# Patient Record
Sex: Female | Born: 1945
Health system: Southern US, Community
[De-identification: ages and names within clinical notes are randomized; demographics above are authoritative.]

## PROBLEM LIST (undated history)

## (undated) DIAGNOSIS — Z8042 Family history of malignant neoplasm of prostate: Secondary | ICD-10-CM

## (undated) DIAGNOSIS — T7840XA Allergy, unspecified, initial encounter: Secondary | ICD-10-CM

## (undated) DIAGNOSIS — M1990 Unspecified osteoarthritis, unspecified site: Secondary | ICD-10-CM

## (undated) DIAGNOSIS — F32A Depression, unspecified: Secondary | ICD-10-CM

## (undated) DIAGNOSIS — I509 Heart failure, unspecified: Secondary | ICD-10-CM

## (undated) DIAGNOSIS — G4730 Sleep apnea, unspecified: Secondary | ICD-10-CM

## (undated) DIAGNOSIS — N739 Female pelvic inflammatory disease, unspecified: Secondary | ICD-10-CM

## (undated) DIAGNOSIS — F419 Anxiety disorder, unspecified: Secondary | ICD-10-CM

## (undated) DIAGNOSIS — C55 Malignant neoplasm of uterus, part unspecified: Secondary | ICD-10-CM

## (undated) DIAGNOSIS — J690 Pneumonitis due to inhalation of food and vomit: Secondary | ICD-10-CM

## (undated) DIAGNOSIS — C800 Disseminated malignant neoplasm, unspecified: Secondary | ICD-10-CM

## (undated) DIAGNOSIS — I719 Aortic aneurysm of unspecified site, without rupture: Secondary | ICD-10-CM

## (undated) DIAGNOSIS — Z8049 Family history of malignant neoplasm of other genital organs: Secondary | ICD-10-CM

## (undated) DIAGNOSIS — E785 Hyperlipidemia, unspecified: Secondary | ICD-10-CM

## (undated) DIAGNOSIS — Z8719 Personal history of other diseases of the digestive system: Secondary | ICD-10-CM

## (undated) DIAGNOSIS — J449 Chronic obstructive pulmonary disease, unspecified: Secondary | ICD-10-CM

## (undated) DIAGNOSIS — F329 Major depressive disorder, single episode, unspecified: Secondary | ICD-10-CM

## (undated) DIAGNOSIS — Z803 Family history of malignant neoplasm of breast: Secondary | ICD-10-CM

## (undated) DIAGNOSIS — I341 Nonrheumatic mitral (valve) prolapse: Secondary | ICD-10-CM

## (undated) DIAGNOSIS — I10 Essential (primary) hypertension: Secondary | ICD-10-CM

## (undated) DIAGNOSIS — J9621 Acute and chronic respiratory failure with hypoxia: Secondary | ICD-10-CM

## (undated) HISTORY — DX: Aortic aneurysm of unspecified site, without rupture: I71.9

## (undated) HISTORY — DX: Major depressive disorder, single episode, unspecified: F32.9

## (undated) HISTORY — DX: Family history of malignant neoplasm of breast: Z80.3

## (undated) HISTORY — DX: Personal history of other diseases of the digestive system: Z87.19

## (undated) HISTORY — PX: CERVICAL SPINE SURGERY: SHX589

## (undated) HISTORY — DX: Malignant neoplasm of uterus, part unspecified: C55

## (undated) HISTORY — DX: Allergy, unspecified, initial encounter: T78.40XA

## (undated) HISTORY — DX: Nonrheumatic mitral (valve) prolapse: I34.1

## (undated) HISTORY — DX: Unspecified osteoarthritis, unspecified site: M19.90

## (undated) HISTORY — DX: Family history of malignant neoplasm of other genital organs: Z80.49

## (undated) HISTORY — PX: OTHER SURGICAL HISTORY: SHX169

## (undated) HISTORY — DX: Essential (primary) hypertension: I10

## (undated) HISTORY — DX: Depression, unspecified: F32.A

## (undated) HISTORY — DX: Anxiety disorder, unspecified: F41.9

## (undated) HISTORY — PX: ABDOMINAL HYSTERECTOMY: SHX81

## (undated) HISTORY — DX: Family history of malignant neoplasm of prostate: Z80.42

## (undated) HISTORY — PX: CEREBRAL ANEURYSM REPAIR: SHX164

---

## 1969-06-08 HISTORY — PX: PARTIAL HYSTERECTOMY: SHX80

## 2017-09-28 ENCOUNTER — Telehealth: Payer: Self-pay | Admitting: *Deleted

## 2017-09-28 NOTE — Telephone Encounter (Signed)
Called and spoke with Gerri at [facility], gave the appt for tomorrow at 1:15pm. She will call the patient

## 2017-09-29 ENCOUNTER — Inpatient Hospital Stay: Payer: Medicare PPO

## 2017-09-29 ENCOUNTER — Telehealth: Payer: Self-pay | Admitting: *Deleted

## 2017-09-29 ENCOUNTER — Encounter: Payer: Self-pay | Admitting: Oncology

## 2017-09-29 ENCOUNTER — Inpatient Hospital Stay: Payer: Medicare PPO | Attending: Obstetrics | Admitting: Obstetrics

## 2017-09-29 ENCOUNTER — Encounter: Payer: Self-pay | Admitting: *Deleted

## 2017-09-29 VITALS — BP 114/69 | HR 85 | Temp 98.6°F | Resp 20 | Ht 63.0 in | Wt 118.6 lb

## 2017-09-29 DIAGNOSIS — C801 Malignant (primary) neoplasm, unspecified: Secondary | ICD-10-CM | POA: Insufficient documentation

## 2017-09-29 DIAGNOSIS — C800 Disseminated malignant neoplasm, unspecified: Secondary | ICD-10-CM | POA: Diagnosis not present

## 2017-09-29 DIAGNOSIS — R1030 Lower abdominal pain, unspecified: Secondary | ICD-10-CM

## 2017-09-29 DIAGNOSIS — F17210 Nicotine dependence, cigarettes, uncomplicated: Secondary | ICD-10-CM | POA: Diagnosis not present

## 2017-09-29 DIAGNOSIS — C786 Secondary malignant neoplasm of retroperitoneum and peritoneum: Secondary | ICD-10-CM | POA: Insufficient documentation

## 2017-09-29 DIAGNOSIS — C569 Malignant neoplasm of unspecified ovary: Secondary | ICD-10-CM

## 2017-09-29 LAB — BASIC METABOLIC PANEL
Anion gap: 12 — ABNORMAL HIGH (ref 3–11)
BUN: 10 mg/dL (ref 7–26)
CHLORIDE: 99 mmol/L (ref 98–109)
CO2: 26 mmol/L (ref 22–29)
CREATININE: 0.74 mg/dL (ref 0.60–1.10)
Calcium: 9.2 mg/dL (ref 8.4–10.4)
GFR calc Af Amer: >60 mL/min (ref >60)
GFR calc non Af Amer: >60 mL/min (ref >60)
Glucose, Bld: 103 mg/dL (ref 70–140)
Potassium: 4 mmol/L (ref 3.5–5.1)
Sodium: 137 mmol/L (ref 136–145)

## 2017-09-29 LAB — PREALBUMIN: Prealbumin: 5.1 mg/dL — ABNORMAL LOW (ref 18–38)

## 2017-09-29 MED ORDER — HYDROCODONE-ACETAMINOPHEN 5-325 MG PO TABS: 1.0000 | ORAL_TABLET | Freq: Four times a day (QID) | ORAL | 0 refills | Status: DC | PRN

## 2017-09-29 NOTE — Patient Instructions (Addendum)
1. We will set up a biopsy of your omentum 2. You may keep your paracentesis appointment in [place]. 3. You will be set up to see Dr. Alvy Bimler for chemo discussion. 4. CT Scan is en-route for review. 5. RTC in next 2-3 weeks to followup on planning. We will try to get this in with Dr. Skeet Latch if possible.

## 2017-09-29 NOTE — Telephone Encounter (Signed)
Fax request for CD of scan to [facility] imaging

## 2017-09-30 ENCOUNTER — Ambulatory Visit
Admission: RE | Admit: 2017-09-30 | Discharge: 2017-09-30 | Disposition: A | Discharge status: Home / Self Care | Payer: Self-pay | Source: Ambulatory Visit | Attending: Gynecologic Oncology | Admitting: Gynecologic Oncology

## 2017-09-30 ENCOUNTER — Other Ambulatory Visit: Payer: Self-pay | Admitting: Gynecologic Oncology

## 2017-09-30 ENCOUNTER — Ambulatory Visit: Payer: PRIVATE HEALTH INSURANCE

## 2017-09-30 DIAGNOSIS — C569 Malignant neoplasm of unspecified ovary: Secondary | ICD-10-CM

## 2017-10-01 ENCOUNTER — Other Ambulatory Visit: Payer: Self-pay | Admitting: Gynecologic Oncology

## 2017-10-01 ENCOUNTER — Inpatient Hospital Stay
Admission: RE | Admit: 2017-10-01 | Discharge: 2017-10-01 | Disposition: A | Discharge status: Home / Self Care | Payer: Self-pay | Source: Ambulatory Visit | Attending: Gynecologic Oncology | Admitting: Gynecologic Oncology

## 2017-10-01 DIAGNOSIS — C801 Malignant (primary) neoplasm, unspecified: Secondary | ICD-10-CM

## 2017-10-04 ENCOUNTER — Telehealth: Payer: Self-pay | Admitting: *Deleted

## 2017-10-04 ENCOUNTER — Telehealth: Payer: Self-pay | Admitting: Obstetrics

## 2017-10-04 NOTE — Telephone Encounter (Signed)
Called and spoke with the patient, gave the appt with Dr. Alvy Bimler on May 3rd at 2pm.

## 2017-10-05 ENCOUNTER — Encounter: Payer: Self-pay | Admitting: Obstetrics

## 2017-10-05 DIAGNOSIS — C482 Malignant neoplasm of peritoneum, unspecified: Secondary | ICD-10-CM | POA: Insufficient documentation

## 2017-10-05 NOTE — Progress Notes (Signed)
Consult Note: Gyn-Onc  Consult was requested by Dr. Kayleen Memos in [place] for the evaluation of Jo Parker 72 y.o. female  CC:  Chief Complaint  Patient presents with  . malignant neoplasm of peritoneum    HPI: Jo Parker  is a very nice 72 y.o.  P3  March 2019 she had a abdominal ultrasound for her history of aortic aneurysm there was an incidental finding of a cirrhotic appearing liver and ascites.  Initially treated with diuretics but after lack of improvement further work-up was performed including a CT scan of the abdomen and pelvis.  As noted below etiology was significant carcinomatosis with peritoneal thickening sigmoid colon with significant thickening new pancreatic cyst compared to a 2018 scan and omental caking.  Seen by medical oncology [place] which is where she lives.  Paracentesis was ordered along with tumor markers including CA 125 which was noted to be elevated CA-19-9 which was mildly elevated normal being 35 the cytology on the paracentesis was positive for adenocarcinoma IHC staining suggested a GYN primary CK 7 and Ca1 25 being positive then CEA CK 20 and CA-19-9 being negative.  In retrospect the patient admits to approximately 5 months of symptoms.  Initially this presented as a pulled muscle when she has been noticing decreased appetite weight loss, noticed a significant abdominal distention.  About 1 month ago did have an episode of nausea and vomiting.  She does notice nausea about 1-2 times per week.   She is referred to [name] here in she has previously had medical procedures and stayed with these children. She presents with these 2 sons and their wives, her husband, and a nephew.  Measurement of disease:  No results for input(s): CA125, CAN125, CEA, CA199, ESTRADIOL, INHBB in the last 8760 hours.  Invalid input(s): INHIBINA  CA125 . 09/13/17 = 597.9    Radiology: . 08/18/2017-abdominal ultrasound-stable aneurysm 3.8 cm heterogeneous  liver likely cirrhosis and associated moderate ascites.  Bilateral . 09/03/2017--CT chest-[place]-small bilateral pleural effusions.  Upper abdomen shows large amount of ascites . 09/16/17 - CT AP - [place] -most of the pancreas looks normal.  Along the pancreatic tail there is a new 2.6 cm ovoid cystic mass.  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  Marked sigmoid wall thickening.  Ascites throughout the abdomen; perihepatic, perisplenic, paracolic, pelvic peripheral peritoneal thickening in the pelvis with marked omental caking.  Specifically no pelvic mass is noted.  The images were not available for review on the patient's initial visit    Oncologic History: as in HPI    No history exists.    Current Meds:  Outpatient Encounter Medications as of 09/29/2017  Medication Sig  . aspirin EC 81 MG tablet Take 81 mg by mouth daily.  Marland Kitchen atorvastatin (LIPITOR) 80 MG tablet Take 80 mg by mouth daily.  . budesonide-formoterol (SYMBICORT) 160-4.5 MCG/ACT inhaler Inhale 2 puffs into the lungs 2 (two) times daily. Use two puffs by mouth twice daily(rinse mouth after use)  . citalopram (CELEXA) 40 MG tablet Take 40 mg by mouth daily.  Marland Kitchen HYDROcodone-acetaminophen (NORCO/VICODIN) 5-325 MG tablet Take 1-2 tablets by mouth every 6 (six) hours as needed for moderate pain.  . meclizine (ANTIVERT) 25 MG tablet Take 25 mg by mouth 3 (three) times daily as needed for dizziness.  . Melatonin 5 MG CAPS Take 2 capsules by mouth at bedtime as needed (sleep). Take 2 capsules at bedtime as needed  . [DISCONTINUED] carvedilol (COREG)  12.5 MG tablet TK 1 T PO BID  . [DISCONTINUED] furosemide (LASIX) 20 MG tablet TK 1 T PO D UTD  . [DISCONTINUED] HYDROcodone-acetaminophen (NORCO/VICODIN) 5-325 MG tablet TK 1 TO 2 TS PO Q 6 H PRN P  . [DISCONTINUED] ondansetron (ZOFRAN) 4 MG tablet TK 1 T PO Q 6 H PRN N   No facility-administered encounter medications on file as of 09/29/2017.     Allergy: No Known  Allergies  Social Hx:   Social History   Socioeconomic History  . Marital status: Married    Spouse name: Not on file  . Number of children: Not on file  . Years of education: Not on file  . Highest education level: Not on file  Occupational History  . Not on file  Social Needs  . Financial resource strain: Not on file  . Food insecurity:    Worry: Not on file    Inability: Not on file  . Transportation needs:    Medical: Not on file    Non-medical: Not on file  Tobacco Use  . Smoking status: Current Some Day Smoker    Packs/day: 1.00    Years: 30.00    Pack years: 30.00  . Smokeless tobacco: Never Used  Substance and Sexual Activity  . Alcohol use: Yes    Comment: 1 / week  . Drug use: Never  . Sexual activity: Not on file  Lifestyle  . Physical activity:    Days per week: Not on file    Minutes per session: Not on file  . Stress: Not on file  Relationships  . Social connections:    Talks on phone: Not on file    Gets together: Not on file    Attends religious service: Not on file    Active member of club or organization: Not on file    Attends meetings of clubs or organizations: Not on file    Relationship status: Not on file  . Intimate partner violence:    Fear of current or ex partner: Not on file    Emotionally abused: Not on file    Physically abused: Not on file    Forced sexual activity: Not on file  Other Topics Concern  . Not on file  Social History Narrative  . Not on file    Past Surgical Hx:  Past Surgical History:  Procedure Laterality Date  . CEREBRAL ANEURYSM REPAIR    . CERVICAL SPINE SURGERY     bone spurs  . PARTIAL HYSTERECTOMY  1971   "precancer"    Past Medical Hx:  Past Medical History:  Diagnosis Date  . Anxiety and depression   . Aortic aneurysm ([place])   . Arthritis   . History of diverticulitis   . Hypercholesterolemia   . Hypertension   . Mitral valve prolapse     Past Gynecological History:   GYNECOLOGIC  HISTORY:  No LMP recorded. Menarche: 73 years old P 3 LMP 1977 Contraceptive OCP x8 years HRT none  Last Pap N/A  Family Hx:  Family History  Problem Relation Age of Onset  . Endometrial cancer Sister   . Lung cancer Paternal Aunt   . Breast cancer Paternal Aunt   . Breast cancer Paternal Aunt   . Stomach cancer Maternal Grandfather   . Prostate cancer Paternal Uncle   . Breast cancer Cousin   . Breast cancer Cousin     Review of Systems:  Review of Systems  Constitutional: Positive for appetite  change, chills, fatigue and unexpected weight change.  HENT:   Positive for tinnitus.   Eyes: Negative.   Respiratory: Positive for cough and shortness of breath.   Cardiovascular: Positive for palpitations.  Gastrointestinal: Positive for abdominal distention, abdominal pain, constipation, nausea and vomiting.  Endocrine: Negative.   Genitourinary: Positive for pelvic pain.   Musculoskeletal: Positive for back pain.  Skin: Negative.   Neurological: Negative.   Hematological: Negative.   Psychiatric/Behavioral: Positive for depression. The patient is nervous/anxious.     Vitals:  Blood pressure 114/69, pulse 85, temperature 98.6 F (37 C), temperature source Oral, resp. rate 20, height 5\' 3"  (1.6 m), weight 118 lb 9.6 oz (53.8 kg), SpO2 98 %. Body mass index is 21.01 kg/m.   Physical Exam:   General :  [place]  72 y.o., female in no apparent distress HEENT:  Normocephalic/atraumatic, symmetric, EOMI, eyelids normal Neck:   Supple, no masses.  Lymphatics:  No cervical/ submandibular/ supraclavicular/ infraclavicular/ inguinal adenopathy Respiratory:  Respirations unlabored, no use of accessory muscles CV:   Deferred Breast:  Deferred Musculoskeletal: No CVA tenderness, normal muscle strength. Abdomen:  Soft, non-tender, distended. No evidence of hernia. No masses. Extremities:  No lymphedema, no erythema, non-tender. Skin:   Normal inspection Neuro/Psych:  No focal  motor deficit, no abnormal mental status. Normal gait. Normal affect. Alert and oriented to person, place, and time  Genito Urinary: Vulva: Normal external female genitalia.  Bladder/urethra: Urethral meatus normal in size and location. No lesions or   masses, well supported bladder Bimanual exam:  Cervix/Uterus: surgically absent  Adnexa: No masses. Rectovaginal:  Good tone, mild narrowing of the rectum, + cul de sac nodularity starting at about 7cm from the anal verge consistent with disease along anterior recto-sigmoid. No mucosal breakthrough. No parametrial involvement or nodularity.  Oncologic Summary: 1. 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  Her genetic testing from [place] is pending. 2. Management discussion o We reviewed neoadjuvant versus primary debulking o With the images unavailable for review it is difficult to say with certainty whether primary debulking would be recommended however based on the patient's current disease description on the imaging report and her apparent malnourished state I would favor neoadjuvant approach. o We talked about the social constraints around this approach and the importance of coordinating timing of chemotherapy and surgery.  Her family would like her to stay in [place] to facilitate treatment. i. She may be able to complete the adjuvant portion of the chemo in [place]. I defer that decision/discussion to Dr. Alvy Bimler. o Although the cytology IHC staining favors a GYN primary I recommended a core biopsy of the omental cake to determine histology o She will be referred to Dr. Alvy Bimler for chemotherapy discussion o She was initially hoping to reconsult with Dr. Skeet Latch who treated a family  friend/relative 3. Pending items o CT images will be obtained from the outside facility and should the disease appear to be resectable with reasonable morbidity we may change the recommendation to be primary debulking o Prealbumin will be ordered.  I am concerned she is quite malnourished o Genetics from the outlying facility will need to be followed up. o Importantly a core biopsy will be ordered through interventional radiology 4. Symptom management  Her nausea is intermittent and occurs 1-2 times per  week.  At this time expectant management is reasonable however I do worry about her bowels in general I would like to expedite the visit to medical oncology.  Abdominal distention and ascites; a paracentesis has been ordered in [place] at this coming Friday and I think it would be reasonable to keep that appointment.  She may need another paracentesis here in [place] prior to her first chemotherapy treatment and I did ask the family to let us know if they would want [name] to order that for her 5. RTC 2-3 weeks with Dr. Skeet Latch or myself for continued counseling/coordination of care.  Isabel Caprice, MD  10/05/2017, 4:36 PM  Cc: Doran Clay, MD, [place] [facility]cologist Toney Reil, MD, [place]

## 2017-10-06 DIAGNOSIS — C800 Disseminated malignant neoplasm, unspecified: Secondary | ICD-10-CM

## 2017-10-06 HISTORY — DX: Disseminated malignant neoplasm, unspecified: C80.0

## 2017-10-07 ENCOUNTER — Telehealth: Payer: Self-pay

## 2017-10-07 ENCOUNTER — Other Ambulatory Visit: Payer: Self-pay | Admitting: Student

## 2017-10-07 NOTE — Telephone Encounter (Signed)
Told patient and son that the CT guided biopsy was moved to tomorrow 10-08-17 at [facility] radiology.  She needs to register in radiology at 0700. Dr Calton Dach appointment is at 2 pm tomorrow as previously scheduled. Pt and son verbalized understanding.

## 2017-10-08 ENCOUNTER — Encounter: Payer: Self-pay | Admitting: Hematology and Oncology

## 2017-10-08 ENCOUNTER — Encounter: Payer: Self-pay | Admitting: Oncology

## 2017-10-08 ENCOUNTER — Inpatient Hospital Stay: Payer: Medicare PPO | Attending: Hematology and Oncology | Admitting: Hematology and Oncology

## 2017-10-08 ENCOUNTER — Other Ambulatory Visit: Payer: Self-pay | Admitting: Hematology and Oncology

## 2017-10-08 ENCOUNTER — Encounter (HOSPITAL_COMMUNITY): Payer: Self-pay

## 2017-10-08 ENCOUNTER — Telehealth: Payer: Self-pay | Admitting: Hematology and Oncology

## 2017-10-08 ENCOUNTER — Telehealth: Payer: Self-pay | Admitting: Oncology

## 2017-10-08 ENCOUNTER — Ambulatory Visit (HOSPITAL_COMMUNITY)
Admission: RE | Admit: 2017-10-08 | Discharge: 2017-10-08 | Disposition: A | Discharge status: Home / Self Care | Payer: Medicare PPO | Source: Ambulatory Visit | Attending: Gynecologic Oncology | Admitting: Gynecologic Oncology

## 2017-10-08 DIAGNOSIS — R188 Other ascites: Secondary | ICD-10-CM | POA: Insufficient documentation

## 2017-10-08 DIAGNOSIS — Z7982 Long term (current) use of aspirin: Secondary | ICD-10-CM | POA: Insufficient documentation

## 2017-10-08 DIAGNOSIS — F17210 Nicotine dependence, cigarettes, uncomplicated: Secondary | ICD-10-CM | POA: Insufficient documentation

## 2017-10-08 DIAGNOSIS — D509 Iron deficiency anemia, unspecified: Secondary | ICD-10-CM | POA: Diagnosis not present

## 2017-10-08 DIAGNOSIS — K5909 Other constipation: Secondary | ICD-10-CM | POA: Insufficient documentation

## 2017-10-08 DIAGNOSIS — E7800 Pure hypercholesterolemia, unspecified: Secondary | ICD-10-CM | POA: Insufficient documentation

## 2017-10-08 DIAGNOSIS — I10 Essential (primary) hypertension: Secondary | ICD-10-CM | POA: Insufficient documentation

## 2017-10-08 DIAGNOSIS — C786 Secondary malignant neoplasm of retroperitoneum and peritoneum: Secondary | ICD-10-CM | POA: Diagnosis not present

## 2017-10-08 DIAGNOSIS — Z5111 Encounter for antineoplastic chemotherapy: Secondary | ICD-10-CM | POA: Insufficient documentation

## 2017-10-08 DIAGNOSIS — I341 Nonrheumatic mitral (valve) prolapse: Secondary | ICD-10-CM | POA: Insufficient documentation

## 2017-10-08 DIAGNOSIS — Z90710 Acquired absence of both cervix and uterus: Secondary | ICD-10-CM | POA: Diagnosis not present

## 2017-10-08 DIAGNOSIS — Z8042 Family history of malignant neoplasm of prostate: Secondary | ICD-10-CM | POA: Insufficient documentation

## 2017-10-08 DIAGNOSIS — F419 Anxiety disorder, unspecified: Secondary | ICD-10-CM | POA: Diagnosis not present

## 2017-10-08 DIAGNOSIS — F329 Major depressive disorder, single episode, unspecified: Secondary | ICD-10-CM | POA: Diagnosis not present

## 2017-10-08 DIAGNOSIS — C482 Malignant neoplasm of peritoneum, unspecified: Secondary | ICD-10-CM | POA: Diagnosis present

## 2017-10-08 DIAGNOSIS — Z720 Tobacco use: Secondary | ICD-10-CM | POA: Insufficient documentation

## 2017-10-08 DIAGNOSIS — E43 Unspecified severe protein-calorie malnutrition: Secondary | ICD-10-CM

## 2017-10-08 DIAGNOSIS — Z800 Family history of malignant neoplasm of digestive organs: Secondary | ICD-10-CM | POA: Diagnosis not present

## 2017-10-08 DIAGNOSIS — Z9889 Other specified postprocedural states: Secondary | ICD-10-CM | POA: Diagnosis not present

## 2017-10-08 DIAGNOSIS — R180 Malignant ascites: Secondary | ICD-10-CM

## 2017-10-08 DIAGNOSIS — Z803 Family history of malignant neoplasm of breast: Secondary | ICD-10-CM | POA: Insufficient documentation

## 2017-10-08 DIAGNOSIS — Z79899 Other long term (current) drug therapy: Secondary | ICD-10-CM | POA: Diagnosis not present

## 2017-10-08 DIAGNOSIS — C569 Malignant neoplasm of unspecified ovary: Secondary | ICD-10-CM | POA: Diagnosis not present

## 2017-10-08 DIAGNOSIS — G893 Neoplasm related pain (acute) (chronic): Secondary | ICD-10-CM | POA: Diagnosis not present

## 2017-10-08 DIAGNOSIS — Z801 Family history of malignant neoplasm of trachea, bronchus and lung: Secondary | ICD-10-CM | POA: Diagnosis not present

## 2017-10-08 DIAGNOSIS — R1030 Lower abdominal pain, unspecified: Secondary | ICD-10-CM

## 2017-10-08 LAB — CBC
HCT: 30.2 % — ABNORMAL LOW (ref 36.0–46.0)
Hemoglobin: 9.5 g/dL — ABNORMAL LOW (ref 12.0–15.0)
MCH: 19.1 pg — ABNORMAL LOW (ref 26.0–34.0)
MCHC: 31.5 g/dL (ref 30.0–36.0)
MCV: 60.8 fL — AB (ref 78.0–100.0)
PLATELETS: 503 10*3/uL — AB (ref 150–400)
RBC: 4.97 MIL/uL (ref 3.87–5.11)
RDW: 16 % — AB (ref 11.5–15.5)
WBC: 5.6 10*3/uL (ref 4.0–10.5)

## 2017-10-08 LAB — PROTIME-INR
INR: 1.22
Prothrombin Time: 15.3 seconds — ABNORMAL HIGH (ref 11.4–15.2)

## 2017-10-08 LAB — APTT: aPTT: 32 seconds (ref 24–36)

## 2017-10-08 MED ORDER — FENTANYL CITRATE (PF) 100 MCG/2ML IJ SOLN
INTRAMUSCULAR | Status: AC
  Filled 2017-10-08: qty 2

## 2017-10-08 MED ORDER — GELATIN ABSORBABLE 12-7 MM EX MISC
CUTANEOUS | Status: AC
  Filled 2017-10-08: qty 1

## 2017-10-08 MED ORDER — SODIUM CHLORIDE 0.9 % IV SOLN: INTRAVENOUS | Status: DC

## 2017-10-08 MED ORDER — NALOXONE HCL 0.4 MG/ML IJ SOLN
INTRAMUSCULAR | Status: AC
  Filled 2017-10-08: qty 1

## 2017-10-08 MED ORDER — FENTANYL CITRATE (PF) 100 MCG/2ML IJ SOLN
INTRAMUSCULAR | Status: AC | PRN
  Administered 2017-10-08 (×2): 25 ug via INTRAVENOUS

## 2017-10-08 MED ORDER — MIDAZOLAM HCL 2 MG/2ML IJ SOLN
INTRAMUSCULAR | Status: AC | PRN
  Administered 2017-10-08: 1 mg via INTRAVENOUS

## 2017-10-08 MED ORDER — HYDROCODONE-ACETAMINOPHEN 10-325 MG PO TABS: 1.0000 | ORAL_TABLET | ORAL | 0 refills | Status: DC | PRN

## 2017-10-08 MED ORDER — MIDAZOLAM HCL 2 MG/2ML IJ SOLN
INTRAMUSCULAR | Status: AC
  Filled 2017-10-08: qty 2

## 2017-10-08 MED ORDER — LIDOCAINE HCL 1 % IJ SOLN
INTRAMUSCULAR | Status: AC
  Filled 2017-10-08: qty 20

## 2017-10-08 MED ORDER — FLUMAZENIL 0.5 MG/5ML IV SOLN
INTRAVENOUS | Status: AC
  Filled 2017-10-08: qty 5

## 2017-10-08 MED FILL — HYDROCODON-APAP 10-325: 10-325 | 7 days supply | Qty: 60 | Fill #0

## 2017-10-08 NOTE — Sedation Documentation (Signed)
Patient is resting comfortably. 

## 2017-10-08 NOTE — Progress Notes (Signed)
START ON PATHWAY REGIMEN - Ovarian     A cycle is every 21 days:     Paclitaxel      Carboplatin   **Always confirm dose/schedule in your pharmacy ordering system**    Patient Characteristics: Newly Diagnosed, Neoadjuvant Therapy Therapeutic Status: Newly Diagnosed AJCC T Category: TX AJCC N Category: N0 AJCC M Category: M1 AJCC 8 Stage Grouping: IV BRCA Mutation Status: Did Not Order Test Intent of Therapy: Curative Intent, Discussed with Patient

## 2017-10-08 NOTE — Assessment & Plan Note (Signed)
She has poorly controlled cancer pain I recommend increasing the dose of her pain medicine and the frequency I warned her about risk of sedation, nausea and constipation I will reassess pain control next week

## 2017-10-08 NOTE — Assessment & Plan Note (Signed)
She have severe iron deficiency anemia She will benefit from iron replacement therapy next week

## 2017-10-08 NOTE — Assessment & Plan Note (Signed)
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

## 2017-10-08 NOTE — Assessment & Plan Note (Signed)
I spent some time counseling the patient the importance of tobacco cessation.  she appears motivated to quit.  

## 2017-10-08 NOTE — Assessment & Plan Note (Addendum)
She have severe protein calorie malnutrition due to untreated malignancy I will consult dietitian for visit We discussed frequent small meals

## 2017-10-08 NOTE — Discharge Instructions (Addendum)
Needle Biopsy, Care After °Refer to this sheet in the next few weeks. These instructions provide you with information about caring for yourself after your procedure. Your health care provider may also give you more specific instructions. Your treatment has been planned according to current medical practices, but problems sometimes occur. Call your health care provider if you have any problems or questions after your procedure. °What can I expect after the procedure? °After your procedure, it is common to have soreness, bruising, or mild pain at the biopsy site. This should go away in a few days. °Follow these instructions at home: °· Rest as directed by your health care provider. °· Take medicines only as directed by your health care provider. °· There are many different ways to close and cover the biopsy site, including stitches (sutures), skin glue, and adhesive strips. Follow your health care provider's instructions about: °? Biopsy site care. °? Bandage (dressing) changes and removal. °? Biopsy site closure removal. °· Check your biopsy site every day for signs of infection. Watch for: °? Redness, swelling, or pain. °? Fluid, blood, or pus. °Contact a health care provider if: °· You have a fever. °· You have redness, swelling, or pain at the biopsy site that lasts longer than a few days. °· You have fluid, blood, or pus coming from the biopsy site. °· You feel nauseous. °· You vomit. °Get help right away if: °· You have shortness of breath. °· You have trouble breathing. °· You have chest pain. °· You feel dizzy or you faint. °· You have bleeding that does not stop with pressure or a bandage. °· You cough up blood. °· You have pain in your abdomen. °This information is not intended to replace advice given to you by your health care provider. Make sure you discuss any questions you have with your health care provider. °Document Released: 10/09/2014 Document Revised: 10/31/2015 Document Reviewed:  05/21/2014 °Elsevier Interactive Patient Education © 2018 [employer] °Moderate Conscious Sedation, Adult, Care After °These instructions provide you with information about caring for yourself after your procedure. Your health care provider may also give you more specific instructions. Your treatment has been planned according to current medical practices, but problems sometimes occur. Call your health care provider if you have any problems or questions after your procedure. °What can I expect after the procedure? °After your procedure, it is common: °· To feel sleepy for several hours. °· To feel clumsy and have poor balance for several hours. °· To have poor judgment for several hours. °· To vomit if you eat too soon. ° °Follow these instructions at home: °For at least 24 hours after the procedure: ° °· Do not: °? Participate in activities where you could fall or become injured. °? Drive. °? Use heavy machinery. °? Drink alcohol. °? Take sleeping pills or medicines that cause drowsiness. °? Make important decisions or sign legal documents. °? Take care of children on your own. °· Rest. °Eating and drinking °· Follow the diet recommended by your health care provider. °· If you vomit: °? Drink water, juice, or soup when you can drink without vomiting. °? Make sure you have little or no nausea before eating solid foods. °General instructions °· Have a responsible adult stay with you until you are awake and alert. °· Take over-the-counter and prescription medicines only as told by your health care provider. °· If you smoke, do not smoke without supervision. °· Keep all follow-up visits as told by your health care   provider. This is important. °Contact a health care provider if: °· You keep feeling nauseous or you keep vomiting. °· You feel light-headed. °· You develop a rash. °· You have a fever. °Get help right away if: °· You have trouble breathing. °This information is not intended to replace advice given to you  by your health care provider. Make sure you discuss any questions you have with your health care provider. °Document Released: 03/15/2013 Document Revised: 10/28/2015 Document Reviewed: 09/14/2015 °Elsevier Interactive Patient Education © 2018 [employer] Inc[employer] ° °

## 2017-10-08 NOTE — Telephone Encounter (Signed)
Called [facility] radiology and spoke to [place].  Porta cath insertion with paracentesis was scheduled on 10/14/17 at 8 am.  Patient to be at [place] stay at 6:30 am and NPO after midnight.   Called patient's son, [name] and advised him of appointment time for 10/14/17.  He verbalized understanding and agreement.

## 2017-10-08 NOTE — Procedures (Signed)
Interventional Radiology Procedure Note  Procedure: CT guided omental biopsy  Complications: None  Estimated Blood Loss: None  Findings: Omental/peritoneal mass sampled via 17 G needle with 18 G core biopsy x 3.  No complications.  Venetia Night. Kathlene Cote, M.D Pager:  [phone]

## 2017-10-08 NOTE — Assessment & Plan Note (Addendum)
She has malignant ascites Currently, she is symptomatic I will arrange for therapeutic paracentesis

## 2017-10-08 NOTE — Progress Notes (Signed)
[facility] CONSULT NOTE  Patient Care Team: Patient, [name] Per as PCP - General (General Practice)  ASSESSMENT & PLAN:  Primary peritoneal carcinomatosis ([place]) I shared my concern with the patient and family in regards to recent pathology report Biopsy from omentum is still pending I plan to see her back next week to review test results and final plan of care Regardless of final pathology, she will need systemic chemotherapy The cystic mass seen on CT could represent benign lesion versus pancreatic cancer If biopsy report confirmed diagnosis of GYN malignancy or primary peritoneal carcinoma, I recommend chemotherapy with carboplatin and Taxol However, if biopsy report confirmed GI source of malignancy, I will treat her with 5-FU or gemcitabine based chemotherapy I will arrange for port placement in the meantime along with chemo education class next week  Iron deficiency anemia She have severe iron deficiency anemia She will benefit from iron replacement therapy next week  Malignant ascites She has malignant ascites Currently, she is symptomatic I will arrange for therapeutic paracentesis   Severe protein-calorie malnutrition ([place]) She have severe protein calorie malnutrition due to untreated malignancy I will consult dietitian for visit We discussed frequent small meals  Cancer associated pain She has poorly controlled cancer pain I recommend increasing the dose of her pain medicine and the frequency I warned her about risk of sedation, nausea and constipation I will reassess pain control next week  Tobacco abuse I spent some time counseling the patient the importance of tobacco cessation. she appears motivated to quit    Other constipation She has constipation secondary to carcinomatosis I recommend regular laxatives   Orders Placed This Encounter  Procedures  . IR FLUORO GUIDE PORT INSERTION RIGHT    Standing Status:   Future    Standing Expiration  Date:   12/09/2018    Order Specific Question:   Reason for Exam (SYMPTOM  OR DIAGNOSIS REQUIRED)    Answer:   need port for chemo to start 5/13    Order Specific Question:   Preferred Imaging Location?    Answer:   [facility]  . IR Paracentesis    Standing Status:   Future    Standing Expiration Date:   12/09/2018    Order Specific Question:   If therapeutic, is there a maximum amount of fluid to be removed?    Answer:   Yes    Comments:   5 liters    Order Specific Question:   What is the maximum amount of fluide to be removed?    Answer:   5 liters    Order Specific Question:   Are labs required for specimen collection?    Answer:   No    Order Specific Question:   Is Albumin medication needed?    Answer:   No    Order Specific Question:   Reason for Exam (SYMPTOM  OR DIAGNOSIS REQUIRED)    Answer:   therapeutic paracentesis    Order Specific Question:   Preferred Imaging Location?    Answer:   [facility]     CHIEF COMPLAINTS/PURPOSE OF CONSULTATION:  Carcinomatosis, worrisome for primary GYN malignancy  HISTORY OF PRESENTING ILLNESS:  Jo Parker 72 y.o. female is here because of abdominal carcinomatosis I have reviewed outside records extensively The patient have interesting history of uterine cancer at the age of 48 status post hysterectomy She has 3 sons prior to hysterectomy She denies exposure to birth control pill or hormone replacement  therapy She has 11 siblings of which her own sister, 2 paternal aunts and several cousins have either uterine cancer or ovarian cancer.  Her father died from lung cancer The patient retired in [place] but has relocated to [place] recently to be with her sons to get chemotherapy for new cancer [name] and [name] were present.  Jo Parker, her daughter-in-law who is married to [name] is also present Her older son, Dominica Severin is not present and he is currently in [place]. The patient has been smoking since the age of 20,  currently 1 to 2 cigarettes/day She started to have progressive abdominal distention since February along with changes in bowel habits with reduced caliber of stool and new onset of constipation She started to have a palpable mass in the left lower quadrant causing pain. She denies vaginal bleeding. The patient had extensive evaluation elsewhere which I have reviewed leading to therapeutic paracentesis and positive fluid cytology worrisome for primary GYN malignancy I have reviewed her chart and materials related to her cancer extensively and collaborated history with the patient. Summary of oncologic history is as follows:   Primary peritoneal carcinomatosis ([place])   08/18/2017 Imaging    US imaging showed scular AAA measured 3.8 cm, liver nodularity with ascites and pleural effusions      09/03/2017 Imaging    CT chest elsewhere showed small bilateral pleural effusion      09/13/2017 Pathology Results    Fluid cytology positive for adenocarcinoma, strongly positive for CK7, CA-125, moderate to strong MOC-31 and B72.3. 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      10/08/2017 Procedure    Extensive omental and peritoneal tumor is present anteriorly deep to the abdominal wall. From a midline approach, tumor was sampled just to the left of midline in the upper pelvis, below the level of the umbilicus. Solid tissue was obtained.  IMPRESSION: CT-guided core biopsy performed of omental/peritoneal tumor in the anterior peritoneal cavity.      Biopsy from this morning is pending. She has chronic shortness of breath, exacerbated by presence of ascites.  She have 2-3  pillow orthopnea over the past few months She continues to smoke She denies chest pain She denies lower extremity edema The patient denies any recent signs or symptoms of bleeding such as spontaneous epistaxis, hematuria or hematochezia. She has lost a lot of weight.  Her baseline weight is around 130 pounds  MEDICAL HISTORY:  Past Medical History:  Diagnosis Date  . Anxiety and depression   . Aortic aneurysm ([place])   . Arthritis   . History of diverticulitis   . Hypercholesterolemia   . Hypertension   . Mitral valve prolapse   . Uterine cancer ([place])    had partial hysterectomy at 27    SURGICAL HISTORY: Past Surgical History:  Procedure Laterality Date  . ABDOMINAL HYSTERECTOMY    . cardiac monitor implant    . CEREBRAL ANEURYSM REPAIR    . CERVICAL SPINE SURGERY     bone spurs  . PARTIAL HYSTERECTOMY  1971   "precancer"    SOCIAL HISTORY: Social History   Socioeconomic History  . Marital status: Married    Spouse name: Not on file  .  Number of children: 3  . Years of education: Not on file  . Highest education level: Not on file  Occupational History  . Occupation: retired  Scientific laboratory technician  . Financial resource strain: Not on file  . Food insecurity:    Worry: Not on file    Inability: Not on file  . Transportation needs:    Medical: Not on file    Non-medical: Not on file  Tobacco Use  . Smoking status: Current Some Day Smoker    Packs/day: 1.00    Years: 55.00    Pack years: 55.00  . Smokeless tobacco: Never Used  Substance and Sexual Activity  . Alcohol use: Yes    Comment: 1 / week  . Drug use: Never  . Sexual activity: Not on file  Lifestyle  . Physical activity:    Days per week: Not on file    Minutes per session: Not on file  . Stress: Not on file  Relationships  . Social connections:    Talks on phone: Not on file    Gets together: Not on file    Attends religious service: Not on file    Active member of club or organization: Not on file     Attends meetings of clubs or organizations: Not on file    Relationship status: Not on file  . Intimate partner violence:    Fear of current or ex partner: Not on file    Emotionally abused: Not on file    Physically abused: Not on file    Forced sexual activity: Not on file  Other Topics Concern  . Not on file  Social History Narrative  . Not on file    FAMILY HISTORY: Family History  Problem Relation Age of Onset  . Endometrial cancer Sister 57       endometrial ca  . Lung cancer Paternal Aunt   . Breast cancer Paternal Aunt   . Breast cancer Paternal Aunt   . Stomach cancer Maternal Grandfather   . Prostate cancer Paternal Uncle   . Breast cancer Cousin   . Breast cancer Cousin   . Lung cancer Father        lung ca    ALLERGIES:  has No Known Allergies.  MEDICATIONS:  Current Outpatient Medications  Medication Sig Dispense Refill  . aspirin EC 81 MG tablet Take 81 mg by mouth daily.    Marland Kitchen atorvastatin (LIPITOR) 80 MG tablet Take 80 mg by mouth daily.    . budesonide-formoterol (SYMBICORT) 160-4.5 MCG/ACT inhaler Inhale 2 puffs into the lungs 2 (two) times daily. Use two puffs by mouth twice daily(rinse mouth after use)    . carvedilol (COREG) 12.5 MG tablet Take 12.5 mg by mouth 2 (two) times daily with a meal.    . citalopram (CELEXA) 40 MG tablet Take 40 mg by mouth daily.    . furosemide (LASIX) 20 MG tablet Take 20 mg by mouth daily.    Marland Kitchen HYDROcodone-acetaminophen (NORCO) 10-325 MG tablet Take 1 tablet by mouth every 3 (three) hours as needed. 60 tablet 0  . lubiprostone (AMITIZA) 8 MCG capsule Take 8 mcg by mouth 2 (two) times daily as needed for constipation.    . meclizine (ANTIVERT) 25 MG tablet Take 25 mg by mouth 3 (three) times daily as needed for dizziness.    . Melatonin 5 MG CAPS Take 2 capsules by mouth at bedtime as needed (sleep). Take 2 capsules at bedtime as needed    .  ondansetron (ZOFRAN) 4 MG tablet Take 4 mg by mouth every 6 (six) hours as  needed for nausea or vomiting.    . polyethylene glycol (MIRALAX / GLYCOLAX) packet Take 17 g by mouth daily as needed.     No current facility-administered medications for this visit.    Facility-Administered Medications Ordered in Other Visits  Medication Dose Route Frequency Provider Last Rate Last Dose  . 0.9 %  sodium chloride infusion   Intravenous Continuous Brynda Greathouse Sue-Ellen, PA      . fentaNYL (SUBLIMAZE) 100 MCG/2ML injection           . lidocaine (XYLOCAINE) 1 % (with pres) injection           . midazolam (VERSED) 2 MG/2ML injection             REVIEW OF SYSTEMS:   Constitutional: Denies fevers, chills  Eyes: Denies blurriness of vision, double vision or watery eyes Ears, nose, mouth, throat, and face: Denies mucositis or sore throat Cardiovascular: Denies palpitation, chest discomfort or lower extremity swelling Skin: Denies abnormal skin rashes Lymphatics: Denies new lymphadenopathy or easy bruising Neurological:Denies numbness, tingling or new weaknesses Behavioral/Psych: Mood is stable, no new changes  All other systems were reviewed with the patient and are negative.  PHYSICAL EXAMINATION: ECOG PERFORMANCE STATUS: 2 - Symptomatic, <50% confined to bed  Vitals:   10/08/17 1430  BP: 135/76  Pulse: 94  Resp: 18  Temp: 98.4 F (36.9 C)  SpO2: 97%   Filed Weights   10/08/17 1430  Weight: 115 lb 4.8 oz (52.3 kg)    GENERAL:alert, no distress and comfortable.  She looks cachectic with muscle wasting SKIN: skin color, texture, turgor are normal, no rashes or significant lesions EYES: normal, conjunctiva are pink and non-injected, sclera clear OROPHARYNX:no exudate, no erythema and lips, buccal mucosa, and tongue normal  NECK: supple, thyroid normal size, non-tender, without nodularity LYMPH:  no palpable lymphadenopathy in the cervical, axillary or inguinal LUNGS: clear to auscultation and percussion with normal breathing effort HEART: regular rate &  rhythm and no murmurs and no lower extremity edema ABDOMEN:abdomen soft, distended with ascites with palpable omentum caking with pain on the left lower quadrant without rebound or guarding Musculoskeletal:no cyanosis of digits and no clubbing  PSYCH: alert & oriented x 3 with fluent speech NEURO: no focal motor/sensory deficits  LABORATORY DATA:  I have reviewed the data as listed Lab Results  Component Value Date   WBC 5.6 10/08/2017   HGB 9.5 (L) 10/08/2017   HCT 30.2 (L) 10/08/2017   MCV 60.8 (L) 10/08/2017   PLT 503 (H) 10/08/2017   Recent Labs    09/29/17 1604  NA 137  K 4.0  CL 99  CO2 26  GLUCOSE 103  BUN 10  CREATININE 0.74  CALCIUM 9.2  GFRNONAA >60  GFRAA >60    RADIOGRAPHIC STUDIES: I have also review outside CT imaging I have personally reviewed the radiological images as listed and agreed with the findings in the report. Ct Biopsy  Result Date: 10/08/2017 CLINICAL DATA:  Ascites, omental and peritoneal carcinomatosis and suspicion metastatic ovarian carcinoma. Core biopsy of omental tumor has been requested for tissue diagnosis. EXAM: CT GUIDED CORE BIOPSY OF OMENTAL/PERITONEAL TUMOR ANESTHESIA/SEDATION: 1.0 mg IV Versed; 50 mcg IV Fentanyl Total Moderate Sedation Time:  18 minutes. The patient's level of consciousness and physiologic status were continuously monitored during the procedure by Radiology nursing. PROCEDURE: The procedure risks, benefits, and alternatives were explained to  the patient. Questions regarding the procedure were encouraged and answered. The patient understands and consents to the procedure. A time-out was performed prior to initiating the procedure. CT was performed in a supine position through the abdomen and pelvis. The lower abdominal wall was prepped with chlorhexidine in a sterile fashion, and a sterile drape was applied covering the operative field. A sterile gown and sterile gloves were used for the procedure. Local anesthesia was  provided with 1% Lidocaine. A 17 gauge needle was advanced in the midline lower abdominal wall at the level of the pelvis to the level omental tumor. Coaxial 18 gauge core biopsy samples were obtained. Three separate core biopsy samples were obtained and submitted in formalin. Additional CT images were performed after outer needle removal. COMPLICATIONS: None FINDINGS: Extensive omental and peritoneal tumor is present anteriorly deep to the abdominal wall. From a midline approach, tumor was sampled just to the left of midline in the upper pelvis, below the level of the umbilicus. Solid tissue was obtained. IMPRESSION: CT-guided core biopsy performed of omental/peritoneal tumor in the anterior peritoneal cavity. Electronically Signed   By: Aletta Edouard M.D.   On: 10/08/2017 11:25   Ct Outside Films Body  Result Date: 10/01/2017 This examination belongs to an outside facility and is stored here for comparison purposes only.  Contact the originating outside institution for any associated report or interpretation.   I spent 60 minutes counseling the patient face to face. The total time spent in the appointment was 80 minutes and more than 50% was on counseling.  All questions were answered. The patient knows to call the clinic with any problems, questions or concerns.  [name], MD 10/08/2017 3:19 PM

## 2017-10-08 NOTE — Progress Notes (Signed)
  Oncology Nurse Navigator Documentation  Navigator Location: [facility] (10/08/17 1556) Referral date to RadOnc/MedOnc: 09/29/17 (10/08/17 1556) )Navigator Encounter Type: Initial MedOnc (10/08/17 1556)                     Patient Visit Type: MedOnc (10/08/17 1556) Treatment Phase: Pre-Tx/Tx Discussion (10/08/17 1556) Barriers/Navigation Needs: Education;Coordination of Care (10/08/17 1556) Education: Preparing for Upcoming Surgery/ Treatment (10/08/17 1556) Interventions: Coordination of Care (10/08/17 1556)   Coordination of Care: Appts;Radiology (10/08/17 1556)        Acuity: Level 2 (10/08/17 1556)   Acuity Level 2: Ongoing guidance and education throughout treatment as needed (10/08/17 1556)     Time Spent with Patient: 15 (10/08/17 1556)   Met with patient during Dr. Calton Dach appointment.  Discussed arranging porta cath and paracentesis on the same day next week if possible.

## 2017-10-08 NOTE — H&P (Signed)
Chief Complaint: Patient was seen in consultation today for omental mass biopsy at the request of Cross,Melissa D  Referring Physician(s): Caribou D  Supervising Physician: Aletta Edouard  Patient Status: [facility] - Out-pt  History of Present Illness: Jo Parker is a 72 y.o. female   4/24 note: March 2019 she had a abdominal ultrasound for her history of aortic aneurysm there was an incidental finding of a cirrhotic appearing liver and ascites.  Initially treated with diuretics but after lack of improvement further work-up was performed including a CT scan of the abdomen and pelvis.  As noted below etiology was significant carcinomatosis with peritoneal thickening sigmoid colon with significant thickening new pancreatic cyst compared to a 2018 scan and omental caking.  Seen by medical oncology [place] which is where she lives.  Paracentesis was ordered along with tumor markers including CA 125 which was noted to be elevated CA-19-9 which was mildly elevated normal being 35 the cytology on the paracentesis was positive for adenocarcinoma IHC staining suggested a GYN primary CK 7 and Ca1 25 being positive then CEA CK 20 and CA-19-9 being negative    08/18/2017-abdominal ultrasound-stable aneurysm 3.8 cm heterogeneous liver likely cirrhosis and associated moderate ascites.  Bilateral  09/03/2017--CT chest-[place]-small bilateral pleural effusions.  Upper abdomen shows large amount of ascites  09/16/17 - CT AP - [place] -most of the pancreas looks normal.  Along the pancreatic tail there is a new 2.6 cm ovoid cystic mass.  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  Marked sigmoid wall thickening.  Ascites throughout the abdomen; perihepatic, perisplenic, paracolic, pelvic peripheral peritoneal thickening in the pelvis with marked omental caking.  Specifically no pelvic mass is noted.  The images were not available for review on the patient's initial  visit  Scheduled now for omental mass biopsy and possible paracentesis     Past Medical History:  Diagnosis Date  . Anxiety and depression   . Aortic aneurysm ([place])   . Arthritis   . History of diverticulitis   . Hypercholesterolemia   . Hypertension   . Mitral valve prolapse     Past Surgical History:  Procedure Laterality Date  . CEREBRAL ANEURYSM REPAIR    . CERVICAL SPINE SURGERY     bone spurs  . PARTIAL HYSTERECTOMY  1971   "precancer"    Allergies: Patient has no known allergies.  Medications: Prior to Admission medications   Medication Sig Start Date End Date Taking? Authorizing Provider  aspirin EC 81 MG tablet Take 81 mg by mouth daily.   Yes [provider]  atorvastatin (LIPITOR) 80 MG tablet Take 80 mg by mouth daily.   Yes [provider]  budesonide-formoterol (SYMBICORT) 160-4.5 MCG/ACT inhaler Inhale 2 puffs into the lungs 2 (two) times daily. Use two puffs by mouth twice daily(rinse mouth after use)   Yes [provider]  carvedilol (COREG) 12.5 MG tablet Take 12.5 mg by mouth 2 (two) times daily with a meal.   Yes [provider]  citalopram (CELEXA) 40 MG tablet Take 40 mg by mouth daily.   Yes [provider]  furosemide (LASIX) 20 MG tablet Take 20 mg by mouth daily.   Yes [provider]  HYDROcodone-acetaminophen (NORCO/VICODIN) 5-325 MG tablet Take 1-2 tablets by mouth every 6 (six) hours as needed for moderate pain. 09/29/17  Yes Cross, Melissa D, NP  lubiprostone (AMITIZA) 8 MCG capsule Take 8 mcg by mouth 2 (two) times daily as needed  for constipation.   Yes [provider]  Melatonin 5 MG CAPS Take 2 capsules by mouth at bedtime as needed (sleep). Take 2 capsules at bedtime as needed   Yes [provider]  ondansetron (ZOFRAN) 4 MG tablet Take 4 mg by mouth every 6 (six) hours as needed for nausea or vomiting.   Yes [provider]  polyethylene glycol (MIRALAX /  GLYCOLAX) packet Take 17 g by mouth daily as needed.   Yes [provider]  meclizine (ANTIVERT) 25 MG tablet Take 25 mg by mouth 3 (three) times daily as needed for dizziness.    [provider]     Family History  Problem Relation Age of Onset  . Endometrial cancer Sister   . Lung cancer Paternal Aunt   . Breast cancer Paternal Aunt   . Breast cancer Paternal Aunt   . Stomach cancer Maternal Grandfather   . Prostate cancer Paternal Uncle   . Breast cancer Cousin   . Breast cancer Cousin     Social History   Socioeconomic History  . Marital status: Married    Spouse name: Not on file  . Number of children: Not on file  . Years of education: Not on file  . Highest education level: Not on file  Occupational History  . Not on file  Social Needs  . Financial resource strain: Not on file  . Food insecurity:    Worry: Not on file    Inability: Not on file  . Transportation needs:    Medical: Not on file    Non-medical: Not on file  Tobacco Use  . Smoking status: Current Some Day Smoker    Packs/day: 1.00    Years: 30.00    Pack years: 30.00  . Smokeless tobacco: Never Used  Substance and Sexual Activity  . Alcohol use: Yes    Comment: 1 / week  . Drug use: Never  . Sexual activity: Not on file  Lifestyle  . Physical activity:    Days per week: Not on file    Minutes per session: Not on file  . Stress: Not on file  Relationships  . Social connections:    Talks on phone: Not on file    Gets together: Not on file    Attends religious service: Not on file    Active member of club or organization: Not on file    Attends meetings of clubs or organizations: Not on file    Relationship status: Not on file  Other Topics Concern  . Not on file  Social History Narrative  . Not on file    Review of Systems: A 12 point ROS discussed and pertinent positives are indicated in the HPI above.  All other systems are negative.  Review of Systems   Constitutional: Positive for activity change, appetite change, fatigue and unexpected weight change.  Respiratory: Negative for cough and shortness of breath.   Cardiovascular: Negative for chest pain.  Gastrointestinal: Positive for abdominal distention and abdominal pain.  Neurological: Positive for weakness.  Psychiatric/Behavioral: Negative for behavioral problems and confusion.    Vital Signs: BP 129/80   Pulse 85   Temp 98 F (36.7 C) (Oral)   Resp 16   Ht 5\' 3"  (1.6 m)   Wt 118 lb (53.5 kg)   SpO2 95%   BMI 20.90 kg/m   Physical Exam  Constitutional: She is oriented to person, place, and time.  Cardiovascular: Regular rhythm.  Pulmonary/Chest: Effort normal  and breath sounds normal.  Abdominal: Soft. Bowel sounds are normal. She exhibits distension. There is no tenderness.  Musculoskeletal: Normal range of motion.  Neurological: She is alert and oriented to person, place, and time.  Skin: Skin is warm and dry.  Psychiatric: She has a normal mood and affect. Her behavior is normal. Judgment and thought content normal.  Nursing note and vitals reviewed.   Imaging: Ct Outside Films Body  Result Date: 10/01/2017 This examination belongs to an outside facility and is stored here for comparison purposes only.  Contact the originating outside institution for any associated report or interpretation.   Labs:  CBC: Recent Labs    10/08/17 0710  WBC 5.6  HGB 9.5*  HCT 30.2*  PLT 503*    COAGS: No results for input(s): INR, APTT in the last 8760 hours.  BMP: Recent Labs    09/29/17 1604  NA 137  K 4.0  CL 99  CO2 26  GLUCOSE 103  BUN 10  CALCIUM 9.2  CREATININE 0.74  GFRNONAA >60  GFRAA >60    LIVER FUNCTION TESTS: No results for input(s): BILITOT, AST, ALT, ALKPHOS, PROT, ALBUMIN in the last 8760 hours.  TUMOR MARKERS: No results for input(s): AFPTM, CEA, CA199, CHROMGRNA in the last 8760 hours.  Assessment and Plan:  Omental mass Ascites: +  adenocarcinoma IHC staining suggested a GYN primary For omental mass biopsy and possible paracentesis Risks and benefits discussed with the patient including, but not limited to bleeding, infection, damage to adjacent structures or low yield requiring additional tests.  All of the patient's questions were answered, patient is agreeable to proceed. Consent signed and in chart.   Thank you for this interesting consult.  I greatly enjoyed meeting Jahna Liebert and look forward to participating in their care.  A copy of this report was sent to the requesting provider on this date.  Electronically Signed: Lavonia Drafts, PA-C 10/08/2017, 7:42 AM   I spent a total of  30 Minutes   in face to face in clinical consultation, greater than 50% of which was counseling/coordinating care for omental mass bx/paracentesis

## 2017-10-08 NOTE — Telephone Encounter (Signed)
Gave patient AVs and calendar of upcoming may appointments.  °

## 2017-10-08 NOTE — Assessment & Plan Note (Signed)
I shared my concern with the patient and family in regards to recent pathology report Biopsy from omentum is still pending I plan to see her back next week to review test results and final plan of care Regardless of final pathology, she will need systemic chemotherapy The cystic mass seen on CT could represent benign lesion versus pancreatic cancer If biopsy report confirmed diagnosis of GYN malignancy or primary peritoneal carcinoma, I recommend chemotherapy with carboplatin and Taxol However, if biopsy report confirmed GI source of malignancy, I will treat her with 5-FU or gemcitabine based chemotherapy I will arrange for port placement in the meantime along with chemo education class next week

## 2017-10-08 NOTE — Telephone Encounter (Signed)
Left a message with Tiffany at [facility] [phone]) to schedule porta cath and paracentesis on the same day next week.  Requested a return call.

## 2017-10-11 ENCOUNTER — Other Ambulatory Visit: Payer: Self-pay | Admitting: Hematology and Oncology

## 2017-10-11 DIAGNOSIS — C482 Malignant neoplasm of peritoneum, unspecified: Secondary | ICD-10-CM

## 2017-10-11 DIAGNOSIS — D509 Iron deficiency anemia, unspecified: Secondary | ICD-10-CM

## 2017-10-11 DIAGNOSIS — D539 Nutritional anemia, unspecified: Secondary | ICD-10-CM

## 2017-10-12 ENCOUNTER — Other Ambulatory Visit: Payer: Self-pay | Admitting: Radiology

## 2017-10-12 ENCOUNTER — Ambulatory Visit (HOSPITAL_COMMUNITY)
Admission: RE | Admit: 2017-10-12 | Discharge: 2017-10-12 | Disposition: A | Discharge status: Home / Self Care | Payer: PRIVATE HEALTH INSURANCE | Source: Ambulatory Visit | Attending: Gynecologic Oncology | Admitting: Gynecologic Oncology

## 2017-10-13 ENCOUNTER — Encounter: Payer: Self-pay | Admitting: Hematology and Oncology

## 2017-10-13 ENCOUNTER — Other Ambulatory Visit: Payer: Self-pay | Admitting: Student

## 2017-10-13 ENCOUNTER — Inpatient Hospital Stay: Payer: Medicare PPO

## 2017-10-13 ENCOUNTER — Other Ambulatory Visit: Payer: Self-pay | Admitting: Hematology and Oncology

## 2017-10-13 ENCOUNTER — Inpatient Hospital Stay (HOSPITAL_BASED_OUTPATIENT_CLINIC_OR_DEPARTMENT_OTHER): Payer: Medicare PPO | Admitting: Hematology and Oncology

## 2017-10-13 ENCOUNTER — Telehealth: Payer: Self-pay | Admitting: Hematology and Oncology

## 2017-10-13 ENCOUNTER — Encounter: Payer: Self-pay | Admitting: *Deleted

## 2017-10-13 VITALS — BP 133/73 | HR 90 | Temp 97.6°F | Resp 18 | Ht 63.0 in | Wt 115.8 lb

## 2017-10-13 DIAGNOSIS — G893 Neoplasm related pain (acute) (chronic): Secondary | ICD-10-CM

## 2017-10-13 DIAGNOSIS — E43 Unspecified severe protein-calorie malnutrition: Secondary | ICD-10-CM

## 2017-10-13 DIAGNOSIS — C482 Malignant neoplasm of peritoneum, unspecified: Secondary | ICD-10-CM

## 2017-10-13 DIAGNOSIS — Z720 Tobacco use: Secondary | ICD-10-CM

## 2017-10-13 DIAGNOSIS — Z006 Encounter for examination for normal comparison and control in clinical research program: Secondary | ICD-10-CM

## 2017-10-13 DIAGNOSIS — D509 Iron deficiency anemia, unspecified: Secondary | ICD-10-CM | POA: Diagnosis not present

## 2017-10-13 DIAGNOSIS — R180 Malignant ascites: Secondary | ICD-10-CM

## 2017-10-13 DIAGNOSIS — F17210 Nicotine dependence, cigarettes, uncomplicated: Secondary | ICD-10-CM

## 2017-10-13 DIAGNOSIS — K5909 Other constipation: Secondary | ICD-10-CM

## 2017-10-13 DIAGNOSIS — Z7189 Other specified counseling: Secondary | ICD-10-CM

## 2017-10-13 DIAGNOSIS — Z5111 Encounter for antineoplastic chemotherapy: Secondary | ICD-10-CM | POA: Diagnosis not present

## 2017-10-13 MED ORDER — PROCHLORPERAZINE MALEATE 10 MG PO TABS: 10.0000 mg | ORAL_TABLET | Freq: Four times a day (QID) | ORAL | 1 refills | Status: DC | PRN

## 2017-10-13 MED ORDER — LIDOCAINE-PRILOCAINE 2.5-2.5 % EX CREA
TOPICAL_CREAM | CUTANEOUS | 3 refills | Status: DC
Start: 2017-10-13 — End: 2017-12-20

## 2017-10-13 MED ORDER — DEXAMETHASONE 4 MG PO TABS: ORAL_TABLET | ORAL | 0 refills | Status: DC

## 2017-10-13 MED FILL — LIDOCAINE-PRILOCAINE CREAM: 2.5-2.5 | 30 days supply | Qty: 30 | Fill #0

## 2017-10-13 MED FILL — PROCHLORPERAZINE 10 MG TAB: 10 | 7 days supply | Qty: 30 | Fill #0

## 2017-10-13 MED FILL — DEXAMETHASONE 4 MG TABLET: 4 | 84 days supply | Qty: 40 | Fill #0

## 2017-10-13 NOTE — Assessment & Plan Note (Signed)
She will undergo paracentesis tomorrow

## 2017-10-13 NOTE — Assessment & Plan Note (Signed)
She have severe protein calorie malnutrition due to untreated malignancy I will consult dietitian for visit We discussed frequent small meals

## 2017-10-13 NOTE — Telephone Encounter (Signed)
Gave avs and calendar ° °

## 2017-10-13 NOTE — Assessment & Plan Note (Signed)
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

## 2017-10-13 NOTE — Assessment & Plan Note (Signed)
The most likely cause of her anemia is due to chronic blood loss/malabsorption syndrome. We discussed some of the risks, benefits, and alternatives of intravenous iron infusions. The patient is symptomatic from anemia and the iron level is critically low. She tolerated oral iron supplement poorly and desires to achieved higher levels of iron faster for adequate hematopoesis. Some of the side-effects to be expected including risks of infusion reactions, phlebitis, headaches, nausea and fatigue.  The patient is willing to proceed. Patient education material was dispensed.  Goal is to keep ferritin level greater than 50  

## 2017-10-13 NOTE — Assessment & Plan Note (Signed)
We discussed goals of care Intent of treatment is curative

## 2017-10-13 NOTE — Progress Notes (Signed)
[place] OFFICE PROGRESS NOTE  Patient Care Team: Patient, [name] Per as PCP - General (General Practice)  ASSESSMENT & PLAN:  Primary peritoneal carcinomatosis ([place]) We reviewed the NCCN guidelines We discussed the role of chemotherapy. The intent is of curative intent.  We discussed some of the risks, benefits, side-effects of carboplatin & Taxol. Treatment is intravenous, every 3 weeks x 6 cycles  Some of the short term side-effects included, though not limited to, including weight loss, life threatening infections, risk of allergic reactions, need for transfusions of blood products, nausea, vomiting, change in bowel habits, loss of hair, admission to hospital for various reasons, and risks of death.   Long term side-effects are also discussed including risks of infertility, permanent damage to nerve function, hearing loss, chronic fatigue, kidney damage with possibility needing hemodialysis, and rare secondary malignancy including bone marrow disorders.  The patient is aware that the response rates discussed earlier is not guaranteed.  After a long discussion, patient made an informed decision to proceed with the prescribed plan of care.   Patient education material was dispensed. We discussed premedication with dexamethasone before chemotherapy. 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 We  discussed some of the risks, benefits, and alternatives of intravenous iron infusions. The patient is symptomatic from anemia and the iron level is critically low. She tolerated oral iron supplement poorly and desires to achieved higher levels of iron faster for adequate hematopoesis. Some of the side-effects to be expected including risks of infusion reactions, phlebitis, headaches, nausea and fatigue.  The patient is willing to proceed. Patient education material was dispensed.  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 CBC with Differential (Cancer Center Only)    Standing Status:   Standing    Number of Occurrences:   20    Standing Expiration Date:   10/14/2018  . CMP ([place] only)    Standing Status:   Standing    Number of Occurrences:   20    Standing Expiration Date:   10/14/2018    INTERVAL HISTORY: Please see below for problem oriented charting. She returns with her sons for further follow-up Her pain is stable with increased pain medicine She is taking laxatives regularly Nausea is stable She has quit smoking She continues to complain of abdominal discomfort from ascites The patient denies any recent signs or symptoms of bleeding such as spontaneous epistaxis, hematuria or hematochezia.   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      10/08/2017 Procedure    Extensive omental and peritoneal tumor is present anteriorly deep to the abdominal wall. From a midline approach, tumor was sampled just to the left of midline in the upper pelvis, below the level of the umbilicus. Solid tissue was obtained.  IMPRESSION: CT-guided core biopsy performed of omental/peritoneal tumor in the anterior peritoneal cavity.      10/08/2017 Pathology Results    Soft Tissue Needle Core Biopsy, Omentum/Peritoneal Cavity - POORLY DIFFERENTIATED CARCINOMA. Microscopic Comment Immunohistochemistry will be performed and reported as an addendum. Dr. [name] agrees. Called to [facility] on 10/11/17. (JDP:kh 10/11/17) ADDENDUM: Immunohistochemistry shows the tumor is positive with cytokeratin AE1/AE3, cytokeratin 7, estrogen receptor, WT-1, and p16. The tumor is negative with CD56, chromogranin, synaptophysin, CDX-2, cytokeratin 20, progesterone receptor, GATA-3, GCDFP, Napsin-A and thyroid transcription factor-1. 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      10/12/2017 Cancer Staging    Staging form: Stomach - Neuroendocrine Tumors, AJCC 8th Edition - Clinical: Stage IV (cTX, cN0, cM1b) - Signed by [name], MD on 10/12/2017       REVIEW OF SYSTEMS:   Constitutional: Denies fevers, chills or abnormal weight loss Eyes: Denies blurriness of vision Ears, nose,  mouth, throat, and face: Denies mucositis or sore throat Respiratory: Denies cough, dyspnea or wheezes Cardiovascular: Denies palpitation, chest discomfort or lower extremity swelling Skin: Denies abnormal skin rashes Lymphatics: Denies new lymphadenopathy or easy bruising Neurological:Denies numbness, tingling or new weaknesses Behavioral/Psych: Mood is stable, no new changes  All other systems were reviewed with the patient and are negative.  I have reviewed the past medical history, past surgical history, social history and family history with the patient and they are unchanged from previous note.  ALLERGIES:  has No Known Allergies.  MEDICATIONS:  Current Outpatient Medications  Medication Sig Dispense Refill  . aspirin EC 81 MG tablet Take 81 mg by mouth daily.    Marland Kitchen atorvastatin (LIPITOR) 80 MG tablet Take 80 mg by mouth daily.    . budesonide-formoterol (SYMBICORT) 160-4.5 MCG/ACT inhaler Inhale 2 puffs into the lungs 2 (two) times daily. Use two puffs by mouth twice daily(rinse mouth after use)    . carvedilol (COREG) 12.5 MG tablet Take 12.5 mg by mouth 2 (two) times daily with a meal.    . citalopram (CELEXA) 40 MG tablet Take 40 mg by mouth daily.    Marland Kitchen dexamethasone (DECADRON) 4 MG tablet Take 5 tabs the night before and 5 tabs the morning of chemotherapy, every 3 weeks with food 60 tablet 0  . furosemide (LASIX) 20 MG tablet Take 20 mg by mouth daily.    Marland Kitchen HYDROcodone-acetaminophen (NORCO) 10-325 MG tablet Take 1 tablet by mouth every 3 (three) hours as needed. 60 tablet 0  . lidocaine-prilocaine (EMLA) cream Apply to affected area once 30 g 3  .  lubiprostone (AMITIZA) 8 MCG capsule Take 8 mcg by mouth 2 (two) times daily as needed for constipation.    . meclizine (ANTIVERT) 25 MG tablet Take 25 mg by mouth 3 (three) times daily as needed for dizziness.    . Melatonin 5 MG CAPS Take 2 capsules by mouth at bedtime as needed (sleep). Take 2 capsules at bedtime as needed    .  ondansetron (ZOFRAN) 4 MG tablet Take 4 mg by mouth every 6 (six) hours as needed for nausea or vomiting.    . polyethylene glycol (MIRALAX / GLYCOLAX) packet Take 17 g by mouth daily as needed.    . prochlorperazine (COMPAZINE) 10 MG tablet Take 1 tablet (10 mg total) by mouth every 6 (six) hours as needed (Nausea or vomiting). 30 tablet 1   No current facility-administered medications for this visit.     PHYSICAL EXAMINATION: ECOG PERFORMANCE STATUS: 2 - Symptomatic, <50% confined to bed  Vitals:   10/13/17 1003  BP: 133/73  Pulse: 90  Resp: 18  Temp: 97.6 F (36.4 C)  SpO2: 97%   Filed Weights   10/13/17 1003  Weight: 115 lb 12.8 oz (52.5 kg)    GENERAL:alert, no distress and comfortable ABDOMEN:is distended with ascites Musculoskeletal:no cyanosis of digits and no clubbing  NEURO: alert & oriented x 3 with fluent speech, no focal motor/sensory deficits  LABORATORY DATA:  I have reviewed the data as listed    Component Value Date/Time   NA 137 09/29/2017 1604   K 4.0 09/29/2017 1604   CL 99 09/29/2017 1604   CO2 26 09/29/2017 1604   GLUCOSE 103 09/29/2017 1604   BUN 10 09/29/2017 1604   CREATININE 0.74 09/29/2017 1604   CALCIUM 9.2 09/29/2017 1604   GFRNONAA >60 09/29/2017 1604   GFRAA >60 09/29/2017 1604    No results found for: SPEP, UPEP  Lab Results  Component Value Date   WBC 5.6 10/08/2017   HGB 9.5 (L) 10/08/2017   HCT 30.2 (L) 10/08/2017   MCV 60.8 (L) 10/08/2017   PLT 503 (H) 10/08/2017      Chemistry      Component Value Date/Time   NA 137 09/29/2017 1604   K 4.0 09/29/2017 1604   CL 99 09/29/2017 1604   CO2 26 09/29/2017 1604   BUN 10 09/29/2017 1604   CREATININE 0.74 09/29/2017 1604      Component Value Date/Time   CALCIUM 9.2 09/29/2017 1604       RADIOGRAPHIC STUDIES: I have personally reviewed the radiological images as listed and agreed with the findings in the report. Ct Biopsy  Result Date: 10/08/2017 CLINICAL DATA:   Ascites, omental and peritoneal carcinomatosis and suspicion metastatic ovarian carcinoma. Core biopsy of omental tumor has been requested for tissue diagnosis. EXAM: CT GUIDED CORE BIOPSY OF OMENTAL/PERITONEAL TUMOR ANESTHESIA/SEDATION: 1.0 mg IV Versed; 50 mcg IV Fentanyl Total Moderate Sedation Time:  18 minutes. The patient's level of consciousness and physiologic status were continuously monitored during the procedure by Radiology nursing. PROCEDURE: The procedure risks, benefits, and alternatives were explained to the patient. Questions regarding the procedure were encouraged and answered. The patient understands and consents to the procedure. A time-out was performed prior to initiating the procedure. CT was performed in a supine position through the abdomen and pelvis. The lower abdominal wall was prepped with chlorhexidine in a sterile fashion, and a sterile drape was applied covering the operative field. A sterile gown and sterile gloves were used for the procedure.  Local anesthesia was provided with 1% Lidocaine. A 17 gauge needle was advanced in the midline lower abdominal wall at the level of the pelvis to the level omental tumor. Coaxial 18 gauge core biopsy samples were obtained. Three separate core biopsy samples were obtained and submitted in formalin. Additional CT images were performed after outer needle removal. COMPLICATIONS: None FINDINGS: Extensive omental and peritoneal tumor is present anteriorly deep to the abdominal wall. From a midline approach, tumor was sampled just to the left of midline in the upper pelvis, below the level of the umbilicus. Solid tissue was obtained. IMPRESSION: CT-guided core biopsy performed of omental/peritoneal tumor in the anterior peritoneal cavity. Electronically Signed   By: Aletta Edouard M.D.   On: 10/08/2017 11:25   Ct Outside Films Body  Result Date: 10/01/2017 This examination belongs to an outside facility and is stored here for comparison purposes  only.  Contact the originating outside institution for any associated report or interpretation.   All questions were answered. The patient knows to call the clinic with any problems, questions or concerns. No barriers to learning was detected.  I spent 40 minutes counseling the patient face to face. The total time spent in the appointment was 55 minutes and more than 50% was on counseling and review of test results  [name], MD 10/13/2017 10:30 AM

## 2017-10-13 NOTE — Assessment & Plan Note (Signed)
She has poorly controlled cancer pain Pain control is better with increased pain medicine We will continue to same

## 2017-10-13 NOTE — Assessment & Plan Note (Signed)
We reviewed the NCCN guidelines We discussed the role of chemotherapy. The intent is of curative intent.  We discussed some of the risks, benefits, side-effects of carboplatin & Taxol. Treatment is intravenous, every 3 weeks x 6 cycles  Some of the short term side-effects included, though not limited to, including weight loss, life threatening infections, risk of allergic reactions, need for transfusions of blood products, nausea, vomiting, change in bowel habits, loss of hair, admission to hospital for various reasons, and risks of death.   Long term side-effects are also discussed including risks of infertility, permanent damage to nerve function, hearing loss, chronic fatigue, kidney damage with possibility needing hemodialysis, and rare secondary malignancy including bone marrow disorders.  The patient is aware that the response rates discussed earlier is not guaranteed.  After a long discussion, patient made an informed decision to proceed with the prescribed plan of care.   Patient education material was dispensed. We discussed premedication with dexamethasone before chemotherapy. With her age, I will cover her with G-CSF support She will have port placement tomorrow We discussed clinical trial enrollment for specimen only study and she agreed to proceed

## 2017-10-13 NOTE — Assessment & Plan Note (Signed)
I congratulated her efforts with nicotine cessation

## 2017-10-14 ENCOUNTER — Other Ambulatory Visit: Payer: Self-pay | Admitting: Hematology and Oncology

## 2017-10-14 ENCOUNTER — Encounter (HOSPITAL_COMMUNITY): Payer: Self-pay

## 2017-10-14 ENCOUNTER — Ambulatory Visit (HOSPITAL_COMMUNITY)
Admission: RE | Admit: 2017-10-14 | Discharge: 2017-10-14 | Disposition: A | Discharge status: Home / Self Care | Payer: Medicare PPO | Source: Ambulatory Visit | Attending: Hematology and Oncology | Admitting: Hematology and Oncology

## 2017-10-14 DIAGNOSIS — R188 Other ascites: Secondary | ICD-10-CM | POA: Insufficient documentation

## 2017-10-14 DIAGNOSIS — F17210 Nicotine dependence, cigarettes, uncomplicated: Secondary | ICD-10-CM | POA: Diagnosis not present

## 2017-10-14 DIAGNOSIS — C482 Malignant neoplasm of peritoneum, unspecified: Secondary | ICD-10-CM | POA: Insufficient documentation

## 2017-10-14 DIAGNOSIS — F419 Anxiety disorder, unspecified: Secondary | ICD-10-CM | POA: Diagnosis not present

## 2017-10-14 DIAGNOSIS — I10 Essential (primary) hypertension: Secondary | ICD-10-CM | POA: Insufficient documentation

## 2017-10-14 DIAGNOSIS — E7800 Pure hypercholesterolemia, unspecified: Secondary | ICD-10-CM | POA: Insufficient documentation

## 2017-10-14 DIAGNOSIS — R180 Malignant ascites: Secondary | ICD-10-CM

## 2017-10-14 DIAGNOSIS — Z7982 Long term (current) use of aspirin: Secondary | ICD-10-CM | POA: Insufficient documentation

## 2017-10-14 DIAGNOSIS — F329 Major depressive disorder, single episode, unspecified: Secondary | ICD-10-CM | POA: Insufficient documentation

## 2017-10-14 DIAGNOSIS — I341 Nonrheumatic mitral (valve) prolapse: Secondary | ICD-10-CM | POA: Insufficient documentation

## 2017-10-14 DIAGNOSIS — Z7951 Long term (current) use of inhaled steroids: Secondary | ICD-10-CM | POA: Insufficient documentation

## 2017-10-14 DIAGNOSIS — C800 Disseminated malignant neoplasm, unspecified: Secondary | ICD-10-CM

## 2017-10-14 HISTORY — PX: IR US GUIDE VASC ACCESS RIGHT: IMG2390

## 2017-10-14 HISTORY — PX: IR FLUORO GUIDE PORT INSERTION RIGHT: IMG5741

## 2017-10-14 HISTORY — DX: Disseminated malignant neoplasm, unspecified: C80.0

## 2017-10-14 HISTORY — PX: IR PARACENTESIS: IMG2679

## 2017-10-14 LAB — BASIC METABOLIC PANEL
ANION GAP: 10 (ref 5–15)
BUN: 10 mg/dL (ref 6–20)
CALCIUM: 8.9 mg/dL (ref 8.9–10.3)
CHLORIDE: 100 mmol/L — AB (ref 101–111)
CO2: 25 mmol/L (ref 22–32)
Creatinine, Ser: 0.7 mg/dL (ref 0.44–1.00)
GFR calc non Af Amer: >60 mL/min (ref >60)
GLUCOSE: 100 mg/dL — AB (ref 65–99)
Potassium: 4.2 mmol/L (ref 3.5–5.1)
Sodium: 135 mmol/L (ref 135–145)

## 2017-10-14 LAB — CBC
HCT: 32.4 % — ABNORMAL LOW (ref 36.0–46.0)
HEMOGLOBIN: 10.3 g/dL — AB (ref 12.0–15.0)
MCH: 19 pg — AB (ref 26.0–34.0)
MCHC: 31.8 g/dL (ref 30.0–36.0)
MCV: 59.7 fL — AB (ref 78.0–100.0)
Platelets: 648 10*3/uL — ABNORMAL HIGH (ref 150–400)
RBC: 5.43 MIL/uL — AB (ref 3.87–5.11)
RDW: 16.1 % — ABNORMAL HIGH (ref 11.5–15.5)
WBC: 6.7 10*3/uL (ref 4.0–10.5)

## 2017-10-14 LAB — PROTIME-INR
INR: 1.22
PROTHROMBIN TIME: 15.3 s — AB (ref 11.4–15.2)

## 2017-10-14 MED ORDER — MIDAZOLAM HCL 2 MG/2ML IJ SOLN
INTRAMUSCULAR | Status: AC | PRN
  Administered 2017-10-14 (×2): 1 mg via INTRAVENOUS

## 2017-10-14 MED ORDER — MIDAZOLAM HCL 2 MG/2ML IJ SOLN
INTRAMUSCULAR | Status: AC
  Filled 2017-10-14: qty 2

## 2017-10-14 MED ORDER — CEFAZOLIN SODIUM-DEXTROSE 2-4 GM/100ML-% IV SOLN
2.0000 g | INTRAVENOUS | Status: AC
  Administered 2017-10-14: 2 g via INTRAVENOUS

## 2017-10-14 MED ORDER — LIDOCAINE HCL 1 % IJ SOLN
INTRAMUSCULAR | Status: AC | PRN
  Administered 2017-10-14: 10 mL

## 2017-10-14 MED ORDER — LIDOCAINE-EPINEPHRINE (PF) 1 %-1:200000 IJ SOLN
INTRAMUSCULAR | Status: AC
  Filled 2017-10-14: qty 30

## 2017-10-14 MED ORDER — HEPARIN SOD (PORK) LOCK FLUSH 100 UNIT/ML IV SOLN
INTRAVENOUS | Status: AC
  Administered 2017-10-14: 500 [IU]
  Filled 2017-10-14: qty 5

## 2017-10-14 MED ORDER — FENTANYL CITRATE (PF) 100 MCG/2ML IJ SOLN
INTRAMUSCULAR | Status: AC
  Filled 2017-10-14: qty 2

## 2017-10-14 MED ORDER — SODIUM CHLORIDE 0.9 % IV SOLN: INTRAVENOUS | Status: DC

## 2017-10-14 MED ORDER — LIDOCAINE-EPINEPHRINE (PF) 2 %-1:200000 IJ SOLN
INTRAMUSCULAR | Status: DC | PRN
  Administered 2017-10-14: 20 mL

## 2017-10-14 MED ORDER — LIDOCAINE HCL 1 % IJ SOLN
INTRAMUSCULAR | Status: AC
  Filled 2017-10-14: qty 20

## 2017-10-14 MED ORDER — CEFAZOLIN SODIUM-DEXTROSE 2-4 GM/100ML-% IV SOLN
INTRAVENOUS | Status: AC
  Administered 2017-10-14: 2 g via INTRAVENOUS
  Filled 2017-10-14: qty 100

## 2017-10-14 MED ORDER — FENTANYL CITRATE (PF) 100 MCG/2ML IJ SOLN
INTRAMUSCULAR | Status: AC | PRN
  Administered 2017-10-14 (×2): 50 ug via INTRAVENOUS

## 2017-10-14 MED ORDER — LIDOCAINE HCL (PF) 2 % IJ SOLN
INTRAMUSCULAR | Status: AC
  Filled 2017-10-14: qty 10

## 2017-10-14 MED ORDER — HEPARIN SODIUM (PORCINE) 1000 UNIT/ML IJ SOLN
INTRAMUSCULAR | Status: AC
  Filled 2017-10-14: qty 1

## 2017-10-14 MED ORDER — LIDOCAINE HCL (PF) 1 % IJ SOLN
INTRAMUSCULAR | Status: AC
  Filled 2017-10-14: qty 30

## 2017-10-14 NOTE — Discharge Instructions (Addendum)
Implanted Port Insertion, Care After °This sheet gives you information about how to care for yourself after your procedure. Your health care provider may also give you more specific instructions. If you have problems or questions, contact your health care provider. °What can I expect after the procedure? °After your procedure, it is common to have: °· Discomfort at the port insertion site. °· Bruising on the skin over the port. This should improve over 3-4 days. ° °Follow these instructions at home: °Port care °· After your port is placed, you will get a manufacturer's information card. The card has information about your port. Keep this card with you at all times. °· Take care of the port as told by your health care provider. Ask your health care provider if you or a family member can get training for taking care of the port at home. A home health care nurse may also take care of the port. °· Make sure to remember what type of port you have. °Incision care °· Follow instructions from your health care provider about how to take care of your port insertion site. Make sure you: °? Wash your hands with soap and water before you change your bandage (dressing). If soap and water are not available, use hand sanitizer. °? Change your dressing as told by your health care provider. °? Leave stitches (sutures), skin glue, or adhesive strips in place. These skin closures may need to stay in place for 2 weeks or longer. If adhesive strip edges start to loosen and curl up, you may trim the loose edges. Do not remove adhesive strips completely unless your health care provider tells you to do that. °· Check your port insertion site every day for signs of infection. Check for: °? More redness, swelling, or pain. °? More fluid or blood. °? Warmth. °? Pus or a bad smell. °General instructions °· Do not take baths, swim, or use a hot tub until your health care provider approves. °· Do not lift anything that is heavier than 10 lb (4.5  kg) for a week, or as told by your health care provider. °· Ask your health care provider when it is okay to: °? Return to work or school. °? Resume usual physical activities or sports. °· Do not drive for 24 hours if you were given a medicine to help you relax (sedative). °· Take over-the-counter and prescription medicines only as told by your health care provider. °· Wear a medical alert bracelet in case of an emergency. This will tell any health care providers that you have a port. °· Keep all follow-up visits as told by your health care provider. This is important. °Contact a health care provider if: °· You cannot flush your port with saline as directed, or you cannot draw blood from the port. °· You have a fever or chills. °· You have more redness, swelling, or pain around your port insertion site. °· You have more fluid or blood coming from your port insertion site. °· Your port insertion site feels warm to the touch. °· You have pus or a bad smell coming from the port insertion site. °Get help right away if: °· You have chest pain or shortness of breath. °· You have bleeding from your port that you cannot control. °Summary °· Take care of the port as told by your health care provider. °· Change your dressing as told by your health care provider. °· Keep all follow-up visits as told by your health care provider. °  This information is not intended to replace advice given to you by your health care provider. Make sure you discuss any questions you have with your health care provider. °Document Released: 03/15/2013 Document Revised: 04/15/2016 Document Reviewed: 04/15/2016 °Elsevier Interactive Patient Education © 2017 [employer] °Moderate Conscious Sedation, Adult, Care After °These instructions provide you with information about caring for yourself after your procedure. Your health care provider may also give you more specific instructions. Your treatment has been planned according to current medical  practices, but problems sometimes occur. Call your health care provider if you have any problems or questions after your procedure. °What can I expect after the procedure? °After your procedure, it is common: °· To feel sleepy for several hours. °· To feel clumsy and have poor balance for several hours. °· To have poor judgment for several hours. °· To vomit if you eat too soon. ° °Follow these instructions at home: °For at least 24 hours after the procedure: ° °· Do not: °? Participate in activities where you could fall or become injured. °? Drive. °? Use heavy machinery. °? Drink alcohol. °? Take sleeping pills or medicines that cause drowsiness. °? Make important decisions or sign legal documents. °? Take care of children on your own. °· Rest. °Eating and drinking °· Follow the diet recommended by your health care provider. °· If you vomit: °? Drink water, juice, or soup when you can drink without vomiting. °? Make sure you have little or no nausea before eating solid foods. °General instructions °· Have a responsible adult stay with you until you are awake and alert. °· Take over-the-counter and prescription medicines only as told by your health care provider. °· If you smoke, do not smoke without supervision. °· Keep all follow-up visits as told by your health care provider. This is important. °Contact a health care provider if: °· You keep feeling nauseous or you keep vomiting. °· You feel light-headed. °· You develop a rash. °· You have a fever. °Get help right away if: °· You have trouble breathing. °This information is not intended to replace advice given to you by your health care provider. Make sure you discuss any questions you have with your health care provider. °Document Released: 03/15/2013 Document Revised: 10/28/2015 Document Reviewed: 09/14/2015 °Elsevier Interactive Patient Education © 2018 [employer]vier Inc. ° °

## 2017-10-14 NOTE — Progress Notes (Signed)
Patient discharge instructions and teaching completed. Patient and son [name]) state understanding of discharge AVS without any questions and concerns. Copy of AVS given to patient. Patient discharge via wheelchair and accompanied by son [name]. Port-a-Cath dressing clean, dry, intact.

## 2017-10-14 NOTE — H&P (Addendum)
Chief Complaint: Patient was seen in consultation today for [facility] a Cath placement and paracentesis at the request of [place]  Referring Physician(s): [name]  Supervising Physician: Jacqulynn Cadet  Patient Status: [facility] - Out-pt  History of Present Illness: Jo Parker is a 72 y.o. female   Peritoneal Carcinomatosis Ascites Scheduled to begin chemotherapy Monday  Scheduled for [facility] a cath placement and paracentesis today  Past Medical History:  Diagnosis Date  . Anxiety and depression   . Aortic aneurysm ([place])   . Arthritis   . Carcinomatosis ([place]) 10/2017  . History of diverticulitis   . Hypercholesterolemia   . Hypertension   . Mitral valve prolapse   . Uterine cancer ([place])    had partial hysterectomy at 27    Past Surgical History:  Procedure Laterality Date  . ABDOMINAL HYSTERECTOMY    . cardiac monitor implant    . CEREBRAL ANEURYSM REPAIR    . CERVICAL SPINE SURGERY     bone spurs  . PARTIAL HYSTERECTOMY  1971   "precancer"    Allergies: Patient has no known allergies.  Medications: Prior to Admission medications   Medication Sig Start Date End Date Taking? Authorizing Provider  aspirin EC 81 MG tablet Take 81 mg by mouth daily.    [provider]  atorvastatin (LIPITOR) 80 MG tablet Take 80 mg by mouth daily.    [provider]  budesonide-formoterol (SYMBICORT) 160-4.5 MCG/ACT inhaler Inhale 2 puffs into the lungs 2 (two) times daily. Use two puffs by mouth twice daily(rinse mouth after use)    [provider]  carvedilol (COREG) 12.5 MG tablet Take 12.5 mg by mouth 2 (two) times daily with a meal.    [provider]  citalopram (CELEXA) 40 MG tablet Take 40 mg by mouth daily.    [provider]  dexamethasone (DECADRON) 4 MG tablet Take 5 tabs the night before and 5 tabs the morning of chemotherapy, every 3 weeks with food 10/13/17   [name], MD  furosemide (LASIX) 20 MG tablet Take 20 mg  by mouth daily.    [provider]  HYDROcodone-acetaminophen (NORCO) 10-325 MG tablet Take 1 tablet by mouth every 3 (three) hours as needed. 10/08/17   [name], MD  lidocaine-prilocaine (EMLA) cream Apply to affected area once 10/13/17   [name], MD  lubiprostone (AMITIZA) 8 MCG capsule Take 8 mcg by mouth 2 (two) times daily as needed for constipation.    [provider]  meclizine (ANTIVERT) 25 MG tablet Take 25 mg by mouth 3 (three) times daily as needed for dizziness.    [provider]  Melatonin 5 MG CAPS Take 2 capsules by mouth at bedtime as needed (sleep). Take 2 capsules at bedtime as needed    [provider]  ondansetron (ZOFRAN) 4 MG tablet Take 4 mg by mouth every 6 (six) hours as needed for nausea or vomiting.    [provider]  polyethylene glycol (MIRALAX / GLYCOLAX) packet Take 17 g by mouth daily as needed.    [provider]  prochlorperazine (COMPAZINE) 10 MG tablet Take 1 tablet (10 mg total) by mouth every 6 (six) hours as needed (Nausea or vomiting). 10/13/17   [name], MD     Family History  Problem Relation Age of Onset  . Endometrial cancer Sister 32       endometrial ca  . Lung cancer Paternal Aunt   . Breast cancer Paternal Aunt   . Breast  cancer Paternal Aunt   . Stomach cancer Maternal Grandfather   . Prostate cancer Paternal Uncle   . Breast cancer Cousin   . Breast cancer Cousin   . Lung cancer Father        lung ca    Social History   Socioeconomic History  . Marital status: Married    Spouse name: Not on file  . Number of children: 3  . Years of education: Not on file  . Highest education level: Not on file  Occupational History  . Occupation: retired  Scientific laboratory technician  . Financial resource strain: Not on file  . Food insecurity:    Worry: Not on file    Inability: Not on file  . Transportation needs:    Medical: Not on file    Non-medical: Not on file  Tobacco Use  . Smoking  status: Current Some Day Smoker    Packs/day: 1.00    Years: 55.00    Pack years: 55.00  . Smokeless tobacco: Never Used  Substance and Sexual Activity  . Alcohol use: Yes    Comment: 1 / week  . Drug use: Never  . Sexual activity: Not Currently  Lifestyle  . Physical activity:    Days per week: Not on file    Minutes per session: Not on file  . Stress: Not on file  Relationships  . Social connections:    Talks on phone: Not on file    Gets together: Not on file    Attends religious service: Not on file    Active member of club or organization: Not on file    Attends meetings of clubs or organizations: Not on file    Relationship status: Not on file  Other Topics Concern  . Not on file  Social History Narrative  . Not on file    Review of Systems: A 12 point ROS discussed and pertinent positives are indicated in the HPI above.  All other systems are negative.  Review of Systems  Constitutional: Positive for activity change and unexpected weight change.  Respiratory: Negative for cough and shortness of breath.   Cardiovascular: Negative for chest pain.  Gastrointestinal: Positive for abdominal distention and abdominal pain.  Neurological: Positive for weakness.  Psychiatric/Behavioral: Negative for behavioral problems and confusion.    Vital Signs: Temp: 97.7; HR 83; R: 23; BP 125/75; 95% RA  Physical Exam  Constitutional: She is oriented to person, place, and time.  Cardiovascular: Normal rate and regular rhythm.  Pulmonary/Chest: Effort normal and breath sounds normal.  Abdominal: Soft. She exhibits distension. There is tenderness.  Musculoskeletal: Normal range of motion.  Neurological: She is alert and oriented to person, place, and time.  Skin: Skin is warm and dry.  Psychiatric: She has a normal mood and affect. Her behavior is normal. Judgment and thought content normal.  Nursing note and vitals reviewed.   Imaging: Ct Biopsy  Result Date:  10/08/2017 CLINICAL DATA:  Ascites, omental and peritoneal carcinomatosis and suspicion metastatic ovarian carcinoma. Core biopsy of omental tumor has been requested for tissue diagnosis. EXAM: CT GUIDED CORE BIOPSY OF OMENTAL/PERITONEAL TUMOR ANESTHESIA/SEDATION: 1.0 mg IV Versed; 50 mcg IV Fentanyl Total Moderate Sedation Time:  18 minutes. The patient's level of consciousness and physiologic status were continuously monitored during the procedure by Radiology nursing. PROCEDURE: The procedure risks, benefits, and alternatives were explained to the patient. Questions regarding the procedure were encouraged and answered. The patient understands and consents to the procedure. A time-out  was performed prior to initiating the procedure. CT was performed in a supine position through the abdomen and pelvis. The lower abdominal wall was prepped with chlorhexidine in a sterile fashion, and a sterile drape was applied covering the operative field. A sterile gown and sterile gloves were used for the procedure. Local anesthesia was provided with 1% Lidocaine. A 17 gauge needle was advanced in the midline lower abdominal wall at the level of the pelvis to the level omental tumor. Coaxial 18 gauge core biopsy samples were obtained. Three separate core biopsy samples were obtained and submitted in formalin. Additional CT images were performed after outer needle removal. COMPLICATIONS: None FINDINGS: Extensive omental and peritoneal tumor is present anteriorly deep to the abdominal wall. From a midline approach, tumor was sampled just to the left of midline in the upper pelvis, below the level of the umbilicus. Solid tissue was obtained. IMPRESSION: CT-guided core biopsy performed of omental/peritoneal tumor in the anterior peritoneal cavity. Electronically Signed   By: Aletta Edouard M.D.   On: 10/08/2017 11:25   Ct Outside Films Body  Result Date: 10/01/2017 This examination belongs to an outside facility and is stored  here for comparison purposes only.  Contact the originating outside institution for any associated report or interpretation.   Labs:  CBC: Recent Labs    10/08/17 0710 10/14/17 0706  WBC 5.6 6.7  HGB 9.5* 10.3*  HCT 30.2* 32.4*  PLT 503* 648*    COAGS: Recent Labs    10/08/17 0710 10/14/17 0706  INR 1.22 1.22  APTT 32  --     BMP: Recent Labs    09/29/17 1604  NA 137  K 4.0  CL 99  CO2 26  GLUCOSE 103  BUN 10  CALCIUM 9.2  CREATININE 0.74  GFRNONAA >60  GFRAA >60    LIVER FUNCTION TESTS: No results for input(s): BILITOT, AST, ALT, ALKPHOS, PROT, ALBUMIN in the last 8760 hours.  TUMOR MARKERS: No results for input(s): AFPTM, CEA, CA199, CHROMGRNA in the last 8760 hours.  Assessment and Plan:  Recent dx peritoneal carcinomatosis Scheduled for Port a cath placement +ascites-- scheduled also for paracentesis Risks and benefits of image guided port-a-catheter placement was discussed with the patient including, but not limited to bleeding, infection, pneumothorax, or fibrin sheath development and need for additional procedures.  All of the patient's questions were answered, patient is agreeable to proceed. Consent signed and in chart.   Thank you for this interesting consult.  I greatly enjoyed meeting Saralyn Willison and look forward to participating in their care.  A copy of this report was sent to the requesting provider on this date.  Electronically Signed: Lavonia Drafts, PA-C 10/14/2017, 7:41 AM   I spent a total of    25 Minutes in face to face in clinical consultation, greater than 50% of which was counseling/coordinating care for [facility] and paracentesis

## 2017-10-14 NOTE — Procedures (Signed)
Interventional Radiology Procedure Note  Procedure: Placement of a right IJ approach single lumen PowerPort.  Tip is positioned at the superior cavoatrial junction and catheter is ready for immediate use.  Complications: No immediate Recommendations:  - Ok to shower tomorrow - Do not submerge for 7 days - Routine line care   Signed,  Heath K. McCullough, MD   

## 2017-10-15 ENCOUNTER — Telehealth: Payer: Self-pay | Admitting: *Deleted

## 2017-10-15 ENCOUNTER — Other Ambulatory Visit: Payer: Self-pay | Admitting: Hematology and Oncology

## 2017-10-15 ENCOUNTER — Inpatient Hospital Stay: Payer: Medicare PPO

## 2017-10-15 ENCOUNTER — Other Ambulatory Visit: Payer: PRIVATE HEALTH INSURANCE

## 2017-10-15 ENCOUNTER — Inpatient Hospital Stay: Payer: Medicare PPO | Admitting: Nutrition

## 2017-10-15 VITALS — BP 109/47 | HR 79 | Temp 97.9°F | Resp 18

## 2017-10-15 DIAGNOSIS — Z006 Encounter for examination for normal comparison and control in clinical research program: Secondary | ICD-10-CM

## 2017-10-15 DIAGNOSIS — Z5111 Encounter for antineoplastic chemotherapy: Secondary | ICD-10-CM | POA: Diagnosis not present

## 2017-10-15 DIAGNOSIS — D509 Iron deficiency anemia, unspecified: Secondary | ICD-10-CM

## 2017-10-15 DIAGNOSIS — C482 Malignant neoplasm of peritoneum, unspecified: Secondary | ICD-10-CM

## 2017-10-15 DIAGNOSIS — D539 Nutritional anemia, unspecified: Secondary | ICD-10-CM

## 2017-10-15 LAB — CBC WITH DIFFERENTIAL/PLATELET
BASOS PCT: 1 %
Basophils Absolute: 0 10*3/uL (ref 0.0–0.1)
EOS ABS: 0.2 10*3/uL (ref 0.0–0.5)
Eosinophils Relative: 3 %
HCT: 29.6 % — ABNORMAL LOW (ref 34.8–46.6)
HEMOGLOBIN: 9.4 g/dL — AB (ref 11.6–15.9)
Lymphocytes Relative: 21 %
Lymphs Abs: 1.1 10*3/uL (ref 0.9–3.3)
MCH: 19 pg — ABNORMAL LOW (ref 25.1–34.0)
MCHC: 31.8 g/dL (ref 31.5–36.0)
MCV: 59.8 fL — ABNORMAL LOW (ref 79.5–101.0)
MONOS PCT: 10 %
Monocytes Absolute: 0.5 10*3/uL (ref 0.1–0.9)
Neutro Abs: 3.5 10*3/uL (ref 1.5–6.5)
Neutrophils Relative %: 65 %
Platelets: 487 10*3/uL — ABNORMAL HIGH (ref 145–400)
RBC: 4.94 MIL/uL (ref 3.70–5.45)
RDW: 16.2 % — AB (ref 11.2–14.5)
WBC: 5.3 10*3/uL (ref 3.9–10.3)

## 2017-10-15 LAB — COMPREHENSIVE METABOLIC PANEL
ALBUMIN: 2.3 g/dL — AB (ref 3.5–5.0)
ALK PHOS: 64 U/L (ref 40–150)
ALT: 14 U/L (ref 0–55)
AST: 25 U/L (ref 5–34)
Anion gap: 8 (ref 3–11)
BUN: 12 mg/dL (ref 7–26)
CO2: 24 mmol/L (ref 22–29)
Calcium: 8.9 mg/dL (ref 8.4–10.4)
Chloride: 102 mmol/L (ref 98–109)
Creatinine, Ser: 0.71 mg/dL (ref 0.60–1.10)
GFR calc Af Amer: >60 mL/min (ref >60)
GFR calc non Af Amer: >60 mL/min (ref >60)
GLUCOSE: 107 mg/dL (ref 70–140)
POTASSIUM: 4.2 mmol/L (ref 3.5–5.1)
SODIUM: 134 mmol/L — AB (ref 136–145)
Total Bilirubin: 0.3 mg/dL (ref 0.2–1.2)
Total Protein: 6.9 g/dL (ref 6.4–8.3)

## 2017-10-15 LAB — IRON AND TIBC
Iron: 25 ug/dL — ABNORMAL LOW (ref 41–142)
SATURATION RATIOS: 14 % — AB (ref 21–57)
TIBC: 178 ug/dL — ABNORMAL LOW (ref 236–444)
UIBC: 152 ug/dL

## 2017-10-15 LAB — FERRITIN: FERRITIN: 400 ng/mL — AB (ref 9–269)

## 2017-10-15 LAB — RESEARCH LABS

## 2017-10-15 LAB — VITAMIN B12: Vitamin B-12: 129 pg/mL — ABNORMAL LOW (ref 180–914)

## 2017-10-15 MED ORDER — SODIUM CHLORIDE 0.9 % IV SOLN
510.0000 mg | Freq: Once | INTRAVENOUS | Status: AC
  Administered 2017-10-15: 510 mg via INTRAVENOUS
  Filled 2017-10-15: qty 17

## 2017-10-15 NOTE — Patient Instructions (Signed)

## 2017-10-15 NOTE — Telephone Encounter (Signed)
Left message with note below. To call if has any questions 

## 2017-10-15 NOTE — Progress Notes (Signed)
72 year old female diagnosed with peritoneal cancer.  She is a patient of Dr. Alvy Bimler.  Past medical history includes uterine cancer, hypertension, increased cholesterol, arthritis, anxiety, depression.  Medications include Lipitor, Celexa, Decadron, Lasix, Zofran, MiraLAX, and Compazine.  Labs include glucose 100.  Height: 63 inches. Weight: 118 pounds. Usual body weight: 135 pounds. BMI: 20.9.  Patient reports she has a poor appetite but forces herself to eat a little. She reports occasional nausea and vomiting. She leans towards constipation but is on MiraLAX and stool softeners.  Reports last bowel movement was yesterday. She enjoys boost plus. She has just started drinking it. She has good family support. Dietary recall reveals patient consumes less than 1000 cal daily.  Estimated nutrition needs: 1500-1800 cal, 68 to 80 g protein 1.8 L fluid.  Nutrition diagnosis:  Unintended weight loss related to peritoneal cancer as evidenced by 13% weight loss from usual body weight.  Intervention: I educated patient to increase oral nutrition supplements 3 times daily between meals. I provided samples and coupons. Reviewed strategies for increasing overall calories and protein at mealtimes.   Reviewed strategies for improving nausea and vomiting as well as constipation. Fact sheets were given.  Monitoring, evaluation, goals: Patient will tolerate increased calories and protein to maintain weight.  Next visit: Monday, June 3 during infusion.  **Disclaimer: This note was dictated with voice recognition software. Similar sounding words can inadvertently be transcribed and this note may contain transcription errors which may not have been corrected upon publication of note.**

## 2017-10-15 NOTE — Telephone Encounter (Signed)
-----   Message from [name], MD sent at 10/15/2017 12:02 PM EDT ----- Regarding: RE: [name] not approved Insurance denied it Sabrena Gavitt, please call the patient and let her know and remove the injection appt. I will cancel the order from the treatment plan ----- Message ----- From: Altamese Dilling D Sent: 10/14/2017   5:24 PM To: Tora Kindred, RPH, [name], MD, # Subject: [name] not approved                           Good evening doctor Alvy Bimler,  For this patient her chemo has been authorized, however [name] was not approved by her insurance company. They stated due to low risk febrile neutropenia.  If you like to call to do a peer to peer the phone number is listed below. Bailey DOB 1946/05/13 Member ID #   [id number]  If you choose to do the peer to peer, could you please let me know the outcome, that way I can update the patients chart.  Thank you [facility]

## 2017-10-16 LAB — CA 125: Cancer Antigen (CA) 125: 775.9 U/mL — ABNORMAL HIGH (ref 0.0–38.1)

## 2017-10-18 ENCOUNTER — Inpatient Hospital Stay: Payer: Medicare PPO

## 2017-10-18 ENCOUNTER — Ambulatory Visit: Payer: PRIVATE HEALTH INSURANCE

## 2017-10-18 ENCOUNTER — Other Ambulatory Visit: Payer: Self-pay | Admitting: Hematology and Oncology

## 2017-10-18 VITALS — BP 146/81 | HR 95 | Temp 97.7°F | Resp 18 | Wt 116.0 lb

## 2017-10-18 DIAGNOSIS — Z5111 Encounter for antineoplastic chemotherapy: Secondary | ICD-10-CM | POA: Diagnosis not present

## 2017-10-18 DIAGNOSIS — C482 Malignant neoplasm of peritoneum, unspecified: Secondary | ICD-10-CM

## 2017-10-18 DIAGNOSIS — D509 Iron deficiency anemia, unspecified: Secondary | ICD-10-CM

## 2017-10-18 LAB — FOLATE RBC
FOLATE, RBC: 784 ng/mL (ref >498)
Folate, Hemolysate: 232.1 ng/mL
Hematocrit: 29.6 % — ABNORMAL LOW (ref 34.0–46.6)

## 2017-10-18 MED ORDER — FAMOTIDINE IN NACL 20-0.9 MG/50ML-% IV SOLN
20.0000 mg | Freq: Once | INTRAVENOUS | Status: AC
  Administered 2017-10-18: 20 mg via INTRAVENOUS

## 2017-10-18 MED ORDER — CYANOCOBALAMIN 1000 MCG/ML IJ SOLN
INTRAMUSCULAR | Status: AC
  Filled 2017-10-18: qty 1

## 2017-10-18 MED ORDER — CYANOCOBALAMIN 1000 MCG/ML IJ SOLN
1000.0000 ug | Freq: Once | INTRAMUSCULAR | Status: AC
  Administered 2017-10-18: 1000 ug via INTRAMUSCULAR

## 2017-10-18 MED ORDER — SODIUM CHLORIDE 0.9 % IV SOLN
Freq: Once | INTRAVENOUS | Status: AC
  Administered 2017-10-18: 10:00:00 via INTRAVENOUS
  Filled 2017-10-18: qty 5

## 2017-10-18 MED ORDER — PALONOSETRON HCL INJECTION 0.25 MG/5ML
0.2500 mg | Freq: Once | INTRAVENOUS | Status: AC
  Administered 2017-10-18: 0.25 mg via INTRAVENOUS

## 2017-10-18 MED ORDER — CARBOPLATIN CHEMO INJECTION 600 MG/60ML
407.4000 mg | Freq: Once | INTRAVENOUS | Status: AC
  Administered 2017-10-18: 410 mg via INTRAVENOUS
  Filled 2017-10-18: qty 41

## 2017-10-18 MED ORDER — SODIUM CHLORIDE 0.9% FLUSH
10.0000 mL | INTRAVENOUS | Status: DC | PRN
  Administered 2017-10-18: 10 mL
  Filled 2017-10-18: qty 10

## 2017-10-18 MED ORDER — PALONOSETRON HCL INJECTION 0.25 MG/5ML
INTRAVENOUS | Status: AC
  Filled 2017-10-18: qty 5

## 2017-10-18 MED ORDER — HEPARIN SOD (PORK) LOCK FLUSH 100 UNIT/ML IV SOLN
500.0000 [IU] | Freq: Once | INTRAVENOUS | Status: AC | PRN
  Administered 2017-10-18: 500 [IU]
  Filled 2017-10-18: qty 5

## 2017-10-18 MED ORDER — SODIUM CHLORIDE 0.9 % IV SOLN
Freq: Once | INTRAVENOUS | Status: AC
  Administered 2017-10-18: 09:00:00 via INTRAVENOUS

## 2017-10-18 MED ORDER — DIPHENHYDRAMINE HCL 50 MG/ML IJ SOLN
INTRAMUSCULAR | Status: AC
  Filled 2017-10-18: qty 1

## 2017-10-18 MED ORDER — FAMOTIDINE IN NACL 20-0.9 MG/50ML-% IV SOLN
INTRAVENOUS | Status: AC
  Filled 2017-10-18: qty 50

## 2017-10-18 MED ORDER — DIPHENHYDRAMINE HCL 50 MG/ML IJ SOLN
50.0000 mg | Freq: Once | INTRAMUSCULAR | Status: AC
  Administered 2017-10-18: 50 mg via INTRAVENOUS

## 2017-10-18 MED ORDER — SODIUM CHLORIDE 0.9 % IV SOLN
175.0000 mg/m2 | Freq: Once | INTRAVENOUS | Status: AC
  Administered 2017-10-18: 270 mg via INTRAVENOUS
  Filled 2017-10-18: qty 45

## 2017-10-18 NOTE — Patient Instructions (Signed)
[facility] Discharge Instructions for Patients Receiving Chemotherapy  Today you received the following chemotherapy agents Taxol, Carbo  To help prevent nausea and vomiting after your treatment, we encourage you to take your nausea medication as prescribed by MD   If you develop nausea and vomiting that is not controlled by your nausea medication, call the clinic.   BELOW ARE SYMPTOMS THAT SHOULD BE REPORTED IMMEDIATELY:  *FEVER GREATER THAN 100.5 F  *CHILLS WITH OR WITHOUT FEVER  NAUSEA AND VOMITING THAT IS NOT CONTROLLED WITH YOUR NAUSEA MEDICATION  *UNUSUAL SHORTNESS OF BREATH  *UNUSUAL BRUISING OR BLEEDING  TENDERNESS IN MOUTH AND THROAT WITH OR WITHOUT PRESENCE OF ULCERS  *URINARY PROBLEMS  *BOWEL PROBLEMS  UNUSUAL RASH Items with * indicate a potential emergency and should be followed up as soon as possible.  Feel free to call the clinic should you have any questions or concerns. The clinic phone number is [phone].  Please show the CHEMO ALERT CARD at check-in to the Emergency Department and triage nurse.  (Taxol) Paclitaxel injection What is this medicine? PACLITAXEL (PAK li TAX el) is a chemotherapy drug. It targets fast dividing cells, like cancer cells, and causes these cells to die. This medicine is used to treat ovarian cancer, breast cancer, and other cancers. This medicine may be used for other purposes; ask your health care provider or pharmacist if you have questions. COMMON BRAND NAME(S): Onxol, Taxol What should I tell my health care provider before I take this medicine? They need to know if you have any of these conditions: -blood disorders -irregular heartbeat -infection (especially a virus infection such as chickenpox, cold sores, or herpes) -liver disease -previous or ongoing radiation therapy -an unusual or allergic reaction to paclitaxel, alcohol, polyoxyethylated castor oil, other chemotherapy agents, other medicines,  foods, dyes, or preservatives -pregnant or trying to get pregnant -breast-feeding How should I use this medicine? This drug is given as an infusion into a vein. It is administered in a hospital or clinic by a specially trained health care professional. Talk to your pediatrician regarding the use of this medicine in children. Special care may be needed. Overdosage: If you think you have taken too much of this medicine contact a poison control center or emergency room at once. NOTE: This medicine is only for you. Do not share this medicine with others. What if I miss a dose? It is important not to miss your dose. Call your doctor or health care professional if you are unable to keep an appointment. What may interact with this medicine? Do not take this medicine with any of the following medications: -disulfiram -metronidazole This medicine may also interact with the following medications: -cyclosporine -diazepam -ketoconazole -medicines to increase blood counts like filgrastim, pegfilgrastim, sargramostim -other chemotherapy drugs like cisplatin, doxorubicin, epirubicin, etoposide, teniposide, vincristine -quinidine -testosterone -vaccines -verapamil Talk to your doctor or health care professional before taking any of these medicines: -acetaminophen -aspirin -ibuprofen -ketoprofen -naproxen This list may not describe all possible interactions. Give your health care provider a list of all the medicines, herbs, non-prescription drugs, or dietary supplements you use. Also tell them if you smoke, drink alcohol, or use illegal drugs. Some items may interact with your medicine. What should I watch for while using this medicine? Your condition will be monitored carefully while you are receiving this medicine. You will need important blood work done while you are taking this medicine. This medicine can cause serious allergic reactions. To reduce your risk you   will need to take other  medicine(s) before treatment with this medicine. If you experience allergic reactions like skin rash, itching or hives, swelling of the face, lips, or tongue, tell your doctor or health care professional right away. In some cases, you may be given additional medicines to help with side effects. Follow all directions for their use. This drug may make you feel generally unwell. This is not uncommon, as chemotherapy can affect healthy cells as well as cancer cells. Report any side effects. Continue your course of treatment even though you feel ill unless your doctor tells you to stop. Call your doctor or health care professional for advice if you get a fever, chills or sore throat, or other symptoms of a cold or flu. Do not treat yourself. This drug decreases your body's ability to fight infections. Try to avoid being around people who are sick. This medicine may increase your risk to bruise or bleed. Call your doctor or health care professional if you notice any unusual bleeding. Be careful brushing and flossing your teeth or using a toothpick because you may get an infection or bleed more easily. If you have any dental work done, tell your dentist you are receiving this medicine. Avoid taking products that contain aspirin, acetaminophen, ibuprofen, naproxen, or ketoprofen unless instructed by your doctor. These medicines may hide a fever. Do not become pregnant while taking this medicine. Women should inform their doctor if they wish to become pregnant or think they might be pregnant. There is a potential for serious side effects to an unborn child. Talk to your health care professional or pharmacist for more information. Do not breast-feed an infant while taking this medicine. Men are advised not to father a child while receiving this medicine. This product may contain alcohol. Ask your pharmacist or healthcare provider if this medicine contains alcohol. Be sure to tell all healthcare providers you are  taking this medicine. Certain medicines, like metronidazole and disulfiram, can cause an unpleasant reaction when taken with alcohol. The reaction includes flushing, headache, nausea, vomiting, sweating, and increased thirst. The reaction can last from 30 minutes to several hours. What side effects may I notice from receiving this medicine? Side effects that you should report to your doctor or health care professional as soon as possible: -allergic reactions like skin rash, itching or hives, swelling of the face, lips, or tongue -low blood counts - This drug may decrease the number of white blood cells, red blood cells and platelets. You may be at increased risk for infections and bleeding. -signs of infection - fever or chills, cough, sore throat, pain or difficulty passing urine -signs of decreased platelets or bleeding - bruising, pinpoint red spots on the skin, black, tarry stools, nosebleeds -signs of decreased red blood cells - unusually weak or tired, fainting spells, lightheadedness -breathing problems -chest pain -high or low blood pressure -mouth sores -nausea and vomiting -pain, swelling, redness or irritation at the injection site -pain, tingling, numbness in the hands or feet -slow or irregular heartbeat -swelling of the ankle, feet, hands Side effects that usually do not require medical attention (report to your doctor or health care professional if they continue or are bothersome): -bone pain -complete hair loss including hair on your head, underarms, pubic hair, eyebrows, and eyelashes -changes in the color of fingernails -diarrhea -loosening of the fingernails -loss of appetite -muscle or joint pain -red flush to skin -sweating This list may not describe all possible side effects. Call your doctor for   medical advice about side effects. You may report side effects to FDA at 1-800-FDA-1088. Where should I keep my medicine? This drug is given in a hospital or clinic and  will not be stored at home. NOTE: This sheet is a summary. It may not cover all possible information. If you have questions about this medicine, talk to your doctor, pharmacist, or health care provider.  2018 Elsevier/Gold Standard (2015-03-26 19:58:00)  (Carbo) Carboplatin injection What is this medicine? CARBOPLATIN (KAR boe pla tin) is a chemotherapy drug. It targets fast dividing cells, like cancer cells, and causes these cells to die. This medicine is used to treat ovarian cancer and many other cancers. This medicine may be used for other purposes; ask your health care provider or pharmacist if you have questions. COMMON BRAND NAME(S): Paraplatin What should I tell my health care provider before I take this medicine? They need to know if you have any of these conditions: -blood disorders -hearing problems -kidney disease -recent or ongoing radiation therapy -an unusual or allergic reaction to carboplatin, cisplatin, other chemotherapy, other medicines, foods, dyes, or preservatives -pregnant or trying to get pregnant -breast-feeding How should I use this medicine? This drug is usually given as an infusion into a vein. It is administered in a hospital or clinic by a specially trained health care professional. Talk to your pediatrician regarding the use of this medicine in children. Special care may be needed. Overdosage: If you think you have taken too much of this medicine contact a poison control center or emergency room at once. NOTE: This medicine is only for you. Do not share this medicine with others. What if I miss a dose? It is important not to miss a dose. Call your doctor or health care professional if you are unable to keep an appointment. What may interact with this medicine? -medicines for seizures -medicines to increase blood counts like filgrastim, pegfilgrastim, sargramostim -some antibiotics like amikacin, gentamicin, neomycin, streptomycin,  tobramycin -vaccines Talk to your doctor or health care professional before taking any of these medicines: -acetaminophen -aspirin -ibuprofen -ketoprofen -naproxen This list may not describe all possible interactions. Give your health care provider a list of all the medicines, herbs, non-prescription drugs, or dietary supplements you use. Also tell them if you smoke, drink alcohol, or use illegal drugs. Some items may interact with your medicine. What should I watch for while using this medicine? Your condition will be monitored carefully while you are receiving this medicine. You will need important blood work done while you are taking this medicine. This drug may make you feel generally unwell. This is not uncommon, as chemotherapy can affect healthy cells as well as cancer cells. Report any side effects. Continue your course of treatment even though you feel ill unless your doctor tells you to stop. In some cases, you may be given additional medicines to help with side effects. Follow all directions for their use. Call your doctor or health care professional for advice if you get a fever, chills or sore throat, or other symptoms of a cold or flu. Do not treat yourself. This drug decreases your body's ability to fight infections. Try to avoid being around people who are sick. This medicine may increase your risk to bruise or bleed. Call your doctor or health care professional if you notice any unusual bleeding. Be careful brushing and flossing your teeth or using a toothpick because you may get an infection or bleed more easily. If you have any dental work done,   tell your dentist you are receiving this medicine. Avoid taking products that contain aspirin, acetaminophen, ibuprofen, naproxen, or ketoprofen unless instructed by your doctor. These medicines may hide a fever. Do not become pregnant while taking this medicine. Women should inform their doctor if they wish to become pregnant or think  they might be pregnant. There is a potential for serious side effects to an unborn child. Talk to your health care professional or pharmacist for more information. Do not breast-feed an infant while taking this medicine. What side effects may I notice from receiving this medicine? Side effects that you should report to your doctor or health care professional as soon as possible: -allergic reactions like skin rash, itching or hives, swelling of the face, lips, or tongue -signs of infection - fever or chills, cough, sore throat, pain or difficulty passing urine -signs of decreased platelets or bleeding - bruising, pinpoint red spots on the skin, black, tarry stools, nosebleeds -signs of decreased red blood cells - unusually weak or tired, fainting spells, lightheadedness -breathing problems -changes in hearing -changes in vision -chest pain -high blood pressure -low blood counts - This drug may decrease the number of white blood cells, red blood cells and platelets. You may be at increased risk for infections and bleeding. -nausea and vomiting -pain, swelling, redness or irritation at the injection site -pain, tingling, numbness in the hands or feet -problems with balance, talking, walking -trouble passing urine or change in the amount of urine Side effects that usually do not require medical attention (report to your doctor or health care professional if they continue or are bothersome): -hair loss -loss of appetite -metallic taste in the mouth or changes in taste This list may not describe all possible side effects. Call your doctor for medical advice about side effects. You may report side effects to FDA at 1-800-FDA-1088. Where should I keep my medicine? This drug is given in a hospital or clinic and will not be stored at home. NOTE: This sheet is a summary. It may not cover all possible information. If you have questions about this medicine, talk to your doctor, pharmacist, or health care  provider.  2018 Elsevier/Gold Standard (2007-08-30 14:38:05)     

## 2017-10-19 ENCOUNTER — Telehealth: Payer: Self-pay | Admitting: *Deleted

## 2017-10-19 ENCOUNTER — Telehealth: Payer: Self-pay | Admitting: Oncology

## 2017-10-19 NOTE — Telephone Encounter (Signed)
-----   Message from Belva Chimes, RN sent at 10/18/2017  4:03 PM EDT ----- Regarding: Jo Parker 1st time Taxol/Carbo First time Taxol/Carbo Patient tolerated well.

## 2017-10-19 NOTE — Telephone Encounter (Signed)
Patient's son, [name], left a message asking for Dr. Alvy Bimler to call the insurance company at [phone] to try to have Udenyca approved.  Called him back and advised him that Dr. Alvy Bimler had already done a peer to peer call with the insurance company and the medication was denied.  [name] asked what the plan is now.  Will she receive a different medication?  Advised him that Dr. Alvy Bimler will be contacted and that I will call him back.

## 2017-10-19 NOTE — Telephone Encounter (Signed)
Called patient for chemo follow up. Left message to call Dr Calton Dach nurse if she has any questions or concerns

## 2017-10-19 NOTE — Telephone Encounter (Signed)
No plan except to boost her blood count with vitamin B12 as prescribed

## 2017-10-19 NOTE — Telephone Encounter (Signed)
Called Jo Parker and advised him of note below.  He verbalized understanding and agreement.

## 2017-10-20 ENCOUNTER — Ambulatory Visit: Payer: PRIVATE HEALTH INSURANCE

## 2017-10-20 ENCOUNTER — Telehealth: Payer: Self-pay | Admitting: Oncology

## 2017-10-20 NOTE — Telephone Encounter (Signed)
Jo Parker and her son came to the clinic today because they were called by [name] at [facility] telling them that the [place] injection had been approved.  They were not sure if her injection appointment had been canceled.  Apologized to them having to make a trip to the cancer center and advised them that the appointment was canceled and that we had not received the approval from [facility].  Also let them know that I would call [employer] and check on the status.  They verbalized understanding and agreement.  [facility] and was told that [name] is still denied.  They do not have any record of it being approved. Left a message for patient's son, Jo Parker.

## 2017-10-21 ENCOUNTER — Ambulatory Visit: Payer: PRIVATE HEALTH INSURANCE | Admitting: Gynecologic Oncology

## 2017-10-21 ENCOUNTER — Telehealth: Payer: Self-pay | Admitting: Oncology

## 2017-10-21 NOTE — Telephone Encounter (Signed)
Patient's son, [name], called and said he has talked to [facility] again about the Udenycainjection.  They said that we can fax supporting documentation as to why she needs the injections to [phone], reference number [id number].  Advised him that Dr. Alvy Bimler has already done a peer to peer with the medical director at [facility] and it was still denied and that there really is nothing we can do.  He said he will check his flex spending account to see if they can pay out of pocket.  He also said his mother is in more pain.  She is taking hydrocodone-acetaminophen 10-325 mg  - 1 tablet q 3 hours.  He said she is not able to rest and she will fall asleep mid sentence than wake up suddenly from a sharp pain in her abdomen.  He said she was having these pains before having chemotherapy but they are more frequent now.  Asked if she is having regular bowel movements and he was not sure.  She is taking the stool softener and Miralax.  Also asked about fever and he said she has not had any.  Advised him that Dr. Alvy Bimler will be notified and that we will call him back.

## 2017-10-22 ENCOUNTER — Inpatient Hospital Stay (HOSPITAL_COMMUNITY)
Admission: EM | Admit: 2017-10-22 | Discharge: 2017-10-27 | DRG: 375 | Disposition: A | Discharge status: Home Health | Payer: Medicare PPO | Attending: Internal Medicine | Admitting: Internal Medicine

## 2017-10-22 ENCOUNTER — Encounter (HOSPITAL_COMMUNITY): Payer: Self-pay | Admitting: Nurse Practitioner

## 2017-10-22 DIAGNOSIS — Z7982 Long term (current) use of aspirin: Secondary | ICD-10-CM

## 2017-10-22 DIAGNOSIS — F419 Anxiety disorder, unspecified: Secondary | ICD-10-CM | POA: Diagnosis present

## 2017-10-22 DIAGNOSIS — F17210 Nicotine dependence, cigarettes, uncomplicated: Secondary | ICD-10-CM | POA: Diagnosis present

## 2017-10-22 DIAGNOSIS — Z8049 Family history of malignant neoplasm of other genital organs: Secondary | ICD-10-CM

## 2017-10-22 DIAGNOSIS — E538 Deficiency of other specified B group vitamins: Secondary | ICD-10-CM | POA: Diagnosis present

## 2017-10-22 DIAGNOSIS — I719 Aortic aneurysm of unspecified site, without rupture: Secondary | ICD-10-CM | POA: Diagnosis present

## 2017-10-22 DIAGNOSIS — J449 Chronic obstructive pulmonary disease, unspecified: Secondary | ICD-10-CM

## 2017-10-22 DIAGNOSIS — D61818 Other pancytopenia: Secondary | ICD-10-CM | POA: Diagnosis present

## 2017-10-22 DIAGNOSIS — E876 Hypokalemia: Secondary | ICD-10-CM | POA: Diagnosis not present

## 2017-10-22 DIAGNOSIS — R180 Malignant ascites: Secondary | ICD-10-CM | POA: Diagnosis present

## 2017-10-22 DIAGNOSIS — I341 Nonrheumatic mitral (valve) prolapse: Secondary | ICD-10-CM | POA: Diagnosis present

## 2017-10-22 DIAGNOSIS — E440 Moderate protein-calorie malnutrition: Secondary | ICD-10-CM

## 2017-10-22 DIAGNOSIS — D539 Nutritional anemia, unspecified: Secondary | ICD-10-CM | POA: Diagnosis present

## 2017-10-22 DIAGNOSIS — D649 Anemia, unspecified: Secondary | ICD-10-CM

## 2017-10-22 DIAGNOSIS — E43 Unspecified severe protein-calorie malnutrition: Secondary | ICD-10-CM

## 2017-10-22 DIAGNOSIS — K5909 Other constipation: Secondary | ICD-10-CM | POA: Diagnosis present

## 2017-10-22 DIAGNOSIS — I10 Essential (primary) hypertension: Secondary | ICD-10-CM | POA: Diagnosis present

## 2017-10-22 DIAGNOSIS — R64 Cachexia: Secondary | ICD-10-CM | POA: Diagnosis present

## 2017-10-22 DIAGNOSIS — M1990 Unspecified osteoarthritis, unspecified site: Secondary | ICD-10-CM | POA: Diagnosis present

## 2017-10-22 DIAGNOSIS — F329 Major depressive disorder, single episode, unspecified: Secondary | ICD-10-CM | POA: Diagnosis present

## 2017-10-22 DIAGNOSIS — C786 Secondary malignant neoplasm of retroperitoneum and peritoneum: Secondary | ICD-10-CM | POA: Diagnosis not present

## 2017-10-22 DIAGNOSIS — C800 Disseminated malignant neoplasm, unspecified: Secondary | ICD-10-CM | POA: Diagnosis present

## 2017-10-22 DIAGNOSIS — Z9889 Other specified postprocedural states: Secondary | ICD-10-CM

## 2017-10-22 DIAGNOSIS — A419 Sepsis, unspecified organism: Secondary | ICD-10-CM | POA: Diagnosis present

## 2017-10-22 DIAGNOSIS — Z7951 Long term (current) use of inhaled steroids: Secondary | ICD-10-CM

## 2017-10-22 DIAGNOSIS — C482 Malignant neoplasm of peritoneum, unspecified: Secondary | ICD-10-CM | POA: Diagnosis present

## 2017-10-22 DIAGNOSIS — J90 Pleural effusion, not elsewhere classified: Secondary | ICD-10-CM

## 2017-10-22 DIAGNOSIS — E7800 Pure hypercholesterolemia, unspecified: Secondary | ICD-10-CM | POA: Diagnosis present

## 2017-10-22 DIAGNOSIS — R188 Other ascites: Secondary | ICD-10-CM

## 2017-10-22 DIAGNOSIS — D509 Iron deficiency anemia, unspecified: Secondary | ICD-10-CM | POA: Diagnosis present

## 2017-10-22 DIAGNOSIS — E785 Hyperlipidemia, unspecified: Secondary | ICD-10-CM

## 2017-10-22 DIAGNOSIS — Z8542 Personal history of malignant neoplasm of other parts of uterus: Secondary | ICD-10-CM

## 2017-10-22 DIAGNOSIS — D518 Other vitamin B12 deficiency anemias: Secondary | ICD-10-CM

## 2017-10-22 DIAGNOSIS — R6520 Severe sepsis without septic shock: Secondary | ICD-10-CM

## 2017-10-22 DIAGNOSIS — Z801 Family history of malignant neoplasm of trachea, bronchus and lung: Secondary | ICD-10-CM

## 2017-10-22 DIAGNOSIS — C569 Malignant neoplasm of unspecified ovary: Secondary | ICD-10-CM | POA: Diagnosis present

## 2017-10-22 DIAGNOSIS — I959 Hypotension, unspecified: Secondary | ICD-10-CM | POA: Diagnosis not present

## 2017-10-22 DIAGNOSIS — Z6820 Body mass index (BMI) 20.0-20.9, adult: Secondary | ICD-10-CM

## 2017-10-22 DIAGNOSIS — Z803 Family history of malignant neoplasm of breast: Secondary | ICD-10-CM

## 2017-10-22 DIAGNOSIS — Z90710 Acquired absence of both cervix and uterus: Secondary | ICD-10-CM

## 2017-10-22 DIAGNOSIS — Z800 Family history of malignant neoplasm of digestive organs: Secondary | ICD-10-CM

## 2017-10-22 HISTORY — DX: Hyperlipidemia, unspecified: E78.5

## 2017-10-22 HISTORY — DX: Chronic obstructive pulmonary disease, unspecified: J44.9

## 2017-10-22 NOTE — ED Triage Notes (Signed)
Pt and family state that they had a temp of 100.54F at home with chills. Added that they were advised at the [facility] to come in to the ED should she experience such symptoms, she had her first round of chemo therapy 5 days ago.

## 2017-10-23 ENCOUNTER — Other Ambulatory Visit: Payer: Self-pay

## 2017-10-23 ENCOUNTER — Emergency Department (HOSPITAL_COMMUNITY): Payer: Medicare PPO

## 2017-10-23 ENCOUNTER — Encounter (HOSPITAL_COMMUNITY): Payer: Self-pay | Admitting: Internal Medicine

## 2017-10-23 DIAGNOSIS — R531 Weakness: Secondary | ICD-10-CM | POA: Diagnosis not present

## 2017-10-23 DIAGNOSIS — D539 Nutritional anemia, unspecified: Secondary | ICD-10-CM | POA: Diagnosis not present

## 2017-10-23 DIAGNOSIS — Z7982 Long term (current) use of aspirin: Secondary | ICD-10-CM | POA: Diagnosis not present

## 2017-10-23 DIAGNOSIS — J449 Chronic obstructive pulmonary disease, unspecified: Secondary | ICD-10-CM | POA: Diagnosis present

## 2017-10-23 DIAGNOSIS — Z6820 Body mass index (BMI) 20.0-20.9, adult: Secondary | ICD-10-CM | POA: Diagnosis not present

## 2017-10-23 DIAGNOSIS — D61818 Other pancytopenia: Secondary | ICD-10-CM | POA: Diagnosis present

## 2017-10-23 DIAGNOSIS — C786 Secondary malignant neoplasm of retroperitoneum and peritoneum: Secondary | ICD-10-CM | POA: Diagnosis present

## 2017-10-23 DIAGNOSIS — Z8049 Family history of malignant neoplasm of other genital organs: Secondary | ICD-10-CM | POA: Diagnosis not present

## 2017-10-23 DIAGNOSIS — I719 Aortic aneurysm of unspecified site, without rupture: Secondary | ICD-10-CM | POA: Diagnosis present

## 2017-10-23 DIAGNOSIS — E785 Hyperlipidemia, unspecified: Secondary | ICD-10-CM | POA: Diagnosis present

## 2017-10-23 DIAGNOSIS — R64 Cachexia: Secondary | ICD-10-CM | POA: Diagnosis present

## 2017-10-23 DIAGNOSIS — A419 Sepsis, unspecified organism: Secondary | ICD-10-CM | POA: Diagnosis present

## 2017-10-23 DIAGNOSIS — E440 Moderate protein-calorie malnutrition: Secondary | ICD-10-CM | POA: Diagnosis present

## 2017-10-23 DIAGNOSIS — R6520 Severe sepsis without septic shock: Secondary | ICD-10-CM

## 2017-10-23 DIAGNOSIS — R0600 Dyspnea, unspecified: Secondary | ICD-10-CM | POA: Diagnosis not present

## 2017-10-23 DIAGNOSIS — R180 Malignant ascites: Secondary | ICD-10-CM | POA: Diagnosis present

## 2017-10-23 DIAGNOSIS — I341 Nonrheumatic mitral (valve) prolapse: Secondary | ICD-10-CM | POA: Diagnosis present

## 2017-10-23 DIAGNOSIS — I10 Essential (primary) hypertension: Secondary | ICD-10-CM | POA: Diagnosis present

## 2017-10-23 DIAGNOSIS — E7800 Pure hypercholesterolemia, unspecified: Secondary | ICD-10-CM | POA: Diagnosis present

## 2017-10-23 DIAGNOSIS — D518 Other vitamin B12 deficiency anemias: Secondary | ICD-10-CM | POA: Diagnosis not present

## 2017-10-23 DIAGNOSIS — F419 Anxiety disorder, unspecified: Secondary | ICD-10-CM | POA: Diagnosis present

## 2017-10-23 DIAGNOSIS — Z803 Family history of malignant neoplasm of breast: Secondary | ICD-10-CM | POA: Diagnosis not present

## 2017-10-23 DIAGNOSIS — E538 Deficiency of other specified B group vitamins: Secondary | ICD-10-CM | POA: Diagnosis not present

## 2017-10-23 DIAGNOSIS — C482 Malignant neoplasm of peritoneum, unspecified: Secondary | ICD-10-CM | POA: Diagnosis not present

## 2017-10-23 DIAGNOSIS — K5909 Other constipation: Secondary | ICD-10-CM | POA: Diagnosis not present

## 2017-10-23 DIAGNOSIS — Z7951 Long term (current) use of inhaled steroids: Secondary | ICD-10-CM | POA: Diagnosis not present

## 2017-10-23 DIAGNOSIS — F329 Major depressive disorder, single episode, unspecified: Secondary | ICD-10-CM | POA: Diagnosis present

## 2017-10-23 DIAGNOSIS — C800 Disseminated malignant neoplasm, unspecified: Secondary | ICD-10-CM | POA: Diagnosis not present

## 2017-10-23 DIAGNOSIS — Z8542 Personal history of malignant neoplasm of other parts of uterus: Secondary | ICD-10-CM | POA: Diagnosis not present

## 2017-10-23 DIAGNOSIS — Z801 Family history of malignant neoplasm of trachea, bronchus and lung: Secondary | ICD-10-CM | POA: Diagnosis not present

## 2017-10-23 DIAGNOSIS — Z800 Family history of malignant neoplasm of digestive organs: Secondary | ICD-10-CM | POA: Diagnosis not present

## 2017-10-23 DIAGNOSIS — J90 Pleural effusion, not elsewhere classified: Secondary | ICD-10-CM | POA: Diagnosis present

## 2017-10-23 DIAGNOSIS — F17210 Nicotine dependence, cigarettes, uncomplicated: Secondary | ICD-10-CM | POA: Diagnosis present

## 2017-10-23 DIAGNOSIS — C569 Malignant neoplasm of unspecified ovary: Secondary | ICD-10-CM | POA: Diagnosis present

## 2017-10-23 DIAGNOSIS — E878 Other disorders of electrolyte and fluid balance, not elsewhere classified: Secondary | ICD-10-CM | POA: Diagnosis not present

## 2017-10-23 DIAGNOSIS — Z90710 Acquired absence of both cervix and uterus: Secondary | ICD-10-CM | POA: Diagnosis not present

## 2017-10-23 DIAGNOSIS — M1990 Unspecified osteoarthritis, unspecified site: Secondary | ICD-10-CM | POA: Diagnosis present

## 2017-10-23 DIAGNOSIS — R188 Other ascites: Secondary | ICD-10-CM | POA: Diagnosis not present

## 2017-10-23 DIAGNOSIS — Z9889 Other specified postprocedural states: Secondary | ICD-10-CM | POA: Diagnosis not present

## 2017-10-23 HISTORY — DX: Chronic obstructive pulmonary disease, unspecified: J44.9

## 2017-10-23 HISTORY — DX: Hyperlipidemia, unspecified: E78.5

## 2017-10-23 LAB — URINALYSIS, ROUTINE W REFLEX MICROSCOPIC
Bilirubin Urine: NEGATIVE
GLUCOSE, UA: NEGATIVE mg/dL
Hgb urine dipstick: NEGATIVE
KETONES UR: 5 mg/dL — AB
Nitrite: NEGATIVE
PROTEIN: NEGATIVE mg/dL
Specific Gravity, Urine: 1.02 (ref 1.005–1.030)
pH: 6 (ref 5.0–8.0)

## 2017-10-23 LAB — COMPREHENSIVE METABOLIC PANEL
ALT: 21 U/L (ref 14–54)
AST: 30 U/L (ref 15–41)
Albumin: 2.5 g/dL — ABNORMAL LOW (ref 3.5–5.0)
Alkaline Phosphatase: 59 U/L (ref 38–126)
Anion gap: 11 (ref 5–15)
BUN: 17 mg/dL (ref 6–20)
CALCIUM: 8.4 mg/dL — AB (ref 8.9–10.3)
CO2: 23 mmol/L (ref 22–32)
CREATININE: 0.58 mg/dL (ref 0.44–1.00)
Chloride: 98 mmol/L — ABNORMAL LOW (ref 101–111)
Glucose, Bld: 140 mg/dL — ABNORMAL HIGH (ref 65–99)
Potassium: 3.5 mmol/L (ref 3.5–5.1)
Sodium: 132 mmol/L — ABNORMAL LOW (ref 135–145)
Total Bilirubin: 0.6 mg/dL (ref 0.3–1.2)
Total Protein: 6.4 g/dL — ABNORMAL LOW (ref 6.5–8.1)

## 2017-10-23 LAB — CBC WITH DIFFERENTIAL/PLATELET
Basophils Absolute: 0 10*3/uL (ref 0.0–0.1)
Basophils Relative: 0 %
EOS PCT: 4 %
Eosinophils Absolute: 0.3 10*3/uL (ref 0.0–0.7)
HEMATOCRIT: 27.8 % — AB (ref 36.0–46.0)
Hemoglobin: 8.9 g/dL — ABNORMAL LOW (ref 12.0–15.0)
Lymphocytes Relative: 16 %
Lymphs Abs: 1 10*3/uL (ref 0.7–4.0)
MCH: 19 pg — ABNORMAL LOW (ref 26.0–34.0)
MCHC: 32 g/dL (ref 30.0–36.0)
MCV: 59.4 fL — AB (ref 78.0–100.0)
MONO ABS: 0.1 10*3/uL (ref 0.1–1.0)
Monocytes Relative: 1 %
NEUTROS ABS: 5 10*3/uL (ref 1.7–7.7)
Neutrophils Relative %: 79 %
PLATELETS: 357 10*3/uL (ref 150–400)
RBC: 4.68 MIL/uL (ref 3.87–5.11)
RDW: 17.2 % — AB (ref 11.5–15.5)
WBC: 6.4 10*3/uL (ref 4.0–10.5)

## 2017-10-23 LAB — PROTIME-INR
INR: 1.23
Prothrombin Time: 15.4 seconds — ABNORMAL HIGH (ref 11.4–15.2)

## 2017-10-23 LAB — I-STAT CG4 LACTIC ACID, ED: LACTIC ACID, VENOUS: 0.89 mmol/L (ref 0.5–1.9)

## 2017-10-23 MED ORDER — IOPAMIDOL (ISOVUE-370) INJECTION 76%
100.0000 mL | Freq: Once | INTRAVENOUS | Status: AC | PRN
  Administered 2017-10-23: 100 mL via INTRAVENOUS

## 2017-10-23 MED ORDER — MOMETASONE FURO-FORMOTEROL FUM 200-5 MCG/ACT IN AERO
2.0000 | INHALATION_SPRAY | Freq: Two times a day (BID) | RESPIRATORY_TRACT | Status: DC
  Administered 2017-10-23 – 2017-10-27 (×8): 2 via RESPIRATORY_TRACT
  Filled 2017-10-23: qty 8.8

## 2017-10-23 MED ORDER — LUBIPROSTONE 8 MCG PO CAPS
8.0000 ug | ORAL_CAPSULE | Freq: Two times a day (BID) | ORAL | Status: DC
Start: 2017-10-23 — End: 2017-10-27
  Administered 2017-10-23 – 2017-10-27 (×8): 8 ug via ORAL
  Filled 2017-10-23 (×11): qty 1

## 2017-10-23 MED ORDER — SODIUM CHLORIDE 0.9 % IV SOLN
1250.0000 mg | Freq: Once | INTRAVENOUS | Status: AC
  Administered 2017-10-23: 1250 mg via INTRAVENOUS
  Filled 2017-10-23: qty 1250

## 2017-10-23 MED ORDER — ENOXAPARIN SODIUM 40 MG/0.4ML ~~LOC~~ SOLN
40.0000 mg | SUBCUTANEOUS | Status: DC
  Administered 2017-10-23 – 2017-10-26 (×4): 40 mg via SUBCUTANEOUS
  Filled 2017-10-23 (×4): qty 0.4

## 2017-10-23 MED ORDER — ASPIRIN EC 81 MG PO TBEC
81.0000 mg | DELAYED_RELEASE_TABLET | Freq: Every day | ORAL | Status: DC
  Administered 2017-10-23 – 2017-10-27 (×5): 81 mg via ORAL
  Filled 2017-10-23 (×5): qty 1

## 2017-10-23 MED ORDER — ENSURE ENLIVE PO LIQD
237.0000 mL | Freq: Two times a day (BID) | ORAL | Status: DC
  Administered 2017-10-23 – 2017-10-25 (×3): 237 mL via ORAL

## 2017-10-23 MED ORDER — IOPAMIDOL (ISOVUE-370) INJECTION 76%
INTRAVENOUS | Status: AC
  Filled 2017-10-23: qty 100

## 2017-10-23 MED ORDER — SODIUM CHLORIDE 0.9 % IV BOLUS
1000.0000 mL | Freq: Once | INTRAVENOUS | Status: AC
  Administered 2017-10-23: 1000 mL via INTRAVENOUS

## 2017-10-23 MED ORDER — PIPERACILLIN-TAZOBACTAM 3.375 G IVPB 30 MIN
3.3750 g | Freq: Once | INTRAVENOUS | Status: AC
  Administered 2017-10-23: 3.375 g via INTRAVENOUS
  Filled 2017-10-23: qty 50

## 2017-10-23 MED ORDER — CARVEDILOL 12.5 MG PO TABS
12.5000 mg | ORAL_TABLET | Freq: Two times a day (BID) | ORAL | Status: DC
  Administered 2017-10-23 – 2017-10-24 (×2): 12.5 mg via ORAL
  Filled 2017-10-23 (×2): qty 1

## 2017-10-23 MED ORDER — MECLIZINE HCL 25 MG PO TABS: 25.0000 mg | ORAL_TABLET | Freq: Three times a day (TID) | ORAL | Status: DC | PRN

## 2017-10-23 MED ORDER — VANCOMYCIN HCL IN DEXTROSE 1-5 GM/200ML-% IV SOLN
1000.0000 mg | INTRAVENOUS | Status: DC
  Administered 2017-10-24: 1000 mg via INTRAVENOUS
  Filled 2017-10-23: qty 200

## 2017-10-23 MED ORDER — PROCHLORPERAZINE MALEATE 10 MG PO TABS
10.0000 mg | ORAL_TABLET | Freq: Four times a day (QID) | ORAL | Status: DC | PRN
  Administered 2017-10-23: 10 mg via ORAL
  Filled 2017-10-23: qty 1

## 2017-10-23 MED ORDER — MELATONIN 5 MG PO TABS
2.0000 | ORAL_TABLET | Freq: Every evening | ORAL | Status: DC | PRN
  Filled 2017-10-23: qty 2

## 2017-10-23 MED ORDER — POLYETHYLENE GLYCOL 3350 17 G PO PACK: 17.0000 g | PACK | Freq: Every day | ORAL | Status: DC | PRN

## 2017-10-23 MED ORDER — SODIUM CHLORIDE 0.9 % IV BOLUS
1000.0000 mL | Freq: Once | INTRAVENOUS | Status: AC
Start: 2017-10-23 — End: 2017-10-23
  Administered 2017-10-23: 1000 mL via INTRAVENOUS

## 2017-10-23 MED ORDER — SODIUM CHLORIDE 0.9 % IV SOLN
1.0000 g | Freq: Three times a day (TID) | INTRAVENOUS | Status: DC
  Administered 2017-10-23 – 2017-10-24 (×4): 1 g via INTRAVENOUS
  Filled 2017-10-23 (×6): qty 1

## 2017-10-23 MED ORDER — HYDROCODONE-ACETAMINOPHEN 10-325 MG PO TABS
1.0000 | ORAL_TABLET | ORAL | Status: DC | PRN
  Administered 2017-10-23 – 2017-10-25 (×7): 1 via ORAL
  Filled 2017-10-23 (×8): qty 1

## 2017-10-23 MED ORDER — IOHEXOL 300 MG/ML  SOLN
30.0000 mL | Freq: Once | INTRAMUSCULAR | Status: AC | PRN
  Administered 2017-10-23: 30 mL via ORAL

## 2017-10-23 MED ORDER — VANCOMYCIN HCL IN DEXTROSE 750-5 MG/150ML-% IV SOLN: 750.0000 mg | INTRAVENOUS | Status: DC

## 2017-10-23 MED ORDER — VANCOMYCIN HCL IN DEXTROSE 1-5 GM/200ML-% IV SOLN: 1000.0000 mg | Freq: Once | INTRAVENOUS | Status: DC

## 2017-10-23 MED ORDER — SODIUM CHLORIDE 0.9 % IV SOLN
1.0000 g | Freq: Three times a day (TID) | INTRAVENOUS | Status: DC
  Filled 2017-10-23 (×2): qty 1

## 2017-10-23 MED ORDER — ATORVASTATIN CALCIUM 80 MG PO TABS
80.0000 mg | ORAL_TABLET | Freq: Every day | ORAL | Status: DC
  Administered 2017-10-23 – 2017-10-27 (×5): 80 mg via ORAL
  Filled 2017-10-23: qty 1
  Filled 2017-10-23: qty 2
  Filled 2017-10-23 (×2): qty 1
  Filled 2017-10-23 (×2): qty 2
  Filled 2017-10-23 (×2): qty 1
  Filled 2017-10-23 (×2): qty 2

## 2017-10-23 MED ORDER — CITALOPRAM HYDROBROMIDE 20 MG PO TABS
40.0000 mg | ORAL_TABLET | Freq: Every day | ORAL | Status: DC
  Administered 2017-10-23 – 2017-10-26 (×4): 40 mg via ORAL
  Filled 2017-10-23 (×4): qty 2

## 2017-10-23 NOTE — ED Notes (Signed)
Attempted to give report to [phone]. Nurse states she will call me back.

## 2017-10-23 NOTE — ED Notes (Signed)
Attempted to give report twice. No answer. [phone].

## 2017-10-23 NOTE — ED Notes (Addendum)
Report given to Charge nurse 5-E.

## 2017-10-23 NOTE — ED Provider Notes (Addendum)
[facility] DEPT Provider Note   CSN: [id number] Arrival date & time: 10/22/17  2243     History   Chief Complaint Chief Complaint  Patient presents with  . Ca Pt  . Fever    HPI Jo Parker is a 72 y.o. female.  Patient was recently diagnosed peritoneal carcinomatosis presenting from home with fever.  Family states she developed chills and fever to 100.9 last night.  She received her first chemotherapy 5 days ago.  She said generalized weakness and chills.  Her abdominal pain is unchanged.  She denies any pain with urination or blood in the urine.  She denies any cough, runny nose or sore throat.  No chest pain or shortness of breath.  She states her abdominal pain is the same as always.  She denies any sick contacts.  Denies any throat pain.  No headache.  She did not receive any antipyretics at home.  The history is provided by the patient and a relative.  Fever   Associated symptoms include congestion. Pertinent negatives include no vomiting, no headaches and no cough.    Past Medical History:  Diagnosis Date  . Anxiety and depression   . Aortic aneurysm ([place])   . Arthritis   . Carcinomatosis ([place]) 10/2017  . History of diverticulitis   . Hypercholesterolemia   . Hypertension   . Mitral valve prolapse   . Uterine cancer [facility])    had partial hysterectomy at 27    Patient Active Problem List   Diagnosis Date Noted  . Goals of care, counseling/discussion 10/13/2017  . Deficiency anemia 10/11/2017  . Iron deficiency anemia 10/08/2017  . Severe protein-calorie malnutrition ([place]) 10/08/2017  . Malignant ascites 10/08/2017  . Cancer associated pain 10/08/2017  . Tobacco abuse 10/08/2017  . Other constipation 10/08/2017  . Carcinomatosis ([place]) 10/06/2017  . Primary peritoneal carcinomatosis ([place]) 10/05/2017    Past Surgical History:  Procedure Laterality Date  . ABDOMINAL HYSTERECTOMY    . cardiac monitor implant    . CEREBRAL  ANEURYSM REPAIR    . CERVICAL SPINE SURGERY     bone spurs  . IR FLUORO GUIDE PORT INSERTION RIGHT  10/14/2017  . IR PARACENTESIS  10/14/2017  . IR US GUIDE VASC ACCESS RIGHT  10/14/2017  . PARTIAL HYSTERECTOMY  1971   "precancer"     OB History   None      Home Medications    Prior to Admission medications   Medication Sig Start Date End Date Taking? Authorizing Provider  aspirin EC 81 MG tablet Take 81 mg by mouth daily.   Yes [provider]  atorvastatin (LIPITOR) 80 MG tablet Take 80 mg by mouth daily.   Yes [provider]  budesonide-formoterol (SYMBICORT) 160-4.5 MCG/ACT inhaler Inhale 2 puffs into the lungs 2 (two) times daily. Use two puffs by mouth twice daily(rinse mouth after use)   Yes [provider]  carvedilol (COREG) 12.5 MG tablet Take 12.5 mg by mouth 2 (two) times daily with a meal.   Yes [provider]  citalopram (CELEXA) 40 MG tablet Take 40 mg by mouth daily.   Yes [provider]  dexamethasone (DECADRON) 4 MG tablet Take 5 tabs the night before and 5 tabs the morning of chemotherapy, every 3 weeks with food 10/13/17  Yes Gorsuch, Ni, MD  HYDROcodone-acetaminophen (NORCO) 10-325 MG tablet Take 1 tablet by mouth every 3 (three) hours as needed. Patient taking differently: Take 1 tablet by mouth every  3 (three) hours as needed for moderate pain or severe pain.  10/08/17  Yes Gorsuch, Ni, MD  lidocaine-prilocaine (EMLA) cream Apply to affected area once 10/13/17  Yes Gorsuch, Ni, MD  lubiprostone (AMITIZA) 8 MCG capsule Take 8 mcg by mouth 2 (two) times daily as needed for constipation.   Yes [provider]  meclizine (ANTIVERT) 25 MG tablet Take 25 mg by mouth 3 (three) times daily as needed for dizziness.   Yes [provider]  Melatonin 5 MG CAPS Take 2 capsules by mouth at bedtime as needed (sleep). Take 2 capsules at bedtime as needed   Yes [provider]  ondansetron (ZOFRAN) 4 MG tablet Take 4  mg by mouth every 6 (six) hours as needed for nausea or vomiting.   Yes [provider]  polyethylene glycol (MIRALAX / GLYCOLAX) packet Take 17 g by mouth daily as needed for mild constipation or moderate constipation.    Yes [provider]  prochlorperazine (COMPAZINE) 10 MG tablet Take 1 tablet (10 mg total) by mouth every 6 (six) hours as needed (Nausea or vomiting). 10/13/17  Yes [name], MD    Family History Family History  Problem Relation Age of Onset  . Endometrial cancer Sister 88       endometrial ca  . Lung cancer Paternal Aunt   . Breast cancer Paternal Aunt   . Breast cancer Paternal Aunt   . Stomach cancer Maternal Grandfather   . Prostate cancer Paternal Uncle   . Breast cancer Cousin   . Breast cancer Cousin   . Lung cancer Father        lung ca    Social History Social History   Tobacco Use  . Smoking status: Current Some Day Smoker    Packs/day: 1.00    Years: 55.00    Pack years: 55.00  . Smokeless tobacco: Never Used  Substance Use Topics  . Alcohol use: Yes    Comment: 1 / week  . Drug use: Never     Allergies   Patient has no known allergies.   Review of Systems Review of Systems  Constitutional: Positive for activity change, appetite change and fever.  HENT: Positive for congestion and rhinorrhea.   Eyes: Negative for visual disturbance.  Respiratory: Negative for cough, chest tightness and shortness of breath.   Gastrointestinal: Positive for abdominal pain. Negative for nausea and vomiting.  Genitourinary: Negative for decreased urine volume, dysuria, hematuria, vaginal bleeding and vaginal discharge.  Musculoskeletal: Positive for myalgias. Negative for arthralgias.  Skin: Negative for rash.  Neurological: Negative for dizziness, weakness and headaches.    all other systems are negative except as noted in the HPI and PMH.    Physical Exam Updated Vital Signs BP (!) 166/106   Pulse (!) 110   Temp 99.8 F (37.7  C) (Oral)   Resp 20   Ht 5\' 3"  (1.6 m)   Wt 52.6 kg (116 lb)   SpO2 95%   BMI 20.55 kg/m   Physical Exam  Constitutional: She is oriented to person, place, and time. She appears well-developed and well-nourished. No distress.  HENT:  Head: Normocephalic and atraumatic.  Mouth/Throat: Oropharynx is clear and moist. No oropharyngeal exudate.  Dry mucus membranes  Eyes: Pupils are equal, round, and reactive to light. Conjunctivae and EOM are normal.  Neck: Normal range of motion. Neck supple.  No meningismus.  Cardiovascular: Normal rate, normal heart sounds and intact distal pulses.  No murmur heard. Tachycardic  100s  Pulmonary/Chest: Effort normal. No respiratory distress.  Decreased at bases  Abdominal: Soft. There is tenderness. There is no rebound and no guarding.  Mild diffuse tenderness  Musculoskeletal: Normal range of motion. She exhibits no edema or tenderness.  Neurological: She is alert and oriented to person, place, and time. No cranial nerve deficit. She exhibits normal muscle tone. Coordination normal.  No ataxia on finger to nose bilaterally. No pronator drift. 5/5 strength throughout. CN 2-12 intact.Equal grip strength. Sensation intact.   Skin: Skin is warm. Capillary refill takes less than 2 seconds. No rash noted.  Psychiatric: She has a normal mood and affect. Her behavior is normal.  Nursing note and vitals reviewed.    ED Treatments / Results  Labs (all labs ordered are listed, but only abnormal results are displayed) Labs Reviewed  COMPREHENSIVE METABOLIC PANEL - Abnormal; Notable for the following components:      Result Value   Sodium 132 (*)    Chloride 98 (*)    Glucose, Bld 140 (*)    Calcium 8.4 (*)    Total Protein 6.4 (*)    Albumin 2.5 (*)    All other components within normal limits  CBC WITH DIFFERENTIAL/PLATELET - Abnormal; Notable for the following components:   Hemoglobin 8.9 (*)    HCT 27.8 (*)    MCV 59.4 (*)    MCH 19.0 (*)     RDW 17.2 (*)    All other components within normal limits  PROTIME-INR - Abnormal; Notable for the following components:   Prothrombin Time 15.4 (*)    All other components within normal limits  URINALYSIS, ROUTINE W REFLEX MICROSCOPIC - Abnormal; Notable for the following components:   APPearance HAZY (*)    Ketones, ur 5 (*)    Leukocytes, UA TRACE (*)    Bacteria, UA FEW (*)    All other components within normal limits  CULTURE, BLOOD (ROUTINE X 2)  CULTURE, BLOOD (ROUTINE X 2)  URINE CULTURE  I-STAT CG4 LACTIC ACID, ED    EKG EKG Interpretation  Date/Time:  Saturday Oct 23 2017 07:45:14 EDT Ventricular Rate:  105 PR Interval:    QRS Duration: 92 QT Interval:  345 QTC Calculation: 456 R Axis:   -51 Text Interpretation:  Sinus tachycardia Left anterior fascicular block Anterior infarct, old No previous ECGs available Confirmed by Ezequiel Essex [phone]) on 10/23/2017 8:20:06 AM   Radiology Dg Chest 2 View  Result Date: 10/23/2017 CLINICAL DATA:  Fever to 100.9 with chills. EXAM: CHEST - 2 VIEW COMPARISON:  09/03/2017 chest CT FINDINGS: The left hemidiaphragm and left heart border are obscured by and adjacent small to moderate left pleural effusion. There is probable adjacent left basilar atelectasis secondary to the effusion. Port catheter tip terminates in the distal SVC. Loop monitoring device projects over the cardiac silhouette. There is mild aortic atherosclerosis without aneurysm. Degenerative changes are seen about both shoulders. No aggressive osseous lesions. IMPRESSION: Small to moderate left pleural effusion with atelectasis. Mild aortic atherosclerosis. No acute pneumonic consolidation. Electronically Signed   By: Ashley Royalty M.D.   On: 10/23/2017 01:01   Ct Angio Chest Pe W And/or Wo Contrast  Result Date: 10/23/2017 CLINICAL DATA:  Fever and chills. Undergoing chemotherapy for primary peritoneal carcinomatosis. EXAM: CT ANGIOGRAPHY CHEST CT ABDOMEN AND PELVIS WITH  CONTRAST TECHNIQUE: Multidetector CT imaging of the chest was performed using the standard protocol during bolus administration of intravenous contrast. Multiplanar CT image reconstructions and MIPs were obtained  to evaluate the vascular anatomy. Multidetector CT imaging of the abdomen and pelvis was performed using the standard protocol during bolus administration of intravenous contrast. CONTRAST:  178mL ISOVUE-370 IOPAMIDOL (ISOVUE-370) INJECTION 76% COMPARISON:  Chest CT 09/03/2017 FINDINGS: CTA CHEST FINDINGS Cardiovascular: Right IJ central venous catheter with tip over the cavoatrial junction. Metallic device over the left anterior chest wall. Mild cardiomegaly. Mild calcified plaque over the left main coronary artery and right coronary arteries. Mild calcified plaque over the thoracic aorta. Thoracic aorta is normal in caliber without aneurysm or dissection. Pulmonary arterial system is well opacified without evidence of emboli. Mediastinum/Nodes: No significant hilar or mediastinal adenopathy. Remaining mediastinal structures are unremarkable. Subcentimeter right pericardiophrenic lymph node. Lungs/Pleura: There is minimal paraseptal emphysema over the upper lungs. There is a new moderate size left pleural effusion and new small to moderate right pleural effusion with associated compressive atelectasis in the lung bases left greater than right. Airways are within normal. Musculoskeletal: Minimal degenerate change of the spine. Review of the MIP images confirms the above findings. CT ABDOMEN and PELVIS FINDINGS Hepatobiliary: Liver and biliary tree are within normal. Gallbladder unremarkable. Pancreas: Normal. Spleen: Normal. Adrenals/Urinary Tract: Adrenal glands are normal. Kidneys are normal in size without hydronephrosis or nephrolithiasis. There is a 2.1 cm cyst over the left lower pole. Bladder is unremarkable. Stomach/Bowel: Stomach and small bowel are unremarkable. Appendix is not well visualized.  Colon is normal. Vascular/Lymphatic: Aneurysmal dilatation of the suprarenal abdominal aorta measuring 3.9 cm in AP diameter with appearance suggesting this is a pseudoaneurysm. Mild to moderate calcified plaque throughout the abdominal aorta and iliac arteries. Few small gastrohepatic and periaortic lymph nodes are present. Reproductive: Previous hysterectomy. Adnexal regions are unremarkable. Other: Moderate stable ascites. Extensive known anterior peritoneal carcinomatosis over the anterior mid to lower peritoneal cavity and pelvis. Musculoskeletal: Degenerate change of the spine and hips. Review of the MIP images confirms the above findings. IMPRESSION: No evidence of pulmonary embolism. New moderate size left effusion and new small to moderate right effusion with associated bibasilar atelectasis. Known extensive peritoneal carcinomatosis and moderate ascites. 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 Recommend correlation with prior exams and vascular surgery consultation. 2.1 cm left renal cyst. Mild cardiomegaly and minimal atherosclerotic coronary artery disease. Aortic Atherosclerosis (ICD10-I70.0) and Emphysema (ICD10-J43.9). Electronically Signed   By: Marin Olp M.D.   On: 10/23/2017 07:46   Ct Abdomen Pelvis W Contrast  Result Date: 10/23/2017 CLINICAL DATA:  Fever and chills. Undergoing chemotherapy for primary peritoneal carcinomatosis. EXAM: CT ANGIOGRAPHY CHEST CT ABDOMEN AND PELVIS WITH CONTRAST TECHNIQUE: Multidetector CT imaging of the chest was performed using the standard protocol during bolus administration of intravenous contrast. Multiplanar CT image reconstructions and MIPs were obtained to evaluate the vascular anatomy. Multidetector CT imaging of the abdomen and pelvis was performed using the standard protocol during bolus administration of intravenous contrast. CONTRAST:  128mL ISOVUE-370 IOPAMIDOL (ISOVUE-370) INJECTION 76% COMPARISON:  Chest CT 09/03/2017  FINDINGS: CTA CHEST FINDINGS Cardiovascular: Right IJ central venous catheter with tip over the cavoatrial junction. Metallic device over the left anterior chest wall. Mild cardiomegaly. Mild calcified plaque over the left main coronary artery and right coronary arteries. Mild calcified plaque over the thoracic aorta. Thoracic aorta is normal in caliber without aneurysm or dissection. Pulmonary arterial system is well opacified without evidence of emboli. Mediastinum/Nodes: No significant hilar or mediastinal adenopathy. Remaining mediastinal structures are unremarkable. Subcentimeter right pericardiophrenic lymph node. Lungs/Pleura: There is minimal paraseptal emphysema over the upper lungs.  There is a new moderate size left pleural effusion and new small to moderate right pleural effusion with associated compressive atelectasis in the lung bases left greater than right. Airways are within normal. Musculoskeletal: Minimal degenerate change of the spine. Review of the MIP images confirms the above findings. CT ABDOMEN and PELVIS FINDINGS Hepatobiliary: Liver and biliary tree are within normal. Gallbladder unremarkable. Pancreas: Normal. Spleen: Normal. Adrenals/Urinary Tract: Adrenal glands are normal. Kidneys are normal in size without hydronephrosis or nephrolithiasis. There is a 2.1 cm cyst over the left lower pole. Bladder is unremarkable. Stomach/Bowel: Stomach and small bowel are unremarkable. Appendix is not well visualized. Colon is normal. Vascular/Lymphatic: Aneurysmal dilatation of the suprarenal abdominal aorta measuring 3.9 cm in AP diameter with appearance suggesting this is a pseudoaneurysm. Mild to moderate calcified plaque throughout the abdominal aorta and iliac arteries. Few small gastrohepatic and periaortic lymph nodes are present. Reproductive: Previous hysterectomy. Adnexal regions are unremarkable. Other: Moderate stable ascites. Extensive known anterior peritoneal carcinomatosis over the  anterior mid to lower peritoneal cavity and pelvis. Musculoskeletal: Degenerate change of the spine and hips. Review of the MIP images confirms the above findings. IMPRESSION: No evidence of pulmonary embolism. New moderate size left effusion and new small to moderate right effusion with associated bibasilar atelectasis. Known extensive peritoneal carcinomatosis and moderate ascites. 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 Recommend correlation with prior exams and vascular surgery consultation. 2.1 cm left renal cyst. Mild cardiomegaly and minimal atherosclerotic coronary artery disease. Aortic Atherosclerosis (ICD10-I70.0) and Emphysema (ICD10-J43.9). Electronically Signed   By: Marin Olp M.D.   On: 10/23/2017 07:46    Procedures Procedures (including critical care time)  Medications Ordered in ED Medications  sodium chloride 0.9 % bolus 1,000 mL (has no administration in time range)  piperacillin-tazobactam (ZOSYN) IVPB 3.375 g (0 g Intravenous Stopped 10/23/17 0503)  sodium chloride 0.9 % bolus 1,000 mL (1,000 mLs Intravenous New Bag/Given 10/23/17 0425)  vancomycin (VANCOCIN) 1,250 mg in sodium chloride 0.9 % 250 mL IVPB (1,250 mg Intravenous New Bag/Given 10/23/17 0431)  iohexol (OMNIPAQUE) 300 MG/ML solution 30 mL (30 mLs Oral Contrast Given 10/23/17 0517)     Initial Impression / Assessment and Plan / ED Course  I have reviewed the triage vital signs and the nursing notes.  Pertinent labs & imaging results that were available during my care of the patient were reviewed by me and considered in my medical decision making (see chart for details).    Patient with recent chemotherapy presenting with chills and fever.  She is mildly tachycardic with dry mucous membranes.  She is given IV fluids, broad-spectrum antibiotics after cultures were obtained.  Chest x-ray shows new effusion on the left side.  She denies any cough.  Patient given IV fluids and IV antibiotics after  cultures were obtained.  Suspect pneumonia.  Will obtain CT scan to rule out pulmonary embolism given her persistent tachycardia and tachypnea.  No PE.  Imaging does show new bilateral pleural effusions left greater than right.   Patient remains in no respiratory distress no hypoxia.  She is persistently tachycardic.  Admission discussed with Dr. Herbert Moors.  CRITICAL CARE Performed by: Ezequiel Essex Total critical care time: 31* minutes Critical care time was exclusive of separately billable procedures and treating other patients. Critical care was necessary to treat or prevent imminent or life-threatening deterioration. Critical care was time spent personally by me on the following activities: development of treatment plan with patient and/or surrogate as well as  nursing, discussions with consultants, evaluation of patient's response to treatment, examination of patient, obtaining history from patient or surrogate, ordering and performing treatments and interventions, ordering and review of laboratory studies, ordering and review of radiographic studies, pulse oximetry and re-evaluation of patient's condition.  Final Clinical Impressions(s) / ED Diagnoses   Final diagnoses:  Sepsis, due to unspecified organism [facility])  Pleural effusion on left    ED Discharge Orders    None       Waylyn Tenbrink, Annie Main, MD 10/23/17 7034    Ezequiel Essex, MD 11/01/17 2118

## 2017-10-23 NOTE — Progress Notes (Signed)
Pharmacy Antibiotic Note  Jo Parker is a 72 y.o. female with peritoneal cancer on chemotherapy presented the ED on 10/22/2017 with c/o fever and chills. To start abx for suspected sepsis.  - 5/18 chest CTA: neg for PE; New moderate size left effusion and new small to moderate right effusion with associated bibasilar atelectasis. Known extensive peritoneal carcinomatosis and moderate ascites. pseudoaneurysm of the suprarenal abdominal aorta  - scr 0.58 (crcl~52), ANC 5   Plan: -  cefepime 1gm IV q8h per MD - Vancomycin 1250 mg x1 in ED, then 1000 mg IV q24h for est AUC 433, scr rounded to 1 for age _______________________________________  Height: 5\' 3"  (160 cm) Weight: 116 lb (52.6 kg) IBW/kg (Calculated) : 52.4  Temp (24hrs), Avg:99.8 F (37.7 C), Min:99.8 F (37.7 C), Max:99.8 F (37.7 C)  Recent Labs  Lab 10/23/17 0020 10/23/17 0027  WBC 6.4  --   CREATININE 0.58  --   LATICACIDVEN  --  0.89    Estimated Creatinine Clearance: 52.6 mL/min (by C-G formula based on SCr of 0.58 mg/dL).    No Known Allergies   Thank you for allowing pharmacy to be a part of this patient's care.  Lynelle Doctor 10/23/2017 9:19 AM

## 2017-10-23 NOTE — Progress Notes (Signed)
A consult was received from an ED physician for zosyn and vancomycin per pharmacy dosing.  The patient's profile has been reviewed for ht/wt/allergies/indication/available labs.   A one time order has been placed for zosyn 3.375 Gm and vancomycin 1250 mg.  Further antibiotics/pharmacy consults should be ordered by admitting physician if indicated.                       Thank you, Dorrene German 10/23/2017  4:13 AM

## 2017-10-23 NOTE — ED Notes (Addendum)
Patients son would like to be updated if patient gets moved to a room upstairs. [name] [phone]

## 2017-10-23 NOTE — H&P (Signed)
History and Physical    Jo Parker FYB:[id number] DOB: 1946-03-20 DOA: 10/22/2017  PCP: Patient, [name] Per   Patient coming from: home    Chief Complaint: fevers, chills  HPI: Jo Parker is a 72 y.o. 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  Patient reports that she was doing fairly well until last night when she began to have chills.  Her temperature orally was 100.9.  She did not have any nausea, vomiting, abdominal pain, chest pain, diarrhea.  She did have some shortness of breath associated with this chills and fever.  She has not had any rash.  ED Course: In the ED patient's vitals were notable for mild tachycardia and tachypnea with heart rate in 109.  Blood pressure was mildly elevated in the 160s.  Labs were notable for normal white blood cell count.  Hemoglobin was 8.9 hematocrit was 27.8.  Sodium was 132.  Chest x-ray showed mild to moderate left-sided pleural effusion.  CTA and CT abdomen showed only moderate ascites and moderate-sized left pleural effusion and small right pleural effusion.  Patient was given IV vancomycin and Zosyn after blood cultures were ordered.  She was given a sepsis bolus.  Review of Systems: As per HPI otherwise 10 point review of systems negative.   Past Medical History:  Diagnosis Date  . Anxiety and depression   . Aortic aneurysm ([place])   . Arthritis   . Carcinomatosis ([place]) 10/2017  . COPD (chronic obstructive pulmonary disease) ([place]) 10/23/2017  . History of diverticulitis   . HLD (hyperlipidemia) 10/23/2017  . Hypercholesterolemia   . Hypertension   . Mitral valve prolapse   . Uterine cancer ([place])    had partial hysterectomy at 27     Past Surgical History:  Procedure Laterality Date  . ABDOMINAL HYSTERECTOMY    . cardiac monitor implant    . CEREBRAL ANEURYSM REPAIR    . CERVICAL SPINE SURGERY     bone spurs  . IR FLUORO GUIDE PORT INSERTION RIGHT  10/14/2017  . IR PARACENTESIS  10/14/2017  . IR US GUIDE VASC ACCESS RIGHT  10/14/2017  . PARTIAL HYSTERECTOMY  1971   "precancer"     reports that she has been smoking.  She has a 55.00 pack-year smoking history. She has never used smokeless tobacco. She reports that she drinks alcohol. She reports that she does not use drugs.  No Known Allergies  Family History  Problem Relation Age of Onset  . Endometrial cancer Sister 61       endometrial ca  . Lung cancer Paternal Aunt   . Breast cancer Paternal Aunt   . Breast cancer Paternal Aunt   . Stomach cancer Maternal Grandfather   . Prostate cancer Paternal Uncle   . Breast cancer Cousin   . Breast cancer Cousin   . Lung cancer Father        lung ca    Prior to Admission medications   Medication Sig Start Date End Date Taking? Authorizing Provider  aspirin EC 81 MG tablet Take 81 mg by mouth daily.   Yes [provider]  atorvastatin (LIPITOR) 80 MG tablet Take 80 mg by mouth daily.   Yes [provider]  budesonide-formoterol (SYMBICORT) 160-4.5 MCG/ACT inhaler Inhale 2  puffs into the lungs 2 (two) times daily. Use two puffs by mouth twice daily(rinse mouth after use)   Yes [provider]  carvedilol (COREG) 12.5 MG tablet Take 12.5 mg by mouth 2 (two) times daily with a meal.   Yes [provider]  citalopram (CELEXA) 40 MG tablet Take 40 mg by mouth daily.   Yes [provider]  dexamethasone (DECADRON) 4 MG tablet Take 5 tabs the night before and 5 tabs the morning of chemotherapy, every 3 weeks with food 10/13/17  Yes Gorsuch, Ni, MD  HYDROcodone-acetaminophen (NORCO) 10-325 MG tablet Take 1 tablet by mouth every 3 (three) hours as needed. Patient taking  differently: Take 1 tablet by mouth every 3 (three) hours as needed for moderate pain or severe pain.  10/08/17  Yes Gorsuch, Ni, MD  lidocaine-prilocaine (EMLA) cream Apply to affected area once 10/13/17  Yes Gorsuch, Ni, MD  lubiprostone (AMITIZA) 8 MCG capsule Take 8 mcg by mouth 2 (two) times daily as needed for constipation.   Yes [provider]  meclizine (ANTIVERT) 25 MG tablet Take 25 mg by mouth 3 (three) times daily as needed for dizziness.   Yes [provider]  Melatonin 5 MG CAPS Take 2 capsules by mouth at bedtime as needed (sleep). Take 2 capsules at bedtime as needed   Yes [provider]  ondansetron (ZOFRAN) 4 MG tablet Take 4 mg by mouth every 6 (six) hours as needed for nausea or vomiting.   Yes [provider]  polyethylene glycol (MIRALAX / GLYCOLAX) packet Take 17 g by mouth daily as needed for mild constipation or moderate constipation.    Yes [provider]  prochlorperazine (COMPAZINE) 10 MG tablet Take 1 tablet (10 mg total) by mouth every 6 (six) hours as needed (Nausea or vomiting). 10/13/17  Yes [name], MD    Physical Exam: Vitals:   10/23/17 0717 10/23/17 0730 10/23/17 0745 10/23/17 0800  BP: (!) 163/93 (!) 159/91 (!) 174/92 (!) 167/93  Pulse: (!) 109 (!) 109 (!) 104 81  Resp: (!) 26 (!) 25 (!) 27 (!) 23  Temp:      TempSrc:      SpO2: 96% 97% 95% 96%  Weight:      Height:        Constitutional: NAD, calm, comfortable Vitals:   10/23/17 0717 10/23/17 0730 10/23/17 0745 10/23/17 0800  BP: (!) 163/93 (!) 159/91 (!) 174/92 (!) 167/93  Pulse: (!) 109 (!) 109 (!) 104 81  Resp: (!) 26 (!) 25 (!) 27 (!) 23  Temp:      TempSrc:      SpO2: 96% 97% 95% 96%  Weight:      Height:       Eyes: Anicteric sclera ENMT: Dry mucous membranes, good dentition.  Neck: normal, supple Respiratory: Diminished lung sounds at left base, no crackles, wheezes, rhonchi, no increased work of breathing Cardiovascular: Tachycardia, no  murmurs Abdomen: Soft, mildly distended, mildly tender to palpation, diminished bowel sounds, shifting dullness, no rebound or guarding Musculoskeletal: No lower extremity edema Skin: Port site in right upper extremity is clean dry intact Neurologic: Grossly intact, moving all extremities psychiatric: Normal judgment and insight. Alert and oriented x 3. Normal mood.   Labs on Admission: I have personally reviewed following labs and imaging studies  CBC: Recent Labs  Lab 10/23/17 0020  WBC 6.4  NEUTROABS 5.0  HGB 8.9*  HCT 27.8*  MCV 59.4*  PLT 357   Basic  Metabolic Panel: Recent Labs  Lab 10/23/17 0020  NA 132*  K 3.5  CL 98*  CO2 23  GLUCOSE 140*  BUN 17  CREATININE 0.58  CALCIUM 8.4*   GFR: Estimated Creatinine Clearance: 52.6 mL/min (by C-G formula based on SCr of 0.58 mg/dL). Liver Function Tests: Recent Labs  Lab 10/23/17 0020  AST 30  ALT 21  ALKPHOS 59  BILITOT 0.6  PROT 6.4*  ALBUMIN 2.5*   No results for input(s): LIPASE, AMYLASE in the last 168 hours. No results for input(s): AMMONIA in the last 168 hours. Coagulation Profile: Recent Labs  Lab 10/23/17 0020  INR 1.23   Cardiac Enzymes: No results for input(s): CKTOTAL, CKMB, CKMBINDEX, TROPONINI in the last 168 hours. BNP (last 3 results) No results for input(s): PROBNP in the last 8760 hours. HbA1C: No results for input(s): HGBA1C in the last 72 hours. CBG: No results for input(s): GLUCAP in the last 168 hours. Lipid Profile: No results for input(s): CHOL, HDL, LDLCALC, TRIG, CHOLHDL, LDLDIRECT in the last 72 hours. Thyroid Function Tests: No results for input(s): TSH, T4TOTAL, FREET4, T3FREE, THYROIDAB in the last 72 hours. Anemia Panel: No results for input(s): VITAMINB12, FOLATE, FERRITIN, TIBC, IRON, RETICCTPCT in the last 72 hours. Urine analysis:    Component Value Date/Time   COLORURINE YELLOW 10/23/2017 0004   APPEARANCEUR HAZY (A) 10/23/2017 0004   LABSPEC 1.020 10/23/2017  0004   PHURINE 6.0 10/23/2017 0004   GLUCOSEU NEGATIVE 10/23/2017 0004   HGBUR NEGATIVE 10/23/2017 0004   BILIRUBINUR NEGATIVE 10/23/2017 0004   KETONESUR 5 (A) 10/23/2017 0004   PROTEINUR NEGATIVE 10/23/2017 0004   NITRITE NEGATIVE 10/23/2017 0004   LEUKOCYTESUR TRACE (A) 10/23/2017 0004    Radiological Exams on Admission: Dg Chest 2 View  Result Date: 10/23/2017 CLINICAL DATA:  Fever to 100.9 with chills. EXAM: CHEST - 2 VIEW COMPARISON:  09/03/2017 chest CT FINDINGS: The left hemidiaphragm and left heart border are obscured by and adjacent small to moderate left pleural effusion. There is probable adjacent left basilar atelectasis secondary to the effusion. Port catheter tip terminates in the distal SVC. Loop monitoring device projects over the cardiac silhouette. There is mild aortic atherosclerosis without aneurysm. Degenerative changes are seen about both shoulders. No aggressive osseous lesions. IMPRESSION: Small to moderate left pleural effusion with atelectasis. Mild aortic atherosclerosis. No acute pneumonic consolidation. Electronically Signed   By: Ashley Royalty M.D.   On: 10/23/2017 01:01   Ct Angio Chest Pe W And/or Wo Contrast  Result Date: 10/23/2017 CLINICAL DATA:  Fever and chills. Undergoing chemotherapy for primary peritoneal carcinomatosis. EXAM: CT ANGIOGRAPHY CHEST CT ABDOMEN AND PELVIS WITH CONTRAST TECHNIQUE: Multidetector CT imaging of the chest was performed using the standard protocol during bolus administration of intravenous contrast. Multiplanar CT image reconstructions and MIPs were obtained to evaluate the vascular anatomy. Multidetector CT imaging of the abdomen and pelvis was performed using the standard protocol during bolus administration of intravenous contrast. CONTRAST:  165mL ISOVUE-370 IOPAMIDOL (ISOVUE-370) INJECTION 76% COMPARISON:  Chest CT 09/03/2017 FINDINGS: CTA CHEST FINDINGS Cardiovascular: Right IJ central venous catheter with tip over the cavoatrial  junction. Metallic device over the left anterior chest wall. Mild cardiomegaly. Mild calcified plaque over the left main coronary artery and right coronary arteries. Mild calcified plaque over the thoracic aorta. Thoracic aorta is normal in caliber without aneurysm or dissection. Pulmonary arterial system is well opacified without evidence of emboli. Mediastinum/Nodes: No significant hilar or mediastinal adenopathy. Remaining mediastinal structures are unremarkable. Subcentimeter  right pericardiophrenic lymph node. Lungs/Pleura: There is minimal paraseptal emphysema over the upper lungs. There is a new moderate size left pleural effusion and new small to moderate right pleural effusion with associated compressive atelectasis in the lung bases left greater than right. Airways are within normal. Musculoskeletal: Minimal degenerate change of the spine. Review of the MIP images confirms the above findings. CT ABDOMEN and PELVIS FINDINGS Hepatobiliary: Liver and biliary tree are within normal. Gallbladder unremarkable. Pancreas: Normal. Spleen: Normal. Adrenals/Urinary Tract: Adrenal glands are normal. Kidneys are normal in size without hydronephrosis or nephrolithiasis. There is a 2.1 cm cyst over the left lower pole. Bladder is unremarkable. Stomach/Bowel: Stomach and small bowel are unremarkable. Appendix is not well visualized. Colon is normal. Vascular/Lymphatic: Aneurysmal dilatation of the suprarenal abdominal aorta measuring 3.9 cm in AP diameter with appearance suggesting this is a pseudoaneurysm. Mild to moderate calcified plaque throughout the abdominal aorta and iliac arteries. Few small gastrohepatic and periaortic lymph nodes are present. Reproductive: Previous hysterectomy. Adnexal regions are unremarkable. Other: Moderate stable ascites. Extensive known anterior peritoneal carcinomatosis over the anterior mid to lower peritoneal cavity and pelvis. Musculoskeletal: Degenerate change of the spine and hips.  Review of the MIP images confirms the above findings. IMPRESSION: No evidence of pulmonary embolism. New moderate size left effusion and new small to moderate right effusion with associated bibasilar atelectasis. Known extensive peritoneal carcinomatosis and moderate ascites. 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 Recommend correlation with prior exams and vascular surgery consultation. 2.1 cm left renal cyst. Mild cardiomegaly and minimal atherosclerotic coronary artery disease. Aortic Atherosclerosis (ICD10-I70.0) and Emphysema (ICD10-J43.9). Electronically Signed   By: Marin Olp M.D.   On: 10/23/2017 07:46   Ct Abdomen Pelvis W Contrast  Result Date: 10/23/2017 CLINICAL DATA:  Fever and chills. Undergoing chemotherapy for primary peritoneal carcinomatosis. EXAM: CT ANGIOGRAPHY CHEST CT ABDOMEN AND PELVIS WITH CONTRAST TECHNIQUE: Multidetector CT imaging of the chest was performed using the standard protocol during bolus administration of intravenous contrast. Multiplanar CT image reconstructions and MIPs were obtained to evaluate the vascular anatomy. Multidetector CT imaging of the abdomen and pelvis was performed using the standard protocol during bolus administration of intravenous contrast. CONTRAST:  161mL ISOVUE-370 IOPAMIDOL (ISOVUE-370) INJECTION 76% COMPARISON:  Chest CT 09/03/2017 FINDINGS: CTA CHEST FINDINGS Cardiovascular: Right IJ central venous catheter with tip over the cavoatrial junction. Metallic device over the left anterior chest wall. Mild cardiomegaly. Mild calcified plaque over the left main coronary artery and right coronary arteries. Mild calcified plaque over the thoracic aorta. Thoracic aorta is normal in caliber without aneurysm or dissection. Pulmonary arterial system is well opacified without evidence of emboli. Mediastinum/Nodes: No significant hilar or mediastinal adenopathy. Remaining mediastinal structures are unremarkable. Subcentimeter right  pericardiophrenic lymph node. Lungs/Pleura: There is minimal paraseptal emphysema over the upper lungs. There is a new moderate size left pleural effusion and new small to moderate right pleural effusion with associated compressive atelectasis in the lung bases left greater than right. Airways are within normal. Musculoskeletal: Minimal degenerate change of the spine. Review of the MIP images confirms the above findings. CT ABDOMEN and PELVIS FINDINGS Hepatobiliary: Liver and biliary tree are within normal. Gallbladder unremarkable. Pancreas: Normal. Spleen: Normal. Adrenals/Urinary Tract: Adrenal glands are normal. Kidneys are normal in size without hydronephrosis or nephrolithiasis. There is a 2.1 cm cyst over the left lower pole. Bladder is unremarkable. Stomach/Bowel: Stomach and small bowel are unremarkable. Appendix is not well visualized. Colon is normal. Vascular/Lymphatic: Aneurysmal dilatation of the  suprarenal abdominal aorta measuring 3.9 cm in AP diameter with appearance suggesting this is a pseudoaneurysm. Mild to moderate calcified plaque throughout the abdominal aorta and iliac arteries. Few small gastrohepatic and periaortic lymph nodes are present. Reproductive: Previous hysterectomy. Adnexal regions are unremarkable. Other: Moderate stable ascites. Extensive known anterior peritoneal carcinomatosis over the anterior mid to lower peritoneal cavity and pelvis. Musculoskeletal: Degenerate change of the spine and hips. Review of the MIP images confirms the above findings. IMPRESSION: No evidence of pulmonary embolism. New moderate size left effusion and new small to moderate right effusion with associated bibasilar atelectasis. Known extensive peritoneal carcinomatosis and moderate ascites. 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 Recommend correlation with prior exams and vascular surgery consultation. 2.1 cm left renal cyst. Mild cardiomegaly and minimal atherosclerotic coronary  artery disease. Aortic Atherosclerosis (ICD10-I70.0) and Emphysema (ICD10-J43.9). Electronically Signed   By: Marin Olp M.D.   On: 10/23/2017 07:46    EKG: Independently reviewed.  Sinus tachycardia, no acute ST segment changes, no prior to compare  Assessment/Plan Principal Problem:   Sepsis ([place]) Active Problems:   Primary peritoneal carcinomatosis ([name])   Malignant ascites   Deficiency anemia   Carcinomatosis ([name])   COPD (chronic obstructive pulmonary disease) ([name])   HLD (hyperlipidemia)    #) Sepsis: Patient presents with sepsis in the setting of tachycardia and tachypnea.  She is at high risk as a recent chemotherapy just completed.  Unfortunately the source is not exactly clear.  She does have some shortness of breath and this left-sided pleural effusion could be associated with a pneumonia or infection.  Tentatively could be associated simply with increased vascular permeability or possibly hepatic hydrothorax from her malignant ascites.  Additionally her malignant ascites could be infected however it is fairly unlikely considering how exudative it is likely to be. -Continue IV vancomycin and cefepime -Follow-up blood cultures ordered 10/23/2017 - We will order thoracentesis and paracentesis  #) Hypertension/hyperlipidemia: -Continue carvedilol 12.5 mg twice daily -Continue atorvastatin 80 mg daily -Continue aspirin 81 mg daily  #) COPD: -Continue LABA/ICS  #) Peritoneal carcinomatosis: -Notified oncologist via epic  #) Pain/psych: -Continue citalopram 40 mg daily -Continue PRN Norco -Continue lubiprostone 8 mcg twice daily  Fluids: Tolerating p.o. Electrolytes: Monitor and supplement Nutrition: Regular diet  Prophylaxis: Enoxaparin  Disposition: Pending resolution of sepsis  Full code   Cristy Folks MD [facility]   If 7PM-7AM, please contact night-coverage [email or web address] Password [facility]  10/23/2017, 8:33 AM

## 2017-10-24 ENCOUNTER — Inpatient Hospital Stay (HOSPITAL_COMMUNITY): Payer: Medicare PPO

## 2017-10-24 DIAGNOSIS — R188 Other ascites: Secondary | ICD-10-CM

## 2017-10-24 DIAGNOSIS — J449 Chronic obstructive pulmonary disease, unspecified: Secondary | ICD-10-CM

## 2017-10-24 DIAGNOSIS — D539 Nutritional anemia, unspecified: Secondary | ICD-10-CM

## 2017-10-24 DIAGNOSIS — R0600 Dyspnea, unspecified: Secondary | ICD-10-CM

## 2017-10-24 DIAGNOSIS — C482 Malignant neoplasm of peritoneum, unspecified: Secondary | ICD-10-CM

## 2017-10-24 DIAGNOSIS — E785 Hyperlipidemia, unspecified: Secondary | ICD-10-CM

## 2017-10-24 DIAGNOSIS — C800 Disseminated malignant neoplasm, unspecified: Secondary | ICD-10-CM

## 2017-10-24 LAB — GRAM STAIN

## 2017-10-24 LAB — PROTEIN, PLEURAL OR PERITONEAL FLUID: Total protein, fluid: 3.3 g/dL

## 2017-10-24 LAB — CBC
HCT: 23 % — ABNORMAL LOW (ref 36.0–46.0)
Hemoglobin: 7.3 g/dL — ABNORMAL LOW (ref 12.0–15.0)
MCH: 18.9 pg — ABNORMAL LOW (ref 26.0–34.0)
MCHC: 31.7 g/dL (ref 30.0–36.0)
MCV: 59.4 fL — ABNORMAL LOW (ref 78.0–100.0)
Platelets: 244 K/uL (ref 150–400)
RBC: 3.87 MIL/uL (ref 3.87–5.11)
RDW: 17.2 % — ABNORMAL HIGH (ref 11.5–15.5)
WBC: 3.3 10*3/uL — ABNORMAL LOW (ref 4.0–10.5)

## 2017-10-24 LAB — BASIC METABOLIC PANEL WITH GFR
Anion gap: 11 (ref 5–15)
BUN: 8 mg/dL (ref 6–20)
Calcium: 8 mg/dL — ABNORMAL LOW (ref 8.9–10.3)
Creatinine, Ser: 0.41 mg/dL — ABNORMAL LOW (ref 0.44–1.00)
GFR calc non Af Amer: >60 mL/min (ref >60)
Glucose, Bld: 122 mg/dL — ABNORMAL HIGH (ref 65–99)

## 2017-10-24 LAB — BASIC METABOLIC PANEL
CO2: 21 mmol/L — ABNORMAL LOW (ref 22–32)
Chloride: 100 mmol/L — ABNORMAL LOW (ref 101–111)
GFR calc Af Amer: >60 mL/min (ref >60)
Potassium: 3.2 mmol/L — ABNORMAL LOW (ref 3.5–5.1)
Sodium: 132 mmol/L — ABNORMAL LOW (ref 135–145)

## 2017-10-24 LAB — URINE CULTURE

## 2017-10-24 LAB — LACTATE DEHYDROGENASE, PLEURAL OR PERITONEAL FLUID: LD FL: 249 U/L — AB (ref 3–23)

## 2017-10-24 LAB — LACTATE DEHYDROGENASE: LDH: 240 U/L — AB (ref 98–192)

## 2017-10-24 MED ORDER — SODIUM CHLORIDE 0.9% FLUSH
10.0000 mL | INTRAVENOUS | Status: DC | PRN
  Administered 2017-10-26: 20 mL
  Administered 2017-10-27: 10 mL
  Filled 2017-10-24 (×2): qty 40

## 2017-10-24 MED ORDER — LIDOCAINE HCL 1 % IJ SOLN
INTRAMUSCULAR | Status: AC
  Filled 2017-10-24: qty 10

## 2017-10-24 NOTE — Progress Notes (Signed)
PT Cancellation Note  Patient Details Name: Jo Parker MRN: [id number] DOB: December 30, 1945   Cancelled Treatment:    Reason Eval/Treat Not Completed: Patient at procedure or test/unavailable(pt out of room. Will follow. )   Philomena Doheny 10/24/2017, 9:07 AM [phone]

## 2017-10-24 NOTE — Evaluation (Addendum)
Physical Therapy Evaluation Patient Details Name: Jo Parker MRN: [id number] DOB: 1946/03/21 Today's Date: 10/24/2017   History of Present Illness  72 y.o. 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 Dx of sepsis.  Clinical Impression  Pt admitted with above diagnosis. Pt currently with functional limitations due to the deficits listed below (see PT Problem List). Mod assist supine to sit, min assist to pivot to recliner. Pt unable to ambulate 2* abdominal/back pain and fatigue.  Pt will benefit from skilled PT to increase their independence and safety with mobility to allow discharge to the venue listed below.       Follow Up Recommendations Home health PT; assist for mobility    Equipment Recommendations  Rollator (4 wheeled RW  With seat)    Recommendations for Other Services       Precautions / Restrictions Precautions Precautions: Fall Precaution Comments: denies falls in past 1 year Restrictions Weight Bearing Restrictions: No      Mobility  Bed Mobility Overal bed mobility: Needs Assistance Bed Mobility: Supine to Sit     Supine to sit: Mod assist     General bed mobility comments: mod A to raise trunk, increased time 2* pain  Transfers Overall transfer level: Needs assistance Equipment used: 1 person hand held assist Transfers: Sit to/from Stand;Stand Pivot Transfers Sit to Stand: Min assist Stand pivot transfers: Min assist       General transfer comment: min A to rise and to pivot  Ambulation/Gait             General Gait Details: deferred 2* pain/fatigue with transfer  Stairs            Wheelchair Mobility    Modified Rankin (Stroke  Patients Only)       Balance Overall balance assessment: Needs assistance   Sitting balance-Leahy Scale: Good     Standing balance support: Single extremity supported Standing balance-Leahy Scale: Poor Standing balance comment: requires single UE support                             Pertinent Vitals/Pain Pain Assessment: 0-10 Pain Score: 6  Pain Location: abdomen and back at thoracentesis site Pain Descriptors / Indicators: Sore Pain Intervention(s): Limited activity within patient's tolerance;Monitored during session;Patient requesting pain meds-RN notified    Home Living Family/patient expects to be discharged to:: Private residence Living Arrangements: Spouse/significant other Available Help at Discharge: Family   Home Access: Level entry     Home Layout: Two level;Bed/bath upstairs Home Equipment: Kasandra Knudsen - single point Additional Comments: pt/spouse live in [place], pt staying with son while doing chemo here in [place]    Prior Function Level of Independence: Independent         Comments: independent walking short distances, ADLs independent but slow and labored     Hand Dominance        Extremity/Trunk Assessment   Upper Extremity Assessment Upper Extremity Assessment: Generalized weakness    Lower Extremity Assessment Lower Extremity Assessment: Generalized weakness(knee ext +4/5)    Cervical / Trunk Assessment Cervical / Trunk Assessment: Normal  Communication   Communication: No difficulties  Cognition Arousal/Alertness: Awake/alert Behavior During Therapy: WFL for tasks assessed/performed Overall Cognitive Status:  Within Functional Limits for tasks assessed                                        General Comments      Exercises     Assessment/Plan    PT Assessment Patient needs continued PT services  PT Problem List Decreased balance;Decreased activity tolerance;Pain       PT Treatment Interventions  DME instruction;Gait training;Functional mobility training;Stair training;Therapeutic activities;Therapeutic exercise;Patient/family education    PT Goals (Current goals can be found in the Care Plan section)  Acute Rehab PT Goals Patient Stated Goal: "I've been pretty limited for awhile" -when asked what activities she enjoys.  PT Goal Formulation: With patient/family Time For Goal Achievement: 11/07/17 Potential to Achieve Goals: Fair    Frequency Min 3X/week   Barriers to discharge        Co-evaluation               AM-PAC PT "6 Clicks" Daily Activity  Outcome Measure Difficulty turning over in bed (including adjusting bedclothes, sheets and blankets)?: Unable Difficulty moving from lying on back to sitting on the side of the bed? : Unable Difficulty sitting down on and standing up from a chair with arms (e.g., wheelchair, bedside commode, etc,.)?: Unable Help needed moving to and from a bed to chair (including a wheelchair)?: A Lot Help needed walking in hospital room?: A Lot Help needed climbing 3-5 steps with a railing? : Total 6 Click Score: 8    End of Session   Activity Tolerance: Patient limited by fatigue;Patient limited by pain Patient left: in chair;with call bell/phone within reach;with nursing/sitter in room Nurse Communication: Mobility status;Patient requests pain meds PT Visit Diagnosis: Difficulty in walking, not elsewhere classified (R26.2);Muscle weakness (generalized) (M62.81)    Time: 7062-3762 PT Time Calculation (min) (ACUTE ONLY): 21 min   Charges:   PT Evaluation $PT Eval Low Complexity: 1 Low     PT G Codes:         Philomena Doheny 10/24/2017, 1:04 PM [phone]

## 2017-10-24 NOTE — Procedures (Signed)
Ultrasound-guided diagnostic and therapeutic left thoracentesis performed yielding 580 cc of hazy, blood-tinged colored fluid. No immediate complications. Follow-up chest x-ray pending. The fluid was sent to the lab for preordered studies.

## 2017-10-24 NOTE — Progress Notes (Addendum)
PROGRESS NOTE    Jo Parker  IOX:[id number] DOB: 29-Jul-1945 DOA: 10/22/2017 PCP: Patient, [name] Per   Brief Narrative:  HPI On 10/23/2017 by Dr. Brigid Re Purohit  Jo Parker is a 72 y.o. 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  Patient reports that she was doing fairly well until last night when she began to have chills.  Her temperature orally was 100.9.  She did not have any nausea, vomiting, abdominal pain, chest pain, diarrhea.  She did have some shortness of breath associated with this chills and fever.  She has not had any rash.  Assessment & Plan   Dyspnea secondary to pleural effusions with tachypnea and tachycardia -Sepsis ruled out as of now.  Patient did present with tachycardia and tachypnea however feel that this is likely due to her pleural effusions -Chest x-ray as well as chest CT chest unremarkable for infection -UA unremarkable for infection, urine culture showed multiple species -Blood Cultures pending -CTA chest was negative for PE.  Did show moderate sized left pleural effusion, small to moderate right effusion. -Interventional radiology consulted, status post ultrasound-guided left thoracentesis yielding 580 cc of hazy blood-tinged colored fluid. Labs pending -Patient feels her breathing has improved. -Vital signs improving -She was empirically started on vancomycin and cefepime, will discontinue this and monitor patient.   Peritoneal carcinomatosis with ascites -CTA abdomen pelvis showed known extensive peritoneal carcinomatosis and moderate ascites. -Paracentesis pending -Patient follows with Dr. Alvy Bimler, oncology. 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 -She may need to follow-up with vascular surgery as an outpatient.  Depression -Continue Celexa  DVT Prophylaxis Lovenox  Code Status: Full  Family Communication: Family at bedside  Disposition Plan: Admitted.  Pending paracentesis and improvement in patient's respiratory status as well as pain.  Consultants Interventional radiology Oncology  Procedures  Ultrasound-guided left thoracentesis  Antibiotics   Anti-infectives (From admission, onward)   Start     Dose/Rate Route Frequency Ordered Stop   10/24/17 0400  vancomycin (VANCOCIN) IVPB 750 mg/150 ml premix  Status:  Discontinued     750 mg 150 mL/hr over 60 Minutes Intravenous Every 24 hours 10/23/17 0926 10/23/17 0927   10/24/17 0400  vancomycin (VANCOCIN) IVPB 1000 mg/200 mL premix     1,000 mg 200 mL/hr over 60 Minutes Intravenous Every 24 hours 10/23/17 0927     10/23/17 1000  ceFEPIme (MAXIPIME) 1 g in sodium chloride 0.9 % 100 mL IVPB     1 g 200 mL/hr over 30 Minutes Intravenous Every 8 hours 10/23/17 0931     10/23/17 0945  ceFEPIme (MAXIPIME) 1 g in sodium chloride 0.9 % 100 mL IVPB  Status:  Discontinued     1 g 200 mL/hr over 30 Minutes Intravenous Every 8 hours 10/23/17 0931 10/23/17 0931   10/23/17 0415  piperacillin-tazobactam (ZOSYN) IVPB 3.375 g     3.375 g 100 mL/hr over 30 Minutes Intravenous  Once 10/23/17 0408 10/23/17 0503   10/23/17 0415  vancomycin (VANCOCIN) IVPB 1000 mg/200 mL premix  Status:  Discontinued  1,000 mg 200 mL/hr over 60 Minutes Intravenous  Once 10/23/17 0408 10/23/17 0412   10/23/17 0415  vancomycin (VANCOCIN) 1,250 mg in sodium chloride 0.9 % 250 mL IVPB     1,250 mg 166.7 mL/hr over 90 Minutes  Intravenous  Once 10/23/17 0412 10/23/17 0627      Subjective:   Lyn Joens seen and examined today.  Patient states she had her thoracentesis this morning.  Feels her breathing is improved.  Currently denies chest pain, nausea, vomiting, diarrhea or constipation, dizziness or headache.  Does feel that her abdomen feels full.  Objective:   Vitals:   10/24/17 0936 10/24/17 0938 10/24/17 0941 10/24/17 1014  BP: (!) 89/51 (!) 95/56 (!) 92/55   Pulse:      Resp:      Temp:      TempSrc:      SpO2: 92% 93% 94% 94%  Weight:      Height:        Intake/Output Summary (Last 24 hours) at 10/24/2017 1132 Last data filed at 10/23/2017 2300 Gross per 24 hour  Intake 240 ml  Output -  Net 240 ml   Filed Weights   10/23/17 0006  Weight: 52.6 kg (116 lb)    Exam  General: Well developed, thin, elderly, no apparent distress  HEENT: NCAT, mucous membranes moist.   Neck: Supple  Cardiovascular: S1 S2 auscultated, no rubs, murmurs or gallops. Regular rate and rhythm.  Respiratory: Clear to auscultation bilaterally with equal chest rise  Abdomen: Soft, RUQ TTP, mildly distended, + bowel sounds  Extremities: warm dry without cyanosis clubbing or edema  Neuro: AAOx3, nonfocal   Skin: Without rashes exudates or nodules  Psych: Appropriate mood and affect, pleasant   Data Reviewed: I have personally reviewed following labs and imaging studies  CBC: Recent Labs  Lab 10/23/17 0020 10/24/17 0543  WBC 6.4 3.3*  NEUTROABS 5.0  --   HGB 8.9* 7.3*  HCT 27.8* 23.0*  MCV 59.4* 59.4*  PLT 357 387   Basic Metabolic Panel: Recent Labs  Lab 10/23/17 0020 10/24/17 0543  NA 132* 132*  K 3.5 3.2*  CL 98* 100*  CO2 23 21*  GLUCOSE 140* 122*  BUN 17 8  CREATININE 0.58 0.41*  CALCIUM 8.4* 8.0*   GFR: Estimated Creatinine Clearance: 52.6 mL/min (A) (by C-G formula based on SCr of 0.41 mg/dL (L)). Liver Function Tests: Recent Labs  Lab 10/23/17 0020  AST 30  ALT 21   ALKPHOS 59  BILITOT 0.6  PROT 6.4*  ALBUMIN 2.5*   No results for input(s): LIPASE, AMYLASE in the last 168 hours. No results for input(s): AMMONIA in the last 168 hours. Coagulation Profile: Recent Labs  Lab 10/23/17 0020  INR 1.23   Cardiac Enzymes: No results for input(s): CKTOTAL, CKMB, CKMBINDEX, TROPONINI in the last 168 hours. BNP (last 3 results) No results for input(s): PROBNP in the last 8760 hours. HbA1C: No results for input(s): HGBA1C in the last 72 hours. CBG: No results for input(s): GLUCAP in the last 168 hours. Lipid Profile: No results for input(s): CHOL, HDL, LDLCALC, TRIG, CHOLHDL, LDLDIRECT in the last 72 hours. Thyroid Function Tests: No results for input(s): TSH, T4TOTAL, FREET4, T3FREE, THYROIDAB in the last 72 hours. Anemia Panel: No results for input(s): VITAMINB12, FOLATE, FERRITIN, TIBC, IRON, RETICCTPCT in the last 72 hours. Urine analysis:    Component Value Date/Time   COLORURINE YELLOW 10/23/2017 0004   APPEARANCEUR HAZY (A) 10/23/2017 0004   LABSPEC  1.020 10/23/2017 0004   PHURINE 6.0 10/23/2017 0004   GLUCOSEU NEGATIVE 10/23/2017 0004   HGBUR NEGATIVE 10/23/2017 0004   BILIRUBINUR NEGATIVE 10/23/2017 0004   KETONESUR 5 (A) 10/23/2017 0004   PROTEINUR NEGATIVE 10/23/2017 0004   NITRITE NEGATIVE 10/23/2017 0004   LEUKOCYTESUR TRACE (A) 10/23/2017 0004   Sepsis Labs: @LABRCNTIP (procalcitonin:4,lacticidven:4)  ) Recent Results (from the past 240 hour(s))  Urine culture     Status: Abnormal   Collection Time: 10/23/17  4:18 AM  Result Value Ref Range Status   Specimen Description   Final    URINE, RANDOM Performed at [facility], [place] [address] [place], [place] [address]    Special Requests   Final    NONE Performed at [facility], [place] [address], [place], [place] [address]    Culture MULTIPLE SPECIES PRESENT, SUGGEST RECOLLECTION (A)  Final   Report Status 10/24/2017 FINAL  Final       Radiology Studies: Dg Chest 1 View  Result Date: 10/24/2017 CLINICAL DATA:  Post left thoracentesis.  Peritoneal carcinomatosis. EXAM: CHEST  1 VIEW COMPARISON:  10/23/2017 FINDINGS: Right IJ Port-A-Cath unchanged with tip over the SVC. Metallic device over the left chest unchanged. Lungs are adequately inflated with significant improvement in the left-sided pleural effusion post thoracentesis. No evidence pneumothorax. There is a small residual amount of left pleural fluid likely with associated basilar atelectasis. Remainder of the exam is unchanged. IMPRESSION: Improved left pleural effusion post thoracentesis. Small amount of residual left pleural fluid/basilar atelectasis. No pneumothorax. Electronically Signed   By: Marin Olp M.D.   On: 10/24/2017 10:16   Dg Chest 2 View  Result Date: 10/23/2017 CLINICAL DATA:  Fever to 100.9 with chills. EXAM: CHEST - 2 VIEW COMPARISON:  09/03/2017 chest CT FINDINGS: The left hemidiaphragm and left heart border are obscured by and adjacent small to moderate left pleural effusion. There is probable adjacent left basilar atelectasis secondary to the effusion. Port catheter tip terminates in the distal SVC. Loop monitoring device projects over the cardiac silhouette. There is mild aortic atherosclerosis without aneurysm. Degenerative changes are seen about both shoulders. No aggressive osseous lesions. IMPRESSION: Small to moderate left pleural effusion with atelectasis. Mild aortic atherosclerosis. No acute pneumonic consolidation. Electronically Signed   By: Ashley Royalty M.D.   On: 10/23/2017 01:01   Ct Angio Chest Pe W And/or Wo Contrast  Result Date: 10/23/2017 CLINICAL DATA:  Fever and chills. Undergoing chemotherapy for primary peritoneal carcinomatosis. EXAM: CT ANGIOGRAPHY CHEST CT ABDOMEN AND PELVIS WITH CONTRAST TECHNIQUE: Multidetector CT imaging of the chest was performed using the standard protocol during bolus administration of intravenous  contrast. Multiplanar CT image reconstructions and MIPs were obtained to evaluate the vascular anatomy. Multidetector CT imaging of the abdomen and pelvis was performed using the standard protocol during bolus administration of intravenous contrast. CONTRAST:  155mL ISOVUE-370 IOPAMIDOL (ISOVUE-370) INJECTION 76% COMPARISON:  Chest CT 09/03/2017 FINDINGS: CTA CHEST FINDINGS Cardiovascular: Right IJ central venous catheter with tip over the cavoatrial junction. Metallic device over the left anterior chest wall. Mild cardiomegaly. Mild calcified plaque over the left main coronary artery and right coronary arteries. Mild calcified plaque over the thoracic aorta. Thoracic aorta is normal in caliber without aneurysm or dissection. Pulmonary arterial system is well opacified without evidence of emboli. Mediastinum/Nodes: No significant hilar or mediastinal adenopathy. Remaining mediastinal structures are unremarkable. Subcentimeter right pericardiophrenic lymph node. Lungs/Pleura: There is minimal paraseptal emphysema over the upper lungs. There is a new  moderate size left pleural effusion and new small to moderate right pleural effusion with associated compressive atelectasis in the lung bases left greater than right. Airways are within normal. Musculoskeletal: Minimal degenerate change of the spine. Review of the MIP images confirms the above findings. CT ABDOMEN and PELVIS FINDINGS Hepatobiliary: Liver and biliary tree are within normal. Gallbladder unremarkable. Pancreas: Normal. Spleen: Normal. Adrenals/Urinary Tract: Adrenal glands are normal. Kidneys are normal in size without hydronephrosis or nephrolithiasis. There is a 2.1 cm cyst over the left lower pole. Bladder is unremarkable. Stomach/Bowel: Stomach and small bowel are unremarkable. Appendix is not well visualized. Colon is normal. Vascular/Lymphatic: Aneurysmal dilatation of the suprarenal abdominal aorta measuring 3.9 cm in AP diameter with appearance  suggesting this is a pseudoaneurysm. Mild to moderate calcified plaque throughout the abdominal aorta and iliac arteries. Few small gastrohepatic and periaortic lymph nodes are present. Reproductive: Previous hysterectomy. Adnexal regions are unremarkable. Other: Moderate stable ascites. Extensive known anterior peritoneal carcinomatosis over the anterior mid to lower peritoneal cavity and pelvis. Musculoskeletal: Degenerate change of the spine and hips. Review of the MIP images confirms the above findings. IMPRESSION: No evidence of pulmonary embolism. New moderate size left effusion and new small to moderate right effusion with associated bibasilar atelectasis. Known extensive peritoneal carcinomatosis and moderate ascites. 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 Recommend correlation with prior exams and vascular surgery consultation. 2.1 cm left renal cyst. Mild cardiomegaly and minimal atherosclerotic coronary artery disease. Aortic Atherosclerosis (ICD10-I70.0) and Emphysema (ICD10-J43.9). Electronically Signed   By: Marin Olp M.D.   On: 10/23/2017 07:46   Ct Abdomen Pelvis W Contrast  Result Date: 10/23/2017 CLINICAL DATA:  Fever and chills. Undergoing chemotherapy for primary peritoneal carcinomatosis. EXAM: CT ANGIOGRAPHY CHEST CT ABDOMEN AND PELVIS WITH CONTRAST TECHNIQUE: Multidetector CT imaging of the chest was performed using the standard protocol during bolus administration of intravenous contrast. Multiplanar CT image reconstructions and MIPs were obtained to evaluate the vascular anatomy. Multidetector CT imaging of the abdomen and pelvis was performed using the standard protocol during bolus administration of intravenous contrast. CONTRAST:  18mL ISOVUE-370 IOPAMIDOL (ISOVUE-370) INJECTION 76% COMPARISON:  Chest CT 09/03/2017 FINDINGS: CTA CHEST FINDINGS Cardiovascular: Right IJ central venous catheter with tip over the cavoatrial junction. Metallic device over the left  anterior chest wall. Mild cardiomegaly. Mild calcified plaque over the left main coronary artery and right coronary arteries. Mild calcified plaque over the thoracic aorta. Thoracic aorta is normal in caliber without aneurysm or dissection. Pulmonary arterial system is well opacified without evidence of emboli. Mediastinum/Nodes: No significant hilar or mediastinal adenopathy. Remaining mediastinal structures are unremarkable. Subcentimeter right pericardiophrenic lymph node. Lungs/Pleura: There is minimal paraseptal emphysema over the upper lungs. There is a new moderate size left pleural effusion and new small to moderate right pleural effusion with associated compressive atelectasis in the lung bases left greater than right. Airways are within normal. Musculoskeletal: Minimal degenerate change of the spine. Review of the MIP images confirms the above findings. CT ABDOMEN and PELVIS FINDINGS Hepatobiliary: Liver and biliary tree are within normal. Gallbladder unremarkable. Pancreas: Normal. Spleen: Normal. Adrenals/Urinary Tract: Adrenal glands are normal. Kidneys are normal in size without hydronephrosis or nephrolithiasis. There is a 2.1 cm cyst over the left lower pole. Bladder is unremarkable. Stomach/Bowel: Stomach and small bowel are unremarkable. Appendix is not well visualized. Colon is normal. Vascular/Lymphatic: Aneurysmal dilatation of the suprarenal abdominal aorta measuring 3.9 cm in AP diameter with appearance suggesting this is a pseudoaneurysm. Mild to  moderate calcified plaque throughout the abdominal aorta and iliac arteries. Few small gastrohepatic and periaortic lymph nodes are present. Reproductive: Previous hysterectomy. Adnexal regions are unremarkable. Other: Moderate stable ascites. Extensive known anterior peritoneal carcinomatosis over the anterior mid to lower peritoneal cavity and pelvis. Musculoskeletal: Degenerate change of the spine and hips. Review of the MIP images confirms the  above findings. IMPRESSION: No evidence of pulmonary embolism. New moderate size left effusion and new small to moderate right effusion with associated bibasilar atelectasis. Known extensive peritoneal carcinomatosis and moderate ascites. 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 Recommend correlation with prior exams and vascular surgery consultation. 2.1 cm left renal cyst. Mild cardiomegaly and minimal atherosclerotic coronary artery disease. Aortic Atherosclerosis (ICD10-I70.0) and Emphysema (ICD10-J43.9). Electronically Signed   By: Marin Olp M.D.   On: 10/23/2017 07:46     Scheduled Meds: . aspirin EC  81 mg Oral Daily  . atorvastatin  80 mg Oral Daily  . carvedilol  12.5 mg Oral BID WC  . citalopram  40 mg Oral Daily  . enoxaparin (LOVENOX) injection  40 mg Subcutaneous Q24H  . feeding supplement (ENSURE ENLIVE)  237 mL Oral BID BM  . lidocaine      . lubiprostone  8 mcg Oral BID WC  . mometasone-formoterol  2 puff Inhalation BID   Continuous Infusions: . ceFEPime (MAXIPIME) IV 1 g (10/24/17 1033)  . vancomycin 1,000 mg (10/24/17 0511)     LOS: 1 day   Time Spent in minutes   45 minutes (greater than 50% of time spent with patient and family face to face, answering all questions, as well as reviewing chart and images, and formulating a plan)  Cristal Ford D.O. on 10/24/2017 at 11:32 AM  Between 7am to 7pm - Pager - [phone]  After 7pm go to [email or web address] - password [email or web address]  And look for the night coverage person covering for me after hours  [facility] Office  [phone]

## 2017-10-25 ENCOUNTER — Encounter: Payer: Self-pay | Admitting: Oncology

## 2017-10-25 ENCOUNTER — Inpatient Hospital Stay (HOSPITAL_COMMUNITY): Payer: Medicare PPO

## 2017-10-25 ENCOUNTER — Inpatient Hospital Stay: Payer: Medicare PPO

## 2017-10-25 ENCOUNTER — Telehealth: Payer: Self-pay | Admitting: Oncology

## 2017-10-25 ENCOUNTER — Inpatient Hospital Stay: Payer: Medicare PPO | Admitting: Hematology and Oncology

## 2017-10-25 DIAGNOSIS — R180 Malignant ascites: Secondary | ICD-10-CM

## 2017-10-25 DIAGNOSIS — Z6820 Body mass index (BMI) 20.0-20.9, adult: Secondary | ICD-10-CM

## 2017-10-25 DIAGNOSIS — E538 Deficiency of other specified B group vitamins: Secondary | ICD-10-CM

## 2017-10-25 DIAGNOSIS — E440 Moderate protein-calorie malnutrition: Secondary | ICD-10-CM

## 2017-10-25 DIAGNOSIS — C786 Secondary malignant neoplasm of retroperitoneum and peritoneum: Principal | ICD-10-CM

## 2017-10-25 DIAGNOSIS — R531 Weakness: Secondary | ICD-10-CM

## 2017-10-25 DIAGNOSIS — D61818 Other pancytopenia: Secondary | ICD-10-CM

## 2017-10-25 DIAGNOSIS — A419 Sepsis, unspecified organism: Secondary | ICD-10-CM

## 2017-10-25 DIAGNOSIS — J90 Pleural effusion, not elsewhere classified: Secondary | ICD-10-CM

## 2017-10-25 DIAGNOSIS — R64 Cachexia: Secondary | ICD-10-CM

## 2017-10-25 DIAGNOSIS — D518 Other vitamin B12 deficiency anemias: Secondary | ICD-10-CM

## 2017-10-25 DIAGNOSIS — E878 Other disorders of electrolyte and fluid balance, not elsewhere classified: Secondary | ICD-10-CM

## 2017-10-25 LAB — DIFFERENTIAL
BASOS ABS: 0 10*3/uL (ref 0.0–0.1)
Basophils Relative: 0 %
Eosinophils Absolute: 0.2 10*3/uL (ref 0.0–0.7)
Eosinophils Relative: 10 %
LYMPHS ABS: 1 10*3/uL (ref 0.7–4.0)
LYMPHS PCT: 43 %
Monocytes Absolute: 0.1 10*3/uL (ref 0.1–1.0)
Monocytes Relative: 3 %
NEUTROS PCT: 44 %
Neutro Abs: 1 10*3/uL — ABNORMAL LOW (ref 1.7–7.7)

## 2017-10-25 LAB — CBC
HEMATOCRIT: 23.1 % — AB (ref 36.0–46.0)
HEMOGLOBIN: 7.7 g/dL — AB (ref 12.0–15.0)
MCH: 19.5 pg — ABNORMAL LOW (ref 26.0–34.0)
MCHC: 33.3 g/dL (ref 30.0–36.0)
MCV: 58.6 fL — AB (ref 78.0–100.0)
Platelets: 271 10*3/uL (ref 150–400)
RBC: 3.94 MIL/uL (ref 3.87–5.11)
RDW: 17.5 % — ABNORMAL HIGH (ref 11.5–15.5)
WBC: 2.4 10*3/uL — ABNORMAL LOW (ref 4.0–10.5)

## 2017-10-25 LAB — BASIC METABOLIC PANEL
ANION GAP: 9 (ref 5–15)
BUN: 7 mg/dL (ref 6–20)
CHLORIDE: 99 mmol/L — AB (ref 101–111)
CO2: 25 mmol/L (ref 22–32)
CREATININE: 0.47 mg/dL (ref 0.44–1.00)
Calcium: 8.3 mg/dL — ABNORMAL LOW (ref 8.9–10.3)
GFR calc non Af Amer: >60 mL/min (ref >60)
GLUCOSE: 92 mg/dL (ref 65–99)
Potassium: 3.1 mmol/L — ABNORMAL LOW (ref 3.5–5.1)
Sodium: 133 mmol/L — ABNORMAL LOW (ref 135–145)

## 2017-10-25 LAB — ABO/RH: ABO/RH(D): B POS

## 2017-10-25 LAB — MAGNESIUM: Magnesium: 1.3 mg/dL — ABNORMAL LOW (ref 1.7–2.4)

## 2017-10-25 LAB — PREPARE RBC (CROSSMATCH)

## 2017-10-25 MED ORDER — MIRTAZAPINE 15 MG PO TABS
15.0000 mg | ORAL_TABLET | Freq: Every day | ORAL | Status: DC
  Administered 2017-10-25 – 2017-10-26 (×2): 15 mg via ORAL
  Filled 2017-10-25: qty 1

## 2017-10-25 MED ORDER — TBO-FILGRASTIM 300 MCG/0.5ML ~~LOC~~ SOSY
300.0000 ug | PREFILLED_SYRINGE | Freq: Every day | SUBCUTANEOUS | Status: DC
  Administered 2017-10-25: 300 ug via SUBCUTANEOUS
  Filled 2017-10-25: qty 0.5

## 2017-10-25 MED ORDER — MAGNESIUM SULFATE 2 GM/50ML IV SOLN
2.0000 g | Freq: Once | INTRAVENOUS | Status: AC
  Administered 2017-10-25: 2 g via INTRAVENOUS
  Filled 2017-10-25: qty 50

## 2017-10-25 MED ORDER — LIDOCAINE HCL 1 % IJ SOLN
INTRAMUSCULAR | Status: AC
  Filled 2017-10-25: qty 20

## 2017-10-25 MED ORDER — ACETAMINOPHEN 325 MG PO TABS
650.0000 mg | ORAL_TABLET | Freq: Once | ORAL | Status: AC
  Administered 2017-10-25: 650 mg via ORAL
  Filled 2017-10-25: qty 2

## 2017-10-25 MED ORDER — CYANOCOBALAMIN 1000 MCG/ML IJ SOLN
1000.0000 ug | Freq: Once | INTRAMUSCULAR | Status: AC
  Administered 2017-10-25: 1000 ug via INTRAMUSCULAR
  Filled 2017-10-25: qty 1

## 2017-10-25 MED ORDER — DIPHENHYDRAMINE HCL 25 MG PO CAPS
25.0000 mg | ORAL_CAPSULE | Freq: Once | ORAL | Status: AC
  Administered 2017-10-25: 25 mg via ORAL
  Filled 2017-10-25: qty 1

## 2017-10-25 MED ORDER — SODIUM CHLORIDE 0.9 % IV SOLN
Freq: Once | INTRAVENOUS | Status: AC
  Administered 2017-10-25: 11:00:00 via INTRAVENOUS

## 2017-10-25 MED ORDER — POLYETHYLENE GLYCOL 3350 17 G PO PACK
17.0000 g | PACK | Freq: Every day | ORAL | Status: DC
  Administered 2017-10-25 – 2017-10-27 (×3): 17 g via ORAL
  Filled 2017-10-25 (×3): qty 1

## 2017-10-25 MED ORDER — BOOST PLUS PO LIQD
237.0000 mL | Freq: Three times a day (TID) | ORAL | Status: DC
  Administered 2017-10-25 – 2017-10-27 (×5): 237 mL via ORAL
  Filled 2017-10-25 (×8): qty 237

## 2017-10-25 MED ORDER — CYANOCOBALAMIN 1000 MCG/ML IJ SOLN
INTRAMUSCULAR | Status: AC
  Filled 2017-10-25: qty 1

## 2017-10-25 MED ORDER — POTASSIUM CHLORIDE CRYS ER 10 MEQ PO TBCR
40.0000 meq | EXTENDED_RELEASE_TABLET | Freq: Two times a day (BID) | ORAL | Status: AC
  Administered 2017-10-25 (×2): 40 meq via ORAL
  Filled 2017-10-25 (×2): qty 4

## 2017-10-25 NOTE — Progress Notes (Signed)
Jo Parker   DOB:1946-02-23   BJ#:[id number]    Assessment & Plan:  Peritoneal carcinomatosis She had received 1 dose of chemotherapy. Continue supportive care  Acquired pancytopenia Due to chemotherapy We discussed some of the risks, benefits, and alternatives of blood transfusions. The patient is symptomatic from anemia and the hemoglobin level is critically low.  Some of the side-effects to be expected including risks of transfusion reactions, chills, infection, syndrome of volume overload and risk of hospitalization from various reasons and the patient is willing to proceed and went ahead to sign consent today. She agreed for 1 unit of irradiated blood I will also order G-CSF support daily until [facility] is greater than 1.5  Vitamin B12 deficiency I will give her 1 dose of vitamin B12 injection today  Malignant cachexia I will start her on Remeron  Recent sepsis/fever She has been afebrile since broad-spectrum IV antibiotics I recommend holding off further antibiotics unless she had recurrent fever  Recurrent pleural effusion and ascites Secondary to malignancy She has therapeutic relief after her thoracentesis and anticipate paracentesis  Profound weakness Secondary to malignancy, severe anemia and electrolyte imbalance Recommend PT evaluation for safety  Protein calorie malnutrition and electrolyte imbalance I started her on Remeron Replacing electrolytes as needed  Discharge planning In my opinion, she is too weak to be discharged today I recommend close blood count monitoring over the next 2 days with possibility of being discharged within the next 48 to 72 hours  [name], MD 10/25/2017  11:30 AM   Subjective:  Patient is well-known to me.  I was notified that she is being admitted for possible sepsis.  She underwent multiple imaging studies which showed recurrent pleural effusion and ascites. She was started on broad-spectrum IV antibiotics.  Cultures were  negative so far.  She is becoming more pancytopenic with significant electrolyte imbalance Today, she appears very weak.  She has poor appetite. She had mild constipation but much improved since I saw her She denies nausea The patient denies any recent signs or symptoms of bleeding such as spontaneous epistaxis, hematuria or hematochezia.    Primary peritoneal carcinomatosis ([place])   08/18/2017 Imaging    US imaging showed scular AAA measured 3.8 cm, liver nodularity with ascites and pleural effusions      09/03/2017 Imaging    CT chest elsewhere showed small bilateral pleural effusion      09/13/2017 Pathology Results    Fluid cytology positive for adenocarcinoma, strongly positive for CK7, CA-125, moderate to strong MOC-31 and B72.3. 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      10/08/2017 Procedure    Extensive omental and peritoneal tumor is present anteriorly deep to the abdominal wall. From a midline approach, tumor was sampled just to the left of midline in the upper pelvis, below the level of the umbilicus. Solid tissue was obtained.  IMPRESSION: CT-guided core biopsy performed of omental/peritoneal tumor in the anterior peritoneal cavity.      10/08/2017 Pathology Results    Soft Tissue Needle Core Biopsy, Omentum/Peritoneal Cavity - POORLY DIFFERENTIATED CARCINOMA. Microscopic Comment Immunohistochemistry will be performed and reported as an addendum. Dr. [name] agrees. Called to [employer] on 10/11/17. (JDP:kh 10/11/17) ADDENDUM:  Immunohistochemistry shows the tumor is positive with cytokeratin AE1/AE3, cytokeratin 7, estrogen receptor, WT-1, and p16.  The tumor is negative with CD56, chromogranin, synaptophysin, CDX-2, cytokeratin 20, progesterone receptor, GATA-3, GCDFP, Napsin-A and thyroid transcription factor-1. 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 The catheter is ready for use.      10/14/2017 Procedure    A total of approximately 1300 mL of ascitic fluid was removed.      10/15/2017 Tumor Marker    Patient's tumor was tested for the following markers: CA-125 Results of the tumor marker test revealed 775.9      Objective:  Vitals:   10/25/17 0501 10/25/17 0854  BP: (!) 150/87   Pulse: 89   Resp: 20   Temp: 98.8 F (37.1 C)   SpO2: 97% 97%     Intake/Output Summary (Last 24 hours) at 10/25/2017 1130 Last data filed at 10/25/2017 0509 Gross per 24 hour  Intake 240 ml  Output 400 ml  Net -160 ml    GENERAL:alert, no distress and comfortable.  She looks thin, frail and cachectic.  She have oxygen delivering via nasal cannula SKIN: skin color, texture, turgor are normal, no rashes or significant lesions EYES: normal, Conjunctiva are pale and non-injected, sclera clear OROPHARYNX:no exudate, no erythema and lips, buccal mucosa, and tongue normal  NECK: supple, thyroid normal size, non-tender, without nodularity LYMPH:  no palpable lymphadenopathy in the cervical, axillary or inguinal LUNGS: clear to auscultation and percussion with normal breathing effort HEART: regular rate & rhythm and no murmurs and no lower extremity edema ABDOMEN:abdomen soft, palpable abdominal mass, stable.  Nontender. Musculoskeletal:no cyanosis of digits and no  clubbing  NEURO: alert & oriented x 3 with fluent speech, no focal motor/sensory deficits   Labs:  Lab Results  Component Value Date   WBC 2.4 (L) 10/25/2017   HGB 7.7 (L) 10/25/2017   HCT 23.1 (L) 10/25/2017   MCV 58.6 (L) 10/25/2017   PLT 271 10/25/2017   NEUTROABS 1.0 (L) 10/25/2017    Lab Results  Component Value Date   NA 133 (L) 10/25/2017   K 3.1 (L) 10/25/2017   CL 99 (L) 10/25/2017   CO2 25 10/25/2017    Studies:  Dg Chest 1 View  Result Date: 10/24/2017 CLINICAL DATA:  Post left thoracentesis.  Peritoneal carcinomatosis. EXAM: CHEST  1 VIEW COMPARISON:  10/23/2017 FINDINGS: Right IJ Port-A-Cath unchanged with tip over the SVC. Metallic device over the left chest unchanged. Lungs are adequately inflated with significant improvement in the left-sided pleural effusion post thoracentesis. No evidence pneumothorax. There is a small residual amount of left pleural fluid likely with associated basilar atelectasis. Remainder of the exam is unchanged. IMPRESSION: Improved left pleural effusion post thoracentesis. Small amount of residual left pleural fluid/basilar atelectasis. No pneumothorax. Electronically Signed   By: Marin Olp M.D.   On: 10/24/2017 10:16

## 2017-10-25 NOTE — Progress Notes (Signed)
  Oncology Nurse Navigator Documentation  Navigator Location: [facility] (10/25/17 1400)   )Navigator Encounter Type: Other (10/25/17 1400)                       Treatment Phase: Active Tx (10/25/17 1400) Barriers/Navigation Needs: Family concerns;Education(met with son to discuss recent hospitalization) (10/25/17 1400)                Acuity: Level 2 (10/25/17 1400)   Acuity Level 2: Ongoing guidance and education throughout treatment as needed (10/25/17 1400)     Time Spent with Patient: 15 (10/25/17 1400)

## 2017-10-25 NOTE — Telephone Encounter (Signed)
Patient's son, Jo Parker called and said Jo Parker had been admitted to [facility] Friday night.  He said she was running a fever of 102-103 degrees.  She had a thoracentesis yesterday and will have a paracentesis today.  He said she may be discharged today and wanted to make Dr. Alvy Bimler aware.

## 2017-10-25 NOTE — Telephone Encounter (Signed)
I have cancelled her appt in the clinic and plan to go up and check on her this morning

## 2017-10-25 NOTE — Progress Notes (Signed)
Initial Nutrition Assessment  DOCUMENTATION CODES:   Non-severe (moderate) malnutrition in context of chronic illness  INTERVENTION:    Boost Plus chocolate TID- Each supplement provides 360kcal and 14g protein.    Obtain actual weight reading  NUTRITION DIAGNOSIS:   Moderate Malnutrition related to chronic illness, cancer and cancer related treatments as evidenced by energy intake < or equal to 75% for > or equal to 1 month, moderate fat depletion, severe muscle depletion, moderate muscle depletion.  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 Admitted with sepsis in the setting of tachycardia and tachypnea.    Spoke with pt at bedside. Reports appetite has been poor over the last 2-3 months since before being diagnosed. States she doesn't eat meals throughout the day but picks at "different kinds of foods." Pt has not used supplementation at home but is open to it this admission. Pt complains of taste changes and denies difficulty swallowing. She has not attempted to eat today. RD to order Boost vs Ensure as pt likes the taste better.   Pt endorses a UBW of 120 lb. Records indicate pt has maintained her weight of 115-118 lb since 09/29/17. Will attempt to get actual wt reading this admission as most weights are stated. Nutrition-Focused physical exam completed.   Medications reviewed and include: Vit B12, Mg sulfate 2g Labs reviewed: Na 133 (L) K 3.1 (L) Mg 1.3 (L)  NUTRITION - FOCUSED PHYSICAL EXAM:    Most Recent Value  Orbital Region  Mild depletion  Upper Arm Region  Moderate depletion  Thoracic and Lumbar Region  Moderate depletion  Buccal  Region  Mild depletion  Temple Region  Moderate depletion  Clavicle Bone Region  Severe depletion  Clavicle and Acromion Bone Region  Severe depletion  Scapular Bone Region  Moderate depletion  Dorsal Hand  Moderate depletion  Patellar Region  Severe depletion  Anterior Thigh Region  Severe depletion  Posterior Calf Region  Severe depletion  Edema (RD Assessment)  None  Hair  Reviewed  Eyes  Reviewed  Mouth  Reviewed  Skin  Reviewed  Nails  Reviewed     Diet Order:   Diet Order           Diet regular Room service appropriate? Yes; Fluid consistency: Thin  Diet effective now          EDUCATION NEEDS:   Not appropriate for education at this time  Skin:  Skin Assessment: Skin Integrity Issues: Skin Integrity Issues:: Incisions Incisions: abdomen  Last BM:  10/23/17  Height:   Ht Readings from Last 1 Encounters:  10/23/17 5\' 3"  (1.6 m)    Weight:   Wt Readings from Last 1 Encounters:  10/23/17 116 lb (52.6 kg)    Ideal Body Weight:  52.2 kg  BMI:  Body mass index is 20.55 kg/m.  Estimated Nutritional Needs:   Kcal:  1600-1800 kcal  Protein:  80-90 g  Fluid:  >1.6 L/day    Mariana Single RD, LDN Clinical Nutrition Pager # [phone]

## 2017-10-25 NOTE — Progress Notes (Signed)
PROGRESS NOTE    Jo Parker  WPY:[id number] DOB: May 07, 1946 DOA: 10/22/2017 PCP: Patient, [name] Per   Brief Narrative:  HPI On 10/23/2017 by Dr. Brigid Re Purohit  Jo Parker is a 72 y.o. 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  Patient reports that she was doing fairly well until last night when she began to have chills.  Her temperature orally was 100.9.  She did not have any nausea, vomiting, abdominal pain, chest pain, diarrhea.  She did have some shortness of breath associated with this chills and fever.  She has not had any rash.  Interim history Admitted with possible sepsis, however no infection found.  Status post left thoracentesis.  Pending paracentesis.  Oncology also consulted.  Assessment & Plan   Dyspnea secondary to pleural effusions with tachypnea and tachycardia -Sepsis ruled out as of now.  Patient did present with tachycardia and tachypnea however feel that this is likely due to her pleural effusions -Chest x-ray as well as chest CT chest unremarkable for infection -UA unremarkable for infection, urine culture showed multiple species -Blood Cultures pending -CTA chest was negative for PE.  Did show moderate sized left pleural effusion, small to moderate right effusion. -Interventional radiology consulted, status post ultrasound-guided left thoracentesis yielding 580 cc of hazy blood-tinged colored fluid. Labs pendin -Vital signs improving -She was empirically started on vancomycin and cefepime, but discontinued   Peritoneal carcinomatosis with ascites -CTA abdomen pelvis showed known extensive peritoneal carcinomatosis and moderate ascites. -Paracentesis  pending -Oncology, Dr. Alvy Bimler consulted and appreciated -Continue pain control  Chronic microcytic Anemia -hemoglobin earlier this month between 9-10 -Currently hemoglobin 7.7 -discussed with oncology, will transfuse 1 unit PRBC -Recent anemia panel 10/15/2017 showed a iron of 25, ferritin 400, saturation ratio 14 -Continue to monitor CBC  Leukopenia with neutropenia -currently afebrile -antibiotics were discontinued -drop in WBC to 2.4/ neutrophils 1 -discussed with oncology, plan for granix -continue to monitor CBC  COPD -Continue Dulera  Essential hypertension with current hypotension -BP currently stable, 150/87- will restart coreg  Hyperlipidemia -Continue statin  Pseudoaneurysm -Add on CT a abdomen pelvis, 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 -She may need to follow-up with vascular surgery as an outpatient.  Hypokalemia -potassium 3.1-despite replacement, will replace and continue to monitor BMP -will also check magnesium level  Depression -Continue Celexa  DVT Prophylaxis Lovenox  Code Status: Full  Family Communication:  Husband at bedside  Disposition Plan: Admitted.  Pending paracentesis and improvement in patient's respiratory status as well as pain.  Consultants Interventional radiology Oncology  Procedures  Ultrasound-guided left thoracentesis  Antibiotics   Anti-infectives (From admission, onward)   Start     Dose/Rate Route Frequency Ordered Stop   10/24/17 0400  vancomycin (VANCOCIN) IVPB 750 mg/150 ml premix  Status:  Discontinued     750 mg 150 mL/hr over 60 Minutes Intravenous Every 24 hours 10/23/17 0926 10/23/17 0927   10/24/17 0400  vancomycin (VANCOCIN) IVPB 1000 mg/200 mL premix  Status:  Discontinued     1,000 mg 200 mL/hr over 60 Minutes Intravenous Every 24 hours 10/23/17 0927 10/24/17 1202   10/23/17 1000  ceFEPIme (MAXIPIME) 1 g in sodium chloride 0.9 % 100 mL IVPB  Status:  Discontinued  1 g 200  mL/hr over 30 Minutes Intravenous Every 8 hours 10/23/17 0931 10/24/17 1202   10/23/17 0945  ceFEPIme (MAXIPIME) 1 g in sodium chloride 0.9 % 100 mL IVPB  Status:  Discontinued     1 g 200 mL/hr over 30 Minutes Intravenous Every 8 hours 10/23/17 0931 10/23/17 0931   10/23/17 0415  piperacillin-tazobactam (ZOSYN) IVPB 3.375 g     3.375 g 100 mL/hr over 30 Minutes Intravenous  Once 10/23/17 0408 10/23/17 0503   10/23/17 0415  vancomycin (VANCOCIN) IVPB 1000 mg/200 mL premix  Status:  Discontinued     1,000 mg 200 mL/hr over 60 Minutes Intravenous  Once 10/23/17 0408 10/23/17 0412   10/23/17 0415  vancomycin (VANCOCIN) 1,250 mg in sodium chloride 0.9 % 250 mL IVPB     1,250 mg 166.7 mL/hr over 90 Minutes Intravenous  Once 10/23/17 0412 10/23/17 0627      Subjective:   Jo Parker seen and examined today.  He is to feel somewhat short of breath today but improved since coming into the hospital.  Denies current chest pain, abdominal pain, nausea or vomiting, diarrhea or.  Does state that she does not have bowel movements every day as she once did.  Objective:   Vitals:   10/24/17 2030 10/24/17 2114 10/25/17 0501 10/25/17 0854  BP: (!) 150/87  (!) 150/87   Pulse: 85  89   Resp: 20  20   Temp: 98.2 F (36.8 C)  98.8 F (37.1 C)   TempSrc: Oral  Oral   SpO2: 98% 97% 97% 97%  Weight:      Height:        Intake/Output Summary (Last 24 hours) at 10/25/2017 1029 Last data filed at 10/25/2017 0509 Gross per 24 hour  Intake 240 ml  Output 400 ml  Net -160 ml   Filed Weights   10/23/17 0006  Weight: 52.6 kg (116 lb)   Exam  General: Well developed, thin, elderly, no apparent distress  HEENT: NCAT, mucous membranes moist.   Neck: Supple  Cardiovascular: S1 S2 auscultated, 1/6 SEM, RRR  Respiratory: Diminished breath sounds however clear.  No rhonchi or wheezing.  Abdomen: Nontender, +distended, + bowel sounds  Extremities: warm dry without cyanosis clubbing or  edema  Neuro: AAOx3, nonfocal  Psych: appropriate mood and affect  Data Reviewed: I have personally reviewed following labs and imaging studies  CBC: Recent Labs  Lab 10/23/17 0020 10/24/17 0543 10/25/17 0430  WBC 6.4 3.3* 2.4*  NEUTROABS 5.0  --  1.0*  HGB 8.9* 7.3* 7.7*  HCT 27.8* 23.0* 23.1*  MCV 59.4* 59.4* 58.6*  PLT 357 244 673   Basic Metabolic Panel: Recent Labs  Lab 10/23/17 0020 10/24/17 0543 10/25/17 0430  NA 132* 132* 133*  K 3.5 3.2* 3.1*  CL 98* 100* 99*  CO2 23 21* 25  GLUCOSE 140* 122* 92  BUN 17 8 7   CREATININE 0.58 0.41* 0.47  CALCIUM 8.4* 8.0* 8.3*  MG  --   --  1.3*   GFR: Estimated Creatinine Clearance: 52.6 mL/min (by C-G formula based on SCr of 0.47 mg/dL). Liver Function Tests: Recent Labs  Lab 10/23/17 0020  AST 30  ALT 21  ALKPHOS 59  BILITOT 0.6  PROT 6.4*  ALBUMIN 2.5*   No results for input(s): LIPASE, AMYLASE in the last 168 hours. No results for input(s): AMMONIA in the last 168 hours. Coagulation Profile: Recent Labs  Lab 10/23/17 0020  INR 1.23   Cardiac  Enzymes: No results for input(s): CKTOTAL, CKMB, CKMBINDEX, TROPONINI in the last 168 hours. BNP (last 3 results) No results for input(s): PROBNP in the last 8760 hours. HbA1C: No results for input(s): HGBA1C in the last 72 hours. CBG: No results for input(s): GLUCAP in the last 168 hours. Lipid Profile: No results for input(s): CHOL, HDL, LDLCALC, TRIG, CHOLHDL, LDLDIRECT in the last 72 hours. Thyroid Function Tests: No results for input(s): TSH, T4TOTAL, FREET4, T3FREE, THYROIDAB in the last 72 hours. Anemia Panel: No results for input(s): VITAMINB12, FOLATE, FERRITIN, TIBC, IRON, RETICCTPCT in the last 72 hours. Urine analysis:    Component Value Date/Time   COLORURINE YELLOW 10/23/2017 0004   APPEARANCEUR HAZY (A) 10/23/2017 0004   LABSPEC 1.020 10/23/2017 0004   PHURINE 6.0 10/23/2017 0004   GLUCOSEU NEGATIVE 10/23/2017 0004   HGBUR NEGATIVE  10/23/2017 0004   BILIRUBINUR NEGATIVE 10/23/2017 0004   KETONESUR 5 (A) 10/23/2017 0004   PROTEINUR NEGATIVE 10/23/2017 0004   NITRITE NEGATIVE 10/23/2017 0004   LEUKOCYTESUR TRACE (A) 10/23/2017 0004   Sepsis Labs: @LABRCNTIP (procalcitonin:4,lacticidven:4)  ) Recent Results (from the past 240 hour(s))  Culture, blood (Routine x 2)     Status: None (Preliminary result)   Collection Time: 10/23/17 12:20 AM  Result Value Ref Range Status   Specimen Description   Final    BLOOD PORTA CATH Performed at [facility], [place] [address], [place], [place] [address]    Special Requests   Final    BOTTLES DRAWN AEROBIC ONLY Blood Culture adequate volume Performed at [facility] [address] [place], [place] [address]    Culture   Final    NO GROWTH 1 DAY Performed at [facility], [place] [address], [place], [place] [address]    Report Status PENDING  Incomplete  Culture, blood (Routine x 2)     Status: None (Preliminary result)   Collection Time: 10/23/17  4:18 AM  Result Value Ref Range Status   Specimen Description   Final    BLOOD BLOOD RIGHT HAND Performed at [facility] [address], [place], [place] [address]    Special Requests   Final    BOTTLES DRAWN AEROBIC AND ANAEROBIC Blood Culture adequate volume Performed at [facility] [address], [place], [place] [address]    Culture   Final    NO GROWTH 1 DAY Performed at [facility], [place] [address] [place], [place] [address]    Report Status PENDING  Incomplete  Urine culture     Status: Abnormal   Collection Time: 10/23/17  4:18 AM  Result Value Ref Range Status   Specimen Description   Final    URINE, RANDOM Performed at [facility] [address] [place], [place] [address]    Special Requests   Final    NONE Performed at [facility], [place] [address], [place], [name] [address]     Culture MULTIPLE SPECIES PRESENT, SUGGEST RECOLLECTION (A)  Final   Report Status 10/24/2017 FINAL  Final  Gram stain     Status: None   Collection Time: 10/24/17  9:35 AM  Result Value Ref Range Status   Specimen Description PLEURAL LEFT  Final   Special Requests NONE  Final   Gram Stain   Final    FEW WBC PRESENT, PREDOMINANTLY MONONUCLEAR NO ORGANISMS SEEN Performed at [facility], [place] [address] [place],  [address]  Report Status 10/24/2017 FINAL  Final      Radiology Studies: Dg Chest 1 View  Result Date: 10/24/2017 CLINICAL DATA:  Post left thoracentesis.  Peritoneal carcinomatosis. EXAM: CHEST  1 VIEW COMPARISON:  10/23/2017 FINDINGS: Right IJ Port-A-Cath unchanged with tip over the SVC. Metallic device over the left chest unchanged. Lungs are adequately inflated with significant improvement in the left-sided pleural effusion post thoracentesis. No evidence pneumothorax. There is a small residual amount of left pleural fluid likely with associated basilar atelectasis. Remainder of the exam is unchanged. IMPRESSION: Improved left pleural effusion post thoracentesis. Small amount of residual left pleural fluid/basilar atelectasis. No pneumothorax. Electronically Signed   By: Marin Olp M.D.   On: 10/24/2017 10:16     Scheduled Meds: . aspirin EC  81 mg Oral Daily  . atorvastatin  80 mg Oral Daily  . citalopram  40 mg Oral Daily  . enoxaparin (LOVENOX) injection  40 mg Subcutaneous Q24H  . feeding supplement (ENSURE ENLIVE)  237 mL Oral BID BM  . lubiprostone  8 mcg Oral BID WC  . mometasone-formoterol  2 puff Inhalation BID  . potassium chloride  40 mEq Oral BID  . Tbo-filgastrim (GRANIX) SQ  300 mcg Subcutaneous Daily   Continuous Infusions: . magnesium sulfate 1 - 4 g bolus IVPB       LOS: 2 days   Time Spent in minutes   45 minutes (greater than 50% of time spent with patient and family face to face, answering all questions, as well as reviewing  chart and images, and formulating a plan)  Kaitlin Ardito D.O. on 10/25/2017 at 10:29 AM  Between 7am to 7pm - Pager - [phone]  After 7pm go to [email or web address] - password [email or web address]  And look for the night coverage person covering for me after hours  [facility] Office  [phone]

## 2017-10-26 DIAGNOSIS — E440 Moderate protein-calorie malnutrition: Secondary | ICD-10-CM

## 2017-10-26 DIAGNOSIS — R64 Cachexia: Secondary | ICD-10-CM

## 2017-10-26 LAB — COMPREHENSIVE METABOLIC PANEL
ALBUMIN: 2.3 g/dL — AB (ref 3.5–5.0)
ALK PHOS: 69 U/L (ref 38–126)
ALT: 19 U/L (ref 14–54)
AST: 29 U/L (ref 15–41)
Anion gap: 10 (ref 5–15)
BUN: 7 mg/dL (ref 6–20)
CALCIUM: 8.6 mg/dL — AB (ref 8.9–10.3)
CHLORIDE: 100 mmol/L — AB (ref 101–111)
CO2: 25 mmol/L (ref 22–32)
CREATININE: 0.51 mg/dL (ref 0.44–1.00)
GFR calc Af Amer: >60 mL/min (ref >60)
GFR calc non Af Amer: >60 mL/min (ref >60)
GLUCOSE: 88 mg/dL (ref 65–99)
Potassium: 4.4 mmol/L (ref 3.5–5.1)
SODIUM: 135 mmol/L (ref 135–145)
Total Bilirubin: 0.7 mg/dL (ref 0.3–1.2)
Total Protein: 6.3 g/dL — ABNORMAL LOW (ref 6.5–8.1)

## 2017-10-26 LAB — BPAM RBC
BLOOD PRODUCT EXPIRATION DATE: 201906162359
ISSUE DATE / TIME: 201905201214
UNIT TYPE AND RH: 7300

## 2017-10-26 LAB — CBC WITH DIFFERENTIAL/PLATELET
BASOS PCT: 1 %
Basophils Absolute: 0 10*3/uL (ref 0.0–0.1)
Eosinophils Absolute: 0.3 10*3/uL (ref 0.0–0.7)
Eosinophils Relative: 7 %
HEMATOCRIT: 30.6 % — AB (ref 36.0–46.0)
HEMOGLOBIN: 10.2 g/dL — AB (ref 12.0–15.0)
LYMPHS ABS: 1.1 10*3/uL (ref 0.7–4.0)
LYMPHS PCT: 28 %
MCH: 20.6 pg — AB (ref 26.0–34.0)
MCHC: 33.3 g/dL (ref 30.0–36.0)
MCV: 61.7 fL — AB (ref 78.0–100.0)
MONO ABS: 0.4 10*3/uL (ref 0.1–1.0)
MONOS PCT: 12 %
NEUTROS ABS: 2 10*3/uL (ref 1.7–7.7)
NEUTROS PCT: 52 %
Platelets: 383 10*3/uL (ref 150–400)
RBC: 4.96 MIL/uL (ref 3.87–5.11)
RDW: 21.4 % — ABNORMAL HIGH (ref 11.5–15.5)
WBC: 3.8 10*3/uL — ABNORMAL LOW (ref 4.0–10.5)

## 2017-10-26 LAB — TYPE AND SCREEN
ABO/RH(D): B POS
Antibody Screen: NEGATIVE
Unit division: 0

## 2017-10-26 LAB — MAGNESIUM: Magnesium: 1.8 mg/dL (ref 1.7–2.4)

## 2017-10-26 MED ORDER — CITALOPRAM HYDROBROMIDE 20 MG PO TABS
20.0000 mg | ORAL_TABLET | Freq: Every day | ORAL | Status: DC
  Administered 2017-10-27: 20 mg via ORAL
  Filled 2017-10-26: qty 1

## 2017-10-26 MED ORDER — CARVEDILOL 12.5 MG PO TABS
12.5000 mg | ORAL_TABLET | Freq: Two times a day (BID) | ORAL | Status: DC
  Administered 2017-10-26 – 2017-10-27 (×3): 12.5 mg via ORAL
  Filled 2017-10-26 (×3): qty 1

## 2017-10-26 NOTE — Care Management Important Message (Signed)
Important Message  Patient Details  Name: Jo Parker MRN: [id number] Date of Birth: 1945-12-11   Medicare Important Message Given:  Yes    Kerin Salen 10/26/2017, 11:20 AMImportant Message  Patient Details  Name: Jo Parker MRN: [id number] Date of Birth: 30-Sep-1945   Medicare Important Message Given:  Yes    Kerin Salen 10/26/2017, 11:20 AM

## 2017-10-26 NOTE — Progress Notes (Addendum)
Physical Therapy Treatment Patient Details Name: Jo Parker MRN: [id number] DOB: 09-19-1945 Today's Date: 10/26/2017    History of Present Illness 72 y.o. 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 Dx of sepsis.    PT Comments    Pt progressing well with mobility, she ambulated 160' with RW, no loss of balance, distance limited by fatigue. Pt reports decreased abdominal pressure and discomfort. She plans to DC to her son's home where she has 6 steps to enter.   Follow Up Recommendations  Home health PT;Supervision for mobility/OOB     Equipment Recommendations  Rolling walker with 5" wheels;3in1 (PT)(rollator (4 wheeled RW with seat))    Recommendations for Other Services       Precautions / Restrictions Precautions Precautions: Fall Precaution Comments: denies falls in past 1 year Restrictions Weight Bearing Restrictions: No    Mobility  Bed Mobility Overal bed mobility: Needs Assistance Bed Mobility: Supine to Sit     Supine to sit: Min assist;HOB elevated     General bed mobility comments: min A to raise trunk, increased time 2* pain  Transfers Overall transfer level: Needs assistance Equipment used: Rolling walker (2 wheeled) Transfers: Sit to/from Stand Sit to Stand: Min guard         General transfer comment: VCs hand placements  Ambulation/Gait Ambulation/Gait assistance: Min guard Ambulation Distance (Feet): 160 Feet Assistive device: Rolling walker (2 wheeled) Gait Pattern/deviations: Step-through pattern;Decreased stride length     General Gait Details: steady with RW, no loss of balance, distance limited by fatigue   Stairs              Wheelchair Mobility    Modified Rankin (Stroke Patients Only)       Balance Overall balance assessment: Needs assistance   Sitting balance-Leahy Scale: Good     Standing balance support: Single extremity supported Standing balance-Leahy Scale: Poor Standing balance comment: requires single UE support                            Cognition Arousal/Alertness: Awake/alert Behavior During Therapy: WFL for tasks assessed/performed Overall Cognitive Status: Within Functional Limits for tasks assessed                                        Exercises      General Comments        Pertinent Vitals/Pain Pain Assessment: No/denies pain    Home Living                      Prior Function            PT Goals (current goals can now be found in the care plan section) Acute Rehab PT Goals Patient Stated Goal: go to grandson's high school graduation early June PT Goal Formulation: With patient/family Time For Goal Achievement: 11/07/17 Potential to Achieve Goals: Fair Progress towards PT goals: Progressing toward goals    Frequency    Min 3X/week      PT Plan Current plan remains appropriate    Co-evaluation  AM-PAC PT "6 Clicks" Daily Activity  Outcome Measure  Difficulty turning over in bed (including adjusting bedclothes, sheets and blankets)?: A Little Difficulty moving from lying on back to sitting on the side of the bed? : Unable Difficulty sitting down on and standing up from a chair with arms (e.g., wheelchair, bedside commode, etc,.)?: A Little Help needed moving to and from a bed to chair (including a wheelchair)?: A Little Help needed walking in hospital room?: A Little Help needed climbing 3-5 steps with a railing? : A Lot 6 Click Score: 15    End of Session Equipment Utilized During Treatment: Gait belt Activity Tolerance: Patient tolerated treatment well Patient left: with call bell/phone  within reach;with family/visitor present(on toilet) Nurse Communication: Mobility status(pt in bathroom) PT Visit Diagnosis: Difficulty in walking, not elsewhere classified (R26.2);Muscle weakness (generalized) (M62.81)     Time: 2336-1224 PT Time Calculation (min) (ACUTE ONLY): 20 min  Charges:  $Gait Training: 8-22 mins                    G Codes:          Blondell Reveal Kistler 10/26/2017, 2:12 PM [phone]

## 2017-10-26 NOTE — Progress Notes (Signed)
Chaplain following due to spiritual care consult.    Spoke with pt about her expressing desire for advance directives.  She wishes to speak with her son about the material and will have nursing page chaplain when she is ready to complete.     [facility] Chaplain Jerene Pitch MDiv, [facility]

## 2017-10-26 NOTE — Progress Notes (Signed)
PROGRESS NOTE    Jo Parker  QZR:[id number] DOB: [birth year] DOA: 10/22/2017 PCP: Patient, [name] Per   Brief Narrative:  HPI On 10/23/2017 by Dr. Brigid Jo Parker  Jo Parker is a 72 y.o. 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  Patient reports that she was doing fairly well until last night when she began to have chills.  Her temperature orally was 100.9.  She did not have any nausea, vomiting, abdominal pain, chest pain, diarrhea.  She did have some shortness of breath associated with this chills and fever.  She has not had any rash.  Interim history Admitted with possible sepsis, however no infection found.  Status post left thoracentesis.  Pending paracentesis.  Oncology also consulted. Labs improving. Pending PT eval.   Assessment & Plan   Dyspnea secondary to pleural effusions with tachypnea and tachycardia -Sepsis ruled out as of now.  Patient did present with tachycardia and tachypnea however feel that this is likely due to her pleural effusions -Chest x-ray as well as chest CT chest unremarkable for infection -UA unremarkable for infection, urine culture showed multiple species -Blood Cultures no growth to date -CTA chest was negative for PE.  Did show moderate sized left pleural effusion, small to moderate right effusion. -Interventional radiology consulted, status post ultrasound-guided left thoracentesis yielding 580 cc of hazy blood-tinged colored fluid. Culture shows no growth to date -Vital signs improving -She was empirically started on vancomycin and cefepime, but discontinued   Peritoneal carcinomatosis with ascites -CTA abdomen pelvis showed known extensive  peritoneal carcinomatosis and moderate ascites. -Paracentesis performed as patient did not have enough fluid on ultrasound -Oncology, Dr. Alvy Bimler consulted and appreciated -Continue pain control  Chronic microcytic Anemia -hemoglobin earlier this month between 9-10 -Hemoglobin is down to 7.3, patient given 1 unit PRBC -Current hemoglobin 10.2 -Recent anemia panel 10/15/2017 showed a iron of 25, ferritin 400, saturation ratio 14 -Continue to monitor CBC  Leukopenia with neutropenia -Improving -currently afebrile -antibiotics were discontinued -discussed with oncology, patient given Granix -WBC currently up to 3.8, neutrophil count up to 2 -continue to monitor CBC  COPD -Continue Dulera  Essential hypertension with current hypotension -BP currently stable, 150/87 -Coreg restarted  Hyperlipidemia -Continue statin  Pseudoaneurysm -Add on CT a abdomen pelvis, 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 -She may need to follow-up with vascular surgery as an outpatient.  Hypokalemia/hypomagnesemia -Resolved, potassium now 4.4 -Magnesium replaced, currently 1.8 -Continue to monitor  Depression -Continue Celexa  Chronic constipation -Continue Amitiza  Deconditioning and weakness -PT consulted, pending evaluation  Moderate malnutrition -Context of chronic illness -Attrition consulted recommending boost plus 3 times daily  DVT Prophylaxis Lovenox  Code Status: Full  Family Communication:  Husband at bedside  Disposition Plan: Admitted.  Given current weakness, pending PT evaluation.  Discussed with Dr. Alvy Bimler, likely discharge to home on 10/27/2017  Consultants Interventional radiology Oncology  Procedures  Ultrasound-guided left thoracentesis  Antibiotics   Anti-infectives (From admission, onward)   Start     Dose/Rate Route Frequency Ordered Stop   10/24/17 0400  vancomycin (VANCOCIN) IVPB 750 mg/150 ml premix  Status:  Discontinued     750  mg 150 mL/hr over 60 Minutes Intravenous Every 24 hours 10/23/17 0926 10/23/17 0927   10/24/17  0400  vancomycin (VANCOCIN) IVPB 1000 mg/200 mL premix  Status:  Discontinued     1,000 mg 200 mL/hr over 60 Minutes Intravenous Every 24 hours 10/23/17 0927 10/24/17 1202   10/23/17 1000  ceFEPIme (MAXIPIME) 1 g in sodium chloride 0.9 % 100 mL IVPB  Status:  Discontinued     1 g 200 mL/hr over 30 Minutes Intravenous Every 8 hours 10/23/17 0931 10/24/17 1202   10/23/17 0945  ceFEPIme (MAXIPIME) 1 g in sodium chloride 0.9 % 100 mL IVPB  Status:  Discontinued     1 g 200 mL/hr over 30 Minutes Intravenous Every 8 hours 10/23/17 0931 10/23/17 0931   10/23/17 0415  piperacillin-tazobactam (ZOSYN) IVPB 3.375 g     3.375 g 100 mL/hr over 30 Minutes Intravenous  Once 10/23/17 0408 10/23/17 0503   10/23/17 0415  vancomycin (VANCOCIN) IVPB 1000 mg/200 mL premix  Status:  Discontinued     1,000 mg 200 mL/hr over 60 Minutes Intravenous  Once 10/23/17 0408 10/23/17 0412   10/23/17 0415  vancomycin (VANCOCIN) 1,250 mg in sodium chloride 0.9 % 250 mL IVPB     1,250 mg 166.7 mL/hr over 90 Minutes Intravenous  Once 10/23/17 0412 10/23/17 0627      Subjective:   Jo Parker seen and examined today.  Patient feels breathing is improving.  Denies current chest pain, abdominal pain, nausea or vomiting, dizziness or headache.  Does feel that she needs to have a bowel movement today.  Objective:   Vitals:   10/25/17 2035 10/25/17 2107 10/26/17 0540 10/26/17 0800  BP:  (!) 169/89 (!) 181/92   Pulse:  89 100   Resp:  17 18   Temp:  99.2 F (37.3 C) 98.5 F (36.9 C)   TempSrc:  Oral Oral   SpO2: 97% 92% 96% 95%  Weight:      Height:        Intake/Output Summary (Last 24 hours) at 10/26/2017 1116 Last data filed at 10/25/2017 1450 Gross per 24 hour  Intake 360 ml  Output 900 ml  Net -540 ml   Filed Weights   10/23/17 0006  Weight: 52.6 kg (116 lb)   Exam  General: Well developed, thin,  elderly, no apparent distress  HEENT: NCAT,  mucous membranes moist.   Neck: Supple  Cardiovascular: S1 S2 auscultated, RRR, soft SEM  Respiratory: Clear to auscultation bilaterally with equal chest rise  Abdomen: Soft, nontender, mildly distended, + bowel sounds  Extremities: warm dry without cyanosis clubbing or edema  Neuro: AAOx3, nonfocal  Psych: appropriate mood and affect  Data Reviewed: I have personally reviewed following labs and imaging studies  CBC: Recent Labs  Lab 10/23/17 0020 10/24/17 0543 10/25/17 0430 10/26/17 0558  WBC 6.4 3.3* 2.4* 3.8*  NEUTROABS 5.0  --  1.0* 2.0  HGB 8.9* 7.3* 7.7* 10.2*  HCT 27.8* 23.0* 23.1* 30.6*  MCV 59.4* 59.4* 58.6* 61.7*  PLT 357 244 271 176   Basic Metabolic Panel: Recent Labs  Lab 10/23/17 0020 10/24/17 0543 10/25/17 0430 10/26/17 0558  NA 132* 132* 133* 135  K 3.5 3.2* 3.1* 4.4  CL 98* 100* 99* 100*  CO2 23 21* 25 25  GLUCOSE 140* 122* 92 88  BUN 17 8 7 7   CREATININE 0.58 0.41* 0.47 0.51  CALCIUM 8.4* 8.0* 8.3* 8.6*  MG  --   --  1.3* 1.8   GFR: Estimated Creatinine Clearance: 52.6 mL/min (by C-G formula based on SCr of 0.51 mg/dL). Liver Function  Tests: Recent Labs  Lab 10/23/17 0020 10/26/17 0558  AST 30 29  ALT 21 19  ALKPHOS 59 69  BILITOT 0.6 0.7  PROT 6.4* 6.3*  ALBUMIN 2.5* 2.3*   No results for input(s): LIPASE, AMYLASE in the last 168 hours. No results for input(s): AMMONIA in the last 168 hours. Coagulation Profile: Recent Labs  Lab 10/23/17 0020  INR 1.23   Cardiac Enzymes: No results for input(s): CKTOTAL, CKMB, CKMBINDEX, TROPONINI in the last 168 hours. BNP (last 3 results) No results for input(s): PROBNP in the last 8760 hours. HbA1C: No results for input(s): HGBA1C in the last 72 hours. CBG: No results for input(s): GLUCAP in the last 168 hours. Lipid Profile: No results for input(s): CHOL, HDL, LDLCALC, TRIG, CHOLHDL, LDLDIRECT in the last 72 hours. Thyroid Function  Tests: No results for input(s): TSH, T4TOTAL, FREET4, T3FREE, THYROIDAB in the last 72 hours. Anemia Panel: No results for input(s): VITAMINB12, FOLATE, FERRITIN, TIBC, IRON, RETICCTPCT in the last 72 hours. Urine analysis:    Component Value Date/Time   COLORURINE YELLOW 10/23/2017 0004   APPEARANCEUR HAZY (A) 10/23/2017 0004   LABSPEC 1.020 10/23/2017 0004   PHURINE 6.0 10/23/2017 0004   GLUCOSEU NEGATIVE 10/23/2017 0004   HGBUR NEGATIVE 10/23/2017 0004   BILIRUBINUR NEGATIVE 10/23/2017 0004   KETONESUR 5 (A) 10/23/2017 0004   PROTEINUR NEGATIVE 10/23/2017 0004   NITRITE NEGATIVE 10/23/2017 0004   LEUKOCYTESUR TRACE (A) 10/23/2017 0004   Sepsis Labs: @LABRCNTIP (procalcitonin:4,lacticidven:4)  ) Recent Results (from the past 240 hour(s))  Culture, blood (Routine x 2)     Status: None (Preliminary result)   Collection Time: 10/23/17 12:20 AM  Result Value Ref Range Status   Specimen Description   Final    BLOOD PORTA CATH Performed at [facility], [place] [address], [place], [place] [address]    Special Requests   Final    BOTTLES DRAWN AEROBIC ONLY Blood Culture adequate volume Performed at [facility] [address], [place], [place] [address]    Culture   Final    NO GROWTH 2 DAYS Performed at [facility] [address], [place], [place] [address]    Report Status PENDING  Incomplete  Culture, blood (Routine x 2)     Status: None (Preliminary result)   Collection Time: 10/23/17  4:18 AM  Result Value Ref Range Status   Specimen Description   Final    BLOOD BLOOD RIGHT HAND Performed at [facility] [address], [place], [place] [address]    Special Requests   Final    BOTTLES DRAWN AEROBIC AND ANAEROBIC Blood Culture adequate volume Performed at [facility] [address], [place], [place] [address]    Culture   Final    NO GROWTH 2 DAYS Performed at [facility] [address], [place], [place] [address]    Report Status PENDING  Incomplete  Urine culture     Status: Abnormal   Collection Time: 10/23/17  4:18 AM  Result Value Ref Range Status   Specimen Description   Final    URINE, RANDOM Performed at [facility] [address] [place], [place] [address]    Special Requests   Final    NONE Performed at [facility], [place] [address] [place], [place] [address]    Culture MULTIPLE SPECIES PRESENT, SUGGEST RECOLLECTION (A)  Final   Report Status 10/24/2017 FINAL  Final  Culture, body fluid-bottle     Status: None (Preliminary result)   Collection Time: 10/24/17  9:35 AM  Result Value Ref Range Status   Specimen Description PLEURAL LEFT  Final   Special Requests NONE  Final   Culture   Final    NO GROWTH < 24 HOURS Performed at [facility], [place] [address], [place], [place] [address]    Report Status PENDING  Incomplete  Gram stain     Status: None   Collection Time: 10/24/17  9:35 AM  Result Value Ref Range Status   Specimen Description PLEURAL LEFT  Final   Special Requests NONE  Final   Gram Stain   Final    FEW WBC PRESENT, PREDOMINANTLY MONONUCLEAR NO ORGANISMS SEEN Performed at [facility], [address], [place], [place] [address]    Report Status 10/24/2017 FINAL  Final      Radiology Studies: US Abdomen Limited  Result Date: 10/25/2017 CLINICAL DATA:  Evaluate for intra-abdominal ascites and perform ultrasound-guided paracentesis as indicated. EXAM: LIMITED ABDOMEN ULTRASOUND FOR ASCITES TECHNIQUE: Limited ultrasound survey for ascites was performed in all four abdominal quadrants. COMPARISON:  Abdominal CT - 10/23/2017 FINDINGS: Sonographic evaluation of the abdomen demonstrates a trace amount of intra-abdominal ascites, too small to allow for safe ultrasound-guided paracentesis. IMPRESSION: Trace amount of intra-abdominal ascites, too small to allow for safe ultrasound  guided paracentesis. No paracentesis attempted. Electronically Signed   By: Sandi Mariscal M.D.   On: 10/25/2017 16:51     Scheduled Meds: . aspirin EC  81 mg Oral Daily  . atorvastatin  80 mg Oral Daily  . carvedilol  12.5 mg Oral BID WC  . [START ON 10/27/2017] citalopram  20 mg Oral Daily  . enoxaparin (LOVENOX) injection  40 mg Subcutaneous Q24H  . lactose free nutrition  237 mL Oral TID WC  . lubiprostone  8 mcg Oral BID WC  . mirtazapine  15 mg Oral QHS  . mometasone-formoterol  2 puff Inhalation BID  . polyethylene glycol  17 g Oral Daily   Continuous Infusions:    LOS: 3 days   Time Spent in minutes   30 minutes   Shloimy Michalski D.O. on 10/26/2017 at 11:16 AM  Between 7am to 7pm - Pager - [phone]  After 7pm go to [email or web address] - password [email or web address]  And look for the night coverage person covering for me after hours  [facility] Office  [phone]

## 2017-10-26 NOTE — Progress Notes (Signed)
Jo Parker   DOB:Jan 20, 1946   BW#:[id number]    Assessment & Plan:  Peritoneal carcinomatosis She had received 1 dose of chemotherapy. Continue supportive care  Acquired pancytopenia Due to chemotherapy She has received 1 unit of blood transfusion on 11/02/2017 and 1 dose of G-CSF support Her white count has responded very well She does not need further blood transfusion  Vitamin B12 deficiency She has received vitamin B12 injection yesterday  Malignant cachexia I will start her on Remeron She is motivated to increase oral intake as tolerated  Recent sepsis/fever She has been afebrile since broad-spectrum IV antibiotics I recommend holding off further antibiotics unless she had recurrent fever  Recurrent pleural effusion and ascites Secondary to malignancy She has therapeutic relief after her thoracentesis  Cytology is malignant but it does not surprise me.  It does not change management  Profound weakness Secondary to malignancy, severe anemia and electrolyte imbalance Recommend PT evaluation for safety to determine whether she can go home with home PT versus skilled nursing facility placement I encouraged her to walk today if possible  Protein calorie malnutrition and electrolyte imbalance I started her on Remeron Replacing electrolytes as needed  Discharge planning She is gradually improving.  I am hopeful she can be discharged within the next 24 to 48 hours I will continue to follow  [name], MD 10/26/2017  7:59 AM   Subjective:  She felt a bit stronger since yesterday.  She tolerated blood transfusion well.  She has been afebrile.  She was not able to get up out of bed yet.  Oxygen saturation is excellent.  She denies cough.  She is constipated for 3 days.  She denies abdominal pain or nausea  Objective:  Vitals:   10/25/17 2107 10/26/17 0540  BP: (!) 169/89 (!) 181/92  Pulse: 89 100  Resp: 17 18  Temp: 99.2 F (37.3 C) 98.5 F (36.9 C)   SpO2: 92% 96%     Intake/Output Summary (Last 24 hours) at 10/26/2017 0759 Last data filed at 10/25/2017 1450 Gross per 24 hour  Intake 360 ml  Output 900 ml  Net -540 ml    GENERAL:alert, no distress and comfortable SKIN: skin color, texture, turgor are normal, no rashes or significant lesions EYES: normal, Conjunctiva are pink and non-injected, sclera clear OROPHARYNX:no exudate, no erythema and lips, buccal mucosa, and tongue normal  NECK: supple, thyroid normal size, non-tender, without nodularity LYMPH:  no palpable lymphadenopathy in the cervical, axillary or inguinal LUNGS: clear to auscultation and percussion with normal breathing effort HEART: regular rate & rhythm and no murmurs and no lower extremity edema ABDOMEN:abdomen soft, non-tender and normal bowel sounds Musculoskeletal:no cyanosis of digits and no clubbing  NEURO: alert & oriented x 3 with fluent speech, no focal motor/sensory deficits   Labs:  Lab Results  Component Value Date   WBC 3.8 (L) 10/26/2017   HGB 10.2 (L) 10/26/2017   HCT 30.6 (L) 10/26/2017   MCV 61.7 (L) 10/26/2017   PLT 383 10/26/2017   NEUTROABS 2.0 10/26/2017    Lab Results  Component Value Date   NA 135 10/26/2017   K 4.4 10/26/2017   CL 100 (L) 10/26/2017   CO2 25 10/26/2017    Studies:  Dg Chest 1 View  Result Date: 10/24/2017 CLINICAL DATA:  Post left thoracentesis.  Peritoneal carcinomatosis. EXAM: CHEST  1 VIEW COMPARISON:  10/23/2017 FINDINGS: Right IJ Port-A-Cath unchanged with tip over the SVC. Metallic device over the left chest unchanged. Lungs are  adequately inflated with significant improvement in the left-sided pleural effusion post thoracentesis. No evidence pneumothorax. There is a small residual amount of left pleural fluid likely with associated basilar atelectasis. Remainder of the exam is unchanged. IMPRESSION: Improved left pleural effusion post thoracentesis. Small amount of residual left pleural fluid/basilar  atelectasis. No pneumothorax. Electronically Signed   By: Marin Olp M.D.   On: 10/24/2017 10:16   US Abdomen Limited  Result Date: 10/25/2017 CLINICAL DATA:  Evaluate for intra-abdominal ascites and perform ultrasound-guided paracentesis as indicated. EXAM: LIMITED ABDOMEN ULTRASOUND FOR ASCITES TECHNIQUE: Limited ultrasound survey for ascites was performed in all four abdominal quadrants. COMPARISON:  Abdominal CT - 10/23/2017 FINDINGS: Sonographic evaluation of the abdomen demonstrates a trace amount of intra-abdominal ascites, too small to allow for safe ultrasound-guided paracentesis. IMPRESSION: Trace amount of intra-abdominal ascites, too small to allow for safe ultrasound guided paracentesis. No paracentesis attempted. Electronically Signed   By: Sandi Mariscal M.D.   On: 10/25/2017 16:51

## 2017-10-27 ENCOUNTER — Telehealth: Payer: Self-pay | Admitting: Hematology and Oncology

## 2017-10-27 ENCOUNTER — Other Ambulatory Visit: Payer: Self-pay | Admitting: Hematology and Oncology

## 2017-10-27 DIAGNOSIS — K5909 Other constipation: Secondary | ICD-10-CM

## 2017-10-27 DIAGNOSIS — Z9889 Other specified postprocedural states: Secondary | ICD-10-CM

## 2017-10-27 DIAGNOSIS — D649 Anemia, unspecified: Secondary | ICD-10-CM

## 2017-10-27 DIAGNOSIS — D539 Nutritional anemia, unspecified: Secondary | ICD-10-CM

## 2017-10-27 LAB — CBC WITH DIFFERENTIAL/PLATELET
Basophils Absolute: 0 10*3/uL (ref 0.0–0.1)
Basophils Relative: 1 %
EOS ABS: 0.3 10*3/uL (ref 0.0–0.7)
Eosinophils Relative: 7 %
HCT: 29.4 % — ABNORMAL LOW (ref 36.0–46.0)
Hemoglobin: 9.4 g/dL — ABNORMAL LOW (ref 12.0–15.0)
LYMPHS ABS: 2 10*3/uL (ref 0.7–4.0)
Lymphocytes Relative: 50 %
MCH: 20.3 pg — ABNORMAL LOW (ref 26.0–34.0)
MCHC: 32 g/dL (ref 30.0–36.0)
MCV: 63.5 fL — ABNORMAL LOW (ref 78.0–100.0)
MONO ABS: 0.8 10*3/uL (ref 0.1–1.0)
Monocytes Relative: 19 %
Neutro Abs: 0.9 10*3/uL — ABNORMAL LOW (ref 1.7–7.7)
Neutrophils Relative %: 23 %
PLATELETS: 337 10*3/uL (ref 150–400)
RBC: 4.63 MIL/uL (ref 3.87–5.11)
RDW: 21.8 % — AB (ref 11.5–15.5)
WBC: 4 10*3/uL (ref 4.0–10.5)

## 2017-10-27 LAB — BASIC METABOLIC PANEL
ANION GAP: 9 (ref 5–15)
BUN: 9 mg/dL (ref 6–20)
CALCIUM: 8.5 mg/dL — AB (ref 8.9–10.3)
CO2: 24 mmol/L (ref 22–32)
Chloride: 103 mmol/L (ref 101–111)
Creatinine, Ser: 0.51 mg/dL (ref 0.44–1.00)
GFR calc Af Amer: >60 mL/min (ref >60)
Glucose, Bld: 90 mg/dL (ref 65–99)
Potassium: 3.9 mmol/L (ref 3.5–5.1)
SODIUM: 136 mmol/L (ref 135–145)

## 2017-10-27 LAB — MAGNESIUM: MAGNESIUM: 1.3 mg/dL — AB (ref 1.7–2.4)

## 2017-10-27 MED ORDER — POLYETHYLENE GLYCOL 3350 17 G PO PACK: 17.0000 g | PACK | Freq: Every day | ORAL | 0 refills | Status: DC

## 2017-10-27 MED ORDER — HYDROCODONE-ACETAMINOPHEN 10-325 MG PO TABS: 1.0000 | ORAL_TABLET | ORAL | 0 refills | Status: DC | PRN

## 2017-10-27 MED ORDER — MAGNESIUM SULFATE 4 GM/100ML IV SOLN
4.0000 g | Freq: Once | INTRAVENOUS | Status: AC
  Administered 2017-10-27: 4 g via INTRAVENOUS
  Filled 2017-10-27: qty 100

## 2017-10-27 MED ORDER — HEPARIN SOD (PORK) LOCK FLUSH 100 UNIT/ML IV SOLN
500.0000 [IU] | INTRAVENOUS | Status: AC | PRN
  Administered 2017-10-27: 500 [IU]

## 2017-10-27 MED ORDER — MAGNESIUM SULFATE 4 GM/100ML IV SOLN
4.0000 g | Freq: Once | INTRAVENOUS | Status: DC
  Filled 2017-10-27: qty 100

## 2017-10-27 MED ORDER — TBO-FILGRASTIM 300 MCG/0.5ML ~~LOC~~ SOSY
300.0000 ug | PREFILLED_SYRINGE | Freq: Once | SUBCUTANEOUS | Status: AC
  Administered 2017-10-27: 300 ug via SUBCUTANEOUS
  Filled 2017-10-27: qty 0.5

## 2017-10-27 MED ORDER — MIRTAZAPINE 15 MG PO TABS: 15.0000 mg | ORAL_TABLET | Freq: Every day | ORAL | 0 refills | Status: DC

## 2017-10-27 MED ORDER — CITALOPRAM HYDROBROMIDE 20 MG PO TABS: 20.0000 mg | ORAL_TABLET | Freq: Every day | ORAL | 0 refills | Status: DC

## 2017-10-27 MED ORDER — LUBIPROSTONE 8 MCG PO CAPS: 8.0000 ug | ORAL_CAPSULE | Freq: Two times a day (BID) | ORAL | 0 refills | Status: DC

## 2017-10-27 MED FILL — MIRTAZAPINE 15 MG TABLET: 15 | 30 days supply | Qty: 30 | Fill #0

## 2017-10-27 MED FILL — HYDROCODON-APAP 10-325: 10-325 | 2 days supply | Qty: 14 | Fill #0

## 2017-10-27 MED FILL — CITALOPRAM HBR 20 MG TABLET: 20 | 30 days supply | Qty: 30 | Fill #0

## 2017-10-27 NOTE — Telephone Encounter (Signed)
Left message for patient regarding upcoming may appointments per 5/22 sch message

## 2017-10-27 NOTE — Care Management Note (Signed)
Case Management Note  Patient Details  Name: Chenika Nevils MRN: [id number] Date of Birth: 01-Nov-1945  Subjective/Objective:                  hhc rn  Action/Plan: [facility] will do services  Expected Discharge Date:  10/27/17               Expected Discharge Plan:  [place]  In-House Referral:     Discharge planning Services  CM Consult  Post Acute Care Choice:  Home Health Choice offered to:  Patient  DME Arranged:    DME Agency:     HH Arranged:  RN [place] Agency:  [facility]  Status of Service:  Completed, signed off  If discussed at [place] of Stay Meetings, dates discussed:    Additional Comments:  Leeroy Cha, RN 10/27/2017, 10:06 AM

## 2017-10-27 NOTE — Discharge Summary (Signed)
Physician Discharge Summary  Caylan Chenard ZJQ:[id number] DOB: 05/02/1946 DOA: 10/22/2017  PCP: Patient, [name] Per  Admit date: 10/22/2017 Discharge date: 10/27/2017  Admitted From: Home Disposition:  Home  Recommendations for Outpatient Follow-up:  1. Follow up with PCP in 1 week with repeat CBC/BMP/Magnesium 2. Follow-up with Dr. 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  She was admitted with possible sepsis and started on broad-spectrum antibiotics.  There was no evidence of any infection and eventually sepsis was ruled out and antibiotics were discontinued.  She underwent thoracentesis for pleural effusion.  There was no significant ascites so paracentesis could not be performed.  Oncology evaluated the patient.  Patient is currently stable and oncology has cleared the patient for discharge.  Outpatient follow-up with primary care provider and oncology.  Discharge Diagnoses:  Principal Problem:   Sepsis ([place]) Active Problems:   Primary peritoneal carcinomatosis ([name])   Ascites   Deficiency anemia   Carcinomatosis ([name])   COPD (chronic obstructive pulmonary disease) ([name])   HLD (hyperlipidemia)   Malnutrition of moderate degree   Malignant cachexia ([name])   Hypomagnesemia   Absolute anemia  Bilateral pleural effusions status post left-sided thoracentesis -Patient had left-sided thoracentesis and removal of 580 cc on 10/24/2017.  Pleural fluid cultures negative so far -CTA chest was negative for PE. -Respiratory status is stable  currently  Sepsis has been ruled out -Patient was empirically started on broad-spectrum antibiotics on admission.  Blood cultures have been negative so far.  CT chest ruled out any pulmonary infection.  Broad-spectrum antibiotics have already been discontinued.  Monitor off antibiotics  Peritoneal carcinomatosis with ascites -Status post recent chemotherapy  -paracentesis could not be performed as patient did not have enough fluid on ultrasound -Outpatient follow-up with Dr. Gorsuch/oncology, oncology has cleared the patient for discharge.  Chronic microcytic anemia -Hemoglobin stable.  Outpatient follow-up  Leukopenia with neutropenia -Improved.  Outpatient follow-up  COPD -Stable.  Continue Symbicort  Essential hypertension -Blood pressure stable.  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  Outpatient follow-up and might need vascular surgery as an outpatient  Hypomagnesemia -Replaced.  Outpatient follow-up  Hypokalemia -Resolved  Depression -Continue Celexa at the lower dose  Chronic constipation -Continue lubiprostone and MiraLAX  Generalized deconditioning -Patient will need home health on discharge  Moderate malnutrition -Continue Remeron to improve her appetite       Discharge Instructions  Discharge Instructions    Call MD for:  difficulty breathing, headache or visual disturbances   Complete by:  As directed    Call MD for:  extreme fatigue   Complete by:  As directed    Call MD for:  hives   Complete by:  As directed    Call MD for:  persistant dizziness or light-headedness   Complete by:  As directed    Call MD for:  persistant nausea and vomiting   Complete by:  As directed    Call MD for:  severe uncontrolled pain   Complete by:  As directed    Call MD for:  temperature >100.4  Complete by:  As directed    Diet general   Complete  by:  As directed    Increase activity slowly   Complete by:  As directed      Allergies as of 10/27/2017   No Known Allergies     Medication List    TAKE these medications   aspirin EC 81 MG tablet Take 81 mg by mouth daily.   atorvastatin 80 MG tablet Commonly known as:  LIPITOR Take 80 mg by mouth daily.   budesonide-formoterol 160-4.5 MCG/ACT inhaler Commonly known as:  SYMBICORT Inhale 2 puffs into the lungs 2 (two) times daily. Use two puffs by mouth twice daily(rinse mouth after use)   carvedilol 12.5 MG tablet Commonly known as:  COREG Take 12.5 mg by mouth 2 (two) times daily with a meal.   citalopram 20 MG tablet Commonly known as:  CELEXA Take 1 tablet (20 mg total) by mouth daily. Start taking on:  10/28/2017 What changed:    medication strength  how much to take   dexamethasone 4 MG tablet Commonly known as:  DECADRON Take 5 tabs the night before and 5 tabs the morning of chemotherapy, every 3 weeks with food   HYDROcodone-acetaminophen 10-325 MG tablet Commonly known as:  NORCO Take 1 tablet by mouth every 3 (three) hours as needed for moderate pain or severe pain.   lidocaine-prilocaine cream Commonly known as:  EMLA Apply to affected area once   lubiprostone 8 MCG capsule Commonly known as:  AMITIZA Take 1 capsule (8 mcg total) by mouth 2 (two) times daily with a meal. What changed:    when to take this  reasons to take this   meclizine 25 MG tablet Commonly known as:  ANTIVERT Take 25 mg by mouth 3 (three) times daily as needed for dizziness.   Melatonin 5 MG Caps Take 2 capsules by mouth at bedtime as needed (sleep). Take 2 capsules at bedtime as needed   mirtazapine 15 MG tablet Commonly known as:  REMERON Take 1 tablet (15 mg total) by mouth at bedtime.   ondansetron 4 MG tablet Commonly known as:  ZOFRAN Take 4 mg by mouth every 6 (six) hours as needed for nausea or vomiting.   polyethylene glycol packet Commonly known as:   MIRALAX / GLYCOLAX Take 17 g by mouth daily. What changed:    when to take this  reasons to take this   prochlorperazine 10 MG tablet Commonly known as:  COMPAZINE Take 1 tablet (10 mg total) by mouth every 6 (six) hours as needed (Nausea or vomiting).       No Known Allergies  Consultations:  Oncology/IR   Procedures/Studies: Dg Chest 1 View  Result Date: 10/24/2017 CLINICAL DATA:  Post left thoracentesis.  Peritoneal carcinomatosis. EXAM: CHEST  1 VIEW COMPARISON:  10/23/2017 FINDINGS: Right IJ Port-A-Cath unchanged with tip over the SVC. Metallic device over the left chest unchanged. Lungs are adequately inflated with significant improvement in the left-sided pleural effusion post thoracentesis. No evidence pneumothorax. There is a small residual amount of left pleural fluid likely with associated basilar atelectasis. Remainder of the exam is unchanged. IMPRESSION: Improved left pleural effusion post thoracentesis. Small amount of residual left pleural fluid/basilar atelectasis. No pneumothorax. Electronically Signed   By: Marin Olp M.D.   On: 10/24/2017 10:16   Dg Chest 2 View  Result Date: 10/23/2017 CLINICAL DATA:  Fever to 100.9 with chills. EXAM: CHEST - 2 VIEW COMPARISON:  09/03/2017 chest CT FINDINGS:  The left hemidiaphragm and left heart border are obscured by and adjacent small to moderate left pleural effusion. There is probable adjacent left basilar atelectasis secondary to the effusion. Port catheter tip terminates in the distal SVC. Loop monitoring device projects over the cardiac silhouette. There is mild aortic atherosclerosis without aneurysm. Degenerative changes are seen about both shoulders. No aggressive osseous lesions. IMPRESSION: Small to moderate left pleural effusion with atelectasis. Mild aortic atherosclerosis. No acute pneumonic consolidation. Electronically Signed   By: Ashley Royalty M.D.   On: 10/23/2017 01:01   Ct Angio Chest Pe W And/or Wo  Contrast  Result Date: 10/23/2017 CLINICAL DATA:  Fever and chills. Undergoing chemotherapy for primary peritoneal carcinomatosis. EXAM: CT ANGIOGRAPHY CHEST CT ABDOMEN AND PELVIS WITH CONTRAST TECHNIQUE: Multidetector CT imaging of the chest was performed using the standard protocol during bolus administration of intravenous contrast. Multiplanar CT image reconstructions and MIPs were obtained to evaluate the vascular anatomy. Multidetector CT imaging of the abdomen and pelvis was performed using the standard protocol during bolus administration of intravenous contrast. CONTRAST:  1101mL ISOVUE-370 IOPAMIDOL (ISOVUE-370) INJECTION 76% COMPARISON:  Chest CT 09/03/2017 FINDINGS: CTA CHEST FINDINGS Cardiovascular: Right IJ central venous catheter with tip over the cavoatrial junction. Metallic device over the left anterior chest wall. Mild cardiomegaly. Mild calcified plaque over the left main coronary artery and right coronary arteries. Mild calcified plaque over the thoracic aorta. Thoracic aorta is normal in caliber without aneurysm or dissection. Pulmonary arterial system is well opacified without evidence of emboli. Mediastinum/Nodes: No significant hilar or mediastinal adenopathy. Remaining mediastinal structures are unremarkable. Subcentimeter right pericardiophrenic lymph node. Lungs/Pleura: There is minimal paraseptal emphysema over the upper lungs. There is a new moderate size left pleural effusion and new small to moderate right pleural effusion with associated compressive atelectasis in the lung bases left greater than right. Airways are within normal. Musculoskeletal: Minimal degenerate change of the spine. Review of the MIP images confirms the above findings. CT ABDOMEN and PELVIS FINDINGS Hepatobiliary: Liver and biliary tree are within normal. Gallbladder unremarkable. Pancreas: Normal. Spleen: Normal. Adrenals/Urinary Tract: Adrenal glands are normal. Kidneys are normal in size without hydronephrosis  or nephrolithiasis. There is a 2.1 cm cyst over the left lower pole. Bladder is unremarkable. Stomach/Bowel: Stomach and small bowel are unremarkable. Appendix is not well visualized. Colon is normal. Vascular/Lymphatic: Aneurysmal dilatation of the suprarenal abdominal aorta measuring 3.9 cm in AP diameter with appearance suggesting this is a pseudoaneurysm. Mild to moderate calcified plaque throughout the abdominal aorta and iliac arteries. Few small gastrohepatic and periaortic lymph nodes are present. Reproductive: Previous hysterectomy. Adnexal regions are unremarkable. Other: Moderate stable ascites. Extensive known anterior peritoneal carcinomatosis over the anterior mid to lower peritoneal cavity and pelvis. Musculoskeletal: Degenerate change of the spine and hips. Review of the MIP images confirms the above findings. IMPRESSION: No evidence of pulmonary embolism. New moderate size left effusion and new small to moderate right effusion with associated bibasilar atelectasis. Known extensive peritoneal carcinomatosis and moderate ascites. 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 Recommend correlation with prior exams and vascular surgery consultation. 2.1 cm left renal cyst. Mild cardiomegaly and minimal atherosclerotic coronary artery disease. Aortic Atherosclerosis (ICD10-I70.0) and Emphysema (ICD10-J43.9). Electronically Signed   By: Marin Olp M.D.   On: 10/23/2017 07:46   Ct Abdomen Pelvis W Contrast  Result Date: 10/23/2017 CLINICAL DATA:  Fever and chills. Undergoing chemotherapy for primary peritoneal carcinomatosis. EXAM: CT ANGIOGRAPHY CHEST CT ABDOMEN AND PELVIS WITH CONTRAST TECHNIQUE: Multidetector  CT imaging of the chest was performed using the standard protocol during bolus administration of intravenous contrast. Multiplanar CT image reconstructions and MIPs were obtained to evaluate the vascular anatomy. Multidetector CT imaging of the abdomen and pelvis was performed  using the standard protocol during bolus administration of intravenous contrast. CONTRAST:  170mL ISOVUE-370 IOPAMIDOL (ISOVUE-370) INJECTION 76% COMPARISON:  Chest CT 09/03/2017 FINDINGS: CTA CHEST FINDINGS Cardiovascular: Right IJ central venous catheter with tip over the cavoatrial junction. Metallic device over the left anterior chest wall. Mild cardiomegaly. Mild calcified plaque over the left main coronary artery and right coronary arteries. Mild calcified plaque over the thoracic aorta. Thoracic aorta is normal in caliber without aneurysm or dissection. Pulmonary arterial system is well opacified without evidence of emboli. Mediastinum/Nodes: No significant hilar or mediastinal adenopathy. Remaining mediastinal structures are unremarkable. Subcentimeter right pericardiophrenic lymph node. Lungs/Pleura: There is minimal paraseptal emphysema over the upper lungs. There is a new moderate size left pleural effusion and new small to moderate right pleural effusion with associated compressive atelectasis in the lung bases left greater than right. Airways are within normal. Musculoskeletal: Minimal degenerate change of the spine. Review of the MIP images confirms the above findings. CT ABDOMEN and PELVIS FINDINGS Hepatobiliary: Liver and biliary tree are within normal. Gallbladder unremarkable. Pancreas: Normal. Spleen: Normal. Adrenals/Urinary Tract: Adrenal glands are normal. Kidneys are normal in size without hydronephrosis or nephrolithiasis. There is a 2.1 cm cyst over the left lower pole. Bladder is unremarkable. Stomach/Bowel: Stomach and small bowel are unremarkable. Appendix is not well visualized. Colon is normal. Vascular/Lymphatic: Aneurysmal dilatation of the suprarenal abdominal aorta measuring 3.9 cm in AP diameter with appearance suggesting this is a pseudoaneurysm. Mild to moderate calcified plaque throughout the abdominal aorta and iliac arteries. Few small gastrohepatic and periaortic lymph nodes  are present. Reproductive: Previous hysterectomy. Adnexal regions are unremarkable. Other: Moderate stable ascites. Extensive known anterior peritoneal carcinomatosis over the anterior mid to lower peritoneal cavity and pelvis. Musculoskeletal: Degenerate change of the spine and hips. Review of the MIP images confirms the above findings. IMPRESSION: No evidence of pulmonary embolism. New moderate size left effusion and new small to moderate right effusion with associated bibasilar atelectasis. Known extensive peritoneal carcinomatosis and moderate ascites. 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 Recommend correlation with prior exams and vascular surgery consultation. 2.1 cm left renal cyst. Mild cardiomegaly and minimal atherosclerotic coronary artery disease. Aortic Atherosclerosis (ICD10-I70.0) and Emphysema (ICD10-J43.9). Electronically Signed   By: Marin Olp M.D.   On: 10/23/2017 07:46   US Abdomen Limited  Result Date: 10/25/2017 CLINICAL DATA:  Evaluate for intra-abdominal ascites and perform ultrasound-guided paracentesis as indicated. EXAM: LIMITED ABDOMEN ULTRASOUND FOR ASCITES TECHNIQUE: Limited ultrasound survey for ascites was performed in all four abdominal quadrants. COMPARISON:  Abdominal CT - 10/23/2017 FINDINGS: Sonographic evaluation of the abdomen demonstrates a trace amount of intra-abdominal ascites, too small to allow for safe ultrasound-guided paracentesis. IMPRESSION: Trace amount of intra-abdominal ascites, too small to allow for safe ultrasound guided paracentesis. No paracentesis attempted. Electronically Signed   By: Sandi Mariscal M.D.   On: 10/25/2017 16:51   Ir US Guide Vasc Access Right  Result Date: 10/14/2017 INDICATION: 72 year old female with peritoneal carcinomatosis. She presents for durable port catheter access for chemotherapy. EXAM: IMPLANTED PORT A CATH PLACEMENT WITH ULTRASOUND AND FLUOROSCOPIC GUIDANCE MEDICATIONS: 2 g Ancef; The antibiotic was  administered within an appropriate time interval prior to skin puncture. ANESTHESIA/SEDATION: Versed 2 mg IV; Fentanyl 100 mcg IV; Moderate  Sedation Time:  20 minutes The patient was continuously monitored during the procedure by the interventional radiology nurse under my direct supervision. FLUOROSCOPY TIME:  0 minutes, 12 seconds (1 mGy) COMPLICATIONS: None immediate. PROCEDURE: The right neck and chest was prepped with chlorhexidine, and draped in the usual sterile fashion using maximum barrier technique (cap and mask, sterile gown, sterile gloves, large sterile sheet, hand hygiene and cutaneous antiseptic). Antibiotic prophylaxis was provided with 2g Ancef administered IV one hour prior to skin incision. Local anesthesia was attained by infiltration with 1% lidocaine with epinephrine. Ultrasound demonstrated patency of the right internal jugular vein, and this was documented with an image. Under real-time ultrasound guidance, this vein was accessed with a 21 gauge micropuncture needle and image documentation was performed. A small dermatotomy was made at the access site with an 11 scalpel. A 0.018" wire was advanced into the SVC and the access needle exchanged for a 66F micropuncture vascular sheath. The 0.018" wire was then removed and a 0.035" wire advanced into the IVC. An appropriate location for the subcutaneous reservoir was selected below the clavicle and an incision was made through the skin and underlying soft tissues. The subcutaneous tissues were then dissected using a combination of blunt and sharp surgical technique and a pocket was formed. A single lumen power injectable portacatheter was then tunneled through the subcutaneous tissues from the pocket to the dermatotomy and the port reservoir placed within the subcutaneous pocket. The venous access site was then serially dilated and a peel away vascular sheath placed over the wire. The wire was removed and the port catheter advanced into position  under fluoroscopic guidance. The catheter tip is positioned in the upper right atrium. This was documented with a spot image. The portacatheter was then tested and found to flush and aspirate well. The port was flushed with saline followed by 100 units/mL heparinized saline. The pocket was then closed in two layers using first subdermal inverted interrupted absorbable sutures followed by a running subcuticular suture. The epidermis was then sealed with Dermabond. The dermatotomy at the venous access site was also closed with a single inverted subdermal suture and the epidermis sealed with Dermabond. IMPRESSION: Successful placement of a right IJ approach Power Port with ultrasound and fluoroscopic guidance. The catheter is ready for use. Signed, Criselda Peaches, MD [facility] Radiology Electronically Signed   By: Jacqulynn Cadet M.D.   On: 10/14/2017 17:15   Ct Biopsy  Result Date: 10/08/2017 CLINICAL DATA:  Ascites, omental and peritoneal carcinomatosis and suspicion metastatic ovarian carcinoma. Core biopsy of omental tumor has been requested for tissue diagnosis. EXAM: CT GUIDED CORE BIOPSY OF OMENTAL/PERITONEAL TUMOR ANESTHESIA/SEDATION: 1.0 mg IV Versed; 50 mcg IV Fentanyl Total Moderate Sedation Time:  18 minutes. The patient's level of consciousness and physiologic status were continuously monitored during the procedure by Radiology nursing. PROCEDURE: The procedure risks, benefits, and alternatives were explained to the patient. Questions regarding the procedure were encouraged and answered. The patient understands and consents to the procedure. A time-out was performed prior to initiating the procedure. CT was performed in a supine position through the abdomen and pelvis. The lower abdominal wall was prepped with chlorhexidine in a sterile fashion, and a sterile drape was applied covering the operative field. A sterile gown and sterile gloves were  used for the procedure. Local anesthesia was provided with 1% Lidocaine. A 17 gauge needle was advanced in the midline lower abdominal wall at the level of the  pelvis to the level omental tumor. Coaxial 18 gauge core biopsy samples were obtained. Three separate core biopsy samples were obtained and submitted in formalin. Additional CT images were performed after outer needle removal. COMPLICATIONS: None FINDINGS: Extensive omental and peritoneal tumor is present anteriorly deep to the abdominal wall. From a midline approach, tumor was sampled just to the left of midline in the upper pelvis, below the level of the umbilicus. Solid tissue was obtained. IMPRESSION: CT-guided core biopsy performed of omental/peritoneal tumor in the anterior peritoneal cavity. Electronically Signed   By: Aletta Edouard M.D.   On: 10/08/2017 11:25   Ir Fluoro Guide Port Insertion Right  Result Date: 10/14/2017 INDICATION: 72 year old female with peritoneal carcinomatosis. She presents for durable port catheter access for chemotherapy. EXAM: IMPLANTED PORT A CATH PLACEMENT WITH ULTRASOUND AND FLUOROSCOPIC GUIDANCE MEDICATIONS: 2 g Ancef; The antibiotic was administered within an appropriate time interval prior to skin puncture. ANESTHESIA/SEDATION: Versed 2 mg IV; Fentanyl 100 mcg IV; Moderate Sedation Time:  20 minutes The patient was continuously monitored during the procedure by the interventional radiology nurse under my direct supervision. FLUOROSCOPY TIME:  0 minutes, 12 seconds (1 mGy) COMPLICATIONS: None immediate. PROCEDURE: The right neck and chest was prepped with chlorhexidine, and draped in the usual sterile fashion using maximum barrier technique (cap and mask, sterile gown, sterile gloves, large sterile sheet, hand hygiene and cutaneous antiseptic). Antibiotic prophylaxis was provided with 2g Ancef administered IV one hour prior to skin incision. Local anesthesia was attained by infiltration with 1% lidocaine with  epinephrine. Ultrasound demonstrated patency of the right internal jugular vein, and this was documented with an image. Under real-time ultrasound guidance, this vein was accessed with a 21 gauge micropuncture needle and image documentation was performed. A small dermatotomy was made at the access site with an 11 scalpel. A 0.018" wire was advanced into the SVC and the access needle exchanged for a 15F micropuncture vascular sheath. The 0.018" wire was then removed and a 0.035" wire advanced into the IVC. An appropriate location for the subcutaneous reservoir was selected below the clavicle and an incision was made through the skin and underlying soft tissues. The subcutaneous tissues were then dissected using a combination of blunt and sharp surgical technique and a pocket was formed. A single lumen power injectable portacatheter was then tunneled through the subcutaneous tissues from the pocket to the dermatotomy and the port reservoir placed within the subcutaneous pocket. The venous access site was then serially dilated and a peel away vascular sheath placed over the wire. The wire was removed and the port catheter advanced into position under fluoroscopic guidance. The catheter tip is positioned in the upper right atrium. This was documented with a spot image. The portacatheter was then tested and found to flush and aspirate well. The port was flushed with saline followed by 100 units/mL heparinized saline. The pocket was then closed in two layers using first subdermal inverted interrupted absorbable sutures followed by a running subcuticular suture. The epidermis was then sealed with Dermabond. The dermatotomy at the venous access site was also closed with a single inverted subdermal suture and the epidermis sealed with Dermabond. IMPRESSION: Successful placement of a right IJ approach Power Port with ultrasound and fluoroscopic guidance. The catheter is ready for use. Signed, Criselda Peaches, MD [facility] Radiology Electronically Signed   By: Jacqulynn Cadet M.D.   On: 10/14/2017 17:15   Ir Paracentesis  Result Date: 10/14/2017 INDICATION: 72 year old  female with peritoneal carcinomatosis and symptomatic ascites EXAM: ULTRASOUND GUIDED  PARACENTESIS MEDICATIONS: None. COMPLICATIONS: None immediate. PROCEDURE: Informed written consent was obtained from the patient after a discussion of the risks, benefits and alternatives to treatment. A timeout was performed prior to the initiation of the procedure. Initial ultrasound scanning demonstrates a moderate amount of ascites within the right upper abdominal quadrant. The right upper abdomen was prepped and draped in the usual sterile fashion. 1% lidocaine with epinephrine was used for local anesthesia. Following this, a 19 gauge, 7-cm, Yueh catheter was introduced. An ultrasound image was saved for documentation purposes. The paracentesis was performed. The catheter was removed and a dressing was applied. The patient tolerated the procedure well without immediate post procedural complication. FINDINGS: A total of approximately 1300 mL of ascitic fluid was removed. IMPRESSION: Successful ultrasound-guided paracentesis yielding 1.3 liters of peritoneal fluid. Electronically Signed   By: Jacqulynn Cadet M.D.   On: 10/14/2017 17:16   US Thoracentesis Asp Pleural Space W/img Guide  Result Date: 10/26/2017 INDICATION: Sepsis, left pleural effusion EXAM: ULTRASOUND GUIDED LEFT THORACENTESIS MEDICATIONS: None. COMPLICATIONS: None immediate. PROCEDURE: An ultrasound guided thoracentesis was thoroughly discussed with the patient and questions answered. The benefits, risks, alternatives and complications were also discussed. The patient understands and wishes to proceed with the procedure. Written consent was obtained. Ultrasound was performed to localize and mark an adequate pocket of fluid in the left chest. The area was  then prepped and draped in the normal sterile fashion. 1% Lidocaine was used for local anesthesia. Under ultrasound guidance a multi sidehole catheter was introduced. Thoracentesis was performed. The catheter was removed and a dressing applied. Procedure performed by Rowe Robert PA. FINDINGS: A total of approximately 580 mL of blood tinged fluid was removed. Samples were sent to the laboratory as requested by the clinical team. IMPRESSION: Successful ultrasound guided left thoracentesis yielding 580 mL of pleural fluid. Follow-up chest radiograph shows no pneumothorax Electronically Signed   By: Lucrezia Europe M.D.   On: 10/26/2017 08:26     Subjective: Patient seen and examined at bedside.  She denies any overnight fever, nausea, vomiting.  She feels better enough to go home today.  Discharge Exam: Vitals:   10/27/17 0540 10/27/17 0953  BP: (!) 154/89   Pulse: 96   Resp: (!) 22   Temp: 98.4 F (36.9 C)   SpO2: 95% 93%   Vitals:   10/26/17 1938 10/26/17 2039 10/27/17 0540 10/27/17 0953  BP: 137/77  (!) 154/89   Pulse: 89  96   Resp: 20  (!) 22   Temp: 98.2 F (36.8 C)  98.4 F (36.9 C)   TempSrc: Oral  Oral   SpO2: 98% 97% 95% 93%  Weight:      Height:        General: Pt is alert, awake, not in acute distress Cardiovascular: Rate controlled, S1/S2 + Respiratory: Bilateral decreased breath sounds at bases Abdominal: Soft, NT, ND, bowel sounds + Extremities: no edema, no cyanosis    The results of significant diagnostics from this hospitalization (including imaging, microbiology, ancillary and laboratory) are listed below for reference.     Microbiology: Recent Results (from the past 240 hour(s))  Culture, blood (Routine x 2)     Status: None (Preliminary result)   Collection Time: 10/23/17 12:20 AM  Result Value Ref Range Status   Specimen Description   Final    BLOOD PORTA CATH Performed at [facility] [address] [place],  [place] [address]     Special Requests   Final    BOTTLES DRAWN AEROBIC ONLY Blood Culture adequate volume Performed at [facility] [address] [place], [place] [address]    Culture   Final    NO GROWTH 3 DAYS Performed at [facility], [place] [address], [place], [place] [address]    Report Status PENDING  Incomplete  Culture, blood (Routine x 2)     Status: None (Preliminary result)   Collection Time: 10/23/17  4:18 AM  Result Value Ref Range Status   Specimen Description   Final    BLOOD BLOOD RIGHT HAND Performed at [facility] [address], [place], [place] [address]    Special Requests   Final    BOTTLES DRAWN AEROBIC AND ANAEROBIC Blood Culture adequate volume Performed at [facility] [address] [place], [place] [address]    Culture   Final    NO GROWTH 3 DAYS Performed at [facility], [place] [address], [place], [place] [address]    Report Status PENDING  Incomplete  Urine culture     Status: Abnormal   Collection Time: 10/23/17  4:18 AM  Result Value Ref Range Status   Specimen Description   Final    URINE, RANDOM Performed at [facility] [address], [place], [place] [address]    Special Requests   Final    NONE Performed at [facility], [place] [address] [place], [place] [address]    Culture MULTIPLE SPECIES PRESENT, SUGGEST RECOLLECTION (A)  Final   Report Status 10/24/2017 FINAL  Final  Culture, body fluid-bottle     Status: None (Preliminary result)   Collection Time: 10/24/17  9:35 AM  Result Value Ref Range Status   Specimen Description PLEURAL LEFT  Final   Special Requests NONE  Final   Culture   Final    NO GROWTH 2 DAYS Performed at [facility] [address] [place], [place] [address]    Report Status PENDING  Incomplete  Gram stain     Status: None   Collection Time: 10/24/17  9:35 AM  Result Value Ref Range Status   Specimen  Description PLEURAL LEFT  Final   Special Requests NONE  Final   Gram Stain   Final    FEW WBC PRESENT, PREDOMINANTLY MONONUCLEAR NO ORGANISMS SEEN Performed at [facility], [address], [place],  [address]    Report Status 10/24/2017 FINAL  Final     Labs: BNP (last 3 results) No results for input(s): BNP in the last 8760 hours. Basic Metabolic Panel: Recent Labs  Lab 10/23/17 0020 10/24/17 0543 10/25/17 0430 10/26/17 0558 10/27/17 0515  NA 132* 132* 133* 135 136  K 3.5 3.2* 3.1* 4.4 3.9  CL 98* 100* 99* 100* 103  CO2 23 21* 25 25 24   GLUCOSE 140* 122* 92 88 90  BUN 17 8 7 7 9   CREATININE 0.58 0.41* 0.47 0.51 0.51  CALCIUM 8.4* 8.0* 8.3* 8.6* 8.5*  MG  --   --  1.3* 1.8 1.3*   Liver Function Tests: Recent Labs  Lab 10/23/17 0020 10/26/17 0558  AST 30 29  ALT 21 19  ALKPHOS 59 69  BILITOT 0.6 0.7  PROT 6.4* 6.3*  ALBUMIN 2.5* 2.3*   No results for input(s): LIPASE, AMYLASE in the last 168 hours. No results for input(s): AMMONIA in the  last 168 hours. CBC: Recent Labs  Lab 10/23/17 0020 10/24/17 0543 10/25/17 0430 10/26/17 0558 10/27/17 0515  WBC 6.4 3.3* 2.4* 3.8* 4.0  NEUTROABS 5.0  --  1.0* 2.0 0.9*  HGB 8.9* 7.3* 7.7* 10.2* 9.4*  HCT 27.8* 23.0* 23.1* 30.6* 29.4*  MCV 59.4* 59.4* 58.6* 61.7* 63.5*  PLT 357 244 271 383 337   Cardiac Enzymes: No results for input(s): CKTOTAL, CKMB, CKMBINDEX, TROPONINI in the last 168 hours. BNP: Invalid input(s): POCBNP CBG: No results for input(s): GLUCAP in the last 168 hours. D-Dimer No results for input(s): DDIMER in the last 72 hours. Hgb A1c No results for input(s): HGBA1C in the last 72 hours. Lipid Profile No results for input(s): CHOL, HDL, LDLCALC, TRIG, CHOLHDL, LDLDIRECT in the last 72 hours. Thyroid function studies No results for input(s): TSH, T4TOTAL, T3FREE, THYROIDAB in the last 72 hours.  Invalid input(s): FREET3 Anemia work up No results for input(s): VITAMINB12,  FOLATE, FERRITIN, TIBC, IRON, RETICCTPCT in the last 72 hours. Urinalysis    Component Value Date/Time   COLORURINE YELLOW 10/23/2017 0004   APPEARANCEUR HAZY (A) 10/23/2017 0004   LABSPEC 1.020 10/23/2017 0004   PHURINE 6.0 10/23/2017 0004   GLUCOSEU NEGATIVE 10/23/2017 0004   HGBUR NEGATIVE 10/23/2017 0004   BILIRUBINUR NEGATIVE 10/23/2017 0004   KETONESUR 5 (A) 10/23/2017 0004   PROTEINUR NEGATIVE 10/23/2017 0004   NITRITE NEGATIVE 10/23/2017 0004   LEUKOCYTESUR TRACE (A) 10/23/2017 0004   Sepsis Labs Invalid input(s): PROCALCITONIN,  WBC,  LACTICIDVEN Microbiology Recent Results (from the past 240 hour(s))  Culture, blood (Routine x 2)     Status: None (Preliminary result)   Collection Time: 10/23/17 12:20 AM  Result Value Ref Range Status   Specimen Description   Final    BLOOD PORTA CATH Performed at [facility], [place] [address], [place], [place] [address]    Special Requests   Final    BOTTLES DRAWN AEROBIC ONLY Blood Culture adequate volume Performed at [facility] [address] [place], [place] [address]    Culture   Final    NO GROWTH 3 DAYS Performed at [facility], [place] [address], [place], [place] [address]    Report Status PENDING  Incomplete  Culture, blood (Routine x 2)     Status: None (Preliminary result)   Collection Time: 10/23/17  4:18 AM  Result Value Ref Range Status   Specimen Description   Final    BLOOD BLOOD RIGHT HAND Performed at [facility] [address], [place], [place] [address]    Special Requests   Final    BOTTLES DRAWN AEROBIC AND ANAEROBIC Blood Culture adequate volume Performed at [facility] [address] [place], [place] [address]    Culture   Final    NO GROWTH 3 DAYS Performed at [facility], [place] [address], [place], [place] [address]    Report Status PENDING  Incomplete  Urine culture     Status: Abnormal   Collection  Time: 10/23/17  4:18 AM  Result Value Ref Range Status   Specimen Description   Final    URINE, RANDOM Performed at [facility] [address] [place],  [address]    Special Requests   Final    NONE Performed at [facility], [place] [address] [place],  [address]    Culture MULTIPLE SPECIES PRESENT, SUGGEST RECOLLECTION (A)  Final   Report  Status 10/24/2017 FINAL  Final  Culture, body fluid-bottle     Status: None (Preliminary result)   Collection Time: 10/24/17  9:35 AM  Result Value Ref Range Status   Specimen Description PLEURAL LEFT  Final   Special Requests NONE  Final   Culture   Final    NO GROWTH 2 DAYS Performed at [facility], [address], [place], [place] [address]    Report Status PENDING  Incomplete  Gram stain     Status: None   Collection Time: 10/24/17  9:35 AM  Result Value Ref Range Status   Specimen Description PLEURAL LEFT  Final   Special Requests NONE  Final   Gram Stain   Final    FEW WBC PRESENT, PREDOMINANTLY MONONUCLEAR NO ORGANISMS SEEN Performed at [facility], [address] [place], [place] [address]    Report Status 10/24/2017 FINAL  Final     Time coordinating discharge: 35 minutes  SIGNED:   Aline August, MD  [facility] 10/27/2017, 9:55 AM Pager: [phone]  If 7PM-7AM, please contact night-coverage [email or web address] Password [email or web address]

## 2017-10-27 NOTE — Progress Notes (Signed)
Jo Parker   DOB:Dec 23, 1945   PO#:[id number]    Assessment & Plan:   Peritoneal carcinomatosis She had received 1 dose of chemotherapy. Continue supportive care  Acquired pancytopenia Due to chemotherapy She has received 1 unit of blood transfusion on 10/25/2017 and 1 dose of G-CSF support Her white count has responded very well but will write for one more dose today She does not need further blood transfusion  Vitamin B12 deficiency She has received vitamin B12 injection on 10/25/17  Malignant cachexia I have started her on Remeron She is motivated to increase oral intake as tolerated  Recent sepsis/fever She has been afebrile since broad-spectrum IV antibiotics I recommend holding off further antibiotics unless she had recurrent fever All cultures are negative so far  Recurrent pleural effusion and ascites Secondary to malignancy She has therapeutic relief after her thoracentesis  Cytology is malignant but it does not surprise me.  It does not change management  Profound weakness Secondary to malignancy, severe anemia and electrolyte  Appreciate physical therapy assessment I will consult advanced home care for home PT  Protein calorie malnutrition and electrolyte imbalance I started her on Remeron Replacing electrolytes as needed 1 more dose of IV magnesium  Discharge planning She is gradually improving.   She can be discharged today I will set up follow-up appointment next week I have spoken with the patient's son who is her primary caregiver over the phone and he agreed with the plan of care  [name], MD 10/27/2017  7:57 AM   Subjective:  She felt better today.  She had bowel movement yesterday.  She ate better since.  Shortness of breath is stable.  She was able to ambulate with physical therapy albeit with some weakness.  There were no reported fever or chills.  Objective:  Vitals:   10/26/17 2039 10/27/17 0540  BP:  (!) 154/89  Pulse:  96   Resp:  (!) 22  Temp:  98.4 F (36.9 C)  SpO2: 97% 95%     Intake/Output Summary (Last 24 hours) at 10/27/2017 0757 Last data filed at 10/27/2017 4315 Gross per 24 hour  Intake 250 ml  Output -  Net 250 ml    GENERAL:alert, no distress and comfortable SKIN: skin color, texture, turgor are normal, no rashes or significant lesions EYES: normal, Conjunctiva are pink and non-injected, sclera clear OROPHARYNX:no exudate, no erythema and lips, buccal mucosa, and tongue normal  NECK: supple, thyroid normal size, non-tender, without nodularity LYMPH:  no palpable lymphadenopathy in the cervical, axillary or inguinal LUNGS: clear to auscultation and percussion with normal breathing effort HEART: regular rate & rhythm and no murmurs and no lower extremity edema ABDOMEN:abdomen soft, non-tender and normal bowel sounds Musculoskeletal:no cyanosis of digits and no clubbing  NEURO: alert & oriented x 3 with fluent speech, no focal motor/sensory deficits   Labs:  Lab Results  Component Value Date   WBC 4.0 10/27/2017   HGB 9.4 (L) 10/27/2017   HCT 29.4 (L) 10/27/2017   MCV 63.5 (L) 10/27/2017   PLT 337 10/27/2017   NEUTROABS 0.9 (L) 10/27/2017    Lab Results  Component Value Date   NA 136 10/27/2017   K 3.9 10/27/2017   CL 103 10/27/2017   CO2 24 10/27/2017    Studies:  US Abdomen Limited  Result Date: 10/25/2017 CLINICAL DATA:  Evaluate for intra-abdominal ascites and perform ultrasound-guided paracentesis as indicated. EXAM: LIMITED ABDOMEN ULTRASOUND FOR ASCITES TECHNIQUE: Limited ultrasound survey for ascites was performed in  all four abdominal quadrants. COMPARISON:  Abdominal CT - 10/23/2017 FINDINGS: Sonographic evaluation of the abdomen demonstrates a trace amount of intra-abdominal ascites, too small to allow for safe ultrasound-guided paracentesis. IMPRESSION: Trace amount of intra-abdominal ascites, too small to allow for safe ultrasound guided paracentesis. No paracentesis  attempted. Electronically Signed   By: Sandi Mariscal M.D.   On: 10/25/2017 16:51

## 2017-10-28 LAB — CULTURE, BLOOD (ROUTINE X 2)
Culture: NO GROWTH
Culture: NO GROWTH
SPECIAL REQUESTS: ADEQUATE
Special Requests: ADEQUATE

## 2017-10-29 LAB — CULTURE, BODY FLUID W GRAM STAIN -BOTTLE: Culture: NO GROWTH

## 2017-11-03 ENCOUNTER — Telehealth: Payer: Self-pay | Admitting: Hematology and Oncology

## 2017-11-03 ENCOUNTER — Inpatient Hospital Stay: Payer: Medicare PPO

## 2017-11-03 ENCOUNTER — Encounter: Payer: Self-pay | Admitting: Oncology

## 2017-11-03 ENCOUNTER — Inpatient Hospital Stay (HOSPITAL_BASED_OUTPATIENT_CLINIC_OR_DEPARTMENT_OTHER): Payer: Medicare PPO | Admitting: Hematology and Oncology

## 2017-11-03 ENCOUNTER — Encounter: Payer: Self-pay | Admitting: Hematology and Oncology

## 2017-11-03 DIAGNOSIS — G893 Neoplasm related pain (acute) (chronic): Secondary | ICD-10-CM

## 2017-11-03 DIAGNOSIS — R188 Other ascites: Secondary | ICD-10-CM | POA: Diagnosis not present

## 2017-11-03 DIAGNOSIS — K5909 Other constipation: Secondary | ICD-10-CM

## 2017-11-03 DIAGNOSIS — E43 Unspecified severe protein-calorie malnutrition: Secondary | ICD-10-CM

## 2017-11-03 DIAGNOSIS — D509 Iron deficiency anemia, unspecified: Secondary | ICD-10-CM

## 2017-11-03 DIAGNOSIS — Z5111 Encounter for antineoplastic chemotherapy: Secondary | ICD-10-CM | POA: Diagnosis present

## 2017-11-03 DIAGNOSIS — D539 Nutritional anemia, unspecified: Secondary | ICD-10-CM

## 2017-11-03 DIAGNOSIS — C482 Malignant neoplasm of peritoneum, unspecified: Secondary | ICD-10-CM

## 2017-11-03 DIAGNOSIS — F17210 Nicotine dependence, cigarettes, uncomplicated: Secondary | ICD-10-CM | POA: Diagnosis not present

## 2017-11-03 LAB — CBC WITH DIFFERENTIAL (CANCER CENTER ONLY)
BASOS ABS: 0.1 10*3/uL (ref 0.0–0.1)
BASOS PCT: 1 %
Eosinophils Absolute: 0.3 10*3/uL (ref 0.0–0.5)
Eosinophils Relative: 3 %
HEMATOCRIT: 35.1 % (ref 34.8–46.6)
Hemoglobin: 11.1 g/dL — ABNORMAL LOW (ref 11.6–15.9)
Lymphocytes Relative: 26 %
Lymphs Abs: 2.3 10*3/uL (ref 0.9–3.3)
MCH: 20.6 pg — ABNORMAL LOW (ref 25.1–34.0)
MCHC: 31.6 g/dL (ref 31.5–36.0)
MCV: 65.1 fL — ABNORMAL LOW (ref 79.5–101.0)
MONO ABS: 0.9 10*3/uL (ref 0.1–0.9)
Monocytes Relative: 11 %
NEUTROS ABS: 5.2 10*3/uL (ref 1.5–6.5)
NEUTROS PCT: 59 %
Platelet Count: 294 10*3/uL (ref 145–400)
RBC: 5.39 MIL/uL (ref 3.70–5.45)
RDW: 24.9 % — AB (ref 11.2–14.5)
WBC Count: 8.7 10*3/uL (ref 3.9–10.3)

## 2017-11-03 LAB — CMP (CANCER CENTER ONLY)
ALBUMIN: 3.3 g/dL — AB (ref 3.5–5.0)
ALT: 35 U/L (ref 0–55)
AST: 35 U/L — AB (ref 5–34)
Alkaline Phosphatase: 95 U/L (ref 40–150)
Anion gap: 14 — ABNORMAL HIGH (ref 3–11)
BILIRUBIN TOTAL: 0.3 mg/dL (ref 0.2–1.2)
BUN: 16 mg/dL (ref 7–26)
CO2: 25 mmol/L (ref 22–29)
Calcium: 9.8 mg/dL (ref 8.4–10.4)
Chloride: 104 mmol/L (ref 98–109)
Creatinine: 0.69 mg/dL (ref 0.60–1.10)
GFR, Est AFR Am: >60 mL/min (ref >60)
GFR, Estimated: >60 mL/min (ref >60)
GLUCOSE: 92 mg/dL (ref 70–140)
POTASSIUM: 3.8 mmol/L (ref 3.5–5.1)
SODIUM: 143 mmol/L (ref 136–145)
Total Protein: 8.2 g/dL (ref 6.4–8.3)

## 2017-11-03 LAB — IRON AND TIBC
Iron: 162 ug/dL — ABNORMAL HIGH (ref 41–142)
Saturation Ratios: 66 % — ABNORMAL HIGH (ref 21–57)
TIBC: 245 ug/dL (ref 236–444)
UIBC: 83 ug/dL

## 2017-11-03 LAB — SAMPLE TO BLOOD BANK

## 2017-11-03 LAB — FERRITIN: Ferritin: 2214 ng/mL — ABNORMAL HIGH (ref 9–269)

## 2017-11-03 MED ORDER — TBO-FILGRASTIM 300 MCG/0.5ML ~~LOC~~ SOSY
PREFILLED_SYRINGE | SUBCUTANEOUS | Status: AC
  Filled 2017-11-03: qty 0.5

## 2017-11-03 NOTE — Assessment & Plan Note (Signed)
Anemia is resolving I will hold off further IV iron or B12 injection

## 2017-11-03 NOTE — Telephone Encounter (Signed)
Gave patient AVs and calendar of upcoming June and July appointments.  °

## 2017-11-03 NOTE — Progress Notes (Signed)
[facility] OFFICE PROGRESS NOTE  Patient Care Team: Patient, [name] Per as PCP - General (General Practice)  ASSESSMENT & PLAN:  Primary peritoneal carcinomatosis ([place]) The patient is frail and had recent hospitalization Overall, she is slowly improving I recommend minimum 3 cycles of chemotherapy before repeat imaging study Clinically, I think she is responding to treatment Recent ultrasound paracentesis was canceled due to absence of ascitic fluid.  Cancer associated pain She has no further pain. She has excellent response to treatment clinically  Iron deficiency anemia Anemia is resolving I will hold off further IV iron or B12 injection  Other constipation This has resolved She will continue laxatives as needed  Severe protein-calorie malnutrition ([place]) Her nutrition is improving Despite weight loss, serum albumin is better I recommend frequent small meals   No orders of the defined types were placed in this encounter.   INTERVAL HISTORY: Please see below for problem oriented charting. 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      10/08/2017 Procedure    Extensive omental and peritoneal tumor is present anteriorly deep to the abdominal wall. From a midline approach, tumor was sampled just to the left of midline in the upper pelvis, below the level of the umbilicus. Solid tissue was obtained.  IMPRESSION: CT-guided core biopsy performed of omental/peritoneal tumor in the anterior peritoneal cavity.      10/08/2017 Pathology Results    Soft Tissue Needle Core Biopsy, Omentum/Peritoneal Cavity - POORLY DIFFERENTIATED CARCINOMA. Microscopic Comment Immunohistochemistry will be performed and reported as an addendum. Dr. [name] agrees. Called to [facility] on 10/11/17. (JDP:kh 10/11/17) ADDENDUM: Immunohistochemistry shows the tumor is positive with cytokeratin AE1/AE3, cytokeratin 7, estrogen receptor, WT-1, and p16. The tumor is negative with CD56, chromogranin, synaptophysin, CDX-2, cytokeratin 20, progesterone receptor, GATA-3, GCDFP, Napsin-A and thyroid transcription factor-1. 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 The catheter is ready for use.      10/14/2017 Procedure    A total of approximately 1300 mL of ascitic fluid was removed.      10/15/2017 Tumor Marker  Patient's tumor was tested for the following markers: CA-125 Results of the tumor marker test revealed 775.9      10/18/2017 -  Chemotherapy    The patient had  carboplatin and Taxol      10/22/2017 - 10/27/2017 Hospital Admission    She was admitted to the hospital for evaluation of shortness of breath and was found to have large pleural effusion, successfully removed and cytology was malignant.  Sepsis was ruled out.      10/23/2017 Imaging    No evidence of pulmonary embolism.  New moderate size left effusion and new small to moderate right effusion with associated bibasilar atelectasis.  Known extensive peritoneal carcinomatosis and moderate ascites.  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 Recommend correlation with prior exams and vascular surgery consultation.  2.1 cm left renal cyst.  Mild cardiomegaly and minimal atherosclerotic coronary artery disease.  Aortic Atherosclerosis (ICD10-I70.0) and Emphysema (ICD10-J43.9).      10/24/2017 Pathology Results    PLEURAL FLUID, LEFT (SPECIMEN 1 OF 1 COLLECTED 10/24/17): MALIGNANT CELLS CONSISTENT WITH METASTATIC ADENOCARCINOMA. SEE COMMENT.      10/24/2017 Procedure    Successful ultrasound guided left thoracentesis yielding 580 mL of pleural fluid.  Follow-up chest radiograph shows no pneumothorax       REVIEW OF SYSTEMS:   Constitutional: Denies fevers, chills or abnormal weight loss Eyes: Denies blurriness of vision Ears, nose, mouth, throat, and face: Denies mucositis or sore throat Respiratory: Denies cough, dyspnea or wheezes Cardiovascular: Denies palpitation, chest discomfort or lower extremity swelling Gastrointestinal:  Denies nausea, heartburn or change in bowel habits Skin: Denies abnormal skin rashes Lymphatics: Denies new lymphadenopathy or easy bruising Neurological:Denies numbness, tingling or new weaknesses Behavioral/Psych: Mood is stable, no new changes  All other systems were reviewed with the patient and are negative.  I have reviewed the past medical history, past surgical history, social history and family history with the patient and  they are unchanged from previous note.  ALLERGIES:  has No Known Allergies.  MEDICATIONS:  Current Outpatient Medications  Medication Sig Dispense Refill  . aspirin EC 81 MG tablet Take 81 mg by mouth daily.    [place] Kitchen atorvastatin (LIPITOR) 80 MG tablet Take 80 mg by mouth daily.    . budesonide-formoterol (SYMBICORT) 160-4.5 MCG/ACT inhaler Inhale 2 puffs into the lungs 2 (two) times daily. Use two puffs by mouth twice daily(rinse mouth after use)    . carvedilol (COREG) 12.5 MG tablet Take 12.5 mg by mouth 2 (two) times daily with a meal.    . citalopram (CELEXA) 20 MG tablet Take 1 tablet (20 mg total) by mouth daily. 30 tablet 0  . dexamethasone (DECADRON) 4 MG tablet Take 5 tabs the night before and 5 tabs the morning of chemotherapy, every 3 weeks with food 60 tablet 0  . HYDROcodone-acetaminophen (NORCO) 10-325 MG tablet Take 1 tablet by mouth every 3 (three) hours as needed for moderate pain or severe pain. 14 tablet 0  . lidocaine-prilocaine (EMLA) cream Apply to affected area once 30 g 3  . meclizine (ANTIVERT) 25 MG tablet Take 25 mg by mouth 3 (three) times daily as needed for dizziness.    . Melatonin 5 MG CAPS Take 2 capsules by mouth at bedtime as needed (sleep). Take 2 capsules at bedtime as needed    . mirtazapine (REMERON) 15 MG tablet Take 1 tablet (15 mg total) by mouth at bedtime. 30 tablet 0  . ondansetron (ZOFRAN) 4 MG tablet Take  4 mg by mouth every 6 (six) hours as needed for nausea or vomiting.    . polyethylene glycol (MIRALAX / GLYCOLAX) packet Take 17 g by mouth daily. 14 each 0  . prochlorperazine (COMPAZINE) 10 MG tablet Take 1 tablet (10 mg total) by mouth every 6 (six) hours as needed (Nausea or vomiting). 30 tablet 1   No current facility-administered medications for this visit.     PHYSICAL EXAMINATION: ECOG PERFORMANCE STATUS: 1 - Symptomatic but completely ambulatory  Vitals:   11/03/17 1143  BP: (!) 170/91  Pulse: 80  Resp: 18  Temp: 98.3 F (36.8 C)   SpO2: 100%   Filed Weights   11/03/17 1143  Weight: 105 lb 14.4 oz (48 kg)    GENERAL:alert, no distress and comfortable SKIN: skin color, texture, turgor are normal, no rashes or significant lesions EYES: normal, Conjunctiva are pink and non-injected, sclera clear OROPHARYNX:no exudate, no erythema and lips, buccal mucosa, and tongue normal  NECK: supple, thyroid normal size, non-tender, without nodularity LYMPH:  no palpable lymphadenopathy in the cervical, axillary or inguinal LUNGS: clear to auscultation and percussion with normal breathing effort HEART: regular rate & rhythm and no murmurs and no lower extremity edema ABDOMEN:abdomen soft, non-tender and normal bowel sounds Musculoskeletal:no cyanosis of digits and no clubbing  NEURO: alert & oriented x 3 with fluent speech, no focal motor/sensory deficits  LABORATORY DATA:  I have reviewed the data as listed    Component Value Date/Time   NA 143 11/03/2017 1056   K 3.8 11/03/2017 1056   CL 104 11/03/2017 1056   CO2 25 11/03/2017 1056   GLUCOSE 92 11/03/2017 1056   BUN 16 11/03/2017 1056   CREATININE 0.69 11/03/2017 1056   CALCIUM 9.8 11/03/2017 1056   PROT 8.2 11/03/2017 1056   ALBUMIN 3.3 (L) 11/03/2017 1056   AST 35 (H) 11/03/2017 1056   ALT 35 11/03/2017 1056   ALKPHOS 95 11/03/2017 1056   BILITOT 0.3 11/03/2017 1056   GFRNONAA >60 11/03/2017 1056   GFRAA >60 11/03/2017 1056    No results found for: SPEP, UPEP  Lab Results  Component Value Date   WBC 8.7 11/03/2017   NEUTROABS 5.2 11/03/2017   HGB 11.1 (L) 11/03/2017   HCT 35.1 11/03/2017   MCV 65.1 (L) 11/03/2017   PLT 294 11/03/2017      Chemistry      Component Value Date/Time   NA 143 11/03/2017 1056   K 3.8 11/03/2017 1056   CL 104 11/03/2017 1056   CO2 25 11/03/2017 1056   BUN 16 11/03/2017 1056   CREATININE 0.69 11/03/2017 1056      Component Value Date/Time   CALCIUM 9.8 11/03/2017 1056   ALKPHOS 95 11/03/2017 1056   AST 35 (H)  11/03/2017 1056   ALT 35 11/03/2017 1056   BILITOT 0.3 11/03/2017 1056       RADIOGRAPHIC STUDIES: I have personally reviewed the radiological images as listed and agreed with the findings in the report. Dg Chest 1 View  Result Date: 10/24/2017 CLINICAL DATA:  Post left thoracentesis.  Peritoneal carcinomatosis. EXAM: CHEST  1 VIEW COMPARISON:  10/23/2017 FINDINGS: Right IJ Port-A-Cath unchanged with tip over the SVC. Metallic device over the left chest unchanged. Lungs are adequately inflated with significant improvement in the left-sided pleural effusion post thoracentesis. No evidence pneumothorax. There is a small residual amount of left pleural fluid likely with associated basilar atelectasis. Remainder of the exam is unchanged. IMPRESSION: Improved left pleural effusion  post thoracentesis. Small amount of residual left pleural fluid/basilar atelectasis. No pneumothorax. Electronically Signed   By: Marin Olp M.D.   On: 10/24/2017 10:16   Dg Chest 2 View  Result Date: 10/23/2017 CLINICAL DATA:  Fever to 100.9 with chills. EXAM: CHEST - 2 VIEW COMPARISON:  09/03/2017 chest CT FINDINGS: The left hemidiaphragm and left heart border are obscured by and adjacent small to moderate left pleural effusion. There is probable adjacent left basilar atelectasis secondary to the effusion. Port catheter tip terminates in the distal SVC. Loop monitoring device projects over the cardiac silhouette. There is mild aortic atherosclerosis without aneurysm. Degenerative changes are seen about both shoulders. No aggressive osseous lesions. IMPRESSION: Small to moderate left pleural effusion with atelectasis. Mild aortic atherosclerosis. No acute pneumonic consolidation. Electronically Signed   By: Ashley Royalty M.D.   On: 10/23/2017 01:01   Ct Angio Chest Pe W And/or Wo Contrast  Result Date: 10/23/2017 CLINICAL DATA:  Fever and chills. Undergoing chemotherapy for primary peritoneal carcinomatosis. EXAM: CT  ANGIOGRAPHY CHEST CT ABDOMEN AND PELVIS WITH CONTRAST TECHNIQUE: Multidetector CT imaging of the chest was performed using the standard protocol during bolus administration of intravenous contrast. Multiplanar CT image reconstructions and MIPs were obtained to evaluate the vascular anatomy. Multidetector CT imaging of the abdomen and pelvis was performed using the standard protocol during bolus administration of intravenous contrast. CONTRAST:  165mL ISOVUE-370 IOPAMIDOL (ISOVUE-370) INJECTION 76% COMPARISON:  Chest CT 09/03/2017 FINDINGS: CTA CHEST FINDINGS Cardiovascular: Right IJ central venous catheter with tip over the cavoatrial junction. Metallic device over the left anterior chest wall. Mild cardiomegaly. Mild calcified plaque over the left main coronary artery and right coronary arteries. Mild calcified plaque over the thoracic aorta. Thoracic aorta is normal in caliber without aneurysm or dissection. Pulmonary arterial system is well opacified without evidence of emboli. Mediastinum/Nodes: No significant hilar or mediastinal adenopathy. Remaining mediastinal structures are unremarkable. Subcentimeter right pericardiophrenic lymph node. Lungs/Pleura: There is minimal paraseptal emphysema over the upper lungs. There is a new moderate size left pleural effusion and new small to moderate right pleural effusion with associated compressive atelectasis in the lung bases left greater than right. Airways are within normal. Musculoskeletal: Minimal degenerate change of the spine. Review of the MIP images confirms the above findings. CT ABDOMEN and PELVIS FINDINGS Hepatobiliary: Liver and biliary tree are within normal. Gallbladder unremarkable. Pancreas: Normal. Spleen: Normal. Adrenals/Urinary Tract: Adrenal glands are normal. Kidneys are normal in size without hydronephrosis or nephrolithiasis. There is a 2.1 cm cyst over the left lower pole. Bladder is unremarkable. Stomach/Bowel: Stomach and small bowel are  unremarkable. Appendix is not well visualized. Colon is normal. Vascular/Lymphatic: Aneurysmal dilatation of the suprarenal abdominal aorta measuring 3.9 cm in AP diameter with appearance suggesting this is a pseudoaneurysm. Mild to moderate calcified plaque throughout the abdominal aorta and iliac arteries. Few small gastrohepatic and periaortic lymph nodes are present. Reproductive: Previous hysterectomy. Adnexal regions are unremarkable. Other: Moderate stable ascites. Extensive known anterior peritoneal carcinomatosis over the anterior mid to lower peritoneal cavity and pelvis. Musculoskeletal: Degenerate change of the spine and hips. Review of the MIP images confirms the above findings. IMPRESSION: No evidence of pulmonary embolism. New moderate size left effusion and new small to moderate right effusion with associated bibasilar atelectasis. Known extensive peritoneal carcinomatosis and moderate ascites. 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 Recommend correlation with prior exams and vascular surgery consultation. 2.1 cm left renal cyst. Mild cardiomegaly and minimal atherosclerotic coronary artery disease.  Aortic Atherosclerosis (ICD10-I70.0) and Emphysema (ICD10-J43.9). Electronically Signed   By: Marin Olp M.D.   On: 10/23/2017 07:46   Ct Abdomen Pelvis W Contrast  Result Date: 10/23/2017 CLINICAL DATA:  Fever and chills. Undergoing chemotherapy for primary peritoneal carcinomatosis. EXAM: CT ANGIOGRAPHY CHEST CT ABDOMEN AND PELVIS WITH CONTRAST TECHNIQUE: Multidetector CT imaging of the chest was performed using the standard protocol during bolus administration of intravenous contrast. Multiplanar CT image reconstructions and MIPs were obtained to evaluate the vascular anatomy. Multidetector CT imaging of the abdomen and pelvis was performed using the standard protocol during bolus administration of intravenous contrast. CONTRAST:  186mL ISOVUE-370 IOPAMIDOL (ISOVUE-370)  INJECTION 76% COMPARISON:  Chest CT 09/03/2017 FINDINGS: CTA CHEST FINDINGS Cardiovascular: Right IJ central venous catheter with tip over the cavoatrial junction. Metallic device over the left anterior chest wall. Mild cardiomegaly. Mild calcified plaque over the left main coronary artery and right coronary arteries. Mild calcified plaque over the thoracic aorta. Thoracic aorta is normal in caliber without aneurysm or dissection. Pulmonary arterial system is well opacified without evidence of emboli. Mediastinum/Nodes: No significant hilar or mediastinal adenopathy. Remaining mediastinal structures are unremarkable. Subcentimeter right pericardiophrenic lymph node. Lungs/Pleura: There is minimal paraseptal emphysema over the upper lungs. There is a new moderate size left pleural effusion and new small to moderate right pleural effusion with associated compressive atelectasis in the lung bases left greater than right. Airways are within normal. Musculoskeletal: Minimal degenerate change of the spine. Review of the MIP images confirms the above findings. CT ABDOMEN and PELVIS FINDINGS Hepatobiliary: Liver and biliary tree are within normal. Gallbladder unremarkable. Pancreas: Normal. Spleen: Normal. Adrenals/Urinary Tract: Adrenal glands are normal. Kidneys are normal in size without hydronephrosis or nephrolithiasis. There is a 2.1 cm cyst over the left lower pole. Bladder is unremarkable. Stomach/Bowel: Stomach and small bowel are unremarkable. Appendix is not well visualized. Colon is normal. Vascular/Lymphatic: Aneurysmal dilatation of the suprarenal abdominal aorta measuring 3.9 cm in AP diameter with appearance suggesting this is a pseudoaneurysm. Mild to moderate calcified plaque throughout the abdominal aorta and iliac arteries. Few small gastrohepatic and periaortic lymph nodes are present. Reproductive: Previous hysterectomy. Adnexal regions are unremarkable. Other: Moderate stable ascites. Extensive known  anterior peritoneal carcinomatosis over the anterior mid to lower peritoneal cavity and pelvis. Musculoskeletal: Degenerate change of the spine and hips. Review of the MIP images confirms the above findings. IMPRESSION: No evidence of pulmonary embolism. New moderate size left effusion and new small to moderate right effusion with associated bibasilar atelectasis. Known extensive peritoneal carcinomatosis and moderate ascites. 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 Recommend correlation with prior exams and vascular surgery consultation. 2.1 cm left renal cyst. Mild cardiomegaly and minimal atherosclerotic coronary artery disease. Aortic Atherosclerosis (ICD10-I70.0) and Emphysema (ICD10-J43.9). Electronically Signed   By: Marin Olp M.D.   On: 10/23/2017 07:46   US Abdomen Limited  Result Date: 10/25/2017 CLINICAL DATA:  Evaluate for intra-abdominal ascites and perform ultrasound-guided paracentesis as indicated. EXAM: LIMITED ABDOMEN ULTRASOUND FOR ASCITES TECHNIQUE: Limited ultrasound survey for ascites was performed in all four abdominal quadrants. COMPARISON:  Abdominal CT - 10/23/2017 FINDINGS: Sonographic evaluation of the abdomen demonstrates a trace amount of intra-abdominal ascites, too small to allow for safe ultrasound-guided paracentesis. IMPRESSION: Trace amount of intra-abdominal ascites, too small to allow for safe ultrasound guided paracentesis. No paracentesis attempted. Electronically Signed   By: Sandi Mariscal M.D.   On: 10/25/2017 16:51   Ir US Guide Vasc Access Right  Result Date:  10/14/2017 INDICATION: 72 year old female with peritoneal carcinomatosis. She presents for durable port catheter access for chemotherapy. EXAM: IMPLANTED PORT A CATH PLACEMENT WITH ULTRASOUND AND FLUOROSCOPIC GUIDANCE MEDICATIONS: 2 g Ancef; The antibiotic was administered within an appropriate time interval prior to skin puncture. ANESTHESIA/SEDATION: Versed 2 mg IV; Fentanyl 100 mcg IV;  Moderate Sedation Time:  20 minutes The patient was continuously monitored during the procedure by the interventional radiology nurse under my direct supervision. FLUOROSCOPY TIME:  0 minutes, 12 seconds (1 mGy) COMPLICATIONS: None immediate. PROCEDURE: The right neck and chest was prepped with chlorhexidine, and draped in the usual sterile fashion using maximum barrier technique (cap and mask, sterile gown, sterile gloves, large sterile sheet, hand hygiene and cutaneous antiseptic). Antibiotic prophylaxis was provided with 2g Ancef administered IV one hour prior to skin incision. Local anesthesia was attained by infiltration with 1% lidocaine with epinephrine. Ultrasound demonstrated patency of the right internal jugular vein, and this was documented with an image. Under real-time ultrasound guidance, this vein was accessed with a 21 gauge micropuncture needle and image documentation was performed. A small dermatotomy was made at the access site with an 11 scalpel. A 0.018" wire was advanced into the SVC and the access needle exchanged for a 30F micropuncture vascular sheath. The 0.018" wire was then removed and a 0.035" wire advanced into the IVC. An appropriate location for the subcutaneous reservoir was selected below the clavicle and an incision was made through the skin and underlying soft tissues. The subcutaneous tissues were then dissected using a combination of blunt and sharp surgical technique and a pocket was formed. A single lumen power injectable portacatheter was then tunneled through the subcutaneous tissues from the pocket to the dermatotomy and the port reservoir placed within the subcutaneous pocket. The venous access site was then serially dilated and a peel away vascular sheath placed over the wire. The wire was removed and the port catheter advanced into position under fluoroscopic guidance. The catheter tip is positioned in the upper right atrium. This was documented with a spot image. The  portacatheter was then tested and found to flush and aspirate well. The port was flushed with saline followed by 100 units/mL heparinized saline. The pocket was then closed in two layers using first subdermal inverted interrupted absorbable sutures followed by a running subcuticular suture. The epidermis was then sealed with Dermabond. The dermatotomy at the venous access site was also closed with a single inverted subdermal suture and the epidermis sealed with Dermabond. IMPRESSION: Successful placement of a right IJ approach Power Port with ultrasound and fluoroscopic guidance. The catheter is ready for use. Signed, Criselda Peaches, MD [facility] Radiology Electronically Signed   By: Jacqulynn Cadet M.D.   On: 10/14/2017 17:15   Ct Biopsy  Result Date: 10/08/2017 CLINICAL DATA:  Ascites, omental and peritoneal carcinomatosis and suspicion metastatic ovarian carcinoma. Core biopsy of omental tumor has been requested for tissue diagnosis. EXAM: CT GUIDED CORE BIOPSY OF OMENTAL/PERITONEAL TUMOR ANESTHESIA/SEDATION: 1.0 mg IV Versed; 50 mcg IV Fentanyl Total Moderate Sedation Time:  18 minutes. The patient's level of consciousness and physiologic status were continuously monitored during the procedure by Radiology nursing. PROCEDURE: The procedure risks, benefits, and alternatives were explained to the patient. Questions regarding the procedure were encouraged and answered. The patient understands and consents to the procedure. A time-out was performed prior to initiating the procedure. CT was performed in a supine position through the abdomen and pelvis. The lower abdominal  wall was prepped with chlorhexidine in a sterile fashion, and a sterile drape was applied covering the operative field. A sterile gown and sterile gloves were used for the procedure. Local anesthesia was provided with 1% Lidocaine. A 17 gauge needle was advanced in the midline lower  abdominal wall at the level of the pelvis to the level omental tumor. Coaxial 18 gauge core biopsy samples were obtained. Three separate core biopsy samples were obtained and submitted in formalin. Additional CT images were performed after outer needle removal. COMPLICATIONS: None FINDINGS: Extensive omental and peritoneal tumor is present anteriorly deep to the abdominal wall. From a midline approach, tumor was sampled just to the left of midline in the upper pelvis, below the level of the umbilicus. Solid tissue was obtained. IMPRESSION: CT-guided core biopsy performed of omental/peritoneal tumor in the anterior peritoneal cavity. Electronically Signed   By: Aletta Edouard M.D.   On: 10/08/2017 11:25   Ir Fluoro Guide Port Insertion Right  Result Date: 10/14/2017 INDICATION: 72 year old female with peritoneal carcinomatosis. She presents for durable port catheter access for chemotherapy. EXAM: IMPLANTED PORT A CATH PLACEMENT WITH ULTRASOUND AND FLUOROSCOPIC GUIDANCE MEDICATIONS: 2 g Ancef; The antibiotic was administered within an appropriate time interval prior to skin puncture. ANESTHESIA/SEDATION: Versed 2 mg IV; Fentanyl 100 mcg IV; Moderate Sedation Time:  20 minutes The patient was continuously monitored during the procedure by the interventional radiology nurse under my direct supervision. FLUOROSCOPY TIME:  0 minutes, 12 seconds (1 mGy) COMPLICATIONS: None immediate. PROCEDURE: The right neck and chest was prepped with chlorhexidine, and draped in the usual sterile fashion using maximum barrier technique (cap and mask, sterile gown, sterile gloves, large sterile sheet, hand hygiene and cutaneous antiseptic). Antibiotic prophylaxis was provided with 2g Ancef administered IV one hour prior to skin incision. Local anesthesia was attained by infiltration with 1% lidocaine with epinephrine. Ultrasound demonstrated patency of the right internal jugular vein, and this was documented with an image. Under  real-time ultrasound guidance, this vein was accessed with a 21 gauge micropuncture needle and image documentation was performed. A small dermatotomy was made at the access site with an 11 scalpel. A 0.018" wire was advanced into the SVC and the access needle exchanged for a 81F micropuncture vascular sheath. The 0.018" wire was then removed and a 0.035" wire advanced into the IVC. An appropriate location for the subcutaneous reservoir was selected below the clavicle and an incision was made through the skin and underlying soft tissues. The subcutaneous tissues were then dissected using a combination of blunt and sharp surgical technique and a pocket was formed. A single lumen power injectable portacatheter was then tunneled through the subcutaneous tissues from the pocket to the dermatotomy and the port reservoir placed within the subcutaneous pocket. The venous access site was then serially dilated and a peel away vascular sheath placed over the wire. The wire was removed and the port catheter advanced into position under fluoroscopic guidance. The catheter tip is positioned in the upper right atrium. This was documented with a spot image. The portacatheter was then tested and found to flush and aspirate well. The port was flushed with saline followed by 100 units/mL heparinized saline. The pocket was then closed in two layers using first subdermal inverted interrupted absorbable sutures followed by a running subcuticular suture. The epidermis was then sealed with Dermabond. The dermatotomy at the venous access site was also closed with a single inverted subdermal suture and the epidermis sealed with Dermabond. IMPRESSION: Successful placement  of a right IJ approach Power Port with ultrasound and fluoroscopic guidance. The catheter is ready for use. Signed, Criselda Peaches, MD [facility] Radiology Electronically Signed   By: Jacqulynn Cadet M.D.   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 COMPLICATIONS: None immediate. PROCEDURE: Informed written consent was obtained from the patient after a discussion of the risks, benefits and alternatives to treatment. A timeout was performed prior to the initiation of the procedure. Initial ultrasound scanning demonstrates a moderate amount of ascites within the right upper abdominal quadrant. The right upper abdomen was prepped and draped in the usual sterile fashion. 1% lidocaine with epinephrine was used for local anesthesia. Following this, a 19 gauge, 7-cm, Yueh catheter was introduced. An ultrasound image was saved for documentation purposes. The paracentesis was performed. The catheter was removed and a dressing was applied. The patient tolerated the procedure well without immediate post procedural complication. FINDINGS: A total of approximately 1300 mL of ascitic fluid was removed. IMPRESSION: Successful ultrasound-guided paracentesis yielding 1.3 liters of peritoneal fluid. Electronically Signed   By: Jacqulynn Cadet M.D.   On: 10/14/2017 17:16   US Thoracentesis Asp Pleural Space W/img Guide  Result Date: 10/26/2017 INDICATION: Sepsis, left pleural effusion EXAM: ULTRASOUND GUIDED LEFT THORACENTESIS MEDICATIONS: None. COMPLICATIONS: None immediate. PROCEDURE: An ultrasound guided thoracentesis was thoroughly discussed with the patient and questions answered. The benefits, risks, alternatives and complications were also discussed. The patient understands and wishes to proceed with the procedure. Written consent was obtained. Ultrasound was performed to localize and mark an adequate pocket of fluid in the left chest. The area was then prepped and draped in the normal sterile fashion. 1% Lidocaine was used for local anesthesia. Under ultrasound  guidance a multi sidehole catheter was introduced. Thoracentesis was performed. The catheter was removed and a dressing applied. Procedure performed by Rowe Robert PA. FINDINGS: A total of approximately 580 mL of blood tinged fluid was removed. Samples were sent to the laboratory as requested by the clinical team. IMPRESSION: Successful ultrasound guided left thoracentesis yielding 580 mL of pleural fluid. Follow-up chest radiograph shows no pneumothorax Electronically Signed   By: Lucrezia Europe M.D.   On: 10/26/2017 08:26    All questions were answered. The patient knows to call the clinic with any problems, questions or concerns. No barriers to learning was detected.  I spent 15 minutes counseling the patient face to face. The total time spent in the appointment was 20 minutes and more than 50% was on counseling and review of test results  [name], MD 11/03/2017 12:14 PM

## 2017-11-03 NOTE — Assessment & Plan Note (Addendum)
The patient is frail and had recent hospitalization Overall, she is slowly improving I recommend minimum 3 cycles of chemotherapy before repeat imaging study Clinically, I think she is responding to treatment Recent ultrasound paracentesis was canceled due to absence of ascitic fluid.

## 2017-11-03 NOTE — Assessment & Plan Note (Signed)
This has resolved She will continue laxatives as needed 

## 2017-11-03 NOTE — Assessment & Plan Note (Signed)
She has no further pain. She has excellent response to treatment clinically

## 2017-11-03 NOTE — Assessment & Plan Note (Signed)
Her nutrition is improving Despite weight loss, serum albumin is better I recommend frequent small meals

## 2017-11-04 LAB — CA 125: Cancer Antigen (CA) 125: 991.9 U/mL — ABNORMAL HIGH (ref 0.0–38.1)

## 2017-11-08 ENCOUNTER — Ambulatory Visit: Payer: PRIVATE HEALTH INSURANCE | Admitting: Hematology and Oncology

## 2017-11-08 ENCOUNTER — Other Ambulatory Visit: Payer: PRIVATE HEALTH INSURANCE

## 2017-11-08 ENCOUNTER — Inpatient Hospital Stay: Payer: Medicare PPO | Admitting: Nutrition

## 2017-11-08 ENCOUNTER — Inpatient Hospital Stay: Payer: Medicare PPO | Attending: Hematology and Oncology

## 2017-11-08 VITALS — BP 172/97 | HR 95 | Temp 98.1°F | Resp 18 | Wt 107.0 lb

## 2017-11-08 DIAGNOSIS — Z5189 Encounter for other specified aftercare: Secondary | ICD-10-CM | POA: Insufficient documentation

## 2017-11-08 DIAGNOSIS — R64 Cachexia: Secondary | ICD-10-CM | POA: Insufficient documentation

## 2017-11-08 DIAGNOSIS — Z5111 Encounter for antineoplastic chemotherapy: Secondary | ICD-10-CM | POA: Diagnosis present

## 2017-11-08 DIAGNOSIS — C482 Malignant neoplasm of peritoneum, unspecified: Secondary | ICD-10-CM | POA: Insufficient documentation

## 2017-11-08 DIAGNOSIS — G893 Neoplasm related pain (acute) (chronic): Secondary | ICD-10-CM | POA: Diagnosis not present

## 2017-11-08 MED ORDER — HEPARIN SOD (PORK) LOCK FLUSH 100 UNIT/ML IV SOLN
500.0000 [IU] | Freq: Once | INTRAVENOUS | Status: AC | PRN
  Administered 2017-11-08: 500 [IU]
  Filled 2017-11-08: qty 5

## 2017-11-08 MED ORDER — SODIUM CHLORIDE 0.9 % IV SOLN
381.0000 mg | Freq: Once | INTRAVENOUS | Status: AC
  Administered 2017-11-08: 380 mg via INTRAVENOUS
  Filled 2017-11-08: qty 38

## 2017-11-08 MED ORDER — FAMOTIDINE IN NACL 20-0.9 MG/50ML-% IV SOLN
20.0000 mg | Freq: Once | INTRAVENOUS | Status: AC
  Administered 2017-11-08: 20 mg via INTRAVENOUS

## 2017-11-08 MED ORDER — SODIUM CHLORIDE 0.9 % IV SOLN
Freq: Once | INTRAVENOUS | Status: AC
  Administered 2017-11-08: 12:00:00 via INTRAVENOUS

## 2017-11-08 MED ORDER — PALONOSETRON HCL INJECTION 0.25 MG/5ML
0.2500 mg | Freq: Once | INTRAVENOUS | Status: AC
  Administered 2017-11-08: 0.25 mg via INTRAVENOUS

## 2017-11-08 MED ORDER — SODIUM CHLORIDE 0.9 % IV SOLN
175.0000 mg/m2 | Freq: Once | INTRAVENOUS | Status: AC
  Administered 2017-11-08: 270 mg via INTRAVENOUS
  Filled 2017-11-08: qty 45

## 2017-11-08 MED ORDER — DIPHENHYDRAMINE HCL 50 MG/ML IJ SOLN
INTRAMUSCULAR | Status: AC
  Filled 2017-11-08: qty 1

## 2017-11-08 MED ORDER — DIPHENHYDRAMINE HCL 50 MG/ML IJ SOLN
50.0000 mg | Freq: Once | INTRAMUSCULAR | Status: AC
  Administered 2017-11-08: 50 mg via INTRAVENOUS

## 2017-11-08 MED ORDER — SODIUM CHLORIDE 0.9% FLUSH
10.0000 mL | INTRAVENOUS | Status: DC | PRN
  Administered 2017-11-08: 10 mL
  Filled 2017-11-08: qty 10

## 2017-11-08 MED ORDER — PALONOSETRON HCL INJECTION 0.25 MG/5ML
INTRAVENOUS | Status: AC
  Filled 2017-11-08: qty 5

## 2017-11-08 MED ORDER — SODIUM CHLORIDE 0.9 % IV SOLN
Freq: Once | INTRAVENOUS | Status: AC
  Administered 2017-11-08: 12:00:00 via INTRAVENOUS
  Filled 2017-11-08: qty 5

## 2017-11-08 MED ORDER — FAMOTIDINE IN NACL 20-0.9 MG/50ML-% IV SOLN
INTRAVENOUS | Status: AC
  Filled 2017-11-08: qty 50

## 2017-11-08 NOTE — Patient Instructions (Signed)
   [facility] Discharge Instructions for Patients Receiving Chemotherapy  Today you received the following chemotherapy agents Taxol and Carboplatin   To help prevent nausea and vomiting after your treatment, we encourage you to take your nausea medication as directed.    If you develop nausea and vomiting that is not controlled by your nausea medication, call the clinic.   BELOW ARE SYMPTOMS THAT SHOULD BE REPORTED IMMEDIATELY:  *FEVER GREATER THAN 100.5 F  *CHILLS WITH OR WITHOUT FEVER  NAUSEA AND VOMITING THAT IS NOT CONTROLLED WITH YOUR NAUSEA MEDICATION  *UNUSUAL SHORTNESS OF BREATH  *UNUSUAL BRUISING OR BLEEDING  TENDERNESS IN MOUTH AND THROAT WITH OR WITHOUT PRESENCE OF ULCERS  *URINARY PROBLEMS  *BOWEL PROBLEMS  UNUSUAL RASH Items with * indicate a potential emergency and should be followed up as soon as possible.  Feel free to call the clinic should you have any questions or concerns. The clinic phone number is [phone].  Please show the CHEMO ALERT CARD at check-in to the Emergency Department and triage nurse.   

## 2017-11-08 NOTE — Progress Notes (Signed)
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 Weight increased and documented as 107 pounds June 3, increased from 105.9 pounds May 29. Weight documented on May 9 at 118 pounds. Albumin noted to be 3.3. Patient reports her appetite has increased. She is drinking approximately 2 boost plus daily. Patient has questions regarding the supplement boost breeze. Patient denies other nutrition impact symptoms.  Nutrition diagnosis: Unintended weight loss improved.  Intervention: Encourage patient to increase boost plus 3 times daily between meals. Educated patient on boost resource breeze.  Provided information on purchasing product. Provided coupons. Again reviewed strategies on increasing calories and protein. Questions were answered.  Teach back method used.  Monitoring, evaluation, goals: Patient will tolerate increased calories and protein to minimize weight loss.  Next visit: Monday, July 15 during infusion.  **Disclaimer: This note was dictated with voice recognition software. Similar sounding words can inadvertently be transcribed and this note may contain transcription errors which may not have been corrected upon publication of note.**

## 2017-11-10 ENCOUNTER — Inpatient Hospital Stay: Payer: Medicare PPO

## 2017-11-10 VITALS — BP 130/78 | HR 86 | Temp 98.2°F | Resp 18

## 2017-11-10 DIAGNOSIS — C482 Malignant neoplasm of peritoneum, unspecified: Secondary | ICD-10-CM

## 2017-11-10 DIAGNOSIS — Z5111 Encounter for antineoplastic chemotherapy: Secondary | ICD-10-CM | POA: Diagnosis not present

## 2017-11-10 MED ORDER — PEGFILGRASTIM-CBQV 6 MG/0.6ML ~~LOC~~ SOSY
6.0000 mg | PREFILLED_SYRINGE | Freq: Once | SUBCUTANEOUS | Status: AC
  Administered 2017-11-10: 6 mg via SUBCUTANEOUS

## 2017-11-10 NOTE — Patient Instructions (Signed)
Pegfilgrastim injection What is this medicine? PEGFILGRASTIM (PEG fil gra stim) is a long-acting granulocyte colony-stimulating factor that stimulates the growth of neutrophils, a type of white blood cell important in the body's fight against infection. It is used to reduce the incidence of fever and infection in patients with certain types of cancer who are receiving chemotherapy that affects the bone marrow, and to increase survival after being exposed to high doses of radiation. This medicine may be used for other purposes; ask your health care provider or pharmacist if you have questions. COMMON BRAND NAME(S): Neulasta What should I tell my health care provider before I take this medicine? They need to know if you have any of these conditions: -kidney disease -latex allergy -ongoing radiation therapy -sickle cell disease -skin reactions to acrylic adhesives (On-Body Injector only) -an unusual or allergic reaction to pegfilgrastim, filgrastim, other medicines, foods, dyes, or preservatives -pregnant or trying to get pregnant -breast-feeding How should I use this medicine? This medicine is for injection under the skin. If you get this medicine at home, you will be taught how to prepare and give the pre-filled syringe or how to use the On-body Injector. Refer to the patient Instructions for Use for detailed instructions. Use exactly as directed. Tell your healthcare provider immediately if you suspect that the On-body Injector may not have performed as intended or if you suspect the use of the On-body Injector resulted in a missed or partial dose. It is important that you put your used needles and syringes in a special sharps container. Do not put them in a trash can. If you do not have a sharps container, call your pharmacist or healthcare provider to get one. Talk to your pediatrician regarding the use of this medicine in children. While this drug may be prescribed for selected conditions,  precautions do apply. Overdosage: If you think you have taken too much of this medicine contact a poison control center or emergency room at once. NOTE: This medicine is only for you. Do not share this medicine with others. What if I miss a dose? It is important not to miss your dose. Call your doctor or health care professional if you miss your dose. If you miss a dose due to an On-body Injector failure or leakage, a new dose should be administered as soon as possible using a single prefilled syringe for manual use. What may interact with this medicine? Interactions have not been studied. Give your health care provider a list of all the medicines, herbs, non-prescription drugs, or dietary supplements you use. Also tell them if you smoke, drink alcohol, or use illegal drugs. Some items may interact with your medicine. This list may not describe all possible interactions. Give your health care provider a list of all the medicines, herbs, non-prescription drugs, or dietary supplements you use. Also tell them if you smoke, drink alcohol, or use illegal drugs. Some items may interact with your medicine. What should I watch for while using this medicine? You may need blood work done while you are taking this medicine. If you are going to need a MRI, CT scan, or other procedure, tell your doctor that you are using this medicine (On-Body Injector only). What side effects may I notice from receiving this medicine? Side effects that you should report to your doctor or health care professional as soon as possible: -allergic reactions like skin rash, itching or hives, swelling of the face, lips, or tongue -dizziness -fever -pain, redness, or irritation at site   where injected -pinpoint red spots on the skin -red or dark-brown urine -shortness of breath or breathing problems -stomach or side pain, or pain at the shoulder -swelling -tiredness -trouble passing urine or change in the amount of urine Side  effects that usually do not require medical attention (report to your doctor or health care professional if they continue or are bothersome): -bone pain -muscle pain This list may not describe all possible side effects. Call your doctor for medical advice about side effects. You may report side effects to FDA at 1-800-FDA-1088. Where should I keep my medicine? Keep out of the reach of children. Store pre-filled syringes in a refrigerator between 2 and 8 degrees C (36 and 46 degrees F). Do not freeze. Keep in carton to protect from light. Throw away this medicine if it is left out of the refrigerator for more than 48 hours. Throw away any unused medicine after the expiration date. NOTE: This sheet is a summary. It may not cover all possible information. If you have questions about this medicine, talk to your doctor, pharmacist, or health care provider.  2018 Elsevier/Gold Standard (2016-05-21 12:58:03)  

## 2017-11-12 ENCOUNTER — Other Ambulatory Visit: Payer: Self-pay

## 2017-11-12 ENCOUNTER — Telehealth: Payer: Self-pay | Admitting: Oncology

## 2017-11-12 ENCOUNTER — Telehealth: Payer: Self-pay

## 2017-11-12 MED ORDER — FLUCONAZOLE 100 MG PO TABS: 100.0000 mg | ORAL_TABLET | Freq: Every day | ORAL | 0 refills | Status: DC

## 2017-11-12 NOTE — Telephone Encounter (Signed)
[name] (patient's son) called and said [facility] has contacted [name] about white clusters in Tylasia's mouth with a sore throat yesterday afternoon.  He also wanted to let us know that she also has a headache and diarrhea (4 times already) this morning. Her temperature this morning is 99.3.  Please advise.

## 2017-11-12 NOTE — Telephone Encounter (Signed)
Called [facility] and left a message for Pam regarding IV fluids over the weekend.

## 2017-11-12 NOTE — Telephone Encounter (Signed)
Received transfer message from yesterday from triage from nurse at [facility].  Nurse saw patient yesterday. She is complaining of a sore throat, with white patches on her tongue and throat. It looks like thrush. Can the office call Rx in or does she need appt?

## 2017-11-12 NOTE — Telephone Encounter (Signed)
She just finished chemo a few days ago, unlikely neutropenic I recommend fluconazole 100 mg daily PO x 7 days Please get [facility] to give IVF daily 1 liter normal saline over the weekend and take imodium as needed for diarrhea. Stop laxatives, of course PLease instruct family to call for update on Monday next week

## 2017-11-12 NOTE — Telephone Encounter (Signed)
Called son and given below message. He verbalized understanding and will call Monday with a update. Rx sent to pharmacy.

## 2017-11-15 ENCOUNTER — Telehealth: Payer: Self-pay | Admitting: Oncology

## 2017-11-15 NOTE — Telephone Encounter (Signed)
Thanks for the update

## 2017-11-15 NOTE — Telephone Encounter (Signed)
Called patient's son, Jo Parker, to see if Jo Parker is feeling better.  He said she is feeling much better and stronger today.  He said the white patches in her mouth have gone away and that the diarrhea has stopped.

## 2017-11-18 ENCOUNTER — Telehealth: Payer: Self-pay | Admitting: Oncology

## 2017-11-18 NOTE — Telephone Encounter (Signed)
Yes, OK to go away for the weekend

## 2017-11-18 NOTE — Telephone Encounter (Signed)
Jo Parker called and said his mom is doing great.  He said her appetite has greatly improved and she is eating a lot.  She recently had a friend pass away and [place]  He said they would be back for her appointments on Monday.

## 2017-11-26 ENCOUNTER — Inpatient Hospital Stay
Admission: RE | Admit: 2017-11-26 | Discharge: 2017-11-26 | Disposition: A | Discharge status: Home / Self Care | Payer: Self-pay | Source: Ambulatory Visit | Attending: Gynecologic Oncology | Admitting: Gynecologic Oncology

## 2017-11-26 ENCOUNTER — Other Ambulatory Visit: Payer: Self-pay | Admitting: Gynecologic Oncology

## 2017-11-26 DIAGNOSIS — C801 Malignant (primary) neoplasm, unspecified: Secondary | ICD-10-CM

## 2017-11-29 ENCOUNTER — Inpatient Hospital Stay (HOSPITAL_BASED_OUTPATIENT_CLINIC_OR_DEPARTMENT_OTHER): Payer: Medicare PPO | Admitting: Hematology and Oncology

## 2017-11-29 ENCOUNTER — Encounter: Payer: Self-pay | Admitting: Hematology and Oncology

## 2017-11-29 ENCOUNTER — Telehealth: Payer: Self-pay | Admitting: Hematology and Oncology

## 2017-11-29 ENCOUNTER — Inpatient Hospital Stay: Payer: Medicare PPO

## 2017-11-29 ENCOUNTER — Other Ambulatory Visit: Payer: Self-pay | Admitting: Hematology and Oncology

## 2017-11-29 ENCOUNTER — Telehealth: Payer: Self-pay | Admitting: Oncology

## 2017-11-29 VITALS — BP 157/93 | HR 97 | Temp 97.9°F | Resp 18 | Ht 63.0 in | Wt 108.5 lb

## 2017-11-29 DIAGNOSIS — C800 Disseminated malignant neoplasm, unspecified: Secondary | ICD-10-CM

## 2017-11-29 DIAGNOSIS — G893 Neoplasm related pain (acute) (chronic): Secondary | ICD-10-CM

## 2017-11-29 DIAGNOSIS — C482 Malignant neoplasm of peritoneum, unspecified: Secondary | ICD-10-CM

## 2017-11-29 DIAGNOSIS — E43 Unspecified severe protein-calorie malnutrition: Secondary | ICD-10-CM

## 2017-11-29 DIAGNOSIS — R64 Cachexia: Secondary | ICD-10-CM

## 2017-11-29 DIAGNOSIS — Z5111 Encounter for antineoplastic chemotherapy: Secondary | ICD-10-CM | POA: Diagnosis not present

## 2017-11-29 LAB — CMP (CANCER CENTER ONLY)
ALBUMIN: 3.6 g/dL (ref 3.5–5.0)
ALT: 26 U/L (ref 0–55)
AST: 23 U/L (ref 5–34)
Alkaline Phosphatase: 131 U/L (ref 40–150)
Anion gap: 11 (ref 3–11)
BILIRUBIN TOTAL: 0.4 mg/dL (ref 0.2–1.2)
BUN: 21 mg/dL (ref 7–26)
CO2: 24 mmol/L (ref 22–29)
CREATININE: 0.87 mg/dL (ref 0.60–1.10)
Calcium: 9.4 mg/dL (ref 8.4–10.4)
Chloride: 104 mmol/L (ref 98–109)
GFR, Est AFR Am: >60 mL/min (ref >60)
GFR, Estimated: >60 mL/min (ref >60)
GLUCOSE: 276 mg/dL — AB (ref 70–140)
Potassium: 3.8 mmol/L (ref 3.5–5.1)
Sodium: 139 mmol/L (ref 136–145)
TOTAL PROTEIN: 7.6 g/dL (ref 6.4–8.3)

## 2017-11-29 LAB — CBC WITH DIFFERENTIAL (CANCER CENTER ONLY)
BASOS PCT: 0 %
Basophils Absolute: 0 10*3/uL (ref 0.0–0.1)
Eosinophils Absolute: 0 10*3/uL (ref 0.0–0.5)
Eosinophils Relative: 0 %
HEMATOCRIT: 32.8 % — AB (ref 34.8–46.6)
HEMOGLOBIN: 10.4 g/dL — AB (ref 11.6–15.9)
Lymphocytes Relative: 12 %
Lymphs Abs: 0.9 10*3/uL (ref 0.9–3.3)
MCH: 21.1 pg — ABNORMAL LOW (ref 25.1–34.0)
MCHC: 31.7 g/dL (ref 31.5–36.0)
MCV: 66.5 fL — ABNORMAL LOW (ref 79.5–101.0)
MONOS PCT: 0 %
Monocytes Absolute: 0 10*3/uL — ABNORMAL LOW (ref 0.1–0.9)
NEUTROS ABS: 6.7 10*3/uL — AB (ref 1.5–6.5)
NEUTROS PCT: 88 %
Platelet Count: 306 10*3/uL (ref 145–400)
RBC: 4.93 MIL/uL (ref 3.70–5.45)
RDW: 26.9 % — ABNORMAL HIGH (ref 11.2–14.5)
WBC Count: 7.6 10*3/uL (ref 3.9–10.3)

## 2017-11-29 MED ORDER — SODIUM CHLORIDE 0.9 % IV SOLN
175.0000 mg/m2 | Freq: Once | INTRAVENOUS | Status: AC
  Administered 2017-11-29: 270 mg via INTRAVENOUS
  Filled 2017-11-29: qty 45

## 2017-11-29 MED ORDER — SODIUM CHLORIDE 0.9 % IV SOLN
Freq: Once | INTRAVENOUS | Status: AC
  Administered 2017-11-29: 12:00:00 via INTRAVENOUS

## 2017-11-29 MED ORDER — MIRTAZAPINE 30 MG PO TABS: 30.0000 mg | ORAL_TABLET | Freq: Every day | ORAL | 1 refills | Status: DC

## 2017-11-29 MED ORDER — SODIUM CHLORIDE 0.9 % IV SOLN
381.0000 mg | Freq: Once | INTRAVENOUS | Status: AC
  Administered 2017-11-29: 380 mg via INTRAVENOUS
  Filled 2017-11-29: qty 38

## 2017-11-29 MED ORDER — PALONOSETRON HCL INJECTION 0.25 MG/5ML
0.2500 mg | Freq: Once | INTRAVENOUS | Status: AC
  Administered 2017-11-29: 0.25 mg via INTRAVENOUS

## 2017-11-29 MED ORDER — SODIUM CHLORIDE 0.9% FLUSH
10.0000 mL | INTRAVENOUS | Status: DC | PRN
  Filled 2017-11-29: qty 10

## 2017-11-29 MED ORDER — FAMOTIDINE IN NACL 20-0.9 MG/50ML-% IV SOLN
20.0000 mg | Freq: Once | INTRAVENOUS | Status: AC
  Administered 2017-11-29: 20 mg via INTRAVENOUS

## 2017-11-29 MED ORDER — SODIUM CHLORIDE 0.9 % IV SOLN
Freq: Once | INTRAVENOUS | Status: AC
  Administered 2017-11-29: 13:00:00 via INTRAVENOUS
  Filled 2017-11-29: qty 5

## 2017-11-29 MED ORDER — HYDROCODONE-ACETAMINOPHEN 10-325 MG PO TABS: 1.0000 | ORAL_TABLET | ORAL | 0 refills | Status: DC | PRN

## 2017-11-29 MED ORDER — FAMOTIDINE IN NACL 20-0.9 MG/50ML-% IV SOLN
INTRAVENOUS | Status: AC
  Filled 2017-11-29: qty 50

## 2017-11-29 MED ORDER — PALONOSETRON HCL INJECTION 0.25 MG/5ML
INTRAVENOUS | Status: AC
  Filled 2017-11-29: qty 5

## 2017-11-29 MED ORDER — DIPHENHYDRAMINE HCL 50 MG/ML IJ SOLN
INTRAMUSCULAR | Status: AC
  Filled 2017-11-29: qty 1

## 2017-11-29 MED ORDER — HEPARIN SOD (PORK) LOCK FLUSH 100 UNIT/ML IV SOLN
500.0000 [IU] | Freq: Once | INTRAVENOUS | Status: DC | PRN
  Filled 2017-11-29: qty 5

## 2017-11-29 MED ORDER — DIPHENHYDRAMINE HCL 50 MG/ML IJ SOLN
50.0000 mg | Freq: Once | INTRAMUSCULAR | Status: AC
  Administered 2017-11-29: 50 mg via INTRAVENOUS

## 2017-11-29 NOTE — Patient Instructions (Signed)
   [facility] Discharge Instructions for Patients Receiving Chemotherapy  Today you received the following chemotherapy agents Taxol and Carboplatin   To help prevent nausea and vomiting after your treatment, we encourage you to take your nausea medication as directed.    If you develop nausea and vomiting that is not controlled by your nausea medication, call the clinic.   BELOW ARE SYMPTOMS THAT SHOULD BE REPORTED IMMEDIATELY:  *FEVER GREATER THAN 100.5 F  *CHILLS WITH OR WITHOUT FEVER  NAUSEA AND VOMITING THAT IS NOT CONTROLLED WITH YOUR NAUSEA MEDICATION  *UNUSUAL SHORTNESS OF BREATH  *UNUSUAL BRUISING OR BLEEDING  TENDERNESS IN MOUTH AND THROAT WITH OR WITHOUT PRESENCE OF ULCERS  *URINARY PROBLEMS  *BOWEL PROBLEMS  UNUSUAL RASH Items with * indicate a potential emergency and should be followed up as soon as possible.  Feel free to call the clinic should you have any questions or concerns. The clinic phone number is [phone].  Please show the CHEMO ALERT CARD at check-in to the Emergency Department and triage nurse.   

## 2017-11-29 NOTE — Assessment & Plan Note (Signed)
Her appetite is improving and she has started to gain weight I refilled her prescription Remeron at higher dose and recommend she continues to take it

## 2017-11-29 NOTE — Assessment & Plan Note (Signed)
She is doing very well clinically I felt that she has positive response to treatment We will proceed with cycle 3 of chemotherapy without dose adjustment I will plan to order CT imaging before her next dose of treatment She has appointment to see GYN oncologist to determine if she would be ready for interval debulking surgery

## 2017-11-29 NOTE — Assessment & Plan Note (Addendum)
She has minimum abdominal pain recently I prescribed narcotic prescription to be used as needed for G-CSF related bone pain or worsening cancer pain

## 2017-11-29 NOTE — Telephone Encounter (Signed)
Jo Parker called to confirm the appointment time with Dr. Alvy Bimler today.  Advised him that it is at 10:30.  He verbalized agreement.

## 2017-11-29 NOTE — Progress Notes (Signed)
[facility] OFFICE PROGRESS NOTE  Patient Care Team: Patient, [name] Per as PCP - General (General Practice)  ASSESSMENT & PLAN:  Primary peritoneal carcinomatosis (Jo Parker) She is doing very well clinically I felt that she has positive response to treatment We will proceed with cycle 3 of chemotherapy without dose adjustment I will plan to order CT imaging before her next dose of treatment She has appointment to see GYN oncologist to determine if she would be ready for interval debulking surgery  Cancer associated pain She has minimum abdominal pain recently I prescribed narcotic prescription to be used as needed for G-CSF related bone pain or worsening cancer pain  Malignant cachexia ([name]) Her appetite is improving and she has started to gain weight I refilled her prescription Remeron at higher dose and recommend she continues to take it   Orders Placed This Encounter  Procedures  . CT ABDOMEN PELVIS W CONTRAST    Standing Status:   Future    Standing Expiration Date:   11/30/2018    Order Specific Question:   If indicated for the ordered procedure, I authorize the administration of contrast media per Radiology protocol    Answer:   Yes    Order Specific Question:   Preferred imaging location?    Answer:   [facility]    Order Specific Question:   Radiology Contrast Protocol - do NOT remove file path    Answer:   [email or web address]    INTERVAL HISTORY: Please see below for problem oriented charting. 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      10/08/2017 Procedure    Extensive omental and peritoneal tumor is present anteriorly deep to the abdominal wall. From a midline approach, tumor was sampled just to the left of midline in the upper pelvis, below the level of the umbilicus. Solid tissue was obtained.  IMPRESSION: CT-guided core biopsy performed of omental/peritoneal tumor in the anterior peritoneal cavity.      10/08/2017 Pathology Results    Soft Tissue Needle Core Biopsy, Omentum/Peritoneal Cavity - POORLY DIFFERENTIATED CARCINOMA. Microscopic Comment Immunohistochemistry will be performed and reported as an addendum. Dr. [name] agrees. Called to [facility] on 10/11/17. (JDP:kh 10/11/17) ADDENDUM: Immunohistochemistry shows the tumor is positive with cytokeratin AE1/AE3, cytokeratin 7, estrogen receptor, WT-1, and p16. The tumor is negative with CD56, chromogranin, synaptophysin, CDX-2, cytokeratin 20, progesterone receptor, GATA-3, GCDFP, Napsin-A and thyroid transcription factor-1. 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 The catheter is ready for use.      10/14/2017 Procedure    A total of approximately 1300 mL of ascitic fluid was removed.      10/15/2017 Tumor Marker    Patient's tumor was tested for the following markers: CA-125 Results of the tumor marker test revealed 775.9      10/18/2017 -  Chemotherapy    The patient had carboplatin and Taxol      10/22/2017 - 10/27/2017 Hospital Admission    She was admitted to the hospital for evaluation of shortness of breath and was found to have large pleural effusion, successfully removed and cytology was malignant.  Sepsis was ruled out.      10/23/2017 Imaging    No evidence of pulmonary embolism.  New moderate size left effusion and new small to moderate right effusion with associated bibasilar atelectasis.  Known extensive peritoneal carcinomatosis and moderate ascites.  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 Recommend correlation with prior exams and vascular surgery consultation.  2.1 cm left renal cyst.  Mild cardiomegaly and minimal atherosclerotic coronary artery disease.  Aortic Atherosclerosis (ICD10-I70.0) and Emphysema (ICD10-J43.9).      10/24/2017 Pathology Results    PLEURAL FLUID, LEFT (SPECIMEN 1 OF 1 COLLECTED 10/24/17): MALIGNANT CELLS CONSISTENT WITH METASTATIC ADENOCARCINOMA. SEE COMMENT.      10/24/2017 Procedure    Successful ultrasound guided left thoracentesis yielding 580 mL of pleural fluid.  Follow-up chest radiograph shows no pneumothorax      11/03/2017 Tumor Marker    Patient's tumor was tested for the following markers: CA-125 Results of the tumor marker test revealed 991.9       REVIEW OF SYSTEMS:   Constitutional: Denies fevers, chills or abnormal weight loss Eyes: Denies blurriness of vision Ears, nose, mouth, throat, and face: Denies  mucositis or sore throat Respiratory: Denies cough, dyspnea or wheezes Cardiovascular: Denies palpitation, chest discomfort or lower extremity swelling Gastrointestinal:  Denies nausea, heartburn or change in bowel habits Skin: Denies abnormal skin rashes Lymphatics: Denies new lymphadenopathy or easy bruising Neurological:Denies numbness, tingling or new weaknesses Behavioral/Psych: Mood is stable, no new changes  All other systems were reviewed with the patient and are negative.  I have reviewed the past medical history, past surgical history, social history and family history with the patient and they are unchanged from previous note.  ALLERGIES:  has No Known Allergies.  MEDICATIONS:  Current Outpatient Medications  Medication Sig Dispense Refill  . aspirin EC 81 MG tablet Take 81 mg by mouth daily.    Marland Kitchen atorvastatin (LIPITOR) 80 MG tablet Take 80 mg by mouth daily.    . budesonide-formoterol (SYMBICORT) 160-4.5 MCG/ACT inhaler Inhale 2 puffs into the lungs 2 (two) times daily. Use two puffs by mouth twice daily(rinse mouth after use)    . carvedilol (COREG) 12.5 MG tablet Take 12.5 mg by mouth 2 (two) times daily with a meal.    . citalopram (CELEXA) 20 MG tablet Take 1 tablet (20 mg total) by mouth daily. 30 tablet 0  . dexamethasone (DECADRON) 4 MG tablet Take 5 tabs the  night before and 5 tabs the morning of chemotherapy, every 3 weeks with food 60 tablet 0  . HYDROcodone-acetaminophen (NORCO) 10-325 MG tablet Take 1 tablet by mouth every 4 (four) hours as needed for moderate pain or severe pain. 60 tablet 0  . lidocaine-prilocaine (EMLA) cream Apply to affected area once 30 g 3  . meclizine (ANTIVERT) 25 MG tablet Take 25 mg by mouth 3 (three) times daily as needed for dizziness.    . Melatonin 5 MG CAPS Take 2 capsules by mouth at bedtime as needed (sleep). Take 2 capsules at bedtime as needed    . mirtazapine (REMERON) 30 MG tablet Take 1 tablet (30 mg total) by mouth at  bedtime. 90 tablet 1  . ondansetron (ZOFRAN) 4 MG tablet Take 4 mg by mouth every 6 (six) hours as needed for nausea or vomiting.    . polyethylene glycol (MIRALAX / GLYCOLAX) packet Take 17 g by mouth daily. 14 each 0  . prochlorperazine (COMPAZINE) 10 MG tablet Take 1 tablet (10 mg total) by mouth every 6 (six) hours as needed (Nausea or vomiting). 30 tablet 1   No current facility-administered medications for this visit.     PHYSICAL EXAMINATION: ECOG PERFORMANCE STATUS: 1 - Symptomatic but completely ambulatory  Vitals:   11/29/17 1114  BP: (!) 157/93  Pulse: 97  Resp: 18  Temp: 97.9 F (36.6 C)  SpO2: 99%   Filed Weights   11/29/17 1114  Weight: 108 lb 8 oz (49.2 kg)    GENERAL:alert, no distress and comfortable SKIN: skin color, texture, turgor are normal, no rashes or significant lesions EYES: normal, Conjunctiva are pink and non-injected, sclera clear OROPHARYNX:no exudate, no erythema and lips, buccal mucosa, and tongue normal  NECK: supple, thyroid normal size, non-tender, without nodularity LYMPH:  no palpable lymphadenopathy in the cervical, axillary or inguinal LUNGS: clear to auscultation and percussion with normal breathing effort HEART: regular rate & rhythm and no murmurs and no lower extremity edema ABDOMEN:abdomen soft, non-tender and normal bowel sounds Musculoskeletal:no cyanosis of digits and no clubbing  NEURO: alert & oriented x 3 with fluent speech, no focal motor/sensory deficits  LABORATORY DATA:  I have reviewed the data as listed    Component Value Date/Time   NA 143 11/03/2017 1056   K 3.8 11/03/2017 1056   CL 104 11/03/2017 1056   CO2 25 11/03/2017 1056   GLUCOSE 92 11/03/2017 1056   BUN 16 11/03/2017 1056   CREATININE 0.69 11/03/2017 1056   CALCIUM 9.8 11/03/2017 1056   PROT 8.2 11/03/2017 1056   ALBUMIN 3.3 (L) 11/03/2017 1056   AST 35 (H) 11/03/2017 1056   ALT 35 11/03/2017 1056   ALKPHOS 95 11/03/2017 1056   BILITOT 0.3  11/03/2017 1056   GFRNONAA >60 11/03/2017 1056   GFRAA >60 11/03/2017 1056    No results found for: SPEP, UPEP  Lab Results  Component Value Date   WBC 7.6 11/29/2017   NEUTROABS 6.7 (H) 11/29/2017   HGB 10.4 (L) 11/29/2017   HCT 32.8 (L) 11/29/2017   MCV 66.5 (L) 11/29/2017   PLT 306 11/29/2017      Chemistry      Component Value Date/Time   NA 143 11/03/2017 1056   K 3.8 11/03/2017 1056   CL 104 11/03/2017 1056   CO2 25 11/03/2017 1056   BUN 16 11/03/2017 1056   CREATININE 0.69 11/03/2017 1056      Component Value Date/Time   CALCIUM 9.8 11/03/2017 1056  ALKPHOS 95 11/03/2017 1056   AST 35 (H) 11/03/2017 1056   ALT 35 11/03/2017 1056   BILITOT 0.3 11/03/2017 1056       All questions were answered. The patient knows to call the clinic with any problems, questions or concerns. No barriers to learning was detected.  I spent 15 minutes counseling the patient face to face. The total time spent in the appointment was 20 minutes and more than 50% was on counseling and review of test results  [name], MD 11/29/2017 11:42 AM

## 2017-11-29 NOTE — Telephone Encounter (Signed)
Gave contrast and calendar

## 2017-11-30 ENCOUNTER — Telehealth: Payer: Self-pay

## 2017-11-30 LAB — CA 125: Cancer Antigen (CA) 125: 133 U/mL — ABNORMAL HIGH (ref 0.0–38.1)

## 2017-11-30 NOTE — Telephone Encounter (Signed)
Called and given below message. Verbalized understanding. 

## 2017-11-30 NOTE — Telephone Encounter (Signed)
-----   Message from [name], MD sent at 11/30/2017  8:31 AM EDT ----- Regarding: CA-125 Pls let her know CA-125 result is very good ----- Message ----- From: Interface, Lab In [facility] Sent: 11/29/2017  11:17 AM To: [name], MD

## 2017-12-01 ENCOUNTER — Other Ambulatory Visit: Payer: Self-pay | Admitting: Hematology and Oncology

## 2017-12-01 ENCOUNTER — Inpatient Hospital Stay: Payer: Medicare PPO

## 2017-12-01 DIAGNOSIS — Z5111 Encounter for antineoplastic chemotherapy: Secondary | ICD-10-CM | POA: Diagnosis not present

## 2017-12-01 DIAGNOSIS — C482 Malignant neoplasm of peritoneum, unspecified: Secondary | ICD-10-CM

## 2017-12-01 MED ORDER — PEGFILGRASTIM-CBQV 6 MG/0.6ML ~~LOC~~ SOSY
6.0000 mg | PREFILLED_SYRINGE | Freq: Once | SUBCUTANEOUS | Status: AC
  Administered 2017-12-01: 6 mg via SUBCUTANEOUS

## 2017-12-01 MED FILL — HYDROCODON-APAP 10-325: 10-325 | 10 days supply | Qty: 60 | Fill #0

## 2017-12-01 MED FILL — MIRTAZAPINE 30 MG TABLET: 30 | 90 days supply | Qty: 90 | Fill #0

## 2017-12-01 NOTE — Patient Instructions (Signed)
Pegfilgrastim injection What is this medicine? PEGFILGRASTIM (PEG fil gra stim) is a long-acting granulocyte colony-stimulating factor that stimulates the growth of neutrophils, a type of white blood cell important in the body's fight against infection. It is used to reduce the incidence of fever and infection in patients with certain types of cancer who are receiving chemotherapy that affects the bone marrow, and to increase survival after being exposed to high doses of radiation. This medicine may be used for other purposes; ask your health care provider or pharmacist if you have questions. COMMON BRAND NAME(S): Neulasta What should I tell my health care provider before I take this medicine? They need to know if you have any of these conditions: -kidney disease -latex allergy -ongoing radiation therapy -sickle cell disease -skin reactions to acrylic adhesives (On-Body Injector only) -an unusual or allergic reaction to pegfilgrastim, filgrastim, other medicines, foods, dyes, or preservatives -pregnant or trying to get pregnant -breast-feeding How should I use this medicine? This medicine is for injection under the skin. If you get this medicine at home, you will be taught how to prepare and give the pre-filled syringe or how to use the On-body Injector. Refer to the patient Instructions for Use for detailed instructions. Use exactly as directed. Tell your healthcare provider immediately if you suspect that the On-body Injector may not have performed as intended or if you suspect the use of the On-body Injector resulted in a missed or partial dose. It is important that you put your used needles and syringes in a special sharps container. Do not put them in a trash can. If you do not have a sharps container, call your pharmacist or healthcare provider to get one. Talk to your pediatrician regarding the use of this medicine in children. While this drug may be prescribed for selected conditions,  precautions do apply. Overdosage: If you think you have taken too much of this medicine contact a poison control center or emergency room at once. NOTE: This medicine is only for you. Do not share this medicine with others. What if I miss a dose? It is important not to miss your dose. Call your doctor or health care professional if you miss your dose. If you miss a dose due to an On-body Injector failure or leakage, a new dose should be administered as soon as possible using a single prefilled syringe for manual use. What may interact with this medicine? Interactions have not been studied. Give your health care provider a list of all the medicines, herbs, non-prescription drugs, or dietary supplements you use. Also tell them if you smoke, drink alcohol, or use illegal drugs. Some items may interact with your medicine. This list may not describe all possible interactions. Give your health care provider a list of all the medicines, herbs, non-prescription drugs, or dietary supplements you use. Also tell them if you smoke, drink alcohol, or use illegal drugs. Some items may interact with your medicine. What should I watch for while using this medicine? You may need blood work done while you are taking this medicine. If you are going to need a MRI, CT scan, or other procedure, tell your doctor that you are using this medicine (On-Body Injector only). What side effects may I notice from receiving this medicine? Side effects that you should report to your doctor or health care professional as soon as possible: -allergic reactions like skin rash, itching or hives, swelling of the face, lips, or tongue -dizziness -fever -pain, redness, or irritation at site   where injected -pinpoint red spots on the skin -red or dark-brown urine -shortness of breath or breathing problems -stomach or side pain, or pain at the shoulder -swelling -tiredness -trouble passing urine or change in the amount of urine Side  effects that usually do not require medical attention (report to your doctor or health care professional if they continue or are bothersome): -bone pain -muscle pain This list may not describe all possible side effects. Call your doctor for medical advice about side effects. You may report side effects to FDA at 1-800-FDA-1088. Where should I keep my medicine? Keep out of the reach of children. Store pre-filled syringes in a refrigerator between 2 and 8 degrees C (36 and 46 degrees F). Do not freeze. Keep in carton to protect from light. Throw away this medicine if it is left out of the refrigerator for more than 48 hours. Throw away any unused medicine after the expiration date. NOTE: This sheet is a summary. It may not cover all possible information. If you have questions about this medicine, talk to your doctor, pharmacist, or health care provider.  2018 Elsevier/Gold Standard (2016-05-21 12:58:03)  

## 2017-12-03 ENCOUNTER — Telehealth: Payer: Self-pay | Admitting: Oncology

## 2017-12-03 NOTE — Telephone Encounter (Signed)
Patient's son called and said that Jo Parker wants to go to [place] this weekend.  She is feeling good after her last chemotherapy.  Discussed that she should avoid crowds and to get plenty of rest.  He verbalized agreement.

## 2017-12-14 ENCOUNTER — Encounter (HOSPITAL_COMMUNITY): Payer: Self-pay

## 2017-12-14 ENCOUNTER — Ambulatory Visit (HOSPITAL_COMMUNITY)
Admission: RE | Admit: 2017-12-14 | Discharge: 2017-12-14 | Disposition: A | Discharge status: Home / Self Care | Payer: Medicare PPO | Source: Ambulatory Visit | Attending: Hematology and Oncology | Admitting: Hematology and Oncology

## 2017-12-14 DIAGNOSIS — E43 Unspecified severe protein-calorie malnutrition: Secondary | ICD-10-CM

## 2017-12-14 DIAGNOSIS — R64 Cachexia: Secondary | ICD-10-CM | POA: Diagnosis present

## 2017-12-14 DIAGNOSIS — I714 Abdominal aortic aneurysm, without rupture: Secondary | ICD-10-CM | POA: Diagnosis not present

## 2017-12-14 DIAGNOSIS — C482 Malignant neoplasm of peritoneum, unspecified: Secondary | ICD-10-CM

## 2017-12-14 DIAGNOSIS — C800 Disseminated malignant neoplasm, unspecified: Secondary | ICD-10-CM | POA: Insufficient documentation

## 2017-12-14 MED ORDER — IOPAMIDOL (ISOVUE-300) INJECTION 61%
INTRAVENOUS | Status: AC
  Filled 2017-12-14: qty 100

## 2017-12-14 MED ORDER — IOPAMIDOL (ISOVUE-300) INJECTION 61%
100.0000 mL | Freq: Once | INTRAVENOUS | Status: AC | PRN
  Administered 2017-12-14: 80 mL via INTRAVENOUS

## 2017-12-16 ENCOUNTER — Encounter: Payer: Self-pay | Admitting: Gynecologic Oncology

## 2017-12-16 ENCOUNTER — Inpatient Hospital Stay: Payer: Medicare PPO | Attending: Gynecologic Oncology | Admitting: Gynecologic Oncology

## 2017-12-16 VITALS — BP 132/76 | HR 73 | Temp 98.0°F | Resp 20 | Ht 63.0 in | Wt 111.4 lb

## 2017-12-16 DIAGNOSIS — Z90710 Acquired absence of both cervix and uterus: Secondary | ICD-10-CM | POA: Diagnosis not present

## 2017-12-16 DIAGNOSIS — C569 Malignant neoplasm of unspecified ovary: Secondary | ICD-10-CM | POA: Diagnosis present

## 2017-12-16 DIAGNOSIS — Z5111 Encounter for antineoplastic chemotherapy: Secondary | ICD-10-CM | POA: Diagnosis not present

## 2017-12-16 DIAGNOSIS — C786 Secondary malignant neoplasm of retroperitoneum and peritoneum: Secondary | ICD-10-CM | POA: Diagnosis not present

## 2017-12-16 DIAGNOSIS — Z9221 Personal history of antineoplastic chemotherapy: Secondary | ICD-10-CM | POA: Diagnosis not present

## 2017-12-16 DIAGNOSIS — J910 Malignant pleural effusion: Secondary | ICD-10-CM | POA: Diagnosis not present

## 2017-12-16 DIAGNOSIS — F17210 Nicotine dependence, cigarettes, uncomplicated: Secondary | ICD-10-CM

## 2017-12-16 DIAGNOSIS — D6481 Anemia due to antineoplastic chemotherapy: Secondary | ICD-10-CM | POA: Diagnosis not present

## 2017-12-16 DIAGNOSIS — G620 Drug-induced polyneuropathy: Secondary | ICD-10-CM | POA: Diagnosis not present

## 2017-12-16 DIAGNOSIS — I10 Essential (primary) hypertension: Secondary | ICD-10-CM | POA: Diagnosis not present

## 2017-12-16 DIAGNOSIS — Z7689 Persons encountering health services in other specified circumstances: Secondary | ICD-10-CM | POA: Insufficient documentation

## 2017-12-16 DIAGNOSIS — T451X5A Adverse effect of antineoplastic and immunosuppressive drugs, initial encounter: Secondary | ICD-10-CM | POA: Insufficient documentation

## 2017-12-16 DIAGNOSIS — Z90722 Acquired absence of ovaries, bilateral: Secondary | ICD-10-CM | POA: Diagnosis not present

## 2017-12-16 DIAGNOSIS — C482 Malignant neoplasm of peritoneum, unspecified: Secondary | ICD-10-CM

## 2017-12-16 DIAGNOSIS — J018 Other acute sinusitis: Secondary | ICD-10-CM | POA: Insufficient documentation

## 2017-12-16 NOTE — Patient Instructions (Addendum)
Preparing for your Surgery  Plan for surgery on January 27, 2018 with Dr. [name] at [facility] will be scheduled for a robotic assisted bilateral salpingo-oophorectomy, omentectomy, debulking, possible laparotomy, and other indicated procedures.  Pre-operative Testing -You will receive a phone call from presurgical testing at [facility] to arrange for a pre-operative testing appointment before your surgery.  This appointment normally occurs one to two weeks before your scheduled surgery.   -Bring your insurance card, copy of an advanced directive if applicable, medication list  -At that visit, you will be asked to sign a consent for a possible blood transfusion in case a transfusion becomes necessary during surgery.  The need for a blood transfusion is rare but having consent is a necessary part of your care.     -You should not be taking blood thinners or ASPIRIN at least ten days prior to surgery unless instructed by your surgeon.  Day Before Surgery at [place] will be asked to take in a light diet the day before surgery.  Avoid carbonated beverages.  You will be advised to have nothing to eat or drink after midnight the evening before.    Eat a light diet the day before surgery.  Examples including soups, broths, toast, yogurt, mashed potatoes.  Things to avoid include carbonated beverages (fizzy beverages), raw fruits and raw vegetables, or beans.   If your bowels are filled with gas, your surgeon will have difficulty visualizing your pelvic organs which increases your surgical risks.  **Starting at 4pm the day before surgery, begin drinking one bottle of magnesium citrate and only take in clear liquids after that time.**  Your role in recovery Your role is to become active as soon as directed by your doctor, while still giving yourself time to heal.  Rest when you feel tired. You will be asked to do the following in order to speed your  recovery:  - Cough and breathe deeply. This helps toclear and expand your lungs and can prevent pneumonia. You may be given a spirometer to practice deep breathing. A staff member will show you how to use the spirometer. - Do mild physical activity. Walking or moving your legs help your circulation and body functions return to normal. A staff member will help you when you try to walk and will provide you with simple exercises. Do not try to get up or walk alone the first time. - Actively manage your pain. Managing your pain lets you move in comfort. We will ask you to rate your pain on a scale of zero to 10. It is your responsibility to tell your doctor or nurse where and how much you hurt so your pain can be treated.  Special Considerations -If you are diabetic, you may be placed on insulin after surgery to have closer control over your blood sugars to promote healing and recovery.  This does not mean that you will be discharged on insulin.  If applicable, your oral antidiabetics will be resumed when you are tolerating a solid diet.  -Your final pathology results from surgery should be available by the Friday after surgery and the results will be relayed to you when available.  -Dr. Lahoma Crocker is the Surgeon that assists your GYN Oncologist with surgery.  The next day after your surgery you will either see your GYN Oncologist or Dr. Lahoma Crocker.   Blood Transfusion Information WHAT IS A BLOOD TRANSFUSION? A transfusion is the replacement of blood or  some of its parts. Blood is made up of multiple cells which provide different functions.  Red blood cells carry oxygen and are used for blood loss replacement.  White blood cells fight against infection.  Platelets control bleeding.  Plasma helps clot blood.  Other blood products are available for specialized needs, such as hemophilia or other clotting disorders. BEFORE THE TRANSFUSION  Who gives blood for transfusions?   You  may be able to donate blood to be used at a later date on yourself (autologous donation).  Relatives can be asked to donate blood. This is generally not any safer than if you have received blood from a stranger. The same precautions are taken to ensure safety when a relative's blood is donated.  Healthy volunteers who are fully evaluated to make sure their blood is safe. This is blood bank blood. Transfusion therapy is the safest it has ever been in the practice of medicine. Before blood is taken from a donor, a complete history is taken to make sure that person has no history of diseases nor engages in risky social behavior (examples are intravenous drug use or sexual activity with multiple partners). The donor's travel history is screened to minimize risk of transmitting infections, such as malaria. The donated blood is tested for signs of infectious diseases, such as HIV and hepatitis. The blood is then tested to be sure it is compatible with you in order to minimize the chance of a transfusion reaction. If you or a relative donates blood, this is often done in anticipation of surgery and is not appropriate for emergency situations. It takes many days to process the donated blood. RISKS AND COMPLICATIONS Although transfusion therapy is very safe and saves many lives, the main dangers of transfusion include:   Getting an infectious disease.  Developing a transfusion reaction. This is an allergic reaction to something in the blood you were given. Every precaution is taken to prevent this. The decision to have a blood transfusion has been considered carefully by your caregiver before blood is given. Blood is not given unless the benefits outweigh the risks.

## 2017-12-16 NOTE — Progress Notes (Signed)
GYN ONCOLOGY OFFICE VISIT  Referring Clinician:  Dr. Kayleen Memos  CC:  Chief Complaint  Patient presents with  . Malignant neoplasm of ovary, unspecified laterality (Jo Parker)    HPI: [name]  is a very nice 72 y.o.  P3  March 2019 she had a abdominal ultrasound for her history of aortic aneurysm there was an incidental finding of a cirrhotic appearing liver and ascites.  Initially treated with diuretics but after lack of improvement further work-up was performed including a CT scan of the abdomen and pelvis.  As noted below etiology was significant carcinomatosis with peritoneal thickening sigmoid colon with significant thickening new pancreatic cyst compared to a 2018 scan and omental caking.  Seen by medical oncology [place] which is where she lives.  Paracentesis was ordered along with tumor markers including CA 125 which was noted to be elevated CA-19-9 which was mildly elevated normal being 35 the cytology on the paracentesis was positive for adenocarcinoma IHC staining suggested a GYN primary CK 7 and Ca1 25 being positive then CEA CK 20 and CA-19-9 being negative.  Pleural effusion was positive for adenocarcinoma   Recent Labs    10/15/17 1009 11/03/17 1056 11/29/17 1101  CAN125 775.9* 991.9* 133.0*    CA125 09/13/17 = 597.9   Mammography December 2018 within normal limits Colonoscopy 2 years ago within normal limits   Oncologic History: as in HPI     Primary peritoneal carcinomatosis ([place])   08/18/2017 Imaging    US imaging showed scular AAA measured 3.8 cm, liver nodularity with ascites and pleural effusions      09/03/2017 Imaging    CT chest elsewhere showed small bilateral pleural effusion      09/13/2017 Pathology Results    Fluid cytology positive for adenocarcinoma, strongly positive for CK7, CA-125, moderate to strong MOC-31 and B72.3. CK20, CEA and Ca19-9 were negative      09/16/2017 Imaging    CT abdomen and pelvis elsewhere showed  small bilateral pleural effusion, cardiomegaly, 2.6 cm ovoid cystic mass at the pancreatic tail, 3.5 cm saccular aneurysm, no retroperitoneal lymphadenopathy, marked sigmoid wall thickening without overt mechanical bowel obstruction, marked omental thickening and ascites.      10/08/2017 Procedure    Extensive omental and peritoneal tumor is present anteriorly deep to the abdominal wall. From a midline approach, tumor was sampled just to the left of midline in the upper pelvis, below the level of the umbilicus. Solid tissue was obtained.  IMPRESSION: CT-guided core biopsy performed of omental/peritoneal tumor in the anterior peritoneal cavity.      10/08/2017 Pathology Results    Soft Tissue Needle Core Biopsy, Omentum/Peritoneal Cavity - POORLY DIFFERENTIATED CARCINOMA. Microscopic Comment Immunohistochemistry will be performed and reported as an addendum. Dr. [name] agrees. Called to [facility] on 10/11/17. (JDP:kh 10/11/17) ADDENDUM: Immunohistochemistry shows the tumor is positive with cytokeratin AE1/AE3, cytokeratin 7, estrogen receptor, WT-1, and p16. The tumor is negative with CD56, chromogranin, synaptophysin, CDX-2, cytokeratin 20, progesterone receptor, GATA-3, GCDFP, Napsin-A and thyroid transcription factor-1. 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 The catheter is  ready for use.      10/14/2017 Procedure    A total of approximately 1300 mL of ascitic fluid was removed.      10/15/2017 Tumor Marker    Patient's tumor was tested for the following markers: CA-125 Results of the tumor marker test  revealed 775.9      10/18/2017 -  Chemotherapy    The patient had carboplatin and Taxol      10/22/2017 - 10/27/2017 Hospital Admission    She was admitted to the hospital for evaluation of shortness of breath and was found to have large pleural effusion, successfully removed and cytology was malignant.  Sepsis was ruled out.      10/23/2017 Imaging    No evidence of pulmonary embolism.  New moderate size left effusion and new small to moderate right effusion with associated bibasilar atelectasis.  Known extensive peritoneal carcinomatosis and moderate ascites.  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 Recommend correlation with prior exams and vascular surgery consultation.  2.1 cm left renal cyst.  Mild cardiomegaly and minimal atherosclerotic coronary artery disease.  Aortic Atherosclerosis (ICD10-I70.0) and Emphysema (ICD10-J43.9).      10/24/2017 Pathology Results    PLEURAL FLUID, LEFT (SPECIMEN 1 OF 1 COLLECTED 10/24/17): MALIGNANT CELLS CONSISTENT WITH METASTATIC ADENOCARCINOMA. SEE COMMENT.      10/24/2017 Procedure    Successful ultrasound guided left thoracentesis yielding 580 mL of pleural fluid.  Follow-up chest radiograph shows no pneumothorax      11/03/2017 Tumor Marker    Patient's tumor was tested for the following markers: CA-125 Results of the tumor marker test revealed 991.9      11/29/2017 Tumor Marker    Patient's tumor was tested for the following markers: CA-125 Results of the tumor marker test revealed 133      12/14/2017 Imaging    Decreased peritoneal carcinomatosis, and near complete resolution of ascites since prior exam.  No new or progressive disease within the abdomen or pelvis.  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       Current Meds:  Outpatient Encounter Medications as of 12/16/2017  Medication Sig  . aspirin EC 81 MG tablet Take 81 mg by mouth daily.  Marland Kitchen atorvastatin  (LIPITOR) 80 MG tablet Take 80 mg by mouth daily.  . budesonide-formoterol (SYMBICORT) 160-4.5 MCG/ACT inhaler Inhale 2 puffs into the lungs 2 (two) times daily. Use two puffs by mouth twice daily(rinse mouth after use)  . carvedilol (COREG) 12.5 MG tablet Take 12.5 mg by mouth 2 (two) times daily with a meal.  . citalopram (CELEXA) 20 MG tablet Take 1 tablet (20 mg total) by mouth daily.  Marland Kitchen dexamethasone (DECADRON) 4 MG tablet Take 5 tabs the night before and 5 tabs the Parker of chemotherapy, every 3 weeks with food  . HYDROcodone-acetaminophen (NORCO) 10-325 MG tablet Take 1 tablet by mouth every 4 (four) hours as needed for moderate pain or severe pain.  Marland Kitchen lidocaine-prilocaine (EMLA) cream Apply to affected area once  . meclizine (ANTIVERT) 25 MG tablet Take 25 mg by mouth 3 (three) times daily as needed for dizziness.  . Melatonin 5 MG CAPS Take 2 capsules by mouth at bedtime as needed (sleep). Take 2 capsules at bedtime as needed  . mirtazapine (REMERON) 30 MG tablet Take 1 tablet (30 mg total) by mouth at bedtime.  . ondansetron (ZOFRAN) 4 MG tablet Take 4 mg by mouth every 6 (six) hours as needed for nausea or vomiting.  Marland Kitchen  polyethylene glycol (MIRALAX / GLYCOLAX) packet Take 17 g by mouth daily.  . prochlorperazine (COMPAZINE) 10 MG tablet Take 1 tablet (10 mg total) by mouth every 6 (six) hours as needed (Nausea or vomiting).   No facility-administered encounter medications on file as of 12/16/2017.     Allergy: No Known Allergies  Social Hx:   Social History   Socioeconomic History  . Marital status: Married    Spouse name: Not on file  . Number of children: 3  . Years of education: Not on file  . Highest education level: Not on file  Occupational History  . Occupation: retired  Scientific laboratory technician  . Financial resource strain: Not on file  . Food insecurity:    Worry: Not on file    Inability: Not on file  . Transportation needs:    Medical: Not on file    Non-medical: Not on  file  Tobacco Use  . Smoking status: Current Some Day Smoker    Packs/day: 1.00    Years: 55.00    Pack years: 55.00  . Smokeless tobacco: Never Used  Substance and Sexual Activity  . Alcohol use: Yes    Comment: 1 / week  . Drug use: Never  . Sexual activity: Not Currently  Lifestyle  . Physical activity:    Days per week: Not on file    Minutes per session: Not on file  . Stress: Not on file  Relationships  . Social connections:    Talks on phone: Not on file    Gets together: Not on file    Attends religious service: Not on file    Active member of club or organization: Not on file    Attends meetings of clubs or organizations: Not on file    Relationship status: Not on file  . Intimate partner violence:    Fear of current or ex partner: Not on file    Emotionally abused: Not on file    Physically abused: Not on file    Forced sexual activity: Not on file  Other Topics Concern  . Not on file  Social History Narrative  . Not on file    Past Surgical Hx:  Past Surgical History:  Procedure Laterality Date  . ABDOMINAL HYSTERECTOMY    . cardiac monitor implant    . CEREBRAL ANEURYSM REPAIR    . CERVICAL SPINE SURGERY     bone spurs  . IR FLUORO GUIDE PORT INSERTION RIGHT  10/14/2017  . IR PARACENTESIS  10/14/2017  . IR US GUIDE VASC ACCESS RIGHT  10/14/2017  . PARTIAL HYSTERECTOMY  1971   "precancer"    Past Medical Hx:  Past Medical History:  Diagnosis Date  . Anxiety and depression   . Aortic aneurysm ([place])   . Arthritis   . Carcinomatosis ([place]) 10/2017  . COPD (chronic obstructive pulmonary disease) ([place]) 10/23/2017  . History of diverticulitis   . HLD (hyperlipidemia) 10/23/2017  . Hypercholesterolemia   . Hypertension   . Mitral valve prolapse   . Uterine cancer ([place])    had partial hysterectomy at 27    Past Gynecological History:   GYNECOLOGIC HISTORY:  No LMP recorded. Patient has had a hysterectomy.  For precancer Menarche: 72 years old P 3 LMP  1977 Contraceptive OCP x8 years HRT none  Last Pap N/A  Family Hx:  Family History  Problem Relation Age of Onset  . Endometrial cancer Sister 33       endometrial ca  .  Lung cancer Paternal Aunt   . Breast cancer Paternal Aunt   . Breast cancer Paternal Aunt   . Stomach cancer Maternal Grandfather   . Prostate cancer Paternal Uncle   . Breast cancer Cousin   . Breast cancer Cousin   . Lung cancer Father        lung ca    Review of Systems:  Review of Systems  Constitutional: Positive for appetite change and fatigue.  HENT:   Negative for tinnitus and trouble swallowing.   Eyes: Negative.   Respiratory: Negative for chest tightness, cough, shortness of breath and wheezing.   Gastrointestinal: Negative for abdominal distention, abdominal pain, constipation, nausea and vomiting.  Endocrine: Negative.   Genitourinary: Negative for pelvic pain, vaginal bleeding and vaginal discharge.   Musculoskeletal: Negative for back pain.  Skin: Negative.   Neurological: Negative.   Hematological: Negative.   Psychiatric/Behavioral: Positive for depression. Negative for confusion. The patient is not nervous/anxious.    Physical Exam: BP 132/76 (BP Location: Left Arm, Patient Position: Sitting)   Pulse 73   Temp 98 F (36.7 C) (Oral)   Resp 20   Ht 5\' 3"  (1.6 m)   Wt 111 lb 6.4 oz (50.5 kg)   SpO2 100%   BMI 19.73 kg/m   General :   72 y.o., female in no apparent distress HEENT:  Normocephalic/atraumatic, symmetric, EOMI, eyelids normal Neck:   Supple, no masses.  Lymphatics:  No cervical/ submandibular/ supraclavicular/ infraclavicular/ inguinal adenopathy Respiratory:  Respirations unlabored, no use of accessory muscles, chest clear to auscultation CV:   Regular rate and rhythm musculoskeletal: No CVA tenderness, normal muscle strength. Abdomen:  Soft, non-tender,  No evidence of hernia. No masses.  No ascites Extremities:  No lymphedema, no erythema, non-tender. Skin:    Normal inspection Neuro/Psych:  No focal motor deficit, no abnormal mental status. Normal gait. Normal affect. Alert and oriented to person, place, and time  Genito Urinary: Vulva: Normal external female genitalia.  Bladder/urethra: Urethral meatus normal in size and location. No lesions or   masses, well supported bladder Bimanual exam:  Cervix/Uterus: surgically absent  Adnexa: No masses. Rectovaginal:  Good tone, no palpable rectal nodularity, no cul-de-sac nodularity   Oncologic Summary: 1. 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  At initial presentation large volume ascites and positive pleural cytology was appreciated in addition to rectal wall nodularity.  Jo Parker has completed 3 cycles of paclitaxel carboplatin with reduction in Ca1 25-133.  Cycle #4 scheduled for December 20, 2017 with plan for robotic assisted bilateral salpingo-oophorectomy omentectomy interval debulking on January 27, 2018.  The risks of the procedure were discussed with Jo Parker and her family were that of infection bleeding damage to surrounding structures prolonged hospitalization and reoperation.  They understand that the procedure may be converted to exploratory laparotomy, and that a mini laparotomy will be required for specimen removal and perhaps completion of the omentectomy.   2. Discontinue aspirin use 10 days prior to surgery 3.   Will refer for genetic testing so that we can determine whether or not Ms. Parker is a candidate for PARPi versus bevacizumab maintenance given the high likelihood of recurrence from the stage IV disease.  Maintenance therapy and high risk for recurrence was discussed with the patient and her family  Assessment/Plan: Jo Morning, MD  12/16/2017, 10:12 AM  Cc: Toney Reil, MD, [place]

## 2017-12-17 ENCOUNTER — Encounter: Payer: Self-pay | Admitting: Oncology

## 2017-12-17 DIAGNOSIS — C482 Malignant neoplasm of peritoneum, unspecified: Secondary | ICD-10-CM

## 2017-12-20 ENCOUNTER — Inpatient Hospital Stay: Payer: Medicare PPO

## 2017-12-20 ENCOUNTER — Encounter: Payer: Self-pay | Admitting: Oncology

## 2017-12-20 ENCOUNTER — Other Ambulatory Visit: Payer: Self-pay | Admitting: Hematology and Oncology

## 2017-12-20 ENCOUNTER — Encounter: Payer: Self-pay | Admitting: Hematology and Oncology

## 2017-12-20 ENCOUNTER — Inpatient Hospital Stay: Payer: Medicare PPO | Admitting: Nutrition

## 2017-12-20 ENCOUNTER — Inpatient Hospital Stay (HOSPITAL_BASED_OUTPATIENT_CLINIC_OR_DEPARTMENT_OTHER): Payer: Medicare PPO | Admitting: Hematology and Oncology

## 2017-12-20 DIAGNOSIS — G620 Drug-induced polyneuropathy: Secondary | ICD-10-CM | POA: Diagnosis not present

## 2017-12-20 DIAGNOSIS — J018 Other acute sinusitis: Secondary | ICD-10-CM | POA: Diagnosis not present

## 2017-12-20 DIAGNOSIS — J019 Acute sinusitis, unspecified: Secondary | ICD-10-CM | POA: Insufficient documentation

## 2017-12-20 DIAGNOSIS — C482 Malignant neoplasm of peritoneum, unspecified: Secondary | ICD-10-CM

## 2017-12-20 DIAGNOSIS — D6481 Anemia due to antineoplastic chemotherapy: Secondary | ICD-10-CM

## 2017-12-20 DIAGNOSIS — Z5111 Encounter for antineoplastic chemotherapy: Secondary | ICD-10-CM | POA: Diagnosis not present

## 2017-12-20 DIAGNOSIS — C569 Malignant neoplasm of unspecified ovary: Secondary | ICD-10-CM | POA: Diagnosis not present

## 2017-12-20 DIAGNOSIS — I10 Essential (primary) hypertension: Secondary | ICD-10-CM | POA: Insufficient documentation

## 2017-12-20 DIAGNOSIS — T451X5A Adverse effect of antineoplastic and immunosuppressive drugs, initial encounter: Secondary | ICD-10-CM

## 2017-12-20 LAB — CBC WITH DIFFERENTIAL (CANCER CENTER ONLY)
Basophils Absolute: 0 10*3/uL (ref 0.0–0.1)
Basophils Relative: 0 %
EOS ABS: 0 10*3/uL (ref 0.0–0.5)
Eosinophils Relative: 0 %
HCT: 30.4 % — ABNORMAL LOW (ref 34.8–46.6)
HEMOGLOBIN: 9.7 g/dL — AB (ref 11.6–15.9)
LYMPHS ABS: 0.7 10*3/uL — AB (ref 0.9–3.3)
LYMPHS PCT: 13 %
MCH: 21.5 pg — AB (ref 25.1–34.0)
MCHC: 31.9 g/dL (ref 31.5–36.0)
MCV: 67.4 fL — ABNORMAL LOW (ref 79.5–101.0)
MONOS PCT: 0 %
Monocytes Absolute: 0 10*3/uL — ABNORMAL LOW (ref 0.1–0.9)
NEUTROS PCT: 87 %
Neutro Abs: 4.6 10*3/uL (ref 1.5–6.5)
Platelet Count: 191 10*3/uL (ref 145–400)
RBC: 4.51 MIL/uL (ref 3.70–5.45)
RDW: 25.8 % — ABNORMAL HIGH (ref 11.2–14.5)
WBC: 5.3 10*3/uL (ref 3.9–10.3)

## 2017-12-20 LAB — CMP (CANCER CENTER ONLY)
ALK PHOS: 138 U/L — AB (ref 38–126)
ALT: 30 U/L (ref 0–44)
ANION GAP: 9 (ref 5–15)
AST: 25 U/L (ref 15–41)
Albumin: 3.8 g/dL (ref 3.5–5.0)
BILIRUBIN TOTAL: 0.4 mg/dL (ref 0.3–1.2)
BUN: 17 mg/dL (ref 8–23)
CALCIUM: 9.1 mg/dL (ref 8.9–10.3)
CO2: 25 mmol/L (ref 22–32)
CREATININE: 0.76 mg/dL (ref 0.44–1.00)
Chloride: 106 mmol/L (ref 98–111)
GFR, Estimated: >60 mL/min (ref >60)
Glucose, Bld: 196 mg/dL — ABNORMAL HIGH (ref 70–99)
Potassium: 3.9 mmol/L (ref 3.5–5.1)
SODIUM: 140 mmol/L (ref 135–145)
Total Protein: 7.2 g/dL (ref 6.5–8.1)

## 2017-12-20 MED ORDER — SODIUM CHLORIDE 0.9 % IV SOLN
140.0000 mg/m2 | Freq: Once | INTRAVENOUS | Status: AC
  Administered 2017-12-20: 216 mg via INTRAVENOUS
  Filled 2017-12-20: qty 36

## 2017-12-20 MED ORDER — SODIUM CHLORIDE 0.9 % IV SOLN
381.0000 mg | Freq: Once | INTRAVENOUS | Status: AC
  Administered 2017-12-20: 380 mg via INTRAVENOUS
  Filled 2017-12-20: qty 38

## 2017-12-20 MED ORDER — SODIUM CHLORIDE 0.9% FLUSH
10.0000 mL | INTRAVENOUS | Status: DC | PRN
  Administered 2017-12-20: 10 mL
  Filled 2017-12-20: qty 10

## 2017-12-20 MED ORDER — DIPHENHYDRAMINE HCL 50 MG/ML IJ SOLN
INTRAMUSCULAR | Status: AC
  Filled 2017-12-20: qty 1

## 2017-12-20 MED ORDER — PALONOSETRON HCL INJECTION 0.25 MG/5ML
0.2500 mg | Freq: Once | INTRAVENOUS | Status: AC
  Administered 2017-12-20: 0.25 mg via INTRAVENOUS

## 2017-12-20 MED ORDER — FAMOTIDINE IN NACL 20-0.9 MG/50ML-% IV SOLN
INTRAVENOUS | Status: AC
  Filled 2017-12-20: qty 50

## 2017-12-20 MED ORDER — SODIUM CHLORIDE 0.9% FLUSH
10.0000 mL | Freq: Once | INTRAVENOUS | Status: AC
  Administered 2017-12-20: 10 mL
  Filled 2017-12-20: qty 10

## 2017-12-20 MED ORDER — FAMOTIDINE IN NACL 20-0.9 MG/50ML-% IV SOLN
20.0000 mg | Freq: Once | INTRAVENOUS | Status: AC
  Administered 2017-12-20: 20 mg via INTRAVENOUS

## 2017-12-20 MED ORDER — FOSAPREPITANT DIMEGLUMINE INJECTION 150 MG
Freq: Once | INTRAVENOUS | Status: AC
  Administered 2017-12-20: 12:00:00 via INTRAVENOUS
  Filled 2017-12-20: qty 5

## 2017-12-20 MED ORDER — TRIAMCINOLONE ACETONIDE 55 MCG/ACT NA AERO: 1.0000 | INHALATION_SPRAY | Freq: Two times a day (BID) | NASAL | 12 refills | Status: DC

## 2017-12-20 MED ORDER — DIPHENHYDRAMINE HCL 50 MG/ML IJ SOLN
50.0000 mg | Freq: Once | INTRAMUSCULAR | Status: AC
  Administered 2017-12-20: 50 mg via INTRAVENOUS

## 2017-12-20 MED ORDER — HEPARIN SOD (PORK) LOCK FLUSH 100 UNIT/ML IV SOLN
500.0000 [IU] | Freq: Once | INTRAVENOUS | Status: AC | PRN
  Administered 2017-12-20: 500 [IU]
  Filled 2017-12-20: qty 5

## 2017-12-20 MED ORDER — SODIUM CHLORIDE 0.9 % IV SOLN
Freq: Once | INTRAVENOUS | Status: AC
  Administered 2017-12-20: 12:00:00 via INTRAVENOUS

## 2017-12-20 MED ORDER — PALONOSETRON HCL INJECTION 0.25 MG/5ML
INTRAVENOUS | Status: AC
Start: 2017-12-20 — End: ?
  Filled 2017-12-20: qty 5

## 2017-12-20 NOTE — Assessment & Plan Note (Signed)
Her cough is likely due to sinus allergy I recommend Nasacort

## 2017-12-20 NOTE — Progress Notes (Signed)
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 Weight increased and was documented as 112 pounds July 15 increased from 105.9 pounds May 29. Noted glucose 196. Patient reports she has a good appetite and she denies other nutrition impact symptoms. Patient has questions about food safety. She is drinking boost plus twice a day.  Nutrition diagnosis: Unintended weight loss improved.  Intervention: Patient educated to continue boost plus twice daily as tolerated. Offered coupons however patient declined. Educated patient on food safety guidelines during treatment. Questions were answered.  Teach back method used.  Contact information was given.  Monitoring, evaluation, goals: Patient will tolerate increased calories and protein to minimize weight loss.  Next visit: To be scheduled if needed.  **Disclaimer: This note was dictated with voice recognition software. Similar sounding words can inadvertently be transcribed and this note may contain transcription errors which may not have been corrected upon publication of note.**

## 2017-12-20 NOTE — Assessment & Plan Note (Signed)
She is instructed to watch her blood pressure carefully at home She stated that her blood pressure at home is well controlled Her blood pressure is elevated here likely due to whitecoat hypertension I recommend close observation only

## 2017-12-20 NOTE — Progress Notes (Signed)
[place] OFFICE PROGRESS NOTE  Patient Care Team: [name], MD as PCP - General (Hematology and Oncology)  ASSESSMENT & PLAN:  Primary peritoneal carcinomatosis ([place]) Clinically, she has responded beautifully to treatment CT scan showed disease control and tumor markers are improving She has visited with GYN oncologist with plan for surgery next month We will proceed with treatment today and I plan to see her back within the month after surgery for further follow-up She understood she will likely need more chemotherapy in the future  Peripheral neuropathy due to chemotherapy [facility]) she has mild peripheral neuropathy, likely related to side effects of treatment. 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 The patient denies recent history of bleeding such as epistaxis, hematuria or hematochezia. She is asymptomatic from the anemia. We will observe for now.  She does not require transfusion now.    White coat syndrome with hypertension She is instructed to watch her blood pressure carefully at home She stated that her blood pressure at home is well controlled Her blood pressure is elevated here likely due to whitecoat hypertension I recommend close observation only  Sinusitis, acute Her cough is likely due to sinus allergy I recommend Nasacort   No orders of the defined types were placed in this encounter.   INTERVAL HISTORY: Please see below for problem oriented charting. She returns for further follow-up with family She returns here for cycle 4 of chemotherapy With last cycle of treatment, she has developed some mild peripheral neuropathy It does not cause significant pain She denies recent infection, fever or chills She has no further recurrence  of ascites Or pleural effusion She has intermittent cough but is generally nonproductive.  She did complain of nasal drainage She has not smoked recently She is gaining healthy weight She denies taking pain medicine as her cancer pain has resolved.  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      10/08/2017 Procedure    Extensive omental and peritoneal tumor is present anteriorly deep to the abdominal wall. From a midline approach, tumor was sampled just to the left of midline in the upper pelvis, below the level of the umbilicus. Solid tissue was obtained.  IMPRESSION: CT-guided core biopsy performed of omental/peritoneal tumor in the anterior peritoneal cavity.      10/08/2017 Pathology Results    Soft Tissue Needle Core Biopsy, Omentum/Peritoneal Cavity - POORLY DIFFERENTIATED CARCINOMA. Microscopic Comment Immunohistochemistry will be performed and reported as an addendum. Dr. [name] agrees. Called to [facility] on 10/11/17. (  JDP:kh 10/11/17) ADDENDUM: Immunohistochemistry shows the tumor is positive with cytokeratin AE1/AE3, cytokeratin 7, estrogen receptor, WT-1, and p16. The tumor is negative with CD56,  chromogranin, synaptophysin, CDX-2, cytokeratin 20, progesterone receptor, GATA-3, GCDFP, Napsin-A and thyroid transcription factor-1. 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 The catheter is ready for use.      10/14/2017 Procedure    A total of approximately 1300 mL of ascitic fluid was removed.      10/15/2017 Tumor Marker    Patient's tumor was tested for the following markers: CA-125 Results of the tumor marker test revealed 775.9      10/18/2017 -  Chemotherapy    The patient had carboplatin and Taxol      10/22/2017 - 10/27/2017 Hospital Admission    She was admitted to the hospital for evaluation of shortness of breath and was found to have large pleural effusion, successfully removed and cytology was malignant.  Sepsis was ruled out.      10/23/2017 Imaging    No evidence of pulmonary embolism.  New moderate size left effusion and new small to moderate right effusion with associated bibasilar atelectasis.  Known extensive peritoneal carcinomatosis and moderate ascites.  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 Recommend correlation with prior exams and vascular surgery consultation.  2.1 cm left renal cyst.  Mild cardiomegaly and minimal atherosclerotic coronary artery disease.  Aortic Atherosclerosis (ICD10-I70.0) and Emphysema (ICD10-J43.9).      10/24/2017 Pathology Results    PLEURAL FLUID, LEFT (SPECIMEN 1 OF 1 COLLECTED 10/24/17): MALIGNANT CELLS CONSISTENT WITH METASTATIC ADENOCARCINOMA. SEE COMMENT.      10/24/2017 Procedure    Successful ultrasound guided  left thoracentesis yielding 580 mL of pleural fluid.  Follow-up chest radiograph shows no pneumothorax      11/03/2017 Tumor Marker    Patient's tumor was tested for the following markers: CA-125 Results of the tumor marker test revealed 991.9      11/29/2017 Tumor Marker    Patient's tumor was tested for the following markers: CA-125 Results of the tumor marker test revealed 133      12/14/2017 Imaging    Decreased peritoneal carcinomatosis, and near complete resolution of ascites since prior exam.  No new or progressive disease within the abdomen or pelvis.  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       REVIEW OF SYSTEMS:   Constitutional: Denies fevers, chills or abnormal weight loss Eyes: Denies blurriness of vision Ears, nose, mouth, throat, and face: Denies mucositis or sore throat Cardiovascular: Denies palpitation, chest discomfort or lower extremity swelling Gastrointestinal:  Denies nausea, heartburn or change in bowel habits Skin: Denies abnormal skin rashes Lymphatics: Denies new lymphadenopathy or easy bruising Neurological:Denies numbness, tingling or new weaknesses Behavioral/Psych: Mood is stable, no new changes  All other systems were reviewed with the patient and are negative.  I have reviewed the past medical history, past surgical history, social history and family history with the patient and they are unchanged from previous note.  ALLERGIES:  has No Known Allergies.  MEDICATIONS:  Current Outpatient  Medications  Medication Sig Dispense Refill  . aspirin EC 81 MG tablet Take 81 mg by mouth daily.    Marland Kitchen atorvastatin (LIPITOR) 80 MG tablet Take 80 mg by mouth daily.    . budesonide-formoterol (SYMBICORT) 160-4.5 MCG/ACT inhaler Inhale 2 puffs into the lungs 2 (two) times daily. Use two puffs by mouth twice daily(rinse mouth after use)    . carvedilol (COREG) 12.5 MG tablet Take 12.5 mg by mouth 2 (two) times daily with  a meal.    . citalopram (CELEXA) 20 MG tablet Take 1 tablet (20 mg total) by mouth daily. 30 tablet 0  . HYDROcodone-acetaminophen (NORCO) 10-325 MG tablet Take 1 tablet by mouth every 4 (four) hours as needed for moderate pain or severe pain. 60 tablet 0  . meclizine (ANTIVERT) 25 MG tablet Take 25 mg by mouth 3 (three) times daily as needed for dizziness.    . Melatonin 5 MG CAPS Take 2 capsules by mouth at bedtime as needed (sleep). Take 2 capsules at bedtime as needed    . mirtazapine (REMERON) 30 MG tablet Take 1 tablet (30 mg total) by mouth at bedtime. 90 tablet 1  . ondansetron (ZOFRAN) 4 MG tablet Take 4 mg by mouth every 6 (six) hours as needed for nausea or vomiting.    . polyethylene glycol (MIRALAX / GLYCOLAX) packet Take 17 g by mouth daily. 14 each 0  . prochlorperazine (COMPAZINE) 10 MG tablet Take 1 tablet (10 mg total) by mouth every 6 (six) hours as needed (Nausea or vomiting). 30 tablet 1  . triamcinolone (NASACORT) 55 MCG/ACT AERO nasal inhaler Place 1 spray into the nose 2 (two) times daily. 1 Inhaler 12   No current facility-administered medications for this visit.     PHYSICAL EXAMINATION: ECOG PERFORMANCE STATUS: 1 - Symptomatic but completely ambulatory  Vitals:   12/20/17 1029  BP: (!) 161/86  Pulse: 91  Resp: 18  Temp: 97.8 F (36.6 C)  SpO2: 100%   Filed Weights   12/20/17 1029  Weight: 112 lb (50.8 kg)    GENERAL:alert, no distress and comfortable SKIN: skin color, texture, turgor are normal, no rashes or significant lesions EYES: normal, Conjunctiva are pink and non-injected, sclera clear OROPHARYNX:no exudate, no erythema and lips, buccal mucosa, and tongue normal  NECK: supple, thyroid normal size, non-tender, without nodularity LYMPH:  no palpable lymphadenopathy in the cervical, axillary or inguinal LUNGS: clear to auscultation and percussion with normal breathing effort HEART: regular rate & rhythm and no murmurs and no lower extremity  edema ABDOMEN:abdomen soft, non-tender and normal bowel sounds Musculoskeletal:no cyanosis of digits and no clubbing  NEURO: alert & oriented x 3 with fluent speech, no focal motor/sensory deficits  LABORATORY DATA:  I have reviewed the data as listed    Component Value Date/Time   NA 140 12/20/2017 0947   K 3.9 12/20/2017 0947   CL 106 12/20/2017 0947   CO2 25 12/20/2017 0947   GLUCOSE 196 (H) 12/20/2017 0947   BUN 17 12/20/2017 0947   CREATININE 0.76 12/20/2017 0947   CALCIUM 9.1 12/20/2017 0947   PROT 7.2 12/20/2017 0947   ALBUMIN 3.8 12/20/2017 0947   AST 25 12/20/2017 0947   ALT 30 12/20/2017 0947   ALKPHOS 138 (H) 12/20/2017 0947   BILITOT 0.4 12/20/2017 0947   GFRNONAA >60 12/20/2017 0947   GFRAA >60 12/20/2017 0947    No results found for: SPEP, UPEP  Lab Results  Component Value Date   WBC 5.3  12/20/2017   NEUTROABS 4.6 12/20/2017   HGB 9.7 (L) 12/20/2017   HCT 30.4 (L) 12/20/2017   MCV 67.4 (L) 12/20/2017   PLT 191 12/20/2017      Chemistry      Component Value Date/Time   NA 140 12/20/2017 0947   K 3.9 12/20/2017 0947   CL 106 12/20/2017 0947   CO2 25 12/20/2017 0947   BUN 17 12/20/2017 0947   CREATININE 0.76 12/20/2017 0947      Component Value Date/Time   CALCIUM 9.1 12/20/2017 0947   ALKPHOS 138 (H) 12/20/2017 0947   AST 25 12/20/2017 0947   ALT 30 12/20/2017 0947   BILITOT 0.4 12/20/2017 0947       RADIOGRAPHIC STUDIES: I have personally reviewed the radiological images as listed and agreed with the findings in the report. Ct Abdomen Pelvis W Contrast  Result Date: 12/14/2017 CLINICAL DATA:  Follow-up metastatic ovarian carcinoma. Currently undergoing chemotherapy. Intermittent abdominal pain. EXAM: CT ABDOMEN AND PELVIS WITH CONTRAST TECHNIQUE: Multidetector CT imaging of the abdomen and pelvis was performed using the standard protocol following bolus administration of intravenous contrast. CONTRAST:  32mL ISOVUE-300 IOPAMIDOL (ISOVUE-300)  INJECTION 61% COMPARISON:  10/23/2017 FINDINGS: Lower Chest: No acute findings. Resolution of bilateral pleural effusions since prior study. Hepatobiliary: No hepatic masses identified. Gallbladder is unremarkable. Pancreas:  No mass or inflammatory changes. Spleen: Within normal limits in size and appearance. Adrenals/Urinary Tract: No masses identified. Stable small cyst in lower pole of left kidney. No evidence of hydronephrosis. Stomach/Bowel: No evidence of obstruction, inflammatory process or abnormal fluid collections. Diverticulosis is seen involving the sigmoid colon, however there is no evidence of diverticulitis. Vascular/Lymphatic: No pathologically enlarged lymph nodes. Stable saccular aneurysm in the proximal suprarenal abdominal aorta measuring 3.9 cm. Aortic atherosclerosis. Reproductive: Prior hysterectomy. Minimal pelvic ascites which is nearly completely resolved since prior study. Significant decrease in soft tissue caking throughout the omentum and peritoneum since prior study. Other:  None. Musculoskeletal:  No suspicious bone lesions identified. IMPRESSION: Decreased peritoneal carcinomatosis, and near complete resolution of ascites since prior exam. No new or progressive disease within the abdomen or pelvis. 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 Electronically Signed   By: Earle Gell M.D.   On: 12/14/2017 09:34    All questions were answered. The patient knows to call the clinic with any problems, questions or concerns. No barriers to learning was detected.  I spent 25 minutes counseling the patient face to face. The total time spent in the appointment was 30 minutes and more than 50% was on counseling and review of test results  [name], MD 12/20/2017 11:42 AM

## 2017-12-20 NOTE — Patient Instructions (Signed)
   [facility] Discharge Instructions for Patients Receiving Chemotherapy  Today you received the following chemotherapy agents Taxol and Carboplatin   To help prevent nausea and vomiting after your treatment, we encourage you to take your nausea medication as directed.    If you develop nausea and vomiting that is not controlled by your nausea medication, call the clinic.   BELOW ARE SYMPTOMS THAT SHOULD BE REPORTED IMMEDIATELY:  *FEVER GREATER THAN 100.5 F  *CHILLS WITH OR WITHOUT FEVER  NAUSEA AND VOMITING THAT IS NOT CONTROLLED WITH YOUR NAUSEA MEDICATION  *UNUSUAL SHORTNESS OF BREATH  *UNUSUAL BRUISING OR BLEEDING  TENDERNESS IN MOUTH AND THROAT WITH OR WITHOUT PRESENCE OF ULCERS  *URINARY PROBLEMS  *BOWEL PROBLEMS  UNUSUAL RASH Items with * indicate a potential emergency and should be followed up as soon as possible.  Feel free to call the clinic should you have any questions or concerns. The clinic phone number is [phone].  Please show the CHEMO ALERT CARD at check-in to the Emergency Department and triage nurse.   

## 2017-12-20 NOTE — Assessment & Plan Note (Signed)
Clinically, she has responded beautifully to treatment CT scan showed disease control and tumor markers are improving She has visited with GYN oncologist with plan for surgery next month We will proceed with treatment today and I plan to see her back within the month after surgery for further follow-up She understood she will likely need more chemotherapy in the future

## 2017-12-20 NOTE — Assessment & Plan Note (Signed)
she has mild peripheral neuropathy, likely related to side effects of treatment. I plan to reduce the dose of Taxol I explained to the patient the rationale of this strategy and reassured the patient it would not compromise the efficacy of treatment

## 2017-12-20 NOTE — Assessment & Plan Note (Signed)
This is likely anemia of chronic disease. The patient denies recent history of bleeding such as epistaxis, hematuria or hematochezia. She is asymptomatic from the anemia. We will observe for now.  She does not require transfusion now.   

## 2017-12-21 LAB — CA 125: Cancer Antigen (CA) 125: 51.5 U/mL — ABNORMAL HIGH (ref 0.0–38.1)

## 2017-12-22 ENCOUNTER — Inpatient Hospital Stay: Payer: Medicare PPO

## 2017-12-22 DIAGNOSIS — C482 Malignant neoplasm of peritoneum, unspecified: Secondary | ICD-10-CM

## 2017-12-22 DIAGNOSIS — C569 Malignant neoplasm of unspecified ovary: Secondary | ICD-10-CM | POA: Diagnosis not present

## 2017-12-22 DIAGNOSIS — Z5111 Encounter for antineoplastic chemotherapy: Secondary | ICD-10-CM | POA: Diagnosis not present

## 2017-12-22 MED ORDER — PEGFILGRASTIM-CBQV 6 MG/0.6ML ~~LOC~~ SOSY
6.0000 mg | PREFILLED_SYRINGE | Freq: Once | SUBCUTANEOUS | Status: AC
  Administered 2017-12-22: 6 mg via SUBCUTANEOUS

## 2017-12-22 MED ORDER — PEGFILGRASTIM-CBQV 6 MG/0.6ML ~~LOC~~ SOSY
PREFILLED_SYRINGE | SUBCUTANEOUS | Status: AC
Start: 2017-12-22 — End: ?
  Filled 2017-12-22: qty 0.6

## 2017-12-22 NOTE — Patient Instructions (Signed)
Pegfilgrastim injection What is this medicine? PEGFILGRASTIM (PEG fil gra stim) is a long-acting granulocyte colony-stimulating factor that stimulates the growth of neutrophils, a type of white blood cell important in the body's fight against infection. It is used to reduce the incidence of fever and infection in patients with certain types of cancer who are receiving chemotherapy that affects the bone marrow, and to increase survival after being exposed to high doses of radiation. This medicine may be used for other purposes; ask your health care provider or pharmacist if you have questions. COMMON BRAND NAME(S): Neulasta What should I tell my health care provider before I take this medicine? They need to know if you have any of these conditions: -kidney disease -latex allergy -ongoing radiation therapy -sickle cell disease -skin reactions to acrylic adhesives (On-Body Injector only) -an unusual or allergic reaction to pegfilgrastim, filgrastim, other medicines, foods, dyes, or preservatives -pregnant or trying to get pregnant -breast-feeding How should I use this medicine? This medicine is for injection under the skin. If you get this medicine at home, you will be taught how to prepare and give the pre-filled syringe or how to use the On-body Injector. Refer to the patient Instructions for Use for detailed instructions. Use exactly as directed. Tell your healthcare provider immediately if you suspect that the On-body Injector may not have performed as intended or if you suspect the use of the On-body Injector resulted in a missed or partial dose. It is important that you put your used needles and syringes in a special sharps container. Do not put them in a trash can. If you do not have a sharps container, call your pharmacist or healthcare provider to get one. Talk to your pediatrician regarding the use of this medicine in children. While this drug may be prescribed for selected conditions,  precautions do apply. Overdosage: If you think you have taken too much of this medicine contact a poison control center or emergency room at once. NOTE: This medicine is only for you. Do not share this medicine with others. What if I miss a dose? It is important not to miss your dose. Call your doctor or health care professional if you miss your dose. If you miss a dose due to an On-body Injector failure or leakage, a new dose should be administered as soon as possible using a single prefilled syringe for manual use. What may interact with this medicine? Interactions have not been studied. Give your health care provider a list of all the medicines, herbs, non-prescription drugs, or dietary supplements you use. Also tell them if you smoke, drink alcohol, or use illegal drugs. Some items may interact with your medicine. This list may not describe all possible interactions. Give your health care provider a list of all the medicines, herbs, non-prescription drugs, or dietary supplements you use. Also tell them if you smoke, drink alcohol, or use illegal drugs. Some items may interact with your medicine. What should I watch for while using this medicine? You may need blood work done while you are taking this medicine. If you are going to need a MRI, CT scan, or other procedure, tell your doctor that you are using this medicine (On-Body Injector only). What side effects may I notice from receiving this medicine? Side effects that you should report to your doctor or health care professional as soon as possible: -allergic reactions like skin rash, itching or hives, swelling of the face, lips, or tongue -dizziness -fever -pain, redness, or irritation at site   where injected -pinpoint red spots on the skin -red or dark-brown urine -shortness of breath or breathing problems -stomach or side pain, or pain at the shoulder -swelling -tiredness -trouble passing urine or change in the amount of urine Side  effects that usually do not require medical attention (report to your doctor or health care professional if they continue or are bothersome): -bone pain -muscle pain This list may not describe all possible side effects. Call your doctor for medical advice about side effects. You may report side effects to FDA at 1-800-FDA-1088. Where should I keep my medicine? Keep out of the reach of children. Store pre-filled syringes in a refrigerator between 2 and 8 degrees C (36 and 46 degrees F). Do not freeze. Keep in carton to protect from light. Throw away this medicine if it is left out of the refrigerator for more than 48 hours. Throw away any unused medicine after the expiration date. NOTE: This sheet is a summary. It may not cover all possible information. If you have questions about this medicine, talk to your doctor, pharmacist, or health care provider.  2018 Elsevier/Gold Standard (2016-05-21 12:58:03)  

## 2018-01-19 NOTE — Patient Instructions (Signed)
Jo Parker  01/19/2018   Your procedure is scheduled on: 01/27/2018   Report to [facility] Main  Entrance  Report to admitting at   [place] AM    Call this number if you have problems the morning of surgery [phone]   Remember: Do not eat food or drink liquids :After Midnight. The day before surgery eat a light diet.  Examples include: soups, broths, toast, yogurt and mashed potatoes.  Things to avoid include carbonated beverages, raw fruits and vegetables and beans.  At 4oopm the day before surgery start clear liquid diet.  At 400pm begink drinking 2 bottles of magnesium citrate.       Take these medicines the morning of surgery with A SIP OF WATER: use Inhaler as usual and bring, Carvedilol, celexa                                You may not have any metal on your body including hair pins and              piercings  Do not wear jewelry, make-up, lotions, powders or perfumes, deodorant             Do not wear nail polish.  Do not shave  48 hours prior to surgery.                 Do not bring valuables to the hospital. [place].  Contacts, dentures or bridgework may not be worn into surgery.  Leave suitcase in the car. After surgery it may be brought to your room.       Special Instructions: coughing and deep breathing exercises, leg exercises               Please read over the following fact sheets you were given: _____________________________________________________________________             NO SOLID FOOD AFTER MIDNIGHT THE NIGHT PRIOR TO SURGERY. NOTHING BY MOUTH EXCEPT CLEAR LIQUIDS UNTIL 3 HOURS PRIOR TO [place] SURGERY. PLEASE FINISH ENSURE DRINK PER SURGEON ORDER 3 HOURS PRIOR TO SCHEDULED SURGERY TIME WHICH NEEDS TO BE COMPLETED AT _____0430am_______.   CLEAR LIQUID DIET   Foods Allowed                                                                     Foods Excluded  Coffee and tea,  regular and decaf                             liquids that you cannot  Plain Jell-O in any flavor                                             see through such as: Fruit ices (not with fruit pulp)  milk, soups, orange juice  Iced Popsicles                                    All solid food Carbonated beverages, regular and diet                                    Cranberry, grape and apple juices Sports drinks like Gatorade Lightly seasoned clear broth or consume(fat free) Sugar, honey syrup  Sample Menu Breakfast                                Lunch                                     Supper Cranberry juice                    Beef broth                            Chicken broth Jell-O                                     Grape juice                           Apple juice Coffee or tea                        Jell-O                                      Popsicle                                                Coffee or tea                        Coffee or tea  _____________________________________________________________________  [facility] - Preparing for Surgery Before surgery, you can play an important role.  Because skin is not sterile, your skin needs to be as free of germs as possible.  You can reduce the number of germs on your skin by washing with CHG (chlorahexidine gluconate) soap before surgery.  CHG is an antiseptic cleaner which kills germs and bonds with the skin to continue killing germs even after washing. Please DO NOT use if you have an allergy to CHG or antibacterial soaps.  If your skin becomes reddened/irritated stop using the CHG and inform your nurse when you arrive at Short Stay. Do not shave (including legs and underarms) for at least 48 hours prior to the first CHG shower.  You may shave your face/neck. Please follow these instructions carefully:  1.  Shower with CHG Soap the night before surgery and the  morning of Surgery.  2.  If you  choose to  wash your hair, wash your hair first as usual with your  normal  shampoo.  3.  After you shampoo, rinse your hair and body thoroughly to remove the  shampoo.                           4.  Use CHG as you would any other liquid soap.  You can apply chg directly  to the skin and wash                       Gently with a scrungie or clean washcloth.  5.  Apply the CHG Soap to your body ONLY FROM THE NECK DOWN.   Do not use on face/ open                           Wound or open sores. Avoid contact with eyes, ears mouth and genitals (private parts).                       Wash face,  Genitals (private parts) with your normal soap.             6.  Wash thoroughly, paying special attention to the area where your surgery  will be performed.  7.  Thoroughly rinse your body with warm water from the neck down.  8.  DO NOT shower/wash with your normal soap after using and rinsing off  the CHG Soap.                9.  Pat yourself dry with a clean towel.            10.  Wear clean pajamas.            11.  Place clean sheets on your bed the night of your first shower and do not  sleep with pets. Day of Surgery : Do not apply any lotions/deodorants the morning of surgery.  Please wear clean clothes to the hospital/surgery center.  FAILURE TO FOLLOW THESE INSTRUCTIONS MAY RESULT IN THE CANCELLATION OF YOUR SURGERY PATIENT SIGNATURE_________________________________  NURSE SIGNATURE__________________________________  ________________________________________________________________________  WHAT IS A BLOOD TRANSFUSION? Blood Transfusion Information  A transfusion is the replacement of blood or some of its parts. Blood is made up of multiple cells which provide different functions.  Red blood cells carry oxygen and are used for blood loss replacement.  White blood cells fight against infection.  Platelets control bleeding.  Plasma helps clot blood.  Other blood products are available for  specialized needs, such as hemophilia or other clotting disorders. BEFORE THE TRANSFUSION  Who gives blood for transfusions?   Healthy volunteers who are fully evaluated to make sure their blood is safe. This is blood bank blood. Transfusion therapy is the safest it has ever been in the practice of medicine. Before blood is taken from a donor, a complete history is taken to make sure that person has no history of diseases nor engages in risky social behavior (examples are intravenous drug use or sexual activity with multiple partners). The donor's travel history is screened to minimize risk of transmitting infections, such as malaria. The donated blood is tested for signs of infectious diseases, such as HIV and hepatitis. The blood is then tested to be sure it is compatible with you in order to minimize the chance of a transfusion reaction. If you or a  relative donates blood, this is often done in anticipation of surgery and is not appropriate for emergency situations. It takes many days to process the donated blood. RISKS AND COMPLICATIONS Although transfusion therapy is very safe and saves many lives, the main dangers of transfusion include:   Getting an infectious disease.  Developing a transfusion reaction. This is an allergic reaction to something in the blood you were given. Every precaution is taken to prevent this. The decision to have a blood transfusion has been considered carefully by your caregiver before blood is given. Blood is not given unless the benefits outweigh the risks. AFTER THE TRANSFUSION  Right after receiving a blood transfusion, you will usually feel much better and more energetic. This is especially true if your red blood cells have gotten low (anemic). The transfusion raises the level of the red blood cells which carry oxygen, and this usually causes an energy increase.  The nurse administering the transfusion will monitor you carefully for complications. HOME CARE  INSTRUCTIONS  No special instructions are needed after a transfusion. You may find your energy is better. Speak with your caregiver about any limitations on activity for underlying diseases you may have. SEEK MEDICAL CARE IF:   Your condition is not improving after your transfusion.  You develop redness or irritation at the intravenous (IV) site. SEEK IMMEDIATE MEDICAL CARE IF:  Any of the following symptoms occur over the next 12 hours:  Shaking chills.  You have a temperature by mouth above 102 F (38.9 C), not controlled by medicine.  Chest, back, or muscle pain.  People around you feel you are not acting correctly or are confused.  Shortness of breath or difficulty breathing.  Dizziness and fainting.  You get a rash or develop hives.  You have a decrease in urine output.  Your urine turns a dark color or changes to pink, red, or brown. Any of the following symptoms occur over the next 10 days:  You have a temperature by mouth above 102 F (38.9 C), not controlled by medicine.  Shortness of breath.  Weakness after normal activity.  The white part of the eye turns yellow (jaundice).  You have a decrease in the amount of urine or are urinating less often.  Your urine turns a dark color or changes to pink, red, or brown. Document Released: 05/22/2000 Document Revised: 08/17/2011 Document Reviewed: 01/09/2008 ExitCare Patient Information 2014 [place].  _______________________________________________________________________  Incentive Spirometer  An incentive spirometer is a tool that can help keep your lungs clear and active. This tool measures how well you are filling your lungs with each breath. Taking long deep breaths may help reverse or decrease the chance of developing breathing (pulmonary) problems (especially infection) following:  A long period of time when you are unable to move or be active. BEFORE THE PROCEDURE   If the spirometer includes an  indicator to show your best effort, your nurse or respiratory therapist will set it to a desired goal.  If possible, sit up straight or lean slightly forward. Try not to slouch.  Hold the incentive spirometer in an upright position. INSTRUCTIONS FOR USE  1. Sit on the edge of your bed if possible, or sit up as far as you can in bed or on a chair. 2. Hold the incentive spirometer in an upright position. 3. Breathe out normally. 4. Place the mouthpiece in your mouth and seal your lips tightly around it. 5. Breathe in slowly and as deeply as possible, raising  the piston or the ball toward the top of the column. 6. Hold your breath for 3-5 seconds or for as long as possible. Allow the piston or ball to fall to the bottom of the column. 7. Remove the mouthpiece from your mouth and breathe out normally. 8. Rest for a few seconds and repeat Steps 1 through 7 at least 10 times every 1-2 hours when you are awake. Take your time and take a few normal breaths between deep breaths. 9. The spirometer may include an indicator to show your best effort. Use the indicator as a goal to work toward during each repetition. 10. After each set of 10 deep breaths, practice coughing to be sure your lungs are clear. If you have an incision (the cut made at the time of surgery), support your incision when coughing by placing a pillow or rolled up towels firmly against it. Once you are able to get out of bed, walk around indoors and cough well. You may stop using the incentive spirometer when instructed by your caregiver.  RISKS AND COMPLICATIONS  Take your time so you do not get dizzy or light-headed.  If you are in pain, you may need to take or ask for pain medication before doing incentive spirometry. It is harder to take a deep breath if you are having pain. AFTER USE  Rest and breathe slowly and easily.  It can be helpful to keep track of a log of your progress. Your caregiver can provide you with a simple table  to help with this. If you are using the spirometer at home, follow these instructions: [place] IF:   You are having difficultly using the spirometer.  You have trouble using the spirometer as often as instructed.  Your pain medication is not giving enough relief while using the spirometer.  You develop fever of 100.5 F (38.1 C) or higher. SEEK IMMEDIATE MEDICAL CARE IF:   You cough up bloody sputum that had not been present before.  You develop fever of 102 F (38.9 C) or greater.  You develop worsening pain at or near the incision site. MAKE SURE YOU:   Understand these instructions.  Will watch your condition.  Will get help right away if you are not doing well or get worse. Document Released: 10/05/2006 Document Revised: 08/17/2011 Document Reviewed: 12/06/2006 [facility] Patient Information 2014 [place], [place].   ________________________________________________________________________

## 2018-01-21 ENCOUNTER — Ambulatory Visit (HOSPITAL_COMMUNITY)
Admission: RE | Admit: 2018-01-21 | Discharge: 2018-01-21 | Disposition: A | Discharge status: Home / Self Care | Payer: Medicare PPO | Source: Ambulatory Visit | Attending: Anesthesiology | Admitting: Anesthesiology

## 2018-01-21 ENCOUNTER — Encounter (HOSPITAL_COMMUNITY)
Admission: RE | Admit: 2018-01-21 | Discharge: 2018-01-21 | Disposition: A | Discharge status: Home / Self Care | Payer: Medicare PPO | Source: Ambulatory Visit | Attending: Gynecologic Oncology | Admitting: Gynecologic Oncology

## 2018-01-21 ENCOUNTER — Other Ambulatory Visit: Payer: Self-pay

## 2018-01-21 ENCOUNTER — Encounter (HOSPITAL_COMMUNITY): Payer: Self-pay

## 2018-01-21 DIAGNOSIS — Z01818 Encounter for other preprocedural examination: Secondary | ICD-10-CM | POA: Diagnosis present

## 2018-01-21 DIAGNOSIS — Z01812 Encounter for preprocedural laboratory examination: Secondary | ICD-10-CM | POA: Insufficient documentation

## 2018-01-21 DIAGNOSIS — C482 Malignant neoplasm of peritoneum, unspecified: Secondary | ICD-10-CM | POA: Insufficient documentation

## 2018-01-21 HISTORY — DX: Depression, unspecified: F32.A

## 2018-01-21 HISTORY — DX: Major depressive disorder, single episode, unspecified: F32.9

## 2018-01-21 HISTORY — DX: Sleep apnea, unspecified: G47.30

## 2018-01-21 LAB — URINALYSIS, ROUTINE W REFLEX MICROSCOPIC
Bilirubin Urine: NEGATIVE
GLUCOSE, UA: NEGATIVE mg/dL
Hgb urine dipstick: NEGATIVE
Ketones, ur: NEGATIVE mg/dL
NITRITE: NEGATIVE
PROTEIN: 30 mg/dL — AB
SPECIFIC GRAVITY, URINE: 1.025 (ref 1.005–1.030)
pH: 5 (ref 5.0–8.0)

## 2018-01-21 LAB — COMPREHENSIVE METABOLIC PANEL
ALK PHOS: 101 U/L (ref 38–126)
ALT: 21 U/L (ref 0–44)
ANION GAP: 9 (ref 5–15)
AST: 26 U/L (ref 15–41)
Albumin: 3.7 g/dL (ref 3.5–5.0)
BILIRUBIN TOTAL: 0.5 mg/dL (ref 0.3–1.2)
BUN: 16 mg/dL (ref 8–23)
CALCIUM: 8.8 mg/dL — AB (ref 8.9–10.3)
CO2: 27 mmol/L (ref 22–32)
CREATININE: 0.73 mg/dL (ref 0.44–1.00)
Chloride: 109 mmol/L (ref 98–111)
Glucose, Bld: 112 mg/dL — ABNORMAL HIGH (ref 70–99)
Potassium: 3.7 mmol/L (ref 3.5–5.1)
SODIUM: 145 mmol/L (ref 135–145)
TOTAL PROTEIN: 6.8 g/dL (ref 6.5–8.1)

## 2018-01-21 LAB — CBC
HEMATOCRIT: 32.6 % — AB (ref 36.0–46.0)
HEMOGLOBIN: 10.2 g/dL — AB (ref 12.0–15.0)
MCH: 21.9 pg — AB (ref 26.0–34.0)
MCHC: 31.3 g/dL (ref 30.0–36.0)
MCV: 70.1 fL — AB (ref 78.0–100.0)
Platelets: 155 10*3/uL (ref 150–400)
RBC: 4.65 MIL/uL (ref 3.87–5.11)
RDW: 19.4 % — ABNORMAL HIGH (ref 11.5–15.5)
WBC: 4 10*3/uL (ref 4.0–10.5)

## 2018-01-21 NOTE — Progress Notes (Signed)
EKG-10/23/2017-epic

## 2018-01-21 NOTE — Progress Notes (Signed)
Requested by fax from office of DR Alinda Money the following:  Most recent echo, office visit note, ekg and [employer] heart monitor report.

## 2018-01-21 NOTE — Progress Notes (Signed)
U/A and CBC done 01/21/2018 faxed via epic to DR [facility] and to [facility].

## 2018-01-24 NOTE — Progress Notes (Signed)
Last Loop recorder check done 01/13/2018 on chart   Echo done 01/10/2018 on chart,.

## 2018-01-26 ENCOUNTER — Encounter (HOSPITAL_COMMUNITY): Payer: Self-pay | Admitting: *Deleted

## 2018-01-26 ENCOUNTER — Inpatient Hospital Stay: Payer: Medicare PPO

## 2018-01-26 ENCOUNTER — Inpatient Hospital Stay: Payer: Medicare PPO | Attending: Hematology and Oncology | Admitting: Genetic Counselor

## 2018-01-26 ENCOUNTER — Encounter: Payer: Self-pay | Admitting: Genetic Counselor

## 2018-01-26 DIAGNOSIS — C482 Malignant neoplasm of peritoneum, unspecified: Secondary | ICD-10-CM | POA: Insufficient documentation

## 2018-01-26 DIAGNOSIS — Z803 Family history of malignant neoplasm of breast: Secondary | ICD-10-CM | POA: Insufficient documentation

## 2018-01-26 DIAGNOSIS — Z8042 Family history of malignant neoplasm of prostate: Secondary | ICD-10-CM

## 2018-01-26 DIAGNOSIS — Z8542 Personal history of malignant neoplasm of other parts of uterus: Secondary | ICD-10-CM | POA: Diagnosis not present

## 2018-01-26 DIAGNOSIS — Z8049 Family history of malignant neoplasm of other genital organs: Secondary | ICD-10-CM | POA: Diagnosis not present

## 2018-01-26 DIAGNOSIS — Z7183 Encounter for nonprocreative genetic counseling: Secondary | ICD-10-CM

## 2018-01-26 LAB — CMP (CANCER CENTER ONLY)
ALBUMIN: 4 g/dL (ref 3.5–5.0)
ALK PHOS: 115 U/L (ref 38–126)
ALT: 20 U/L (ref 0–44)
AST: 23 U/L (ref 15–41)
Anion gap: 8 (ref 5–15)
BUN: 18 mg/dL (ref 8–23)
CALCIUM: 9.1 mg/dL (ref 8.9–10.3)
CO2: 30 mmol/L (ref 22–32)
CREATININE: 0.86 mg/dL (ref 0.44–1.00)
Chloride: 103 mmol/L (ref 98–111)
GFR, Est AFR Am: >60 mL/min (ref >60)
GFR, Estimated: >60 mL/min (ref >60)
GLUCOSE: 97 mg/dL (ref 70–99)
Potassium: 3.9 mmol/L (ref 3.5–5.1)
SODIUM: 141 mmol/L (ref 135–145)
Total Bilirubin: 0.4 mg/dL (ref 0.3–1.2)
Total Protein: 7.4 g/dL (ref 6.5–8.1)

## 2018-01-26 LAB — CBC WITH DIFFERENTIAL (CANCER CENTER ONLY)
BASOS PCT: 1 %
Basophils Absolute: 0 10*3/uL (ref 0.0–0.1)
EOS ABS: 0.3 10*3/uL (ref 0.0–0.5)
EOS PCT: 7 %
HCT: 33.6 % — ABNORMAL LOW (ref 34.8–46.6)
Hemoglobin: 10.5 g/dL — ABNORMAL LOW (ref 11.6–15.9)
Lymphocytes Relative: 46 %
Lymphs Abs: 1.9 10*3/uL (ref 0.9–3.3)
MCH: 21.8 pg — AB (ref 25.1–34.0)
MCHC: 31.3 g/dL — AB (ref 31.5–36.0)
MCV: 69.6 fL — ABNORMAL LOW (ref 79.5–101.0)
MONO ABS: 0.4 10*3/uL (ref 0.1–0.9)
MONOS PCT: 10 %
Neutro Abs: 1.5 10*3/uL (ref 1.5–6.5)
Neutrophils Relative %: 36 %
Platelet Count: 151 10*3/uL (ref 145–400)
RBC: 4.83 MIL/uL (ref 3.70–5.45)
RDW: 17.3 % — AB (ref 11.2–14.5)
WBC Count: 4.1 10*3/uL (ref 3.9–10.3)

## 2018-01-26 NOTE — Progress Notes (Signed)
REFERRING PROVIDER: [name], MD [place], [place] [address]  PRIMARY PROVIDER:  [name], MD  PRIMARY REASON FOR VISIT:  1. Primary peritoneal carcinomatosis ([place])   2. Family history of breast cancer   3. Family history of uterine cancer   4. Family history of prostate cancer   5. History of uterine cancer      HISTORY OF PRESENT ILLNESS:   Jo Parker, a 72 y.o. female, was seen for a [facility] cancer genetics consultation at the request of Dr. Alvy Bimler due to a personal and family history of cancer.  Jo Parker presents to clinic today to discuss the possibility of a hereditary predisposition to cancer, genetic testing, and to further clarify her future cancer risks, as well as potential cancer risks for family members.   In 2019, at the age of 68, Jo Parker was diagnosed with ovarian cancer. This was treated with chemotherapy and she will have surgery tomorrow.  Jo Parker reports that when she was 59 she was diagnosed with uterine cancer.  This was treated with a partial hysterectomy.     CANCER HISTORY:    Primary peritoneal carcinomatosis ([place])   08/18/2017 Imaging    US imaging showed scular AAA measured 3.8 cm, liver nodularity with ascites and pleural effusions    09/03/2017 Imaging    CT chest elsewhere showed small bilateral pleural effusion    09/13/2017 Pathology Results    Fluid cytology positive for adenocarcinoma, strongly positive for CK7, CA-125, moderate to strong MOC-31 and B72.3. CK20, CEA and Ca19-9 were negative    09/16/2017 Imaging    CT abdomen and pelvis elsewhere showed small bilateral pleural effusion, cardiomegaly, 2.6 cm ovoid cystic mass at the pancreatic tail, 3.5 cm saccular aneurysm, no retroperitoneal lymphadenopathy, marked sigmoid wall thickening without overt mechanical bowel obstruction, marked omental thickening and ascites.    10/08/2017 Procedure    Extensive omental and peritoneal tumor is present  anteriorly deep to the abdominal wall. From a midline approach, tumor was sampled just to the left of midline in the upper pelvis, below the level of the umbilicus. Solid tissue was obtained.  IMPRESSION: CT-guided core biopsy performed of omental/peritoneal tumor in the anterior peritoneal cavity.    10/08/2017 Pathology Results    Soft Tissue Needle Core Biopsy, Omentum/Peritoneal Cavity - POORLY DIFFERENTIATED CARCINOMA. Microscopic Comment Immunohistochemistry will be performed and reported as an addendum. Dr. [name] agrees. Called to [facility] on 10/11/17. (JDP:kh 10/11/17) ADDENDUM: Immunohistochemistry shows the tumor is positive with cytokeratin AE1/AE3, cytokeratin 7, estrogen receptor, WT-1, and p16. The tumor is negative with CD56, chromogranin, synaptophysin, CDX-2, cytokeratin 20, progesterone receptor, GATA-3, GCDFP, Napsin-A and thyroid transcription factor-1. 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 The catheter is ready for use.    10/14/2017 Procedure    A total of approximately 1300 mL of ascitic fluid was removed.    10/15/2017 Tumor Marker    Patient's tumor was tested for the following markers: CA-125 Results of the tumor marker test revealed 775.9    10/18/2017 -  Chemotherapy    The patient had carboplatin and Taxol  10/22/2017 - 10/27/2017 Hospital Admission    She was admitted to the hospital for evaluation of shortness of breath and was found to have large pleural effusion, successfully removed and cytology was malignant.  Sepsis was ruled out.    10/23/2017 Imaging    No evidence of  pulmonary embolism.  New moderate size left effusion and new small to moderate right effusion with associated bibasilar atelectasis.  Known extensive peritoneal carcinomatosis and moderate ascites.  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 Recommend correlation with prior exams and vascular surgery consultation.  2.1 cm left renal cyst.  Mild cardiomegaly and minimal atherosclerotic coronary artery disease.  Aortic Atherosclerosis (ICD10-I70.0) and Emphysema (ICD10-J43.9).    10/24/2017 Pathology Results    PLEURAL FLUID, LEFT (SPECIMEN 1 OF 1 COLLECTED 10/24/17): MALIGNANT CELLS CONSISTENT WITH METASTATIC ADENOCARCINOMA. SEE COMMENT.    10/24/2017 Procedure    Successful ultrasound guided left thoracentesis yielding 580 mL of pleural fluid.  Follow-up chest radiograph shows no pneumothorax    11/03/2017 Tumor Marker    Patient's tumor was tested for the following markers: CA-125 Results of the tumor marker test revealed 991.9    11/29/2017 Tumor Marker    Patient's tumor was tested for the following markers: CA-125 Results of the tumor marker test revealed 133    12/14/2017 Imaging    Decreased peritoneal carcinomatosis, and near complete resolution of ascites since prior exam.  No new or progressive disease within the abdomen or pelvis.  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    12/20/2017 Tumor Marker    Patient's tumor was tested for the following markers: CA-125 Results of the tumor marker test revealed 51.5      HORMONAL RISK FACTORS:  Menarche was at age 80.  First live birth at age 72.  OCP use for approximately 5+ years.  Ovaries intact: yes.  Hysterectomy: yes.  Menopausal status: postmenopausal.  HRT use: 0 years. Colonoscopy: yes; 1-2. Mammogram within the last year: yes. Number of breast biopsies: 0. Up to date with pelvic exams:  yes. Any excessive radiation exposure in the past:  no  Past  Medical History:  Diagnosis Date  . Anxiety and depression   . Aortic aneurysm ([place])    see ov note 03/2017   . Arthritis   . Carcinomatosis ([place]) 10/2017  . COPD (chronic obstructive pulmonary disease) ([place]) 10/23/2017  . Depression   . Family history of breast cancer   . Family history of prostate cancer   . Family history of uterine cancer   . History of diverticulitis   . HLD (hyperlipidemia) 10/23/2017  . Hypercholesterolemia   . Hypertension   . Mitral valve prolapse   . Sleep apnea   . Uterine cancer ([place])    had partial hysterectomy at 27    Past Surgical History:  Procedure Laterality Date  . ABDOMINAL HYSTERECTOMY    . cardiac monitor implant    . CEREBRAL ANEURYSM REPAIR    . CERVICAL SPINE SURGERY     bone spurs  . IR FLUORO GUIDE PORT INSERTION RIGHT  10/14/2017  . IR PARACENTESIS  10/14/2017  . IR US GUIDE VASC ACCESS RIGHT  10/14/2017  . PARTIAL HYSTERECTOMY  1971   "precancer"    Social History   Socioeconomic History  . Marital status: Married    Spouse name: Not on file  . Number of children: 3  . Years of education: Not on file  . Highest education level: Not  on file  Occupational History  . Occupation: retired  Scientific laboratory technician  . Financial resource strain: Not on file  . Food insecurity:    Worry: Not on file    Inability: Not on file  . Transportation needs:    Medical: Not on file    Non-medical: Not on file  Tobacco Use  . Smoking status: Former Smoker    Packs/day: 1.00    Years: 55.00    Pack years: 55.00    Last attempt to quit: 10/06/2017    Years since quitting: 0.3  . Smokeless tobacco: Never Used  Substance and Sexual Activity  . Alcohol use: Never    Frequency: Never  . Drug use: Never  . Sexual activity: Not Currently  Lifestyle  . Physical activity:    Days per week: Not on file    Minutes per session: Not on file  . Stress: Not on file  Relationships  . Social connections:    Talks on phone: Not on file    Gets together:  Not on file    Attends religious service: Not on file    Active member of club or organization: Not on file    Attends meetings of clubs or organizations: Not on file    Relationship status: Not on file  Other Topics Concern  . Not on file  Social History Narrative  . Not on file     FAMILY HISTORY:  We obtained a detailed, 4-generation family history.  Significant diagnoses are listed below: Family History  Problem Relation Age of Onset  . Endometrial cancer Sister 3       endometrial ca  . Endometrial cancer Paternal Aunt 80  . Breast cancer Paternal Aunt   . Stomach cancer Maternal Grandfather        d. 70s  . Prostate cancer Paternal Uncle   . Breast cancer Cousin        pat first cousin  . Breast cancer Cousin        pat first cousin's daughter  . Lung cancer Father        lung ca  . Heart disease Mother   . Stroke Paternal Grandmother   . Endometrial cancer Cousin        paternal first cousin's daughter  . Breast cancer Other        niece dx less than 22s  . Breast cancer Other        niece, dx under 80    The patient has three sons who are cancer free.  She had five sisters and three brothers.  Two brothers died in car accidents, one who had two daughters with breast cancer.  One sister was diagnosed with uterine cancer and died at 87.  Both parents are deceased.  The patient's mother died of heart disease.  She had five paternal half siblings - three brothers and two sisters.  Two brothers had prostate cancer, a brother had sister had lung cancer, and a sister had an unknown cancer.  The maternal grandfather had stomach cancer and the grandmother died very young of an illness.  The patient's father died of lung cancer.  He had two sisters and a brother.  The brother had prostate cancer, one sister had breast cancer.  This sister had a daughter with breast cancer, and also had a son who had two daughters, one with breast cancer and the other with uterine cancer.  One  additional sister of the patient's father had uterine  cancer.  Jo Parker is unaware of previous family history of genetic testing for hereditary cancer risks. Patient's maternal ancestors are of [place] American descent, and paternal ancestors are of [place] and [place] descent. There is no reported Ashkenazi Jewish ancestry. There is no known consanguinity.  GENETIC COUNSELING ASSESSMENT: Jo Parker is a 72 y.o. female with a personal and family history of cancer which is somewhat suggestive of a Lynch syndrome and predisposition to cancer. We, therefore, discussed and recommended the following at today's visit.   DISCUSSION: We discussed that about 3-5% of uterine cancer and 15-20% of ovarian cancer is due to hereditary causes.  Most commonly, these two cancers are seen together in Lynch syndrome.  There are several family members on the paternal side of the family have uterine cancer, but no one with colon cancer.  We can also see ovarian cancer and breast cancer in hereditary breast cancer syndromes such as BRCA mutations.  There are other genes that can increase the risk for breast, uterine and/or ovarian cancer.    We reviewed the characteristics, features and inheritance patterns of hereditary cancer syndromes. We also discussed genetic testing, including the appropriate family members to test, the process of testing, insurance coverage and turn-around-time for results. We discussed the implications of a negative, positive and/or variant of uncertain significant result. We recommended Jo Parker pursue genetic testing for the [facility] gene panel. The [facility] gene panel offered by [employer] includes sequencing and deletion/duplication testing of the following 35 genes: APC, ATM, AXIN2, BARD1, BMPR1A, BRCA1, BRCA2, BRIP1, CHD1, CDK4, CDKN2A, CHEK2, EPCAM (large rearrangement only), HOXB13, (sequencing only), GALNT12, MLH1, MSH2, MSH3 (excluding repetitive  portions of exon 1), MSH6, MUTYH, NBN, NTHL1, PALB2, PMS2, PTEN, RAD51C, RAD51D, RNF43, RPS20, SMAD4, STK11, and TP53. Sequencing was performed for select regions of POLE and POLD1, and large rearrangement analysis was performed for select regions of GREM1.   Based on Ms. Simi's personal and family history of cancer, she meets medical criteria for genetic testing. Despite that she meets criteria, she may still have an out of pocket cost. We discussed that if her out of pocket cost for testing is over $100, the laboratory will call and confirm whether she wants to proceed with testing.  If the out of pocket cost of testing is less than $100 she will be billed by the genetic testing laboratory.   We discussed that genetic testing through [facility] will test for hereditary mutations that could explain her diagnosis of cancer.  However, homologous recombination testing (HRD) is genetic testing performed on her tumor that can determine genetic changes that could influence her management.  HRD testing is performed in tandem with genetic testing, and typically at no additional cost.    PLAN: After considering the risks, benefits, and limitations, Jo Parker  provided informed consent to pursue genetic testing and the blood sample was sent to [facility] for analysis of the [facility] panel. Results should be available within approximately 2-3 weeks' time, at which point they will be disclosed by telephone to Jo Parker, as will any additional recommendations warranted by these results. Jo Parker will receive a summary of her genetic counseling visit and a copy of her results once available. This information will also be available in Epic. We encouraged Jo Parker to remain in contact with cancer genetics annually so that we can continuously update the family history and inform her of any changes in cancer genetics and testing that may be of benefit for  her family. Jo Parker questions were answered to  her satisfaction today. Our contact information was provided should additional questions or concerns arise.  Based on Jo Parker's family history, we recommended her nieces, who were diagnosed with breast cancer at under the age of 45, have genetic counseling and testing. Jo Parker will let us know if we can be of any assistance in coordinating genetic counseling and/or testing for this family member.   Lastly, we encouraged Jo Parker to remain in contact with cancer genetics annually so that we can continuously update the family history and inform her of any changes in cancer genetics and testing that may be of benefit for this family.   Ms.  Parker questions were answered to her satisfaction today. Our contact information was provided should additional questions or concerns arise. Thank you for the referral and allowing [name] to share in the care of your patient.   Karen P. Florene Glen, [place], [facility] Certified Genetic Counselor [email or web address] phone: [phone]  The patient was seen for a total of 55 minutes in face-to-face genetic counseling.  This patient was discussed with Drs. Magrinat, Lindi Adie and/or Burr Medico who agrees with the above.    _______________________________________________________________________ For Office Staff:  Number of people involved in session: 3 Was an Intern/ student involved with case: no

## 2018-01-26 NOTE — Progress Notes (Signed)
Spoke with Zoila Shutter at OB/GYN Oncology earlier today and she stated Dr Skeet Latch requested no clearances from cardiology.

## 2018-01-26 NOTE — Progress Notes (Signed)
Loop recorder is biotronik monitor device- last check on 01/13/2018 on chart.  [facility] 03/30/2017- on chart  03/09/2017- Office visit note with Dr Sheral Apley- on chart

## 2018-01-27 ENCOUNTER — Other Ambulatory Visit: Payer: Self-pay

## 2018-01-27 ENCOUNTER — Inpatient Hospital Stay (HOSPITAL_COMMUNITY): Payer: Medicare PPO | Admitting: Anesthesiology

## 2018-01-27 ENCOUNTER — Encounter (HOSPITAL_COMMUNITY): Payer: Self-pay | Admitting: Anesthesiology

## 2018-01-27 ENCOUNTER — Inpatient Hospital Stay (HOSPITAL_COMMUNITY)
Admission: RE | Admit: 2018-01-27 | Discharge: 2018-03-04 | DRG: 003 | Disposition: A | Discharge status: Other Acute Inpatient Hospital | Payer: Medicare PPO | Attending: Gynecologic Oncology | Admitting: Gynecologic Oncology

## 2018-01-27 ENCOUNTER — Encounter (HOSPITAL_COMMUNITY)
Admission: RE | Disposition: A | Discharge status: Nursing Home (Includes ICF) | Payer: Self-pay | Source: Home / Self Care | Attending: Gynecologic Oncology

## 2018-01-27 DIAGNOSIS — D638 Anemia in other chronic diseases classified elsewhere: Secondary | ICD-10-CM | POA: Diagnosis present

## 2018-01-27 DIAGNOSIS — E861 Hypovolemia: Secondary | ICD-10-CM | POA: Diagnosis not present

## 2018-01-27 DIAGNOSIS — T8149XA Infection following a procedure, other surgical site, initial encounter: Secondary | ICD-10-CM | POA: Diagnosis not present

## 2018-01-27 DIAGNOSIS — E874 Mixed disorder of acid-base balance: Secondary | ICD-10-CM | POA: Diagnosis not present

## 2018-01-27 DIAGNOSIS — J9620 Acute and chronic respiratory failure, unspecified whether with hypoxia or hypercapnia: Secondary | ICD-10-CM | POA: Diagnosis not present

## 2018-01-27 DIAGNOSIS — G92 Toxic encephalopathy: Secondary | ICD-10-CM | POA: Diagnosis not present

## 2018-01-27 DIAGNOSIS — I110 Hypertensive heart disease with heart failure: Secondary | ICD-10-CM | POA: Diagnosis present

## 2018-01-27 DIAGNOSIS — Z4659 Encounter for fitting and adjustment of other gastrointestinal appliance and device: Secondary | ICD-10-CM

## 2018-01-27 DIAGNOSIS — Z800 Family history of malignant neoplasm of digestive organs: Secondary | ICD-10-CM

## 2018-01-27 DIAGNOSIS — J9600 Acute respiratory failure, unspecified whether with hypoxia or hypercapnia: Secondary | ICD-10-CM | POA: Diagnosis not present

## 2018-01-27 DIAGNOSIS — J80 Acute respiratory distress syndrome: Secondary | ICD-10-CM | POA: Diagnosis not present

## 2018-01-27 DIAGNOSIS — Z978 Presence of other specified devices: Secondary | ICD-10-CM | POA: Diagnosis not present

## 2018-01-27 DIAGNOSIS — C569 Malignant neoplasm of unspecified ovary: Secondary | ICD-10-CM | POA: Diagnosis not present

## 2018-01-27 DIAGNOSIS — R0902 Hypoxemia: Secondary | ICD-10-CM

## 2018-01-27 DIAGNOSIS — D61818 Other pancytopenia: Secondary | ICD-10-CM | POA: Diagnosis present

## 2018-01-27 DIAGNOSIS — E43 Unspecified severe protein-calorie malnutrition: Secondary | ICD-10-CM | POA: Diagnosis present

## 2018-01-27 DIAGNOSIS — T8141XA Infection following a procedure, superficial incisional surgical site, initial encounter: Secondary | ICD-10-CM | POA: Diagnosis not present

## 2018-01-27 DIAGNOSIS — Z8049 Family history of malignant neoplasm of other genital organs: Secondary | ICD-10-CM

## 2018-01-27 DIAGNOSIS — R0602 Shortness of breath: Secondary | ICD-10-CM

## 2018-01-27 DIAGNOSIS — I472 Ventricular tachycardia: Secondary | ICD-10-CM | POA: Diagnosis not present

## 2018-01-27 DIAGNOSIS — K567 Ileus, unspecified: Secondary | ICD-10-CM | POA: Diagnosis not present

## 2018-01-27 DIAGNOSIS — R011 Cardiac murmur, unspecified: Secondary | ICD-10-CM | POA: Diagnosis not present

## 2018-01-27 DIAGNOSIS — K668 Other specified disorders of peritoneum: Secondary | ICD-10-CM | POA: Diagnosis not present

## 2018-01-27 DIAGNOSIS — R400 Somnolence: Secondary | ICD-10-CM

## 2018-01-27 DIAGNOSIS — R6520 Severe sepsis without septic shock: Secondary | ICD-10-CM | POA: Diagnosis not present

## 2018-01-27 DIAGNOSIS — Z9189 Other specified personal risk factors, not elsewhere classified: Secondary | ICD-10-CM | POA: Diagnosis not present

## 2018-01-27 DIAGNOSIS — Z430 Encounter for attention to tracheostomy: Secondary | ICD-10-CM

## 2018-01-27 DIAGNOSIS — Z8542 Personal history of malignant neoplasm of other parts of uterus: Secondary | ICD-10-CM

## 2018-01-27 DIAGNOSIS — C786 Secondary malignant neoplasm of retroperitoneum and peritoneum: Secondary | ICD-10-CM | POA: Diagnosis present

## 2018-01-27 DIAGNOSIS — T8131XA Disruption of external operation (surgical) wound, not elsewhere classified, initial encounter: Secondary | ICD-10-CM | POA: Diagnosis not present

## 2018-01-27 DIAGNOSIS — K659 Peritonitis, unspecified: Secondary | ICD-10-CM | POA: Diagnosis not present

## 2018-01-27 DIAGNOSIS — R0603 Acute respiratory distress: Secondary | ICD-10-CM | POA: Diagnosis present

## 2018-01-27 DIAGNOSIS — C482 Malignant neoplasm of peritoneum, unspecified: Secondary | ICD-10-CM | POA: Diagnosis present

## 2018-01-27 DIAGNOSIS — R410 Disorientation, unspecified: Secondary | ICD-10-CM | POA: Diagnosis not present

## 2018-01-27 DIAGNOSIS — N739 Female pelvic inflammatory disease, unspecified: Secondary | ICD-10-CM | POA: Diagnosis not present

## 2018-01-27 DIAGNOSIS — T8149XD Infection following a procedure, other surgical site, subsequent encounter: Secondary | ICD-10-CM | POA: Diagnosis not present

## 2018-01-27 DIAGNOSIS — D649 Anemia, unspecified: Secondary | ICD-10-CM | POA: Diagnosis present

## 2018-01-27 DIAGNOSIS — R6521 Severe sepsis with septic shock: Secondary | ICD-10-CM | POA: Diagnosis not present

## 2018-01-27 DIAGNOSIS — E870 Hyperosmolality and hypernatremia: Secondary | ICD-10-CM | POA: Diagnosis not present

## 2018-01-27 DIAGNOSIS — G9341 Metabolic encephalopathy: Secondary | ICD-10-CM | POA: Diagnosis not present

## 2018-01-27 DIAGNOSIS — Z9221 Personal history of antineoplastic chemotherapy: Secondary | ICD-10-CM

## 2018-01-27 DIAGNOSIS — J449 Chronic obstructive pulmonary disease, unspecified: Secondary | ICD-10-CM | POA: Diagnosis present

## 2018-01-27 DIAGNOSIS — R140 Abdominal distension (gaseous): Secondary | ICD-10-CM

## 2018-01-27 DIAGNOSIS — Z7951 Long term (current) use of inhaled steroids: Secondary | ICD-10-CM

## 2018-01-27 DIAGNOSIS — Z7982 Long term (current) use of aspirin: Secondary | ICD-10-CM | POA: Diagnosis not present

## 2018-01-27 DIAGNOSIS — R109 Unspecified abdominal pain: Secondary | ICD-10-CM

## 2018-01-27 DIAGNOSIS — J9601 Acute respiratory failure with hypoxia: Secondary | ICD-10-CM | POA: Diagnosis not present

## 2018-01-27 DIAGNOSIS — Z90722 Acquired absence of ovaries, bilateral: Secondary | ICD-10-CM

## 2018-01-27 DIAGNOSIS — K5903 Drug induced constipation: Secondary | ICD-10-CM | POA: Diagnosis not present

## 2018-01-27 DIAGNOSIS — Y836 Removal of other organ (partial) (total) as the cause of abnormal reaction of the patient, or of later complication, without mention of misadventure at the time of the procedure: Secondary | ICD-10-CM | POA: Diagnosis not present

## 2018-01-27 DIAGNOSIS — N179 Acute kidney failure, unspecified: Secondary | ICD-10-CM | POA: Diagnosis present

## 2018-01-27 DIAGNOSIS — F419 Anxiety disorder, unspecified: Secondary | ICD-10-CM | POA: Diagnosis present

## 2018-01-27 DIAGNOSIS — R0600 Dyspnea, unspecified: Secondary | ICD-10-CM | POA: Diagnosis not present

## 2018-01-27 DIAGNOSIS — J9621 Acute and chronic respiratory failure with hypoxia: Secondary | ICD-10-CM | POA: Diagnosis not present

## 2018-01-27 DIAGNOSIS — R739 Hyperglycemia, unspecified: Secondary | ICD-10-CM | POA: Diagnosis not present

## 2018-01-27 DIAGNOSIS — E46 Unspecified protein-calorie malnutrition: Secondary | ICD-10-CM | POA: Diagnosis not present

## 2018-01-27 DIAGNOSIS — K559 Vascular disorder of intestine, unspecified: Secondary | ICD-10-CM | POA: Diagnosis not present

## 2018-01-27 DIAGNOSIS — Z0189 Encounter for other specified special examinations: Secondary | ICD-10-CM

## 2018-01-27 DIAGNOSIS — Z87891 Personal history of nicotine dependence: Secondary | ICD-10-CM

## 2018-01-27 DIAGNOSIS — Z9889 Other specified postprocedural states: Secondary | ICD-10-CM | POA: Diagnosis not present

## 2018-01-27 DIAGNOSIS — C800 Disseminated malignant neoplasm, unspecified: Secondary | ICD-10-CM | POA: Diagnosis not present

## 2018-01-27 DIAGNOSIS — Z9911 Dependence on respirator [ventilator] status: Secondary | ICD-10-CM | POA: Diagnosis not present

## 2018-01-27 DIAGNOSIS — I10 Essential (primary) hypertension: Secondary | ICD-10-CM | POA: Diagnosis not present

## 2018-01-27 DIAGNOSIS — R579 Shock, unspecified: Secondary | ICD-10-CM | POA: Diagnosis not present

## 2018-01-27 DIAGNOSIS — B9620 Unspecified Escherichia coli [E. coli] as the cause of diseases classified elsewhere: Secondary | ICD-10-CM | POA: Diagnosis not present

## 2018-01-27 DIAGNOSIS — J9622 Acute and chronic respiratory failure with hypercapnia: Secondary | ICD-10-CM | POA: Diagnosis not present

## 2018-01-27 DIAGNOSIS — J690 Pneumonitis due to inhalation of food and vomit: Secondary | ICD-10-CM | POA: Diagnosis not present

## 2018-01-27 DIAGNOSIS — T402X5A Adverse effect of other opioids, initial encounter: Secondary | ICD-10-CM | POA: Diagnosis not present

## 2018-01-27 DIAGNOSIS — A419 Sepsis, unspecified organism: Secondary | ICD-10-CM | POA: Diagnosis not present

## 2018-01-27 DIAGNOSIS — G4733 Obstructive sleep apnea (adult) (pediatric): Secondary | ICD-10-CM | POA: Diagnosis present

## 2018-01-27 DIAGNOSIS — Z801 Family history of malignant neoplasm of trachea, bronchus and lung: Secondary | ICD-10-CM

## 2018-01-27 DIAGNOSIS — E785 Hyperlipidemia, unspecified: Secondary | ICD-10-CM | POA: Diagnosis not present

## 2018-01-27 DIAGNOSIS — R197 Diarrhea, unspecified: Secondary | ICD-10-CM | POA: Diagnosis not present

## 2018-01-27 DIAGNOSIS — R509 Fever, unspecified: Secondary | ICD-10-CM | POA: Diagnosis not present

## 2018-01-27 DIAGNOSIS — C561 Malignant neoplasm of right ovary: Secondary | ICD-10-CM | POA: Diagnosis present

## 2018-01-27 DIAGNOSIS — K651 Peritoneal abscess: Secondary | ICD-10-CM | POA: Diagnosis not present

## 2018-01-27 DIAGNOSIS — J189 Pneumonia, unspecified organism: Secondary | ICD-10-CM | POA: Diagnosis not present

## 2018-01-27 DIAGNOSIS — Z79899 Other long term (current) drug therapy: Secondary | ICD-10-CM

## 2018-01-27 DIAGNOSIS — Z95828 Presence of other vascular implants and grafts: Secondary | ICD-10-CM | POA: Diagnosis not present

## 2018-01-27 DIAGNOSIS — Z803 Family history of malignant neoplasm of breast: Secondary | ICD-10-CM

## 2018-01-27 DIAGNOSIS — R34 Anuria and oliguria: Secondary | ICD-10-CM | POA: Diagnosis not present

## 2018-01-27 DIAGNOSIS — I5030 Unspecified diastolic (congestive) heart failure: Secondary | ICD-10-CM | POA: Diagnosis not present

## 2018-01-27 DIAGNOSIS — R1110 Vomiting, unspecified: Secondary | ICD-10-CM

## 2018-01-27 DIAGNOSIS — C562 Malignant neoplasm of left ovary: Principal | ICD-10-CM | POA: Diagnosis present

## 2018-01-27 DIAGNOSIS — L8990 Pressure ulcer of unspecified site, unspecified stage: Secondary | ICD-10-CM

## 2018-01-27 DIAGNOSIS — K7460 Unspecified cirrhosis of liver: Secondary | ICD-10-CM | POA: Diagnosis present

## 2018-01-27 DIAGNOSIS — Z66 Do not resuscitate: Secondary | ICD-10-CM | POA: Diagnosis not present

## 2018-01-27 DIAGNOSIS — I959 Hypotension, unspecified: Secondary | ICD-10-CM | POA: Diagnosis not present

## 2018-01-27 DIAGNOSIS — Z885 Allergy status to narcotic agent status: Secondary | ICD-10-CM | POA: Diagnosis not present

## 2018-01-27 DIAGNOSIS — I5033 Acute on chronic diastolic (congestive) heart failure: Secondary | ICD-10-CM | POA: Diagnosis present

## 2018-01-27 DIAGNOSIS — E876 Hypokalemia: Secondary | ICD-10-CM | POA: Diagnosis not present

## 2018-01-27 DIAGNOSIS — E7800 Pure hypercholesterolemia, unspecified: Secondary | ICD-10-CM | POA: Diagnosis present

## 2018-01-27 DIAGNOSIS — Z6824 Body mass index (BMI) 24.0-24.9, adult: Secondary | ICD-10-CM

## 2018-01-27 DIAGNOSIS — E872 Acidosis: Secondary | ICD-10-CM | POA: Diagnosis not present

## 2018-01-27 DIAGNOSIS — B999 Unspecified infectious disease: Secondary | ICD-10-CM | POA: Diagnosis not present

## 2018-01-27 HISTORY — PX: DEBULKING: SHX6277

## 2018-01-27 HISTORY — PX: LAPAROTOMY: SHX154

## 2018-01-27 HISTORY — PX: OMENTECTOMY: SHX5985

## 2018-01-27 LAB — POCT I-STAT 4, (NA,K, GLUC, HGB,HCT)
Glucose, Bld: 133 mg/dL — ABNORMAL HIGH (ref 70–99)
HEMATOCRIT: 30 % — AB (ref 36.0–46.0)
HEMOGLOBIN: 10.2 g/dL — AB (ref 12.0–15.0)
Potassium: 3.7 mmol/L (ref 3.5–5.1)
SODIUM: 141 mmol/L (ref 135–145)

## 2018-01-27 LAB — CA 125: CANCER ANTIGEN (CA) 125: 28 U/mL (ref 0.0–38.1)

## 2018-01-27 SURGERY — OMENTECTOMY
Anesthesia: General

## 2018-01-27 MED ORDER — ONDANSETRON HCL 4 MG PO TABS
4.0000 mg | ORAL_TABLET | Freq: Four times a day (QID) | ORAL | Status: DC | PRN
  Administered 2018-01-30: 4 mg via ORAL
  Filled 2018-01-27: qty 1

## 2018-01-27 MED ORDER — ENOXAPARIN SODIUM 40 MG/0.4ML ~~LOC~~ SOLN
40.0000 mg | SUBCUTANEOUS | Status: DC
  Administered 2018-01-28 – 2018-01-30 (×2): 40 mg via SUBCUTANEOUS
  Filled 2018-01-27 (×3): qty 0.4

## 2018-01-27 MED ORDER — TRAMADOL HCL 50 MG PO TABS
100.0000 mg | ORAL_TABLET | Freq: Two times a day (BID) | ORAL | Status: DC | PRN
  Administered 2018-01-28 – 2018-01-29 (×2): 100 mg via ORAL
  Filled 2018-01-27 (×2): qty 2

## 2018-01-27 MED ORDER — ONDANSETRON HCL 4 MG/2ML IJ SOLN
INTRAMUSCULAR | Status: DC | PRN
  Administered 2018-01-27: 4 mg via INTRAVENOUS

## 2018-01-27 MED ORDER — PREGABALIN 25 MG PO CAPS
25.0000 mg | ORAL_CAPSULE | Freq: Two times a day (BID) | ORAL | Status: DC
  Administered 2018-01-28: 25 mg via ORAL
  Filled 2018-01-27: qty 1

## 2018-01-27 MED ORDER — ENOXAPARIN SODIUM 40 MG/0.4ML ~~LOC~~ SOLN
40.0000 mg | SUBCUTANEOUS | Status: AC
  Administered 2018-01-27: 40 mg via SUBCUTANEOUS
  Filled 2018-01-27: qty 0.4

## 2018-01-27 MED ORDER — ONDANSETRON HCL 4 MG/2ML IJ SOLN
INTRAMUSCULAR | Status: AC
  Filled 2018-01-27: qty 2

## 2018-01-27 MED ORDER — IBUPROFEN 200 MG PO TABS
600.0000 mg | ORAL_TABLET | Freq: Four times a day (QID) | ORAL | Status: DC
  Administered 2018-01-28 (×3): 600 mg via ORAL
  Filled 2018-01-27 (×4): qty 3

## 2018-01-27 MED ORDER — HYDROMORPHONE HCL 1 MG/ML IJ SOLN: 0.2500 mg | INTRAMUSCULAR | Status: DC | PRN

## 2018-01-27 MED ORDER — CARVEDILOL 12.5 MG PO TABS
12.5000 mg | ORAL_TABLET | Freq: Two times a day (BID) | ORAL | Status: DC
  Administered 2018-01-27 – 2018-01-28 (×2): 12.5 mg via ORAL
  Filled 2018-01-27 (×2): qty 1

## 2018-01-27 MED ORDER — NON FORMULARY: 1.0000 [IU] | Freq: Three times a day (TID) | Status: DC

## 2018-01-27 MED ORDER — BUPIVACAINE-EPINEPHRINE (PF) 0.25% -1:200000 IJ SOLN
INTRAMUSCULAR | Status: AC
  Filled 2018-01-27: qty 30

## 2018-01-27 MED ORDER — DEXAMETHASONE SODIUM PHOSPHATE 10 MG/ML IJ SOLN
INTRAMUSCULAR | Status: DC | PRN
  Administered 2018-01-27: 10 mg via INTRAVENOUS

## 2018-01-27 MED ORDER — ONDANSETRON HCL 4 MG/2ML IJ SOLN: 4.0000 mg | Freq: Once | INTRAMUSCULAR | Status: DC | PRN

## 2018-01-27 MED ORDER — ONDANSETRON HCL 4 MG/2ML IJ SOLN
4.0000 mg | Freq: Four times a day (QID) | INTRAMUSCULAR | Status: DC | PRN
  Administered 2018-01-31: 4 mg via INTRAVENOUS
  Filled 2018-01-27: qty 2

## 2018-01-27 MED ORDER — RINGERS IRRIGATION IR SOLN
Status: DC | PRN
  Administered 2018-01-27: 1

## 2018-01-27 MED ORDER — PHENYLEPHRINE HCL 10 MG/ML IJ SOLN
INTRAMUSCULAR | Status: AC
  Filled 2018-01-27: qty 1

## 2018-01-27 MED ORDER — SODIUM CHLORIDE 0.9 % IJ SOLN
INTRAMUSCULAR | Status: AC
  Filled 2018-01-27: qty 100

## 2018-01-27 MED ORDER — EPHEDRINE SULFATE 50 MG/ML IJ SOLN
INTRAMUSCULAR | Status: DC | PRN
  Administered 2018-01-27 (×2): 10 mg via INTRAVENOUS

## 2018-01-27 MED ORDER — BUPIVACAINE LIPOSOME 1.3 % IJ SUSP
20.0000 mL | Freq: Once | INTRAMUSCULAR | Status: AC
  Administered 2018-01-27: 20 mL
  Filled 2018-01-27: qty 20

## 2018-01-27 MED ORDER — SENNOSIDES-DOCUSATE SODIUM 8.6-50 MG PO TABS
2.0000 | ORAL_TABLET | Freq: Every day | ORAL | Status: DC
  Administered 2018-01-27 – 2018-01-30 (×4): 2 via ORAL
  Filled 2018-01-27 (×4): qty 2

## 2018-01-27 MED ORDER — FENTANYL CITRATE (PF) 250 MCG/5ML IJ SOLN
INTRAMUSCULAR | Status: AC
  Filled 2018-01-27: qty 5

## 2018-01-27 MED ORDER — ACETAMINOPHEN 500 MG PO TABS
1000.0000 mg | ORAL_TABLET | ORAL | Status: AC
  Administered 2018-01-27: 1000 mg via ORAL
  Filled 2018-01-27: qty 2

## 2018-01-27 MED ORDER — CHEWING GUM (ORBIT) SUGAR FREE
1.0000 | CHEWING_GUM | Freq: Three times a day (TID) | ORAL | Status: DC
  Administered 2018-01-27 – 2018-01-30 (×10): 1 via ORAL
  Filled 2018-01-27 (×2): qty 1

## 2018-01-27 MED ORDER — PROPOFOL 10 MG/ML IV BOLUS
INTRAVENOUS | Status: DC | PRN
  Administered 2018-01-27: 40 mg via INTRAVENOUS
  Administered 2018-01-27: 100 mg via INTRAVENOUS

## 2018-01-27 MED ORDER — HYDROMORPHONE HCL 1 MG/ML IJ SOLN
0.5000 mg | INTRAMUSCULAR | Status: DC | PRN
  Administered 2018-01-27 – 2018-01-28 (×2): 0.5 mg via INTRAVENOUS
  Filled 2018-01-27 (×2): qty 0.5

## 2018-01-27 MED ORDER — MIDAZOLAM HCL 2 MG/2ML IJ SOLN
INTRAMUSCULAR | Status: AC
  Filled 2018-01-27: qty 2

## 2018-01-27 MED ORDER — LIDOCAINE 2% (20 MG/ML) 5 ML SYRINGE
INTRAMUSCULAR | Status: AC
  Filled 2018-01-27: qty 5

## 2018-01-27 MED ORDER — LIDOCAINE 2% (20 MG/ML) 5 ML SYRINGE
INTRAMUSCULAR | Status: DC | PRN
  Administered 2018-01-27: 1.5 mg/kg/h via INTRAVENOUS

## 2018-01-27 MED ORDER — SODIUM CHLORIDE 0.9 % IJ SOLN
INTRAMUSCULAR | Status: DC | PRN
  Administered 2018-01-27: 100 mL

## 2018-01-27 MED ORDER — FENTANYL CITRATE (PF) 100 MCG/2ML IJ SOLN
INTRAMUSCULAR | Status: DC | PRN
  Administered 2018-01-27: 100 ug via INTRAVENOUS
  Administered 2018-01-27: 25 ug via INTRAVENOUS
  Administered 2018-01-27: 50 ug via INTRAVENOUS
  Administered 2018-01-27: 25 ug via INTRAVENOUS

## 2018-01-27 MED ORDER — ASPIRIN EC 81 MG PO TBEC: 81.0000 mg | DELAYED_RELEASE_TABLET | Freq: Every day | ORAL | Status: DC

## 2018-01-27 MED ORDER — SUGAMMADEX SODIUM 500 MG/5ML IV SOLN
INTRAVENOUS | Status: AC
  Filled 2018-01-27: qty 5

## 2018-01-27 MED ORDER — LABETALOL HCL 5 MG/ML IV SOLN
INTRAVENOUS | Status: AC
  Filled 2018-01-27: qty 4

## 2018-01-27 MED ORDER — ROCURONIUM BROMIDE 50 MG/5ML IV SOSY
PREFILLED_SYRINGE | INTRAVENOUS | Status: DC | PRN
  Administered 2018-01-27: 15 mg via INTRAVENOUS
  Administered 2018-01-27: 50 mg via INTRAVENOUS

## 2018-01-27 MED ORDER — LACTATED RINGERS IV SOLN
INTRAVENOUS | Status: DC
  Administered 2018-01-27: 1000 mL via INTRAVENOUS
  Administered 2018-01-27 (×2): via INTRAVENOUS

## 2018-01-27 MED ORDER — OXYCODONE HCL 5 MG PO TABS
5.0000 mg | ORAL_TABLET | ORAL | Status: DC | PRN
  Administered 2018-01-27 – 2018-01-29 (×3): 5 mg via ORAL
  Filled 2018-01-27 (×3): qty 1

## 2018-01-27 MED ORDER — LIDOCAINE 2% (20 MG/ML) 5 ML SYRINGE
INTRAMUSCULAR | Status: DC | PRN
  Administered 2018-01-27: 100 mg via INTRAVENOUS

## 2018-01-27 MED ORDER — ENSURE ENLIVE PO LIQD
237.0000 mL | Freq: Two times a day (BID) | ORAL | Status: DC
  Administered 2018-01-28: 237 mL via ORAL

## 2018-01-27 MED ORDER — ORAL CARE MOUTH RINSE
15.0000 mL | Freq: Two times a day (BID) | OROMUCOSAL | Status: DC
  Administered 2018-01-28 – 2018-01-31 (×6): 15 mL via OROMUCOSAL

## 2018-01-27 MED ORDER — CITALOPRAM HYDROBROMIDE 20 MG PO TABS
20.0000 mg | ORAL_TABLET | Freq: Every day | ORAL | Status: DC
  Administered 2018-01-28 – 2018-01-30 (×3): 20 mg via ORAL
  Filled 2018-01-27 (×3): qty 1

## 2018-01-27 MED ORDER — ACETAMINOPHEN 500 MG PO TABS: 1000.0000 mg | ORAL_TABLET | Freq: Two times a day (BID) | ORAL | Status: DC

## 2018-01-27 MED ORDER — PHENYLEPHRINE 40 MCG/ML (10ML) SYRINGE FOR IV PUSH (FOR BLOOD PRESSURE SUPPORT)
PREFILLED_SYRINGE | INTRAVENOUS | Status: AC
  Filled 2018-01-27: qty 10

## 2018-01-27 MED ORDER — PHENYLEPHRINE 40 MCG/ML (10ML) SYRINGE FOR IV PUSH (FOR BLOOD PRESSURE SUPPORT)
PREFILLED_SYRINGE | INTRAVENOUS | Status: DC | PRN
  Administered 2018-01-27 (×4): 80 ug via INTRAVENOUS
  Administered 2018-01-27: 40 ug via INTRAVENOUS

## 2018-01-27 MED ORDER — ROCURONIUM BROMIDE 10 MG/ML (PF) SYRINGE
PREFILLED_SYRINGE | INTRAVENOUS | Status: AC
  Filled 2018-01-27: qty 10

## 2018-01-27 MED ORDER — LABETALOL HCL 5 MG/ML IV SOLN
INTRAVENOUS | Status: DC | PRN
  Administered 2018-01-27: 5 mg via INTRAVENOUS

## 2018-01-27 MED ORDER — SUGAMMADEX SODIUM 200 MG/2ML IV SOLN
INTRAVENOUS | Status: DC | PRN
  Administered 2018-01-27: 150 mg via INTRAVENOUS

## 2018-01-27 MED ORDER — BUPIVACAINE-EPINEPHRINE (PF) 0.25% -1:200000 IJ SOLN
INTRAMUSCULAR | Status: DC | PRN
  Administered 2018-01-27: 30 mL via PERINEURAL

## 2018-01-27 MED ORDER — SODIUM CHLORIDE 0.9 % IV SOLN
INTRAVENOUS | Status: DC | PRN
  Administered 2018-01-27: 50 ug/min via INTRAVENOUS

## 2018-01-27 MED ORDER — CELECOXIB 200 MG PO CAPS
400.0000 mg | ORAL_CAPSULE | ORAL | Status: AC
  Administered 2018-01-27: 400 mg via ORAL
  Filled 2018-01-27: qty 2

## 2018-01-27 MED ORDER — GABAPENTIN 300 MG PO CAPS
300.0000 mg | ORAL_CAPSULE | ORAL | Status: AC
  Administered 2018-01-27: 300 mg via ORAL
  Filled 2018-01-27: qty 1

## 2018-01-27 MED ORDER — MEPERIDINE HCL 50 MG/ML IJ SOLN: 6.2500 mg | INTRAMUSCULAR | Status: DC | PRN

## 2018-01-27 MED ORDER — EPHEDRINE 5 MG/ML INJ
INTRAVENOUS | Status: AC
  Filled 2018-01-27: qty 10

## 2018-01-27 MED ORDER — ALBUMIN HUMAN 5 % IV SOLN
INTRAVENOUS | Status: DC | PRN
  Administered 2018-01-27: 10:00:00 via INTRAVENOUS

## 2018-01-27 MED ORDER — KCL IN DEXTROSE-NACL 20-5-0.45 MEQ/L-%-% IV SOLN
INTRAVENOUS | Status: DC
  Administered 2018-01-27 – 2018-01-28 (×2): via INTRAVENOUS
  Filled 2018-01-27 (×2): qty 1000

## 2018-01-27 MED ORDER — SCOPOLAMINE 1 MG/3DAYS TD PT72
1.0000 | MEDICATED_PATCH | TRANSDERMAL | Status: DC
  Administered 2018-01-27: 1.5 mg via TRANSDERMAL
  Filled 2018-01-27: qty 1

## 2018-01-27 MED ORDER — STERILE WATER FOR IRRIGATION IR SOLN
Status: DC | PRN
  Administered 2018-01-27: 1000 mL

## 2018-01-27 MED ORDER — MOMETASONE FURO-FORMOTEROL FUM 200-5 MCG/ACT IN AERO
2.0000 | INHALATION_SPRAY | Freq: Two times a day (BID) | RESPIRATORY_TRACT | Status: DC
  Administered 2018-01-27 – 2018-01-31 (×8): 2 via RESPIRATORY_TRACT
  Filled 2018-01-27: qty 8.8

## 2018-01-27 MED ORDER — PROPOFOL 10 MG/ML IV BOLUS
INTRAVENOUS | Status: AC
  Filled 2018-01-27: qty 20

## 2018-01-27 MED ORDER — SODIUM CHLORIDE 0.9 % IV SOLN
2.0000 g | INTRAVENOUS | Status: AC
  Administered 2018-01-27: 2 g via INTRAVENOUS
  Filled 2018-01-27: qty 2

## 2018-01-27 MED ORDER — MIDAZOLAM HCL 5 MG/5ML IJ SOLN
INTRAMUSCULAR | Status: DC | PRN
  Administered 2018-01-27 (×3): 1 mg via INTRAVENOUS

## 2018-01-27 MED ORDER — DEXAMETHASONE SODIUM PHOSPHATE 4 MG/ML IJ SOLN: 4.0000 mg | INTRAMUSCULAR | Status: DC

## 2018-01-27 MED ORDER — DEXAMETHASONE SODIUM PHOSPHATE 10 MG/ML IJ SOLN
INTRAMUSCULAR | Status: AC
  Filled 2018-01-27: qty 1

## 2018-01-27 SURGICAL SUPPLY — 78 items
ATTRACTOMAT 16X20 MAGNETIC DRP (DRAPES) ×4 IMPLANT
BAG URINE DRAINAGE (UROLOGICAL SUPPLIES) ×4 IMPLANT
BLADE EXTENDED COATED 6.5IN (ELECTRODE) ×8 IMPLANT
CATH FOLEY 2WAY SLVR  5CC 16FR (CATHETERS) ×2
CATH FOLEY 2WAY SLVR 5CC 16FR (CATHETERS) ×2 IMPLANT
CLIP VESOCCLUDE LG 6/CT (CLIP) ×4 IMPLANT
CLIP VESOCCLUDE MED 6/CT (CLIP) ×4 IMPLANT
CLIP VESOCCLUDE MED LG 6/CT (CLIP) ×12 IMPLANT
CONT SPEC 4OZ CLIKSEAL STRL BL (MISCELLANEOUS) ×4 IMPLANT
COVER BACK TABLE 60X90IN (DRAPES) ×4 IMPLANT
COVER SURGICAL LIGHT HANDLE (MISCELLANEOUS) ×4 IMPLANT
COVER TIP SHEARS 8 DVNC (MISCELLANEOUS) ×2 IMPLANT
COVER TIP SHEARS 8MM DA VINCI (MISCELLANEOUS) ×2
DERMABOND ADVANCED (GAUZE/BANDAGES/DRESSINGS) ×2
DERMABOND ADVANCED .7 DNX12 (GAUZE/BANDAGES/DRESSINGS) ×2 IMPLANT
DRAPE ARM DVNC X/XI (DISPOSABLE) ×8 IMPLANT
DRAPE COLUMN DVNC XI (DISPOSABLE) ×2 IMPLANT
DRAPE DA VINCI XI ARM (DISPOSABLE) ×8
DRAPE DA VINCI XI COLUMN (DISPOSABLE) ×2
DRAPE SHEET LG 3/4 BI-LAMINATE (DRAPES) ×4 IMPLANT
DRAPE SURG IRRIG POUCH 19X23 (DRAPES) ×4 IMPLANT
DRAPE WARM FLUID 44X44 (DRAPE) ×4 IMPLANT
DRSG OPSITE POSTOP 4X10 (GAUZE/BANDAGES/DRESSINGS) ×4 IMPLANT
ELECT PENCIL ROCKER SW 15FT (MISCELLANEOUS) ×4 IMPLANT
ELECT REM PT RETURN 15FT ADLT (MISCELLANEOUS) ×4 IMPLANT
GAUZE 4X4 16PLY RFD (DISPOSABLE) IMPLANT
GAUZE SPONGE 4X4 12PLY STRL (GAUZE/BANDAGES/DRESSINGS) ×4 IMPLANT
GLOVE BIO SURGEON STRL SZ 6.5 (GLOVE) ×6 IMPLANT
GLOVE BIO SURGEON STRL SZ7.5 (GLOVE) ×12 IMPLANT
GLOVE BIO SURGEONS STRL SZ 6.5 (GLOVE) ×2
GLOVE BIOGEL PI IND STRL 7.0 (GLOVE) ×2 IMPLANT
GLOVE BIOGEL PI INDICATOR 7.0 (GLOVE) ×2
GLOVE INDICATOR 8.0 STRL GRN (GLOVE) ×8 IMPLANT
GOWN STRL NON-REIN LRG LVL3 (GOWN DISPOSABLE) ×4 IMPLANT
GOWN STRL REUS W/ TWL LRG LVL3 (GOWN DISPOSABLE) ×2 IMPLANT
GOWN STRL REUS W/TWL LRG LVL3 (GOWN DISPOSABLE) ×2
GOWN STRL REUS W/TWL XL LVL3 (GOWN DISPOSABLE) ×8 IMPLANT
HOLDER FOLEY CATH W/STRAP (MISCELLANEOUS) ×4 IMPLANT
IRRIG SUCT STRYKERFLOW 2 WTIP (MISCELLANEOUS) ×4
IRRIGATION SUCT STRKRFLW 2 WTP (MISCELLANEOUS) ×2 IMPLANT
KIT BASIN OR (CUSTOM PROCEDURE TRAY) ×4 IMPLANT
LIGASURE IMPACT 36 18CM CVD LR (INSTRUMENTS) ×4 IMPLANT
MANIPULATOR UTERINE 4.5 ZUMI (MISCELLANEOUS) IMPLANT
NEEDLE HYPO 25X1 1.5 SAFETY (NEEDLE) ×4 IMPLANT
NS IRRIG 1000ML POUR BTL (IV SOLUTION) ×16 IMPLANT
PACK GENERAL/GYN (CUSTOM PROCEDURE TRAY) ×4 IMPLANT
PACK ROBOT GYN CUSTOM WL (TRAY / TRAY PROCEDURE) ×4 IMPLANT
PAD POSITIONING PINK XL (MISCELLANEOUS) ×4 IMPLANT
POUCH SPECIMEN RETRIEVAL 10MM (ENDOMECHANICALS) IMPLANT
RETRACTOR WND ALEXIS 25 LRG (MISCELLANEOUS) ×2 IMPLANT
RTRCTR WOUND ALEXIS 25CM LRG (MISCELLANEOUS) ×4
SEAL CANN UNIV 5-8 DVNC XI (MISCELLANEOUS) ×8 IMPLANT
SEAL XI 5MM-8MM UNIVERSAL (MISCELLANEOUS) ×8
SET TRI-LUMEN FLTR TB AIRSEAL (TUBING) ×4 IMPLANT
SHEET LAVH (DRAPES) ×4 IMPLANT
SOLUTION ELECTROLUBE (MISCELLANEOUS) ×4 IMPLANT
SPONGE LAP 18X18 RF (DISPOSABLE) ×8 IMPLANT
SPONGE LAP 18X18 X RAY DECT (DISPOSABLE) ×16 IMPLANT
SPONGE LAP 4X18 RFD (DISPOSABLE) ×16 IMPLANT
SUT PDS AB 0 CTX 60 (SUTURE) ×8 IMPLANT
SUT PDS AB 1 CTXB1 36 (SUTURE) ×8 IMPLANT
SUT PDS AB 1 TP1 96 (SUTURE) ×8 IMPLANT
SUT VIC AB 0 CT1 27 (SUTURE) ×2
SUT VIC AB 0 CT1 27XBRD ANTBC (SUTURE) ×2 IMPLANT
SUT VIC AB 0 CT1 36 (SUTURE) ×32 IMPLANT
SUT VIC AB 2-0 CT2 27 (SUTURE) ×8 IMPLANT
SUT VIC AB 2-0 SH 27 (SUTURE) ×4
SUT VIC AB 2-0 SH 27X BRD (SUTURE) ×4 IMPLANT
SUT VIC AB 3-0 SH 8-18 (SUTURE) ×4 IMPLANT
SUT VICRYL 2 0 18  UND BR (SUTURE) ×2
SUT VICRYL 2 0 18 UND BR (SUTURE) ×2 IMPLANT
SYR CONTROL 10ML LL (SYRINGE) ×4 IMPLANT
TOWEL OR 17X26 10 PK STRL BLUE (TOWEL DISPOSABLE) ×4 IMPLANT
TOWEL OR NON WOVEN STRL DISP B (DISPOSABLE) ×4 IMPLANT
TRAP SPECIMEN MUCOUS 40CC (MISCELLANEOUS) IMPLANT
UNDERPAD 30X30 (UNDERPADS AND DIAPERS) ×4 IMPLANT
WATER STERILE IRR 1000ML POUR (IV SOLUTION) ×4 IMPLANT
YANKAUER SUCT BULB TIP 10FT TU (MISCELLANEOUS) ×4 IMPLANT

## 2018-01-27 NOTE — Anesthesia Preprocedure Evaluation (Addendum)
Anesthesia Evaluation  Patient identified by MRN, date of birth, ID band Patient awake    Reviewed: Allergy & Precautions, NPO status , Patient's Chart, lab work & pertinent test results  Airway Mallampati: I  TM Distance: >3 FB Neck ROM: Full    Dental   Pulmonary sleep apnea , COPD, former smoker,    Pulmonary exam normal        Cardiovascular hypertension, Pt. on medications Normal cardiovascular exam     Neuro/Psych Anxiety Depression    GI/Hepatic   Endo/Other    Renal/GU      Musculoskeletal   Abdominal   Peds  Hematology   Anesthesia Other Findings   Reproductive/Obstetrics                            Anesthesia Physical Anesthesia Plan  ASA: III  Anesthesia Plan: General   Post-op Pain Management:    Induction: Intravenous  PONV Risk Score and Plan: 3 and Ondansetron, Dexamethasone and Midazolam  Airway Management Planned: Oral ETT  Additional Equipment:   Intra-op Plan:   Post-operative Plan: Extubation in OR  Informed Consent: I have reviewed the patients History and Physical, chart, labs and discussed the procedure including the risks, benefits and alternatives for the proposed anesthesia with the patient or authorized representative who has indicated his/her understanding and acceptance.     Plan Discussed with: CRNA and Surgeon  Anesthesia Plan Comments:         Anesthesia Quick Evaluation

## 2018-01-27 NOTE — Op Note (Signed)
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  Skeet Latch, M.D. PhD  Assistant Surgeon: Lahoma Crocker M.D.   Anesthesia: General endotracheal  Indication for Procedure: Stage IV ovarian epithelial ovarian cancer status post 4 cycles neoadjuvant chemotherapy  Operative Findings: No frequent disease within the infra gastric omentum in close approximation to the stomach invading the anterior abdominal wall with attachment to the left adnexa.  Miliary disease of the bilateral diaphragmatic surfaces and also the mesentery of the large and small bowel.  Left adnexa densely adherent to the distal rectosigmoid colon.  Right adnexa adherent to the pelvic sidewall.  Multiple loops of bowel adherent to miliary disease in the vaginal cuff.  At the completion of procedure the patient was R1 disease status  Procedure:Jo Parker was taken to the OR and timeout performed.  She was then placed under general anesthesia and her feet placed in the dorsal lithotomy position.   [name] was prepped and draped in the usual sterile fashion.   A second time out was performed.  A sponge stick was placed within the vagina.  An incision was made in the left upper quadrant at Palmer's point and the laparoscope introduced using an Optiview.  The abdomen and pelvis were evaluated, the above findings noted and the decision was made to proceed with a laparotomy .  The existing vertical midline incision was excised and the abdomen entered.  The disease invading anterior abdominal wall was resected for to facilitate placement of an Chiropodist.  All the omental disease replacing the intragastric  omentum was resected using sharp dissection and hemostasis.  The defect was noted in the mesentery of the transverse colon and this was closed with suture.  The left retroperitoneal space was entered with transection of the left round ligament.  Ureter and ovarian vessels were identified.  The ovarian vessels were sealed and transected with use of the LigaSure.  The left adnexal mass was dissected free from the rectosigmoid colon.  Sharp dissection was used to facilitate this.  The disruption in the large bowel serosa was repaired with running suture.  The right retroperitoneal space was entered and the right adnexa transected after identification of the ureter.  The abdomen and pelvis were copiously irrigated and hemostasis was assured.  The anterior abdominal wall was infiltrated with Exparel.  The anterior abdominal wall was closed with 0 looped Vicryl suture using a mass closure technique.  The subcutaneous tissue was gated and then reapproximated with Vicryl suture.  The skin at the site of the incision and also the port site was closed with Monocryl.  Specimens: Infragastric omentum, anterior abdominal wall, bilateral adnexa  Estimated Blood Loss: 200 mL. Blood Replacement: None   Sponge, lap and needle counts were correct x 3.    Complications: None  The patient had sequential compression devices for VTE prophylaxis and will receive Lovenox postoperatively.           Disposition: PACU - hemodynamically stable.         Condition: Stable

## 2018-01-27 NOTE — Anesthesia Postprocedure Evaluation (Signed)
Anesthesia Post Note  Patient: Jo Parker  Procedure(s) Performed: OMENTECTOMY (N/A ) RADICAL DEBULKING (N/A ) EXPLORATORY LAPAROTOMY WITH REMOVAL OF BILATERAL TUBES AND OVARIES (N/A )     Patient location during evaluation: PACU Anesthesia Type: General Level of consciousness: awake and alert Pain management: pain level controlled Vital Signs Assessment: post-procedure vital signs reviewed and stable Respiratory status: spontaneous breathing, nonlabored ventilation, respiratory function stable and patient connected to nasal cannula oxygen Cardiovascular status: blood pressure returned to baseline and stable Postop Assessment: no apparent nausea or vomiting Anesthetic complications: no    Last Vitals:  Vitals:   01/27/18 1200 01/27/18 1220  BP: 112/65 112/62  Pulse: 79 79  Resp: 17 16  Temp: (!) 36.3 C 36.5 C  SpO2: 99% 100%    Last Pain:  Vitals:   01/27/18 1244  TempSrc:   PainSc: 5                  Ernest Orr DAVID

## 2018-01-27 NOTE — Anesthesia Procedure Notes (Signed)
Procedure Name: Intubation Date/Time: 01/27/2018 7:40 AM Performed by: Bonney Aid, CRNA Pre-anesthesia Checklist: Patient identified, Emergency Drugs available, Suction available and Patient being monitored Patient Re-evaluated:Patient Re-evaluated prior to induction Oxygen Delivery Method: Circle system utilized Preoxygenation: Pre-oxygenation with 100% oxygen Induction Type: IV induction Ventilation: Mask ventilation without difficulty Laryngoscope Size: Mac and 3 Grade View: Grade III Tube type: Oral Tube size: 7.0 mm Number of attempts: 3 Airway Equipment and Method: Stylet and Oral airway Placement Confirmation: ETT inserted through vocal cords under direct vision,  positive ETCO2 and breath sounds checked- equal and bilateral Secured at: 21 cm Tube secured with: Tape Dental Injury: Teeth and Oropharynx as per pre-operative assessment  Comments: Grade 3 view, looked twice by Elmore Guise CRNA,  3rd attempt by Dr Conrad Dendron, Intubated.  Tolerated all well, no trauma

## 2018-01-27 NOTE — Transfer of Care (Signed)
Immediate Anesthesia Transfer of Care Note  Patient: Jo Parker  Procedure(s) Performed: OMENTECTOMY (N/A ) RADICAL DEBULKING (N/A ) EXPLORATORY LAPAROTOMY WITH REMOVAL OF BILATERAL TUBES AND OVARIES (N/A )  Patient Location: PACU  Anesthesia Type:General  Level of Consciousness: sedated  Airway & Oxygen Therapy: Patient Spontanous Breathing and Patient connected to face mask oxygen, OPA removed  Post-op Assessment: Report given to RN  Post vital signs: Reviewed and stable  Last Vitals:  Vitals Value Taken Time  BP 111/53 01/27/2018 11:01 AM  Temp    Pulse 75 01/27/2018 11:06 AM  Resp 12 01/27/2018 11:06 AM  SpO2 99 % 01/27/2018 11:06 AM  Vitals shown include unvalidated device data.  Last Pain:  Vitals:   01/27/18 0652  TempSrc:   PainSc: 0-No pain      Patients Stated Pain Goal: 3 (48/34/75 8307)  Complications: No apparent anesthesia complications

## 2018-01-27 NOTE — H&P (Signed)
CHIEF COMPLAINT:  Ovarian cancer, pre op Interval debulking  HPI: [name] is a very nice 72 y.o. P3  March 2019 she had a abdominal ultrasound for her history of aortic aneurysm there was an incidental finding of a cirrhotic appearing liver and ascites. Initially treated with diuretics but after lack of improvement further work-up was performed including a CT scan of the abdomen and pelvis. As noted below etiology was significant carcinomatosis with peritoneal thickening sigmoid colon with significant thickening new pancreatic cyst compared to a 2018 scan and omental caking. Seen by medical oncology [facility] which is where she lives. Paracentesis was ordered along with tumor markers including CA 125 which was noted to be elevated CA-19-9 which was mildly elevated normal being 35 the cytology on the paracentesis was positive for adenocarcinoma IHC staining suggested a GYN primary CK 7 and Ca1 25 being positive then CEA CK 20 and CA-19-9 being negative. Pleural effusion was positive for adenocarcinoma   CA125  09/13/17 = 597.9  CA 125 12/20/2017 51.5  Mammography December 2018 within normal limits  Colonoscopy 2 years ago within normal limits  Oncologic History: as in HPI    Primary peritoneal carcinomatosis ([place])   08/18/2017 Imaging    US imaging showed scular AAA measured 3.8 cm, liver nodularity with ascites and pleural effusions    09/03/2017 Imaging    CT chest elsewhere showed small bilateral pleural effusion    09/13/2017 Pathology Results    Fluid cytology positive for adenocarcinoma, strongly positive for CK7, CA-125, moderate to strong MOC-31 and B72.3. CK20, CEA and Ca19-9 were negative    09/16/2017 Imaging    CT abdomen and pelvis elsewhere showed small bilateral pleural effusion, cardiomegaly, 2.6 cm ovoid cystic mass at the pancreatic tail, 3.5 cm saccular aneurysm, no retroperitoneal lymphadenopathy, marked sigmoid wall thickening without overt mechanical  bowel obstruction, marked omental thickening and ascites.    10/08/2017 Procedure    Extensive omental and peritoneal tumor is present anteriorly deep to the abdominal wall. From a midline approach, tumor was sampled just to the left of midline in the upper pelvis, below the level of the umbilicus. Solid tissue was obtained.  IMPRESSION: CT-guided core biopsy performed of omental/peritoneal tumor in the anterior peritoneal cavity.    10/08/2017 Pathology Results    Soft Tissue Needle Core Biopsy, Omentum/Peritoneal Cavity - POORLY DIFFERENTIATED CARCINOMA. Microscopic Comment Immunohistochemistry will be performed and reported as an addendum. Dr. [name] agrees. Called to [facility] on 10/11/17. (JDP:kh 10/11/17) ADDENDUM: Immunohistochemistry shows the tumor is positive with cytokeratin AE1/AE3, cytokeratin 7, estrogen receptor, WT-1, and p16. The tumor is negative with CD56, chromogranin, synaptophysin, CDX-2, cytokeratin 20, progesterone receptor, GATA-3, GCDFP, Napsin-A and thyroid transcription factor-1. 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 The catheter is ready for use.    10/14/2017 Procedure    A total of approximately 1300 mL of ascitic fluid was removed.    10/15/2017 Tumor Marker    Patient's tumor was tested for the following markers: CA-125 Results of the tumor marker test revealed 775.9    10/18/2017 -  Chemotherapy  The patient had carboplatin and Taxol    10/22/2017 - 10/27/2017 Hospital Admission    She was admitted to the hospital for evaluation of shortness of breath and was found to have large  pleural effusion, successfully removed and cytology was malignant.  Sepsis was ruled out.    10/23/2017 Imaging    No evidence of pulmonary embolism.  New moderate size left effusion and new small to moderate right effusion with associated bibasilar atelectasis.  Known extensive peritoneal carcinomatosis and moderate ascites.  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 Recommend correlation with prior exams and vascular surgery consultation.  2.1 cm left renal cyst.  Mild cardiomegaly and minimal atherosclerotic coronary artery disease.  Aortic Atherosclerosis (ICD10-I70.0) and Emphysema (ICD10-J43.9).    10/24/2017 Pathology Results    PLEURAL FLUID, LEFT (SPECIMEN 1 OF 1 COLLECTED 10/24/17): MALIGNANT CELLS CONSISTENT WITH METASTATIC ADENOCARCINOMA. SEE COMMENT.    10/24/2017 Procedure    Successful ultrasound guided left thoracentesis yielding 580 mL of pleural fluid.  Follow-up chest radiograph shows no pneumothorax    11/03/2017 Tumor Marker    Patient's tumor was tested for the following markers: CA-125 Results of the tumor marker test revealed 991.9    11/29/2017 Tumor Marker    Patient's tumor was tested for the following markers: CA-125 Results of the tumor marker test revealed 133    12/14/2017 Imaging    Decreased peritoneal carcinomatosis, and near complete resolution of ascites since prior exam.  No new or progressive disease within the abdomen or pelvis.  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    12/20/2017 Tumor Marker    Patient's tumor was tested for the following markers: CA-125 Results of the tumor marker test revealed 51.5     No current facility-administered medications on file prior to encounter.    Current Outpatient Medications on File Prior to Encounter  Medication Sig Dispense Refill  . aspirin EC 81 MG tablet Take 81 mg by mouth daily.    Marland Kitchen atorvastatin (LIPITOR) 80 MG tablet Take 80 mg by  mouth daily.    . budesonide-formoterol (SYMBICORT) 160-4.5 MCG/ACT inhaler Inhale 2 puffs into the lungs 2 (two) times daily. Use two puffs by mouth twice daily(rinse mouth after use)    . carvedilol (COREG) 12.5 MG tablet Take 12.5 mg by mouth 2 (two) times daily with a meal.    . citalopram (CELEXA) 20 MG tablet Take 1 tablet (20 mg total) by mouth daily. 30 tablet 0  . flintstones complete (FLINTSTONES) 60 MG chewable tablet Chew 2 tablets by mouth daily.    . Melatonin 5 MG CAPS Take 10 mg by mouth at bedtime as needed (for sleep).     Marland Kitchen HYDROcodone-acetaminophen (NORCO) 10-325 MG tablet Take 1 tablet by mouth every 4 (four) hours as needed for moderate pain or severe pain. (Patient not taking: Reported on 01/14/2018) 60 tablet 0  . meclizine (ANTIVERT) 25 MG tablet Take 25 mg by mouth 3 (three) times daily as needed for dizziness.    . mirtazapine (REMERON) 30 MG tablet Take 1 tablet (30 mg total) by mouth at bedtime. (Patient not taking: Reported on 01/14/2018) 90 tablet 1  . ondansetron (ZOFRAN) 4 MG tablet Take 4 mg by mouth every 6 (six) hours as needed for nausea or vomiting.    . polyethylene glycol (MIRALAX / GLYCOLAX) packet Take 17 g by mouth daily. (Patient not taking: Reported on 01/14/2018) 14 each 0  . prochlorperazine (COMPAZINE) 10 MG  tablet Take 1 tablet (10 mg total) by mouth every 6 (six) hours as needed (Nausea or vomiting). (Patient not taking: Reported on 01/14/2018) 30 tablet 1    Allergy: No Known Allergies  Social Hx:  Social History        Socioeconomic History  . Marital status: Married    Spouse name: Not on file  . Number of children: 3  . Years of education: Not on file  . Highest education level: Not on file  Occupational History  . Occupation: retired  Scientific laboratory technician  . Financial resource strain: Not on file  . Food insecurity:    Worry: Not on file    Inability: Not on file  . Transportation needs:    Medical: Not on file    Non-medical: Not on file   Tobacco Use  . Smoking status: Current Some Day Smoker    Packs/day: 1.00    Years: 55.00    Pack years: 55.00  . Smokeless tobacco: Never Used  Substance and Sexual Activity  . Alcohol use: Yes    Comment: 1 / week  . Drug use: Never  . Sexual activity: Not Currently  Lifestyle  . Physical activity:    Days per week: Not on file    Minutes per session: Not on file  . Stress: Not on file  Relationships  . Social connections:    Talks on phone: Not on file    Gets together: Not on file    Attends religious service: Not on file    Active member of club or organization: Not on file    Attends meetings of clubs or organizations: Not on file    Relationship status: Not on file  . Intimate partner violence:    Fear of current or ex partner: Not on file    Emotionally abused: Not on file    Physically abused: Not on file    Forced sexual activity: Not on file  Other Topics Concern  . Not on file  Social History Narrative  . Not on file   Past Surgical Hx:  Past Surgical History:  Procedure Laterality Date  . ABDOMINAL HYSTERECTOMY    . cardiac monitor implant    . CEREBRAL ANEURYSM REPAIR    . CERVICAL SPINE SURGERY     bone spurs  . IR FLUORO GUIDE PORT INSERTION RIGHT  10/14/2017  . IR PARACENTESIS  10/14/2017  . IR US GUIDE VASC ACCESS RIGHT  10/14/2017  . PARTIAL HYSTERECTOMY  1971   "precancer"    Past Medical Hx:  Past Medical History:  Diagnosis Date  . Anxiety and depression   . Aortic aneurysm ([place])    see ov note 03/2017   . Arthritis   . Carcinomatosis ([place]) 10/2017  . COPD (chronic obstructive pulmonary disease) ([place]) 10/23/2017  . Depression   . Family history of breast cancer   . Family history of prostate cancer   . Family history of uterine cancer   . History of diverticulitis   . HLD (hyperlipidemia) 10/23/2017  . Hypercholesterolemia   . Hypertension   . Mitral valve prolapse   . Sleep apnea    cpap machine mask and tubing in [place] per  patient.  Patient does not have mask or tubing to bring on DOS of 01/27/2018.   Marland Kitchen Uterine cancer ([place])    had partial hysterectomy at 27    Past Gynecological History:  GYNECOLOGIC HISTORY:  No LMP recorded. Patient has had a hysterectomy.  For precancer  Menarche: 72 years old  P 3  LMP 1977  Contraceptive OCP x8 years  HRT none  Last Pap N/A  Family Hx:       Family History  Problem Relation Age of Onset  . Endometrial cancer Sister 70   endometrial ca  . Lung cancer Paternal Aunt   . Breast cancer Paternal Aunt   . Breast cancer Paternal Aunt   . Stomach cancer Maternal Grandfather   . Prostate cancer Paternal Uncle   . Breast cancer Cousin   . Breast cancer Cousin   . Lung cancer Father    lung ca   Review of Systems:  Review of Systems  Constitutional: Positive for appetite change and fatigue.  HENT: Negative for tinnitus and trouble swallowing.  Eyes: Negative.  Respiratory: Negative for chest tightness, cough, shortness of breath and wheezing.  Gastrointestinal: Negative for abdominal distention, abdominal pain, constipation, nausea and vomiting.  Endocrine: Negative.  Genitourinary: Negative for pelvic pain, vaginal bleeding and vaginal discharge.  Musculoskeletal: Negative for back pain.  Skin: Negative.  Neurological: Negative.  Hematological: Negative.  Psychiatric/Behavioral: Positive for depression. Negative for confusion. The patient is not nervous/anxious.   BP (!) 142/76   Pulse 84   Temp 98.3 F (36.8 C) (Oral)   Resp 16   Ht 5\' 3"  (1.6 m)   Wt 116 lb (52.6 kg)   SpO2 98%   BMI 20.55 kg/m    General : 72 y.o., female in no apparent distress  HEENT: Normocephalic/atraumatic, symmetric, EOMI, eyelids normal  Neck: Supple, no masses.  Lymphatics: No cervical/ submandibular/ supraclavicular/ infraclavicular/ inguinal adenopathy  Respiratory: Respirations unlabored, no use of accessory muscles, chest clear to auscultation  CV: Regular rate and  rhythm musculoskeletal: No CVA tenderness, normal muscle strength.  Abdomen: Soft, non-tender, No evidence of hernia. No masses. No ascites  Extremities: No lymphedema, no erythema, non-tender.  Skin: Normal inspection  Neuro/Psych: No focal motor deficit, no abnormal mental status. Normal gait. Normal affect. Alert and oriented to person, place, and time  Genito Urinary:  Vulva: Normal external female genitalia.  Bladder/urethra: Urethral meatus normal in size and location. No lesions or masses, well supported bladder  Bimanual exam:  Cervix/Uterus: surgically absent  Adnexa: No masses.  Rectovaginal: Good tone, no palpable rectal nodularity, no cul-de-sac nodularity  Oncologic Summary:  1. 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 At initial presentation large volume ascites and positive pleural cytology was appreciated in addition to rectal wall nodularity. Jo Parker has completed 3 cycles of paclitaxel carboplatin with reduction in Ca1 25-133. Cycle #4 scheduled for December 20, 2017 with plan for robotic assisted bilateral salpingo-oophorectomy omentectomy interval debulking on January 27, 2018. The risks of the procedure were discussed with Jo Parker and her family were that of infection bleeding damage to surrounding structures prolonged hospitalization and reoperation. They understand that the procedure may be converted to exploratory laparotomy, and that a mini laparotomy will be required for specimen removal and perhaps completion of the omentectomy.  2. Discontinue aspirin use 10 days prior to surgery

## 2018-01-27 NOTE — Interval H&P Note (Signed)
History and Physical Interval Note:  01/27/2018 7:18 AM  Jo Parker  has presented today for surgery, with the diagnosis of PERITONEAL CANCER  The various methods of treatment have been discussed with the patient and family. After consideration of risks, benefits and other options for treatment, the patient has consented to  Procedure(s): XI ROBOTIC ASSISTED BILATERAL SALPINGO OOPHORECTOMY (Bilateral) LAPAROSCOPIC OMENTECTOMY (N/A) DEBULKING (N/A) POSSIBLE LAPAROTOMY (N/A) as a surgical intervention .  The patient's history has been reviewed, patient examined, no change in status, stable for surgery.  I have reviewed the patient's chart and labs.  Questions were answered to the patient's satisfaction.     [name]

## 2018-01-28 ENCOUNTER — Encounter: Payer: Self-pay | Admitting: Oncology

## 2018-01-28 ENCOUNTER — Encounter (HOSPITAL_COMMUNITY): Payer: Self-pay | Admitting: Gynecologic Oncology

## 2018-01-28 LAB — BASIC METABOLIC PANEL
ANION GAP: 7 (ref 5–15)
ANION GAP: 4 — AB (ref 5–15)
BUN: 19 mg/dL (ref 8–23)
BUN: 28 mg/dL — AB (ref 8–23)
CALCIUM: 8.4 mg/dL — AB (ref 8.9–10.3)
CALCIUM: 8.1 mg/dL — AB (ref 8.9–10.3)
CHLORIDE: 108 mmol/L (ref 98–111)
CO2: 23 mmol/L (ref 22–32)
CO2: 26 mmol/L (ref 22–32)
CREATININE: 0.63 mg/dL (ref 0.44–1.00)
Chloride: 107 mmol/L (ref 98–111)
Creatinine, Ser: 1.25 mg/dL — ABNORMAL HIGH (ref 0.44–1.00)
GFR calc Af Amer: 49 mL/min — ABNORMAL LOW (ref >60)
GFR calc non Af Amer: >60 mL/min (ref >60)
GFR, EST NON AFRICAN AMERICAN: 42 mL/min — AB (ref >60)
Glucose, Bld: 176 mg/dL — ABNORMAL HIGH (ref 70–99)
Glucose, Bld: 134 mg/dL — ABNORMAL HIGH (ref 70–99)
POTASSIUM: 4.7 mmol/L (ref 3.5–5.1)
Potassium: 4.2 mmol/L (ref 3.5–5.1)
SODIUM: 138 mmol/L (ref 135–145)
SODIUM: 137 mmol/L (ref 135–145)

## 2018-01-28 LAB — CBC
HCT: 26.3 % — ABNORMAL LOW (ref 36.0–46.0)
HCT: 25 % — ABNORMAL LOW (ref 36.0–46.0)
Hemoglobin: 8.4 g/dL — ABNORMAL LOW (ref 12.0–15.0)
Hemoglobin: 7.9 g/dL — ABNORMAL LOW (ref 12.0–15.0)
MCH: 22.5 pg — ABNORMAL LOW (ref 26.0–34.0)
MCH: 22.4 pg — ABNORMAL LOW (ref 26.0–34.0)
MCHC: 31.9 g/dL (ref 30.0–36.0)
MCHC: 31.6 g/dL (ref 30.0–36.0)
MCV: 70.5 fL — ABNORMAL LOW (ref 78.0–100.0)
MCV: 71 fL — ABNORMAL LOW (ref 78.0–100.0)
PLATELETS: 108 10*3/uL — AB (ref 150–400)
Platelets: 114 10*3/uL — ABNORMAL LOW (ref 150–400)
RBC: 3.73 MIL/uL — ABNORMAL LOW (ref 3.87–5.11)
RBC: 3.52 MIL/uL — ABNORMAL LOW (ref 3.87–5.11)
RDW: 17.6 % — ABNORMAL HIGH (ref 11.5–15.5)
RDW: 17.1 % — ABNORMAL HIGH (ref 11.5–15.5)
WBC: 4.5 10*3/uL (ref 4.0–10.5)
WBC: 6.9 10*3/uL (ref 4.0–10.5)

## 2018-01-28 LAB — URINALYSIS, ROUTINE W REFLEX MICROSCOPIC
Bilirubin Urine: NEGATIVE
GLUCOSE, UA: NEGATIVE mg/dL
Ketones, ur: NEGATIVE mg/dL
LEUKOCYTES UA: NEGATIVE
NITRITE: NEGATIVE
PH: 5 (ref 5.0–8.0)
PROTEIN: 100 mg/dL — AB
SPECIFIC GRAVITY, URINE: 1.011 (ref 1.005–1.030)

## 2018-01-28 LAB — HEMOGLOBIN AND HEMATOCRIT, BLOOD
HEMATOCRIT: 25.6 % — AB (ref 36.0–46.0)
HEMOGLOBIN: 8 g/dL — AB (ref 12.0–15.0)

## 2018-01-28 MED ORDER — ACETAMINOPHEN 500 MG PO TABS
1000.0000 mg | ORAL_TABLET | Freq: Four times a day (QID) | ORAL | Status: DC
  Administered 2018-01-28 – 2018-01-30 (×7): 1000 mg via ORAL
  Filled 2018-01-28 (×8): qty 2

## 2018-01-28 MED ORDER — SODIUM CHLORIDE 0.9 % IV BOLUS
500.0000 mL | Freq: Once | INTRAVENOUS | Status: AC
  Administered 2018-01-28: 500 mL via INTRAVENOUS

## 2018-01-28 MED ORDER — NALOXONE HCL 0.4 MG/ML IJ SOLN: 0.4000 mg | INTRAMUSCULAR | Status: DC | PRN

## 2018-01-28 MED ORDER — ACETAMINOPHEN 500 MG PO TABS
1000.0000 mg | ORAL_TABLET | Freq: Three times a day (TID) | ORAL | Status: DC
  Administered 2018-01-28: 1000 mg via ORAL
  Filled 2018-01-28: qty 2

## 2018-01-28 MED ORDER — SODIUM CHLORIDE 0.9 % IV SOLN
INTRAVENOUS | Status: DC
  Administered 2018-01-29: 150 mL/h via INTRAVENOUS

## 2018-01-28 MED ORDER — NALOXONE HCL 0.4 MG/ML IJ SOLN
0.4000 mg | INTRAMUSCULAR | Status: AC
  Administered 2018-01-28: 0.4 mg via INTRAVENOUS
  Filled 2018-01-28: qty 1

## 2018-01-28 MED ORDER — SODIUM CHLORIDE 0.9 % IV BOLUS: 250.0000 mL | Freq: Once | INTRAVENOUS | Status: DC

## 2018-01-28 MED ORDER — ASPIRIN EC 81 MG PO TBEC
81.0000 mg | DELAYED_RELEASE_TABLET | Freq: Every day | ORAL | Status: DC
  Administered 2018-01-29 – 2018-01-30 (×2): 81 mg via ORAL
  Filled 2018-01-28 (×2): qty 1

## 2018-01-28 MED ORDER — BOOST PLUS PO LIQD
237.0000 mL | Freq: Three times a day (TID) | ORAL | Status: DC
  Administered 2018-01-29 – 2018-01-30 (×6): 237 mL via ORAL
  Filled 2018-01-28 (×8): qty 237

## 2018-01-28 NOTE — Progress Notes (Signed)
1 Day Post-Op Procedure(s) (LRB): OMENTECTOMY (N/A) RADICAL DEBULKING (N/A) EXPLORATORY LAPAROTOMY WITH REMOVAL OF BILATERAL TUBES AND OVARIES (N/A)  1534/1534-01  Past Medical History:  Diagnosis Date  . Anxiety and depression   . Aortic aneurysm ([place])    see ov note 03/2017   . Arthritis   . Carcinomatosis ([place]) 10/2017  . COPD (chronic obstructive pulmonary disease) ([place]) 10/23/2017  . Depression   . Family history of breast cancer   . Family history of prostate cancer   . Family history of uterine cancer   . History of diverticulitis   . HLD (hyperlipidemia) 10/23/2017  . Hypercholesterolemia   . Hypertension   . Mitral valve prolapse   . Sleep apnea    cpap machine mask and tubing in [place] per patient.  Patient does not have mask or tubing to bring on DOS of 01/27/2018.   Marland Kitchen Uterine cancer ([place])    had partial hysterectomy at 27     Subjective: Family reports somnolence. Wakes to voice command. Not oriented. No distress. Occasional apnea per family  Objective: Vital signs in last 24 hours: Temp:  [98.3 F (36.8 C)-99.6 F (37.6 C)] 98.5 F (36.9 C) (08/23 1451) Pulse Rate:  [82-100] 82 (08/23 1451) Resp:  [11-21] 11 (08/23 1451) BP: (91-163)/(53-87) 91/54 (08/23 1451) SpO2:  [98 %-100 %] 100 % (08/23 1451) Last BM Date: 01/27/18  Intake/Output from previous day: 08/22 0701 - 08/23 0700 In: 3070.1 [P.O.:300; I.V.:2670.1; IV Piggyback:100] Out: 1325 [Urine:875; Blood:450] No data found.   Physical Examination: General: somnolent Resp: clear to auscultation bilaterally Cardio: regular rate and rhythm GI: hypoactive BS. Soft, NTND on exam Extremities: extremities normal, atraumatic, no cyanosis or edema  Labs: WBC/Hgb/Hct/Plts:  --/8.0/25.6/-- (08/23 1152) BUN/Cr/glu/ALT/AST/amyl/lip:  28/1.25/--/--/--/--/-- (08/23 1523)  CBC Latest Ref Rng & Units 01/28/2018 01/28/2018 01/27/2018  WBC 4.0 - 10.5 K/uL - 4.5 -  Hemoglobin 12.0 - 15.0 g/dL 8.0(L) 8.4(L)  10.2(L)  Hematocrit 36.0 - 46.0 % 25.6(L) 26.3(L) 30.0(L)  Platelets 150 - 400 K/uL - 114(L) -   BMP Latest Ref Rng & Units 01/28/2018 01/28/2018 01/27/2018  Glucose 70 - 99 mg/dL 134(H) 176(H) 133(H)  BUN 8 - 23 mg/dL 28(H) 19 -  Creatinine 0.44 - 1.00 mg/dL 1.25(H) 0.63 -  Sodium 135 - 145 mmol/L 137 138 141  Potassium 3.5 - 5.1 mmol/L 4.7 4.2 3.7  Chloride 98 - 111 mmol/L 107 108 -  CO2 22 - 32 mmol/L 26 23 -  Calcium 8.9 - 10.3 mg/dL 8.1(L) 8.4(L) -      Assessment/Plan:  72 y.o. s/p Procedure(s): OMENTECTOMY RADICAL DEBULKING EXPLORATORY LAPAROTOMY WITH REMOVAL OF BILATERAL TUBES AND OVARIES:   Pain:  Pain is controlled on IV and/or oral medications.  Neuro: now with somnolence.Will admin narcan x 1. If no resolution will scan brain.  Pulm: h/o sleep apnea, no CPAP overnight or current. Will get Respiratory therapy to bring. On continuous pulse ox for now.   Heme: Anemia: recheck at noon showed slight drift, but no signficant change from this am. Recheck this afternoon.  CV:  No issues  Prophylaxis: pharmacologic prophylaxis (with any of the following: Lovenox and SCD).  GI:    . Tolerating po: Yes but minimal intake due to somnolence . GI stress ulcer prophylaxis includes: none currently. . Treatment for N/V: zofran PRN.  GU: . Suspect hypovolemic - decreased urine output and increased creatinine  o Will give IVF bolus 500cc and increase maintenance IVF  FEN:  . IVF  change to NS at 100cc/hr . Elec Repeat in am, watch for ATN . Diet minimal intake. Written for soft now  Endo: no issues  ID: no issues   Will followup on response to Narcan  Consults: none   LOS: 1 day    Isabel Caprice 01/28/2018, 4:05 PM

## 2018-01-28 NOTE — Progress Notes (Signed)
Initial Nutrition Assessment  DOCUMENTATION CODES:   Not applicable  INTERVENTION:   Boost Plus chocolate TID- Each supplement provides 360kcal and 14g protein.    NUTRITION DIAGNOSIS:   Increased nutrient needs related to post-op healing as evidenced by estimated needs.  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 Presents this admission for robotic assisted bilateral salpingo-oophorectomy omentectomy interval debulking.    8/22- Exploratory laparotomy infra-gastric omentectomy bilateral salpingo-oophorectomy radical optimal tumor debulking, resection of anterior abdominal wall   Pt denies having a loss in appetite PTA. States she typically eats two large meals each day that consist of good protein sources like meats, eggs, and dairy. She drinks 2-3 Boost Plus/day. She denies any taste changes or difficulty swallowing. She is currently on a soft diet. RD observed breakfast tray with a couple bites of sausage and eggs consumed. Pt previously followed by [place] RD. Will provide pt with Boost Plus as she wishes to continue these.   Pt endorses a UBW of 114 lb and denies any recent wt loss. Records indicate pt's weight has remained stable since March 2019. Nutrition-Focused physical exam completed.   Medications reviewed and include: D5 with 20 mEq KCl @ 50 ml/hr Labs reviewed.   NUTRITION - FOCUSED PHYSICAL EXAM:    Most Recent Value  Orbital Region  No depletion  Upper Arm Region  Mild depletion  Thoracic and Lumbar Region  Unable to assess  Buccal Region  No depletion  Temple Region  No depletion  Clavicle Bone Region  Mild depletion  Clavicle and  Acromion Bone Region  Mild depletion  Scapular Bone Region  Unable to assess  Dorsal Hand  No depletion  Patellar Region  No depletion  Anterior Thigh Region  No depletion  Posterior Calf Region  No depletion  Edema (RD Assessment)  None  Hair  Reviewed  Eyes  Reviewed  Mouth  Reviewed  Skin  Reviewed  Nails  Reviewed     Diet Order:   Diet Order            DIET SOFT Room service appropriate? Yes; Fluid consistency: Thin  Diet effective now              EDUCATION NEEDS:   Education needs have been addressed  Skin:  Skin Assessment: Skin Integrity Issues: Skin Integrity Issues:: Incisions Incisions: closed abdomen   Last BM:  01/27/18  Height:   Ht Readings from Last 1 Encounters:  01/27/18 5\' 3"  (1.6 m)    Weight:   Wt Readings from Last 1 Encounters:  01/27/18 52.6 kg    Ideal Body Weight:  52.3 kg  BMI:  Body mass index is 20.55 kg/m.  Estimated Nutritional Needs:   Kcal:  1500-1700 kcal  Protein:  75-90 grams  Fluid:  >/= 1.5 L/day  Mariana Single RD, LDN Clinical Nutrition Pager # - [phone]

## 2018-01-28 NOTE — Progress Notes (Signed)
1 Day Post-Op Procedure(s) (LRB): OMENTECTOMY (N/A) RADICAL DEBULKING (N/A) EXPLORATORY LAPAROTOMY WITH REMOVAL OF BILATERAL TUBES AND OVARIES (N/A)  1534/1534-01  Past Medical History:  Diagnosis Date  . Anxiety and depression   . Aortic aneurysm ([place])    see ov note 03/2017   . Arthritis   . Carcinomatosis ([place]) 10/2017  . COPD (chronic obstructive pulmonary disease) ([place]) 10/23/2017  . Depression   . Family history of breast cancer   . Family history of prostate cancer   . Family history of uterine cancer   . History of diverticulitis   . HLD (hyperlipidemia) 10/23/2017  . Hypercholesterolemia   . Hypertension   . Mitral valve prolapse   . Sleep apnea    cpap machine mask and tubing in [place] per patient.  Patient does not have mask or tubing to bring on DOS of 01/27/2018.   Marland Kitchen Uterine cancer ([place])    had partial hysterectomy at 27     Subjective: Patient reports Pain this morning. Diffuse abdomen. Last oxy was 2am nurse has been called as I walked in. No N/V. No chest pain, no SOB. No other complaints.     Objective: Vital signs in last 24 hours: Temp:  [97 F (36.1 C)-99.6 F (37.6 C)] 99.6 F (37.6 C) (08/23 0548) Pulse Rate:  [74-100] 98 (08/23 0548) Resp:  [12-18] 18 (08/23 0548) BP: (101-163)/(53-87) 110/64 (08/23 0548) SpO2:  [99 %-100 %] 100 % (08/23 0548) Last BM Date: 01/27/18  Intake/Output from previous day: 08/22 0701 - 08/23 0700 In: 3070.1 [P.O.:300; I.V.:2670.1; IV Piggyback:100] Out: 1325 [Urine:875; Blood:450] No data found.   Physical Examination: General: mild distress Resp: clear to auscultation bilaterally Cardio: regular rate and rhythm GI: incision: clean, dry and intact and sfot, NTND. Diminished bowel sounds Extremities: extremities normal, atraumatic, no cyanosis or edema  Labs: WBC/Hgb/Hct/Plts:  4.5/8.4/26.3/114 (08/23 0405) BUN/Cr/glu/ALT/AST/amyl/lip:  19/0.63/--/--/--/--/-- (08/23 0405)  CBC Latest Ref Rng & Units  01/28/2018 01/27/2018 01/26/2018  WBC 4.0 - 10.5 K/uL 4.5 - 4.1  Hemoglobin 12.0 - 15.0 g/dL 8.4(L) 10.2(L) 10.5(L)  Hematocrit 36.0 - 46.0 % 26.3(L) 30.0(L) 33.6(L)  Platelets 150 - 400 K/uL 114(L) - 151   BMP Latest Ref Rng & Units 01/28/2018 01/27/2018 01/26/2018  Glucose 70 - 99 mg/dL 176(H) 133(H) 97  BUN 8 - 23 mg/dL 19 - 18  Creatinine 0.44 - 1.00 mg/dL 0.63 - 0.86  Sodium 135 - 145 mmol/L 138 141 141  Potassium 3.5 - 5.1 mmol/L 4.2 3.7 3.9  Chloride 98 - 111 mmol/L 108 - 103  CO2 22 - 32 mmol/L 23 - 30  Calcium 8.9 - 10.3 mg/dL 8.4(L) - 9.1      Assessment/Plan:  72 y.o. s/p Procedure(s): OMENTECTOMY RADICAL DEBULKING EXPLORATORY LAPAROTOMY WITH REMOVAL OF BILATERAL TUBES AND OVARIES: stable  Pain:  Pain is not currently controlled on IV and/or oral medications. However medication is being given right now. I adjusted the tylenol to be given more frequently  Neuro: no issues  Pulm: no issues  Heme: Anemia: Will recheck H/H later today. UO is borderline.  CV:  Some episodes of HTN. Monitor   Prophylaxis: Lovenox + SCD  GI:    . Tolerating po: Yes    . Awaiting bowel function . GI stress ulcer prophylaxis includes: currently none. . Treatment for N/V: zofran PRN.  GU: . Borderline but adequate urine output  FEN:  . IVF maintain at 50cc given borderline UO . Elec normal . Diet as tolerated for  now  Endo: N/A.  Marland Kitchen  ID: no issues   Adjust pain meds. Ambulate today. Repeat H/H at noon. Continue IVF for now.  Consults:   The patient is to be discharged to home when stable and bowel function returns.   LOS: 1 day    Isabel Caprice 01/28/2018, 8:39 AM

## 2018-01-28 NOTE — Progress Notes (Signed)
Patient assessed.  RN at the bedside placing foley.  Patient sleeping, responding to verbal stimuli but falling back to sleep a few seconds later.  100% on 2 L with pulse 86.  When awake, she states her pain is better.  Stat labs ordered.  Dr. Gerarda Fraction made aware.  500 cc NS bolus infusing at this time.

## 2018-01-28 NOTE — Progress Notes (Signed)
Patient alert, oriented resting in bed after narcan administration.  Family at bedside.  Patient reporting some abdominal discomfort but joking around with family and conversing.  IVF changed to NS at 100.  Am labs ordered.

## 2018-01-28 NOTE — Plan of Care (Signed)
Plan of care discussed with patient and family 

## 2018-01-28 NOTE — Progress Notes (Signed)
Pt refused CPAP qhs.  Pt encouraged to contact RT should she change her mind.  

## 2018-01-28 NOTE — Progress Notes (Signed)
Patient in bed sleeping.  Will respond to verbal stimuli for a few seconds then falling back to sleep. Speech slurred when awake.  Equal facial symmetry and equal grip strength bilaterally.  Able to lift arms to equal height along with both legs.  Husband and son at the bedside stated she has been this sleeping since surgery.  Stating she is not taking in much due to drowsiness.  Slightly hypotensive, resp 16.  Due to void since foley removal. Will discuss exam findings with Dr. Gerarda Fraction. No new orders at this time.

## 2018-01-29 ENCOUNTER — Inpatient Hospital Stay (HOSPITAL_COMMUNITY): Payer: Medicare PPO

## 2018-01-29 DIAGNOSIS — D649 Anemia, unspecified: Secondary | ICD-10-CM

## 2018-01-29 DIAGNOSIS — N179 Acute kidney failure, unspecified: Secondary | ICD-10-CM | POA: Diagnosis present

## 2018-01-29 DIAGNOSIS — I10 Essential (primary) hypertension: Secondary | ICD-10-CM

## 2018-01-29 DIAGNOSIS — E785 Hyperlipidemia, unspecified: Secondary | ICD-10-CM

## 2018-01-29 DIAGNOSIS — J449 Chronic obstructive pulmonary disease, unspecified: Secondary | ICD-10-CM

## 2018-01-29 DIAGNOSIS — R0600 Dyspnea, unspecified: Secondary | ICD-10-CM

## 2018-01-29 DIAGNOSIS — G9341 Metabolic encephalopathy: Secondary | ICD-10-CM | POA: Diagnosis present

## 2018-01-29 LAB — CK: Total CK: 197 U/L (ref 38–234)

## 2018-01-29 LAB — COMPREHENSIVE METABOLIC PANEL
ALT: 20 U/L (ref 0–44)
ANION GAP: 7 (ref 5–15)
AST: 32 U/L (ref 15–41)
Albumin: 3 g/dL — ABNORMAL LOW (ref 3.5–5.0)
Alkaline Phosphatase: 64 U/L (ref 38–126)
BUN: 28 mg/dL — ABNORMAL HIGH (ref 8–23)
CALCIUM: 8.3 mg/dL — AB (ref 8.9–10.3)
CHLORIDE: 112 mmol/L — AB (ref 98–111)
CO2: 21 mmol/L — AB (ref 22–32)
CREATININE: 1.14 mg/dL — AB (ref 0.44–1.00)
GFR calc non Af Amer: 47 mL/min — ABNORMAL LOW (ref >60)
GFR, EST AFRICAN AMERICAN: 54 mL/min — AB (ref >60)
Glucose, Bld: 117 mg/dL — ABNORMAL HIGH (ref 70–99)
Potassium: 4.1 mmol/L (ref 3.5–5.1)
SODIUM: 140 mmol/L (ref 135–145)
Total Bilirubin: 1.2 mg/dL (ref 0.3–1.2)
Total Protein: 5.7 g/dL — ABNORMAL LOW (ref 6.5–8.1)

## 2018-01-29 LAB — LACTATE DEHYDROGENASE: LDH: 240 U/L — AB (ref 98–192)

## 2018-01-29 LAB — BLOOD GAS, VENOUS
Acid-base deficit: 4.7 mmol/L — ABNORMAL HIGH (ref 0.0–2.0)
BICARBONATE: 20.5 mmol/L (ref 20.0–28.0)
FIO2: 21
O2 SAT: 90.7 %
PATIENT TEMPERATURE: 98.6
PO2 VEN: 62.3 mmHg — AB (ref 32.0–45.0)
pCO2, Ven: 41.3 mmHg — ABNORMAL LOW (ref 44.0–60.0)
pH, Ven: 7.317 (ref 7.250–7.430)

## 2018-01-29 LAB — CBC
HCT: 22 % — ABNORMAL LOW (ref 36.0–46.0)
Hemoglobin: 6.7 g/dL — CL (ref 12.0–15.0)
MCH: 22 pg — ABNORMAL LOW (ref 26.0–34.0)
MCHC: 30.5 g/dL (ref 30.0–36.0)
MCV: 72.1 fL — ABNORMAL LOW (ref 78.0–100.0)
Platelets: 94 10*3/uL — ABNORMAL LOW (ref 150–400)
RBC: 3.05 MIL/uL — ABNORMAL LOW (ref 3.87–5.11)
RDW: 17 % — ABNORMAL HIGH (ref 11.5–15.5)
WBC: 4.4 10*3/uL (ref 4.0–10.5)

## 2018-01-29 LAB — CBC WITH DIFFERENTIAL/PLATELET
Basophils Absolute: 0 10*3/uL (ref 0.0–0.1)
Basophils Relative: 0 %
EOS PCT: 2 %
Eosinophils Absolute: 0.1 10*3/uL (ref 0.0–0.7)
HEMATOCRIT: 27.1 % — AB (ref 36.0–46.0)
Hemoglobin: 8.6 g/dL — ABNORMAL LOW (ref 12.0–15.0)
LYMPHS ABS: 0.6 10*3/uL — AB (ref 0.7–4.0)
LYMPHS PCT: 18 %
MCH: 23.3 pg — ABNORMAL LOW (ref 26.0–34.0)
MCHC: 31.7 g/dL (ref 30.0–36.0)
MCV: 73.4 fL — AB (ref 78.0–100.0)
MONO ABS: 0.2 10*3/uL (ref 0.1–1.0)
MONOS PCT: 6 %
NEUTROS ABS: 2.6 10*3/uL (ref 1.7–7.7)
Neutrophils Relative %: 74 %
PLATELETS: 88 10*3/uL — AB (ref 150–400)
RBC: 3.69 MIL/uL — ABNORMAL LOW (ref 3.87–5.11)
RDW: 18.2 % — AB (ref 11.5–15.5)
WBC: 3.5 10*3/uL — ABNORMAL LOW (ref 4.0–10.5)

## 2018-01-29 LAB — AMMONIA: AMMONIA: 18 umol/L (ref 9–35)

## 2018-01-29 LAB — BASIC METABOLIC PANEL
ANION GAP: 4 — AB (ref 5–15)
BUN: 28 mg/dL — ABNORMAL HIGH (ref 8–23)
CALCIUM: 7.9 mg/dL — AB (ref 8.9–10.3)
CO2: 22 mmol/L (ref 22–32)
CREATININE: 0.98 mg/dL (ref 0.44–1.00)
Chloride: 113 mmol/L — ABNORMAL HIGH (ref 98–111)
GFR calc Af Amer: >60 mL/min (ref >60)
GFR, EST NON AFRICAN AMERICAN: 56 mL/min — AB (ref >60)
Glucose, Bld: 106 mg/dL — ABNORMAL HIGH (ref 70–99)
Potassium: 4.3 mmol/L (ref 3.5–5.1)
Sodium: 139 mmol/L (ref 135–145)

## 2018-01-29 LAB — PHOSPHORUS: Phosphorus: 2.9 mg/dL (ref 2.5–4.6)

## 2018-01-29 LAB — PREPARE RBC (CROSSMATCH)

## 2018-01-29 LAB — CREATININE, URINE, RANDOM: CREATININE, URINE: 85.99 mg/dL

## 2018-01-29 LAB — NA AND K (SODIUM & POTASSIUM), RAND UR: POTASSIUM UR: 25 mmol/L

## 2018-01-29 LAB — TROPONIN I: Troponin I: <0.03 ng/mL (ref <0.03)

## 2018-01-29 LAB — LACTIC ACID, PLASMA: Lactic Acid, Venous: 1.4 mmol/L (ref 0.5–1.9)

## 2018-01-29 LAB — MAGNESIUM: Magnesium: 2 mg/dL (ref 1.7–2.4)

## 2018-01-29 MED ORDER — SODIUM CHLORIDE 0.9% IV SOLUTION
Freq: Once | INTRAVENOUS | Status: AC
  Administered 2018-01-29: 11:00:00 via INTRAVENOUS

## 2018-01-29 MED ORDER — SODIUM CHLORIDE 0.9 % IV SOLN
INTRAVENOUS | Status: DC
  Administered 2018-01-29 – 2018-01-30 (×2): via INTRAVENOUS

## 2018-01-29 MED ORDER — NALOXONE HCL 0.4 MG/ML IJ SOLN
0.4000 mg | INTRAMUSCULAR | Status: DC | PRN
  Administered 2018-01-29: 0.4 mg via INTRAVENOUS
  Filled 2018-01-29: qty 1

## 2018-01-29 MED ORDER — SODIUM CHLORIDE 0.9 % IV BOLUS
500.0000 mL | Freq: Once | INTRAVENOUS | Status: AC
  Administered 2018-01-29: 500 mL via INTRAVENOUS

## 2018-01-29 MED ORDER — SODIUM CHLORIDE 0.9 % IV BOLUS: 250.0000 mL | Freq: Once | INTRAVENOUS | Status: DC

## 2018-01-29 MED ORDER — DEXTROSE-NACL 5-0.45 % IV SOLN
INTRAVENOUS | Status: DC
  Administered 2018-01-29: 09:00:00 via INTRAVENOUS

## 2018-01-29 MED ORDER — BISACODYL 10 MG RE SUPP: 10.0000 mg | Freq: Every day | RECTAL | Status: DC | PRN

## 2018-01-29 NOTE — Progress Notes (Signed)
Patient's urine output is still low,  Receiving IVF at 150cc/o.  Notified MD of this last night.  Nurse also notified MD of hgb this am of 6.7.  MD aware.  Reported same to on coming nurse.  Patient was encouraged to sit recliner today as she feels too weak to walk around.

## 2018-01-29 NOTE — Consult Note (Signed)
[facility] Medical Consultation  Jo Parker ZCH:[id number] DOB: [birth year] DOA: 01/27/2018 PCP: [name], MD   Requesting physician: Dr.Jackson-Moore Date of consultation: 01/29/18  Reason for consultation: AKI, acute encephalopathy  Impression/Recommendations 72 year old female with history of ovarian cancer with primary peritoneal carcinomatosis, COPD, OSA on CPAP, white coat hypertension syndrome, AAA. HLD, history of uterine cancer status post partial hysterectomy in 27  Active Problems:   Severe protein-calorie malnutrition ([name])   COPD (chronic obstructive pulmonary disease) ([name])   HLD (hyperlipidemia)   Absolute anemia   White coat syndrome with hypertension   Ovarian cancer ([place])   AKI (acute kidney injury) ([place])     1. Increase in respiratory work of breathing -patient was noted to have increased work of breathing physical exam showed some excessive muscle use, per family patient has been having intermittent episodes of dyspnea with minimal activity.  Chest x-ray was repeated given recent fluid bolus and showed no evidence of worsening fluid overload.  Patient apparently did not receive her Lovenox today will Obtain CTA of the chest given recent intra-abdominal surgery and malignancy with tachycardia hypotension and unclear etiology of shortness of breath. Given vital sign instability significant dyspnea and increased work of breathing will move to stepdown to observe overnight. Troponin within normal limits EKG showing no acute ischemia    2.   Acute metabolic encephalopathy -appears to be in delirium.  Suspect due to opioids, Yesterday improved with Narcan today ha Oxycodone at 7:53 AM and progressively gotten more confused.  On initial exam not alert or oriented. VBG with no significant CO2 retention. Gave narcan with good response again now alert oriented to self situation and place. Would avoid narcotics.  Held off on Ultram for today patient appears to be  more comfortable and reassured  3.  Transient postoperative AKI currently creatinine has been coming down with IV fluids.  Patient seen and evaluated administer another bolus 500 and ordered normal saline at 100 being mindful to avoid fluid overload orthostatics checked and patient was noted to be significantly orthostatic with blood pressure going from 140s laying down to 118 standing up.  After IV fluid  administration urine output has improved For now continue IV fluids until able to tolerate good p.o.'s would would need to carefully monitor fluid status given history of diastolic CHF  4. History of diastolic CHF currently stable somewhat on the dry side  5.  Ovarian cancer as per primary team  6 .History of COPD-currently appears to be stable although could be contributing to dyspnea currently no wheezing noted make sure we will have Xopenex available as needed and scheduled DuoNeb. Incentive spirometry already ordered  7.  Postoperative anemia repeat CBC showing appropriate response to recent blood transfusion continue to monitor  Constipation in the setting of recent intra-abdominal surgery and narcotics.  After Narcan patient had a bowel movement.  Would attempt to avoid narcotics if possible,  bowel regimen ordered  I will followup again tomorrow. Please contact me if I can be of assistance in the meanwhile. Thank you for this consultation.  Chief Complaint: Admitted for surgical debulking of carcinomatosis likely secondary to ovarian malignancy  HPI:  She is a 72 year old female with history of ovarian cancer with primary peritoneal carcinomatosis, COPD, OSA on CPAP, white coat hypertension syndrome, AAA. HLD, history of uterine cancer status post partial hysterectomy in 27 In March 2019 patient was found to have incidental finding of cirrhosis liver and ascites initially treated with diuretics but then did not improve  CT of the scan abdomen and pelvis showed carcinomatosis with  peritoneal thickening CA 125 was elevated and thoracentesis was positive for adenocarcinoma.  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  August 22 patient undergone Exploratory laparotomy infra-gastric omentectomy bilateral salpingo-oophorectomy radical optimal tumor debulking, resection of anterior abdominal wall by Dr. Skeet Latch. 500 ml blood loss.  Yesterday (POD2) she was noted to be more lethargic. After receiving Oxy IR she received Narcan and seemed to be more alert since then.  Noted to have decreased urinary out put. Episode of hypotension with systolic's down to 85'O.  She was attempted to be rehydrated with IV fluids running 115-hour In 1800 out 540 ml Today a bolus of 500 normal saline followed by 250 was ordered but the latter have not been given at this time She was written for D5 1/2NS @125  Has been trending down initially on admission hg 10.2 postoperatively down to 8.0 and this morning down to 6.7 after receiving IV fluids today she received  1 unit of PRBC  Platelets has also trended down from 114 on 22nd down to 94 today Last BM was prior to admission.  At baseline patient creatinine around 0.73 on Thursday postop went up to 1.25 after IV fluids this morning was down to 0.98 But this afternoon her creatinine was back up again to 1.14 anion gap 7 Bicarb 21  Family noted the patient has been in and out of confusion at times  she is more alert and oriented but then becomes more confused and afternoon family noted that she had oxycodone this morning and then sometime thereafter became more progressively confused similar to yesterday.  At her baseline patient able to drive pay bills very functional.  Family states that currently she is not aware where she is that she feels she is at home with her family as opposed to in the hospital.  Patient is talking about shopping at [employer]. Patient patient reported significant shortness of breath worsening with any activity.  No chest pain.  But have been noted to be tachycardic and transiently hypotensive. Orthostatics were attempted blood pressure dropped from 140s laying down to 118 sitting attempted to stand up but patient appeared to be visibly unwell and was placed back in bed.  After IV fluid bolus urine output has improved Review of Systems:   Review of Systems:,   Pertinent positives include: fatigue shortness of breath at rest.  dyspnea on exertion, constipation, confusion  Constitutional:  No weight loss, night sweats, Fevers, chills, weight loss  HEENT:  No headaches, Difficulty swallowing,Tooth/dental problems,Sore throat,  No sneezing, itching, ear ache, nasal congestion, post nasal drip,  Cardio-vascular:  No chest pain, Orthopnea, PND, anasarca, dizziness, palpitations.no Bilateral lower extremity swelling  GI:  No heartburn, indigestion, abdominal pain, nausea, vomiting, diarrhea, change in bowel habits, loss of appetite, melena, blood in stool, hematemesis Resp:    No excess mucus, no productive cough, No non-productive cough, No coughing up of blood.No change in color of mucus.No wheezing. Skin:  no rash or lesions.  No jaundice GU:  no dysuria, change in color of urine, no urgency or frequency. No straining to urinate.  No flank pain.  Musculoskeletal:  No joint pain or no joint swelling. No decreased range of motion. No back pain.  Psych:  No change in  mood or affect. No depression or anxiety. No memory loss.  Neuro: no localizing neurological complaints, no tingling, no weakness, no double vision, no gait abnormality, no slurred speech,    All systems reviewed and apart from [place] all are negative  Past Medical History:  Diagnosis Date  . Anxiety and depression   . Aortic aneurysm ([place])    see ov note 03/2017   . Arthritis   . Carcinomatosis ([place]) 10/2017  . COPD (chronic obstructive pulmonary disease) ([place]) 10/23/2017  . Depression   . Family history of breast cancer   . Family history of prostate cancer   . Family history of uterine cancer   . History of diverticulitis   . HLD (hyperlipidemia) 10/23/2017  . Hypercholesterolemia   . Hypertension   . Mitral valve prolapse   . Sleep apnea    cpap machine mask and tubing in [place] per patient.  Patient does not have mask or tubing to bring on DOS of 01/27/2018.   Marland Kitchen Uterine cancer ([place])    had partial hysterectomy at 27   Past Surgical History:  Procedure Laterality Date  . ABDOMINAL HYSTERECTOMY    . cardiac monitor implant    . CEREBRAL ANEURYSM REPAIR    . CERVICAL SPINE SURGERY     bone spurs  . DEBULKING N/A 01/27/2018   Procedure: RADICAL DEBULKING;  Surgeon: [name], MD;  Location: [facility];  Service: Gynecology;  Laterality: N/A;  . IR FLUORO GUIDE PORT INSERTION RIGHT  10/14/2017  . IR PARACENTESIS  10/14/2017  . IR US GUIDE VASC ACCESS RIGHT  10/14/2017  . LAPAROTOMY N/A 01/27/2018   Procedure: EXPLORATORY LAPAROTOMY WITH REMOVAL OF BILATERAL TUBES AND OVARIES;  Surgeon: [name], MD;  Location: [facility];  Service: Gynecology;  Laterality: N/A;  . OMENTECTOMY N/A 01/27/2018   Procedure: OMENTECTOMY;  Surgeon: [name], MD;  Location: [facility];  Service: Gynecology;  Laterality: N/A;  . PARTIAL HYSTERECTOMY  1971   "precancer"   Social History:  reports that she quit smoking about 3 months ago. She has a 55.00 pack-year smoking history. She has never used  smokeless tobacco. She reports that she does not drink alcohol or use drugs.  No Known Allergies Family History  Problem Relation Age of Onset  . Endometrial cancer Sister 50       endometrial ca  . Endometrial cancer Paternal Aunt 80  . Breast cancer Paternal Aunt   . Stomach cancer Maternal Grandfather        d. 47s  . Prostate cancer Paternal Uncle   . Breast cancer Cousin        pat first cousin  . Breast cancer Cousin        pat first cousin's daughter  . Lung cancer Father        lung ca  . Heart disease Mother   . Stroke Paternal Grandmother   . Endometrial cancer Cousin        paternal first cousin's daughter  . Breast cancer Other        niece dx less than 6s  . Breast cancer Other        niece, dx under 43    Prior to  Admission medications   Medication Sig Start Date End Date Taking? Authorizing Provider  aspirin EC 81 MG tablet Take 81 mg by mouth daily.   Yes [provider]  atorvastatin (LIPITOR) 80 MG tablet Take 80 mg by mouth daily.   Yes [provider]  budesonide-formoterol (SYMBICORT) 160-4.5 MCG/ACT inhaler Inhale 2 puffs into the lungs 2 (two) times daily. Use two puffs by mouth twice daily(rinse mouth after use)   Yes [provider]  carvedilol (COREG) 12.5 MG tablet Take 12.5 mg by mouth 2 (two) times daily with a meal.   Yes [provider]  citalopram (CELEXA) 20 MG tablet Take 1 tablet (20 mg total) by mouth daily. 10/28/17  Yes Aline August, MD  flintstones complete (FLINTSTONES) 60 MG chewable tablet Chew 2 tablets by mouth daily.   Yes [provider]  Melatonin 5 MG CAPS Take 10 mg by mouth at bedtime as needed (for sleep).    Yes [provider]  HYDROcodone-acetaminophen (NORCO) 10-325 MG tablet Take 1 tablet by mouth every 4 (four) hours as needed for moderate pain or severe pain. Patient not taking: Reported on 01/14/2018 11/29/17   [name], MD  meclizine (ANTIVERT) 25 MG tablet Take  25 mg by mouth 3 (three) times daily as needed for dizziness.    [provider]  mirtazapine (REMERON) 30 MG tablet Take 1 tablet (30 mg total) by mouth at bedtime. Patient not taking: Reported on 01/14/2018 11/29/17   [name], MD  ondansetron (ZOFRAN) 4 MG tablet Take 4 mg by mouth every 6 (six) hours as needed for nausea or vomiting.    [provider]  polyethylene glycol (MIRALAX / GLYCOLAX) packet Take 17 g by mouth daily. Patient not taking: Reported on 01/14/2018 10/27/17   Aline August, MD  prochlorperazine (COMPAZINE) 10 MG tablet Take 1 tablet (10 mg total) by mouth every 6 (six) hours as needed (Nausea or vomiting). Patient not taking: Reported on 01/14/2018 10/13/17   [name], MD  triamcinolone (NASACORT) 55 MCG/ACT AERO nasal inhaler Place 1 spray into the nose 2 (two) times daily. Patient not taking: Reported on 01/14/2018 12/20/17   [name], MD   Physical Exam: Blood pressure (!) 102/50, pulse 86, temperature 97.8 F (36.6 C), temperature source Oral, resp. rate 11, height 5\' 3"  (1.6 m), weight 52.6 kg, SpO2 96 %. Vitals:   01/29/18 1612 01/29/18 1935  BP: (!) 102/50   Pulse: 86   Resp: 11   Temp: 97.8 F (36.6 C)   SpO2: 96% 96%     Blood pressure (!) 151/82, pulse 76, temperature 97.9 F (36.6 C), temperature source Oral, resp. rate 16, SpO2 100 %. 1. General:  in No Acute distress   Chronically ill  cachectic  -appearing 2. Psychological: Alert but not Oriented to situation or place of time 3. Head/ENT:    Dry Mucous Membranes                          Head Non traumatic, neck supple                          Poor Dentition 4. SKIN:  decreased Skin turgor,  Skin clean Dry and intact no rash 5. Heart: Rapid regular rate and rhythm no  Murmur, no Rub or gallop 6. Lungs:  Clear to auscultation bilaterally, no wheezes or crackles   7. Abdomen: Soft, appropriately tender,  distended  bowel sounds present 8. Lower extremities: no clubbing, cyanosis,  or edema 9. Neurologically  strength 5 out of 5 in all 4 extremities cranial nerves II through XII intact 10. MSK: Normal range of motion    Labs on Admission:  Basic Metabolic Panel: Recent Labs  Lab 01/26/18 1457 01/27/18 1114 01/28/18 0405 01/28/18 1523 01/29/18 0520 01/29/18 1701  NA 141 141 138 137 139 140  K 3.9 3.7 4.2 4.7 4.3 4.1  CL 103  --  108 107 113* 112*  CO2 30  --  23 26 22  21*  GLUCOSE 97 133* 176* 134* 106* 117*  BUN 18  --  19 28* 28* 28*  CREATININE 0.86  --  0.63 1.25* 0.98 1.14*  CALCIUM 9.1  --  8.4* 8.1* 7.9* 8.3*   Liver Function Tests: Recent Labs  Lab 01/26/18 1457 01/29/18 1701  AST 23 32  ALT 20 20  ALKPHOS 115 64  BILITOT 0.4 1.2  PROT 7.4 5.7*  ALBUMIN 4.0 3.0*   No results for input(s): LIPASE, AMYLASE in the last 168 hours. No results for input(s): AMMONIA in the last 168 hours. CBC: Recent Labs  Lab 01/26/18 1457 01/27/18 1114 01/28/18 0405 01/28/18 1152 01/28/18 1523 01/29/18 0520  WBC 4.1  --  4.5  --  6.9 4.4  NEUTROABS 1.5  --   --   --   --   --   HGB 10.5* 10.2* 8.4* 8.0* 7.9* 6.7*  HCT 33.6* 30.0* 26.3* 25.6* 25.0* 22.0*  MCV 69.6*  --  70.5*  --  71.0* 72.1*  PLT 151  --  114*  --  108* 94*   Cardiac Enzymes: No results for input(s): CKTOTAL, CKMB, CKMBINDEX, TROPONINI in the last 168 hours. BNP: Invalid input(s): POCBNP CBG: No results for input(s): GLUCAP in the last 168 hours.  Radiological Exams on Admission: Dg Chest 2 View  Result Date: 01/29/2018 CLINICAL DATA:  Hypertension, COPD. At risk for fluid overload. Recent laparotomy. EXAM: CHEST - 2 VIEW COMPARISON:  01/21/2018 FINDINGS: There is free air under the hemidiaphragms compatible with history of recent laparotomy. Right Port-A-Cath remains in place, unchanged as does the loop recorder device. Cardiomegaly with vascular congestion. No overt edema. Bibasilar atelectasis. IMPRESSION: Cardiomegaly, vascular congestion.  Bibasilar atelectasis.  Pneumoperitoneum, likely related to recent laparotomy. Electronically Signed   By: Rolm Baptise M.D.   On: 01/29/2018 17:30    EKG: Independently reviewed.  HR 94 NSR Left anterior Fascicular block QTC 425  Time spent: 85, of which at least 55 minutes was spent in room and face-to-face with patient and family  [facility] Pager [phone]  If 7PM-7AM, please contact night-coverage [email or web address] Password [facility] 01/29/2018, 7:52 PM

## 2018-01-29 NOTE — Progress Notes (Addendum)
2 Days Post-Op Procedure(s) (LRB): OMENTECTOMY (N/A) RADICAL DEBULKING (N/A) EXPLORATORY LAPAROTOMY WITH REMOVAL OF BILATERAL TUBES AND OVARIES (N/A)  Subjective: Patient reports flatus, mild incisional pain  Objective: Vital signs in last 24 hours: Temp:  [98 F (36.7 C)-99 F (37.2 C)] 98 F (36.7 C) (08/24 0559) Pulse Rate:  [81-89] 81 (08/24 0559) Resp:  [11-21] 18 (08/24 0559) BP: (91-99)/(50-58) 95/58 (08/24 0559) SpO2:  [97 %-100 %] 97 % (08/24 0559) Last BM Date: 01/27/18  Intake/Output from previous day: 08/23 0701 - 08/24 0700 In: 3323 [P.O.:430; I.V.:1827.4; IV Piggyback:1065.6] Out: 540 [Urine:540]  Physical Examination: General: Somnolent, conversant, disoriented to the day Resp: rales bibasilar and Good air movement Cardio: regular rate and rhythm GI: normoactive BS, softly distended Extremities: extremities normal, atraumatic, no cyanosis or edema and Homans sign is negative, no sign of DVT  Labs: WBC/Hgb/Hct/Plts:  4.4/6.7/22.0/94 (08/24 5277) BUN/Cr/glu/ALT/AST/amyl/lip:  28/0.98/--/--/--/--/-- (08/24 8242)   Assessment:  72 y.o. s/p Procedure(s): OMENTECTOMY RADICAL DEBULKING EXPLORATORY LAPAROTOMY WITH REMOVAL OF BILATERAL TUBES AND OVARIES: stable  Pain:  Pain is well-controlled on oral medications.  Heme: Anemia secondary to blood loss.  Possible component of dilution  Pulm:  O2 sats in range; h/o malignant pleural effusions  CV: Hypotension.  H/O chronic HTN on a beta blocker  GI:  Tolerating po: Yes    FEN: Hypovolemia--elevated BUN, chloride.  Attempts currently to volume resuscitate with IV boluses, increased maintenance fluid  Renal: Borderline oliguria.  Creatinine downward trending  Prophylaxis: pharmacologic prophylaxis (with any of the following: enoxaparin (Lovenox) 40mg  SQ 2 hours prior to surgery then every day) and intermittent pneumatic compression boots.  Plan: >limit narcotics >discontinue beta blocker for  now >CXR >transfuse 1 unit PRBC >change IV-->D5 1/2 NS >OOB/ambulation w/assistance >possible [facility] consult   LOS: 2 days    Lahoma Crocker 01/29/2018, 9:08 AM

## 2018-01-29 NOTE — Progress Notes (Signed)
Pt confused presently; states she is in [place] and going to [employer], seems restless - fidgeting with IV site and foley catheter; trying to get oob. Dr. Delsa Sale notified. Discussed delirium protocol; limiting narcotics as much as possible to help decrease confusion from medication. Will re-evaluate after CXR and labs following blood transfusion completion.

## 2018-01-29 NOTE — Progress Notes (Signed)
Pt. States that she does not want to wear CPAP, only wears sometimes at home.  Family present in room.  Requested patient or family call if decides to wear CPAP device.

## 2018-01-30 ENCOUNTER — Inpatient Hospital Stay (HOSPITAL_COMMUNITY): Payer: Medicare PPO

## 2018-01-30 DIAGNOSIS — Z9189 Other specified personal risk factors, not elsewhere classified: Secondary | ICD-10-CM

## 2018-01-30 DIAGNOSIS — R0603 Acute respiratory distress: Secondary | ICD-10-CM | POA: Diagnosis present

## 2018-01-30 DIAGNOSIS — R34 Anuria and oliguria: Secondary | ICD-10-CM

## 2018-01-30 DIAGNOSIS — R400 Somnolence: Secondary | ICD-10-CM

## 2018-01-30 LAB — CBC
HEMATOCRIT: 29.5 % — AB (ref 36.0–46.0)
HEMOGLOBIN: 9.5 g/dL — AB (ref 12.0–15.0)
MCH: 23.3 pg — AB (ref 26.0–34.0)
MCHC: 32.2 g/dL (ref 30.0–36.0)
MCV: 72.5 fL — AB (ref 78.0–100.0)
Platelets: 100 10*3/uL — ABNORMAL LOW (ref 150–400)
RBC: 4.07 MIL/uL (ref 3.87–5.11)
RDW: 18.2 % — ABNORMAL HIGH (ref 11.5–15.5)
WBC: 2.6 10*3/uL — ABNORMAL LOW (ref 4.0–10.5)

## 2018-01-30 LAB — BPAM RBC
BLOOD PRODUCT EXPIRATION DATE: 201909212359
ISSUE DATE / TIME: 201908241136
Unit Type and Rh: 7300

## 2018-01-30 LAB — BASIC METABOLIC PANEL
Anion gap: 6 (ref 5–15)
BUN: 21 mg/dL (ref 8–23)
CHLORIDE: 111 mmol/L (ref 98–111)
CO2: 22 mmol/L (ref 22–32)
CREATININE: 0.79 mg/dL (ref 0.44–1.00)
Calcium: 8.3 mg/dL — ABNORMAL LOW (ref 8.9–10.3)
GFR calc non Af Amer: >60 mL/min (ref >60)
Glucose, Bld: 82 mg/dL (ref 70–99)
Potassium: 3.8 mmol/L (ref 3.5–5.1)
Sodium: 139 mmol/L (ref 135–145)

## 2018-01-30 LAB — TYPE AND SCREEN
ABO/RH(D): B POS
Antibody Screen: NEGATIVE
UNIT DIVISION: 0

## 2018-01-30 LAB — MRSA PCR SCREENING: MRSA by PCR: NEGATIVE

## 2018-01-30 LAB — TROPONIN I: Troponin I: <0.03 ng/mL (ref <0.03)

## 2018-01-30 LAB — OSMOLALITY, URINE: Osmolality, Ur: 320 mOsm/kg (ref 300–900)

## 2018-01-30 LAB — PREALBUMIN: PREALBUMIN: 6.2 mg/dL — AB (ref 18–38)

## 2018-01-30 MED ORDER — FAMOTIDINE IN NACL 20-0.9 MG/50ML-% IV SOLN
20.0000 mg | Freq: Two times a day (BID) | INTRAVENOUS | Status: DC
  Administered 2018-01-30 – 2018-01-31 (×3): 20 mg via INTRAVENOUS
  Filled 2018-01-30 (×3): qty 50

## 2018-01-30 MED ORDER — CARVEDILOL 12.5 MG PO TABS
12.5000 mg | ORAL_TABLET | Freq: Two times a day (BID) | ORAL | Status: DC
  Administered 2018-01-30: 12.5 mg via ORAL
  Filled 2018-01-30: qty 1

## 2018-01-30 MED ORDER — IPRATROPIUM-ALBUTEROL 0.5-2.5 (3) MG/3ML IN SOLN
3.0000 mL | Freq: Four times a day (QID) | RESPIRATORY_TRACT | Status: DC
  Administered 2018-01-30 (×4): 3 mL via RESPIRATORY_TRACT
  Filled 2018-01-30 (×4): qty 3

## 2018-01-30 MED ORDER — IOPAMIDOL (ISOVUE-370) INJECTION 76%
100.0000 mL | Freq: Once | INTRAVENOUS | Status: AC | PRN
  Administered 2018-01-30: 100 mL via INTRAVENOUS

## 2018-01-30 MED ORDER — POLYETHYLENE GLYCOL 3350 17 G PO PACK: 17.0000 g | PACK | Freq: Every day | ORAL | Status: DC | PRN

## 2018-01-30 MED ORDER — LABETALOL HCL 5 MG/ML IV SOLN
5.0000 mg | Freq: Once | INTRAVENOUS | Status: AC
  Administered 2018-01-30: 5 mg via INTRAVENOUS
  Filled 2018-01-30: qty 4

## 2018-01-30 MED ORDER — LABETALOL HCL 5 MG/ML IV SOLN
10.0000 mg | Freq: Once | INTRAVENOUS | Status: AC
  Administered 2018-01-30: 10 mg via INTRAVENOUS
  Filled 2018-01-30: qty 4

## 2018-01-30 MED ORDER — IOPAMIDOL (ISOVUE-370) INJECTION 76%
INTRAVENOUS | Status: AC
  Administered 2018-01-30: 04:00:00
  Filled 2018-01-30: qty 100

## 2018-01-30 MED ORDER — KCL IN DEXTROSE-NACL 20-5-0.45 MEQ/L-%-% IV SOLN
INTRAVENOUS | Status: DC
  Administered 2018-01-30: 21:00:00 via INTRAVENOUS
  Filled 2018-01-30: qty 1000

## 2018-01-30 MED ORDER — LEVALBUTEROL HCL 1.25 MG/0.5ML IN NEBU: 1.2500 mg | INHALATION_SOLUTION | Freq: Four times a day (QID) | RESPIRATORY_TRACT | Status: DC | PRN

## 2018-01-30 MED ORDER — BISACODYL 10 MG RE SUPP
10.0000 mg | Freq: Once | RECTAL | Status: AC
  Administered 2018-01-30: 10 mg via RECTAL
  Filled 2018-01-30: qty 1

## 2018-01-30 NOTE — Progress Notes (Addendum)
3 Days Post-Op Procedure(s) (LRB): OMENTECTOMY (N/A) RADICAL DEBULKING (N/A) EXPLORATORY LAPAROTOMY WITH REMOVAL OF BILATERAL TUBES AND OVARIES (N/A)  Subjective: The patient's family reports a BM yesterday and difficulty swallowing. The patient reports incisional pain.  Objective: Vital signs in last 24 hours: Temp:  [97.4 F (36.3 C)-98.5 F (36.9 C)] 98.1 F (36.7 C) (08/25 0800) Pulse Rate:  [86-106] 101 (08/25 0608) Resp:  [11-28] 28 (08/25 0608) BP: (92-156)/(50-107) 156/75 (08/25 0608) SpO2:  [95 %-99 %] 98 % (08/25 0919) Last BM Date: 01/29/18  Intake/Output from previous day: 08/24 0701 - 08/25 0700 In: 3753.1 [P.O.:760; I.V.:1986.1; Blood:405; IV [id number]:923] Out: 3007 [Urine:1125]  Physical Examination: General: Somnolent, conversant, disoriented to the day Resp: Good air movement Cardio: regular rate and rhythm GI: normoactive BS, softly distended; I: dressing with dried heme Extremities: extremities normal, atraumatic, no cyanosis or edema and Homans sign is negative, no sign of DVT Neuro: nonfocal  Labs: WBC/Hgb/Hct/Plts:  2.6/9.5/29.5/100 (08/25 6226) BUN/Cr/glu/ALT/AST/amyl/lip:  21/0.79/--/--/--/--/-- (08/25 3335)   Assessment:  72 y.o. s/p Procedure(s): OMENTECTOMY RADICAL DEBULKING EXPLORATORY LAPAROTOMY WITH REMOVAL OF BILATERAL TUBES AND OVARIES: stable  Pain:  Pain is controlled on oral medications.  Neuro: Delirium--likely 2/2 hypovolemia.  Exam stable.  Heme: Anemia secondary to blood loss; hemoglobin trending up s/p transfusion.  Mild thrombocytopenia-HIT less likely.  Platelet count stable    Pulm:  Dyspnea. O2 sats in range on RA.  CTA negative for PE; mild overload changes.  H/O malignant pleural effusions/COPD.  CV: Hypotension resolved/ B/Ps elevated.  H/O chronic HTN on a beta blocker  GI:  Tolerating po: Yes    FEN: Intravascular volume improved after volume resuscitation and mobilization of third space fluid--BUN, chloride in  range.  Lactic acid normal.  Renal: AKI/ prerenal.  aFENA 0.10%.  Urine output improving.  Creatinine downward trending  Prophylaxis: pharmacologic prophylaxis (with any of the following: enoxaparin (Lovenox) 40mg  SQ 2 hours prior to surgery then every day) and intermittent pneumatic compression boots.  Plan:  >Hospitalist notes appreciated >supportive measures for delirium; limit narcotics; OK to add NSAIDs to optimize pain control >may need to resume beta blocker >OOB/ambulation w/assistance >swallow study >montior MS changes >continue respiratory therapy/inhaler/IS >monitor CBC >consider possible PT referral >OK for transfer to floor   LOS: 3 days    Jo Parker 01/30/2018, 11:46 AM

## 2018-01-30 NOTE — Progress Notes (Signed)
Pt to room 1229 in stable condition with RN and CNA at pt bedside. No needs or s/s of distress at time of transfer though pt still remains anxious and short of breath at times. Report given to [place] prior to transfer to [facility].

## 2018-01-30 NOTE — Progress Notes (Signed)
Report given to Terri, rn on [facility] and pt transferred to unit. Arrived in good condition; but had an episode of vomiting upon moving pt from ICU to floor bed. Pt cleaned and made comfortable.  Hale Bogus.

## 2018-01-30 NOTE — Progress Notes (Signed)
MD called regarding pt vomiting, having confusion, high blood pressure (160/94), and high pulse rate (114). Orders given. Will continue to monitor.

## 2018-01-30 NOTE — Progress Notes (Addendum)
Dr. Roel Cluck to room to see pt. Pt found to be anxious and short of breath (this was new from pt baseline assessment by night RN). Pt appeared to also be more confused (stating she was worried about sx, she has already had surgery). Pt was not oriented to place or day. Orthostatic Vs were taken, ekg and lab work performed. Pt given Narcan per md order and pt did more alert and oriented for a short period of time. Pt 02 sat and VS were wnl throughout the entire event. Pt Breathing overall after Narcan was given, though pt did appear anxious again in a short period of time. Pt to be moved to stepdown when bed is available. Family aware of plan of care and transfer.

## 2018-01-30 NOTE — Progress Notes (Signed)
PROGRESS NOTE    Jo Parker  PPJ:[id number] DOB: February 05, 1946 DOA: 01/27/2018 PCP: [name], MD  Brief Narrative48 year old female with history of ovarian cancer with primary peritoneal carcinomatosis, COPD, OSA on CPAP, white coat hypertension syndrome, AAA. HLD, history of uterine cancer status post partial hysterectomy in 27 In March 2019 patient was found to have incidental finding of cirrhosis liver and ascites initially treated with diuretics but then did not improve CT of the scan abdomen and pelvis showed carcinomatosis with peritoneal thickening CA 125 was elevated and thoracentesis was positive for adenocarcinoma.  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  August 22 patient undergone Exploratory laparotomy infra-gastric omentectomy bilateral salpingo-oophorectomy radical optimal tumor debulking, resection of anterior abdominal wallby Dr. Skeet Latch. 500 ml blood loss.  Yesterday (POD2) she was noted to be more lethargic. After receiving Oxy IR she received Narcan and seemed to be more alert since then. Assessment & Plan:   Active Problems:   Severe protein-calorie malnutrition ([name])   COPD (chronic obstructive pulmonary disease) ([name])   HLD (hyperlipidemia)   Absolute anemia   White coat syndrome with hypertension   Ovarian cancer ([place])   AKI (acute kidney injury) ([place])   Acute metabolic encephalopathy   Acute  respiratory distress  1] acute metabolic encephalopathy seems to be improving.  Minimize narcotics.  She was awake talking and answered all my questions appropriately today.  No evidence of source of infection identified.  I would watch her closely avoid anything that worsens her mental status.  Family by the bedside.  Consider CT of head tomorrow if symptoms worsen.  2] COPD patient has 50-year history of smoking.  She reports the nebulizer treatments have helped her breathing.  CT angiogram of the chest showed no evidence of PE.  I did not see a recent echo in the computer.  I will order an echocardiogram to see if there is any evidence of pericardial effusion with a history of malignant ovarian CA.  3] transient postop AKI resolved with IV fluids.  I will stop the IV fluids today.  As I feel she already has some fluid retention in the pelvis which could be exacerbated with IV hydration.  Nursing reports her p.o. intake is not very adequate.  Will monitor closely.  4] history of diastolic CHF check echocardiogram tomorrow.  5] ovarian cancer status post debulking of the tumor per primary team.  6] postop anemia status post blood transfusion monitor.  7] constipation family reports she had a bowel movement yesterday.  8] thrombocytopenia improved.  Monitor on Lovenox.    DVT prophylaxis: Lovenox Code Status: Full code Family Communication: Discussed with family who was in the room 1 with her son and son's wife. Disposition Plan: TBD Subjective: Patient is awake answer questions appropriately she complains of shortness of breath with any exertion.   Objective: Vitals:   01/30/18 0605 01/30/18 0608 01/30/18 0800 01/30/18 0919  BP: (!) 149/62 (!) 156/75    Pulse: 99 (!) 101    Resp: (!) 22 (!) 28  Temp:   98.1 F (36.7 C)   TempSrc:   Oral   SpO2: 99% 95% 97% 98%  Weight:      Height:        Intake/Output Summary (Last 24 hours) at 01/30/2018 1124 Last data filed at 01/30/2018  0341 Gross per 24 hour  Intake 3259.54 ml  Output 975 ml  Net 2284.54 ml   Filed Weights   01/27/18 0652  Weight: 52.6 kg    Examination:  General exam: Appears calm and comfortable  Respiratory system: Decreased breath sounds at the bases auscultation. Respiratory effort normal. Cardiovascular system: S1 & S2 heard, RRR. No JVD, murmurs, rubs, gallops or clicks. No pedal edema. Gastrointestinal system: Abdomen is distended, soft and tender. No organomegaly or masses felt. Normal bowel sounds heard. Central nervous system: Alert and awake answers all my questions appropriately. Extremities: Symmetric 5 x 5 power. Skin: No rashes, lesions or ulcers     Data Reviewed: I have personally reviewed following labs and imaging studies  CBC: Recent Labs  Lab 01/26/18 1457  01/28/18 0405 01/28/18 1152 01/28/18 1523 01/29/18 0520 01/29/18 2113 01/30/18 0721  WBC 4.1  --  4.5  --  6.9 4.4 3.5* 2.6*  NEUTROABS 1.5  --   --   --   --   --  2.6  --   HGB 10.5*   < > 8.4* 8.0* 7.9* 6.7* 8.6* 9.5*  HCT 33.6*   < > 26.3* 25.6* 25.0* 22.0* 27.1* 29.5*  MCV 69.6*  --  70.5*  --  71.0* 72.1* 73.4* 72.5*  PLT 151  --  114*  --  108* 94* 88* 100*   < > = values in this interval not displayed.   Basic Metabolic Panel: Recent Labs  Lab 01/28/18 0405 01/28/18 1523 01/29/18 0520 01/29/18 1701 01/29/18 2113 01/30/18 0721  NA 138 137 139 140  --  139  K 4.2 4.7 4.3 4.1  --  3.8  CL 108 107 113* 112*  --  111  CO2 23 26 22  21*  --  22  GLUCOSE 176* 134* 106* 117*  --  82  BUN 19 28* 28* 28*  --  21  CREATININE 0.63 1.25* 0.98 1.14*  --  0.79  CALCIUM 8.4* 8.1* 7.9* 8.3*  --  8.3*  MG  --   --   --   --  2.0  --   PHOS  --   --   --   --  2.9  --    GFR: Estimated Creatinine Clearance: 52.6 mL/min (by C-G formula based on SCr of 0.79 mg/dL). Liver Function Tests: Recent Labs  Lab 01/26/18 1457 01/29/18 1701  AST 23 32  ALT 20 20  ALKPHOS 115 64  BILITOT 0.4 1.2  PROT 7.4  5.7*  ALBUMIN 4.0 3.0*   No results for input(s): LIPASE, AMYLASE in the last 168 hours. Recent Labs  Lab 01/29/18 2130  AMMONIA 18   Coagulation Profile: No results for input(s): INR, PROTIME in the last 168 hours. Cardiac Enzymes: Recent Labs  Lab 01/29/18 2113 01/30/18 0347  CKTOTAL 197  --   TROPONINI <0.03 <0.03   BNP (last 3 results) No results for input(s): PROBNP in the last 8760 hours. HbA1C: No results for input(s): HGBA1C in the last 72 hours. CBG: No results for input(s): GLUCAP in the last 168 hours. Lipid Profile: No results for input(s): CHOL, HDL, LDLCALC, TRIG, CHOLHDL, LDLDIRECT in the last 72 hours. Thyroid Function  Tests: No results for input(s): TSH, T4TOTAL, FREET4, T3FREE, THYROIDAB in the last 72 hours. Anemia Panel: No results for input(s): VITAMINB12, FOLATE, FERRITIN, TIBC, IRON, RETICCTPCT in the last 72 hours. Sepsis Labs: Recent Labs  Lab 01/29/18 2130  LATICACIDVEN 1.4    Recent Results (from the past 240 hour(s))  MRSA PCR Screening     Status: None   Collection Time: 01/30/18 12:17 AM  Result Value Ref Range Status   MRSA by PCR NEGATIVE NEGATIVE Final    Comment:        The GeneXpert MRSA Assay (FDA approved for NASAL specimens only), is one component of a comprehensive MRSA colonization surveillance program. It is not intended to diagnose MRSA infection nor to guide or monitor treatment for MRSA infections. Performed at [facility], [place] [address], [place], [place] [address]          Radiology Studies: Dg Chest 2 View  Result Date: 01/29/2018 CLINICAL DATA:  Hypertension, COPD. At risk for fluid overload. Recent laparotomy. EXAM: CHEST - 2 VIEW COMPARISON:  01/21/2018 FINDINGS: There is free air under the hemidiaphragms compatible with history of recent laparotomy. Right Port-A-Cath remains in place, unchanged as does the loop recorder device. Cardiomegaly with vascular congestion. No overt edema.  Bibasilar atelectasis. IMPRESSION: Cardiomegaly, vascular congestion.  Bibasilar atelectasis. Pneumoperitoneum, likely related to recent laparotomy. Electronically Signed   By: Rolm Baptise M.D.   On: 01/29/2018 17:30   Ct Angio Chest Pe W Or Wo Contrast  Result Date: 01/30/2018 CLINICAL DATA:  PE suspected, high pretest prob. Tachycardia, hypotension, shortness of breath. Recent abdominal surgery. EXAM: CT ANGIOGRAPHY CHEST WITH CONTRAST TECHNIQUE: Multidetector CT imaging of the chest was performed using the standard protocol during bolus administration of intravenous contrast. Multiplanar CT image reconstructions and MIPs were obtained to evaluate the vascular anatomy. CONTRAST:  119mL ISOVUE-370 IOPAMIDOL (ISOVUE-370) INJECTION 76% COMPARISON:  Radiographs yesterday.  Chest CT 10/23/2017 FINDINGS: Cardiovascular: There are no filling defects within the pulmonary arteries to the segmental level to suggest pulmonary embolus. Subsegmental branches cannot be assessed. Thoracic aorta is tortuous with atherosclerosis. 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 No periaortic stranding. No progression or change from prior exam. Mild multi chamber cardiomegaly. There are coronary artery calcifications. Right chest port with tip in the atrial caval junction. Mediastinum/Nodes: No enlarged mediastinal or hilar lymph nodes. No axillary adenopathy. Normal thyroid gland. Distal esophagus is mildly patulous. Lungs/Pleura: Small bilateral pleural effusions with adjacent compressive atelectasis in the lower lobes, pleural effusions have diminished from prior CT. Paramediastinal ground-glass opacity in the left upper lobe adjacent to the hilum as well as peripheral ground-glass opacity in the left upper lobe image 42 series 6, are new from prior CT. Mild atelectasis in the right middle lobe. Mild emphysema. Trachea and mainstem bronchi are patent. No pulmonary mass or evident nodule. Upper Abdomen:  Free air likely sequela of recent laparotomy. There is upper abdominal free fluid. Hyperdensity in the stomach likely ingested material. Musculoskeletal: There are no acute or suspicious osseous abnormalities. Implanted loop recorder in the left chest wall. Review of the MIP images confirms the above findings. IMPRESSION: 1. No pulmonary embolus. 2. Small bilateral pleural effusions and adjacent compressive atelectasis. Degree of pleural fluid has decreased from prior CT. 3. If you left upper lobe ground-glass opacities are nonspecific, may be infectious or inflammatory. This does not have the appearance of pulmonary edema. 4. Aortic Atherosclerosis (ICD10-I70.0) and Emphysema (ICD10-J43.9). 5. 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 No adjacent stranding to suggest inflammation. 6. Free air in the upper abdomen related to recent laparotomy. There is free fluid as well. Electronically Signed   By: Jeb Levering M.D.   On: 01/30/2018 03:51   Dg Chest Port 1 View  Result Date: 01/29/2018 CLINICAL DATA:  Dyspnea.  Laparotomy 2 days ago. EXAM: PORTABLE CHEST 1 VIEW COMPARISON:  Chest radiographs earlier this day. FINDINGS: Tip of the right chest port in the mid SVC. Loop recorder projects over the left chest wall. Low lung volumes with bibasilar opacities, left greater than right. Possible left pleural effusion. Slight improvement in cardiomegaly. Persistent but improving vascular congestion. No pneumothorax. Again seen pneumoperitoneum, likely related to recent laparotomy. IMPRESSION: 1. Improving vascular congestion. Slight improvement in cardiomegaly may be related to improved aeration. 2. Bibasilar opacities, left greater than right, likely atelectasis. Possible small left pleural effusion. 3. Pneumoperitoneum again seen, likely related to recent laparotomy. Electronically Signed   By: Jeb Levering M.D.   On: 01/29/2018 22:41         Scheduled Meds: . acetaminophen  1,000 mg Oral Q6H  . aspirin EC  81 mg Oral Daily  . chewing gum (ORBIT) sugar free  1 Stick Oral TID PC  . citalopram  20 mg Oral Daily  . enoxaparin (LOVENOX) injection  40 mg Subcutaneous Q24H  . ipratropium-albuterol  3 mL Nebulization Q6H  . lactose free nutrition  237 mL Oral TID WC  . mouth rinse  15 mL Mouth Rinse BID  . mometasone-formoterol  2 puff Inhalation BID  . senna-docusate  2 tablet Oral QHS   Continuous Infusions:   LOS: 3 days      Georgette Shell, MD [facility]  If 7PM-7AM, please contact night-coverage [email or web address] Password [facility] 01/30/2018, 11:24 AM

## 2018-01-31 ENCOUNTER — Inpatient Hospital Stay (HOSPITAL_COMMUNITY): Payer: Medicare PPO

## 2018-01-31 ENCOUNTER — Inpatient Hospital Stay (HOSPITAL_COMMUNITY): Payer: Medicare PPO | Admitting: Certified Registered Nurse Anesthetist

## 2018-01-31 ENCOUNTER — Encounter (HOSPITAL_COMMUNITY)
Admission: RE | Disposition: A | Discharge status: Nursing Home (Includes ICF) | Payer: Self-pay | Source: Home / Self Care | Attending: Gynecologic Oncology

## 2018-01-31 ENCOUNTER — Other Ambulatory Visit: Payer: Self-pay

## 2018-01-31 ENCOUNTER — Encounter (HOSPITAL_COMMUNITY): Payer: Self-pay

## 2018-01-31 ENCOUNTER — Telehealth: Payer: Self-pay | Admitting: Hematology and Oncology

## 2018-01-31 ENCOUNTER — Inpatient Hospital Stay: Payer: Self-pay

## 2018-01-31 DIAGNOSIS — R0603 Acute respiratory distress: Secondary | ICD-10-CM

## 2018-01-31 DIAGNOSIS — K668 Other specified disorders of peritoneum: Secondary | ICD-10-CM

## 2018-01-31 DIAGNOSIS — I5030 Unspecified diastolic (congestive) heart failure: Secondary | ICD-10-CM

## 2018-01-31 HISTORY — PX: LAPAROTOMY: SHX154

## 2018-01-31 LAB — CBC
HCT: 32.2 % — ABNORMAL LOW (ref 36.0–46.0)
HCT: 34.5 % — ABNORMAL LOW (ref 36.0–46.0)
HEMATOCRIT: 30.3 % — AB (ref 36.0–46.0)
Hemoglobin: 10.8 g/dL — ABNORMAL LOW (ref 12.0–15.0)
Hemoglobin: 10.1 g/dL — ABNORMAL LOW (ref 12.0–15.0)
Hemoglobin: 11.4 g/dL — ABNORMAL LOW (ref 12.0–15.0)
MCH: 23.7 pg — AB (ref 26.0–34.0)
MCH: 23.7 pg — ABNORMAL LOW (ref 26.0–34.0)
MCH: 24.1 pg — AB (ref 26.0–34.0)
MCHC: 33.5 g/dL (ref 30.0–36.0)
MCHC: 33.3 g/dL (ref 30.0–36.0)
MCHC: 33 g/dL (ref 30.0–36.0)
MCV: 70.8 fL — ABNORMAL LOW (ref 78.0–100.0)
MCV: 71.1 fL — AB (ref 78.0–100.0)
MCV: 72.9 fL — ABNORMAL LOW (ref 78.0–100.0)
PLATELETS: 120 10*3/uL — AB (ref 150–400)
PLATELETS: 85 10*3/uL — AB (ref 150–400)
Platelets: 142 10*3/uL — ABNORMAL LOW (ref 150–400)
RBC: 4.55 MIL/uL (ref 3.87–5.11)
RBC: 4.26 MIL/uL (ref 3.87–5.11)
RBC: 4.73 MIL/uL (ref 3.87–5.11)
RDW: 18.2 % — AB (ref 11.5–15.5)
RDW: 18.2 % — ABNORMAL HIGH (ref 11.5–15.5)
RDW: 18.9 % — ABNORMAL HIGH (ref 11.5–15.5)
WBC: 3.1 10*3/uL — ABNORMAL LOW (ref 4.0–10.5)
WBC: 3.7 10*3/uL — ABNORMAL LOW (ref 4.0–10.5)
WBC: 1.4 10*3/uL — CL (ref 4.0–10.5)

## 2018-01-31 LAB — BLOOD GAS, ARTERIAL
Acid-base deficit: 4.3 mmol/L — ABNORMAL HIGH (ref 0.0–2.0)
Acid-base deficit: 11.1 mmol/L — ABNORMAL HIGH (ref 0.0–2.0)
Acid-base deficit: 4.9 mmol/L — ABNORMAL HIGH (ref 0.0–2.0)
Bicarbonate: 17.6 mmol/L — ABNORMAL LOW (ref 20.0–28.0)
Bicarbonate: 17.1 mmol/L — ABNORMAL LOW (ref 20.0–28.0)
Bicarbonate: 22.2 mmol/L (ref 20.0–28.0)
DRAWN BY: 295031
DRAWN BY: 422461
Drawn by: 225631
FIO2: 21
FIO2: 100
FIO2: 100
LHR: 24 {breaths}/min
MECHVT: 330 mL
O2 SAT: 91.4 %
O2 SAT: 87.1 %
O2 Saturation: 83.8 %
PATIENT TEMPERATURE: 98.5
PCO2 ART: 23.6 mmHg — AB (ref 32.0–48.0)
PCO2 ART: 53.4 mmHg — AB (ref 32.0–48.0)
PEEP: 12 cmH2O
PEEP: 12 cmH2O
PH ART: 7.242 — AB (ref 7.350–7.450)
Patient temperature: 37
Patient temperature: 98.5
RATE: 33 resp/min
VT: 330 mL
pCO2 arterial: 50 mmHg — ABNORMAL HIGH (ref 32.0–48.0)
pH, Arterial: 7.485 — ABNORMAL HIGH (ref 7.350–7.450)
pH, Arterial: 7.159 — CL (ref 7.350–7.450)
pO2, Arterial: 57.8 mmHg — ABNORMAL LOW (ref 83.0–108.0)
pO2, Arterial: 63 mmHg — ABNORMAL LOW (ref 83.0–108.0)
pO2, Arterial: 62 mmHg — ABNORMAL LOW (ref 83.0–108.0)

## 2018-01-31 LAB — BASIC METABOLIC PANEL
Anion gap: 10 (ref 5–15)
Anion gap: 8 (ref 5–15)
BUN: 17 mg/dL (ref 8–23)
BUN: 18 mg/dL (ref 8–23)
CALCIUM: 9 mg/dL (ref 8.9–10.3)
CO2: 21 mmol/L — ABNORMAL LOW (ref 22–32)
CO2: 20 mmol/L — ABNORMAL LOW (ref 22–32)
CREATININE: 0.6 mg/dL (ref 0.44–1.00)
CREATININE: 0.63 mg/dL (ref 0.44–1.00)
Calcium: 7.8 mg/dL — ABNORMAL LOW (ref 8.9–10.3)
Chloride: 108 mmol/L (ref 98–111)
Chloride: 115 mmol/L — ABNORMAL HIGH (ref 98–111)
GFR calc Af Amer: >60 mL/min (ref >60)
GLUCOSE: 196 mg/dL — AB (ref 70–99)
Glucose, Bld: 112 mg/dL — ABNORMAL HIGH (ref 70–99)
POTASSIUM: 3.8 mmol/L (ref 3.5–5.1)
Potassium: 3.9 mmol/L (ref 3.5–5.1)
SODIUM: 143 mmol/L (ref 135–145)
Sodium: 139 mmol/L (ref 135–145)

## 2018-01-31 LAB — POCT I-STAT 7, (LYTES, BLD GAS, ICA,H+H)
ACID-BASE DEFICIT: 9 mmol/L — AB (ref 0.0–2.0)
Acid-base deficit: 11 mmol/L — ABNORMAL HIGH (ref 0.0–2.0)
Bicarbonate: 15.9 mmol/L — ABNORMAL LOW (ref 20.0–28.0)
Bicarbonate: 18.7 mmol/L — ABNORMAL LOW (ref 20.0–28.0)
CALCIUM ION: 1.05 mmol/L — AB (ref 1.15–1.40)
CALCIUM ION: 1.16 mmol/L (ref 1.15–1.40)
HCT: 24 % — ABNORMAL LOW (ref 36.0–46.0)
HEMATOCRIT: 26 % — AB (ref 36.0–46.0)
Hemoglobin: 8.2 g/dL — ABNORMAL LOW (ref 12.0–15.0)
Hemoglobin: 8.8 g/dL — ABNORMAL LOW (ref 12.0–15.0)
O2 Saturation: 100 %
O2 Saturation: 99 %
PCO2 ART: 38.8 mmHg (ref 32.0–48.0)
PH ART: 7.211 — AB (ref 7.350–7.450)
PO2 ART: 200 mmHg — AB (ref 83.0–108.0)
POTASSIUM: 3.6 mmol/L (ref 3.5–5.1)
Potassium: 3.7 mmol/L (ref 3.5–5.1)
Sodium: 143 mmol/L (ref 135–145)
Sodium: 145 mmol/L (ref 135–145)
TCO2: 20 mmol/L — AB (ref 22–32)
TCO2: 17 mmol/L — ABNORMAL LOW (ref 22–32)
pCO2 arterial: 46.5 mmHg (ref 32.0–48.0)
pH, Arterial: 7.219 — ABNORMAL LOW (ref 7.350–7.450)
pO2, Arterial: 228 mmHg — ABNORMAL HIGH (ref 83.0–108.0)

## 2018-01-31 LAB — TROPONIN I
Troponin I: <0.03 ng/mL (ref <0.03)
Troponin I: <0.03 ng/mL (ref <0.03)
Troponin I: 0.03 ng/mL (ref <0.03)

## 2018-01-31 LAB — PROTIME-INR
INR: 1.36
Prothrombin Time: 16.6 seconds — ABNORMAL HIGH (ref 11.4–15.2)

## 2018-01-31 LAB — GLUCOSE, CAPILLARY: GLUCOSE-CAPILLARY: 78 mg/dL (ref 70–99)

## 2018-01-31 LAB — ECHOCARDIOGRAM COMPLETE
HEIGHTINCHES: 63 in
WEIGHTICAEL: 2056.45 [oz_av]

## 2018-01-31 LAB — URINE CULTURE
Culture: NO GROWTH
SPECIAL REQUESTS: NORMAL

## 2018-01-31 LAB — APTT: aPTT: 36 seconds (ref 24–36)

## 2018-01-31 LAB — LACTIC ACID, PLASMA: Lactic Acid, Venous: 2.7 mmol/L (ref 0.5–1.9)

## 2018-01-31 LAB — TRIGLYCERIDES: Triglycerides: 117 mg/dL (ref <150)

## 2018-01-31 LAB — PREPARE RBC (CROSSMATCH)

## 2018-01-31 SURGERY — LAPAROTOMY, EXPLORATORY
Anesthesia: General

## 2018-01-31 MED ORDER — HEPARIN SODIUM (PORCINE) 5000 UNIT/ML IJ SOLN
5000.0000 [IU] | Freq: Three times a day (TID) | INTRAMUSCULAR | Status: DC
  Administered 2018-02-01: 5000 [IU] via SUBCUTANEOUS
  Filled 2018-01-31: qty 1

## 2018-01-31 MED ORDER — PROPOFOL 500 MG/50ML IV EMUL
INTRAVENOUS | Status: DC | PRN
  Administered 2018-01-31: 25 ug/kg/min via INTRAVENOUS

## 2018-01-31 MED ORDER — MIDAZOLAM HCL 2 MG/2ML IJ SOLN
INTRAMUSCULAR | Status: AC
  Filled 2018-01-31: qty 2

## 2018-01-31 MED ORDER — IOHEXOL 300 MG/ML  SOLN
100.0000 mL | Freq: Once | INTRAMUSCULAR | Status: AC | PRN
  Administered 2018-01-31: 100 mL via INTRAVENOUS

## 2018-01-31 MED ORDER — ORAL CARE MOUTH RINSE
15.0000 mL | Freq: Two times a day (BID) | OROMUCOSAL | Status: DC
  Administered 2018-02-01: 15 mL via OROMUCOSAL

## 2018-01-31 MED ORDER — LIDOCAINE 2% (20 MG/ML) 5 ML SYRINGE
INTRAMUSCULAR | Status: DC | PRN
  Administered 2018-01-31: 100 mg via INTRAVENOUS

## 2018-01-31 MED ORDER — LACTATED RINGERS IV SOLN
INTRAVENOUS | Status: DC
  Administered 2018-01-31: 16:00:00 via INTRAVENOUS

## 2018-01-31 MED ORDER — CHLORHEXIDINE GLUCONATE 0.12% ORAL RINSE (MEDLINE KIT)
15.0000 mL | Freq: Two times a day (BID) | OROMUCOSAL | Status: DC
  Administered 2018-01-31 – 2018-02-01 (×3): 15 mL via OROMUCOSAL

## 2018-01-31 MED ORDER — MIDAZOLAM HCL 2 MG/2ML IJ SOLN
INTRAMUSCULAR | Status: DC | PRN
  Administered 2018-01-31: 1 mg via INTRAVENOUS
  Administered 2018-01-31 (×2): 0.5 mg via INTRAVENOUS

## 2018-01-31 MED ORDER — SODIUM CHLORIDE 0.9 % IV SOLN
1.0000 g | INTRAVENOUS | Status: AC
  Administered 2018-01-31: 1 g via INTRAVENOUS
  Filled 2018-01-31: qty 1

## 2018-01-31 MED ORDER — PROPOFOL 10 MG/ML IV BOLUS
INTRAVENOUS | Status: AC
  Filled 2018-01-31: qty 20

## 2018-01-31 MED ORDER — SODIUM CHLORIDE 0.9 % IV SOLN
INTRAVENOUS | Status: DC
  Administered 2018-01-31 (×2): via INTRAVENOUS

## 2018-01-31 MED ORDER — 0.9 % SODIUM CHLORIDE (POUR BTL) OPTIME
TOPICAL | Status: DC | PRN
  Administered 2018-01-31: 27000 mL

## 2018-01-31 MED ORDER — FENTANYL CITRATE (PF) 250 MCG/5ML IJ SOLN
INTRAMUSCULAR | Status: DC | PRN
  Administered 2018-01-31 (×2): 25 ug via INTRAVENOUS

## 2018-01-31 MED ORDER — LABETALOL HCL 5 MG/ML IV SOLN
10.0000 mg | Freq: Once | INTRAVENOUS | Status: AC
  Administered 2018-01-31: 10 mg via INTRAVENOUS
  Filled 2018-01-31: qty 4

## 2018-01-31 MED ORDER — LIDOCAINE 2% (20 MG/ML) 5 ML SYRINGE
INTRAMUSCULAR | Status: AC
  Filled 2018-01-31: qty 10

## 2018-01-31 MED ORDER — PROPOFOL 1000 MG/100ML IV EMUL
5.0000 ug/kg/min | INTRAVENOUS | Status: DC
  Administered 2018-01-31 – 2018-02-01 (×2): 25 ug/kg/min via INTRAVENOUS
  Filled 2018-01-31 (×2): qty 100

## 2018-01-31 MED ORDER — CLONIDINE HCL 0.2 MG PO TABS
0.2000 mg | ORAL_TABLET | Freq: Once | ORAL | Status: AC | PRN
  Administered 2018-01-31: 0.2 mg via ORAL
  Filled 2018-01-31: qty 1

## 2018-01-31 MED ORDER — IPRATROPIUM-ALBUTEROL 0.5-2.5 (3) MG/3ML IN SOLN
3.0000 mL | Freq: Three times a day (TID) | RESPIRATORY_TRACT | Status: DC
  Administered 2018-01-31 – 2018-03-04 (×96): 3 mL via RESPIRATORY_TRACT
  Filled 2018-01-31 (×97): qty 3

## 2018-01-31 MED ORDER — ENALAPRILAT 1.25 MG/ML IV SOLN
1.2500 mg | Freq: Once | INTRAVENOUS | Status: AC
  Administered 2018-01-31: 1.25 mg via INTRAVENOUS
  Filled 2018-01-31: qty 1

## 2018-01-31 MED ORDER — SODIUM CHLORIDE 0.9 % IV SOLN
INTRAVENOUS | Status: DC | PRN
  Administered 2018-01-31: 16:00:00 via INTRAVENOUS

## 2018-01-31 MED ORDER — SODIUM BICARBONATE 8.4 % IV SOLN
100.0000 meq | Freq: Once | INTRAVENOUS | Status: AC
  Administered 2018-01-31: 100 meq via INTRAVENOUS
  Filled 2018-01-31: qty 50

## 2018-01-31 MED ORDER — FUROSEMIDE 10 MG/ML IJ SOLN: 40.0000 mg | Freq: Once | INTRAMUSCULAR | Status: DC

## 2018-01-31 MED ORDER — SUCCINYLCHOLINE CHLORIDE 200 MG/10ML IV SOSY
PREFILLED_SYRINGE | INTRAVENOUS | Status: DC | PRN
  Administered 2018-01-31: 100 mg via INTRAVENOUS

## 2018-01-31 MED ORDER — ALBUMIN HUMAN 5 % IV SOLN
INTRAVENOUS | Status: DC | PRN
  Administered 2018-01-31: 16:00:00 via INTRAVENOUS

## 2018-01-31 MED ORDER — IOPAMIDOL (ISOVUE-300) INJECTION 61%: 100.0000 mL | Freq: Once | INTRAVENOUS | Status: DC | PRN

## 2018-01-31 MED ORDER — LACTATED RINGERS IV SOLN
INTRAVENOUS | Status: DC | PRN
  Administered 2018-01-31 (×2): via INTRAVENOUS

## 2018-01-31 MED ORDER — FENTANYL CITRATE (PF) 250 MCG/5ML IJ SOLN
INTRAMUSCULAR | Status: AC
  Filled 2018-01-31: qty 5

## 2018-01-31 MED ORDER — PHENYLEPHRINE HCL-NACL 10-0.9 MG/250ML-% IV SOLN
INTRAVENOUS | Status: AC
  Filled 2018-01-31: qty 250

## 2018-01-31 MED ORDER — FENTANYL CITRATE (PF) 100 MCG/2ML IJ SOLN: 25.0000 ug | INTRAMUSCULAR | Status: DC | PRN

## 2018-01-31 MED ORDER — ALBUMIN HUMAN 5 % IV SOLN
INTRAVENOUS | Status: AC
  Filled 2018-01-31: qty 250

## 2018-01-31 MED ORDER — IBUPROFEN 200 MG PO TABS: 600.0000 mg | ORAL_TABLET | Freq: Four times a day (QID) | ORAL | Status: DC | PRN

## 2018-01-31 MED ORDER — PHENYLEPHRINE HCL-NACL 10-0.9 MG/250ML-% IV SOLN
0.0000 ug/min | INTRAVENOUS | Status: DC
  Administered 2018-01-31: 20 ug/min via INTRAVENOUS
  Administered 2018-02-01: 100 ug/min via INTRAVENOUS
  Administered 2018-02-01: 70 ug/min via INTRAVENOUS
  Administered 2018-02-01: 60 ug/min via INTRAVENOUS
  Filled 2018-01-31 (×5): qty 250

## 2018-01-31 MED ORDER — ASPIRIN 81 MG PO CHEW
81.0000 mg | CHEWABLE_TABLET | Freq: Every day | ORAL | Status: DC
  Administered 2018-01-31: 81 mg via ORAL
  Filled 2018-01-31: qty 1

## 2018-01-31 MED ORDER — DEXAMETHASONE SODIUM PHOSPHATE 10 MG/ML IJ SOLN
INTRAMUSCULAR | Status: AC
  Filled 2018-01-31: qty 1

## 2018-01-31 MED ORDER — CHLORHEXIDINE GLUCONATE 0.12 % MT SOLN
15.0000 mL | Freq: Two times a day (BID) | OROMUCOSAL | Status: DC
  Administered 2018-01-31 – 2018-02-01 (×3): 15 mL via OROMUCOSAL
  Filled 2018-01-31 (×3): qty 15

## 2018-01-31 MED ORDER — NICARDIPINE HCL IN NACL 20-0.86 MG/200ML-% IV SOLN
3.0000 mg/h | INTRAVENOUS | Status: DC
  Administered 2018-01-31 (×3): 5 mg/h via INTRAVENOUS
  Filled 2018-01-31 (×4): qty 200

## 2018-01-31 MED ORDER — STERILE WATER FOR INJECTION IV SOLN
INTRAVENOUS | Status: DC
  Administered 2018-01-31 – 2018-02-01 (×2): via INTRAVENOUS
  Filled 2018-01-31 (×2): qty 850

## 2018-01-31 MED ORDER — SODIUM CHLORIDE 0.9 % IV SOLN
INTRAVENOUS | Status: DC | PRN
  Administered 2018-01-31: 25 ug/min via INTRAVENOUS

## 2018-01-31 MED ORDER — ALBUMIN HUMAN 5 % IV SOLN
INTRAVENOUS | Status: AC
  Filled 2018-01-31: qty 500

## 2018-01-31 MED ORDER — FUROSEMIDE 10 MG/ML IJ SOLN
40.0000 mg | Freq: Once | INTRAMUSCULAR | Status: AC
  Administered 2018-01-31: 40 mg via INTRAVENOUS

## 2018-01-31 MED ORDER — SODIUM CHLORIDE 0.9% IV SOLUTION: Freq: Once | INTRAVENOUS | Status: DC

## 2018-01-31 MED ORDER — FUROSEMIDE 10 MG/ML IJ SOLN
INTRAMUSCULAR | Status: AC
  Filled 2018-01-31: qty 4

## 2018-01-31 MED ORDER — LIDOCAINE HCL URETHRAL/MUCOSAL 2 % EX GEL
1.0000 "application " | Freq: Once | CUTANEOUS | Status: AC
  Administered 2018-01-31: 1 via TOPICAL
  Filled 2018-01-31: qty 5

## 2018-01-31 MED ORDER — ONDANSETRON HCL 4 MG/2ML IJ SOLN
INTRAMUSCULAR | Status: AC
  Filled 2018-01-31: qty 2

## 2018-01-31 MED ORDER — PROPOFOL 10 MG/ML IV BOLUS
INTRAVENOUS | Status: DC | PRN
  Administered 2018-01-31: 70 mg via INTRAVENOUS

## 2018-01-31 MED ORDER — ONDANSETRON HCL 4 MG/2ML IJ SOLN: 4.0000 mg | Freq: Once | INTRAMUSCULAR | Status: DC | PRN

## 2018-01-31 MED ORDER — FENTANYL CITRATE (PF) 100 MCG/2ML IJ SOLN
12.5000 ug | INTRAMUSCULAR | Status: DC | PRN
  Administered 2018-01-31: 12.5 ug via INTRAVENOUS
  Filled 2018-01-31: qty 2

## 2018-01-31 MED ORDER — SODIUM CHLORIDE 0.9 % IV SOLN
1.0000 g | Freq: Three times a day (TID) | INTRAVENOUS | Status: DC
  Administered 2018-01-31 – 2018-02-16 (×48): 1 g via INTRAVENOUS
  Filled 2018-01-31 (×50): qty 1

## 2018-01-31 MED ORDER — ROCURONIUM BROMIDE 10 MG/ML (PF) SYRINGE
PREFILLED_SYRINGE | INTRAVENOUS | Status: DC | PRN
  Administered 2018-01-31: 60 mg via INTRAVENOUS

## 2018-01-31 MED ORDER — CALCIUM CHLORIDE 10 % IV SOLN
INTRAVENOUS | Status: DC | PRN
  Administered 2018-01-31: 100 mg via INTRAVENOUS
  Administered 2018-01-31: 200 mg via INTRAVENOUS

## 2018-01-31 MED ORDER — SODIUM CHLORIDE 0.9 % IV BOLUS
500.0000 mL | Freq: Once | INTRAVENOUS | Status: AC
  Administered 2018-01-31: 500 mL via INTRAVENOUS

## 2018-01-31 MED ORDER — SODIUM CHLORIDE 0.9 % IV BOLUS
750.0000 mL | Freq: Once | INTRAVENOUS | Status: AC
  Administered 2018-01-31: 750 mL via INTRAVENOUS

## 2018-01-31 SURGICAL SUPPLY — 56 items
ATTRACTOMAT 16X20 MAGNETIC DRP (DRAPES) ×3 IMPLANT
BLADE EXTENDED COATED 6.5IN (ELECTRODE) IMPLANT
CHLORAPREP W/TINT 26ML (MISCELLANEOUS) ×3 IMPLANT
CLIP VESOCCLUDE LG 6/CT (CLIP) IMPLANT
CLIP VESOCCLUDE MED 6/CT (CLIP) IMPLANT
CONT SPEC 4OZ CLIKSEAL STRL BL (MISCELLANEOUS) IMPLANT
DRAIN CHANNEL RND F F (WOUND CARE) ×9 IMPLANT
DRAPE INCISE IOBAN 66X45 STRL (DRAPES) IMPLANT
DRAPE UNDERBUTTOCKS STRL (DRAPE) IMPLANT
DRAPE WARM FLUID 44X44 (DRAPE) ×3 IMPLANT
DRSG OPSITE POSTOP 4X12 (GAUZE/BANDAGES/DRESSINGS) ×3 IMPLANT
DRSG TEGADERM 4X4.75 (GAUZE/BANDAGES/DRESSINGS) ×3 IMPLANT
ELECT REM PT RETURN 15FT ADLT (MISCELLANEOUS) ×3 IMPLANT
EVACUATOR SILICONE 100CC (DRAIN) ×9 IMPLANT
GAUZE 4X4 16PLY RFD (DISPOSABLE) IMPLANT
GAUZE SPONGE 4X4 12PLY STRL (GAUZE/BANDAGES/DRESSINGS) ×3 IMPLANT
GLOVE BIOGEL PI IND STRL 7.0 (GLOVE) ×2 IMPLANT
GLOVE BIOGEL PI INDICATOR 7.0 (GLOVE) ×4
GLOVE SURG SS PI 6.5 STRL IVOR (GLOVE) ×6 IMPLANT
GOWN STRL REUS W/ TWL LRG LVL3 (GOWN DISPOSABLE) ×3 IMPLANT
GOWN STRL REUS W/TWL LRG LVL3 (GOWN DISPOSABLE) ×15 IMPLANT
HANDLE SUCTION POOLE (INSTRUMENTS) ×1 IMPLANT
KIT BASIN OR (CUSTOM PROCEDURE TRAY) ×3 IMPLANT
LIGASURE IMPACT 36 18CM CVD LR (INSTRUMENTS) ×3 IMPLANT
NEEDLE HYPO 22GX1.5 SAFETY (NEEDLE) IMPLANT
NS IRRIG 1000ML POUR BTL (IV SOLUTION) ×6 IMPLANT
PACK GENERAL/GYN (CUSTOM PROCEDURE TRAY) ×3 IMPLANT
RETAINER VISCERA MED (MISCELLANEOUS) IMPLANT
SEPRAFILM MEMBRANE 5X6 (MISCELLANEOUS) IMPLANT
SHEET LAVH (DRAPES) IMPLANT
SPONGE LAP 18X18 RF (DISPOSABLE) IMPLANT
SPONGE LAP 18X18 X RAY DECT (DISPOSABLE) ×3 IMPLANT
STAPLER VISISTAT 35W (STAPLE) ×3 IMPLANT
SUCTION POOLE HANDLE (INSTRUMENTS) ×3
SUT ETHILON 3 0 PS 1 (SUTURE) ×9 IMPLANT
SUT MNCRL AB 4-0 PS2 18 (SUTURE) IMPLANT
SUT PDS AB 1 TP1 96 (SUTURE) ×6 IMPLANT
SUT PLAIN 2 0 XLH (SUTURE) IMPLANT
SUT SILK 2 0 (SUTURE)
SUT SILK 2-0 18XBRD TIE 12 (SUTURE) IMPLANT
SUT VIC AB 0 CT1 18XCR BRD 8 (SUTURE) IMPLANT
SUT VIC AB 0 CT1 36 (SUTURE) IMPLANT
SUT VIC AB 0 CT1 8-18 (SUTURE)
SUT VIC AB 2-0 SH 27 (SUTURE) ×2
SUT VIC AB 2-0 SH 27X BRD (SUTURE) ×1 IMPLANT
SUT VIC AB 3-0 SH 27 (SUTURE) ×2
SUT VIC AB 3-0 SH 27X BRD (SUTURE) ×1 IMPLANT
SUT VIC AB 4-0 PS2 18 (SUTURE) IMPLANT
SUT VICRYL 0 TIES 12 18 (SUTURE) IMPLANT
SYR 30ML LL (SYRINGE) IMPLANT
SYRINGE IRR TOOMEY STRL 70CC (SYRINGE) ×3 IMPLANT
TAPE PAPER 3X10 WHT MICROPORE (GAUZE/BANDAGES/DRESSINGS) ×3 IMPLANT
TOWEL OR 17X26 10 PK STRL BLUE (TOWEL DISPOSABLE) ×3 IMPLANT
TOWEL OR NON WOVEN STRL DISP B (DISPOSABLE) ×3 IMPLANT
TRAY FOLEY MTR SLVR 14FR STAT (SET/KITS/TRAYS/PACK) IMPLANT
YANKAUER SUCT BULB TIP 10FT TU (MISCELLANEOUS) ×3 IMPLANT

## 2018-01-31 NOTE — Progress Notes (Signed)
Transfer back to ICU intubated and ventilated from OR (ex lap, abdominal washout).

## 2018-01-31 NOTE — Progress Notes (Signed)
CRITICAL VALUE ALERT  Critical Value:  Lactic Acid 2.7   Date & Time Notied: 01/30/18 09:00  Provider Notified: NP Marni Griffon verbally told   Orders Received/Actions taken: New orders give

## 2018-01-31 NOTE — Progress Notes (Signed)
Radiology called this RN regarding free air in pt's abdomen, suspect for bowel obstruction. NP notified, CT of ABD/Pelvis w/ contrast ordered. Will continue to monitor.

## 2018-01-31 NOTE — Progress Notes (Signed)
Called by bedside RN at approximately 0120 with concerns about severe hypertension (SBP 220's) and pt c/o chest pain. ECG similar to previous ones. Troponin negative. Pt previously given 3 doses of labetolol by oncall OBGYN. I ordered Vasotec IV and clonidine PO. Slight improvement noted in BP over an hour but BP continue to remain severely elevated despite intervention. Received an additional phone call around 0330. Pt BP going up and pt vomited a large amount of brown vomitus. Pt noted to be diaphoretic and increasingly agitated. Pt to be moved to SDU for cardene gtt and further monitoring.   Hypertensive Crisis -Labetolol previously given - Vasotec IV and Clonidine po given -Transfer to SDU for Cardene gtt  Chest pain/pressure - Troponin - ECG - Chest xray - Resolved after administration of Vasotec  Emesis/abdominal pain - KUB -Zofran PRN  Lovey Newcomer, NP [facility] 7p-7a [phone]

## 2018-01-31 NOTE — Progress Notes (Signed)
PROGRESS NOTE    Jo Parker  YKD:[id number] DOB: 11/22/1945 DOA: 01/27/2018 PCP: [name], MD  Brief Narrative:72 year old female with history of ovarian cancer with primary peritoneal carcinomatosis, COPD, OSA on CPAP, white coat hypertension syndrome, AAA. 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  August 22 patient undergoneExploratory laparotomy infra-gastric omentectomy bilateral salpingo-oophorectomy radical optimal tumor debulking, resection of anterior abdominal wallby Dr. Skeet Latch. 500 ml blood loss.  Yesterday (POD2) she was noted to be more lethargic. After receiving Oxy IR she received Narcan and seemed to be more alert since then.  Assessment & Plan:   Active Problems:   Primary peritoneal carcinomatosis ([name])   Severe protein-calorie malnutrition ([name])   Carcinomatosis ([name])   COPD (chronic obstructive pulmonary disease) ([name])   HLD (hyperlipidemia)   Absolute anemia   White coat syndrome with hypertension   Ovarian cancer ([place])   AKI (acute  kidney injury) ([place])   Acute metabolic encephalopathy   Acute respiratory distress   Low urine output   Somnolence   At risk for fluid volume overload  1] ischemic bowel with bowel perforation by CT scan that was done this morning 01/31/2018.  Being managed by GYN surgery.  Patient taken for further surgery today.  Patient is status post debulking of ovarian cancer.  Discussed with Dr. Delsa Sale.  Critical care consult has been placed by GYN.  2] uncontrolled hypertension on Cardene drip.  3] acute metabolic encephalopathy seems to be worsening due to #1, narcotics.  4] history of COPD stable at this time continue PRN nebulizer treatments.  5] AKI improved with IV fluids back to baseline stable.  6] history of diastolic CHF recent echo done shows good ejection fraction.  7] thrombocytopenia stable   DVT prophylaxis: Lovenox  Code Status: Full code Family Communication: Discussed with husband in the room  Subjective: Patient much more confused today.  She is not alert or oriented today overnight she has additional issues with elevated blood pressure ongoing nausea and vomiting and abdominal distention.  Patient was tachypneic tachycardic and hypertensive when I saw her.  She appeared confused and does not did not answer my questions appropriately like she did yesterday.  Her husband was by the bedside  Objective: Vitals:   01/31/18 1524 01/31/18 1525 01/31/18 1526 01/31/18 1527  BP:   138/72   Pulse:      Resp: (!) 35 (!) 32 (!) 30 (!) 29  Temp:      TempSrc:      SpO2:      Weight:      Height:        Intake/Output Summary (Last 24 hours) at 01/31/2018 1637 Last data filed at 01/31/2018 1427 Gross per 24 hour  Intake 1322.76 ml  Output 1525 ml  Net -202.24 ml   Filed Weights   01/27/18 0652 01/31/18 0412  Weight: 52.6 kg 58.3 kg    Examination:  General exam: Ill-appearing female in distress Respiratory system: Clear to auscultation.   Tachypneic Cardiovascular system: S1 & S2 heard, RRR. No JVD, murmurs, rubs, gallops or clicks. No pedal edema. Gastrointestinal system: Abdomen is distended soft and tender. No organomegaly or masses felt. No bowel sounds heard. Central nervous system: Alert and oriented. No focal neurological deficits. Extremitiesno edema Skin: No rashes, lesions or ulcers    Data Reviewed: I have personally reviewed following labs and imaging studies  CBC: Recent Labs  Lab 01/26/18 1457  01/29/18 0520 01/29/18 2113 01/30/18 0721 01/31/18 0309 01/31/18 0850  WBC 4.1   < > 4.4 3.5* 2.6* 3.1* 3.7*  NEUTROABS 1.5  --   --  2.6  --   --   --   HGB 10.5*   < > 6.7* 8.6* 9.5* 10.8* 10.1*  HCT 33.6*   < > 22.0* 27.1* 29.5* 32.2* 30.3*  MCV 69.6*   < > 72.1* 73.4* 72.5* 70.8* 71.1*  PLT 151   < > 94* 88* 100* 120* 142*   < > = values in this interval not displayed.   Basic Metabolic Panel: Recent Labs  Lab 01/28/18 1523 01/29/18 0520 01/29/18 1701 01/29/18 2113 01/30/18 0721 01/31/18 0309  NA 137 139 140  --  139 139  K 4.7 4.3 4.1  --  3.8 3.9  CL 107 113* 112*  --  111 108  CO2 26 22 21*  --  22 21*  GLUCOSE 134* 106* 117*  --  82 196*  BUN 28* 28* 28*  --  21 17  CREATININE 1.25* 0.98 1.14*  --  0.79 0.60  CALCIUM 8.1* 7.9* 8.3*  --  8.3* 9.0  MG  --   --   --  2.0  --   --   PHOS  --   --   --  2.9  --   --    GFR: Estimated Creatinine Clearance: 52.6 mL/min (by C-G formula based on SCr of 0.6 mg/dL). Liver Function Tests: Recent Labs  Lab 01/26/18 1457 01/29/18 1701  AST 23 32  ALT 20 20  ALKPHOS 115 64  BILITOT 0.4 1.2  PROT 7.4 5.7*  ALBUMIN 4.0 3.0*   No results for input(s): LIPASE, AMYLASE in the last 168 hours. Recent Labs  Lab 01/29/18 2130  AMMONIA 18   Coagulation Profile: Recent Labs  Lab 01/31/18 0850  INR 1.36   Cardiac Enzymes: Recent Labs  Lab 01/29/18 2113 01/30/18 0347 01/30/18 1310 01/31/18 0309 01/31/18 0850  CKTOTAL 197  --   --   --    --   TROPONINI <0.03 <0.03 <0.03 <0.03 <0.03   BNP (last 3 results) No results for input(s): PROBNP in the last 8760 hours. HbA1C: No results for input(s): HGBA1C in the last 72 hours. CBG: No results for input(s): GLUCAP in the last 168 hours. Lipid Profile: No results for input(s): CHOL, HDL, LDLCALC, TRIG, CHOLHDL, LDLDIRECT in the last 72 hours. Thyroid Function Tests: No results for input(s): TSH, T4TOTAL, FREET4, T3FREE, THYROIDAB in the last 72 hours. Anemia Panel: No results for input(s): VITAMINB12, FOLATE, FERRITIN, TIBC, IRON, RETICCTPCT in the last 72 hours. Sepsis Labs: Recent Labs  Lab 01/29/18 2130 01/31/18 0800  LATICACIDVEN 1.4 2.7*    Recent Results (from the past 240 hour(s))  Culture, Urine  Status: None   Collection Time: 01/29/18  5:00 PM  Result Value Ref Range Status   Specimen Description URINE, CATHETERIZED  Final   Special Requests   Final    Normal Performed at [facility], [place] [address], [place], [place] [address]    Culture NO GROWTH  Final   Report Status 01/31/2018 FINAL  Final  MRSA PCR Screening     Status: None   Collection Time: 01/30/18 12:17 AM  Result Value Ref Range Status   MRSA by PCR NEGATIVE NEGATIVE Final    Comment:        The GeneXpert MRSA Assay (FDA approved for NASAL specimens only), is one component of a comprehensive MRSA colonization surveillance program. It is not intended to diagnose MRSA infection nor to guide or monitor treatment for MRSA infections. Performed at [facility], [place] [address] [place], [place] [address]          Radiology Studies: Dg Abd 1 View  Result Date: 01/31/2018 CLINICAL DATA:  Confirm nasogastric tube placement EXAM: ABDOMEN - 1 VIEW COMPARISON:  Abdomen and pelvis CT from earlier today FINDINGS: Known large volume pneumoperitoneum. Dilated small bowel from ileus or ischemia. Nasogastric tube tip is at midline epigastrium. The side  port is over the lower esophagus. These results were called by telephone at the time of interpretation on 01/31/2018 at 2:34 pm to Dr. Salvadore Dom , who verbally acknowledged these results. Patient has been posted for the OR. Also in progress of calling findings surgeon. IMPRESSION: 1. Orogastric tube tip overlaps the epigastrium and may be at the proximal stomach. Advise against reposition given there is likely pneumatosis of the lower esophagus and proximal stomach. 2. Known large volume pneumoperitoneum and dilated small bowel. Electronically Signed   By: Monte Fantasia M.D.   On: 01/31/2018 14:36   Dg Abd 1 View  Result Date: 01/31/2018 CLINICAL DATA:  72 year old female with abdominal pain. EXAM: ABDOMEN - 1 VIEW COMPARISON:  CT of the abdomen pelvis dated 12/14/2017 FINDINGS: There is apparent air on both sides of the bowel loops (Rigler's sign) concerning for pneumoperitoneum. Clinical correlation and further evaluation with CT is recommended. There is dilatation of the small bowel measure up to 5 cm. The osseous structures are grossly unremarkable. There is atherosclerotic calcification of the aorta. Degenerative changes of the spine. IMPRESSION: Findings concerning for pneumoperitoneum, possibly secondary to small-bowel obstruction. Clinical correlation and further evaluation with CT of the abdomen and pelvis recommended. These results were called by telephone at the time of interpretation on 01/31/2018 at 4:46 am to nurse [place], who verbally acknowledged these results. Electronically Signed   By: Anner Crete M.D.   On: 01/31/2018 04:49   Ct Angio Chest Pe W Or Wo Contrast  Result Date: 01/30/2018 CLINICAL DATA:  PE suspected, high pretest prob. Tachycardia, hypotension, shortness of breath. Recent abdominal surgery. EXAM: CT ANGIOGRAPHY CHEST WITH CONTRAST TECHNIQUE: Multidetector CT imaging of the chest was performed using the standard protocol during bolus administration of intravenous  contrast. Multiplanar CT image reconstructions and MIPs were obtained to evaluate the vascular anatomy. CONTRAST:  183mL ISOVUE-370 IOPAMIDOL (ISOVUE-370) INJECTION 76% COMPARISON:  Radiographs yesterday.  Chest CT 10/23/2017 FINDINGS: Cardiovascular: There are no filling defects within the pulmonary arteries to the segmental level to suggest pulmonary embolus. Subsegmental branches cannot be assessed. Thoracic aorta is tortuous with atherosclerosis. 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 No periaortic stranding. No progression or change from prior exam.  Mild multi chamber cardiomegaly. There are coronary artery calcifications. Right chest port with tip in the atrial caval junction. Mediastinum/Nodes: No enlarged mediastinal or hilar lymph nodes. No axillary adenopathy. Normal thyroid gland. Distal esophagus is mildly patulous. Lungs/Pleura: Small bilateral pleural effusions with adjacent compressive atelectasis in the lower lobes, pleural effusions have diminished from prior CT. Paramediastinal ground-glass opacity in the left upper lobe adjacent to the hilum as well as peripheral ground-glass opacity in the left upper lobe image 42 series 6, are new from prior CT. Mild atelectasis in the right middle lobe. Mild emphysema. Trachea and mainstem bronchi are patent. No pulmonary mass or evident nodule. Upper Abdomen: Free air likely sequela of recent laparotomy. There is upper abdominal free fluid. Hyperdensity in the stomach likely ingested material. Musculoskeletal: There are no acute or suspicious osseous abnormalities. Implanted loop recorder in the left chest wall. Review of the MIP images confirms the above findings. IMPRESSION: 1. No pulmonary embolus. 2. Small bilateral pleural effusions and adjacent compressive atelectasis. Degree of pleural fluid has decreased from prior CT. 3. If you left upper lobe ground-glass opacities are nonspecific, may be infectious or inflammatory. This  does not have the appearance of pulmonary edema. 4. Aortic Atherosclerosis (ICD10-I70.0) and Emphysema (ICD10-J43.9). 5. 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 No adjacent stranding to suggest inflammation. 6. Free air in the upper abdomen related to recent laparotomy. There is free fluid as well. Electronically Signed   By: Jeb Levering M.D.   On: 01/30/2018 03:51   Ct Abdomen Pelvis W Contrast  Result Date: 01/31/2018 CLINICAL DATA:  72 year old female with abdominal pain. Pneumoperitoneum suspected on earlier radiograph. History of ovarian carcinoma. EXAM: CT ABDOMEN AND PELVIS WITH CONTRAST TECHNIQUE: Multidetector CT imaging of the abdomen and pelvis was performed using the standard protocol following bolus administration of intravenous contrast. CONTRAST:  163mL OMNIPAQUE IOHEXOL 300 MG/ML  SOLN COMPARISON:  Abdominal radiograph dated 01/31/2018 and CT of the abdomen pelvis dated 12/14/2017 FINDINGS: Lower chest: Partially visualized small bilateral pleural effusions with associated compressive atelectasis of the adjacent lower lobes. There is mild cardiomegaly with coronary vascular calcification. There is a large pneumoperitoneum. There is moderate amount of ascites. There is enhancement of the peritoneal reflection most prominent in the pelvis suspicious for peritonitis. Hepatobiliary: The liver is unremarkable. No intrahepatic biliary ductal dilatation. The gallbladder is unremarkable. Pancreas: Unremarkable. No pancreatic ductal dilatation or surrounding inflammatory changes. Spleen: Normal in size without focal abnormality. Adrenals/Urinary Tract: The adrenal glands are unremarkable. A 1.5 cm left renal parapelvic cyst. The kidneys are otherwise unremarkable. The urinary bladder is decompressed around a Foley catheter. Stomach/Bowel: There is sigmoid diverticulosis with muscular hypertrophy. There is intramural air in the  visualized distal esophagus and gastroesophageal junction consistent with pneumatosis. In addition there is a mildly dilated loop of small bowel in the mid to lower abdomen which measures up to 3.6 cm in diameter. There are innumerable small pockets of air along the wall of this bowel suspicious for pneumatosis. The distal small bowel are collapsed. No discrete transition identified. There is a pocket of extraluminal air adjacent to the sigmoid colon. Vascular/Lymphatic: There is advanced aortoiliac atherosclerotic disease. 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 There is air in the porta splenic confluence as well as portal venous gas within the liver. Reproductive: Hysterectomy. Other: Diffuse subcutaneous edema. Midline vertical anterior abdominal wall incisional scar. There is extension  of pelvic air into the left inguinal region. Musculoskeletal: No acute or significant osseous findings. IMPRESSION: 1. Findings most consistent with ischemic bowel with portal venous gas and bowel pneumatosis and large amount of pneumoperitoneum. The site of perforation is not determined on this CT. However, there are multiple pockets of air adjacent to the sigmoid colon. 2. Enhancement of the peritoneum concerning for peritonitis. Clinical correlation and surgical consult is advised. 3.  Aortic Atherosclerosis (ICD10-I70.0). These results were called by telephone at the time of interpretation on 01/31/2018 at 6:05 am to nurse [place], who verbally acknowledged these results. Electronically Signed   By: Anner Crete M.D.   On: 01/31/2018 06:07   Dg Chest Port 1 View  Result Date: 01/31/2018 CLINICAL DATA:  Status post a line placement. EXAM: PORTABLE CHEST 1 VIEW COMPARISON:  Portable chest x-ray of January 31, 2018 at 3:18 a.m. FINDINGS: The lungs are slightly less well inflated today. There is persistent increased density at the left lung base. There is no pneumothorax nor large  pleural effusion. The heart is mildly enlarged. The pulmonary vascularity is not clearly engorged. There is calcification in the wall of the aortic arch. The power port catheter tip projects over the distal third of the SVC. There is lucency under the hemidiaphragms slightly more conspicuous than on the earlier study. IMPRESSION: Decreased inflation of both lungs. Bibasilar atelectasis or developing pneumonia greatest on the left. Mild enlargement of the cardiac silhouette without definite pulmonary edema. The reservoir catheter tip projects over the distal third of the SVC. Increased conspicuity of lucency under the hemidiaphragms. This may reflect free extraluminal gas either postsurgical in nature or due to a perforated viscus. Correlation with the patient's postoperative course is needed. Abdominal and pelvic noncontrast CT scanning may be useful. Electronically Signed   By: David  [place]ique M.D.   On: 01/31/2018 11:12   Dg Chest Port 1 View  Result Date: 01/31/2018 CLINICAL DATA:  72 y/o  F; shortness of breath. EXAM: PORTABLE CHEST 1 VIEW COMPARISON:  01/30/2018 CT chest pain 01/29/2018 chest radiograph. FINDINGS: Stable cardiomegaly given projection and technique. Aortic atherosclerosis with calcification. Stable small bilateral effusions and bibasilar opacities compatible with atelectasis. No pleural effusion or pneumothorax. Diminished pneumoperitoneum. Right port catheter tip projects over the mid SVC. No acute osseous abnormality is evident. Loop recorder device noted. IMPRESSION: Stable small bilateral effusions and associated bibasilar atelectasis. Diminished pneumoperitoneum. Electronically Signed   By: Kristine Garbe M.D.   On: 01/31/2018 04:40   Dg Chest Port 1 View  Result Date: 01/29/2018 CLINICAL DATA:  Dyspnea.  Laparotomy 2 days ago. EXAM: PORTABLE CHEST 1 VIEW COMPARISON:  Chest radiographs earlier this day. FINDINGS: Tip of the right chest port in the mid SVC. Loop recorder  projects over the left chest wall. Low lung volumes with bibasilar opacities, left greater than right. Possible left pleural effusion. Slight improvement in cardiomegaly. Persistent but improving vascular congestion. No pneumothorax. Again seen pneumoperitoneum, likely related to recent laparotomy. IMPRESSION: 1. Improving vascular congestion. Slight improvement in cardiomegaly may be related to improved aeration. 2. Bibasilar opacities, left greater than right, likely atelectasis. Possible small left pleural effusion. 3. Pneumoperitoneum again seen, likely related to recent laparotomy. Electronically Signed   By: Jeb Levering M.D.   On: 01/29/2018 22:41        Scheduled Meds: . sodium chloride   Intravenous Once  . [MAR Hold] acetaminophen  1,000 mg Oral Q6H  . [MAR Hold] chlorhexidine  15 mL Mouth Rinse BID  . [  MAR Hold] ipratropium-albuterol  3 mL Nebulization TID  . [MAR Hold] mouth rinse  15 mL Mouth Rinse q12n4p   Continuous Infusions: . sodium chloride 150 mL/hr at 01/31/18 1427  . [MAR Hold] famotidine (PEPCID) IV Stopped (01/31/18 1141)  . lactated ringers 75 mL/hr at 01/31/18 1546  . [MAR Hold] meropenem (MERREM) IV    . niCARDipine 5 mg/hr (01/31/18 1517)  . phenylephrine       LOS: 4 days     Georgette Shell, MD [facility]  If 7PM-7AM, please contact night-coverage [email or web address] Password [facility] 01/31/2018, 4:37 PM

## 2018-01-31 NOTE — Evaluation (Addendum)
SLP Cancellation Note  Patient Details Name: Jo Parker MRN: [id number] DOB: April 10, 1946   Cancelled treatment:       Reason Eval/Treat Not Completed: Medical issues which prohibited therapy(pt with ischemic bowel and surgery consulted, npo, will continue efforts)   Macario Golds 01/31/2018, 8:02 AM

## 2018-01-31 NOTE — Progress Notes (Signed)
Per CCM MD Pt tidal volume not less than 325 mL/kg.

## 2018-01-31 NOTE — Progress Notes (Signed)
Patient's BP was rechecked after 5mg  labetalol IV and her BP was 196/113. MD Delsa Sale was notified and gave a verbal order of 10mg  labetalol IV once. Patient was also complaining of shortness of breath and discomfort in her chest. MD was notified. Verbal order of 2L of oxygen and EKG if chest discomfort continues.

## 2018-01-31 NOTE — Progress Notes (Addendum)
Jeannette Corpus, NP paged about recent CT ABD/Pelvis w/ Contrast results consistent with ischemic bowel. NPO diet and surgery consult ordered. Will continue to monitor.

## 2018-01-31 NOTE — Progress Notes (Signed)
[facility] Progress Note Patient Name: Idania Desouza DOB: [birth year] MRN: [id number]   Date of Service  01/31/2018  HPI/Events of Note  Hypotension - BP = 82/49 with MAP = 60.   eICU Interventions  Will order: 1. Phenylephrine IV infusion. Titrate to MAP > 65.     Intervention Category Major Interventions: Hypotension - evaluation and management  Sommer,Steven Cornelia Copa 01/31/2018, 9:59 PM

## 2018-01-31 NOTE — Progress Notes (Signed)
4 Days Post-Op Procedure(s) (LRB): OMENTECTOMY (N/A) RADICAL DEBULKING (N/A) EXPLORATORY LAPAROTOMY WITH REMOVAL OF BILATERAL TUBES AND OVARIES (N/A)  Subjective: Had BM yesterday. However early am had episode of vomiting and imaging ordered revealing probable perforation, question source, question ischemic bowel. Currently patient appears comfortable, however working to breathe and disoriented (which has been baseline for her over the weekend by the notes). Husband at bedside updated.  Objective: Vital signs in last 24 hours: Temp:  [97.5 F (36.4 C)-98.4 F (36.9 C)] 97.6 F (36.4 C) (08/26 0800) Pulse Rate:  [98-124] 120 (08/26 0746) Resp:  [22-33] 30 (08/26 0746) BP: (110-229)/(71-152) 150/78 (08/26 0700) SpO2:  [94 %-100 %] 95 % (08/26 0847) Weight:  [128 lb 8.5 oz (58.3 kg)] 128 lb 8.5 oz (58.3 kg) (08/26 0412) Last BM Date: 01/29/18  Intake/Output from previous day: 08/25 0701 - 08/26 0700 In: 872.4 [P.O.:60; I.V.:762.4; IV Piggyback:50] Out: 950 [Urine:950]  Physical Examination: General: disoriented to place Resp: Good air movement Cardio: Tachy GI: absent BS, distended; Wound CDI. No rebound or guarding. Mildly tender to palpation Extremities: No edema, NT  Labs: WBC/Hgb/Hct/Plts:  3.1/10.8/32.2/120 (08/26 0309) BUN/Cr/glu/ALT/AST/amyl/lip:  17/0.60/--/--/--/--/-- (08/26 0309)   Assessment:  71 y.o. s/p Procedure(s): OMENTECTOMY RADICAL DEBULKING EXPLORATORY LAPAROTOMY WITH REMOVAL OF BILATERAL TUBES AND OVARIES: stable  Pain:  Pain is controlled   Neuro: Delirium--likely 2/2 hypovolemia.  Exam stable.  Heme: Anemia secondary to blood loss; hemoglobin stable up s/p transfusion.  Mild thrombocytopenia-HIT less likely.  Platelet count stable    Pulm:  Dyspnea. O2 sats in range on RA.  CTA negative for PE; mild overload changes.  H/O malignant pleural effusions/COPD.  CV: Hypotension resolved/ B/Ps elevated.  H/O chronic HTN on a beta blocker; now tachy  likely 2/2 GI process  GI:  Possible bowel perforation   FEN: Intravascular volume improved after volume resuscitation and mobilization of third space fluid--BUN, chloride in range.  Lactic acid this morning elevated.   Renal: AKI/ prerenal.  aFENA 0.10%.  Urine output was improving.  Creatinine was downward trending over the weekend  Prophylaxis: pharmacologic prophylaxis (with any of the following: enoxaparin (Lovenox) 40mg  SQ 2 hours prior to surgery then every day) and intermittent pneumatic compression boots. Last Lovenox yesterday 10am,   Plan:  Consulted critical care Discussed with husband (and patient) surgery may be needed. Antibiotics to be started.    LOS: 4 days    Jo Parker 01/31/2018, 9:03 AM

## 2018-01-31 NOTE — Progress Notes (Signed)
[facility] Progress Note Patient Name: Jo Parker DOB: Sep 12, 1945 MRN: [id number]   Date of Service  01/31/2018  HPI/Events of Note  ABG on 100%/PRVC 24/TV 330/P12 = 7.15/50/63/17.  eICU Interventions  Will order: 1. Increase PRVC rate to 33. 2. NaHCO3 100 meq IV now.  3. NaHCO3 IV infusion to run IV at 125 mL/hour.  3. Repeat ABG at 11 PM.      Intervention Category Major Interventions: Acid-Base disturbance - evaluation and management;Respiratory failure - evaluation and management  Sommer,Steven Cornelia Copa 01/31/2018, 9:06 PM

## 2018-01-31 NOTE — Anesthesia Procedure Notes (Addendum)
Procedure Name: Intubation Date/Time: 01/31/2018 3:59 PM Performed by: Cynda Familia, CRNA Pre-anesthesia Checklist: Patient identified, Emergency Drugs available, Suction available and Patient being monitored Patient Re-evaluated:Patient Re-evaluated prior to induction Oxygen Delivery Method: Circle System Utilized Preoxygenation: Pre-oxygenation with 100% oxygen Induction Type: IV induction, Rapid sequence and Cricoid Pressure applied Laryngoscope Size: Glidescope Grade View: Grade I Tube type: Oral Number of attempts: 1 Airway Equipment and Method: Stylet and Video-laryngoscopy Placement Confirmation: ETT inserted through vocal cords under direct vision,  positive ETCO2 and breath sounds checked- equal and bilateral Secured at: 20 cm Tube secured with: Tape Dental Injury: Teeth and Oropharynx as per pre-operative assessment  Comments: IV induction Brock-- see quick note regarding prob aspiration -- Glidescope intubation-- bilat BS-- vent settings  by [employer]

## 2018-01-31 NOTE — Anesthesia Preprocedure Evaluation (Addendum)
Anesthesia Evaluation  Patient identified by MRN, date of birth, ID band Patient confused    Reviewed: Allergy & Precautions, NPO status , Patient's Chart, lab work & pertinent test results  History of Anesthesia Complications Negative for: history of anesthetic complications  Airway Mallampati: III  TM Distance: >3 FB   Mouth opening: Limited Mouth Opening  Dental  (+) Dental Advisory Given   Pulmonary sleep apnea , COPD, former smoker,    breath sounds clear to auscultation       Cardiovascular hypertension, Pt. on medications and Pt. on home beta blockers + Peripheral Vascular Disease  + Valvular Problems/Murmurs MVP  Rhythm:Regular Rate:Tachycardia   '19 TTE - Moderate concentric LVH. EF 65% to 70%. Grade 1 diastolic dysfunction. There was trivial MR.    Neuro/Psych PSYCHIATRIC DISORDERS Anxiety Depression  Postoperative confusion since previous operation negative neurological ROS     GI/Hepatic negative GI ROS, Neg liver ROS,   Endo/Other  negative endocrine ROS  Renal/GU negative Renal ROS  negative genitourinary   Musculoskeletal  (+) Arthritis ,   Abdominal   Peds  Hematology  (+) anemia ,  Thrombocytopenia Leukopenia    Anesthesia Other Findings   Reproductive/Obstetrics  Uterine cancer s/p partial hysterectomy                             Anesthesia Physical Anesthesia Plan  ASA: III and emergent  Anesthesia Plan: General   Post-op Pain Management:    Induction: Intravenous, Rapid sequence and Cricoid pressure planned  PONV Risk Score and Plan: 4 or greater and Treatment may vary due to age or medical condition, Ondansetron and Dexamethasone  Airway Management Planned: Oral ETT and Video Laryngoscope Planned  Additional Equipment: None  Intra-op Plan:   Post-operative Plan: Possible Post-op intubation/ventilation  Informed Consent: I have reviewed the  patients History and Physical, chart, labs and discussed the procedure including the risks, benefits and alternatives for the proposed anesthesia with the patient or authorized representative who has indicated his/her understanding and acceptance.   Dental advisory given  Plan Discussed with: CRNA and Anesthesiologist  Anesthesia Plan Comments:      Anesthesia Quick Evaluation

## 2018-01-31 NOTE — Progress Notes (Signed)
[facility] Progress Note Patient Name: Jo Parker DOB: March 02, 1946 MRN: [id number]   Date of Service  01/31/2018  HPI/Events of Note  Multiple issues: 1. Neutropenia - WBC = 1.4 and 1. Troponin = 0.03. No history of recent chemotherapy. Related to sepsis?  eICU Interventions  Will order: 1. Continue to trend WBC. 2. Continue to trend Troponin.  3. ASA 81 mg per tube now and Q day.     Intervention Category Major Interventions: Other:  Lysle Dingwall 01/31/2018, 9:50 PM

## 2018-01-31 NOTE — Progress Notes (Signed)
MD Delsa Sale was notified of patient's BP 180/98, low urine output, patient being diaphoretic, high HR 110, and patient having hallucinations during day shift. MD gave RN orders to give patient 5mg  of labetalol IV once, increase IV fluids to 160ml/hr, and if she had hallucinations to give her 0.5mg  of Haldol PO once.

## 2018-01-31 NOTE — Progress Notes (Signed)
  Echocardiogram 2D Echocardiogram has been performed.  Jo Parker 01/31/2018, 9:35 AM

## 2018-01-31 NOTE — Progress Notes (Signed)
PT Cancellation Note  Patient Details Name: Jo Parker MRN: [id number] DOB: July 29, 1945   Cancelled Treatment:    Reason Eval/Treat Not Completed: Medical issues which prohibited therapy, surgery consulted. Check back when medically ready.   Marcelino Freestone PT [phone]  01/31/2018, 8:55 AM

## 2018-01-31 NOTE — Care Management Note (Signed)
Case Management Note  Patient Details  Name: Lysa Livengood MRN: [id number] Date of Birth: 08/12/1945  Subjective/Objective:                  4 Days Post-Op Procedure(s) (LRB): OMENTECTOMY (N/A) RADICAL DEBULKING (N/A) EXPLORATORY LAPAROTOMY WITH REMOVAL OF BILATERAL TUBES AND OVARIES (N/A)  Subjective: Had BM yesterday. However early am had episode of vomiting and imaging ordered revealing probable perforation, question source, question ischemic bowel. Currently patient appears comfortable, however working to breathe and disoriented (which has been baseline for her over the weekend by the notes). Husband at bedside updated.  Objective: Vital signs in last 24 hours: Temp:  [97.5 F (36.4 C)-98.4 F (36.9 C)] 97.6 F (36.4 C) (08/26 0800) Pulse Rate:  [98-124] 120 (08/26 0746) Resp:  [22-33] 30 (08/26 0746) BP: (110-229)/(71-152) 150/78 (08/26 0700) SpO2:  [94 %-100 %] 95 % (08/26 0847) Weight:  [128 lb 8.5 oz (58.3 kg)] 128 lb 8.5 oz (58.3 kg) (08/26 0412) Last BM Date: 01/29/18  Action/Plan: 4 Days Post-Op Procedure(s) (LRB): OMENTECTOMY (N/A) RADICAL DEBULKING (N/A) EXPLORATORY LAPAROTOMY WITH REMOVAL OF BILATERAL TUBES AND OVARIES (N/A)  Subjective: Had BM yesterday. However early am had episode of vomiting and imaging ordered revealing probable perforation, question source, question ischemic bowel. Currently patient appears comfortable, however working to breathe and disoriented (which has been baseline for her over the weekend by the notes). Husband at bedside updated. will follow for progression and cm needs/from home with spouse support group is family. Objective: Vital signs in last 24 hours: Temp:  [97.5 F (36.4 C)-98.4 F (36.9 C)] 97.6 F (36.4 C) (08/26 0800) Pulse Rate:  [98-124] 120 (08/26 0746) Resp:  [22-33] 30 (08/26 0746) BP: (110-229)/(71-152) 150/78 (08/26 0700) SpO2:  [94 %-100 %] 95 % (08/26 0847) Weight:  [128 lb 8.5 oz (58.3 kg)] 128  lb 8.5 oz (58.3 kg) (08/26 0412) Last BM Date: 01/29/18  Expected Discharge Date:  01/29/18               Expected Discharge Plan:     In-House Referral:     Discharge planning Services     Post Acute Care Choice:    Choice offered to:     DME Arranged:    DME Agency:     HH Arranged:    [facility] Agency:     Status of Service:     If discussed at [employer], dates discussed:    Additional Comments:  Leeroy Cha, RN 01/31/2018, 10:25 AM

## 2018-01-31 NOTE — Progress Notes (Signed)
MD Blount was notified of patient's VS and EKG.

## 2018-01-31 NOTE — Transfer of Care (Signed)
Immediate Anesthesia Transfer of Care Note  Patient: Jo Parker  Procedure(s) Performed: EXPLORATORY LAPAROTOMY, ABDOMINAL WASHOUT (N/A )  Patient Location: ICU  Anesthesia Type:General  Level of Consciousness: unresponsive  Airway & Oxygen Therapy: Patient remains intubated per anesthesia plan and Patient placed on Ventilator (see vital sign flow sheet for setting)  Post-op Assessment: Report given to RN and Post -op Vital signs reviewed and stable  Post vital signs: Reviewed and stable  Last Vitals:  Vitals Value Taken Time  BP    Temp    Pulse 108 01/31/2018  6:19 PM  Resp 36 01/31/2018  6:19 PM  SpO2 95 % 01/31/2018  6:19 PM  Vitals shown include unvalidated device data.  Last Pain:  Vitals:   01/31/18 1215  TempSrc:   PainSc: 1       Patients Stated Pain Goal: 0 (93/23/55 7322)  Complications: No apparent anesthesia complications

## 2018-01-31 NOTE — Telephone Encounter (Signed)
Scheduled appt per 8/22 sch message - reminder letter sent in the mail with app date and time. vmail also left for pt.

## 2018-01-31 NOTE — Op Note (Addendum)
Date: 01/31/18  Preoperative Diagnosis:  Tachycardia, pneumoperitoneum, concern for bowel perforation.  Postoperative Diagnosis:  No evidence of obvious bowel perforation. No evidence of bowel ischemia.  Procedure(s) Performed:  Exploratory laparotomy with washout  Surgeon: Mart Piggs, MD  Assistant Surgeon: Everitt Amber, MD  Anesthesia: GETA  Specimens: None  Estimated Blood Loss: 100 mL.    Complications: None.   Operative Findings: No obvious air escaping upon opening of abdomen. Fibrinous exudate at sites of previous surgery. Ascites. JP drains in LUQ (#1) placed up toward the diaphragm. JP drain in RUQ (#2) placed in Morison's pouch and stretching toward the greater curvature of the stomach. JP drain in LLQ (#3) placed in the pelvis near the sigmoid colon.  Dr. Johney Maine with General Surgery was called to the operating room and assisted with insufflation of the rectum. Agreed we could not re-demonstrate air leak.   Procedure in Detail:    The patient was taken the operating room.  General endotracheal anesthesia was performed without complications.  The patient was placed in supine position.  She was prepped and draped in sterile fashion.  A timeout was performed.     A Foley catheter was already in place.   The previous midline incision was opened at the superior margin with the scalpel. This was carried down to the fascia and fascial suture was elevated and cut. The fascia was then opened. No obvious pneumoperitoneum evacuating. The stomach was examined and the greater curvature examined closely for any perforation. No obvious defect seen. The inferior portion of the incision was opened after cutting the fascial suture in this area. The pelvis was explored. Fibrinous exudate in areas of pelvic dissection but no feculent material or obvious injury.   A bookwalter retractor was placed.   Attention was turned to running the entire small bowel and no obvious defect seen. We  then ran the large bowel at all surfaces and again no obvious defect. All the bowel appeared healthy with no evidence of ischemia. We exposed the upper abdomen again and placed large amount of saline here. We then examined for any air leak and noted none. Both the anterior surface of the stomach and the inferior surface were examined and no obvious defect or injury and no air leak. We examined closely the duodenum, Morison's pouch, the hepatic and splenic flexures of the colon and no obvious perforation or injury noted. Additional fluid placed in the RUQ and Morison's pouch and no obvious air leak.   The bowel was packed into the upper abdomen. Attention was returned to the pelvis. We placed a large amount of saline in the pelvis and isolated a segment of the sigmoid colon. Opening of the lateral planes released some air into the fluid pocket, but this could not be re-demonstrated. We suctioned the saline out and placed a fresh liter into the pelvis. We placed a foley into the rectum and insufflated with air. The sigmoid was blocked at the pelvic brim. No air leak could be identified.   Copious irrigation was used.  Hemostasis was noted. JP drains x 3 were placed. Hemostasis noted. See findings.  Initial lap counts were correct.   The fascia was reapproximated with two #1-looped PDS using a total of two sutures, starting at either end, meeting in the midline and tying. The subcutaneous layer was then irrigated copiously. The skin was closed with staples.    Sponge, lap, and needle counts were correct x 2.   She was taken back  to the ICU intubated.

## 2018-01-31 NOTE — Progress Notes (Signed)
Pharmacy Antibiotic Note  Jo Parker is a 72 y.o. female admitted on 01/27/2018 with hx of ovarian cancer for bilateral salpingo-oophorectomy and omentectomy with debulking on 8/22.  She now has developed suspected intra-abdominal infection.  CT abdomen results worrisome for ischemic bowel with portal venous gas, bowel pneumatosis, and large amount of pneumoperitoneum without clear source of perforation.  Pharmacy has been consulted for meropenem dosing.  Lactic acid increased to 2.7 SCr  0.6 WBC 3.7  Tm 98.4, Tmin 97.5  Plan: Meropenem 1g IV q8h Follow up renal fxn, culture results, and clinical course.   Height: 5\' 3"  (160 cm) Weight: 128 lb 8.5 oz (58.3 kg) IBW/kg (Calculated) : 52.4  Temp (24hrs), Avg:97.9 F (36.6 C), Min:97.5 F (36.4 C), Max:98.4 F (36.9 C)  Recent Labs  Lab 01/28/18 1523 01/29/18 0520 01/29/18 1701 01/29/18 2113 01/29/18 2130 01/30/18 0721 01/31/18 0309  WBC 6.9 4.4  --  3.5*  --  2.6* 3.1*  CREATININE 1.25* 0.98 1.14*  --   --  0.79 0.60  LATICACIDVEN  --   --   --   --  1.4  --   --     Estimated Creatinine Clearance: 52.6 mL/min (by C-G formula based on SCr of 0.6 mg/dL).    Allergies  Allergen Reactions  . Oxycodone     confusion    Antimicrobials this admission: 8/26 Meropenem >>   Dose adjustments this admission:   Microbiology results: 8/24 UCx: NGF 8/25 MRSA PCR: negative  Thank you for allowing pharmacy to be a part of this patient's care.  Gretta Arab PharmD, BCPS Pager [phone] 01/31/2018 8:54 AM

## 2018-01-31 NOTE — Progress Notes (Signed)
MD Glennon Mac was notified of patient's BP 216/125, HR 110, and EKG showed sinus tachycardia, left anterior fascicular block, possible lateral infarct, and cannot rule out inferior infarct. Verbal was to give 10mg  labetalol IV once and to call hospitalist.

## 2018-01-31 NOTE — Evaluation (Signed)
SLP Cancellation Note  Patient Details Name: Jo Parker MRN: [id number] DOB: Nov 25, 1945   Cancelled treatment:       Reason Eval/Treat Not Completed: Other (comment)(order for another swallow received, pt continues to be npo for bowel surgery, will follow up after surgery)   Macario Golds 01/31/2018, 1:52 PM  Luanna Salk, [place] [facility] SLP [phone]

## 2018-01-31 NOTE — Progress Notes (Signed)
PCCM progress note  Patient returns after exploratory lap laparotomy Operative findings noted with no obvious air leak or perforation Patient probably aspirated during intubation per anesthesia note  Examined in ICU on the vent, hypoxic with sats in the mid 85  Blood pressure 138/72, pulse (!) 124, temperature 99.2 F (37.3 C), temperature source Axillary, resp. rate (!) 29, height 5\' 3"  (1.6 m), weight 58.3 kg, SpO2 98 %. Gen:      No acute distress, chronically ill-appearing HEENT:  EOMI, sclera anicteric, ET tube Neck:     No masses; no thyromegaly Lungs:    Clear to auscultation bilaterally; normal respiratory effort CV:         Regular rate and rhythm; no murmurs Abd:      Distended, diminished bowel sounds, abdominal drains, dressings in place. Ext:    No edema; adequate peripheral perfusion Skin:      Warm and dry; no rash Neuro: sedated, unresponsive  ABG 7.22/39/228/100% at 530 Chest x-ray 8/26-ET tube in good position, with worsening bilateral airspace disease right greater than left.  Assessment/plan: Ovarian cancer status post bilateral salpingo-oophorectomy and omentectomy with debulking on 8/22 Exploratory laparotomy 8/26 Acute respiratory failure with worsening sepsis Suspect she may be developing ARDS from aspiration during intubation  Increase PEEP to 12 Start low tidal volume ventilation. Target 6cc/kg, plt < 30 Follow ABG Lasix 40 mg x 1  The patient is critically ill with multiple organ system failure and requires high complexity decision making for assessment and support, frequent evaluation and titration of therapies, advanced monitoring, review of radiographic studies and interpretation of complex data.   Additional critical Care Time devoted to patient care services, exclusive of separately billable procedures, described in this note is 35 minutes.   Marshell Garfinkel MD [facility] Pager [phone] If no answer or after 3pm  call: [phone] 01/31/2018, 7:17 PM

## 2018-01-31 NOTE — Progress Notes (Signed)
Discussed with family and patient decision to go to surgery. Concern is bowel perforation. Explained possibility of bowel resection, repair, reaanstomosis, and ostomy if necessary. Explained critical care will be necessary perioperatively and she will be intubated and sedated postop for some time. Asked ICU to place NGT and have posted her on the OR schedule. Plan will be exploration, possible bowel resection/reanastomosis, possible ostomy and any necessary procedure. Patient had last Lovenox 10am yesterday. Antibiotics have been started. IV hydration has been initiated.

## 2018-01-31 NOTE — Anesthesia Postprocedure Evaluation (Signed)
Anesthesia Post Note  Patient: Jo Parker  Procedure(s) Performed: EXPLORATORY LAPAROTOMY, ABDOMINAL WASHOUT (N/A )     Patient location during evaluation: ICU Anesthesia Type: General Level of consciousness: sedated and patient remains intubated per anesthesia plan Pain management: pain level controlled Vital Signs Assessment: post-procedure vital signs reviewed and stable Respiratory status: patient remains intubated per anesthesia plan Cardiovascular status: stable Postop Assessment: no apparent nausea or vomiting Anesthetic complications: yes Anesthetic complication details: anesthesia complicationsComments: Patient aspirated following induction of anesthesia (see quick note from intraop record). Brought to ICU intubated, ventilating well.     Last Vitals:  Vitals:   01/31/18 1527 01/31/18 1811  BP:    Pulse:    Resp: (!) 29   Temp:    SpO2:  98%    Last Pain:  Vitals:   01/31/18 1215  TempSrc:   PainSc: [place]

## 2018-01-31 NOTE — Consult Note (Addendum)
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  She is completed 3 cycles of chemotherapy and is now status post bilateral salpingo-oophorectomy and omentectomy with debulking on 8/22. 8/23: Difficult with analgesic control.  This was complicated by patient becoming somnolent and hypotensive after adjustment of narcotics, treated with Saline and adjustment of medications. 8/24: Still somnolent, noted to be confused.  Is fused 1 unit for hemoglobin of 6.7.  Internal medicine consulted for acute encephalopathy also postoperative acute kidney injury.  8/25: Remains encephalopathic.  Hemoglobin improved posttransfusion.  Progress note commenting on dyspnea.  Had episode of vomiting shortly after arriving in [place] unit.  More hypertensive and tachycardic.   8/26: Remained hypertensive having some chest pain but remained confused troponins were negative.  Again vomiting.  Transferred to stepdown unit for Cardene drip.  X-ray showing evidence of free air so CT abdomen/pelvis ordered.  CT abdomen results worrisome for ischemic bowel with portal venous gas, bowel pneumatosis, and large amount of pneumoperitoneum without clear source of perforation.  Remains hypertensive, tachycardic, tachypneic, confused.  Lactic acid up to 2.7.  Seen by GYN/ONC surgery, general surgery consulted, critical care asked to assist with care. Bolus NS, Imipenem started.   Assessment & Plan:   Severe sepsis in the setting of probable postoperative peritonitis and ischemic bowel.  CT abdomen showing free air as well as findings concerning for bowel ischemia -Currently receiving crystalloid bolus -imipenem administered Plan Complete saline bolus Preoperative  labs Twelve-lead Continue imipenem day 1 Anticipate she will return back from the operating room critically ill  Tachycardia and hypertension; I suspect she will return postoperatively hypotensive -Cardiac enzymes negative Plan Fluid bolus Saline Nicardipine drip for systolic blood pressure greater than 825  Acute metabolic encephalopathy.  Suspect this is multifactorial and secondary to sepsis as well as residual narcotics and metabolic acidosis Plan Supportive care RASS goal -1 if intubated  At risk for recurrent AKI.  Did have hemodynamic mediated acute kidney injury postoperatively this had resolved. Plan Mean arterial pressure goal greater than 65 Renal dose is indicated Strict intake and output  Mild lactic acidosis Plan Fluid bolus Avoid hypotension Supportive care  Tachypnea -Suspect this is primarily metabolically driven. Portable chest x-ray personally reviewed: This demonstrates mild pneumoperitoneum.  Small bilateral effusions and atelectasis. Plan Obtain arterial blood gas Continued pulse oximetry Oxygen as needed  Postoperative anemia. Hemoglobin stable status post transfusion on 8/23 Plan Trend CBC Transfuse for hemoglobin less than 7 or symptomatic anemia with associated hypotension  Mild thrombocytopenia Plan Trend CBC  Protein calorie malnutrition in the setting of cancer Plan N.p.o. for now May need TNA  Stage IV ovarian cancer, now status post bilateral salpingo-oophorectomy omentectomy and debulking surgery on 8/22 Plan Per GYN/ONC Best practice/Goals of care/disposition.   DVT prophylaxis: LMWH-->SCD (8/26) GI prophylaxis: NA-->H2B Diet: NPO Mobility:BR Code Status: Full Code Family Communication: husband and son updated   Disposition / Summary of Today's Plan 01/31/18    72 year old female with stage IV ovarian cancer now status post bilateral helping oophorectomy, omentectomy, and debulking surgery on 8/22.  Diagnostic imaging  concerning for bowel perforation, ischemia, and peritonitis.  She is septic, and critically ill.  She will need exploratory laparotomy.  IV fluids have been administered, Lovenox has  been stopped, imipenem has been empirically initiated.  We will continue to assist with her pre-and postoperative care.  I anticipate a rather difficult postoperative course   Consultants:  Internal medicine 8/25 Critical care 8/26 General surgery 8/26  Procedures: bilateral salpingo-nephrectomy omentectomy and debulking surgery on 8/22   Significant Diagnostic Tests: CT abdomen 8/26: Hemic bowel with portal venous gas and bowel pneumatosis with large amount of pneumoperitoneum.  Unclear as to site of perforation.  Multiple pockets of air adjacent to the sigmoid colon.  Enhancement of the peritoneum concerning for peritonitis.  There was aortic arterial sclerosis.  Micro Data: Culture times two 8/26 Antimicrobials:  Meropenem 8/26  Subjective  Critically ill  Objective    Examination: General: Chronically ill-appearing 72 year old female currently disoriented, confused, and tachypneic.  She does exhibit some mild accessory use HENT: Mucous membranes moist, neck veins flat.  Sclera nonicteric.  She does have temporal wasting Lungs: Diminished, tachypneic, accessory use. Cardiovascular: Cardiac regular rate and rhythm Abdomen: Distended, tympanic percussion, hypoactive.  Midabdominal dressing is intact Extremities: Warm, dry currently, brisk capillary refill Neuro: Awake, confused.  Moves all extremities.  Generalized weakness GU: Clear yellow via Foley catheter  Blood pressure (Abnormal) 150/78, pulse (Abnormal) 120, temperature 97.6 F (36.4 C), temperature source Axillary, resp. rate (Abnormal) 30, height 5\' 3"  (1.6 m), weight 58.3 kg, SpO2 95 %.        Intake/Output Summary (Last 24 hours) at 01/31/2018 0853 Last data filed at 01/31/2018 8832 Gross per 24 hour  Intake 872.43 ml  Output 950 ml   Net -77.57 ml   Filed Weights   01/27/18 0652 01/31/18 0412  Weight: 52.6 kg 58.3 kg     Labs   CBC: Recent Labs  Lab 01/26/18 1457  01/28/18 1523 01/29/18 0520 01/29/18 2113 01/30/18 0721 01/31/18 0309  WBC 4.1   < > 6.9 4.4 3.5* 2.6* 3.1*  NEUTROABS 1.5  --   --   --  2.6  --   --   HGB 10.5*   < > 7.9* 6.7* 8.6* 9.5* 10.8*  HCT 33.6*   < > 25.0* 22.0* 27.1* 29.5* 32.2*  MCV 69.6*   < > 71.0* 72.1* 73.4* 72.5* 70.8*  PLT 151   < > 108* 94* 88* 100* 120*   < > = values in this interval not displayed.   Basic Metabolic Panel: Recent Labs  Lab 01/28/18 1523 01/29/18 0520 01/29/18 1701 01/29/18 2113 01/30/18 0721 01/31/18 0309  NA 137 139 140  --  139 139  K 4.7 4.3 4.1  --  3.8 3.9  CL 107 113* 112*  --  111 108  CO2 26 22 21*  --  22 21*  GLUCOSE 134* 106* 117*  --  82 196*  BUN 28* 28* 28*  --  21 17  CREATININE 1.25* 0.98 1.14*  --  0.79 0.60  CALCIUM 8.1* 7.9* 8.3*  --  8.3* 9.0  MG  --   --   --  2.0  --   --   PHOS  --   --   --  2.9  --   --    GFR: Estimated Creatinine Clearance: 52.6 mL/min (by C-G formula based on SCr of 0.6 mg/dL). Recent Labs  Lab 01/29/18 0520 01/29/18 2113 01/29/18 2130 01/30/18 0721 01/31/18 0309  WBC 4.4 3.5*  --  2.6* 3.1*  LATICACIDVEN  --   --  1.4  --   --    Liver Function Tests: Recent  Labs  Lab 01/26/18 1457 01/29/18 1701  AST 23 32  ALT 20 20  ALKPHOS 115 64  BILITOT 0.4 1.2  PROT 7.4 5.7*  ALBUMIN 4.0 3.0*   No results for input(s): LIPASE, AMYLASE in the last 168 hours. Recent Labs  Lab 01/29/18 2130  AMMONIA 18   ABG    Component Value Date/Time   HCO3 20.5 01/29/2018 2054   ACIDBASEDEF 4.7 (H) 01/29/2018 2054   O2SAT 90.7 01/29/2018 2054    Coagulation Profile: No results for input(s): INR, PROTIME in the last 168 hours. Cardiac Enzymes: Recent Labs  Lab 01/29/18 2113 01/30/18 0347 01/30/18 1310 01/31/18 0309  CKTOTAL 197  --   --   --   TROPONINI <0.03 <0.03 <0.03 <0.03    HbA1C: No results found for: HGBA1C CBG: No results for input(s): GLUCAP in the last 168 hours.   Review of Systems:    Unable d/t delirium   Past medical history   Past Medical History:  Diagnosis Date  . Anxiety and depression   . Aortic aneurysm ([place])    see ov note 03/2017   . Arthritis   . Carcinomatosis ([place]) 10/2017  . COPD (chronic obstructive pulmonary disease) ([place]) 10/23/2017  . Depression   . Family history of breast cancer   . Family history of prostate cancer   . Family history of uterine cancer   . History of diverticulitis   . HLD (hyperlipidemia) 10/23/2017  . Hypercholesterolemia   . Hypertension   . Mitral valve prolapse   . Sleep apnea    cpap machine mask and tubing in [place] per patient.  Patient does not have mask or tubing to bring on DOS of 01/27/2018.   Marland Kitchen Uterine cancer ([place])    had partial hysterectomy at 46   Social History   Social History   Socioeconomic History  . Marital status: Married    Spouse name: Not on file  . Number of children: 3  . Years of education: Not on file  . Highest education level: Not on file  Occupational History  . Occupation: retired  Scientific laboratory technician  . Financial resource strain: Not on file  . Food insecurity:    Worry: Not on file    Inability: Not on file  . Transportation needs:    Medical: Not on file    Non-medical: Not on file  Tobacco Use  . Smoking status: Former Smoker    Packs/day: 1.00    Years: 55.00    Pack years: 55.00    Last attempt to quit: 10/06/2017    Years since quitting: 0.3  . Smokeless tobacco: Never Used  Substance and Sexual Activity  . Alcohol use: Never    Frequency: Never  . Drug use: Never  . Sexual activity: Not Currently  Lifestyle  . Physical activity:    Days per week: Not on file    Minutes per session: Not on file  . Stress: Not on file  Relationships  . Social connections:    Talks on phone: Not on file    Gets together: Not on file    Attends  religious service: Not on file    Active member of club or organization: Not on file    Attends meetings of clubs or organizations: Not on file    Relationship status: Not on file  . Intimate partner violence:    Fear of current or ex partner: Not on file    Emotionally abused: Not on  file    Physically abused: Not on file    Forced sexual activity: Not on file  Other Topics Concern  . Not on file  Social History Narrative  . Not on file    Family history    Family History  Problem Relation Age of Onset  . Endometrial cancer Sister 65       endometrial ca  . Endometrial cancer Paternal Aunt 80  . Breast cancer Paternal Aunt   . Stomach cancer Maternal Grandfather        d. 96s  . Prostate cancer Paternal Uncle   . Breast cancer Cousin        pat first cousin  . Breast cancer Cousin        pat first cousin's daughter  . Lung cancer Father        lung ca  . Heart disease Mother   . Stroke Paternal Grandmother   . Endometrial cancer Cousin        paternal first cousin's daughter  . Breast cancer Other        niece dx less than 74s  . Breast cancer Other        niece, dx under 31    Allergies Allergies  Allergen Reactions  . Oxycodone     confusion    Home meds  Prior to Admission medications   Medication Sig Start Date End Date Taking? Authorizing Provider  aspirin EC 81 MG tablet Take 81 mg by mouth daily.   Yes [provider]  atorvastatin (LIPITOR) 80 MG tablet Take 80 mg by mouth daily.   Yes [provider]  budesonide-formoterol (SYMBICORT) 160-4.5 MCG/ACT inhaler Inhale 2 puffs into the lungs 2 (two) times daily. Use two puffs by mouth twice daily(rinse mouth after use)   Yes [provider]  carvedilol (COREG) 12.5 MG tablet Take 12.5 mg by mouth 2 (two) times daily with a meal.   Yes [provider]  citalopram (CELEXA) 20 MG tablet Take 1 tablet (20 mg total) by mouth daily. 10/28/17  Yes Aline August, MD  flintstones  complete (FLINTSTONES) 60 MG chewable tablet Chew 2 tablets by mouth daily.   Yes [provider]  Melatonin 5 MG CAPS Take 10 mg by mouth at bedtime as needed (for sleep).    Yes [provider]  HYDROcodone-acetaminophen (NORCO) 10-325 MG tablet Take 1 tablet by mouth every 4 (four) hours as needed for moderate pain or severe pain. Patient not taking: Reported on 01/14/2018 11/29/17   [name], MD  meclizine (ANTIVERT) 25 MG tablet Take 25 mg by mouth 3 (three) times daily as needed for dizziness.    [provider]  mirtazapine (REMERON) 30 MG tablet Take 1 tablet (30 mg total) by mouth at bedtime. Patient not taking: Reported on 01/14/2018 11/29/17   [name], MD  ondansetron (ZOFRAN) 4 MG tablet Take 4 mg by mouth every 6 (six) hours as needed for nausea or vomiting.    [provider]  polyethylene glycol (MIRALAX / GLYCOLAX) packet Take 17 g by mouth daily. Patient not taking: Reported on 01/14/2018 10/27/17   Aline August, MD  prochlorperazine (COMPAZINE) 10 MG tablet Take 1 tablet (10 mg total) by mouth every 6 (six) hours as needed (Nausea or vomiting). Patient not taking: Reported on 01/14/2018 10/13/17   [name], MD  triamcinolone (NASACORT) 55 MCG/ACT AERO nasal inhaler Place 1 spray into the nose 2 (two) times daily. Patient not taking: Reported on  01/14/2018 12/20/17   [name], MD     LOS: 4 days    Erick Colace ACNP-BC [place] Pager # [phone] OR # [phone] if no answer

## 2018-01-31 NOTE — Significant Event (Signed)
Rapid Response Event Note  Overview: Time Called: 0220 Arrival Time: 0225 Event Type: Other (Comment)(Hypertension)  Initial Focused Assessment: Patient is awake, alert in bed. Following commands, PERRLA. BP 198/144. HR 105. RR 21. 98% on 2LNC. Patient has no complaints of pain. No abdominal tenderness. Lung sounds are clear, diminished in the right lower lobe. No JVD. No edema.   Interventions: -Catapres PO given per NP, recheck BP within 1 hour. If no improvement will need IV gtt for blood pressure control. -CXR ordered  -Fluids stopped -Troponin collected -EKG completed  Patient vomited large amounts of brown liquid. NP notified and at bedside. Transfer orders written. KUB ordered. Cardene gtt ordered.  Plan of Care (if not transferred): Transferred to SD unit.  Event Summary:  Jo Parker

## 2018-02-01 ENCOUNTER — Encounter: Payer: Self-pay | Admitting: Genetic Counselor

## 2018-02-01 ENCOUNTER — Encounter (HOSPITAL_COMMUNITY): Payer: Self-pay | Admitting: Obstetrics

## 2018-02-01 DIAGNOSIS — D61818 Other pancytopenia: Secondary | ICD-10-CM

## 2018-02-01 DIAGNOSIS — E872 Acidosis: Secondary | ICD-10-CM

## 2018-02-01 DIAGNOSIS — R579 Shock, unspecified: Secondary | ICD-10-CM

## 2018-02-01 DIAGNOSIS — Z1379 Encounter for other screening for genetic and chromosomal anomalies: Secondary | ICD-10-CM | POA: Insufficient documentation

## 2018-02-01 DIAGNOSIS — Z1501 Genetic susceptibility to malignant neoplasm of breast: Secondary | ICD-10-CM | POA: Insufficient documentation

## 2018-02-01 DIAGNOSIS — Z1509 Genetic susceptibility to other malignant neoplasm: Secondary | ICD-10-CM

## 2018-02-01 LAB — BASIC METABOLIC PANEL
Anion gap: 14 (ref 5–15)
BUN: 24 mg/dL — AB (ref 8–23)
CHLORIDE: 110 mmol/L (ref 98–111)
CO2: 23 mmol/L (ref 22–32)
CREATININE: 1.14 mg/dL — AB (ref 0.44–1.00)
Calcium: 6.7 mg/dL — ABNORMAL LOW (ref 8.9–10.3)
GFR calc Af Amer: 54 mL/min — ABNORMAL LOW (ref >60)
GFR calc non Af Amer: 47 mL/min — ABNORMAL LOW (ref >60)
Glucose, Bld: 110 mg/dL — ABNORMAL HIGH (ref 70–99)
Potassium: 3.8 mmol/L (ref 3.5–5.1)
Sodium: 147 mmol/L — ABNORMAL HIGH (ref 135–145)

## 2018-02-01 LAB — GLUCOSE, CAPILLARY
Glucose-Capillary: 54 mg/dL — ABNORMAL LOW (ref 70–99)
Glucose-Capillary: 101 mg/dL — ABNORMAL HIGH (ref 70–99)
Glucose-Capillary: 108 mg/dL — ABNORMAL HIGH (ref 70–99)
Glucose-Capillary: 95 mg/dL (ref 70–99)

## 2018-02-01 LAB — BLOOD GAS, ARTERIAL
ACID-BASE EXCESS: 0.1 mmol/L (ref 0.0–2.0)
Acid-base deficit: 0.4 mmol/L (ref 0.0–2.0)
BICARBONATE: 26.4 mmol/L (ref 20.0–28.0)
BICARBONATE: 26.8 mmol/L (ref 20.0–28.0)
DRAWN BY: 422461
Drawn by: 257701
FIO2: 100
FIO2: 60
LHR: 36 {breaths}/min
O2 SAT: 94.8 %
O2 Saturation: 93.5 %
PCO2 ART: 57.6 mmHg — AB (ref 32.0–48.0)
PEEP/CPAP: 12 cmH2O
PEEP/CPAP: 12 cmH2O
PH ART: 7.284 — AB (ref 7.350–7.450)
PH ART: 7.293 — AB (ref 7.350–7.450)
Patient temperature: 98.9
Patient temperature: 37.6
RATE: 33 resp/min
VT: 330 mL
VT: 330 mL
pCO2 arterial: 57.5 mmHg — ABNORMAL HIGH (ref 32.0–48.0)
pO2, Arterial: 81 mmHg — ABNORMAL LOW (ref 83.0–108.0)
pO2, Arterial: 78.1 mmHg — ABNORMAL LOW (ref 83.0–108.0)

## 2018-02-01 LAB — CBC
HCT: 22.4 % — ABNORMAL LOW (ref 36.0–46.0)
Hemoglobin: 7.4 g/dL — ABNORMAL LOW (ref 12.0–15.0)
MCH: 24.3 pg — AB (ref 26.0–34.0)
MCHC: 33 g/dL (ref 30.0–36.0)
MCV: 73.4 fL — AB (ref 78.0–100.0)
PLATELETS: 55 10*3/uL — AB (ref 150–400)
RBC: 3.05 MIL/uL — ABNORMAL LOW (ref 3.87–5.11)
RDW: 18.6 % — ABNORMAL HIGH (ref 11.5–15.5)
WBC: 1.6 10*3/uL — ABNORMAL LOW (ref 4.0–10.5)

## 2018-02-01 LAB — TROPONIN I
TROPONIN I: 0.04 ng/mL — AB (ref <0.03)
Troponin I: 0.04 ng/mL (ref <0.03)

## 2018-02-01 LAB — LACTIC ACID, PLASMA
LACTIC ACID, VENOUS: 3.3 mmol/L — AB (ref 0.5–1.9)
Lactic Acid, Venous: 2.4 mmol/L (ref 0.5–1.9)

## 2018-02-01 LAB — PREPARE RBC (CROSSMATCH)

## 2018-02-01 MED ORDER — TBO-FILGRASTIM 300 MCG/0.5ML ~~LOC~~ SOSY
300.0000 ug | PREFILLED_SYRINGE | Freq: Every day | SUBCUTANEOUS | Status: DC
  Administered 2018-02-01: 300 ug via SUBCUTANEOUS
  Filled 2018-02-01: qty 0.5

## 2018-02-01 MED ORDER — BISACODYL 10 MG RE SUPP
10.0000 mg | Freq: Every day | RECTAL | Status: DC
  Administered 2018-02-01 – 2018-02-03 (×3): 10 mg via RECTAL
  Filled 2018-02-01 (×3): qty 1

## 2018-02-01 MED ORDER — FENTANYL CITRATE (PF) 100 MCG/2ML IJ SOLN: 12.5000 ug | INTRAMUSCULAR | Status: DC | PRN

## 2018-02-01 MED ORDER — SODIUM BICARBONATE 8.4 % IV SOLN
100.0000 meq | Freq: Once | INTRAVENOUS | Status: AC
  Administered 2018-02-01: 100 meq via INTRAVENOUS
  Filled 2018-02-01: qty 100

## 2018-02-01 MED ORDER — VANCOMYCIN HCL 10 G IV SOLR
1250.0000 mg | INTRAVENOUS | Status: AC
  Administered 2018-02-01: 1250 mg via INTRAVENOUS
  Filled 2018-02-01: qty 1250

## 2018-02-01 MED ORDER — PROPOFOL 1000 MG/100ML IV EMUL
0.0000 ug/kg/min | INTRAVENOUS | Status: DC
  Administered 2018-02-01 – 2018-02-02 (×3): 25 ug/kg/min via INTRAVENOUS
  Filled 2018-02-01 (×3): qty 100

## 2018-02-01 MED ORDER — ASPIRIN 300 MG RE SUPP: 300.0000 mg | Freq: Every day | RECTAL | Status: DC

## 2018-02-01 MED ORDER — NOREPINEPHRINE 4 MG/250ML-% IV SOLN
0.0000 ug/min | INTRAVENOUS | Status: DC
  Administered 2018-02-01: 2 ug/min via INTRAVENOUS
  Administered 2018-02-01: 7 ug/min via INTRAVENOUS
  Administered 2018-02-01: 6 ug/min via INTRAVENOUS
  Administered 2018-02-02: 4 ug/min via INTRAVENOUS
  Filled 2018-02-01 (×3): qty 250

## 2018-02-01 MED ORDER — SODIUM CHLORIDE 0.9 % IV SOLN
0.0000 ug/min | INTRAVENOUS | Status: DC
  Filled 2018-02-01: qty 4

## 2018-02-01 MED ORDER — SODIUM CHLORIDE 0.9 % IV SOLN
200.0000 mg | Freq: Once | INTRAVENOUS | Status: AC
  Administered 2018-02-01: 200 mg via INTRAVENOUS
  Filled 2018-02-01: qty 200

## 2018-02-01 MED ORDER — LACTATED RINGERS IV SOLN
INTRAVENOUS | Status: DC
  Administered 2018-02-01 – 2018-02-02 (×3): via INTRAVENOUS

## 2018-02-01 MED ORDER — CHLORHEXIDINE GLUCONATE CLOTH 2 % EX PADS: 6.0000 | MEDICATED_PAD | Freq: Every day | CUTANEOUS | Status: DC

## 2018-02-01 MED ORDER — SODIUM CHLORIDE 0.9 % IV BOLUS
1000.0000 mL | Freq: Once | INTRAVENOUS | Status: AC
  Administered 2018-02-01: 1000 mL via INTRAVENOUS

## 2018-02-01 MED ORDER — SODIUM CHLORIDE 0.9 % IV SOLN
100.0000 mg | INTRAVENOUS | Status: DC
  Administered 2018-02-02 – 2018-02-28 (×27): 100 mg via INTRAVENOUS
  Filled 2018-02-01 (×28): qty 100

## 2018-02-01 MED ORDER — ACETAMINOPHEN 10 MG/ML IV SOLN
1000.0000 mg | INTRAVENOUS | Status: AC
  Administered 2018-02-01: 1000 mg via INTRAVENOUS
  Filled 2018-02-01: qty 100

## 2018-02-01 MED ORDER — SODIUM CHLORIDE 0.9% IV SOLUTION
Freq: Once | INTRAVENOUS | Status: AC
  Administered 2018-02-01: 09:00:00 via INTRAVENOUS

## 2018-02-01 MED ORDER — VANCOMYCIN HCL IN DEXTROSE 1-5 GM/200ML-% IV SOLN
1000.0000 mg | INTRAVENOUS | Status: DC
  Administered 2018-02-02 – 2018-02-03 (×2): 1000 mg via INTRAVENOUS
  Filled 2018-02-01 (×2): qty 200

## 2018-02-01 MED ORDER — FENTANYL 2500MCG IN NS 250ML (10MCG/ML) PREMIX INFUSION
0.0000 ug/h | INTRAVENOUS | Status: DC
  Administered 2018-02-01: 25 ug/h via INTRAVENOUS
  Filled 2018-02-01: qty 250

## 2018-02-01 MED ORDER — PANTOPRAZOLE SODIUM 40 MG IV SOLR
40.0000 mg | Freq: Two times a day (BID) | INTRAVENOUS | Status: DC
  Administered 2018-02-01 – 2018-02-15 (×30): 40 mg via INTRAVENOUS
  Filled 2018-02-01 (×31): qty 40

## 2018-02-01 MED ORDER — SODIUM CHLORIDE 0.9% IV SOLUTION
Freq: Once | INTRAVENOUS | Status: AC
  Administered 2018-02-01: 13:00:00 via INTRAVENOUS

## 2018-02-01 MED ORDER — SODIUM CHLORIDE 0.9% FLUSH
10.0000 mL | INTRAVENOUS | Status: DC | PRN
  Administered 2018-02-15 – 2018-02-28 (×2): 10 mL
  Administered 2018-03-04: 40 mL
  Filled 2018-02-01 (×3): qty 40

## 2018-02-01 MED ORDER — FENTANYL CITRATE (PF) 100 MCG/2ML IJ SOLN: 100.0000 ug | INTRAMUSCULAR | Status: DC | PRN

## 2018-02-01 MED ORDER — SODIUM CHLORIDE 0.9% FLUSH
10.0000 mL | Freq: Two times a day (BID) | INTRAVENOUS | Status: DC
  Administered 2018-02-01 – 2018-02-05 (×9): 10 mL
  Administered 2018-02-06: 30 mL
  Administered 2018-02-06 – 2018-02-14 (×15): 10 mL
  Administered 2018-02-14: 40 mL
  Administered 2018-02-15 – 2018-02-26 (×21): 10 mL
  Administered 2018-02-26 – 2018-02-27 (×2): 30 mL
  Administered 2018-02-27 – 2018-03-04 (×10): 10 mL

## 2018-02-01 MED ORDER — DEXTROSE 50 % IV SOLN
INTRAVENOUS | Status: AC
  Administered 2018-02-01: 50 mL
  Filled 2018-02-01: qty 50

## 2018-02-01 NOTE — Progress Notes (Addendum)
[place] Progress Note Patient Name: Jo Parker DOB: 07/14/1945 MRN: [id number]   Date of Service  02/01/2018  HPI/Events of Note  ABG on 100%/PRVC 33/TV 330/P 12 = 7.24/53.4/62.0/  eICU Interventions  Will order: 1. NaHCO3 100 meq IV now.  2. Continue NaHCO3 IV infusion.  3. Repeat ABG at 5 AM.     Intervention Category Major Interventions: Acid-Base disturbance - evaluation and management;Respiratory failure - evaluation and management  Cloteal Isaacson Cornelia Copa 02/01/2018, 12:17 AM

## 2018-02-01 NOTE — Progress Notes (Signed)
Physical Therapy Discharge Patient Details Name: Jo Parker MRN: [id number] DOB: June 18, 1945 Today's Date: 02/01/2018 Time:  -     Patient discharged from PT services secondary to medical decline - will need to re-order PT to resume therapy services.  P    Claretha Cooper 02/01/2018, 9:03 AM Tresa Endo PT [phone]

## 2018-02-01 NOTE — Care Management Note (Signed)
Case Management Note  Patient Details  Name: Jo Parker MRN: [id number] Date of Birth: July 14, 1945  Subjective/Objective:                  Patient returns after exploratory lap laparotomy Operative findings noted with no obvious air leak or perforation Patient probably aspirated during intubation per anesthesia note  Examined in ICU on the vent, hypoxic with sats in the mid 85 Assessment/plan: Ovarian cancer status post bilateral salpingo-oophorectomy and omentectomy with debulking on 8/22 Exploratory laparotomy 8/26 Acute respiratory failure with worsening sepsis Suspect she may be developing ARDS from aspiration during intubation  Increase PEEP to 12 Start low tidal volume ventilation. Target 6cc/kg, plt < 30 Follow ABG Lasix 40 mg x 1  The patient is critically ill with multiple organ system failure and requires high complexity decision making for assessment and support, frequent evaluation and titration of therapies, advanced monitoring, review of radiographic studies and interpretation of complex data.  Action/Plan: Following for progression and cm needs-remaions on vent.  Expected Discharge Date:  01/29/18               Expected Discharge Plan:  Home/Self Care  In-House Referral:     Discharge planning Services  CM Consult  Post Acute Care Choice:    Choice offered to:     DME Arranged:    DME Agency:     HH Arranged:    HH Agency:     Status of Service:  In process, will continue to follow  If discussed at Long Length of Stay Meetings, dates discussed:    Additional Comments:  Leeroy Cha, RN 02/01/2018, 9:17 AM

## 2018-02-01 NOTE — Progress Notes (Signed)
Peripherally Inserted Central Catheter/Midline Placement  The IV Nurse has discussed with the patient and/or persons authorized to consent for the patient, the purpose of this procedure and the potential benefits and risks involved with this procedure.  The benefits include less needle sticks, lab draws from the catheter, and the patient may be discharged home with the catheter. Risks include, but not limited to, infection, bleeding, blood clot (thrombus formation), and puncture of an artery; nerve damage and irregular heartbeat and possibility to perform a PICC exchange if needed/ordered by physician.  Alternatives to this procedure were also discussed.  Bard Power PICC patient education guide, fact sheet on infection prevention and patient information card has been provided to patient /or left at bedside.    PICC/Midline Placement Documentation  PICC Triple Lumen 02/01/18 PICC Left Brachial 43 cm (Active)  Indication for Insertion or Continuance of Line Prolonged intravenous therapies 02/01/2018  9:32 AM  Exposed Catheter (cm) 0 cm 02/01/2018  9:32 AM  Site Assessment Clean;Dry;Intact 02/01/2018  9:32 AM  Lumen #1 Status Flushed;Saline locked;Blood return noted 02/01/2018  9:32 AM  Lumen #2 Status Flushed;Saline locked;Blood return noted 02/01/2018  9:32 AM  Lumen #3 Status Flushed;Saline locked;Blood return noted 02/01/2018  9:32 AM  Dressing Type Transparent;Securing device 02/01/2018  9:32 AM  Dressing Status Clean;Dry;Intact 02/01/2018  9:32 AM  Dressing Change Due 02/08/18 02/01/2018  9:32 AM   PICC placed by Valentina Shaggy RN    [name] 02/01/2018, 9:36 AM

## 2018-02-01 NOTE — Progress Notes (Signed)
CRITICAL VALUE ALERT  Critical Value: Lactic 2.4  Date & Time Notied:  02/01/2018 2103  Provider Notified: E-Link MD notified  Orders Received/Actions taken: Awaiting orders

## 2018-02-01 NOTE — Progress Notes (Signed)
GYN ONC NOTE  Jo Parker  Is POD 5 status post exploratory laparotomy bilateral salpingo-oophorectomy omentectomy optimal debulking.  Postop course complicated by acute kidney the injury with altered mental status.  Imaging consisting of a CT of abdomen and pelvis was worrisome for ischemic bowel with portal venous gas bowel pneumatosis and pneumoperitoneum.  She underwent an exploratory laparotomy 24 hours.  Intraoperative exploration did not identify ischemic bowel or a source of perforation.  The abdomen was irrigated, drains placed.  Postoperatively she remains intubated and continues to require pharmacologic pressure support.  Discussion was held with the patient's husband and son sister and daughter-in-law regarding intraoperative findings at the time of the initial surgical intervention and the course so far.  The current multiorgan system failure was discussed and also the fact that no clear progress has been identified since surgery.  Discussed that if her condition deteriorates there may not be room for more meaningful intervention.    I shared my personal disappointment that postoperative course has been so disheartening for the patient and their family.  I expressed my regret that they and Jo Parker are not having the outcome of the expected.  All of their questions were answered to their satisfaction.  Francetta Found. Skeet Latch MD., PhD

## 2018-02-01 NOTE — Addendum Note (Signed)
Addendum  created 02/01/18 0641 by Lollie Sails, CRNA   Charge Capture section accepted

## 2018-02-01 NOTE — Progress Notes (Signed)
Pharmacy Antibiotic Note  Candiss Galeana is a 72 y.o. female admitted on 01/27/2018 with hx of ovarian cancer for bilateral salpingo-oophorectomy and omentectomy with debulking on 8/22.  She now has developed suspected intra-abdominal infection.  CT abdomen results worrisome for ischemic bowel with portal venous gas, bowel pneumatosis, and large amount of pneumoperitoneum without clear source of perforation.  Pharmacy has been consulted for meropenem dosing on 8/26 and added vancomycin on 8/27 for pneumonia.  Anidulafungin added for empiric coverage given concern for intra-abdominal infection, ongoing fever.  Lactic acid further increased to 3.3 SCr  0.63 WBC decreased to 1.6 Tm 102.3  Plan:  Meropenem 1g IV q8h  Vancomycin 1250 mg IV x1 followed by 1g IV q24h.  Anidulafungin 200mg  IV x1 followed by 100mg  IV q24h  Check vancomycin levels if remains on vancomycin > 3-4 days.  Goal AUC 400-500.  Follow up renal fxn, culture results, and clinical course.   Height: 5\' 3"  (160 cm) Weight: 148 lb 9.4 oz (67.4 kg) IBW/kg (Calculated) : 52.4  Temp (24hrs), Avg:99.7 F (37.6 C), Min:98.5 F (36.9 C), Max:102.3 F (39.1 C)  Recent Labs  Lab 01/29/18 0520 01/29/18 1701  01/29/18 2130 01/30/18 0721 01/31/18 0309 01/31/18 0800 01/31/18 0850 01/31/18 2021 02/01/18 0130  WBC 4.4  --    < >  --  2.6* 3.1*  --  3.7* 1.4* 1.6*  CREATININE 0.98 1.14*  --   --  0.79 0.60  --   --  0.63  --   LATICACIDVEN  --   --   --  1.4  --   --  2.7*  --   --  3.3*   < > = values in this interval not displayed.    Estimated Creatinine Clearance: 58.6 mL/min (by C-G formula based on SCr of 0.63 mg/dL).    Allergies  Allergen Reactions  . Oxycodone     confusion    Antimicrobials this admission: 8/26 Meropenem>> 8/27 Vancomycin >> 8/27 Anidulafungin >>   Dose adjustments this admission:   Microbiology results: 8/24 UCx: NGF 8/25 MRSA PCR: negative 8/26 BCx: 8/27 Trach asp:    Thank you for allowing pharmacy to be a part of this patient's care.  Gretta Arab PharmD, BCPS Pager [phone] 02/01/2018 9:00 AM

## 2018-02-01 NOTE — Progress Notes (Addendum)
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  She is completed 3 cycles of chemotherapy and is now status post bilateral salpingo-oophorectomy and omentectomy with debulking on 8/22. 8/23: Difficult with analgesic control.  This was complicated by patient becoming somnolent and hypotensive after adjustment of narcotics, treated with Saline and adjustment of medications. 8/24: Still somnolent, noted to be confused.  Is fused 1 unit for hemoglobin of 6.7.  Internal medicine consulted for acute encephalopathy also postoperative acute kidney injury.  8/25: Remains encephalopathic.  Hemoglobin improved posttransfusion.  Progress note commenting on dyspnea.  Had episode of vomiting shortly after arriving in [facility] unit.  More hypertensive and tachycardic.   8/26: Remained hypertensive having some chest pain but remained confused troponins were negative.  Again vomiting.  Transferred to stepdown unit for Cardene drip.  X-ray showing evidence of free air so CT abdomen/pelvis ordered.  CT abdomen results worrisome for ischemic bowel with portal venous gas, bowel pneumatosis, and large amount of pneumoperitoneum without clear source of perforation.  Remains hypertensive, tachycardic, tachypneic, confused.  Lactic acid up to 2.7.  Imipenem started.  Went for exploratory laparotomy with washout.  There is no evidence of obvious bowel perforation or ischemia, abdomen was closed, return to the intensive care with 2 JP drains in the right upper quadrant and one in the left lower quadrant.  Postoperative chest x-ray showing evidence of aspiration pneumonia; mixed respiratory and metabolic acidosis.  Bicarbonate infusion started 8/27: Hypotensive, chest x-ray with  significant right greater than left airspace disease.  Remains on low-dose pressors, still sedated, hemoglobin dropped from 8.8 to 7.4; febrile, adding vancomycin.   Assessment & Plan:   Severe sepsis, fever  with neutropenia: Initially thought secondary to peritonitis, but went for exploratory laparotomy on 8/26 and could not find obvious source of perforation or evidence of contamination.  Does have aspiration pneumonia currently as of 8/26.  -I still do not have a clear explanation for the free air and radiographic findings preoperatively Plan Adding vancomycin, and antifungal Day #2 meropenem Transduce CVP goal 8-12 Mean arterial pressure goal greater than 65 Getting blood for symptomatic anemia PRN tylenol  Status post exploratory laparotomy for free air on 8/26 ; following bilateral salpingo-oophorectomy, omentectomy, and debulking surgery on 8/22 Plan Continue n.p.o. Status Routine dressing care Will discuss with surgical services timing of using GI route for nutrition  Acute hypoxic respiratory failure in the setting of aspiration pneumonia Portable chest x-ray from 8/20 6 PM personally reviewed today: Showing endotracheal tube in satisfactory position.  Port unremarkable.  Right greater than left airspace disease looks worse compared to preop Plan Continuing full ventilator support VAP interventions Checking sputum culture Weaning PEEP and FiO2 PAD protocol RASS goal -2 to  -3  Acute metabolic encephalopathy.  Suspect this is multifactorial and secondary to sepsis as well as residual narcotics and metabolic acidosis Plan Supportive care PAD protocol  RASS goal -2 to -3  At risk for recurrent AKI.  Did have hemodynamic mediated acute kidney injury postoperatively this had resolved. Plan Ensure adequate mean arterial pressure Renal dose medications Strict intake output  Non-anion gap metabolic acidosis, complicated by lactic acidosis, and respiratory acidosis as  well -Bicarbonate infusion started 8/26; minute ventilation optimized Plan Repeat arterial  blood gas at 12 noon today Consider changing bicarbonate infusion to lactated Ringer's  Postoperative anemia. Hemoglobin stable status post transfusion on 8/23; now hypotensive with hemoglobin drip greater than 1 g postop on 8/27 Plan Transfusing 2 units per symptomatic anemia Holding anticoagulation Trend CBC  thrombocytopenia Plan CBC Holding anticoagulation  Protein calorie malnutrition in the setting of cancer Plan N.p.o., may need TNA  Stage IV ovarian cancer, now status post bilateral salpingo-oophorectomy omentectomy and debulking surgery on 8/22 Plan Per gyn/onc  Best practice/Goals of care/disposition.   DVT prophylaxis: LMWH-->SCD (8/26) GI prophylaxis: NA-->H2B Diet: NPO Mobility:BR Code Status: Full Code Family Communication: husband and son updated   Disposition / Summary of Today's Plan 02/01/18   Critically ill.  Currently in septic shock and acute hypoxic respiratory failure in the setting of aspiration pneumonia.  Review of OR notes suggest no clear source of free air in abdomen or evidence of contamination.  Still difficult to explain the radiographic findings preoperatively.  Today we will continue supportive care, we have added vancomycin as well as antifungal support given ongoing fever.  We will work on weaning FiO2 and PEEP.  We will transfuse today for symptomatic anemia.  She remains critically ill her prognosis is grim   Consultants:  Internal medicine 8/25 Critical care 8/26 General surgery 8/26  Procedures: bilateral salpingo-nephrectomy omentectomy and debulking surgery on 8/22 8/26: Intubation 8/26: Exploratory laparotomy, washout, placement of right upper quadrant x2 JP drain and left lower quadrant JP drain  Significant Diagnostic Tests: CT abdomen 8/26: Hemic bowel with portal venous gas and bowel pneumatosis with large amount of  pneumoperitoneum.  Unclear as to site of perforation.  Multiple pockets of air adjacent to the sigmoid colon.  Enhancement of the peritoneum concerning for peritonitis.  There was aortic arterial sclerosis.  Micro Data: Blood Culture times two 8/26>>> Sputum 8/27>>>  Antimicrobials:  Meropenem 8/26>>> Vancomycin 8/27>>> Eraxis 8/27>>> Subjective  Critically ill on vent   Objective    Examination: General: 71 year old female, malnourished, sedated on full ventilator support HEENT normocephalic atraumatic orally intubated, she does have temporal wasting Pulmonary: Right greater than left rhonchi, no accessory use.  Equal chest rise on ventilator Cardiac: Tachycardic regular rate and rhythm Abdomen: Soft, less tympanic to percussion.  Dressing intact, hypoactive bowel sounds, right upper quadrant JP x2, left lower quadrant trach JP x1 GU: Yellow Neuro: Sedated on ventilator Extremities: Diffuse anasarca warm to touch, brisk cap refill on pulses  Blood pressure 112/60, pulse (Abnormal) 126, temperature (Abnormal) 102.3 F (39.1 C), temperature source Oral, resp. rate (Abnormal) 33, height 5\' 3"  (1.6 m), weight 67.4 kg, SpO2 98 %. CVP:  [0 mmHg-23 mmHg] 18 mmHg  Vent Mode: PRVC FiO2 (%):  [70 %-100 %] 70 % Set Rate:  [18 bmp-33 bmp] 33 bmp Vt Set:  [330 mL-420 mL] 330 mL PEEP:  [5 CWC37-62 cmH20] 12 cmH20 Plateau Pressure:  [18 cmH20-25 cmH20] 25 cmH20   Intake/Output Summary (Last 24 hours) at 02/01/2018 0858 Last data filed at 02/01/2018 0500 Gross per 24 hour  Intake 6290.31 ml  Output 2330 ml  Net 3960.31 ml   Filed Weights   01/27/18 0652 01/31/18 0412 02/01/18 0500  Weight: 52.6 kg 58.3 kg 67.4 kg     Labs   CBC: Recent Labs  Lab 01/26/18 1457  01/29/18 2113 01/30/18 0721 01/31/18 0309 01/31/18 0850 01/31/18 1632 01/31/18 1728 01/31/18 2021 02/01/18 0130  WBC 4.1   < > 3.5* 2.6* 3.1* 3.7*  --   --  1.4* 1.6*  NEUTROABS 1.5  --  2.6  --   --   --   --   --    --   --   HGB 10.5*   < > 8.6* 9.5* 10.8* 10.1* 8.2* 8.8* 11.4* 7.4*  HCT 33.6*   < > 27.1* 29.5* 32.2* 30.3* 24.0* 26.0* 34.5* 22.4*  MCV 69.6*   < > 73.4* 72.5* 70.8* 71.1*  --   --  72.9* 73.4*  PLT 151   < > 88* 100* 120* 142*  --   --  85* 55*   < > = values in this interval not displayed.   Basic Metabolic Panel: Recent Labs  Lab 01/29/18 0520 01/29/18 1701 01/29/18 2113 01/30/18 0721 01/31/18 0309 01/31/18 1632 01/31/18 1728 01/31/18 2021  NA 139 140  --  139 139 143 145 143  K 4.3 4.1  --  3.8 3.9 3.7 3.6 3.8  CL 113* 112*  --  111 108  --   --  115*  CO2 22 21*  --  22 21*  --   --  20*  GLUCOSE 106* 117*  --  82 196*  --   --  112*  BUN 28* 28*  --  21 17  --   --  18  CREATININE 0.98 1.14*  --  0.79 0.60  --   --  0.63  CALCIUM 7.9* 8.3*  --  8.3* 9.0  --   --  7.8*  MG  --   --  2.0  --   --   --   --   --   PHOS  --   --  2.9  --   --   --   --   --    GFR: Estimated Creatinine Clearance: 58.6 mL/min (by C-G formula based on SCr of 0.63 mg/dL). Recent Labs  Lab 01/29/18 2130  01/31/18 0309 01/31/18 0800 01/31/18 0850 01/31/18 2021 02/01/18 0130  WBC  --    < > 3.1*  --  3.7* 1.4* 1.6*  LATICACIDVEN 1.4  --   --  2.7*  --   --  3.3*   < > = values in this interval not displayed.   Liver Function Tests: Recent Labs  Lab 01/26/18 1457 01/29/18 1701  AST 23 32  ALT 20 20  ALKPHOS 115 64  BILITOT 0.4 1.2  PROT 7.4 5.7*  ALBUMIN 4.0 3.0*   No results for input(s): LIPASE, AMYLASE in the last 168 hours. Recent Labs  Lab 01/29/18 2130  AMMONIA 18   ABG    Component Value Date/Time   PHART 7.284 (L) 02/01/2018 0432   PCO2ART 57.6 (H) 02/01/2018 0432   PO2ART 81.0 (L) 02/01/2018 0432   HCO3 26.4 02/01/2018 0432   TCO2 17 (L) 01/31/2018 1728   ACIDBASEDEF 0.4 02/01/2018 0432   O2SAT 94.8 02/01/2018 0432    Coagulation Profile: Recent Labs  Lab 01/31/18 0850  INR 1.36   Cardiac Enzymes: Recent Labs  Lab 01/29/18 2113  01/30/18 1310  01/31/18 0309 01/31/18 0850 01/31/18 2021 02/01/18 0130  CKTOTAL 197  --   --   --   --   --   --   TROPONINI <0.03   < > <0.03 <0.03 <0.03 0.03* <0.03   < > = values in this interval not displayed.   HbA1C: No results found for: HGBA1C CBG: Recent Labs  Lab 01/31/18 2341  GLUCAP 78      Kahley Leib E  [facility] ACNP-BC [place] Pager # [phone] OR # [phone] if no answer

## 2018-02-01 NOTE — Consult Note (Signed)
Reason for Consult: ? GI bleed Referring Physician: Gyn Onc  Jo Parker HPI: This is a 72 year old female with a PMH of Stage IV ovarian cancer with carcinomatosis.  She underwent debulking of the peritoneal carcinomatosis, resection of the anterior abdominal wall, bilateral salpingo-oophorectomy, and infra-gastric omentectomy on 01/27/2018 with Dr. Skeet Latch.  The surgery was reported to be performed without any immediate complications, but she has had a very complicated post-operative course.  She developed AMS and it was felt to be secondary to her narcotic medications.  Her mental status did improve with minimizing her narcotics as well as being dosed with Narcan.  The Narcan also helped with  Her anxiety and SOB complaints.  She was transferred to the floor, but upon transfer she vomited.  The vomiting continued and she was noted to be hypertensive, tachycardic and confused.  There was the report that her vomit was significant for brown material.  An NG tube was placed and a KUB was performed to confirm placement.  Pneumoperitoneum was found and a CT scan was obtained, which was highly suggestive of bowel ischemia.  Pneumatosis and portal venous gas were found.  She was emergently taken to the OR, but there was no gross evidence of ischemia or any overt site of perforation.  The patient remains critically ill.  The NG tube has approximately drained 700 ml of dark greenish liquid.  Past Medical History:  Diagnosis Date  . Anxiety and depression   . Aortic aneurysm ([place])    see ov note 03/2017   . Arthritis   . Carcinomatosis ([place]) 10/2017  . COPD (chronic obstructive pulmonary disease) ([place]) 10/23/2017  . Depression   . Family history of breast cancer   . Family history of prostate cancer   . Family history of uterine cancer   . History of diverticulitis   . HLD (hyperlipidemia) 10/23/2017  . Hypercholesterolemia   . Hypertension   . Mitral valve prolapse   . Sleep apnea    cpap machine  mask and tubing in [place] per patient.  Patient does not have mask or tubing to bring on DOS of 01/27/2018.   Marland Kitchen Uterine cancer ([place])    had partial hysterectomy at 27    Past Surgical History:  Procedure Laterality Date  . ABDOMINAL HYSTERECTOMY    . cardiac monitor implant    . CEREBRAL ANEURYSM REPAIR    . CERVICAL SPINE SURGERY     bone spurs  . DEBULKING N/A 01/27/2018   Procedure: RADICAL DEBULKING;  Surgeon: [name], MD;  Location: [facility];  Service: Gynecology;  Laterality: N/A;  . IR FLUORO GUIDE PORT INSERTION RIGHT  10/14/2017  . IR PARACENTESIS  10/14/2017  . IR US GUIDE VASC ACCESS RIGHT  10/14/2017  . LAPAROTOMY N/A 01/27/2018   Procedure: EXPLORATORY LAPAROTOMY WITH REMOVAL OF BILATERAL TUBES AND OVARIES;  Surgeon: [name], MD;  Location: [facility];  Service: Gynecology;  Laterality: N/A;  . LAPAROTOMY N/A 01/31/2018   Procedure: EXPLORATORY LAPAROTOMY, ABDOMINAL WASHOUT;  Surgeon: Isabel Caprice, MD;  Location: [facility];  Service: Gynecology;  Laterality: N/A;  . OMENTECTOMY N/A 01/27/2018   Procedure: OMENTECTOMY;  Surgeon: [name], MD;  Location: [facility];  Service: Gynecology;  Laterality: N/A;  . PARTIAL HYSTERECTOMY  1971   "precancer"    Family History  Problem Relation Age of Onset  . Endometrial cancer Sister 76       endometrial ca  . Endometrial cancer Paternal Aunt 80  .  Breast cancer Paternal Aunt   . Stomach cancer Maternal Grandfather        d. 78s  . Prostate cancer Paternal Uncle   . Breast cancer Cousin        pat first cousin  . Breast cancer Cousin        pat first cousin's daughter  . Lung cancer Father        lung ca  . Heart disease Mother   . Stroke Paternal Grandmother   . Endometrial cancer Cousin        paternal first cousin's daughter  . Breast cancer Other        niece dx less than 60s  . Breast cancer Other        niece, dx under 40    Social History:  reports that she quit smoking about 3 months ago. She has a  55.00 pack-year smoking history. She has never used smokeless tobacco. She reports that she does not drink alcohol or use drugs.  Allergies:  Allergies  Allergen Reactions  . Oxycodone     confusion    Medications:  Scheduled: . bisacodyl  10 mg Rectal Daily  . chlorhexidine  15 mL Mouth Rinse BID  . chlorhexidine gluconate (MEDLINE KIT)  15 mL Mouth Rinse BID  . Chlorhexidine Gluconate Cloth  6 each Topical Daily  . ipratropium-albuterol  3 mL Nebulization TID  . mouth rinse  15 mL Mouth Rinse q12n4p  . pantoprazole (PROTONIX) IV  40 mg Intravenous Q12H  . sodium chloride flush  10-40 mL Intracatheter Q12H  . Tbo-filgastrim (GRANIX) SQ  300 mcg Subcutaneous q1800   Continuous: . anidulafungin 200 mg (02/01/18 1239)   Followed by  . [START ON 02/02/2018] anidulafungin    . fentaNYL infusion INTRAVENOUS 25 mcg/hr (02/01/18 1223)  . lactated ringers 125 mL/hr at 02/01/18 1143  . meropenem (MERREM) IV Stopped (02/01/18 1233)  . norepinephrine (LEVOPHED) Adult infusion 8 mcg/min (02/01/18 1253)  . phenylephrine (NEO-SYNEPHRINE) Adult infusion 100 mcg/min (02/01/18 1005)  . propofol (DIPRIVAN) infusion 25 mcg/kg/min (02/01/18 1319)  . [START ON 02/02/2018] vancomycin      Results for orders placed or performed during the hospital encounter of 01/27/18 (from the past 24 hour(s))  Prepare RBC     Status: None   Collection Time: 01/31/18  3:38 PM  Result Value Ref Range   Order Confirmation      ORDER PROCESSED BY BLOOD BANK Performed at [facility] [address], [place], [place] [address]   Type and screen     Status: None (Preliminary result)   Collection Time: 01/31/18  3:38 PM  Result Value Ref Range   ABO/RH(D) B POS    Antibody Screen NEG    Sample Expiration 02/03/2018    Unit Number [id number]    Blood Component Type RBC, LR IRR    Unit division 00    Status of Unit ISSUED    Transfusion Status OK TO TRANSFUSE    Crossmatch Result  Compatible    Unit Number [id number]    Blood Component Type RCLI PHER 1    Unit division 00    Status of Unit ISSUED,FINAL    Transfusion Status OK TO TRANSFUSE    Crossmatch Result Compatible    Unit Number [id number]    Blood Component Type RBC, LR IRR    Unit division 00    Status of Unit ISSUED    Transfusion Status OK TO TRANSFUSE  Crossmatch Result      Compatible Performed at [facility], [place] [address], [place], [place] [address]    Unit Number [id number]    Blood Component Type RCLI PHER 2    Unit division 00    Status of Unit ALLOCATED    Transfusion Status OK TO TRANSFUSE    Crossmatch Result Compatible    Unit Number [id number]    Blood Component Type RBC, LR IRR    Unit division 00    Status of Unit ALLOCATED    Transfusion Status OK TO TRANSFUSE    Crossmatch Result Compatible   I-STAT 7, (LYTES, BLD GAS, ICA, H+H)     Status: Abnormal   Collection Time: 01/31/18  4:32 PM  Result Value Ref Range   pH, Arterial 7.211 (L) 7.350 - 7.450   pCO2 arterial 46.5 32.0 - 48.0 mmHg   pO2, Arterial 200.0 (H) 83.0 - 108.0 mmHg   Bicarbonate 18.7 (L) 20.0 - 28.0 mmol/L   TCO2 20 (L) 22 - 32 mmol/L   O2 Saturation 99.0 %   Acid-base deficit 9.0 (H) 0.0 - 2.0 mmol/L   Sodium 143 135 - 145 mmol/L   Potassium 3.7 3.5 - 5.1 mmol/L   Calcium, Ion 1.16 1.15 - 1.40 mmol/L   HCT 24.0 (L) 36.0 - 46.0 %   Hemoglobin 8.2 (L) 12.0 - 15.0 g/dL   Patient temperature 36.9 C    Sample type ARTERIAL   I-STAT 7, (LYTES, BLD GAS, ICA, H+H)     Status: Abnormal   Collection Time: 01/31/18  5:28 PM  Result Value Ref Range   pH, Arterial 7.219 (L) 7.350 - 7.450   pCO2 arterial 38.8 32.0 - 48.0 mmHg   pO2, Arterial 228.0 (H) 83.0 - 108.0 mmHg   Bicarbonate 15.9 (L) 20.0 - 28.0 mmol/L   TCO2 17 (L) 22 - 32 mmol/L   O2 Saturation 100.0 %   Acid-base deficit 11.0 (H) 0.0 - 2.0 mmol/L   Sodium 145 135 - 145 mmol/L   Potassium 3.6 3.5 - 5.1 mmol/L    Calcium, Ion 1.05 (L) 1.15 - 1.40 mmol/L   HCT 26.0 (L) 36.0 - 46.0 %   Hemoglobin 8.8 (L) 12.0 - 15.0 g/dL   Patient temperature 36.8 C    Sample type ARTERIAL   Troponin I     Status: Abnormal   Collection Time: 01/31/18  8:21 PM  Result Value Ref Range   Troponin I 0.03 (HH) <0.03 ng/mL  Triglycerides     Status: None   Collection Time: 01/31/18  8:21 PM  Result Value Ref Range   Triglycerides 117 <150 mg/dL  CBC     Status: Abnormal   Collection Time: 01/31/18  8:21 PM  Result Value Ref Range   WBC 1.4 (LL) 4.0 - 10.5 K/uL   RBC 4.73 3.87 - 5.11 MIL/uL   Hemoglobin 11.4 (L) 12.0 - 15.0 g/dL   HCT 34.5 (L) 36.0 - 46.0 %   MCV 72.9 (L) 78.0 - 100.0 fL   MCH 24.1 (L) 26.0 - 34.0 pg   MCHC 33.0 30.0 - 36.0 g/dL   RDW 18.9 (H) 11.5 - 15.5 %   Platelets 85 (L) 150 - 400 K/uL  Basic metabolic panel     Status: Abnormal   Collection Time: 01/31/18  8:21 PM  Result Value Ref Range   Sodium 143 135 - 145 mmol/L   Potassium 3.8 3.5 - 5.1 mmol/L   Chloride 115 (H) 98 -  111 mmol/L   CO2 20 (L) 22 - 32 mmol/L   Glucose, Bld 112 (H) 70 - 99 mg/dL   BUN 18 8 - 23 mg/dL   Creatinine, Ser 0.63 0.44 - 1.00 mg/dL   Calcium 7.8 (L) 8.9 - 10.3 mg/dL   GFR calc non Af Amer >60 >60 mL/min   GFR calc Af Amer >60 >60 mL/min   Anion gap 8 5 - 15  Blood gas, arterial     Status: Abnormal   Collection Time: 01/31/18  8:38 PM  Result Value Ref Range   FIO2 100.00    Delivery systems VENTILATOR    Mode PRESSURE REGULATED VOLUME CONTROL    VT 330 mL   LHR 24 resp/min   Peep/cpap 12.0 cm H20   pH, Arterial 7.159 (LL) 7.350 - 7.450   pCO2 arterial 50.0 (H) 32.0 - 48.0 mmHg   pO2, Arterial 63.0 (L) 83.0 - 108.0 mmHg   Bicarbonate 17.1 (L) 20.0 - 28.0 mmol/L   Acid-base deficit 11.1 (H) 0.0 - 2.0 mmol/L   O2 Saturation 83.8 %   Patient temperature 98.5    Collection site ARTERIAL LINE    Drawn by [id number]    Sample type ARTERIAL DRAW   Blood gas, arterial     Status: Abnormal   Collection  Time: 01/31/18 11:10 PM  Result Value Ref Range   FIO2 100.00    Delivery systems VENTILATOR    Mode PRESSURE REGULATED VOLUME CONTROL    VT 330 mL   LHR 33 resp/min   Peep/cpap 12.0 cm H20   pH, Arterial 7.242 (L) 7.350 - 7.450   pCO2 arterial 53.4 (H) 32.0 - 48.0 mmHg   pO2, Arterial 62.0 (L) 83.0 - 108.0 mmHg   Bicarbonate 22.2 20.0 - 28.0 mmol/L   Acid-base deficit 4.9 (H) 0.0 - 2.0 mmol/L   O2 Saturation 87.1 %   Patient temperature 98.5    Collection site ARTERIAL LINE    Drawn by [id number]    Sample type ARTERIAL DRAW   Glucose, capillary     Status: None   Collection Time: 01/31/18 11:41 PM  Result Value Ref Range   Glucose-Capillary 78 70 - 99 mg/dL   Comment 1 Notify RN   CBC     Status: Abnormal   Collection Time: 02/01/18  1:30 AM  Result Value Ref Range   WBC 1.6 (L) 4.0 - 10.5 K/uL   RBC 3.05 (L) 3.87 - 5.11 MIL/uL   Hemoglobin 7.4 (L) 12.0 - 15.0 g/dL   HCT 22.4 (L) 36.0 - 46.0 %   MCV 73.4 (L) 78.0 - 100.0 fL   MCH 24.3 (L) 26.0 - 34.0 pg   MCHC 33.0 30.0 - 36.0 g/dL   RDW 18.6 (H) 11.5 - 15.5 %   Platelets 55 (L) 150 - 400 K/uL  Lactic acid, plasma     Status: Abnormal   Collection Time: 02/01/18  1:30 AM  Result Value Ref Range   Lactic Acid, Venous 3.3 (HH) 0.5 - 1.9 mmol/L  Troponin I     Status: None   Collection Time: 02/01/18  1:30 AM  Result Value Ref Range   Troponin I <0.03 <0.03 ng/mL  Blood gas, arterial     Status: Abnormal   Collection Time: 02/01/18  4:32 AM  Result Value Ref Range   FIO2 100.00    Delivery systems VENTILATOR    Mode PRESSURE REGULATED VOLUME CONTROL    VT 330 mL   LHR 33  resp/min   Peep/cpap 12.0 cm H20   pH, Arterial 7.284 (L) 7.350 - 7.450   pCO2 arterial 57.6 (H) 32.0 - 48.0 mmHg   pO2, Arterial 81.0 (L) 83.0 - 108.0 mmHg   Bicarbonate 26.4 20.0 - 28.0 mmol/L   Acid-base deficit 0.4 0.0 - 2.0 mmol/L   O2 Saturation 94.8 %   Patient temperature 98.9    Collection site ARTERIAL LINE    Drawn by [id number]     Sample type ARTERIAL DRAW   Prepare RBC     Status: None   Collection Time: 02/01/18  7:43 AM  Result Value Ref Range   Order Confirmation      ORDER PROCESSED BY BLOOD BANK Performed at [facility], [place] [address], [place], [place] [address]   Culture, respiratory (non-expectorated)     Status: None (Preliminary result)   Collection Time: 02/01/18  8:11 AM  Result Value Ref Range   Specimen Description      TRACHEAL ASPIRATE Performed at [facility] [address] [place], [place] [address]    Special Requests      NONE Performed at [facility], [place] [address] [place], [place] [address]    Gram Stain      NO SQUAMOUS EPITHELIAL CELLS PRESENT ABUNDANT WBC PRESENT, PREDOMINANTLY PMN ABUNDANT YEAST FEW GRAM POSITIVE RODS Performed at [facility], [place] [address] [place], [place] [address]    Culture PENDING    Report Status PENDING   Troponin I     Status: Abnormal   Collection Time: 02/01/18  8:50 AM  Result Value Ref Range   Troponin I 0.04 (HH) <0.03 ng/mL  Prepare RBC     Status: None   Collection Time: 02/01/18  9:32 AM  Result Value Ref Range   Order Confirmation      ORDER PROCESSED BY BLOOD BANK Performed at [facility], [place] [address], [place], [place] [address]   Glucose, capillary     Status: Abnormal   Collection Time: 02/01/18 11:16 AM  Result Value Ref Range   Glucose-Capillary 54 (L) 70 - 99 mg/dL   Comment 1 Notify RN    Comment 2 Document in Chart   Blood gas, arterial     Status: Abnormal   Collection Time: 02/01/18 11:49 AM  Result Value Ref Range   FIO2 60.00    Delivery systems VENTILATOR    Mode PRESSURE REGULATED VOLUME CONTROL    VT 330 mL   LHR 36 resp/min   Peep/cpap 12.0 cm H20   pH, Arterial 7.293 (L) 7.350 - 7.450   pCO2 arterial 57.5 (H) 32.0 - 48.0 mmHg   pO2, Arterial 78.1 (L) 83.0 - 108.0 mmHg   Bicarbonate 26.8 20.0 - 28.0 mmol/L    Acid-Base Excess 0.1 0.0 - 2.0 mmol/L   O2 Saturation 93.5 %   Patient temperature 37.6    Collection site A-LINE    Drawn by [id number]    Sample type ARTERIAL DRAW   Glucose, capillary     Status: Abnormal   Collection Time: 02/01/18 12:19 PM  Result Value Ref Range   Glucose-Capillary 101 (H) 70 - 99 mg/dL   Comment 1 Notify RN    Comment 2 Document in Chart      Dg Chest 1 View  Result Date: 01/31/2018 CLINICAL DATA:  Intubated. EXAM: CHEST  1 VIEW COMPARISON:  01/31/2018 FINDINGS: Endotracheal tube is 3.7 cm above the carina. Right Port-A-Cath  is unchanged. Placement of NG tube into the stomach. Patchy bilateral airspace opacities, right greater than left have increased since prior study. Heart is normal size. No visible significant effusions or acute bony abnormality. IMPRESSION: Endotracheal tube 3.7 cm above the carina. Worsening patchy bilateral airspace disease, right greater than left concerning for infection/pneumonia. Electronically Signed   By: Rolm Baptise M.D.   On: 01/31/2018 19:11   Dg Abd 1 View  Result Date: 01/31/2018 CLINICAL DATA:  NG tube placement EXAM: ABDOMEN - 1 VIEW COMPARISON:  01/31/2018 FINDINGS: NG tube tip is in the distal stomach. Surgical drains project over the abdomen and pelvis. Gas within mildly prominent large bowel. No evidence of bowel obstruction. IMPRESSION: NG tube tip in the distal stomach. Electronically Signed   By: Rolm Baptise M.D.   On: 01/31/2018 19:11   Dg Abd 1 View  Result Date: 01/31/2018 CLINICAL DATA:  Confirm nasogastric tube placement EXAM: ABDOMEN - 1 VIEW COMPARISON:  Abdomen and pelvis CT from earlier today FINDINGS: Known large volume pneumoperitoneum. Dilated small bowel from ileus or ischemia. Nasogastric tube tip is at midline epigastrium. The side port is over the lower esophagus. These results were called by telephone at the time of interpretation on 01/31/2018 at 2:34 pm to Dr. Salvadore Dom , who verbally acknowledged these  results. Patient has been posted for the OR. Also in progress of calling findings surgeon. IMPRESSION: 1. Orogastric tube tip overlaps the epigastrium and may be at the proximal stomach. Advise against reposition given there is likely pneumatosis of the lower esophagus and proximal stomach. 2. Known large volume pneumoperitoneum and dilated small bowel. Electronically Signed   By: Monte Fantasia M.D.   On: 01/31/2018 14:36   Dg Abd 1 View  Result Date: 01/31/2018 CLINICAL DATA:  72 year old female with abdominal pain. EXAM: ABDOMEN - 1 VIEW COMPARISON:  CT of the abdomen pelvis dated 12/14/2017 FINDINGS: There is apparent air on both sides of the bowel loops (Rigler's sign) concerning for pneumoperitoneum. Clinical correlation and further evaluation with CT is recommended. There is dilatation of the small bowel measure up to 5 cm. The osseous structures are grossly unremarkable. There is atherosclerotic calcification of the aorta. Degenerative changes of the spine. IMPRESSION: Findings concerning for pneumoperitoneum, possibly secondary to small-bowel obstruction. Clinical correlation and further evaluation with CT of the abdomen and pelvis recommended. These results were called by telephone at the time of interpretation on 01/31/2018 at 4:46 am to nurse [place], who verbally acknowledged these results. Electronically Signed   By: Anner Crete M.D.   On: 01/31/2018 04:49   Ct Abdomen Pelvis W Contrast  Result Date: 01/31/2018 CLINICAL DATA:  72 year old female with abdominal pain. Pneumoperitoneum suspected on earlier radiograph. History of ovarian carcinoma. EXAM: CT ABDOMEN AND PELVIS WITH CONTRAST TECHNIQUE: Multidetector CT imaging of the abdomen and pelvis was performed using the standard protocol following bolus administration of intravenous contrast. CONTRAST:  133m OMNIPAQUE IOHEXOL 300 MG/ML  SOLN COMPARISON:  Abdominal radiograph dated 01/31/2018 and CT of the abdomen pelvis dated 12/14/2017  FINDINGS: Lower chest: Partially visualized small bilateral pleural effusions with associated compressive atelectasis of the adjacent lower lobes. There is mild cardiomegaly with coronary vascular calcification. There is a large pneumoperitoneum. There is moderate amount of ascites. There is enhancement of the peritoneal reflection most prominent in the pelvis suspicious for peritonitis. Hepatobiliary: The liver is unremarkable. No intrahepatic biliary ductal dilatation. The gallbladder is unremarkable. Pancreas: Unremarkable. No pancreatic ductal dilatation or surrounding inflammatory changes. Spleen:  Normal in size without focal abnormality. Adrenals/Urinary Tract: The adrenal glands are unremarkable. A 1.5 cm left renal parapelvic cyst. The kidneys are otherwise unremarkable. The urinary bladder is decompressed around a Foley catheter. Stomach/Bowel: There is sigmoid diverticulosis with muscular hypertrophy. There is intramural air in the visualized distal esophagus and gastroesophageal junction consistent with pneumatosis. In addition there is a mildly dilated loop of small bowel in the mid to lower abdomen which measures up to 3.6 cm in diameter. There are innumerable small pockets of air along the wall of this bowel suspicious for pneumatosis. The distal small bowel are collapsed. No discrete transition identified. There is a pocket of extraluminal air adjacent to the sigmoid colon. Vascular/Lymphatic: There is advanced aortoiliac atherosclerotic disease. 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 There is air in the porta splenic confluence as well as portal venous gas within the liver. Reproductive: Hysterectomy. Other: Diffuse subcutaneous edema. Midline vertical anterior abdominal wall incisional scar. There is extension of pelvic air into the left inguinal region. Musculoskeletal: No acute or significant osseous findings. IMPRESSION: 1. Findings most consistent with  ischemic bowel with portal venous gas and bowel pneumatosis and large amount of pneumoperitoneum. The site of perforation is not determined on this CT. However, there are multiple pockets of air adjacent to the sigmoid colon. 2. Enhancement of the peritoneum concerning for peritonitis. Clinical correlation and surgical consult is advised. 3.  Aortic Atherosclerosis (ICD10-I70.0). These results were called by telephone at the time of interpretation on 01/31/2018 at 6:05 am to nurse [place], who verbally acknowledged these results. Electronically Signed   By: Anner Crete M.D.   On: 01/31/2018 06:07   Dg Chest Port 1 View  Result Date: 01/31/2018 CLINICAL DATA:  Status post a line placement. EXAM: PORTABLE CHEST 1 VIEW COMPARISON:  Portable chest x-ray of January 31, 2018 at 3:18 a.m. FINDINGS: The lungs are slightly less well inflated today. There is persistent increased density at the left lung base. There is no pneumothorax nor large pleural effusion. The heart is mildly enlarged. The pulmonary vascularity is not clearly engorged. There is calcification in the wall of the aortic arch. The power port catheter tip projects over the distal third of the SVC. There is lucency under the hemidiaphragms slightly more conspicuous than on the earlier study. IMPRESSION: Decreased inflation of both lungs. Bibasilar atelectasis or developing pneumonia greatest on the left. Mild enlargement of the cardiac silhouette without definite pulmonary edema. The reservoir catheter tip projects over the distal third of the SVC. Increased conspicuity of lucency under the hemidiaphragms. This may reflect free extraluminal gas either postsurgical in nature or due to a perforated viscus. Correlation with the patient's postoperative course is needed. Abdominal and pelvic noncontrast CT scanning may be useful. Electronically Signed   By: David  Martinique M.D.   On: 01/31/2018 11:12   Dg Chest Port 1 View  Result Date: 01/31/2018 CLINICAL  DATA:  72 y/o  F; shortness of breath. EXAM: PORTABLE CHEST 1 VIEW COMPARISON:  01/30/2018 CT chest pain 01/29/2018 chest radiograph. FINDINGS: Stable cardiomegaly given projection and technique. Aortic atherosclerosis with calcification. Stable small bilateral effusions and bibasilar opacities compatible with atelectasis. No pleural effusion or pneumothorax. Diminished pneumoperitoneum. Right port catheter tip projects over the mid SVC. No acute osseous abnormality is evident. Loop recorder device noted. IMPRESSION: Stable small bilateral effusions and associated bibasilar atelectasis. Diminished pneumoperitoneum. Electronically Signed   By: Kristine Garbe M.D.   On:  01/31/2018 04:40   [name] Ekg Site Rite  Result Date: 01/31/2018 If Site Rite image not attached, placement could not be confirmed due to current cardiac rhythm.   ROS:  As stated above in the HPI otherwise negative.  Blood pressure (!) 98/55, pulse (!) 121, temperature 99 F (37.2 C), temperature source Oral, resp. rate (!) 36, height _0  (1.6 m), weight 67.4 kg, SpO2 94 %.    PE: Gen: Intubated, sedated Lungs: CTA Bilaterally CV: RRR ABM: Soft, NTND Ext: No C/C/E  Assessment/Plan: 1) Anemia. 2) Stage IV ovarian cancer. 3) Respiratory failure. 4) ? Bowel perforation.   The patient is in critical condition.  Her chart was extensively reviewed.  From the GI standpoint there does not appear to be an overt or significant GI bleed.  The NG aspirate is not consistent with a GI bleed, however, it will be prudent to stress prophylax with pantoprazole.  The anemia is most likely multifactorial.  Dr. Alvy Bimler feels that her acquired pancytopenia is from infection/sepsis.  Plan: 1) Continue supportive care. 2) No role for GI intervention at this time.  If there is an overt bleed, it is not clear if she will benefit with any heroic measures.   3) Continue with pantoprazole. 4) GI will follow through Epic and provide any  services if necessary.  Zylen Wenig D 02/01/2018, 3:01 PM

## 2018-02-01 NOTE — Progress Notes (Signed)
[facility] Progress Note Patient Name: Jo Parker DOB: Jan 02, 1946 MRN: [id number]   Date of Service  02/01/2018  HPI/Events of Note  Lactic Acid = 3.3 --> 2.4.   eICU Interventions  Continue present management.      Intervention Category Major Interventions: Acid-Base disturbance - evaluation and management  Jo Parker 02/01/2018, 9:09 PM

## 2018-02-01 NOTE — Progress Notes (Addendum)
Gynecologic Oncology  72 year old female diagnosed with Stage IV ovarian cancer who received four cycles of neoadjuvant chemotherapy with carboplatin and taxol with fourth cycle completed on 12/20/17 .  She underwent interval debulking surgery with Dr. Skeet Latch on 01/27/18 consisting of an exploratory laparotomy infra-gastric omentectomy bilateral salpingo-oophorectomy radical optimal tumor debulking, resection of anterior abdominal wall.  Her post-operative course was complicated by altered mental status noted on POD1 with improvement after Narcan administration but with return of symptoms, acute renal injury, tachycardia, dyspnea (CT chest performed 01/30/18-neg for PE), anemia.  Her AKI and altered mental status appeared to improve on 01/30/18 in the am but symptoms worsened the evening of 01/30/18 with episodes of emesis, tachycardia, hypertension, and confusion. A CT scan on 01/31/18 revealed findings most consistent with ischemic bowel with portal venous gas and bowel pneumatosis and large amount of pneumoperitoneum with concerns for peritonitis.  She was taken back to the OR on 01/31/18 with operative findings of no obvious bowel perforation or evidence of ischemia. It was felt that aspiration most likely occurred during intubation.        Subjective: Patient currently intubated.  Husband and son at the bedside. PICC line placed earlier this am.    Objective: Vital signs in last 24 hours: Temp:  [98.5 F (36.9 C)-102.3 F (39.1 C)] 100.7 F (38.2 C) (08/27 1015) Pulse Rate:  [107-128] 126 (08/27 0400) Resp:  [25-38] 33 (08/27 0400) BP: (86-154)/(50-75) 112/60 (08/27 0356) SpO2:  [88 %-98 %] 98 % (08/27 0803) Arterial Line BP: (94-158)/(52-68) 111/58 (08/27 0400) FiO2 (%):  [60 %-100 %] 60 % (08/27 0900) Weight:  [148 lb 9.4 oz (67.4 kg)] 148 lb 9.4 oz (67.4 kg) (08/27 0500) Last BM Date: 01/29/18  Intake/Output from previous day: 08/26 0701 - 08/27 0700 In: 6290.3 [I.V.:4303.9; Blood:412;  IV Piggyback:1574.4] Out: 2330 [Urine:1550; Emesis/NG output:450; Drains:230; Blood:100]  Physical Examination: General: no distress and intubated Resp: clear to auscultation bilaterally Cardio: tachycardic with HR at 126 bpm GI: incision: midline op site dressing with staples underneath, no active drainage noted and abdomen soft, slightly tympanic, bowel sounds hypoactive Extremities: extremities normal, atraumatic, no cyanosis or edema JP drain x3 in the abdomen charged with minimal serosanguinous drainage Foley with tea colored urine  Labs: WBC/Hgb/Hct/Plts:  1.6/7.4/22.4/55 (08/27 0130) BUN/Cr/glu/ALT/AST/amyl/lip:  18/0.63/--/--/--/--/-- (08/26 2021) Lactic acid 3.3  Assessment: 72 y.o. s/p Procedure(s): Interval debulking surgery with Dr. Skeet Latch on 01/27/18 consisting of an exploratory laparotomy infra-gastric omentectomy bilateral salpingo-oophorectomy radical optimal tumor debulking, resection of anterior abdominal wall.  Reoperation of EXPLORATORY LAPAROTOMY, ABDOMINAL WASHOUT on 01/31/18: critically ill  Heme: Post-operative anemia. Hgb 7.4 and Hct 22.4 this am. Transfusion ordered.  PLT 55.   ID: WBC 1.6 this am.  Granix ordered per Dr. Alvy Bimler.  On Vancomycin IV.  Anidulafungin ordered per Critical Care. Presumed sepsis from abdominal source but no evidence of perforation after exp lap on 01/31/18.   CV: Tachycardic.  Hypotensive.  On pressors per Critical Care. Troponin 0.04 at 08:50am.  Resp: ARDS in setting of aspiration pneumonia. On full vent support managed by Critical Care Team. Chest 1 view 01/31/18: Worsening patchy bilateral airspace disease, right greater than left concerning for infection/pneumonia.   GI:  Tolerating po: No: intubated. Protonix ordered. GI consultation initiated by Dr. Gerarda Fraction. CT AP on 01/31/18 with findings above.  No obvious perforation seen on re-operation on 01/31/18. NG with dark brown output.  GU: AKI post-op that has resolved. Creatinine 0.63  this am. 330 cc output  since 4 am. Foley in place.    FEN: Last Bmet yesterday at 20:21.  Continue to monitor.  Prophylaxis: SCDs in place.  Plan: >continue plan per Critical Care >likely not a candidate for GCSF at this time >recommend TPN; not a a candidate for GI feeds >continue to monitor JP output  I saw the patient.  The patient's plan was discussed with the GYN ONC team  LOS: 5 days    Dorothyann Gibbs 02/01/2018, 11:23 AM

## 2018-02-01 NOTE — Progress Notes (Signed)
[facility] Progress Note Patient Name: Jo Parker DOB: 07-Aug-1945 MRN: [id number]   Date of Service  02/01/2018  HPI/Events of Note  Lactic Acid = 3.3. CVP = 11.   eICU Interventions  Will bolus with 0.9 NaCl 1 liter IV over 1 hour now.      Intervention Category Major Interventions: Acid-Base disturbance - evaluation and management  Sherry Rogus Eugene 02/01/2018, 2:33 AM

## 2018-02-01 NOTE — Progress Notes (Signed)
At time of seeing patient this morning noted anemia and transfusion ordered. Dark NGT output and drop in H/H. Will consult GI for input on possible PPI drip and if any other testing recommended for etiology of free air on CTScan. Husband asked if the patient was "off the critical list". I explained she is still in critical condition and she is quite ill. He asked about extubation. I explained there are no plans for her to get extubated given her critical nature.

## 2018-02-01 NOTE — Progress Notes (Addendum)
Nutrition Follow-up  DOCUMENTATION CODES:   Not applicable  INTERVENTION:  - Will monitor for plan concerning nutrition support.  - If TF is feasible, recommend Vital AF 1.2 @ 20 mL/hr to advance by 10 mL every 8 hours to reach goal rate of 50 mL/hr. At goal rate, this regimen + kcal from current Propofol rate will provide 1672 kcal, 90 grams of protein, and 973 mL free water.  - Will monitor Propofol rate/changes.  Monitor magnesium, potassium, and phosphorus daily for at least 3 days following initiation of nutrition support, MD to replete as needed, as pt is at risk for refeeding syndrome given poor appetite/PO intakes since admission and for unknown amount of time PTA.   NUTRITION DIAGNOSIS:   Increased nutrient needs related to post-op healing as evidenced by estimated needs. -ongoing  GOAL:   Patient will meet greater than or equal to 90% of their needs -unmet  MONITOR:   Vent status, Weight trends, Labs, Skin, I & O's  REASON FOR ASSESSMENT:   Ventilator  ASSESSMENT:   Patient with PMH significant for COPD< HLD, HTN, mitral valve prolapse, and Stage IV ovarian cancer s/p neoadjuvant chemotherapy . Presents this admission for robotic assisted bilateral salpingo-oophorectomy omentectomy interval debulking.   Significant Events: 8/22: ex lap with infra-gastric omentectomy bilateral salpingo-oophorectomy radical optimal tumor debulking, resection of anterior abdominal wall 8/24: remained somnolent and was confused, noted to have post-op UTI 8/26: remained confused and having chest pain; transferred to SDU for Cardene drip; x-ray showed free air and CT abd concerning for ischemic bowel, bowel pneumatosis, and large amount of pneumoperitoneum; repeat ex lap with washout.   Estimated needs updated based on weight from yesterday prior to surgery (58.3 kg). Repeat ex lap done yesterday and notes state that there was no evidence of bowel perforation or ischemia, abd was closed.  Post-op CXR showed evidence of aspiration PNA. PICC placed this AM. OGT in place with 100 mL output overnight.   Per Pete's note this AM: severe sepsis, fever with neutropenia, acute hypoxic respiratory failure 2/2 aspiration PNA, plan for discussion with Surgery concerning ability to use GI tract for nutrition but may require TPN, resolved AKI with risk for recurrence, metabolic acidosis, lactic acidosis, respiratory acidosis.  Per Dr. Matilde Bash note this AM: ARDS in the setting of sepsis, plan for GI consult to evaluate for GIB d/t dark output from OGT.     Patient is currently intubated on ventilator support MV: 11.1 L/min Temp (24hrs), Avg:99.8 F (37.7 C), Min:98.5 F (36.9 C), Max:102.3 F (39.1 C) Propofol: 8.8 ml/hr (232 kcal) BP: 107/56 and MAP: 72  Medications reviewed; 20 mg IV Pepcid BID, 40 mg IV Lasix x1 dose yesterday, 1 amp sodium bicarb x1 yesterday and x1 today.  Labs reviewed; Cl: 115 mmol/L, Ca: 7.8 mg/dL, ionized Ca: 1.05 mmol/L. IVF; 150 mEq sodium bicarb in sterile water @ 125 mL/hr. Drips; Propofol @ 25 mcg/kg/min, Neo @ 100 mcg/min.   Diet Order:   Diet Order            Diet NPO time specified  Diet effective now              EDUCATION NEEDS:   Education needs have been addressed  Skin:  Skin Assessment: Skin Integrity Issues: Skin Integrity Issues:: Incisions Incisions: abdominal (8/26)  Last BM:  8/24  Height:   Ht Readings from Last 1 Encounters:  01/31/18 5\' 3"  (1.6 m)    Weight:   Wt Readings from Last  1 Encounters:  02/01/18 67.4 kg    Ideal Body Weight:  52.3 kg  BMI:  Body mass index is 26.32 kg/m.  Estimated Nutritional Needs:   Kcal:  2671  Protein:  87-99 grams (1.5-1.7 grams/kg)  Fluid:  >/= 1.5 L/day     Jarome Matin, MS, RD, LDN, [facility] Inpatient Clinical Dietitian Pager # [phone] After hours/weekend pager # [phone]

## 2018-02-01 NOTE — Progress Notes (Signed)
Went to see patient but patient having PICC line placed (sterile procedure).  Will round on patient later today.

## 2018-02-01 NOTE — Progress Notes (Signed)
Jo Parker   DOB:29-Aug-1945   SW#:[id number]    Assessment & Plan:  Ovarian cancer status post interval debulking surgery Continue aggressive supportive care.  Acquired pancytopenia Likely due to infection/sepsis I recommend G-CSF support to keep WBC greater than 3 or ANC greater than 1.5 We will add CBC with differential daily I recommend blood transfusion to keep hemoglobin greater than 8 She does not need platelet transfusion unless she have signs of bleeding or platelet count less than 20,000 Monitor carefully I agreed to discontinue aspirin therapy  Pneumonia, possible aspiration Continue broad-spectrum IV antibiotics Broad-spectrum IV antibiotics will likely cause for the pancytopenia.  Monitor carefully  Lactic acidosis/shock Currently on pressor support and aggressive fluid resuscitation  CODE STATUS Full code  Discharge planning She will likely remain in the ICU for several days I will continue to follow Family member agree with the plan of care  [name], MD 02/01/2018  8:01 AM   Subjective:  I was consulted by GYN oncologist service to follow on the patient with now pancytopenia, likely due to sepsis/pneumonia.  The patient is well-known to me.  She has completed neoadjuvant chemotherapy x4 cycles followed by interval debulking surgery.  Over the last few days, the patient has progressive decline.  Initially, CT imaging showed possible ischemic bowel.  Exploratory surgery yesterday did not confirm any signs of bowel perforation.  After surgery, she was noted to have hypoxia and chest x-ray show abnormalities suspicious for pneumonia.  The patient is currently unstable with tachycardia and hypotension with progressive pancytopenia.  She is also on pressor support. History is not possible as the patient is sedated and ventilated.  Her husband is present at the bedside.  Objective:  Vitals:   02/01/18 0356 02/01/18 0400  BP: 112/60   Pulse: (!) 126 (!) 126   Resp: (!) 33 (!) 33  Temp:    SpO2: 98% 98%     Intake/Output Summary (Last 24 hours) at 02/01/2018 0801 Last data filed at 02/01/2018 0500 Gross per 24 hour  Intake 6290.31 ml  Output 2330 ml  Net 3960.31 ml    GENERAL: She is intubated and sedated LUNGS: Reduced breath sound bilaterally HEART: She is tachycardic, no murmurs and no lower extremity edema ABDOMEN: Surgical scars noted NEURO: Unable to assess, sedated   Labs:  Lab Results  Component Value Date   WBC 1.6 (L) 02/01/2018   HGB 7.4 (L) 02/01/2018   HCT 22.4 (L) 02/01/2018   MCV 73.4 (L) 02/01/2018   PLT 55 (L) 02/01/2018   NEUTROABS 2.6 01/29/2018    Lab Results  Component Value Date   NA 143 01/31/2018   K 3.8 01/31/2018   CL 115 (H) 01/31/2018   CO2 20 (L) 01/31/2018    Studies:  Dg Chest 1 View  Result Date: 01/31/2018 CLINICAL DATA:  Intubated. EXAM: CHEST  1 VIEW COMPARISON:  01/31/2018 FINDINGS: Endotracheal tube is 3.7 cm above the carina. Right Port-A-Cath is unchanged. Placement of NG tube into the stomach. Patchy bilateral airspace opacities, right greater than left have increased since prior study. Heart is normal size. No visible significant effusions or acute bony abnormality. IMPRESSION: Endotracheal tube 3.7 cm above the carina. Worsening patchy bilateral airspace disease, right greater than left concerning for infection/pneumonia. Electronically Signed   By: Rolm Baptise M.D.   On: 01/31/2018 19:11   Dg Abd 1 View  Result Date: 01/31/2018 CLINICAL DATA:  NG tube placement EXAM: ABDOMEN - 1 VIEW COMPARISON:  01/31/2018 FINDINGS: NG  tube tip is in the distal stomach. Surgical drains project over the abdomen and pelvis. Gas within mildly prominent large bowel. No evidence of bowel obstruction. IMPRESSION: NG tube tip in the distal stomach. Electronically Signed   By: Rolm Baptise M.D.   On: 01/31/2018 19:11   Dg Abd 1 View  Result Date: 01/31/2018 CLINICAL DATA:  Confirm nasogastric tube  placement EXAM: ABDOMEN - 1 VIEW COMPARISON:  Abdomen and pelvis CT from earlier today FINDINGS: Known large volume pneumoperitoneum. Dilated small bowel from ileus or ischemia. Nasogastric tube tip is at midline epigastrium. The side port is over the lower esophagus. These results were called by telephone at the time of interpretation on 01/31/2018 at 2:34 pm to Dr. Salvadore Dom , who verbally acknowledged these results. Patient has been posted for the OR. Also in progress of calling findings surgeon. IMPRESSION: 1. Orogastric tube tip overlaps the epigastrium and may be at the proximal stomach. Advise against reposition given there is likely pneumatosis of the lower esophagus and proximal stomach. 2. Known large volume pneumoperitoneum and dilated small bowel. Electronically Signed   By: Monte Fantasia M.D.   On: 01/31/2018 14:36   Dg Abd 1 View  Result Date: 01/31/2018 CLINICAL DATA:  72 year old female with abdominal pain. EXAM: ABDOMEN - 1 VIEW COMPARISON:  CT of the abdomen pelvis dated 12/14/2017 FINDINGS: There is apparent air on both sides of the bowel loops (Rigler's sign) concerning for pneumoperitoneum. Clinical correlation and further evaluation with CT is recommended. There is dilatation of the small bowel measure up to 5 cm. The osseous structures are grossly unremarkable. There is atherosclerotic calcification of the aorta. Degenerative changes of the spine. IMPRESSION: Findings concerning for pneumoperitoneum, possibly secondary to small-bowel obstruction. Clinical correlation and further evaluation with CT of the abdomen and pelvis recommended. These results were called by telephone at the time of interpretation on 01/31/2018 at 4:46 am to nurse Powhatan, who verbally acknowledged these results. Electronically Signed   By: Anner Crete M.D.   On: 01/31/2018 04:49   Ct Abdomen Pelvis W Contrast  Result Date: 01/31/2018 CLINICAL DATA:  72 year old female with abdominal pain.  Pneumoperitoneum suspected on earlier radiograph. History of ovarian carcinoma. EXAM: CT ABDOMEN AND PELVIS WITH CONTRAST TECHNIQUE: Multidetector CT imaging of the abdomen and pelvis was performed using the standard protocol following bolus administration of intravenous contrast. CONTRAST:  137mL OMNIPAQUE IOHEXOL 300 MG/ML  SOLN COMPARISON:  Abdominal radiograph dated 01/31/2018 and CT of the abdomen pelvis dated 12/14/2017 FINDINGS: Lower chest: Partially visualized small bilateral pleural effusions with associated compressive atelectasis of the adjacent lower lobes. There is mild cardiomegaly with coronary vascular calcification. There is a large pneumoperitoneum. There is moderate amount of ascites. There is enhancement of the peritoneal reflection most prominent in the pelvis suspicious for peritonitis. Hepatobiliary: The liver is unremarkable. No intrahepatic biliary ductal dilatation. The gallbladder is unremarkable. Pancreas: Unremarkable. No pancreatic ductal dilatation or surrounding inflammatory changes. Spleen: Normal in size without focal abnormality. Adrenals/Urinary Tract: The adrenal glands are unremarkable. A 1.5 cm left renal parapelvic cyst. The kidneys are otherwise unremarkable. The urinary bladder is decompressed around a Foley catheter. Stomach/Bowel: There is sigmoid diverticulosis with muscular hypertrophy. There is intramural air in the visualized distal esophagus and gastroesophageal junction consistent with pneumatosis. In addition there is a mildly dilated loop of small bowel in the mid to lower abdomen which measures up to 3.6 cm in diameter. There are innumerable small pockets of air along the wall of this  bowel suspicious for pneumatosis. The distal small bowel are collapsed. No discrete transition identified. There is a pocket of extraluminal air adjacent to the sigmoid colon. Vascular/Lymphatic: There is advanced aortoiliac atherosclerotic disease. 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 There is air in the porta splenic confluence as well as portal venous gas within the liver. Reproductive: Hysterectomy. Other: Diffuse subcutaneous edema. Midline vertical anterior abdominal wall incisional scar. There is extension of pelvic air into the left inguinal region. Musculoskeletal: No acute or significant osseous findings. IMPRESSION: 1. Findings most consistent with ischemic bowel with portal venous gas and bowel pneumatosis and large amount of pneumoperitoneum. The site of perforation is not determined on this CT. However, there are multiple pockets of air adjacent to the sigmoid colon. 2. Enhancement of the peritoneum concerning for peritonitis. Clinical correlation and surgical consult is advised. 3.  Aortic Atherosclerosis (ICD10-I70.0). These results were called by telephone at the time of interpretation on 01/31/2018 at 6:05 am to nurse Powhatan, who verbally acknowledged these results. Electronically Signed   By: Anner Crete M.D.   On: 01/31/2018 06:07   Dg Chest Port 1 View  Result Date: 01/31/2018 CLINICAL DATA:  Status post a line placement. EXAM: PORTABLE CHEST 1 VIEW COMPARISON:  Portable chest x-ray of January 31, 2018 at 3:18 a.m. FINDINGS: The lungs are slightly less well inflated today. There is persistent increased density at the left lung base. There is no pneumothorax nor large pleural effusion. The heart is mildly enlarged. The pulmonary vascularity is not clearly engorged. There is calcification in the wall of the aortic arch. The power port catheter tip projects over the distal third of the SVC. There is lucency under the hemidiaphragms slightly more conspicuous than on the earlier study. IMPRESSION: Decreased inflation of both lungs. Bibasilar atelectasis or developing pneumonia greatest on the left. Mild enlargement of the cardiac silhouette without definite pulmonary edema. The reservoir catheter tip projects over the distal third  of the SVC. Increased conspicuity of lucency under the hemidiaphragms. This may reflect free extraluminal gas either postsurgical in nature or due to a perforated viscus. Correlation with the patient's postoperative course is needed. Abdominal and pelvic noncontrast CT scanning may be useful. Electronically Signed   By: David  Martinique M.D.   On: 01/31/2018 11:12   Dg Chest Port 1 View  Result Date: 01/31/2018 CLINICAL DATA:  72 y/o  F; shortness of breath. EXAM: PORTABLE CHEST 1 VIEW COMPARISON:  01/30/2018 CT chest pain 01/29/2018 chest radiograph. FINDINGS: Stable cardiomegaly given projection and technique. Aortic atherosclerosis with calcification. Stable small bilateral effusions and bibasilar opacities compatible with atelectasis. No pleural effusion or pneumothorax. Diminished pneumoperitoneum. Right port catheter tip projects over the mid SVC. No acute osseous abnormality is evident. Loop recorder device noted. IMPRESSION: Stable small bilateral effusions and associated bibasilar atelectasis. Diminished pneumoperitoneum. Electronically Signed   By: Kristine Garbe M.D.   On: 01/31/2018 04:40   [name] Ekg Site Rite  Result Date: 01/31/2018 If Site Rite image not attached, placement could not be confirmed due to current cardiac rhythm.

## 2018-02-02 ENCOUNTER — Inpatient Hospital Stay (HOSPITAL_COMMUNITY): Payer: Medicare PPO

## 2018-02-02 ENCOUNTER — Encounter: Payer: Self-pay | Admitting: Oncology

## 2018-02-02 LAB — BASIC METABOLIC PANEL WITH GFR
Anion gap: 8 (ref 5–15)
BUN: 20 mg/dL (ref 8–23)
CO2: 25 mmol/L (ref 22–32)
Calcium: 6.1 mg/dL — CL (ref 8.9–10.3)
Chloride: 115 mmol/L — ABNORMAL HIGH (ref 98–111)
Creatinine, Ser: 0.63 mg/dL (ref 0.44–1.00)
GFR calc Af Amer: >60 mL/min
GFR calc non Af Amer: >60 mL/min
Glucose, Bld: 71 mg/dL (ref 70–99)
Potassium: 2.9 mmol/L — ABNORMAL LOW (ref 3.5–5.1)
Sodium: 148 mmol/L — ABNORMAL HIGH (ref 135–145)

## 2018-02-02 LAB — GLUCOSE, CAPILLARY
Glucose-Capillary: 92 mg/dL (ref 70–99)
Glucose-Capillary: 90 mg/dL (ref 70–99)
Glucose-Capillary: 73 mg/dL (ref 70–99)
Glucose-Capillary: 132 mg/dL — ABNORMAL HIGH (ref 70–99)

## 2018-02-02 LAB — CBC WITH DIFFERENTIAL/PLATELET
Basophils Absolute: 0.1 10*3/uL (ref 0.0–0.1)
Basophils Relative: 0 %
Eosinophils Absolute: 0.2 10*3/uL (ref 0.0–0.7)
Eosinophils Relative: 1 %
HCT: 35 % — ABNORMAL LOW (ref 36.0–46.0)
Hemoglobin: 11.9 g/dL — ABNORMAL LOW (ref 12.0–15.0)
Lymphocytes Relative: 4 %
Lymphs Abs: 1 10*3/uL (ref 0.7–4.0)
MCH: 26.1 pg (ref 26.0–34.0)
MCHC: 34 g/dL (ref 30.0–36.0)
MCV: 76.8 fL — ABNORMAL LOW (ref 78.0–100.0)
Monocytes Absolute: 0.5 10*3/uL (ref 0.1–1.0)
Monocytes Relative: 2 %
Neutro Abs: 26.2 10*3/uL (ref 1.7–7.7)
Neutrophils Relative %: 93 %
Platelets: 62 10*3/uL — ABNORMAL LOW (ref 150–400)
RBC: 4.56 MIL/uL (ref 3.87–5.11)
RDW: 19.4 % — ABNORMAL HIGH (ref 11.5–15.5)
WBC: 27.9 10*3/uL — ABNORMAL HIGH (ref 4.0–10.5)

## 2018-02-02 LAB — COMPREHENSIVE METABOLIC PANEL
ALBUMIN: 1.8 g/dL — AB (ref 3.5–5.0)
ALT: 18 U/L (ref 0–44)
ANION GAP: 10 (ref 5–15)
AST: 32 U/L (ref 15–41)
Alkaline Phosphatase: 102 U/L (ref 38–126)
BILIRUBIN TOTAL: 0.9 mg/dL (ref 0.3–1.2)
BUN: 26 mg/dL — AB (ref 8–23)
CHLORIDE: 106 mmol/L (ref 98–111)
CO2: 26 mmol/L (ref 22–32)
Calcium: 6.8 mg/dL — ABNORMAL LOW (ref 8.9–10.3)
Creatinine, Ser: 0.96 mg/dL (ref 0.44–1.00)
GFR calc Af Amer: >60 mL/min (ref >60)
GFR calc non Af Amer: 58 mL/min — ABNORMAL LOW (ref >60)
GLUCOSE: 268 mg/dL — AB (ref 70–99)
POTASSIUM: 3.4 mmol/L — AB (ref 3.5–5.1)
SODIUM: 142 mmol/L (ref 135–145)
Total Protein: 4.2 g/dL — ABNORMAL LOW (ref 6.5–8.1)

## 2018-02-02 LAB — BLOOD GAS, ARTERIAL
ACID-BASE EXCESS: 1.5 mmol/L (ref 0.0–2.0)
Bicarbonate: 25.7 mmol/L (ref 20.0–28.0)
Drawn by: 225631
FIO2: 50
MECHVT: 0.33 mL
O2 Saturation: 97.1 %
PATIENT TEMPERATURE: 97.9
PEEP: 10 cmH2O
PH ART: 7.417 (ref 7.350–7.450)
PO2 ART: 88.8 mmHg (ref 83.0–108.0)
RATE: 36 resp/min
pCO2 arterial: 40.4 mmHg (ref 32.0–48.0)

## 2018-02-02 MED ORDER — FUROSEMIDE 10 MG/ML IJ SOLN
40.0000 mg | Freq: Once | INTRAMUSCULAR | Status: AC
  Administered 2018-02-02: 40 mg via INTRAVENOUS
  Filled 2018-02-02: qty 4

## 2018-02-02 MED ORDER — DEXMEDETOMIDINE HCL IN NACL 400 MCG/100ML IV SOLN
0.0000 ug/kg/h | INTRAVENOUS | Status: AC
  Administered 2018-02-02 – 2018-02-04 (×8): 1.2 ug/kg/h via INTRAVENOUS
  Administered 2018-02-04: 1.1 ug/kg/h via INTRAVENOUS
  Administered 2018-02-04 – 2018-02-05 (×4): 1.2 ug/kg/h via INTRAVENOUS
  Administered 2018-02-05: 0.8 ug/kg/h via INTRAVENOUS
  Filled 2018-02-02 (×15): qty 100

## 2018-02-02 MED ORDER — POTASSIUM CHLORIDE 10 MEQ/100ML IV SOLN
10.0000 meq | INTRAVENOUS | Status: AC
  Administered 2018-02-02 (×4): 10 meq via INTRAVENOUS
  Filled 2018-02-02 (×4): qty 100

## 2018-02-02 MED ORDER — POTASSIUM CHLORIDE 20 MEQ/15ML (10%) PO SOLN: 20.0000 meq | ORAL | Status: DC

## 2018-02-02 MED ORDER — DEXMEDETOMIDINE HCL IN NACL 200 MCG/50ML IV SOLN
0.0000 ug/kg/h | INTRAVENOUS | Status: DC
  Administered 2018-02-02: 0.5 ug/kg/h via INTRAVENOUS
  Administered 2018-02-02: 0.6 ug/kg/h via INTRAVENOUS
  Administered 2018-02-02: 0.4 ug/kg/h via INTRAVENOUS
  Filled 2018-02-02 (×4): qty 50

## 2018-02-02 MED ORDER — FENTANYL CITRATE (PF) 100 MCG/2ML IJ SOLN
50.0000 ug | INTRAMUSCULAR | Status: DC | PRN
  Administered 2018-02-02 (×2): 50 ug via INTRAVENOUS
  Filled 2018-02-02 (×4): qty 2

## 2018-02-02 MED ORDER — TRACE MINERALS CR-CU-MN-SE-ZN 10-1000-500-60 MCG/ML IV SOLN
INTRAVENOUS | Status: AC
  Administered 2018-02-02: 18:00:00 via INTRAVENOUS
  Filled 2018-02-02 (×2): qty 960

## 2018-02-02 MED ORDER — FENTANYL CITRATE (PF) 100 MCG/2ML IJ SOLN
INTRAMUSCULAR | Status: AC
  Filled 2018-02-02: qty 2

## 2018-02-02 MED ORDER — POTASSIUM CHLORIDE 10 MEQ/100ML IV SOLN: 10.0000 meq | INTRAVENOUS | Status: DC

## 2018-02-02 MED ORDER — SODIUM CHLORIDE 0.9 % IV SOLN
1.0000 g | Freq: Once | INTRAVENOUS | Status: AC
  Administered 2018-02-02: 1 g via INTRAVENOUS
  Filled 2018-02-02: qty 10

## 2018-02-02 MED ORDER — METOPROLOL TARTRATE 5 MG/5ML IV SOLN
2.5000 mg | Freq: Four times a day (QID) | INTRAVENOUS | Status: DC
  Administered 2018-02-02 – 2018-02-03 (×4): 2.5 mg via INTRAVENOUS
  Filled 2018-02-02 (×4): qty 5

## 2018-02-02 MED ORDER — POTASSIUM CHLORIDE 10 MEQ/100ML IV SOLN
10.0000 meq | INTRAVENOUS | Status: AC
  Administered 2018-02-02 – 2018-02-03 (×6): 10 meq via INTRAVENOUS
  Filled 2018-02-02 (×6): qty 100

## 2018-02-02 MED ORDER — INSULIN ASPART 100 UNIT/ML ~~LOC~~ SOLN
0.0000 [IU] | SUBCUTANEOUS | Status: DC
  Administered 2018-02-02: 3 [IU] via SUBCUTANEOUS
  Administered 2018-02-03: 4 [IU] via SUBCUTANEOUS
  Administered 2018-02-03: 3 [IU] via SUBCUTANEOUS
  Administered 2018-02-03: 7 [IU] via SUBCUTANEOUS
  Administered 2018-02-03 (×4): 4 [IU] via SUBCUTANEOUS
  Administered 2018-02-04: 3 [IU] via SUBCUTANEOUS
  Administered 2018-02-04: 7 [IU] via SUBCUTANEOUS
  Administered 2018-02-04: 4 [IU] via SUBCUTANEOUS
  Administered 2018-02-04: 7 [IU] via SUBCUTANEOUS
  Administered 2018-02-05 – 2018-02-06 (×7): 3 [IU] via SUBCUTANEOUS
  Administered 2018-02-06: 4 [IU] via SUBCUTANEOUS
  Administered 2018-02-06: 7 [IU] via SUBCUTANEOUS
  Administered 2018-02-06: 4 [IU] via SUBCUTANEOUS
  Administered 2018-02-06 – 2018-02-07 (×3): 3 [IU] via SUBCUTANEOUS
  Administered 2018-02-07: 4 [IU] via SUBCUTANEOUS
  Administered 2018-02-07 – 2018-02-08 (×7): 3 [IU] via SUBCUTANEOUS
  Administered 2018-02-09: 4 [IU] via SUBCUTANEOUS
  Administered 2018-02-09: 3 [IU] via SUBCUTANEOUS
  Administered 2018-02-09 (×2): 4 [IU] via SUBCUTANEOUS
  Administered 2018-02-09 (×2): 3 [IU] via SUBCUTANEOUS
  Administered 2018-02-09 – 2018-02-10 (×2): 4 [IU] via SUBCUTANEOUS
  Administered 2018-02-10: 3 [IU] via SUBCUTANEOUS
  Administered 2018-02-10: 4 [IU] via SUBCUTANEOUS
  Administered 2018-02-10: 1 [IU] via SUBCUTANEOUS
  Administered 2018-02-11 – 2018-02-12 (×3): 3 [IU] via SUBCUTANEOUS
  Administered 2018-02-12: 4 [IU] via SUBCUTANEOUS
  Administered 2018-02-12 – 2018-02-13 (×3): 3 [IU] via SUBCUTANEOUS
  Administered 2018-02-15: 4 [IU] via SUBCUTANEOUS
  Administered 2018-02-16: 3 [IU] via SUBCUTANEOUS
  Administered 2018-02-16: 4 [IU] via SUBCUTANEOUS
  Administered 2018-02-16 – 2018-02-17 (×2): 3 [IU] via SUBCUTANEOUS
  Administered 2018-02-17 – 2018-02-18 (×2): 4 [IU] via SUBCUTANEOUS
  Administered 2018-02-18 – 2018-02-21 (×10): 3 [IU] via SUBCUTANEOUS
  Administered 2018-02-21: 4 [IU] via SUBCUTANEOUS
  Administered 2018-02-21: 3 [IU] via SUBCUTANEOUS
  Administered 2018-02-21: 4 [IU] via SUBCUTANEOUS
  Administered 2018-02-22 – 2018-02-25 (×4): 3 [IU] via SUBCUTANEOUS
  Administered 2018-02-25: 4 [IU] via SUBCUTANEOUS
  Administered 2018-02-25 (×2): 3 [IU] via SUBCUTANEOUS
  Administered 2018-02-25: 4 [IU] via SUBCUTANEOUS
  Administered 2018-02-26 (×2): 3 [IU] via SUBCUTANEOUS
  Administered 2018-02-26 (×4): 4 [IU] via SUBCUTANEOUS
  Administered 2018-02-27 – 2018-03-01 (×12): 3 [IU] via SUBCUTANEOUS
  Administered 2018-03-01 (×2): 4 [IU] via SUBCUTANEOUS
  Administered 2018-03-02 (×4): 3 [IU] via SUBCUTANEOUS
  Administered 2018-03-02: 4 [IU] via SUBCUTANEOUS
  Administered 2018-03-03 – 2018-03-04 (×5): 3 [IU] via SUBCUTANEOUS

## 2018-02-02 MED ORDER — CHLORHEXIDINE GLUCONATE 0.12 % MT SOLN: 15.0000 mL | OROMUCOSAL | Status: DC

## 2018-02-02 MED ORDER — FENTANYL CITRATE (PF) 100 MCG/2ML IJ SOLN
50.0000 ug | INTRAMUSCULAR | Status: DC | PRN
  Administered 2018-02-02 (×4): 50 ug via INTRAVENOUS
  Filled 2018-02-02 (×3): qty 2

## 2018-02-02 MED ORDER — CHLORHEXIDINE GLUCONATE 0.12% ORAL RINSE (MEDLINE KIT)
15.0000 mL | Freq: Two times a day (BID) | OROMUCOSAL | Status: DC
  Administered 2018-02-02 – 2018-02-28 (×51): 15 mL via OROMUCOSAL

## 2018-02-02 MED ORDER — FENTANYL CITRATE (PF) 100 MCG/2ML IJ SOLN
50.0000 ug | INTRAMUSCULAR | Status: DC | PRN
  Administered 2018-02-02 – 2018-02-04 (×12): 100 ug via INTRAVENOUS
  Administered 2018-02-04 (×2): 50 ug via INTRAVENOUS
  Administered 2018-02-04 (×2): 100 ug via INTRAVENOUS
  Administered 2018-02-04 (×2): 50 ug via INTRAVENOUS
  Administered 2018-02-04: 100 ug via INTRAVENOUS
  Filled 2018-02-02 (×18): qty 2

## 2018-02-02 MED ORDER — ORAL CARE MOUTH RINSE
15.0000 mL | OROMUCOSAL | Status: DC
  Administered 2018-02-02 – 2018-02-28 (×241): 15 mL via OROMUCOSAL

## 2018-02-02 NOTE — Progress Notes (Signed)
Hypoglycemic Event   CBG: 54   Treatment: D50 IV 50 mL  Symptoms: None  Follow-up CBG: Time:1219 CBG Result: 101  Possible Reasons for Event: Unknown  Comments/MD notified:YES    Jo Parker [employer]

## 2018-02-02 NOTE — Progress Notes (Signed)
RN was called at 1615 about critical Ca2+ results of 6.1. Ca Gluconate that was due at 1400, was only available at 1630 from pharmacy. Administered at 1630. Will follow up Ca2+ levels with next BMP. Charge RN updated.

## 2018-02-02 NOTE — Progress Notes (Signed)
IV Fentanyl 160 ml wasted in the sink. Witnessed by [employer]

## 2018-02-02 NOTE — Care Management Note (Signed)
Case Management Note  Patient Details  Name: Jo Parker MRN: [id number] Date of Birth: 10-29-1945  Subjective/Objective:                  POD 5 status post exploratory laparotomy bilateral salpingo-oophorectomy omentectomy optimal debulking.  Postop course complicated by acute kidney the injury with altered mental status.  Imaging consisting of a CT of abdomen and pelvis was worrisome for ischemic bowel with portal venous gas bowel pneumatosis and pneumoperitoneum.  She underwent an exploratory laparotomy 24 hours.  Intraoperative exploration did not identify ischemic bowel or a source of perforation.  The abdomen was irrigated, drains placed.  Postoperatively she remains intubated and continues to require pharmacologic pressure support.  Action/Plan:  Discharge readiness is indicated by patient meeting Recovery Milestones, including ALL of the following: ? Hemodynamic stability/ a.line in place-132/68,temp-100.7 ON full vent support./ iv levophed  ? Stable Hgb/Hct 11.9/35.0 ? No evidence of postoperative or surgical site infection site clean and dry/ wcb 27.9 ? Pain absent or managed ? Ambulatory[Q] bed rest due to vent and sedation ? Oral hydration, medications, and diet-Iv sedation, iv lr at 125cc/hrs,Iv merrem, iv vancomycin ?    Expected Discharge Date:           Expected Discharge Plan:  Home/Self Care  In-House Referral:     Discharge planning Services  CM Consult  Post Acute Care Choice:    Choice offered to:     DME Arranged:    DME Agency:     HH Arranged:    HH Agency:     Status of Service:  In process, will continue to follow  If discussed at Long Length of Stay Meetings, dates discussed:    Additional Comments:  Leeroy Cha, RN 02/02/2018, 8:55 AM

## 2018-02-02 NOTE — Progress Notes (Deleted)
Fentanyl 160 ml wasted in the sink

## 2018-02-02 NOTE — Progress Notes (Signed)
[facility] ADULT ICU REPLACEMENT PROTOCOL FOR AM LAB REPLACEMENT ONLY  The patient does apply for the [facility] Adult ICU Electrolyte Replacment Protocol based on the criteria listed below:   1. Is GFR >/= 40 ml/min? Yes.    Patient's GFR today is  >60 2. Is urine output >/= 0.5 ml/kg/hr for the last 6 hours? Yes.   Patient's UOP is 0.58 ml/kg/hr 3. Is BUN < 60 mg/dL? Yes.    Patient's BUN today is 26 4. Abnormal electrolyte(s): K= 3.4 5. Ordered repletion with: per protocol 6. If a panic level lab has been reported, has the CCM MD in charge been notified? Yes.  .   Physician:  Dr. Fidel Levy, Zahirah Cheslock [place]ines 02/02/2018 6:31 AM

## 2018-02-02 NOTE — Progress Notes (Signed)
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  She is completed 3 cycles of chemotherapy and is now status post bilateral salpingo-oophorectomy and omentectomy with debulking on 8/22. 8/23: Difficult with analgesic control.  This was complicated by patient becoming somnolent and hypotensive after adjustment of narcotics, treated with Saline and adjustment of medications. 8/24: Still somnolent, noted to be confused.  Is fused 1 unit for hemoglobin of 6.7.  Internal medicine consulted for acute encephalopathy also postoperative acute kidney injury.  8/25: Remains encephalopathic.  Hemoglobin improved posttransfusion.  Progress note commenting on dyspnea. Had episode of vomiting shortly after arriving in [place] unit.  More hypertensive and tachycardic. 8/26:hypertensive having some chest pain but remained confused troponins were negative.  Again vomiting.  Transferred to stepdown unit for Cardene drip. Diagnostic imaging raising concern for perforation w/ free air noted.  Imipenem started.  Went for exploratory laparotomy with washout.  No significant contamination identified, no obvious source of perforation.  Returned with abdominal drains postoperative chest x-ray showing evidence of aspiration pneumonia; mixed respiratory and metabolic acidosis.   8/27: Hypotensive, chest x-ray with significant right greater than left airspace disease.  Remains on low-dose pressors, still sedated, hemoglobin dropped from 8.8 to 7.4; got 2 units of blood,  febrile, adding vancomycin.  Neupogen given for neutropenia. 8/28: Pressors off/shock resolved. NAG acidosis and Lactic acidosis resolved.  FiO2/PEEP down, attempting pressure support ventilation.  Blood cell count  up.  Chest x-ray still with significant right greater than left airspace disease.  Discontinuing sedating medications.  Starting TPN  Assessment & Plan:   Severe sepsis, septic shock fever  with neutropenia: peritonitis (microperforation), complicated by aspiration pneumonia.  -off levophed Plan Day #2 vancomycin and Eraxis day #3 meropenem Cont tele  Dc pressors   Status post exploratory laparotomy for free air on 8/26 ; following bilateral salpingo-oophorectomy, omentectomy, and debulking surgery on 8/22 Plan Cont NPO status Routine dressing care and drain management  Start TPN  Acute hypoxic respiratory failure in the setting of aspiration pneumonia Portable chest x-ray personally reviewed on 8/28: This demonstrates somewhat worsening right greater than left airspace disease endotracheal tube and port are in satisfactory position -We did successfully wean this morning until dressing change Plan Changing RASS goal to 0 to -1 Continue to wean PEEP/FiO2 with daily spontaneous breathing trials Lasix x1 VAP interventions Changing resting respiratory rate, would like her to set her own minute ventilation Will follow-up a.m. chest x-ray  Sinus tachycardia, grade 1 diastolic dysfunction with minimal troponin I bump.  Echo showed hyperdynamic ejection fraction with no wall motion abnormality Plan Continue monitoring Adding low-dose beta-blockade  Acute metabolic encephalopathy.  Suspect this is multifactorial and secondary to sepsis as well as residual narcotics and metabolic acidosis Plan PAD protocol, RASS goal 0 to -1  At risk for recurrent AKI.  Creatinine bumped yesterday, has now improved once again Plan To new ensure adequate mean arterial pressure Renal dose medications Strict intake output  At risk for fluid and electrolyte imbalance: Hypokalemia acidosis is now resolved. Plan KVO IV fluids Follow-up a.m. Chemistry Replace potassium  Postoperative anemia.  Does  have coffee ground gastric tube output  Hemoglobin stable status post transfusion Plan Trend CBC Cont PPI Holding anticoagulation We will discuss  with surgical services, likely can start back low molecular weight heparin 8/29 if platelets continue to improve  Leukocytosis.  Initially was neutropenic however did receive Granix on 8/28 per oncology Plan Trend CBC  thrombocytopenia Plan Trend CBC Hold anticoagulation  Protein calorie malnutrition in the setting of cancer Plan Start TPN  Hyperglycemia Plan Initiate sliding scale insulin  Stage IV ovarian cancer, now status post bilateral salpingo-oophorectomy omentectomy and debulking surgery on 8/22 Plan Per GYN/ONC  Best practice/Goals of care/disposition.   DVT prophylaxis: LMWH-->SCD (8/26) GI prophylaxis: NA-->H2B, changed to PPI on 8/27 Diet: NPO, starting TPN on 8/28 Mobility:BR Code Status: Full Code Family Communication: husband and son updated   Disposition / Summary of Today's Plan 02/02/18   Remains quite sick, however clinically she is improved a little compared to yesterday.  The ongoing working diagnosis is severe sepsis secondary to aspiration pneumonia, and microperforation with peritonitis.  She is now off pressors.  Now weaning.  For today the goal will be to lighten sedation regimen shooting for a RASS of 0 to -1, diuresis, continue antimicrobial coverage, and start TPN.  I doubt she will be ready for extubation today, however we are trying to set the runway for this.  She still has a significant road ahead of her   Consultants:  Internal medicine 8/25 Critical care 8/26 General surgery 8/26 GI medicine 8/27: They did not feel she had overt GI bleed.  Recommended prophylaxis with PPI.  Signed off. Procedures: bilateral salpingo-nephrectomy omentectomy and debulking surgery on 8/22 8/26: Intubation 8/26: Exploratory laparotomy, washout, placement of right upper quadrant x2 JP drain and left lower  quadrant JP drain  Significant Diagnostic Tests: CT abdomen 8/26: Hemic bowel with portal venous gas and bowel pneumatosis with large amount of pneumoperitoneum.  Unclear as to site of perforation.  Multiple pockets of air adjacent to the sigmoid colon.  Enhancement of the peritoneum concerning for peritonitis.  There was aortic arterial sclerosis. Echocardiogram 8/27: Particular ejection fraction 65 to 70%.  She has moderate LVH, and hyperdynamic LV function.  There is grade 1 diastolic dysfunction.  Trivial MR.  Normal left atrial size.  The IV C was not visualized  Micro Data: Blood Culture times two 8/26>>> Sputum 8/27: Abundant yeast, few gram-positive rods>>>  Antimicrobials:  Meropenem 8/26>>> Vancomycin 8/27>>> Eraxis 8/27>>> Subjective  Unable to assess pain.  Appears comfortable on pressure support initially  Objective    Examination: General: 72 year old female currently sedated on the ventilator.  She is more awake and interactive today but not purposeful yet. HEENT normocephalic atraumatic does have temporal wasting.  She is orally intubated. Pulmonary: Coarse breath sounds equal chest rise on the ventilator no accessory use tolerating short round of pressure support ventilation Cardiac: Tachycardic regular rate and rhythm Abdomen: JP needs unremarkable.  Abdominal dressing unremarkable.  Hypoactive bowel sounds. GU: Clear yellow Neuro: Eyes are open, nonpurposeful, moves all extremities. Extremities: Diffuse dependent edema   Blood pressure (Abnormal) 145/71, pulse (Abnormal) 121, temperature 99.8 F (37.7 C), temperature source Oral, resp. rate (Abnormal) 36, height 5\' 3"  (1.6 m), weight 71.6 kg, SpO2 95 %. CVP:  [6 mmHg-28 mmHg] 7 mmHg  Vent Mode: PRVC FiO2 (%):  [50 %-60 %] 50 % Set Rate:  [36 bmp] 36 bmp Vt Set:  [36 mL-330 mL] 330 mL PEEP:  [10 cmH20-12 cmH20] 10 cmH20 Plateau Pressure:  [19 cmH20-25 cmH20] 19 cmH20   Intake/Output Summary (Last 24 hours) at  02/02/2018 0843 Last data filed at 02/02/2018 0840  Gross per 24 hour  Intake 4933.74 ml  Output 1590 ml  Net 3343.74 ml   Filed Weights   01/31/18 0412 02/01/18 0500 02/02/18 0500  Weight: 58.3 kg 67.4 kg 71.6 kg     Labs   CBC: Recent Labs  Lab 01/26/18 1457  01/29/18 2113  01/31/18 0309 01/31/18 0850 01/31/18 1632 01/31/18 1728 01/31/18 2021 02/01/18 0130 02/02/18 0455  WBC 4.1   < > 3.5*   < > 3.1* 3.7*  --   --  1.4* 1.6* 27.9*  NEUTROABS 1.5  --  2.6  --   --   --   --   --   --   --  26.2  HGB 10.5*   < > 8.6*   < > 10.8* 10.1* 8.2* 8.8* 11.4* 7.4* 11.9*  HCT 33.6*   < > 27.1*   < > 32.2* 30.3* 24.0* 26.0* 34.5* 22.4* 35.0*  MCV 69.6*   < > 73.4*   < > 70.8* 71.1*  --   --  72.9* 73.4* 76.8*  PLT 151   < > 88*   < > 120* 142*  --   --  85* 55* 62*   < > = values in this interval not displayed.   Basic Metabolic Panel: Recent Labs  Lab 01/29/18 2113 01/30/18 0721 01/31/18 0309 01/31/18 1632 01/31/18 1728 01/31/18 2021 02/01/18 1605 02/02/18 0455  NA  --  139 139 143 145 143 147* 142  K  --  3.8 3.9 3.7 3.6 3.8 3.8 3.4*  CL  --  111 108  --   --  115* 110 106  CO2  --  22 21*  --   --  20* 23 26  GLUCOSE  --  82 196*  --   --  112* 110* 268*  BUN  --  21 17  --   --  18 24* 26*  CREATININE  --  0.79 0.60  --   --  0.63 1.14* 0.96  CALCIUM  --  8.3* 9.0  --   --  7.8* 6.7* 6.8*  MG 2.0  --   --   --   --   --   --   --   PHOS 2.9  --   --   --   --   --   --   --    GFR: Estimated Creatinine Clearance: 50.3 mL/min (by C-G formula based on SCr of 0.96 mg/dL). Recent Labs  Lab 01/29/18 2130  01/31/18 0800 01/31/18 0850 01/31/18 2021 02/01/18 0130 02/01/18 2027 02/02/18 0455  WBC  --    < >  --  3.7* 1.4* 1.6*  --  27.9*  LATICACIDVEN 1.4  --  2.7*  --   --  3.3* 2.4*  --    < > = values in this interval not displayed.   Liver Function Tests: Recent Labs  Lab 01/26/18 1457 01/29/18 1701 02/02/18 0455  AST 23 32 32  ALT 20 20 18   ALKPHOS 115  64 102  BILITOT 0.4 1.2 0.9  PROT 7.4 5.7* 4.2*  ALBUMIN 4.0 3.0* 1.8*   No results for input(s): LIPASE, AMYLASE in the last 168 hours. Recent Labs  Lab 01/29/18 2130  AMMONIA 18   ABG    Component Value Date/Time   PHART 7.417 02/02/2018 0500   PCO2ART 40.4 02/02/2018 0500   PO2ART 88.8 02/02/2018 0500   HCO3 25.7 02/02/2018 0500   TCO2 17 (L) 01/31/2018 1728  ACIDBASEDEF 0.4 02/01/2018 0432   O2SAT 97.1 02/02/2018 0500    Coagulation Profile: Recent Labs  Lab 01/31/18 0850  INR 1.36   Cardiac Enzymes: Recent Labs  Lab 01/29/18 2113  01/31/18 0850 01/31/18 2021 02/01/18 0130 02/01/18 0850 02/01/18 1605  CKTOTAL 197  --   --   --   --   --   --   TROPONINI <0.03   < > <0.03 0.03* <0.03 0.04* 0.04*   < > = values in this interval not displayed.   HbA1C: No results found for: HGBA1C CBG: Recent Labs  Lab 02/01/18 1116 02/01/18 1219 02/01/18 1906 02/01/18 2307 02/02/18 0637  GLUCAP 54* 101* [place] ACNP-BC [place] Pager # [phone] OR # [phone] if no answer

## 2018-02-02 NOTE — Progress Notes (Signed)
Gynecologic Oncology Follow up  72 year old female diagnosed with Stage IV ovarian cancer who received four cycles of neoadjuvant chemotherapy with carboplatin and taxol with fourth cycle completed on 12/20/17 .  She underwent interval debulking surgery with Dr. Skeet Latch on 01/27/18 consisting of an exploratory laparotomy infra-gastric omentectomy bilateral salpingo-oophorectomy radical optimal tumor debulking, resection of anterior abdominal wall.  Her post-operative course was complicated by altered mental status noted on POD1 with improvement after Narcan administration but with return of symptoms, acute renal injury, tachycardia, dyspnea (CT chest performed 01/30/18-neg for PE), anemia.  Her AKI and altered mental status appeared to improve on 01/30/18 in the am but symptoms worsened the evening of 01/30/18 with episodes of emesis, tachycardia, hypertension, and confusion. A CT scan on 01/31/18 revealed findings most consistent with ischemic bowel with portal venous gas and bowel pneumatosis and large amount of pneumoperitoneum with concerns for peritonitis.  She was taken back to the OR on 01/31/18 with operative findings of no obvious bowel perforation or evidence of ischemia. It was felt that aspiration most likely occurred during intubation.        Subjective: Patient currently intubated with attempt to wean under guidance of PB, NP at the bedside. [name].     Objective: Vital signs in last 24 hours: Temp:  [97.7 F (36.5 C)-99.8 F (37.7 C)] 99.8 F (37.7 C) (08/28 0800) Pulse Rate:  [112-129] 125 (08/28 1134) Resp:  [27-36] 27 (08/28 1134) BP: (121-145)/(62-71) 145/71 (08/28 0330) SpO2:  [93 %-99 %] 95 % (08/28 1134) Arterial Line BP: (89-132)/(45-68) 89/45 (08/28 0600) FiO2 (%):  [50 %-60 %] 50 % (08/28 1134) Weight:  [157 lb 13.6 oz (71.6 kg)] 157 lb 13.6 oz (71.6 kg) (08/28 0500) Last BM Date: 01/29/18  Intake/Output from previous day: 08/27 0701 - 08/28 0700 In: 4804.9  [I.V.:3636.6; Blood:611; IV Piggyback:557.3] Out: 1620 [Urine:510; Emesis/NG output:700; Drains:410]  Physical Examination: General: no distress and intubated, becoming more awake during weaning process Resp: Breath sounds coarse bilaterally, intubated Cardio: tachycardic with HR at 133 bpm, reg rhythm GI: incision: midline op site dressing with staples underneath, lower portion of incision stainmarked with light tan drainage and abdomen soft, slightly tympanic, bowel sounds hypoactive Extremities: generalized edema noted in extrem JP drain x3 in the abdomen charged. Minimal serosanguinous drainage noted in JP #3.  Moderate amount of serosanguinous drainage in JP #2. Minimal amount of serous drainage in JP #1 Foley with dark yellow urine but improved since 8/27  Labs: WBC/Hgb/Hct/Plts:  27.9/11.9/35.0/62 (08/28 0455) BUN/Cr/glu/ALT/AST/amyl/lip:  26/0.96/--/18/32/--/-- (08/28 0455) Lactic acid 2.4 last pm from 3.3 in early am on 02/01/18  Assessment: 72 y.o. s/p Procedure(s): Interval debulking surgery with Dr. Skeet Latch on 01/27/18 consisting of an exploratory laparotomy infra-gastric omentectomy bilateral salpingo-oophorectomy radical optimal tumor debulking, resection of anterior abdominal wall.  Reoperation of EXPLORATORY LAPAROTOMY, ABDOMINAL WASHOUT on 01/31/18: critically ill  Heme: Post-operative anemia. Hgb 11.9 and Hct 35.0 this am. S/P transfusion of 2 units PRBCs.  PLT 62 from 55 on 02/01/18.   ID: WBC 27.9 this am.  Granix received 02/01/18 per Dr. Alvy Bimler.  On Vancomycin IV and Anidulafungin initiated 02/01/18. Meropenum IV started 01/31/18. Presumed sepsis from gastrointestinal source but no evidence of perforation after exp lap on 01/31/18.   CV: Tachycardic.  Hypotension improving. Troponin 0.04 at 16:00 on 02/01/18. Addition of beta blocker per CC. Monitoring to continue.  Resp: ARDS in setting of aspiration pneumonia. Pressure support ventilation attempt this am per Critical Care  Team. Chest 1 view this am: Stable support apparatus. Stable bilateral lung opacities as described above. Chest 1 view 01/31/18: Worsening patchy bilateral airspace disease, right greater than left concerning for infection/pneumonia.    GI:  Tolerating po: No: intubated. Protonix ordered. GI consultation appreciated. CT AP on 01/31/18 with findings above.  No obvious perforation seen on re-operation on 01/31/18. NG with dark brown output. Plan to begin TPN.  GU: AKI post-op resolved. Creatinine 0.96 this am from 1.14 in afternoon on 02/01/18. Foley in place.  175 cc output since 3 am. Plan of lasix administration today per CC.  FEN: Cmet this am.  K 3.4, Ca+ 6.8. Continue to monitor. Hypokalemia acidosis resolved per CC note.  Prophylaxis: SCDs in place. Anticoagulation on hold.  Plan: Continue plan per Critical Care and Dr. Denman George. Maintain NPO status. TPN per pharmacy. Continue to monitor JP output.  Donovan Persley D Fiorela Pelzer 02/02/2018, 12:04 PM

## 2018-02-02 NOTE — Progress Notes (Signed)
Jo Parker   DOB:[birth year]   IO#:[id number]    Assessment & Plan:   Ovarian cancer status post interval debulking surgery Continue aggressive supportive care.  Acquired pancytopenia, improved s/p GCSF injection and blood transfusion Likely due to infection/sepsis Blood transfusion was given yesterday and her blood count has improved. She now have leukocytosis I will discontinue G-CSF support  Pneumonia, possible aspiration Continue broad-spectrum IV antibiotics Broad-spectrum IV antibiotics will likely cause for the pancytopenia.  Monitor carefully  Lactic acidosis/shock Currently on pressor support and aggressive fluid resuscitation  CODE STATUS Full code  Discharge planning She will likely remain in the ICU for several days I will continue to follow Family member agree with the plan of care I will cancel her outpatient follow-up next week as she is unlikely to be discharged by then  [name], MD 02/02/2018  8:36 AM   Subjective:  The patient is ventilated and sedated.  She has received multiple units of blood transfusion and G-CSF support.  Her vital signs appears to be slightly improving with less tachycardia and improved blood pressure control.  No bleeding is noted.  She has been afebrile in the last few hours  Objective:  Vitals:   02/02/18 0800 02/02/18 0823  BP:    Pulse:  (!) 121  Resp:  (!) 36  Temp: 99.8 F (37.7 C)   SpO2:  95%     Intake/Output Summary (Last 24 hours) at 02/02/2018 0836 Last data filed at 02/02/2018 4268 Gross per 24 hour  Intake 4861.63 ml  Output 1590 ml  Net 3271.63 ml    GENERAL: She is sedated and ventilated LUNGS: Is intubated with reduced breath sounds throughout  HEART: Tachycardia is noted   Labs:  Lab Results  Component Value Date   WBC 27.9 (H) 02/02/2018   HGB 11.9 (L) 02/02/2018   HCT 35.0 (L) 02/02/2018   MCV 76.8 (L) 02/02/2018   PLT 62 (L) 02/02/2018   NEUTROABS 26.2 02/02/2018    Lab Results   Component Value Date   NA 142 02/02/2018   K 3.4 (L) 02/02/2018   CL 106 02/02/2018   CO2 26 02/02/2018    Studies:  Dg Chest 1 View  Result Date: 01/31/2018 CLINICAL DATA:  Intubated. EXAM: CHEST  1 VIEW COMPARISON:  01/31/2018 FINDINGS: Endotracheal tube is 3.7 cm above the carina. Right Port-A-Cath is unchanged. Placement of NG tube into the stomach. Patchy bilateral airspace opacities, right greater than left have increased since prior study. Heart is normal size. No visible significant effusions or acute bony abnormality. IMPRESSION: Endotracheal tube 3.7 cm above the carina. Worsening patchy bilateral airspace disease, right greater than left concerning for infection/pneumonia. Electronically Signed   By: Rolm Baptise M.D.   On: 01/31/2018 19:11   Dg Abd 1 View  Result Date: 01/31/2018 CLINICAL DATA:  NG tube placement EXAM: ABDOMEN - 1 VIEW COMPARISON:  01/31/2018 FINDINGS: NG tube tip is in the distal stomach. Surgical drains project over the abdomen and pelvis. Gas within mildly prominent large bowel. No evidence of bowel obstruction. IMPRESSION: NG tube tip in the distal stomach. Electronically Signed   By: Rolm Baptise M.D.   On: 01/31/2018 19:11   Dg Abd 1 View  Result Date: 01/31/2018 CLINICAL DATA:  Confirm nasogastric tube placement EXAM: ABDOMEN - 1 VIEW COMPARISON:  Abdomen and pelvis CT from earlier today FINDINGS: Known large volume pneumoperitoneum. Dilated small bowel from ileus or ischemia. Nasogastric tube tip is at midline epigastrium. The side port  is over the lower esophagus. These results were called by telephone at the time of interpretation on 01/31/2018 at 2:34 pm to Dr. Salvadore Dom , who verbally acknowledged these results. Patient has been posted for the OR. Also in progress of calling findings surgeon. IMPRESSION: 1. Orogastric tube tip overlaps the epigastrium and may be at the proximal stomach. Advise against reposition given there is likely pneumatosis of the  lower esophagus and proximal stomach. 2. Known large volume pneumoperitoneum and dilated small bowel. Electronically Signed   By: Monte Fantasia M.D.   On: 01/31/2018 14:36   Dg Chest Port 1 View  Result Date: 02/02/2018 CLINICAL DATA:  Pneumonia. EXAM: PORTABLE CHEST 1 VIEW COMPARISON:  Radiograph of January 31, 2018. FINDINGS: Stable cardiomediastinal silhouette. Endotracheal and nasogastric tubes are unchanged in position. Atherosclerosis of thoracic aorta is noted. Right internal jugular Port-A-Cath is unchanged in position. No pneumothorax is noted. Stable bilateral lung opacities are noted concerning for pneumonia, right greater than left. Probable bilateral pleural effusions are noted. Bony thorax is unremarkable. IMPRESSION: Stable support apparatus. Stable bilateral lung opacities as described above. Aortic Atherosclerosis (ICD10-I70.0). Electronically Signed   By: Marijo Conception, M.D.   On: 02/02/2018 07:30   Dg Chest Port 1 View  Result Date: 01/31/2018 CLINICAL DATA:  Status post a line placement. EXAM: PORTABLE CHEST 1 VIEW COMPARISON:  Portable chest x-ray of January 31, 2018 at 3:18 a.m. FINDINGS: The lungs are slightly less well inflated today. There is persistent increased density at the left lung base. There is no pneumothorax nor large pleural effusion. The heart is mildly enlarged. The pulmonary vascularity is not clearly engorged. There is calcification in the wall of the aortic arch. The power port catheter tip projects over the distal third of the SVC. There is lucency under the hemidiaphragms slightly more conspicuous than on the earlier study. IMPRESSION: Decreased inflation of both lungs. Bibasilar atelectasis or developing pneumonia greatest on the left. Mild enlargement of the cardiac silhouette without definite pulmonary edema. The reservoir catheter tip projects over the distal third of the SVC. Increased conspicuity of lucency under the hemidiaphragms. This may reflect free  extraluminal gas either postsurgical in nature or due to a perforated viscus. Correlation with the patient's postoperative course is needed. Abdominal and pelvic noncontrast CT scanning may be useful. Electronically Signed   By: David  Martinique M.D.   On: 01/31/2018 11:12   [name] Ekg Site Rite  Result Date: 01/31/2018 If Site Rite image not attached, placement could not be confirmed due to current cardiac rhythm.

## 2018-02-02 NOTE — Progress Notes (Signed)
CRITICAL VALUE ALERT  Critical Value:  Troponin 0.04  Date & Time Notied:  02/01/2018 @ 8628  Provider Notified: YES  Orders Received/Actions taken: No orders given, continue to monitor

## 2018-02-02 NOTE — Progress Notes (Signed)
[facility] NOTE   Pharmacy Consult for TPN Indication: bowel perforation  Patient Measurements: Height: 5\' 3"  (160 cm) Weight: 157 lb 13.6 oz (71.6 kg) IBW/kg (Calculated) : 52.4 TPN AdjBW (KG): 58.3 Body mass index is 27.96 kg/m. Usual Weight:   Insulin Requirements: none, ordered insulin SSI   Current Nutrition: NPO   IVF: none  Central access: 02/01/2018 TPN start date: 02/01/2018  ASSESSMENT                                                                                                          HPI: Pt is a 72 year old with ovarian cancer status post bilateral salpingo-oophorectomy and omentectomy with debulking on 8/22. Ex lap on 8/26. Pt remains in ICU critically ill on pressors, vent support. Pt also on antifungal and antibiotic meds.   Significant events:  - 8/27 Start TPN. Hold lipids for first  7 days while pt in ICU   Today:    Glucose - controlled  Electrolytes - K+ 3.4, Corr Ca is borderline low at 8.6, Others WNL  Renal - Scr 0.96  LFTs - WNL   TGs - 117 ( 8/26)   Prealbumin - 6/2 ( 8/25)   NUTRITIONAL GOALS                                                                                             RD recs: pending     Clinimix  E 5/20 at a goal rate of 80 ml/hr + 20% fat emulsion at 20 ml/hr to provide: 96 g/day protein, 2169Kcal/day.  PLAN   KCl 10 meq IV x4 ordered by MD  Calcium gluconate 1 gr IV x 1                                                                                                                          At 1800 today:  Start Clinimix E  5/20 at 40 ml/hr.  Hold 20% fat emulsion while pt is ICU status x 7 days   Plan to advance as tolerated to the goal rate.  TPN to contain standard multivitamins and trace elements.  Continue  resistant  SSI q4h.   TPN lab panels on Mondays & Thursdays.  F/u daily.   Royetta Asal, PharmD, BCPS Pager [phone] 02/02/2018 11:49 AM

## 2018-02-03 ENCOUNTER — Inpatient Hospital Stay (HOSPITAL_COMMUNITY): Payer: Medicare PPO

## 2018-02-03 DIAGNOSIS — G9341 Metabolic encephalopathy: Secondary | ICD-10-CM

## 2018-02-03 DIAGNOSIS — J9620 Acute and chronic respiratory failure, unspecified whether with hypoxia or hypercapnia: Secondary | ICD-10-CM

## 2018-02-03 DIAGNOSIS — J9601 Acute respiratory failure with hypoxia: Secondary | ICD-10-CM

## 2018-02-03 LAB — GLUCOSE, CAPILLARY
GLUCOSE-CAPILLARY: 190 mg/dL — AB (ref 70–99)
GLUCOSE-CAPILLARY: 155 mg/dL — AB (ref 70–99)
GLUCOSE-CAPILLARY: 156 mg/dL — AB (ref 70–99)
GLUCOSE-CAPILLARY: 176 mg/dL — AB (ref 70–99)
Glucose-Capillary: 148 mg/dL — ABNORMAL HIGH (ref 70–99)
Glucose-Capillary: 193 mg/dL — ABNORMAL HIGH (ref 70–99)
Glucose-Capillary: 240 mg/dL — ABNORMAL HIGH (ref 70–99)

## 2018-02-03 LAB — BASIC METABOLIC PANEL
ANION GAP: 9 (ref 5–15)
ANION GAP: 9 (ref 5–15)
Anion gap: 8 (ref 5–15)
BUN: 24 mg/dL — ABNORMAL HIGH (ref 8–23)
BUN: 22 mg/dL (ref 8–23)
BUN: 23 mg/dL (ref 8–23)
CHLORIDE: 110 mmol/L (ref 98–111)
CO2: 30 mmol/L (ref 22–32)
CO2: 30 mmol/L (ref 22–32)
CO2: 34 mmol/L — AB (ref 22–32)
Calcium: 7.9 mg/dL — ABNORMAL LOW (ref 8.9–10.3)
Calcium: 7.2 mg/dL — ABNORMAL LOW (ref 8.9–10.3)
Calcium: 7.6 mg/dL — ABNORMAL LOW (ref 8.9–10.3)
Chloride: 107 mmol/L (ref 98–111)
Chloride: 105 mmol/L (ref 98–111)
Creatinine, Ser: 0.73 mg/dL (ref 0.44–1.00)
Creatinine, Ser: 0.66 mg/dL (ref 0.44–1.00)
Creatinine, Ser: 0.66 mg/dL (ref 0.44–1.00)
GFR calc Af Amer: >60 mL/min (ref >60)
GFR calc Af Amer: >60 mL/min (ref >60)
GFR calc non Af Amer: >60 mL/min (ref >60)
GFR calc non Af Amer: >60 mL/min (ref >60)
GLUCOSE: 194 mg/dL — AB (ref 70–99)
Glucose, Bld: 198 mg/dL — ABNORMAL HIGH (ref 70–99)
Glucose, Bld: 193 mg/dL — ABNORMAL HIGH (ref 70–99)
POTASSIUM: 3.7 mmol/L (ref 3.5–5.1)
POTASSIUM: 3.5 mmol/L (ref 3.5–5.1)
POTASSIUM: 3.4 mmol/L — AB (ref 3.5–5.1)
SODIUM: 148 mmol/L — AB (ref 135–145)
SODIUM: 146 mmol/L — AB (ref 135–145)
Sodium: 148 mmol/L — ABNORMAL HIGH (ref 135–145)

## 2018-02-03 LAB — CBC WITH DIFFERENTIAL/PLATELET
BASOS PCT: 0 %
Basophils Absolute: 0 10*3/uL (ref 0.0–0.1)
EOS ABS: 0.4 10*3/uL (ref 0.0–0.7)
EOS PCT: 1 %
HEMATOCRIT: 35.3 % — AB (ref 36.0–46.0)
Hemoglobin: 12.2 g/dL (ref 12.0–15.0)
Lymphocytes Relative: 3 %
Lymphs Abs: 1.1 10*3/uL (ref 0.7–4.0)
MCH: 26.3 pg (ref 26.0–34.0)
MCHC: 34.6 g/dL (ref 30.0–36.0)
MCV: 76.2 fL — AB (ref 78.0–100.0)
MONO ABS: 0.4 10*3/uL (ref 0.1–1.0)
Monocytes Relative: 1 %
NEUTROS ABS: 34.8 10*3/uL — AB (ref 1.7–7.7)
Neutrophils Relative %: 95 %
Platelets: 57 10*3/uL — ABNORMAL LOW (ref 150–400)
RBC: 4.63 MIL/uL (ref 3.87–5.11)
RDW: 20.9 % — AB (ref 11.5–15.5)
WBC: 36.7 10*3/uL — ABNORMAL HIGH (ref 4.0–10.5)

## 2018-02-03 LAB — BLOOD GAS, ARTERIAL
ACID-BASE EXCESS: 6.6 mmol/L — AB (ref 0.0–2.0)
Bicarbonate: 31 mmol/L — ABNORMAL HIGH (ref 20.0–28.0)
DRAWN BY: 235321
FIO2: 40
MECHVT: 330 mL
O2 SAT: 86.9 %
PCO2 ART: 44.9 mmHg (ref 32.0–48.0)
PEEP: 5 cmH2O
PH ART: 7.454 — AB (ref 7.350–7.450)
Patient temperature: 98.6
RATE: 24 resp/min
pO2, Arterial: 52.6 mmHg — ABNORMAL LOW (ref 83.0–108.0)

## 2018-02-03 LAB — COMPREHENSIVE METABOLIC PANEL
ALBUMIN: 1.7 g/dL — AB (ref 3.5–5.0)
ALT: 19 U/L (ref 0–44)
AST: 31 U/L (ref 15–41)
Alkaline Phosphatase: 143 U/L — ABNORMAL HIGH (ref 38–126)
Anion gap: 8 (ref 5–15)
BUN: 23 mg/dL (ref 8–23)
CO2: 30 mmol/L (ref 22–32)
Calcium: 7.6 mg/dL — ABNORMAL LOW (ref 8.9–10.3)
Chloride: 109 mmol/L (ref 98–111)
Creatinine, Ser: 0.7 mg/dL (ref 0.44–1.00)
GFR calc Af Amer: >60 mL/min (ref >60)
GFR calc non Af Amer: >60 mL/min (ref >60)
GLUCOSE: 208 mg/dL — AB (ref 70–99)
POTASSIUM: 3.6 mmol/L (ref 3.5–5.1)
SODIUM: 147 mmol/L — AB (ref 135–145)
Total Bilirubin: 1.1 mg/dL (ref 0.3–1.2)
Total Protein: 4.6 g/dL — ABNORMAL LOW (ref 6.5–8.1)

## 2018-02-03 LAB — CULTURE, RESPIRATORY W GRAM STAIN

## 2018-02-03 LAB — CULTURE, RESPIRATORY

## 2018-02-03 LAB — MAGNESIUM: MAGNESIUM: 1.3 mg/dL — AB (ref 1.7–2.4)

## 2018-02-03 LAB — PHOSPHORUS: PHOSPHORUS: 2.9 mg/dL (ref 2.5–4.6)

## 2018-02-03 MED ORDER — FUROSEMIDE 10 MG/ML IJ SOLN
60.0000 mg | Freq: Three times a day (TID) | INTRAMUSCULAR | Status: DC
  Administered 2018-02-03 – 2018-02-08 (×12): 60 mg via INTRAVENOUS
  Filled 2018-02-03 (×12): qty 6

## 2018-02-03 MED ORDER — DEXTROSE 5 % IV SOLN
INTRAVENOUS | Status: DC
  Administered 2018-02-03 – 2018-02-17 (×9): via INTRAVENOUS

## 2018-02-03 MED ORDER — METOPROLOL TARTRATE 5 MG/5ML IV SOLN
5.0000 mg | Freq: Four times a day (QID) | INTRAVENOUS | Status: DC
  Administered 2018-02-03 – 2018-02-08 (×15): 5 mg via INTRAVENOUS
  Filled 2018-02-03 (×18): qty 5

## 2018-02-03 MED ORDER — POTASSIUM CHLORIDE 10 MEQ/100ML IV SOLN
10.0000 meq | INTRAVENOUS | Status: DC
  Administered 2018-02-03: 10 meq via INTRAVENOUS
  Filled 2018-02-03: qty 100

## 2018-02-03 MED ORDER — TRACE MINERALS CR-CU-MN-SE-ZN 10-1000-500-60 MCG/ML IV SOLN
INTRAVENOUS | Status: AC
  Administered 2018-02-03: 17:00:00 via INTRAVENOUS
  Filled 2018-02-03 (×2): qty 1440

## 2018-02-03 MED ORDER — CLONIDINE HCL 0.2 MG/24HR TD PTWK
0.2000 mg | MEDICATED_PATCH | TRANSDERMAL | Status: DC
  Administered 2018-02-03: 0.2 mg via TRANSDERMAL
  Filled 2018-02-03: qty 1

## 2018-02-03 MED ORDER — POTASSIUM CHLORIDE 10 MEQ/50ML IV SOLN
10.0000 meq | INTRAVENOUS | Status: AC
  Administered 2018-02-03 (×3): 10 meq via INTRAVENOUS
  Filled 2018-02-03 (×4): qty 50

## 2018-02-03 MED ORDER — SODIUM CHLORIDE 3 % IN NEBU
4.0000 mL | INHALATION_SOLUTION | Freq: Two times a day (BID) | RESPIRATORY_TRACT | Status: AC
  Administered 2018-02-03 – 2018-02-06 (×6): 4 mL via RESPIRATORY_TRACT
  Filled 2018-02-03 (×6): qty 4

## 2018-02-03 MED ORDER — ACETAMINOPHEN 10 MG/ML IV SOLN
1000.0000 mg | Freq: Four times a day (QID) | INTRAVENOUS | Status: AC
  Administered 2018-02-03 – 2018-02-04 (×4): 1000 mg via INTRAVENOUS
  Filled 2018-02-03 (×4): qty 100

## 2018-02-03 MED ORDER — FUROSEMIDE 10 MG/ML IJ SOLN
60.0000 mg | Freq: Once | INTRAMUSCULAR | Status: AC
  Administered 2018-02-03: 60 mg via INTRAVENOUS
  Filled 2018-02-03: qty 6

## 2018-02-03 MED ORDER — MAGNESIUM SULFATE 2 GM/50ML IV SOLN
2.0000 g | Freq: Once | INTRAVENOUS | Status: AC
  Administered 2018-02-03: 2 g via INTRAVENOUS
  Filled 2018-02-03: qty 50

## 2018-02-03 NOTE — Progress Notes (Signed)
Gynecologic Oncology Follow up  72 year old female diagnosed with Stage IV ovarian cancer who received four cycles of neoadjuvant chemotherapy with carboplatin and taxol with fourth cycle completed on 12/20/17 .  She underwent interval debulking surgery with Dr. Skeet Latch on 01/27/18 consisting of an exploratory laparotomy infra-gastric omentectomy bilateral salpingo-oophorectomy radical optimal tumor debulking, resection of anterior abdominal wall.  Her post-operative course was complicated by altered mental status noted on POD1 with improvement after Narcan administration but with return of symptoms, acute renal injury, tachycardia, dyspnea (CT chest performed 01/30/18-neg for PE), anemia.  Her AKI and altered mental status appeared to improve on 01/30/18 in the am but symptoms worsened the evening of 01/30/18 with episodes of emesis, tachycardia, hypertension, and confusion. A CT scan on 01/31/18 revealed findings most consistent with ischemic bowel with portal venous gas and bowel pneumatosis and large amount of pneumoperitoneum with concerns for peritonitis.  She was taken back to the OR on 01/31/18 with operative findings of no obvious bowel perforation or evidence of ischemia. It was felt that aspiration most likely occurred during intubation.        Subjective: Patient intubated. Husband at bedside in the chair asleep.     Objective: Vital signs in last 24 hours: Temp:  [98.4 F (36.9 C)-101.1 F (38.4 C)] 101.1 F (38.4 C) (08/29 0800) Pulse Rate:  [100-125] 113 (08/29 0845) Resp:  [20-41] 27 (08/29 0845) BP: (165)/(92) 165/92 (08/29 0800) SpO2:  [90 %-100 %] 98 % (08/29 0845) Arterial Line BP: (124-271)/(58-100) 177/73 (08/29 0845) FiO2 (%):  [40 %-50 %] 45 % (08/29 0844) Weight:  [154 lb 8.7 oz (70.1 kg)-157 lb 13.6 oz (71.6 kg)] 154 lb 8.7 oz (70.1 kg) (08/29 0500) Last BM Date: 02/02/18  Intake/Output from previous day: 08/28 0701 - 08/29 0700 In: 2684.4 [I.V.:1228.6; IV  Piggyback:1400.8] Out: 5154 [Urine:4700; Emesis/NG output:200; Drains:254]  Physical Examination: General: no distress and intubated Resp: coarse breath sounds bilaterally, rhonchi noted Cardio: heart rate at 113, regular rhythm GI: incision: midline incision with op site dressing in place, stainmarked, no active drainage noted and abdomen slightly distended, hypoactive bowel sounds Extremities: generalized edema in all extrem JP drain x3 in the abdomen charged. Minimal serosanguinous drainage noted. Foley with clear, light yellow urine   Labs: WBC/Hgb/Hct/Plts:  36.7/12.2/35.3/57 (08/29 0410) BUN/Cr/glu/ALT/AST/amyl/lip:  23/0.70/--/19/31/--/-- (08/29 0410)   Assessment: 72 y.o. s/p Procedure(s): Interval debulking surgery with Dr. Skeet Latch on 01/27/18 consisting of an exploratory laparotomy infra-gastric omentectomy bilateral salpingo-oophorectomy radical optimal tumor debulking, resection of anterior abdominal wall.  Reoperation of EXPLORATORY LAPAROTOMY, ABDOMINAL WASHOUT on 01/31/18: critically ill  Heme: Post-operative anemia. Hgb 12.2 and Hct 35.3 this am. S/P transfusion of 2 units PRBCs.  PLT 57 from 62 on 02/02/18.   ID: WBC 36.7 this am.  Granix received 02/01/18 per Dr. Alvy Bimler.  On Vancomycin IV and Anidulafungin initiated 02/01/18. Meropenum IV started 01/31/18. Presumed sepsis from gastrointestinal source but no evidence of perforation after exp lap on 01/31/18. Blood cultures from 01/31/18 with no growth. Tracheal aspirate from 02/01/18 resulting few gram positive rods.   CV: Tachycardia improved from 02/02/18.  Hypertensive. Troponin 0.04 at 16:00 on 02/01/18. Addition of beta blocker per CC. Monitoring to continue.  Resp: ARDS in setting of aspiration pneumonia.  Chest 1 view this am: Stable appearance of the chest when compared with the previous day per Dr. Golden Circle. Reviewed imaging with PB, NP. Plan to attempt to wean FiO2 today per CC.  GI:  Tolerating po: No: intubated. Protonix  ordered. GI consultation appreciated. CT AP on 01/31/18 with findings above.  No obvious perforation seen on re-operation on 01/31/18. NG with brown output. TPN initiated 02/02/18.  GU: AKI post-op resolved. Creatinine 0.70 this am. Foley in place.  Plan of diuresis again today per CC.  FEN: Cmet this am.  K 3.6, Ca+ 7.6 s/p replacement of both. Continue to monitor. Hypokalemia acidosis resolved per CC note.  Prophylaxis: SCDs in place. Anticoagulation on hold due to persistent thrombocytopenia from sepsis. No active bleeding and H&H stable.  Plan: Continue plan per Critical Care and Dr. Denman George. Maintain NPO status. Continue TPN per pharmacy. Strict intake and output   Melissa D Cross 02/03/2018, 9:14 AM

## 2018-02-03 NOTE — Progress Notes (Signed)
[facility] Day 7, POD/7/3  Physical examination WD female, sedated, Movement with stimulation, intubated.  Husband and son are present in the room. O.  BP (!) 165/92   Pulse (!) 113   Temp (!) 101.1 F (38.4 C) (Oral)   Resp (!) 27   Ht '5\' 3"'$  (1.6 m)   Wt 154 lb 8.7 oz (70.1 kg)   SpO2 98%   BMI 27.38 kg/m    Intake/Output Summary (Last 24 hours) at 02/03/2018 1050 Last data filed at 02/03/2018 0800 Gross per 24 hour  Intake 1879 ml  Output 5730 ml  Net -3851 ml    Vent Mode: PRVC FiO2 (%):  [40 %-50 %] 45 % Set Rate:  [20 bmp-24 bmp] 20 bmp Vt Set:  [330 mL] 330 mL PEEP:  [5 cmH20-10 cmH20] 10 cmH20 Plateau Pressure:  [6 cmH20-24 cmH20] 6 cmH20  Scheduled Meds: . chlorhexidine gluconate (MEDLINE KIT)  15 mL Mouth Rinse BID  . cloNIDine  0.2 mg Transdermal Weekly  . furosemide  60 mg Intravenous Q8H  . insulin aspart  0-20 Units Subcutaneous Q4H  . ipratropium-albuterol  3 mL Nebulization TID  . mouth rinse  15 mL Mouth Rinse 10 times per day  . metoprolol tartrate  5 mg Intravenous Q6H  . pantoprazole (PROTONIX) IV  40 mg Intravenous Q12H  . sodium chloride flush  10-40 mL Intracatheter Q12H   Continuous Infusions: . Marland KitchenTPN (CLINIMIX-E) Adult 40 mL/hr at 02/02/18 2200  . acetaminophen 1,000 mg (02/03/18 1047)  . anidulafungin 100 mg (02/03/18 1020)  . dexmedetomidine (PRECEDEX) IV infusion 1.2 mcg/kg/hr (02/03/18 0512)  . dextrose 10 mL/hr at 02/03/18 0959  . magnesium sulfate 1 - 4 g bolus IVPB 2 g (02/03/18 1002)  . meropenem (MERREM) IV Stopped (02/03/18 0908)  . potassium chloride 10 mEq (02/03/18 1044)  . vancomycin Stopped (02/03/18 1049)   PRN Meds:.fentaNYL (SUBLIMAZE) injection, sodium chloride flush  Neuro Sedated, moves with stimulation  Pulmonary Decreased breath sounds at bases.  Concern for aspiration pneumonia.  Failed breathing trial yesterday ICU plan for lasix and to reassess pulmonary status  Cardiac: Off pressure  support Diffuse peripheral edema - Albumin 1.7  FEN Suspect microperforation with spontaneous resolution. NGT with dark green output.  NPO at present.  Albumin 1.7 On TPN.  Will discuss when would be appropriate to transfer to enteral feeds  Metabolic Mg 1.3  ID Resolving sepsis?  Currently on anidulaifungin WBC elevation presumed secondary to granix  Renal Improved urine output.  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  Path notable for only surface involvement of the ovaries and fallopian tubes. Consider removal of drains.

## 2018-02-03 NOTE — Progress Notes (Addendum)
Jo Parker  QZR:[id number] DOB: [birth year] DOA: 01/27/2018 PCP: [name], MD    Reason for Consult / Chief Complaint:  sepsis  Consulting MD:  Gerarda Fraction  HPI/Brief Narrative   This is a 72 year old African-American female with presumed diagnosis of ovarian adenocarcinoma felt stage IVb.  She is completed 3 cycles of chemotherapy and is now status post bilateral salpingo-oophorectomy and omentectomy with debulking on 8/22. 8/23: Difficult with analgesic control.  This was complicated by patient becoming somnolent and hypotensive after adjustment of narcotics, treated with Saline and adjustment of medications. 8/24: Still somnolent, noted to be confused.  Is fused 1 unit for hemoglobin of 6.7.  Internal medicine consulted for acute encephalopathy also postoperative acute kidney injury.  8/25: Remains encephalopathic.  Hemoglobin improved posttransfusion.  Progress note commenting on dyspnea. Had episode of vomiting shortly after arriving in [facility] unit.  More hypertensive and tachycardic. 8/26:hypertensive having some chest pain but remained confused troponins were negative.  Again vomiting.  Transferred to stepdown unit for Cardene drip. Diagnostic imaging raising concern for perforation w/ free air noted.  Imipenem started.  Went for exploratory laparotomy with washout.  No significant contamination identified, no obvious source of perforation.  Returned with abdominal drains postoperative chest x-ray showing evidence of aspiration pneumonia; mixed respiratory and metabolic acidosis.   8/27: Hypotensive, chest x-ray with significant right greater than left airspace disease.  Remains on low-dose pressors, still sedated, hemoglobin dropped from 8.8 to 7.4; got 2 units of blood,  febrile, adding vancomycin.  Neupogen given for neutropenia. 8/28: Pressors off/shock resolved. NAG acidosis and Lactic acidosis resolved.  FiO2/PEEP down, attempting pressure support ventilation.  Blood cell count  up.  Chest x-ray still with significant right greater than left airspace disease.  Discontinuing sedating medications.  Starting TPN 8/29: FiO2 and PEEP requirements increased, had wean these down to 40/5 now 60/10.  Chest x-ray with worsening right-sided airspace disease likely reflecting the recruitment.  Hemodynamically remains stable, actually hypertensive.  Delirium remains significant obstacle  Resolved hospital Problems  Septic shock 8/28 AKI  Assessment & Plan:   Sepsis,  fever  with neutropenia: peritonitis (microperforation), complicated by aspiration pneumonia.  -off levophed -No spiking fevers Plan Day #3 vancomycin and Eraxis Day #4 meropenem KVO IV fluids  Status post exploratory laparotomy for free air on 8/26 ; following bilateral salpingo-oophorectomy, omentectomy, and debulking surgery on 8/22 Plan N.p.o.  Drain in surgical site care per surgical services  Continuing TPN   Acute hypoxic respiratory failure in the setting of aspiration pneumonia -Portable chest x-ray personally reviewed: This demonstrates worsening right-sided airspace disease, with increased consolidation on the right side, also some worsening of the left-sided airspace disease as well.  Suspect some of this reflects the recruitment Plan We will keep PEEP at 10, focus on weaning FiO2 today Adding hypertonic saline for mucus mobilization Continue pulse oximetry PAD protocol RASS goal 0 to -1 Escalate diuresis aiming for significantly negative volume status Follow-up x-ray a.m. Antibiotics per above  Sinus tachycardia, hypertension, and grade 1 diastolic dysfunction with minimal troponin I bump.  Echo showed hyperdynamic ejection fraction with no wall motion abnormality Plan Increasing beta-blockade Diuresis as above Continue telemetry monitoring  Acute metabolic encephalopathy.  Suspect this is multifactorial and secondary to sepsis as well as residual narcotics and metabolic acidosis, this  remains a significant barrier. Plan PAD protocol RASS goal 0 to -1 Continuing Precedex and PRN fentanyl Family educated on delirium interventions  At risk for recurrent AKI.  Creatinine bumped yesterday, has now improved once again Plan Ensure mean arterial pressure greater than 65 but also treating hypertension Renal dosing medications as tolerated Strict intake and output Follow-up chemistry with active diuresis  At risk for fluid and electrolyte imbalance: Hypomagnesemia, hypernatremia Plan Change KVO fluids to D5 water Replace magnesium Continuing diuresis Daily chemistries  Postoperative anemia.  Does have coffee ground gastric tube output, this seems to have improved Hemoglobin stable Plan Trend CBC  Leukocytosis.  Initially was neutropenic however did receive Granix on 8/28 per oncology Plan Trend CBC  thrombocytopenia Plan Continue SCDs Follow-up a.m. chemistry  Protein calorie malnutrition in the setting of cancer Plan Continue TPN  Hyperglycemia Plan Sliding scale insulin  Stage IV ovarian cancer, now status post bilateral salpingo-oophorectomy omentectomy and debulking surgery on 8/22 Plan GYN/ONC  Best practice/Goals of care/disposition.   DVT prophylaxis: LMWH-->SCD (8/26); continuing SCDs as of 8/29 given thrombocytopenia GI prophylaxis: NA-->H2B, changed to PPI on 8/27 Diet: NPO, starting TPN on 8/28 Mobility:BR Code Status: Full Code Family Communication: husband and son updated   Disposition / Summary of Today's Plan 02/03/18   Working diagnosis is severe sepsis secondary to aspiration pneumonia, and microperforation with peritonitis.  Her primary obstacles at this point remain delirium, and it appears as though she has had some pulmonary de-recruitment likely evidence of consequences of aggressive weaning yesterday.  For today the focus will be on diuresis, pulmonary hygiene measures adding hypertonic saline, imaging blood pressure, I think  her hypertension has been managed with sedation instead of antihypertensives overnight.  And continuing supportive care with antibiotics, antifungals, and TPN.  We will hold her PEEP at 10 today.  Hopefully we can re-evaluate weaning once again on 8/30  Consultants:  Internal medicine 8/25 Critical care 8/26 General surgery 8/26 GI medicine 8/27: They did not feel she had overt GI bleed.  Recommended prophylaxis with PPI.  Signed off. Procedures: bilateral salpingo-nephrectomy omentectomy and debulking surgery on 8/22 8/26: Intubation 8/26: Exploratory laparotomy, washout, placement of right upper quadrant x2 JP drain and left lower quadrant JP drain  Significant Diagnostic Tests: CT abdomen 8/26: Hemic bowel with portal venous gas and bowel pneumatosis with large amount of pneumoperitoneum.  Unclear as to site of perforation.  Multiple pockets of air adjacent to the sigmoid colon.  Enhancement of the peritoneum concerning for peritonitis.  There was aortic arterial sclerosis. Echocardiogram 8/27: Particular ejection fraction 65 to 70%.  She has moderate LVH, and hyperdynamic LV function.  There is grade 1 diastolic dysfunction.  Trivial MR.  Normal left atrial size.  The IV C was not visualized  Micro Data: Blood Culture times two 8/26>>> Sputum 8/27: Abundant yeast, few gram-positive rods>>>  Antimicrobials:  Meropenem 8/26>>> Vancomycin 8/27>>> Eraxis 8/27>>> Subjective  Unable to assess pain.  Appears comfortable on pressure support initially  Objective    Examination: General: This is a chronically ill-appearing 72 year old African-American female she is currently sedated on the ventilator HEENT atraumatic.  She does have scleral edema today, mucous membranes are moist.  Orally intubated. Pulmonary: Coarse scattered rhonchi, right worse than left.  No accessory muscle use appreciated. Cardiac: Regular rate and rhythm with appreciated murmur Abdomen: Dressing is intact, bowel  sounds are hypoactive.  Drains with serous drainage.  There is abdominal edema appreciated GU: Clear yellow via Foley catheter Extremities: Warm, dry, brisk cap refill.  She has diffuse anasarca Neuro: Currently heavily sedated.   Blood pressure (Abnormal) 165/92, pulse (Abnormal) 113, temperature (Abnormal) 101.1 F (38.4 C),  temperature source Oral, resp. rate (Abnormal) 27, height 5\' 3"  (1.6 m), weight 70.1 kg, SpO2 98 %. CVP:  [7 mmHg-15 mmHg] 9 mmHg  Vent Mode: PRVC FiO2 (%):  [40 %-50 %] 45 % Set Rate:  [24 bmp] 24 bmp Vt Set:  [330 mL] 330 mL PEEP:  [5 cmH20-10 cmH20] 10 cmH20 Plateau Pressure:  [6 cmH20-24 cmH20] 6 cmH20   Intake/Output Summary (Last 24 hours) at 02/03/2018 0910 Last data filed at 02/03/2018 0800 Gross per 24 hour  Intake 2054.04 ml  Output 5730 ml  Net -3675.96 ml   Filed Weights   02/02/18 0500 02/02/18 1111 02/03/18 0500  Weight: 71.6 kg 71.6 kg 70.1 kg     Labs   CBC: Recent Labs  Lab 01/29/18 2113  01/31/18 0850  01/31/18 1728 01/31/18 2021 02/01/18 0130 02/02/18 0455 02/03/18 0410  WBC 3.5*   < > 3.7*  --   --  1.4* 1.6* 27.9* 36.7*  NEUTROABS 2.6  --   --   --   --   --   --  26.2 34.8*  HGB 8.6*   < > 10.1*   < > 8.8* 11.4* 7.4* 11.9* 12.2  HCT 27.1*   < > 30.3*   < > 26.0* 34.5* 22.4* 35.0* 35.3*  MCV 73.4*   < > 71.1*  --   --  72.9* 73.4* 76.8* 76.2*  PLT 88*   < > 142*  --   --  85* 55* 62* 57*   < > = values in this interval not displayed.   Basic Metabolic Panel: Recent Labs  Lab 01/29/18 2113  02/01/18 1605 02/02/18 0455 02/02/18 1613 02/03/18 0252 02/03/18 0410  NA  --    < > 147* 142 148* 148* 147*  K  --    < > 3.8 3.4* 2.9* 3.7 3.6  CL  --    < > 110 106 115* 110 109  CO2  --    < > 23 26 25 30 30   GLUCOSE  --    < > 110* 268* 71 198* 208*  BUN  --    < > 24* 26* 20 24* 23  CREATININE  --    < > 1.14* 0.96 0.63 0.73 0.70  CALCIUM  --    < > 6.7* 6.8* 6.1* 7.9* 7.6*  MG 2.0  --   --   --   --   --  1.3*  PHOS  2.9  --   --   --   --   --  2.9   < > = values in this interval not displayed.   GFR: Estimated Creatinine Clearance: 59.7 mL/min (by C-G formula based on SCr of 0.7 mg/dL). Recent Labs  Lab 01/29/18 2130  01/31/18 0800  01/31/18 2021 02/01/18 0130 02/01/18 2027 02/02/18 0455 02/03/18 0410  WBC  --    < >  --    < > 1.4* 1.6*  --  27.9* 36.7*  LATICACIDVEN 1.4  --  2.7*  --   --  3.3* 2.4*  --   --    < > = values in this interval not displayed.   Liver Function Tests: Recent Labs  Lab 01/29/18 1701 02/02/18 0455 02/03/18 0410  AST 32 32 31  ALT 20 18 19   ALKPHOS 64 102 143*  BILITOT 1.2 0.9 1.1  PROT 5.7* 4.2* 4.6*  ALBUMIN 3.0* 1.8* 1.7*   No results for input(s): LIPASE, AMYLASE in the last  168 hours. Recent Labs  Lab 01/29/18 2130  AMMONIA 18   ABG    Component Value Date/Time   PHART 7.454 (H) 02/03/2018 0345   PCO2ART 44.9 02/03/2018 0345   PO2ART 52.6 (L) 02/03/2018 0345   HCO3 31.0 (H) 02/03/2018 0345   TCO2 17 (L) 01/31/2018 1728   ACIDBASEDEF 0.4 02/01/2018 0432   O2SAT 86.9 02/03/2018 0345    Coagulation Profile: Recent Labs  Lab 01/31/18 0850  INR 1.36   Cardiac Enzymes: Recent Labs  Lab 01/29/18 2113  01/31/18 0850 01/31/18 2021 02/01/18 0130 02/01/18 0850 02/01/18 1605  CKTOTAL 197  --   --   --   --   --   --   TROPONINI <0.03   < > <0.03 0.03* <0.03 0.04* 0.04*   < > = values in this interval not displayed.   HbA1C: No results found for: HGBA1C CBG: Recent Labs  Lab 02/02/18 2047 02/03/18 0100 02/03/18 0102 02/03/18 0343 02/03/18 0828  GLUCAP 132* >600* 148* 190* 155*      Erick Colace ACNP-BC [facility] Pager # [phone] OR # [phone] if no answer

## 2018-02-03 NOTE — Progress Notes (Signed)
Nutrition Follow-up  DOCUMENTATION CODES:   Not applicable  INTERVENTION:  - Continue TPN per Pharmacy. - Monitor magnesium, potassium, and phosphorus daily for at least 3 days, MD to replete as needed, as pt is at risk for refeeding syndrome given poor appetite/PO intakes since admission and for unknown amount of time PTA, current hypomagnesemia.   NUTRITION DIAGNOSIS:   Increased nutrient needs related to post-op healing as evidenced by estimated needs. -ongoing  GOAL:   Patient will meet greater than or equal to 90% of their needs -unmet with current TPN rate.   MONITOR:   Vent status, Weight trends, Labs, Skin, I & O's, Other (Comment)(TPN regimen)  REASON FOR ASSESSMENT:   Consult New TPN/TNA  ASSESSMENT:   Patient with PMH significant for COPD< HLD, HTN, mitral valve prolapse, and Stage IV ovarian cancer s/p neoadjuvant chemotherapy . Presents this admission for robotic assisted bilateral salpingo-oophorectomy omentectomy interval debulking.   Significant Events: 8/22: ex lap with infra-gastric omentectomy bilateral salpingo-oophorectomy radical optimal tumor debulking, resection of anterior abdominal wall 8/24: remained somnolent and was confused, noted to have post-op UTI 8/26: remained confused and having chest pain; transferred to SDU for Cardene drip; x-ray showed free air and CT abd concerning for ischemic bowel, bowel pneumatosis, and large amount of pneumoperitoneum; repeat ex lap with washout. 8/27: triple lumen PICC placed. 8/28: TPN initiated; ILE hold x7 days; received Granix per Oncology.    Patient remains intubated with NGT in place to LIS; scant amount of dark drainage present in canister and throughout tubing. Son at bedside at this time. Patient with triple lumen PICC and TPN initiated yesterday evening. She is currently receiving Clinimix E 5/20 @ 40 mL/hr which is providing 845 kcal and 48 grams of protein. ILE on hold x7 days per ICU protocol. No  Pharmacy note yet this AM.  Recommended goal for TPN: Clinimix E 5/15 @ 75 mL/hr with 20% ILE @ 20 mL/hr x12 hours/day. This regimen will provide 1758 kcal and 90 grams of protein.  Question if CBG draw at 0100 was taken from PICC as reading >600 mg/dL and CBGs yesterday through the rest of this AM have been </= 190 mg/dL and reading at 0102 was 148 mg/dL.  Per Pete's note this AM: sepsis complicated by aspiration PNA, peritonitis/microperf, CXR indicated worsening bilateral airspace disease, increase diuresis with goal of negative volume status, acute metabolic encephalopathy thought to be multifactoral, at risk for recurrent AKI, leukocytosis.   Patient is currently intubated on ventilator support MV: 11 L/min Temp (24hrs), Avg:99.9 F (37.7 C), Min:98.4 F (36.9 C), Max:101.1 F (38.4 C) Propofol: none BP: 165/92 and MAP: 116   Medications reviewed; 1 g Ca gluconate x1 run yesterday, 60 mg IV Lasix TID, sliding scale Novolog, 2 g IV Mg sulfate x1 run today, 40 mg IV Protonix BID, 10 mEq IV KCl x6 runs yesterday and x4 runs today.  Labs reviewed; CBGs: 148, 190, and 155 mg/dL since 0102, Na: 147 mmol/L, Ca: 7.6 mg/dL, Mg: 1.3 mg/dL, alk Phos elevated.  IVF; D5 @ 10 mL/hr started at 0945 (41 kcal). Drip; Precedex @ 1.2 mcg/kg/hr.     Diet Order:   Diet Order            Diet NPO time specified  Diet effective now              EDUCATION NEEDS:   Education needs have been addressed  Skin:  Skin Assessment: Skin Integrity Issues: Skin Integrity Issues:: Incisions Incisions:  abdominal (8/26)  Last BM:  8/28  Height:   Ht Readings from Last 1 Encounters:  01/31/18 '5\' 3"'$  (1.6 m)    Weight:   Wt Readings from Last 1 Encounters:  02/03/18 70.1 kg    Ideal Body Weight:  52.3 kg  BMI:  Body mass index is 27.38 kg/m.  Estimated Nutritional Needs:   Kcal:  1566  Protein:  87-99 grams (1.5-1.7 grams/kg)  Fluid:  >/= 1.5 L/day     Jarome Matin, MS, RD,  LDN, [facility] Inpatient Clinical Dietitian Pager # [phone] After hours/weekend pager # [phone]

## 2018-02-03 NOTE — Progress Notes (Signed)
[facility] CONSULT NOTE   Pharmacy Consult for TPN Indication: bowel perforation  Patient Measurements: Height: '5\' 3"'$  (160 cm) Weight: 154 lb 8.7 oz (70.1 kg) IBW/kg (Calculated) : 52.4 TPN AdjBW (KG): 58.3 Body mass index is 27.38 kg/m.  Insulin Requirements: 10 units resistant SSI (no meds PTA)  Current Nutrition: NPO   IVF: LR @ 10 - Also on continuous Levophed at 19 ml/hr, and Precedex at 21.5 ml/hr - Furosemide dosing daily (60 mg IV x1 on 6/28)  Central access: 02/01/2018 TPN start date: 02/01/2018  ASSESSMENT                                                                                                          HPI: Pt is a 72 year old with ovarian cancer status post bilateral salpingo-oophorectomy and omentectomy with debulking on 8/22. Ex lap on 8/26. Pt remains in ICU critically ill on pressors, vent support.  She has had poor appetite and poor PO intakes since admission and for unknown amount of time PTA and will be at risk for refeeding syndrome.  Significant events:  - 8/27 Start TPN. Hold lipids for first  7 days while pt in ICU   Today:   Glucose - CBG range 73-190 (suspect that CBG > 600 was erroneous as recheck 2 min later was 148).  Covering with SSI.  No history of DM.  Electrolytes -  Na 147 (hypoNa TPN formula may help with elevated Na, noted plans for lasix diuresis d/t volume overload), K 3.6 (after 10 run IV replacement 8/28), Mag 1.3.  Other WNL including Corr Ca 9.44, Phos and Mag  Renal - Scr 0.7  I/O: current > 11L positive over course of this admission.  Increasing lasix and aiming for negative volume status.    LFTs - Alk phos up to 143.  AST/ALT and Tbili WNL.  TGs - 117 ( 8/26) Propofol d/c on 8/28.  Prealbumin - 6/2 ( 8/25)   NUTRITIONAL GOALS                                                                                             RD recs (8/27): Kcal:  7622, Protein:  87-99 grams (1.5-1.7 grams/kg),  Fluid:  >/= 1.5 L/day  Based on published guidelines, while the patient meets ICU status the initiation of lipids is being delayed for 7 days or until transition out of ICU. Planned start date for lipids is 9/4.  Goal will be to meet 100% of the patient's protein needs and approximately 80% of their caloric needs.  Clinimix 5/20 @ 80 mL/hr to provide 96 grams protein (100% of goal) and 1689 kcal (100% of goal)  Glucose infusion rate will be 3.8 mg/kg/min (Maximum 5 mg/kg/min)   PLAN  Now: KCl 10 meq / 65m IV (central line) x4 runs Mag 2g IV x1 dose                                                                                                                         At 1800 today:  Advance to Clinimix E 5/20 at 60 ml/hr.  Plan to advance as tolerated to the goal rate.  Monitor I/O status, discussed titration of TPN with PLaurey ArrowNP.  OK to titrate, will diurese as needed for goal negative volume.    TPN to contain standard multivitamins and trace elements.  Hold 20% fat emulsion while pt is ICU status x 7 days.  Start on 9/4.  Continue resistant  SSI q4h.   TPN lab panels on Mondays & Thursdays.  Daily BMET, Mag, Phos x3 days    F/u daily.   CGretta ArabPharmD, BCPS Pager [phone]/29/2019 7:32 AM

## 2018-02-03 NOTE — Care Management Note (Signed)
Case Management Note  Patient Details  Name: Jo Parker MRN: [id number] Date of Birth: 30-Jun-1945  Subjective/Objective:      From chart 8/29: FiO2 and PEEP requirements increased, had wean these down to 40/5 now 60/10.  Chest x-ray with worsening right-sided airspace disease likely reflecting the recruitment.  Hemodynamically remains stable, actually hypertensive.  Delirium remains significant obstacle  Resolved hospital Problems  Septic shock 8/28 AKI  Assessment & Plan:   Sepsis,  fever  with neutropenia: peritonitis (microperforation), complicated by aspiration pneumonia.  -off levophed -No spiking fevers Plan Day #3 vancomycin and Eraxis Day #4 meropenem KVO IV fluids  Status post exploratory laparotomy for free air on 8/26 ; following bilateral salpingo-oophorectomy, omentectomy, and debulking surgery on 8/22 Plan N.p.o.  Drain in surgical site care per surgical services  Continuing TPN   Acute hypoxic respiratory failure in the setting of aspiration pneumonia -Portable chest x-ray personally reviewed: This demonstrates worsening right-sided airspace disease, with increased consolidation on the right side, also some worsening of the left-sided airspace disease as well.  Suspect some of this reflects the recruitment Plan We will keep PEEP at 10, focus on weaning FiO2 today Adding hypertonic saline for mucus mobilization Continue pulse oximetry PAD protocol RASS goal 0 to -1 Escalate diuresis aiming for significantly negative volume status Follow-up x-ray a.m. Antibiotics per above  Sinus tachycardia, hypertension, and grade 1 diastolic dysfunction with minimal troponin I bump.  Echo showed hyperdynamic ejection fraction with no wall motion abnormality Plan Increasing beta-blockade Diuresis as above Continue telemetry monitoring  Acute metabolic encephalopathy.  Suspect this is multifactorial and secondary to sepsis as well as residual narcotics and  metabolic acidosis, this remains a significant barrier. Plan PAD protocol RASS goal 0 to -1 Continuing Precedex and PRN fentanyl Family educated on delirium interventions  At risk for recurrent AKI.  Creatinine bumped yesterday, has now improved once again Plan Ensure mean arterial pressure greater than 65 but also treating hypertension Renal dosing medications as tolerated Strict intake and output Follow-up chemistry with active diuresis  At risk for fluid and electrolyte imbalance: Hypomagnesemia, hypernatremia Plan Change KVO fluids to D5 water Replace magnesium Continuing diuresis Daily chemistries  Postoperative anemia.  Does have coffee ground gastric tube output, this seems to have improved Hemoglobin stable Plan Trend CBC  Leukocytosis.  Initially was neutropenic however did receive Granix on 8/28 per oncology Plan Trend CBC  thrombocytopenia Plan Continue SCDs Follow-up a.m. chemistry  Protein calorie malnutrition in the setting of cancer Plan Continue TPN  Hyperglycemia Plan Sliding scale insulin  Stage IV ovarian cancer, now status post bilateral salpingo-oophorectomy omentectomy and debulking surgery on 8/22 Plan GYN/ONC  Best practice/Goals of care/disposition.   DVT prophylaxis: LMWH-->SCD (8/26); continuing SCDs as of 8/29 given thrombocytopenia GI prophylaxis: NA-->H2B, changed to PPI on 8/27 Diet: NPO, starting TPN on 8/28 Mobility:BR Code Status: Full Code Family Communication: husband and son updated   Disposition / Summary of Today's Plan 02/03/18   Working diagnosis is severe sepsis secondary to aspiration pneumonia, and microperforation with peritonitis.  Her primary obstacles at this point remain delirium, and it appears as though she has had some pulmonary de-recruitment likely evidence of consequences of aggressive weaning yesterday.  For today the focus will be on diuresis, pulmonary hygiene measures adding hypertonic  saline, imaging blood pressure, I think her hypertension has been managed with sedation instead of antihypertensives overnight.  And continuing supportive care with antibiotics, antifungals, and TPN.  We will hold her PEEP at  10 today.  Hopefully we can re-evaluate weaning once again on 8/30                Action/Plan: Cm following discharge and cm needs and progression of care.  Expected Discharge Date:  01/29/18               Expected Discharge Plan:  Home/Self Care  In-House Referral:     Discharge planning Services  CM Consult  Post Acute Care Choice:    Choice offered to:     DME Arranged:    DME Agency:     HH Arranged:    HH Agency:     Status of Service:  In process, will continue to follow  If discussed at Long Length of Stay Meetings, dates discussed:    Additional Comments:  Leeroy Cha, RN 02/03/2018, 10:39 AM

## 2018-02-04 ENCOUNTER — Inpatient Hospital Stay (HOSPITAL_COMMUNITY): Payer: Medicare PPO

## 2018-02-04 DIAGNOSIS — C800 Disseminated malignant neoplasm, unspecified: Secondary | ICD-10-CM

## 2018-02-04 DIAGNOSIS — N179 Acute kidney failure, unspecified: Secondary | ICD-10-CM

## 2018-02-04 LAB — GLUCOSE, CAPILLARY
GLUCOSE-CAPILLARY: 215 mg/dL — AB (ref 70–99)
Glucose-Capillary: 212 mg/dL — ABNORMAL HIGH (ref 70–99)
Glucose-Capillary: 112 mg/dL — ABNORMAL HIGH (ref 70–99)
Glucose-Capillary: 154 mg/dL — ABNORMAL HIGH (ref 70–99)
Glucose-Capillary: 137 mg/dL — ABNORMAL HIGH (ref 70–99)

## 2018-02-04 LAB — CBC WITH DIFFERENTIAL/PLATELET
BASOS PCT: UNDETERMINED %
Band Neutrophils: UNDETERMINED %
Basophils Absolute: UNDETERMINED 10*3/uL (ref 0.0–0.1)
Basophils Absolute: 0 10*3/uL (ref 0.0–0.1)
Basophils Relative: 0 %
Blasts: UNDETERMINED %
EOS ABS: UNDETERMINED 10*3/uL (ref 0.0–0.7)
EOS PCT: 3 %
Eosinophils Absolute: 0.7 10*3/uL (ref 0.0–0.7)
Eosinophils Relative: UNDETERMINED %
HCT: UNDETERMINED % (ref 36.0–46.0)
HEMATOCRIT: 33.9 % — AB (ref 36.0–46.0)
HEMOGLOBIN: 11.1 g/dL — AB (ref 12.0–15.0)
Hemoglobin: UNDETERMINED g/dL (ref 12.0–15.0)
Lymphocytes Relative: UNDETERMINED %
Lymphocytes Relative: 6 %
Lymphs Abs: UNDETERMINED 10*3/uL (ref 0.7–4.0)
Lymphs Abs: 1.4 10*3/uL (ref 0.7–4.0)
MCH: UNDETERMINED pg (ref 26.0–34.0)
MCH: 25.9 pg — ABNORMAL LOW (ref 26.0–34.0)
MCHC: UNDETERMINED g/dL (ref 30.0–36.0)
MCHC: 32.7 g/dL (ref 30.0–36.0)
MCV: UNDETERMINED fL (ref 78.0–100.0)
MCV: 79 fL (ref 78.0–100.0)
MONOS PCT: 2 %
MYELOCYTES: UNDETERMINED %
Metamyelocytes Relative: UNDETERMINED %
Monocytes Absolute: UNDETERMINED 10*3/uL (ref 0.1–1.0)
Monocytes Absolute: 0.5 10*3/uL (ref 0.1–1.0)
Monocytes Relative: UNDETERMINED %
NEUTROS PCT: UNDETERMINED %
NEUTROS PCT: 89 %
NRBC: UNDETERMINED /100{WBCs}
Neutro Abs: UNDETERMINED 10*3/uL (ref 1.7–7.7)
Neutro Abs: 20.7 10*3/uL — ABNORMAL HIGH (ref 1.7–7.7)
Other: UNDETERMINED %
PLATELETS: UNDETERMINED 10*3/uL (ref 150–400)
PROMYELOCYTES RELATIVE: UNDETERMINED %
Platelets: 52 10*3/uL — ABNORMAL LOW (ref 150–400)
RBC Morphology: UNDETERMINED
RBC: UNDETERMINED MIL/uL (ref 3.87–5.11)
RBC: 4.29 MIL/uL (ref 3.87–5.11)
RDW: UNDETERMINED % (ref 11.5–15.5)
RDW: 22.1 % — ABNORMAL HIGH (ref 11.5–15.5)
SMEAR REVIEW: UNDETERMINED
WBC MORPHOLOGY: UNDETERMINED
WBC: UNDETERMINED 10*3/uL (ref 4.0–10.5)
WBC: 23.3 10*3/uL — AB (ref 4.0–10.5)

## 2018-02-04 LAB — TYPE AND SCREEN
ABO/RH(D): B POS
Antibody Screen: NEGATIVE
UNIT DIVISION: 0
UNIT DIVISION: 0
UNIT DIVISION: 0
Unit division: 0
Unit division: 0

## 2018-02-04 LAB — BPAM RBC
BLOOD PRODUCT EXPIRATION DATE: 201909242359
Blood Product Expiration Date: 201909232359
Blood Product Expiration Date: 201909232359
Blood Product Expiration Date: 201909232359
Blood Product Expiration Date: 201909242359
ISSUE DATE / TIME: 201908261642
ISSUE DATE / TIME: 201908271106
ISSUE DATE / TIME: 201908271411
UNIT TYPE AND RH: 7300
UNIT TYPE AND RH: 7300
Unit Type and Rh: 7300
Unit Type and Rh: 7300
Unit Type and Rh: 7300

## 2018-02-04 LAB — BASIC METABOLIC PANEL
Anion gap: UNDETERMINED (ref 5–15)
Anion gap: 12 (ref 5–15)
Anion gap: 8 (ref 5–15)
BUN: UNDETERMINED mg/dL (ref 8–23)
BUN: 21 mg/dL (ref 8–23)
BUN: 19 mg/dL (ref 8–23)
CALCIUM: UNDETERMINED mg/dL (ref 8.9–10.3)
CALCIUM: 7.5 mg/dL — AB (ref 8.9–10.3)
CALCIUM: 7.5 mg/dL — AB (ref 8.9–10.3)
CHLORIDE: UNDETERMINED mmol/L (ref 98–111)
CHLORIDE: 101 mmol/L (ref 98–111)
CO2: UNDETERMINED mmol/L (ref 22–32)
CO2: 33 mmol/L — AB (ref 22–32)
CO2: 38 mmol/L — ABNORMAL HIGH (ref 22–32)
CREATININE: UNDETERMINED mg/dL (ref 0.44–1.00)
CREATININE: 0.43 mg/dL — AB (ref 0.44–1.00)
Chloride: 101 mmol/L (ref 98–111)
Creatinine, Ser: 0.61 mg/dL (ref 0.44–1.00)
GFR calc Af Amer: UNDETERMINED mL/min (ref >60)
GFR calc Af Amer: >60 mL/min (ref >60)
GFR calc non Af Amer: UNDETERMINED mL/min (ref >60)
GFR calc non Af Amer: >60 mL/min (ref >60)
GFR calc non Af Amer: >60 mL/min (ref >60)
GLUCOSE: 203 mg/dL — AB (ref 70–99)
Glucose, Bld: UNDETERMINED mg/dL (ref 70–99)
Glucose, Bld: 134 mg/dL — ABNORMAL HIGH (ref 70–99)
Potassium: UNDETERMINED mmol/L (ref 3.5–5.1)
Potassium: 2.9 mmol/L — ABNORMAL LOW (ref 3.5–5.1)
Potassium: 3.4 mmol/L — ABNORMAL LOW (ref 3.5–5.1)
SODIUM: 147 mmol/L — AB (ref 135–145)
Sodium: UNDETERMINED mmol/L (ref 135–145)
Sodium: 146 mmol/L — ABNORMAL HIGH (ref 135–145)

## 2018-02-04 LAB — MAGNESIUM
Magnesium: UNDETERMINED mg/dL (ref 1.7–2.4)
Magnesium: 1.2 mg/dL — ABNORMAL LOW (ref 1.7–2.4)

## 2018-02-04 LAB — PHOSPHORUS
Phosphorus: UNDETERMINED mg/dL (ref 2.5–4.6)
Phosphorus: 4.2 mg/dL (ref 2.5–4.6)

## 2018-02-04 MED ORDER — POTASSIUM CHLORIDE 10 MEQ/50ML IV SOLN
10.0000 meq | INTRAVENOUS | Status: AC
  Administered 2018-02-04 (×6): 10 meq via INTRAVENOUS
  Filled 2018-02-04 (×7): qty 50

## 2018-02-04 MED ORDER — HYDRALAZINE HCL 20 MG/ML IJ SOLN
10.0000 mg | Freq: Four times a day (QID) | INTRAMUSCULAR | Status: DC | PRN
  Administered 2018-02-06 – 2018-02-08 (×3): 20 mg via INTRAVENOUS
  Administered 2018-02-11: 10 mg via INTRAVENOUS
  Administered 2018-02-17 – 2018-02-27 (×11): 20 mg via INTRAVENOUS
  Administered 2018-02-28: 10 mg via INTRAVENOUS
  Administered 2018-02-28: 15 mg via INTRAVENOUS
  Administered 2018-03-01: 20 mg via INTRAVENOUS
  Administered 2018-03-01: 10 mg via INTRAVENOUS
  Administered 2018-03-01 – 2018-03-02 (×2): 20 mg via INTRAVENOUS
  Filled 2018-02-04 (×22): qty 1

## 2018-02-04 MED ORDER — FENTANYL 2500MCG IN NS 250ML (10MCG/ML) PREMIX INFUSION: 25.0000 ug/h | INTRAVENOUS | Status: DC

## 2018-02-04 MED ORDER — MAGNESIUM SULFATE 4 GM/100ML IV SOLN
4.0000 g | Freq: Once | INTRAVENOUS | Status: AC
  Administered 2018-02-04: 4 g via INTRAVENOUS
  Filled 2018-02-04: qty 100

## 2018-02-04 MED ORDER — FENTANYL BOLUS VIA INFUSION
25.0000 ug | INTRAVENOUS | Status: DC | PRN
  Administered 2018-02-05 – 2018-02-08 (×6): 25 ug via INTRAVENOUS
  Filled 2018-02-04: qty 25

## 2018-02-04 MED ORDER — INSULIN ASPART 100 UNIT/ML ~~LOC~~ SOLN
4.0000 [IU] | SUBCUTANEOUS | Status: DC
  Administered 2018-02-04 – 2018-02-21 (×77): 4 [IU] via SUBCUTANEOUS

## 2018-02-04 MED ORDER — POTASSIUM CHLORIDE 10 MEQ/50ML IV SOLN
10.0000 meq | INTRAVENOUS | Status: AC
  Administered 2018-02-04 (×6): 10 meq via INTRAVENOUS
  Filled 2018-02-04 (×5): qty 50

## 2018-02-04 MED ORDER — MAGNESIUM SULFATE 2 GM/50ML IV SOLN
2.0000 g | Freq: Once | INTRAVENOUS | Status: AC
  Administered 2018-02-04: 2 g via INTRAVENOUS
  Filled 2018-02-04: qty 50

## 2018-02-04 MED ORDER — FENTANYL 2500MCG IN NS 250ML (10MCG/ML) PREMIX INFUSION
25.0000 ug/h | INTRAVENOUS | Status: DC
  Administered 2018-02-04: 50 ug/h via INTRAVENOUS
  Administered 2018-02-06: 200 ug/h via INTRAVENOUS
  Administered 2018-02-07 – 2018-02-08 (×2): 75 ug/h via INTRAVENOUS
  Administered 2018-02-08: 100 ug/h via INTRAVENOUS
  Filled 2018-02-04 (×4): qty 250

## 2018-02-04 MED ORDER — FENTANYL CITRATE (PF) 100 MCG/2ML IJ SOLN
50.0000 ug | Freq: Once | INTRAMUSCULAR | Status: AC
  Administered 2018-02-04: 50 ug via INTRAVENOUS
  Filled 2018-02-04: qty 2

## 2018-02-04 MED ORDER — FENTANYL CITRATE (PF) 100 MCG/2ML IJ SOLN: 50.0000 ug | Freq: Once | INTRAMUSCULAR | Status: DC

## 2018-02-04 MED ORDER — FENTANYL BOLUS VIA INFUSION
50.0000 ug | INTRAVENOUS | Status: DC | PRN
  Filled 2018-02-04: qty 50

## 2018-02-04 MED ORDER — TRACE MINERALS CR-CU-MN-SE-ZN 10-1000-500-60 MCG/ML IV SOLN
INTRAVENOUS | Status: AC
  Administered 2018-02-04: 18:00:00 via INTRAVENOUS
  Filled 2018-02-04 (×2): qty 1440

## 2018-02-04 NOTE — Progress Notes (Addendum)
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  She is completed 3 cycles of chemotherapy and is now status post bilateral salpingo-oophorectomy and omentectomy with debulking on 8/22. 8/23: Difficult with analgesic control.  This was complicated by patient becoming somnolent and hypotensive after adjustment of narcotics, treated with Saline and adjustment of medications. 8/24: Still somnolent, noted to be confused.  Is fused 1 unit for hemoglobin of 6.7.  Internal medicine consulted for acute encephalopathy also postoperative acute kidney injury.  8/25: Remains encephalopathic.  Hemoglobin improved posttransfusion.  Progress note commenting on dyspnea. Had episode of vomiting shortly after arriving in [place] unit.  More hypertensive and tachycardic. 8/26-8/30:8/26 transferred to ICU w/ concern for perforation w/ free air noted.  Imipenem started.  Went for exploratory laparotomy with washout.  No significant contamination identified, presumed spontaneously closed microperf. .  Returned with abdominal drains postoperative chest x-ray showing evidence of aspiration pneumonia; mixed respiratory and metabolic acidosis & in septic shock. 8/27: weaning pressors. Hgb dropped from 8.8 to 7.4; got 2 units of blood,  febrile, added. vancomycin.  Neupogen given for neutropenia. 8/28: Pressors off/shock resolved. NAG acidosis and Lactic acidosis resolved.  FiO2/PEEP down, attempted PSV..  WBC count up.  Started TPN. 8/29: FiO2 and PEEP requirements increased, had wean these down to 40/5 now 60/10.  Chest x-ray with worsening right-sided airspace disease likely reflecting de-recruitment. Delirium remains significant obstacle.  Beta-blocker, and clonidine patch added  for hypertension 8/30: Diuresed 4.5 L the day prior with Lasix.  Little more awake.  X-ray improved.  Vancomycin discontinued.  Continuing diuresis, RASS goal changed to -1, dropping PEEP to 8.  Resolved hospital Problems  Septic shock 8/28 AKI  Assessment & Plan:   Sepsis,  fever  with neutropenia: peritonitis (microperforation), complicated by aspiration pneumonia.  No significant fever spikes and white cells have improved Plan Day #4 vancomycin and Eraxis, day #5 meropenem Discontinue vancomycin today   Status post exploratory laparotomy for free air on 8/26 ; following bilateral salpingo-oophorectomy, omentectomy, and debulking surgery on 8/22 Plan Continue nothing by mouth/gastric route Continue routine wound care wound drain care Continue TPN  Acute hypoxic respiratory failure in the setting of aspiration pneumonia/ARDS Portable chest x-ray reviewed for today 8/30: Improved aeration with right greater than left airspace disease.  Comparing to day prior aeration looks better Plan Decrease PEEP to 8 Continue hypertonic saline Continue pulse oximetry RASS goal -1 Continue aggressive diuresis as long as BUN, creatinine, and blood pressure allow. Antibiotics as above Follow-up x-ray a.m.  Sinus tachycardia, hypertension, and grade 1 diastolic dysfunction with minimal troponin I bump.  Echo showed hyperdynamic ejection fraction with no wall motion abnormality -Increased beta-blockade, added clonidine patch both on 8/29 Plan Continue clonidine patch No change in beta-blockade Continue diuresis Continue telemetry monitoring  Acute metabolic encephalopathy.  Suspect this is multifactorial and secondary to sepsis as well as residual narcotics and metabolic acidosis, this remains a significant barrier. -More awake today as of 8/30 Plan PAD protocol RASS goal -1 Continue Precedex and PRN fentanyl Family updated on delirium interventions  At risk for recurrent AKI.   Stable Plan Continue IV diuresis Strict intake output Follow-up chemistry  At risk for fluid and electrolyte imbalance: Anasarca, hypomagnesemia, hypokalemia, mild hypernatremia  she was 9.1 L positive, she is now down to 7.2 following aggressive diuresis. Plan Continuing KVO fluids of D5 water Replace Mg, K and recheck this afternoon as we will cont on-going lasix Cont strict intake and output   Postoperative anemia.  Does have coffee ground gastric tube output, this seems to have improved Hemoglobin stable Plan Trend CBC  Leukocytosis.  Initially was neutropenic however did receive Granix on 8/28 per oncology Plan Trend cbc   Thrombocytopenia-->stable in 50s Plan SCDs F/u am cbc Add back LMWH when PLTs rebound   Protein calorie malnutrition in the setting of cancer Plan TPN  Hyperglycemia Plan ssi   Stage IV ovarian cancer, now status post bilateral salpingo-oophorectomy omentectomy and debulking surgery on 8/22 Plan Per GYN/Onc   Best practice/Goals of care/disposition.   DVT prophylaxis: LMWH-->SCD (8/26); continuing SCDs as of 8/29 given thrombocytopenia GI prophylaxis: NA-->H2B, changed to PPI on 8/27 Diet: NPO, starting TPN on 8/28 Mobility:BR Code Status: Full Code Family Communication: husband and son updated   Disposition / Summary of Today's Plan 02/04/18   Working diagnosis is severe sepsis secondary to aspiration pneumonia, and microperforation with peritonitis.  Her primary obstacles at this point remain delirium, PNA w/ resolving ARDS, and volume overload.  For today the goal will be to continue aggressive diuresis, start peeling back antimicrobials, changing RASS goal to -1.  Will PEEP down to 8, but not sure she is ready to wean just yet.  She looks a little better today than when compared to 8/29  Consultants:  Internal medicine 8/25 Critical care 8/26 General surgery 8/26 GI medicine 8/27: They did not feel she had overt GI bleed.  Recommended  prophylaxis with PPI.  Signed off. Procedures: bilateral salpingo-nephrectomy omentectomy and debulking surgery on 8/22 8/26: Intubation 8/26: Exploratory laparotomy, washout, placement of right upper quadrant x2 JP drain and left lower quadrant JP drain  Significant Diagnostic Tests: CT abdomen 8/26: Hemic bowel with portal venous gas and bowel pneumatosis with large amount of pneumoperitoneum.  Unclear as to site of perforation.  Multiple pockets of air adjacent to the sigmoid colon.  Enhancement of the peritoneum concerning for peritonitis.  There was aortic arterial sclerosis. Echocardiogram 8/27: Particular ejection fraction 65 to 70%.  She has moderate LVH, and hyperdynamic LV function.  There is grade 1 diastolic dysfunction.  Trivial MR.  Normal left atrial size.  The IV C was not visualized  Micro Data: Blood Culture times two 8/26>>> Sputum 8/27: Abundant yeast, few Candida albicans Antimicrobials:  Meropenem 8/26>>> Vancomycin 8/27>>> Eraxis 8/27>>> 8/30 Subjective  Appears comfortable Objective    Examination: General: 72 year old female, she is currently sedated on Precedex infusion, she is more awake today than when compared to prior exams.  Moves upper extremities spontaneously, but not purposeful as of yet. HEENT some scleral edema, sclera remain mildly icteric, no JVD.  Orally intubated. Pulmonary: Coarse breath sounds, equal chest rise, no accessory use currently on mechanical ventilation. Cardiac: Regular rate and rhythm without current murmur rub or gallop Abdomen: Still with abdominal edema, dressing is clean dry and intact, JP drains with clear serous fluid Hypoactive bowel sounds GU: Clear yellow Neuro: More awake, still not purposeful but does spontaneously move extremities. Remedies: Diffuse anasarca, warm, dry, brisk capillary refill, strong pulses  Blood pressure 127/62, pulse 88, temperature 100 F (37.8 C), temperature source Axillary, resp. rate  (Abnormal) 23, height 5\' 3"  (1.6 m), weight 66.2 kg, SpO2 100 %. CVP:  [8 mmHg-77 mmHg] 77 mmHg  Vent Mode: PRVC FiO2 (%):  [40 %] 40 % Set Rate:  [20 bmp] 20 bmp Vt Set:  [330 mL] 330 mL PEEP:  [10 cmH20] 10 cmH20 Plateau Pressure:  [11 cmH20-20 cmH20] 20 cmH20   Intake/Output Summary (Last 24 hours) at 02/04/2018 0846 Last data filed at 02/04/2018 0533 Gross per 24 hour  Intake 1390.87 ml  Output 5235 ml  Net -3844.13 ml   Filed Weights   02/02/18 1111 02/03/18 0500 02/04/18 0500  Weight: 71.6 kg 70.1 kg 66.2 kg     Labs   CBC: Recent Labs  Lab 01/29/18 2113  02/01/18 0130 02/02/18 0455 02/03/18 0410 02/04/18 0407 02/04/18 0623  WBC 3.5*   < > 1.6* 27.9* 36.7* SPECIMEN CONTAMINATED, UNABLE TO PERFORM TEST(S). 23.3*  NEUTROABS 2.6  --   --  26.2 34.8* SPECIMEN CONTAMINATED, UNABLE TO PERFORM TEST(S). 20.7*  HGB 8.6*   < > 7.4* 11.9* 12.2 SPECIMEN CONTAMINATED, UNABLE TO PERFORM TEST(S). 11.1*  HCT 27.1*   < > 22.4* 35.0* 35.3* SPECIMEN CONTAMINATED, UNABLE TO PERFORM TEST(S). 33.9*  MCV 73.4*   < > 73.4* 76.8* 76.2* SPECIMEN CONTAMINATED, UNABLE TO PERFORM TEST(S). 79.0  PLT 88*   < > 55* 62* 57* SPECIMEN CONTAMINATED, UNABLE TO PERFORM TEST(S). 52*   < > = values in this interval not displayed.   Basic Metabolic Panel: Recent Labs  Lab 01/29/18 2113  02/03/18 0410 02/03/18 1130 02/03/18 1733 02/04/18 0407 02/04/18 0612  NA  --    < > 147* 146* 148* SPECIMEN CONTAMINATED, UNABLE TO PERFORM TEST(S). 146*  K  --    < > 3.6 3.5 3.4* SPECIMEN CONTAMINATED, UNABLE TO PERFORM TEST(S). 2.9*  CL  --    < > 109 107 105 SPECIMEN CONTAMINATED, UNABLE TO PERFORM TEST(S). 101  CO2  --    < > 30 30 34* SPECIMEN CONTAMINATED, UNABLE TO PERFORM TEST(S). 33*  GLUCOSE  --    < > 208* 193* 194* SPECIMEN CONTAMINATED, UNABLE TO PERFORM TEST(S). 203*  BUN  --    < > 23 22 23  SPECIMEN CONTAMINATED, UNABLE TO PERFORM TEST(S). 21  CREATININE  --    < > 0.70 0.66 0.66 SPECIMEN  CONTAMINATED, UNABLE TO PERFORM TEST(S). 0.61  CALCIUM  --    < > 7.6* 7.2* 7.6* SPECIMEN CONTAMINATED, UNABLE TO PERFORM TEST(S). 7.5*  MG 2.0  --  1.3*  --   --  SPECIMEN CONTAMINATED, UNABLE TO PERFORM TEST(S). 1.2*  PHOS 2.9  --  2.9  --   --  SPECIMEN CONTAMINATED, UNABLE TO PERFORM TEST(S). 4.2   < > = values in this interval not displayed.   GFR: Estimated Creatinine Clearance: 58.1 mL/min (by C-G formula based on SCr of 0.61 mg/dL). Recent Labs  Lab 01/29/18 2130  01/31/18 0800  02/01/18 0130 02/01/18 2027 02/02/18 0455 02/03/18 0410 02/04/18 0407 02/04/18 0623  WBC  --    < >  --    < > 1.6*  --  27.9* 36.7* SPECIMEN CONTAMINATED, UNABLE TO PERFORM TEST(S). 23.3*  LATICACIDVEN 1.4  --  2.7*  --  3.3* 2.4*  --   --   --   --    < > = values in this interval not displayed.   Liver Function Tests: Recent Labs  Lab 01/29/18 1701 02/02/18 0455 02/03/18 0410  AST 32 32 31  ALT 20 18 19   ALKPHOS 64 102 143*  BILITOT 1.2 0.9 1.1  PROT 5.7* 4.2* 4.6*  ALBUMIN  3.0* 1.8* 1.7*   No results for input(s): LIPASE, AMYLASE in the last 168 hours. Recent Labs  Lab 01/29/18 2130  AMMONIA 18   ABG    Component Value Date/Time   PHART 7.454 (H) 02/03/2018 0345   PCO2ART 44.9 02/03/2018 0345   PO2ART 52.6 (L) 02/03/2018 0345   HCO3 31.0 (H) 02/03/2018 0345   TCO2 17 (L) 01/31/2018 1728   ACIDBASEDEF 0.4 02/01/2018 0432   O2SAT 86.9 02/03/2018 0345    Coagulation Profile: Recent Labs  Lab 01/31/18 0850  INR 1.36   Cardiac Enzymes: Recent Labs  Lab 01/29/18 2113  01/31/18 0850 01/31/18 2021 02/01/18 0130 02/01/18 0850 02/01/18 1605  CKTOTAL 197  --   --   --   --   --   --   TROPONINI <0.03   < > <0.03 0.03* <0.03 0.04* 0.04*   < > = values in this interval not displayed.   HbA1C: No results found for: HGBA1C CBG: Recent Labs  Lab 02/03/18 1657 02/03/18 1957 02/03/18 2341 02/04/18 0348 02/04/18 0736  GLUCAP 156* 176* 240* 215* [place] ACNP-BC [place] Pager # [phone] OR # [phone] if no answer

## 2018-02-04 NOTE — Progress Notes (Signed)
[facility] NOTE   Pharmacy Consult for TPN Indication: bowel perforation  Patient Measurements: Height: '5\' 3"'$  (160 cm) Weight: 145 lb 15.1 oz (66.2 kg) IBW/kg (Calculated) : 52.4 TPN AdjBW (KG): 58.3 Body mass index is 25.85 kg/m.  Insulin Requirements: 30 units resistant SSI (no meds PTA)  Current Nutrition: NPO   IVF: D5W @ 10 ml/hr - Also on continuous Precedex at 21.5 ml/hr - Furosemide 60 mg IV q8h (aggressive diuresis)  Central access: Implanted port 10/14/17, triple lumen PICC 02/01/18 TPN start date: 02/02/2018  ASSESSMENT                                                                                                          HPI: Pt is a 72 year old with ovarian cancer status post bilateral salpingo-oophorectomy and omentectomy with debulking on 8/22. Ex lap on 8/26. Pt remains in ICU critically ill on pressors, vent support.  She has had poor appetite and poor PO intakes since admission and for unknown amount of time PTA and will be at risk for refeeding syndrome.  Significant events:  - 8/27 Start TPN. Hold lipids for first  7 days while pt in ICU   Today:   Glucose - CBG range 155-240; elevated and covering with SSI. No history of DM.  Electrolytes - Na 146 (hypoNa TPN formula may help with elevated Na, noted plans for lasix diuresis d/t volume overload), K 2.9 and Mag 1.2 (low and decreased despite replacement, ongoing Lasix q8h).  Other WNL including Corr Ca 9.34, Phos.  Renal - Scr 0.6  I/O: currently +7.2 L over course of this admission.  Increasing lasix and aiming for negative volume status. I/O - 4.5 L on 8/29.   LFTs - Alk phos up to 143.  AST/ALT and Tbili WNL.  TGs - 117 ( 8/26) Propofol d/c on 8/28.  Prealbumin - 6/2 ( 8/25)   NUTRITIONAL GOALS                                                                                             RD recs (8/27): Kcal:  4128, Protein:  87-99 grams (1.5-1.7 grams/kg), Fluid:   >/= 1.5 L/day  Based on published guidelines, while the patient meets ICU status the initiation of lipids is being delayed for 7 days or until transition out of ICU. Planned start date for lipids is 9/4.  Goal will be to meet 100% of the patient's protein needs and approximately 80% of their caloric needs.  Clinimix 5/20 @ 80 mL/hr to provide 96 grams protein (100% of goal) and 1689 kcal (100% of goal)  Glucose infusion rate will be 3.8 mg/kg/min (Maximum 5  mg/kg/min)   PLAN  Now: KCl 10 meq / 74m IV (central line) x6 runs per MD, additional KCl 10 meq / 590mIV (central line) x6 runs this afternoon. (total 120 mEq) Mag 2g IV x1 dose per MD, additional Mag 4g IV x1 dose this afternoon. (total 6g)                                                                                                                         At 1800 today:  Continue Clinimix E 5/20 at 60 ml/hr.  Do not advance rate today given electrolyte abnormalities  Monitor I/O status, discussed titration of TPN with PeLaurey ArrowP.  OK to titrate, will diurese as needed for goal negative volume.    TPN to contain standard multivitamins and trace elements.  Hold 20% fat emulsion while pt is ICU status x 7 days.  Start on 9/4.  Continue resistant  SSI q4h.   TPN lab panels on Mondays & Thursdays.  Daily BMET, Mag, Phos x3 days    F/u daily.   ChGretta ArabharmD, BCPS Pager [phone]/30/2019 7:29 AM

## 2018-02-04 NOTE — Progress Notes (Signed)
Pt had 17 beat run of vtach. E-link notified. IV potassium (potassium 2.9 with daily labs) and magnesium (magnesium 1.2 with daily labs) ordered. Will continue to monitor patient closely at this time.

## 2018-02-04 NOTE — Progress Notes (Signed)
Pharmacy Antibiotic Note  Jo Parker is a 72 y.o. female admitted on 01/27/2018 with hx of ovarian cancer for bilateral salpingo-oophorectomy and omentectomy with debulking on 8/22.  She now has developed suspected intra-abdominal infection.  CT abdomen results worrisome for ischemic bowel with portal venous gas, bowel pneumatosis, and large amount of pneumoperitoneum without clear source of perforation.  Pharmacy has been consulted for meropenem dosing on 8/26 and added vancomycin on 8/27 for pneumonia.  Anidulafungin added for empiric coverage given concern for intra-abdominal infection, ongoing fever.  SCr bumped to 1.14 on 8/27 PM, but now returned to baseline, SCr 0.61 Granix x1 dose on 8/27.  Quick increase in WBC 1.6 >> 27.9 > 36.7 > 23.3 Tm 101.1  Plan:  D/C vancomycin today.  Continue Meropenem 1g IV q8h  Continue Anidulafungin 200mg  IV x1 followed by 100mg  IV q24h  Follow up renal fxn, culture results, and clinical course.   Height: 5\' 3"  (160 cm) Weight: 145 lb 15.1 oz (66.2 kg) IBW/kg (Calculated) : 52.4  Temp (24hrs), Avg:99.5 F (37.5 C), Min:98.4 F (36.9 C), Max:101.1 F (38.4 C)  Recent Labs  Lab 01/29/18 2130  01/31/18 0800  02/01/18 0130  02/01/18 2027 02/02/18 0455  02/03/18 0410 02/03/18 1130 02/03/18 1733 02/04/18 0407 02/04/18 0612 02/04/18 0623  WBC  --    < >  --    < > 1.6*  --   --  27.9*  --  36.7*  --   --  SPECIMEN CONTAMINATED, UNABLE TO PERFORM TEST(S).  --  23.3*  CREATININE  --    < >  --    < >  --    < >  --  0.96   < > 0.70 0.66 0.66 SPECIMEN CONTAMINATED, UNABLE TO PERFORM TEST(S). 0.61  --   LATICACIDVEN 1.4  --  2.7*  --  3.3*  --  2.4*  --   --   --   --   --   --   --   --    < > = values in this interval not displayed.    Estimated Creatinine Clearance: 58.1 mL/min (by C-G formula based on SCr of 0.61 mg/dL).    Allergies  Allergen Reactions  . Oxycodone     confusion    Antimicrobials this admission: 8/26  Meropenem>> 8/27 Vancomycin >> 8/30 8/27 Anidulafungin >>   Dose adjustments this admission:   Microbiology results: 8/24 UCx: NGF 8/25 MRSA PCR: negative 8/26 BCx: NGTD 8/27 Trach asp: few candida albicans  Thank you for allowing pharmacy to be a part of this patient's care.  Gretta Arab PharmD, BCPS Pager [phone] 02/04/2018 7:30 AM

## 2018-02-04 NOTE — Consult Note (Signed)
[facility] Nurse wound consult note Reason for Consult:Deep tissue injury to right anterior ear lobe.  Blood filled blister present Wound type:DTI Pressure Injury POA: No Measurement: 1 cm x 0.3 cm intact blood filled blister Wound MRA:[id number] Drainage (amount, consistency, odor) none Periwound:intact Dressing procedure/placement/frequency:Continue foam dressing and offloading as ordered. Re-consult if begins to drain. Will not follow at this time.  Please re-consult if needed.  Domenic Moras RN BSN [place] Pager [phone]

## 2018-02-04 NOTE — Progress Notes (Addendum)
Gynecologic Oncology Follow up  72 year old female diagnosed with Stage IV ovarian cancer who received four cycles of neoadjuvant chemotherapy with carboplatin and taxol with fourth cycle completed on 12/20/17 .  She underwent interval debulking surgery with Dr. Skeet Latch on 01/27/18 consisting of an exploratory laparotomy infra-gastric omentectomy bilateral salpingo-oophorectomy radical optimal tumor debulking, resection of anterior abdominal wall.  Her post-operative course was complicated by altered mental status noted on POD1 with improvement after Narcan administration but with return of symptoms, acute renal injury, tachycardia, dyspnea (CT chest performed 01/30/18-neg for PE), anemia.  Her AKI and altered mental status appeared to improve on 01/30/18 in the am but symptoms worsened the evening of 01/30/18 with episodes of emesis, tachycardia, hypertension, and confusion. A CT scan on 01/31/18 revealed findings most consistent with ischemic bowel with portal venous gas and bowel pneumatosis and large amount of pneumoperitoneum with concerns for peritonitis.  She was taken back to the OR on 01/31/18 with operative findings of no obvious bowel perforation or evidence of ischemia. It was felt that aspiration most likely occurred during intubation.        Subjective: Patient intubated. Husband and son at the bedside.     Objective: Vital signs in last 24 hours: Temp:  [97.9 F (36.6 C)-100 F (37.8 C)] 97.9 F (36.6 C) (08/30 1156) Pulse Rate:  [80-99] 85 (08/30 1300) Resp:  [17-32] 20 (08/30 1300) BP: (102-149)/(57-84) 134/69 (08/30 1300) SpO2:  [97 %-100 %] 100 % (08/30 1301) Arterial Line BP: (101-151)/(49-70) 126/59 (08/29 1600) FiO2 (%):  [40 %-50 %] 40 % (08/30 1301) Weight:  [145 lb 15.1 oz (66.2 kg)] 145 lb 15.1 oz (66.2 kg) (08/30 0500) Last BM Date: 02/02/18  Intake/Output from previous day: 08/29 0701 - 08/30 0700 In: 1863.8 [I.V.:1026.4; IV Piggyback:837.4] Out: 6295 [Urine:5550;  Emesis/NG output:170; Drains:215]  Physical Examination: General: no distress and intubated Resp: coarse breath sounds bilaterally, rhonchi noted Cardio: heart rate at 113, regular rhythm GI: incision: midline incision with op site dressing in place, stainmarked, no active drainage noted and abdomen slightly distended, hypoactive bowel sounds Extremities: generalized edema in all extrem JP drain x3 in the abdomen charged. Minimal serous drainage noted in all three. Foley with clear, light yellow urine   Labs: WBC/Hgb/Hct/Plts:  23.3/11.1/33.9/52 (08/30 2841) BUN/Cr/glu/ALT/AST/amyl/lip:  21/0.61/--/--/--/--/-- (08/30 3244)   Assessment: 72 y.o. s/p Procedure(s): Interval debulking surgery with Dr. Skeet Latch on 01/27/18 consisting of an exploratory laparotomy infra-gastric omentectomy bilateral salpingo-oophorectomy radical optimal tumor debulking, resection of anterior abdominal wall.  Reoperation of EXPLORATORY LAPAROTOMY, ABDOMINAL WASHOUT on 01/31/18: critically ill  Heme: Post-operative anemia. Hgb 11.1 and Hct 33.9 this am. S/P transfusion of 2 units PRBCs.  PLT 52 this am.   ID: WBC 23.3 this am.  Granix received 02/01/18 per Dr. Alvy Bimler.  On Vancomycin IV and Anidulafungin initiated 02/01/18. Meropenum IV started 01/31/18. Presumed sepsis from gastrointestinal source but no evidence of perforation after exp lap on 01/31/18. Blood cultures from 01/31/18 with no growth. Tracheal aspirate from 02/01/18 resulting few gram positive rods. Plan for discontinuation of Vancomycin today per CC.   CV: Tachycardia and hypertension improved from 02/03/18. Troponin 0.04 at 16:00 on 02/01/18. Addition of beta blocker per CC. Monitoring to continue.  Resp: ARDS in setting of aspiration pneumonia.  Chest 1 view this am: Stable appearance of the chest.  No new focal abnormality is noted. Reviewed imaging with PB, NP. PEEP to 8 today per CC.  GI:  Tolerating po: No: intubated. Protonix ordered. GI  consultation  appreciated. CT AP on 01/31/18 with findings above.  No obvious perforation seen on re-operation on 01/31/18. NG with brown output (150cc in canister). TPN initiated 02/02/18.  GU: AKI post-op resolved. Creatinine 0.61 this am. Foley in place.  Plan for continued diuresis today per CC.   FEN: Cmet this am.  K 2.9, Ca+ 7.5. Replacement today per CC. Continue to monitor. Hypokalemia acidosis resolved per CC note.  Prophylaxis: SCDs in place. Anticoagulation on hold due to persistent thrombocytopenia from sepsis. No active bleeding and H&H stable.  Plan: Continue plan per Critical Care. Dr. Gerarda Fraction to see patient today Plan to hold on removal of JP drains at this time per Dr. Denman George Maintain NPO status. Continue TPN per pharmacy. Strict intake and output  Melissa D Cross 02/04/2018, 1:38 PM   Patient seen and examined this afternoon. LLQ JP drain leaking around insertion site. I stripped all the drains. The LLQ had a small clot and now has serous fluid draining from it. The other two had minimal serosanguinous with small clots in the tubing. No drains showing any enteric contents or air.  Weaning per ICU. Continue TPN for now.  Right earlobe blister per wound care.

## 2018-02-04 NOTE — Progress Notes (Signed)
[facility] Progress Note Patient Name: Jo Parker DOB: 1946/04/20 MRN: [id number]   Date of Service  02/04/2018  HPI/Events of Note  17 beat run of Vtach. Patient hemodynamically stable  eICU Interventions  Replace Mg and K     Intervention Category Intermediate Interventions: Arrhythmia - evaluation and management  Sharia Reeve 02/04/2018, 6:48 AM

## 2018-02-04 NOTE — Care Management Note (Signed)
Case Management Note  Patient Details  Name: Jo Parker MRN: [id number] Date of Birth: 10-09-1945  Subjective/Objective:                  Date of Service  02/04/2018  HPI/Events of Note  17 beat run of Vtach. Patient hemodynamically stable  eICU Interventions  Replace Mg and K  Physical examination WD female, sedated, Movement with stimulation, intubated.  Husband and son are present in the room. O.  BP (!) 165/92   Pulse (!) 113   Temp (!) 101.1 F (38.4 C) (Oral)   Resp (!) 27   Ht 5\' 3"  (1.6 m)   Wt 154 lb 8.7 oz (70.1 kg)   SpO2 98%   BMI 27.38 kg/m    Intake/Output Summary (Last 24 hours) at 02/03/2018 1050 Last data filed at 02/03/2018 0800    Gross per 24 hour  Intake 1879 ml  Output 5730 ml  Net -3851 ml    Vent Mode: PRVC FiO2 (%):  [40 %-50 %] 45 % Set Rate:  [20 bmp-24 bmp] 20 bmp Vt Set:  [330 mL] 330 mL PEEP:  [5 cmH20-10 cmH20] 10 cmH20 Plateau Pressure:  [6 cmH20-24 cmH20] 6 cmH20    Action/Plan: Return to top of Hospital-Acquired Pneumonia and Atelectasis: Common Complications and Conditions - ISC  Extended stay beyond goal length of stay for primary condition may be needed until ALL of the following are present: ? Hemodynamic stability ? Oxygen requirement absent, at baseline, or supplementation needed can be given at next level of care remains on full vent support ? Mental status at baseline/ sedated ? Uncompensated respiratory acidosis absent ? Tachypnea absent vent controlled ? Antimicrobial treatment at next level of care arranged ? Aspiration addressed (if present)[R] ? Diet or tube feedings tolerated =npo ?  ? Suctioning, pulmonary toilet, or other therapy performable at lower level of care= vent ? Fever absent, improved, or manageable at lower level of care temp-100.6 ? Medical comorbidities manageable at lower level of care   Expected Discharge Date:  01/29/18               Expected Discharge Plan:  Home/Self  Care  In-House Referral:     Discharge planning Services  CM Consult  Post Acute Care Choice:    Choice offered to:     DME Arranged:    DME Agency:     HH Arranged:    HH Agency:     Status of Service:  In process, will continue to follow  If discussed at Long Length of Stay Meetings, dates discussed:    Additional Comments:  Leeroy Cha, RN 02/04/2018, 8:49 AM

## 2018-02-05 ENCOUNTER — Inpatient Hospital Stay (HOSPITAL_COMMUNITY): Payer: Medicare PPO

## 2018-02-05 DIAGNOSIS — L8990 Pressure ulcer of unspecified site, unspecified stage: Secondary | ICD-10-CM

## 2018-02-05 LAB — CBC WITH DIFFERENTIAL/PLATELET
BASOS PCT: 0 %
Basophils Absolute: 0 10*3/uL (ref 0.0–0.1)
EOS ABS: 0.4 10*3/uL (ref 0.0–0.7)
EOS PCT: 3 %
HEMATOCRIT: 34.2 % — AB (ref 36.0–46.0)
Hemoglobin: 10.9 g/dL — ABNORMAL LOW (ref 12.0–15.0)
LYMPHS PCT: 10 %
Lymphs Abs: 1.4 10*3/uL (ref 0.7–4.0)
MCH: 26.1 pg (ref 26.0–34.0)
MCHC: 31.9 g/dL (ref 30.0–36.0)
MCV: 82 fL (ref 78.0–100.0)
MONO ABS: 0.9 10*3/uL (ref 0.1–1.0)
Monocytes Relative: 6 %
NEUTROS ABS: 11.6 10*3/uL — AB (ref 1.7–7.7)
Neutrophils Relative %: 81 %
PLATELETS: 65 10*3/uL — AB (ref 150–400)
RBC: 4.17 MIL/uL (ref 3.87–5.11)
RDW: 22.4 % — ABNORMAL HIGH (ref 11.5–15.5)
WBC: 14.3 10*3/uL — ABNORMAL HIGH (ref 4.0–10.5)

## 2018-02-05 LAB — CULTURE, BLOOD (ROUTINE X 2)
Culture: NO GROWTH
Culture: NO GROWTH
SPECIAL REQUESTS: ADEQUATE
Special Requests: ADEQUATE

## 2018-02-05 LAB — GLUCOSE, CAPILLARY
GLUCOSE-CAPILLARY: 143 mg/dL — AB (ref 70–99)
GLUCOSE-CAPILLARY: 131 mg/dL — AB (ref 70–99)
GLUCOSE-CAPILLARY: 141 mg/dL — AB (ref 70–99)
Glucose-Capillary: 131 mg/dL — ABNORMAL HIGH (ref 70–99)
Glucose-Capillary: 133 mg/dL — ABNORMAL HIGH (ref 70–99)
Glucose-Capillary: 142 mg/dL — ABNORMAL HIGH (ref 70–99)
Glucose-Capillary: 145 mg/dL — ABNORMAL HIGH (ref 70–99)

## 2018-02-05 LAB — BASIC METABOLIC PANEL
Anion gap: 15 (ref 5–15)
BUN: 26 mg/dL — AB (ref 8–23)
CHLORIDE: 89 mmol/L — AB (ref 98–111)
CO2: 39 mmol/L — ABNORMAL HIGH (ref 22–32)
CREATININE: 0.61 mg/dL (ref 0.44–1.00)
Calcium: 7.7 mg/dL — ABNORMAL LOW (ref 8.9–10.3)
GFR calc Af Amer: >60 mL/min (ref >60)
GFR calc non Af Amer: >60 mL/min (ref >60)
Glucose, Bld: 144 mg/dL — ABNORMAL HIGH (ref 70–99)
POTASSIUM: 3.3 mmol/L — AB (ref 3.5–5.1)
SODIUM: 143 mmol/L (ref 135–145)

## 2018-02-05 LAB — BASIC METABOLIC PANEL WITH GFR
Anion gap: 13 (ref 5–15)
BUN: 23 mg/dL (ref 8–23)
CO2: 40 mmol/L — ABNORMAL HIGH (ref 22–32)
Calcium: 7.4 mg/dL — ABNORMAL LOW (ref 8.9–10.3)
Chloride: 93 mmol/L — ABNORMAL LOW (ref 98–111)
Creatinine, Ser: 0.62 mg/dL (ref 0.44–1.00)
GFR calc Af Amer: >60 mL/min
GFR calc non Af Amer: >60 mL/min
Glucose, Bld: 166 mg/dL — ABNORMAL HIGH (ref 70–99)
Potassium: 4.3 mmol/L (ref 3.5–5.1)
Sodium: 146 mmol/L — ABNORMAL HIGH (ref 135–145)

## 2018-02-05 LAB — MAGNESIUM
MAGNESIUM: 2.1 mg/dL (ref 1.7–2.4)
Magnesium: 1.6 mg/dL — ABNORMAL LOW (ref 1.7–2.4)

## 2018-02-05 LAB — PHOSPHORUS: Phosphorus: 3.9 mg/dL (ref 2.5–4.6)

## 2018-02-05 MED ORDER — POTASSIUM CHLORIDE 10 MEQ/50ML IV SOLN
10.0000 meq | INTRAVENOUS | Status: AC
  Administered 2018-02-05 (×4): 10 meq via INTRAVENOUS
  Filled 2018-02-05 (×4): qty 50

## 2018-02-05 MED ORDER — ACETAMINOPHEN 160 MG/5ML PO SOLN: 650.0000 mg | Freq: Once | ORAL | Status: DC

## 2018-02-05 MED ORDER — MAGNESIUM SULFATE 4 GM/100ML IV SOLN
4.0000 g | Freq: Once | INTRAVENOUS | Status: AC
  Administered 2018-02-05: 4 g via INTRAVENOUS
  Filled 2018-02-05: qty 100

## 2018-02-05 MED ORDER — POTASSIUM CHLORIDE 10 MEQ/50ML IV SOLN
10.0000 meq | INTRAVENOUS | Status: AC
  Administered 2018-02-05 – 2018-02-06 (×4): 10 meq via INTRAVENOUS
  Filled 2018-02-05 (×4): qty 50

## 2018-02-05 MED ORDER — TRACE MINERALS CR-CU-MN-SE-ZN 10-1000-500-60 MCG/ML IV SOLN
INTRAVENOUS | Status: AC
  Administered 2018-02-05: 17:00:00 via INTRAVENOUS
  Filled 2018-02-05 (×2): qty 1440

## 2018-02-05 MED ORDER — SODIUM CHLORIDE 0.9 % IV BOLUS
500.0000 mL | Freq: Once | INTRAVENOUS | Status: AC
  Administered 2018-02-05: 500 mL via INTRAVENOUS

## 2018-02-05 MED ORDER — DEXMEDETOMIDINE HCL IN NACL 200 MCG/50ML IV SOLN
0.0000 ug/kg/h | INTRAVENOUS | Status: DC
  Administered 2018-02-06: 0.7 ug/kg/h via INTRAVENOUS
  Administered 2018-02-06: 0.751 ug/kg/h via INTRAVENOUS
  Administered 2018-02-06: 1.2 ug/kg/h via INTRAVENOUS
  Filled 2018-02-05: qty 50
  Filled 2018-02-05: qty 700
  Filled 2018-02-05 (×2): qty 50

## 2018-02-05 MED ORDER — ACETAMINOPHEN 650 MG RE SUPP
650.0000 mg | Freq: Once | RECTAL | Status: AC
  Administered 2018-02-05: 650 mg via RECTAL
  Filled 2018-02-05: qty 1

## 2018-02-05 NOTE — Progress Notes (Addendum)
13 beat run of V.tach, Dr. Lamonte Sakai notified. Instructed to hold future lasix and draw BMET as well as Mg levels

## 2018-02-05 NOTE — Progress Notes (Signed)
Gynecologic Oncology Follow up  72 year old female diagnosed with Stage IV ovarian cancer who received four cycles of neoadjuvant chemotherapy with carboplatin and taxol with fourth cycle completed on 12/20/17 .  She underwent interval debulking surgery with Dr. Skeet Latch on 01/27/18 consisting of an exploratory laparotomy infra-gastric omentectomy bilateral salpingo-oophorectomy radical optimal tumor debulking, resection of anterior abdominal wall.  Her post-operative course was complicated by altered mental status noted on POD1 with improvement after Narcan administration but with return of symptoms, acute renal injury, tachycardia, dyspnea (CT chest performed 01/30/18-neg for PE), anemia.  Her AKI and altered mental status appeared to improve on 01/30/18 in the am but symptoms worsened the evening of 01/30/18 with episodes of emesis, tachycardia, hypertension, and confusion. A CT scan on 01/31/18 revealed findings most consistent with ischemic bowel with portal venous gas and bowel pneumatosis and large amount of pneumoperitoneum with concerns for peritonitis.  She was taken back to the OR on 01/31/18 with operative findings of free air and large volume ascites/fluid, but unable to visualize site of bowel perforation, no evidence of ischemia. It was felt that aspiration most likely occurred during intubation.        Subjective: Patient intubated. Husband and son at the bedside.     Objective: Vital signs in last 24 hours: Temp:  [97.9 F (36.6 C)-103 F (39.4 C)] 103 F (39.4 C) (08/31 0834) Pulse Rate:  [80-114] 98 (08/31 1000) Resp:  [20-27] 26 (08/31 1000) BP: (123-232)/(59-124) 170/77 (08/31 1000) SpO2:  [97 %-100 %] 99 % (08/31 1000) FiO2 (%):  [40 %] 40 % (08/31 0834) Weight:  [146 lb 13.2 oz (66.6 kg)] 146 lb 13.2 oz (66.6 kg) (08/31 0530) Last BM Date: 02/02/18  Intake/Output from previous day: 08/30 0701 - 08/31 0700 In: 2492.2 [I.V.:1494.9; NG/GT:20; IV Piggyback:727.3] Out: 0277  [AJOIN:8676; Emesis/NG output:140; Drains:188]  Physical Examination: General: no distress and intubated Resp: coarse breath sounds bilaterally, rhonchi noted Cardio: heart rate at 105, regular rhythm GI: incision: midline incision with gauze dressings, clean, no active drainage noted and abdomen slightly distended, hypoactive bowel sounds Extremities: generalized edema in all extrem JP drain x3 in the abdomen charged. Minimal serous drainage noted in all three. Foley with clear, light yellow urine   Labs: WBC/Hgb/Hct/Plts:  14.3/10.9/34.2/65 (08/31 0518) BUN/Cr/glu/ALT/AST/amyl/lip:  23/0.62/--/--/--/--/-- (08/31 0533)   Assessment: 72 y.o. s/p Procedure(s): Interval debulking surgery with Dr. Skeet Latch on 01/27/18 consisting of an exploratory laparotomy infra-gastric omentectomy bilateral salpingo-oophorectomy radical optimal tumor debulking, resection of anterior abdominal wall.  Reoperation of EXPLORATORY LAPAROTOMY, ABDOMINAL WASHOUT on 01/31/18: critically ill  Heme: Post-operative anemia. Hgb 10.9 which is stable, has not required transfusion since 02/02/18.  Remains thrombocytopenic but stable.   ID: Fever this morning to 103. WBC 14 this am. Granix received 02/01/18 per Dr. Alvy Bimler.  Vancomycin stopped on 02/04/18. Anidulafungin initiated 02/01/18. Meropenum IV started 01/31/18. Presumed sepsis from gastrointestinal source but unable to ID the perforation after exp lap on 01/31/18. Blood cultures from 01/31/18 with no growth. Tracheal aspirate from 02/01/18 resulting few gram positive rods.  Consider restarting Vanc. Consider CT abd/pelvis if continues to have fevers to look for undrained intraabdominal collections. Will send fluid from JP3 (the pelvic JP) for culture.    CV: Tachycardia and hypertension improved from 02/03/18. Troponin 0.04 at 16:00 on 02/01/18. Addition of beta blocker per CC. Monitoring to continue.  Resp: ARDS in setting of aspiration pneumonia.  Chest 1 view this am:  Stable appearance of the chest with multifocal pneumonia.  No new focal abnormality is noted. Reviewed imaging with PB, NP. PEEP to 8 today per CC. Continue to make ventilator gains though not yet ready for spontaneous breathing.  GI:  Tolerating po: No: intubated. Protonix ordered. GI consultation appreciated. CT AP on 01/31/18 with findings above.  No obvious perforation seen on re-operation on 01/31/18. NG with brown output (150cc in canister). TPN initiated 02/02/18. Will obtain CT abd/pelvis at approx 1 week postop and if no evidence of ongoing perforation, would consider changing to enteric feeds and stopping TPN.   GU: AKI post-op resolved. Creatinine 0.6 this am. Foley in place.  Plan for continued diuresis today per CC.   FEN: Cmet this am. Ca+ 7.4. Replacement today per CC. Continue to monitor. Hypokalemia acidosis resolved per CC note.  Prophylaxis: SCDs in place. Anticoagulation on hold due to persistent thrombocytopenia from sepsis. No active bleeding and H&H stable.  Plan: Continue care per Critical Care. Newly febrile after 24 hour period of no fevers. S/p stopping vanc. Likely sources include pneumonia and intraperitoneal source. Will send JP fluid for culture. Reconsider broadening antibiotic coverage again vs rescan abd/pelvis to look for undrained fluid collections that appear infected.  Plan to hold on removal of JP drains at this time per given new fevers (would consider pulling them after repeat CT abd/pelvis shows no active leak and have started enteric feeds with no leak).  Maintain NPO status. Continue TPN per pharmacy. Strict intake and output  Thereasa Solo 02/05/2018, 10:47 AM

## 2018-02-05 NOTE — Progress Notes (Signed)
[facility] Progress Note Patient Name: Jo Parker DOB: 1946-03-11 MRN: [id number]   Date of Service  02/05/2018  HPI/Events of Note  Hypokalemia and borderline BP  eICU Interventions  Iv KCL. Remove clonidine patch. Also renewed expiring precedex order.        Sharia Reeve 02/05/2018, 8:06 PM

## 2018-02-05 NOTE — Progress Notes (Signed)
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  She is completed 3 cycles of chemotherapy and is now status post bilateral salpingo-oophorectomy and omentectomy with debulking on 8/22. 8/23: Difficult with analgesic control.  This was complicated by patient becoming somnolent and hypotensive after adjustment of narcotics, treated with Saline and adjustment of medications. 8/24: Still somnolent, noted to be confused.  Is fused 1 unit for hemoglobin of 6.7.  Internal medicine consulted for acute encephalopathy also postoperative acute kidney injury.  8/25: Remains encephalopathic.  Hemoglobin improved posttransfusion.  Progress note commenting on dyspnea. Had episode of vomiting shortly after arriving in [facility] unit.  More hypertensive and tachycardic. 8/26-8/30:8/26 transferred to ICU w/ concern for perforation w/ free air noted.  Imipenem started.  Went for exploratory laparotomy with washout.  No significant contamination identified, presumed spontaneously closed microperf. .  Returned with abdominal drains postoperative chest x-ray showing evidence of aspiration pneumonia; mixed respiratory and metabolic acidosis & in septic shock. 8/27: weaning pressors. Hgb dropped from 8.8 to 7.4; got 2 units of blood,  febrile, added. vancomycin.  Neupogen given for neutropenia. 8/28: Pressors off/shock resolved. NAG acidosis and Lactic acidosis resolved.  FiO2/PEEP down, attempted PSV..  WBC count up.  Started TPN. 8/29: FiO2 and PEEP requirements increased, had wean these down to 40/5 now 60/10.  Chest x-ray with worsening right-sided airspace disease likely reflecting de-recruitment. Delirium remains significant obstacle.  Beta-blocker, and clonidine patch added  for hypertension 8/30: Diuresed 4.5 L the day prior with Lasix.  Little more awake.  X-ray improved.  Vancomycin discontinued.  Continuing diuresis, RASS goal changed to -1, dropping PEEP to 8.  Resolved hospital Problems  Septic shock 8/28 AKI  Assessment & Plan:   Sepsis,  fever  with neutropenia: peritonitis (microperforation), complicated by aspiration pneumonia.  Fever this morning 8/31, WBC continues to improve Plan Day #5 Eraxis, day #6 meropenem Vancomycin stopped 8/30 after 4 days.  With fever, consider repeat cultures, low threshold to restart vancomycin if she decompensates in any way  Status post exploratory laparotomy for free air on 8/26 ; following bilateral salpingo-oophorectomy, omentectomy, and debulking surgery on 8/22 Plan N.p.o. TPN for nutrition at this time Routine wound and drain care  Acute hypoxic respiratory failure in the setting of aspiration pneumonia/ARDS Portable chest x-ray reviewed for today 8/30: Improved aeration with right greater than left airspace disease.  Comparing to day prior aeration looks better Plan Tolerated PEEP decreased 8, continue 8 for now given persistent infiltrates Hypertonic saline for secretion clearance Antibiotics as above Continue diuresis as blood pressure and renal function will tolerate Follow chest x-ray for improvement.  She is approaching the point where we may be able to start doing spontaneous breathing trials  Sinus tachycardia, hypertension, and grade 1 diastolic dysfunction with minimal troponin I bump.  Echo showed hyperdynamic ejection fraction with no wall motion abnormality Moderate control Plan Continue current clonidine, beta-blocker Hydralazine added as needed Continue current diuresis and follow blood pressure, renal function  Acute metabolic encephalopathy.  Suspect this is multifactorial and secondary to sepsis as well as residual narcotics and metabolic acidosis, this remains a significant  barrier. Plan PAD protocol RASS goal -1 Currently on Precedex, fentanyl low-dose infusion; wean as able  At risk for  recurrent AKI.  Stable Plan Following BMP closely with IV diuresis Strict I/O  At risk for fluid and electrolyte imbalance: Anasarca, hypomagnesemia, hypokalemia, mild hypernatremia  Plan D5W at [facility] Follow BMP  Postoperative anemia.  Does have coffee ground gastric tube output, this seems to have improved Hemoglobin stable Plan Follow CBC  Leukocytosis.  Initially was neutropenic however did receive Granix on 8/28 per oncology Plan Follow CBC  Thrombocytopenia Plan SCDs Follow CBC Consider add back enoxaparin soon  Protein calorie malnutrition in the setting of cancer Plan TPN  Hyperglycemia Plan SSI per protocol  Stage IV ovarian cancer, now status post bilateral salpingo-oophorectomy omentectomy and debulking surgery on 8/22 Plan Per GYN/Onc   Best practice/Goals of care/disposition.   DVT prophylaxis: LMWH-->SCD (8/26); continuing SCDs as of 8/29 given thrombocytopenia GI prophylaxis: NA-->H2B, changed to PPI on 8/27 Diet: NPO, starting TPN on 8/28 Mobility:BR Code Status: Full Code Family Communication: Husband and son updated today 8/31  Disposition / Summary of Today's Plan 02/05/18   Working diagnosis is severe sepsis secondary to aspiration pneumonia, and microperforation with peritonitis.  Her primary obstacles at this point remain delirium, PNA w/ resolving ARDS, and volume overload.  For today the goal will be to continue aggressive diuresis, start peeling back antimicrobials, changing RASS goal to -1.  Tolerated decrease in PEEP to 8, do not believe we are ready for spontaneous breathing quite yet  Consultants:  Internal medicine 8/25 Critical care 8/26 General surgery 8/26 GI medicine 8/27: They did not feel she had overt GI bleed.  Recommended prophylaxis with PPI.  Signed off. Procedures: bilateral salpingo-nephrectomy  omentectomy and debulking surgery on 8/22 8/26: Intubation 8/26: Exploratory laparotomy, washout, placement of right upper quadrant x2 JP drain and left lower quadrant JP drain  Significant Diagnostic Tests: CT abdomen 8/26: Hemic bowel with portal venous gas and bowel pneumatosis with large amount of pneumoperitoneum.  Unclear as to site of perforation.  Multiple pockets of air adjacent to the sigmoid colon.  Enhancement of the peritoneum concerning for peritonitis.  There was aortic arterial sclerosis. Echocardiogram 8/27: Particular ejection fraction 65 to 70%.  She has moderate LVH, and hyperdynamic LV function.  There is grade 1 diastolic dysfunction.  Trivial MR.  Normal left atrial size.  The IV C was not visualized  Micro Data: Blood Culture times two 8/26>>> Sputum 8/27: Abundant yeast, few Candida albicans Antimicrobials:  Meropenem 8/26>>> Vancomycin 8/27>>> 8/30 Eraxis 8/27>>>   Subjective/interval events  Sedated Husband and son at bedside Febrile this morning, WBC has improved Tolerating diuretics hemodynamically, from renal standpoint  Objective    Examination: General: Elderly ill-appearing woman on mechanical ventilation she will move her head and upper extremities with stimulation, will not follow commands  HEENT mild scleral edema, ET tube in good position Pulmonary: Coarse bilateral breath sounds more so on the right than on the left, no wheezing Cardiac: Regular, borderline tachycardia, no murmur Abdomen: Dressing clean and dry, JP drains in place with serous fluid, hypoactive bowel sounds GU: Foley catheter in place Neuro: Moves her neck, upper extremities with stimulation, did not follow commands Extremities: Anasarca  Blood pressure (!) 171/61, pulse (!) 106, temperature (!) 103 F (39.4 C), temperature source Rectal, resp. rate (!) 25, height 5\' 3"  (1.6 m), weight 66.6 kg, SpO2 99 %.    Vent Mode: PRVC FiO2 (%):  [40 %-50 %] 40 % Set Rate:  [20 bmp] 20  bmp Vt Set:  [330 mL] 330 mL PEEP:  [8 cmH20] 8  cmH20 Plateau Pressure:  [13 cmH20-15 cmH20] 15 cmH20   Intake/Output Summary (Last 24 hours) at 02/05/2018 0932 Last data filed at 02/05/2018 0800 Gross per 24 hour  Intake 2472.22 ml  Output 8478 ml  Net -6005.78 ml   Filed Weights   02/03/18 0500 02/04/18 0500 02/05/18 0530  Weight: 70.1 kg 66.2 kg 66.6 kg     Labs   CBC: Recent Labs  Lab 02/02/18 0455 02/03/18 0410 02/04/18 0407 02/04/18 0623 02/05/18 0518  WBC 27.9* 36.7* SPECIMEN CONTAMINATED, UNABLE TO PERFORM TEST(S). 23.3* 14.3*  NEUTROABS 26.2 34.8* SPECIMEN CONTAMINATED, UNABLE TO PERFORM TEST(S). 20.7* 11.6*  HGB 11.9* 12.2 SPECIMEN CONTAMINATED, UNABLE TO PERFORM TEST(S). 11.1* 10.9*  HCT 35.0* 35.3* SPECIMEN CONTAMINATED, UNABLE TO PERFORM TEST(S). 33.9* 34.2*  MCV 76.8* 76.2* SPECIMEN CONTAMINATED, UNABLE TO PERFORM TEST(S). 79.0 82.0  PLT 62* 57* SPECIMEN CONTAMINATED, UNABLE TO PERFORM TEST(S). 52* 65*   Basic Metabolic Panel: Recent Labs  Lab 01/29/18 2113  02/03/18 0410  02/03/18 1733 02/04/18 0407 02/04/18 0612 02/04/18 1800 02/05/18 0518 02/05/18 0533  NA  --    < > 147*   < > 148* SPECIMEN CONTAMINATED, UNABLE TO PERFORM TEST(S). 146* 147*  --  146*  K  --    < > 3.6   < > 3.4* SPECIMEN CONTAMINATED, UNABLE TO PERFORM TEST(S). 2.9* 3.4*  --  4.3  CL  --    < > 109   < > 105 SPECIMEN CONTAMINATED, UNABLE TO PERFORM TEST(S). 101 101  --  93*  CO2  --    < > 30   < > 34* SPECIMEN CONTAMINATED, UNABLE TO PERFORM TEST(S). 33* 38*  --  40*  GLUCOSE  --    < > 208*   < > 194* SPECIMEN CONTAMINATED, UNABLE TO PERFORM TEST(S). 203* 134*  --  166*  BUN  --    < > 23   < > 23 SPECIMEN CONTAMINATED, UNABLE TO PERFORM TEST(S). 21 19  --  23  CREATININE  --    < > 0.70   < > 0.66 SPECIMEN CONTAMINATED, UNABLE TO PERFORM TEST(S). 0.61 0.43*  --  0.62  CALCIUM  --    < > 7.6*   < > 7.6* SPECIMEN CONTAMINATED, UNABLE TO PERFORM TEST(S). 7.5* 7.5*  --  7.4*  MG 2.0   --  1.3*  --   --  SPECIMEN CONTAMINATED, UNABLE TO PERFORM TEST(S). 1.2*  --  1.6*  --   PHOS 2.9  --  2.9  --   --  SPECIMEN CONTAMINATED, UNABLE TO PERFORM TEST(S). 4.2  --  3.9  --    < > = values in this interval not displayed.   GFR: Estimated Creatinine Clearance: 58.3 mL/min (by C-G formula based on SCr of 0.62 mg/dL). Recent Labs  Lab 01/29/18 2130  01/31/18 0800  02/01/18 0130 02/01/18 2027  02/03/18 0410 02/04/18 0407 02/04/18 0623 02/05/18 0518  WBC  --    < >  --    < > 1.6*  --    < > 36.7* SPECIMEN CONTAMINATED, UNABLE TO PERFORM TEST(S). 23.3* 14.3*  LATICACIDVEN 1.4  --  2.7*  --  3.3* 2.4*  --   --   --   --   --    < > = values in this interval not displayed.   Liver Function Tests: Recent Labs  Lab 01/29/18 1701 02/02/18 0455 02/03/18 0410  AST 32 32 31  ALT 20 18 19  ALKPHOS 64 102 143*  BILITOT 1.2 0.9 1.1  PROT 5.7* 4.2* 4.6*  ALBUMIN 3.0* 1.8* 1.7*   No results for input(s): LIPASE, AMYLASE in the last 168 hours. Recent Labs  Lab 01/29/18 2130  AMMONIA 18   ABG    Component Value Date/Time   PHART 7.454 (H) 02/03/2018 0345   PCO2ART 44.9 02/03/2018 0345   PO2ART 52.6 (L) 02/03/2018 0345   HCO3 31.0 (H) 02/03/2018 0345   TCO2 17 (L) 01/31/2018 1728   ACIDBASEDEF 0.4 02/01/2018 0432   O2SAT 86.9 02/03/2018 0345    Coagulation Profile: Recent Labs  Lab 01/31/18 0850  INR 1.36   Cardiac Enzymes: Recent Labs  Lab 01/29/18 2113  01/31/18 0850 01/31/18 2021 02/01/18 0130 02/01/18 0850 02/01/18 1605  CKTOTAL 197  --   --   --   --   --   --   TROPONINI <0.03   < > <0.03 0.03* <0.03 0.04* 0.04*   < > = values in this interval not displayed.   HbA1C: No results found for: HGBA1C CBG: Recent Labs  Lab 02/04/18 1526 02/04/18 2002 02/05/18 0005 02/05/18 0303 02/05/18 0742  GLUCAP 154* 137* 131* 133* 145*    Independent CC time 35 minutes  Baltazar Apo, MD, PhD 02/05/2018, 10:11 AM [facility] [phone] or if no answer [phone]

## 2018-02-05 NOTE — Progress Notes (Signed)
[facility] NOTE   Pharmacy Consult for TPN Indication: bowel perforation  Patient Measurements: Height: '5\' 3"'$  (160 cm) Weight: 146 lb 13.2 oz (66.6 kg) IBW/kg (Calculated) : 52.4 TPN AdjBW (KG): 58.3 Body mass index is 26.01 kg/m.  Insulin Requirements: 20 units resistant SSI (no meds PTA) - Scheduled Novolog 4 units q4h (started 8/30) = 24 units/ 24 hrs  Current Nutrition: NPO   IVF: D5W @ 10 ml/hr - Also on continuous Precedex at 21.5 ml/hr, Fentanyl at 5 ml/hr - Furosemide 60 mg IV q8h (aggressive diuresis)  Central access: Implanted port 10/14/17, triple lumen PICC 02/01/18 TPN start date: 02/02/2018  ASSESSMENT                                                                                                          HPI: Pt is a 72 year old with ovarian cancer status post bilateral salpingo-oophorectomy and omentectomy with debulking on 8/22. Ex lap on 8/26. Pt remains in ICU critically ill on pressors, vent support.  She has had poor appetite and poor PO intakes since admission and for unknown amount of time PTA and will be at risk for refeeding syndrome.  Significant events:  - 8/27 Start TPN. Hold lipids for first  7 days while pt in ICU  - 8/30 scheduled Novolog insulin added q4h  Today:   Glucose - CBG range 112-145; controlled with scheduled Novolog and SSI. No history of DM.  Electrolytes - Na 146 (hypoNa TPN formula may help with elevated Na, ongoing lasix diuresis d/t volume overload), and Mag 1.2 (low but improved after aggressive replacement, 6g yesterday, ongoing Lasix q8h).    Others WNL including K 4.3 ( improved after aggressive replacement, 120 mEq yesterday, ongoing Lasix q8h) Corr Ca 9.24, Phos.  Renal - Scr 0.62  I/O: Aggressive diuresis with Lasix Q8h and aiming for negative volume status. I/O - 4 L on 8/29, -4.6L on 8/30.  (Admission net I/O +3 L)  LFTs - Alk phos up to 143.  AST/ALT and Tbili WNL.  TGs - 117 (  8/26) Propofol d/c on 8/28.  Prealbumin - 6/2 ( 8/25)   NUTRITIONAL GOALS                                                                                             RD recs (8/27): Kcal:  1478, Protein:  87-99 grams (1.5-1.7 grams/kg), Fluid:  >/= 1.5 L/day  Based on published guidelines, while the patient meets ICU status the initiation of lipids is being delayed for 7 days or until transition out of ICU. Planned start date for lipids is 9/4.  Goal will be to meet 100% of  the patient's protein needs and approximately 80% of their caloric needs.  Clinimix 5/20 @ 80 mL/hr to provide 96 grams protein (100% of goal) and 1689 kcal (100% of goal)  Glucose infusion rate will be 3.8 mg/kg/min (Maximum 5 mg/kg/min)   PLAN  Now: KCl 10 meq / 67m IV (central line) x 4 runs per MD.  Replacing to prevent expected hypokalemia with ongoing diuresis.  Limit to 4 runs to prevent excess volume. Mag 4g IV x1 dose                                                                                                                          At 1800 today:  Continue Clinimix E 5/20 at 60 ml/hr.   Monitor I/O status, discussed titration of TPN with Dr BLamonte Sakai  Do not titrate up to goal rate today since goal is diurese of excess volume.    TPN to contain standard multivitamins and trace elements.  Hold 20% fat emulsion while pt is ICU status x 7 days.  Start on 9/4.  Continue resistant  SSI q4h. Continue scheduled Novolog 4 units q4h per MD.  TPN lab panels on Mondays & Thursdays.  Daily BMET, Mag, Phos x3 days    F/u daily.   CGretta ArabPharmD, BCPS Pager [phone]/31/2019 8:04 AM

## 2018-02-06 ENCOUNTER — Inpatient Hospital Stay (HOSPITAL_COMMUNITY): Payer: Medicare PPO

## 2018-02-06 LAB — GLUCOSE, CAPILLARY
GLUCOSE-CAPILLARY: 175 mg/dL — AB (ref 70–99)
GLUCOSE-CAPILLARY: 113 mg/dL — AB (ref 70–99)
GLUCOSE-CAPILLARY: 160 mg/dL — AB (ref 70–99)
GLUCOSE-CAPILLARY: 221 mg/dL — AB (ref 70–99)
GLUCOSE-CAPILLARY: 139 mg/dL — AB (ref 70–99)

## 2018-02-06 LAB — CBC WITH DIFFERENTIAL/PLATELET
BASOS PCT: 0 %
Basophils Absolute: 0 10*3/uL (ref 0.0–0.1)
Eosinophils Absolute: 0.2 10*3/uL (ref 0.0–0.7)
Eosinophils Relative: 1 %
HEMATOCRIT: 31.4 % — AB (ref 36.0–46.0)
HEMOGLOBIN: 9.8 g/dL — AB (ref 12.0–15.0)
LYMPHS ABS: 0.9 10*3/uL (ref 0.7–4.0)
LYMPHS PCT: 5 %
MCH: 25.8 pg — AB (ref 26.0–34.0)
MCHC: 31.2 g/dL (ref 30.0–36.0)
MCV: 82.6 fL (ref 78.0–100.0)
MONOS PCT: 5 %
Monocytes Absolute: 0.9 10*3/uL (ref 0.1–1.0)
NEUTROS ABS: 15 10*3/uL — AB (ref 1.7–7.7)
Neutrophils Relative %: 89 %
Platelets: 81 10*3/uL — ABNORMAL LOW (ref 150–400)
RBC: 3.8 MIL/uL — ABNORMAL LOW (ref 3.87–5.11)
RDW: 23.8 % — ABNORMAL HIGH (ref 11.5–15.5)
WBC: 17 10*3/uL — ABNORMAL HIGH (ref 4.0–10.5)

## 2018-02-06 LAB — BASIC METABOLIC PANEL
ANION GAP: 9 (ref 5–15)
BUN: 24 mg/dL — AB (ref 8–23)
CO2: 35 mmol/L — AB (ref 22–32)
Calcium: 6.4 mg/dL — CL (ref 8.9–10.3)
Chloride: 102 mmol/L (ref 98–111)
Creatinine, Ser: 0.45 mg/dL (ref 0.44–1.00)
GFR calc Af Amer: >60 mL/min (ref >60)
GFR calc non Af Amer: >60 mL/min (ref >60)
GLUCOSE: 247 mg/dL — AB (ref 70–99)
POTASSIUM: 3.2 mmol/L — AB (ref 3.5–5.1)
Sodium: 146 mmol/L — ABNORMAL HIGH (ref 135–145)

## 2018-02-06 LAB — PHOSPHORUS: Phosphorus: 2.2 mg/dL — ABNORMAL LOW (ref 2.5–4.6)

## 2018-02-06 LAB — MAGNESIUM: Magnesium: 1.5 mg/dL — ABNORMAL LOW (ref 1.7–2.4)

## 2018-02-06 LAB — LACTIC ACID, PLASMA: LACTIC ACID, VENOUS: 1.3 mmol/L (ref 0.5–1.9)

## 2018-02-06 MED ORDER — VANCOMYCIN HCL IN DEXTROSE 1-5 GM/200ML-% IV SOLN
1000.0000 mg | INTRAVENOUS | Status: AC
  Administered 2018-02-06: 1000 mg via INTRAVENOUS
  Filled 2018-02-06: qty 200

## 2018-02-06 MED ORDER — POTASSIUM PHOSPHATES 15 MMOLE/5ML IV SOLN
10.0000 mmol | Freq: Once | INTRAVENOUS | Status: AC
  Administered 2018-02-06: 10 mmol via INTRAVENOUS
  Filled 2018-02-06: qty 3.33

## 2018-02-06 MED ORDER — POTASSIUM CHLORIDE 10 MEQ/50ML IV SOLN
10.0000 meq | INTRAVENOUS | Status: AC
  Administered 2018-02-06 (×6): 10 meq via INTRAVENOUS
  Filled 2018-02-06 (×7): qty 50

## 2018-02-06 MED ORDER — TRACE MINERALS CR-CU-MN-SE-ZN 10-1000-500-60 MCG/ML IV SOLN
INTRAVENOUS | Status: AC
  Administered 2018-02-06: 17:00:00 via INTRAVENOUS
  Filled 2018-02-06 (×2): qty 1440

## 2018-02-06 MED ORDER — MAGNESIUM SULFATE 4 GM/100ML IV SOLN
4.0000 g | INTRAVENOUS | Status: AC
  Administered 2018-02-06: 4 g via INTRAVENOUS
  Filled 2018-02-06: qty 100

## 2018-02-06 MED ORDER — VANCOMYCIN HCL IN DEXTROSE 1-5 GM/200ML-% IV SOLN
1000.0000 mg | INTRAVENOUS | Status: DC
  Administered 2018-02-07 – 2018-02-16 (×10): 1000 mg via INTRAVENOUS
  Filled 2018-02-06 (×10): qty 200

## 2018-02-06 MED ORDER — MIDAZOLAM HCL 2 MG/2ML IJ SOLN
INTRAMUSCULAR | Status: AC
  Filled 2018-02-06: qty 2

## 2018-02-06 MED ORDER — ACETAMINOPHEN 160 MG/5ML PO SOLN
650.0000 mg | ORAL | Status: DC | PRN
  Administered 2018-02-06 – 2018-02-21 (×23): 650 mg
  Filled 2018-02-06 (×24): qty 20.3

## 2018-02-06 MED ORDER — MIDAZOLAM HCL 2 MG/2ML IJ SOLN
2.0000 mg | INTRAMUSCULAR | Status: DC | PRN
  Administered 2018-02-06: 2 mg via INTRAVENOUS

## 2018-02-06 MED ORDER — DEXMEDETOMIDINE HCL IN NACL 400 MCG/100ML IV SOLN
0.0000 ug/kg/h | INTRAVENOUS | Status: AC
  Administered 2018-02-06: 1.6 ug/kg/h via INTRAVENOUS
  Administered 2018-02-06: 1.2 ug/kg/h via INTRAVENOUS
  Administered 2018-02-07 (×4): 1.7 ug/kg/h via INTRAVENOUS
  Administered 2018-02-07: 1.6 ug/kg/h via INTRAVENOUS
  Administered 2018-02-07: 1.7 ug/kg/h via INTRAVENOUS
  Administered 2018-02-08: 1.3 ug/kg/h via INTRAVENOUS
  Administered 2018-02-08 (×2): 1.7 ug/kg/h via INTRAVENOUS
  Administered 2018-02-08: 1.3 ug/kg/h via INTRAVENOUS
  Administered 2018-02-08: 1.7 ug/kg/h via INTRAVENOUS
  Administered 2018-02-08: 1.3 ug/kg/h via INTRAVENOUS
  Filled 2018-02-06 (×15): qty 100

## 2018-02-06 NOTE — Progress Notes (Signed)
Gynecologic Oncology Progress Note  72 year old female diagnosed with Stage IV ovarian cancer who received four cycles of neoadjuvant chemotherapy with carboplatin and taxol with fourth cycle completed on 12/20/17 .  She underwent interval debulking surgery with Dr. Skeet Latch on 01/27/18 consisting of an exploratory laparotomy infra-gastric omentectomy bilateral salpingo-oophorectomy radical optimal tumor debulking, resection of anterior abdominal wall.  Her post-operative course was complicated by altered mental status noted on POD1 with improvement after Narcan administration but with return of symptoms, acute renal injury, tachycardia, dyspnea (CT chest performed 01/30/18-neg for PE), anemia.  Her AKI and altered mental status appeared to improve on 01/30/18 in the am but symptoms worsened the evening of 01/30/18 with episodes of emesis, tachycardia, hypertension, and confusion. A CT scan on 01/31/18 revealed findings most consistent with ischemic bowel with portal venous gas and bowel pneumatosis and large amount of pneumoperitoneum with concerns for peritonitis.  She was taken back to the OR on 01/31/18 with operative findings of free air and large volume ascites/fluid, but unable to visualize site of bowel perforation, no evidence of ischemia. It was felt that aspiration most likely occurred during intubation.        Subjective: Patient intubated. Husband and sons at the bedside.     Objective: Vital signs in last 24 hours: Temp:  [98.2 F (36.8 C)-102.6 F (39.2 C)] 98.8 F (37.1 C) (09/01 0600) Pulse Rate:  [70-119] 88 (09/01 0600) Resp:  [18-32] 25 (09/01 0814) BP: (57-235)/(24-146) 205/146 (09/01 0830) SpO2:  [98 %-100 %] 98 % (09/01 0814) FiO2 (%):  [40 %] 40 % (09/01 0814) Weight:  [143 lb 15.4 oz (65.3 kg)] 143 lb 15.4 oz (65.3 kg) (09/01 0541) Last BM Date: 02/02/18  Intake/Output from previous day: 08/31 0701 - 09/01 0700 In: 5344.5 [I.V.:2355.5; NG/GT:50; IV Piggyback:2939] Out:  1761 [Urine:3455; Drains:99]  Physical Examination: General: apparent increased work of breathing.  Resp: coarse breath sounds bilaterally, rhonchi noted Cardio: heart rate at 130, regular rhythm GI: incision: midline incision with gauze dressings, clean, no active drainage noted and abdomen slightly distended, hypoactive bowel sounds Extremities: generalized edema in all extrem JP drain x3 in the abdomen charged. Minimal serous drainage noted in all three. Foley with clear, blood tinged urine   Labs:   BUN/Cr/glu/ALT/AST/amyl/lip:  26/0.61/--/--/--/--/-- (08/31 1615)   Assessment: 72 y.o. s/p Procedure(s): Interval debulking surgery with Dr. Skeet Latch on 01/27/18 consisting of an exploratory laparotomy infra-gastric omentectomy bilateral salpingo-oophorectomy radical optimal tumor debulking, resection of anterior abdominal wall.  Reoperation of EXPLORATORY LAPAROTOMY, ABDOMINAL WASHOUT on 01/31/18: critically ill  Heme: Post-operative anemia. Hgb 10.9 which is stable, has not required transfusion since 02/02/18.  Remains thrombocytopenic but stable. Labs from today pending. Likely hematuria is from thrombocytopenia. If significant bleeding - would consider platelet transfusion.   ID: Fever this morning to 103. Presumed sepsis from gastrointestinal source but unable to ID the perforation after exp lap on 01/31/18. Contribution now from pneumonia.  WBC pending this am.  Granix received 02/01/18 per Dr. Alvy Bimler.   Vancomycin stopped on 02/04/18 and then fevers began again. Dr Lamonte Sakai planning to restart Vanc today.  Anidulafungin initiated 02/01/18.  Meropenum IV started 01/31/18. Blood cultures from 01/31/18 with no growth. Tracheal aspirate from 02/01/18 resulting few gram positive rods. JP drain culture from 02/05/18 no growth to date. Consider CT abd/pelvis tomorrow if patient more "comfortable" to look for undrained intraabdominal collections.  CV: Tachycardia and hypertension improved persistent  (tachycardia had improved but increased this morning). Brittle with changes  in sedation.  Critical care team are aggressively managing BP through hydralazine and sedation alterations. Likely some of the underlying hypotension on sedation is secondary to aggressive diuresis in setting of ARDS and pulm edema.  Resp: ARDS in setting of aspiration pneumonia and sepsis.   Chest 1 view this am: Worsening multifocal pneumonia.   She had continued to make ventilator gains though not yet ready for spontaneous breathing as she has apparent increased WOB and persistent pneumonia on CXR.  GI:  Tolerating po: No: intubated. Protonix ordered. GI consultation appreciated. CT AP on 01/31/18 with findings above.  No obvious perforation seen on re-operation on 01/31/18. NG with brown output (150cc in canister). TPN initiated 02/02/18. Will obtain CT abd/pelvis at approx 1 week postop and if no evidence of ongoing perforation, would consider changing to enteric feeds and stopping TPN.   GU: AKI post-op resolved. Creatinine 0.6 02/05/18. Foley in place.  Plan for continued diuresis today per CC (as tolerated by BP). Hematuria (trace) - likely from foreign body irritation (foley) in setting of thrombocytopenia.  FEN: Cmet pending this am. Ca+ 7.4. Replacement today per CC.   Prophylaxis: SCDs in place. Anticoagulation on hold due to persistent thrombocytopenia from sepsis. No active bleeding and H&H stable.  Plan: Continue care per Critical Care. Persistently febrile. Agree with plan to restart vanc. Likely sources for fevers include pneumonia and intraperitoneal source. Will rescan abd/pelvis to look for undrained fluid collections that appear infected when patient's BP and resp status improved.  Plan to hold on removal of JP drains at this time per given new fevers (would consider pulling them after repeat CT abd/pelvis shows no active leak and have started enteric feeds with no clinical leak).  Maintain NPO status.  Continue TPN per pharmacy.  I spent 20 minutes speaking with family members including her husband and 3 sons in person, and a family member Tiffany by phone. I spoke with Dr Lamonte Sakai in the room regarding her status and plan.   Thereasa Solo 02/06/2018, 9:12 AM

## 2018-02-06 NOTE — Progress Notes (Signed)
Pharmacy Antibiotic Note  Jo Parker is a 72 y.o. female admitted on 01/27/2018 with hx of ovarian cancer for bilateral salpingo-oophorectomy and omentectomy with debulking on 8/22.  She now has developed suspected intra-abdominal infection.  CT abdomen results worrisome for ischemic bowel with portal venous gas, bowel pneumatosis, and large amount of pneumoperitoneum without clear source of perforation.  Pharmacy has been consulted for meropenem dosing on 8/26 and added vancomycin on 8/27 for pneumonia.  Anidulafungin added for empiric coverage given concern for intra-abdominal infection, ongoing fever.  Today: D6 Meropenem, D5 Anidulafungin Tm 103, Tc 101.5 SCr:  Decreased to 0.45 WBC: 17  (Granix x1 on 8/27.  Quick increase in WBC, further doses d/c.)  Plan:  Continue Meropenem 1g IV q8h  Continue Anidulafungin 200mg  IV x1 followed by 100mg  IV q24h Resume Vancomycin 1g IV q24h Check vancomycin levels if remains on vancomycin > 3-4 days.  Goal AUC 400-500. Follow up renal fxn, culture results, and clinical course.  F/u ability to de-escalate antibiotics.   Height: 5\' 3"  (160 cm) Weight: 143 lb 15.4 oz (65.3 kg) IBW/kg (Calculated) : 52.4  Temp (24hrs), Avg:100.6 F (38.1 C), Min:98.2 F (36.8 C), Max:102.6 F (39.2 C)  Recent Labs  Lab 01/31/18 0800  02/01/18 0130  02/01/18 2027 02/02/18 0455  02/03/18 0410  02/04/18 0407 02/04/18 0612 02/04/18 0623 02/04/18 1800 02/05/18 0518 02/05/18 0533 02/05/18 1615  WBC  --    < > 1.6*  --   --  27.9*  --  36.7*  --  SPECIMEN CONTAMINATED, UNABLE TO PERFORM TEST(S).  --  23.3*  --  14.3*  --   --   CREATININE  --    < >  --    < >  --  0.96   < > 0.70   < > SPECIMEN CONTAMINATED, UNABLE TO PERFORM TEST(S). 0.61  --  0.43*  --  0.62 0.61  LATICACIDVEN 2.7*  --  3.3*  --  2.4*  --   --   --   --   --   --   --   --   --   --   --    < > = values in this interval not displayed.    Estimated Creatinine Clearance: 57.8 mL/min  (by C-G formula based on SCr of 0.61 mg/dL).    Allergies  Allergen Reactions  . Oxycodone     confusion    Antimicrobials this admission: 8/26 Meropenem>> 8/27 Vancomycin >> 8/30, restarted 9/1 >>  8/27 Anidulafungin >>   Dose adjustments this admission:   Microbiology results: 8/24 UCx: NGF 8/25 MRSA PCR: negative 8/26 BCx: NGF 8/27 Trach asp: few candida albicans 9/1 Bcx: 9/1 UCx: 9/1 Tracheal aspirate:    Thank you for allowing pharmacy to be a part of this patient's care.  Gretta Arab PharmD, BCPS Pager [phone] 02/06/2018 10:12 AM

## 2018-02-06 NOTE — Progress Notes (Signed)
[place] NOTE   Pharmacy Consult for TPN Indication: bowel perforation  Patient Measurements: Height: '5\' 3"'$  (160 cm) Weight: 143 lb 15.4 oz (65.3 kg) IBW/kg (Calculated) : 52.4 TPN AdjBW (KG): 58.3 Body mass index is 25.5 kg/m.  Insulin Requirements: 19 units resistant SSI (no meds PTA) - Scheduled Novolog 4 units q4h (started 8/30, per MD) = 24 units/ 24 hrs  Current Nutrition: NPO   IVF: D5W @ 10 ml/hr - Also on continuous Precedex at 21.5 ml/hr, Fentanyl at 5 ml/hr - Furosemide 60 mg IV q8h (aggressive diuresis)  Central access: Implanted port 10/14/17, triple lumen PICC 02/01/18 TPN start date: 02/02/2018  ASSESSMENT                                                                                                          HPI: Pt is a 72 year old with ovarian cancer status post bilateral salpingo-oophorectomy and omentectomy with debulking on 8/22. Ex lap on 8/26. Pt remains in ICU critically ill on pressors, vent support.  She has had poor appetite and poor PO intakes since admission and for unknown amount of time PTA and will be at risk for refeeding syndrome.  Significant events:  - 8/27 Start TPN. Hold lipids for first  7 days while pt in ICU  - 8/30 scheduled Novolog insulin added q4h  Today:   Glucose - CBG range 142-175; controlled with scheduled Novolog and SSI. No history of DM.  Electrolytes - Na 146 (hypoNa TPN formula may help with elevated Na, ongoing lasix diuresis d/t volume overload), K 3.2 (low and decreased despite replacement yesterday with 80 mEq and lasix held x2 doses), Phos 2.2, Mag 1.5 (despite 4g replacement), and CorrCa 8.24     Lasix doses held 8/31 at 220 (BP 90/41) and 9/1 at 0600 (BP 99/40) d/t hypotension.  Renal - Scr 0.45  I/O: Aggressive diuresis with Lasix Q8h and aiming for negative volume status. I/O - 4 L on 8/29, -4.6L on 8/30, +1.8 L on 8/31 (2 doses of Lasix held).  Admission net I/O +4.8  L  LFTs - Alk phos up to 143.  AST/ALT and Tbili WNL.  TGs - 117 ( 8/26) Propofol d/c on 8/28.  Prealbumin - 6/2 ( 8/25)   NUTRITIONAL GOALS                                                                                             RD recs (8/27): Kcal:  8119, Protein:  87-99 grams (1.5-1.7 grams/kg), Fluid:  >/= 1.5 L/day  Based on published guidelines, while the patient meets ICU status the initiation of lipids is being delayed for 7 days or  until transition out of ICU. Planned start date for lipids is 9/4.  Goal will be to meet 100% of the patient's protein needs and approximately 80% of their caloric needs.  Clinimix 5/20 @ 80 mL/hr to provide 96 grams protein (100% of goal) and 1689 kcal (100% of goal)  Glucose infusion rate will be 3.8 mg/kg/min (Maximum 5 mg/kg/min)   PLAN  Now: Mag 4g IV x1 dose  KPhos 10 mmol IV x1 dose (also provides 14.5 mEq K+)  KCl 10 meq / 65m IV (central line) x 6 runs.  (Total 74.5 mEq K today) Mag 4g IV x1 dose                                                                                                                          At 1800 today:  Continue Clinimix E 5/20 at 60 ml/hr.   Monitor I/O status, discussed titration of TPN with Dr BLamonte Sakai  Do not titrate up to goal rate today since goal is diurese of excess volume.    Currently meeting ~ 75% of nutritional goals.    TPN to contain standard multivitamins and trace elements.  Hold 20% fat emulsion while pt is ICU status x 7 days.  Start on 9/4.  Continue resistant  SSI q4h. Continue scheduled Novolog 4 units q4h per MD.  TPN lab panels on Mondays & Thursdays.  Daily BMET, Mag, Phos x3 days    F/u daily.   CGretta ArabPharmD, BCPS Pager [phone] 76349/06/2017 8:07 AM

## 2018-02-06 NOTE — Progress Notes (Signed)
CRITICAL VALUE ALERT  Critical Value:  Calcium 6.4  Date & Time Notied:  02/06/08 1130  Provider Notified: Dr. Lamonte Sakai notified at 1145 02/05/18  Mariann Laster, RN

## 2018-02-06 NOTE — Progress Notes (Signed)
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  She is completed 3 cycles of chemotherapy and is now status post bilateral salpingo-oophorectomy and omentectomy with debulking on 8/22. 8/23: Difficult with analgesic control.  This was complicated by patient becoming somnolent and hypotensive after adjustment of narcotics, treated with Saline and adjustment of medications. 8/24: Still somnolent, noted to be confused.  Is fused 1 unit for hemoglobin of 6.7.  Internal medicine consulted for acute encephalopathy also postoperative acute kidney injury.  8/25: Remains encephalopathic.  Hemoglobin improved posttransfusion.  Progress note commenting on dyspnea. Had episode of vomiting shortly after arriving in [place] unit.  More hypertensive and tachycardic. 8/26-8/30:8/26 transferred to ICU w/ concern for perforation w/ free air noted.  Imipenem started.  Went for exploratory laparotomy with washout.  No significant contamination identified, presumed spontaneously closed microperf. .  Returned with abdominal drains postoperative chest x-ray showing evidence of aspiration pneumonia; mixed respiratory and metabolic acidosis & in septic shock. 8/27: weaning pressors. Hgb dropped from 8.8 to 7.4; got 2 units of blood,  febrile, added. vancomycin.  Neupogen given for neutropenia. 8/28: Pressors off/shock resolved. NAG acidosis and Lactic acidosis resolved.  FiO2/PEEP down, attempted PSV..  WBC count up.  Started TPN. 8/29: FiO2 and PEEP requirements increased, had wean these down to 40/5 now 60/10.  Chest x-ray with worsening right-sided airspace disease likely reflecting de-recruitment. Delirium remains significant obstacle.  Beta-blocker, and clonidine patch added  for hypertension 8/30: Diuresed 4.5 L the day prior with Lasix.  Little more awake.  X-ray improved.  Vancomycin discontinued.  Continuing diuresis, RASS goal changed to -1, dropping PEEP to 8.  Resolved hospital Problems  Septic shock 8/28 AKI  Assessment & Plan:   Sepsis,  fever  with neutropenia: peritonitis (microperforation), complicated by aspiration pneumonia.  Febrile without any evidence new source Plan Day #6 Eraxis, day #7 meropenem Restart vancomycin empirically 9/1. Repeat her blood, urine, respiratory cultures to guide therapy If she remains febrile and I would favor CT scan of her abdomen and possibly also her chest to look for occult sources.   Status post exploratory laparotomy for free air on 8/26 ; following bilateral salpingo-oophorectomy, omentectomy, and debulking surgery on 8/22 Plan Possibly progressed to enteral feeds soon Continue TPN, I do not want to increase the rate is on trying to enact volume removal.   Routine wound and drain care  Acute hypoxic respiratory failure in the setting of aspiration pneumonia/ARDS Portable chest x-ray reviewed, stable bilateral infiltrates, overall improved since presentation Plan Continue PEEP 8 for now, wean FiO2 Given her respiratory pattern I do not think she is ready for spontaneous breathing.  She is been tachypneic and has an uncomfortable respiratory pattern since sedation was lightened Agree with hypertonic saline for secretion clearance Antibiotics adjusted as above We will restart diuresis 9/1 Continue to follow interval chest x-rays  Sinus tachycardia, hypertension, and grade 1 diastolic dysfunction with minimal troponin I bump.  Echo showed hyperdynamic ejection fraction with no wall motion abnormality Moderate control Plan Continue metoprolol Restart clonidine 9/1 Hydralazine as needed Lasix as blood pressure and renal function will tolerate Need to adjust sedation as I believe that agitation is at  least a contributor to her tachycardia  Acute metabolic encephalopathy.  Suspect this is multifactorial and secondary to sepsis as well as residual narcotics and metabolic acidosis, this remains a significant barrier. Plan PAD protocol RASS goal -1 to -2 Based on her appearance, tachycardia, tachypnea, blood pressure I do not think she is ready for lightening sedation.  This will be an important barrier to her overall progress.  Unclear whether were getting adequate control with combination Precedex plus fentanyl infusions.  I will increase the ceiling on the Precedex, continue the fentanyl and look for an improvement in her comfort if ineffective I may need to change to a different agent.   At risk for recurrent AKI.  Stable Plan Follow BMP with IV diuresis Strict I/O  Mild anion gap metabolic acidosis Check lactic acid  At risk for fluid and electrolyte imbalance: Anasarca, hypomagnesemia, hypokalemia, mild hypernatremia  Plan Fluids at [facility] Follow BMP Replace electrolytes as indicated  Postoperative anemia.  Does have coffee ground gastric tube output, this seems to have improved Hemoglobin stable Plan Follow CBC  Leukocytosis.  Initially was neutropenic however did receive Granix on 8/28 per oncology Plan Follow CBC  Thrombocytopenia Plan SCDs Follow CBC Consider add back Lovenox soon  Protein calorie malnutrition in the setting of cancer Plan TPN, hold off on increasing rate at this time  Hyperglycemia Plan Sliding scale insulin per protocol  Stage IV ovarian cancer, now status post bilateral salpingo-oophorectomy omentectomy and debulking surgery on 8/22 Plan Per GYN/Onc   Best practice/Goals of care/disposition.   DVT prophylaxis: LMWH-->SCD (8/26); continuing SCDs as of 8/29 given thrombocytopenia GI prophylaxis: NA-->H2B, changed to PPI on 8/27 Diet: NPO, starting TPN on 8/28 Mobility:BR Code Status: Full Code Family Communication: Thorough discussion  with the patient's family including husband, 3 sons, extended family by phone.  Also discussed the case with Dr. Denman George at bedside.  Disposition / Summary of Today's Plan 02/06/18     Consultants:  Internal medicine 8/25 Critical care 8/26 General surgery 8/26 GI medicine 8/27: They did not feel she had overt GI bleed.  Recommended prophylaxis with PPI.  Signed off. Procedures: bilateral salpingo-nephrectomy omentectomy and debulking surgery on 8/22 8/26: Intubation 8/26: Exploratory laparotomy, washout, placement of right upper quadrant x2 JP drain and left lower quadrant JP drain  Significant Diagnostic Tests: CT abdomen 8/26: Hemic bowel with portal venous gas and bowel pneumatosis with large amount of pneumoperitoneum.  Unclear as to site of perforation.  Multiple pockets of air adjacent to the sigmoid colon.  Enhancement of the peritoneum concerning for peritonitis.  There was aortic arterial sclerosis. Echocardiogram 8/27: Particular ejection fraction 65 to 70%.  She has moderate LVH, and hyperdynamic LV function.  There is grade 1 diastolic dysfunction.  Trivial MR.  Normal left atrial size.  The IV C was not visualized  Micro Data: Blood Culture times two 8/26>>> Sputum 8/27: Abundant yeast, few Candida albicans Blood Cx 9/1 >>   Antimicrobials:  Meropenem 8/26>>> Vancomycin 8/27>>> 8/30; 9/1 >>  Eraxis 8/27>>>   Subjective/interval events  She has had a rocky 48 hours with recurrent fevers, labile sedation and blood pressure with both highs and lows.  As sedation is been titrated up her clonidine, Lasix were held.  She has a mild anion gap which is new. No significant foci of infection identified, no increased sputum production, purulent sputum, no changes in the drainage from her surgical drains.  Objective    Examination: General: Ill-appearing elderly woman on mechanical ventilation.  Appears quite uncomfortable HEENT mild scleral edema, ET tube in  good  position Pulmonary: Uncomfortable respiratory pattern, small agonal breaths, tachypneic, bilateral coarse breath sounds without wheezing Cardiac: Intermittent tachycardia, no murmur Abdomen: Dressings clean, dry, JP drains in place, serous fluid, positive bowel sounds GU: Foley catheter in place Neuro: Unresponsive to voice, threat, pain Extremities: Anasarca  Blood pressure (!) 176/51, pulse (!) 130, temperature (!) 101.5 F (38.6 C), resp. rate (!) 33, height 5\' 3"  (1.6 m), weight 65.3 kg, SpO2 96 %. CVP:  [6 mmHg-11 mmHg] 6 mmHg  Vent Mode: PRVC FiO2 (%):  [40 %] 40 % Set Rate:  [20 bmp] 20 bmp Vt Set:  [330 mL] 330 mL PEEP:  [8 cmH20] 8 cmH20 Plateau Pressure:  [12 cmH20-18 cmH20] 17 cmH20   Intake/Output Summary (Last 24 hours) at 02/06/2018 0957 Last data filed at 02/06/2018 0900 Gross per 24 hour  Intake 5170.08 ml  Output 2429 ml  Net 2741.08 ml   Filed Weights   02/04/18 0500 02/05/18 0530 02/06/18 0541  Weight: 66.2 kg 66.6 kg 65.3 kg     Labs   CBC: Recent Labs  Lab 02/02/18 0455 02/03/18 0410 02/04/18 0407 02/04/18 0623 02/05/18 0518  WBC 27.9* 36.7* SPECIMEN CONTAMINATED, UNABLE TO PERFORM TEST(S). 23.3* 14.3*  NEUTROABS 26.2 34.8* SPECIMEN CONTAMINATED, UNABLE TO PERFORM TEST(S). 20.7* 11.6*  HGB 11.9* 12.2 SPECIMEN CONTAMINATED, UNABLE TO PERFORM TEST(S). 11.1* 10.9*  HCT 35.0* 35.3* SPECIMEN CONTAMINATED, UNABLE TO PERFORM TEST(S). 33.9* 34.2*  MCV 76.8* 76.2* SPECIMEN CONTAMINATED, UNABLE TO PERFORM TEST(S). 79.0 82.0  PLT 62* 57* SPECIMEN CONTAMINATED, UNABLE TO PERFORM TEST(S). 52* 65*   Basic Metabolic Panel: Recent Labs  Lab 02/03/18 0410  02/04/18 0407 02/04/18 0612 02/04/18 1800 02/05/18 0518 02/05/18 0533 02/05/18 1615  NA 147*   < > SPECIMEN CONTAMINATED, UNABLE TO PERFORM TEST(S). 146* 147*  --  146* 143  K 3.6   < > SPECIMEN CONTAMINATED, UNABLE TO PERFORM TEST(S). 2.9* 3.4*  --  4.3 3.3*  CL 109   < > SPECIMEN CONTAMINATED, UNABLE TO  PERFORM TEST(S). 101 101  --  93* 89*  CO2 30   < > SPECIMEN CONTAMINATED, UNABLE TO PERFORM TEST(S). 33* 38*  --  40* 39*  GLUCOSE 208*   < > SPECIMEN CONTAMINATED, UNABLE TO PERFORM TEST(S). 203* 134*  --  166* 144*  BUN 23   < > SPECIMEN CONTAMINATED, UNABLE TO PERFORM TEST(S). 21 19  --  23 26*  CREATININE 0.70   < > SPECIMEN CONTAMINATED, UNABLE TO PERFORM TEST(S). 0.61 0.43*  --  0.62 0.61  CALCIUM 7.6*   < > SPECIMEN CONTAMINATED, UNABLE TO PERFORM TEST(S). 7.5* 7.5*  --  7.4* 7.7*  MG 1.3*  --  SPECIMEN CONTAMINATED, UNABLE TO PERFORM TEST(S). 1.2*  --  1.6*  --  2.1  PHOS 2.9  --  SPECIMEN CONTAMINATED, UNABLE TO PERFORM TEST(S). 4.2  --  3.9  --   --    < > = values in this interval not displayed.   GFR: Estimated Creatinine Clearance: 57.8 mL/min (by C-G formula based on SCr of 0.61 mg/dL). Recent Labs  Lab 01/31/18 0800  02/01/18 0130 02/01/18 2027  02/03/18 0410 02/04/18 0407 02/04/18 0623 02/05/18 0518  WBC  --    < > 1.6*  --    < > 36.7* SPECIMEN CONTAMINATED, UNABLE TO PERFORM TEST(S). 23.3* 14.3*  LATICACIDVEN 2.7*  --  3.3* 2.4*  --   --   --   --   --    < > =  values in this interval not displayed.   Liver Function Tests: Recent Labs  Lab 02/02/18 0455 02/03/18 0410  AST 32 31  ALT 18 19  ALKPHOS 102 143*  BILITOT 0.9 1.1  PROT 4.2* 4.6*  ALBUMIN 1.8* 1.7*   No results for input(s): LIPASE, AMYLASE in the last 168 hours. No results for input(s): AMMONIA in the last 168 hours. ABG    Component Value Date/Time   PHART 7.454 (H) 02/03/2018 0345   PCO2ART 44.9 02/03/2018 0345   PO2ART 52.6 (L) 02/03/2018 0345   HCO3 31.0 (H) 02/03/2018 0345   TCO2 17 (L) 01/31/2018 1728   ACIDBASEDEF 0.4 02/01/2018 0432   O2SAT 86.9 02/03/2018 0345    Coagulation Profile: Recent Labs  Lab 01/31/18 0850  INR 1.36   Cardiac Enzymes: Recent Labs  Lab 01/31/18 0850 01/31/18 2021 02/01/18 0130 02/01/18 0850 02/01/18 1605  TROPONINI <0.03 0.03* <0.03 0.04*  0.04*   HbA1C: No results found for: HGBA1C CBG: Recent Labs  Lab 02/05/18 1540 02/05/18 1954 02/05/18 2326 02/06/18 0333 02/06/18 0822  GLUCAP 142* 131* 141* 175* 113*    Independent CC time 50 minutes  Baltazar Apo, MD, PhD 02/06/2018, 9:57 AM  Pulmonary and Critical Care [phone] or if no answer [phone]

## 2018-02-06 NOTE — Progress Notes (Signed)
   02/06/18 0130  Vitals  Temp (!) 101.5 F (38.6 C)  BP (!) 204/72  MAP (mmHg) 117  Pulse Rate (!) 107  ECG Heart Rate (!) 115  Resp (!) 27  Oxygen Therapy  SpO2 100 %    Notified Dr. Gladys Damme new orders for tylenol, Ice packs applied. Patient also agitated after oral care, new order for versed. Will continue to monitor.

## 2018-02-07 ENCOUNTER — Inpatient Hospital Stay (HOSPITAL_COMMUNITY): Payer: Medicare PPO

## 2018-02-07 ENCOUNTER — Other Ambulatory Visit: Payer: Self-pay

## 2018-02-07 DIAGNOSIS — C482 Malignant neoplasm of peritoneum, unspecified: Secondary | ICD-10-CM

## 2018-02-07 LAB — COMPREHENSIVE METABOLIC PANEL
ALK PHOS: 100 U/L (ref 38–126)
ALT: 30 U/L (ref 0–44)
AST: 39 U/L (ref 15–41)
Albumin: 1.3 g/dL — ABNORMAL LOW (ref 3.5–5.0)
Anion gap: 8 (ref 5–15)
BILIRUBIN TOTAL: 0.6 mg/dL (ref 0.3–1.2)
BUN: 27 mg/dL — ABNORMAL HIGH (ref 8–23)
CALCIUM: 7 mg/dL — AB (ref 8.9–10.3)
CO2: 38 mmol/L — ABNORMAL HIGH (ref 22–32)
Chloride: 99 mmol/L (ref 98–111)
Creatinine, Ser: 0.5 mg/dL (ref 0.44–1.00)
GFR calc Af Amer: >60 mL/min (ref >60)
Glucose, Bld: 90 mg/dL (ref 70–99)
POTASSIUM: 3.9 mmol/L (ref 3.5–5.1)
Sodium: 145 mmol/L (ref 135–145)
TOTAL PROTEIN: 4.9 g/dL — AB (ref 6.5–8.1)

## 2018-02-07 LAB — CBC WITH DIFFERENTIAL/PLATELET
Basophils Absolute: 0 10*3/uL (ref 0.0–0.1)
Basophils Relative: 0 %
EOS PCT: 2 %
Eosinophils Absolute: 0.3 10*3/uL (ref 0.0–0.7)
HCT: 30.8 % — ABNORMAL LOW (ref 36.0–46.0)
HEMOGLOBIN: 9.7 g/dL — AB (ref 12.0–15.0)
LYMPHS PCT: 7 %
Lymphs Abs: 1 10*3/uL (ref 0.7–4.0)
MCH: 26.1 pg (ref 26.0–34.0)
MCHC: 31.5 g/dL (ref 30.0–36.0)
MCV: 83 fL (ref 78.0–100.0)
MONOS PCT: 5 %
Monocytes Absolute: 0.7 10*3/uL (ref 0.1–1.0)
NEUTROS ABS: 12.8 10*3/uL — AB (ref 1.7–7.7)
Neutrophils Relative %: 86 %
Platelets: 92 10*3/uL — ABNORMAL LOW (ref 150–400)
RBC: 3.71 MIL/uL — ABNORMAL LOW (ref 3.87–5.11)
RDW: 24 % — ABNORMAL HIGH (ref 11.5–15.5)
WBC: 14.8 10*3/uL — ABNORMAL HIGH (ref 4.0–10.5)

## 2018-02-07 LAB — BASIC METABOLIC PANEL
Anion gap: 13 (ref 5–15)
BUN: 29 mg/dL — ABNORMAL HIGH (ref 8–23)
CALCIUM: 7.7 mg/dL — AB (ref 8.9–10.3)
CO2: 39 mmol/L — AB (ref 22–32)
CREATININE: 0.61 mg/dL (ref 0.44–1.00)
Chloride: 86 mmol/L — ABNORMAL LOW (ref 98–111)
GFR calc non Af Amer: >60 mL/min (ref >60)
GLUCOSE: 149 mg/dL — AB (ref 70–99)
Potassium: 4 mmol/L (ref 3.5–5.1)
Sodium: 138 mmol/L (ref 135–145)

## 2018-02-07 LAB — URINE CULTURE: Culture: NO GROWTH

## 2018-02-07 LAB — GLUCOSE, CAPILLARY
GLUCOSE-CAPILLARY: 121 mg/dL — AB (ref 70–99)
GLUCOSE-CAPILLARY: 126 mg/dL — AB (ref 70–99)
GLUCOSE-CAPILLARY: 132 mg/dL — AB (ref 70–99)
GLUCOSE-CAPILLARY: 151 mg/dL — AB (ref 70–99)
Glucose-Capillary: 101 mg/dL — ABNORMAL HIGH (ref 70–99)
Glucose-Capillary: 115 mg/dL — ABNORMAL HIGH (ref 70–99)

## 2018-02-07 LAB — TRIGLYCERIDES: Triglycerides: 39 mg/dL (ref <150)

## 2018-02-07 LAB — TROPONIN I: TROPONIN I: 0.05 ng/mL — AB (ref <0.03)

## 2018-02-07 LAB — MAGNESIUM
Magnesium: 1.7 mg/dL (ref 1.7–2.4)
Magnesium: 2.1 mg/dL (ref 1.7–2.4)

## 2018-02-07 LAB — PREALBUMIN: PREALBUMIN: 3.5 mg/dL — AB (ref 18–38)

## 2018-02-07 LAB — LACTIC ACID, PLASMA: LACTIC ACID, VENOUS: 0.8 mmol/L (ref 0.5–1.9)

## 2018-02-07 LAB — PHOSPHORUS: PHOSPHORUS: 4 mg/dL (ref 2.5–4.6)

## 2018-02-07 MED ORDER — LACTATED RINGERS IV BOLUS
500.0000 mL | Freq: Once | INTRAVENOUS | Status: AC
  Administered 2018-02-07: 500 mL via INTRAVENOUS

## 2018-02-07 MED ORDER — ASPIRIN 81 MG PO CHEW
81.0000 mg | CHEWABLE_TABLET | Freq: Every day | ORAL | Status: DC
  Administered 2018-02-07: 81 mg
  Filled 2018-02-07: qty 1

## 2018-02-07 MED ORDER — MAGNESIUM SULFATE 2 GM/50ML IV SOLN
2.0000 g | Freq: Once | INTRAVENOUS | Status: AC
  Administered 2018-02-07: 2 g via INTRAVENOUS
  Filled 2018-02-07: qty 50

## 2018-02-07 MED ORDER — IOHEXOL 300 MG/ML  SOLN
100.0000 mL | Freq: Once | INTRAMUSCULAR | Status: AC | PRN
  Administered 2018-02-07: 100 mL via INTRAVENOUS

## 2018-02-07 MED ORDER — POTASSIUM CHLORIDE 10 MEQ/50ML IV SOLN
10.0000 meq | INTRAVENOUS | Status: AC
  Administered 2018-02-07 (×2): 10 meq via INTRAVENOUS
  Filled 2018-02-07 (×2): qty 50

## 2018-02-07 MED ORDER — IOHEXOL 300 MG/ML  SOLN
15.0000 mL | Freq: Once | INTRAMUSCULAR | Status: DC | PRN
  Administered 2018-02-07: 30 mL via ORAL
  Filled 2018-02-07: qty 20

## 2018-02-07 MED ORDER — TRACE MINERALS CR-CU-MN-SE-ZN 10-1000-500-60 MCG/ML IV SOLN
INTRAVENOUS | Status: AC
  Administered 2018-02-07: 17:00:00 via INTRAVENOUS
  Filled 2018-02-07: qty 1440

## 2018-02-07 NOTE — Progress Notes (Signed)
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  She is completed 3 cycles of chemotherapy and is now status post bilateral salpingo-oophorectomy and omentectomy with debulking on 8/22. 8/23: Difficult with analgesic control.  This was complicated by patient becoming somnolent and hypotensive after adjustment of narcotics, treated with Saline and adjustment of medications. 8/24: Still somnolent, noted to be confused.  Is fused 1 unit for hemoglobin of 6.7.  Internal medicine consulted for acute encephalopathy also postoperative acute kidney injury.  8/25: Remains encephalopathic.  Hemoglobin improved posttransfusion.  Progress note commenting on dyspnea. Had episode of vomiting shortly after arriving in [place] unit.  More hypertensive and tachycardic. 8/26-8/30:8/26 transferred to ICU w/ concern for perforation w/ free air noted.  Imipenem started.  Went for exploratory laparotomy with washout.  No significant contamination identified, presumed spontaneously closed microperf. .  Returned with abdominal drains postoperative chest x-ray showing evidence of aspiration pneumonia; mixed respiratory and metabolic acidosis & in septic shock. 8/27: weaning pressors. Hgb dropped from 8.8 to 7.4; got 2 units of blood,  febrile, added. vancomycin.  Neupogen given for neutropenia. 8/28: Pressors off/shock resolved. NAG acidosis and Lactic acidosis resolved.  FiO2/PEEP down, attempted PSV..  WBC count up.  Started TPN. 8/29: FiO2 and PEEP requirements increased, had wean these down to 40/5 now 60/10.  Chest x-ray with worsening right-sided airspace disease likely reflecting de-recruitment. Delirium remains significant obstacle.  Beta-blocker, and clonidine patch added  for hypertension 8/30: Diuresed 4.5 L the day prior with Lasix.  Little more awake.  X-ray improved.  Vancomycin discontinued.  Continuing diuresis, RASS goal changed to -1, dropping PEEP to 8.  Resolved hospital Problems  Septic shock 8/28 AKI  Assessment & Plan:   Sepsis,  fever  with neutropenia: peritonitis (microperforation), complicated by aspiration pneumonia.  Febrile without any evidence new source 8/31 through 9/2 Plan Day #7 Eraxis, day #8 meropenem Vancomycin added back 9/1, day #2 (day #5 total)  Follow blood cultures, respiratory culture, urine culture I believe she needs a CT scan of her abdomen today to look for possible occult source.  Will review with gynecological surgery once available.  Status post exploratory laparotomy for free air on 8/26 ; following bilateral salpingo-oophorectomy, omentectomy, and debulking surgery on 8/22 Plan May possibly progress to enteral feedings soon Continue TPN, same rate.  I do not want to add additional volume at this time Routine wound and drain care  Acute hypoxic respiratory failure in the setting of aspiration pneumonia/ARDS Portable chest x-ray 9/2 reviewed, stable bilateral infiltrates, overall improved since presentation Plan FiO2 has been weaned to 0.30.  Consider decrease PEEP to 5 on 9/2 after she returns from CT abdomen Do not believe she is ready for spontaneous breathing at this time.  Need to continue to try and stabilize any potential persistent infection, continue with volume removal.  I explained to family in detail that she may never get to a point where she will be an extubation candidate given both her acute and chronic illnesses. Agree with hypertonic saline for secretion clearance Antibiotics as above Continue diuresis as her blood pressure and renal function will tolerate Follow interval chest x-rays and ABG  Sinus tachycardia, hypertension, and grade 1 diastolic dysfunction with minimal troponin I bump.  Echo showed hyperdynamic ejection fraction with no wall motion abnormality Moderate control Plan Continue scheduled metoprolol, clonidine patch.  Hydralazine as needed for any spikes in blood pressure Continue current diuresis as blood pressure will tolerate Blood pressure stable on current level of sedation  Acute metabolic encephalopathy.  Suspect this is multifactorial and secondary to sepsis as well as residual narcotics and metabolic acidosis, this remains a significant barrier. Plan PAD protocol RASS goal -1 to -2 Improved sedation on current Precedex and fentanyl infusions.  Continue same.  At risk for recurrent AKI.  Stable Plan Follow BMP with IV diuresis Strict I/O  Mild anion gap metabolic acidosis Lactic acid reassuring 9/2  At risk for fluid and electrolyte imbalance: Anasarca, hypomagnesemia, hypokalemia, mild hypernatremia  Plan Fluids at [facility] Follow BMP Replace electrolytes as indicated  Malnutrition, prealbumin 3.5 TPN currently meeting approximately 75% of her caloric needs Possible enteral feeds soon if no evidence of intra-abdominal infectious process  Postoperative anemia.  Does have coffee ground gastric tube output, this seems to have improved Hemoglobin stable Plan Follow CBC  Leukocytosis.  Initially was neutropenic however did receive Granix on 8/28 per oncology; stable Plan Follow CBC  Thrombocytopenia; stable Plan SCDs Follow CBC Consider add back Lovenox soon, need to ensure that she will not need procedure, percutaneous drain, etc.  Protein calorie malnutrition in the setting of cancer Plan TPN, hold off on increasing rate at this time  Hyperglycemia Plan Sliding scale insulin per protocol  Stage IV ovarian cancer, now status post bilateral salpingo-oophorectomy omentectomy and debulking surgery on 8/22 Plan Appreciate GYN/ONC management  Best practice/Goals of care/disposition.   DVT prophylaxis: LMWH-->SCD (8/26); continuing  SCDs as of 8/29 given thrombocytopenia GI prophylaxis: NA-->H2B, changed to PPI on 8/27 Diet: NPO, starting TPN on 8/28 Mobility:BR Code Status: Full Code Family Communication: Updated patient's husband and son at bedside on 9/2  Disposition / Summary of Today's Plan 02/07/18   Continue current level of sedation. Continue diuresis as renal function and blood pressure will tolerate Broad-spectrum antibiotics as ordered Obtain CT scan of the abdomen/pelvis to ensure no evidence of occult infection given persistent fevers  Consultants:  Internal medicine 8/25 Critical care 8/26 General surgery 8/26 GI medicine 8/27: They did not feel she had overt GI bleed.  Recommended prophylaxis with PPI.  Signed off. Procedures: bilateral salpingo-nephrectomy omentectomy and debulking surgery on 8/22 8/26: Intubation 8/26: Exploratory laparotomy, washout, placement of right upper quadrant x2 JP drain and left lower quadrant JP drain  Significant Diagnostic Tests: CT abdomen 8/26: Hemic bowel with portal venous gas and bowel pneumatosis with large amount of pneumoperitoneum.  Unclear as to site of perforation.  Multiple pockets of air adjacent to the sigmoid colon.  Enhancement of the peritoneum concerning for peritonitis.  There was aortic arterial sclerosis.  Echocardiogram 8/27: Particular ejection fraction 65 to 70%.  She has moderate LVH, and hyperdynamic LV function.  There is grade 1 diastolic dysfunction.  Trivial MR.  Normal left atrial size.  The IV C was not visualized  CT abdomen 9/2 >>   Micro Data: Blood Culture times two 8/26>>> Sputum 8/27: Abundant yeast, few Candida albicans Blood Cx 9/1 >>  Respiratory 9/1 >> Urine 9/1 >>   Antimicrobials:  Meropenem 8/26>>> Vancomycin 8/27>>> 8/30; 9/1 >>  Eraxis 8/27>>>   Subjective/interval events  Better sedation, fewer periods of hyper or hypotension Both tachypnea and tachycardia are improved Clonidine patch was added back on  9/1 Vancomycin was added back on 9/1 for  fevers  Objective    Examination: General: Ill-appearing elderly woman on mechanical ventilation, less tachypneic and less uncomfortable in appearance on current sedation HEENT scleral edema ET tube in good position, oropharynx without any apparent lesions Pulmonary: More comfortable respiratory pattern, scattered coarse breath sounds, no wheezing Cardiac: no longer tachycardic, regular, no murmur Abdomen: Dressing clean, dry, JP change in place with serous fluid, positive bowel sounds GU: Foley in place Neuro: Unresponsive to voice, pain Extremities: No lower extremity edema  Blood pressure (!) 104/42, pulse (!) 101, temperature (!) 101.8 F (38.8 C), temperature source Core, resp. rate (!) 27, height 5\' 3"  (1.6 m), weight 63.2 kg, SpO2 100 %.    Vent Mode: PRVC FiO2 (%):  [30 %] 30 % Set Rate:  [20 bmp] 20 bmp Vt Set:  [330 mL] 330 mL PEEP:  [8 cmH20] 8 cmH20 Plateau Pressure:  [14 cmH20-18 cmH20] 14 cmH20   Intake/Output Summary (Last 24 hours) at 02/07/2018 0820 Last data filed at 02/07/2018 0800 Gross per 24 hour  Intake 3449.87 ml  Output 6160 ml  Net -2710.13 ml   Filed Weights   02/05/18 0530 02/06/18 0541 02/07/18 0406  Weight: 66.6 kg 65.3 kg 63.2 kg     Labs   CBC: Recent Labs  Lab 02/04/18 0407 02/04/18 0623 02/05/18 0518 02/06/18 1037 02/07/18 0327  WBC SPECIMEN CONTAMINATED, UNABLE TO PERFORM TEST(S). 23.3* 14.3* 17.0* 14.8*  NEUTROABS SPECIMEN CONTAMINATED, UNABLE TO PERFORM TEST(S). 20.7* 11.6* 15.0* 12.8*  HGB SPECIMEN CONTAMINATED, UNABLE TO PERFORM TEST(S). 11.1* 10.9* 9.8* 9.7*  HCT SPECIMEN CONTAMINATED, UNABLE TO PERFORM TEST(S). 33.9* 34.2* 31.4* 30.8*  MCV SPECIMEN CONTAMINATED, UNABLE TO PERFORM TEST(S). 79.0 82.0 82.6 83.0  PLT SPECIMEN CONTAMINATED, UNABLE TO PERFORM TEST(S). 52* 65* 81* 92*   Basic Metabolic Panel: Recent Labs  Lab 02/03/18 0410  02/04/18 0407 02/04/18 0612 02/04/18 1800  02/05/18 0518 02/05/18 0533 02/05/18 1615 02/06/18 1037 02/07/18 0327  NA 147*   < > SPECIMEN CONTAMINATED, UNABLE TO PERFORM TEST(S). 146* 147*  --  146* 143 146* 145  K 3.6   < > SPECIMEN CONTAMINATED, UNABLE TO PERFORM TEST(S). 2.9* 3.4*  --  4.3 3.3* 3.2* 3.9  CL 109   < > SPECIMEN CONTAMINATED, UNABLE TO PERFORM TEST(S). 101 101  --  93* 89* 102 99  CO2 30   < > SPECIMEN CONTAMINATED, UNABLE TO PERFORM TEST(S). 33* 38*  --  40* 39* 35* 38*  GLUCOSE 208*   < > SPECIMEN CONTAMINATED, UNABLE TO PERFORM TEST(S). 203* 134*  --  166* 144* 247* 90  BUN 23   < > SPECIMEN CONTAMINATED, UNABLE TO PERFORM TEST(S). 21 19  --  23 26* 24* 27*  CREATININE 0.70   < > SPECIMEN CONTAMINATED, UNABLE TO PERFORM TEST(S). 0.61 0.43*  --  0.62 0.61 0.45 0.50  CALCIUM 7.6*   < > SPECIMEN CONTAMINATED, UNABLE TO PERFORM TEST(S). 7.5* 7.5*  --  7.4* 7.7* 6.4* 7.0*  MG 1.3*  --  SPECIMEN CONTAMINATED, UNABLE TO PERFORM TEST(S). 1.2*  --  1.6*  --  2.1 1.5*  --   PHOS 2.9  --  SPECIMEN CONTAMINATED, UNABLE TO PERFORM TEST(S). 4.2  --  3.9  --   --  2.2*  --    < > = values in this interval not displayed.   GFR: Estimated Creatinine Clearance: 56.9 mL/min (by C-G formula based on SCr of 0.5 mg/dL). Recent Labs  Lab 02/01/18 0130 02/01/18 2027  02/04/18 2423 02/05/18 0518  02/06/18 1010 02/06/18 1037 02/07/18 0327 02/07/18 0342  WBC 1.6*  --    < > 23.3* 14.3*  --  17.0* 14.8*  --   LATICACIDVEN 3.3* 2.4*  --   --   --  1.3  --   --  0.8   < > = values in this interval not displayed.   Liver Function Tests: Recent Labs  Lab 02/02/18 0455 02/03/18 0410 02/07/18 0327  AST 32 31 39  ALT 18 19 30   ALKPHOS 102 143* 100  BILITOT 0.9 1.1 0.6  PROT 4.2* 4.6* 4.9*  ALBUMIN 1.8* 1.7* 1.3*   No results for input(s): LIPASE, AMYLASE in the last 168 hours. No results for input(s): AMMONIA in the last 168 hours. ABG    Component Value Date/Time   PHART 7.454 (H) 02/03/2018 0345   PCO2ART 44.9 02/03/2018  0345   PO2ART 52.6 (L) 02/03/2018 0345   HCO3 31.0 (H) 02/03/2018 0345   TCO2 17 (L) 01/31/2018 1728   ACIDBASEDEF 0.4 02/01/2018 0432   O2SAT 86.9 02/03/2018 0345    Coagulation Profile: Recent Labs  Lab 01/31/18 0850  INR 1.36   Cardiac Enzymes: Recent Labs  Lab 01/31/18 0850 01/31/18 2021 02/01/18 0130 02/01/18 0850 02/01/18 1605  TROPONINI <0.03 0.03* <0.03 0.04* 0.04*   HbA1C: No results found for: HGBA1C CBG: Recent Labs  Lab 02/06/18 1641 02/06/18 2011 02/06/18 2358 02/07/18 0323 02/07/18 0715  GLUCAP 221* 139* 151* 101* 121*    Independent CC time 32 minutes  Baltazar Apo, MD, PhD 02/07/2018, 8:20 AM [facility] [phone] or if no answer [phone]

## 2018-02-07 NOTE — Progress Notes (Signed)
Dr Milon Dikes to see pt.  Pts BP in mid 80's/50's.  Order to give LR bolus and hold next two doses of lasix.  Will monitor closely.

## 2018-02-07 NOTE — Progress Notes (Signed)
[place] Progress Note Patient Name: Jo Parker DOB: 08/12/1945 MRN: [id number]   Date of Service  02/07/2018  HPI/Events of Note  Troponin = 0.05 (Note that it was 0.04 on 02/01/2018).   eICU Interventions  Will order: 1. ASA 81 mg per tube now and Q day. 2. Continue to trend Troponin.     Intervention Category Intermediate Interventions: Diagnostic test evaluation  Lysle Dingwall 02/07/2018, 7:36 PM

## 2018-02-07 NOTE — Progress Notes (Signed)
Pts bp continues to be labile will monitor closely.

## 2018-02-07 NOTE — Progress Notes (Signed)
Gynecologic Oncology Progress Note  72 year old female diagnosed with Stage IV ovarian cancer who received four cycles of neoadjuvant chemotherapy with carboplatin and taxol with fourth cycle completed on 12/20/17 .  She underwent interval debulking surgery with Dr. Skeet Latch on 01/27/18 consisting of an exploratory laparotomy infra-gastric omentectomy bilateral salpingo-oophorectomy radical optimal tumor debulking, resection of anterior abdominal wall.  Her post-operative course was complicated by altered mental status noted on POD1 with improvement after Narcan administration but with return of symptoms, acute renal injury, tachycardia, dyspnea (CT chest performed 01/30/18-neg for PE), anemia.  Her AKI and altered mental status appeared to improve on 01/30/18 in the am but symptoms worsened the evening of 01/30/18 with episodes of emesis, tachycardia, hypertension, and confusion. A CT scan on 01/31/18 revealed findings most consistent with ischemic bowel with portal venous gas and bowel pneumatosis and large amount of pneumoperitoneum with concerns for peritonitis.  She was taken back to the OR on 01/31/18 with operative findings of free air and large volume ascites/fluid, but unable to visualize site of bowel perforation, no evidence of ischemia. It was felt that aspiration most likely occurred during intubation.        Subjective: Patient intubated. Husband at the bedside.     Objective: Vital signs in last 24 hours: Temp:  [99.3 F (37.4 C)-102.4 F (39.1 C)] 101.5 F (38.6 C) (09/02 0830) Pulse Rate:  [80-136] 88 (09/02 0830) Resp:  [18-35] 24 (09/02 0830) BP: (87-178)/(35-112) 87/37 (09/02 0830) SpO2:  [95 %-100 %] 99 % (09/02 0830) FiO2 (%):  [30 %] 30 % (09/02 0810) Weight:  [139 lb 5.3 oz (63.2 kg)] 139 lb 5.3 oz (63.2 kg) (09/02 0406) Last BM Date: 02/07/18  Intake/Output from previous day: 09/01 0701 - 09/02 0700 In: 3449.9 [I.V.:2213.6; IV Piggyback:1236.3] Out: 4885 [Urine:4310;  Emesis/NG output:450; Drains:125]  Physical Examination: General: appears comfortable, does not respond to voice or vigorous stimulus. Resp: coarse breath sounds bilaterally, rhonchi noted Cardio: heart rate at 87, regular rhythm GI: incision: midline incision with gauze dressings, clean, no active drainage noted and abdomen slightly distended, hypoactive bowel sounds Extremities: generalized edema in all extrem JP drain x3 in the abdomen charged. Minimal serous drainage noted in all three. Foley with clear, no longer blood-tinged urine   Labs: WBC/Hgb/Hct/Plts:  14.8/9.7/30.8/92 (09/02 0327) BUN/Cr/glu/ALT/AST/amyl/lip:  27/0.50/--/30/39/--/-- (09/02 0327)   Assessment: 72 y.o. s/p Procedure(s): Interval debulking surgery with Dr. Skeet Latch on 01/27/18 consisting of an exploratory laparotomy infra-gastric omentectomy bilateral salpingo-oophorectomy radical optimal tumor debulking, resection of anterior abdominal wall.  Reoperation of EXPLORATORY LAPAROTOMY, ABDOMINAL WASHOUT on 01/31/18: critically ill  Heme: Post-operative anemia. Hgb 9.7 which is stable though downward trending, has not required transfusion since 02/02/18.  Anemia related to acute critical illneess, no evidence of active bleeding. Remains thrombocytopenic but stable and slowly improving.   ID: Fever persistent, Tmax to 102 on 02/06/18. Presumed sepsis initially from gastrointestinal source but unable to ID the perforation after exp lap on 01/31/18. Contribution now from pneumonia.  WBC stable/improved from yesterday Granix received 02/01/18 per Dr. Alvy Bimler.   Vancomycin stopped on 02/04/18 and then fevers began again. Dr Lamonte Sakai restarted Vancomycin on 02/06/18. Anidulafungin initiated 02/01/18.  Meropenum IV started 01/31/18.  Blood cultures from 01/31/18 & 02/06/18 with no growth.  Tracheal aspirate from 02/01/18 resulting few gram positive rods. Repeated 02/06/18 showed few yeast. JP drain culture from 02/05/18 no growth to date.  CT  abd/pelvis today to look for undrained intraabdominal collections.  CV: Tachycardia and hypertension  improved. BP sensitive with changes in sedation.  Critical care team are aggressively managing BP through hydralazine and sedation alterations. Likely some of the underlying hypotension on sedation is secondary to aggressive diuresis in setting of ARDS and pulm edema. Though still net positive volume status.  Resp: ARDS in setting of aspiration pneumonia and sepsis.   Chest 1 view this am: Stable multifocal pneumonia.   She has continued to make ventilator gains though not yet ready for spontaneous breathing as she has apparent increased WOB with reductions in sedation.  GI:  Tolerating po: No: intubated.  Bowel movement on 02/06/18, suggestive of return of bowel function which is promising. Protonix ordered. GI consultation appreciated.  CT AP on 01/31/18 with findings consistent with GI perforation.  No obvious perforation identified on re-operation on 01/31/18. NG with brown output (150cc in canister). TPN initiated 02/02/18. Will obtain CT abd/pelvis and if no evidence of ongoing perforation, would consider changing to enteric feeds and stopping TPN.  If tolerates this, will remove her JP drains.   GU: AKI post-op resolved. Creatinine 0.5 02/07/18. Foley in place.  Plan for continued diuresis today per CC (as tolerated by BP).  FEN: No critical values. Replacement today per CC.   Prophylaxis: SCDs in place. Anticoagulation on hold due to persistent thrombocytopenia from sepsis and possible need for intervention pending CT findings. If no drainable collections seen, would consider starting Lovenox prophylaxis. No active bleeding and H&H stable.  Plan: Continue care per Critical Care. Persistently febrile. Agree with plan to continute vanc in addition to merepenim and antifungal. Likely sources for fevers include pneumonia and intraperitoneal source. Will rescan abd/pelvis to look for undrained  fluid collections that appear infected.  Plan to hold on removal of JP drains at this time per given fevers (would consider pulling them after repeat CT abd/pelvis shows no active leak and have started enteric feeds with no clinical leak).  Maintain NPO status for now. Continue TPN per pharmacy.  I spoke with her husband and son today regarding our plan and her progress.  I spoke with Dr Lamonte Sakai and her RN regarding her status and plan.   Thereasa Solo 02/07/2018, 9:51 AM

## 2018-02-07 NOTE — Progress Notes (Signed)
CRITICAL VALUE ALERT  Critical Value:  0.05 troponin   Date & Time Notied:   1900 02/07/2018  Provider Notified: e link called   Orders Received/Actions taken:  nurse stated doctor was not in yet and that she would leave a message for him

## 2018-02-07 NOTE — Progress Notes (Addendum)
[place] NOTE   Pharmacy Consult for TPN Indication: bowel perforation  Patient Measurements: Height: 5\' 3"  (160 cm) Weight: 139 lb 5.3 oz (63.2 kg) IBW/kg (Calculated) : 52.4 TPN AdjBW (KG): 58.3 Body mass index is 24.68 kg/m.  Insulin Requirements: 21 units resistant SSI (no meds PTA) yesterday  - Scheduled Novolog 4 units q4h (started 8/30 per MD) = 24 units/ 24 hrs  Current Nutrition: NPO   IVF: D5W @ 10 ml/hr - Also on continuous Precedex at 28 ml/hr, Fentanyl at 7.5 ml/hr - Furosemide 60 mg IV q8h (aggressive diuresis)  Central access: Implanted port 10/14/17, triple lumen PICC 02/01/18 TPN start date: 02/02/2018  ASSESSMENT                                                                                                          HPI: Pt is a 72 year old with ovarian cancer status post bilateral salpingo-oophorectomy and omentectomy with debulking on 8/22. Ex lap on 8/26. Pt remains in ICU critically ill on pressors, vent support.  She has had poor appetite and poor PO intakes since admission and for unknown amount of time PTA and will be at risk for refeeding syndrome.  Significant events:  - 8/27 Start TPN. Hold lipids for first 7 days while pt in ICU  - 8/30 scheduled Novolog insulin added q4h  Today:   Glucose - CBGs mostly controlled with scheduled Novolog and SSI (630-160). No history of DM  Electrolytes - All WNL after supplementation  Renal - SCr low/stable, BUN/SCr elevated, bicarb remains elevated  I/O: Lasix IV 60 tid (holding some doses d/t low BP); appears to be diuresing well with net I/O 3L, down from >10L earlier this admit  LFTs - transaminitis resolved, albumin low  TGs - WNL  Prealbumin - low and decreasing  NUTRITIONAL GOALS                                                                                             RD recs (8/27): Kcal:  1686, Protein:  87-99 grams (1.5-1.7 grams/kg), Fluid:  >/= 1.5  L/day  Based on published guidelines, while the patient meets ICU status the initiation of lipids is being delayed for 7 days or until transition out of ICU. Planned start date for lipids is 9/4.  Goal will be to meet 100% of the patient's protein needs and approximately 80% of their caloric needs.  Clinimix 5/20 @ 80 mL/hr to provide 96 grams protein (100% of goal) and 1689 kcal (100% of goal)  Glucose infusion rate will be 3.8 mg/kg/min (Maximum 5 mg/kg/min)   PLAN   Mag 2g IV x 1  Replete potassium  with ongoing Lasix diuresis; KCl 10 mEq IV x2 given fluid restrictions (if CT scan OK and able to use NGT, will give 40 mEq)                                                                                      At 1800 today:  Continue Clinimix E 5/20 at 60 ml/hr.   Monitor I/O status, discussed titration of TPN with Dr Lamonte Sakai.  Do not titrate up to goal rate today since goal is diurese of excess volume.    Currently meeting ~ 75% of nutritional goals.    TPN to contain standard multivitamins and trace elements.  Hold 20% fat emulsion while pt is ICU status x 7 days.  Start on 9/4.  Continue resistant SSI q4h. Continue scheduled Novolog 4 units q4h per MD.  TPN lab panels on Mondays & Thursdays.  BMP, Mg, tomorrow  F/u daily.   Reuel Boom, PharmD, BCPS [phone] 02/07/2018, 8:45 AM

## 2018-02-07 NOTE — Care Management Note (Signed)
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  She is completed 3 cycles of chemotherapy and is now status post bilateral salpingo-oophorectomy and omentectomy with debulking on 8/22. 8/23: Difficult with analgesic control.  This was complicated by patient becoming somnolent and hypotensive after adjustment of narcotics, treated with Saline and adjustment of medications. 8/24: Still somnolent, noted to be confused.  Is fused 1 unit for hemoglobin of 6.7.  Internal medicine consulted for acute encephalopathy also postoperative acute kidney injury.  8/25: Remains encephalopathic.  Hemoglobin improved posttransfusion.  Progress note commenting on dyspnea. Had episode of vomiting shortly after arriving in [place] unit.  More hypertensive and tachycardic. 8/26-8/30:8/26 transferred to ICU w/ concern for perforation w/ free air noted.  Imipenem started.  Went for exploratory laparotomy with washout.  No significant contamination identified, presumed spontaneously closed microperf. .  Returned with abdominal drains postoperative chest x-ray showing evidence of aspiration pneumonia; mixed respiratory and metabolic acidosis & in septic shock. 8/27: weaning pressors. Hgb dropped from 8.8 to 7.4; got 2 units of blood,  febrile, added. vancomycin.  Neupogen given for neutropenia. 8/28: Pressors off/shock resolved. NAG acidosis and Lactic acidosis resolved.  FiO2/PEEP down, attempted PSV..  WBC count up.  Started TPN. 8/29: FiO2 and PEEP requirements increased, had wean these down to 40/5 now 60/10.  Chest x-ray with worsening right-sided airspace disease likely reflecting de-recruitment. Delirium remains significant obstacle.  Beta-blocker, and clonidine patch added for hypertension 8/30: Diuresed 4.5 L the day prior  with Lasix.  Little more awake.  X-ray improved.  Vancomycin discontinued.  Continuing diuresis, RASS goal changed to -1, dropping PEEP to 8. Discharge Criteria Return to top of Respiratory Failure GRG - GRG [Expand All / Collapse All]  Continued inpatient stay is needed until 1 or more of the following are present: ? Acceptable patient status for next level of care is achieved. ? ALL of the following are present:  Weaning assessment performed   Weaning trials attempted weaning started    Ventilation status appropriate yes remains on full vent support    Airway status acceptable yes stable    Respiratory status acceptable vent support   Stable chest findings 0There patchy infiltrates throughout the lungs bilaterally, more confluent at the bases. The degree of opacity is  essentially stable compared with recent studies.[id number]    No chest tube, or status acceptable none    Neurologic status acceptable sedated on vent    Temperature status acceptable yes    No infection, or status acceptable  Wbc=14.8    Activity level acceptable bed rest    Intake acceptable n/a    No inpatient interventions needed n/a    General Discharge Criteria met no  Action/Plan: Will follow for progression Will follow for cm needs-snf placement due to prolonged post op complications?  Expected Discharge Date:  01/29/18               Expected Discharge Plan:  Home/Self Care  In-House Referral:     Discharge planning Services  CM Consult  Post Acute Care Choice:    Choice offered to:     DME Arranged:    DME Agency:     HH Arranged:    HH Agency:     Status of Service:  In process, will continue to follow  If discussed at Long Length of Stay Meetings, dates discussed:    Additional Comments:  Leeroy Cha, RN 02/07/2018, 9:43 AM

## 2018-02-08 ENCOUNTER — Inpatient Hospital Stay (HOSPITAL_COMMUNITY): Payer: Medicare PPO

## 2018-02-08 DIAGNOSIS — A419 Sepsis, unspecified organism: Secondary | ICD-10-CM

## 2018-02-08 DIAGNOSIS — R6520 Severe sepsis without septic shock: Secondary | ICD-10-CM

## 2018-02-08 LAB — GLUCOSE, CAPILLARY
GLUCOSE-CAPILLARY: 130 mg/dL — AB (ref 70–99)
GLUCOSE-CAPILLARY: 131 mg/dL — AB (ref 70–99)
GLUCOSE-CAPILLARY: 139 mg/dL — AB (ref 70–99)
Glucose-Capillary: 137 mg/dL — ABNORMAL HIGH (ref 70–99)
Glucose-Capillary: 124 mg/dL — ABNORMAL HIGH (ref 70–99)
Glucose-Capillary: 139 mg/dL — ABNORMAL HIGH (ref 70–99)
Glucose-Capillary: 169 mg/dL — ABNORMAL HIGH (ref 70–99)

## 2018-02-08 LAB — CBC WITH DIFFERENTIAL/PLATELET
Basophils Absolute: 0 10*3/uL (ref 0.0–0.1)
Basophils Relative: 0 %
Eosinophils Absolute: 0.3 10*3/uL (ref 0.0–0.7)
Eosinophils Relative: 2 %
HEMATOCRIT: 33.9 % — AB (ref 36.0–46.0)
Hemoglobin: 10.7 g/dL — ABNORMAL LOW (ref 12.0–15.0)
LYMPHS ABS: 0.9 10*3/uL (ref 0.7–4.0)
Lymphocytes Relative: 6 %
MCH: 25.7 pg — ABNORMAL LOW (ref 26.0–34.0)
MCHC: 31.6 g/dL (ref 30.0–36.0)
MCV: 81.5 fL (ref 78.0–100.0)
MONO ABS: 1.1 10*3/uL — AB (ref 0.1–1.0)
MONOS PCT: 7 %
NEUTROS ABS: 12.8 10*3/uL — AB (ref 1.7–7.7)
Neutrophils Relative %: 85 %
PLATELETS: 160 10*3/uL (ref 150–400)
RBC: 4.16 MIL/uL (ref 3.87–5.11)
RDW: 23.9 % — AB (ref 11.5–15.5)
WBC: 15.1 10*3/uL — ABNORMAL HIGH (ref 4.0–10.5)

## 2018-02-08 LAB — TROPONIN I
Troponin I: 0.04 ng/mL (ref <0.03)
Troponin I: 0.05 ng/mL (ref <0.03)

## 2018-02-08 LAB — BASIC METABOLIC PANEL
Anion gap: 14 (ref 5–15)
BUN: 31 mg/dL — ABNORMAL HIGH (ref 8–23)
CALCIUM: 7.9 mg/dL — AB (ref 8.9–10.3)
CO2: 37 mmol/L — AB (ref 22–32)
CREATININE: 0.64 mg/dL (ref 0.44–1.00)
Chloride: 86 mmol/L — ABNORMAL LOW (ref 98–111)
GFR calc Af Amer: >60 mL/min (ref >60)
GFR calc non Af Amer: >60 mL/min (ref >60)
GLUCOSE: 142 mg/dL — AB (ref 70–99)
Potassium: 4 mmol/L (ref 3.5–5.1)
Sodium: 137 mmol/L (ref 135–145)

## 2018-02-08 LAB — BODY FLUID CULTURE: CULTURE: NO GROWTH

## 2018-02-08 LAB — BLOOD GAS, ARTERIAL
ACID-BASE EXCESS: 12.9 mmol/L — AB (ref 0.0–2.0)
Bicarbonate: 41.9 mmol/L — ABNORMAL HIGH (ref 20.0–28.0)
Drawn by: 270211
FIO2: 0.3
MECHVT: 330 mL
O2 SAT: 94.1 %
PATIENT TEMPERATURE: 98.6
PCO2 ART: 81.6 mmHg — AB (ref 32.0–48.0)
PEEP/CPAP: 8 cmH2O
PH ART: 7.33 — AB (ref 7.350–7.450)
PO2 ART: 79.9 mmHg — AB (ref 83.0–108.0)
RATE: 20 resp/min

## 2018-02-08 LAB — MAGNESIUM: Magnesium: 1.9 mg/dL (ref 1.7–2.4)

## 2018-02-08 MED ORDER — DEXMEDETOMIDINE HCL IN NACL 200 MCG/50ML IV SOLN
0.4000 ug/kg/h | INTRAVENOUS | Status: DC
  Administered 2018-02-09: 1.5 ug/kg/h via INTRAVENOUS
  Filled 2018-02-08: qty 50

## 2018-02-08 MED ORDER — TRACE MINERALS CR-CU-MN-SE-ZN 10-1000-500-60 MCG/ML IV SOLN
INTRAVENOUS | Status: AC
  Administered 2018-02-08: 18:00:00 via INTRAVENOUS
  Filled 2018-02-08 (×2): qty 1440

## 2018-02-08 MED ORDER — METOPROLOL TARTRATE 5 MG/5ML IV SOLN
2.5000 mg | Freq: Four times a day (QID) | INTRAVENOUS | Status: DC
  Administered 2018-02-08 – 2018-02-11 (×10): 2.5 mg via INTRAVENOUS
  Filled 2018-02-08 (×10): qty 5

## 2018-02-08 MED ORDER — FUROSEMIDE 10 MG/ML IJ SOLN
40.0000 mg | Freq: Two times a day (BID) | INTRAMUSCULAR | Status: DC
  Administered 2018-02-09 – 2018-02-21 (×25): 40 mg via INTRAVENOUS
  Filled 2018-02-08 (×25): qty 4

## 2018-02-08 NOTE — Care Management Note (Signed)
Case Management Note  Patient Details  Name: Jo Parker MRN: [id number] Date of Birth: 08/30/1945  Subjective/Objective:                  Hypertension and ventilator dyssynchrony - Lasix dose held earlier d/t hypotension. Now BP = 240/66 and CXR appears wet.    eICU Interventions  Will order: 1. Restart scheduled Lasix. 2. Increase ceiling on Fentanyl IV infusion to 200 mcg/hour.      Action/Plan: Remains on full vent support  Expected Discharge Date:  01/29/18               Expected Discharge Plan:  Home/Self Care  In-House Referral:     Discharge planning Services  CM Consult  Post Acute Care Choice:    Choice offered to:     DME Arranged:    DME Agency:     HH Arranged:    HH Agency:     Status of Service:  In process, will continue to follow  If discussed at Long Length of Stay Meetings, dates discussed:    Additional Comments:  Leeroy Cha, RN 02/08/2018, 9:49 AM

## 2018-02-08 NOTE — Progress Notes (Signed)
Patient has skin tear around anus and bottom of labia and also skin tear on right butt cheek. Sites cleaned and barrier cream applied. Charge RN, Ruby made aware and second verification done.

## 2018-02-08 NOTE — Progress Notes (Signed)
[place] Progress Note Patient Name: Jo Parker DOB: 1946-05-14 MRN: [id number]   Date of Service  02/08/2018  HPI/Events of Note  Hypertension and ventilator dyssynchrony - Lasix dose held earlier d/t hypotension. Now BP = 240/66 and CXR appears wet.   eICU Interventions  Will order: 1. Restart scheduled Lasix. 2. Increase ceiling on Fentanyl IV infusion to 200 mcg/hour.      Intervention Category Major Interventions: Other:  Sommer,Steven Cornelia Copa 02/08/2018, 5:56 AM

## 2018-02-08 NOTE — Progress Notes (Signed)
Interventional Radiology Progress Note  Pt is 72 yo female with [name] SP exlap, omentectomy, bilateral sapingo-oophorectomy, tumor debulking, anterior abd wall resection 01/27/2018, then exlap and washout 01/31/2018 (concern for perforation, with none found).    She has had recurrent fever, and CT performed 02/08/2018 shows surgical drains in place, with left pelvic fluid/gas-fluid collections.   VIR contact regarding possible drainage.    Agree the etiology of the fluid is uncertain.  These could be either typical post-operative reactive fluid/ascites, or potentially infection.    The location is difficult, with no good window for safe approach.  My impression is that there is risk of vascular injury from transgluteal approach, and risk of bowel injury from anterior approach.  Would defer for now.  I have discussed with the family and Dr. Lamonte Sakai of [place].  Dr. Serita Grit team aware.    Call with questions/concerns.   Signed,  Dulcy Fanny. Earleen Newport, DO

## 2018-02-08 NOTE — Progress Notes (Signed)
[facility] CONSULT NOTE   Pharmacy Consult for TPN Indication: bowel perforation  Patient Measurements: Height: 5\' 3"  (160 cm) Weight: 132 lb 15 oz (60.3 kg) IBW/kg (Calculated) : 52.4 TPN AdjBW (KG): 58.3 Body mass index is 23.55 kg/m.  Insulin Requirements: 15 units resistant SSI (no meds PTA) yesterday  - Scheduled Novolog 4 units q4h (started 8/30 per MD) = 24 units/ 24 hrs  Current Nutrition: NPO   IVF: D5W @ 10 ml/hr - Also on continuous Precedex at 28 ml/hr, Fentanyl at 7.5 ml/hr - Furosemide 60 mg IV q8h (aggressive diuresis) - 9/2 PM dose held due to hypotension, given 522ml LR bolus  Central access: Implanted port 10/14/17, triple lumen PICC 02/01/18 TPN start date: 02/02/2018  ASSESSMENT                                                                                                          HPI: Pt is a 72 year old with ovarian cancer status post bilateral salpingo-oophorectomy and omentectomy with debulking on 8/22. Ex lap on 8/26. Pt remains in ICU critically ill on pressors, vent support.  She has had poor appetite and poor PO intakes since admission and for unknown amount of time PTA and will be at risk for refeeding syndrome.  Significant events:  - 8/27 Start TPN. Hold lipids for first 7 days while pt in ICU  - 8/30 scheduled Novolog insulin added q4h - 9/2 CT scan of abdomen shows undrained left pelvic fluid collection   Today:   Glucose - CBGs mostly controlled with scheduled Novolog and SSI (115-132). No history of DM  Electrolytes - All WNL after supplementation  Renal - SCr low/stable, BUN elevated, Cl low, bicarb remains elevated  I/O: Lasix IV 60 tid (holding some doses d/t low BP); appears to be diuresing well with net I/O -1815ml, down from >10L earlier this admit  LFTs - transaminitis resolved, albumin low  TGs - WNL, 39 (9/2)  Prealbumin - low and decreasing, 3.5 (9/2)  NUTRITIONAL GOALS                                                                                              RD recs (8/27): Kcal:  1686, Protein:  87-99 grams (1.5-1.7 grams/kg), Fluid:  >/= 1.5 L/day  Based on published guidelines, while the patient meets ICU status the initiation of lipids is being delayed for 7 days or until transition out of ICU. Planned start date for lipids is 9/4.  Goal will be to meet 100% of the patient's protein needs and approximately 80% of their caloric needs.  Clinimix 5/20 @ 80 mL/hr to provide 96 grams protein (100% of  goal) and 1689 kcal (100% of goal)  Glucose infusion rate will be 3.8 mg/kg/min (Maximum 5 mg/kg/min)   PLAN  At 1800 today:  Continue Clinimix E 5/20 at 60 ml/hr - will not increase rate since holding diuresis today & possibly beginning trickle TF tonight.    TPN to contain standard multivitamins and trace elements.  Hold 20% fat emulsion while pt is ICU status x 7 days.  Start on 9/4.  Continue resistant SSI q4h. Continue scheduled Novolog 4 units q4h per MD.  TPN lab panels on Mondays & Thursdays.  CMET, Mg, Phostomorrow  F/u daily.   Peggyann Juba, PharmD, BCPS Pager: [phone] 02/08/2018, 7:28 AM

## 2018-02-08 NOTE — Progress Notes (Signed)
Gynecologic Oncology Progress Note  72 year old female diagnosed with Stage IV ovarian cancer who received four cycles of neoadjuvant chemotherapy with carboplatin and taxol with fourth cycle completed on 12/20/17 .  She underwent interval debulking surgery with Dr. Skeet Latch on 01/27/18 consisting of an exploratory laparotomy infra-gastric omentectomy bilateral salpingo-oophorectomy radical optimal tumor debulking, resection of anterior abdominal wall.  Her post-operative course was complicated by altered mental status noted on POD1 with improvement after Narcan administration but with return of symptoms, acute renal injury, tachycardia, dyspnea (CT chest performed 01/30/18-neg for PE), anemia.  Her AKI and altered mental status appeared to improve on 01/30/18 in Jo am but symptoms worsened Jo evening of 01/30/18 with episodes of emesis, tachycardia, hypertension, and confusion. A CT scan on 01/31/18 revealed findings most consistent with ischemic bowel with portal venous gas and bowel pneumatosis and large amount of pneumoperitoneum with concerns for peritonitis.  She was taken back to [facility] OR on 01/31/18 with operative findings of free air and large volume ascites/fluid, but unable to visualize site of bowel perforation, no evidence of ischemia. It was felt that aspiration most likely occurred during intubation.        Subjective: Patient intubated. Husband at Jo bedside.     Objective: Vital signs in last 24 hours: Temp:  [99.9 F (37.7 C)-102.7 F (39.3 C)] 102.7 F (39.3 C) (09/03 0700) Pulse Rate:  [55-121] 110 (09/03 0700) Resp:  [21-35] 22 (09/03 0700) BP: (80-228)/(33-100) 132/49 (09/03 0700) SpO2:  [94 %-100 %] 96 % (09/03 0700) FiO2 (%):  [30 %] 30 % (09/03 0347) Weight:  [132 lb 15 oz (60.3 kg)] 132 lb 15 oz (60.3 kg) (09/03 0600) Last BM Date: 02/07/18  Intake/Output from previous day: 09/02 0701 - 09/03 0700 In: 2975.4 [I.V.:2178.1; NG/GT:90; IV Piggyback:707.3] Out: 4610  [Urine:4250; Emesis/NG output:325; Drains:35]  Physical Examination: General: appears comfortable, does not respond to voice or vigorous stimulus. Resp: coarse breath sounds bilaterally, rhonchi noted Cardio: heart rate at 87, regular rhythm GI: incision: midline incision with gauze dressings, clean, no active drainage noted and abdomen slightly distended, hypoactive bowel sounds Extremities: generalized edema in all extrem JP drain x3 in Jo abdomen charged. Minimal serous drainage noted in all three. Foley with clear, no longer blood-tinged urine   Labs: WBC/Hgb/Hct/Plts:  15.1/10.7/33.9/160 (09/03 5631) BUN/Cr/glu/ALT/AST/amyl/lip:  31/0.64/--/--/--/--/-- (09/03 4970)   Assessment: 72 y.o. s/p Procedure(s): Interval debulking surgery with Dr. Skeet Latch on 01/27/18 consisting of an exploratory laparotomy infra-gastric omentectomy bilateral salpingo-oophorectomy radical optimal tumor debulking, resection of anterior abdominal wall.  Reoperation of EXPLORATORY LAPAROTOMY, ABDOMINAL WASHOUT on 01/31/18: critically ill  Heme: Post-operative anemia. Hgb 10.7 which is stable though downward trending, has not required transfusion since 02/02/18.  Anemia related to acute critical illneess, no evidence of active bleeding.   ID: Fever persistent, Tmax to 102 on 02/06/18. Presumed sepsis initially from gastrointestinal source but unable to ID Jo perforation after exp lap on 01/31/18. Contribution now from pneumonia and undrained left pelvic fluid collection seen on CT scan 02/07/18. WBC mildly elevated. Granix received 02/01/18 per Dr. Alvy Bimler.   Vancomycin stopped on 02/04/18 and then fevers began again. Restarted Vancomycin on 02/06/18. Anidulafungin initiated 02/01/18.  Meropenum IV started 01/31/18.  Blood cultures from 01/31/18 & 02/06/18 with no growth.  Tracheal aspirate from 02/01/18 resulting few gram positive rods. Repeated 02/06/18 showed few yeast. JP drain culture from 02/05/18 no growth to date.  Will  consult IR for possible percutaneous drainage of left lower quadrant collection today.  CV: Tachycardia and hypertension improved. BP sensitive with changes in sedation.  Critical care team are aggressively managing BP through hydralazine and sedation alterations. Likely some of Jo underlying hypotension on sedation is secondary to aggressive diuresis in setting of ARDS and pulm edema. Though still net positive volume status.  Resp: ARDS in setting of aspiration pneumonia and sepsis.   Chest 1 view this am: Stable multifocal pneumonia.   She has continued to make ventilator gains though not yet ready for spontaneous breathing as she has apparent increased WOB with reductions in sedation.  GI:  Tolerating po: No: intubated.  Bowel movement on 02/06/18, suggestive of return of bowel function which is promising. Protonix ordered. GI consultation appreciated.  CT AP on 01/31/18 with findings consistent with GI perforation.  No obvious perforation identified on re-operation on 01/31/18. NG with brown output (150cc in canister). TPN initiated 02/02/18. Will start tube feeds after IR drainage procedure. If she tolerates this, will taper off TPN and remove drains if no evidence of enteric leak on tube feeds.   GU: AKI post-op resolved. Creatinine 0.6 02/08/18. Foley in place.  Plan for continued diuresis today per CC (as tolerated by BP).  FEN: No critical values. Replacement today per CC.   Prophylaxis: SCDs in place. Anticoagulation on hold due to persistent thrombocytopenia from sepsis and possible need for intervention pending CT findings. Will start Lovenox prophylaxis after IR procedure. No active bleeding and H&H stable.  Plan: Continue care per Critical Care. Will consult IR for percutaneous drainage of left lower quadrant collection. Agree with plan to continute vanc in addition to merepenim and antifungal.  Plan to hold on removal of JP drains at this time per given fevers (would consider  pulling them after IR drainage procedure and after we have started GI feeds with no signs of enteric leak/fistula).   Start tube feeds after IR procedure, Can then wean off TPN per pharmacy.  I spoke with her husband and son today regarding our plan and her progress.  I spoke with her RN regarding her status and plan.   Thereasa Solo 02/08/2018, 7:33 AM

## 2018-02-08 NOTE — Progress Notes (Addendum)
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  She is completed 3 cycles of chemotherapy and is now status post bilateral salpingo-oophorectomy and omentectomy with debulking on 8/22. 8/23: Difficult with analgesic control.  This was complicated by patient becoming somnolent and hypotensive after adjustment of narcotics, treated with Saline and adjustment of medications. 8/24: Still somnolent, noted to be confused.  Tranfused 1 unit for hemoglobin of 6.7.  Internal medicine consulted for acute encephalopathy also postoperative acute kidney injury.  8/25: Remains encephalopathic.  Hemoglobin improved posttransfusion.  Progress note commenting on dyspnea. Had episode of vomiting shortly after arriving in [place] unit.  More hypertensive and tachycardic. 8/26 transferred to ICU w/ concern for perforation w/ free air noted.  Imipenem started. Went for exploratory laparotomy with washout.  No significant contamination identified, presumed spontaneously closed microperf. Returned with abdominal drains postoperative chest x-ray showing evidence of aspiration pneumonia; mixed respiratory and metabolic acidosis & in septic shock.  8/27: weaning pressors. Hgb dropped from 8.8 to 7.4; got 2 units of blood,  febrile, added. vancomycin.  Neupogen given for neutropenia.  8/28: Pressors off/shock resolved. NAG acidosis and Lactic acidosis resolved.  FiO2/PEEP down, attempted PSV.  WBC count up.  Started TPN.  8/29: FiO2 and PEEP requirements increased, had wean these down to 40/5 now 60/10.  Chest x-ray with worsening right-sided airspace disease likely reflecting de-recruitment. Delirium remains significant obstacle.  Beta-blocker, and clonidine patch added for hypertension    8/30: Diuresed 4.5 L the day prior with Lasix.  Little more awake.  X-ray improved.  Vancomycin discontinued.  Continuing diuresis, RASS goal changed to -1, dropping PEEP to 8. 9/01  Vanco added back with fevers, clonidine patch 9/02 Sedation difficult, diuresing, remains on abx. CT abd pelvis given fevers.    Subjective/interval events  RN reports hypotension, tmax to 102.4.  I/O- net neg 1.8L in last 24 hours, remains 1.9L positive for admit. Concerns for BP fluctuations low/high & bradycardia to 30's.  Pending IR placement of additional abdominal drain.    Resolved hospital Problems  Septic shock 8/28 AKI  Assessment & Plan:   Sepsis, fever with neutropenia: peritonitis (microperforation), complicated by aspiration pneumonia.  -ongoing fevers, 9/2 CT Abd concerning for loculated fluid collection Plan Continue Eraxix, Meropenem, Vancomycin  Pending IR placement of left abdominal drain for loculated fluid  Follow cultures to maturity  Assess fluid collection culture post IR drain placement   Status post exploratory laparotomy for free air on 8/26 ; following bilateral salpingo-oophorectomy, omentectomy, and debulking surgery on 8/22 Plan Per surgery, plan is to begin TF post IR drain placement.  Would consider trickle feeding for first 24 hours  Continue TPN for now  Post operative recommendations per GYN  Acute hypoxic respiratory failure in the setting of aspiration pneumonia/ARDS Plan Assess ABG now  PRVC 8 cc/kg  Wean PEEP / FiO2 for sats >90%, currently PEEP 8, FiO2 30% No SBT at this time  Reschedule diuresis to start in am 9/4 pending BP review  Follow CXR  Sinus tachycardia, hypertension, and grade 1 diastolic dysfunction with minimal troponin I bump.   Bradycardia - periods of brady to the 30's -Echo showed hyperdynamic ejection fraction with no wall motion abnormality Plan Clonidine patch has been off due to hypotension,  discontinue from [facility]  Reduce lopressor  to 2.5mg  with hold parameters  Diuresis as above  Reduce precedex as able  Concern for inaccurate BP readings ?? With isolated significant HTN  Acute metabolic encephalopathy.   -Suspect this is multifactorial and secondary to sepsis as well as residual narcotics and metabolic acidosis, this remains a significant barrier. Plan RASS Goal: 0 to -1 Continue fentanyl gtt  Reduce precedex   At risk for recurrent AKI.  Plan Trend BMP / urinary output Replace electrolytes as indicated Avoid nephrotoxic agents, ensure adequate renal perfusion  Mild anion gap metabolic acidosis Plan Follow labs Assess ABG now   At risk for fluid and electrolyte imbalance: Anasarca, hypomagnesemia, hypokalemia, mild hypernatremia  Plan Minimize fluids as able  Follow electrolytes   Severe Protein Calorie Malnutrition, prealbumin 3.5 Plan Continue TPN per pharmacy  Begin enteral feeding this evening at trickle   Postoperative anemia.  Does have coffee ground gastric tube output, this seems to have improved Hemoglobin stable Plan Monitor for bleeding  Follow Hgb, transfuse per ICU guidelines Discontinue ASA   Leukocytosis.  Initially was neutropenic however did receive Granix on 8/28 per oncology; stable Plan Follow CBC   Thrombocytopenia; stable Plan Continue SCD's  Follow CBC  Consider restarting lovenox post drain placement if ok with GYN Discontinue ASA, flat troponin   Hyperglycemia Plan SSI   Stage IV ovarian cancer, now status post bilateral salpingo-oophorectomy omentectomy and debulking surgery on 8/22 Plan Per GYN/ONC  Best practice/Goals of care/disposition.   DVT prophylaxis: LMWH-->SCD (8/26); continuing SCDs as of 8/29 given thrombocytopenia GI prophylaxis: Changed to PPI on 8/27 Diet: NPO, starting TPN on 8/28 Mobility: BR Code Status: Full Code Family Communication:  Sons x2 updated at bedside am 9/3.    Disposition / Summary of Today's Plan 02/08/18      Consultants:  Internal medicine 8/25 Critical care 8/26 General surgery 8/26 GI medicine 8/27 > did not feel she had overt GIB.  Rec's prophylaxis with PPI.  Signed off.  Procedures: 8/22 bilateral salpingo-nephrectomy omentectomy and debulking surgery  8/26 Intubation 8/26 Exploratory laparotomy, washout, placement of right upper quadrant x2 JP drain and left lower quadrant JP drain  Significant Diagnostic Tests: CT abdomen 8/26: Hemic bowel with portal venous gas and bowel pneumatosis with large amount of pneumoperitoneum.  Unclear as to site of perforation.  Multiple pockets of air adjacent to the sigmoid colon.  Enhancement of the peritoneum concerning for peritonitis.  There was aortic arterial sclerosis.  Echocardiogram 8/27: Particular ejection fraction 65 to 70%.  She has moderate LVH, and hyperdynamic LV function.  There is grade 1 diastolic dysfunction.  Trivial MR.  Normal left atrial size.  The IV C was not visualized  CT abdomen 9/2 >> drainage catheters within the pelvis, RUQ, LUQ.  Significant interval decrease in size of previously described intra-abdominal fluid, no residual free intraperitoneal air. There is a persistent loculated fluid & gas collection along the left pelvic sidewall measuring up to 10 cm.  Moderate bilateral pleural effusions, underlying opacities favored to represent atelectasis.  Marked wall thickening of the sigmoid colon.  Peripheral low attenuation within the spleen concerning for splenic infarcts.  Wall thickening of the urinary bladder.   Micro Data: Blood Culture x2 8/26 >> Sputum 8/27: Abundant yeast, few Candida albicans Blood Cx 9/1 >>  Respiratory 9/1 >> Urine 9/1 >> negative Peritoneal fluid 8/31 >>   Antimicrobials:  Meropenem 8/26 >> Vancomycin 8/27 >> 8/30; 9/1 >>  Eraxis 8/27 >>  Objective    Examination: General: chronically ill appearing female in NAD on vent  HEENT: MM pink/moist, ETT Neuro: sedate on precedex,  fentanyl CV: s1s2 rrr, ?SEM PULM: even/non-labored, clear on left, diminished on right  GI: midline abdominal dressing c/d/i, JP drain x2, decreased bowel sounds  Extremities: warm/dry, generalized 1+ edema  Skin: no rashes or lesions   Blood pressure (!) 84/29, pulse 82, temperature (!) 100.6 F (38.1 C), resp. rate (!) 22, height 5\' 3"  (1.6 m), weight 60.3 kg, SpO2 98 %.    Vent Mode: PRVC FiO2 (%):  [30 %] 30 % Set Rate:  [20 bmp] 20 bmp Vt Set:  [330 mL] 330 mL PEEP:  [5 cmH20-8 cmH20] 8 cmH20 Plateau Pressure:  [13 cmH20-18 cmH20] 17 cmH20   Intake/Output Summary (Last 24 hours) at 02/08/2018 0920 Last data filed at 02/08/2018 0900 Gross per 24 hour  Intake 3279.19 ml  Output 3615 ml  Net -335.81 ml   Filed Weights   02/06/18 0541 02/07/18 0406 02/08/18 0600  Weight: 65.3 kg 63.2 kg 60.3 kg     Labs   CBC: Recent Labs  Lab 02/04/18 0623 02/05/18 0518 02/06/18 1037 02/07/18 0327 02/08/18 0514  WBC 23.3* 14.3* 17.0* 14.8* 15.1*  NEUTROABS 20.7* 11.6* 15.0* 12.8* 12.8*  HGB 11.1* 10.9* 9.8* 9.7* 10.7*  HCT 33.9* 34.2* 31.4* 30.8* 33.9*  MCV 79.0 82.0 82.6 83.0 81.5  PLT 52* 65* 81* 92* 235   Basic Metabolic Panel: Recent Labs  Lab 02/04/18 0407 02/04/18 0612  02/05/18 0518  02/05/18 1615 02/06/18 1037 02/07/18 0327 02/07/18 1740 02/08/18 0514  NA SPECIMEN CONTAMINATED, UNABLE TO PERFORM TEST(S). 146*   < >  --    < > 143 146* 145 138 137  K SPECIMEN CONTAMINATED, UNABLE TO PERFORM TEST(S). 2.9*   < >  --    < > 3.3* 3.2* 3.9 4.0 4.0  CL SPECIMEN CONTAMINATED, UNABLE TO PERFORM TEST(S). 101   < >  --    < > 89* 102 99 86* 86*  CO2 SPECIMEN CONTAMINATED, UNABLE TO PERFORM TEST(S). 33*   < >  --    < > 39* 35* 38* 39* 37*  GLUCOSE SPECIMEN CONTAMINATED, UNABLE TO PERFORM TEST(S). 203*   < >  --    < > 144* 247* 90 149* 142*  BUN SPECIMEN CONTAMINATED, UNABLE TO PERFORM TEST(S). 21   < >  --    < > 26* 24* 27* 29* 31*  CREATININE SPECIMEN CONTAMINATED, UNABLE  TO PERFORM TEST(S). 0.61   < >  --    < > 0.61 0.45 0.50 0.61 0.64  CALCIUM SPECIMEN CONTAMINATED, UNABLE TO PERFORM TEST(S). 7.5*   < >  --    < > 7.7* 6.4* 7.0* 7.7* 7.9*  MG SPECIMEN CONTAMINATED, UNABLE TO PERFORM TEST(S). 1.2*  --  1.6*  --  2.1 1.5* 1.7 2.1 1.9  PHOS SPECIMEN CONTAMINATED, UNABLE TO PERFORM TEST(S). 4.2  --  3.9  --   --  2.2* 4.0  --   --    < > = values in this interval not displayed.   GFR: Estimated Creatinine Clearance: 52.6 mL/min (by C-G formula based on SCr of 0.64 mg/dL). Recent Labs  Lab 02/01/18 2027  02/05/18 0518 02/06/18 1010 02/06/18 1037 02/07/18 0327 02/07/18 0342 02/08/18 0514  WBC  --    < > 14.3*  --  17.0* 14.8*  --  15.1*  LATICACIDVEN 2.4*  --   --  1.3  --   --  0.8  --    < > = values in this interval not displayed.   Liver Function Tests: Recent Labs  Lab 02/02/18 0455 02/03/18 0410 02/07/18 0327  AST 32 31 39  ALT 18 19 30   ALKPHOS 102 143* 100  BILITOT 0.9 1.1 0.6  PROT 4.2* 4.6* 4.9*  ALBUMIN 1.8* 1.7* 1.3*   No results for input(s): LIPASE, AMYLASE in the last 168 hours. No results for input(s): AMMONIA in the last 168 hours. ABG    Component Value Date/Time   PHART 7.454 (H) 02/03/2018 0345   PCO2ART 44.9 02/03/2018 0345   PO2ART 52.6 (L) 02/03/2018 0345   HCO3 31.0 (H) 02/03/2018 0345   TCO2 17 (L) 01/31/2018 1728   ACIDBASEDEF 0.4 02/01/2018 0432   O2SAT 86.9 02/03/2018 0345    Coagulation Profile: No results for input(s): INR, PROTIME in the last 168 hours.   Cardiac Enzymes: Recent Labs  Lab 02/01/18 1605 02/07/18 1740 02/07/18 2314 02/08/18 0514  TROPONINI 0.04* 0.05* 0.04* 0.05*   HbA1C: No results found for: HGBA1C   CBG: Recent Labs  Lab 02/07/18 2005 02/08/18 0010 02/08/18 0014 02/08/18 0424 02/08/18 0810  GLUCAP 132* >600* 131* 130* 74*    Noe Gens, NP-C [facility] Pgr: [phone] or if no answer [phone] 02/08/2018, 9:20 AM

## 2018-02-09 DIAGNOSIS — Z8543 Personal history of malignant neoplasm of ovary: Secondary | ICD-10-CM

## 2018-02-09 DIAGNOSIS — R011 Cardiac murmur, unspecified: Secondary | ICD-10-CM

## 2018-02-09 DIAGNOSIS — Z9911 Dependence on respirator [ventilator] status: Secondary | ICD-10-CM

## 2018-02-09 DIAGNOSIS — Z87891 Personal history of nicotine dependence: Secondary | ICD-10-CM

## 2018-02-09 DIAGNOSIS — Z9221 Personal history of antineoplastic chemotherapy: Secondary | ICD-10-CM

## 2018-02-09 DIAGNOSIS — K659 Peritonitis, unspecified: Secondary | ICD-10-CM

## 2018-02-09 DIAGNOSIS — J189 Pneumonia, unspecified organism: Secondary | ICD-10-CM

## 2018-02-09 DIAGNOSIS — Z885 Allergy status to narcotic agent status: Secondary | ICD-10-CM

## 2018-02-09 DIAGNOSIS — Z90722 Acquired absence of ovaries, bilateral: Secondary | ICD-10-CM

## 2018-02-09 DIAGNOSIS — E46 Unspecified protein-calorie malnutrition: Secondary | ICD-10-CM

## 2018-02-09 DIAGNOSIS — Z8049 Family history of malignant neoplasm of other genital organs: Secondary | ICD-10-CM

## 2018-02-09 DIAGNOSIS — Z95828 Presence of other vascular implants and grafts: Secondary | ICD-10-CM

## 2018-02-09 DIAGNOSIS — Z978 Presence of other specified devices: Secondary | ICD-10-CM

## 2018-02-09 DIAGNOSIS — Z803 Family history of malignant neoplasm of breast: Secondary | ICD-10-CM

## 2018-02-09 LAB — BASIC METABOLIC PANEL
ANION GAP: 13 (ref 5–15)
BUN: 32 mg/dL — ABNORMAL HIGH (ref 8–23)
CHLORIDE: 90 mmol/L — AB (ref 98–111)
CO2: 37 mmol/L — AB (ref 22–32)
Calcium: 7.9 mg/dL — ABNORMAL LOW (ref 8.9–10.3)
Creatinine, Ser: 0.6 mg/dL (ref 0.44–1.00)
GFR calc Af Amer: >60 mL/min (ref >60)
GFR calc non Af Amer: >60 mL/min (ref >60)
GLUCOSE: 182 mg/dL — AB (ref 70–99)
POTASSIUM: 4.4 mmol/L (ref 3.5–5.1)
Sodium: 140 mmol/L (ref 135–145)

## 2018-02-09 LAB — CBC WITH DIFFERENTIAL/PLATELET
Basophils Absolute: 0 10*3/uL (ref 0.0–0.1)
Basophils Relative: 0 %
EOS PCT: 1 %
Eosinophils Absolute: 0.1 10*3/uL (ref 0.0–0.7)
HCT: 34.4 % — ABNORMAL LOW (ref 36.0–46.0)
Hemoglobin: 10.6 g/dL — ABNORMAL LOW (ref 12.0–15.0)
Lymphocytes Relative: 5 %
Lymphs Abs: 0.7 10*3/uL (ref 0.7–4.0)
MCH: 25.5 pg — ABNORMAL LOW (ref 26.0–34.0)
MCHC: 30.8 g/dL (ref 30.0–36.0)
MCV: 82.9 fL (ref 78.0–100.0)
Monocytes Absolute: 1 10*3/uL (ref 0.1–1.0)
Monocytes Relative: 7 %
NEUTROS PCT: 87 %
Neutro Abs: 12.4 10*3/uL — ABNORMAL HIGH (ref 1.7–7.7)
Platelets: 164 10*3/uL (ref 150–400)
RBC: 4.15 MIL/uL (ref 3.87–5.11)
RDW: 22.8 % — ABNORMAL HIGH (ref 11.5–15.5)
WBC: 14.2 10*3/uL — ABNORMAL HIGH (ref 4.0–10.5)

## 2018-02-09 LAB — GLUCOSE, CAPILLARY
GLUCOSE-CAPILLARY: 177 mg/dL — AB (ref 70–99)
GLUCOSE-CAPILLARY: 143 mg/dL — AB (ref 70–99)
Glucose-Capillary: 145 mg/dL — ABNORMAL HIGH (ref 70–99)
Glucose-Capillary: 150 mg/dL — ABNORMAL HIGH (ref 70–99)
Glucose-Capillary: >600 mg/dL (ref 70–99)
Glucose-Capillary: 174 mg/dL — ABNORMAL HIGH (ref 70–99)
Glucose-Capillary: 197 mg/dL — ABNORMAL HIGH (ref 70–99)

## 2018-02-09 LAB — CULTURE, RESPIRATORY

## 2018-02-09 LAB — VANCOMYCIN, PEAK: VANCOMYCIN PK: 36 ug/mL (ref 30–40)

## 2018-02-09 LAB — CULTURE, RESPIRATORY W GRAM STAIN

## 2018-02-09 MED ORDER — FREE WATER
100.0000 mL | Freq: Three times a day (TID) | Status: DC
  Administered 2018-02-09 – 2018-02-22 (×32): 100 mL

## 2018-02-09 MED ORDER — ENOXAPARIN SODIUM 40 MG/0.4ML ~~LOC~~ SOLN
40.0000 mg | SUBCUTANEOUS | Status: DC
  Administered 2018-02-09 – 2018-02-16 (×8): 40 mg via SUBCUTANEOUS
  Filled 2018-02-09 (×8): qty 0.4

## 2018-02-09 MED ORDER — LABETALOL HCL 5 MG/ML IV SOLN: 20.0000 mg | INTRAVENOUS | Status: DC | PRN

## 2018-02-09 MED ORDER — FENTANYL CITRATE (PF) 100 MCG/2ML IJ SOLN
50.0000 ug | INTRAMUSCULAR | Status: DC | PRN
  Administered 2018-02-10 – 2018-02-28 (×67): 50 ug via INTRAVENOUS
  Filled 2018-02-09 (×70): qty 2

## 2018-02-09 MED ORDER — VITAL AF 1.2 CAL PO LIQD
1000.0000 mL | ORAL | Status: DC
  Administered 2018-02-09: 1000 mL

## 2018-02-09 MED ORDER — FAT EMULSION PLANT BASED 20 % IV EMUL
240.0000 mL | INTRAVENOUS | Status: AC
  Administered 2018-02-09: 240 mL via INTRAVENOUS
  Filled 2018-02-09: qty 250

## 2018-02-09 MED ORDER — DEXMEDETOMIDINE HCL IN NACL 400 MCG/100ML IV SOLN
0.4000 ug/kg/h | INTRAVENOUS | Status: DC
  Administered 2018-02-09 – 2018-02-10 (×2): 0.7 ug/kg/h via INTRAVENOUS
  Administered 2018-02-10: 0.4 ug/kg/h via INTRAVENOUS
  Administered 2018-02-10: 0.6 ug/kg/h via INTRAVENOUS
  Filled 2018-02-09 (×4): qty 100

## 2018-02-09 MED ORDER — QUETIAPINE FUMARATE 50 MG PO TABS
25.0000 mg | ORAL_TABLET | Freq: Two times a day (BID) | ORAL | Status: DC
  Administered 2018-02-09 – 2018-02-10 (×4): 25 mg
  Filled 2018-02-09 (×4): qty 1

## 2018-02-09 MED ORDER — TRACE MINERALS CR-CU-MN-SE-ZN 10-1000-500-60 MCG/ML IV SOLN
INTRAVENOUS | Status: AC
  Administered 2018-02-09: 18:00:00 via INTRAVENOUS
  Filled 2018-02-09 (×2): qty 1440

## 2018-02-09 NOTE — Consult Note (Addendum)
[facility] for Infectious Disease    Date of Admission:  01/27/2018           Day 10 meropenem        Day 9 vancomycin        Day 9 anidulafungin       Reason for Consult: Persistent fever    Referring Provider: Juluis Mire, NP (Ms. Mroczka's niece)  Assessment: Jo Parker remains critically ill with sepsis that is probably due to postoperative peritonitis and pneumonia.  She is malnourished and deconditioned and her overall prognosis seems quite poor. She is currently being treated broadly for known and suspected pathogens.  I have no other diagnostic or treatment recommendations to make at this time.  I will follow with you.  I did speak with Jo Parker husband, son (by phone) and sister today.  Plan: 1. Continue current antimicrobial therapy  Active Problems:   Severe sepsis ([name])   Peritonitis ([place])   HCAP (healthcare-associated pneumonia)   Primary peritoneal carcinomatosis ([place])   Ovarian cancer ([employer])   Status post bilateral salpingo-oophorectomy   Severe protein-calorie malnutrition ([name])   COPD (chronic obstructive pulmonary disease) ([name])   HLD (hyperlipidemia)   Absolute anemia   White coat syndrome with hypertension   AKI (acute kidney injury) ([place])   Acute metabolic encephalopathy   Acute respiratory distress   Low urine output   Somnolence   At risk for fluid volume overload   Free intraperitoneal air   Acute respiratory failure ([name])   Pressure injury of skin   Scheduled Meds: . chlorhexidine gluconate (MEDLINE KIT)  15 mL Mouth Rinse BID  . enoxaparin (LOVENOX) injection  40 mg Subcutaneous Q24H  . free water  100 mL Per Tube Q8H  . furosemide  40 mg Intravenous Q12H  . insulin aspart  0-20 Units Subcutaneous Q4H  . insulin aspart  4 Units Subcutaneous Q4H  . ipratropium-albuterol  3 mL Nebulization TID  . mouth rinse  15 mL Mouth Rinse 10 times per day  . metoprolol tartrate  2.5 mg Intravenous Q6H  . pantoprazole (PROTONIX)  IV  40 mg Intravenous Q12H  . QUEtiapine  25 mg Per Tube BID  . sodium chloride flush  10-40 mL Intracatheter Q12H   Continuous Infusions: . Marland KitchenTPN (CLINIMIX-E) Adult 60 mL/hr at 02/08/18 1749  . anidulafungin Stopped (02/09/18 1141)  . dexmedetomidine 0.7 mcg/kg/hr (02/09/18 1322)  . dextrose 10 mL/hr at 02/09/18 0900  . Marland KitchenTPN (CLINIMIX-E) Adult     And  . Fat emulsion    . feeding supplement (VITAL AF 1.2 CAL) 1,000 mL (02/09/18 1231)  . meropenem (MERREM) IV Stopped (02/09/18 1035)  . vancomycin Stopped (02/09/18 1334)   PRN Meds:.acetaminophen (TYLENOL) oral liquid 160 mg/5 mL, fentaNYL (SUBLIMAZE) injection, hydrALAZINE, iohexol, labetalol, midazolam, sodium chloride flush  HPI: Jo Parker is a 72 y.o. female who had stage IV ovarian cancer diagnosed in May.  She underwent adjuvant chemotherapy with carboplatin and Taxol.  She was admitted on 01/26/2018 for bilateral salpingo-oophorectomy and tumor debulking of her extensive peritoneal carcinomatosis.  Her postoperative course was complicated by neutropenia.  She began having fever on 01/31/2018.  She was started on broad empiric antibacterial and antifungal therapy.  CT scan showed free air and bowel pneumatosis.  She underwent repeat exploratory laparotomy.  3 drains were placed but no cultures were obtained.  There was also concern for aspiration pneumonia.  Her neutropenia resolved with G-CSF but she has  had more persistent high fevers since 02/05/2018.  Fluid from the peritoneal drain was cultured on 02/05/2018.  No organisms were seen on Gram stain and cultures were negative.  Blood cultures have been negative.  Sputum cultures have grown only Candida species.  Her urine culture is negative.  Repeat CT scan was done on 02/07/2018 and showed a new 10 cm fluid collection along the left pelvis.  Interventional radiology feels that the fluid collection cannot be safely aspirated because of the surrounding structures.  Her chest x-ray shows  persistent right upper lobe infiltrates.   Review of Systems: Review of Systems  Unable to perform ROS: Intubated    Past Medical History:  Diagnosis Date  . Anxiety and depression   . Aortic aneurysm ([place])    see ov note 03/2017   . Arthritis   . Carcinomatosis ([place]) 10/2017  . COPD (chronic obstructive pulmonary disease) ([place]) 10/23/2017  . Depression   . Family history of breast cancer   . Family history of prostate cancer   . Family history of uterine cancer   . History of diverticulitis   . HLD (hyperlipidemia) 10/23/2017  . Hypercholesterolemia   . Hypertension   . Mitral valve prolapse   . Sleep apnea    cpap machine mask and tubing in [place] per patient.  Patient does not have mask or tubing to bring on DOS of 01/27/2018.   Marland Kitchen Uterine cancer ([place])    had partial hysterectomy at 20    Social History   Tobacco Use  . Smoking status: Former Smoker    Packs/day: 1.00    Years: 55.00    Pack years: 55.00    Last attempt to quit: 10/06/2017    Years since quitting: 0.3  . Smokeless tobacco: Never Used  Substance Use Topics  . Alcohol use: Never    Frequency: Never  . Drug use: Never    Family History  Problem Relation Age of Onset  . Endometrial cancer Sister 42       endometrial ca  . Endometrial cancer Paternal Aunt 80  . Breast cancer Paternal Aunt   . Stomach cancer Maternal Grandfather        d. 90s  . Prostate cancer Paternal Uncle   . Breast cancer Cousin        pat first cousin  . Breast cancer Cousin        pat first cousin's daughter  . Lung cancer Father        lung ca  . Heart disease Mother   . Stroke Paternal Grandmother   . Endometrial cancer Cousin        paternal first cousin's daughter  . Breast cancer Other        niece dx less than 88s  . Breast cancer Other        niece, dx under 7   Allergies  Allergen Reactions  . Oxycodone     confusion    OBJECTIVE: Blood pressure (!) 124/45, pulse 74, temperature 99.9 F (37.7 C),  resp. rate 18, height _0  (1.6 m), weight 60.3 kg, SpO2 100 %.  Physical Exam  Constitutional:  She is cachectic.  She is sedated and intubated.  According to her nurse her blood pressure is quite labile.  HENT:  She has an oropharyngeal tube draining green bilious fluid.  Cardiovascular: Regular rhythm.  Murmur heard. She is tachycardic with a 2/6 systolic murmur.  Pulmonary/Chest:  Diffuse rhonchi  Abdominal: Soft.  Clean  dry dressing on her abdominal incision.  She is not having any diarrhea.  She had 20 cc of drain output yesterday.  Genitourinary:  Genitourinary Comments: She is having some blood clots in her urine.  Her nurse reports that her perineum is somewhat raw and inflamed.  Musculoskeletal: She exhibits no edema.  Skin: No rash noted.  She has a right anterior chest Port-A-Cath in the left arm PICC.    Lab Results Lab Results  Component Value Date   WBC 14.2 (H) 02/09/2018   HGB 10.6 (L) 02/09/2018   HCT 34.4 (L) 02/09/2018   MCV 82.9 02/09/2018   PLT 164 02/09/2018    Lab Results  Component Value Date   CREATININE 0.60 02/09/2018   BUN 32 (H) 02/09/2018   NA 140 02/09/2018   K 4.4 02/09/2018   CL 90 (L) 02/09/2018   CO2 37 (H) 02/09/2018    Lab Results  Component Value Date   ALT 30 02/07/2018   AST 39 02/07/2018   ALKPHOS 100 02/07/2018   BILITOT 0.6 02/07/2018     Microbiology: Recent Results (from the past 240 hour(s))  Culture, blood (Routine X 2) w Reflex to ID Panel     Status: None   Collection Time: 01/31/18 10:40 AM  Result Value Ref Range Status   Specimen Description   Final    BLOOD LEFT ARM Performed at [facility], [place] [address], [place], [place] [address]    Special Requests   Final    BOTTLES DRAWN AEROBIC ONLY Blood Culture adequate volume Performed at [facility] [address] [place], [place] [address]    Culture   Final    NO GROWTH 5 DAYS Performed at [facility], [place] [address] [place], [place] [address]    Report Status 02/05/2018 FINAL  Final  Culture, blood (Routine X 2) w Reflex to ID Panel     Status: None   Collection Time: 01/31/18 10:40 AM  Result Value Ref Range Status   Specimen Description   Final    BLOOD LEFT HAND Performed at [facility] [address], [place], [place] [address]    Special Requests   Final    BOTTLES DRAWN AEROBIC ONLY Blood Culture adequate volume Performed at [facility] [address], [place], [place] [address]    Culture   Final    NO GROWTH 5 DAYS Performed at [facility], [place] [address] [place], [place] [address]    Report Status 02/05/2018 FINAL  Final  Culture, respiratory (non-expectorated)     Status: None   Collection Time: 02/01/18  8:11 AM  Result Value Ref Range Status   Specimen Description   Final    TRACHEAL ASPIRATE Performed at [facility] [address], [place], [place] [address]    Special Requests   Final    NONE Performed at [facility], [place] [address] [place], [place] [address]    Gram Stain   Final    NO SQUAMOUS EPITHELIAL CELLS PRESENT ABUNDANT WBC PRESENT, PREDOMINANTLY PMN ABUNDANT YEAST FEW GRAM POSITIVE RODS Performed at [facility], [place] [address], [place], [place] [address]    Culture FEW CANDIDA ALBICANS  Final   Report Status 02/03/2018 FINAL  Final  Body fluid culture     Status: None   Collection Time: 02/05/18 11:54 AM  Result Value Ref Range Status   Specimen  Description   Final    PERITONEAL INTRAPERITONEAL DRAIN 3 Performed at [facility], [place] [address] [place], [place] [address]    Special Requests   Final    NONE Performed at [facility], [place] [address] [place], [place] [address]    Gram Stain   Final    RARE WBC PRESENT, PREDOMINANTLY PMN NO ORGANISMS SEEN    Culture   Final    NO GROWTH 3  DAYS Performed at [facility] [address] [place], [place] [address]    Report Status 02/08/2018 FINAL  Final  Culture, blood (routine x 2)     Status: None (Preliminary result)   Collection Time: 02/06/18  2:42 AM  Result Value Ref Range Status   Specimen Description   Final    BLOOD BLOOD RIGHT HAND Performed at [facility] [address], [place], [place] [address]    Special Requests   Final    BOTTLES DRAWN AEROBIC ONLY Blood Culture results may not be optimal due to an inadequate volume of blood received in culture bottles Performed at [facility] [address] [place], [place] [address]    Culture   Final    NO GROWTH 3 DAYS Performed at [facility], [place] [address] [place], [place] [address]    Report Status PENDING  Incomplete  Urine Culture     Status: None   Collection Time: 02/06/18 10:40 AM  Result Value Ref Range Status   Specimen Description   Final    urine Performed at [facility], [place] [address] [place], [place] [address]    Special Requests   Final    NONE Performed at [facility], [place] [address] [place], [place] [address]    Culture   Final    NO GROWTH Performed at [facility], [place] [address] [place], [place] [address]    Report Status 02/07/2018 FINAL  Final  Culture, respiratory (non-expectorated)     Status: None   Collection Time: 02/06/18 11:44 AM  Result Value Ref Range Status   Specimen Description   Final    TRACHEAL ASPIRATE TRACHEAL ASPIRATE Performed at [facility] [address], [place], [place] [address]    Special Requests   Final    NONE Performed at [facility], [place] [address], [place], [place] [address]    Gram Stain   Final    FEW WBC PRESENT, PREDOMINANTLY PMN RARE YEAST Performed at [facility], [place] [address] [place], [place] [address]    Culture FEW CANDIDA  ALBICANS FEW CANDIDA GLABRATA   Final   Report Status 02/09/2018 FINAL  Final    Michel Bickers, MD [facility] [phone] pager   [phone] cell 02/09/2018, 4:31 PM

## 2018-02-09 NOTE — Progress Notes (Signed)
[place] NOTE   Pharmacy Consult for TPN Indication: bowel perforation  Patient Measurements: Height: 5\' 3"  (160 cm) Weight: 132 lb 15 oz (60.3 kg) IBW/kg (Calculated) : 52.4 TPN AdjBW (KG): 58.3 Body mass index is 23.55 kg/m.  Insulin Requirements: 22 units resistant SSI (no meds PTA) yesterday  - Scheduled Novolog 4 units q4h (started 8/30 per MD) = 24 units/ 24 hrs  Current Nutrition: NPO   IVF: D5W @ 10 ml/hr - Also on continuous Precedex at 18.7 ml/hr, Fentanyl at 9.2 ml/hr - Furosemide decreased 40 mg IV q12h due to hypotension  Central access: Implanted port 10/14/17, triple lumen PICC 02/01/18 TPN start date: 02/02/2018  ASSESSMENT                                                                                                          HPI: Pt is a 72 year old with ovarian cancer status post bilateral salpingo-oophorectomy and omentectomy with debulking on 8/22. Ex lap on 8/26. Pt remains in ICU critically ill on pressors, vent support.  She has had poor appetite and poor PO intakes since admission and for unknown amount of time PTA and will be at risk for refeeding syndrome.  Significant events:  - 8/27 Start TPN. Hold lipids for first 7 days while pt in ICU  - 8/30 scheduled Novolog insulin added q4h - 9/2 CT scan of abdomen shows undrained left pelvic fluid collection, IR consulted - defer drainage for now   Today:   Glucose - CBGs mostly controlled with scheduled Novolog and SSI (124-169). No history of DM  Electrolytes - All WNL  Renal - SCr low/stable, BUN elevated, Cl low, bicarb remains elevated  I/O: Lasix 40mg  IV q12h; UOP 1.44L/24hr, net I/O +65mlml  LFTs - transaminitis resolved, albumin low  TGs - WNL, 39 (9/2)  Prealbumin - low and decreasing, 3.5 (9/2)  NUTRITIONAL GOALS                                                                                             RD recs (8/27): Kcal:  1686, Protein:  87-99  grams (1.5-1.7 grams/kg), Fluid:  >/= 1.5 L/day  Based on published guidelines, while the patient meets ICU status the initiation of lipids is being delayed for 7 days or until transition out of ICU. Planned start date for lipids is 9/4.  Goal will be to meet 100% of the patient's protein needs and approximately 80% of their caloric needs.  Clinimix 5/20 @ 80 mL/hr to provide 96 grams protein (100% of goal) and 1689 kcal (100% of goal)  Glucose infusion rate will be 3.8 mg/kg/min (Maximum 5 mg/kg/min)   PLAN  At 1800 today:  Continue Clinimix E 5/20 at 60 ml/hr - will not increase rate since possibly starting TF today.    TPN to contain standard multivitamins and trace elements.  Begin 20% fat emulsion 66ml/hr x 12hrs (held while pt ICU status x 7 days)  Continue resistant SSI q4h. Continue scheduled Novolog 4 units q4h per MD.  TPN lab panels on Mondays & Thursdays.  F/u daily.   Peggyann Juba, PharmD, BCPS Pager: [phone] 02/09/2018, 7:10 AM

## 2018-02-09 NOTE — Care Management (Signed)
  Discharge Readiness Milestones   MCG Used: respiratory failure due to vent Optimal GLOS: not know Hospital Day: 34 md note from chart: 72 yo female with [name] SP exlap, omentectomy, bilateral sapingo-oophorectomy, tumor debulking, anterior abd wall resection 01/27/2018, then exlap and washout 01/31/2018 (concern for perforation, with none found).    She has had recurrent fever, and CT performed 02/08/2018 shows surgical drains in place, with left pelvic fluid/gas-fluid collections.   VIR contact regarding possible drainage.    Agree the etiology of the fluid is uncertain.  These could be either typical post-operative reactive fluid/ascites, or potentially infection.    The location is difficult, with no good window for safe approach.  My impression is that there is risk of vascular injury from transgluteal approach, and risk of bowel injury from anterior approach.  Would defer for now.   Discharge Readiness Milestones  Identified Barriers: Date/Name case discussed with attending MD (if applicable):  Date/name case discussed with supervisor (if applicable):  Date/name case referred to physician advisor (if applicable)   Concurrent Review -  Evaluation and Treatment Return to top of Respiratory Failure GRG - GRG  Common treatments and tests include(2)(3)(4)(5)(6)(7)(8)(13)(14): ? Mechanical ventilation(11)(12) ? Bronchodilators, steroids, and chest physiotherapy(25) ? Antibiotics ? Tracheal or bronchial suctioning ? ABG, chest x-ray, oximetry, and chest CT scan ? Laboratory tests, including chemistries, toxicology screen, drug levels, and serologic testing as indicated(26) ? Repeated measures of weaning parameters and respiratory muscle strength(26)(27)(28)(29) ? Weaning trials in ICU or intermediate care unit(27)(28)(29)(30)  Commonly scheduled interventions include(2)(3)(6)(7)(8)(9)(14): ? Pulmonary consultation ? Surgical consultation if thoracoscopic  lung biopsy or other procedure is needed(15) ? Bronchoscopy ? Echocardiogram(33) ? Indirect calorimetry ? Thoracentesis or chest tube ? Tracheostomy placement[F](35) ? Prone positioning(11)(13) ? Nuclear lung studies ? Electromyography and nerve conduction studies(26) ? Extracorporeal membrane oxygenation(36)(37)(38) ? Palliative care consultation(18)(24)  Benchmark Length of Stay (BLOS): Access diagnosis and procedure code-specific BLOS via  Search functions. Discharge Criteria Return to top of Respiratory Failure GRG - GRG [Expand All / Collapse All]  Continued inpatient stay is needed until 1 or more of the following are present: ? Acceptable patient status for next level of care is achieved. ? ALL of the following are present:  Weaning assessment performed   Weaning trials attempted    Ventilation status appropriate    Airway status acceptable    Respiratory status acceptable    Stable chest findings    No chest tube, or status acceptable    Neurologic status acceptable    Temperature status acceptable      Discharge Readiness Milestones, Not Met

## 2018-02-09 NOTE — Progress Notes (Signed)
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  She is completed 3 cycles of chemotherapy and is now status post bilateral salpingo-oophorectomy and omentectomy with debulking on 8/22. 8/23: Difficult with analgesic control.  This was complicated by patient becoming somnolent and hypotensive after adjustment of narcotics, treated with Saline and adjustment of medications. 8/24: Still somnolent, noted to be confused.  Tranfused 1 unit for hemoglobin of 6.7.  Internal medicine consulted for acute encephalopathy also postoperative acute kidney injury.  8/25: Remains encephalopathic.  Hemoglobin improved posttransfusion.  Progress note commenting on dyspnea. Had episode of vomiting shortly after arriving in [facility].  More hypertensive and tachycardic. 8/26 transferred to ICU w/ concern for perforation w/ free air noted.  Imipenem started. Went for exploratory laparotomy with washout.  No significant contamination identified, presumed spontaneously closed microperf. Returned with abdominal drains postoperative chest x-ray showing evidence of aspiration pneumonia; mixed respiratory and metabolic acidosis & in septic shock.  8/27: weaning pressors. Hgb dropped from 8.8 to 7.4; got 2 units of blood,  febrile, added. vancomycin.  Neupogen given for neutropenia.  8/28: Pressors off/shock resolved. NAG acidosis and Lactic acidosis resolved.  FiO2/PEEP down, attempted PSV.  WBC count up.  Started TPN.  8/29: FiO2 and PEEP requirements increased, had wean these down to 40/5 now 60/10.  Chest x-ray with worsening right-sided airspace disease likely reflecting de-recruitment. Delirium remains significant obstacle.  Beta-blocker, and clonidine patch added for hypertension    8/30: Diuresed 4.5 L the day prior with Lasix.  Little more awake.  X-ray improved.  Vancomycin discontinued.  Continuing diuresis, RASS goal changed to -1, dropping PEEP to 8. 9/01:  Vanco added back with fevers, clonidine patch 9/02: Sedation difficult, diuresing, remains on abx. CT abd pelvis given fevers.  9/3: found pelvic loculated fluid collection on CT abd/pelvis. Seen By IR but felt anatomically too risky for drainage attempt. Hypotensive so clonidine patch stopped and lopressor adjusted. Still spiking temps. Diuresis held 9/4: tmax 101.5 (on 9/3). Starting tubefeeds, weaning TNA. Still positive 2.3 liters since admit. Getting lasix. Started seroquel.    Subjective/interval events  Heavily sedated    Resolved hospital Problems  Septic shock 8/28 AKI Mild anion gap metabolic acidosis   Assessment & Plan:   Sepsis, fever with neutropenia: peritonitis (microperforation), complicated by aspiration pneumonia.  -ongoing fevers, 9/2 CT Abd concerning for loculated fluid collection->IR felt too risky for drainage anatomically  Plan Cont meropenem day 10 and vanc with eraxis  Day 9 Await pending cultures.  Will cont to trend fever curves. May need to consider re-imaging late in week.   Status post exploratory laparotomy for free air on 8/26 ; following bilateral salpingo-oophorectomy, omentectomy, and debulking surgery on 8/22 Plan Cont routine wound/drain care Starting tube feeds  Acute hypoxic respiratory failure in the setting of aspiration pneumonia/ARDS Right pleural effusion  Most recent pcxr 9/3: R?L airspace disease. Mix of effusion and PNA. Left almost cleared  Plan Cont full vent support PAD protocol RASS goal 0 to -1 Cont lasix F/u am cxr If effusion still present will need to consider right thoracentesis  Sinus tachycardia, hypertension, and grade 1 diastolic dysfunction with minimal troponin I bump.   Bradycardia - periods of brady to the 30's Labile BP  (w/episodes of  hypotension seemingly exacerbated by sedation) -Echo showed hyperdynamic ejection fraction with no wall motion abnormality Plan Lopressor w/ hold parameters Cont tele Diuresis  Add PRN labetolol   Acute metabolic encephalopathy.   -Suspect this is multifactorial and secondary to sepsis as well as residual narcotics and metabolic acidosis, this remains a significant barrier. Plan RASS goal 0 to -1 precedex and PRN fent  Adding seroquel  At risk for fluid and electrolyte imbalance: Anasarca Plan Minimize fluids  Trend chemistries   Severe Protein Calorie Malnutrition, prealbumin 3.5 Plan Starting tube feed. Vital AF 1.2 at 20 ml/hr. Advance 10 ml/hr every 8 hrs to rate of 50 Wean TPN   Postoperative anemia.  Did have coffee ground gastric tube output, this seems to have improved Hemoglobin stable Plan Trend cbc  Leukocytosis.  Initially was neutropenic however did receive Granix on 8/28 per oncology; stable, but remains elevated. Does have pelvic fluid collection Plan Follow CBC   Thrombocytopenia; stable Plan Ok to start LMWH (per d/w surgical services) Follow cbc  Hyperglycemia Plan ssi  Stage IV ovarian cancer, now status post bilateral salpingo-oophorectomy omentectomy and debulking surgery on 8/22 Plan Per gyn/onc  Best practice/Goals of care/disposition.   DVT prophylaxis: LMWH-->SCD (8/26); continuing SCDs as of 8/29 given thrombocytopenia GI prophylaxis: Changed to PPI on 8/27 Diet: NPO, starting TPN on 8/28 Mobility: BR Code Status: Full Code Family Communication:  Sons x2 updated at bedside am 9/3.    Disposition / Summary of Today's Plan 02/09/18   Cont lasix. Stop fent gtt. Add seroquel. Treat hypertension w/ PRN labetolol. I had hoped we would be weaning by now. Delirium, volume overload, ongoing fevers seem to be major obstacles at this point.   Consultants:  Internal medicine 8/25 Critical care 8/26 General surgery 8/26 GI  medicine 8/27 > did not feel she had overt GIB.  Rec's prophylaxis with PPI.  Signed off. Interventional radiology cons: 9/3 felt left fluid collection was anatomically too risky to drain  Procedures: 8/22 bilateral salpingo-nephrectomy omentectomy and debulking surgery  8/26 Intubation 8/26 Exploratory laparotomy, washout, placement of right upper quadrant x2 JP drain and left lower quadrant JP drain  Significant Diagnostic Tests: CT abdomen 8/26: Hemic bowel with portal venous gas and bowel pneumatosis with large amount of pneumoperitoneum.  Unclear as to site of perforation.  Multiple pockets of air adjacent to the sigmoid colon.  Enhancement of the peritoneum concerning for peritonitis.  There was aortic arterial sclerosis.  Echocardiogram 8/27: Particular ejection fraction 65 to 70%.  She has moderate LVH, and hyperdynamic LV function.  There is grade 1 diastolic dysfunction.  Trivial MR.  Normal left atrial size.  The IV C was not visualized  CT abdomen 9/2 >> drainage catheters within the pelvis, RUQ, LUQ.  Significant interval decrease in size of previously described intra-abdominal fluid, no residual free intraperitoneal air. There is a persistent loculated fluid & gas collection along the left pelvic sidewall measuring up to 10 cm.  Moderate bilateral pleural effusions, underlying opacities favored to represent atelectasis.  Marked wall thickening of the sigmoid colon.  Peripheral low attenuation within the spleen concerning for splenic infarcts.  Wall thickening of the urinary bladder.   Micro Data: Blood Culture x2 8/26 >>neg Sputum 8/27: Abundant yeast, few Candida albicans Blood Cx 9/1 >>  Respiratory 9/1 >> Urine 9/1 >> negative Peritoneal fluid 8/31 >> neg  Antimicrobials:  Meropenem 8/26 >> Vancomycin 8/27 >> 8/30; 9/1 >>  Eraxis 8/27 >>    Objective  Examination: General: heavily sedated.  HENT: still w/ mild scleral edema. Orally intubated.  Pulm: scattered  rhonchi. Decreased right base.  Card RRR w/out MRG Abd: mid abd dressing intact. Drains intact. Edema less. Hypoactive bowel sounds GU: clear yellow  Neuro sedated. No focal def Ext: warm and dry edema decreased. Strong pulses.    Blood pressure (Abnormal) 178/58, pulse 95, temperature 99.9 F (37.7 C), resp. rate (Abnormal) 23, height 5\' 3"  (1.6 m), weight 60.3 kg, SpO2 99 %.    Vent Mode: PRVC FiO2 (%):  [30 %-40 %] 30 % Set Rate:  [20 bmp-30 bmp] 20 bmp Vt Set:  [330 mL] 330 mL PEEP:  [8 cmH20] 8 cmH20 Plateau Pressure:  [17 cmH20-22 cmH20] 19 cmH20   Intake/Output Summary (Last 24 hours) at 02/09/2018 1044 Last data filed at 02/09/2018 0900 Gross per 24 hour  Intake 1561.05 ml  Output 1760 ml  Net -198.95 ml   Filed Weights   02/06/18 0541 02/07/18 0406 02/08/18 0600  Weight: 65.3 kg 63.2 kg 60.3 kg     Labs   CBC: Recent Labs  Lab 02/05/18 0518 02/06/18 1037 02/07/18 0327 02/08/18 0514 02/09/18 0400  WBC 14.3* 17.0* 14.8* 15.1* 14.2*  NEUTROABS 11.6* 15.0* 12.8* 12.8* 12.4*  HGB 10.9* 9.8* 9.7* 10.7* 10.6*  HCT 34.2* 31.4* 30.8* 33.9* 34.4*  MCV 82.0 82.6 83.0 81.5 82.9  PLT 65* 81* 92* 160 277   Basic Metabolic Panel: Recent Labs  Lab 02/04/18 0407 02/04/18 0612  02/05/18 0518  02/05/18 1615 02/06/18 1037 02/07/18 0327 02/07/18 1740 02/08/18 0514 02/09/18 0400  NA SPECIMEN CONTAMINATED, UNABLE TO PERFORM TEST(S). 146*   < >  --    < > 143 146* 145 138 137 140  K SPECIMEN CONTAMINATED, UNABLE TO PERFORM TEST(S). 2.9*   < >  --    < > 3.3* 3.2* 3.9 4.0 4.0 4.4  CL SPECIMEN CONTAMINATED, UNABLE TO PERFORM TEST(S). 101   < >  --    < > 89* 102 99 86* 86* 90*  CO2 SPECIMEN CONTAMINATED, UNABLE TO PERFORM TEST(S). 33*   < >  --    < > 39* 35* 38* 39* 37* 37*  GLUCOSE SPECIMEN CONTAMINATED, UNABLE TO PERFORM TEST(S). 203*   < >  --    < > 144* 247* 90 149* 142* 182*  BUN SPECIMEN CONTAMINATED, UNABLE TO PERFORM TEST(S). 21   < >  --    < > 26* 24* 27* 29*  31* 32*  CREATININE SPECIMEN CONTAMINATED, UNABLE TO PERFORM TEST(S). 0.61   < >  --    < > 0.61 0.45 0.50 0.61 0.64 0.60  CALCIUM SPECIMEN CONTAMINATED, UNABLE TO PERFORM TEST(S). 7.5*   < >  --    < > 7.7* 6.4* 7.0* 7.7* 7.9* 7.9*  MG SPECIMEN CONTAMINATED, UNABLE TO PERFORM TEST(S). 1.2*  --  1.6*  --  2.1 1.5* 1.7 2.1 1.9  --   PHOS SPECIMEN CONTAMINATED, UNABLE TO PERFORM TEST(S). 4.2  --  3.9  --   --  2.2* 4.0  --   --   --    < > = values in this interval not displayed.   GFR: Estimated Creatinine Clearance: 52.6 mL/min (by C-G formula based on SCr of 0.6 mg/dL). Recent Labs  Lab 02/06/18 1010 02/06/18 1037 02/07/18 0327 02/07/18 0342 02/08/18 0514 02/09/18 0400  WBC  --  17.0* 14.8*  --  15.1* 14.2*  LATICACIDVEN 1.3  --   --  0.8  --   --    Liver Function Tests: Recent Labs  Lab 02/03/18 0410 02/07/18 0327  AST 31 39  ALT 19 30  ALKPHOS 143* 100  BILITOT 1.1 0.6  PROT 4.6* 4.9*  ALBUMIN 1.7* 1.3*   No results for input(s): LIPASE, AMYLASE in the last 168 hours. No results for input(s): AMMONIA in the last 168 hours. ABG    Component Value Date/Time   PHART 7.330 (L) 02/08/2018 1018   PCO2ART 81.6 (HH) 02/08/2018 1018   PO2ART 79.9 (L) 02/08/2018 1018   HCO3 41.9 (H) 02/08/2018 1018   TCO2 17 (L) 01/31/2018 1728   ACIDBASEDEF 0.4 02/01/2018 0432   O2SAT 94.1 02/08/2018 1018    Coagulation Profile: No results for input(s): INR, PROTIME in the last 168 hours.   Cardiac Enzymes: Recent Labs  Lab 02/07/18 1740 02/07/18 2314 02/08/18 0514  TROPONINI 0.05* 0.04* 0.05*   HbA1C: No results found for: HGBA1C   CBG: Recent Labs  Lab 02/08/18 1540 02/08/18 1939 02/08/18 2306 02/09/18 0443 02/09/18 0728  GLUCAP 124* 139* 169* 145* 150*  Erick Colace ACNP-BC [place] Pager # [phone] OR # [phone] if no answer

## 2018-02-09 NOTE — Progress Notes (Signed)
Nutrition Follow-up  DOCUMENTATION CODES:   Not applicable  INTERVENTION:  - Continue TPN regimen per Pharmacy. - Will continue to monitor for POC and associated nutrition-related needs.   NUTRITION DIAGNOSIS:   Increased nutrient needs related to post-op healing as evidenced by estimated needs. 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 Presents this admission for robotic assisted bilateral salpingo-oophorectomy omentectomy interval debulking.   Significant Events: 8/22:ex lap with infra-gastric omentectomy bilateral salpingo-oophorectomy radical optimal tumor debulking, resection of anterior abdominal wall 8/24:remained somnolent and was confused, noted to have post-op UTI 8/26:remained confused and having chest pain; transferred to SDU for Cardene drip; x-ray showed free air and CT abd concerning for ischemic bowel, bowel pneumatosis, and large amount of pneumoperitoneum; repeat ex lap with washout. 8/27: triple lumen PICC placed. 8/28: TPN initiated; ILE hold x7 days; received Granix per Oncology.   Weight has been fluctuating throughout hospitalization so weight from 8/26 (58.3 kg) used to re-estimate needs. Patient with NGT to LIS with scant amount of output this AM. Patient has a triple lumen PICC and is receiving Clinimix E 5/20 @ 60 mL/hr. Spoke with Pharmacist who states planned goal for TPN of Clinimix E 5/20 @ 80 mL/hr with 20% ILE @ 20 mL/hr x12 hours/day. Plan for ILE start today (as this is day #7 of ICU status). Goal rate for TPN will provide 2170 kcal and 96 grams of protein.  Monitoring for ability to start TF via NGT. If TF unable to be initiated, recommend TPN goal  to be adjusted to Clinimix E 5/15 @ 75 mL/hr with 20% ILE @ 20 mL/hr x12 hours/day. This regimen will provide 1758 kcal (109% re-estimated kcal need) and 90 grams of protein.    Patient is currently intubated on ventilator support MV: 8.2 L/min Temp (24hrs), Avg:100.6 F (38.1 C), Min:97.3 F (36.3 C), Max:102.6 F (39.2 C) BP: 138/58 and MAP: 83  Medications reviewed; 40 mg IV Lasix BID, sliding scale Novolog, 4 units Novolog every 4 hours, 40 mg IV Protonix BID. Labs reviewed; CBGs: 145 and 150 mg/dL this AM, Cl: 90 mmol/L, BUN: 32 mg/dL, Ca: 7.9 mg/dL. IVF; D5 @ 10 mL/hr (41 kcal). Drips; Precedex @ 0.7 mcg/kg/hr and Fentanyl @ 50 mcg/hr.     Diet Order:   Diet Order            Diet NPO time specified  Diet effective now              EDUCATION NEEDS:   Education needs have been addressed  Skin:  Skin Assessment: Skin Integrity Issues: Skin Integrity Issues:: Incisions Incisions: abdominal (8/26)  Last BM:  9/2  Height:   Ht Readings from Last 1 Encounters:  01/31/18 _0  (1.6 m)    Weight:   Wt Readings from Last 1 Encounters:  02/08/18 60.3 kg    Ideal Body Weight:  52.3 kg  BMI:  Body mass index is 23.55 kg/m.  Estimated Nutritional Needs:   Kcal:  4235  Protein:  87-99 grams (1.5-1.7 grams/kg)  Fluid:  >/= 1.5 L/day     Jarome Matin, MS, RD, LDN, [facility] Inpatient Clinical Dietitian Pager # [phone] After hours/weekend pager # [phone]

## 2018-02-09 NOTE — Progress Notes (Signed)
Pharmacy Antibiotic Note  Marilla Boddy is a 72 y.o. female admitted on 01/27/2018 with hx of ovarian cancer for bilateral salpingo-oophorectomy and omentectomy with debulking on 8/22.  She now has developed suspected intra-abdominal infection.  CT abdomen results worrisome for ischemic bowel with portal venous gas, bowel pneumatosis, and large amount of pneumoperitoneum without clear source of perforation.  Pharmacy has been consulted for meropenem dosing on 8/26 and added vancomycin on 8/27 for pneumonia.  Anidulafungin added for empiric coverage given concern for intra-abdominal infection, ongoing fever.   Plan:  Continue Meropenem 1g IV q8h (day #10)  Continue Anidulafungin 100mg  IV q24h (day #9)  Continue Vancomycin 1g IV q24h (day #4) - check peak today, trough tomorrow, goal AUC 400-500  Follow up renal fxn, culture results, and clinical course.   Height: 5\' 3"  (160 cm) Weight: 132 lb 15 oz (60.3 kg) IBW/kg (Calculated) : 52.4  Temp (24hrs), Avg:100.6 F (38.1 C), Min:97.3 F (36.3 C), Max:102.6 F (39.2 C)  Recent Labs  Lab 02/05/18 0518  02/06/18 1010 02/06/18 1037 02/07/18 0327 02/07/18 0342 02/07/18 1740 02/08/18 0514 02/09/18 0400  WBC 14.3*  --   --  17.0* 14.8*  --   --  15.1* 14.2*  CREATININE  --    < >  --  0.45 0.50  --  0.61 0.64 0.60  LATICACIDVEN  --   --  1.3  --   --  0.8  --   --   --    < > = values in this interval not displayed.    Estimated Creatinine Clearance: 52.6 mL/min (by C-G formula based on SCr of 0.6 mg/dL).    Allergies  Allergen Reactions  . Oxycodone     confusion    Antimicrobials this admission: 8/26 Meropenem>> 8/27 Vancomycin >>8/30, resume 9/1 >>  8/27 Anidulafungin >>   Dose adjustments this admission:  Microbiology results: 8/24 UCx: NGF 8/25 MRSA PCR: negative 8/26 BCx: NGF 8/27 Trach asp: few candida albicans 8/31 Intraperitoneal drain#3: ngtd 9/1 BCx (x1): ngtd 9/1 Trach asp: rare yeast 9/1 UCx:  NGF  Thank you for allowing pharmacy to be a part of this patient's care.  Peggyann Juba, PharmD, BCPS Pager: [phone] 02/09/2018 10:56 AM

## 2018-02-09 NOTE — Progress Notes (Addendum)
Gynecologic Oncology Progress Note  72 year old female diagnosed with Stage IV ovarian cancer who received four cycles of neoadjuvant chemotherapy with carboplatin and taxol with fourth cycle completed on 12/20/17 .  She underwent interval debulking surgery with Dr. Skeet Latch on 01/27/18 consisting of an exploratory laparotomy infra-gastric omentectomy bilateral salpingo-oophorectomy radical optimal tumor debulking, resection of anterior abdominal wall.  Her post-operative course was complicated by altered mental status noted on POD1 with improvement after Narcan administration but with return of symptoms, acute renal injury, tachycardia, dyspnea (CT chest performed 01/30/18-neg for PE), anemia.  Her AKI and altered mental status appeared to improve on 01/30/18 in the am but symptoms worsened the evening of 01/30/18 with episodes of emesis, tachycardia, hypertension, and confusion. A CT scan on 01/31/18 revealed findings most consistent with ischemic bowel with portal venous gas and bowel pneumatosis and large amount of pneumoperitoneum with concerns for peritonitis.  She was taken back to the OR on 01/31/18 with operative findings of free air and large volume ascites/fluid, but unable to visualize site of bowel perforation, no evidence of ischemia. It was felt that aspiration most likely occurred during intubation.        Subjective: Patient intubated. [name]. He states last night he was working with his mother and she was able to follow some commands such as squeezing his hand but he feels her wore her out.     Objective: Vital signs in last 24 hours: Temp:  [97.3 F (36.3 C)-102.6 F (39.2 C)] 99.9 F (37.7 C) (09/04 0900) Pulse Rate:  [66-121] 95 (09/04 0900) Resp:  [17-29] 23 (09/04 0900) BP: (62-249)/(22-94) 178/58 (09/04 0900) SpO2:  [94 %-100 %] 99 % (09/04 0900) FiO2 (%):  [30 %-40 %] 30 % (09/04 0406) Last BM Date: 02/07/18  Intake/Output from previous day: 09/03 0701 - 09/04  0700 In: 2152.8 [I.V.:1438.9; NG/GT:180; IV Piggyback:533.9] Out: 1460 [Urine:1440; Drains:20]  Physical Examination: General: appears comfortable, does not respond to voice or vigorous stimulus. Resp: coarse breath sounds bilaterally, rhonchi noted Cardio: heart rate at 95, regular rhythm GI: incision: midline incision with gauze dressings, clean, no active drainage noted and abdomen slightly distended, hypoactive bowel sounds Extremities: generalized edema in all extrem improved since last assessment JP drain x3 in the abdomen charged. Minimal serous drainage noted in all three. Foley with clear, orange colored urine   Labs: WBC/Hgb/Hct/Plts:  14.2/10.6/34.4/164 (09/04 0400) BUN/Cr/glu/ALT/AST/amyl/lip:  32/0.60/--/--/--/--/-- (09/04 0400)   Assessment: 72 y.o. s/p Procedure(s): Interval debulking surgery with Dr. Skeet Latch on 01/27/18 consisting of an exploratory laparotomy infra-gastric omentectomy bilateral salpingo-oophorectomy radical optimal tumor debulking, resection of anterior abdominal wall.  Reoperation of EXPLORATORY LAPAROTOMY, ABDOMINAL WASHOUT on 01/31/18: critically ill  Heme: Post-operative anemia. Hgb 10.6 this am which is stable. Last transfusion 02/02/18.  Anemia related to acute critical illneess, no evidence of active bleeding.   ID: Persistent fever with max temp of 101.8 on 02/09/18. Presumed sepsis initially from gastrointestinal source but unable to ID the perforation after exp lap on 01/31/18. Contribution now from pneumonia and undrained left pelvic fluid collection seen on CT scan 02/07/18. WBC mildly elevated at 14.2 this am. Granix received 02/01/18 per Dr. Alvy Bimler.   Vancomycin stopped on 02/04/18 and then fevers began again. Restarted Vancomycin on 02/06/18. Anidulafungin initiated 02/01/18.  Meropenum IV started 01/31/18.  Blood cultures from 01/31/18 & 02/06/18 with no growth.  Tracheal aspirate from 02/01/18 resulting few gram positive rods. Repeated 02/06/18 showed few  yeast. JP drain culture from 02/05/18  no growth to date. IR feels no safe window for drainage of pelvic fluid collection.  Recommend reimaging later this week.   CV: Tachycardia and hypertension improved. BP sensitive with changes in sedation.  Critical care team are aggressively managing BP through hydralazine and sedation alterations. Likely some of the underlying hypotension on sedation is secondary to aggressive diuresis in setting of ARDS and pulm edema. Though still net positive volume status.  Resp: ARDS in setting of aspiration pneumonia and sepsis.   Chest 1 view last 9/3 in afternoon: No significant change since the prior exams. Persistent right lung infiltrates and persistent small bilateral pleural effusions. She has continued to make ventilator gains though not yet ready for spontaneous breathing as she has apparent increased WOB with reductions in sedation.  GI:  Tolerating po: No: intubated.  Bowel movement on 02/06/18, suggestive of return of bowel function which is promising. Protonix ordered. GI consultation appreciated.  CT AP on 01/31/18 with findings consistent with GI perforation.  No obvious perforation identified on re-operation on 01/31/18. NG with brown output (150cc in canister). TPN initiated 02/02/18. Start tube feeds per Dr. Denman George. If she tolerates this, will taper off TPN and remove drains if no evidence of enteric leak on tube feeds.   GU: AKI post-op resolved. Creatinine 0.6 02/09/18. Foley in place with orange colored urine.   FEN: No critical values. Replacement per CC.   Prophylaxis: SCDs in place. Plan to start prop lovenox today. No active bleeding and H&H stable.  Plan: Continue care per Critical Care.  Per Dr. Denman George: Plan to hold on removal of JP drains at this time per given fevers (would consider pulling them after we have started GI feeds with no signs of enteric leak/fistula).  Continue IV antibiotic regimen at this time since fluid collection unable to  be drained and remaining febrile.   Begin lovenox 40 mg SQ daily.  Start tube feeds per Dr. Denman George. Can then wean off TPN per pharmacy.  Plan for reimaging later this week to reassess pelvic fluid collection.  Jo Parker 02/09/2018, 10:23 AM

## 2018-02-10 ENCOUNTER — Other Ambulatory Visit: Payer: Medicare PPO

## 2018-02-10 ENCOUNTER — Ambulatory Visit: Payer: Medicare PPO | Admitting: Hematology and Oncology

## 2018-02-10 DIAGNOSIS — K651 Peritoneal abscess: Secondary | ICD-10-CM

## 2018-02-10 DIAGNOSIS — T8149XA Infection following a procedure, other surgical site, initial encounter: Secondary | ICD-10-CM | POA: Diagnosis not present

## 2018-02-10 LAB — CBC WITH DIFFERENTIAL/PLATELET
BASOS ABS: 0 10*3/uL (ref 0.0–0.1)
BASOS PCT: 0 %
Eosinophils Absolute: 0.1 10*3/uL (ref 0.0–0.7)
Eosinophils Relative: 1 %
HCT: 29.7 % — ABNORMAL LOW (ref 36.0–46.0)
Hemoglobin: 9.1 g/dL — ABNORMAL LOW (ref 12.0–15.0)
LYMPHS ABS: 0.8 10*3/uL (ref 0.7–4.0)
Lymphocytes Relative: 8 %
MCH: 25.6 pg — ABNORMAL LOW (ref 26.0–34.0)
MCHC: 30.6 g/dL (ref 30.0–36.0)
MCV: 83.4 fL (ref 78.0–100.0)
Monocytes Absolute: 0.5 10*3/uL (ref 0.1–1.0)
Monocytes Relative: 6 %
NEUTROS PCT: 85 %
Neutro Abs: 8.4 10*3/uL — ABNORMAL HIGH (ref 1.7–7.7)
PLATELETS: 146 10*3/uL — AB (ref 150–400)
RBC: 3.56 MIL/uL — AB (ref 3.87–5.11)
RDW: 24.1 % — ABNORMAL HIGH (ref 11.5–15.5)
WBC: 9.9 10*3/uL (ref 4.0–10.5)

## 2018-02-10 LAB — COMPREHENSIVE METABOLIC PANEL
ALBUMIN: 1.5 g/dL — AB (ref 3.5–5.0)
ALT: 80 U/L — AB (ref 0–44)
AST: 98 U/L — AB (ref 15–41)
Alkaline Phosphatase: 218 U/L — ABNORMAL HIGH (ref 38–126)
Anion gap: 11 (ref 5–15)
BUN: 38 mg/dL — AB (ref 8–23)
CHLORIDE: 91 mmol/L — AB (ref 98–111)
CO2: 39 mmol/L — AB (ref 22–32)
Calcium: 8 mg/dL — ABNORMAL LOW (ref 8.9–10.3)
Creatinine, Ser: 0.68 mg/dL (ref 0.44–1.00)
GFR calc Af Amer: >60 mL/min (ref >60)
GFR calc non Af Amer: >60 mL/min (ref >60)
GLUCOSE: 222 mg/dL — AB (ref 70–99)
Potassium: 4 mmol/L (ref 3.5–5.1)
SODIUM: 141 mmol/L (ref 135–145)
Total Bilirubin: 0.4 mg/dL (ref 0.3–1.2)
Total Protein: 6 g/dL — ABNORMAL LOW (ref 6.5–8.1)

## 2018-02-10 LAB — GLUCOSE, CAPILLARY
GLUCOSE-CAPILLARY: 119 mg/dL — AB (ref 70–99)
Glucose-Capillary: 127 mg/dL — ABNORMAL HIGH (ref 70–99)
Glucose-Capillary: 140 mg/dL — ABNORMAL HIGH (ref 70–99)
Glucose-Capillary: 162 mg/dL — ABNORMAL HIGH (ref 70–99)
Glucose-Capillary: 191 mg/dL — ABNORMAL HIGH (ref 70–99)
Glucose-Capillary: 89 mg/dL (ref 70–99)

## 2018-02-10 LAB — MAGNESIUM: MAGNESIUM: 1.8 mg/dL (ref 1.7–2.4)

## 2018-02-10 LAB — PHOSPHORUS: PHOSPHORUS: 5.2 mg/dL — AB (ref 2.5–4.6)

## 2018-02-10 LAB — VANCOMYCIN, TROUGH: Vancomycin Tr: 9 ug/mL — ABNORMAL LOW (ref 15–20)

## 2018-02-10 NOTE — Progress Notes (Addendum)
Jo Parker  QQV:[id number] DOB: February 22, 1946 DOA: 01/27/2018 PCP: [name], MD    Reason for Consult / Chief Complaint:  Sepsis  Consulting MD:  Gerarda Fraction  HPI/Brief Narrative   72 year old African-American female with presumed diagnosis of ovarian adenocarcinoma felt stage IVb.  She is completed 3 cycles of chemotherapy and is now status post bilateral salpingo-oophorectomy and omentectomy with debulking on 8/22.  8/26 transferred to ICU w/ concern for perforation w/ free air noted.  Imipenem started. Went for exploratory laparotomy with washout.  No significant contamination identified, presumed spontaneously closed microperf. Returned with abdominal drains postoperative chest x-ray showing evidence of aspiration pneumonia; mixed respiratory and metabolic acidosis & in septic shock.  8/27: weaning pressors. Hgb dropped from 8.8 to 7.4; got 2 units of blood,  febrile, added. vancomycin.  Neupogen given for neutropenia.  8/28: Pressors off/shock resolved. NAG acidosis and Lactic acidosis resolved.  FiO2/PEEP down, attempted PSV.  WBC count up.  Started TPN.  8/29: FiO2 and PEEP requirements increased, had wean these down to 40/5 now 60/10.  Chest x-ray with worsening right-sided airspace disease likely reflecting de-recruitment. Delirium remains significant obstacle.  Beta-blocker, and clonidine patch added for hypertension  8/30: Diuresed 4.5 L the day prior with Lasix.  Little more awake.  X-ray improved.  Vancomycin discontinued.  Continuing diuresis, RASS goal changed to -1, dropping PEEP to 8. 9/01:  Vanco added back with fevers, clonidine patch 9/02: Sedation difficult, diuresing, remains on abx. CT abd pelvis given fevers.  9/3: found pelvic loculated fluid collection on CT abd/pelvis. Seen By IR but felt anatomically too risky for drainage attempt. Hypotensive so clonidine patch stopped and lopressor adjusted. Still spiking temps. Diuresis held 9/4: tmax 101.5 (on 9/3). Starting  tubefeeds, weaning TNA. Still positive 2.3 liters since admit. Getting lasix. Started seroquel.  9/4: weaning down sedation. Upper 2 drains removed. Staples removed. Has some prurulent drainage from incision site.    Subjective/interval events  Sedated   Resolved hospital Problems  Septic shock 8/28 AKI Mild anion gap metabolic acidosis   Assessment & Plan:   Sepsis, fever with neutropenia: peritonitis (microperforation), complicated by aspiration pneumonia.  -ongoing fevers, 9/2 CT Abd concerning for loculated fluid collection->IR felt too risky for drainage anatomically  Plan Continue meropenem day 11, Vanco, Eraxis day 9 Cont meropenem day 10 and vanc with eraxis  Day 9 Await pending cultures.  Will cont to trend fever curves. May need to consider re-imaging late in week.   Status post exploratory laparotomy for free air on 8/26 ; following bilateral salpingo-oophorectomy, omentectomy, and debulking surgery on 8/22 Plan Cont routine wound/drain care Starting tube feeds  Acute hypoxic respiratory failure in the setting of aspiration pneumonia/ARDS Right pleural effusion  Most recent pcxr 9/3: R?L airspace disease. Mix of effusion and PNA. Left almost cleared  Plan Wean down PEEP to 5. Start PSV trials.  Diuresis Bedside US today to evaluate for pleural effusion.  Consider thoracentesis  Sinus tachycardia, hypertension, and grade 1 diastolic dysfunction with minimal troponin I bump.   Bradycardia - periods of brady to the 30's Labile BP (w/episodes of hypotension seemingly exacerbated by sedation) -Echo showed hyperdynamic ejection fraction with no wall motion abnormality Plan Lopressor w/ hold parameters Cont tele Diuresis  PRN labetelol  Acute metabolic encephalopathy.   -Suspect this is multifactorial and secondary to sepsis as well as residual narcotics and metabolic acidosis, this remains a significant barrier. Plan RASS goal 0 to -1 Weaning down  precedex. Fentanyl  PRN On seroquel  At risk for fluid and electrolyte imbalance: Anasarca Plan Follow BMP  Severe Protein Calorie Malnutrition, prealbumin 3.5 Plan Advancing tube feeds Weaning TPN  Postoperative anemia.  Did have coffee ground gastric tube output, this seems to have improved Hemoglobin stable Leukocytosis.  Initially was neutropenic however did receive Granix on 8/28 per oncology; stable, but remains elevated. Does have pelvic fluid collection Thrombocytopenia; stable Plan Follow CBC  Hyperglycemia Plan SSI coverage + Novolog  Stage IV ovarian cancer, now status post bilateral salpingo-oophorectomy omentectomy and debulking surgery on 8/22 Plan Per Gyn Onc  Best practice/Goals of care/disposition.   DVT prophylaxis: LMWH GI prophylaxis: Changed to PPI on 8/27 Diet: NPO, starting TPN on 8/28 Mobility: BR Code Status: Full Code Family Communication:  Sons x2 updated at bedside am 9/3.    Disposition / Summary of Today's Plan 02/10/18   Cont lasix. Stop fent gtt. Add seroquel. Treat hypertension w/ PRN labetolol. I had hoped we would be weaning by now. Delirium, volume overload, ongoing fevers seem to be major obstacles at this point.   Consultants:  Internal medicine 8/25 Critical care 8/26 General surgery 8/26 GI medicine 8/27 > did not feel she had overt GIB.  Rec's prophylaxis with PPI.  Signed off. Interventional radiology cons: 9/3 felt left fluid collection was anatomically too risky to drain  Procedures: 8/22 bilateral salpingo-nephrectomy omentectomy and debulking surgery  8/26 Intubation 8/26 Exploratory laparotomy, washout, placement of right upper quadrant x2 JP drain and left lower quadrant JP drain  Significant Diagnostic Tests: CT abdomen 8/26: Hemic bowel with portal venous gas and bowel pneumatosis with large amount of pneumoperitoneum.  Unclear as to site of perforation.  Multiple pockets of air adjacent to the sigmoid colon.   Enhancement of the peritoneum concerning for peritonitis.  There was aortic arterial sclerosis.  Echocardiogram 8/27: Particular ejection fraction 65 to 70%.  She has moderate LVH, and hyperdynamic LV function.  There is grade 1 diastolic dysfunction.  Trivial MR.  Normal left atrial size.  The IV C was not visualized  CT abdomen 9/2 >> drainage catheters within the pelvis, RUQ, LUQ.  Significant interval decrease in size of previously described intra-abdominal fluid, no residual free intraperitoneal air. There is a persistent loculated fluid & gas collection along the left pelvic sidewall measuring up to 10 cm.  Moderate bilateral pleural effusions, underlying opacities favored to represent atelectasis.  Marked wall thickening of the sigmoid colon.  Peripheral low attenuation within the spleen concerning for splenic infarcts.  Wall thickening of the urinary bladder.   Micro Data: Blood Culture x2 8/26 >>neg Sputum 8/27: Abundant yeast, few Candida albicans Blood Cx 9/1 >>  Respiratory 9/1 >> Urine 9/1 >> negative Peritoneal fluid 8/31 >> neg  Antimicrobials:  Meropenem 8/26 >> Vancomycin 8/27 >> 8/30; 9/1 >>  Eraxis 8/27 >>   Objective    Examination: Gen:      No acute distress HEENT:  EOMI, sclera anicteric, ETT Neck:     No masses; no thyromegaly Lungs:    Clear to auscultation bilaterally; normal respiratory effort CV:         Regular rate and rhythm; no murmurs Abd:      Mid abd dressings, pelvic drain in palce Ext:    No edema; adequate peripheral perfusion Skin:      Warm and dry; no rash Neuro: Sedated, no focal deficit.   Blood pressure (!) 157/40, pulse (!) 114, temperature (!) 100.6 F (38.1 C), resp. rate Marland Kitchen)  30, height 5\' 3"  (1.6 m), weight 62.2 kg, SpO2 99 %.    Vent Mode: PRVC FiO2 (%):  [30 %] 30 % Set Rate:  [20 bmp] 20 bmp Vt Set:  [330 mL] 330 mL PEEP:  [8 cmH20] 8 cmH20 Plateau Pressure:  [17 cmH20-24 cmH20] 21 cmH20   Intake/Output Summary (Last 24  hours) at 02/10/2018 1014 Last data filed at 02/10/2018 0500 Gross per 24 hour  Intake 3470.23 ml  Output 750 ml  Net 2720.23 ml   Filed Weights   02/07/18 0406 02/08/18 0600 02/10/18 0400  Weight: 63.2 kg 60.3 kg 62.2 kg     Labs   CBC: Recent Labs  Lab 02/06/18 1037 02/07/18 0327 02/08/18 0514 02/09/18 0400 02/10/18 0302  WBC 17.0* 14.8* 15.1* 14.2* 9.9  NEUTROABS 15.0* 12.8* 12.8* 12.4* 8.4*  HGB 9.8* 9.7* 10.7* 10.6* 9.1*  HCT 31.4* 30.8* 33.9* 34.4* 29.7*  MCV 82.6 83.0 81.5 82.9 83.4  PLT 81* 92* 160 164 161*   Basic Metabolic Panel: Recent Labs  Lab 02/04/18 0612  02/05/18 0518  02/06/18 1037 02/07/18 0327 02/07/18 1740 02/08/18 0514 02/09/18 0400 02/10/18 0302  NA 146*   < >  --    < > 146* 145 138 137 140 141  K 2.9*   < >  --    < > 3.2* 3.9 4.0 4.0 4.4 4.0  CL 101   < >  --    < > 102 99 86* 86* 90* 91*  CO2 33*   < >  --    < > 35* 38* 39* 37* 37* 39*  GLUCOSE 203*   < >  --    < > 247* 90 149* 142* 182* 222*  BUN 21   < >  --    < > 24* 27* 29* 31* 32* 38*  CREATININE 0.61   < >  --    < > 0.45 0.50 0.61 0.64 0.60 0.68  CALCIUM 7.5*   < >  --    < > 6.4* 7.0* 7.7* 7.9* 7.9* 8.0*  MG 1.2*  --  1.6*   < > 1.5* 1.7 2.1 1.9  --  1.8  PHOS 4.2  --  3.9  --  2.2* 4.0  --   --   --  5.2*   < > = values in this interval not displayed.   GFR: Estimated Creatinine Clearance: 52.6 mL/min (by C-G formula based on SCr of 0.68 mg/dL). Recent Labs  Lab 02/06/18 1010  02/07/18 0327 02/07/18 0342 02/08/18 0514 02/09/18 0400 02/10/18 0302  WBC  --    < > 14.8*  --  15.1* 14.2* 9.9  LATICACIDVEN 1.3  --   --  0.8  --   --   --    < > = values in this interval not displayed.   Liver Function Tests: Recent Labs  Lab 02/07/18 0327 02/10/18 0302  AST 39 98*  ALT 30 80*  ALKPHOS 100 218*  BILITOT 0.6 0.4  PROT 4.9* 6.0*  ALBUMIN 1.3* 1.5*   No results for input(s): LIPASE, AMYLASE in the last 168 hours. No results for input(s): AMMONIA in the last 168  hours. ABG    Component Value Date/Time   PHART 7.330 (L) 02/08/2018 1018   PCO2ART 81.6 (HH) 02/08/2018 1018   PO2ART 79.9 (L) 02/08/2018 1018   HCO3 41.9 (H) 02/08/2018 1018   TCO2 17 (L) 01/31/2018 1728   ACIDBASEDEF 0.4 02/01/2018 0432  O2SAT 94.1 02/08/2018 1018    Coagulation Profile: No results for input(s): INR, PROTIME in the last 168 hours.   Cardiac Enzymes: Recent Labs  Lab 02/07/18 1740 02/07/18 2314 02/08/18 0514  TROPONINI 0.05* 0.04* 0.05*   HbA1C: No results found for: HGBA1C   CBG: Recent Labs  Lab 02/09/18 1635 02/09/18 1935 02/09/18 2316 02/10/18 0323 02/10/18 0726  GLUCAP 174* 143* 197* 191* 140*    Rudell Marlowe MD [facility] 02/10/2018, 10:34 AM

## 2018-02-10 NOTE — Progress Notes (Signed)
Gynecologic Oncology Progress Note  72 year old female diagnosed with Stage IV ovarian cancer who received four cycles of neoadjuvant chemotherapy with carboplatin and taxol with fourth cycle completed on 12/20/17 .  She underwent interval debulking surgery with Dr. Skeet Latch on 01/27/18 consisting of an exploratory laparotomy infra-gastric omentectomy bilateral salpingo-oophorectomy radical optimal tumor debulking, resection of anterior abdominal wall.  Her post-operative course was complicated by altered mental status noted on POD1 with improvement after Narcan administration but with return of symptoms, acute renal injury, tachycardia, dyspnea (CT chest performed 01/30/18-neg for PE), anemia.  Her AKI and altered mental status appeared to improve on 01/30/18 in the am but symptoms worsened the evening of 01/30/18 with episodes of emesis, tachycardia, hypertension, and confusion. A CT scan on 01/31/18 revealed findings most consistent with ischemic bowel with portal venous gas and bowel pneumatosis and large amount of pneumoperitoneum with concerns for peritonitis.  She was taken back to the OR on 01/31/18 with operative findings of free air and large volume ascites/fluid, but unable to visualize site of bowel perforation, no evidence of ischemia. It was felt that aspiration most likely occurred during intubation.        Subjective: Patient intubated. [name].  Objective: Vital signs in last 24 hours: Temp:  [98.4 F (36.9 C)-101.1 F (38.4 C)] 99.5 F (37.5 C) (09/05 0600) Pulse Rate:  [43-121] 103 (09/05 0600) Resp:  [14-34] 25 (09/05 0600) BP: (111-178)/(29-58) 145/29 (09/05 0600) SpO2:  [98 %-100 %] 100 % (09/05 0600) FiO2 (%):  [30 %] 30 % (09/05 0400) Weight:  [137 lb 2 oz (62.2 kg)] 137 lb 2 oz (62.2 kg) (09/05 0400) Last BM Date: 02/07/18  Intake/Output from previous day: 09/04 0701 - 09/05 0700 In: 3846.8 [I.V.:937.1; NG/GT:500; IV Piggyback:609.8] Out: 1050 [Urine:1025;  Drains:25]  Physical Examination: General: appears comfortable, does not respond to voice or vigorous stimulus. Resp: coarse breath sounds bilaterally, rhonchi noted Cardio: heart rate at 95, regular rhythm GI: incision: midline incision with gauze dressings, clean, no active drainage noted, mild inferior incisional erythema that is blanching, and abdomen slightly distended, hypoactive bowel sounds Extremities: generalized edema in all extrem improved since last assessment JP drain x3 in the abdomen charged. Minimal serous drainage noted in all three. Foley with clear, orange colored urine   Labs: WBC/Hgb/Hct/Plts:  9.9/9.1/29.7/146 (09/05 0302) BUN/Cr/glu/ALT/AST/amyl/lip:  38/0.68/--/80/98/--/-- (09/05 0302)   Assessment: 72 y.o. s/p Procedure(s): Interval debulking surgery with Dr. Skeet Latch on 01/27/18 consisting of an exploratory laparotomy infra-gastric omentectomy bilateral salpingo-oophorectomy radical optimal tumor debulking, resection of anterior abdominal wall.  Reoperation of EXPLORATORY LAPAROTOMY, ABDOMINAL WASHOUT on 01/31/18: critically ill  Heme: Post-operative anemia. Hgb 9+ this am which is stable. Last transfusion 02/02/18.  Anemia related to acute critical illneess, no evidence of active bleeding.   ID: Persistent fever with max temp of 101.8 on 02/09/18. Presumed sepsis initially from gastrointestinal source but unable to ID the perforation after exp lap on 01/31/18. Contribution now from pneumonia and undrained left pelvic fluid collection seen on CT scan 02/07/18. WBC mildly elevated at 14.2 this am. Granix received 02/01/18 per Dr. Alvy Bimler.   Vancomycin stopped on 02/04/18 and then fevers began again. Restarted Vancomycin on 02/06/18. Anidulafungin initiated 02/01/18.  Meropenum IV started 01/31/18.  Blood cultures from 01/31/18 & 02/06/18 with no growth.  Tracheal aspirate from 02/01/18 resulting few gram positive rods. Repeated 02/06/18 showed few yeast. JP drain culture from  02/05/18 no growth to date. IR feels no safe window for drainage  of pelvic fluid collection.  Recommend reimaging later this week.  Appreciate ID input (no changes to regimen recommended at this time).   CV: Tachycardia and hypertension improved. BP sensitive with changes in sedation.  Critical care team are aggressively managing BP through hydralazine and sedation alterations. Likely some of the underlying hypotension on sedation is secondary to aggressive diuresis in setting of ARDS and pulm edema. Though still net positive volume status.  Resp: ARDS in setting of aspiration pneumonia and sepsis.   Chest 1 view  9/3 in afternoon: No significant change since the prior exams. Persistent right lung infiltrates and persistent small bilateral pleural effusions. She has continued to make ventilator gains though not yet ready for spontaneous breathing as she has apparent increased WOB with reductions in sedation.  GI:  Tolerating po: No: intubated.  Bowel movement on 02/06/18, suggestive of return of bowel function. Protonix ordered.  GI consultation appreciated.  CT AP on 01/31/18 with findings consistent with GI perforation.  No obvious perforation identified on re-operation on 01/31/18. NG with brown output (150cc in canister).  TPN initiated 02/02/18.  Continue to increase tube feeds and taper off TPN and remove drains if no evidence of enteric leak on tube feeds.   GU: AKI post-op resolved. Creatinine 0.6 02/10/18. Foley in place with orange colored urine.   FEN: No critical values. Replacement per CC.   Prophylaxis: SCDs in place. Continue Lovenox prophylaxis. No active bleeding and H&H stable.  Plan: Continue care per Critical Care.  Per Dr. Denman George: Remove upper 2  JP drains (1&2). Keep pelvic drain (#3). Remove staples.  Continue IV antibiotic regimen at this time since fluid collection unable to be drained and remaining febrile.   Begin lovenox 40 mg SQ daily.  Continue tube feeds and  wean off TPN per pharmacy.  Plan for reimaging later this week to reassess pelvic fluid collection.  Thereasa Solo 02/10/2018, 7:42 AM

## 2018-02-10 NOTE — Progress Notes (Addendum)
Patient ID: Jo Parker, female   DOB: 08/13/1945, 72 y.o.   MRN: [id number]         [facility]  Date of Admission:  01/27/2018           Day 11 meropenem        Day 10 vancomycin        Day 10 anidulafungin ASSESSMENT: Her temperature curve is slowly trending down and her leukocytosis has resolved.  However she remains critically ill.  It now appears that she has an abdominal wound infection in addition to intra-abdominal abscesses and pneumonia.  PLAN: 1. Continue current antimicrobial therapy 2. I have reviewed the situation with her family  Active Problems:   Severe sepsis ([place])   Peritonitis ([place])   HCAP (healthcare-associated pneumonia)   Postoperative wound infection   Primary peritoneal carcinomatosis ([name])   Ovarian cancer ([place])   Status post bilateral salpingo-oophorectomy   Severe protein-calorie malnutrition ([name])   COPD (chronic obstructive pulmonary disease) ([name])   HLD (hyperlipidemia)   Absolute anemia   White coat syndrome with hypertension   AKI (acute kidney injury) ([place])   Acute metabolic encephalopathy   Acute respiratory distress   Low urine output   Somnolence   At risk for fluid volume overload   Free intraperitoneal air   Acute respiratory failure ([name])   Pressure injury of skin   Scheduled Meds: . chlorhexidine gluconate (MEDLINE KIT)  15 mL Mouth Rinse BID  . enoxaparin (LOVENOX) injection  40 mg Subcutaneous Q24H  . free water  100 mL Per Tube Q8H  . furosemide  40 mg Intravenous Q12H  . insulin aspart  0-20 Units Subcutaneous Q4H  . insulin aspart  4 Units Subcutaneous Q4H  . ipratropium-albuterol  3 mL Nebulization TID  . mouth rinse  15 mL Mouth Rinse 10 times per day  . metoprolol tartrate  2.5 mg Intravenous Q6H  . pantoprazole (PROTONIX) IV  40 mg Intravenous Q12H  . QUEtiapine  25 mg Per Tube BID  . sodium chloride flush  10-40 mL Intracatheter Q12H   Continuous Infusions: . anidulafungin  Stopped (02/10/18 1234)  . dexmedetomidine 0.6 mcg/kg/hr (02/10/18 1600)  . dextrose 10 mL/hr at 02/10/18 1600  . feeding supplement (VITAL AF 1.2 CAL) 40 mL/hr at 02/10/18 0500  . meropenem (MERREM) IV Stopped (02/10/18 1125)  . Marland KitchenTPN (CLINIMIX-E) Adult 30 mL/hr at 02/10/18 1600  . vancomycin Stopped (02/10/18 1314)   PRN Meds:.acetaminophen (TYLENOL) oral liquid 160 mg/5 mL, fentaNYL (SUBLIMAZE) injection, hydrALAZINE, iohexol, labetalol, midazolam, sodium chloride flush  Review of Systems: Review of Systems  Unable to perform ROS: Intubated    Allergies  Allergen Reactions  . Oxycodone     confusion    OBJECTIVE: Vitals:   02/10/18 1400 02/10/18 1401 02/10/18 1500 02/10/18 1600  BP: (!) 117/42  (!) 137/49 (!) 173/57  Pulse: (!) 108  (!) 119 (!) 113  Resp: (!) 30  (!) 30 (!) 32  Temp: 100 F (37.8 C)  100.2 F (37.9 C) (!) 100.6 F (38.1 C)  TempSrc:      SpO2: 100% 100% 98% 100%  Weight:      Height:       Body mass index is 24.29 kg/m.  Physical Exam  Constitutional:  She remains sedated on the ventilator.  Her nurse reports that she may have had some purposeful responses to commands this morning.  Cardiovascular: Normal rate, regular rhythm and normal heart sounds.  No murmur heard.  Pulmonary/Chest: She has no wheezes. She has no rales.  Abdominal: Soft. She exhibits no distension.  Two upper abdomen drains were removed this morning.  Her pelvic drain remains in.  There is only a small amount of serosanguineous fluid in the drain bulb.  This morning the midportion of her abdominal incision was opened and "cloudy dark red tinged purulent fluid" drained out.  Skin: No rash noted.  IV sites look okay.    Lab Results Lab Results  Component Value Date   WBC 9.9 02/10/2018   HGB 9.1 (L) 02/10/2018   HCT 29.7 (L) 02/10/2018   MCV 83.4 02/10/2018   PLT 146 (L) 02/10/2018    Lab Results  Component Value Date   CREATININE 0.68 02/10/2018   BUN 38 (H)  02/10/2018   NA 141 02/10/2018   K 4.0 02/10/2018   CL 91 (L) 02/10/2018   CO2 39 (H) 02/10/2018    Lab Results  Component Value Date   ALT 80 (H) 02/10/2018   AST 98 (H) 02/10/2018   ALKPHOS 218 (H) 02/10/2018   BILITOT 0.4 02/10/2018     Microbiology: Recent Results (from the past 240 hour(s))  Culture, respiratory (non-expectorated)     Status: None   Collection Time: 02/01/18  8:11 AM  Result Value Ref Range Status   Specimen Description   Final    TRACHEAL ASPIRATE Performed at [facility] [address] [place], [place] [address]    Special Requests   Final    NONE Performed at [facility], [place] [address] [place], [place] [address]    Gram Stain   Final    NO SQUAMOUS EPITHELIAL CELLS PRESENT ABUNDANT WBC PRESENT, PREDOMINANTLY PMN ABUNDANT YEAST FEW GRAM POSITIVE RODS Performed at [facility], [place] [address], [place], [place] [address]    Culture FEW CANDIDA ALBICANS  Final   Report Status 02/03/2018 FINAL  Final  Body fluid culture     Status: None   Collection Time: 02/05/18 11:54 AM  Result Value Ref Range Status   Specimen Description   Final    PERITONEAL INTRAPERITONEAL DRAIN 3 Performed at [facility] [address], [place], [place] [address]    Special Requests   Final    NONE Performed at [facility], [place] [address] [place], [place] [address]    Gram Stain   Final    RARE WBC PRESENT, PREDOMINANTLY PMN NO ORGANISMS SEEN    Culture   Final    NO GROWTH 3 DAYS Performed at [facility] [address] [place], [place] [address]    Report Status 02/08/2018 FINAL  Final  Culture, blood (routine x 2)     Status: None (Preliminary result)   Collection Time: 02/06/18  2:42 AM  Result Value Ref Range Status   Specimen Description   Final    BLOOD BLOOD RIGHT HAND Performed at [facility] [address], [place], [place]  [address]    Special Requests   Final    BOTTLES DRAWN AEROBIC ONLY Blood Culture results may not be optimal due to an inadequate volume of blood received in culture bottles Performed at [facility] [address] [place], [place] [address]    Culture   Final    NO GROWTH 4 DAYS Performed at [facility], [place] [address], [place], [place] [address]    Report Status PENDING  Incomplete  Urine Culture     Status: None   Collection Time: 02/06/18 10:40 AM  Result Value Ref Range Status   Specimen Description   Final    urine Performed at [facility], [place] [address], [place], [place] [address]    Special Requests   Final    NONE Performed at [facility], [place] [address] [place], [place] [address]    Culture   Final    NO GROWTH Performed at [facility], [place] [address], [place], [place] [address]    Report Status 02/07/2018 FINAL  Final  Culture, respiratory (non-expectorated)     Status: None   Collection Time: 02/06/18 11:44 AM  Result Value Ref Range Status   Specimen Description   Final    TRACHEAL ASPIRATE TRACHEAL ASPIRATE Performed at [facility] [address], [place], [place] [address]    Special Requests   Final    NONE Performed at [facility], [place] [address] [place], [place] [address]    Gram Stain   Final    FEW WBC PRESENT, PREDOMINANTLY PMN RARE YEAST Performed at [facility], [place] [address], [place], [place] [address]    Culture FEW CANDIDA ALBICANS FEW CANDIDA GLABRATA   Final   Report Status 02/09/2018 FINAL  Final    Michel Bickers, MD [facility] [phone] pager   [phone] cell 02/10/2018, 5:09 PM

## 2018-02-10 NOTE — Progress Notes (Addendum)
Pharmacy Antibiotic Note  Jo Parker is a 72 y.o. female admitted on 01/27/2018 with hx of ovarian cancer for bilateral salpingo-oophorectomy and omentectomy with debulking on 8/22.  She now has developed suspected intra-abdominal infection.  CT abdomen results worrisome for ischemic bowel with portal venous gas, bowel pneumatosis, and large amount of pneumoperitoneum without clear source of perforation.  Pharmacy has been consulted for meropenem dosing on 8/26 and added vancomycin on 8/27 for pneumonia.  Anidulafungin added for empiric coverage given concern for intra-abdominal infection, ongoing fever.   Plan:  Continue Meropenem 1g IV q8h (day #11)  Continue Anidulafungin 100mg  IV q24h (day #10)  Continue Vancomycin 1g IV q24h (day #5 of resumption), as AUC within goal range (AUC = 485, goal AUC 400-500)  Follow up renal fxn, culture results, clinical course, ID recommendations.   Height: 5\' 3"  (160 cm) Weight: 137 lb 2 oz (62.2 kg) IBW/kg (Calculated) : 52.4  Temp (24hrs), Avg:99.6 F (37.6 C), Min:98.6 F (37 C), Max:101.1 F (38.4 C)  Recent Labs  Lab 02/06/18 1010 02/06/18 1037 02/07/18 0327 02/07/18 0342 02/07/18 1740 02/08/18 0514 02/09/18 0400 02/09/18 1440 02/10/18 0302 02/10/18 1200  WBC  --  17.0* 14.8*  --   --  15.1* 14.2*  --  9.9  --   CREATININE  --  0.45 0.50  --  0.61 0.64 0.60  --  0.68  --   LATICACIDVEN 1.3  --   --  0.8  --   --   --   --   --   --   VANCOTROUGH  --   --   --   --   --   --   --   --   --  9*  VANCOPEAK  --   --   --   --   --   --   --  36  --   --     Estimated Creatinine Clearance: 52.6 mL/min (by C-G formula based on SCr of 0.68 mg/dL).    Allergies  Allergen Reactions  . Oxycodone     confusion    Antimicrobials this admission: 8/26 Meropenem>> 8/27 Vancomycin >>8/30, resume 9/1 >>  8/27 Anidulafungin >>   Dose adjustments this admission: 9/4 1440 Vanc peak = 36 mcg/mL, 9/5 1200 Vanc trough = 9 mcg/mL --> AUC  485, no change to Vanc regimen  Microbiology results: 8/24 UCx: NGF 8/25 MRSA PCR: negative 8/26 BCx: NGF 8/27 Trach asp: few candida albicans 8/31 Intraperitoneal drain#3: NGF 9/1 BCx (x1): ngtd 9/1 Trach asp: few candida albicans, few candida glabrata  9/1 UCx: NGF 9/5 body fluid from wound: sent  Thank you for allowing pharmacy to be a part of this patient's care.   Lindell Spar, PharmD, BCPS Pager: [phone] 02/10/2018 1:20 PM

## 2018-02-10 NOTE — Progress Notes (Addendum)
Gynecologic Oncology Progress Note  72 year old female diagnosed with Stage IV ovarian cancer who received four cycles of neoadjuvant chemotherapy with carboplatin and taxol with fourth cycle completed on 12/20/17 .  She underwent interval debulking surgery with Dr. Skeet Latch on 01/27/18 consisting of an exploratory laparotomy infra-gastric omentectomy bilateral salpingo-oophorectomy radical optimal tumor debulking, resection of anterior abdominal wall.  Her post-operative course was complicated by altered mental status noted on POD1 with improvement after Narcan administration but with return of symptoms, acute renal injury, tachycardia, dyspnea (CT chest performed 01/30/18-neg for PE), anemia.  Her AKI and altered mental status appeared to improve on 01/30/18 in the am but symptoms worsened the evening of 01/30/18 with episodes of emesis, tachycardia, hypertension, and confusion. A CT scan on 01/31/18 revealed findings most consistent with ischemic bowel with portal venous gas and bowel pneumatosis and large amount of pneumoperitoneum with concerns for peritonitis.  She was taken back to the OR on 01/31/18 with operative findings of free air and large volume ascites/fluid, but unable to visualize site of bowel perforation, no evidence of ischemia. It was felt that aspiration most likely occurred during intubation.        Subjective: Patient intubated. [name]. Son states it was a quiet night.  Verbalizing understanding that we would be planning on removing JP#1 and JP#2 along with her abdominal staples during this visit.  No concerns voiced.  Objective: Vital signs in last 24 hours: Temp:  [98.4 F (36.9 C)-101.1 F (38.4 C)] 100.6 F (38.1 C) (09/05 0900) Pulse Rate:  [43-120] 114 (09/05 0900) Resp:  [14-34] 30 (09/05 0900) BP: (111-164)/(29-58) 157/40 (09/05 0900) SpO2:  [98 %-100 %] 99 % (09/05 0900) FiO2 (%):  [30 %] 30 % (09/05 0837) Weight:  [137 lb 2 oz (62.2 kg)] 137 lb 2 oz (62.2  kg) (09/05 0400) Last BM Date: 02/07/18  Intake/Output from previous day: 09/04 0701 - 09/05 0700 In: 3846.8 [I.V.:937.1; NG/GT:500; IV Piggyback:609.8] Out: 1050 [Urine:1025; Drains:25]  Physical Examination: General: appears comfortable, does not respond to voice or vigorous stimulus. Resp: coarse breath sounds bilaterally Cardio: heart rate at 112, regular rhythm GI: abdomen slightly distended, hypoactive bowel sounds but more active in right lower quadrant. Incontinent episode of liquid stool noted. Erythema noted around lower aspect of incision and area blanching. 28 staples removed from the midline incision without difficulty. Upon staple removal, the lower aspect of the midline incision began draining cloudy dark red tinged purulent fluid.  No odor noted.  Culture obtained.  Open wound measuring 4 cm in length, 1 cm in width, 4 cm in depth.  Fascia intact and surrounding tissue well perfused. Opening packed with normal saline dampened kerlix gauze.  Steri strips applied to the upper aspect of the incision.  Under the umbilicus, superficial opening noted at this time.  No evidence of wound separation in this area at this time but RN notified that the incision opening could expand to include the superficial opening under the umbilicus. Advised wet to dry dressing changes would continue to the wound opening if this occurred.  Consideration of a wound vac if the opening becomes more extensive deferred to Dr. Denman George. Extremities: generalized edema in all extrem improved since last assessment JP drain x3 in the abdomen charged. Minimal serous drainage noted in all three. JP#1 and JP#2 removed without difficulty.  Patient had slight elevation in HR to 122 which trended downward back to 109 after one minute after drain removal.   Foley  with clear, orange colored urine   Labs: WBC/Hgb/Hct/Plts:  9.9/9.1/29.7/146 (09/05 0302) BUN/Cr/glu/ALT/AST/amyl/lip:  38/0.68/--/80/98/--/-- (09/05  0302)  Assessment: 72 y.o. s/p Procedure(s): Interval debulking surgery with Dr. Skeet Latch on 01/27/18 consisting of an exploratory laparotomy infra-gastric omentectomy bilateral salpingo-oophorectomy radical optimal tumor debulking, resection of anterior abdominal wall.  Reoperation of EXPLORATORY LAPAROTOMY, ABDOMINAL WASHOUT on 01/31/18: critically ill  Heme: Post-operative anemia. Hgb 9.1 this am which is stable. Last transfusion 02/02/18.  Anemia related to acute critical illneess, no evidence of active bleeding.   ID: Persistent fever with max temp of 101.1 on 02/10/18. Presumed sepsis initially from gastrointestinal source but unable to ID the perforation after exp lap on 01/31/18. Contribution now from pneumonia and undrained left pelvic fluid collection seen on CT scan 02/07/18. WBC mildly elevated at 14.2 this am. Granix received 02/01/18 per Dr. Alvy Bimler.   Vancomycin stopped on 02/04/18 and then fevers began again. Restarted Vancomycin on 02/06/18. Anidulafungin initiated 02/01/18.  Meropenum IV started 01/31/18.  Blood cultures from 01/31/18 & 02/06/18 with no growth.  Tracheal aspirate from 02/01/18 resulting few gram positive rods. Repeated 02/06/18 showed few yeast. JP drain culture from 02/05/18 no growth to date. IR feels no safe window for drainage of pelvic fluid collection.  Recommend reimaging later this week.  Appreciate ID input (no changes to regimen recommended at this time).  Wound culture send from abdominal wound opening. Continue current IV antibiotic regimen.  CV: Mild tachycardia intermittently. BP sensitive with changes in sedation. Telemetry, diuresis today per CC, lopressor, and labetalol ordered PRN.  Resp: ARDS in setting of aspiration pneumonia and sepsis.   Chest 1 view  9/3 in afternoon: No significant change since the prior exams. Persistent right lung infiltrates and persistent small bilateral pleural effusions. Weaning down PEEP today per CC. Possible thoracentesis based  on [name] for eval for pleural effusion.  GI:  Tolerating po: No: intubated.  Loose bowel movement on 02/10/18, suggestive of return of bowel function. Protonix ordered.  GI consultation appreciated.  CT AP on 01/31/18 with findings consistent with GI perforation.  No obvious perforation identified on re-operation on 01/31/18. NG with brown output (150cc in canister).  TPN initiated 02/02/18.  Continue to increase tube feeds and taper off TPN. No evidence of enteric leak in JP drainage on tube feeds.   GU: AKI post-op resolved. Creatinine 0.68 02/10/18. Foley in place with orange colored urine.   FEN: No critical values. Replacement per CC.   Prophylaxis: SCDs in place. Continue Lovenox prophylaxis. No active bleeding and H&H stable.  Plan: Continue care per Critical Care.  Per Dr. Denman George: Removed upper 2  JP drains (1&2). Keep pelvic drain (#3). Removed staples.  Wet to dry dressing changes to the opening in the lower aspect of the incision twice daily.  Continue IV antibiotic regimen at this time since fluid collection unable to be drained and remaining febrile.   Lovenox 40 mg SQ daily.  Continue tube feeds and wean off TPN per pharmacy.  Plan for reimaging tomorrow to reassess pelvic fluid collection.  Lolamae Voisin D Tandy Grawe 02/10/2018, 10:48 AM

## 2018-02-10 NOTE — Progress Notes (Signed)
[facility] CONSULT NOTE   Pharmacy Consult for TPN Indication: bowel perforation  Patient Measurements: Height: 5' 3" (160 cm) Weight: 137 lb 2 oz (62.2 kg) IBW/kg (Calculated) : 52.4 TPN AdjBW (KG): 58.3 Body mass index is 24.29 kg/m.  Insulin Requirements: 22 units resistant SSI (no meds PTA) yesterday  - Scheduled Novolog 4 units q4h (started 8/30 per MD) = 24 units/ 24 hrs  Current Nutrition: NPO; TPN, TF   IVF: D5W @ 10 ml/hr  Central access: Implanted port 10/14/17, triple lumen PICC 02/01/18 TPN start date: 02/02/2018  ASSESSMENT                                                                                                          HPI: Pt is a 72 year old with ovarian cancer status post bilateral salpingo-oophorectomy and omentectomy with debulking on 8/22. Ex lap on 8/26. Pt remains in ICU critically ill on pressors, vent support.  She has had poor appetite and poor PO intake since admission and for unknown amount of time PTA and will be at risk for refeeding syndrome.  Significant events:  - 8/27 Start TPN. Hold lipids for first 7 days while pt in ICU  - 8/30 scheduled Novolog insulin added q4h - 9/2 CT scan of abdomen shows undrained left pelvic fluid collection, IR consulted - defer drainage for now  - 9/4 Lipids started with TPN. Tube feeds (Vital AF 1.2 Cal) initiated at 63m/hr, order to increase by 166mhr every 8 hours to goal rate of 5010mr   Today:   Glucose - CBGs 140-197 (goal < 150). No history of DM  Electrolytes - Phos elevated at 5.2, Cl- low, CO2 elevated, others WNL  Renal - SCr stable, BUN elevated  I/O: Lasix 86m39m q12h; UOP 1.025L/24hr  LFTs - AST/ALT, Alk Phos increased/elevated, Tbili WNL, albumin low  TGs - WNL, 39 (9/2)  Prealbumin - low and decreasing, 3.5 (9/2)  Tube feeds currently at 86mL29m(goal rate 50mL/39m NUTRITIONAL GOALS                                                                                              RD recs (9/4): Kcal:  1613, Protein:  87-99 grams (1.5-1.7 grams/kg), Fluid:  >/= 1.5 L/day  Based on published guidelines, while the patient meets ICU status the initiation of lipids delayed for 7 days or until transition out of ICU (start date 9/4). Goal will be to meet 100% of the patient's protein needs and approximately 80% of caloric needs.  Clinimix 5/20 @ 80 mL/hr to provide 96 grams protein (100% of goal) and 1689 kcal (100% of goal)   PLAN  Per discussion with CCM, okay to wean off TPN today.  Will decrease TPN rate by 1/2 to 14m/hr now, stop at 1800.   Defer further insulin adjustments to CCM.   D/C TPN lab panels.    JLindell Spar PharmD, BCPS Pager: [phone]/10/2017 9:42 AM

## 2018-02-11 ENCOUNTER — Encounter (HOSPITAL_COMMUNITY): Payer: Self-pay | Admitting: Radiology

## 2018-02-11 ENCOUNTER — Inpatient Hospital Stay (HOSPITAL_COMMUNITY): Payer: Medicare PPO

## 2018-02-11 DIAGNOSIS — T8149XD Infection following a procedure, other surgical site, subsequent encounter: Secondary | ICD-10-CM

## 2018-02-11 DIAGNOSIS — J690 Pneumonitis due to inhalation of food and vomit: Secondary | ICD-10-CM

## 2018-02-11 LAB — CBC WITH DIFFERENTIAL/PLATELET
BASOS PCT: 0 %
Basophils Absolute: 0 10*3/uL (ref 0.0–0.1)
Eosinophils Absolute: 0.1 10*3/uL (ref 0.0–0.7)
Eosinophils Relative: 2 %
HEMATOCRIT: 30.2 % — AB (ref 36.0–46.0)
HEMOGLOBIN: 9.3 g/dL — AB (ref 12.0–15.0)
LYMPHS PCT: 13 %
Lymphs Abs: 1.2 10*3/uL (ref 0.7–4.0)
MCH: 25.5 pg — ABNORMAL LOW (ref 26.0–34.0)
MCHC: 30.8 g/dL (ref 30.0–36.0)
MCV: 82.7 fL (ref 78.0–100.0)
Monocytes Absolute: 0.8 10*3/uL (ref 0.1–1.0)
Monocytes Relative: 9 %
NEUTROS ABS: 6.8 10*3/uL (ref 1.7–7.7)
Neutrophils Relative %: 76 %
Platelets: 203 10*3/uL (ref 150–400)
RBC: 3.65 MIL/uL — AB (ref 3.87–5.11)
WBC: 9 10*3/uL (ref 4.0–10.5)

## 2018-02-11 LAB — CULTURE, BLOOD (ROUTINE X 2): Culture: NO GROWTH

## 2018-02-11 LAB — GLUCOSE, CAPILLARY
GLUCOSE-CAPILLARY: 126 mg/dL — AB (ref 70–99)
GLUCOSE-CAPILLARY: 98 mg/dL (ref 70–99)
Glucose-Capillary: 92 mg/dL (ref 70–99)
Glucose-Capillary: 110 mg/dL — ABNORMAL HIGH (ref 70–99)
Glucose-Capillary: 126 mg/dL — ABNORMAL HIGH (ref 70–99)
Glucose-Capillary: 92 mg/dL (ref 70–99)

## 2018-02-11 MED ORDER — IOPAMIDOL (ISOVUE-300) INJECTION 61%
INTRAVENOUS | Status: AC
  Administered 2018-02-11: 10:00:00
  Filled 2018-02-11: qty 30

## 2018-02-11 MED ORDER — METOPROLOL TARTRATE 5 MG/5ML IV SOLN
5.0000 mg | Freq: Four times a day (QID) | INTRAVENOUS | Status: DC
  Administered 2018-02-11 – 2018-02-18 (×28): 5 mg via INTRAVENOUS
  Filled 2018-02-11 (×29): qty 5

## 2018-02-11 MED ORDER — CITALOPRAM HYDROBROMIDE 10 MG/5ML PO SOLN
20.0000 mg | Freq: Every day | ORAL | Status: DC
  Administered 2018-02-11 – 2018-02-24 (×14): 20 mg
  Filled 2018-02-11 (×14): qty 10

## 2018-02-11 MED ORDER — VITAL AF 1.2 CAL PO LIQD
1000.0000 mL | ORAL | Status: DC
  Administered 2018-02-11 – 2018-02-21 (×9): 1000 mL

## 2018-02-11 MED ORDER — IOHEXOL 300 MG/ML  SOLN
100.0000 mL | Freq: Once | INTRAMUSCULAR | Status: AC | PRN
  Administered 2018-02-11: 100 mL via INTRAVENOUS

## 2018-02-11 MED ORDER — IOPAMIDOL (ISOVUE-300) INJECTION 61%: 15.0000 mL | Freq: Once | INTRAVENOUS | Status: DC | PRN

## 2018-02-11 MED ORDER — QUETIAPINE FUMARATE 50 MG PO TABS
25.0000 mg | ORAL_TABLET | Freq: Every day | ORAL | Status: DC
  Administered 2018-02-11 – 2018-02-24 (×13): 25 mg
  Filled 2018-02-11 (×14): qty 1

## 2018-02-11 MED ORDER — QUETIAPINE FUMARATE 50 MG PO TABS
50.0000 mg | ORAL_TABLET | Freq: Every day | ORAL | Status: DC
  Administered 2018-02-11 – 2018-02-23 (×12): 50 mg via ORAL
  Filled 2018-02-11 (×12): qty 1

## 2018-02-11 MED ORDER — SODIUM CHLORIDE 0.9 % IV SOLN
INTRAVENOUS | Status: DC | PRN
  Administered 2018-02-11: 250 mL via INTRAVENOUS
  Administered 2018-02-13: 500 mL via INTRAVENOUS
  Administered 2018-02-14: 100 mL via INTRAVENOUS
  Administered 2018-02-21 – 2018-03-02 (×4): 250 mL via INTRAVENOUS

## 2018-02-11 NOTE — Care Management Note (Signed)
Case Management Note  Patient Details  Name: Jo Parker MRN: [id number] Date of Birth: 1945-08-17  Subjective/Objective:                  Remains vent supported at 30%/  Action/Plan: Following for cm needs  Expected Discharge Date:  01/29/18               Expected Discharge Plan:  Home/Self Care  In-House Referral:     Discharge planning Services  CM Consult  Post Acute Care Choice:    Choice offered to:     DME Arranged:    DME Agency:     HH Arranged:    HH Agency:     Status of Service:  In process, will continue to follow  If discussed at Long Length of Stay Meetings, dates discussed:    Additional Comments:  Leeroy Cha, RN 02/11/2018, 12:16 PM

## 2018-02-11 NOTE — Progress Notes (Addendum)
CT results discussed with interventional radiology Dr. Jacqualyn Posey.  Fluid collections we have been monitoring have actually decreased in size.  This puts Jo Parker in a challenging situation as there is nothing really at this point we should be trying to drain,  however it is clear that this will be a prolonged hospitalization & we haven't really seen any significant improvement.  We should consider tracheostomy at this point in her illness  and I have encouraged the family to discuss this further.    I did remind them the simple fact that we were discussing tracheostomy is significant because it indicates just how sick she is.  This is all in the setting of a underlying malignancy which we are not treating currently, and with her current nutritional status, infectious status, and deconditioning it seems unlikely that she would be a candidate for further therapy for several weeks to come in the best case scenario or ever in the more likely scenario.  We talked at length about tracheostomy.  The benefits being comfort, preservation of the vocal cords, allowing for more aggressive ventilator weaning and of course giving Jo Parker more time.  However placement of tracheostomy would not improve her overall status or improve likelihood for recovery.  At best it gives Jo Parker more time.  We explored the very real possibility that should we decide on tracheostomy, and she were to improve to the point where she was liberated from the ventilator, eventually moved out of the intensive care, perhaps the best we could offer would be to get her home, likely still to die of her malignancy.    I have strongly encouraged the family to consider DO NOT RESUSCITATE status, and furthermore have encouraged them to consider no escalation of care should [name] take a turn for the worse in the next few days.  They seem open to this but 1 of the brothers was not present, and they would like to discuss this further as a family.  Plan Full code for  now, but anticipate transition to DO NOT RESUSCITATE with no further escalation of care pending further family discussions. Family discussing tracheostomy versus alternatively transition to comfort We will continue current aggressive care for now  35 minutes  Erick Colace ACNP-BC [place] Pager # [phone] OR # [phone] if no answer

## 2018-02-11 NOTE — Progress Notes (Addendum)
Gynecologic Oncology Progress Note  72 year old female diagnosed with Stage IV ovarian cancer who received four cycles of neoadjuvant chemotherapy with carboplatin and taxol with fourth cycle completed on 12/20/17 .  She underwent interval debulking surgery with Dr. Skeet Latch on 01/27/18 consisting of an exploratory laparotomy infra-gastric omentectomy bilateral salpingo-oophorectomy radical optimal tumor debulking, resection of anterior abdominal wall.  Her post-operative course was complicated by altered mental status noted on POD1 with improvement after Narcan administration but with return of symptoms, acute renal injury, tachycardia, dyspnea (CT chest performed 01/30/18-neg for PE), anemia.  Her AKI and altered mental status appeared to improve on 01/30/18 in the am but symptoms worsened the evening of 01/30/18 with episodes of emesis, tachycardia, hypertension, and confusion. A CT scan on 01/31/18 revealed findings most consistent with ischemic bowel with portal venous gas and bowel pneumatosis and large amount of pneumoperitoneum with concerns for peritonitis.  She was taken back to the OR on 01/31/18 with operative findings of free air and large volume ascites/fluid, but unable to visualize site of bowel perforation, no evidence of ischemia. It was felt that aspiration most likely occurred during intubation.        Subjective: Patient intubated. [name]. When asked about events last pm, son states "just another night." No concerns voiced.  Objective: Vital signs in last 24 hours: Temp:  [99.5 F (37.5 C)-100.6 F (38.1 C)] 99.9 F (37.7 C) (09/06 0900) Pulse Rate:  [88-134] 118 (09/06 0900) Resp:  [18-51] 28 (09/06 0900) BP: (105-173)/(30-64) 157/64 (09/06 0900) SpO2:  [97 %-100 %] 99 % (09/06 0900) FiO2 (%):  [30 %] 30 % (09/06 0843) Weight:  [132 lb 11.5 oz (60.2 kg)] 132 lb 11.5 oz (60.2 kg) (09/06 0600) Last BM Date: 02/11/18(Flexi seal)  Intake/Output from previous day: 09/05  0701 - 09/06 0700 In: 3117.8 [I.V.:1271.1; NG/GT:1080; IV Piggyback:721.7] Out: 3050 [Urine:2850; Stool:200]  Physical Examination: General: appears comfortable, does not respond to voice or vigorous stimulus. Resp: coarse breath sounds bilaterally Cardio: heart rate at 118, regular rhythm GI: abdomen slightly distended, active active bowel sounds. Flexiseal in place with liquid brown stool. Midline incision with steri strips on upper portion intact.  Superficial opening under the umbilicus unchanged from 02/10/18 assessment.  Not deep enough for packing that this time and does not appear Extremities: generalized edema in all extrem improved since last assessment JP drain x3 from the abdomen located in the pelvis charged with minimal serous fluid.  Foley with clear, yellow colored urine   Labs: WBC/Hgb/Hct/Plts:  9.0/9.3/30.2/203 (09/06 0430)    Assessment: 72 y.o. s/p Procedure(s): Interval debulking surgery with Dr. Skeet Latch on 01/27/18 consisting of an exploratory laparotomy infra-gastric omentectomy bilateral salpingo-oophorectomy radical optimal tumor debulking, resection of anterior abdominal wall.  Reoperation of EXPLORATORY LAPAROTOMY, ABDOMINAL WASHOUT on 01/31/18: critically ill  Heme: Post-operative anemia. Hgb 9.0 this am which is stable. Last transfusion 02/02/18.  Anemia related to acute critical illness, no evidence of active bleeding.   ID: Fever improving but low grade temps remain with max temp of 100.4 on 02/11/18. Presumed sepsis initially from gastrointestinal source but unable to ID the perforation after exp lap on 01/31/18. Contribution now from pneumonia and undrained left pelvic fluid collection seen on CT scan 02/07/18. WBC mildly elevated at 14.2 this am. Granix received 02/01/18 per Dr. Alvy Bimler.   Vancomycin stopped on 02/04/18 and then fevers began again. Restarted Vancomycin on 02/06/18. Anidulafungin initiated 02/01/18.  Meropenum IV started 01/31/18.  Blood cultures from  01/31/18 & 02/06/18 with no growth.  Tracheal aspirate from 02/01/18 resulting few gram positive rods. Repeated 02/06/18 showed few yeast. JP drain culture from 02/05/18 no growth to date. IR feels no safe window for drainage of pelvic fluid collection.  Repeat imaging today. Appreciate ID input (no changes to regimen recommended at this time).  Wound culture send from abdominal wound opening. Continue current IV antibiotic regimen.  CV: Mild tachycardia intermittently. BP sensitive with changes in sedation. Telemetry, diuresis per CC, lopressor, and labetalol ordered PRN.  Resp: ARDS in setting of aspiration pneumonia and sepsis.   Chest 1 view  9/6 in afternoon: IMPRESSION: 1. No significant change compared to the recent chest x-rays. Persistent opacities throughout the RIGHT lung, consistent with multifocal pneumonia versus asymmetric pulmonary edema. Small bilateral pleural effusions. 2. Tubes and lines appear adequately positioned. Weaning per CC.   GI:  Tolerating po: No: intubated. Loose bowel movements, Flexiseal in place, suggestive of return of bowel function. Protonix ordered. GI consultation appreciated. CT AP on 01/31/18 with findings consistent with GI perforation.  No obvious perforation identified on re-operation on 01/31/18. TPN initiated 02/02/18 and discontinued 02/10/18. Tube feedings running continuously at 50cc/hr.    GU: AKI post-op resolved. Creatinine 0.68 02/10/18. Awaiting Cmet results. Foley in place with yellow urine. 2850 cc of urine output on 02/10/18 s/p diuresis.   FEN: Cmet pending for this am. Replacement if needed per CC.   Prophylaxis: SCDs in place. Continue Lovenox prophylaxis. No active bleeding and H&H stable.  Plan: Continue care per Critical Care.  Continue with wet to dry dressing changes to the opening in the lower aspect of the incision twice daily. If the incision opens further, consideration for negative pressure wound therapy.   Continue IV antibiotic  regimen at this time. Repeat CT today to re-evaluate fluid collection for possible reconsideration for drainage.  Continue tube feeds.  Jo Parker 02/11/2018, 10:56 AM  Patient seen and examined at noon. Planning CT today. Critical care discussing possibility of trach with family today. Continuing tube feeds and pelvic JP drain for now.

## 2018-02-11 NOTE — Progress Notes (Signed)
Attempted to titrate sedation during the night. If Fentanyl was given Pt appears comfortable, if not Pt begins to have short rapid breathing and the vent alarms and HR increases.

## 2018-02-11 NOTE — Progress Notes (Signed)
Pt transported to CT and back on 100% with ventilator.

## 2018-02-11 NOTE — Progress Notes (Signed)
Patient ID: Jo Parker, female   DOB: [birth year], 72 y.o.   MRN: [id number]         [facility]  Date of Admission:  01/27/2018           Day 12 meropenem        Day 11 vancomycin        Day 11 anidulafungin ASSESSMENT: Her fever is resolving and her white blood cell count is back to normal but her overall condition remains very tenuous.  Her overall prognosis remains very poor given her age, metastatic cancer, recent chemotherapy and major abdominal surgery complicated by intra-abdominal abscesses, wound infection and pneumonia.  PLAN: 1. Continue current antimicrobial therapy 2. Await results of repeat abdominal and pelvic CT scan 3. I have reviewed the situation with her son  Active Problems:   Severe sepsis ([place])   Peritonitis ([place])   HCAP (healthcare-associated pneumonia)   Postoperative wound infection   Primary peritoneal carcinomatosis ([place])   Ovarian cancer ([place])   Status post bilateral salpingo-oophorectomy   Severe protein-calorie malnutrition ([name])   COPD (chronic obstructive pulmonary disease) ([name])   HLD (hyperlipidemia)   Absolute anemia   White coat syndrome with hypertension   AKI (acute kidney injury) ([place])   Acute metabolic encephalopathy   Acute respiratory distress   Low urine output   Somnolence   At risk for fluid volume overload   Free intraperitoneal air   Acute respiratory failure ([name])   Pressure injury of skin   Scheduled Meds: . chlorhexidine gluconate (MEDLINE KIT)  15 mL Mouth Rinse BID  . citalopram  20 mg Per Tube Daily  . enoxaparin (LOVENOX) injection  40 mg Subcutaneous Q24H  . free water  100 mL Per Tube Q8H  . furosemide  40 mg Intravenous Q12H  . insulin aspart  0-20 Units Subcutaneous Q4H  . insulin aspart  4 Units Subcutaneous Q4H  . iopamidol      . ipratropium-albuterol  3 mL Nebulization TID  . mouth rinse  15 mL Mouth Rinse 10 times per day  . metoprolol tartrate  5 mg Intravenous Q6H    . pantoprazole (PROTONIX) IV  40 mg Intravenous Q12H  . QUEtiapine  25 mg Per Tube Daily  . QUEtiapine  50 mg Oral QHS  . sodium chloride flush  10-40 mL Intracatheter Q12H   Continuous Infusions: . anidulafungin Stopped (02/10/18 1234)  . dextrose 10 mL/hr at 02/11/18 0800  . feeding supplement (VITAL AF 1.2 CAL) 40 mL/hr at 02/10/18 0500  . meropenem (MERREM) IV Stopped (02/11/18 5498)  . vancomycin Stopped (02/10/18 1314)   PRN Meds:.acetaminophen (TYLENOL) oral liquid 160 mg/5 mL, fentaNYL (SUBLIMAZE) injection, hydrALAZINE, iohexol, iopamidol, labetalol, sodium chloride flush  Review of Systems: Review of Systems  Unable to perform ROS: Intubated    Allergies  Allergen Reactions  . Oxycodone     confusion    OBJECTIVE: Vitals:   02/11/18 0700 02/11/18 0800 02/11/18 0843 02/11/18 0900  BP: (!) 139/44 137/62  (!) 157/64  Pulse: (!) 106 (!) 108 (!) 114 (!) 118  Resp: 18 (!) 25 (!) 28 (!) 28  Temp: 99.7 F (37.6 C) 99.9 F (37.7 C)  99.9 F (37.7 C)  TempSrc:  Core    SpO2: 98% 97% 99% 99%  Weight:      Height:       Body mass index is 23.51 kg/m.  Physical Exam  Constitutional:  She remains sedated on the ventilator.  Cardiovascular: Normal rate, regular rhythm and normal heart sounds.  No murmur heard. Pulmonary/Chest: She has no wheezes. She has no rales.  Abdominal: Soft. She exhibits no distension.  Her nurse reported to me that there was no purulent drainage when her abdominal wound dressing was changed this morning.  Skin: No rash noted.  IV sites look okay.    Lab Results Lab Results  Component Value Date   WBC 9.0 02/11/2018   HGB 9.3 (L) 02/11/2018   HCT 30.2 (L) 02/11/2018   MCV 82.7 02/11/2018   PLT 203 02/11/2018    Lab Results  Component Value Date   CREATININE 0.68 02/10/2018   BUN 38 (H) 02/10/2018   NA 141 02/10/2018   K 4.0 02/10/2018   CL 91 (L) 02/10/2018   CO2 39 (H) 02/10/2018    Lab Results  Component Value Date    ALT 80 (H) 02/10/2018   AST 98 (H) 02/10/2018   ALKPHOS 218 (H) 02/10/2018   BILITOT 0.4 02/10/2018     Microbiology: Recent Results (from the past 240 hour(s))  Body fluid culture     Status: None   Collection Time: 02/05/18 11:54 AM  Result Value Ref Range Status   Specimen Description   Final    PERITONEAL INTRAPERITONEAL DRAIN 3 Performed at [facility] [address] [place], [place] [address]    Special Requests   Final    NONE Performed at [facility], [place] [address] [place], [place] [address]    Gram Stain   Final    RARE WBC PRESENT, PREDOMINANTLY PMN NO ORGANISMS SEEN    Culture   Final    NO GROWTH 3 DAYS Performed at [facility] [address], [place], [place] [address]    Report Status 02/08/2018 FINAL  Final  Culture, blood (routine x 2)     Status: None (Preliminary result)   Collection Time: 02/06/18  2:42 AM  Result Value Ref Range Status   Specimen Description   Final    BLOOD BLOOD RIGHT HAND Performed at [facility] [address], [place], [place] [address]    Special Requests   Final    BOTTLES DRAWN AEROBIC ONLY Blood Culture results may not be optimal due to an inadequate volume of blood received in culture bottles Performed at [facility] [address], [place], [place] [address]    Culture   Final    NO GROWTH 4 DAYS Performed at [facility], [place] [address] [place], [place] [address]    Report Status PENDING  Incomplete  Urine Culture     Status: None   Collection Time: 02/06/18 10:40 AM  Result Value Ref Range Status   Specimen Description   Final    urine Performed at [facility], [place] [address] [place], [place] [address]    Special Requests   Final    NONE Performed at [facility], [place] [address], [place], [place] [address]    Culture   Final    NO GROWTH Performed at [facility], [place] [address], [place],  [address]    Report Status 02/07/2018 FINAL  Final  Culture, respiratory (non-expectorated)     Status: None   Collection Time: 02/06/18 11:44 AM  Result Value Ref Range Status   Specimen Description   Final    TRACHEAL ASPIRATE TRACHEAL ASPIRATE Performed at [facility],  [place] [address] [place], [place] [address]    Special Requests   Final    NONE Performed at [facility], [place] [address] [place], [place] [address]    Gram Stain   Final    FEW WBC PRESENT, PREDOMINANTLY PMN RARE YEAST Performed at [facility], [place] [address], [place], [place] [address]    Culture FEW CANDIDA ALBICANS FEW CANDIDA GLABRATA   Final   Report Status 02/09/2018 FINAL  Final  Aerobic Culture (superficial specimen)     Status: None (Preliminary result)   Collection Time: 02/10/18 10:56 AM  Result Value Ref Range Status   Specimen Description   Final    ABDOMEN Performed at [facility] [address], [place], [place] [address]    Special Requests   Final    NONE Performed at [facility], [place] [address] [place], [place] [address]    Gram Stain   Final    RARE WBC PRESENT, PREDOMINANTLY PMN NO ORGANISMS SEEN Performed at [facility], [place] [address] [place], [place] [address]    Culture PENDING  Incomplete   Report Status PENDING  Incomplete    Michel Bickers, MD [facility] [phone] pager   [phone] cell 02/11/2018, 10:04 AM

## 2018-02-11 NOTE — Progress Notes (Signed)
Nutrition Follow-up  DOCUMENTATION CODES:   Not applicable  INTERVENTION:  - Continue Vital AF 1.2 @ 50 mL/hr with 100 mL free water TID.    NUTRITION DIAGNOSIS:   Increased nutrient needs related to post-op healing as evidenced by estimated needs. -ongoing  GOAL:   Patient will meet greater than or equal to 90% of their needs -met with nutrition support  MONITOR:   Vent status, TF tolerance, Weight trends, Labs, Skin  ASSESSMENT:   Patient with PMH significant for COPD< HLD, HTN, mitral valve prolapse, and Stage IV ovarian cancer s/p neoadjuvant chemotherapy . Presents this admission for robotic assisted bilateral salpingo-oophorectomy omentectomy interval debulking.   Significant Events: 8/22:ex lap with infra-gastric omentectomy bilateral salpingo-oophorectomy radical optimal tumor debulking, resection of anterior abdominal wall 8/24:remained somnolent and was confused, noted to have post-op UTI 8/26:remained confused and having chest pain; transferred to SDU for Cardene drip; x-ray showed free air and CT abd concerning for ischemic bowel, bowel pneumatosis, and large amount of pneumoperitoneum; repeat ex lap with washout. 8/27:triple lumen PICC placed. 8/28:TPN initiated; ILE hold x7 days; received Granix per Oncology. 9/4: trickle TF started via NGT. 9/5: TPN weaned off. 9/6: TF rate advanced to 50 mL/hr.   Patient remains intubated, sedated, with NGT in place. She is currently receiving Vital AF 1.2 @ 50 mL/hr with 100 mL free water TID. This regimen is providing 1440 kcal (97% re-estimated kcal need), 90 grams of protein, and 1273 mL free water.  Per Pete's note this AM: sepsis and fever with neutropenia, plan for repeat CT abd/pelvis today, ongoing R > L airspace disease, delirium remains a barrier to vent weaning, continue diuresis as patient +5.5L, anasarca, possible need for discussion about/placement of tracheostomy, poor prognosis.    Patient is currently  intubated on ventilator support MV: 10 L/min Temp (24hrs), Avg:100 F (37.8 C), Min:99.5 F (37.5 C), Max:100.6 F (38.1 C) BP: 115/48 and MAP: 69  Medications reviewed; 40 mg IV Lasix BID, sliding scale Novolog, 4 units Novolog every 4 hours, 40 mg IV Protonix BID.  Labs reviewed; CBGs: 126 and 92 mg/dL today, triglycerides on 9/2: 39 mg/dL, BMP on 9/5: Cl: 91 mg/dL, BUN: 38 mg/dL, Ca: 8 mg/dL, Phos: 5.2 mg/dL, LFTs elevated.  IVF; D5 @ 10 mL/hr (41 kcal). Drip; Precedex @ 0.5 mcg/kg/hr.    Diet Order:   Diet Order            Diet NPO time specified  Diet effective now              EDUCATION NEEDS:   Education needs have been addressed  Skin:  Skin Assessment: Skin Integrity Issues: Skin Integrity Issues:: Unstageable Unstageable: full thickness to R ear Incisions: abdominal (8/26)  Last BM:  9/6 via flexiseal  Height:   Ht Readings from Last 1 Encounters:  01/31/18 '5\' 3"'$  (1.6 m)    Weight:   Wt Readings from Last 1 Encounters:  02/11/18 60.2 kg    Ideal Body Weight:  52.3 kg  BMI:  Body mass index is 23.51 kg/m.  Estimated Nutritional Needs:   Kcal:  1485  Protein:  87-99 grams (1.5-1.7 grams/kg)  Fluid:  >/= 1.5 L/day     Jarome Matin, MS, RD, LDN, [facility] Inpatient Clinical Dietitian Pager # [phone] After hours/weekend pager # [phone]

## 2018-02-11 NOTE — Progress Notes (Addendum)
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  She is completed 3 cycles of chemotherapy and is now status post bilateral salpingo-oophorectomy and omentectomy with debulking on 8/22.  8/26 transferred to ICU w/ concern for perforation w/ free air noted.  Imipenem started. Went for exploratory laparotomy with washout.  No significant contamination identified, presumed spontaneously closed microperf. Returned with abdominal drains postoperative chest x-ray showing evidence of aspiration pneumonia; mixed respiratory and metabolic acidosis & in septic shock.  8/27: weaning pressors. Hgb dropped from 8.8 to 7.4; got 2 units of blood,  febrile, added. vancomycin.  Neupogen given for neutropenia.  8/28: Pressors off/shock resolved. NAG acidosis and Lactic acidosis resolved.  FiO2/PEEP down, attempted PSV.  WBC count up.  Started TPN.  8/29: FiO2 and PEEP requirements increased, had wean these down to 40/5 now 60/10.  Chest x-ray with worsening right-sided airspace disease likely reflecting de-recruitment. Delirium remains significant obstacle.  Beta-blocker, and clonidine patch added for hypertension  8/30: Diuresed 4.5 L the day prior with Lasix.  Little more awake.  X-ray improved.  Vancomycin discontinued.  Continuing diuresis, RASS goal changed to -1, dropping PEEP to 8. 9/01:  Vanco added back with fevers, clonidine patch 9/02-9/6: Delirium persists. Sedation difficult, diuresing, remains on abx. spiking temps, loculated fluid collection identified in left pelvis. Not amedable to drainage.  ID consulted. 9/4: tmax 101.5 (on 9/3). Starting tubefeeds, weaning TNA.  Started seroquel. 9/4. Upper 2 drains removed. Staples removed.from incision site.9/6: wbc curve  normalized but still having low gd fever and purulent drainage from wound. Repeat abd/pelvis CT ordered.   Subjective/interval events  Heavily sedated.  Appears comfortable   Resolved hospital Problems  Septic shock 8/28 AKI Mild anion gap metabolic acidosis   Assessment & Plan:   Sepsis, fever with neutropenia: peritonitis (microperforation), complicated by aspiration pneumonia.  -ongoing fevers, 9/2 CT Abd concerning for loculated fluid collection->IR felt too risky for drainage anatomically  Plan Day #12 vancomycin and meropenem day #10 Eraxis  Repeating CT abdomen pelvis today  Status post exploratory laparotomy for free air on 8/26 ; following bilateral salpingo-oophorectomy, omentectomy, and debulking surgery on 8/22 Plan Continuing tube feeds Continuing wound care May need VAC drainage  Acute hypoxic respiratory failure in the setting of aspiration pneumonia/ARDS Right pleural effusion  Portable chest x-ray personally reviewed: Persistent right greater than left airspace disease.  Appears to be element of worsening effusion.  Of note ultrasound evaluation on 9/5 did show some fluid but not large amount.  This looks as though it may have increased in volume Delirium continues to be her major barrier to weaning, although I also suspect deconditioning and malnutrition are contributing factors as well Plan Continue to address sedation, PAD goal RASS 0; this may be difficult to achieve if she is still infected Continue ongoing diuresis (she is still 5.5 L positive) Pressure support as tolerated, Will see what CT results show, I think we are going to have to discuss tracheostomy  Sinus tachycardia, hypertension, and grade 1 diastolic dysfunction with minimal troponin I bump.   Bradycardia - periods of brady to the 30's Labile BP (w/episodes of hypotension seemingly exacerbated by sedation) -Echo showed hyperdynamic ejection fraction with no wall motion  abnormality Plan Lopressor with hold parameters PRN labetalol  Continue diuresis Continue telemetry monitoring   Acute metabolic encephalopathy.   -Suspect this is multifactorial and secondary to sepsis as well as residual narcotics and metabolic acidosis, this remains a significant barrier.  Plan RASS goal 0 Adjusting Seroquel dosing, added back Celexa, Stopping Precedex  At risk for fluid and electrolyte imbalance: Anasarca Plan Trend BMP  Severe Protein Calorie Malnutrition, prealbumin 3.5 Plan Continue tube feeds  Postoperative anemia.  Hemoglobin stable Plan Trend CBC continue PPI twice daily  Leukocytosis.  Initially was neutropenic however did receive Granix on 8/28 per oncology; has normalized once again as of 9/6 Plan Continue to trend CBC  Thrombocytopenia; stable; improved Plan Trend CBC  Hyperglycemia Plan Continue current sliding scale and NovoLog coverage  Stage IV ovarian cancer, now status post bilateral salpingo-oophorectomy omentectomy and debulking surgery on 8/22 Plan Per GYN oncology  Best practice/Goals of care/disposition.   DVT prophylaxis: LMWH GI prophylaxis: Changed to PPI on 8/27 Diet: NPO, starting TPN on 8/28 Mobility: BR Code Status: Full Code Family Communication:  Sons x2 updated at bedside am 9/3.    Disposition / Summary of Today's Plan 02/11/18   Stop Precedex, adjusting Seroquel for daytime and at bedtime dosing.  We will continue to treat breakthrough hypertension with labetalol, and continue scheduled Lopressor with hold orders as well as continuing aggressive diuresis.  Ordering CT abdomen pelvis today.  My biggest concern at this point in his prognosis in general.  We still have a 72 year old female with an ongoing malignancy.  She was scheduled to get 3 more doses of chemotherapy which is likely indefinitely on hold given her current poor functional status.  We are nearing the timeframe where we need to consider  tracheostomy but I am not sure that this will add much in the way of quality, ultimately I think her prognosis is quite poor  Consultants:  Internal medicine 8/25 Critical care 8/26 General surgery 8/26 GI medicine 8/27 > did not feel she had overt GIB.  Rec's prophylaxis with PPI.  Signed off. Interventional radiology cons: 9/3 felt left fluid collection was anatomically too risky to drain 9/4: Infectious disease.  Consulted by family.  Had no additional recommendations   procedures: 8/22 bilateral salpingo-nephrectomy omentectomy and debulking surgery  8/26 Intubation 8/26 Exploratory laparotomy, washout, placement of right upper quadrant x2 JP drain and left lower quadrant JP drain  Significant Diagnostic Tests: CT abdomen 8/26: Hemic bowel with portal venous gas and bowel pneumatosis with large amount of pneumoperitoneum.  Unclear as to site of perforation.  Multiple pockets of air adjacent to the sigmoid colon.  Enhancement of the peritoneum concerning for peritonitis.  There was aortic arterial sclerosis.  Echocardiogram 8/27: Particular ejection fraction 65 to 70%.  She has moderate LVH, and hyperdynamic LV function.  There is grade 1 diastolic dysfunction.  Trivial MR.  Normal left atrial size.  The IV C was not visualized  CT abdomen 9/2 >> drainage catheters within the pelvis, RUQ, LUQ.  Significant interval decrease in size of previously described intra-abdominal fluid, no residual free intraperitoneal air. There is a persistent loculated fluid & gas collection along the left pelvic sidewall measuring up to 10 cm.  Moderate bilateral pleural effusions, underlying opacities favored to represent atelectasis.  Marked wall thickening of the sigmoid colon.  Peripheral low attenuation within the spleen concerning for splenic infarcts.  Wall thickening of the urinary bladder.  Repeat CT abdomen 9/6:>>>  Micro Data: Blood Culture x2 8/26 >>neg Sputum 8/27: Abundant yeast, few Candida  albicans Blood Cx 9/1 >>  Respiratory 9/1 >> few Candida Urine 9/1 >> negative Peritoneal fluid 8/31 >> neg  Antimicrobials:  Meropenem 8/26 >> Vancomycin 8/27 >> 8/30; 9/1 >>  Eraxis 8/27 >>   Objective    Examination:  General: Chronically ill, malnourished, 72 year old female remains minimally responsive on the ventilator HEENT normocephalic atraumatic still has some scleral edema.  Orally intubated Neuro: Heavily sedated Pulmonary: Diminished bases, marked accessory use during attempted pressure support ventilation Cardiac: Tachycardic regular rate and rhythm Abdomen: Dressing intact positive bowel sounds liquid stool from Flexi-Seal Extremities: Decreasing anasarca, warm, dry, strong pulses GU: Clear yellow   Blood pressure (Abnormal) 141/47, pulse (Abnormal) 114, temperature 100 F (37.8 C), resp. rate (Abnormal) 28, height 5\' 3"  (1.6 m), weight 60.2 kg, SpO2 99 %.    Vent Mode: PRVC FiO2 (%):  [30 %] 30 % Set Rate:  [20 bmp] 20 bmp Vt Set:  [330 mL] 330 mL PEEP:  [5 cmH20] 5 cmH20 Plateau Pressure:  [14 cmH20-22 cmH20] 20 cmH20   Intake/Output Summary (Last 24 hours) at 02/11/2018 0858 Last data filed at 02/11/2018 0622 Gross per 24 hour  Intake 3117.77 ml  Output 3050 ml  Net 67.77 ml   Filed Weights   02/08/18 0600 02/10/18 0400 02/11/18 0600  Weight: 60.3 kg 62.2 kg 60.2 kg     Labs   CBC: Recent Labs  Lab 02/07/18 0327 02/08/18 0514 02/09/18 0400 02/10/18 0302 02/11/18 0430  WBC 14.8* 15.1* 14.2* 9.9 9.0  NEUTROABS 12.8* 12.8* 12.4* 8.4* 6.8  HGB 9.7* 10.7* 10.6* 9.1* 9.3*  HCT 30.8* 33.9* 34.4* 29.7* 30.2*  MCV 83.0 81.5 82.9 83.4 82.7  PLT 92* 160 164 146* 818   Basic Metabolic Panel: Recent Labs  Lab 02/05/18 0518  02/06/18 1037 02/07/18 0327 02/07/18 1740 02/08/18 0514 02/09/18 0400 02/10/18 0302  NA  --    < > 146* 145 138 137 140 141  K  --    < > 3.2* 3.9 4.0 4.0 4.4 4.0  CL  --    < > 102 99 86* 86* 90* 91*  CO2  --    < >  35* 38* 39* 37* 37* 39*  GLUCOSE  --    < > 247* 90 149* 142* 182* 222*  BUN  --    < > 24* 27* 29* 31* 32* 38*  CREATININE  --    < > 0.45 0.50 0.61 0.64 0.60 0.68  CALCIUM  --    < > 6.4* 7.0* 7.7* 7.9* 7.9* 8.0*  MG 1.6*   < > 1.5* 1.7 2.1 1.9  --  1.8  PHOS 3.9  --  2.2* 4.0  --   --   --  5.2*   < > = values in this interval not displayed.   GFR: Estimated Creatinine Clearance: 52.6 mL/min (by C-G formula based on SCr of 0.68 mg/dL). Recent Labs  Lab 02/06/18 1010  02/07/18 0342 02/08/18 0514 02/09/18 0400 02/10/18 0302 02/11/18 0430  WBC  --    < >  --  15.1* 14.2* 9.9 9.0  LATICACIDVEN 1.3  --  0.8  --   --   --   --    < > = values in this interval not displayed.   Liver Function Tests: Recent Labs  Lab 02/07/18 0327 02/10/18 0302  AST 39 98*  ALT 30 80*  ALKPHOS 100 218*  BILITOT 0.6 0.4  PROT 4.9* 6.0*  ALBUMIN 1.3* 1.5*  No results for input(s): LIPASE, AMYLASE in the last 168 hours. No results for input(s): AMMONIA in the last 168 hours. ABG    Component Value Date/Time   PHART 7.330 (L) 02/08/2018 1018   PCO2ART 81.6 (HH) 02/08/2018 1018   PO2ART 79.9 (L) 02/08/2018 1018   HCO3 41.9 (H) 02/08/2018 1018   TCO2 17 (L) 01/31/2018 1728   ACIDBASEDEF 0.4 02/01/2018 0432   O2SAT 94.1 02/08/2018 1018    Coagulation Profile: No results for input(s): INR, PROTIME in the last 168 hours.   Cardiac Enzymes: Recent Labs  Lab 02/07/18 1740 02/07/18 2314 02/08/18 0514  TROPONINI 0.05* 0.04* 0.05*   HbA1C: No results found for: HGBA1C   CBG: Recent Labs  Lab 02/10/18 1516 02/10/18 1936 02/10/18 2337 02/11/18 0322 02/11/18 0731  GLUCAP 119* 89 127* 126* 54  Erick Colace ACNP-BC [place] Pager # [phone] OR # [phone] if no answer

## 2018-02-12 DIAGNOSIS — J690 Pneumonitis due to inhalation of food and vomit: Secondary | ICD-10-CM

## 2018-02-12 DIAGNOSIS — R509 Fever, unspecified: Secondary | ICD-10-CM

## 2018-02-12 LAB — CBC WITH DIFFERENTIAL/PLATELET
BASOS ABS: 0 10*3/uL (ref 0.0–0.1)
Basophils Relative: 0 %
EOS ABS: 0.1 10*3/uL (ref 0.0–0.7)
Eosinophils Relative: 1 %
HEMATOCRIT: 31.6 % — AB (ref 36.0–46.0)
HEMOGLOBIN: 9.8 g/dL — AB (ref 12.0–15.0)
LYMPHS PCT: 14 %
Lymphs Abs: 1.3 10*3/uL (ref 0.7–4.0)
MCH: 25.6 pg — ABNORMAL LOW (ref 26.0–34.0)
MCHC: 31 g/dL (ref 30.0–36.0)
MCV: 82.5 fL (ref 78.0–100.0)
MONOS PCT: 10 %
Monocytes Absolute: 0.9 10*3/uL (ref 0.1–1.0)
NEUTROS ABS: 7 10*3/uL (ref 1.7–7.7)
NEUTROS PCT: 75 %
Platelets: 278 10*3/uL (ref 150–400)
RBC: 3.83 MIL/uL — AB (ref 3.87–5.11)
RDW: 24.3 % — ABNORMAL HIGH (ref 11.5–15.5)
WBC: 9.3 10*3/uL (ref 4.0–10.5)

## 2018-02-12 LAB — COMPREHENSIVE METABOLIC PANEL
ALBUMIN: 1.6 g/dL — AB (ref 3.5–5.0)
ALT: 69 U/L — ABNORMAL HIGH (ref 0–44)
ANION GAP: 12 (ref 5–15)
AST: 60 U/L — AB (ref 15–41)
Alkaline Phosphatase: 214 U/L — ABNORMAL HIGH (ref 38–126)
BUN: 30 mg/dL — AB (ref 8–23)
CO2: 41 mmol/L — ABNORMAL HIGH (ref 22–32)
Calcium: 8.4 mg/dL — ABNORMAL LOW (ref 8.9–10.3)
Chloride: 91 mmol/L — ABNORMAL LOW (ref 98–111)
Creatinine, Ser: 0.64 mg/dL (ref 0.44–1.00)
GFR calc Af Amer: >60 mL/min (ref >60)
GLUCOSE: 142 mg/dL — AB (ref 70–99)
POTASSIUM: 3.3 mmol/L — AB (ref 3.5–5.1)
SODIUM: 144 mmol/L (ref 135–145)
Total Bilirubin: 0.7 mg/dL (ref 0.3–1.2)
Total Protein: 6.8 g/dL (ref 6.5–8.1)

## 2018-02-12 LAB — GLUCOSE, CAPILLARY
GLUCOSE-CAPILLARY: 154 mg/dL — AB (ref 70–99)
GLUCOSE-CAPILLARY: 133 mg/dL — AB (ref 70–99)
GLUCOSE-CAPILLARY: 91 mg/dL (ref 70–99)
Glucose-Capillary: 122 mg/dL — ABNORMAL HIGH (ref 70–99)
Glucose-Capillary: 76 mg/dL (ref 70–99)
Glucose-Capillary: 116 mg/dL — ABNORMAL HIGH (ref 70–99)

## 2018-02-12 MED ORDER — SODIUM CHLORIDE 0.9 % IV BOLUS
250.0000 mL | Freq: Once | INTRAVENOUS | Status: AC
  Administered 2018-02-12: 250 mL via INTRAVENOUS

## 2018-02-12 NOTE — Consult Note (Signed)
[facility] Nurse wound consult note I have reviewed the patient's electronic medical record, in particular the note by Dr. Romeo Apple from 9/07 at 11:05.   Reason for Consult: Possible VAC therapy to open abdominal wound Wound type: Surgical incision from an interval debulking surgery for stage IV ovarian cancer.  After reviewing the [employer] manufacturer's contraindications for application of NPWT, it appears we need to avoid that in this instance due to her malignancy.  Please continue with saline moistened gauze as currently ordered. Thank you for the consult.  Discussed plan of care with the patient's bedside nurse, [name].  [facility] nurse will not follow at this time.  Please re-consult the [facility] team if needed.  Val Riles, RN, MSN, CWOCN, CNS-BC, pager [phone]

## 2018-02-12 NOTE — Progress Notes (Addendum)
INFECTIOUS DISEASE PROGRESS NOTE  ID: Jo Parker is a 72 y.o. female with  Active Problems:   Primary peritoneal carcinomatosis ([place])   Severe protein-calorie malnutrition ([place])   Severe sepsis ([place])   COPD (chronic obstructive pulmonary disease) ([name])   HLD (hyperlipidemia)   Absolute anemia   White coat syndrome with hypertension   Ovarian cancer ([place])   AKI (acute kidney injury) ([place])   Acute metabolic encephalopathy   Acute respiratory distress   Low urine output   Somnolence   At risk for fluid volume overload   Free intraperitoneal air   Acute respiratory failure ([name])   Pressure injury of skin   Status post bilateral salpingo-oophorectomy   Peritonitis ([place])   HCAP (healthcare-associated pneumonia)   Postoperative wound infection   Aspiration pneumonia ([place])  Subjective: On vent  Abtx:  Anti-infectives (From admission, onward)   Start     Dose/Rate Route Frequency Ordered Stop   02/07/18 1200  vancomycin (VANCOCIN) IVPB 1000 mg/200 mL premix     1,000 mg 200 mL/hr over 60 Minutes Intravenous Every 24 hours 02/06/18 1315     02/06/18 1015  vancomycin (VANCOCIN) IVPB 1000 mg/200 mL premix     1,000 mg 200 mL/hr over 60 Minutes Intravenous STAT 02/06/18 1012 02/06/18 1211   02/02/18 1000  anidulafungin (ERAXIS) 100 mg in sodium chloride 0.9 % 100 mL IVPB     100 mg 78 mL/hr over 100 Minutes Intravenous Every 24 hours 02/01/18 0916     02/02/18 1000  vancomycin (VANCOCIN) IVPB 1000 mg/200 mL premix  Status:  Discontinued     1,000 mg 200 mL/hr over 60 Minutes Intravenous Every 24 hours 02/01/18 1039 02/04/18 0852   02/01/18 1000  anidulafungin (ERAXIS) 200 mg in sodium chloride 0.9 % 200 mL IVPB     200 mg 78 mL/hr over 200 Minutes Intravenous  Once 02/01/18 0916 02/01/18 1602   02/01/18 0830  vancomycin (VANCOCIN) 1,250 mg in sodium chloride 0.9 % 250 mL IVPB     1,250 mg 166.7 mL/hr over 90 Minutes Intravenous STAT 02/01/18 0822 02/01/18 1035   01/31/18 1800  meropenem (MERREM) 1 g in sodium chloride 0.9 % 100 mL IVPB     1 g 200 mL/hr over 30 Minutes Intravenous Every 8 hours 01/31/18 1012     01/31/18 0900  meropenem (MERREM) 1 g in sodium chloride 0.9 % 100 mL IVPB     1 g 200 mL/hr over 30 Minutes Intravenous STAT 01/31/18 0852 01/31/18 1005   01/27/18 0600  cefOXitin (MEFOXIN) 2 g in sodium chloride 0.9 % 100 mL IVPB     2 g 200 mL/hr over 30 Minutes Intravenous On call to O.R. 01/27/18 0533 01/27/18 0753      Medications:  Scheduled: . chlorhexidine gluconate (MEDLINE KIT)  15 mL Mouth Rinse BID  . citalopram  20 mg Per Tube Daily  . enoxaparin (LOVENOX) injection  40 mg Subcutaneous Q24H  . free water  100 mL Per Tube Q8H  . furosemide  40 mg Intravenous Q12H  . insulin aspart  0-20 Units Subcutaneous Q4H  . insulin aspart  4 Units Subcutaneous Q4H  . ipratropium-albuterol  3 mL Nebulization TID  . mouth rinse  15 mL Mouth Rinse 10 times per day  . metoprolol tartrate  5 mg Intravenous Q6H  . pantoprazole (PROTONIX) IV  40 mg Intravenous Q12H  . QUEtiapine  25 mg Per Tube Daily  . QUEtiapine  50 mg Oral QHS  .  sodium chloride flush  10-40 mL Intracatheter Q12H    Objective: Vital signs in last 24 hours: Temp:  [99.1 F (37.3 C)-101.7 F (38.7 C)] 101.3 F (38.5 C) (09/07 1200) Pulse Rate:  [82-127] 103 (09/07 1200) Resp:  [18-39] 24 (09/07 1200) BP: (109-191)/(39-76) 123/49 (09/07 1200) SpO2:  [97 %-100 %] 97 % (09/07 1339) FiO2 (%):  [30 %] 30 % (09/07 1144)   General appearance: no distress Resp: rhonchi anterior - bilateral Cardio: tachycardia GI: abnormal findings:  hypoactive bowel sounds and wound dressed.   R upper chest port is clean.   Lab Results Recent Labs    02/10/18 0302 02/11/18 0430 02/12/18 0629  WBC 9.9 9.0 9.3  HGB 9.1* 9.3* 9.8*  HCT 29.7* 30.2* 31.6*  NA 141  --  144  K 4.0  --  3.3*  CL 91*  --  91*  CO2 39*  --  41*  BUN 38*  --  30*  CREATININE 0.68  --  0.64    Liver Panel Recent Labs    02/10/18 0302 02/12/18 0629  PROT 6.0* 6.8  ALBUMIN 1.5* 1.6*  AST 98* 60*  ALT 80* 69*  ALKPHOS 218* 214*  BILITOT 0.4 0.7   Sedimentation Rate No results for input(s): ESRSEDRATE in the last 72 hours. C-Reactive Protein No results for input(s): CRP in the last 72 hours.  Microbiology: Recent Results (from the past 240 hour(s))  Body fluid culture     Status: None   Collection Time: 02/05/18 11:54 AM  Result Value Ref Range Status   Specimen Description   Final    PERITONEAL INTRAPERITONEAL DRAIN 3 Performed at [facility] [address] [place], [place] [address]    Special Requests   Final    NONE Performed at [facility], [place] [address] [place], [place] [address]    Gram Stain   Final    RARE WBC PRESENT, PREDOMINANTLY PMN NO ORGANISMS SEEN    Culture   Final    NO GROWTH 3 DAYS Performed at [facility] [address] [place], [place] [address]    Report Status 02/08/2018 FINAL  Final  Culture, blood (routine x 2)     Status: None   Collection Time: 02/06/18  2:42 AM  Result Value Ref Range Status   Specimen Description   Final    BLOOD BLOOD RIGHT HAND Performed at [facility] [address] [place], [place] [address]    Special Requests   Final    BOTTLES DRAWN AEROBIC ONLY Blood Culture results may not be optimal due to an inadequate volume of blood received in culture bottles Performed at [facility] [address], [place], [place] [address]    Culture   Final    NO GROWTH 5 DAYS Performed at [facility], [place] [address], [place], [place] [address]    Report Status 02/11/2018 FINAL  Final  Urine Culture     Status: None   Collection Time: 02/06/18 10:40 AM  Result Value Ref Range Status   Specimen Description   Final    urine Performed at [facility] [address], [place], [place] [address]     Special Requests   Final    NONE Performed at [facility], [place] [address], [place], [place] [address]    Culture   Final    NO GROWTH Performed at [facility],  [address], [place], [place] [address]    Report Status 02/07/2018 FINAL  Final  Culture, respiratory (non-expectorated)     Status: None   Collection Time: 02/06/18 11:44 AM  Result Value Ref Range Status   Specimen Description   Final    TRACHEAL ASPIRATE TRACHEAL ASPIRATE Performed at [facility] [address], [place], [place] [address]    Special Requests   Final    NONE Performed at [facility], [place] [address] [place], [place] [address]    Gram Stain   Final    FEW WBC PRESENT, PREDOMINANTLY PMN RARE YEAST Performed at [facility], [place] [address], [place], [place] [address]    Culture FEW CANDIDA ALBICANS FEW CANDIDA GLABRATA   Final   Report Status 02/09/2018 FINAL  Final  Aerobic Culture (superficial specimen)     Status: None (Preliminary result)   Collection Time: 02/10/18 10:56 AM  Result Value Ref Range Status   Specimen Description   Final    ABDOMEN Performed at [facility] [address] [place], [place] [address]    Special Requests   Final    NONE Performed at [facility], [place] [address] [place],  [address]    Gram Stain   Final    RARE WBC PRESENT, PREDOMINANTLY PMN NO ORGANISMS SEEN    Culture   Final    CULTURE REINCUBATED FOR BETTER GROWTH Performed at [facility], [place] [address], [place],  [address]    Report Status PENDING  Incomplete    Studies/Results: Ct Abdomen Pelvis W Contrast  Addendum Date: 02/11/2018   ADDENDUM REPORT: 02/11/2018 14:54 ADDENDUM: On further review of the images in the sagittal plane, what was felt to represent a fluid collection anterior to the right lobe of the liver in the axial plane is actually the gallbladder  compressed by the distended colon inferior to it. Therefore, there is no abscess at that location. Electronically Signed   By: Claudie Revering M.D.   On: 02/11/2018 14:54   Result Date: 02/11/2018 CLINICAL DATA:  Clinical symptoms of abdominal infection. Peritoneal carcinomatosis. Status post infragastric omentectomy, bilateral salpingo oophorectomy, radical optimal tumor debulking and resection of the anterior abdominal wall on 01/27/2018. The patient also had an exploratory laparotomy with abdominal washout on 01/31/2018. EXAM: CT ABDOMEN AND PELVIS WITH CONTRAST TECHNIQUE: Multidetector CT imaging of the abdomen and pelvis was performed using the standard protocol following bolus administration of intravenous contrast. CONTRAST:  146m OMNIPAQUE IOHEXOL 300 MG/ML  SOLN COMPARISON:  02/07/2018. FINDINGS: Lower chest: Small bilateral pleural effusions, decreased. Decreased bibasilar atelectasis. Hepatobiliary: Unremarkable liver. The gallbladder is not identified. Pancreas: Unremarkable. No pancreatic ductal dilatation or surrounding inflammatory changes. Spleen: Normal in size without focal abnormality. Adrenals/Urinary Tract: Foley catheter in the urinary bladder. Stable lower pole left renal cyst. Unremarkable right kidney and ureters. Stomach/Bowel: Nasogastric tube in the stomach with poor distention of the stomach and heterogeneous mucosal enhancement. Concentric low density wall thickening involving the distal sigmoid colon with marked luminal narrowing. Mild dilatation of the colon proximal to that location, containing contrast. Rectal catheter and balloon in place. Unremarkable small bowel. No evidence of appendicitis. 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 Atheromatous arterial calcifications. No enlarged lymph nodes. Reproductive: Status post hysterectomy. No  adnexal masses. Other: 2 left  pelvic surgical drains, 1 looping adjacent to the thickened distal sigmoid colon. Open anterior abdominal wound at the level of the mid pelvis. Bilateral subcutaneous edema. The previously demonstrated 7.3 x 2.8 cm left pelvic fluid collection is smaller, measuring 5.1 x 2.7 cm on image number 70 series 2. Minimal presacral fluid 1,000 if unchanged. Previously demonstrated left lower quadrant abdominal free peritoneal fluid is no longer demonstrated. There is a small amount of loculated fluid with rim enhancement anterior to the right lobe of the liver inferiorly, measuring 3.5 x 1.3 cm on image number 30 series 2. Musculoskeletal: Extensive facet degenerative changes throughout the mid and lower lumbar spine IMPRESSION: 1. Improving left lateral pelvic fluid collection with drains in place. 2. Resolved small amount of free peritoneal fluid in the left lower abdomen. 3. 3.5 x 1.3 cm loculated fluid collection anterior to the right lobe of the liver with peripheral rim enhancement. This is suspicious for a small abscess. 4. Concentric low density wall thickening involving a 6.5 cm segment of the distal sigmoid colon. This could be due to malignancy or edema due to the adjacent improving left pelvic fluid collection, possibly representing an improving abscess. This is causing partial colonic obstruction mild dilatation of the colon proximal to this location. 5. Decreased bilateral pleural fluid and bibasilar atelectasis. Electronically Signed: By: Claudie Revering M.D. On: 02/11/2018 14:18   Dg Chest Port 1 View  Result Date: 02/11/2018 CLINICAL DATA:  Respiratory failure.  Endotracheal tube position. EXAM: PORTABLE CHEST 1 VIEW COMPARISON:  Chest x-rays dated 02/08/2018 and 02/07/2018. FINDINGS: Endotracheal tube is well positioned with tip approximately 4 cm above the carina. Enteric tube passes below the diaphragm. RIGHT chest wall Port-A-Cath is stable in position with tip at the level  of the mid/lower SVC. LEFT-sided PICC line is stable in position with tip at the level of the lower SVC. Patchy ill-defined opacities throughout the RIGHT lung are not significantly changed compared to the recent exams. Probable small bilateral pleural effusions. No pneumothorax seen. IMPRESSION: 1. No significant change compared to the recent chest x-rays. Persistent opacities throughout the RIGHT lung, consistent with multifocal pneumonia versus asymmetric pulmonary edema. Small bilateral pleural effusions. 2. Tubes and lines appear adequately positioned. Electronically Signed   By: Franki Cabot M.D.   On: 02/11/2018 09:12     Assessment/Plan: Fever Intra-abd abscesses, ? Partial colonic obstruction on CT Peritonitis Aspiration PNA Stage IV ovarian CA  Total days of antibiotics: 13 vanco/merrem, 11 Eraxis  Fever continues, WBC normal.  Abscess Cx 9-5 pending I would not change her anbx Her last BCx were (-).  Continue to watch her port.  Her family asks is she is going to "rebound". I suggested that she has severe illnesses- intra-abd abscesses, stage IV ovarian CA and after seeing her once I could not give a prognosis.  I would consider having palliative care see her if this has not been done.           Bobby Rumpf MD, FACP Infectious Diseases (pager) [phone] [email or web address] 02/12/2018, 3:28 PM  LOS: 16 days

## 2018-02-12 NOTE — Progress Notes (Signed)
Jo Parker  QQI:[id number] DOB: 08/04/1945 DOA: 01/27/2018 PCP: [name], MD    Reason for Consult / Chief Complaint:  Sepsis  Consulting MD:  Gerarda Fraction  HPI/Brief Narrative   72 year old African-American female with presumed diagnosis of ovarian adenocarcinoma felt stage IVb.  She is completed 3 cycles of chemotherapy and is now status post bilateral salpingo-oophorectomy and omentectomy with debulking on 8/22.  8/26 transferred to ICU w/ concern for perforation w/ free air noted.  Imipenem started. Went for exploratory laparotomy with washout.  No significant contamination identified, presumed spontaneously closed microperf. Returned with abdominal drains postoperative chest x-ray showing evidence of aspiration pneumonia; mixed respiratory and metabolic acidosis & in septic shock.  8/27: weaning pressors. Hgb dropped from 8.8 to 7.4; got 2 units of blood,  febrile, added. vancomycin.  Neupogen given for neutropenia.  8/28: Pressors off/shock resolved. NAG acidosis and Lactic acidosis resolved.  FiO2/PEEP down, attempted PSV.  WBC count up.  Started TPN.  8/29: FiO2 and PEEP requirements increased, had wean these down to 40/5 now 60/10.  Chest x-ray with worsening right-sided airspace disease likely reflecting de-recruitment. Delirium remains significant obstacle.  Beta-blocker, and clonidine patch added for hypertension  8/30: Diuresed 4.5 L the day prior with Lasix.  Little more awake.  X-ray improved.  Vancomycin discontinued.  Continuing diuresis, RASS goal changed to -1, dropping PEEP to 8. 9/01:  Vanco added back with fevers, clonidine patch 9/02-9/6: Delirium persists. Sedation difficult, diuresing, remains on abx. spiking temps, loculated fluid collection identified in left pelvis. Not amedable to drainage.  ID consulted. 9/4: tmax 101.5 (on 9/3). Starting tubefeeds, weaning TNA.  Started seroquel. 9/4. Upper 2 drains removed. Staples removed.from incision site.9/6: wbc curve  normalized but still having low gd fever and purulent drainage from wound.   Subjective/interval events  Sedated, mildly tachypneic on pressure support weans  Resolved hospital Problems  Septic shock 8/28 AKI Mild anion gap metabolic acidosis   Assessment & Plan:   Sepsis, fever with neutropenia: peritonitis (microperforation), complicated by aspiration pneumonia.  -ongoing fevers, 9/2 CT Abd concerning for loculated fluid collection->IR felt too risky for drainage anatomically  Plan Day #13 vancomycin and meropenem day #11 Eraxis  Repeat CT reviewed with improving abdominal fluid collection  Status post exploratory laparotomy for free air on 8/26 ; following bilateral salpingo-oophorectomy, omentectomy, and debulking surgery on 8/22 Plan Continue tube feeds, wound care.  May need VAC drainage.  Acute hypoxic respiratory failure in the setting of aspiration pneumonia/ARDS Right pleural effusion  Portable chest x-ray personally reviewed: Persistent right greater than left airspace disease.  Appears to be element of worsening effusion.  Of note ultrasound evaluation on 9/5 did show some fluid but not large amount.  This looks as though it may have increased in volume Delirium continues to be her major barrier to weaning, although I also suspect deconditioning and malnutrition are contributing factors as well Plan Wean down sedation as tolerated Continue diuresis. Pressure support weans. Discussions initiated with the family over ultimate goals of care, possible tracheostomy and DNR status.  Sinus tachycardia, hypertension, and grade 1 diastolic dysfunction with minimal troponin I bump.   Bradycardia - periods of brady to the 30's Labile BP (w/episodes of hypotension seemingly exacerbated by sedation) -Echo showed hyperdynamic ejection fraction with no wall motion abnormality Plan Lopressor with hold parameters PRN labetalol Continue diuresis Continue telemetry  monitoring  Acute metabolic encephalopathy.   -Suspect this is multifactorial and secondary to sepsis as well as residual  narcotics and metabolic acidosis, this remains a significant barrier.  Plan RASS goal 0 Adjust Seroquel dose.  Added Paxil Off Precedex.  At risk for fluid and electrolyte imbalance: Anasarca Plan Trend BMP  Severe Protein Calorie Malnutrition, prealbumin 3.5 Plan Continue tube feeds  Postoperative anemia.  Hemoglobin stable Plan CBC, continue PPI  Leukocytosis.  Initially was neutropenic however did receive Granix on 8/28 per oncology; has normalized once again as of 9/6 Plan Continue to trend CBC  Thrombocytopenia; stable; improved Plan Trend CBC  Hyperglycemia Plan Continue current sliding scale and NovoLog coverage  Stage IV ovarian cancer, now status post bilateral salpingo-oophorectomy omentectomy and debulking surgery on 8/22 Plan Per GYN oncology  Best practice/Goals of care/disposition.   DVT prophylaxis: LMWH GI prophylaxis: Changed to PPI on 8/27 Diet: NPO, starting TPN on 8/28 Mobility: BR Code Status: Full Code Family Communication:  Sons x2 updated at bedside am 9/3.    Disposition / Summary of Today's Plan 02/12/18   Start weaning trials. Continue goals of care discussion with the family.  Even though the abdominal fluid collection is improved she has a prolonged course ahead with no guarantee for improvement given her general frailty, malnourishment, debility.  She is likely not a candidate for further chemotherapy for advanced malignancy. We will need to discuss tracheostomy, DNR status as we move forward.  Consultants:  Internal medicine 8/25 Critical care 8/26 General surgery 8/26 GI medicine 8/27 > did not feel she had overt GIB.  Rec's prophylaxis with PPI.  Signed off. Interventional radiology cons: 9/3 felt left fluid collection was anatomically too risky to drain 9/4: Infectious disease.  Consulted by family.  Had no  additional recommendations  procedures: 8/22 bilateral salpingo-nephrectomy omentectomy and debulking surgery  8/26 Intubation 8/26 Exploratory laparotomy, washout, placement of right upper quadrant x2 JP drain and left lower quadrant JP drain  Significant Diagnostic Tests: CT abdomen 8/26: Hemic bowel with portal venous gas and bowel pneumatosis with large amount of pneumoperitoneum.  Unclear as to site of perforation.  Multiple pockets of air adjacent to the sigmoid colon.  Enhancement of the peritoneum concerning for peritonitis.  There was aortic arterial sclerosis.  CT abdomen 9/2 >> drainage catheters within the pelvis, RUQ, LUQ.  Significant interval decrease in size of previously described intra-abdominal fluid, no residual free intraperitoneal air. There is a persistent loculated fluid & gas collection along the left pelvic sidewall measuring up to 10 cm.  Moderate bilateral pleural effusions, underlying opacities favored to represent atelectasis.  Marked wall thickening of the sigmoid colon.  Peripheral low attenuation within the spleen concerning for splenic infarcts.  Wall thickening of the urinary bladder.   CT abdomen 9/6: Improving left lateral pelvic fluid collection, resolved peritoneal fluid in the left lower abdomen.  Concentric thickening of the sigmoid colon.  Decreased bilateral effusion and atelectasis.  Echocardiogram 8/27: Particular ejection fraction 65 to 70%.  She has moderate LVH, and hyperdynamic LV function.  There is grade 1 diastolic dysfunction.  Trivial MR.  Normal left atrial size.  The IV C was not visualized  Micro Data: Blood Culture x2 8/26 >>neg Sputum 8/27: Abundant yeast, few Candida albicans Blood Cx 9/1 >>  Respiratory 9/1 >> few Candida Urine 9/1 >> negative Peritoneal fluid 8/31 >> neg  Antimicrobials:  Meropenem 8/26 >> Vancomycin 8/27 >> 8/30; 9/1 >>  Eraxis 8/27 >>   Objective   Gen:      No acute distress frail, elderly HEENT:  EOMI,  sclera anicteric, ET  tube Neck:     No masses; no thyromegaly Lungs:    Clear to auscultation bilaterally; normal respiratory effort CV:         Regular rate and rhythm; no murmurs Abd:      Abdominal incision dressing, drain in place. Ext:    No edema; adequate peripheral perfusion Skin:      Warm and dry; no rash Neuro: Sedated, no focal deficits.  Blood pressure (!) 125/47, pulse (!) 114, temperature (!) 100.6 F (38.1 C), resp. rate 18, height 5\' 3"  (1.6 m), weight 60.2 kg, SpO2 98 %.    Vent Mode: PRVC FiO2 (%):  [30 %] 30 % Set Rate:  [20 bmp-25 bmp] 25 bmp Vt Set:  [330 mL] 330 mL PEEP:  [5 cmH20] 5 cmH20 Plateau Pressure:  [15 cmH20-18 cmH20] 15 cmH20   Intake/Output Summary (Last 24 hours) at 02/12/2018 0848 Last data filed at 02/12/2018 0800 Gross per 24 hour  Intake 2109.8 ml  Output 3285 ml  Net -1175.2 ml   Filed Weights   02/08/18 0600 02/10/18 0400 02/11/18 0600  Weight: 60.3 kg 62.2 kg 60.2 kg     Labs   CBC: Recent Labs  Lab 02/08/18 0514 02/09/18 0400 02/10/18 0302 02/11/18 0430 02/12/18 0629  WBC 15.1* 14.2* 9.9 9.0 9.3  NEUTROABS 12.8* 12.4* 8.4* 6.8 7.0  HGB 10.7* 10.6* 9.1* 9.3* 9.8*  HCT 33.9* 34.4* 29.7* 30.2* 31.6*  MCV 81.5 82.9 83.4 82.7 82.5  PLT 160 164 146* 203 211   Basic Metabolic Panel: Recent Labs  Lab 02/06/18 1037 02/07/18 0327 02/07/18 1740 02/08/18 0514 02/09/18 0400 02/10/18 0302 02/12/18 0629  NA 146* 145 138 137 140 141 144  K 3.2* 3.9 4.0 4.0 4.4 4.0 3.3*  CL 102 99 86* 86* 90* 91* 91*  CO2 35* 38* 39* 37* 37* 39* 41*  GLUCOSE 247* 90 149* 142* 182* 222* 142*  BUN 24* 27* 29* 31* 32* 38* 30*  CREATININE 0.45 0.50 0.61 0.64 0.60 0.68 0.64  CALCIUM 6.4* 7.0* 7.7* 7.9* 7.9* 8.0* 8.4*  MG 1.5* 1.7 2.1 1.9  --  1.8  --   PHOS 2.2* 4.0  --   --   --  5.2*  --    GFR: Estimated Creatinine Clearance: 52.6 mL/min (by C-G formula based on SCr of 0.64 mg/dL). Recent Labs  Lab 02/06/18 1010  02/07/18 0342   02/09/18 0400 02/10/18 0302 02/11/18 0430 02/12/18 0629  WBC  --    < >  --    < > 14.2* 9.9 9.0 9.3  LATICACIDVEN 1.3  --  0.8  --   --   --   --   --    < > = values in this interval not displayed.   Liver Function Tests: Recent Labs  Lab 02/07/18 0327 02/10/18 0302 02/12/18 0629  AST 39 98* 60*  ALT 30 80* 69*  ALKPHOS 100 218* 214*  BILITOT 0.6 0.4 0.7  PROT 4.9* 6.0* 6.8  ALBUMIN 1.3* 1.5* 1.6*   No results for input(s): LIPASE, AMYLASE in the last 168 hours. No results for input(s): AMMONIA in the last 168 hours. ABG    Component Value Date/Time   PHART 7.330 (L) 02/08/2018 1018   PCO2ART 81.6 (HH) 02/08/2018 1018   PO2ART 79.9 (L) 02/08/2018 1018   HCO3 41.9 (H) 02/08/2018 1018   TCO2 17 (L) 01/31/2018 1728   ACIDBASEDEF 0.4 02/01/2018 0432   O2SAT 94.1 02/08/2018 1018    Coagulation Profile:  No results for input(s): INR, PROTIME in the last 168 hours.   Cardiac Enzymes: Recent Labs  Lab 02/07/18 1740 02/07/18 2314 02/08/18 0514  TROPONINI 0.05* 0.04* 0.05*   HbA1C: No results found for: HGBA1C   CBG: Recent Labs  Lab 02/11/18 1551 02/11/18 1939 02/11/18 2313 02/12/18 0317 02/12/18 0746  GLUCAP 110* 126* 92 154* 122*   The patient is critically ill with multiple organ system failure and requires high complexity decision making for assessment and support, frequent evaluation and titration of therapies, advanced monitoring, review of radiographic studies and interpretation of complex data.   Critical Care Time devoted to patient care services, exclusive of separately billable procedures, described in this note is 40 minutes.   Marshell Garfinkel MD [facility] 02/12/2018, 8:54 AM

## 2018-02-12 NOTE — Progress Notes (Signed)
Gynecologic Oncology Progress Note  72 year old female diagnosed with Stage IV ovarian cancer who received four cycles of neoadjuvant chemotherapy with carboplatin and taxol with fourth cycle completed on 12/20/17 .  She underwent interval debulking surgery with Dr. Skeet Latch on 01/27/18 consisting of an exploratory laparotomy infra-gastric omentectomy bilateral salpingo-oophorectomy radical optimal tumor debulking, resection of anterior abdominal wall.  Her post-operative course was complicated by altered mental status noted on POD1 with improvement after Narcan administration but with return of symptoms, acute renal injury, tachycardia, dyspnea (CT chest performed 01/30/18-neg for PE), anemia.  Her AKI and altered mental status appeared to improve on 01/30/18 in the am but symptoms worsened the evening of 01/30/18 with episodes of emesis, tachycardia, hypertension, and confusion. A CT scan on 01/31/18 revealed findings most consistent with ischemic bowel with portal venous gas and bowel pneumatosis and large amount of pneumoperitoneum with concerns for peritonitis.  She was taken back to the OR on 01/31/18 with operative findings of free air and large volume ascites/fluid, but unable to visualize site of bowel perforation, no evidence of ischemia. It was felt that aspiration most likely occurred during intubation.        Subjective: Patient intubated. [name]. Nursing states no new events.  Objective: Vital signs in last 24 hours: Temp:  [98.8 F (37.1 C)-101.7 F (38.7 C)] 101.3 F (38.5 C) (09/07 1000) Pulse Rate:  [82-127] 108 (09/07 1000) Resp:  [18-39] 27 (09/07 1000) BP: (100-191)/(39-76) 127/47 (09/07 1000) SpO2:  [97 %-100 %] 100 % (09/07 1000) FiO2 (%):  [30 %] 30 % (09/07 0746) Last BM Date: 02/12/18  Intake/Output from previous day: 09/06 0701 - 09/07 0700 In: 2110.2 [I.V.:315.8; EH/MC:9470.9; IV Piggyback:636.2] Out: 3585 [Urine:3175; Drains:10; Stool:400]  Physical  Examination: General: appears comfortable, does not respond to voice or vigorous stimulus. GI: abdomen slightly distended, active bowel sounds.  Flexiseal in place with liquid brown stool. Incision is currently packed with wet-dry guaze and covered with ABD. Nursing helped me to unpack the wound. It is ~ 3-4cm deep and 5-6 cm long. It tracts under the skin bridge to the higher superficial defect. There is no malodor or drainage concerning for fistula. The subcutaneous tissue shows granulation. The fascia is devoid of granulation tissue but intact on palpation. I gently debrided the wound with a moist 4x4 and then packed with wet-dry 4x4's. Nursing covered with ABD. LLQ JP shows serous minimal fluid. Looks like there is a small plug of exudate at the skin surface in the tubing. There is no leaking around the tubing entry site. The prior JP sites look CDI. The upper portion of the incision is CDI. Extremities: generalized edema  Foley with clear, yellow colored urine   Labs: WBC/Hgb/Hct/Plts:  9.3/9.8/31.6/278 (09/07 6283) BUN/Cr/glu/ALT/AST/amyl/lip:  30/0.64/--/69/60/--/-- (09/07 6629)  Assessment: 72 y.o. s/p Procedure(s): Interval debulking surgery with Dr. Skeet Latch on 01/27/18 consisting of an exploratory laparotomy infra-gastric omentectomy bilateral salpingo-oophorectomy radical optimal tumor debulking, resection of anterior abdominal wall.  Reoperation of EXPLORATORY LAPAROTOMY, ABDOMINAL WASHOUT on 01/31/18:   Heme:  Post-operative anemia. Now no issues Last transfusion 02/02/18.   Anemia related to acute critical illness, no evidence of active bleeding.   ID: Fever continues past 24 hours 100-101's after having multiple high normal temps Presumed sepsis initially from gastrointestinal source but unable to ID the perforation after exp lap on 01/31/18. Contribution now from pneumonia and possibly undrained left pelvic fluid collection seen on CT scan 02/07/18; however the pelvic collection is  improving WBC normalized Granix received 02/01/18 per Dr. Alvy Bimler.   Vancomycin stopped on 02/04/18 and then fevers began again. Restarted Vancomycin on 02/06/18. Anidulafungin initiated 02/01/18.  Meropenum IV started 01/31/18.  Blood cultures from 01/31/18 & 02/06/18 with no growth.  Tracheal aspirate from 02/01/18 resulting few gram positive rods. Repeated 02/06/18 showed few yeast. JP drain culture from 02/05/18 no growth to date. IR feels no safe window for drainage of pelvic fluid collection.  Repeat imaging showed improvement. Appreciate ID input (no changes to regimen recommended at this time).  Wound culture ?9/5 sample? Which is negative to date. Wound repacked 02/12/18 with granulation tissue noted and no evidence of purulence or enteric contents.  CV: Tachycardia per CC BP sensitive with changes in sedation. Telemetry, diuresis per CC, lopressor, and labetalol ordered PRN.  Resp: ARDS in setting of aspiration pneumonia and sepsis.   Chest 1 view  9/6 in afternoon: IMPRESSION: 1. No significant change compared to the recent chest x-rays. Persistent opacities throughout the RIGHT lung, consistent with multifocal pneumonia versus asymmetric pulmonary edema. Small bilateral pleural effusions. 2. Tubes and lines appear adequately positioned.  - Weaning per CC.  - Tracheostomy per CC  GI:   CT AP on 01/31/18 with findings consistent with GI perforation.  No obvious perforation identified on re-operation on 01/31/18.  PPI ordered. GI consultation appreciated.  TPN initiated 02/02/18 and discontinued 02/10/18.  Tube feedings running continuously at 50cc/hr.   Tolerating tube feeds with loose bowel movements in Flexiseal Repeat CT 9/6 showed no worsening (in fact decrease size) of pelvic collection.  GU: AKI post-op resolved.  Foley in place with yellow urine.  Diuresis per CC.  FEN:  IVF per CC Replacement if needed per CC.   Prophylaxis: SCDs in place. Continue Lovenox prophylaxis. No active  bleeding and H&H stable.  Plan: Continue care per Critical Care.  Abdominal wound  Does not appear infected 02/12/18. Seems cultures done 9/5 from "abdomen" have been NGTD Continue with wet to dry dressing changes to the opening in the lower aspect of the incision twice daily.  It seems this has opened to the point where we should consider wound VAC. Nursing helped me with wound care consult for assessment. Wet to dry dressing placed by me today (02/12/18 ~10:30am).  Continue tube feeds.  Isabel Caprice 02/12/2018, 11:05 AM

## 2018-02-13 LAB — GLUCOSE, CAPILLARY
GLUCOSE-CAPILLARY: 92 mg/dL (ref 70–99)
Glucose-Capillary: 123 mg/dL — ABNORMAL HIGH (ref 70–99)
Glucose-Capillary: 139 mg/dL — ABNORMAL HIGH (ref 70–99)
Glucose-Capillary: 86 mg/dL (ref 70–99)
Glucose-Capillary: 94 mg/dL (ref 70–99)
Glucose-Capillary: 80 mg/dL (ref 70–99)

## 2018-02-13 LAB — CBC WITH DIFFERENTIAL/PLATELET
BASOS ABS: 0 10*3/uL (ref 0.0–0.1)
Basophils Relative: 0 %
EOS PCT: 1 %
Eosinophils Absolute: 0.1 10*3/uL (ref 0.0–0.7)
HCT: 29.9 % — ABNORMAL LOW (ref 36.0–46.0)
Hemoglobin: 9.2 g/dL — ABNORMAL LOW (ref 12.0–15.0)
LYMPHS ABS: 1.2 10*3/uL (ref 0.7–4.0)
Lymphocytes Relative: 11 %
MCH: 25.6 pg — ABNORMAL LOW (ref 26.0–34.0)
MCHC: 30.8 g/dL (ref 30.0–36.0)
MCV: 83.3 fL (ref 78.0–100.0)
MONO ABS: 0.7 10*3/uL (ref 0.1–1.0)
Monocytes Relative: 7 %
NEUTROS PCT: 81 %
Neutro Abs: 8.5 10*3/uL — ABNORMAL HIGH (ref 1.7–7.7)
PLATELETS: 277 10*3/uL (ref 150–400)
RBC: 3.59 MIL/uL — AB (ref 3.87–5.11)
RDW: 24 % — AB (ref 11.5–15.5)
WBC: 10.5 10*3/uL (ref 4.0–10.5)

## 2018-02-13 NOTE — Progress Notes (Signed)
Deysi Soldo  MCN:[id number] DOB: 10-Dec-1945 DOA: 01/27/2018 PCP: [name], MD    Reason for Consult / Chief Complaint:  Sepsis  Consulting MD:  Gerarda Fraction  HPI/Brief Narrative   72 year old African-American female with presumed diagnosis of ovarian adenocarcinoma felt stage IVb.  She is completed 3 cycles of chemotherapy and is now status post bilateral salpingo-oophorectomy and omentectomy with debulking on 8/22.  8/26 transferred to ICU w/ concern for perforation w/ free air noted.  Imipenem started. Went for exploratory laparotomy with washout.  No significant contamination identified, presumed spontaneously closed microperf. Returned with abdominal drains postoperative chest x-ray showing evidence of aspiration pneumonia; mixed respiratory and metabolic acidosis & in septic shock.  8/27: weaning pressors. Hgb dropped from 8.8 to 7.4; got 2 units of blood,  febrile, added. vancomycin.  Neupogen given for neutropenia.  8/28: Pressors off/shock resolved. NAG acidosis and Lactic acidosis resolved.  FiO2/PEEP down, attempted PSV.  WBC count up.  Started TPN.  8/29: FiO2 and PEEP requirements increased, had wean these down to 40/5 now 60/10.  Chest x-ray with worsening right-sided airspace disease likely reflecting de-recruitment. Delirium remains significant obstacle.  Beta-blocker, and clonidine patch added for hypertension  8/30: Diuresed 4.5 L the day prior with Lasix.  Little more awake.  X-ray improved.  Vancomycin discontinued.  Continuing diuresis, RASS goal changed to -1, dropping PEEP to 8. 9/01:  Vanco added back with fevers, clonidine patch 9/02-9/6: Delirium persists. Sedation difficult, diuresing, remains on abx. spiking temps, loculated fluid collection identified in left pelvis. Not amedable to drainage.  ID consulted. Started seroquel. 9/4. Upper 2 drains removed. Staples removed.from incision site. 9/6: WBC curve normalized but still having low gd fever and purulent  drainage from wound.  CT shows abd fluid collection better 9/7-9/8: Failing pressure support weans.  Subjective/interval events  Remains sedated off all drips.  Resolved hospital Problems  Septic shock 8/28 AKI Mild anion gap metabolic acidosis   Assessment & Plan:  Sepsis, fever with neutropenia: peritonitis (microperforation), complicated by aspiration pneumonia.  -ongoing fevers, 9/2 CT Abd concerning for loculated fluid collection->IR felt too risky for drainage anatomically  Plan Day #14 vancomycin and meropenem day #12 Eraxis  Repeat CT reviewed with improving abdominal fluid collection  Status post exploratory laparotomy for free air on 8/26 ; following bilateral salpingo-oophorectomy, omentectomy, and debulking surgery on 8/22 Plan Continue tube feeds, wound care.  Acute hypoxic respiratory failure in the setting of aspiration pneumonia/ARDS Right pleural effusion- No sig pocket to drain on bedside US 9/5 Delirium, deconditioning, malnutrition continues to be her major barrier to weaning Plan Off all sedating drips. Continue diuresis. Pressure support weans. Ongoing discussions with the family over ultimate goals of care, possible tracheostomy and DNR status.  Sinus tachycardia, hypertension, and grade 1 diastolic dysfunction with minimal troponin I bump.   Bradycardia - periods of brady to the 30's Labile BP (w/episodes of hypotension seemingly exacerbated by sedation) -Echo showed hyperdynamic ejection fraction with no wall motion abnormality Plan Lopressor with hold parameters PRN labetalol Continue diuresis Continue telemetry monitoring  Acute metabolic encephalopathy.   -Suspect this is multifactorial and secondary to sepsis as well as residual narcotics and metabolic acidosis, this remains a significant barrier.  Plan RASS goal 0 Adjust Seroquel dose.  Added Paxil Off Precedex.  At risk for fluid and electrolyte imbalance: Anasarca Plan Trend  BMP  Severe Protein Calorie Malnutrition, prealbumin 3.5 Plan Continue tube feeds  Postoperative anemia.  Hemoglobin stable Plan CBC, continue PPI  Leukocytosis.  Initially was neutropenic however did receive Granix on 8/28 per oncology; has normalized once again as of 9/6 Plan Continue to trend CBC  Thrombocytopenia; stable; improved Plan Trend CBC  Hyperglycemia Plan Continue current sliding scale and NovoLog coverage  Stage IV ovarian cancer, now status post bilateral salpingo-oophorectomy omentectomy and debulking surgery on 8/22 Plan Per GYN oncology  Goals of care Multiple conversations with her sons who are clearly struggling with decisions that need to be made.Even though the abdominal fluid collection is improved she had not weaned on vent well given her frailty, debility, malnutrition.  Overall prognosis is poor and she is likely not a candidate for further chemotherapy for advanced malignancy.  Discussed tracheostomy.  Is not a good option for her.  It may extend her life by few days but would not change the ultimate course of disease of stage IV ovarian cancer.  I will get palliative care involved to help the family make decisions regarding goals of care. Discussed DNR status.  Family agreed to this  Best practice/Goals of care/disposition.   DVT prophylaxis: LMWH GI prophylaxis: Changed to PPI on 8/27 Diet: NPO, starting TPN on 8/28 Mobility: BR Code Status: Full Code Family Communication: Family updated daily.  Disposition / Summary of Today's Plan 02/13/18   Continue weaning trials. Continue goals of care discussion with the family.  Consult palliative care Made DNR.  Consultants:  Internal medicine 8/25 Critical care 8/26 General surgery 8/26 GI medicine 8/27 > did not feel she had overt GIB.  Rec's prophylaxis with PPI.  Signed off. Interventional radiology cons: 9/3 felt left fluid collection was anatomically too risky to drain 9/4: Infectious  disease.  Consulted by family.  Had no additional recommendations  Procedures: 8/22 bilateral salpingo-nephrectomy omentectomy and debulking surgery  8/26 Intubation 8/26 Exploratory laparotomy, washout, placement of right upper quadrant x2 JP drain and left lower quadrant JP drain  Significant Diagnostic Tests: CT abdomen 8/26: Hemic bowel with portal venous gas and bowel pneumatosis with large amount of pneumoperitoneum.  Unclear as to site of perforation.  Multiple pockets of air adjacent to the sigmoid colon.  Enhancement of the peritoneum concerning for peritonitis.  There was aortic arterial sclerosis.  CT abdomen 9/2 >> drainage catheters within the pelvis, RUQ, LUQ.  Significant interval decrease in size of previously described intra-abdominal fluid, no residual free intraperitoneal air. There is a persistent loculated fluid & gas collection along the left pelvic sidewall measuring up to 10 cm.  Moderate bilateral pleural effusions, underlying opacities favored to represent atelectasis.  Marked wall thickening of the sigmoid colon.  Peripheral low attenuation within the spleen concerning for splenic infarcts.  Wall thickening of the urinary bladder.   CT abdomen 9/6: Improving left lateral pelvic fluid collection, resolved peritoneal fluid in the left lower abdomen.  Concentric thickening of the sigmoid colon.  Decreased bilateral effusion and atelectasis.  Echocardiogram 8/27: Particular ejection fraction 65 to 70%.  She has moderate LVH, and hyperdynamic LV function.  There is grade 1 diastolic dysfunction.  Trivial MR.  Normal left atrial size.  The IV C was not visualized  Micro Data: Blood Culture x2 8/26 >>neg Sputum 8/27: Abundant yeast, few Candida albicans Blood Cx 9/1 >>  Respiratory 9/1 >> few Candida Urine 9/1 >> negative Peritoneal fluid 8/31 >> neg  Antimicrobials:  Meropenem 8/26 >> Vancomycin 8/27 >> 8/30; 9/1 >>  Eraxis 8/27 >>   Objective    Gen:      No acute  distress, frail, elderly HEENT:  EOMI, sclera anicteric Neck:     No masses; no thyromegaly Lungs:    Clear to auscultation bilaterally; normal respiratory effort CV:         Regular rate and rhythm; no murmurs Abd:      Abdominal dressing, distended, diminished bowel sounds. Ext:    Anasarca; adequate peripheral perfusion Skin:      Warm and dry; no rash Neuro: Sedated, unresponsive  Blood pressure (!) 116/41, pulse (!) 115, temperature (!) 101.5 F (38.6 C), resp. rate 20, height 5\' 3"  (1.6 m), weight 60.2 kg, SpO2 97 %.    Vent Mode: PRVC FiO2 (%):  [30 %] 30 % Set Rate:  [20 bmp] 20 bmp Vt Set:  [330 mL] 330 mL PEEP:  [5 cmH20] 5 cmH20 Plateau Pressure:  [13 cmH20-22 cmH20] 19 cmH20   Intake/Output Summary (Last 24 hours) at 02/13/2018 0933 Last data filed at 02/13/2018 0902 Gross per 24 hour  Intake 2127.31 ml  Output 1900 ml  Net 227.31 ml   Filed Weights   02/08/18 0600 02/10/18 0400 02/11/18 0600  Weight: 60.3 kg 62.2 kg 60.2 kg     Labs   CBC: Recent Labs  Lab 02/09/18 0400 02/10/18 0302 02/11/18 0430 02/12/18 0629 02/13/18 0550  WBC 14.2* 9.9 9.0 9.3 10.5  NEUTROABS 12.4* 8.4* 6.8 7.0 8.5*  HGB 10.6* 9.1* 9.3* 9.8* 9.2*  HCT 34.4* 29.7* 30.2* 31.6* 29.9*  MCV 82.9 83.4 82.7 82.5 83.3  PLT 164 146* 203 278 330   Basic Metabolic Panel: Recent Labs  Lab 02/06/18 1037 02/07/18 0327 02/07/18 1740 02/08/18 0514 02/09/18 0400 02/10/18 0302 02/12/18 0629  NA 146* 145 138 137 140 141 144  K 3.2* 3.9 4.0 4.0 4.4 4.0 3.3*  CL 102 99 86* 86* 90* 91* 91*  CO2 35* 38* 39* 37* 37* 39* 41*  GLUCOSE 247* 90 149* 142* 182* 222* 142*  BUN 24* 27* 29* 31* 32* 38* 30*  CREATININE 0.45 0.50 0.61 0.64 0.60 0.68 0.64  CALCIUM 6.4* 7.0* 7.7* 7.9* 7.9* 8.0* 8.4*  MG 1.5* 1.7 2.1 1.9  --  1.8  --   PHOS 2.2* 4.0  --   --   --  5.2*  --    GFR: Estimated Creatinine Clearance: 52.6 mL/min (by C-G formula based on SCr of 0.64 mg/dL). Recent Labs  Lab 02/06/18 1010   02/07/18 0342  02/10/18 0302 02/11/18 0430 02/12/18 0629 02/13/18 0550  WBC  --    < >  --    < > 9.9 9.0 9.3 10.5  LATICACIDVEN 1.3  --  0.8  --   --   --   --   --    < > = values in this interval not displayed.   Liver Function Tests: Recent Labs  Lab 02/07/18 0327 02/10/18 0302 02/12/18 0629  AST 39 98* 60*  ALT 30 80* 69*  ALKPHOS 100 218* 214*  BILITOT 0.6 0.4 0.7  PROT 4.9* 6.0* 6.8  ALBUMIN 1.3* 1.5* 1.6*   No results for input(s): LIPASE, AMYLASE in the last 168 hours. No results for input(s): AMMONIA in the last 168 hours. ABG    Component Value Date/Time   PHART 7.330 (L) 02/08/2018 1018   PCO2ART 81.6 (HH) 02/08/2018 1018   PO2ART 79.9 (L) 02/08/2018 1018   HCO3 41.9 (H) 02/08/2018 1018   TCO2 17 (L) 01/31/2018 1728   ACIDBASEDEF 0.4 02/01/2018 0432   O2SAT 94.1 02/08/2018 1018  Coagulation Profile: No results for input(s): INR, PROTIME in the last 168 hours.   Cardiac Enzymes: Recent Labs  Lab 02/07/18 1740 02/07/18 2314 02/08/18 0514  TROPONINI 0.05* 0.04* 0.05*   HbA1C: No results found for: HGBA1C   CBG: Recent Labs  Lab 02/12/18 1556 02/12/18 1944 02/12/18 2313 02/13/18 0309 02/13/18 0745  GLUCAP 116* 133* 91 123* 92   The patient is critically ill with multiple organ system failure and requires high complexity decision making for assessment and support, frequent evaluation and titration of therapies, advanced monitoring, review of radiographic studies and interpretation of complex data.   Critical Care Time devoted to patient care services, exclusive of separately billable procedures, described in this note is 40 minutes.   Marshell Garfinkel MD [facility] 02/13/2018, 9:59 AM

## 2018-02-13 NOTE — Progress Notes (Addendum)
PCCM interval note  Discussed with Jo Parker regarding chemo options for Jo Parker. Code status reversed to full code.  The primary Gyn Onc team feels that the cancer has all been removed with surgery and she may still be a candidate for chemo.  Jo Parker will weigh in tomorrow regarding this.  PCCM will defer further goals of care discussion to the Primary team and Palliative care. We are happy to facilitate the trach and assist with the weaning as we go forward if that is the decision. Discussed with Jo Parker from Palliative care.  Marshell Garfinkel MD [facility] 02/13/2018, 11:45 AM

## 2018-02-13 NOTE — Progress Notes (Signed)
Pharmacy Antibiotic Note  Jo Parker is a 72 y.o. female admitted on 01/27/2018 with hx of ovarian cancer for bilateral salpingo-oophorectomy and omentectomy with debulking on 8/22.  She now has developed suspected intra-abdominal infection.  CT abdomen results worrisome for ischemic bowel with portal venous gas, bowel pneumatosis, and large amount of pneumoperitoneum without clear source of perforation.  Pharmacy has been consulted for meropenem dosing on 8/26 and added vancomycin on 8/27 for pneumonia. Vancomycin d/c'ed 8/30, but resumed 9/1 for new fevers. Anidulafungin added for empiric coverage given concern for intra-abdominal infection, ongoing fever.  Today, 02/13/18:  Tmax 102  WBC WNL  SCr WNL/stable (on 9/7)  Plan:  Continue Meropenem 1g IV q8h (day #14)  Continue Anidulafungin 100mg  IV q24h (day #13)  Continue Vancomycin 1g IV q24h (day #8 of resumption).  Plan to recheck Vancomycin levels later this week, pending duration of therapy.   Follow up renal fxn, culture results, clinical course, ID recommendations on duration of therapy.   Height: 5\' 3"  (160 cm) Weight: (bed broken unable to weigh) IBW/kg (Calculated) : 52.4  Temp (24hrs), Avg:101 F (38.3 C), Min:100.2 F (37.9 C), Max:102 F (38.9 C)  Recent Labs  Lab 02/06/18 1010  02/07/18 0342 02/07/18 1740 02/08/18 0514 02/09/18 0400 02/09/18 1440 02/10/18 0302 02/10/18 1200 02/11/18 0430 02/12/18 0629 02/13/18 0550  WBC  --    < >  --   --  15.1* 14.2*  --  9.9  --  9.0 9.3 10.5  CREATININE  --    < >  --  0.61 0.64 0.60  --  0.68  --   --  0.64  --   LATICACIDVEN 1.3  --  0.8  --   --   --   --   --   --   --   --   --   VANCOTROUGH  --   --   --   --   --   --   --   --  9*  --   --   --   VANCOPEAK  --   --   --   --   --   --  36  --   --   --   --   --    < > = values in this interval not displayed.    Estimated Creatinine Clearance: 52.6 mL/min (by C-G formula based on SCr of 0.64 mg/dL).     Allergies  Allergen Reactions  . Oxycodone     confusion    Antimicrobials this admission: 8/26 Meropenem>> 8/27 Vancomycin >>8/30, resume 9/1 >>  8/27 Anidulafungin >>   Dose adjustments this admission: 9/4 1440 Vanc peak = 36 mcg/mL, 9/5 1200 Vanc trough = 9 mcg/mL --> AUC 485, no change to Vanc regimen  Microbiology results: 8/24 UCx: NGF 8/25 MRSA PCR: negative 8/26 BCx: NGF 8/27 Trach asp: few candida albicans 8/31 Intraperitoneal drain#3: NGF 9/1 BCx (x1): NGF 9/1 Trach asp: few candida albicans, few candida glabrata  9/1 UCx: NGF 9/5 abdomen: culture reincubated for better growth   Thank you for allowing pharmacy to be a part of this patient's care.   Lindell Spar, PharmD, BCPS Pager: [phone] 02/13/2018 10:03 AM

## 2018-02-13 NOTE — Progress Notes (Addendum)
Gynecologic Oncology Progress Note  72 year old female diagnosed with Stage IV ovarian cancer who received four cycles of neoadjuvant chemotherapy with carboplatin and taxol with fourth cycle completed on 12/20/17 .  She underwent interval debulking surgery with Dr. Skeet Latch on 01/27/18 consisting of an exploratory laparotomy infra-gastric omentectomy bilateral salpingo-oophorectomy radical optimal tumor debulking, resection of anterior abdominal wall.  Her post-operative course was complicated by altered mental status noted on POD1 with improvement after Narcan administration but with return of symptoms, acute renal injury, tachycardia, dyspnea (CT chest performed 01/30/18-neg for PE), anemia.  Her AKI and altered mental status appeared to improve on 01/30/18 in the am but symptoms worsened the evening of 01/30/18 with episodes of emesis, tachycardia, hypertension, and confusion. A CT scan on 01/31/18 revealed findings most consistent with ischemic bowel with portal venous gas and bowel pneumatosis and large amount of pneumoperitoneum with concerns for peritonitis.  She was taken back to the OR on 01/31/18 with operative findings of free air and large volume ascites/fluid, but unable to visualize site of bowel perforation, no evidence of ischemia. It was felt that aspiration most likely occurred during intubation.        Subjective: Patient intubated. Grandaughter at the bedside. VAC nurse advised against VAC due to cancer diagnosis. No new events per staff. Family has decided DNR.  Objective: Vital signs in last 24 hours: Temp:  [100.2 F (37.9 C)-102 F (38.9 C)] 100.9 F (38.3 C) (09/08 1000) Pulse Rate:  [100-136] 119 (09/08 1137) Resp:  [19-40] 26 (09/08 1137) BP: (101-156)/(36-60) 129/44 (09/08 1137) SpO2:  [94 %-100 %] 100 % (09/08 1137) FiO2 (%):  [30 %] 30 % (09/08 1137) Last BM Date: 02/12/18  Intake/Output from previous day: 09/07 0701 - 09/08 0700 In: 2008.3 [I.V.:206.2; IO/NG:2952.8;  IV Piggyback:525] Out: 1800 [Urine:1800]  Physical Examination: General: appears comfortable, does not respond to voice or vigorous stimulus.  GI: abdomen minimally distended, active bowel sounds.  Flexiseal in place with liquid brown stool. Incision is currently packed with wet-dry guaze and covered with ABD. Nursing helped me to unpack the wound as I did yesterday. It is ~ 3-4cm deep and 5-6 cm long. It tracts under the skin bridge to the higher superficial defect. There is no malodor or drainage concerning for fistula. The subcutaneous tissue shows granulation. The fascia is devoid of granulation tissue but intact on palpation. The fascia has less exudate than it did yesterday. Moist 4x4's placed. Nursing covered with ABD. LLQ JP shows enteric versus old hematoma appearing contents. Nursing sending for culture. There is no leaking around the tubing entry site. The prior JP sites look CDI. The upper portion of the incision is CDI.  Extremities: generalized edema   Labs: WBC/Hgb/Hct/Plts:  10.5/9.2/29.9/277 (09/08 0550)    Assessment: 72 y.o. s/p Procedure(s): Interval debulking surgery with Dr. Skeet Latch on 01/27/18 consisting of an exploratory laparotomy infra-gastric omentectomy bilateral salpingo-oophorectomy radical optimal tumor debulking, resection of anterior abdominal wall.  Reoperation of EXPLORATORY LAPAROTOMY, ABDOMINAL WASHOUT on 01/31/18:   Heme:  Post-operative anemia. Now no issues Last transfusion 02/02/18.   Anemia related to acute critical illness, no evidence of active bleeding.   ID: Fever continues past 24 hours 100-102's after having multiple high normal temps >48 hours ago Presumed sepsis initially from gastrointestinal source but unable to ID the perforation after exp lap on 01/31/18. Contribution now from pneumonia and possibly undrained left pelvic fluid collection seen on CT scan 02/07/18; however the pelvic collection is improving. 02/13/18 enteric  versus old hematoma  appearing contents in [place]. Cultures sent. CT from Friday did not show a leak. We probably need to repeat that early this week. WBC normalized Granix received 02/01/18 per Dr. Alvy Bimler.   Vancomycin stopped on 02/04/18 and then fevers began again. Restarted Vancomycin on 02/06/18. Anidulafungin initiated 02/01/18.  Meropenum IV started 01/31/18.  Blood cultures from 01/31/18 & 02/06/18 with no growth.  Tracheal aspirate from 02/01/18 resulting few gram positive rods. Repeated 02/06/18 showed few yeast.  IR feels no safe window for drainage of pelvic fluid collection.  Repeat imaging showed improvement.  Appreciate ID input (no changes to regimen recommended at this time).  Wound culture ?9/5 sample? Which is negative to date.  Wound repacked 02/13/18 with granulation tissue noted and no evidence of purulence or enteric contents. Nursing will change dressing in evening once daily and we will change in the morning to track progress. Will see if we can get wound care again Monday about VAC  CV: Tachycardia per CC BP sensitive with changes in sedation. Telemetry, diuresis per CC, lopressor, and labetalol ordered PRN.  Resp: ARDS in setting of aspiration pneumonia and sepsis.   Chest 1 view  9/6 in afternoon: IMPRESSION: 1. No significant change compared to the recent chest x-rays. Persistent opacities throughout the RIGHT lung, consistent with multifocal pneumonia versus asymmetric pulmonary edema. Small bilateral pleural effusions. 2. Tubes and lines appear adequately positioned.  - Weaning per CC.  - Tracheostomy per CC  GI:   CT AP on 01/31/18 with findings consistent with GI perforation.  No obvious perforation identified on re-operation on 01/31/18.  PPI ordered. GI consultation appreciated.  TPN initiated 02/02/18 and discontinued 02/10/18.  Tube feedings running continuously at 50cc/hr.   Tolerating tube feeds with loose bowel movements in Flexiseal Repeat CT 9/6 showed no worsening (in fact decrease size)  of pelvic collection. No leak 02/13/18 - ? Enteric versus old hematoma contents in LLQ JP We will repeat imaging to be sure  GU: AKI post-op resolved.  Foley in place with yellow urine.  Diuresis per CC.  FEN:  IVF per CC Replacement if needed per CC.   Prophylaxis: SCDs in place. Continue Lovenox prophylaxis. No active bleeding and H&H stable.  Oncologic Goals - discussed with family and clarified if she did recover we would consider chemotherapy, however the bigger question is how long and to what extent she will recover. Several issues remain 1. Fever source - suspect pelvic / ? GI 2. Ventilator support - ICU advises decision needs to be made about trach. Family needs to discuss and consider with ICU risks/benefits of placing trach versus continued endotrach intubation.  3. ? GI leak - If there is a leak that will effect long term plans. We discussed this a bit today and this may need further workup. This will cause even more delay in recovery and would be another negative for her in regards to possible recovery.  4. Debilitation - As it is she is too debilitated for chemo now. If all things were improving and she was simply looking now at [facility] or rehab for vent / trach needs she may be able to improve performance status for meaningful recovery. She is still ill though and it is hard to predict her time to achieve goals.    Plan: Continue care per Critical Care.  Abdominal wound  Does not appear infected 02/12/18. Seems cultures done 9/5 from "abdomen" have been NGTD Continue with wet to dry dressing changes to  the opening in the lower aspect of the incision twice daily.  Will ask about VAC again this week  Continue tube feeds for now, however JP drain culture sent. Will plan repeat imaging to look for leak given the appearance of the drainage today. If + for leak may need to stop tube feeds and back to TPN.   I have asked Dr. Alvy Bimler to revisit the family to discuss her opinion  regarding treatment goals and what timeline family should look to for meaningful recovery that further treatment would be considered.  Jo Parker 02/13/2018, 12:44 PM

## 2018-02-13 NOTE — Progress Notes (Addendum)
PMT no charge note  Palliative consult request received.  Chart reviewed, discussed with bedside RN Judson Roch, discussed with PCCM MD Dr Vaughan Browner.  I met with the patient's daughter in law in the patient's room, introduced myself and palliative care.  I have given the patient's daughter in law my card, requesting call back from patient's son, to finalize time for family meeting, tentatively scheduled for 02-14-18 at 41 or 11 AM, so that a PMT provider may be able to meet with them to offer supportive care and assistance with decision making.  Full note and final recommendations to follow Thank you for the consult.   Loistine Chance MD [facility] health palliative medicine team [phone]

## 2018-02-14 ENCOUNTER — Inpatient Hospital Stay (HOSPITAL_COMMUNITY): Payer: Medicare PPO

## 2018-02-14 ENCOUNTER — Encounter (HOSPITAL_COMMUNITY): Payer: Self-pay | Admitting: Radiology

## 2018-02-14 LAB — CREATININE, SERUM
CREATININE: 0.53 mg/dL (ref 0.44–1.00)
GFR calc Af Amer: >60 mL/min (ref >60)
GFR calc non Af Amer: >60 mL/min (ref >60)

## 2018-02-14 LAB — CBC WITH DIFFERENTIAL/PLATELET
Basophils Absolute: 0 10*3/uL (ref 0.0–0.1)
Basophils Relative: 0 %
EOS ABS: 0.2 10*3/uL (ref 0.0–0.7)
EOS PCT: 2 %
HCT: 28.1 % — ABNORMAL LOW (ref 36.0–46.0)
HEMOGLOBIN: 8.6 g/dL — AB (ref 12.0–15.0)
LYMPHS PCT: 17 %
Lymphs Abs: 1.7 10*3/uL (ref 0.7–4.0)
MCH: 24.9 pg — ABNORMAL LOW (ref 26.0–34.0)
MCHC: 30.6 g/dL (ref 30.0–36.0)
MCV: 81.4 fL (ref 78.0–100.0)
MONO ABS: 0.7 10*3/uL (ref 0.1–1.0)
Monocytes Relative: 7 %
NEUTROS ABS: 7.4 10*3/uL (ref 1.7–7.7)
Neutrophils Relative %: 74 %
Platelets: 275 10*3/uL (ref 150–400)
RBC: 3.45 MIL/uL — ABNORMAL LOW (ref 3.87–5.11)
RDW: 24.4 % — ABNORMAL HIGH (ref 11.5–15.5)
WBC: 10 10*3/uL (ref 4.0–10.5)

## 2018-02-14 LAB — AEROBIC CULTURE  (SUPERFICIAL SPECIMEN)

## 2018-02-14 LAB — GLUCOSE, CAPILLARY
GLUCOSE-CAPILLARY: 95 mg/dL (ref 70–99)
Glucose-Capillary: 87 mg/dL (ref 70–99)
Glucose-Capillary: 75 mg/dL (ref 70–99)
Glucose-Capillary: 76 mg/dL (ref 70–99)
Glucose-Capillary: 78 mg/dL (ref 70–99)
Glucose-Capillary: 82 mg/dL (ref 70–99)

## 2018-02-14 LAB — AEROBIC CULTURE W GRAM STAIN (SUPERFICIAL SPECIMEN)

## 2018-02-14 MED ORDER — ACETAMINOPHEN 10 MG/ML IV SOLN
1000.0000 mg | Freq: Four times a day (QID) | INTRAVENOUS | Status: AC
  Administered 2018-02-14 – 2018-02-15 (×2): 1000 mg via INTRAVENOUS
  Filled 2018-02-14 (×4): qty 100

## 2018-02-14 MED ORDER — IOPAMIDOL (ISOVUE-300) INJECTION 61%
INTRAVENOUS | Status: AC
  Filled 2018-02-14: qty 30

## 2018-02-14 MED ORDER — IOPAMIDOL (ISOVUE-300) INJECTION 61%
INTRAVENOUS | Status: AC
  Administered 2018-02-14: 15 mL
  Filled 2018-02-14: qty 30

## 2018-02-14 MED ORDER — IOPAMIDOL (ISOVUE-300) INJECTION 61%
15.0000 mL | Freq: Once | INTRAVENOUS | Status: DC | PRN
  Administered 2018-02-14: 15 mL via ORAL
  Filled 2018-02-14: qty 30

## 2018-02-14 MED ORDER — ACETAMINOPHEN 10 MG/ML IV SOLN
1000.0000 mg | Freq: Four times a day (QID) | INTRAVENOUS | Status: DC
  Filled 2018-02-14: qty 100

## 2018-02-14 NOTE — Progress Notes (Signed)
PMT no charge note  Our PMT RN received a call from the patient's family earlier this am, stating that they did not need to meet with our team at this time.   We will remain available for further conversations/supportive care in the future.   Loistine Chance MD [facility] health palliative medicine team [phone]

## 2018-02-14 NOTE — Progress Notes (Signed)
Gynecologic Oncology Progress Note  72 year old female diagnosed with Stage IV ovarian cancer who received four cycles of neoadjuvant chemotherapy with carboplatin and taxol with fourth cycle completed on 12/20/17 .  She underwent interval debulking surgery with Dr. Skeet Latch on 01/27/18 consisting of an exploratory laparotomy infra-gastric omentectomy bilateral salpingo-oophorectomy radical optimal tumor debulking, resection of anterior abdominal wall.  Her post-operative course was complicated by altered mental status noted on POD1 with improvement after Narcan administration but with return of symptoms, acute renal injury, tachycardia, dyspnea (CT chest performed 01/30/18-neg for PE), anemia.  Her AKI and altered mental status appeared to improve on 01/30/18 in the am but symptoms worsened the evening of 01/30/18 with episodes of emesis, tachycardia, hypertension, and confusion. A CT scan on 01/31/18 revealed findings most consistent with ischemic bowel with portal venous gas and bowel pneumatosis and large amount of pneumoperitoneum with concerns for peritonitis.  She was taken back to the OR on 01/31/18 with operative findings of free air and large volume ascites/fluid, but unable to visualize site of bowel perforation, no evidence of ischemia. It was felt that aspiration most likely occurred during intubation.        Subjective: Patient intubated but alert, eyes open, turning head.  Son and husband at the bedside.  Objective: Vital signs in last 24 hours: Temp:  [100 F (37.8 C)-101.7 F (38.7 C)] 101.1 F (38.4 C) (09/09 0845) Pulse Rate:  [101-120] 101 (09/09 0845) Resp:  [17-26] 23 (09/09 0845) BP: (92-162)/(40-78) 142/57 (09/09 0700) SpO2:  [98 %-100 %] 99 % (09/09 0845) FiO2 (%):  [30 %] 30 % (09/09 0817) Weight:  [128 lb 15.5 oz (58.5 kg)] 128 lb 15.5 oz (58.5 kg) (09/09 0442) Last BM Date: 02/12/18  Intake/Output from previous day: 09/08 0701 - 09/09 0700 In: 1291.1 [I.V.:164.3;  NG/GT:503.2; IV Piggyback:623.7] Out: 1980 [Urine:1675; Emesis/NG output:275; Drains:30]  Physical Examination: General: appears comfortable, alert, turning head to direction of stimuli. Cardio: Regular rhythm, mildly tachycardic intermittently Resp: Coarse breath sounds but clear. On vent GI: Abdomen minimally distended, active bowel sounds.  Flexiseal in place with liquid brown stool.  Abdominal dressing dry and intact.  Dressing and packing removed without difficulty.  Wound is measuring 3 cm in width, 4 cm in length, and 3 cm in depth with tunneling under the skin bridge to the higher superficial defect. Again no malodor or drainage concerning for fistula in the wound or on the removed packing. The subcutaneous tissue shows granulation. The fascia is intact on palpation and without granulation tissue.  Wound repacked with dampened normal saline kerlix gauze.   LLQ JP showing brown to greenish drainage (around 3-5 cc in the charged bulb) appearing contents.  There is no leaking around the tubing entry site. Prior JP sites are healing with no drainage or erythema. Upper aspect of the incision with steri strips in place. Extremities: generalized edema   Labs: WBC/Hgb/Hct/Plts:  10.0/8.6/28.1/275 (09/09 1610) BUN/Cr/glu/ALT/AST/amyl/lip:  --/0.53/--/--/--/--/-- (09/09 9604)  Assessment: 72 y.o. s/p Procedure(s): Interval debulking surgery with Dr. Skeet Latch on 01/27/18 consisting of an exploratory laparotomy infra-gastric omentectomy bilateral salpingo-oophorectomy radical optimal tumor debulking, resection of anterior abdominal wall.  Reoperation of EXPLORATORY LAPAROTOMY, ABDOMINAL WASHOUT on 01/31/18:   Heme:  Post-operative anemia. Stable currently. Last transfusion 02/02/18.   Anemia related to acute critical illness, no evidence of active bleeding.   ID: Fever continues with max temp of 101.7. Presumed sepsis initially from gastrointestinal source but unable to ID the perforation after exp  lap  on 01/31/18. Contribution now from pneumonia and possibly undrained left pelvic fluid collection seen on CT scan 02/07/18; however the pelvic collection is improving. 02/13/18 enteric versus old hematoma appearing contents in JP. Cultures pending. CT from Friday did not show a leak. Repeat CT today. WBC normalized at 10. Granix received 02/01/18 per Dr. Alvy Bimler.   Vancomycin stopped on 02/04/18 and then fevers began again. Restarted Vancomycin on 02/06/18. Anidulafungin initiated 02/01/18.  Meropenum IV started 01/31/18.  Blood cultures from 01/31/18 & 02/06/18 with no growth.  Tracheal aspirate from 02/01/18 resulting few gram positive rods. Repeated 02/06/18 showed few yeast.  IR feels no safe window for drainage of pelvic fluid collection.  Repeat imaging showed improvement.  Appreciate ID input (no changes to regimen recommended at this time).  Wound culture from abdominal incision with no growth to date.  CV: Tachycardia per CC BP sensitive with changes in sedation. Telemetry, diuresis per CC, lopressor, and labetalol ordered PRN.  Resp: ARDS in setting of aspiration pneumonia and sepsis.   Chest 1 view 9/9 in afternoon: IMPRESSION: Slight improvement in the aeration of the lungs with patchy bilateral airspace opacities. - Weaning per CC.  - Consideration for Tracheostomy per CC  GI:   CT AP on 01/31/18 with findings consistent with GI perforation.  No obvious perforation identified on re-operation on 01/31/18.  PPI ordered. GI consultation appreciated.  TPN initiated 02/02/18 and discontinued 02/10/18.  Tube feedings were running continuously at 50cc/hr but on hold at this time awaiting CT scan results.  Loose bowel movements in Flexiseal Repeat CT 9/6 showed no worsening (in fact decrease size) of pelvic collection. No leak 02/13/18 -Enteric versus old hematoma contents in LLQ JP. CT with rectal contrast and oral contrast this am. JP fluid culture from 02/13/18:  FEW WBC PRESENT, PREDOMINANTLY PMN   MODERATE GRAM NEGATIVE RODS FEW GRAM POSITIVE RODS  GU: AKI post-op resolved.  Foley in place with yellow urine.  Diuresis per CC.  FEN:  IVF per CC Replacement if needed per CC.   Prophylaxis: SCDs in place. Continue Lovenox prophylaxis. No active bleeding and H&H stable.  Plan: CT this am. Continue care per Critical Care and Dr. Gerarda Fraction. Continue wet to dry dressing changes twice daily.  Hold on consideration of negative pressure wound therapy at this time.     Kentley Blyden D Shelbee Apgar 02/14/2018, 10:42 AM

## 2018-02-14 NOTE — Progress Notes (Signed)
Pt transported to CT and back on 100% with Ventilator.

## 2018-02-14 NOTE — Care Management (Signed)
  Discharge Readiness Milestones    MCG Used: resp. failure Optimal GLOS: extended stay Hospital Day: 18  Discharge Readiness Milestones  Identified Barriers:72 year old female diagnosed with Stage IV ovarian cancer who received four cycles of neoadjuvant chemotherapy with carboplatin and taxol with fourth cycle completed on 12/20/17 .  She underwent interval debulking surgery with Dr. Skeet Latch on 01/27/18 consisting of an exploratory laparotomy infra-gastric omentectomy bilateral salpingo-oophorectomy radical optimal tumor debulking, resection of anterior abdominal wall.  Her post-operative course was complicated by altered mental status noted on POD1 with improvement after Narcan administration but with return of symptoms, acute renal injury, tachycardia, dyspnea (CT chest performed 01/30/18-neg for PE), anemia.  Her AKI and altered mental status appeared to improve on 01/30/18 in the am but symptoms worsened the evening of 01/30/18 with episodes of emesis, tachycardia, hypertension, and confusion. A CT scan on 01/31/18 revealed findings most consistent with ischemic bowel with portal venous gas and bowel pneumatosis and large amount of pneumoperitoneum with concerns for peritonitis.  She was taken back to the OR on 01/31/18 with operative findings of free air and large volume ascites/fluid, but unable to visualize site of bowel perforation, no evidence of ischemia. It was felt that aspiration most likely occurred during intubation.   Date/Name case discussed with attending MD note placed in system concerning poss ltach admission for continued care.:  Date/name case discussed with supervisor (if applicable):  Date/name case referred to physician advisor (if applicable)   Concurrent Review - Discharge Readiness Milestones, Not Met

## 2018-02-14 NOTE — Progress Notes (Signed)
Patient ID: Jo Parker, female   DOB: [birth year], 72 y.o.   MRN: [id number]         [facility] for Infectious Disease  Date of Admission:  01/27/2018           Day 15 meropenem        Day 14 vancomycin        Day 14 anidulafungin ASSESSMENT: High fevers continue despite very broad antimicrobial therapy for intra-abdominal infection, wound infection and pneumonia.  Wound cultures grew lactobacillus which represents a skin colonizer.  Gram stain of fluid from her JP drain showed gram-negative rods and gram-positive rods.  Cultures are pending.  Repeat CT scan today showed that the pelvic fluid collection might be slightly smaller.  PLAN: 1. Continue current antimicrobial therapy 2. Await results of cultures from her JP drain  Active Problems:   Severe sepsis ([facility])   Peritonitis ([place])   HCAP (healthcare-associated pneumonia)   Postoperative wound infection   Primary peritoneal carcinomatosis ([facility])   Ovarian cancer ([facility])   Status post bilateral salpingo-oophorectomy   Severe protein-calorie malnutrition ([name])   COPD (chronic obstructive pulmonary disease) ([name])   HLD (hyperlipidemia)   Absolute anemia   White coat syndrome with hypertension   AKI (acute kidney injury) ([facility])   Acute metabolic encephalopathy   Acute respiratory distress   Low urine output   Somnolence   At risk for fluid volume overload   Free intraperitoneal air   Acute respiratory failure ([name])   Pressure injury of skin   Aspiration pneumonia ([name])   Scheduled Meds: . chlorhexidine gluconate (MEDLINE KIT)  15 mL Mouth Rinse BID  . citalopram  20 mg Per Tube Daily  . enoxaparin (LOVENOX) injection  40 mg Subcutaneous Q24H  . free water  100 mL Per Tube Q8H  . furosemide  40 mg Intravenous Q12H  . insulin aspart  0-20 Units Subcutaneous Q4H  . insulin aspart  4 Units Subcutaneous Q4H  . iopamidol      . ipratropium-albuterol  3 mL Nebulization TID  . mouth rinse  15 mL Mouth Rinse 10 times per  day  . metoprolol tartrate  5 mg Intravenous Q6H  . pantoprazole (PROTONIX) IV  40 mg Intravenous Q12H  . QUEtiapine  25 mg Per Tube Daily  . QUEtiapine  50 mg Oral QHS  . sodium chloride flush  10-40 mL Intracatheter Q12H   Continuous Infusions: . sodium chloride Stopped (02/14/18 1051)  . acetaminophen Stopped (02/14/18 0926)  . anidulafungin Stopped (02/14/18 1105)  . dextrose 10 mL/hr at 02/14/18 1300  . feeding supplement (VITAL AF 1.2 CAL) 1,000 mL (02/13/18 0753)  . meropenem (MERREM) IV Stopped (02/14/18 0930)  . vancomycin Stopped (02/14/18 1400)   PRN Meds:.sodium chloride, acetaminophen (TYLENOL) oral liquid 160 mg/5 mL, fentaNYL (SUBLIMAZE) injection, hydrALAZINE, iopamidol, labetalol, sodium chloride flush  Review of Systems: Review of Systems  Unable to perform ROS: Intubated    Allergies  Allergen Reactions  . Oxycodone     confusion    OBJECTIVE: Vitals:   02/14/18 1300 02/14/18 1400 02/14/18 1410 02/14/18 1415  BP: 130/60 134/65    Pulse: 94 95  96  Resp: (!) 26 (!) 21  (!) 23  Temp: 99.7 F (37.6 C) 99.7 F (37.6 C)  99.9 F (37.7 C)  TempSrc:      SpO2: 100%  100% 100%  Weight:      Height:       Body mass index is 22.85 kg/m.  Physical Exam  Constitutional:  She remains sedated on the ventilator.    Cardiovascular: Normal rate, regular rhythm and normal heart sounds.  No murmur heard. Pulmonary/Chest: She has no wheezes. She has no rales.  Abdominal: Soft. She exhibits no distension.  No more purulent drainage from her abdominal wound noted at the time of dressing change this morning.  Skin: No rash noted.  IV sites look okay.    Lab Results Lab Results  Component Value Date   WBC 10.0 02/14/2018   HGB 8.6 (L) 02/14/2018   HCT 28.1 (L) 02/14/2018   MCV 81.4 02/14/2018   PLT 275 02/14/2018    Lab Results  Component Value Date   CREATININE 0.53 02/14/2018   BUN 30 (H) 02/12/2018   NA 144 02/12/2018   K 3.3 (L) 02/12/2018   CL  91 (L) 02/12/2018   CO2 41 (H) 02/12/2018    Lab Results  Component Value Date   ALT 69 (H) 02/12/2018   AST 60 (H) 02/12/2018   ALKPHOS 214 (H) 02/12/2018   BILITOT 0.7 02/12/2018     Microbiology: Recent Results (from the past 240 hour(s))  Body fluid culture     Status: None   Collection Time: 02/05/18 11:54 AM  Result Value Ref Range Status   Specimen Description   Final    PERITONEAL INTRAPERITONEAL DRAIN 3 Performed at [facility] [address] [place], [place] [address]    Special Requests   Final    NONE Performed at [facility], [place] [address] [place], [place] [address]    Gram Stain   Final    RARE WBC PRESENT, PREDOMINANTLY PMN NO ORGANISMS SEEN    Culture   Final    NO GROWTH 3 DAYS Performed at [facility] [address] [place], [place] [address]    Report Status 02/08/2018 FINAL  Final  Culture, blood (routine x 2)     Status: None   Collection Time: 02/06/18  2:42 AM  Result Value Ref Range Status   Specimen Description   Final    BLOOD BLOOD RIGHT HAND Performed at [facility] [address], [place], [place] [address]    Special Requests   Final    BOTTLES DRAWN AEROBIC ONLY Blood Culture results may not be optimal due to an inadequate volume of blood received in culture bottles Performed at [facility] [address], [place], [place] [address]    Culture   Final    NO GROWTH 5 DAYS Performed at [facility], [place] [address] [place], [place] [address]    Report Status 02/11/2018 FINAL  Final  Urine Culture     Status: None   Collection Time: 02/06/18 10:40 AM  Result Value Ref Range Status   Specimen Description   Final    urine Performed at [facility] [address] [place], [place] [address]    Special Requests   Final    NONE Performed at [facility], [place] [address], [place], [place] [address]      Culture   Final    NO GROWTH Performed at [facility], [place] [address] [place], [place] [address]    Report Status 02/07/2018 FINAL  Final  Culture, respiratory (non-expectorated)     Status: None   Collection Time: 02/06/18 11:44 AM  Result Value Ref Range Status   Specimen Description   Final  TRACHEAL ASPIRATE TRACHEAL ASPIRATE Performed at [facility] [address], [place], [place] [address]    Special Requests   Final    NONE Performed at [facility], [place] [address], [place], [place] [address]    Gram Stain   Final    FEW WBC PRESENT, PREDOMINANTLY PMN RARE YEAST Performed at [facility], [place] [address], [place], [place] [address]    Culture FEW CANDIDA ALBICANS FEW CANDIDA GLABRATA   Final   Report Status 02/09/2018 FINAL  Final  Aerobic Culture (superficial specimen)     Status: None   Collection Time: 02/10/18 10:56 AM  Result Value Ref Range Status   Specimen Description   Final    ABDOMEN Performed at [facility], [place] [address], [place], [place] [address]    Special Requests   Final    NONE Performed at [facility], [place] [address], [place], [place] [address]    Gram Stain   Final    RARE WBC PRESENT, PREDOMINANTLY PMN NO ORGANISMS SEEN    Culture   Final    FEW LACTOBACILLUS SPECIES Standardized susceptibility testing for this organism is not available. Performed at [facility], [place] [address], [place], [place] [address]    Report Status 02/14/2018 FINAL  Final  Aerobic/Anaerobic Culture (surgical/deep wound)     Status: None (Preliminary result)   Collection Time: 02/13/18 11:44 AM  Result Value Ref Range Status   Specimen Description JP DRAINAGE  Final   Special Requests NONE  Final   Gram Stain   Final    FEW WBC PRESENT, PREDOMINANTLY PMN MODERATE GRAM NEGATIVE RODS FEW GRAM POSITIVE RODS    Culture   Final    CULTURE REINCUBATED FOR  BETTER GROWTH Performed at [facility], [place] [address], [place],  [address]    Report Status PENDING  Incomplete    Michel Bickers, MD [facility] [phone] pager   [phone] cell 02/14/2018, 3:08 PM

## 2018-02-14 NOTE — Progress Notes (Signed)
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  She is completed 3 cycles of chemotherapy and is now status post bilateral salpingo-oophorectomy and omentectomy with debulking on 8/22.   8/22 bilateral salpingo-nephrectomy omentectomy and debulking surgery  Internal medicine 8/25 Critical care 8/26 General surgery 8/26 8/26 transferred to ICU w/ concern for perforation w/ free air noted.  Imipenem started. Went for exploratory laparotomy with washout.  No significant contamination identified, presumed spontaneously closed microperf. Returned with abdominal drains postoperative chest x-ray showing evidence of aspiration pneumonia; mixed respiratory and metabolic acidosis & in septic shock.    CT abdomen 8/26: Hemic bowel with portal venous gas and bowel pneumatosis with large amount of pneumoperitoneum.  Unclear as to site of perforation.  Multiple pockets of air adjacent to the sigmoid colon.  Enhancement of the peritoneum concerning for peritonitis.  There was aortic arterial sclerosis.  8/26 Intubation  8/26 Exploratory laparotomy, washout, placement of right upper quadrant x2 JP drain and left lower quadrant JP drain  GI medicine 8/27 > did not feel she had overt GIB.  Rec's prophylaxis with PPI.  Signed off. Interventional radiology cons: 9/3 felt left fluid collection was anatomically too risky to drain   Echocardiogram 8/27: Particular ejection fraction 65 to 70%.  She has moderate LVH, and hyperdynamic LV function.  There is grade 1 diastolic dysfunction.  Trivial MR.  Normal left atrial size.  The IV C was not visualized   8/27: weaning pressors. Hgb dropped from 8.8 to 7.4; got 2 units of blood,  febrile, added. vancomycin.  Neupogen  given for neutropenia.  8/28: Pressors off/shock resolved. NAG acidosis and Lactic acidosis resolved.  FiO2/PEEP down, attempted PSV.  WBC count up.  Started TPN.  8/29: FiO2 and PEEP requirements increased, had wean these down to 40/5 now 60/10.  Chest x-ray with worsening right-sided airspace disease likely reflecting de-recruitment. Delirium remains significant obstacle.  Beta-blocker, and clonidine patch added for hypertension  8/30: Diuresed 4.5 L the day prior with Lasix.  Little more awake.  X-ray improved.  Vancomycin discontinued.  Continuing diuresis, RASS goal changed to -1, dropping PEEP to 8. 9/01:  Vanco added back with fevers, clonidine patch 9/02-9/6: Delirium persists. Sedation difficult, diuresing, remains on abx. spiking temps, loculated fluid collection identified in left pelvis. Not amedable to drainage.  ID consulted. Started seroquel. 9/4. Upper 2 drains removed. Staples removed.from incision site.   CT abdomen 9/2 >> drainage catheters within the pelvis, RUQ, LUQ.  Significant interval decrease in size of previously described intra-abdominal fluid, no residual free intraperitoneal air. There is a persistent loculated fluid & gas collection along the left pelvic sidewall measuring up to 10 cm.  Moderate bilateral pleural effusions, underlying opacities favored to represent atelectasis.  Marked wall thickening of the sigmoid colon.  Peripheral low attenuation within the spleen concerning for splenic infarcts.  Wall thickening of the urinary bladder.     9/4: Infectious disease.  Consulted by family.  Had no additional recommendations   CT abdomen 9/6: Improving left lateral pelvic fluid collection, resolved peritoneal fluid in the left lower abdomen.  Concentric thickening of the sigmoid colon.  Decreased bilateral effusion and atelectasis.   9/6: WBC curve normalized but still having low gd fever and purulent drainage from  wound.  CT shows abd fluid collection  better  9/7-9/8: Failing pressure support weans.    Subjective/interval events   02/14/2018 - febrile 101F. WBC ok. Not on pressors. Not on sedation gtt. Tracks per [employer]. Husband at bedside - says he is newly (2y) married to patient and so defers decision to patient's biologic son. Leak noticed in abd drain and with fevers -> perf suspected and going for CT abd  Objective    Blood pressure (!) 142/57, pulse (!) 101, temperature (!) 101.1 F (38.4 C), resp. rate (!) 23, height 5\' 3"  (1.6 m), weight 58.5 kg, SpO2 99 %.    Vent Mode: PRVC FiO2 (%):  [30 %] 30 % Set Rate:  [20 bmp] 20 bmp Vt Set:  [330 mL] 330 mL PEEP:  [5 cmH20] 5 cmH20 Plateau Pressure:  [10 cmH20-22 cmH20] 10 cmH20   Intake/Output Summary (Last 24 hours) at 02/14/2018 0951 Last data filed at 02/14/2018 0600 Gross per 24 hour  Intake 1068.24 ml  Output 1880 ml  Net -811.76 ml   Filed Weights   02/10/18 0400 02/11/18 0600 02/14/18 0442  Weight: 62.2 kg 60.2 kg 58.5 kg     EXAM  General Appearance:  Looks criticall ill, Emaciated and malnourished Head:  Normocephalic, without obvious abnormality, atraumatic Eyes:  PERRL - yes, conjunctiva/corneas - clear     Ears:  Normal external ear canals, both ears Nose:  G tube - no Throat:  ETT TUBE - yes , OG tube - yes, TF on hold Neck:  Supple,  No enlargement/tenderness/nodules Lungs: Clear to auscultation bilaterally, Ventilator   Synchrony - yes Heart:  S1 and S2 normal, no murmur, CVP - no.  Pressors - no Abdomen:  Soft, no masses, no organomegaly Genitalia / Rectal:  Not done Extremities:  Extremities- intact Skin:  ntact in exposed areas .  Neurologic:  Sedation - prn -> RASS - +1 . Moves all 4s - yes. CAM-ICU - positive for delirium . Orientation - not oriented        Labs    PULMONARY Recent Labs  Lab 02/08/18 1018  PHART 7.330*  PCO2ART 81.6*  PO2ART 79.9*  HCO3 41.9*  O2SAT 94.1    CBC Recent Labs  Lab 02/12/18 0629 02/13/18 0550  02/14/18 0437  HGB 9.8* 9.2* 8.6*  HCT 31.6* 29.9* 28.1*  WBC 9.3 10.5 10.0  PLT 278 277 275    COAGULATION No results for input(s): INR in the last 168 hours.  CARDIAC   Recent Labs  Lab 02/07/18 1740 02/07/18 2314 02/08/18 0514  TROPONINI 0.05* 0.04* 0.05*   No results for input(s): PROBNP in the last 168 hours.   CHEMISTRY Recent Labs  Lab 02/07/18 1740 02/08/18 0514 02/09/18 0400 02/10/18 0302 02/12/18 0629 02/14/18 0437  NA 138 137 140 141 144  --   K 4.0 4.0 4.4 4.0 3.3*  --   CL 86* 86* 90* 91* 91*  --   CO2 39* 37* 37* 39* 41*  --   GLUCOSE 149* 142* 182* 222* 142*  --   BUN 29* 31* 32* 38* 30*  --   CREATININE 0.61 0.64 0.60 0.68 0.64 0.53  CALCIUM 7.7* 7.9* 7.9* 8.0* 8.4*  --   MG 2.1 1.9  --  1.8  --   --   PHOS  --   --   --  5.2*  --   --    Estimated Creatinine Clearance: 52.6 mL/min (by C-G formula based on SCr of  0.53 mg/dL).   LIVER Recent Labs  Lab 02/10/18 0302 02/12/18 0629  AST 98* 60*  ALT 80* 69*  ALKPHOS 218* 214*  BILITOT 0.4 0.7  PROT 6.0* 6.8  ALBUMIN 1.5* 1.6*     INFECTIOUS No results for input(s): LATICACIDVEN, PROCALCITON in the last 168 hours.   ENDOCRINE CBG (last 3)  Recent Labs    02/13/18 2323 02/14/18 0321 02/14/18 0804  GLUCAP 80 87 75         IMAGING x48h  - image(s) personally visualized  -   highlighted in bold No results found.   Micro Data: Blood Culture x2 8/26 >>neg Sputum 8/27: Abundant yeast, few Candida albicans Blood Cx 9/1 >>  Respiratory 9/1 >> few Candida Urine 9/1 >> negative Peritoneal fluid 8/31 >> neg  Antimicrobials:  Meropenem 8/26 >> Vancomycin 8/27 >> 8/30; 9/1 >>  Eraxis 8/27 >>      Assessment & Plan:   Status post exploratory laparotomy for free air on 8/26 ; following bilateral salpingo-oophorectomy, omentectomy, and debulking surgery on 8/22  Plan  - TF on hold - per CCS and GYn ONC   Sepsis, fever with neutropenia: peritonitis  (microperforation), complicated by aspiration pneumonia.  -ongoing fevers, 9/2 CT Abd concerning for loculated fluid collection->IR felt too risky for drainage anatomically  - 02/14/2018 - concern for  Micro perf  Plan Day #15 vancomycin and meropenem day #13 Eraxis  Await repeat CT results   Acute hypoxic respiratory failure in the setting of aspiration pneumonia/ARDS Right pleural effusion- No sig pocket to drain on bedside US 9/5 Delirium, deconditioning, malnutrition continues to be her major barrier to weaning   - 02/14/2018 - meets indication for trach v comfort care depending on goals. . Unable to liberate due to delirium, deconditioning and extreme wasting   Plan Full vent support SBT as tolerated   Sinus tachycardia, hypertension, and grade 1 diastolic dysfunction with minimal troponin I bump.   Bradycardia - periods of brady to the 30's Labile BP (w/episodes of hypotension seemingly exacerbated by sedation) -Echo showed hyperdynamic ejection fraction with no wall motion abnormality  Plan Lopressor with hold parameters PRN labetalol Continue diuresis Continue telemetry monitoring  Acute metabolic encephalopathy.    - 02/14/18 - ongoing : due to  multifactorial and secondary to sepsis as well as residual narcotics and metabolic acidosis, this remains a significant barrier.    Plan RASS goal 0 seroquel paxil Off Precedex.  At risk for fluid and electrolyte imbalance: Anasarca Plan Trend BMP  Severe Protein Calorie Malnutrition, prealbumin 3.5 Plan Continue tube feeds  Postoperative anemia.  Hemoglobin stable Plan CBC, continue PPI  Leukocytosis.  Initially was neutropenic however did receive Granix on 8/28 per oncology; has normalized once again as of 9/6 Plan Continue to trend CBC  Thrombocytopenia; stable; improved Plan Trend CBC  Hyperglycemia Plan Continue current sliding scale and NovoLog coverage  Stage IV ovarian cancer, now status post  bilateral salpingo-oophorectomy omentectomy and debulking surgery on 8/22 Plan Per GYN oncology  - including goals of care and directions on chemo    Best practice/Goals of care  DVT prophylaxis: LMWH GI prophylaxis: Changed to PPI on 8/27 Diet: NPO, starting TPN on 8/28 - stopped ? Date. Mobility: BR Code Status: Full Code Family Communication: husband updated 02/14/2018   Disposition / Summary of Today's Plan 02/14/18   Continue full vent support. Her ECOG is 4 and with just critical illness alone she has now transitioned to chronic critical illness and need  trach and LTACH. To recover from this alone could be weeks or months. Currently too sick to handle any chemo in judgement of [place]   The patient is critically ill with multiple organ systems failure and requires high complexity decision making for assessment and support, frequent evaluation and titration of therapies, application of advanced monitoring technologies and extensive interpretation of multiple databases.   Critical Care Time devoted to patient care services described in this note is  30  Minutes. This time reflects time of care of this signee Dr Brand Males. This critical care time does not reflect procedure time, or teaching time or supervisory time of PA/NP/Med student/Med Resident etc but could involve care discussion time     Dr. Brand Males, M.D., [facility].C.P Pulmonary and Critical Care Medicine Staff Physician [place] Pager: [phone], If no answer or between  15:00h - 7:00h: call [phone]  02/14/2018 10:04 AM

## 2018-02-14 NOTE — Progress Notes (Signed)
S: Patient opening eyes at times to stimuli.  O: Vitals:   02/14/18 0845 02/14/18 1100 02/14/18 1211 02/14/18 1300  BP:  (!) 136/51  130/60  Pulse: (!) 101 (!) 105 (!) 107 94  Resp: (!) 23 (!) 26 (!) 22 (!) 26  Temp: (!) 101.1 F (38.4 C) 100.2 F (37.9 C)  99.7 F (37.6 C)  TempSrc:      SpO2: 99% 100% 99% 100%  Weight:      Height:       Abdomen - mild distended, NABS. JP with residual brownish fluid in bulb but serous appearing fluid in tubing and small fibrinous exudate "column" seen Wound changed this am by M. Cross with granulation tissue seen. No leaking around JP site. Urine clear Large amount of liquid stool in rectal tube.  Plan See detailed plan on M.Cross note, Continue wet to dry on wound for now. In addition reviewed CT with family. No leak seen. We will see if we can use oral charcoal for further assessment.  Restart tube feeds. Keep JP Discussed if family intended DNR back to full code and they stated that was their wish for now and to await further discussions for long-term goals of care with Dr. Alvy Bimler.

## 2018-02-14 NOTE — Progress Notes (Signed)
Per Dr. Gerarda Fraction okay to give medication via NG tube

## 2018-02-15 DIAGNOSIS — J9600 Acute respiratory failure, unspecified whether with hypoxia or hypercapnia: Secondary | ICD-10-CM

## 2018-02-15 DIAGNOSIS — B999 Unspecified infectious disease: Secondary | ICD-10-CM

## 2018-02-15 DIAGNOSIS — D638 Anemia in other chronic diseases classified elsewhere: Secondary | ICD-10-CM

## 2018-02-15 DIAGNOSIS — N739 Female pelvic inflammatory disease, unspecified: Secondary | ICD-10-CM | POA: Diagnosis present

## 2018-02-15 LAB — GLUCOSE, CAPILLARY
GLUCOSE-CAPILLARY: 103 mg/dL — AB (ref 70–99)
GLUCOSE-CAPILLARY: 109 mg/dL — AB (ref 70–99)
GLUCOSE-CAPILLARY: 154 mg/dL — AB (ref 70–99)
GLUCOSE-CAPILLARY: 78 mg/dL (ref 70–99)
GLUCOSE-CAPILLARY: 96 mg/dL (ref 70–99)
Glucose-Capillary: 85 mg/dL (ref 70–99)

## 2018-02-15 LAB — CBC WITH DIFFERENTIAL/PLATELET
BLASTS: 0 %
Band Neutrophils: 0 %
Basophils Absolute: 0 10*3/uL (ref 0.0–0.1)
Basophils Relative: 0 %
Eosinophils Absolute: 0.2 10*3/uL (ref 0.0–0.7)
Eosinophils Relative: 3 %
HEMATOCRIT: 26.3 % — AB (ref 36.0–46.0)
Hemoglobin: 8.1 g/dL — ABNORMAL LOW (ref 12.0–15.0)
LYMPHS ABS: 1.4 10*3/uL (ref 0.7–4.0)
LYMPHS PCT: 18 %
MCH: 25.4 pg — AB (ref 26.0–34.0)
MCHC: 30.8 g/dL (ref 30.0–36.0)
MCV: 82.4 fL (ref 78.0–100.0)
MONOS PCT: 9 %
Metamyelocytes Relative: 0 %
Monocytes Absolute: 0.7 10*3/uL (ref 0.1–1.0)
Myelocytes: 0 %
NEUTROS ABS: 5.7 10*3/uL (ref 1.7–7.7)
NEUTROS PCT: 70 %
NRBC: 0 /100{WBCs}
PLATELETS: 264 10*3/uL (ref 150–400)
PROMYELOCYTES RELATIVE: 0 %
RBC: 3.19 MIL/uL — ABNORMAL LOW (ref 3.87–5.11)
RDW: 23.9 % — AB (ref 11.5–15.5)
WBC: 8 10*3/uL (ref 4.0–10.5)

## 2018-02-15 LAB — BASIC METABOLIC PANEL
ANION GAP: 15 (ref 5–15)
BUN: 18 mg/dL (ref 8–23)
CALCIUM: 8.3 mg/dL — AB (ref 8.9–10.3)
CO2: 43 mmol/L — AB (ref 22–32)
Chloride: 86 mmol/L — ABNORMAL LOW (ref 98–111)
Creatinine, Ser: 0.64 mg/dL (ref 0.44–1.00)
GLUCOSE: 99 mg/dL (ref 70–99)
POTASSIUM: 2.7 mmol/L — AB (ref 3.5–5.1)
Sodium: 144 mmol/L (ref 135–145)

## 2018-02-15 LAB — PHOSPHORUS: Phosphorus: 4.3 mg/dL (ref 2.5–4.6)

## 2018-02-15 LAB — MAGNESIUM: Magnesium: 1.3 mg/dL — ABNORMAL LOW (ref 1.7–2.4)

## 2018-02-15 MED ORDER — MAGNESIUM SULFATE 2 GM/50ML IV SOLN
2.0000 g | Freq: Once | INTRAVENOUS | Status: AC
  Administered 2018-02-15: 2 g via INTRAVENOUS
  Filled 2018-02-15: qty 50

## 2018-02-15 MED ORDER — HEPARIN SOD (PORK) LOCK FLUSH 100 UNIT/ML IV SOLN
500.0000 [IU] | INTRAVENOUS | Status: AC | PRN
  Administered 2018-02-15: 500 [IU]

## 2018-02-15 MED ORDER — SODIUM CHLORIDE 0.9 % IV SOLN
INTRAVENOUS | Status: DC | PRN
  Administered 2018-02-15: 250 mL via INTRAVENOUS
  Administered 2018-02-16: 500 mL via INTRAVENOUS
  Administered 2018-02-19 – 2018-02-28 (×4): 250 mL via INTRAVENOUS
  Administered 2018-02-28: 500 mL via INTRAVENOUS

## 2018-02-15 MED ORDER — POTASSIUM CHLORIDE 10 MEQ/50ML IV SOLN
10.0000 meq | INTRAVENOUS | Status: AC
  Administered 2018-02-15 (×6): 10 meq via INTRAVENOUS
  Filled 2018-02-15 (×6): qty 50

## 2018-02-15 NOTE — Progress Notes (Signed)
Gynecologic Oncology Progress Note  72 year old female diagnosed with Stage IV ovarian cancer who received four cycles of neoadjuvant chemotherapy with carboplatin and taxol with fourth cycle completed on 12/20/17 .  She underwent interval debulking surgery with Dr. Skeet Latch on 01/27/18 consisting of an exploratory laparotomy infra-gastric omentectomy bilateral salpingo-oophorectomy radical optimal tumor debulking, resection of anterior abdominal wall.  Her post-operative course was complicated by altered mental status noted on POD1 with improvement after Narcan administration but with return of symptoms, acute renal injury, tachycardia, dyspnea (CT chest performed 01/30/18-neg for PE), anemia.  Her AKI and altered mental status appeared to improve on 01/30/18 in the am but symptoms worsened the evening of 01/30/18 with episodes of emesis, tachycardia, hypertension, and confusion. A CT scan on 01/31/18 revealed findings most consistent with ischemic bowel with portal venous gas and bowel pneumatosis and large amount of pneumoperitoneum with concerns for peritonitis.  She was taken back to the OR on 01/31/18 with operative findings of free air and large volume ascites/fluid, but unable to visualize site of bowel perforation, no evidence of ischemia. It was felt that aspiration most likely occurred during intubation.        Subjective: Patient intubated but alert, eyes open, turning head.  Husband at the bedside. No concerns voiced except for the temperature in the room being too cold.  Objective: Vital signs in last 24 hours: Temp:  [99.7 F (37.6 C)-101.1 F (38.4 C)] 100.2 F (37.9 C) (09/10 0600) Pulse Rate:  [94-116] 113 (09/10 0600) Resp:  [18-46] 25 (09/10 0600) BP: (110-167)/(48-98) 164/79 (09/10 0600) SpO2:  [97 %-100 %] 99 % (09/10 0835) FiO2 (%):  [30 %] 30 % (09/10 0835) Weight:  [127 lb 13.9 oz (58 kg)] 127 lb 13.9 oz (58 kg) (09/10 0421) Last BM Date: 02/14/18  Intake/Output from previous  day: 09/09 0701 - 09/10 0700 In: 1433.3 [I.V.:500.6; IV Piggyback:932.7] Out: 2240 [Urine:1665; Emesis/NG output:100; Stool:475]  Physical Examination: General: appears comfortable, alert, turning head to direction of stimuli. Cardio: Regular rhythm, mildly tachycardic intermittently Resp: Coarse breath sounds but clear. On vent GI: Abdomen minimally distended, active bowel sounds.  Flexiseal in place with liquid brown stool.  Abdominal dressing dry and intact.  Dressing changed this am by Dr. Gerarda Fraction. LLQ JP with fluid serous this am with minimal amount of sediment present.  There is no leaking around the tubing entry site. Prior JP sites are healing with no drainage or erythema. Upper aspect of the incision with steri strips in place. Extremities: generalized edema improved.    Labs: WBC/Hgb/Hct/Plts:  8.0/8.1/26.3/264 (09/10 6269) BUN/Cr/glu/ALT/AST/amyl/lip:  18/0.64/--/--/--/--/-- (09/10 0037)  Assessment: 72 y.o. s/p Procedure(s): Interval debulking surgery with Dr. Skeet Latch on 01/27/18 consisting of an exploratory laparotomy infra-gastric omentectomy bilateral salpingo-oophorectomy radical optimal tumor debulking, resection of anterior abdominal wall.  Reoperation of EXPLORATORY LAPAROTOMY, ABDOMINAL WASHOUT on 01/31/18:   Heme:  Post-operative anemia: Hgb 8.1 and Hct 26.3 this am. Stable currently. Last transfusion 02/02/18.   Anemia related to acute critical illness, no evidence of active bleeding.   ID: Fever continues with max temp of 101.7 on 02/14/18. Presumed sepsis initially from gastrointestinal source but unable to ID the perforation after exp lap on 01/31/18. Contribution now from pneumonia and possibly undrained left pelvic fluid collection seen on CT scan 02/07/18; however the pelvic collection is improving. 02/13/18 enteric versus old hematoma appearing contents in JP. Cultures pending. CT from Friday did not show a leak.   Repeat CT 02/14/18: Similar to slight decrease  in size of  ill-defined complex fluid and gas in the lateral left hemipelvis. 2. No evidence of contrast communication between the thickened sigmoid and this pelvic sidewall process. 3. Foley catheter and air within the urinary bladder, which is not decompressed. Correlate with catheter function.   WBC normalized at 8. Granix received 02/01/18 per Dr. Alvy Bimler.  Vancomycin stopped on 02/04/18 and then fevers began again. Restarted Vancomycin on 02/06/18. Anidulafungin initiated 02/01/18.  Meropenum IV started 01/31/18.  Blood cultures from 01/31/18 & 02/06/18 with no growth. Tracheal aspirate from 02/01/18 resulting few gram positive rods. Repeated 02/06/18 showed few yeast.  IR feels no safe window for drainage of pelvic fluid collection.  Repeat imaging showed improvement.  Appreciate ID input (no changes to regimen recommended at this time).  Wound culture from abdominal incision with no growth to date. JP drain fluid pending with gram stain showing few WBC, moderate gram neg rods, few gram positive rods  CV: Tachycardia per CC BP sensitive with changes in sedation. Telemetry, diuresis per CC, lopressor, and labetalol ordered PRN.  Resp: ARDS in setting of aspiration pneumonia and sepsis.   Chest 1 view 9/9 in afternoon: IMPRESSION: Slight improvement in the aeration of the lungs with patchy bilateral airspace opacities. - Weaning per CC.  - Consideration for Tracheostomy per CC  GI:   -CT AP on 01/31/18 with findings consistent with GI perforation.  No obvious perforation identified on re-operation on 01/31/18. GI consultation appreciated.  -TPN initiated 02/02/18 and discontinued 02/10/18. Tube feedings were running continuously at 50cc/hr and are currently still on hold.  May resume per Dr. Gerarda Fraction.  -Loose bowel movements in [place] -Repeat CT 9/6 showed no worsening (in fact decrease size) of pelvic collection. No leak.  -02/13/18 -Enteric versus old hematoma contents in LLQ JP. CT with rectal contrast and oral  contrast this am. -CT pelvis 02/14/18: Stable to slight decrease in pelvic fluid collection, no evidence of contrast communication. -JP fluid culture from 02/13/18:  FEW WBC PRESENT, PREDOMINANTLY PMN  MODERATE GRAM NEGATIVE RODS FEW GRAM POSITIVE RODS  GU: AKI post-op resolved.  Foley in place with yellow urine.  Diuresis per CC.  FEN:  IVF per CC Replacement if needed per CC. K+ 2.7 this am-replaced per CC   Prophylaxis: SCDs in place. Continue Lovenox prophylaxis. No active bleeding and H&H stable.  Plan: Continue care per Critical Care and Dr. Gerarda Fraction. Continue wet to dry dressing changes twice daily.    Resume tube feeds.  Monitor output in JP drain for changes once resumed.  Melissa D Cross 02/15/2018, 9:37 AM

## 2018-02-15 NOTE — Progress Notes (Signed)
Patient ID: Jo Parker, female   DOB: 1945/11/21, 72 y.o.   MRN: [id number]         [facility] for Infectious Disease  Date of Admission:  01/27/2018           Day 16 meropenem        Day 15 vancomycin        Day 15 anidulafungin ASSESSMENT: She continues to have fever on broad antimicrobial therapy for intra-abdominal abscess peritonitis.  PLAN: 1. Continue current antimicrobial therapy 2. Await results of cultures from her JP drain  Active Problems:   Severe sepsis ([place])   Peritonitis ([place])   HCAP (healthcare-associated pneumonia)   Postoperative wound infection   Primary peritoneal carcinomatosis ([place])   Ovarian cancer ([place])   Status post bilateral salpingo-oophorectomy   Severe protein-calorie malnutrition ([name])   COPD (chronic obstructive pulmonary disease) ([name])   HLD (hyperlipidemia)   Absolute anemia   White coat syndrome with hypertension   AKI (acute kidney injury) ([place])   Acute metabolic encephalopathy   Acute respiratory distress   Low urine output   Somnolence   At risk for fluid volume overload   Free intraperitoneal air   Acute respiratory failure ([name])   Pressure injury of skin   Aspiration pneumonia ([name])   Scheduled Meds: . chlorhexidine gluconate (MEDLINE KIT)  15 mL Mouth Rinse BID  . citalopram  20 mg Per Tube Daily  . enoxaparin (LOVENOX) injection  40 mg Subcutaneous Q24H  . free water  100 mL Per Tube Q8H  . furosemide  40 mg Intravenous Q12H  . insulin aspart  0-20 Units Subcutaneous Q4H  . insulin aspart  4 Units Subcutaneous Q4H  . ipratropium-albuterol  3 mL Nebulization TID  . mouth rinse  15 mL Mouth Rinse 10 times per day  . metoprolol tartrate  5 mg Intravenous Q6H  . pantoprazole (PROTONIX) IV  40 mg Intravenous Q12H  . QUEtiapine  25 mg Per Tube Daily  . QUEtiapine  50 mg Oral QHS  . sodium chloride flush  10-40 mL Intracatheter Q12H   Continuous Infusions: . sodium chloride 10 mL/hr at 02/15/18 0300  . sodium  chloride Stopped (02/15/18 0231)  . acetaminophen Stopped (02/15/18 0118)  . anidulafungin 100 mg (02/15/18 0950)  . dextrose 10 mL/hr at 02/15/18 0300  . feeding supplement (VITAL AF 1.2 CAL) 1,000 mL (02/15/18 1005)  . magnesium sulfate 1 - 4 g bolus IVPB 2 g (02/15/18 0954)  . meropenem (MERREM) IV 1 g (02/15/18 0951)  . vancomycin Stopped (02/14/18 1304)   PRN Meds:.sodium chloride, sodium chloride, acetaminophen (TYLENOL) oral liquid 160 mg/5 mL, fentaNYL (SUBLIMAZE) injection, hydrALAZINE, iopamidol, labetalol, sodium chloride flush  Review of Systems: Review of Systems  Unable to perform ROS: Intubated    Allergies  Allergen Reactions  . Oxycodone     confusion    OBJECTIVE: Vitals:   02/15/18 0421 02/15/18 0500 02/15/18 0600 02/15/18 0835  BP:  (!) 142/66 (!) 164/79   Pulse:  (!) 115 (!) 113   Resp:  (!) 46 (!) 25   Temp:  100.2 F (37.9 C) 100.2 F (37.9 C)   TempSrc:      SpO2:  100% 100% 99%  Weight: 58 kg     Height:       Body mass index is 22.65 kg/m.  Physical Exam  Constitutional:  She remains on the ventilator.  She is alert.  Cardiovascular: Normal rate, regular rhythm and normal heart sounds.  No murmur heard. Pulmonary/Chest: She has no wheezes. She has no rales.    Abdominal: Soft. She exhibits no distension.  No more purulent drainage from her abdominal wound. There is a scant amount of clear yellow drainage in her JP drain.  Skin: No rash noted.    Lab Results Lab Results  Component Value Date   WBC 8.0 02/15/2018   HGB 8.1 (L) 02/15/2018   HCT 26.3 (L) 02/15/2018   MCV 82.4 02/15/2018   PLT 264 02/15/2018    Lab Results  Component Value Date   CREATININE 0.64 02/15/2018   BUN 18 02/15/2018   NA 144 02/15/2018   K 2.7 (LL) 02/15/2018   CL 86 (L) 02/15/2018   CO2 43 (H) 02/15/2018    Lab Results  Component Value Date   ALT 69 (H) 02/12/2018   AST 60 (H) 02/12/2018   ALKPHOS 214 (H) 02/12/2018   BILITOT 0.7 02/12/2018      Microbiology: Recent Results (from the past 240 hour(s))  Body fluid culture     Status: None   Collection Time: 02/05/18 11:54 AM  Result Value Ref Range Status   Specimen Description   Final    PERITONEAL INTRAPERITONEAL DRAIN 3 Performed at [facility] [address] [place], [place] [address]    Special Requests   Final    NONE Performed at [facility], [place] [address] [place], [place] [address]    Gram Stain   Final    RARE WBC PRESENT, PREDOMINANTLY PMN NO ORGANISMS SEEN    Culture   Final    NO GROWTH 3 DAYS Performed at [facility] [address], [place], [place] [address]    Report Status 02/08/2018 FINAL  Final  Culture, blood (routine x 2)     Status: None   Collection Time: 02/06/18  2:42 AM  Result Value Ref Range Status   Specimen Description   Final    BLOOD BLOOD RIGHT HAND Performed at [facility] [address], [place], [place] [address]    Special Requests   Final    BOTTLES DRAWN AEROBIC ONLY Blood Culture results may not be optimal due to an inadequate volume of blood received in culture bottles Performed at [facility] [address], [place], [place] [address]    Culture   Final    NO GROWTH 5 DAYS Performed at [facility], [place] [address], [place], [place] [address]    Report Status 02/11/2018 FINAL  Final  Urine Culture     Status: None   Collection Time: 02/06/18 10:40 AM  Result Value Ref Range Status   Specimen Description   Final    urine Performed at [facility] [address], [place], [place] [address]    Special Requests   Final    NONE Performed at [facility], [place] [address] [place], [place] [address]    Culture   Final    NO GROWTH Performed at [facility], [place] [address], [place], [place] [address]    Report Status 02/07/2018 FINAL  Final  Culture, respiratory  (non-expectorated)     Status: None   Collection Time: 02/06/18 11:44 AM  Result Value Ref Range Status   Specimen Description   Final    TRACHEAL ASPIRATE TRACHEAL ASPIRATE Performed at [facility] [address], [place], [place] [address]    Special Requests  Final    NONE Performed at [facility], [place] [address] [place], [place] [address]    Gram Stain   Final    FEW WBC PRESENT, PREDOMINANTLY PMN RARE YEAST Performed at [facility], [place] [address] [place], [place] [address]    Culture FEW CANDIDA ALBICANS FEW CANDIDA GLABRATA   Final   Report Status 02/09/2018 FINAL  Final  Aerobic Culture (superficial specimen)     Status: None   Collection Time: 02/10/18 10:56 AM  Result Value Ref Range Status   Specimen Description   Final    ABDOMEN Performed at [facility], [place] [address], [place], [place] [address]    Special Requests   Final    NONE Performed at [facility], [place] [address], [place], [place] [address]    Gram Stain   Final    RARE WBC PRESENT, PREDOMINANTLY PMN NO ORGANISMS SEEN    Culture   Final    FEW LACTOBACILLUS SPECIES Standardized susceptibility testing for this organism is not available. Performed at [facility], [place] [address] [place], [place] [address]    Report Status 02/14/2018 FINAL  Final  Aerobic/Anaerobic Culture (surgical/deep wound)     Status: None (Preliminary result)   Collection Time: 02/13/18 11:44 AM  Result Value Ref Range Status   Specimen Description JP DRAINAGE  Final   Special Requests NONE  Final   Gram Stain   Final    FEW WBC PRESENT, PREDOMINANTLY PMN MODERATE GRAM NEGATIVE RODS FEW GRAM POSITIVE RODS    Culture   Final    CULTURE REINCUBATED FOR BETTER GROWTH Performed at [facility], [place] [address] [place], [place] [address]    Report Status PENDING  Incomplete    Michel Bickers, MD [facility] [phone] pager   [phone] cell 02/15/2018, 10:40 AM

## 2018-02-15 NOTE — Progress Notes (Signed)
Nutrition Follow-up  DOCUMENTATION CODES:   Not applicable  INTERVENTION:  - Restart Vital AF 1.2 @ 50 mL/hr with 100 mL free water TID. This regimen provides 1440 kcal (98% re-estimated kcal need), 90 grams of protein, and 1273 mL free water.   NUTRITION DIAGNOSIS:   Increased nutrient needs related to post-op healing as evidenced by estimated needs. -ongoing  GOAL:   Patient will meet greater than or equal to 90% of their needs -met with TF regimen  MONITOR:   Vent status, TF tolerance, Weight trends, Labs, Skin  ASSESSMENT:   Patient with PMH significant for COPD< HLD, HTN, mitral valve prolapse, and Stage IV ovarian cancer s/p neoadjuvant chemotherapy . Presents this admission for robotic assisted bilateral salpingo-oophorectomy omentectomy interval debulking.   Significant Events: 8/22:ex lap with infra-gastric omentectomy bilateral salpingo-oophorectomy radical optimal tumor debulking, resection of anterior abdominal wall 8/24:remained somnolent and was confused, noted to have post-op UTI 8/26:remained confused and having chest pain; transferred to SDU for Cardene drip; x-ray showed free air and CT abd concerning for ischemic bowel, bowel pneumatosis, and large amount of pneumoperitoneum; repeat ex lap with washout. 8/27:triple lumen PICC placed. 8/28:TPN initiated; ILE hold x7 days; received Granix per Oncology. 9/4: trickle TF started via NGT. 9/5: TPN weaned off. 9/6: TF rate advanced to 50 mL/hr (goal rate). 9/9: TF placed on hold d/t concern for microperf 9/10: TF restarted at 50 mL/hr.    Weight now trending back toward admission weight. Patient remains intubated with NGT in place. She is more alert this AM with eyes open and tracking RD in the room.   Dr. Alvy Bimler had extensive discussion with patient's two sons and husband this AM concerning [place]. Plan to continue with Full [facility] and family is open to tracheostomy.    Patient is currently intubated on  ventilator support MV: 8.1 L/min Temp (24hrs), Avg:100.3 F (37.9 C), Min:99.7 F (37.6 C), Max:101.1 F (38.4 C) BP: 162/70 and MAP: 105  Medications reviewed; 40 mg IV Lasix BID, sliding scale Novolog, 4 units Novolog every 4 hours, 2 g IV Mg sulfate x1 run today, 40 mg IV Protonix BID, 10 mEq IV KCl x6 runs today. Labs reviewed; CBGs: 85 and 98 mg/dL today, K: 2.7 mmol/L, Cl: 86 mmol/L, Ca: 8.3 mg/dL, Mg: 1.3 mg/dL. IVF; D5 @ 10 mL/hr (41 kcal).      Diet Order:   Diet Order            Diet NPO time specified  Diet effective now              EDUCATION NEEDS:   Education needs have been addressed  Skin:  Skin Assessment: Skin Integrity Issues: Skin Integrity Issues:: Unstageable Unstageable: full thickness to R ear Incisions: abdominal (8/26)  Last BM:  9/9  Height:   Ht Readings from Last 1 Encounters:  01/31/18 _0  (1.6 m)    Weight:   Wt Readings from Last 1 Encounters:  02/15/18 58 kg    Ideal Body Weight:  52.3 kg  BMI:  Body mass index is 22.65 kg/m.  Estimated Nutritional Needs:   Kcal:  1473  Protein:  87-99 grams (1.5-1.7 grams/kg)  Fluid:  >/= 1.5 L/day     Jarome Matin, MS, RD, LDN, [facility] Inpatient Clinical Dietitian Pager # [phone] After hours/weekend pager # [phone]

## 2018-02-15 NOTE — Progress Notes (Signed)
CRITICAL VALUE ALERT  Critical Value:  Potassium 2.7  Date & Time Notied:  02/14/18 @0135   Provider Notified: Yes   Orders Received/Actions taken: replace potassium with 6 runs of K

## 2018-02-15 NOTE — Progress Notes (Signed)
Jo Parker   DOB:1946/01/12   IR#:[id number]    Assessment & Plan:  Ovarian cancer, status post interval debulking surgery The patient developed postoperative complication and remain in ICU There was discussion over the weekend that she may never receive chemotherapy again. At this point in time, the discussion of chemotherapy is irrelevant and not helpful. Recommend to continue active supportive care  Respiratory failure status post intubation The patient has been on ventilator for long time. Family members are wondering about the risk and benefits of tracheostomy. Since this is not my specialty, I recommend consultation with specialist to review the risk and benefits of tracheostomy and the risk and benefits of long-term weaning effort in a long-term acute care facility afterwards  Recurrent infection Appreciate consultation with infectious disease physician. 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 This is also based on my previous encounter with the patient several months ago when she was healthy Based on our understanding, the patient would like to remain in full code and to continue to receive active intervention for now.  Based on that discussion, family members would like her to proceed with tracheostomy if indicated/appropriate and they understood the risk and benefits of the procedure  Discharge planning Will defer to primary service.  I will continue to follow as needed.  [name], MD 02/15/2018  7:43 AM   Subjective: I was requested by primary service to have further discussion with the patient and family members about goals of care given her lengthy stay in the ICU for postoperative complication.  Since her last time I saw  her, she is being weaned off sedation. The patient appears to be intermittently alert.  Unable to assess fully due to her being on the ventilator.  Family members felt that she is improving overall.  Objective:  Vitals:   02/15/18 0500 02/15/18 0600  BP: (!) 142/66 (!) 164/79  Pulse: (!) 115 (!) 113  Resp: (!) 46 (!) 25  Temp: 100.2 F (37.9 C) 100.2 F (37.9 C)  SpO2: 100% 100%     Intake/Output Summary (Last 24 hours) at 02/15/2018 0743 Last data filed at 02/15/2018 6301 Gross per 24 hour  Intake 1403.33 ml  Output 2115 ml  Net -711.67 ml    GENERAL:alert, ventilated LUNGS: clear to auscultation anteriorly HEART:tachycardia, murmurs and no lower extremity edema ABDOMEN:abdomen soft, NEURO: alert unable to assess   Labs:  Lab Results  Component Value Date   WBC 10.0 02/14/2018   HGB 8.6 (L) 02/14/2018   HCT 28.1 (L) 02/14/2018   MCV 81.4 02/14/2018   PLT 275 02/14/2018   NEUTROABS 7.4 02/14/2018    Lab Results  Component Value Date   NA 144 02/15/2018   K 2.7 (LL) 02/15/2018   CL 86 (L) 02/15/2018   CO2 43 (H) 02/15/2018    Studies:  Ct Pelvis Wo Contrast  Result Date: 02/14/2018 CLINICAL DATA:  Evaluate sigmoid colon for leak. Peritonitis or perforation suspected. Rectal contrast given. Adenocarcinoma of the ovaries. Status post chemotherapy, omentectomy, and debulking. EXAM: CT PELVIS WITHOUT CONTRAST TECHNIQUE: Multidetector CT imaging of the pelvis was performed following the standard protocol without intravenous contrast. COMPARISON:  Multiple priors, most recent 02/11/2018. FINDINGS: Urinary Tract: Foley catheter within the urinary bladder. Nondependent air within the  bladder, which is not entirely decompressed. Bowel: Again identified is sigmoid colon wall thickening. Contrast is identified within the sigmoid and descending colon. No extraluminal contrast identified. There are some loops of non-opacified small bowel within the pelvis. Vascular/Lymphatic: Dense  aortic and pelvic atherosclerosis. Pelvic sidewalls not well evaluated for adenopathy secondary to technique. Reproductive:  Hysterectomy. Other: A surgical drain is again identified within the left hemipelvis. There is ill-defined fluid and gas within the lateral left hemipelvis. Example at on the order of 6.6 x 3.2 cm on image 29/2. Compare 6.8 x 2.6 cm at the same level on the prior exam (when remeasured). Anasarca.  Open midline pelvic anterior laparotomy. Musculoskeletal: No acute osseous abnormality. Degenerative disc disease and a moderate disc bulge at L4-5. IMPRESSION: 1. Similar to slight decrease in size of ill-defined complex fluid and gas in the lateral left hemipelvis. 2. No evidence of contrast communication between the thickened sigmoid and this pelvic sidewall process. 3. Foley catheter and air within the urinary bladder, which is not decompressed. Correlate with catheter function. Electronically Signed   By: Abigail Miyamoto M.D.   On: 02/14/2018 12:13   Dg Chest Port 1 View  Result Date: 02/14/2018 CLINICAL DATA:  Respiratory failure. EXAM: PORTABLE CHEST 1 VIEW COMPARISON:  02/11/2018 FINDINGS: Endotracheal tube in satisfactory position. Remainder of the support apparatus is stable. Cardiomediastinal silhouette is normal. Mediastinal contours appear intact. Calcific atherosclerotic disease of the aorta. There is no evidence of pneumothorax. Slight improvement in the aeration of the lungs with residual patchy airspace opacities, right greater than left. Osseous structures are without acute abnormality. Soft tissues are grossly normal. IMPRESSION: Slight improvement in the aeration of the lungs with patchy bilateral airspace opacities. Electronically Signed   By: Fidela Salisbury M.D.   On: 02/14/2018 10:32

## 2018-02-15 NOTE — Progress Notes (Signed)
Gynecologic Oncology Progress Note  72 year old female diagnosed with Stage IV ovarian cancer who received four cycles of neoadjuvant chemotherapy with carboplatin and taxol with fourth cycle completed on 12/20/17 .  She underwent interval debulking surgery with Dr. Skeet Latch on 01/27/18 consisting of an exploratory laparotomy infra-gastric omentectomy bilateral salpingo-oophorectomy radical optimal tumor debulking, resection of anterior abdominal wall.  Her post-operative course was complicated by altered mental status noted on POD1 with improvement after Narcan administration but with return of symptoms, acute renal injury, tachycardia, dyspnea (CT chest performed 01/30/18-neg for PE), anemia.  Her AKI and altered mental status appeared to improve on 01/30/18 in the am but symptoms worsened the evening of 01/30/18 with episodes of emesis, tachycardia, hypertension, and confusion. A CT scan on 01/31/18 revealed findings most consistent with ischemic bowel with portal venous gas and bowel pneumatosis and large amount of pneumoperitoneum with concerns for peritonitis.  She was taken back to the OR on 01/31/18 with operative findings of free air and large volume ascites/fluid, but unable to visualize site of bowel perforation, no evidence of ischemia. It was felt that aspiration most likely occurred during intubation.        Subjective: Patient intubated but alert, eyes open, turning head, seems to be responding with head nods to son's questions.  Two sons at bedside.  Objective: Vital signs in last 24 hours: Temp:  [99.7 F (37.6 C)-101.1 F (38.4 C)] 100.2 F (37.9 C) (09/10 0600) Pulse Rate:  [94-116] 113 (09/10 0600) Resp:  [18-46] 25 (09/10 0600) BP: (110-167)/(48-98) 164/79 (09/10 0600) SpO2:  [97 %-100 %] 100 % (09/10 0600) FiO2 (%):  [30 %] 30 % (09/10 0500) Weight:  [127 lb 13.9 oz (58 kg)] 127 lb 13.9 oz (58 kg) (09/10 0421) Last BM Date: 02/14/18  Intake/Output from previous day: 09/09 0701  - 09/10 0700 In: 1403.3 [I.V.:470.6; IV Piggyback:932.7] Out: 2115 [Urine:1540; Emesis/NG output:100; Stool:475]  Physical Examination: General: appears comfortable, alert, turning head to direction of stimuli.  GI: Abdomen nondistended, NABS. Flexiseal in place with liquid brown stool.  Abdominal dressing dry and intact.  Dressing and packing removed without difficulty.   Good granulation tissue. No malodor or drainage concerning for fistula in the wound or on the removed packing.  The fascia is intact on palpation and free of exudate Wound repacked with dampened normal saline gauze.   LLQ JP showing brown tinged serous fluid. ~20cc emptied in my presence. There is no leaking around the tubing entry site. Prior JP sites are healed with no drainage or erythema. Upper aspect of the incision with steri strips in place.  Extremities: generalized edema   Labs:   BUN/Cr/glu/ALT/AST/amyl/lip:  18/0.64/--/--/--/--/-- (09/10 0037)  Assessment: 72 y.o. s/p Procedure(s): Interval debulking surgery with Dr. Skeet Latch on 01/27/18 consisting of an exploratory laparotomy infra-gastric omentectomy bilateral salpingo-oophorectomy radical optimal tumor debulking, resection of anterior abdominal wall.  Reoperation of EXPLORATORY LAPAROTOMY, ABDOMINAL WASHOUT on 01/31/18:   Heme:  Post-operative anemia. Stable currently. Last transfusion 02/02/18.   Anemia related to acute critical illness, no evidence of active bleeding.   ID: Fever continues with max temp of 101.1. Presumed sepsis initially from gastrointestinal source but unable to ID the perforation after exp lap. Contribution now from pneumonia and possibly undrained left pelvic fluid collection seen on CT scan 02/07/18; however the pelvic collection is improving. 02/13/18 enteric versus old hematoma appearing contents in JP. Cultures pending. CT from Friday did not show a leak. Repeat CT with oral contrast did not  show leak. JP bulb fluid shows moderate GNR but  reincubated for "better growth" WBC normalized at 10. Granix received 02/01/18 per Dr. Alvy Bimler.   Vancomycin stopped on 02/04/18 and then fevers began again. Restarted Vancomycin on 02/06/18. Anidulafungin initiated 02/01/18.  Meropenum IV started 01/31/18.  Blood cultures from 01/31/18 & 02/06/18 with no growth.  Tracheal aspirate from 02/01/18 resulting few gram positive rods. Repeated 02/06/18 showed few yeast.  IR feels no safe window for drainage of pelvic fluid collection.  Repeat imaging showed improvement.  Appreciate ID input (no changes to regimen recommended at this time).  Wound culture from abdominal incision with no growth to date.  Neuro improved alertness, responding to some questions.  CV: Tachycardia per CC BP sensitive with changes in sedation. Telemetry, diuresis per CC, lopressor, and labetalol ordered PRN.  Resp: ARDS in setting of aspiration pneumonia and sepsis.   Chest 1 view 9/9 in afternoon: IMPRESSION: Slight improvement in the aeration of the lungs with patchy bilateral airspace opacities. - Weaning per CC.  - Consideration for Tracheostomy per CC  GI:   CT AP on 01/31/18 with findings consistent with GI perforation.  No obvious perforation identified on re-operation on 01/31/18.  PPI ordered. GI consultation appreciated.  TPN initiated 02/02/18 and discontinued 02/10/18.  Tube feedings running continuously at 50cc/hr  Loose bowel movements in Flexiseal Repeat CT 9/6 showed no worsening (in fact decrease size) of pelvic collection. No leak 02/13/18 -Enteric versus old hematoma contents in LLQ JP. CT with rectal contrast and oral contrast showed no leak. JP fluid culture from 02/13/18:  FEW WBC PRESENT, PREDOMINANTLY PMN  MODERATE GRAM NEGATIVE RODS FEW GRAM POSITIVE RODS; reincubated for better growth Possibility is no active leak and draining of collection versus active leak through a microperf with drainage through the JP.   GU: AKI post-op resolved.  Foley in place with  yellow urine.  Diuresis per CC.  FEN:  IVF per CC Replacement if needed per CC.   Prophylaxis: SCDs in place. Continue Lovenox prophylaxis. No active bleeding and H&H stable.   Plan: Continue care per Critical Care and our team. Continue wet to dry dressing changes twice daily.  Hold on consideration of negative pressure wound therapy at this time.  Dr. Alvy Bimler will visit with patient this am to assist discussion about long-term care goals.    Isabel Caprice 02/15/2018, 7:09 AM

## 2018-02-15 NOTE — Progress Notes (Signed)
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  She is completed 3 cycles of chemotherapy and is now status post bilateral salpingo-oophorectomy and omentectomy with debulking on 8/22.   8/22 bilateral salpingo-nephrectomy omentectomy and debulking surgery  Internal medicine 8/25 Critical care 8/26 General surgery 8/26 8/26 transferred to ICU w/ concern for perforation w/ free air noted.  Imipenem started. Went for exploratory laparotomy with washout.  No significant contamination identified, presumed spontaneously closed microperf. Returned with abdominal drains postoperative chest x-ray showing evidence of aspiration pneumonia; mixed respiratory and metabolic acidosis & in septic shock.    CT abdomen 8/26: Hemic bowel with portal venous gas and bowel pneumatosis with large amount of pneumoperitoneum.  Unclear as to site of perforation.  Multiple pockets of air adjacent to the sigmoid colon.  Enhancement of the peritoneum concerning for peritonitis.  There was aortic arterial sclerosis.  8/26 Intubation  8/26 Exploratory laparotomy, washout, placement of right upper quadrant x2 JP drain and left lower quadrant JP drain  GI medicine 8/27 > did not feel she had overt GIB.  Rec's prophylaxis with PPI.  Signed off. Interventional radiology cons: 9/3 felt left fluid collection was anatomically too risky to drain   Echocardiogram 8/27: Particular ejection fraction 65 to 70%.  She has moderate LVH, and hyperdynamic LV function.  There is grade 1 diastolic dysfunction.  Trivial MR.  Normal left atrial size.  The IV C was not visualized   8/27: weaning pressors. Hgb dropped from 8.8 to 7.4; got 2 units of blood,  febrile, added. vancomycin.  Neupogen  given for neutropenia.  8/28: Pressors off/shock resolved. NAG acidosis and Lactic acidosis resolved.  FiO2/PEEP down, attempted PSV.  WBC count up.  Started TPN.  8/29: FiO2 and PEEP requirements increased, had wean these down to 40/5 now 60/10.  Chest x-ray with worsening right-sided airspace disease likely reflecting de-recruitment. Delirium remains significant obstacle.  Beta-blocker, and clonidine patch added for hypertension  8/30: Diuresed 4.5 L the day prior with Lasix.  Little more awake.  X-ray improved.  Vancomycin discontinued.  Continuing diuresis, RASS goal changed to -1, dropping PEEP to 8. 9/01:  Vanco added back with fevers, clonidine patch 9/02-9/6: Delirium persists. Sedation difficult, diuresing, remains on abx. spiking temps, loculated fluid collection identified in left pelvis. Not amedable to drainage.  ID consulted. Started seroquel. 9/4. Upper 2 drains removed. Staples removed.from incision site.   CT abdomen 9/2 >> drainage catheters within the pelvis, RUQ, LUQ.  Significant interval decrease in size of previously described intra-abdominal fluid, no residual free intraperitoneal air. There is a persistent loculated fluid & gas collection along the left pelvic sidewall measuring up to 10 cm.  Moderate bilateral pleural effusions, underlying opacities favored to represent atelectasis.  Marked wall thickening of the sigmoid colon.  Peripheral low attenuation within the spleen concerning for splenic infarcts.  Wall thickening of the urinary bladder.     9/4: Infectious disease.  Consulted by family.  Had no additional recommendations   CT abdomen 9/6: Improving left lateral pelvic fluid collection, resolved peritoneal fluid in the left lower abdomen.  Concentric thickening of the sigmoid colon.  Decreased bilateral effusion and atelectasis.   9/6: WBC curve normalized but still having low gd fever and purulent drainage from  wound.  CT shows abd fluid collection  better  9/7-9/8: Failing pressure support weans.   9/9 - febrile 101F. WBC ok. Not on pressors. Not on sedation gtt. Tracks per [employer]. Husband at bedside - says he is newly (2y) married to patient and so defers decision to patient's biologic son. Leak noticed in abd drain and with fevers -> perf suspected and going for CT abd -> 1. Similar to slight decrease in size of ill-defined complex fluid and gas in the lateral left hemipelvis. 2. No evidence of contrast communication between the thickened sigmoid and this pelvic sidewall process. 3. Foley catheter and air within the urinary bladder, which is not decompressed. Correlate with catheter function. Electronically Signed   By: Abigail Miyamoto M.D.   On: 02/14/2018 12:13     Subjective/interval events   02/15/2018 - Sister at bedside - says she is not in favor of trach but is decision of son/dpoa. Son met with Dr Alvy Bimler for goals of care - current status precludes chemo but goal is trach and full code and full medical care. Low grade fever persists - ID following. More awake today. No Perf leak on CT abd  Objective    Blood pressure (!) 111/44, pulse (!) 107, temperature (!) 100.4 F (38 C), resp. rate (!) 26, height '5\' 3"'$  (1.6 m), weight 58 kg, SpO2 100 %.    Vent Mode: PRVC FiO2 (%):  [30 %] 30 % Set Rate:  [20 bmp] 20 bmp Vt Set:  [330 mL] 330 mL PEEP:  [5 cmH20] 5 cmH20 Plateau Pressure:  [16 cmH20-22 cmH20] 16 cmH20   Intake/Output Summary (Last 24 hours) at 02/15/2018 1531 Last data filed at 02/15/2018 1450 Gross per 24 hour  Intake 1585.24 ml  Output 1390 ml  Net 195.24 ml   Filed Weights   02/11/18 0600 02/14/18 0442 02/15/18 0421  Weight: 60.2 kg 58.5 kg 58 kg     EXAM  General Appearance:  Looks better. Frail. On vent. More alert Head:  Normocephalic, without obvious abnormality, atraumatic Eyes:  PERRL - uyes, conjunctiva/corneas - clear     Ears:  Normal external ear canals, both ears Nose:  G tube - no Throat:  ETT  TUBE - yes , OG tube - yes - back on TF Neck:  Supple,  No enlargement/tenderness/nodules Lungs: Clear to auscultation bilaterally, Ventilator   Synchrony - yes Heart:  S1 and S2 normal, no murmur, CVP - no.  Pressors - no Abdomen:  Soft, no masses, no organomegaly, Dressing + Genitalia / Rectal:  Not done Extremities:  Extremities- intact Skin:  ntact in exposed areas . Neurologic:  Sedation - none -> RASS - +1 . Moves all 4s - yes. CAM-ICU - more oriented and following commands         Labs    PULMONARY No results for input(s): PHART, PCO2ART, PO2ART, HCO3, TCO2, O2SAT in the last 168 hours.  Invalid input(s): PCO2, PO2  CBC Recent Labs  Lab 02/13/18 0550 02/14/18 0437 02/15/18 0554  HGB 9.2* 8.6* 8.1*  HCT 29.9* 28.1* 26.3*  WBC 10.5 10.0 8.0  PLT 277 275 264    COAGULATION No results for input(s): INR in the last 168 hours.  CARDIAC   No results for input(s): TROPONINI in the last 168 hours. No results for input(s): PROBNP in the last 168 hours.   CHEMISTRY Recent Labs  Lab 02/09/18 0400 02/10/18 0302 02/12/18 0629 02/14/18 0437 02/15/18 0037 02/15/18 0554  NA 140 141 144  --  144  --   K 4.4 4.0 3.3*  --  2.7*  --   CL 90* 91* 91*  --  86*  --   CO2 37* 39* 41*  --  43*  --   GLUCOSE 182* 222* 142*  --  99  --   BUN 32* 38* 30*  --  18  --   CREATININE 0.60 0.68 0.64 0.53 0.64  --   CALCIUM 7.9* 8.0* 8.4*  --  8.3*  --   MG  --  1.8  --   --   --  1.3*  PHOS  --  5.2*  --   --   --  4.3   Estimated Creatinine Clearance: 52.6 mL/min (by C-G formula based on SCr of 0.64 mg/dL).   LIVER Recent Labs  Lab 02/10/18 0302 02/12/18 0629  AST 98* 60*  ALT 80* 69*  ALKPHOS 218* 214*  BILITOT 0.4 0.7  PROT 6.0* 6.8  ALBUMIN 1.5* 1.6*     INFECTIOUS No results for input(s): LATICACIDVEN, PROCALCITON in the last 168 hours.   ENDOCRINE CBG (last 3)  Recent Labs    02/15/18 0834 02/15/18 1152 02/15/18 1529  GLUCAP 96 103* 154*          IMAGING x48h  - image(s) personally visualized  -   highlighted in bold Ct Pelvis Wo Contrast  Result Date: 02/14/2018 CLINICAL DATA:  Evaluate sigmoid colon for leak. Peritonitis or perforation suspected. Rectal contrast given. Adenocarcinoma of the ovaries. Status post chemotherapy, omentectomy, and debulking. EXAM: CT PELVIS WITHOUT CONTRAST TECHNIQUE: Multidetector CT imaging of the pelvis was performed following the standard protocol without intravenous contrast. COMPARISON:  Multiple priors, most recent 02/11/2018. FINDINGS: Urinary Tract: Foley catheter within the urinary bladder. Nondependent air within the bladder, which is not entirely decompressed. Bowel: Again identified is sigmoid colon wall thickening. Contrast is identified within the sigmoid and descending colon. No extraluminal contrast identified. There are some loops of non-opacified small bowel within the pelvis. Vascular/Lymphatic: Dense aortic and pelvic atherosclerosis. Pelvic sidewalls not well evaluated for adenopathy secondary to technique. Reproductive:  Hysterectomy. Other: A surgical drain is again identified within the left hemipelvis. There is ill-defined fluid and gas within the lateral left hemipelvis. Example at on the order of 6.6 x 3.2 cm on image 29/2. Compare 6.8 x 2.6 cm at the same level on the prior exam (when remeasured). Anasarca.  Open midline pelvic anterior laparotomy. Musculoskeletal: No acute osseous abnormality. Degenerative disc disease and a moderate disc bulge at L4-5. IMPRESSION: 1. Similar to slight decrease in size of ill-defined complex fluid and gas in the lateral left hemipelvis. 2. No evidence of contrast communication between the thickened sigmoid and this pelvic sidewall process. 3. Foley catheter and air within the urinary bladder, which is not decompressed. Correlate with catheter function. Electronically Signed   By: Abigail Miyamoto M.D.   On: 02/14/2018 12:13   Dg Chest Port 1  View  Result Date: 02/14/2018 CLINICAL DATA:  Respiratory failure. EXAM: PORTABLE CHEST 1 VIEW COMPARISON:  02/11/2018 FINDINGS: Endotracheal tube in satisfactory position. Remainder of the support apparatus is stable. Cardiomediastinal silhouette is normal. Mediastinal contours appear intact. Calcific atherosclerotic disease of the aorta. There is no evidence of pneumothorax. Slight improvement in the aeration of the lungs with residual patchy airspace opacities, right greater than left. Osseous structures are without acute abnormality. Soft tissues are grossly normal. IMPRESSION: Slight improvement in the aeration of the lungs with patchy bilateral airspace opacities. Electronically  Signed   By: Fidela Salisbury M.D.   On: 02/14/2018 10:32     Micro Data: Blood Culture x2 8/26 >>neg Sputum 8/27: Abundant yeast, few Candida albicans Blood Cx 9/1 >>  Respiratory 9/1 >> few Candida Urine 9/1 >> negative Peritoneal fluid 8/31 >> neg  Antimicrobials:  Meropenem 8/26 >> Vancomycin 8/27 >> 8/30; 9/1 >>  Eraxis 8/27 >>      Assessment & Plan:   Status post exploratory laparotomy for free air on 8/26 ; following bilateral salpingo-oophorectomy, omentectomy, and debulking surgery on 8/22. Reduced fluid collection and no fistula on CT 02/14/18  Plan  - TF - per CCS and GYn ONC   Sepsis, fever with neutropenia: peritonitis (microperforation), complicated by aspiration pneumonia.  -ongoing fevers, 9/2 CT Abd concerning for loculated fluid collection->IR felt too risky for drainage anatomically   02/15/09 - low grade fever. No evidence of perf  Plan Per ID  Acute hypoxic respiratory failure in the setting of aspiration pneumonia/ARDS Right pleural effusion- No sig pocket to drain on bedside US 9/5 Delirium, deconditioning, malnutrition continues to be her major barrier to weaning   - 02/15/2018 -. . Unable to liberate from vent due to delirium, deconditioning and extreme wasting . Goals  of care - trach +  Plan Full vent support SBT as tolerated Trach for Thursday 02/17/18 - d/w operator Dr Nelda Marseille who can do - time not specified yet   Sinus tachycardia, hypertension, and grade 1 diastolic dysfunction with minimal troponin I bump.   Bradycardia - periods of brady to the 30's Labile BP (w/episodes of hypotension seemingly exacerbated by sedation) -Echo showed hyperdynamic ejection fraction with no wall motion abnormality  Plan Lopressor with hold parameters PRN labetalol Continue diuresis Continue telemetry monitoring  Acute metabolic encephalopathy.    - 9/10 /19 - ongoing : due to  multifactorial and secondary to sepsis as well as residual narcotics and metabolic acidosis. Vastly improved   Plan RASS goal 0 seroquel paxil Off Precedex.  Severe low mag and low k Plan replete  Severe Protein Calorie Malnutrition, prealbumin 3.5 Plan Continue tube feeds  Postoperative anemia.  Recent Labs  Lab 02/11/18 0430 02/12/18 0629 02/13/18 0550 02/14/18 0437 02/15/18 0554  HGB 9.3* 9.8* 9.2* 8.6* 8.1*    Plan CBC, continue PPI - PRBC for hgb </= 6.9gm%    - exceptions are   -  if ACS susepcted/confirmed then transfuse for hgb </= 8.0gm%,  or    -  active bleeding with hemodynamic instability, then transfuse regardless of hemoglobin value   At at all times try to transfuse 1 unit prbc as possible with exception of active hemorrhage     Leukocytosis.  Initially was neutropenic however did receive Granix on 8/28 per oncology; has normalized once again as of 9/6 Plan Continue to trend CBC  Thrombocytopenia; stable; improved Plan Trend CBC  Hyperglycemia Plan Continue current sliding scale and NovoLog coverage  Stage IV ovarian cancer, now status post bilateral salpingo-oophorectomy omentectomy and debulking surgery on 8/22 Plan Per GYN oncology  - including goals of care and directions on chemo Chemo when recovered from acute/chronic critical  illness    Best practice/Goals of care  DVT prophylaxis: LMWH GI prophylaxis: Changed to PPI on 8/27 Diet: TF (s/p TPN on 8/28 - stopped ? Date.) Mobility: BR Code Status: Full Code Family Communication: sister  updated 02/15/2018   Disposition / Summary of Today's Plan 02/15/2018    Trach arranged. Needs placement. If she improves from  ECOG 4 to 2 after critical illness she can get chemo. When this will be is uncertain but currently goal is to trach/ltach   [place]  The patient is critically ill with multiple organ systems failure and requires high complexity decision making for assessment and support, frequent evaluation and titration of therapies, application of advanced monitoring technologies and extensive interpretation of multiple databases.   Critical Care Time devoted to patient care services described in this note is  30  Minutes. This time reflects time of care of this signee Dr Brand Males. This critical care time does not reflect procedure time, or teaching time or supervisory time of PA/NP/Med student/Med Resident etc but could involve care discussion time     Dr. Brand Males, M.D., [facility].C.P Pulmonary and Critical Care Medicine Staff Physician [facility] Pager: [phone], If no answer or between  15:00h - 7:00h: call [phone]  02/15/2018 3:43 PM

## 2018-02-15 NOTE — Care Management (Signed)
   MCG Used: resp. Failure Optimal GLOS: tbd Hospital Day: 19  Common treatments and tests include(2)(3)(4)(5)(6)(7)(8)(13)(14): ? Mechanical ventilation(11)(12) ? Bronchodilators, steroids, and chest physiotherapy(25) ? Antibiotics ? Tracheal or bronchial suctioning ? ABG, chest x-ray, oximetry, and chest CT scan ? Laboratory tests, including chemistries, toxicology screen, drug levels, and serologic testing as indicated(26) ? Repeated measures of weaning parameters and respiratory muscle strength(26)(27)(28)(29) ? Weaning trials in ICU or intermediate care unit(27)(28)(29)(30) ? Noninvasive ventilation (eg, to avoid intubation or to facilitate weaning from mechanical ventilation)(4)(5)(10)(31)(32) ? Weaning barriers assessment[E]   Identified Barriers: Date/Name case discussed with attending MD 02/1018 Date/name case discussed with supervisor (if applicable):  Date/name case referred to physician advisor 02/15/2018   Concurrent Review - Discharge Readiness Milestones, Not Met

## 2018-02-15 NOTE — Progress Notes (Signed)
[facility] Progress Note Patient Name: Jo Parker DOB: 1945/11/19 MRN: [id number]   Date of Service  02/15/2018  HPI/Events of Note  K+ = 2.7 and Creatinine = 0.64.   eICU Interventions  Will replace K+.     Intervention Category Major Interventions: Electrolyte abnormality - evaluation and management  Nygel Prokop Eugene 02/15/2018, 2:04 AM

## 2018-02-16 LAB — CBC WITH DIFFERENTIAL/PLATELET
BASOS PCT: 0 %
Basophils Absolute: 0 10*3/uL (ref 0.0–0.1)
EOS ABS: 0.3 10*3/uL (ref 0.0–0.7)
EOS PCT: 5 %
HCT: 25.6 % — ABNORMAL LOW (ref 36.0–46.0)
Hemoglobin: 8 g/dL — ABNORMAL LOW (ref 12.0–15.0)
LYMPHS ABS: 1.1 10*3/uL (ref 0.7–4.0)
Lymphocytes Relative: 16 %
MCH: 25.5 pg — AB (ref 26.0–34.0)
MCHC: 31.3 g/dL (ref 30.0–36.0)
MCV: 81.5 fL (ref 78.0–100.0)
Monocytes Absolute: 0.5 10*3/uL (ref 0.1–1.0)
Monocytes Relative: 8 %
NEUTROS PCT: 71 %
Neutro Abs: 4.8 10*3/uL (ref 1.7–7.7)
PLATELETS: 275 10*3/uL (ref 150–400)
RBC: 3.14 MIL/uL — ABNORMAL LOW (ref 3.87–5.11)
RDW: 24.1 % — AB (ref 11.5–15.5)
WBC: 6.7 10*3/uL (ref 4.0–10.5)

## 2018-02-16 LAB — GLUCOSE, CAPILLARY
GLUCOSE-CAPILLARY: 133 mg/dL — AB (ref 70–99)
GLUCOSE-CAPILLARY: 101 mg/dL — AB (ref 70–99)
GLUCOSE-CAPILLARY: 94 mg/dL (ref 70–99)
Glucose-Capillary: 125 mg/dL — ABNORMAL HIGH (ref 70–99)
Glucose-Capillary: 134 mg/dL — ABNORMAL HIGH (ref 70–99)
Glucose-Capillary: 152 mg/dL — ABNORMAL HIGH (ref 70–99)

## 2018-02-16 LAB — VANCOMYCIN, PEAK: VANCOMYCIN PK: 49 ug/mL — AB (ref 30–40)

## 2018-02-16 LAB — MAGNESIUM: Magnesium: 1.9 mg/dL (ref 1.7–2.4)

## 2018-02-16 LAB — VANCOMYCIN, TROUGH: Vancomycin Tr: 12 ug/mL — ABNORMAL LOW (ref 15–20)

## 2018-02-16 LAB — PHOSPHORUS: Phosphorus: 2.2 mg/dL — ABNORMAL LOW (ref 2.5–4.6)

## 2018-02-16 MED ORDER — POTASSIUM PHOSPHATES 15 MMOLE/5ML IV SOLN
10.0000 mmol | Freq: Once | INTRAVENOUS | Status: AC
  Administered 2018-02-16: 10 mmol via INTRAVENOUS
  Filled 2018-02-16: qty 3.33

## 2018-02-16 MED ORDER — MAGNESIUM SULFATE 2 GM/50ML IV SOLN
2.0000 g | Freq: Once | INTRAVENOUS | Status: AC
  Administered 2018-02-16: 2 g via INTRAVENOUS
  Filled 2018-02-16: qty 50

## 2018-02-16 MED ORDER — VECURONIUM BROMIDE 10 MG IV SOLR: 10.0000 mg | Freq: Once | INTRAVENOUS | Status: DC

## 2018-02-16 MED ORDER — VECURONIUM BROMIDE 10 MG IV SOLR
10.0000 mg | Freq: Once | INTRAVENOUS | Status: AC
  Administered 2018-02-17: 10 mg via INTRAVENOUS
  Filled 2018-02-16: qty 10

## 2018-02-16 MED ORDER — ETOMIDATE 2 MG/ML IV SOLN: 40.0000 mg | Freq: Once | INTRAVENOUS | Status: DC

## 2018-02-16 MED ORDER — PROPOFOL 500 MG/50ML IV EMUL: 5.0000 ug/kg/min | Freq: Once | INTRAVENOUS | Status: DC

## 2018-02-16 MED ORDER — MIDAZOLAM HCL 2 MG/2ML IJ SOLN: 4.0000 mg | Freq: Once | INTRAMUSCULAR | Status: DC

## 2018-02-16 MED ORDER — MIDAZOLAM HCL 2 MG/2ML IJ SOLN
4.0000 mg | Freq: Once | INTRAMUSCULAR | Status: AC
  Administered 2018-02-17: 2 mg via INTRAVENOUS
  Filled 2018-02-16: qty 4

## 2018-02-16 MED ORDER — PROPOFOL 1000 MG/100ML IV EMUL
5.0000 ug/kg/min | INTRAVENOUS | Status: DC
  Filled 2018-02-16: qty 50

## 2018-02-16 MED ORDER — PANTOPRAZOLE SODIUM 40 MG PO PACK
40.0000 mg | PACK | Freq: Two times a day (BID) | ORAL | Status: DC
  Administered 2018-02-16 – 2018-02-23 (×16): 40 mg
  Filled 2018-02-16 (×18): qty 20

## 2018-02-16 MED ORDER — VANCOMYCIN HCL IN DEXTROSE 750-5 MG/150ML-% IV SOLN
750.0000 mg | INTRAVENOUS | Status: DC
  Administered 2018-02-17 – 2018-02-21 (×5): 750 mg via INTRAVENOUS
  Filled 2018-02-16 (×5): qty 150

## 2018-02-16 MED ORDER — FENTANYL CITRATE (PF) 100 MCG/2ML IJ SOLN
200.0000 ug | Freq: Once | INTRAMUSCULAR | Status: AC
  Administered 2018-02-17: 100 ug via INTRAVENOUS
  Filled 2018-02-16: qty 4

## 2018-02-16 MED ORDER — SODIUM CHLORIDE 0.9 % IV SOLN
3.0000 g | Freq: Four times a day (QID) | INTRAVENOUS | Status: DC
  Administered 2018-02-16 – 2018-02-28 (×48): 3 g via INTRAVENOUS
  Filled 2018-02-16 (×50): qty 3

## 2018-02-16 MED ORDER — PROPOFOL 500 MG/50ML IV EMUL
5.0000 ug/kg/min | INTRAVENOUS | Status: DC
  Filled 2018-02-16: qty 50

## 2018-02-16 MED ORDER — FENTANYL CITRATE (PF) 100 MCG/2ML IJ SOLN: 200.0000 ug | Freq: Once | INTRAMUSCULAR | Status: DC

## 2018-02-16 MED ORDER — ETOMIDATE 2 MG/ML IV SOLN
40.0000 mg | Freq: Once | INTRAVENOUS | Status: AC
  Administered 2018-02-17: 20 mg via INTRAVENOUS
  Filled 2018-02-16: qty 20

## 2018-02-16 NOTE — Progress Notes (Signed)
Pharmacy Antibiotic Note  Jo Parker is a 72 y.o. female admitted on 01/27/2018 with hx of ovarian cancer for bilateral salpingo-oophorectomy and omentectomy with debulking on 8/22.  She now has developed suspected intra-abdominal infection.  CT abdomen results worrisome for ischemic bowel with portal venous gas, bowel pneumatosis, and large amount of pneumoperitoneum without clear source of perforation.  Pharmacy has been consulted for meropenem dosing on 8/26 and added vancomycin on 8/27 for pneumonia. Vancomycin d/c'ed 8/30, but resumed 9/1 for new fevers. Anidulafungin added for empiric coverage given concern for intra-abdominal infection, ongoing fever.  Today, 02/16/18:  Tmax 100.9 but with basically continuous fevers  WBC WNL  SCr WNL/stable   Plan per ID:  Continue Meropenem 1g IV q8h (day #17)  Continue Anidulafungin 100mg  IV q24h (day #16)  Continue Vancomycin 1g IV q24h (day #11 of resumption, day #16 total).  Will recheck a vanc peak and trough today with NOON dose  Follow up renal fxn, culture results, clinical course, ID recommendations on duration of therapy.   Height: 5\' 3"  (160 cm) Weight: 125 lb 10.6 oz (57 kg) IBW/kg (Calculated) : 52.4  Temp (24hrs), Avg:100.3 F (37.9 C), Min:99.5 F (37.5 C), Max:100.9 F (38.3 C)  Recent Labs  Lab 02/09/18 1440 02/10/18 0302 02/10/18 1200  02/12/18 0629 02/13/18 0550 02/14/18 0437 02/15/18 0037 02/15/18 0554 02/16/18 0455  WBC  --  9.9  --    < > 9.3 10.5 10.0  --  8.0 6.7  CREATININE  --  0.68  --   --  0.64  --  0.53 0.64  --   --   VANCOTROUGH  --   --  9*  --   --   --   --   --   --   --   VANCOPEAK 36  --   --   --   --   --   --   --   --   --    < > = values in this interval not displayed.    Estimated Creatinine Clearance: 52.6 mL/min (by C-G formula based on SCr of 0.64 mg/dL).    Allergies  Allergen Reactions  . Oxycodone     confusion    Antimicrobials this admission: 8/26  Meropenem>> 8/27 Vancomycin >>8/30, resume 9/1 >>  8/27 Anidulafungin >>   Dose adjustments this admission: 9/4 1440 Vanc peak = 36 mcg/mL, 9/5 1200 Vanc trough = 9 mcg/mL --> AUC 485, no change to Vanc regimen  Microbiology results: 8/24 UCx: NGF 8/25 MRSA PCR: negative 8/26 BCx: NGF 8/27 Trach asp: few candida albicans 8/31 Intraperitoneal drain#3: NGF 9/1 BCx (x1): NGF 9/1 Trach asp: few candida albicans, few candida glabrata  9/1 UCx: NGF 9/5 abdomen: culture reincubated for better growth - few lactobacillus 9/8 JP drain - mod GNR and few GPR - recincubated   Thank you for allowing pharmacy to be a part of this patient's care.  Adrian Saran, PharmD, BCPS Pager [phone] 02/16/2018 8:52 AM

## 2018-02-16 NOTE — Progress Notes (Signed)
Brief Antibiotic Note - Vancomycin  Labs: Vanc Peak at 1400 = 49 Vanc Trough at 1122 = 12 (Note 1g dose given at 1200)  A/P: Levels drawn appropriately and vanc 1g IV q24 given at [facility] as scheduled. Peak and trough numbers calculate to an AUC of 652 (goal 400-500) with 0.0658 and t1/2 of about 10 hours. Will change vanc from 1g IV q24 to 750mg  IV q24 which will give a AUC of 489. JP drain culture growing Ecoli so hopefull vanc will be d/c'd  Adrian Saran, PharmD, BCPS Pager [phone] 02/16/2018 3:17 PM

## 2018-02-16 NOTE — Progress Notes (Signed)
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  She is completed 3 cycles of chemotherapy and is now status post bilateral salpingo-oophorectomy and omentectomy with debulking on 8/22.   8/22 bilateral salpingo-nephrectomy omentectomy and debulking surgery  Internal medicine 8/25 Critical care 8/26 General surgery 8/26 8/26 transferred to ICU w/ concern for perforation w/ free air noted.  Imipenem started. Went for exploratory laparotomy with washout.  No significant contamination identified, presumed spontaneously closed microperf. Returned with abdominal drains postoperative chest x-ray showing evidence of aspiration pneumonia; mixed respiratory and metabolic acidosis & in septic shock.    CT abdomen 8/26: Hemic bowel with portal venous gas and bowel pneumatosis with large amount of pneumoperitoneum.  Unclear as to site of perforation.  Multiple pockets of air adjacent to the sigmoid colon.  Enhancement of the peritoneum concerning for peritonitis.  There was aortic arterial sclerosis.  8/26 Intubation  8/26 Exploratory laparotomy, washout, placement of right upper quadrant x2 JP drain and left lower quadrant JP drain  GI medicine 8/27 > did not feel she had overt GIB.  Rec's prophylaxis with PPI.  Signed off. Interventional radiology cons: 9/3 felt left fluid collection was anatomically too risky to drain   Echocardiogram 8/27: Particular ejection fraction 65 to 70%.  She has moderate LVH, and hyperdynamic LV function.  There is grade 1 diastolic dysfunction.  Trivial MR.  Normal left atrial size.  The IV C was not visualized   8/27: weaning pressors. Hgb dropped from 8.8 to 7.4; got 2 units of blood,  febrile, added. vancomycin.  Neupogen  given for neutropenia.  8/28: Pressors off/shock resolved. NAG acidosis and Lactic acidosis resolved.  FiO2/PEEP down, attempted PSV.  WBC count up.  Started TPN.  8/29: FiO2 and PEEP requirements increased, had wean these down to 40/5 now 60/10.  Chest x-ray with worsening right-sided airspace disease likely reflecting de-recruitment. Delirium remains significant obstacle.  Beta-blocker, and clonidine patch added for hypertension  8/30: Diuresed 4.5 L the day prior with Lasix.  Little more awake.  X-ray improved.  Vancomycin discontinued.  Continuing diuresis, RASS goal changed to -1, dropping PEEP to 8. 9/01:  Vanco added back with fevers, clonidine patch 9/02-9/6: Delirium persists. Sedation difficult, diuresing, remains on abx. spiking temps, loculated fluid collection identified in left pelvis. Not amedable to drainage.  ID consulted. Started seroquel. 9/4. Upper 2 drains removed. Staples removed.from incision site.   CT abdomen 9/2 >> drainage catheters within the pelvis, RUQ, LUQ.  Significant interval decrease in size of previously described intra-abdominal fluid, no residual free intraperitoneal air. There is a persistent loculated fluid & gas collection along the left pelvic sidewall measuring up to 10 cm.  Moderate bilateral pleural effusions, underlying opacities favored to represent atelectasis.  Marked wall thickening of the sigmoid colon.  Peripheral low attenuation within the spleen concerning for splenic infarcts.  Wall thickening of the urinary bladder.     9/4: Infectious disease.  Consulted by family.  Had no additional recommendations   CT abdomen 9/6: Improving left lateral pelvic fluid collection, resolved peritoneal fluid in the left lower abdomen.  Concentric thickening of the sigmoid colon.  Decreased bilateral effusion and atelectasis.   9/6: WBC curve normalized but still having low gd fever and purulent drainage from  wound.  CT shows abd fluid collection  better  9/7-9/8: Failing pressure support weans.   9/9 - febrile 101F. WBC ok. Not on pressors. Not on sedation gtt. Tracks per [employer]. Husband at bedside - says he is newly (2y) married to patient and so defers decision to patient's biologic son. Leak noticed in abd drain and with fevers -> perf suspected and going for CT abd -> 1. Similar to slight decrease in size of ill-defined complex fluid and gas in the lateral left hemipelvis. 2. No evidence of contrast communication between the thickened sigmoid and this pelvic sidewall process. 3. Foley catheter and air within the urinary bladder, which is not decompressed. Correlate with catheter function. Electronically Signed   By: Abigail Miyamoto M.D.   On: 02/14/2018 12:13    9/10 = Sister at bedside - says she is not in favor of trach but says is the decision of son/dpoa. Son met with Dr Alvy Bimler for goals of care - current status precludes chemo but goal is trach and full code and full medical care and later chemo when ECOG improves.  Low grade fever persists - ID following. More awake today. No Perf leak on CT abd and restarted TF    Subjective/interval events   02/16/2018 - low grade fever persists. WBC improving. Diffusely weak per RN but appears oriented. Failed SBT. Dr Nelda Marseille has agreed to do trach 02/17/18 schedule permitting but time not known yet. [place]  Objective    Blood pressure (!) 135/59, pulse (!) 106, temperature (!) 100.8 F (38.2 C), resp. rate (!) 25, height _0  (1.6 m), weight 57 kg, SpO2 100 %.    Vent Mode: PRVC FiO2 (%):  [30 %] 30 % Set Rate:  [20 bmp] 20 bmp Vt Set:  [330 mL] 330 mL PEEP:  [5 cmH20] 5 cmH20 Pressure Support:  [0 cmH20] 0 cmH20 Plateau Pressure:  [14 cmH20-22 cmH20] 14 cmH20   Intake/Output Summary (Last 24 hours) at 02/16/2018 1110 Last data filed at 02/16/2018 0830 Gross per 24 hour  Intake 1684.61 ml  Output 1835 ml  Net -150.39 ml   Filed Weights   02/14/18 0442 02/15/18 0421 02/16/18 0600   Weight: 58.5 kg 58 kg 57 kg     EXAM  General Appearance:  Emacitated and weak Head:  Normocephalic, without obvious abnormality, atraumatic Eyes:  PERRL - yes, conjunctiva/corneas - clear     Ears:  Normal external ear canals, both ears Nose:  G tube - no Throat:  ETT TUBE - yes , OG tube - yes on TF at 50cc/h Neck:  Supple,  No enlargement/tenderness/nodules Lungs: Clear to auscultation bilaterally, Ventilator   Synchrony - yes Heart:  S1 and S2 normal, no murmur, CVP - no.  Pressors - no Abdomen:  Soft, no masses, no organomegaly Genitalia / Rectal:  Not done Extremities:  Extremities- intact Skin:  ntact in exposed areas . Neurologic:  Sedation - none -> RASS - -1 . Moves all 4s - weakly yes. CAM-ICU - follows commands , calm Labs    PULMONARY No results for input(s): PHART, PCO2ART, PO2ART, HCO3, TCO2, O2SAT in the last 168 hours.  Invalid input(s): PCO2, PO2  CBC Recent Labs  Lab 02/14/18 0437 02/15/18 0554 02/16/18 0455  HGB 8.6* 8.1* 8.0*  HCT 28.1* 26.3* 25.6*  WBC 10.0 8.0 6.7  PLT 275 264 275    COAGULATION No results for input(s): INR in the last 168 hours.  CARDIAC   No results for input(s):  TROPONINI in the last 168 hours. No results for input(s): PROBNP in the last 168 hours.   CHEMISTRY Recent Labs  Lab 02/10/18 0302 02/12/18 0629 02/14/18 0437 02/15/18 0037 02/15/18 0554 02/16/18 0455  NA 141 144  --  144  --   --   K 4.0 3.3*  --  2.7*  --   --   CL 91* 91*  --  86*  --   --   CO2 39* 41*  --  43*  --   --   GLUCOSE 222* 142*  --  99  --   --   BUN 38* 30*  --  18  --   --   CREATININE 0.68 0.64 0.53 0.64  --   --   CALCIUM 8.0* 8.4*  --  8.3*  --   --   MG 1.8  --   --   --  1.3* 1.9  PHOS 5.2*  --   --   --  4.3 2.2*   Estimated Creatinine Clearance: 52.6 mL/min (by C-G formula based on SCr of 0.64 mg/dL).   LIVER Recent Labs  Lab 02/10/18 0302 02/12/18 0629  AST 98* 60*  ALT 80* 69*  ALKPHOS 218* 214*  BILITOT 0.4  0.7  PROT 6.0* 6.8  ALBUMIN 1.5* 1.6*     INFECTIOUS No results for input(s): LATICACIDVEN, PROCALCITON in the last 168 hours.   ENDOCRINE CBG (last 3)  Recent Labs    02/15/18 2347 02/16/18 0358 02/16/18 0810  GLUCAP 109* 133* 152*         IMAGING x48h  - image(s) personally visualized  -   highlighted in bold Ct Pelvis Wo Contrast  Result Date: 02/14/2018 CLINICAL DATA:  Evaluate sigmoid colon for leak. Peritonitis or perforation suspected. Rectal contrast given. Adenocarcinoma of the ovaries. Status post chemotherapy, omentectomy, and debulking. EXAM: CT PELVIS WITHOUT CONTRAST TECHNIQUE: Multidetector CT imaging of the pelvis was performed following the standard protocol without intravenous contrast. COMPARISON:  Multiple priors, most recent 02/11/2018. FINDINGS: Urinary Tract: Foley catheter within the urinary bladder. Nondependent air within the bladder, which is not entirely decompressed. Bowel: Again identified is sigmoid colon wall thickening. Contrast is identified within the sigmoid and descending colon. No extraluminal contrast identified. There are some loops of non-opacified small bowel within the pelvis. Vascular/Lymphatic: Dense aortic and pelvic atherosclerosis. Pelvic sidewalls not well evaluated for adenopathy secondary to technique. Reproductive:  Hysterectomy. Other: A surgical drain is again identified within the left hemipelvis. There is ill-defined fluid and gas within the lateral left hemipelvis. Example at on the order of 6.6 x 3.2 cm on image 29/2. Compare 6.8 x 2.6 cm at the same level on the prior exam (when remeasured). Anasarca.  Open midline pelvic anterior laparotomy. Musculoskeletal: No acute osseous abnormality. Degenerative disc disease and a moderate disc bulge at L4-5. IMPRESSION: 1. Similar to slight decrease in size of ill-defined complex fluid and gas in the lateral left hemipelvis. 2. No evidence of contrast communication between the thickened  sigmoid and this pelvic sidewall process. 3. Foley catheter and air within the urinary bladder, which is not decompressed. Correlate with catheter function. Electronically Signed   By: Abigail Miyamoto M.D.   On: 02/14/2018 12:13      Micro Data: Blood Culture x2 8/26 >>neg Sputum 8/27: Abundant yeast, few Candida albicans Blood Cx 9/1 >>  Respiratory 9/1 >> few Candida Urine 9/1 >> negative Peritoneal fluid 8/31 >> neg  Antimicrobials:  Meropenem 8/26 >> Vancomycin  8/27 >> 8/30; 9/1 >>  Eraxis 8/27 >>      Assessment & Plan:   Status post exploratory laparotomy for free air on 8/26 ; following bilateral salpingo-oophorectomy, omentectomy, and debulking surgery on 8/22. Reduced fluid collection and no fistula on CT 02/14/18  Plan  - TF - per CCS and GYn ONC   Sepsis, fever with neutropenia: peritonitis (microperforation), complicated by aspiration pneumonia.  -ongoing fevers, 9/2 CT Abd concerning for loculated fluid collection->IR felt too risky for drainage anatomically   02/15/09 - low grade fever. No evidence of perf  Plan Per ID  Acute hypoxic respiratory failure in the setting of aspiration pneumonia/ARDS Right pleural effusion- No sig pocket to drain on bedside US 9/5 Delirium, deconditioning, malnutrition continues to be her major barrier to weaning   - 02/16/2018 -.  Failed SBT for acute resp failure. . Unable to liberate from vent due to delirium, deconditioning and extreme wasting .   Plan Full vent support SBT as tolerated Trach for Thursday 02/17/18 - d/w operator Dr Nelda Marseille on 02/15/18 who can do - time not specified yet   Sinus tachycardia, hypertension, and grade 1 diastolic dysfunction with minimal troponin I bump.   Bradycardia - periods of brady to the 30's Labile BP (w/episodes of hypotension seemingly exacerbated by sedation) -Echo showed hyperdynamic ejection fraction with no wall motion abnormality  Plan Lopressor with hold parameters PRN  labetalol Continue diuresis Continue telemetry monitoring  Acute metabolic encephalopathy.    - 02/16/18 - improved (due to  multifactorial and secondary to sepsis as well as residual narcotics and metabolic acidosis). Vastly   Plan RASS goal 0 seroquel paxil Off Precedex.  Low mag and low phos 02/16/18 Plan replete  Severe Protein Calorie Malnutrition, prealbumin 3.5 Plan Continue tube feeds  Postoperative anemia.  Recent Labs  Lab 02/12/18 0629 02/13/18 0550 02/14/18 0437 02/15/18 0554 02/16/18 0455  HGB 9.8* 9.2* 8.6* 8.1* 8.0*    Plan CBC, continue PPI - PRBC for hgb </= 6.9gm%    - exceptions are   -  if ACS susepcted/confirmed then transfuse for hgb </= 8.0gm%,  or    -  active bleeding with hemodynamic instability, then transfuse regardless of hemoglobin value   At at all times try to transfuse 1 unit prbc as possible with exception of active hemorrhage     Leukocytosis.  Initially was neutropenic however did receive Granix on 8/28 per oncology; has normalized once again as of 9/6 Plan Continue to trend CBC  Thrombocytopenia; stable; improved Plan Trend CBC  Hyperglycemia Plan Continue current sliding scale and NovoLog coverage  Stage IV ovarian cancer, now status post bilateral salpingo-oophorectomy omentectomy and debulking surgery on 8/22 Plan Per GYN oncology  - including goals of care and directions on chemo Chemo when recovered from acute/chronic critical illness    Best practice/Goals of care  DVT prophylaxis: LMWH GI prophylaxis: Changed to PPI on 8/27 Diet: TF (s/p TPN on 8/28 - stopped ? Date.) Mobility: BR Code Status: Full Code Family Communication: husband updated 02/16/2018   Disposition / Summary of Today's Plan 02/16/2018    Trach arranged. Needs placement. If she improves from ECOG 4 to 2 after critical illness she can get chemo. When this will be is uncertain but currently goal is to trach/ltach   ATTESTATION &  SIGNATURE    Dr. Brand Males, M.D., F.C.C.P,  Pulmonary and Critical Care Medicine Staff Physician, [facility] Director - Interstitial Lung Disease  Program  Pulmonary  [place] [facility] [place], [place], [address]  Pager: [phone], If no answer or between  15:00h - 7:00h: call [phone] Telephone: [phone]  11:24 AM 02/16/2018

## 2018-02-16 NOTE — Progress Notes (Signed)
Patient ID: Rylynn Kobs, female   DOB: [birth year], 72 y.o.   MRN: [id number]         [facility]  Date of Admission:  01/27/2018           Day 17 meropenem        Day 16 vancomycin        Day 16 anidulafungin ASSESSMENT: Her temperature has trended down slightly over the past few days.  Her abdominal drain culture is growing a probable anaerobe which is not surprising.  Continuing broad empiric antibiotic therapy for intra-abdominal infection and pelvic abscesses.  I discussed my recommendations with her husband, sister and son [name] (the latter by phone).  PLAN: 1. Continue current antimicrobial therapy 2. Await results of cultures from her JP drain  Active Problems:   Severe sepsis ([place])   Peritonitis ([place])   HCAP (healthcare-associated pneumonia)   Postoperative wound infection   Primary peritoneal carcinomatosis ([place])   Ovarian cancer ([place])   Status post bilateral salpingo-oophorectomy   Severe protein-calorie malnutrition ([name])   COPD (chronic obstructive pulmonary disease) ([name])   HLD (hyperlipidemia)   Absolute anemia   White coat syndrome with hypertension   AKI (acute kidney injury) ([place])   Acute metabolic encephalopathy   Acute respiratory distress   Low urine output   Somnolence   At risk for fluid volume overload   Free intraperitoneal air   Acute respiratory failure ([name])   Pressure injury of skin   Aspiration pneumonia ([name])   Pelvic abscess in female   Scheduled Meds: . chlorhexidine gluconate (MEDLINE KIT)  15 mL Mouth Rinse BID  . citalopram  20 mg Per Tube Daily  . enoxaparin (LOVENOX) injection  40 mg Subcutaneous Q24H  . [START ON 02/17/2018] etomidate  40 mg Intravenous Once  . [START ON 02/17/2018] fentaNYL (SUBLIMAZE) injection  200 mcg Intravenous Once  . free water  100 mL Per Tube Q8H  . furosemide  40 mg Intravenous Q12H  . insulin aspart  0-20 Units Subcutaneous Q4H  . insulin aspart  4 Units Subcutaneous  Q4H  . ipratropium-albuterol  3 mL Nebulization TID  . mouth rinse  15 mL Mouth Rinse 10 times per day  . metoprolol tartrate  5 mg Intravenous Q6H  . [START ON 02/17/2018] midazolam  4 mg Intravenous Once  . pantoprazole sodium  40 mg Per Tube BID  . QUEtiapine  25 mg Per Tube Daily  . QUEtiapine  50 mg Oral QHS  . sodium chloride flush  10-40 mL Intracatheter Q12H  . [START ON 02/17/2018] vecuronium  10 mg Intravenous Once   Continuous Infusions: . sodium chloride Stopped (02/15/18 0951)  . sodium chloride 10 mL/hr at 02/16/18 0830  . anidulafungin Stopped (02/16/18 1150)  . dextrose 10 mL/hr at 02/16/18 0830  . feeding supplement (VITAL AF 1.2 CAL) 1,000 mL (02/15/18 1005)  . meropenem (MERREM) IV Stopped (02/16/18 1033)  . potassium PHOSPHATE IVPB (in mmol) 10 mmol (02/16/18 1323)  . [START ON 02/17/2018] propofol    . vancomycin Stopped (02/16/18 1303)   PRN Meds:.sodium chloride, sodium chloride, acetaminophen (TYLENOL) oral liquid 160 mg/5 mL, fentaNYL (SUBLIMAZE) injection, hydrALAZINE, iopamidol, labetalol, sodium chloride flush  Review of Systems: Review of Systems  Unable to perform ROS: Intubated    Allergies  Allergen Reactions  . Oxycodone     confusion    OBJECTIVE: Vitals:   02/16/18 0900 02/16/18 1000 02/16/18 1111 02/16/18 1200  BP: (!) 135/59  Pulse: (!) 109 (!) 106  (!) 116  Resp: (!) 22 (!) 25  (!) 23  Temp: (!) 100.8 F (38.2 C) (!) 100.8 F (38.2 C)  (!) 100.9 F (38.3 C)  TempSrc:      SpO2: 100% 100%  99%  Weight:   57 kg   Height:       Body mass index is 22.26 kg/m.  Physical Exam  Constitutional:  She remains intubated.  Cardiovascular: Normal rate, regular rhythm and normal heart sounds.  No murmur heard. Pulmonary/Chest: She has no wheezes. She has no rales.  Lungs are clear anteriorly.  She is requiring relatively little suctioning.    Abdominal: Soft. She exhibits no distension.  No more purulent drainage from her  abdominal wound. There is a scant amount of clear yellow drainage with blood clots in her JP drain.  Skin: No rash noted.  Left arm PICC site looks good.    Lab Results Lab Results  Component Value Date   WBC 6.7 02/16/2018   HGB 8.0 (L) 02/16/2018   HCT 25.6 (L) 02/16/2018   MCV 81.5 02/16/2018   PLT 275 02/16/2018    Lab Results  Component Value Date   CREATININE 0.64 02/15/2018   BUN 18 02/15/2018   NA 144 02/15/2018   K 2.7 (LL) 02/15/2018   CL 86 (L) 02/15/2018   CO2 43 (H) 02/15/2018    Lab Results  Component Value Date   ALT 69 (H) 02/12/2018   AST 60 (H) 02/12/2018   ALKPHOS 214 (H) 02/12/2018   BILITOT 0.7 02/12/2018     Microbiology: Recent Results (from the past 240 hour(s))  Aerobic Culture (superficial specimen)     Status: None   Collection Time: 02/10/18 10:56 AM  Result Value Ref Range Status   Specimen Description   Final    ABDOMEN Performed at [facility], [place] [address], [place], [place] [address]    Special Requests   Final    NONE Performed at [facility], [place] [address] [place], [place] [address]    Gram Stain   Final    RARE WBC PRESENT, PREDOMINANTLY PMN NO ORGANISMS SEEN    Culture   Final    FEW LACTOBACILLUS SPECIES Standardized susceptibility testing for this organism is not available. Performed at [facility], [place] [address] [place], [place] [address]    Report Status 02/14/2018 FINAL  Final  Aerobic/Anaerobic Culture (surgical/deep wound)     Status: None (Preliminary result)   Collection Time: 02/13/18 11:44 AM  Result Value Ref Range Status   Specimen Description JP DRAINAGE  Final   Special Requests NONE  Final   Gram Stain   Final    FEW WBC PRESENT, PREDOMINANTLY PMN MODERATE GRAM NEGATIVE RODS FEW GRAM POSITIVE RODS    Culture   Final    CULTURE REINCUBATED FOR BETTER GROWTH HOLDING FOR POSSIBLE ANAEROBE Performed at [facility], [place] [address]  [place], [place] [address]    Report Status PENDING  Incomplete    Michel Bickers, MD [facility] [place] [phone] pager   [phone] cell 02/16/2018, 2:45 PM

## 2018-02-16 NOTE — Progress Notes (Signed)
Gynecologic Oncology Progress Note  72 year old female diagnosed with Stage IV ovarian cancer who received four cycles of neoadjuvant chemotherapy with carboplatin and taxol with fourth cycle completed on 12/20/17 .  She underwent interval debulking surgery with Dr. Skeet Latch on 01/27/18 consisting of an exploratory laparotomy infra-gastric omentectomy bilateral salpingo-oophorectomy radical optimal tumor debulking, resection of anterior abdominal wall.  Her post-operative course was complicated by altered mental status noted on POD1 with improvement after Narcan administration but with return of symptoms, acute renal injury, tachycardia, dyspnea (CT chest performed 01/30/18-neg for PE), anemia.  Her AKI and altered mental status appeared to improve on 01/30/18 in the am but symptoms worsened the evening of 01/30/18 with episodes of emesis, tachycardia, hypertension, and confusion. A CT scan on 01/31/18 revealed findings most consistent with ischemic bowel with portal venous gas and bowel pneumatosis and large amount of pneumoperitoneum with concerns for peritonitis.  She was taken back to the OR on 01/31/18 with operative findings of free air and large volume ascites/fluid, but unable to visualize site of bowel perforation, no evidence of ischemia. It was felt that aspiration most likely occurred during intubation.        Subjective: Patient intubated but alert, eyes open, turning head.  Husband at the bedside asking about the results from the chest xray performed on Monday.  Objective: Vital signs in last 24 hours: Temp:  [99.5 F (37.5 C)-100.9 F (38.3 C)] 100.9 F (38.3 C) (09/11 1200) Pulse Rate:  [90-116] 116 (09/11 1200) Resp:  [15-39] 23 (09/11 1200) BP: (108-142)/(35-59) 135/59 (09/11 0900) SpO2:  [97 %-100 %] 99 % (09/11 1200) FiO2 (%):  [30 %] 30 % (09/11 1134) Weight:  [125 lb 10.6 oz (57 kg)] 125 lb 10.6 oz (57 kg) (09/11 1111) Last BM Date: (flexiseal)  Intake/Output from previous  day: 09/10 0701 - 09/11 0700 In: 1644.6 [I.V.:365.8; NG/GT:550; IV Piggyback:728.8] Out: 1910 [Urine:1710; Stool:200]  Physical Examination: General: appears comfortable, alert, turning head to direction of stimuli. Appearing to be oriented to person. Cardio: Mildly tachycardic at 106. Resp: Coarse breath sounds. On vent GI: Abdomen minimally distended, active bowel sounds.  Flexiseal in place with liquid brown stool.  Dressing changed with Dr. Gerarda Fraction present.  Granulation tissue noted on the fascia.  Packing with saline dampened kerliz.  LLQ JP stripped with fibrinous tissue present.  There is no leaking around the tubing entry site. Prior JP sites are healing with no drainage or erythema. Upper aspect of the incision with steri strips in place. Extremities: generalized edema improved.  Port a cath dressing intact. Significant temporal wasting noted.   Labs: WBC/Hgb/Hct/Plts:  6.7/8.0/25.6/275 (09/11 0455)    Assessment: 72 y.o. s/p Procedure(s): Interval debulking surgery with Dr. Skeet Latch on 01/27/18 consisting of an exploratory laparotomy infra-gastric omentectomy bilateral salpingo-oophorectomy radical optimal tumor debulking, resection of anterior abdominal wall.  Reoperation of EXPLORATORY LAPAROTOMY, ABDOMINAL WASHOUT on 01/31/18:   Heme:  Post-operative anemia: Hgb 8.0 and Hct 25.6 this am. Stable currently. Last transfusion 02/02/18.  Anemia related to acute critical illness, no evidence of active bleeding.   ID: Low grade fever continues with max temp of 100.9 on 02/16/18. Presumed sepsis initially from gastrointestinal source but unable to ID the perforation after exp lap on 01/31/18. Contribution now from pneumonia and possibly undrained left pelvic fluid collection seen on CT scan 02/07/18; however the pelvic collection is improving. 02/13/18 enteric versus old hematoma appearing contents in JP. Cultures showing FEW WBC PRESENT, PREDOMINANTLY PMN MODERATE GRAM NEGATIVE RODS  FEW GRAM  POSITIVE RODS. CT from Friday did not show a leak.   Repeat CT 02/14/18: Similar to slight decrease in size of ill-defined complex fluid and gas in the lateral left hemipelvis. 2. No evidence of contrast communication between the thickened sigmoid and this pelvic sidewall process. 3. Foley catheter and air within the urinary bladder, which is not decompressed. Correlate with catheter function.   WBC normalized at 8. Granix received 02/01/18 per Dr. Alvy Bimler.  Vancomycin stopped on 02/04/18 and then fevers began again. Restarted Vancomycin on 02/06/18. Anidulafungin initiated 02/01/18.  Meropenum IV started 01/31/18.  Blood cultures from 01/31/18 & 02/06/18 with no growth. Tracheal aspirate from 02/01/18 resulting few gram positive rods. Repeated 02/06/18 showed few yeast.  IR feels no safe window for drainage of pelvic fluid collection.  Repeat imaging showed improvement.  Appreciate ID input (no changes to regimen recommended at this time).  Wound culture from abdominal incision with no growth to date. JP drain fluid pending with gram stain showing few WBC, moderate gram neg rods, few gram positive rods  CV: Tachycardia per CC. BP sensitive with changes in sedation. Telemetry, diuresis per CC, lopressor, and labetalol ordered PRN.  Resp: ARDS in setting of aspiration pneumonia and sepsis.   Chest 1 view 9/9 in afternoon: IMPRESSION: Slight improvement in the aeration of the lungs with patchy bilateral airspace opacities. - On full vent support.  Failed SBT. - Tracheostomy to be performed as soon as tomorrow.  GI:   -CT AP on 01/31/18 with findings consistent with GI perforation.  No obvious perforation identified on re-operation on 01/31/18. GI consultation appreciated.  -TPN initiated 02/02/18 and discontinued 02/10/18. Tube feedings running continuously -Loose bowel movements in Flexiseal -Repeat CT 9/6 showed no worsening (in fact decrease size) of pelvic collection. No leak.  -02/13/18 -Enteric versus old  hematoma contents in LLQ JP. CT with rectal contrast and oral contrast this am. -CT pelvis 02/14/18: Stable to slight decrease in pelvic fluid collection, no evidence of contrast communication. -JP fluid culture from 02/13/18:  FEW WBC PRESENT, PREDOMINANTLY PMN  MODERATE GRAM NEGATIVE RODS FEW GRAM POSITIVE RODS  GU: AKI post-op resolved.  Foley in place with yellow urine.  Diuresis per CC.  FEN:  IVF per CC.  Replacement per CC- Phos 2.2 this am, Mag 1.9   Prophylaxis: SCDs in place. Continue Lovenox prophylaxis. No active bleeding and H&H stable.  Plan: Continue with tube feeding.  Monitor JP drain for changes in fluid. Continue care per Critical Care and Dr. Gerarda Fraction. Continue wet to dry dressing changes twice daily.     Melissa D Cross 02/16/2018, 12:28 PM

## 2018-02-17 ENCOUNTER — Inpatient Hospital Stay (HOSPITAL_COMMUNITY): Payer: Medicare PPO

## 2018-02-17 ENCOUNTER — Encounter (HOSPITAL_COMMUNITY): Payer: Medicare PPO

## 2018-02-17 LAB — BASIC METABOLIC PANEL
ANION GAP: 11 (ref 5–15)
BUN: 14 mg/dL (ref 8–23)
CHLORIDE: 87 mmol/L — AB (ref 98–111)
CO2: 45 mmol/L — ABNORMAL HIGH (ref 22–32)
Calcium: 8.2 mg/dL — ABNORMAL LOW (ref 8.9–10.3)
Creatinine, Ser: 0.48 mg/dL (ref 0.44–1.00)
GFR calc Af Amer: >60 mL/min (ref >60)
GLUCOSE: 133 mg/dL — AB (ref 70–99)
POTASSIUM: 2.6 mmol/L — AB (ref 3.5–5.1)
Sodium: 143 mmol/L (ref 135–145)

## 2018-02-17 LAB — CBC WITH DIFFERENTIAL/PLATELET
Basophils Absolute: 0 10*3/uL (ref 0.0–0.1)
Basophils Relative: 0 %
EOS PCT: 1 %
Eosinophils Absolute: 0.1 10*3/uL (ref 0.0–0.7)
HEMATOCRIT: 27.3 % — AB (ref 36.0–46.0)
Hemoglobin: 8.6 g/dL — ABNORMAL LOW (ref 12.0–15.0)
LYMPHS ABS: 0.9 10*3/uL (ref 0.7–4.0)
LYMPHS PCT: 9 %
MCH: 25.7 pg — ABNORMAL LOW (ref 26.0–34.0)
MCHC: 31.5 g/dL (ref 30.0–36.0)
MCV: 81.7 fL (ref 78.0–100.0)
MONO ABS: 0.6 10*3/uL (ref 0.1–1.0)
Monocytes Relative: 7 %
NEUTROS ABS: 7.6 10*3/uL (ref 1.7–7.7)
Neutrophils Relative %: 83 %
Platelets: 313 10*3/uL (ref 150–400)
RBC: 3.34 MIL/uL — AB (ref 3.87–5.11)
RDW: 23.7 % — AB (ref 11.5–15.5)
WBC: 9.2 10*3/uL (ref 4.0–10.5)

## 2018-02-17 LAB — C DIFFICILE QUICK SCREEN W PCR REFLEX
C DIFFICILE (CDIFF) INTERP: NOT DETECTED
C DIFFICILE (CDIFF) TOXIN: NEGATIVE
C Diff antigen: NEGATIVE

## 2018-02-17 LAB — GLUCOSE, CAPILLARY
GLUCOSE-CAPILLARY: 28 mg/dL — AB (ref 70–99)
GLUCOSE-CAPILLARY: 168 mg/dL — AB (ref 70–99)
Glucose-Capillary: 120 mg/dL — ABNORMAL HIGH (ref 70–99)
Glucose-Capillary: 105 mg/dL — ABNORMAL HIGH (ref 70–99)
Glucose-Capillary: 102 mg/dL — ABNORMAL HIGH (ref 70–99)
Glucose-Capillary: 112 mg/dL — ABNORMAL HIGH (ref 70–99)
Glucose-Capillary: 106 mg/dL — ABNORMAL HIGH (ref 70–99)
Glucose-Capillary: 110 mg/dL — ABNORMAL HIGH (ref 70–99)

## 2018-02-17 LAB — PROTIME-INR
INR: 1.28
INR: 1.28
PROTHROMBIN TIME: 15.9 s — AB (ref 11.4–15.2)
PROTHROMBIN TIME: 15.9 s — AB (ref 11.4–15.2)

## 2018-02-17 LAB — MAGNESIUM: Magnesium: 1.7 mg/dL (ref 1.7–2.4)

## 2018-02-17 LAB — PHOSPHORUS: PHOSPHORUS: 2.7 mg/dL (ref 2.5–4.6)

## 2018-02-17 MED ORDER — DEXTROSE 50 % IV SOLN
INTRAVENOUS | Status: AC
  Administered 2018-02-17: 50 mL
  Filled 2018-02-17: qty 50

## 2018-02-17 MED ORDER — SODIUM CHLORIDE 0.9 % IV BOLUS
500.0000 mL | Freq: Once | INTRAVENOUS | Status: AC
  Administered 2018-02-17: 500 mL via INTRAVENOUS

## 2018-02-17 MED ORDER — POTASSIUM CHLORIDE 20 MEQ/15ML (10%) PO SOLN
40.0000 meq | Freq: Two times a day (BID) | ORAL | Status: AC
  Administered 2018-02-17 (×2): 40 meq
  Filled 2018-02-17 (×2): qty 30

## 2018-02-17 MED ORDER — ENOXAPARIN SODIUM 40 MG/0.4ML ~~LOC~~ SOLN
40.0000 mg | SUBCUTANEOUS | Status: DC
  Administered 2018-02-17 – 2018-03-03 (×15): 40 mg via SUBCUTANEOUS
  Filled 2018-02-17 (×15): qty 0.4

## 2018-02-17 MED ORDER — MAGNESIUM SULFATE 2 GM/50ML IV SOLN
2.0000 g | Freq: Once | INTRAVENOUS | Status: AC
  Administered 2018-02-17: 2 g via INTRAVENOUS
  Filled 2018-02-17: qty 50

## 2018-02-17 MED ORDER — NOREPINEPHRINE 4 MG/250ML-% IV SOLN
0.0000 ug/min | INTRAVENOUS | Status: DC
  Filled 2018-02-17 (×3): qty 250

## 2018-02-17 NOTE — Progress Notes (Addendum)
Gynecologic Oncology Progress Note  72 year old female diagnosed with Stage IV ovarian cancer who received four cycles of neoadjuvant chemotherapy with carboplatin and taxol with fourth cycle completed on 12/20/17 .  She underwent interval debulking surgery with Dr. Skeet Latch on 01/27/18 consisting of an exploratory laparotomy infra-gastric omentectomy bilateral salpingo-oophorectomy radical optimal tumor debulking, resection of anterior abdominal wall.  Her post-operative course was complicated by altered mental status noted on POD1 with improvement after Narcan administration but with return of symptoms, acute renal injury, tachycardia, dyspnea (CT chest performed 01/30/18-neg for PE), anemia.  Her AKI and altered mental status appeared to improve on 01/30/18 in the am but symptoms worsened the evening of 01/30/18 with episodes of emesis, tachycardia, hypertension, and confusion. A CT scan on 01/31/18 revealed findings most consistent with ischemic bowel with portal venous gas and bowel pneumatosis and large amount of pneumoperitoneum with concerns for peritonitis.  She was taken back to the OR on 01/31/18 with operative findings of free air and large volume ascites/fluid, but unable to visualize site of bowel perforation, no evidence of ischemia. It was felt that aspiration most likely occurred during intubation.        Subjective: Patient asleep. Just had trach placed.  Tolerated well per RN.  No family at the bedside. RN at the bedside.   Objective: Vital signs in last 24 hours: Temp:  [98.6 F (37 C)-102.2 F (39 C)] 99.9 F (37.7 C) (09/12 1430) Pulse Rate:  [93-126] 100 (09/12 1430) Resp:  [5-29] 20 (09/12 1430) BP: (91-241)/(32-97) 101/38 (09/12 1430) SpO2:  [96 %-100 %] 96 % (09/12 1430) FiO2 (%):  [30 %] 30 % (09/12 1357) Weight:  [129 lb 10.1 oz (58.8 kg)] 129 lb 10.1 oz (58.8 kg) (09/12 0500) Last BM Date: (flexiseal )  Intake/Output from previous day: 09/11 0701 - 09/12 0700 In: 2381.1  [I.V.:474; NG/GT:930; IV Piggyback:977.1] Out: 2800 [Urine:2540; Drains:10; Stool:250]  Physical Examination: General: asleep, appears comfortable Cardio: Mildly tachycardic at 100 per monitor.  No disposable stethoscope in the room. Resp: s/p trach GI: Abdomen minimally distended.  Flexiseal in place with liquid brown stool.  Dressing changed without difficulty.  Granulation tissue noted on the fascia.  Packing with saline dampened kerliz.  LLQ JP stripped with fibrinous tissue present. Fluid in JP remains serous.  There is no leaking around the tubing entry site. Prior JP sites are healing with no drainage or erythema. Upper aspect of the incision with steri strips in place. Extremities: generalized edema.  Port a cath dressing intact. Significant temporal wasting noted.   Labs: WBC/Hgb/Hct/Plts:  9.2/8.6/27.3/313 (09/12 0500) BUN/Cr/glu/ALT/AST/amyl/lip:  14/0.48/--/--/--/--/-- (09/12 9675)  Assessment: 72 y.o. s/p Procedure(s): Interval debulking surgery with Dr. Skeet Latch on 01/27/18 consisting of an exploratory laparotomy infra-gastric omentectomy bilateral salpingo-oophorectomy radical optimal tumor debulking, resection of anterior abdominal wall.  Reoperation of EXPLORATORY LAPAROTOMY, ABDOMINAL WASHOUT on 01/31/18:   Heme:  Post-operative anemia: Hgb 8.6 and Hct 27.3 this am. Stable currently. Last transfusion 02/02/18.  Anemia related to acute critical illness, no evidence of active bleeding.   ID: Fever as high as 102.2 on 02/17/18. RN at bedside sending urine for culture, sputum culture, stool for c diff. Blood cultures today.  Presumed sepsis initially from gastrointestinal source but unable to ID the perforation after exp lap on 01/31/18. Contribution now from pneumonia and possibly undrained left pelvic fluid collection seen on CT scan 02/07/18; however the pelvic collection is improving. 02/13/18 enteric versus old hematoma appearing contents in JP. Cultures showing  FEW WBC PRESENT,  PREDOMINANTLY PMN MODERATE GRAM NEGATIVE RODS FEW GRAM POSITIVE RODS. CT from Friday did not show a leak.   Repeat CT 02/14/18: Similar to slight decrease in size of ill-defined complex fluid and gas in the lateral left hemipelvis. 2. No evidence of contrast communication between the thickened sigmoid and this pelvic sidewall process. 3. Foley catheter and air within the urinary bladder, which is not decompressed. Correlate with catheter function.   WBC 9.2 this am. Granix received 02/01/18 per Dr. Alvy Bimler.  Vancomycin stopped on 02/04/18 and then fevers began again. Restarted Vancomycin on 02/06/18. Anidulafungin initiated 02/01/18.  Meropenum IV started 01/31/18 and discontinued on 02/16/18.  Unasyn started 02/16/18.  Blood cultures from 01/31/18 & 02/06/18 with no growth. Tracheal aspirate from 02/01/18 resulting few gram positive rods. Repeated 02/06/18 showed few yeast.  IR feels no safe window for drainage of pelvic fluid collection.  Repeat imaging showed improvement.  Appreciate ID input (no changes to regimen recommended at this time).  Wound culture from abdominal incision with no growth to date. JP drain fluid pending with gram stain showing few WBC, moderate gram neg rods, few gram positive rods  CV: Tachycardia per CC. BP sensitive with changes in sedation. Telemetry, diuresis per CC, lopressor, and labetalol ordered PRN.  Resp: ARDS in setting of aspiration pneumonia and sepsis.   Chest 1 view 9/12  IMPRESSION: 1. Increasing opacities bilaterally suspicious for multifocal pneumonia. 2. Endotracheal tube removed with placement of tracheostomy. 3. Placement of left PICC line with the tip at the expected SVC-RA junction. - Tracheostomy placed today.  GI: Checking stool for c diff per ID. Tube feedings on hold for trach placement.   -CT AP on 01/31/18 with findings consistent with GI perforation.  No obvious perforation identified on re-operation on 01/31/18. GI consultation appreciated.  -TPN  initiated 02/02/18 and discontinued 02/10/18. Tube feedings running continuously -Loose bowel movements in Flexiseal -Repeat CT 9/6 showed no worsening (in fact decrease size) of pelvic collection. No leak.  -02/13/18 -Enteric versus old hematoma contents in LLQ JP. CT with rectal contrast and oral contrast this am. -CT pelvis 02/14/18: Stable to slight decrease in pelvic fluid collection, no evidence of contrast communication. -JP fluid culture from 02/13/18:  FEW WBC PRESENT, PREDOMINANTLY PMN  MODERATE GRAM NEGATIVE RODS FEW GRAM POSITIVE RODS  GU: AKI post-op resolved.  Foley in place with yellow urine.  Diuresis per CC. Creatinine 0.48 this am  FEN:  IVF per CC.  Replacement per CC- K+ 2.6 Phos 2.7 this am, Mag 1.7   Prophylaxis: SCDs in place. Continue Lovenox prophylaxis. No active bleeding and H&H stable.  Plan: Continue care per Critical Care and Dr. Gerarda Fraction. Continue wet to dry dressing changes twice daily.     Bacilio Abascal D Ayaansh Smail 02/17/2018, 2:54 PM

## 2018-02-17 NOTE — Progress Notes (Signed)
CRITICAL VALUE ALERT  Critical Value:  Potassium 2.6  Date & Time Notied:  02/17/2018; 0930  Provider Notified: Noe Gens, NP   Orders Received/Actions taken: Potassium per tube, IVPB

## 2018-02-17 NOTE — Procedures (Signed)
Percutaneous Tracheostomy Placement  Consent from family.  Patient sedated, paralyzed and position.  Placed on 100% FiO2 and RR matched.  Area cleaned and draped.  Lidocaine/epi injected.  Skin incision done followed by blunt dissection.  Trachea palpated then punctured, catheter passed and visualized bronchoscopically.  Wire placed and visualized.  Catheter removed.  Airway then entered and dilated.  Size 6 cuffed shiley trach placed and visualized bronchoscopically well above carina.  Good volume returns.  Patient tolerated the procedure well without complications.  Minimal blood loss.  CXR ordered and pending.  Jo Parker G. Makhya Arave, M.D. [facility]. Pager: [phone]. After hours pager: [phone].  

## 2018-02-17 NOTE — Progress Notes (Signed)
Wasted Etomidate 20mg , Fentanyl 179mcg, and versed 2mg  in sink with Westley Foots. These medications were not used for tracheostomy procedure.

## 2018-02-17 NOTE — Procedures (Signed)
Bronchoscopy Procedure Note Jo Parker [id number] [birth year]  Procedure: Bronchoscopy Indications: Acute respiratory failure and inability to wean from vent.  Procedure Details Consent: Risks of procedure as well as the alternatives and risks of each were explained to the (patient/caregiver).  Consent for procedure obtained. Time Out: Verified patient identification, verified procedure, site/side was marked, verified correct patient position, special equipment/implants available, medications/allergies/relevent history reviewed, required imaging and test results available.  Performed  Performed in ICU.  Vent placed on 100% FiO2.  Bronchoscope entered through ETT and advanced to carina.  Rt upper, middle, lower lobes visualized.  Lt upper, lingular and lower lobes visualized.  No endobronchial lesions.  Bronchoscope withdrawn into endotracheal tube.  Tube retracted from 22 to 18 cm at lip.  Visualized finder needle, guidewire, dilators, and # 6 tracheostomy entering airway.  No evidence for posterior wall injury or bleeding.    Bronchoscope transferred from endotracheal tube to tracheostomy.  This was visualized in good position above the carina without evidence for bleeding.  Tracheostomy connected to ventilator.  She had transient hypotension during the procedure that resolved with fluid bolus.  No other immediate complications.  Post procedure chest xray pending.  Chesley Mires, MD [facility] 02/17/2018, 1:35 PM

## 2018-02-17 NOTE — Progress Notes (Signed)
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  She is completed 3 cycles of chemotherapy and is now status post bilateral salpingo-oophorectomy and omentectomy with debulking on 8/22.   8/26 transferred to ICU w/ concern for perforation w/ free air noted.  Imipenem started. Went for exploratory laparotomy with washout.  No significant contamination identified, presumed spontaneously closed microperf. Returned with abdominal drains postoperative chest x-ray showing evidence of aspiration pneumonia; mixed respiratory and metabolic acidosis & in septic shock. Required intubation.   8/27 GI medicine consulted, did not feel she had overt GIB.  Rec's prophylaxis with PPI.  Signed off.  Weaning pressors. Hgb dropped from 8.8 to 7.4; got 2 units of blood,  febrile, added. vancomycin.  Neupogen given for neutropenia.   8/28: Pressors off/shock resolved. NAG acidosis and Lactic acidosis resolved.  FiO2/PEEP down, attempted PSV.  WBC count up.  Started TPN.   8/29: FiO2 and PEEP requirements increased, had wean these down to 40/5 now 60/10.  Chest x-ray with worsening right-sided airspace disease likely reflecting de-recruitment. Delirium remains significant obstacle.  Beta-blocker, and clonidine patch added for hypertension   8/30: Diuresed 4.5 L the day prior with Lasix.  Little more awake.  X-ray improved.  Vancomycin discontinued.  Continuing diuresis, RASS goal changed to -1, dropping PEEP to 8.  9/01:  Vanco added back with fevers, clonidine patch  9/02-9/6: Delirium. Sedation difficult, diuresing, remains on abx. spiking temps, loculated fluid collection identified in left pelvis. Not amedable to drainage.  ID consulted. Started seroquel. 9/4. Upper 2 drains  removed. Staples removed.from incision site.  9/4: Infectious disease.  Consulted by family.  Had no additional rec's  9/6: WBC curve normalized but still having low gd fever and purulent drainage from wound. CT shows abd fluid collection better  9/7-9/8: Failing pressure support weans.  9/9 febrile 101F. WBC ok. Not on pressors. Not on sedation gtt. Husband deferring decisions about care to son (newly married). Suspicion for per > CT ABD with decrease in fluid collection   9/10  Sister at bedside - says she is not in favor of trach but says is the decision of son/dpoa. Son met with Dr Alvy Bimler for goals of care - current status precludes chemo but goal is trach and full code and full medical care and later chemo when ECOG improves.  Low grade fever persists - ID following. More awake today. No Perf leak on CT abd and restarted TF  9/11 Ongoing fever.  Failed SBT.  Pending trach 9/12  Subjective / interval events  RN reports pending trach 9/12 at noon, low K / replaced.  TF off for procedure.    Objective    Blood pressure (!) 98/35, pulse (!) 114, temperature 100 F (37.8 C), resp. rate (!) 5, height _0  (1.6 m), weight 58.8 kg, SpO2 100 %.    Vent Mode: PRVC FiO2 (%):  [30 %] 30 % Set Rate:  [20 bmp] 20 bmp Vt Set:  [330 mL] 330 mL PEEP:  [5 cmH20] 5 cmH20 Pressure Support:  [0 cmH20] 0 cmH20 Plateau Pressure:  [7 cmH20-25 cmH20] 19 cmH20   Intake/Output Summary (Last 24 hours) at 02/17/2018 1112 Last data filed at 02/17/2018 0900 Gross per 24 hour  Intake 2391.11 ml  Output  1950 ml  Net 441.11 ml   Filed Weights   02/16/18 0600 02/16/18 1111 02/17/18 0500  Weight: 57 kg 57 kg 58.8 kg     EXAM  General:  Cachectic female, critically ill appearing but in NAD lying in bed HEENT: MM pink/moist, trach midline c/d/i  Neuro: opens eyes to voice, no follow commands, no attempt at communication  CV: s1s2 rrr, 2-3/6 SEM heard best at 5th ICS mid-clavicular line PULM:  even/non-labored, lungs bilaterally clear anterior CC:EQFD, non-tender, bsx4 active  Extremities: warm/dry, trace to 1+ generalized extremity edema  Skin: no rashes or lesions  Labs    PULMONARY No results for input(s): PHART, PCO2ART, PO2ART, HCO3, TCO2, O2SAT in the last 168 hours.  Invalid input(s): PCO2, PO2  CBC Recent Labs  Lab 02/15/18 0554 02/16/18 0455 02/17/18 0500  HGB 8.1* 8.0* 8.6*  HCT 26.3* 25.6* 27.3*  WBC 8.0 6.7 9.2  PLT 264 275 313    COAGULATION Recent Labs  Lab 02/17/18 0500 02/17/18 0838  INR 1.28 1.28   CARDIAC   No results for input(s): TROPONINI in the last 168 hours. No results for input(s): PROBNP in the last 168 hours.  CHEMISTRY Recent Labs  Lab 02/12/18 0629 02/14/18 0437 02/15/18 0037 02/15/18 0554 02/16/18 0455 02/17/18 0500 02/17/18 0838  NA 144  --  144  --   --   --  143  K 3.3*  --  2.7*  --   --   --  2.6*  CL 91*  --  86*  --   --   --  87*  CO2 41*  --  43*  --   --   --  45*  GLUCOSE 142*  --  99  --   --   --  133*  BUN 30*  --  18  --   --   --  14  CREATININE 0.64 0.53 0.64  --   --   --  0.48  CALCIUM 8.4*  --  8.3*  --   --   --  8.2*  MG  --   --   --  1.3* 1.9 1.7  --   PHOS  --   --   --  4.3 2.2* 2.7  --    Estimated Creatinine Clearance: 52.6 mL/min (by C-G formula based on SCr of 0.48 mg/dL).  LIVER Recent Labs  Lab 02/12/18 0629 02/17/18 0500 02/17/18 0838  AST 60*  --   --   ALT 69*  --   --   ALKPHOS 214*  --   --   BILITOT 0.7  --   --   PROT 6.8  --   --   ALBUMIN 1.6*  --   --   INR  --  1.28 1.28   INFECTIOUS No results for input(s): LATICACIDVEN, PROCALCITON in the last 168 hours.  ENDOCRINE CBG (last 3)  Recent Labs    02/17/18 0414 02/17/18 0613 02/17/18 0748  GLUCAP 120* 105* 102*   IMAGING  Dg Abd 1 View  Result Date: 02/17/2018 CLINICAL DATA:  Nasal/orogastric tube placement. EXAM: ABDOMEN - 1 VIEW COMPARISON:  Chest radiograph, 02/14/2018. FINDINGS: Nasal/orogastric  tube extends below the hemidiaphragms projecting in the mid stomach. Endotracheal tube, left PICC and right anterior chest wall Port-A-Cath are stable and well positioned. Irregularly thickened bronchovascular markings and hazy airspace opacity in the right mid lung is without significant change from the most recent prior study allowing for differences in patient positioning.  Bilateral IMPRESSION: 1. Nasal/orogastric tube tip projects in the mid stomach, well positioned. Electronically Signed   By: Lajean Manes M.D.   On: 02/17/2018 10:45   Micro Data: Blood Culture x2 8/26 >> neg Sputum 8/27: Abundant yeast, few Candida albicans Blood Cx 9/1 >> negative Respiratory 9/1 >> few Candida Urine 9/1 >> negative Peritoneal fluid 8/31 >> neg Abd Drainage 9/5 >> few lactobacillus  JP Drainage 9/8 >> E-Coli C-Diff 9/12 >>  Antimicrobials:  Meropenem 8/26 >> 9/11 Vancomycin 8/27 >> 8/30; 9/1 >> 9/11 Eraxis 8/27 >>  Unasyn 9/11 >>   Studies: CT abdomen 8/26: Hemic bowel with portal venous gas and bowel pneumatosis with large amount of pneumoperitoneum.  Unclear as to site of perforation.  Multiple pockets of air adjacent to the sigmoid colon.  Enhancement of the peritoneum concerning for peritonitis.  There was aortic arterial sclerosis.  Echocardiogram 8/27: Particular ejection fraction 65 to 70%.  She has moderate LVH, and hyperdynamic LV function.  There is grade 1 diastolic dysfunction.  Trivial MR.  Normal left atrial size.  The IV C was not visualized  CT abdomen 9/2 >> drainage catheters within the pelvis, RUQ, LUQ.  Significant interval decrease in size of previously described intra-abdominal fluid, no residual free intraperitoneal air. There is a persistent loculated fluid & gas collection along the left pelvic sidewall measuring up to 10 cm.  Moderate bilateral pleural effusions, underlying opacities favored to represent atelectasis.  Marked wall thickening of the sigmoid colon.  Peripheral  low attenuation within the spleen concerning for splenic infarcts.  Wall thickening of the urinary bladder.   CT abdomen 9/6: Improving left lateral pelvic fluid collection, resolved peritoneal fluid in the left lower abdomen.  Concentric thickening of the sigmoid colon.  Decreased bilateral effusion and atelectasis.  CT ABD 9/9 Similar to slight decrease in size of ill-defined complex fluid and gas in the lateral left hemipelvis. 2. No evidence of contrast communication between the thickened sigmoid and this pelvic sidewall process. 3. Foley catheter and air within theurinary bladder, which is not decompressed. Correlate with catheter function.   Assessment & Plan:   Status post exploratory laparotomy for free air on 8/26 ; following bilateral salpingo-oophorectomy, omentectomy, and debulking surgery on 8/22. Developed fluid collections not amenable to IR drainage.  Reducing in size on CT.P: P: Recommendations per GYN/ONC Follow JP drainage  ABX narrowed for cultures   Severe Sepsis - with neutropenia in setting of peritonitis (microperforation), complicated by aspiration pneumonia.  P: Antibiotics per ID  Follow fever curve / WBC trend Follow up C-Diff assessment   Acute hypoxemic respiratory failure in the setting of aspiration pneumonia / ARDS.   Right pleural effusion- No sig pocket to drain on bedside US 9/5 P: PRVC 8cc/kg  Wean O2 as needed for sats >90% Trach care per protocol post placement Follow intermittent CXR SBT as able > delirium / mental status remains barrier to SBT  Plan for trach 9/12 per Dr. Nelda Marseille   Sinus tachycardia Hypertension Grade 1 diastolic dysfunction  Elevated Troponin - initially, resolved.  Likely demand Bradycardia - periods of brady to the 30's Labile BP - w/episodes of hypotension seemingly exacerbated by sedation P: Lopressor 54m IV Q6, hold for SBP <90 PRN hydralazine  Lasix 40 mg IV Q12  Tele monitoring   Acute metabolic encephalopathy  - in setting of critical illness, sedation P: RASS Goal: 0 to -1  Minimize sedating medications as able PRN versed, PRN fentanyl  Seroquel, paxil  Electrolyte Disturbances - hypokalemia, hypomagnesemia  P: Trend BMP / urinary output Replace electrolytes as indicated Avoid nephrotoxic agents, ensure adequate renal perfusion  Severe Protein Calorie Malnutrition, prealbumin 3.5 P: Continue TF  PPI for stress ulcer prophylaxis   Postoperative anemia.  P: Trend CBC  Monitor for bleeding  Transfuse per ICU guidelines  Leukocytosis - initially was neutropenic however did receive Granix on 8/28 per oncology; has normalized once again as of 9/6 P: Monitor   Thrombocytopenia - improved P: Monitor   Hyperglycemia P: SSI     Best practice/Goals of care  DVT prophylaxis: LMWH GI prophylaxis: PPI Diet: TF  Mobility: BR Code Status: Full Code Family Communication:  Husband and son updated at bedside 9/12  Disposition / Summary of Today's Plan 02/17/2018    To remain in ICU, pending trach.  Concern for overall poor functional recovery. Plan of care for ongoing aggressive support as per family wishes.      Noe Gens, NP-C [facility] Pgr: [phone] or if no answer [phone] 02/17/2018, 11:13 AM

## 2018-02-17 NOTE — Progress Notes (Signed)
Patient ID: Jo Parker, female   DOB: 10-11-1945, 72 y.o.   MRN: [id number]         [facility] Infectious Disease  Date of Admission:  01/27/2018           Day 18 vancomycin        Day 18 anidulafungin        Day 1 ampicillin sulbactam ASSESSMENT: Her temperature was up to 102.2 degrees overnight.  Obviously, there are multiple possible sources for fever.  Yesterday, I changed meropenem to ampicillin sulbactam after her abdominal drain culture grew a very sensitive E. coli.  I will repeat blood, urine and sputum cultures now and send stool for C. difficile screen.  I have discussed the situation with her sons, Jo Parker and Jo Parker, who are at the bedside.  PLAN: 1. Continue current antimicrobial therapy 2. Blood, urine and sputum cultures 3. Stool for C. difficile screen  Principal Problem:   Pelvic abscess in female Active Problems:   Severe sepsis ([place])   Peritonitis ([place])   HCAP (healthcare-associated pneumonia)   Postoperative wound infection   Primary peritoneal carcinomatosis ([place])   Ovarian cancer ([place])   Status post bilateral salpingo-oophorectomy   Severe protein-calorie malnutrition ([name])   COPD (chronic obstructive pulmonary disease) ([name])   HLD (hyperlipidemia)   Absolute anemia   White coat syndrome with hypertension   AKI (acute kidney injury) ([place])   Acute metabolic encephalopathy   Acute respiratory distress   Low urine output   Somnolence   At risk for fluid volume overload   Free intraperitoneal air   Acute respiratory failure ([name])   Pressure injury of skin   Aspiration pneumonia ([name])   Scheduled Meds: . chlorhexidine gluconate (MEDLINE KIT)  15 mL Mouth Rinse BID  . citalopram  20 mg Per Tube Daily  . enoxaparin (LOVENOX) injection  40 mg Subcutaneous Q24H  . etomidate  40 mg Intravenous Once  . fentaNYL (SUBLIMAZE) injection  200 mcg Intravenous Once  . free water  100 mL Per Tube Q8H  . furosemide  40 mg Intravenous Q12H  .  insulin aspart  0-20 Units Subcutaneous Q4H  . insulin aspart  4 Units Subcutaneous Q4H  . ipratropium-albuterol  3 mL Nebulization TID  . mouth rinse  15 mL Mouth Rinse 10 times per day  . metoprolol tartrate  5 mg Intravenous Q6H  . midazolam  4 mg Intravenous Once  . pantoprazole sodium  40 mg Per Tube BID  . potassium chloride  40 mEq Per Tube BID  . QUEtiapine  25 mg Per Tube Daily  . QUEtiapine  50 mg Oral QHS  . sodium chloride flush  10-40 mL Intracatheter Q12H  . vecuronium  10 mg Intravenous Once   Continuous Infusions: . sodium chloride Stopped (02/15/18 0951)  . sodium chloride Stopped (02/16/18 1811)  . ampicillin-sulbactam (UNASYN) IV Stopped (02/17/18 1214)  . anidulafungin Stopped (02/17/18 1144)  . dextrose 10 mL/hr at 02/17/18 1306  . feeding supplement (VITAL AF 1.2 CAL) Stopped (02/16/18 2100)  . propofol    . vancomycin Stopped (02/17/18 1245)   PRN Meds:.sodium chloride, sodium chloride, acetaminophen (TYLENOL) oral liquid 160 mg/5 mL, fentaNYL (SUBLIMAZE) injection, hydrALAZINE, iopamidol, labetalol, sodium chloride flush  Review of Systems: Review of Systems  Unable to perform ROS: Intubated    Allergies  Allergen Reactions  . Oxycodone     confusion    OBJECTIVE: Vitals:   02/17/18 1116 02/17/18 1145 02/17/18 1200 02/17/18 1220  BP: Marland Kitchen)  121/45 (!) 105/36 (!) 104/38 (!) 130/54  Pulse: (!) 110 (!) 110 (!) 107 (!) 109  Resp: (!) _0 Temp: 100 F (37.8 C) 100.2 F (37.9 C) 100 F (37.8 C) 100 F (37.8 C)  TempSrc:      SpO2: 100% 99% 100% 100%  Weight:      Height:       Body mass index is 22.96 kg/m.  Physical Exam  Constitutional:  She remains intubated.  Cardiovascular: Normal rate, regular rhythm and normal heart sounds.  No murmur heard. Pulmonary/Chest: She has no wheezes. She has no rales.  She has coarse rhonchi anteriorly.    Abdominal: Soft. She exhibits no distension.  No more purulent drainage from her  abdominal wound. She had 10 cc of fluid out of her drain yesterday and another 10 cc this morning.  She has a small amount of clear yellow drainage in the drain bulb now.  She is having liquid stool.  Skin: No rash noted.  Left arm PICC site looks good.    Lab Results Lab Results  Component Value Date   WBC 9.2 02/17/2018   HGB 8.6 (L) 02/17/2018   HCT 27.3 (L) 02/17/2018   MCV 81.7 02/17/2018   PLT 313 02/17/2018    Lab Results  Component Value Date   CREATININE 0.48 02/17/2018   BUN 14 02/17/2018   NA 143 02/17/2018   K 2.6 (LL) 02/17/2018   CL 87 (L) 02/17/2018   CO2 45 (H) 02/17/2018    Lab Results  Component Value Date   ALT 69 (H) 02/12/2018   AST 60 (H) 02/12/2018   ALKPHOS 214 (H) 02/12/2018   BILITOT 0.7 02/12/2018     Microbiology: Recent Results (from the past 240 hour(s))  Aerobic Culture (superficial specimen)     Status: None   Collection Time: 02/10/18 10:56 AM  Result Value Ref Range Status   Specimen Description   Final    ABDOMEN Performed at [facility], [place] [address], [place], [place] [address]    Special Requests   Final    NONE Performed at [facility], [place] [address] [place], [place] [address]    Gram Stain   Final    RARE WBC PRESENT, PREDOMINANTLY PMN NO ORGANISMS SEEN    Culture   Final    FEW LACTOBACILLUS SPECIES Standardized susceptibility testing for this organism is not available. Performed at [facility], [place] [address], [place], [place] [address]    Report Status 02/14/2018 FINAL  Final  Aerobic/Anaerobic Culture (surgical/deep wound)     Status: None (Preliminary result)   Collection Time: 02/13/18 11:44 AM  Result Value Ref Range Status   Specimen Description JP DRAINAGE  Final   Special Requests NONE  Final   Gram Stain   Final    FEW WBC PRESENT, PREDOMINANTLY PMN MODERATE GRAM NEGATIVE RODS FEW GRAM POSITIVE RODS    Culture   Final    RARE ESCHERICHIA  COLI CULTURE REINCUBATED FOR BETTER GROWTH HOLDING FOR POSSIBLE ANAEROBE Performed at [facility], [place] [address], [place], [place] [address]    Report Status PENDING  Incomplete   Organism ID, Bacteria ESCHERICHIA COLI  Final      Susceptibility   Escherichia coli - MIC*    AMPICILLIN 4 SENSITIVE Sensitive     CEFAZOLIN <=4 SENSITIVE Sensitive     CEFEPIME <=1 SENSITIVE Sensitive     CEFTAZIDIME <=1 SENSITIVE  Sensitive     CEFTRIAXONE <=1 SENSITIVE Sensitive     CIPROFLOXACIN <=0.25 SENSITIVE Sensitive     GENTAMICIN <=1 SENSITIVE Sensitive     IMIPENEM <=0.25 SENSITIVE Sensitive     TRIMETH/SULFA <=20 SENSITIVE Sensitive     AMPICILLIN/SULBACTAM <=2 SENSITIVE Sensitive     PIP/TAZO <=4 SENSITIVE Sensitive     Extended ESBL NEGATIVE Sensitive     * RARE ESCHERICHIA COLI    Michel Bickers, [facility] [place] 336 [phone] pager   [phone] cell 02/17/2018, 1:15 PM

## 2018-02-17 NOTE — Progress Notes (Signed)
ATTESTATION & SIGNATURE   STAFF NOTE: I, Dr Ann Lions have personally reviewed patient's available data, including medical history, events of note, physical examination and test results as part of my evaluation. I have discussed with resident/NP and other care providers such as pharmacist, RN and RRT.  In addition,  I personally evaluated patient and elicited key findings of   S:los 21 days. Met with son at beside -who already d.w APP earlier - committed to trach and course of chronic critical illness and opportunity to giver her a chance to go from ECOG 4 -> 2 to be chemo eligible. He is aware of risks, benefits, and limitations of trach. ID managing her antibiotics. TF on hold. Dr Nelda Marseille plans for PDT at bedside later 02/17/2018   No issues overnight  No sedation gtt   O: MAP 77, On vent, frail, cachectic, Alert, Sons at bedside, Vitals all stable Follows commands as needed Sync with vent CTA   Recent Labs  Lab 02/15/18 0554 02/16/18 0455 02/17/18 0500  HGB 8.1* 8.0* 8.6*  HCT 26.3* 25.6* 27.3*  WBC 8.0 6.7 9.2  PLT 264 275 313   Recent Labs  Lab 02/12/18 0629 02/14/18 0437 02/15/18 0037 02/15/18 0554 02/16/18 0455 02/17/18 0500 02/17/18 0838  NA 144  --  144  --   --   --  143  K 3.3*  --  2.7*  --   --   --  2.6*  CL 91*  --  86*  --   --   --  87*  CO2 41*  --  43*  --   --   --  45*  GLUCOSE 142*  --  99  --   --   --  133*  BUN 30*  --  18  --   --   --  14  CREATININE 0.64 0.53 0.64  --   --   --  0.48  CALCIUM 8.4*  --  8.3*  --   --   --  8.2*  MG  --   --   --  1.3* 1.9 1.7  --   PHOS  --   --   --  4.3 2.2* 2.7  --    Recent Labs  Lab 02/17/18 0500 02/17/18 0838  INR 1.28 1.28   No results for input(s): LATICACIDVEN, PROCALCITON in the last 168 hours.  Dg Abd 1 View  Result Date: 02/17/2018 CLINICAL DATA:  Nasal/orogastric tube placement. EXAM: ABDOMEN - 1 VIEW COMPARISON:  Chest radiograph, 02/14/2018. FINDINGS: Nasal/orogastric tube extends  below the hemidiaphragms projecting in the mid stomach. Endotracheal tube, left PICC and right anterior chest wall Port-A-Cath are stable and well positioned. Irregularly thickened bronchovascular markings and hazy airspace opacity in the right mid lung is without significant change from the most recent prior study allowing for differences in patient positioning. Bilateral IMPRESSION: 1. Nasal/orogastric tube tip projects in the mid stomach, well positioned. Electronically Signed   By: Lajean Manes M.D.   On: 02/17/2018 10:45     A: acute on chronic resp failure - for PDT today Anemia of critical illness   P: PDT by Dr Nelda Marseille - PRBC for hgb </= 6.9gm%    - exceptions are   -  if ACS susepcted/confirmed then transfuse for hgb </= 8.0gm%,  or    -  active bleeding with hemodynamic instability, then transfuse regardless of hemoglobin value   At at all times try to transfuse 1 unit prbc as  possible with exception of active hemorrhage  - abx per ID      Rest per NP/medical resident whose note is outlined above and that I agree with    Dr. Brand Males, M.D., [facility].C.P Pulmonary and Critical Care Medicine Staff Physician [facility] Pager: [phone], If no answer or between  15:00h - 7:00h: call [phone]  02/17/2018 1:01 PM

## 2018-02-18 ENCOUNTER — Inpatient Hospital Stay (HOSPITAL_COMMUNITY): Payer: Medicare PPO

## 2018-02-18 ENCOUNTER — Encounter: Payer: Self-pay | Admitting: Oncology

## 2018-02-18 DIAGNOSIS — N739 Female pelvic inflammatory disease, unspecified: Secondary | ICD-10-CM

## 2018-02-18 DIAGNOSIS — R197 Diarrhea, unspecified: Secondary | ICD-10-CM

## 2018-02-18 DIAGNOSIS — I959 Hypotension, unspecified: Secondary | ICD-10-CM

## 2018-02-18 LAB — AEROBIC/ANAEROBIC CULTURE (SURGICAL/DEEP WOUND)

## 2018-02-18 LAB — CBC WITH DIFFERENTIAL/PLATELET
Basophils Absolute: 0 10*3/uL (ref 0.0–0.1)
Basophils Relative: 0 %
EOS PCT: 4 %
Eosinophils Absolute: 0.3 10*3/uL (ref 0.0–0.7)
HEMATOCRIT: 23.7 % — AB (ref 36.0–46.0)
Hemoglobin: 7.4 g/dL — ABNORMAL LOW (ref 12.0–15.0)
LYMPHS ABS: 1.4 10*3/uL (ref 0.7–4.0)
Lymphocytes Relative: 15 %
MCH: 25.3 pg — AB (ref 26.0–34.0)
MCHC: 31.2 g/dL (ref 30.0–36.0)
MCV: 81.2 fL (ref 78.0–100.0)
MONO ABS: 0.8 10*3/uL (ref 0.1–1.0)
MONOS PCT: 9 %
Neutro Abs: 6.8 10*3/uL (ref 1.7–7.7)
Neutrophils Relative %: 72 %
Platelets: 302 10*3/uL (ref 150–400)
RBC: 2.92 MIL/uL — ABNORMAL LOW (ref 3.87–5.11)
RDW: 24.1 % — AB (ref 11.5–15.5)
WBC: 9.4 10*3/uL (ref 4.0–10.5)

## 2018-02-18 LAB — AEROBIC/ANAEROBIC CULTURE W GRAM STAIN (SURGICAL/DEEP WOUND)

## 2018-02-18 LAB — GLUCOSE, CAPILLARY
GLUCOSE-CAPILLARY: 120 mg/dL — AB (ref 70–99)
GLUCOSE-CAPILLARY: 134 mg/dL — AB (ref 70–99)
GLUCOSE-CAPILLARY: 142 mg/dL — AB (ref 70–99)
GLUCOSE-CAPILLARY: 149 mg/dL — AB (ref 70–99)
GLUCOSE-CAPILLARY: 166 mg/dL — AB (ref 70–99)
Glucose-Capillary: 105 mg/dL — ABNORMAL HIGH (ref 70–99)

## 2018-02-18 LAB — URINE CULTURE: Culture: NO GROWTH

## 2018-02-18 LAB — BASIC METABOLIC PANEL
Anion gap: 8 (ref 5–15)
BUN: 14 mg/dL (ref 8–23)
CHLORIDE: 91 mmol/L — AB (ref 98–111)
CO2: 42 mmol/L — AB (ref 22–32)
Calcium: 7.7 mg/dL — ABNORMAL LOW (ref 8.9–10.3)
Creatinine, Ser: 0.53 mg/dL (ref 0.44–1.00)
GFR calc Af Amer: >60 mL/min (ref >60)
GFR calc non Af Amer: >60 mL/min (ref >60)
Glucose, Bld: 226 mg/dL — ABNORMAL HIGH (ref 70–99)
POTASSIUM: 3 mmol/L — AB (ref 3.5–5.1)
SODIUM: 141 mmol/L (ref 135–145)

## 2018-02-18 LAB — PHOSPHORUS: Phosphorus: 2.2 mg/dL — ABNORMAL LOW (ref 2.5–4.6)

## 2018-02-18 LAB — MAGNESIUM: Magnesium: 1.8 mg/dL (ref 1.7–2.4)

## 2018-02-18 MED ORDER — SODIUM PHOSPHATES 45 MMOLE/15ML IV SOLN
10.0000 mmol | Freq: Once | INTRAVENOUS | Status: AC
  Administered 2018-02-18: 10 mmol via INTRAVENOUS
  Filled 2018-02-18: qty 3.33

## 2018-02-18 MED ORDER — LABETALOL HCL 5 MG/ML IV SOLN
10.0000 mg | INTRAVENOUS | Status: DC | PRN
  Administered 2018-02-22 – 2018-03-02 (×7): 10 mg via INTRAVENOUS
  Filled 2018-02-18 (×7): qty 4

## 2018-02-18 MED ORDER — LACTATED RINGERS IV BOLUS
500.0000 mL | Freq: Once | INTRAVENOUS | Status: AC
  Administered 2018-02-18: 500 mL via INTRAVENOUS

## 2018-02-18 MED ORDER — METOPROLOL TARTRATE 5 MG/5ML IV SOLN
2.5000 mg | INTRAVENOUS | Status: DC | PRN
  Administered 2018-02-19 – 2018-02-21 (×2): 5 mg via INTRAVENOUS
  Administered 2018-02-21: 2.5 mg via INTRAVENOUS
  Administered 2018-02-21: 5 mg via INTRAVENOUS
  Administered 2018-02-22: 2.5 mg via INTRAVENOUS
  Administered 2018-02-23: 5 mg via INTRAVENOUS
  Administered 2018-02-23: 2.5 mg via INTRAVENOUS
  Administered 2018-02-23: 5 mg via INTRAVENOUS
  Administered 2018-02-24: 2.5 mg via INTRAVENOUS
  Administered 2018-02-24: 5 mg via INTRAVENOUS
  Filled 2018-02-18 (×10): qty 5

## 2018-02-18 MED ORDER — MAGNESIUM SULFATE 2 GM/50ML IV SOLN
2.0000 g | Freq: Once | INTRAVENOUS | Status: AC
  Administered 2018-02-18: 2 g via INTRAVENOUS
  Filled 2018-02-18: qty 50

## 2018-02-18 MED ORDER — POTASSIUM CHLORIDE 20 MEQ/15ML (10%) PO SOLN
30.0000 meq | ORAL | Status: AC
  Administered 2018-02-18 (×2): 30 meq
  Filled 2018-02-18 (×2): qty 30

## 2018-02-18 NOTE — Care Management (Addendum)
[id number] the chart patient was trached on [id number], tolerated well/will ask MD about ltach for this patient. Have spoken with md, and family concerning ltach placement/ family -the son-[facility], representative [facility] notified and she will follow up. Suanne Marker Gevorg Brum,BSN,RN3,CCM [phone].

## 2018-02-18 NOTE — Progress Notes (Signed)
Nutrition Follow-up  DOCUMENTATION CODES:   Not applicable  INTERVENTION:  - Continue Vital AF 1.2 @ 50 mL/hr with 100 mL free water TID.  - Monitor magnesium, potassium, and phosphorus daily for at least 3 days, MD to replete as needed.   NUTRITION DIAGNOSIS:   Increased nutrient needs related to post-op healing as evidenced by estimated needs. -ongoing  GOAL:   Patient will meet greater than or equal to 90% of their needs -met with TF regimen  MONITOR:   Vent status, TF tolerance, Weight trends, Labs, Skin  ASSESSMENT:   Patient with PMH significant for COPD< HLD, HTN, mitral valve prolapse, and Stage IV ovarian cancer s/p neoadjuvant chemotherapy . Presents this admission for robotic assisted bilateral salpingo-oophorectomy omentectomy interval debulking.   Significant Events: 8/22:ex lap with infra-gastric omentectomy bilateral salpingo-oophorectomy radical optimal tumor debulking, resection of anterior abdominal wall 8/24:remained somnolent and was confused, noted to have post-op UTI 8/26:remained confused and having chest pain; transferred to SDU for Cardene drip; x-ray showed free air and CT abd concerning for ischemic bowel, bowel pneumatosis, and large amount of pneumoperitoneum; repeat ex lap with washout. 8/27:triple lumen PICC placed. 8/28:TPN initiated; ILE hold x7 days; received Granix per Oncology. 9/4:trickle TF started via NGT. 9/5:TPN weaned off. 9/6:TF rate advanced to 50 mL/hr (goal rate). 9/9: TF placed on hold d/t concern for microperf 9/10: TF restarted at 50 mL/hr.  9/12: tracheostomy placed.   Weight beginning to trend back up. Patient now on vent via trach and continues with NGT in place. She is receiving Vital AF 1.2 @ 50 mL/hr with 100 mL free water TID. This regimen is providing 1440 kcal (97% estimated kcal need), 90 grams of protein, and 1273 mL free water. Noted that K and Phos have been low but unsure that this is related to refeeding  as patient has been receiving dextrose via nutrition support since 8/28. Will continue to monitor closely.    Patient is currently intubated on ventilator support MV: 8.2 L/min Temp (24hrs), Avg:100.5 F (38.1 C), Min:99 F (37.2 C), Max:101.1 F (38.4 C) BP:133/47 and MAP: 74   Medications reviewed; 40 mg IV Lasix BID, sliding scale Novolog, 4 units Novolog every 4 hours, 2 g IV Mg sulfate x1 run yesterday and x1 run today, 40 mg Protonix per NGT BID, 30 mEq KCl per NGT x2 doses today, 40 mEq KCl per NGT x2 doses yesterday, 10 mmol IV NaPhos x1 run today.  Labs reviewed; CBGs: 166 and 134 mg/dL, K: 3 mmol/L, Cl: 91 mmol/L, Phos: 2.2 mg/dL, Ca: 7.7 mg/dL. IVF; D5 @ 10 mL/hr (41 kcal).   Diet Order:   Diet Order            Diet NPO time specified  Diet effective midnight              EDUCATION NEEDS:   Education needs have been addressed  Skin:  Skin Assessment: Skin Integrity Issues: Skin Integrity Issues:: Unstageable Unstageable: full thickness to R ear Incisions: abdominal (8/26)  Last BM:  9/13  Height:   Ht Readings from Last 1 Encounters:  01/31/18 '5\' 3"'$  (1.6 m)    Weight:   Wt Readings from Last 1 Encounters:  02/18/18 62.1 kg    Ideal Body Weight:  52.3 kg  BMI:  Body mass index is 24.25 kg/m.  Estimated Nutritional Needs:   Kcal:  4196  Protein:  87-99 grams (1.5-1.7 grams/kg)  Fluid:  >/= 1.5 L/day     Janett Billow  Ceasar Mons, MS, RD, LDN, CNSC Inpatient Clinical Dietitian Pager # [phone] After hours/weekend pager # [phone]

## 2018-02-18 NOTE — Progress Notes (Signed)
Earlier today d/w [name] NP GYN - she confirmed GYN is primary service

## 2018-02-18 NOTE — Progress Notes (Signed)
Pharmacy Antibiotic Note  Jo Parker is a 72 y.o. female admitted on 01/27/2018 with hx of ovarian cancer for bilateral salpingo-oophorectomy and omentectomy with debulking on 8/22.  She now has developed suspected intra-abdominal infection.  CT abdomen results worrisome for ischemic bowel with portal venous gas, bowel pneumatosis, and large amount of pneumoperitoneum without clear source of perforation.  Pharmacy has been consulted for meropenem dosing on 8/26 and added vancomycin on 8/27 for pneumonia. Vancomycin d/c'ed 8/30, but resumed 9/1 for new fevers. Anidulafungin added for empiric coverage given concern for intra-abdominal infection, ongoing fever. Meropenem>>Unasyn 9/11.   Today, 02/18/18:  Total abx Day #19/28  ID plans on 28 days of total abx tx  D#2 Unasyn  D#19 anidulafungin  D#13 vanc  Tmax 101.1, WBC WNL, back on levophed for BP support 9/8 JP drain - Rare Ecoli - pansensitive, moderate lactobacillus species (not sent testing) F  Plan per ID:  Continue Unasyn 3 gm IV q6h  Continue Anidulafungin 100mg  IV q24h (day #16)  Continue Vancomycin 750 mg IV q24  follow up renal fxn, culture results, clinical course, ID recommendations  Vancomycin levels as needed, at least weekly  Height: 5\' 3"  (160 cm) Weight: 136 lb 14.5 oz (62.1 kg) IBW/kg (Calculated) : 52.4  Temp (24hrs), Avg:100.5 F (38.1 C), Min:99.7 F (37.6 C), Max:101.1 F (38.4 C)  Recent Labs  Lab 02/12/18 0629  02/14/18 0437 02/15/18 0037 02/15/18 0554 02/16/18 0455 02/16/18 1122 02/16/18 1401 02/17/18 0500 02/17/18 0838 02/18/18 0414  WBC 9.3   < > 10.0  --  8.0 6.7  --   --  9.2  --  9.4  CREATININE 0.64  --  0.53 0.64  --   --   --   --   --  0.48 0.53  VANCOTROUGH  --   --   --   --   --   --  12*  --   --   --   --   VANCOPEAK  --   --   --   --   --   --   --  49*  --   --   --    < > = values in this interval not displayed.    Estimated Creatinine Clearance: 52.6 mL/min (by  C-G formula based on SCr of 0.53 mg/dL).    Allergies  Allergen Reactions  . Oxycodone     confusion   Antimicrobials this admission: 8/26 Meropenem >> 9/11 8/27 Vancomycin >>8/30, resume 9/1 >>  8/27 Anidulafungin >>  9/11 unasyn >>  Dose adjustments this admission: 9/4 Vanc peak @ 1440 = 36 mcg/mL, 9/5 Vanc trough @ 1200 = 9 mcg/mL --> AUC 485, no change 9/11 Vanc dose at 1200, Vanc trough at 1122 = 12, Vanc trough at 1400 = 49 - calculated AUC of 652 using 1g q24 - would decrease to 750mg  q24 for AUC of 489 (Pk 39 and Tr 8.6)  Microbiology results: 8/24 UCx: NGF 8/25 MRSA PCR: negative 8/26 BCx: NGF 8/27 Trach asp: few candida albicans 8/31 Intraperitoneal drain#3: NGF 9/1 BCx (x1): NGF 9/1 Trach asp: few candida albicans, few candida glabrata  9/1 UCx: NGF 9/5 abdomen: culture reincubated for better growth - few lactobacillus 9/8 JP drain - Rare Ecoli - pansensitive, moderate lactobacillus species (not sent testing) F 9/12 C diff neg 9/12 BCx2>>ngtd 9/12 Ucx>>sent 9/12 sputum>>ordered  Thank you for allowing pharmacy to be a part of this patient's care.  Eudelia Bunch, Pharm.D [phone]  02/18/2018 11:56 AM

## 2018-02-18 NOTE — Progress Notes (Addendum)
Gynecologic Oncology Progress Note  72 year old female diagnosed with Stage IV ovarian cancer who received four cycles of neoadjuvant chemotherapy with carboplatin and taxol with fourth cycle completed on 12/20/17 .  She underwent interval debulking surgery with Dr. Skeet Latch on 01/27/18 consisting of an exploratory laparotomy infra-gastric omentectomy bilateral salpingo-oophorectomy radical optimal tumor debulking, resection of anterior abdominal wall.  Her post-operative course was complicated by altered mental status noted on POD1 with improvement after Narcan administration but with return of symptoms, acute renal injury, tachycardia, dyspnea (CT chest performed 01/30/18-neg for PE), anemia.  Her AKI and altered mental status appeared to improve on 01/30/18 in the am but symptoms worsened the evening of 01/30/18 with episodes of emesis, tachycardia, hypertension, and confusion. A CT scan on 01/31/18 revealed findings most consistent with ischemic bowel with portal venous gas and bowel pneumatosis and large amount of pneumoperitoneum with concerns for peritonitis.  She was taken back to the OR on 01/31/18 with operative findings of free air and large volume ascites/fluid, but unable to visualize site of bowel perforation, no evidence of ischemia. It was felt that aspiration most likely occurred during intubation.        Subjective: Patient asleep but opening eyes when performing dressing change. [name] stating she was "mad" at him about the trach being placed. States his mother motioned to him that she was doing good this am with a thumbs up. No other concerns voiced.    Objective: Vital signs in last 24 hours: Temp:  [100 F (37.8 C)-101.1 F (38.4 C)] 100.2 F (37.9 C) (09/13 1330) Pulse Rate:  [88-122] 100 (09/13 1330) Resp:  [15-35] 24 (09/13 1330) BP: (62-167)/(20-66) 114/43 (09/13 1330) SpO2:  [98 %-100 %] 100 % (09/13 1330) FiO2 (%):  [30 %] 30 % (09/13 1416) Weight:  [136 lb 14.5  oz (62.1 kg)] 136 lb 14.5 oz (62.1 kg) (09/13 0436) Last BM Date: 02/18/18  Intake/Output from previous day: 09/12 0701 - 09/13 0700 In: 894.3 [I.V.:98.1; NG/GT:275; IV Piggyback:521.2] Out: 1885 [Urine:1500; Drains:15; Stool:370]  Physical Examination: General: asleep, appears comfortable Cardio: Mildly tachycardic at 100 bpm. Resp: Coarse breath sounds but clear.  Trach in place GI: Abdomen minimally distended.  Flexiseal in place with liquid brown stool- c diff neg.  Dressing changed without difficulty.  Granulation tissue noted on the fascia.  Packing with saline dampened kerliz.  LLQ JP stripped with fibrinous tissue present. Fluid in JP remains serous.  There is no leaking around the tubing entry site. Prior JP sites are healing with no drainage or erythema. Upper aspect of the incision with steri strips in place. Extremities: generalized edema.  Port a cath dressing intact. Significant temporal wasting noted.   Labs: WBC/Hgb/Hct/Plts:  9.4/7.4/23.7/302 (09/13 6578) BUN/Cr/glu/ALT/AST/amyl/lip:  14/0.53/--/--/--/--/-- (09/13 4696)  Assessment: 72 y.o. s/p Procedure(s): Interval debulking surgery with Dr. Skeet Latch on 01/27/18 consisting of an exploratory laparotomy infra-gastric omentectomy bilateral salpingo-oophorectomy radical optimal tumor debulking, resection of anterior abdominal wall.  Reoperation of EXPLORATORY LAPAROTOMY, ABDOMINAL WASHOUT on 01/31/18:   Heme:  Post-operative anemia: Hgb 7.4 and Hct 23.7 this am. Last transfusion 02/02/18.  Anemia related to acute critical illness, no evidence of active bleeding.   ID: Fever as high as 101.1 on 02/18/18. Urine for culture with no growth and sputum culture ordered. Stool for c diff negative. Blood cultures drawn 02/17/18 with no growth <24 hrs.  Presumed sepsis initially from gastrointestinal source but unable to ID the perforation after exp lap on 01/31/18. Contribution  now from pneumonia and possibly undrained left pelvic fluid  collection seen on CT scan 02/07/18; however the pelvic collection is improving. 02/13/18 enteric versus old hematoma appearing contents in JP. Cultures showing FEW WBC PRESENT, PREDOMINANTLY PMN MODERATE GRAM NEGATIVE RODS FEW GRAM POSITIVE RODS. CT from Friday did not show a leak.   Repeat CT 02/14/18: Similar to slight decrease in size of ill-defined complex fluid and gas in the lateral left hemipelvis. 2. No evidence of contrast communication between the thickened sigmoid and this pelvic sidewall process. 3. Foley catheter and air within the urinary bladder, which is not decompressed. Correlate with catheter function.   WBC 9.4 this am. Granix received 02/01/18 per Dr. Alvy Bimler.  Vancomycin stopped on 02/04/18 and then fevers began again. Restarted Vancomycin on 02/06/18. Anidulafungin initiated 02/01/18.  Meropenum IV started 01/31/18 and discontinued on 02/16/18.  Unasyn started 02/16/18.  Blood cultures from 01/31/18 & 02/06/18 &  02/17/18 with no growth. Tracheal aspirate from 02/01/18 resulting few gram positive rods. Repeated 02/06/18 showed few yeast.  IR feels no safe window for drainage of pelvic fluid collection.  Repeat imaging showed improvement. Appreciate ID input  CV: Tachycardia manager per CC. BP sensitive with changes in sedation. Telemetry, diuresis per CC, lopressor, and labetalol ordered PRN.  Resp: ARDS in setting of aspiration pneumonia and sepsis.   Chest 1 view 9/13: IMPRESSION: Stable appearing bilateral infiltrates similar to that seen on the prior exam. Tubes and lines as described.  - Tracheostomy placed 02/17/18.  GI: Tube feedings resumed.  C diff negative. -CT AP on 01/31/18 with findings consistent with GI perforation.  No obvious perforation identified on re-operation on 01/31/18. GI consultation appreciated.  -TPN initiated 02/02/18 and discontinued 02/10/18. Tube feedings running continuously -Loose bowel movements in Flexiseal -Repeat CT 9/6 showed no worsening (in fact decrease  size) of pelvic collection. No leak.  -02/13/18 -Enteric versus old hematoma contents in LLQ JP. CT with rectal contrast and oral contrast this am. -CT pelvis 02/14/18: Stable to slight decrease in pelvic fluid collection, no evidence of contrast communication. -JP fluid culture from 02/13/18:  FEW WBC PRESENT, PREDOMINANTLY PMN  MODERATE GRAM NEGATIVE RODS FEW GRAM POSITIVE RODS  GU: AKI post-op resolved.  Foley in place with yellow urine.  Diuresis per CC. Creatinine 0.53 this am  FEN:  IVF per CC.  Replacement per CC- K+ 3.0, Phos 2.2, Mag 1.8 this am  Prophylaxis: SCDs in place. Continue Lovenox prophylaxis. No active bleeding and H&H stable.  Plan: Continue care per Critical Care, ID, and Dr. Denman George. LTAC consult placed per CC. Case Manager to present options to family.   Continue wet to dry dressing changes twice daily.     Melissa D Cross 02/18/2018, 2:32 PM

## 2018-02-18 NOTE — Progress Notes (Addendum)
Patient ID: Jo Parker, female   DOB: 02/14/1946, 72 y.o.   MRN: [id number]         Jo Parker for Infectious Disease  Date of Admission:  01/27/2018           Day 19 vancomycin        Day 19 anidulafungin        Day 2 ampicillin sulbactam ASSESSMENT: She continues to be febrile and is now hypotensive.  Her C. difficile screen was negative and blood cultures are negative at 24 hours.  I will continue her current, broad antibiotic therapy for her on drainable, postop pelvic abscess and pneumonia.  Assuming that her family wants to continue full support I plan on 28 days of total antibiotic therapy.  PLAN: 1. Continue current antimicrobial therapy 2. Await results of blood, urine and sputum cultures 3. Please call Dr. Dietrich [place] [phone]) for any infectious disease questions this weekend  Principal Problem:   Pelvic abscess in female Active Problems:   Severe sepsis ([place])   Peritonitis ([place])   HCAP (healthcare-associated pneumonia)   Postoperative wound infection   Primary peritoneal carcinomatosis ([place])   Ovarian cancer ([place])   Status post bilateral salpingo-oophorectomy   Severe protein-calorie malnutrition ([place])   COPD (chronic obstructive pulmonary disease) ([place])   HLD (hyperlipidemia)   Absolute anemia   White coat syndrome with hypertension   AKI (acute kidney injury) ([place])   Acute metabolic encephalopathy   Acute respiratory distress   Low urine output   Somnolence   At risk for fluid volume overload   Free intraperitoneal air   Acute respiratory failure ([name])   Pressure injury of skin   Aspiration pneumonia ([name])   Scheduled Meds: . chlorhexidine gluconate (MEDLINE KIT)  15 mL Mouth Rinse BID  . citalopram  20 mg Per Tube Daily  . enoxaparin (LOVENOX) injection  40 mg Subcutaneous Q24H  . free water  100 mL Per Tube Q8H  . furosemide  40 mg Intravenous Q12H  . insulin aspart  0-20 Units Subcutaneous Q4H  . insulin aspart  4 Units  Subcutaneous Q4H  . ipratropium-albuterol  3 mL Nebulization TID  . mouth rinse  15 mL Mouth Rinse 10 times per day  . metoprolol tartrate  5 mg Intravenous Q6H  . pantoprazole sodium  40 mg Per Tube BID  . QUEtiapine  25 mg Per Tube Daily  . QUEtiapine  50 mg Oral QHS  . sodium chloride flush  10-40 mL Intracatheter Q12H   Continuous Infusions: . sodium chloride Stopped (02/15/18 0951)  . sodium chloride Stopped (02/16/18 1811)  . ampicillin-sulbactam (UNASYN) IV Stopped (02/18/18 0554)  . anidulafungin Stopped (02/18/18 1137)  . dextrose Stopped (02/18/18 0951)  . feeding supplement (VITAL AF 1.2 CAL) 1,000 mL (02/17/18 1630)  . norepinephrine (LEVOPHED) Adult infusion 1 mcg/min (02/18/18 0950)  . sodium phosphate  Dextrose 5% IVPB 42 mL/hr at 02/18/18 1100  . vancomycin 750 mg (02/18/18 1141)   PRN Meds:.sodium chloride, sodium chloride, acetaminophen (TYLENOL) oral liquid 160 mg/5 mL, fentaNYL (SUBLIMAZE) injection, hydrALAZINE, iopamidol, labetalol, sodium chloride flush  Review of Systems: Review of Systems  Unable to perform ROS: Intubated    Allergies  Allergen Reactions  . Oxycodone     confusion    OBJECTIVE: Vitals:   02/18/18 1030 02/18/18 1045 02/18/18 1100 02/18/18 1115  BP: (!) 114/43 (!) 115/36 (!) 114/44 (!) 114/46  Pulse: (!) 110 (!) 108 (!) 111 (!) 110  Resp: (!) 27 (!)  35 (!) 30 (!) 30  Temp: (!) 100.4 F (38 C) (!) 100.4 F (38 C) (!) 100.4 F (38 C) (!) 100.4 F (38 C)  TempSrc:      SpO2: 100% 100% 100% 100%  Weight:      Height:       Body mass index is 24.25 kg/m.  Physical Exam  Constitutional:  She remains intubated.  She is alert.  She was started on pressors overnight for hypotension.  Cardiovascular: Normal rate, regular rhythm and normal heart sounds.  No murmur heard. Pulmonary/Chest: She has no wheezes. She has no rales.  She has rhonchi.    Abdominal: Soft. She exhibits no distension.  No more purulent drainage from her  abdominal wound according to her nurse. She had 15 cc of fluid out of her drain yesterday.  She has a small amount of clear yellow drainage in the drain bulb now.  She continues to have liquid stool.  Skin: No rash noted.  Left arm PICC site looks good.    Lab Results Lab Results  Component Value Date   WBC 9.4 02/18/2018   HGB 7.4 (L) 02/18/2018   HCT 23.7 (L) 02/18/2018   MCV 81.2 02/18/2018   PLT 302 02/18/2018    Lab Results  Component Value Date   CREATININE 0.53 02/18/2018   BUN 14 02/18/2018   NA 141 02/18/2018   K 3.0 (L) 02/18/2018   CL 91 (L) 02/18/2018   CO2 42 (H) 02/18/2018    Lab Results  Component Value Date   ALT 69 (H) 02/12/2018   AST 60 (H) 02/12/2018   ALKPHOS 214 (H) 02/12/2018   BILITOT 0.7 02/12/2018     Microbiology: Recent Results (from the past 240 hour(s))  Aerobic Culture (superficial specimen)     Status: None   Collection Time: 02/10/18 10:56 AM  Result Value Ref Range Status   Specimen Description   Final    ABDOMEN Performed at [facility], [place] [address] [place], [place] [address]    Special Requests   Final    NONE Performed at [facility], [place] [address] [place], [place] [address]    Gram Stain   Final    RARE WBC PRESENT, PREDOMINANTLY PMN NO ORGANISMS SEEN    Culture   Final    FEW LACTOBACILLUS SPECIES Standardized susceptibility testing for this organism is not available. Performed at [facility], [place] [address] [place], [place] [address]    Report Status 02/14/2018 FINAL  Final  Aerobic/Anaerobic Culture (surgical/deep wound)     Status: None   Collection Time: 02/13/18 11:44 AM  Result Value Ref Range Status   Specimen Description JP DRAINAGE  Final   Special Requests NONE  Final   Gram Stain   Final    FEW WBC PRESENT, PREDOMINANTLY PMN MODERATE GRAM NEGATIVE RODS FEW GRAM POSITIVE RODS Performed at [facility], [place] [address], [place], [place]  [address]    Culture   Final    RARE ESCHERICHIA COLI MODERATE LACTOBACILLUS SPECIES Standardized susceptibility testing for this organism is not available. MIXED ANAEROBIC FLORA PRESENT.  CALL LAB IF FURTHER IID REQUIRED.    Report Status 02/18/2018 FINAL  Final   Organism ID, Bacteria ESCHERICHIA COLI  Final      Susceptibility   Escherichia coli - MIC*    AMPICILLIN 4 SENSITIVE Sensitive     CEFAZOLIN <=4 SENSITIVE Sensitive     CEFEPIME <=1 SENSITIVE  Sensitive     CEFTAZIDIME <=1 SENSITIVE Sensitive     CEFTRIAXONE <=1 SENSITIVE Sensitive     CIPROFLOXACIN <=0.25 SENSITIVE Sensitive     GENTAMICIN <=1 SENSITIVE Sensitive     IMIPENEM <=0.25 SENSITIVE Sensitive     TRIMETH/SULFA <=20 SENSITIVE Sensitive     AMPICILLIN/SULBACTAM <=2 SENSITIVE Sensitive     PIP/TAZO <=4 SENSITIVE Sensitive     Extended ESBL NEGATIVE Sensitive     * RARE ESCHERICHIA COLI  Culture, blood (routine x 2)     Status: None (Preliminary result)   Collection Time: 02/17/18  1:12 PM  Result Value Ref Range Status   Specimen Description   Final    BLOOD RIGHT ANTECUBITAL Performed at [facility] [address] [place], [place] [address]    Special Requests   Final    BOTTLES DRAWN AEROBIC ONLY Blood Culture adequate volume Performed at [facility] [address], [place], [place] [address]    Culture   Final    NO GROWTH < 24 HOURS Performed at [facility] [address] [place], [place] [address]    Report Status PENDING  Incomplete  Culture, blood (routine x 2)     Status: None (Preliminary result)   Collection Time: 02/17/18  1:14 PM  Result Value Ref Range Status   Specimen Description   Final    BLOOD RIGHT ANTECUBITAL Performed at [facility] [address], [place], [place] [address]    Special Requests   Final    BOTTLES DRAWN AEROBIC ONLY Blood Culture adequate volume Performed at [facility] [address], [place], [place] [address]    Culture   Final    NO GROWTH < 24 HOURS Performed at [facility] [address] [place], [place] [address]    Report Status PENDING  Incomplete  C difficile quick scan w PCR reflex     Status: None   Collection Time: 02/17/18  3:08 PM  Result Value Ref Range Status   C Diff antigen NEGATIVE NEGATIVE Final   C Diff toxin NEGATIVE NEGATIVE Final   C Diff interpretation No C. difficile detected.  Final    Comment: Performed at [facility], [place] [address], [place], [place] [address]    Michel Bickers, [place] for Infectious [facility] [phone] pager   [phone] cell 02/18/2018, 11:42 AM

## 2018-02-18 NOTE — Progress Notes (Signed)
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  She is completed 3 cycles of chemotherapy and is now status post bilateral salpingo-oophorectomy and omentectomy with debulking on 8/22.   8/26 transferred to ICU w/ concern for perforation w/ free air noted.  Imipenem started. Went for exploratory laparotomy with washout.  No significant contamination identified, presumed spontaneously closed microperf. Returned with abdominal drains postoperative chest x-ray showing evidence of aspiration pneumonia; mixed respiratory and metabolic acidosis & in septic shock. Required intubation.   8/27 GI medicine consulted, did not feel she had overt GIB.  Rec's prophylaxis with PPI.  Signed off.  Weaning pressors. Hgb dropped from 8.8 to 7.4; got 2 units of blood,  febrile, added. vancomycin.  Neupogen given for neutropenia.   8/28: Pressors off/shock resolved. NAG acidosis and Lactic acidosis resolved.  FiO2/PEEP down, attempted PSV.  WBC count up.  Started TPN.   8/29: FiO2 and PEEP requirements increased, had wean these down to 40/5 now 60/10.  Chest x-ray with worsening right-sided airspace disease likely reflecting de-recruitment. Delirium remains significant obstacle.  Beta-blocker, and clonidine patch added for hypertension   8/30: Diuresed 4.5 L the day prior with Lasix.  Little more awake.  X-ray improved.  Vancomycin discontinued.  Continuing diuresis, RASS goal changed to -1, dropping PEEP to 8.  9/01:  Vanco added back with fevers, clonidine patch  9/02-9/6: Delirium. Sedation difficult, diuresing, remains on abx. spiking temps, loculated fluid collection identified in left pelvis. Not amedable to drainage.  ID consulted. Started seroquel. 9/4. Upper 2 drains  removed. Staples removed.from incision site.  9/4: Infectious disease.  Consulted by family.  Had no additional rec's  9/6: WBC curve normalized but still having low gd fever and purulent drainage from wound. CT shows abd fluid collection better  9/7-9/8: Failing pressure support weans.  9/9 febrile 101F. WBC ok. Not on pressors. Not on sedation gtt. Husband deferring decisions about care to son (newly married). Suspicion for per > CT ABD with decrease in fluid collection   9/10  Sister at bedside - says she is not in favor of trach but says is the decision of son/dpoa. Son met with Dr Alvy Bimler for goals of care - current status precludes chemo but goal is trach and full code and full medical care and later chemo when ECOG improves.  Low grade fever persists - ID following. More awake today. No Perf leak on CT abd and restarted TF  9/11 Ongoing fever.  Failed SBT.  Pending trach 9/12  9/12- s/p Trach - Dr Nelda Marseille  Subjective / interval events   9/13 - s.p trach. Running fevers 101.F. K, Mag and phos - repleted. BAck on levophed 24mg. Doing SBT  Objective    Blood pressure (!) 110/36, pulse (!) 114, temperature (!) 101.1 F (38.4 C), resp. rate (!) 28, height 5' 3" (1.6 m), weight 62.1 kg, SpO2 100 %.    Vent Mode: PSV;CPAP FiO2 (%):  [30 %] 30 % Set Rate:  [20 bmp] 20 bmp Vt Set:  [330 mL] 330 mL PEEP:  [5 cmH20] 5 cmH20 Pressure Support:  [5 cmH20] 5 cmH20 Plateau Pressure:  [10 cmH20-19 cmH20] 13 cmH20   Intake/Output Summary (Last 24 hours) at 02/18/2018 0934 Last data filed at 02/18/2018 0900  Gross per 24 hour  Intake 1210.38 ml  Output 2035 ml  Net -824.62 ml   Filed Weights   02/16/18 1111 02/17/18 0500 02/18/18 0436  Weight: 57 kg 58.8 kg 62.1 kg     EXAM  General Appearance:  Frail, deconditioned, alert, s/p trac Head:  Normocephalic, without obvious abnormality, atraumatic Eyes:  PERRL - yes, conjunctiva/corneas - clear     Ears:  Normal external ear canals,  both ears Nose:  G tube - has NG tube Throat:  ETT TUBE - no , OG tube - no, HAS TRACH - site looks clean Neck:  Supple,  No enlargement/tenderness/nodules Lungs: Clear to auscultation bilaterally, Ventilator   Synchrony - yes Heart:  S1 and S2 normal, no murmur, CVP - no.  Pressors - levophed 22mg Abdomen:  Soft, no masses, no organomegaly Genitalia / Rectal:  Not done Extremities:  Extremities-  intact Skin:  ntact in exposed areas . Neurologic:  Sedation - none -> RASS -+1 . Moves all 4s - yes. CAM-ICU - neg . Orientation - x3+0   Labs    PULMONARY No results for input(s): PHART, PCO2ART, PO2ART, HCO3, TCO2, O2SAT in the last 168 hours.  Invalid input(s): PCO2, PO2  CBC Recent Labs  Lab 02/16/18 0455 02/17/18 0500 02/18/18 0414  HGB 8.0* 8.6* 7.4*  HCT 25.6* 27.3* 23.7*  WBC 6.7 9.2 9.4  PLT 275 313 302    COAGULATION Recent Labs  Lab 02/17/18 0500 02/17/18 0838  INR 1.28 1.28   CARDIAC   No results for input(s): TROPONINI in the last 168 hours. No results for input(s): PROBNP in the last 168 hours.  CHEMISTRY Recent Labs  Lab 02/12/18 0629 02/14/18 0437 02/15/18 0037 02/15/18 0554 02/16/18 0455 02/17/18 0500 02/17/18 0838 02/18/18 0414  NA 144  --  144  --   --   --  143 141  K 3.3*  --  2.7*  --   --   --  2.6* 3.0*  CL 91*  --  86*  --   --   --  87* 91*  CO2 41*  --  43*  --   --   --  45* 42*  GLUCOSE 142*  --  99  --   --   --  133* 226*  BUN 30*  --  18  --   --   --  14 14  CREATININE 0.64 0.53 0.64  --   --   --  0.48 0.53  CALCIUM 8.4*  --  8.3*  --   --   --  8.2* 7.7*  MG  --   --   --  1.3* 1.9 1.7  --  1.8  PHOS  --   --   --  4.3 2.2* 2.7  --  2.2*   Estimated Creatinine Clearance: 52.6 mL/min (by C-G formula based on SCr of 0.53 mg/dL).  LIVER Recent Labs  Lab 02/12/18 0629 02/17/18 0500 02/17/18 0838  AST 60*  --   --   ALT 69*  --   --   ALKPHOS 214*  --   --   BILITOT 0.7  --   --   PROT 6.8  --   --   ALBUMIN 1.6*   --   --   INR  --  1.28 1.28   INFECTIOUS No results for input(s): LATICACIDVEN, PROCALCITON in the last 168 hours.  ENDOCRINE CBG (last 3)  Recent Labs    02/17/18 2308 02/18/18  0346 02/18/18 0751  GLUCAP 168* 166* 134*   IMAGING  Dg Abd 1 View  Result Date: 02/17/2018 CLINICAL DATA:  NG tube placement. EXAM: ABDOMEN - 1 VIEW COMPARISON:  02/17/2017 FINDINGS: Nonspecific gaseous distension of the abdomen. Enteric catheter tip overlies the expected location of gastric body. Surgical drain seen in the pelvis. IMPRESSION: Enteric catheter tip overlies the expected location of the gastric body. Electronically Signed   By: Fidela Salisbury M.D.   On: 02/17/2018 16:14   Dg Abd 1 View  Result Date: 02/17/2018 CLINICAL DATA:  Nasal/orogastric tube placement. EXAM: ABDOMEN - 1 VIEW COMPARISON:  Chest radiograph, 02/14/2018. FINDINGS: Nasal/orogastric tube extends below the hemidiaphragms projecting in the mid stomach. Endotracheal tube, left PICC and right anterior chest wall Port-A-Cath are stable and well positioned. Irregularly thickened bronchovascular markings and hazy airspace opacity in the right mid lung is without significant change from the most recent prior study allowing for differences in patient positioning. Bilateral IMPRESSION: 1. Nasal/orogastric tube tip projects in the mid stomach, well positioned. Electronically Signed   By: Lajean Manes M.D.   On: 02/17/2018 10:45   Dg Chest Port 1 View  Result Date: 02/18/2018 CLINICAL DATA:  Respiratory failure EXAM: PORTABLE CHEST 1 VIEW COMPARISON:  02/17/2018 FINDINGS: Cardiac shadow is stable. Right jugular central venous port is again seen. Nasogastric catheter and tracheostomy tube is well as a left-sided PICC line are seen in satisfactory position. Patchy infiltrative densities are identified throughout the right lung stable from the prior exam. The left lung shows similar changes in the base also stable from the prior exam. No new  focal abnormality is noted. IMPRESSION: Stable appearing bilateral infiltrates similar to that seen on the prior exam Tubes and lines as described. Electronically Signed   By: Inez Catalina M.D.   On: 02/18/2018 07:08   Dg Chest Port 1 View  Result Date: 02/17/2018 CLINICAL DATA:  Post placement of tracheostomy EXAM: PORTABLE CHEST 1 VIEW COMPARISON:  Chest x-ray of 02/14/2018 FINDINGS: The endotracheal tube has been removed and tracheostomy is present appearing to be in good position on this portable view. Airspace disease may have increased somewhat within the right mid upper lung suspicious for pneumonia with patchy opacity at the lung bases as well bilaterally also possibly representing multifocal pneumonia. A small left effusion cannot be excluded. The heart is mildly enlarged. Right-sided Port-A-Cath tip overlies the lower SVC and a left PICC line tip is noted to the expected SVC RA junction. Loop recorder overlies the left chest. IMPRESSION: 1. Increasing opacities bilaterally suspicious for multifocal pneumonia. 2. Endotracheal tube removed with placement of tracheostomy. 3. Placement of left PICC line with the tip at the expected SVC-RA junction. Electronically Signed   By: Ivar Drape M.D.   On: 02/17/2018 14:02   Micro Data: Blood Culture x2 8/26 >> neg Sputum 8/27: Abundant yeast, few Candida albicans Blood Cx 9/1 >> negative Respiratory 9/1 >> few Candida Urine 9/1 >> negative Peritoneal fluid 8/31 >> neg Abd Drainage 9/5 >> few lactobacillus  JP Drainage 9/8 >> E-Coli C-Diff 9/12 >>  Antimicrobials:  Meropenem 8/26 >> 9/11 Vancomycin 8/27 >> 8/30; 9/1 >> 9/11 Eraxis 8/27 >>  Unasyn 9/11 >>   Studies: CT abdomen 8/26: Hemic bowel with portal venous gas and bowel pneumatosis with large amount of pneumoperitoneum.  Unclear as to site of perforation.  Multiple pockets of air adjacent to the sigmoid colon.  Enhancement of the peritoneum concerning for peritonitis.  There was aortic  arterial sclerosis.  Echocardiogram 8/27: Particular ejection fraction 65 to 70%.  She has moderate LVH, and hyperdynamic LV function.  There is grade 1 diastolic dysfunction.  Trivial MR.  Normal left atrial size.  The IV C was not visualized  CT abdomen 9/2 >> drainage catheters within the pelvis, RUQ, LUQ.  Significant interval decrease in size of previously described intra-abdominal fluid, no residual free intraperitoneal air. There is a persistent loculated fluid & gas collection along the left pelvic sidewall measuring up to 10 cm.  Moderate bilateral pleural effusions, underlying opacities favored to represent atelectasis.  Marked wall thickening of the sigmoid colon.  Peripheral low attenuation within the spleen concerning for splenic infarcts.  Wall thickening of the urinary bladder.   CT abdomen 9/6: Improving left lateral pelvic fluid collection, resolved peritoneal fluid in the left lower abdomen.  Concentric thickening of the sigmoid colon.  Decreased bilateral effusion and atelectasis.  CT ABD 9/9 Similar to slight decrease in size of ill-defined complex fluid and gas in the lateral left hemipelvis. 2. No evidence of contrast communication between the thickened sigmoid and this pelvic sidewall process. 3. Foley catheter and air within theurinary bladder, which is not decompressed. Correlate with catheter function.   Assessment & Plan:   Acute on Chronic hypoxemic respiratory failure in the setting of aspiration pneumonia / ARDS.   Right pleural effusion- No sig pocket to drain on bedside US 9/5. S.p trach 02/17/18   02/18/2018 - doing SBT but too frail to liberate    P: PSV in day and as toleratd + Full vent support at night PRN sedation Working on reconditioning - LTAC consult placed    Status post exploratory laparotomy for free air on 8/26 ; following bilateral salpingo-oophorectomy, omentectomy, and debulking surgery on 8/22. Developed fluid collections not amenable to IR  drainage.  Reducing in size on CT.P: P: Recommendations per GYN/ONC Follow JP drainage  ABX narrowed for cultures   Severe Sepsis - with neutropenia in setting of peritonitis (microperforation), complicated by aspiration pneumonia.   02/18/2018 - ongoing fevers. New low dose pressor need   P: Fluid bolus and wean off levophed Antibiotics per ID  Follow fever curve / WBC trend Follow up C-Diff assessment   Sinus tachycardia Hypertension Grade 1 diastolic dysfunction  Elevated Troponin - initially, resolved.  Likely demand Bradycardia - periods of brady to the 30's Labile BP - w/episodes of hypotension seemingly exacerbated by sedation P: Lopressor 55m IV Q6, hold for SBP <90 PRN hydralazine  Lasix 40 mg IV Q12  Tele monitoring   Acute metabolic encephalopathy - in setting of critical illness, sedation  02/18/18 - appears resolved  P: RASS Goal: 0 to -1  Minimize sedating medications as able PRN versed, PRN fentanyl  Seroquel, paxil   Electrolyte Disturbances -  Hypokalemia Hypomagnesemia Hypophosphatemia  02/18/18 - repleted   P: Replace electrolytes as indicated Avoid nephrotoxic agents, ensure adequate renal perfusion  Severe Protein Calorie Malnutrition, prealbumin 3.5 P: Continue TF  PPI for stress ulcer prophylaxis    #Heme Postoperative anemia.   Recent Labs  Lab 02/16/18 0455 02/17/18 0500 02/18/18 0414  HGB 8.0* 8.6* 7.4*  HCT 25.6* 27.3* 23.7*  WBC 6.7 9.2 9.4  PLT 275 313 302    P: Trend CBC  - PRBC for hgb </= 6.9gm%    - exceptions are   -  if ACS susepcted/confirmed then transfuse for hgb </= 8.0gm%,  or    -  active bleeding with hemodynamic  instability, then transfuse regardless of hemoglobin value   At at all times try to transfuse 1 unit prbc as possible with exception of active hemorrhage    Hyperglycemia P: SSI     Best practice/Goals of care  DVT prophylaxis: LMWH GI prophylaxis: PPI Diet: TF  Mobility: BR Code  Status: Full Code Family Communication:  Son updated 02/18/2018   Disposition / Summary of Today's Plan 02/18/2018   PCCM will see again 02/19/18 if on pressors. Otherwise 2x/week for vent wean. Needs LTAC to continue goals of care. Medical care from 02/19/18 given to triad by APP    SIGNATURE    Dr. Brand Males, M.D., F.C.C.P,  Pulmonary and Critical Care Medicine Staff Physician, [place] Director - Interstitial Lung Disease  Program  [facility] [place] [facility] [place], [place], [address]  Pager: [phone], If no answer or between  15:00h - 7:00h: call [phone] Telephone: [phone]  9:38 AM 02/18/2018

## 2018-02-18 NOTE — Progress Notes (Signed)
[facility] ADULT ICU REPLACEMENT PROTOCOL FOR AM LAB REPLACEMENT ONLY  The patient does apply for the [facility] Adult ICU Electrolyte Replacment Protocol based on the criteria listed below:   1. Is GFR >/= 40 ml/min? Yes.    Patient's GFR today is >60 2. Is urine output >/= 0.5 ml/kg/hr for the last 6 hours? Yes.   Patient's UOP is 1.4 ml/kg/hr 3. Is BUN < 60 mg/dL? Yes.    Patient's BUN today is 14 4. Abnormal electrolyte(s): K3.0.Mg1.8,Phos2.2 5. Ordered repletion with: per protocol 6. If a panic level lab has been reported, has the CCM MD in charge been notified? Yes.  .   Physician:  Mauri Brooklyn MD  [employer]ear Clock 02/18/2018 6:41 AM

## 2018-02-19 ENCOUNTER — Inpatient Hospital Stay (HOSPITAL_COMMUNITY): Payer: Medicare PPO

## 2018-02-19 DIAGNOSIS — J9620 Acute and chronic respiratory failure, unspecified whether with hypoxia or hypercapnia: Secondary | ICD-10-CM

## 2018-02-19 LAB — CBC WITH DIFFERENTIAL/PLATELET
BASOS ABS: 0 10*3/uL (ref 0.0–0.1)
Basophils Relative: 0 %
EOS ABS: 0.5 10*3/uL (ref 0.0–0.7)
Eosinophils Relative: 6 %
HCT: 21.9 % — ABNORMAL LOW (ref 36.0–46.0)
HEMOGLOBIN: 6.9 g/dL — AB (ref 12.0–15.0)
LYMPHS PCT: 19 %
Lymphs Abs: 1.5 10*3/uL (ref 0.7–4.0)
MCH: 25.6 pg — ABNORMAL LOW (ref 26.0–34.0)
MCHC: 31.5 g/dL (ref 30.0–36.0)
MCV: 81.1 fL (ref 78.0–100.0)
Monocytes Absolute: 0.6 10*3/uL (ref 0.1–1.0)
Monocytes Relative: 8 %
NEUTROS PCT: 67 %
Neutro Abs: 5.2 10*3/uL (ref 1.7–7.7)
Platelets: 307 10*3/uL (ref 150–400)
RBC: 2.7 MIL/uL — ABNORMAL LOW (ref 3.87–5.11)
RDW: 24.1 % — ABNORMAL HIGH (ref 11.5–15.5)
WBC: 7.8 10*3/uL (ref 4.0–10.5)

## 2018-02-19 LAB — GLUCOSE, CAPILLARY
GLUCOSE-CAPILLARY: 123 mg/dL — AB (ref 70–99)
Glucose-Capillary: 111 mg/dL — ABNORMAL HIGH (ref 70–99)
Glucose-Capillary: 114 mg/dL — ABNORMAL HIGH (ref 70–99)
Glucose-Capillary: 129 mg/dL — ABNORMAL HIGH (ref 70–99)
Glucose-Capillary: 71 mg/dL (ref 70–99)
Glucose-Capillary: 79 mg/dL (ref 70–99)

## 2018-02-19 LAB — MAGNESIUM: MAGNESIUM: 1.6 mg/dL — AB (ref 1.7–2.4)

## 2018-02-19 LAB — BASIC METABOLIC PANEL
Anion gap: 10 (ref 5–15)
BUN: 11 mg/dL (ref 8–23)
CALCIUM: 7.7 mg/dL — AB (ref 8.9–10.3)
CO2: 40 mmol/L — ABNORMAL HIGH (ref 22–32)
CREATININE: 0.45 mg/dL (ref 0.44–1.00)
Chloride: 92 mmol/L — ABNORMAL LOW (ref 98–111)
Glucose, Bld: 113 mg/dL — ABNORMAL HIGH (ref 70–99)
Potassium: 2.6 mmol/L — CL (ref 3.5–5.1)
SODIUM: 142 mmol/L (ref 135–145)

## 2018-02-19 LAB — HEMOGLOBIN AND HEMATOCRIT, BLOOD
HCT: 25.2 % — ABNORMAL LOW (ref 36.0–46.0)
HEMOGLOBIN: 8.1 g/dL — AB (ref 12.0–15.0)

## 2018-02-19 LAB — PHOSPHORUS: PHOSPHORUS: 3.3 mg/dL (ref 2.5–4.6)

## 2018-02-19 LAB — PREPARE RBC (CROSSMATCH)

## 2018-02-19 MED ORDER — POTASSIUM CHLORIDE 10 MEQ/50ML IV SOLN
10.0000 meq | INTRAVENOUS | Status: AC
  Administered 2018-02-19 (×4): 10 meq via INTRAVENOUS
  Filled 2018-02-19 (×4): qty 50

## 2018-02-19 MED ORDER — SODIUM CHLORIDE 0.9% IV SOLUTION
Freq: Once | INTRAVENOUS | Status: AC
  Administered 2018-02-19: 11:00:00 via INTRAVENOUS

## 2018-02-19 MED ORDER — MAGNESIUM SULFATE 2 GM/50ML IV SOLN
2.0000 g | Freq: Once | INTRAVENOUS | Status: AC
  Administered 2018-02-19: 2 g via INTRAVENOUS
  Filled 2018-02-19: qty 50

## 2018-02-19 NOTE — Progress Notes (Addendum)
19 Days Post-Op Procedure(s) (LRB): EXPLORATORY LAPAROTOMY, ABDOMINAL WASHOUT (N/A)  Subjective: Somnolent but responsive  Objective: Vital signs in last 24 hours: Temp:  [99.9 F (37.7 C)-101.1 F (38.4 C)] 100.8 F (38.2 C) (09/14 1200) Pulse Rate:  [96-128] 107 (09/14 1200) Resp:  [7-34] 32 (09/14 1200) BP: (91-167)/(34-86) 91/34 (09/14 1200) SpO2:  [99 %-100 %] 100 % (09/14 1200) FiO2 (%):  [30 %] 30 % (09/14 0746) Weight:  [59 kg] 59 kg (09/14 0544) Last BM Date: 02/18/18  Intake/Output from previous day: 09/13 0701 - 09/14 0700 In: 2415.1 [I.V.:118.2; BP/ZW:2585.2; IV Piggyback:898.6] Out: 2315 [Urine:2065; Stool:250]  Physical Examination: General: Somnolent GI: wound mostly granulation tissue; focus of fibrinous exudate at the inferior margin--sharply debrided; drain contents serous, slightly blood-tinged, scant particulate matter  Extremities: extremities normal, atraumatic, no cyanosis or edema   Labs: WBC/Hgb/Hct/Plts:  7.8/6.9/21.9/307 (09/14 7782) BUN/Cr/glu/ALT/AST/amyl/lip:  11/0.45/--/--/--/--/-- (09/14 4235)   Assessment:  72 y.o. s/p Procedure(s): OMENTECTOMY RADICAL DEBULKING EXPLORATORY LAPAROTOMY WITH REMOVAL OF BILATERAL TUBES AND OVARIES  EXPLORATORY LAPAROTOMY, ABDOMINAL WASHOUT  Pain:  Pain is well-controlled   Heme: Severe anemia  Pulm:  S/P tracheostomy; Critical care notes appreciated  CV: No longer requiring pressor support; labile B/P  ID: Intraabdominal collection/abscess; continues to have febrile episodes   GI:  Undergoing tube feeds    FEN: Electrolyte/metabolic derangements  Renal: Creatinine in range  Prophylaxis: pharmacologic prophylaxis (with any of the following: enoxaparin (Lovenox) 40mg  SQ 2 hours prior to surgery then every day) and intermittent pneumatic compression boots.  Plan: >continue to monitor drainage of the intraabdominal process >continue current antibiotic therapy per ID >continue wound  care >continue management per Critical care  LOS: 23 days    Lahoma Crocker 02/19/2018, 12:15 PM

## 2018-02-19 NOTE — Progress Notes (Signed)
Patient placed on PS/CPAP mode at 0745 peep 5, ps 5, O2 30%.  Increased ps to 10 at 1200.  Placed pt back on full support at 1700.  Patient tolerated well.  Will attempt to wean on ATC in the morning.

## 2018-02-19 NOTE — Progress Notes (Signed)
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  She is completed 3 cycles of chemotherapy and is now status post bilateral salpingo-oophorectomy and omentectomy with debulking on 8/22.   8/26 transferred to ICU w/ concern for perforation w/ free air noted.  Imipenem started. Went for exploratory laparotomy with washout.  No significant contamination identified, presumed spontaneously closed microperf. Returned with abdominal drains postoperative chest x-ray showing evidence of aspiration pneumonia; mixed respiratory and metabolic acidosis & in septic shock. Required intubation.   8/27 GI medicine consulted, did not feel she had overt GIB.  Rec's prophylaxis with PPI.  Signed off.  Weaning pressors. Hgb dropped from 8.8 to 7.4; got 2 units of blood,  febrile, added. vancomycin.  Neupogen given for neutropenia.   8/28: Pressors off/shock resolved. NAG acidosis and Lactic acidosis resolved.  FiO2/PEEP down, attempted PSV.  WBC count up.  Started TPN.   8/29: FiO2 and PEEP requirements increased, had wean these down to 40/5 now 60/10.  Chest x-ray with worsening right-sided airspace disease likely reflecting de-recruitment. Delirium remains significant obstacle.  Beta-blocker, and clonidine patch added for hypertension   8/30: Diuresed 4.5 L the day prior with Lasix.  Little more awake.  X-ray improved.  Vancomycin discontinued.  Continuing diuresis, RASS goal changed to -1, dropping PEEP to 8.  9/01:  Vanco added back with fevers, clonidine patch  9/02-9/6: Delirium. Sedation difficult, diuresing, remains on abx. spiking temps, loculated fluid collection identified in left pelvis. Not amedable to drainage.  ID consulted. Started seroquel. 9/4. Upper 2 drains  removed. Staples removed.from incision site.  9/4: Infectious disease.  Consulted by family.  Had no additional rec's  9/6: WBC curve normalized but still having low gd fever and purulent drainage from wound. CT shows abd fluid collection better  9/7-9/8: Failing pressure support weans.  9/9 febrile 101F. WBC ok. Not on pressors. Not on sedation gtt. Husband deferring decisions about care to son (newly married). Suspicion for per > CT ABD with decrease in fluid collection   9/10  Sister at bedside - says she is not in favor of trach but says is the decision of son/dpoa. Son met with Dr Alvy Bimler for goals of care - current status precludes chemo but goal is trach and full code and full medical care and later chemo when ECOG improves.  Low grade fever persists - ID following. More awake today. No Perf leak on CT abd and restarted TF  9/11 Ongoing fever.  Failed SBT.  Pending trach 9/12  9/12- s/p Trach - Dr Nelda Marseille  9/13 - s.p trach. Running fevers 101.F. K, Mag and phos - repleted. BAck on levophed 6mg. Doing SBT  Subjective / interval events   9/14 - stil febrile 101F though wbc ok. Hb < 7g%. No bleeding. Not on pressors.  Interacting with normal mental status. BAck pn PSV. Overnight on PRVC. RT wants to try ATC   Objective    Blood pressure (!) 108/52, pulse (!) 106, temperature (!) 100.8 F (38.2 C), resp. rate 16, height '5\' 3"'$  (1.6 m), weight 59 kg, SpO2 100 %.    Vent Mode: PSV;CPAP FiO2 (%):  [30 %] 30 % Set Rate:  [20 bmp] 20 bmp Vt Set:  [330 mL] 330 mL PEEP:  [5  cmH20] 5 cmH20 Pressure Support:  [5 cmH20-10 cmH20] 5 cmH20 Plateau Pressure:  [13 cmH20-21 cmH20] 14 cmH20   Intake/Output Summary (Last 24 hours) at 02/19/2018 0834 Last data filed at 02/19/2018 0403 Gross per 24 hour  Intake 2415.1 ml  Output 2165 ml  Net 250.1 ml   Filed Weights   02/17/18 0500 02/18/18 0436 02/19/18 0544  Weight: 58.8 kg 62.1 kg 59 kg     EXAM  General Appearance:  Frail female,  looking better,  Awake,  Head:  Normocephalic, without obvious abnormality, atraumatic Eyes:  PERRL - yes, conjunctiva/corneas - clear     Ears:  Normal external ear canals, both ears Nose:  G tube - yes Throat:  ETT TUBE - no but has trach - site is clean , OG tube - no Neck:  Supple,  No enlargement/tenderness/nodules Lungs: Clear to auscultation bilaterally, Heart:  S1 and S2 normal, no murmur, CVP - no.  Pressors - no Abdomen:  Soft, no masses, no organomegaly Genitalia / Rectal:  Not done Extremities:  Extremities- intact Skin:  ntact in exposed areas . Neurologic:  Sedation - none -> RASS - +1 . Moves all 4s - yes. CAM-ICU - neg . Orientation - seems fully oriented. Diffusely weak      Labs    PULMONARY No results for input(s): PHART, PCO2ART, PO2ART, HCO3, TCO2, O2SAT in the last 168 hours.  Invalid input(s): PCO2, PO2  CBC Recent Labs  Lab 02/17/18 0500 02/18/18 0414 02/19/18 0548  HGB 8.6* 7.4* 6.9*  HCT 27.3* 23.7* 21.9*  WBC 9.2 9.4 7.8  PLT 313 302 307    COAGULATION Recent Labs  Lab 02/17/18 0500 02/17/18 0838  INR 1.28 1.28   CARDIAC   No results for input(s): TROPONINI in the last 168 hours. No results for input(s): PROBNP in the last 168 hours.  CHEMISTRY Recent Labs  Lab 02/14/18 0437  02/15/18 0037 02/15/18 0554 02/16/18 0455 02/17/18 0500 02/17/18 0838 02/18/18 0414 02/19/18 0548  NA  --   --  144  --   --   --  143 141 142  K  --    < > 2.7*  --   --   --  2.6* 3.0* 2.6*  CL  --   --  86*  --   --   --  87* 91* 92*  CO2  --   --  43*  --   --   --  45* 42* 40*  GLUCOSE  --   --  99  --   --   --  133* 226* 113*  BUN  --   --  18  --   --   --  '14 14 11  '$ CREATININE 0.53  --  0.64  --   --   --  0.48 0.53 0.45  CALCIUM  --   --  8.3*  --   --   --  8.2* 7.7* 7.7*  MG  --   --   --  1.3* 1.9 1.7  --  1.8 1.6*  PHOS  --   --   --  4.3 2.2* 2.7  --  2.2* 3.3   < > = values in this interval not displayed.   Estimated Creatinine  Clearance: 52.6 mL/min (by C-G formula based on SCr of 0.45 mg/dL).  LIVER Recent Labs  Lab 02/17/18 0500 02/17/18 0838  INR 1.28 1.28   INFECTIOUS No results for input(s): LATICACIDVEN, PROCALCITON in the last  168 hours.  ENDOCRINE CBG (last 3)  Recent Labs    02/18/18 2344 02/19/18 0323 02/19/18 0801  GLUCAP 149* 71 114*   IMAGING  Dg Abd 1 View  Result Date: 02/19/2018 CLINICAL DATA:  NG tube placement. EXAM: ABDOMEN - 1 VIEW COMPARISON:  Radiograph 02/17/2018 FINDINGS: Tip and side port of the enteric tube below the diaphragm in the stomach. No bowel dilatation to suggest obstruction. Retrocardiac opacity. IMPRESSION: Tip and side port of the enteric tube below the diaphragm in the stomach. Electronically Signed   By: Keith Rake M.D.   On: 02/19/2018 05:35   Dg Abd 1 View  Result Date: 02/17/2018 CLINICAL DATA:  NG tube placement. EXAM: ABDOMEN - 1 VIEW COMPARISON:  02/17/2017 FINDINGS: Nonspecific gaseous distension of the abdomen. Enteric catheter tip overlies the expected location of gastric body. Surgical drain seen in the pelvis. IMPRESSION: Enteric catheter tip overlies the expected location of the gastric body. Electronically Signed   By: Fidela Salisbury M.D.   On: 02/17/2018 16:14   Dg Abd 1 View  Result Date: 02/17/2018 CLINICAL DATA:  Nasal/orogastric tube placement. EXAM: ABDOMEN - 1 VIEW COMPARISON:  Chest radiograph, 02/14/2018. FINDINGS: Nasal/orogastric tube extends below the hemidiaphragms projecting in the mid stomach. Endotracheal tube, left PICC and right anterior chest wall Port-A-Cath are stable and well positioned. Irregularly thickened bronchovascular markings and hazy airspace opacity in the right mid lung is without significant change from the most recent prior study allowing for differences in patient positioning. Bilateral IMPRESSION: 1. Nasal/orogastric tube tip projects in the mid stomach, well positioned. Electronically Signed   By: Lajean Manes M.D.   On: 02/17/2018 10:45   Dg Chest Port 1 View  Result Date: 02/18/2018 CLINICAL DATA:  Respiratory failure EXAM: PORTABLE CHEST 1 VIEW COMPARISON:  02/17/2018 FINDINGS: Cardiac shadow is stable. Right jugular central venous port is again seen. Nasogastric catheter and tracheostomy tube is well as a left-sided PICC line are seen in satisfactory position. Patchy infiltrative densities are identified throughout the right lung stable from the prior exam. The left lung shows similar changes in the base also stable from the prior exam. No new focal abnormality is noted. IMPRESSION: Stable appearing bilateral infiltrates similar to that seen on the prior exam Tubes and lines as described. Electronically Signed   By: Inez Catalina M.D.   On: 02/18/2018 07:08   Dg Chest Port 1 View  Result Date: 02/17/2018 CLINICAL DATA:  Post placement of tracheostomy EXAM: PORTABLE CHEST 1 VIEW COMPARISON:  Chest x-ray of 02/14/2018 FINDINGS: The endotracheal tube has been removed and tracheostomy is present appearing to be in good position on this portable view. Airspace disease may have increased somewhat within the right mid upper lung suspicious for pneumonia with patchy opacity at the lung bases as well bilaterally also possibly representing multifocal pneumonia. A small left effusion cannot be excluded. The heart is mildly enlarged. Right-sided Port-A-Cath tip overlies the lower SVC and a left PICC line tip is noted to the expected SVC RA junction. Loop recorder overlies the left chest. IMPRESSION: 1. Increasing opacities bilaterally suspicious for multifocal pneumonia. 2. Endotracheal tube removed with placement of tracheostomy. 3. Placement of left PICC line with the tip at the expected SVC-RA junction. Electronically Signed   By: Ivar Drape M.D.   On: 02/17/2018 14:02   Micro Data: Blood Culture x2 8/26 >> neg Sputum 8/27: Abundant yeast, few Candida albicans Blood Cx 9/1 >> negative Respiratory 9/1 >>  few Candida Urine  9/1 >> negative Peritoneal fluid 8/31 >> neg Abd Drainage 9/5 >> few lactobacillus  JP Drainage 9/8 >> E-Coli C-Diff 9/12 >>  Antimicrobials:  Meropenem 8/26 >> 9/11 Vancomycin 8/27 >> 8/30; 9/1 >> 9/11 Eraxis 8/27 >>  Unasyn 9/11 >>   Studies: CT abdomen 8/26: Hemic bowel with portal venous gas and bowel pneumatosis with large amount of pneumoperitoneum.  Unclear as to site of perforation.  Multiple pockets of air adjacent to the sigmoid colon.  Enhancement of the peritoneum concerning for peritonitis.  There was aortic arterial sclerosis.  Echocardiogram 8/27: Particular ejection fraction 65 to 70%.  She has moderate LVH, and hyperdynamic LV function.  There is grade 1 diastolic dysfunction.  Trivial MR.  Normal left atrial size.  The IV C was not visualized  CT abdomen 9/2 >> drainage catheters within the pelvis, RUQ, LUQ.  Significant interval decrease in size of previously described intra-abdominal fluid, no residual free intraperitoneal air. There is a persistent loculated fluid & gas collection along the left pelvic sidewall measuring up to 10 cm.  Moderate bilateral pleural effusions, underlying opacities favored to represent atelectasis.  Marked wall thickening of the sigmoid colon.  Peripheral low attenuation within the spleen concerning for splenic infarcts.  Wall thickening of the urinary bladder.   CT abdomen 9/6: Improving left lateral pelvic fluid collection, resolved peritoneal fluid in the left lower abdomen.  Concentric thickening of the sigmoid colon.  Decreased bilateral effusion and atelectasis.  CT ABD 9/9 Similar to slight decrease in size of ill-defined complex fluid and gas in the lateral left hemipelvis. 2. No evidence of contrast communication between the thickened sigmoid and this pelvic sidewall process. 3. Foley catheter and air within theurinary bladder, which is not decompressed. Correlate with catheter function.   Assessment & Plan:   Acute  on Chronic hypoxemic respiratory failure in the setting of aspiration pneumonia / ARDS.   Right pleural effusion- No sig pocket to drain on bedside US 9/5. S.p trach 02/17/18   02/19/2018 - Doing SBT . Did PRVC last night. Seems strong enough to try ATC for few hours this morning but definitely not liberate fully. Family accepted select    P: ATC for few hours this moring 02/19/18 and then PSV Overnight PRVC LTAC - needed    Status post exploratory laparotomy for free air on 8/26 ; following bilateral salpingo-oophorectomy, omentectomy, and debulking surgery on 8/22. Developed fluid collections not amenable to IR drainage.  Reducing in size on CT.P: P: Recommendations per GYN/ONC Follow JP drainage    Severe Sepsis - with neutropenia in setting of peritonitis (microperforation), complicated by aspiration pneumonia.   02/19/2018 - ongoing fevers. Off pressors  PLAN Antibiotics per ID     Sinus tachycardia Hypertension Grade 1 diastolic dysfunction  Elevated Troponin - initially, resolved.  Likely demand Bradycardia - periods of brady to the 30's Labile BP - w/episodes of hypotension seemingly exacerbated by sedation P: pe TRiad  Acute metabolic encephalopathy - in setting of critical illness, sedation  02/18/18 - appears resolved and still resolved as of 02/19/2018   P: RASS Goal: 0 to -1  Minimize sedating medications as able PRN versed, PRN fentanyl  Seroquel, paxil  - TRH to wean  Electrolyte Disturbances -    P: Per TRH Avoid nephrotoxic agents, ensure adequate renal perfusion  Severe Protein Calorie Malnutrition, prealbumin 3.5 P: Continue TF  PPI for stress ulcer prophylaxis    #Heme Postoperative anemia.   Recent Labs  Lab 02/17/18 0500  02/18/18 0414 02/19/18 0548  HGB 8.6* 7.4* 6.9*  HCT 27.3* 23.7* 21.9*  WBC 9.2 9.4 7.8  PLT 313 302 307    P: Per [facility] - PRBC for hgb </= 6.9gm%    - exceptions are   -  if ACS susepcted/confirmed then  transfuse for hgb </= 8.0gm%,  or    -  active bleeding with hemodynamic instability, then transfuse regardless of hemoglobin value   At at all times try to transfuse 1 unit prbc as possible with exception of active hemorrhage    Hyperglycemia P: SSI     Best practice/Goals of care  DVT prophylaxis: LMWH GI prophylaxis: PPI Diet: TF  Mobility: BR Code Status: Full Code Family Communication:  Son updated 02/19/2018   Disposition / Summary of Today's Plan 02/19/2018   Aim for ATC few hours . Otherwise PSV in day an dPRVC at night. LTAC needed. Medical mgmt by [facility]. Primary is GYN   SIGNATURE    Dr. Brand Males, M.D., F.C.C.P,  Pulmonary and Critical Care Medicine Staff Physician, [place] Director - Interstitial Lung Disease  Program  Pulmonary [place] [facility] [place], [place], [address]  Pager: [phone], If no answer or between  15:00h - 7:00h: call [phone] Telephone: [phone]  8:34 AM 02/19/2018

## 2018-02-19 NOTE — Progress Notes (Signed)
CRITICAL VALUE ALERT  Critical Value:  Hgb 6.9  Date & Time Notied:  02/19/18 0815  Provider Notified: Chase Caller, MD  Orders Received/Actions taken:

## 2018-02-19 NOTE — Progress Notes (Signed)
PROGRESS NOTE    Jo Parker  WLS:[id number] DOB: 12-Mar-1946 DOA: 01/27/2018 PCP: [name], MD  Brief Narrative: 72 year old African-American female admitted 8/ 22 2019 under GYN service with a diagnosis of ovarian adenocarcinoma status post chemotherapy and bilateral salpingo-oophorectomy and omentectomy with debulking on 01/27/18.  On 53 CT scan concerning for perforation with free air started on imipenem went for exploratory laparotomy with mesh washout no significant evidence of bowel perforation was noted patient was intubated.  Patient failed extubation trials and she had a trach placed on 02/17/2018.  She was seen by GI for anemia recommended PPI no GI evaluation was done.  Patient continues to be febrile with a normal white count pressors weaned off.  TRH called back to consult 02/19/2018.  Assessment & Plan:   Principal Problem:   Pelvic abscess in female Active Problems:   Primary peritoneal carcinomatosis ([name])   Severe protein-calorie malnutrition ([name])   Severe sepsis ([name])   COPD (chronic obstructive pulmonary disease) ([name])   HLD (hyperlipidemia)   Absolute anemia   White coat syndrome with hypertension   Ovarian cancer ([name])   AKI (acute kidney injury) ([place])   Acute metabolic encephalopathy   Acute respiratory distress   Low urine output   Somnolence   At risk for fluid volume overload   Free intraperitoneal air   Acute on chronic respiratory failure ([name])   Pressure injury of skin   Status post bilateral salpingo-oophorectomy   Peritonitis ([place])   HCAP (healthcare-associated pneumonia)   Postoperative wound infection   Aspiration pneumonia ([name])  #1 aspiration pneumonia/ARDS status post trach 02/17/2018 #2 status post exploratory laparotomy and bilateral salpingo-oophorectomy omentectomy and debulking developed fluid collections now with JP drain in place #3 sepsis with aspiration pneumonia ARDS and peritonitis secondary to microperforation with ongoing  fever normal white count off pressors today 02/19/2018 #4 hypertension #5 previous of bradycardia #6 severe anemia postop anemia transfuse 1 unit packed RBCs today. #7 hypokalemia and hypomagnesemia being repleted DVT prophylaxis:lovenox Code Status: full Family Communication:dw son Disposition Plan LTAC  Antimicrobials:UNASYN ERAXIS VANCO  Subjective: Patient awake trach in place notes yes or no to questions being asked son by the bedside  Objective: Vitals:   02/19/18 1145 02/19/18 1200 02/19/18 1227 02/19/18 1300  BP: (!) 126/47 (!) 91/34 (!) 115/46 (!) 118/43  Pulse: (!) 116 (!) 107 (!) 109 (!) 107  Resp: (!) 26 (!) 32 (!) 30 (!) 2  Temp: (!) 100.9 F (38.3 C) (!) 100.8 F (38.2 C) (!) 100.6 F (38.1 C) (!) 100.4 F (38 C)  TempSrc:  Core Core   SpO2: 99% 100% 100% 100%  Weight:      Height:        Intake/Output Summary (Last 24 hours) at 02/19/2018 1417 Last data filed at 02/19/2018 1318 Gross per 24 hour  Intake 2120.73 ml  Output 2175 ml  Net -54.27 ml   Filed Weights   02/17/18 0500 02/18/18 0436 02/19/18 0544  Weight: 58.8 kg 62.1 kg 59 kg    Examination:  General exam: Appears calm and comfortable  Respiratory system: COARSE BREATH SOUNDS to auscultation. Respiratory effort normal. Cardiovascular system: S1 & S2 heard, RRR. No JVD, murmurs, rubs, gallops or clicks. No pedal edema. Gastrointestinal system: Abdomen is nondistended, soft and nontender. No organomegaly or masses felt. Normal bowel sounds heard. Central nervous system AWAKE  Extremities: Symmetric 5 x 5 power. Skin: No rashes, lesions or ulcers     Data Reviewed: I have personally reviewed following  labs and imaging studies  CBC: Recent Labs  Lab 02/15/18 0554 02/16/18 0455 02/17/18 0500 02/18/18 0414 02/19/18 0548  WBC 8.0 6.7 9.2 9.4 7.8  NEUTROABS 5.7 4.8 7.6 6.8 5.2  HGB 8.1* 8.0* 8.6* 7.4* 6.9*  HCT 26.3* 25.6* 27.3* 23.7* 21.9*  MCV 82.4 81.5 81.7 81.2 81.1  PLT 264 275  313 302 662   Basic Metabolic Panel: Recent Labs  Lab 02/14/18 0437 02/15/18 0037 02/15/18 0554 02/16/18 0455 02/17/18 0500 02/17/18 0838 02/18/18 0414 02/19/18 0548  NA  --  144  --   --   --  143 141 142  K  --  2.7*  --   --   --  2.6* 3.0* 2.6*  CL  --  86*  --   --   --  87* 91* 92*  CO2  --  43*  --   --   --  45* 42* 40*  GLUCOSE  --  99  --   --   --  133* 226* 113*  BUN  --  18  --   --   --  '14 14 11  '$ CREATININE 0.53 0.64  --   --   --  0.48 0.53 0.45  CALCIUM  --  8.3*  --   --   --  8.2* 7.7* 7.7*  MG  --   --  1.3* 1.9 1.7  --  1.8 1.6*  PHOS  --   --  4.3 2.2* 2.7  --  2.2* 3.3   GFR: Estimated Creatinine Clearance: 52.6 mL/min (by C-G formula based on SCr of 0.45 mg/dL). Liver Function Tests: No results for input(s): AST, ALT, ALKPHOS, BILITOT, PROT, ALBUMIN in the last 168 hours. No results for input(s): LIPASE, AMYLASE in the last 168 hours. No results for input(s): AMMONIA in the last 168 hours. Coagulation Profile: Recent Labs  Lab 02/17/18 0500 02/17/18 0838  INR 1.28 1.28   Cardiac Enzymes: No results for input(s): CKTOTAL, CKMB, CKMBINDEX, TROPONINI in the last 168 hours. BNP (last 3 results) No results for input(s): PROBNP in the last 8760 hours. HbA1C: No results for input(s): HGBA1C in the last 72 hours. CBG: Recent Labs  Lab 02/18/18 1944 02/18/18 2344 02/19/18 0323 02/19/18 0801 02/19/18 1218  GLUCAP 105* 149* 71 114* 123*   Lipid Profile: No results for input(s): CHOL, HDL, LDLCALC, TRIG, CHOLHDL, LDLDIRECT in the last 72 hours. Thyroid Function Tests: No results for input(s): TSH, T4TOTAL, FREET4, T3FREE, THYROIDAB in the last 72 hours. Anemia Panel: No results for input(s): VITAMINB12, FOLATE, FERRITIN, TIBC, IRON, RETICCTPCT in the last 72 hours. Sepsis Labs: No results for input(s): PROCALCITON, LATICACIDVEN in the last 168 hours.  Recent Results (from the past 240 hour(s))  Aerobic Culture (superficial specimen)      Status: None   Collection Time: 02/10/18 10:56 AM  Result Value Ref Range Status   Specimen Description   Final    ABDOMEN Performed at [facility] [address], [place], [place] [address]    Special Requests   Final    NONE Performed at [facility], [place] [address] [place], [place] [address]    Gram Stain   Final    RARE WBC PRESENT, PREDOMINANTLY PMN NO ORGANISMS SEEN    Culture   Final    FEW LACTOBACILLUS SPECIES Standardized susceptibility testing for this organism is not available. Performed at [facility], [place] [address], [place], [place] [address]  Report Status 02/14/2018 FINAL  Final  Aerobic/Anaerobic Culture (surgical/deep wound)     Status: None   Collection Time: 02/13/18 11:44 AM  Result Value Ref Range Status   Specimen Description JP DRAINAGE  Final   Special Requests NONE  Final   Gram Stain   Final    FEW WBC PRESENT, PREDOMINANTLY PMN MODERATE GRAM NEGATIVE RODS FEW GRAM POSITIVE RODS Performed at [facility], [place] [address], [place], [place] [address]    Culture   Final    RARE ESCHERICHIA COLI MODERATE LACTOBACILLUS SPECIES Standardized susceptibility testing for this organism is not available. MIXED ANAEROBIC FLORA PRESENT.  CALL LAB IF FURTHER IID REQUIRED.    Report Status 02/18/2018 FINAL  Final   Organism ID, Bacteria ESCHERICHIA COLI  Final      Susceptibility   Escherichia coli - MIC*    AMPICILLIN 4 SENSITIVE Sensitive     CEFAZOLIN <=4 SENSITIVE Sensitive     CEFEPIME <=1 SENSITIVE Sensitive     CEFTAZIDIME <=1 SENSITIVE Sensitive     CEFTRIAXONE <=1 SENSITIVE Sensitive     CIPROFLOXACIN <=0.25 SENSITIVE Sensitive     GENTAMICIN <=1 SENSITIVE Sensitive     IMIPENEM <=0.25 SENSITIVE Sensitive     TRIMETH/SULFA <=20 SENSITIVE Sensitive     AMPICILLIN/SULBACTAM <=2 SENSITIVE Sensitive     PIP/TAZO <=4 SENSITIVE Sensitive     Extended ESBL NEGATIVE Sensitive     * RARE  ESCHERICHIA COLI  Culture, Urine     Status: None   Collection Time: 02/17/18 12:45 PM  Result Value Ref Range Status   Specimen Description   Final    URINE, CATHETERIZED Performed at [facility] [address], [place], [place] [address]    Special Requests   Final    Immunocompromised Performed at [facility], [place] [address], [place], [place] [address]    Culture   Final    NO GROWTH Performed at [facility], [place] [address], [place], [place] [address]    Report Status 02/18/2018 FINAL  Final  Culture, blood (routine x 2)     Status: None (Preliminary result)   Collection Time: 02/17/18  1:12 PM  Result Value Ref Range Status   Specimen Description   Final    BLOOD RIGHT ANTECUBITAL Performed at [facility] [address], [place], [place] [address]    Special Requests   Final    BOTTLES DRAWN AEROBIC ONLY Blood Culture adequate volume Performed at [facility] [address], [place], [place] [address]    Culture   Final    NO GROWTH 2 DAYS Performed at [facility] [address], [place], [place] [address]    Report Status PENDING  Incomplete  Culture, blood (routine x 2)     Status: None (Preliminary result)   Collection Time: 02/17/18  1:14 PM  Result Value Ref Range Status   Specimen Description   Final    BLOOD RIGHT ANTECUBITAL Performed at [facility] [address] [place], [place] [address]    Special Requests   Final    BOTTLES DRAWN AEROBIC ONLY Blood Culture adequate volume Performed at [facility] [address], [place], [place] [address]    Culture   Final    NO GROWTH 2 DAYS Performed at [facility] [address], [place],  [address]    Report Status PENDING  Incomplete  C difficile quick scan w PCR reflex     Status: None   Collection Time: 02/17/18  3:08 PM  Result Value Ref Range Status   C  Diff antigen NEGATIVE NEGATIVE Final   C Diff toxin NEGATIVE NEGATIVE Final   C Diff interpretation No C. difficile detected.  Final    Comment: Performed at [facility], [place] [address], [place], [place] [address]         Radiology Studies: Dg Abd 1 View  Result Date: 02/19/2018 CLINICAL DATA:  NG tube placement. EXAM: ABDOMEN - 1 VIEW COMPARISON:  Radiograph 02/17/2018 FINDINGS: Tip and side port of the enteric tube below the diaphragm in the stomach. No bowel dilatation to suggest obstruction. Retrocardiac opacity. IMPRESSION: Tip and side port of the enteric tube below the diaphragm in the stomach. Electronically Signed   By: Keith Rake M.D.   On: 02/19/2018 05:35   Dg Abd 1 View  Result Date: 02/17/2018 CLINICAL DATA:  NG tube placement. EXAM: ABDOMEN - 1 VIEW COMPARISON:  02/17/2017 FINDINGS: Nonspecific gaseous distension of the abdomen. Enteric catheter tip overlies the expected location of gastric body. Surgical drain seen in the pelvis. IMPRESSION: Enteric catheter tip overlies the expected location of the gastric body. Electronically Signed   By: Fidela Salisbury M.D.   On: 02/17/2018 16:14   Dg Chest Port 1 View  Result Date: 02/18/2018 CLINICAL DATA:  Respiratory failure EXAM: PORTABLE CHEST 1 VIEW COMPARISON:  02/17/2018 FINDINGS: Cardiac shadow is stable. Right jugular central venous port is again seen. Nasogastric catheter and tracheostomy tube is well as a left-sided PICC line are seen in satisfactory position. Patchy infiltrative densities are identified throughout the right lung stable from the prior exam. The left lung shows similar changes in the base also stable from the prior exam. No new focal abnormality is noted. IMPRESSION: Stable appearing bilateral infiltrates similar to that seen on the prior exam Tubes and lines as described. Electronically Signed   By: Inez Catalina M.D.   On: 02/18/2018 07:08        Scheduled Meds: .  chlorhexidine gluconate (MEDLINE KIT)  15 mL Mouth Rinse BID  . citalopram  20 mg Per Tube Daily  . enoxaparin (LOVENOX) injection  40 mg Subcutaneous Q24H  . free water  100 mL Per Tube Q8H  . furosemide  40 mg Intravenous Q12H  . insulin aspart  0-20 Units Subcutaneous Q4H  . insulin aspart  4 Units Subcutaneous Q4H  . ipratropium-albuterol  3 mL Nebulization TID  . mouth rinse  15 mL Mouth Rinse 10 times per day  . pantoprazole sodium  40 mg Per Tube BID  . QUEtiapine  25 mg Per Tube Daily  . QUEtiapine  50 mg Oral QHS  . sodium chloride flush  10-40 mL Intracatheter Q12H   Continuous Infusions: . sodium chloride Stopped (02/15/18 0951)  . sodium chloride Stopped (02/19/18 1218)  . ampicillin-sulbactam (UNASYN) IV 3 g (02/19/18 1318)  . anidulafungin Stopped (02/19/18 1146)  . dextrose Stopped (02/18/18 0951)  . feeding supplement (VITAL AF 1.2 CAL) 1,000 mL (02/19/18 1330)  . vancomycin 150 mL/hr at 02/19/18 1318     LOS: 23 days     Georgette Shell, MD [facility]  If 7PM-7AM, please contact night-coverage [email or web address] Password [email or web address] 02/19/2018, 2:17 PM

## 2018-02-20 ENCOUNTER — Inpatient Hospital Stay (HOSPITAL_COMMUNITY): Payer: Medicare PPO

## 2018-02-20 LAB — BPAM RBC
BLOOD PRODUCT EXPIRATION DATE: 201909282359
ISSUE DATE / TIME: 201909141157
UNIT TYPE AND RH: 7300

## 2018-02-20 LAB — COMPREHENSIVE METABOLIC PANEL
ALT: 23 U/L (ref 0–44)
ANION GAP: 10 (ref 5–15)
AST: 27 U/L (ref 15–41)
Albumin: 1.7 g/dL — ABNORMAL LOW (ref 3.5–5.0)
Alkaline Phosphatase: 103 U/L (ref 38–126)
BUN: 11 mg/dL (ref 8–23)
CHLORIDE: 97 mmol/L — AB (ref 98–111)
CO2: 38 mmol/L — AB (ref 22–32)
Calcium: 8.2 mg/dL — ABNORMAL LOW (ref 8.9–10.3)
Creatinine, Ser: 0.41 mg/dL — ABNORMAL LOW (ref 0.44–1.00)
Glucose, Bld: 112 mg/dL — ABNORMAL HIGH (ref 70–99)
POTASSIUM: 2.7 mmol/L — AB (ref 3.5–5.1)
SODIUM: 145 mmol/L (ref 135–145)
Total Bilirubin: 0.7 mg/dL (ref 0.3–1.2)
Total Protein: 6.5 g/dL (ref 6.5–8.1)

## 2018-02-20 LAB — CBC WITH DIFFERENTIAL/PLATELET
BASOS PCT: 0 %
Basophils Absolute: 0 10*3/uL (ref 0.0–0.1)
EOS ABS: 0.6 10*3/uL (ref 0.0–0.7)
EOS PCT: 6 %
HCT: 27.3 % — ABNORMAL LOW (ref 36.0–46.0)
Hemoglobin: 8.7 g/dL — ABNORMAL LOW (ref 12.0–15.0)
LYMPHS ABS: 1.4 10*3/uL (ref 0.7–4.0)
Lymphocytes Relative: 15 %
MCH: 25.6 pg — ABNORMAL LOW (ref 26.0–34.0)
MCHC: 31.9 g/dL (ref 30.0–36.0)
MCV: 80.3 fL (ref 78.0–100.0)
MONO ABS: 0.8 10*3/uL (ref 0.1–1.0)
Monocytes Relative: 9 %
NEUTROS PCT: 70 %
Neutro Abs: 6.4 10*3/uL (ref 1.7–7.7)
PLATELETS: 332 10*3/uL (ref 150–400)
RBC: 3.4 MIL/uL — ABNORMAL LOW (ref 3.87–5.11)
RDW: 21.8 % — AB (ref 11.5–15.5)
WBC: 9.2 10*3/uL (ref 4.0–10.5)

## 2018-02-20 LAB — GLUCOSE, CAPILLARY
GLUCOSE-CAPILLARY: 97 mg/dL (ref 70–99)
GLUCOSE-CAPILLARY: 97 mg/dL (ref 70–99)
GLUCOSE-CAPILLARY: 121 mg/dL — AB (ref 70–99)
GLUCOSE-CAPILLARY: 142 mg/dL — AB (ref 70–99)
GLUCOSE-CAPILLARY: 139 mg/dL — AB (ref 70–99)
Glucose-Capillary: 127 mg/dL — ABNORMAL HIGH (ref 70–99)
Glucose-Capillary: 90 mg/dL (ref 70–99)

## 2018-02-20 LAB — TYPE AND SCREEN
ABO/RH(D): B POS
Antibody Screen: NEGATIVE
UNIT DIVISION: 0

## 2018-02-20 LAB — MAGNESIUM: Magnesium: 1.8 mg/dL (ref 1.7–2.4)

## 2018-02-20 MED ORDER — POTASSIUM CHLORIDE 10 MEQ/100ML IV SOLN: 10.0000 meq | INTRAVENOUS | Status: DC

## 2018-02-20 MED ORDER — MAGNESIUM SULFATE 2 GM/50ML IV SOLN
2.0000 g | Freq: Once | INTRAVENOUS | Status: AC
  Administered 2018-02-20: 2 g via INTRAVENOUS
  Filled 2018-02-20: qty 50

## 2018-02-20 MED ORDER — POTASSIUM CHLORIDE 20 MEQ/15ML (10%) PO SOLN
30.0000 meq | Freq: Two times a day (BID) | ORAL | Status: DC
  Administered 2018-02-20 – 2018-02-24 (×8): 30 meq
  Filled 2018-02-20 (×9): qty 30

## 2018-02-20 MED ORDER — POTASSIUM CHLORIDE 10 MEQ/100ML IV SOLN: 10.0000 meq | Freq: Every day | INTRAVENOUS | Status: DC

## 2018-02-20 MED ORDER — POTASSIUM CHLORIDE 10 MEQ/100ML IV SOLN
10.0000 meq | INTRAVENOUS | Status: AC
  Administered 2018-02-20 (×5): 10 meq via INTRAVENOUS
  Filled 2018-02-20 (×5): qty 100

## 2018-02-20 NOTE — Progress Notes (Signed)
20 Days Post-Op Procedure(s) (LRB): EXPLORATORY LAPAROTOMY, ABDOMINAL WASHOUT (N/A)  Subjective: Alert, responsive.    Objective: Vital signs in last 24 hours: Temp:  [100 F (37.8 C)-101.5 F (38.6 C)] 100 F (37.8 C) (09/15 1000) Pulse Rate:  [59-124] 95 (09/15 1000) Resp:  [0-32] 16 (09/15 1000) BP: (91-177)/(34-130) 130/102 (09/15 1000) SpO2:  [96 %-100 %] 96 % (09/15 1000) FiO2 (%):  [28 %-30 %] 28 % (09/15 0913) Weight:  [60.1 kg] 60.1 kg (09/15 0451) Last BM Date: 02/20/18  Intake/Output from previous day: 09/14 0701 - 09/15 0700 In: 3057.7 [I.V.:184.8; Blood:315; NG/GT:1416.6; IV Piggyback:1141.2] Out: 2376 [Urine:2150; Drains:10; Stool:375]  Physical Examination: General: Alert GI: wound mostly granulation tissue; focus of fibrinous exudate at the inferior margin--sharply debrided; drain contents cloudy, slightly blood-tinged, scant particulate matter  Extremities: extremities normal, atraumatic, no cyanosis or edema   Labs: WBC/Hgb/Hct/Plts:  9.2/8.7/27.3/332 (09/15 0416) BUN/Cr/glu/ALT/AST/amyl/lip:  11/0.41/--/23/27/--/-- (09/15 0416)   Assessment:  72 y.o. s/p Procedure(s): OMENTECTOMY RADICAL DEBULKING EXPLORATORY LAPAROTOMY WITH REMOVAL OF BILATERAL TUBES AND OVARIES  EXPLORATORY LAPAROTOMY, ABDOMINAL WASHOUT  Pain:  Pain is well-controlled   Heme: Severe anemia, stable  Pulm:  S/P tracheostomy; tolerating ATC.  Critical care notes appreciated  CV: Labile B/P  ID: Intraabdominal collection/abscess; continues to have febrile episodes   GI:  Undergoing tube feeds    FEN: Electrolyte/metabolic derangements  Renal: Creatinine in range  Prophylaxis: pharmacologic prophylaxis (with any of the following: enoxaparin (Lovenox) 40mg  SQ 2 hours prior to surgery then every day) and intermittent pneumatic compression boots.  Plan: >continue to monitor drainage of the intraabdominal process >continue current antibiotic therapy per ID >continue wound  care >continue management per Critical care  LOS: 24 days    Lahoma Crocker 02/20/2018, 11:05 AM

## 2018-02-20 NOTE — Progress Notes (Signed)
Pt weaned on 28% trach collar for 3 hours.  Per CCM, placed back on vent after 3 hour wean.  Pt tolerated well, no complications.

## 2018-02-20 NOTE — Progress Notes (Signed)
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  She is completed 3 cycles of chemotherapy and is now status post bilateral salpingo-oophorectomy and omentectomy with debulking on 8/22.   8/26 transferred to ICU w/ concern for perforation w/ free air noted.  Imipenem started. Went for exploratory laparotomy with washout.  No significant contamination identified, presumed spontaneously closed microperf. Returned with abdominal drains postoperative chest x-ray showing evidence of aspiration pneumonia; mixed respiratory and metabolic acidosis & in septic shock. Required intubation.   8/27 GI medicine consulted, did not feel she had overt GIB.  Rec's prophylaxis with PPI.  Signed off.  Weaning pressors. Hgb dropped from 8.8 to 7.4; got 2 units of blood,  febrile, added. vancomycin.  Neupogen given for neutropenia.   8/28: Pressors off/shock resolved. NAG acidosis and Lactic acidosis resolved.  FiO2/PEEP down, attempted PSV.  WBC count up.  Started TPN.   8/29: FiO2 and PEEP requirements increased, had wean these down to 40/5 now 60/10.  Chest x-ray with worsening right-sided airspace disease likely reflecting de-recruitment. Delirium remains significant obstacle.  Beta-blocker, and clonidine patch added for hypertension   8/30: Diuresed 4.5 L the day prior with Lasix.  Little more awake.  X-ray improved.  Vancomycin discontinued.  Continuing diuresis, RASS goal changed to -1, dropping PEEP to 8.  9/01:  Vanco added back with fevers, clonidine patch  9/02-9/6: Delirium. Sedation difficult, diuresing, remains on abx. spiking temps, loculated fluid collection identified in left pelvis. Not amedable to drainage.  ID consulted. Started seroquel. 9/4. Upper 2 drains  removed. Staples removed.from incision site.  9/4: Infectious disease.  Consulted by family.  Had no additional rec's  9/6: WBC curve normalized but still having low gd fever and purulent drainage from wound. CT shows abd fluid collection better  9/7-9/8: Failing pressure support weans.  9/9 febrile 101F. WBC ok. Not on pressors. Not on sedation gtt. Husband deferring decisions about care to son (newly married). Suspicion for per > CT ABD with decrease in fluid collection   9/10  Sister at bedside - says she is not in favor of trach but says is the decision of son/dpoa. Son met with Dr Alvy Bimler for goals of care - current status precludes chemo but goal is trach and full code and full medical care and later chemo when ECOG improves.  Low grade fever persists - ID following. More awake today. No Perf leak on CT abd and restarted TF  9/11 Ongoing fever.  Failed SBT.  Pending trach 9/12  9/12- s/p Trach - Dr Nelda Marseille  9/13 - s.p trach. Running fevers 101.F. K, Mag and phos - repleted. BAck on levophed 12mg. Doing SBT   9/14 - stil febrile 101F though wbc ok. S/p 1 unit PRBC Not on pressors.  Interacting with normal mental status. BAck pn PSV. Overnight on PRVC. RT wants to try ATC   Subjective / interval events   02/20/2018 - on ATC now for first time; tolreating well. Did PRVC overnight  Objective    Blood pressure (!) 165/76, pulse (!) 120, temperature (!) 101.1 F (38.4 C), resp. rate (!) 24, height _0  (1.6 m), weight 60.1 kg, SpO2 97 %.    Vent Mode: PRVC FiO2 (%):  [28 %-30 %] 28 % Set  Rate:  [20 bmp] 20 bmp Vt Set:  [330 mL] 330 mL PEEP:  [5 cmH20] 5 cmH20 Pressure Support:  [10 cmH20] 10 cmH20 Plateau Pressure:  [13 cmH20-16 cmH20] 13 cmH20   Intake/Output Summary (Last 24 hours) at 02/20/2018 0924 Last data filed at 02/20/2018 0800 Gross per 24 hour  Intake 3330.56 ml  Output 2685 ml  Net 645.56 ml   Filed Weights   02/18/18 0436 02/19/18 0544 02/20/18 0451  Weight:  62.1 kg 59 kg 60.1 kg     EXAM  General Appearance:  Deconditioned. Looking better Head:  Normocephalic, without obvious abnormality, atraumatic Eyes:  PERRL - yes, conjunctiva/corneas - clear     Ears:  Normal external ear canals, both ears Nose:  G tube - yes Throat:  ETT TUBE - NO . TRACH +. On ATC + , OG tube - no Neck:  Supple,  No enlargement/tenderness/nodules Lungs: Clear to auscultation bilaterally,  Heart:  S1 and S2 normal, no murmur, CVP - no.  Pressors - no Abdomen:  Soft, no masses, no organomegaly Genitalia / Rectal:  Not done Extremities:  Extremities- intact Skin:  ntact in exposed areas .  Neurologic:  Sedation - none -> RASS - +1 . Moves all 4s - yes. CAM-ICU - neg . Orientation - x3+         Labs    PULMONARY No results for input(s): PHART, PCO2ART, PO2ART, HCO3, TCO2, O2SAT in the last 168 hours.  Invalid input(s): PCO2, PO2  CBC Recent Labs  Lab 02/18/18 0414 02/19/18 0548 02/19/18 1836 02/20/18 0416  HGB 7.4* 6.9* 8.1* 8.7*  HCT 23.7* 21.9* 25.2* 27.3*  WBC 9.4 7.8  --  9.2  PLT 302 307  --  332    COAGULATION Recent Labs  Lab 02/17/18 0500 02/17/18 0838  INR 1.28 1.28   CARDIAC   No results for input(s): TROPONINI in the last 168 hours. No results for input(s): PROBNP in the last 168 hours.  CHEMISTRY Recent Labs  Lab 02/15/18 0037  02/15/18 0554 02/16/18 0455 02/17/18 0500 02/17/18 0838 02/18/18 0414 02/19/18 0548 02/20/18 0416  NA 144  --   --   --   --  143 141 142 145  K 2.7*  --   --   --   --  2.6* 3.0* 2.6* 2.7*  CL 86*  --   --   --   --  87* 91* 92* 97*  CO2 43*  --   --   --   --  45* 42* 40* 38*  GLUCOSE 99  --   --   --   --  133* 226* 113* 112*  BUN 18  --   --   --   --  _0 CREATININE 0.64  --   --   --   --  0.48 0.53 0.45 0.41*  CALCIUM 8.3*  --   --   --   --  8.2* 7.7* 7.7* 8.2*  MG  --    < > 1.3* 1.9 1.7  --  1.8 1.6* 1.8  PHOS  --   --  4.3 2.2* 2.7  --  2.2* 3.3  --    < > = values  in this interval not displayed.   Estimated Creatinine Clearance: 52.6 mL/min (A) (by C-G formula based on SCr of 0.41 mg/dL (L)).  LIVER Recent Labs  Lab 02/17/18 0500 02/17/18 0838 02/20/18 0416  AST  --   --  27  ALT  --   --  23  ALKPHOS  --   --  103  BILITOT  --   --  0.7  PROT  --   --  6.5  ALBUMIN  --   --  1.7*  INR 1.28 1.28  --    INFECTIOUS No results for input(s): LATICACIDVEN, PROCALCITON in the last 168 hours.  ENDOCRINE CBG (last 3)  Recent Labs    02/20/18 0350 02/20/18 0413 02/20/18 0753  GLUCAP 97 97 121*   IMAGING  Dg Abd 1 View  Result Date: 02/20/2018 CLINICAL DATA:  NG tube EXAM: ABDOMEN - 1 VIEW COMPARISON:  02/20/2012 FINDINGS: Enteric tube terminates in the mid gastric body. Nonobstructive bowel gas pattern. Lung bases are essentially clear. IMPRESSION: Enteric tube terminates in the mid gastric body. Electronically Signed   By: Julian Hy M.D.   On: 02/20/2018 07:21   Dg Abd 1 View  Result Date: 02/19/2018 CLINICAL DATA:  Repositioning enteric tube EXAM: ABDOMEN - 1 VIEW COMPARISON:  Abdominal radiograph 02/19/2018 FINDINGS: Enteric tube tip and side-port project over the stomach. Mildly gaseous distended loops of bowel throughout the abdomen. Small layering bilateral pleural effusions. IMPRESSION: Enteric tube tip and side-port project over the stomach. Electronically Signed   By: Lovey Newcomer M.D.   On: 02/19/2018 19:24   Dg Abd 1 View  Result Date: 02/19/2018 CLINICAL DATA:  NG tube placement. EXAM: ABDOMEN - 1 VIEW COMPARISON:  Radiograph 02/17/2018 FINDINGS: Tip and side port of the enteric tube below the diaphragm in the stomach. No bowel dilatation to suggest obstruction. Retrocardiac opacity. IMPRESSION: Tip and side port of the enteric tube below the diaphragm in the stomach. Electronically Signed   By: Keith Rake M.D.   On: 02/19/2018 05:35   Micro Data: Blood Culture x2 8/26 >> neg Sputum 8/27: Abundant yeast, few Candida  albicans Blood Cx 9/1 >> negative Respiratory 9/1 >> few Candida Urine 9/1 >> negative Peritoneal fluid 8/31 >> neg Abd Drainage 9/5 >> few lactobacillus  JP Drainage 9/8 >> E-Coli C-Diff 9/12 >>  Antimicrobials:  Meropenem 8/26 >> 9/11 Vancomycin 8/27 >> 8/30; 9/1 >> 9/11 Eraxis 8/27 >>  Unasyn 9/11 >>   Studies: CT abdomen 8/26: Hemic bowel with portal venous gas and bowel pneumatosis with large amount of pneumoperitoneum.  Unclear as to site of perforation.  Multiple pockets of air adjacent to the sigmoid colon.  Enhancement of the peritoneum concerning for peritonitis.  There was aortic arterial sclerosis.  Echocardiogram 8/27: Particular ejection fraction 65 to 70%.  She has moderate LVH, and hyperdynamic LV function.  There is grade 1 diastolic dysfunction.  Trivial MR.  Normal left atrial size.  The IV C was not visualized  CT abdomen 9/2 >> drainage catheters within the pelvis, RUQ, LUQ.  Significant interval decrease in size of previously described intra-abdominal fluid, no residual free intraperitoneal air. There is a persistent loculated fluid & gas collection along the left pelvic sidewall measuring up to 10 cm.  Moderate bilateral pleural effusions, underlying opacities favored to represent atelectasis.  Marked wall thickening of the sigmoid colon.  Peripheral low attenuation within the spleen concerning for splenic infarcts.  Wall thickening of the urinary bladder.   CT abdomen 9/6: Improving left lateral pelvic fluid collection, resolved peritoneal fluid in the left lower abdomen.  Concentric thickening of the sigmoid colon.  Decreased bilateral effusion and atelectasis.  CT ABD 9/9 Similar to slight decrease in size of ill-defined complex fluid  and gas in the lateral left hemipelvis. 2. No evidence of contrast communication between the thickened sigmoid and this pelvic sidewall process. 3. Foley catheter and air within theurinary bladder, which is not decompressed. Correlate  with catheter function.   Assessment & Plan:   Acute on Chronic hypoxemic respiratory failure in the setting of aspiration pneumonia / ARDS.   Right pleural effusion- No sig pocket to drain on bedside US 9/5. S.p trach 02/17/18   02/20/2018 - Started ATC. Doing well so fare   P: ATC for few hours this moring 02/20/18  and then PSV Overnight PRVC LTAC - needed    Status post exploratory laparotomy for free air on 8/26 ; following bilateral salpingo-oophorectomy, omentectomy, and debulking surgery on 8/22. Developed fluid collections not amenable to IR drainage.  Reducing in size on CT.P: P: Recommendations per GYN/ONC Follow JP drainage    Severe Sepsis - with neutropenia in setting of peritonitis (microperforation), complicated by aspiration pneumonia.   02/20/2018 - ongoing fevers. Off pressors  PLAN Antibiotics per ID     Sinus tachycardia Hypertension Grade 1 diastolic dysfunction  Elevated Troponin - initially, resolved.  Likely demand Bradycardia - periods of brady to the 30's Labile BP - w/episodes of hypotension seemingly exacerbated by sedation P: pe TRiad  Acute metabolic encephalopathy - in setting of critical illness, sedation  02/18/18 - appears resolved and still resolved as of 02/20/2018   P: RASS Goal: 0 to -1  Minimize sedating medications as able PRN versed, PRN fentanyl  Seroquel, paxil  - TRH to wean  Electrolyte Disturbances -    P: Replete K and mag 02/20/18 Per TRH Avoid nephrotoxic agents, ensure adequate renal perfusion  Severe Protein Calorie Malnutrition, prealbumin 3.5 P: Continue TF  PPI for stress ulcer prophylaxis    #Heme Postoperative anemia.   Recent Labs  Lab 02/18/18 0414 02/19/18 0548 02/19/18 1836 02/20/18 0416  HGB 7.4* 6.9* 8.1* 8.7*  HCT 23.7* 21.9* 25.2* 27.3*  WBC 9.4 7.8  --  9.2  PLT 302 307  --  332    S/p 1 unit PRBC 02/19/18  P: Per [facility] - PRBC for hgb </= 6.9gm%    - exceptions are   -  if  ACS susepcted/confirmed then transfuse for hgb </= 8.0gm%,  or    -  active bleeding with hemodynamic instability, then transfuse regardless of hemoglobin value   At at all times try to transfuse 1 unit prbc as possible with exception of active hemorrhage    Hyperglycemia P: SSI     Best practice/Goals of care  DVT prophylaxis: LMWH GI prophylaxis: PPI Diet: TF  Mobility: BR Code Status: Full Code Family Communication:  Son updated 02/20/2018   Disposition / Summary of Today's Plan 02/20/2018   Aim for ATC few hours . Otherwise PSV in day an dPRVC at night. LTAC needed. Medical mgmt by [facility]. Primary is GYN   SIGNATURE    Dr. Brand Males, M.D., F.C.C.P,  Pulmonary and Critical Care Medicine Staff Physician, [place] Director - Interstitial Lung Disease  Program  P[facility] [place], [place], [address]  Pager: [phone], If no answer or between  15:00h - 7:00h: call [phone] Telephone: [phone]  9:24 AM 02/20/2018

## 2018-02-20 NOTE — Progress Notes (Signed)
CRITICAL VALUE ALERT  Critical Value:  Potassium 2.7 Date & Time Notied:  0529 02/20/18   Provider Notified: Schorr via text page  Orders Received/Actions taken: awaiting orders

## 2018-02-20 NOTE — Progress Notes (Signed)
PROGRESS NOTE    Jo Parker  UMP:[id number] DOB: 1945/10/17 DOA: 01/27/2018 PCP: [name], MD  Brief Narrative:72 year old African-American female admitted 8/ 22 2019 under GYN service with a diagnosis of ovarian adenocarcinoma status post chemotherapy and bilateral salpingo-oophorectomy and omentectomy with debulking on 01/27/18.  On 88 CT scan concerning for perforation with free air started on imipenem went for exploratory laparotomy with mesh washout no significant evidence of bowel perforation was noted patient was intubated.  Patient failed extubation trials and she had a trach placed on 02/17/2018.  She was seen by GI for anemia recommended PPI no GI evaluation was done.  Patient continues to be febrile with a normal white count pressors weaned off.  TR[facility] called back to consult 02/19/2018.   Assessment & Plan:   Principal Problem:   Pelvic abscess in female Active Problems:   Primary peritoneal carcinomatosis ([place])   Severe protein-calorie malnutrition ([place])   Severe sepsis ([name])   COPD (chronic obstructive pulmonary disease) ([name])   HLD (hyperlipidemia)   Absolute anemia   White coat syndrome with hypertension   Ovarian cancer ([name])   AKI (acute kidney injury) ([place])   Acute metabolic encephalopathy   Acute respiratory distress   Low urine output   Somnolence   At risk for fluid volume overload   Free intraperitoneal air   Acute on chronic respiratory failure ([name])   Pressure injury of skin   Status post bilateral salpingo-oophorectomy   Peritonitis ([place])   HCAP (healthcare-associated pneumonia)   Postoperative wound infection   Aspiration pneumonia ([place])  #1 aspiration pneumonia/ARDS status post trach 02/17/2018--ON LASIX 40 BID.DEFER TO PCCM ?DOSE ADJUSTMENT.CHECK CXR. #2 status post exploratory laparotomy and bilateral salpingo-oophorectomy omentectomy and debulking developed fluid collections now with JP drain in place #3 sepsis with aspiration pneumonia ARDS  and peritonitis secondary to microperforation with ongoing fever normal white count off pressors today 02/20/2018 #4 hypertension-on lasix #5 previous of bradycardia #6 severe anemia postop anemia transfuse 1 unit packed RBCs 9/14 #7 hypokalemia and hypomagnesemia being repleted ON LASIX BID.ADD KCL. DVT prophylaxis:lovenox Code Status: full Family Communication:dw son Disposition Plan LTAC  Antimicrobials:UNASYN ERAXIS VANCO        Subjective:STILL spiking fever   Objective: Vitals:   02/20/18 1136 02/20/18 1200 02/20/18 1240 02/20/18 1300  BP: (!) 145/64 (!) 145/59  (!) 153/79  Pulse: (!) 106 (!) 111  (!) 110  Resp: (!) 26 (!) 29  17  Temp:  99.9 F (37.7 C)  99.9 F (37.7 C)  TempSrc:      SpO2: 96% 98% 97% 100%  Weight:      Height:        Intake/Output Summary (Last 24 hours) at 02/20/2018 1425 Last data filed at 02/20/2018 1318 Gross per 24 hour  Intake 2833.72 ml  Output 3935 ml  Net -1101.28 ml   Filed Weights   02/18/18 0436 02/19/18 0544 02/20/18 0451  Weight: 62.1 kg 59 kg 60.1 kg    Examination: SLEEPIER BUT EASILY AROUSABLE,TRYING TO SMILE.SON BY BED SIDE. General exam: Appears calm and comfortable  Respiratory system: Clear to auscultation. Respiratory effort normal. Cardiovascular system: S1 & S2 heard, RRR. No JVD, murmurs, rubs, gallops or clicks. No pedal edema. Gastrointestinal system: Abdomen is distended, soft and tender. No organomegaly or masses felt. Normal bowel sounds heard. Extremities: NO EDEMA Skin: No rashes, lesions or ulcers     Data Reviewed: I have personally reviewed following labs and imaging studies  CBC: Recent Labs  Lab 02/16/18  6440 02/17/18 0500 02/18/18 0414 02/19/18 0548 02/19/18 1836 02/20/18 0416  WBC 6.7 9.2 9.4 7.8  --  9.2  NEUTROABS 4.8 7.6 6.8 5.2  --  6.4  HGB 8.0* 8.6* 7.4* 6.9* 8.1* 8.7*  HCT 25.6* 27.3* 23.7* 21.9* 25.2* 27.3*  MCV 81.5 81.7 81.2 81.1  --  80.3  PLT 275 313 302 307  --  347    Basic Metabolic Panel: Recent Labs  Lab 02/15/18 0037  02/15/18 0554 02/16/18 0455 02/17/18 0500 02/17/18 0838 02/18/18 0414 02/19/18 0548 02/20/18 0416  NA 144  --   --   --   --  143 141 142 145  K 2.7*  --   --   --   --  2.6* 3.0* 2.6* 2.7*  CL 86*  --   --   --   --  87* 91* 92* 97*  CO2 43*  --   --   --   --  45* 42* 40* 38*  GLUCOSE 99  --   --   --   --  133* 226* 113* 112*  BUN 18  --   --   --   --  _0 CREATININE 0.64  --   --   --   --  0.48 0.53 0.45 0.41*  CALCIUM 8.3*  --   --   --   --  8.2* 7.7* 7.7* 8.2*  MG  --    < > 1.3* 1.9 1.7  --  1.8 1.6* 1.8  PHOS  --   --  4.3 2.2* 2.7  --  2.2* 3.3  --    < > = values in this interval not displayed.   GFR: Estimated Creatinine Clearance: 52.6 mL/min (A) (by C-G formula based on SCr of 0.41 mg/dL (L)). Liver Function Tests: Recent Labs  Lab 02/20/18 0416  AST 27  ALT 23  ALKPHOS 103  BILITOT 0.7  PROT 6.5  ALBUMIN 1.7*   No results for input(s): LIPASE, AMYLASE in the last 168 hours. No results for input(s): AMMONIA in the last 168 hours. Coagulation Profile: Recent Labs  Lab 02/17/18 0500 02/17/18 0838  INR 1.28 1.28   Cardiac Enzymes: No results for input(s): CKTOTAL, CKMB, CKMBINDEX, TROPONINI in the last 168 hours. BNP (last 3 results) No results for input(s): PROBNP in the last 8760 hours. HbA1C: No results for input(s): HGBA1C in the last 72 hours. CBG: Recent Labs  Lab 02/19/18 2350 02/20/18 0350 02/20/18 0413 02/20/18 0753 02/20/18 1205  GLUCAP 129* 97 97 121* 127*   Lipid Profile: No results for input(s): CHOL, HDL, LDLCALC, TRIG, CHOLHDL, LDLDIRECT in the last 72 hours. Thyroid Function Tests: No results for input(s): TSH, T4TOTAL, FREET4, T3FREE, THYROIDAB in the last 72 hours. Anemia Panel: No results for input(s): VITAMINB12, FOLATE, FERRITIN, TIBC, IRON, RETICCTPCT in the last 72 hours. Sepsis Labs: No results for input(s): PROCALCITON, LATICACIDVEN in the last  168 hours.  Recent Results (from the past 240 hour(s))  Aerobic/Anaerobic Culture (surgical/deep wound)     Status: None   Collection Time: 02/13/18 11:44 AM  Result Value Ref Range Status   Specimen Description JP DRAINAGE  Final   Special Requests NONE  Final   Gram Stain   Final    FEW WBC PRESENT, PREDOMINANTLY PMN MODERATE GRAM NEGATIVE RODS FEW GRAM POSITIVE RODS Performed at [facility], [address] [place], [place] [address]    Culture   Final  RARE ESCHERICHIA COLI MODERATE LACTOBACILLUS SPECIES Standardized susceptibility testing for this organism is not available. MIXED ANAEROBIC FLORA PRESENT.  CALL LAB IF FURTHER IID REQUIRED.    Report Status 02/18/2018 FINAL  Final   Organism ID, Bacteria ESCHERICHIA COLI  Final      Susceptibility   Escherichia coli - MIC*    AMPICILLIN 4 SENSITIVE Sensitive     CEFAZOLIN <=4 SENSITIVE Sensitive     CEFEPIME <=1 SENSITIVE Sensitive     CEFTAZIDIME <=1 SENSITIVE Sensitive     CEFTRIAXONE <=1 SENSITIVE Sensitive     CIPROFLOXACIN <=0.25 SENSITIVE Sensitive     GENTAMICIN <=1 SENSITIVE Sensitive     IMIPENEM <=0.25 SENSITIVE Sensitive     TRIMETH/SULFA <=20 SENSITIVE Sensitive     AMPICILLIN/SULBACTAM <=2 SENSITIVE Sensitive     PIP/TAZO <=4 SENSITIVE Sensitive     Extended ESBL NEGATIVE Sensitive     * RARE ESCHERICHIA COLI  Culture, Urine     Status: None   Collection Time: 02/17/18 12:45 PM  Result Value Ref Range Status   Specimen Description   Final    URINE, CATHETERIZED Performed at [facility] [address], [place], [place] [address]    Special Requests   Final    Immunocompromised Performed at [facility], [place] [address], [place], [place] [address]    Culture   Final    NO GROWTH Performed at [facility], [place] [address], [place], [place] [address]    Report Status 02/18/2018 FINAL  Final  Culture, blood (routine x 2)     Status: None (Preliminary  result)   Collection Time: 02/17/18  1:12 PM  Result Value Ref Range Status   Specimen Description   Final    BLOOD RIGHT ANTECUBITAL Performed at [facility] [address], [place], [place] [address]    Special Requests   Final    BOTTLES DRAWN AEROBIC ONLY Blood Culture adequate volume Performed at [facility] [address] [place], [place] [address]    Culture   Final    NO GROWTH 2 DAYS Performed at [facility] [address], [place], [place] [address]    Report Status PENDING  Incomplete  Culture, blood (routine x 2)     Status: None (Preliminary result)   Collection Time: 02/17/18  1:14 PM  Result Value Ref Range Status   Specimen Description   Final    BLOOD RIGHT ANTECUBITAL Performed at [facility] [address], [place], [place] [address]    Special Requests   Final    BOTTLES DRAWN AEROBIC ONLY Blood Culture adequate volume Performed at [facility] [address], [place], [place] [address]    Culture   Final    NO GROWTH 2 DAYS Performed at [facility] [address], [place], [place] [address]    Report Status PENDING  Incomplete  C difficile quick scan w PCR reflex     Status: None   Collection Time: 02/17/18  3:08 PM  Result Value Ref Range Status   C Diff antigen NEGATIVE NEGATIVE Final   C Diff toxin NEGATIVE NEGATIVE Final   C Diff interpretation No C. difficile detected.  Final    Comment: Performed at [facility], [place] [address], [name], [place] [address]         Radiology Studies: Dg Abd 1 View  Result Date: 02/20/2018 CLINICAL DATA:  NG tube EXAM: ABDOMEN - 1 VIEW COMPARISON:  02/20/2012 FINDINGS: Enteric tube terminates in the mid gastric body. Nonobstructive bowel gas pattern. Lung bases are essentially clear. IMPRESSION: Enteric tube terminates in the mid gastric body. Electronically Signed   By: Julian Hy M.D.    On: 02/20/2018 07:21   Dg Abd 1 View  Result Date: 02/19/2018 CLINICAL DATA:  Repositioning enteric tube EXAM: ABDOMEN - 1 VIEW COMPARISON:  Abdominal radiograph 02/19/2018 FINDINGS: Enteric tube tip and side-port project over the stomach. Mildly gaseous distended loops of bowel throughout the abdomen. Small layering bilateral pleural effusions. IMPRESSION: Enteric tube tip and side-port project over the stomach. Electronically Signed   By: Lovey Newcomer M.D.   On: 02/19/2018 19:24   Dg Abd 1 View  Result Date: 02/19/2018 CLINICAL DATA:  NG tube placement. EXAM: ABDOMEN - 1 VIEW COMPARISON:  Radiograph 02/17/2018 FINDINGS: Tip and side port of the enteric tube below the diaphragm in the stomach. No bowel dilatation to suggest obstruction. Retrocardiac opacity. IMPRESSION: Tip and side port of the enteric tube below the diaphragm in the stomach. Electronically Signed   By: Keith Rake M.D.   On: 02/19/2018 05:35        Scheduled Meds: . chlorhexidine gluconate (MEDLINE KIT)  15 mL Mouth Rinse BID  . citalopram  20 mg Per Tube Daily  . enoxaparin (LOVENOX) injection  40 mg Subcutaneous Q24H  . free water  100 mL Per Tube Q8H  . furosemide  40 mg Intravenous Q12H  . insulin aspart  0-20 Units Subcutaneous Q4H  . insulin aspart  4 Units Subcutaneous Q4H  . ipratropium-albuterol  3 mL Nebulization TID  . mouth rinse  15 mL Mouth Rinse 10 times per day  . pantoprazole sodium  40 mg Per Tube BID  . QUEtiapine  25 mg Per Tube Daily  . QUEtiapine  50 mg Oral QHS  . sodium chloride flush  10-40 mL Intracatheter Q12H   Continuous Infusions: . sodium chloride Stopped (02/15/18 0951)  . sodium chloride Stopped (02/20/18 1218)  . ampicillin-sulbactam (UNASYN) IV Stopped (02/20/18 1250)  . anidulafungin Stopped (02/20/18 1129)  . dextrose Stopped (02/18/18 0951)  . feeding supplement (VITAL AF 1.2 CAL) 1,000 mL (02/20/18 1356)  . vancomycin Stopped (02/20/18 1318)     LOS: 24 days       Georgette Shell, MD [facility]  If 7PM-7AM, please contact night-coverage [email or web address] Password [email or web address] 02/20/2018, 2:25 PM

## 2018-02-21 ENCOUNTER — Inpatient Hospital Stay (HOSPITAL_COMMUNITY): Payer: Medicare PPO

## 2018-02-21 DIAGNOSIS — J9622 Acute and chronic respiratory failure with hypercapnia: Secondary | ICD-10-CM

## 2018-02-21 DIAGNOSIS — J9621 Acute and chronic respiratory failure with hypoxia: Secondary | ICD-10-CM

## 2018-02-21 DIAGNOSIS — E43 Unspecified severe protein-calorie malnutrition: Secondary | ICD-10-CM

## 2018-02-21 LAB — CBC WITH DIFFERENTIAL/PLATELET
BASOS PCT: 0 %
Basophils Absolute: 0 10*3/uL (ref 0.0–0.1)
EOS PCT: 4 %
Eosinophils Absolute: 0.3 10*3/uL (ref 0.0–0.7)
HCT: 27 % — ABNORMAL LOW (ref 36.0–46.0)
HEMOGLOBIN: 8.5 g/dL — AB (ref 12.0–15.0)
LYMPHS PCT: 13 %
Lymphs Abs: 1.1 10*3/uL (ref 0.7–4.0)
MCH: 25.4 pg — AB (ref 26.0–34.0)
MCHC: 31.5 g/dL (ref 30.0–36.0)
MCV: 80.8 fL (ref 78.0–100.0)
Monocytes Absolute: 0.6 10*3/uL (ref 0.1–1.0)
Monocytes Relative: 7 %
NEUTROS PCT: 76 %
Neutro Abs: 6.2 10*3/uL (ref 1.7–7.7)
Platelets: 346 10*3/uL (ref 150–400)
RBC: 3.34 MIL/uL — AB (ref 3.87–5.11)
RDW: 21.8 % — ABNORMAL HIGH (ref 11.5–15.5)
WBC: 8.2 10*3/uL (ref 4.0–10.5)

## 2018-02-21 LAB — GLUCOSE, CAPILLARY
GLUCOSE-CAPILLARY: 108 mg/dL — AB (ref 70–99)
Glucose-Capillary: 136 mg/dL — ABNORMAL HIGH (ref 70–99)
Glucose-Capillary: 128 mg/dL — ABNORMAL HIGH (ref 70–99)
Glucose-Capillary: 163 mg/dL — ABNORMAL HIGH (ref 70–99)
Glucose-Capillary: 79 mg/dL (ref 70–99)

## 2018-02-21 LAB — VANCOMYCIN, PEAK: VANCOMYCIN PK: 140 ug/mL — AB (ref 30–40)

## 2018-02-21 LAB — COMPREHENSIVE METABOLIC PANEL
ALK PHOS: 89 U/L (ref 38–126)
ALT: 21 U/L (ref 0–44)
AST: 27 U/L (ref 15–41)
Albumin: 1.6 g/dL — ABNORMAL LOW (ref 3.5–5.0)
Anion gap: 8 (ref 5–15)
BUN: 11 mg/dL (ref 8–23)
CO2: 37 mmol/L — ABNORMAL HIGH (ref 22–32)
CREATININE: 0.54 mg/dL (ref 0.44–1.00)
Calcium: 7.9 mg/dL — ABNORMAL LOW (ref 8.9–10.3)
Chloride: 96 mmol/L — ABNORMAL LOW (ref 98–111)
Glucose, Bld: 151 mg/dL — ABNORMAL HIGH (ref 70–99)
Potassium: 2.8 mmol/L — ABNORMAL LOW (ref 3.5–5.1)
Sodium: 141 mmol/L (ref 135–145)
Total Bilirubin: 0.3 mg/dL (ref 0.3–1.2)
Total Protein: 6.5 g/dL (ref 6.5–8.1)

## 2018-02-21 LAB — PHOSPHORUS: Phosphorus: 3.5 mg/dL (ref 2.5–4.6)

## 2018-02-21 LAB — MAGNESIUM: Magnesium: 1.7 mg/dL (ref 1.7–2.4)

## 2018-02-21 MED ORDER — FUROSEMIDE 10 MG/ML IJ SOLN
40.0000 mg | Freq: Every day | INTRAMUSCULAR | Status: DC
  Administered 2018-02-22 – 2018-02-26 (×5): 40 mg via INTRAVENOUS
  Filled 2018-02-21 (×5): qty 4

## 2018-02-21 MED ORDER — ADULT MULTIVITAMIN W/MINERALS CH
1.0000 | ORAL_TABLET | Freq: Every day | ORAL | Status: DC
  Administered 2018-02-21 – 2018-02-24 (×4): 1 via ORAL
  Filled 2018-02-21 (×4): qty 1

## 2018-02-21 MED ORDER — ONDANSETRON HCL 4 MG/2ML IJ SOLN
4.0000 mg | Freq: Four times a day (QID) | INTRAMUSCULAR | Status: DC | PRN
  Administered 2018-02-21 – 2018-02-22 (×2): 4 mg via INTRAVENOUS
  Filled 2018-02-21 (×3): qty 2

## 2018-02-21 MED ORDER — MAGNESIUM SULFATE 2 GM/50ML IV SOLN
2.0000 g | Freq: Once | INTRAVENOUS | Status: AC
  Administered 2018-02-21: 2 g via INTRAVENOUS
  Filled 2018-02-21: qty 50

## 2018-02-21 MED ORDER — POTASSIUM CHLORIDE 10 MEQ/50ML IV SOLN
10.0000 meq | INTRAVENOUS | Status: AC
  Administered 2018-02-21 (×4): 10 meq via INTRAVENOUS
  Filled 2018-02-21 (×4): qty 50

## 2018-02-21 NOTE — Progress Notes (Addendum)
PROGRESS NOTE    Jo Parker  DJS:[id number] DOB: 02-12-1946 DOA: 01/27/2018 PCP: [name], MD   Brief Narrative 72 year old African-American female admitted 8/22 2019 under GYN service with a diagnosis of ovarian adenocarcinoma status post chemotherapy and bilateral salpingo-oophorectomy and omentectomy with debulking on 01/27/18. On 52 CT scan concerning for perforation with free air started on imipenem went for exploratory laparotomy with mesh washout no significant evidence of bowel perforation was noted patient was intubated. Patient failed extubation trials and she had a trach placed on 02/17/2018. She was seen by GI for anemia recommended PPI no GI evaluation was done. Patient continues to be febrile with a normal white count pressors weaned off. TRH called back to consult 02/19/2018.  Assessment & Plan:   Principal Problem:   Pelvic abscess in female Active Problems:   Primary peritoneal carcinomatosis ([place])   Severe protein-calorie malnutrition ([place])   Severe sepsis ([name])   COPD (chronic obstructive pulmonary disease) ([name])   HLD (hyperlipidemia)   Absolute anemia   White coat syndrome with hypertension   Ovarian cancer ([place])   AKI (acute kidney injury) ([place])   Acute metabolic encephalopathy   Acute respiratory distress   Low urine output   Somnolence   At risk for fluid volume overload   Free intraperitoneal air   Acute on chronic respiratory failure ([name])   Pressure injury of skin   Status post bilateral salpingo-oophorectomy   Peritonitis ([place])   HCAP (healthcare-associated pneumonia)   Postoperative wound infection   Aspiration pneumonia ([place])  #1 aspiration pneumonia/ARDS status post trach 02/17/2018.chest x-ray 8 02/20/2018 hazy opacification of the right lung and left base.  Lasix decreased to 40 mg daily.  UB today esophageal tube tip in the left upper quadrant over the proximal stomach no change in left lung base consolidation dilated air-filled bowel  in the upper abdomen #2 status post exploratory laparotomy and bilateral salpingo-oophorectomy omentectomy and debulking developed fluid collections now withJP drainin place #3 sepsis with aspiration pneumonia ARDS and peritonitis secondary to microperforation with ongoing fever normal white count off pressors today 02/20/2018 #4 hypertension-on lasix #5 previous of bradycardia #6 severe anemia postop anemia transfuse 1 unit packed RBCs 9/14 #7 hypokalemia and hypomagnesemia being repleted ON LASIX BID.ADD KCL  DVT prophylaxis Lovenox Code Status: Full code Family Communication discussed with husband Disposition Plan: TBD  Subjective: Resting in bed eyes open trying to smile  Objective: Vitals:   02/21/18 1218 02/21/18 1230 02/21/18 1300 02/21/18 1400  BP: (!) 166/94  (!) 139/114 (!) 146/81  Pulse: (!) 105  (!) 105 (!) 108  Resp: '16  17 17  '$ Temp:  (!) 100.6 F (38.1 C)    TempSrc:  Oral    SpO2: 99%  96% 97%  Weight:      Height:        Intake/Output Summary (Last 24 hours) at 02/21/2018 1438 Last data filed at 02/21/2018 1400 Gross per 24 hour  Intake 1631.56 ml  Output 2760 ml  Net -1128.44 ml   Filed Weights   02/19/18 0544 02/20/18 0451 02/21/18 0500  Weight: 59 kg 60.1 kg 60.5 kg    Examination:  General exam: Appears calm and comfortable  Respiratory system: COARSE to auscultation. Respiratory effort normal. Cardiovascular system: S1 & S2 heard, RRR. No JVD, murmurs, rubs, gallops or clicks. No pedal edema. Gastrointestinal system: Abdomen is distended, soft and nontender. No organomegaly or masses felt. Normal bowel sounds heard. Central nervous system: Alert and oriented. No focal neurological deficits.  Extremities: Symmetric 5 x 5 power. Skin: No rashes, lesions or ulcers Psychiatry: Judgement and insight appear normal. Mood & affect appropriate.     Data Reviewed: I have personally reviewed following labs and imaging studies  CBC: Recent Labs  Lab  02/17/18 0500 02/18/18 0414 02/19/18 0548 02/19/18 1836 02/20/18 0416 02/21/18 0323  WBC 9.2 9.4 7.8  --  9.2 8.2  NEUTROABS 7.6 6.8 5.2  --  6.4 6.2  HGB 8.6* 7.4* 6.9* 8.1* 8.7* 8.5*  HCT 27.3* 23.7* 21.9* 25.2* 27.3* 27.0*  MCV 81.7 81.2 81.1  --  80.3 80.8  PLT 313 302 307  --  332 470   Basic Metabolic Panel: Recent Labs  Lab 02/16/18 0455 02/17/18 0500 02/17/18 0838 02/18/18 0414 02/19/18 0548 02/20/18 0416 02/21/18 0323  NA  --   --  143 141 142 145 141  K  --   --  2.6* 3.0* 2.6* 2.7* 2.8*  CL  --   --  87* 91* 92* 97* 96*  CO2  --   --  45* 42* 40* 38* 37*  GLUCOSE  --   --  133* 226* 113* 112* 151*  BUN  --   --  '14 14 11 11 11  '$ CREATININE  --   --  0.48 0.53 0.45 0.41* 0.54  CALCIUM  --   --  8.2* 7.7* 7.7* 8.2* 7.9*  MG 1.9 1.7  --  1.8 1.6* 1.8 1.7  PHOS 2.2* 2.7  --  2.2* 3.3  --  3.5   GFR: Estimated Creatinine Clearance: 52.6 mL/min (by C-G formula based on SCr of 0.54 mg/dL). Liver Function Tests: Recent Labs  Lab 02/20/18 0416 02/21/18 0323  AST 27 27  ALT 23 21  ALKPHOS 103 89  BILITOT 0.7 0.3  PROT 6.5 6.5  ALBUMIN 1.7* 1.6*   No results for input(s): LIPASE, AMYLASE in the last 168 hours. No results for input(s): AMMONIA in the last 168 hours. Coagulation Profile: Recent Labs  Lab 02/17/18 0500 02/17/18 0838  INR 1.28 1.28   Cardiac Enzymes: No results for input(s): CKTOTAL, CKMB, CKMBINDEX, TROPONINI in the last 168 hours. BNP (last 3 results) No results for input(s): PROBNP in the last 8760 hours. HbA1C: No results for input(s): HGBA1C in the last 72 hours. CBG: Recent Labs  Lab 02/20/18 1956 02/20/18 2308 02/21/18 0322 02/21/18 0817 02/21/18 1323  GLUCAP 139* 90 136* 128* 163*   Lipid Profile: No results for input(s): CHOL, HDL, LDLCALC, TRIG, CHOLHDL, LDLDIRECT in the last 72 hours. Thyroid Function Tests: No results for input(s): TSH, T4TOTAL, FREET4, T3FREE, THYROIDAB in the last 72 hours. Anemia Panel: No  results for input(s): VITAMINB12, FOLATE, FERRITIN, TIBC, IRON, RETICCTPCT in the last 72 hours. Sepsis Labs: No results for input(s): PROCALCITON, LATICACIDVEN in the last 168 hours.  Recent Results (from the past 240 hour(s))  Aerobic/Anaerobic Culture (surgical/deep wound)     Status: None   Collection Time: 02/13/18 11:44 AM  Result Value Ref Range Status   Specimen Description JP DRAINAGE  Final   Special Requests NONE  Final   Gram Stain   Final    FEW WBC PRESENT, PREDOMINANTLY PMN MODERATE GRAM NEGATIVE RODS FEW GRAM POSITIVE RODS Performed at [facility], [address], [place], [place] [address]    Culture   Final    RARE ESCHERICHIA COLI MODERATE LACTOBACILLUS SPECIES Standardized susceptibility testing for this organism is not available. MIXED ANAEROBIC FLORA PRESENT.  CALL LAB IF FURTHER  IID REQUIRED.    Report Status 02/18/2018 FINAL  Final   Organism ID, Bacteria ESCHERICHIA COLI  Final      Susceptibility   Escherichia coli - MIC*    AMPICILLIN 4 SENSITIVE Sensitive     CEFAZOLIN <=4 SENSITIVE Sensitive     CEFEPIME <=1 SENSITIVE Sensitive     CEFTAZIDIME <=1 SENSITIVE Sensitive     CEFTRIAXONE <=1 SENSITIVE Sensitive     CIPROFLOXACIN <=0.25 SENSITIVE Sensitive     GENTAMICIN <=1 SENSITIVE Sensitive     IMIPENEM <=0.25 SENSITIVE Sensitive     TRIMETH/SULFA <=20 SENSITIVE Sensitive     AMPICILLIN/SULBACTAM <=2 SENSITIVE Sensitive     PIP/TAZO <=4 SENSITIVE Sensitive     Extended ESBL NEGATIVE Sensitive     * RARE ESCHERICHIA COLI  Culture, Urine     Status: None   Collection Time: 02/17/18 12:45 PM  Result Value Ref Range Status   Specimen Description   Final    URINE, CATHETERIZED Performed at [facility] [address] [place], [place] [address]    Special Requests   Final    Immunocompromised Performed at [facility], [place] [address] [place], [place] [address]    Culture   Final    NO  GROWTH Performed at [facility], [place] [address], [place], [place] [address]    Report Status 02/18/2018 FINAL  Final  Culture, blood (routine x 2)     Status: None (Preliminary result)   Collection Time: 02/17/18  1:12 PM  Result Value Ref Range Status   Specimen Description   Final    BLOOD RIGHT ANTECUBITAL Performed at [facility] [address], [place], [place] [address]    Special Requests   Final    BOTTLES DRAWN AEROBIC ONLY Blood Culture adequate volume Performed at [facility] [address], [place], [place] [address]    Culture   Final    NO GROWTH 4 DAYS Performed at [facility], [place] [address], [place], [place] [address]    Report Status PENDING  Incomplete  Culture, blood (routine x 2)     Status: None (Preliminary result)   Collection Time: 02/17/18  1:14 PM  Result Value Ref Range Status   Specimen Description   Final    BLOOD RIGHT ANTECUBITAL Performed at [facility] [address] [place], [place] [address]    Special Requests   Final    BOTTLES DRAWN AEROBIC ONLY Blood Culture adequate volume Performed at [facility] [address] [place], [place] [address]    Culture   Final    NO GROWTH 4 DAYS Performed at [facility], [place] [address], [place], [place] [address]    Report Status PENDING  Incomplete  C difficile quick scan w PCR reflex     Status: None   Collection Time: 02/17/18  3:08 PM  Result Value Ref Range Status   C Diff antigen NEGATIVE NEGATIVE Final   C Diff toxin NEGATIVE NEGATIVE Final   C Diff interpretation No C. difficile detected.  Final    Comment: Performed at [facility], [place] [address] [place],  [address]         Radiology Studies: Dg Chest 1 View  Result Date: 02/20/2018 CLINICAL DATA:  Failed extubation with tracheostomy placed. Recent surgery 01/27/2018 for ovarian carcinoma. EXAM: CHEST   1 VIEW COMPARISON:  02/18/2018 FINDINGS: Tracheostomy in  adequate position. Enteric tube courses into the stomach and off the inferior portion of the film. Right IJ Port-A-Cath unchanged. Left-sided PICC line unchanged. Cardiac implant device left chest wall unchanged. Lungs are adequately inflated demonstrate persistent hazy opacification over the right lung and left base without significant change. Cardiomediastinal silhouette and remainder of the exam is unchanged. IMPRESSION: Hazy opacification over the right lung and left base unchanged. Multiple tubes and lines as described. Electronically Signed   By: Marin Olp M.D.   On: 02/20/2018 19:06   Dg Abd 1 View  Result Date: 02/20/2018 CLINICAL DATA:  NG tube EXAM: ABDOMEN - 1 VIEW COMPARISON:  02/20/2012 FINDINGS: Enteric tube terminates in the mid gastric body. Nonobstructive bowel gas pattern. Lung bases are essentially clear. IMPRESSION: Enteric tube terminates in the mid gastric body. Electronically Signed   By: Julian Hy M.D.   On: 02/20/2018 07:21   Dg Abd 1 View  Result Date: 02/19/2018 CLINICAL DATA:  Repositioning enteric tube EXAM: ABDOMEN - 1 VIEW COMPARISON:  Abdominal radiograph 02/19/2018 FINDINGS: Enteric tube tip and side-port project over the stomach. Mildly gaseous distended loops of bowel throughout the abdomen. Small layering bilateral pleural effusions. IMPRESSION: Enteric tube tip and side-port project over the stomach. Electronically Signed   By: Lovey Newcomer M.D.   On: 02/19/2018 19:24   Dg Abd Portable 1v  Result Date: 02/21/2018 CLINICAL DATA:  NG tube placement EXAM: PORTABLE ABDOMEN - 1 VIEW COMPARISON:  02/20/2018, 02/19/2018 FINDINGS: Tracheostomy tube tip is about 5 cm superior to carina. Right-sided central venous catheter tip over the SVC. Left-sided central venous catheter tip over the cavoatrial region. Esophageal tube tip in the left upper quadrant over the proximal stomach. No change in left base  consolidation. Gas-filled dilated bowel in the upper abdomen. Opacity overlying the port reservoir at the right mid chest may be due to summation of external support devices. IMPRESSION: 1. Esophageal tube tip in the left upper quadrant over the proximal stomach 2. No change in left lung base consolidation 3. Dilated air filled bowel in the upper abdomen Electronically Signed   By: Donavan Foil M.D.   On: 02/21/2018 03:09        Scheduled Meds: . chlorhexidine gluconate (MEDLINE KIT)  15 mL Mouth Rinse BID  . citalopram  20 mg Per Tube Daily  . enoxaparin (LOVENOX) injection  40 mg Subcutaneous Q24H  . free water  100 mL Per Tube Q8H  . [START ON 02/22/2018] furosemide  40 mg Intravenous Daily  . insulin aspart  0-20 Units Subcutaneous Q4H  . insulin aspart  4 Units Subcutaneous Q4H  . ipratropium-albuterol  3 mL Nebulization TID  . mouth rinse  15 mL Mouth Rinse 10 times per day  . multivitamin with minerals  1 tablet Oral Daily  . pantoprazole sodium  40 mg Per Tube BID  . potassium chloride  30 mEq Per Tube BID  . QUEtiapine  25 mg Per Tube Daily  . QUEtiapine  50 mg Oral QHS  . sodium chloride flush  10-40 mL Intracatheter Q12H   Continuous Infusions: . sodium chloride 10 mL/hr at 02/21/18 1400  . sodium chloride Stopped (02/21/18 1246)  . ampicillin-sulbactam (UNASYN) IV Stopped (02/21/18 1202)  . anidulafungin Stopped (02/21/18 1132)  . dextrose Stopped (02/18/18 0951)  . feeding supplement (VITAL AF 1.2 CAL) 1,000 mL (02/21/18 1408)  . vancomycin Stopped (02/21/18 1214)     LOS: 25 days     Georgette Shell, MD [facility] If 7PM-7AM, please contact night-coverage [email or web address] Password [email or web address] 02/21/2018, 2:38 PM

## 2018-02-21 NOTE — Care Management Note (Signed)
Case Management Note  Patient Details  Name: Jo Parker MRN: [id number] Date of Birth: [birth year]  Subjective/Objective:                  Remains on trach collar and vent support at 30%,Iv ns, iv unasyn,iv vancomycin,iv eraxis, tube feeding at 50cc/hrs, .K run 50meq q1hrx4 via c-line  Action/Plan: Following for progression and cm needs/[facility]. Expected Discharge Date:  01/29/18               Expected Discharge Plan:  Home/Self Care  In-House Referral:     Discharge planning Services  CM Consult  Post Acute Care Choice:    Choice offered to:     DME Arranged:    DME Agency:     HH Arranged:    HH Agency:     Status of Service:  In process, will continue to follow  If discussed at Long Length of Stay Meetings, dates discussed:    Additional Comments:  Leeroy Cha, RN 02/21/2018, 9:07 AM

## 2018-02-21 NOTE — Progress Notes (Signed)
Patient placed on ATC at this time. No distress noted. Vent on standby. RT will continue to monitor patient.

## 2018-02-21 NOTE — Progress Notes (Signed)
Pharmacy Antibiotic Note  Jo Parker is a 72 y.o. female admitted on 01/27/2018 with hx of ovarian cancer for bilateral salpingo-oophorectomy and omentectomy with debulking on 8/22.  She developed suspected intra-abdominal infection.  CT abdomen results worrisome for ischemic bowel with portal venous gas, bowel pneumatosis, and large amount of pneumoperitoneum without clear source of perforation.  Pharmacy has been consulted for meropenem dosing on 8/26 and added vancomycin on 8/27 for pneumonia. Vancomycin d/c'ed 8/30, but resumed 9/1 for new fevers. Anidulafungin added for empiric coverage given concern for intra-abdominal infection, ongoing fever. Meropenem>>Unasyn on 9/11.   Today, 02/21/18:  Total abx Day #22  ID plans on 28 days of total abx tx  D#5 Unasyn  D#22 anidulafungin  D#16 resumed vanc  Tmax 101.1, WBC WNL, SCr low/stable  Plan per ID:  Continue Unasyn 3 gm IV q6h  Continue Anidulafungin 100mg  IV q24h  Continue Vancomycin 750 mg IV q24  follow up renal fxn, culture results, clinical course, ID recommendations  Vancomycin levels as needed, at least weekly  Height: 5\' 3"  (160 cm) Weight: 133 lb 6.1 oz (60.5 kg) IBW/kg (Calculated) : 52.4  Temp (24hrs), Avg:100.1 F (37.8 C), Min:99.3 F (37.4 C), Max:100.6 F (38.1 C)  Recent Labs  Lab 02/16/18 1122 02/16/18 1401 02/17/18 0500 02/17/18 0838 02/18/18 0414 02/19/18 0548 02/20/18 0416 02/21/18 0323  WBC  --   --  9.2  --  9.4 7.8 9.2 8.2  CREATININE  --   --   --  0.48 0.53 0.45 0.41* 0.54  VANCOTROUGH 12*  --   --   --   --   --   --   --   VANCOPEAK  --  49*  --   --   --   --   --   --     Estimated Creatinine Clearance: 52.6 mL/min (by C-G formula based on SCr of 0.54 mg/dL).    Allergies  Allergen Reactions  . Oxycodone     confusion   Antimicrobials this admission: 8/26 Meropenem >> 9/11 8/27 Vancomycin >>8/30, resume 9/1 >>  8/27 Anidulafungin >>  9/11 unasyn >>  Dose adjustments  this admission: 9/4 Vanc peak @ 1440 = 36 mcg/mL, 9/5 Vanc trough @ 1200 = 9 mcg/mL --> AUC 485, no change 9/11 Vanc dose at 1200, Vanc trough at 1122 = 12, Vanc trough at 1400 = 49 - calculated AUC of 652 using 1g q24 - would decrease to 750mg  q24 for AUC of 489 (Pk 39 and Tr 8.6) 9/16 Dose at 1200, Peak at 1400, Trough 1130.  Calculated AUC ____  Microbiology results: 8/24 UCx: NGF 8/25 MRSA PCR: negative 8/26 BCx: NGF 8/27 Trach asp: few candida albicans 8/31 Intraperitoneal drain#3: NGF 9/1 BCx (x1): NGF 9/1 Trach asp: few candida albicans, few candida glabrata  9/1 UCx: NGF 9/5 abdomen: culture reincubated for better growth - few lactobacillus 9/8 JP drain: Rare Ecoli (pansensitive) and moderate lactobacillus species (not sent testing) 9/12 C diff: Ag neg, toxin neg 9/12 BCx2: ngtd 9/12 Ucx: NGF 9/12 sputum: ordered  Thank you for allowing pharmacy to be a part of this patient's care.  Gretta Arab PharmD, BCPS Pager [phone] 02/21/2018 10:48 AM

## 2018-02-21 NOTE — Progress Notes (Signed)
[facility] Progress Note Patient Name: Siana Panameno DOB: [birth year] MRN: [id number]   Date of Service  02/21/2018  HPI/Events of Note  K+ = 2.8 and Creatinine = 0.54. Already getting KCl PO. Currently on Lasix IV BID.  eICU Interventions  Will replace K+.     Intervention Category Major Interventions: Electrolyte abnormality - evaluation and management  Chesley Valls [place] 02/21/2018, 6:11 AM

## 2018-02-21 NOTE — Progress Notes (Signed)
CRITICAL VALUE ALERT  Critical Value:  Vancomycin Pk 140 Date & Time Notied:  5747  Provider Notified: Dr. Rodena Piety  Orders Received/Actions taken: waiting response

## 2018-02-21 NOTE — Progress Notes (Signed)
Patient ID: Jo Parker, female   DOB: [birth year], 72 y.o.   MRN: [id number]         [facility]  Date of Admission:  01/27/2018           Day 22 vancomycin        Day 22 anidulafungin        Day 5 ampicillin sulbactam ASSESSMENT: She continues to have low-grade fevers on broad empiric therapy for an draining pelvic abscess.  Recent blood and urine cultures were negative.  Testing for C. difficile was negative.  Plan on continuing current antibiotics for 6 more days to complete 4 weeks of therapy for her intra-abdominal infection.  PLAN: 1. Continue current antimicrobial therapy  Principal Problem:   Pelvic abscess in female Active Problems:   Severe sepsis ([place])   Peritonitis ([place])   HCAP (healthcare-associated pneumonia)   Postoperative wound infection   Primary peritoneal carcinomatosis ([place])   Ovarian cancer ([place])   Status post bilateral salpingo-oophorectomy   Severe protein-calorie malnutrition ([name])   COPD (chronic obstructive pulmonary disease) ([name])   HLD (hyperlipidemia)   Absolute anemia   White coat syndrome with hypertension   AKI (acute kidney injury) ([place])   Acute metabolic encephalopathy   Acute respiratory distress   Low urine output   Somnolence   At risk for fluid volume overload   Free intraperitoneal air   Acute on chronic respiratory failure ([name])   Pressure injury of skin   Aspiration pneumonia ([name])   Scheduled Meds: . chlorhexidine gluconate (MEDLINE KIT)  15 mL Mouth Rinse BID  . citalopram  20 mg Per Tube Daily  . enoxaparin (LOVENOX) injection  40 mg Subcutaneous Q24H  . free water  100 mL Per Tube Q8H  . [START ON 02/22/2018] furosemide  40 mg Intravenous Daily  . insulin aspart  0-20 Units Subcutaneous Q4H  . insulin aspart  4 Units Subcutaneous Q4H  . ipratropium-albuterol  3 mL Nebulization TID  . mouth rinse  15 mL Mouth Rinse 10 times per day  . multivitamin with minerals  1 tablet Oral Daily  .  pantoprazole sodium  40 mg Per Tube BID  . potassium chloride  30 mEq Per Tube BID  . QUEtiapine  25 mg Per Tube Daily  . QUEtiapine  50 mg Oral QHS  . sodium chloride flush  10-40 mL Intracatheter Q12H   Continuous Infusions: . sodium chloride 10 mL/hr at 02/21/18 1400  . sodium chloride Stopped (02/21/18 1246)  . ampicillin-sulbactam (UNASYN) IV Stopped (02/21/18 1202)  . anidulafungin Stopped (02/21/18 1132)  . feeding supplement (VITAL AF 1.2 CAL) 1,000 mL (02/21/18 1408)  . vancomycin Stopped (02/21/18 1214)   PRN Meds:.sodium chloride, sodium chloride, acetaminophen (TYLENOL) oral liquid 160 mg/5 mL, fentaNYL (SUBLIMAZE) injection, hydrALAZINE, iopamidol, labetalol, metoprolol tartrate, ondansetron (ZOFRAN) IV, sodium chloride flush  Review of Systems: Review of Systems  Unable to perform ROS: Intubated    Allergies  Allergen Reactions  . Oxycodone     confusion    OBJECTIVE: Vitals:   02/21/18 1218 02/21/18 1230 02/21/18 1300 02/21/18 1400  BP: (!) 166/94  (!) 139/114 (!) 146/81  Pulse: (!) 105  (!) 105 (!) 108  Resp: _0 Temp:  (!) 100.6 F (38.1 C)    TempSrc:  Oral    SpO2: 99%  96% 97%  Weight:      Height:       Body mass index is 23.63 kg/m.  Physical Exam  Constitutional:  She remains intubated.  She is alert.    Cardiovascular: Normal rate, regular rhythm and normal heart sounds.  No murmur heard. Pulmonary/Chest: She has no wheezes. She has no rales.    Abdominal: Soft. She exhibits no distension.  No more purulent drainage from her wound. She has had 10 cc of fluid out of her drain yesterday.    Skin: No rash noted.  Left arm PICC site looks good.    Lab Results Lab Results  Component Value Date   WBC 8.2 02/21/2018   HGB 8.5 (L) 02/21/2018   HCT 27.0 (L) 02/21/2018   MCV 80.8 02/21/2018   PLT 346 02/21/2018    Lab Results  Component Value Date   CREATININE 0.54 02/21/2018   BUN 11 02/21/2018   NA 141 02/21/2018   K 2.8  (L) 02/21/2018   CL 96 (L) 02/21/2018   CO2 37 (H) 02/21/2018    Lab Results  Component Value Date   ALT 21 02/21/2018   AST 27 02/21/2018   ALKPHOS 89 02/21/2018   BILITOT 0.3 02/21/2018     Microbiology: Recent Results (from the past 240 hour(s))  Aerobic/Anaerobic Culture (surgical/deep wound)     Status: None   Collection Time: 02/13/18 11:44 AM  Result Value Ref Range Status   Specimen Description JP DRAINAGE  Final   Special Requests NONE  Final   Gram Stain   Final    FEW WBC PRESENT, PREDOMINANTLY PMN MODERATE GRAM NEGATIVE RODS FEW GRAM POSITIVE RODS Performed at [facility], [place] [address], [place], [place] [address]    Culture   Final    RARE ESCHERICHIA COLI MODERATE LACTOBACILLUS SPECIES Standardized susceptibility testing for this organism is not available. MIXED ANAEROBIC FLORA PRESENT.  CALL LAB IF FURTHER IID REQUIRED.    Report Status 02/18/2018 FINAL  Final   Organism ID, Bacteria ESCHERICHIA COLI  Final      Susceptibility   Escherichia coli - MIC*    AMPICILLIN 4 SENSITIVE Sensitive     CEFAZOLIN <=4 SENSITIVE Sensitive     CEFEPIME <=1 SENSITIVE Sensitive     CEFTAZIDIME <=1 SENSITIVE Sensitive     CEFTRIAXONE <=1 SENSITIVE Sensitive     CIPROFLOXACIN <=0.25 SENSITIVE Sensitive     GENTAMICIN <=1 SENSITIVE Sensitive     IMIPENEM <=0.25 SENSITIVE Sensitive     TRIMETH/SULFA <=20 SENSITIVE Sensitive     AMPICILLIN/SULBACTAM <=2 SENSITIVE Sensitive     PIP/TAZO <=4 SENSITIVE Sensitive     Extended ESBL NEGATIVE Sensitive     * RARE ESCHERICHIA COLI  Culture, Urine     Status: None   Collection Time: 02/17/18 12:45 PM  Result Value Ref Range Status   Specimen Description   Final    URINE, CATHETERIZED Performed at [facility] [address], [place], [place] [address]    Special Requests   Final    Immunocompromised Performed at [facility], [place] [address] [place], [place] [address]     Culture   Final    NO GROWTH Performed at [facility], [place] [address], [place], [place] [address]    Report Status 02/18/2018 FINAL  Final  Culture, blood (routine x 2)     Status: None (Preliminary result)   Collection Time: 02/17/18  1:12 PM  Result Value Ref Range Status   Specimen Description   Final    BLOOD RIGHT ANTECUBITAL Performed at [facility],  [place] [address], [place], [place] [address]    Special Requests   Final    BOTTLES DRAWN AEROBIC ONLY Blood Culture adequate volume Performed at [facility] [address], [place], [place] [address]    Culture   Final    NO GROWTH 4 DAYS Performed at [facility], [place] [address], [place], [place] [address]    Report Status PENDING  Incomplete  Culture, blood (routine x 2)     Status: None (Preliminary result)   Collection Time: 02/17/18  1:14 PM  Result Value Ref Range Status   Specimen Description   Final    BLOOD RIGHT ANTECUBITAL Performed at [facility] [address] [place], [place] [address]    Special Requests   Final    BOTTLES DRAWN AEROBIC ONLY Blood Culture adequate volume Performed at [facility] [address] [place], [place] [address]    Culture   Final    NO GROWTH 4 DAYS Performed at [facility], [place] [address], [place], [place] [address]    Report Status PENDING  Incomplete  C difficile quick scan w PCR reflex     Status: None   Collection Time: 02/17/18  3:08 PM  Result Value Ref Range Status   C Diff antigen NEGATIVE NEGATIVE Final   C Diff toxin NEGATIVE NEGATIVE Final   C Diff interpretation No C. difficile detected.  Final    Comment: Performed at [facility], [place] [address], [place], [place] [address]    Michel Bickers, [place] [facility] [phone] pager   [phone] cell 02/21/2018, 2:49 PM

## 2018-02-21 NOTE — Progress Notes (Signed)
Entered room, found pt with secretions coming from her mouth. Asked if pt threw up, nodded her head yes. Cleaned pt up, performed suctioning, medicated with IV pain medicine. Turned around and found NG tube slipping out of pt's nose. NG tube not completely out, slipped back in right nostril, replaced NG tube to an external length of 53 cm. Abdominal xray placed., MD notified. Nausea medicine requested. Will continue to monitor pt closely at this time. Appears to be in no apparent distress, endorses nausea.

## 2018-02-21 NOTE — Progress Notes (Signed)
S: Patient alert and responds with nods to questions. Family tells me she vomited early this morning before the 7am shift. Nursing on at current time denies report of emesis and understood she just had secretions after NGT came out. Review of nursing notes it does appear she may have vomited. Denies pain, but also denies feeling "full"   O:  JP with minimal output. I am unable to determine the consistency tonight of the output. Abdo - hyperactive, high-pitched bowel sounds with marked distension. Nontender, no rebound, no guarding  A/P: See note earlier in day from [facility], NP In addition, given the emesis, distension, and clinical exam I have placed her tube feeds on hold, asked nursing to place the NGtube to suction and ordered abdominal imaging  Noting earlier xray today was for NGplacement only. Will reassess tomorrow to see if we should restart tube feeds or continue NG suction.

## 2018-02-21 NOTE — Evaluation (Signed)
[facility] Speaking Valve - Evaluation Patient Details  Name: Jo Parker MRN: [id number] Date of Birth: 1945/08/29  Today's Date: 02/21/2018 Time: 3532-9924 SLP Time Calculation (min) (ACUTE ONLY): 27 min  Past Medical History:  Past Medical History:  Diagnosis Date  . Anxiety and depression   . Aortic aneurysm ([place])    see ov note 03/2017   . Arthritis   . Carcinomatosis ([place]) 10/2017  . COPD (chronic obstructive pulmonary disease) ([place]) 10/23/2017  . Depression   . Family history of breast cancer   . Family history of prostate cancer   . Family history of uterine cancer   . History of diverticulitis   . HLD (hyperlipidemia) 10/23/2017  . Hypercholesterolemia   . Hypertension   . Mitral valve prolapse   . Sleep apnea    cpap machine mask and tubing in [place] per patient.  Patient does not have mask or tubing to bring on DOS of 01/27/2018.   Marland Kitchen Uterine cancer ([place])    had partial hysterectomy at 27   Past Surgical History:  Past Surgical History:  Procedure Laterality Date  . ABDOMINAL HYSTERECTOMY    . cardiac monitor implant    . CEREBRAL ANEURYSM REPAIR    . CERVICAL SPINE SURGERY     bone spurs  . DEBULKING N/A 01/27/2018   Procedure: RADICAL DEBULKING;  Surgeon: [name], MD;  Location: [facility];  Service: Gynecology;  Laterality: N/A;  . IR FLUORO GUIDE PORT INSERTION RIGHT  10/14/2017  . IR PARACENTESIS  10/14/2017  . IR US GUIDE VASC ACCESS RIGHT  10/14/2017  . LAPAROTOMY N/A 01/27/2018   Procedure: EXPLORATORY LAPAROTOMY WITH REMOVAL OF BILATERAL TUBES AND OVARIES;  Surgeon: [name], MD;  Location: [facility];  Service: Gynecology;  Laterality: N/A;  . LAPAROTOMY N/A 01/31/2018   Procedure: EXPLORATORY LAPAROTOMY, ABDOMINAL WASHOUT;  Surgeon: Isabel Caprice, MD;  Location: [facility];  Service: Gynecology;  Laterality: N/A;  . OMENTECTOMY N/A 01/27/2018   Procedure: OMENTECTOMY;  Surgeon: [name], MD;  Location: [facility];  Service: Gynecology;   Laterality: N/A;  . 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  She is completed 3 cycles of chemotherapy and is now status post bilateral salpingo-oophorectomy and omentectomy with debulking on 8/22.   Post op complication for possible bowel obstruction and presumed aspiration during intubation.   Acute on chronic hypoxemic respiratory failure requiring prolonged intubation (Pt was intubated 8/26 and tracheostomy was placed 9/14) and now tracheostomy, tolerating TCT and Vent support at night.  Order received for PMSV trial.   Pt on 28% trach collar   Assessment / Plan / Recommendation Clinical Impression  RT present and suctioned pt as well as deflated her cuff.  PMSV trial initiated but valve was not placed today as pt was only able to redirect subglottic air to upper airway x1 of 3 trials.  NO phonation observed with finger occlusion.  ? if pt is severely fatigued - and deconditioned from ventilator requirement since August 26th - trached 9/12.  Advised pt and spouse that we will continue trials.  Spouse asked several good questions regarding PMSV and was thoroughly educated.  Spoke to son [name] on the phone re: trial for tomorrow - to which he would like to present.  Will call [name] at least one hours prior to session to allow his presence.  Pt, spouse and son agreeable to plan.   SLP  Visit Diagnosis: Aphonia (R49.1)    SLP Assessment  Patient needs continued Speech Lanaguage Pathology Services    Follow Up Recommendations  (tbd)    Frequency and Duration min 2x/week  2 weeks    PMSV Trial PMSV was placed for: did not place due to lack of upper airway airflow Able to redirect subglottic air through upper airway: Yes(x1 only of 3 trials, pt appeared fatigued) Able to Attain Phonation: No Able to Expectorate Secretions: No attempts Breath Support for Phonation: Other (comment)(no  phonation observed and pt is very weak) Respirations During Trial: 26 SpO2 During Trial: 98 % Pulse During Trial: 107 Behavior: Alert   Tracheostomy Tube  Additional Tracheostomy Tube Assessment Fenestrated: No    Vent Dependency  Vent Dependent: (night vent) FiO2 (%): 28 %    Cuff Deflation Trial  GO Tolerated Cuff Deflation: Yes Length of Time for Cuff Deflation Trial: 15 minutes Behavior: Alert Cuff Deflation Trial - Comments: pt able to move air through upper airway with finger occlusion x1 but further attempts not successful, she also did not produce voicing with cues        Macario Golds 02/21/2018, 5:25 PM Luanna Salk, [facility] SLP [place] Pager [phone] Office [phone]

## 2018-02-21 NOTE — Progress Notes (Addendum)
Gynecologic Oncology Progress Note  72 year old female diagnosed with Stage IV ovarian cancer who received four cycles of neoadjuvant chemotherapy with carboplatin and taxol with fourth cycle completed on 12/20/17 .  She underwent interval debulking surgery with Dr. Skeet Latch on 01/27/18 consisting of an exploratory laparotomy infra-gastric omentectomy bilateral salpingo-oophorectomy radical optimal tumor debulking, resection of anterior abdominal wall.  Her post-operative course was complicated by findings to suggest bowel perforation on CT from 01/31/18 post-op with no site visualized on re-operation. It was felt that aspiration most likely occurred during intubation with the re-operation.          Subjective: Patient alert. Nodding answers to questions.  Brother at the bedside.     Objective: Vital signs in last 24 hours: Temp:  [99.3 F (37.4 C)-100.6 F (38.1 C)] 100.6 F (38.1 C) (09/16 0900) Pulse Rate:  [56-117] 111 (09/16 0900) Resp:  [16-30] 21 (09/16 0900) BP: (121-157)/(54-102) 146/76 (09/16 0900) SpO2:  [96 %-100 %] 97 % (09/16 0900) FiO2 (%):  [28 %-30 %] 28 % (09/16 0807) Weight:  [133 lb 6.1 oz (60.5 kg)] 133 lb 6.1 oz (60.5 kg) (09/16 0500) Last BM Date: 02/20/18  Intake/Output from previous day: 09/15 0701 - 09/16 0700 In: 2418.7 [I.V.:35.7; CN/OB:0962.8; IV Piggyback:1128.8] Out: 3600 [Urine:3400; Stool:200]  Physical Examination: General: alert, appears comfortable, nodding head when asked questions Cardio: Tachycardic at 111 bpm. Resp: Coarse breath sounds.  Trach in place GI: Abdomen distended with active bowel sounds.  Flexiseal in place with liquid brown stool- c diff neg.  Dressing changed without difficulty.  Granulation tissue noted on the fascia.  Packing with saline dampened kerliz.  LLQ JP stripped with thick, tan colored output.  There is no leaking around the tubing entry site. Prior JP sites are healing with no drainage or erythema. Upper aspect of the  incision with steri strips in place. Extremities: generalized edema improving, non-pitting.  Port a cath location with bandaid in place. Temporal wasting noted.   Labs: WBC/Hgb/Hct/Plts:  8.2/8.5/27.0/346 (09/16 3662) BUN/Cr/glu/ALT/AST/amyl/lip:  11/0.54/--/21/27/--/-- (09/16 9476)  Assessment: 72 y.o. s/p Procedure(s): Interval debulking surgery with Dr. Skeet Latch on 01/27/18 consisting of an exploratory laparotomy infra-gastric omentectomy bilateral salpingo-oophorectomy radical optimal tumor debulking, resection of anterior abdominal wall.  Reoperation of EXPLORATORY LAPAROTOMY, ABDOMINAL WASHOUT on 01/31/18:   Heme:  Post-operative anemia: Hgb 8.5 and Hct 27 this am. Last transfusion 02/19/18.  Anemia related to acute critical illness, no evidence of active bleeding.   ID: Fever as high as 100.6 on 02/21/18. Urine for culture with no growth and sputum culture ordered. Stool for c diff negative. Blood cultures drawn 02/17/18 with no growth <24 hrs.  Presumed sepsis initially from gastrointestinal source but unable to identify the perforation after exp lap on 01/31/18. Contribution now from pneumonia and possibly undrained left pelvic fluid collection seen on CT scan 02/07/18; however the pelvic collection is improving. 02/13/18 enteric versus old hematoma appearing contents in JP. Cultures showing FEW WBC PRESENT, PREDOMINANTLY PMN MODERATE GRAM NEGATIVE RODS FEW GRAM POSITIVE RODS.   Repeat CT 02/14/18: Similar to slight decrease in size of ill-defined complex fluid and gas in the lateral left hemipelvis. 2. No evidence of contrast communication between the thickened sigmoid and this pelvic sidewall process. 3. Foley catheter and air within the urinary bladder, which is not decompressed. Correlate with catheter function.   WBC 8.2 this am. Granix received 02/01/18 per Dr. Alvy Bimler.  Vancomycin stopped on 02/04/18 and then fevers began again. Restarted Vancomycin on 02/06/18.  Anidulafungin initiated 02/01/18.   Meropenum IV started 01/31/18 and discontinued on 02/16/18.  Unasyn started 02/16/18.  Blood cultures from 01/31/18 & 02/06/18 &  02/17/18 with no growth. Tracheal aspirate from 02/01/18 resulting few gram positive rods. Repeated 02/06/18 showed few yeast.  IR feels no safe window for drainage of pelvic fluid collection.  Repeat imaging showed improvement. Appreciate ID input  CV: Tachycardia managed per Hospitalist. Telemetry, diuresis per CC, lopressor, and labetalol ordered PRN.  Resp: ARDS in setting of aspiration pneumonia and sepsis.   Chest 1 view 02/20/18: IMPRESSION: Hazy opacification over the right lung and left base unchanged. Multiple tubes and lines as described. - Tracheostomy placed 02/17/18. Plan per CC- ATC during the day and rest on PRVC overnight.  GI: Tube feedings running continuously.  C diff negative. Flexiseal in place. -CT AP on 01/31/18 with findings consistent with GI perforation.  No obvious perforation identified on re-operation on 01/31/18. -TPN initiated 02/02/18 and discontinued 02/10/18. -02/13/18 -Enteric versus old hematoma contents in LLQ JP.  -CT pelvis 02/14/18: Stable to slight decrease in pelvic fluid collection, no evidence of contrast communication. -JP fluid culture from 02/13/18:  FEW WBC PRESENT, PREDOMINANTLY PMN  MODERATE GRAM NEGATIVE RODS FEW GRAM POSITIVE RODS  GU: AKI post-op resolved. Foley in place with yellow urine. Creatinine 0.54 this am. Plan for foley removal today with purewick placement per RN report.  FEN: Replacement per Hospitalist Team- K+ 2.8, Phos 3.5, Mag 1.7 this am  Prophylaxis: SCDs in place. Continue Lovenox prophylaxis. No active bleeding.  Plan: Continue care per Hospitalist Team, CC, ID, and Dr. Gerarda Fraction. Continue wet to dry dressing changes twice daily.    Plan per CC- ATC during the day and rest on PRVC overnight.  Sarim Rothman D Yazleen Molock 02/21/2018, 9:48 AM

## 2018-02-21 NOTE — Progress Notes (Addendum)
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  She is completed 3 cycles of chemotherapy and is now status post bilateral salpingo-oophorectomy and omentectomy with debulking on 8/22.   8/26 transferred to ICU w/ concern for perforation w/ free air noted.  Imipenem started. Went for exploratory laparotomy with washout.  No significant contamination identified, presumed spontaneously closed microperf. Returned with abdominal drains postoperative chest x-ray showing evidence of aspiration pneumonia; mixed respiratory and metabolic acidosis & in septic shock. Required intubation.   8/27 GI medicine consulted, did not feel she had overt GIB.  Rec's prophylaxis with PPI.  Signed off.  Weaning pressors. Hgb dropped from 8.8 to 7.4; got 2 units of blood,  febrile, added. vancomycin.  Neupogen given for neutropenia.   8/28-9/1 Shock resolved, TPN started. Fevers. Delirium barrier to weaning.  ABX for fever  9/02-9/6 Delirium. Sedation difficult, diuresing, remains on abx. spiking temps, loculated fluid collection identified in left pelvis. Not amedable to drainage.  ID consulted. Started seroquel. 9/4. Upper 2 drains removed. Staples removed.from incision site.  9/4-9/9 ID consulted by family.  Fevers ongoing, CT abd with improved fluid collections.  Failing PSV wean.  No pressors.  No sedation.   Husband deferring decisions about care to son (newly married).   9/10  Sister at bedside - says she is not in favor of trach but says is the decision of son/dpoa. Son met with Dr Alvy Bimler for goals of care - current status precludes chemo but goal is trach and full code and full medical care and later chemo when ECOG improves.  Low grade fever persists - ID following. More  awake today. No Perf leak on CT abd and restarted TF  9/12-9/14  s/p Trach - Dr Nelda Marseille. Levophed post trach.  Fevers.  1 unit PRBC  9/15 ATC for first time; tolerating well. Did PRVC overnight  Subjective / Interval Events  RT reports pt weaning on ATC without difficulty.  No acute events overnight.     Objective    Blood pressure (!) 146/76, pulse (!) 111, temperature (!) 100.6 F (38.1 C), resp. rate (!) 21, height _0  (1.6 m), weight 60.5 kg, SpO2 97 %.    Vent Mode: PRVC FiO2 (%):  [28 %-30 %] 28 % Set Rate:  [20 bmp] 20 bmp Vt Set:  [330 mL] 330 mL PEEP:  [5 cmH20] 5 cmH20 Plateau Pressure:  [11 cmH20-15 cmH20] 14 cmH20   Intake/Output Summary (Last 24 hours) at 02/21/2018 1011 Last data filed at 02/21/2018 0920 Gross per 24 hour  Intake 2145.83 ml  Output 3460 ml  Net -1314.17 ml   Filed Weights   02/19/18 0544 02/20/18 0451 02/21/18 0500  Weight: 59 kg 60.1 kg 60.5 kg     EXAM  General: chronically ill adult female lying in bed.  Husband at bedside HEENT: MM pink/moist, #6 trach midline c/d/i, foam dressing in place for skin protection  Neuro: Awake, alert, smiles, follows commands, generalized weakness  CV: s1s2 rrr, no m/r/g PULM: even/non-labored, lungs bilaterally clear  GI: protuberant, bsx4 active Extremities: warm/dry, trace generalized edema  Skin:  Warm/dry   Labs    PULMONARY No results for input(s): PHART, PCO2ART, PO2ART, HCO3, TCO2, O2SAT in the last 168 hours.  Invalid input(s): PCO2,  PO2  CBC Recent Labs  Lab 02/19/18 0548 02/19/18 1836 02/20/18 0416 02/21/18 0323  HGB 6.9* 8.1* 8.7* 8.5*  HCT 21.9* 25.2* 27.3* 27.0*  WBC 7.8  --  9.2 8.2  PLT 307  --  332 346    COAGULATION Recent Labs  Lab 02/17/18 0500 02/17/18 0838  INR 1.28 1.28   CARDIAC   No results for input(s): TROPONINI in the last 168 hours. No results for input(s): PROBNP in the last 168 hours.  CHEMISTRY Recent Labs  Lab 02/16/18 0455 02/17/18 0500  02/17/18 8588 02/18/18 0414 02/19/18 0548 02/20/18 0416 02/21/18 0323  NA  --   --  143 141 142 145 141  K  --   --  2.6* 3.0* 2.6* 2.7* 2.8*  CL  --   --  87* 91* 92* 97* 96*  CO2  --   --  45* 42* 40* 38* 37*  GLUCOSE  --   --  133* 226* 113* 112* 151*  BUN  --   --  _0 CREATININE  --   --  0.48 0.53 0.45 0.41* 0.54  CALCIUM  --   --  8.2* 7.7* 7.7* 8.2* 7.9*  MG 1.9 1.7  --  1.8 1.6* 1.8 1.7  PHOS 2.2* 2.7  --  2.2* 3.3  --  3.5   Estimated Creatinine Clearance: 52.6 mL/min (by C-G formula based on SCr of 0.54 mg/dL).  LIVER Recent Labs  Lab 02/17/18 0500 02/17/18 0838 02/20/18 0416 02/21/18 0323  AST  --   --  27 27  ALT  --   --  23 21  ALKPHOS  --   --  103 89  BILITOT  --   --  0.7 0.3  PROT  --   --  6.5 6.5  ALBUMIN  --   --  1.7* 1.6*  INR 1.28 1.28  --   --    INFECTIOUS No results for input(s): LATICACIDVEN, PROCALCITON in the last 168 hours.  ENDOCRINE CBG (last 3)  Recent Labs    02/20/18 2308 02/21/18 0322 02/21/18 0817  GLUCAP 90 136* 128*   IMAGING  Dg Chest 1 View  Result Date: 02/20/2018 CLINICAL DATA:  Failed extubation with tracheostomy placed. Recent surgery 01/27/2018 for ovarian carcinoma. EXAM: CHEST  1 VIEW COMPARISON:  02/18/2018 FINDINGS: Tracheostomy in adequate position. Enteric tube courses into the stomach and off the inferior portion of the film. Right IJ Port-A-Cath unchanged. Left-sided PICC line unchanged. Cardiac implant device left chest wall unchanged. Lungs are adequately inflated demonstrate persistent hazy opacification over the right lung and left base without significant change. Cardiomediastinal silhouette and remainder of the exam is unchanged. IMPRESSION: Hazy opacification over the right lung and left base unchanged. Multiple tubes and lines as described. Electronically Signed   By: Marin Olp M.D.   On: 02/20/2018 19:06   Dg Abd 1 View  Result Date: 02/20/2018 CLINICAL DATA:  NG tube EXAM: ABDOMEN -  1 VIEW COMPARISON:  02/20/2012 FINDINGS: Enteric tube terminates in the mid gastric body. Nonobstructive bowel gas pattern. Lung bases are essentially clear. IMPRESSION: Enteric tube terminates in the mid gastric body. Electronically Signed   By: Julian Hy M.D.   On: 02/20/2018 07:21   Dg Abd 1 View  Result Date: 02/19/2018 CLINICAL DATA:  Repositioning enteric tube EXAM: ABDOMEN - 1 VIEW COMPARISON:  Abdominal radiograph 02/19/2018 FINDINGS: Enteric tube tip and side-port project over the stomach. Mildly gaseous distended loops  of bowel throughout the abdomen. Small layering bilateral pleural effusions. IMPRESSION: Enteric tube tip and side-port project over the stomach. Electronically Signed   By: Lovey Newcomer M.D.   On: 02/19/2018 19:24   Dg Abd Portable 1v  Result Date: 02/21/2018 CLINICAL DATA:  NG tube placement EXAM: PORTABLE ABDOMEN - 1 VIEW COMPARISON:  02/20/2018, 02/19/2018 FINDINGS: Tracheostomy tube tip is about 5 cm superior to carina. Right-sided central venous catheter tip over the SVC. Left-sided central venous catheter tip over the cavoatrial region. Esophageal tube tip in the left upper quadrant over the proximal stomach. No change in left base consolidation. Gas-filled dilated bowel in the upper abdomen. Opacity overlying the port reservoir at the right mid chest may be due to summation of external support devices. IMPRESSION: 1. Esophageal tube tip in the left upper quadrant over the proximal stomach 2. No change in left lung base consolidation 3. Dilated air filled bowel in the upper abdomen Electronically Signed   By: Donavan Foil M.D.   On: 02/21/2018 03:09   Micro Data: Blood Culture x2 8/26 >> neg Sputum 8/27: Abundant yeast, few Candida albicans Blood Cx 9/1 >> negative Respiratory 9/1 >> few Candida Urine 9/1 >> negative Peritoneal fluid 8/31 >> neg Abd Drainage 9/5 >> few lactobacillus  JP Drainage 9/8 >> E-Coli C-Diff 9/12 >> negative  Antimicrobials:    Meropenem 8/26 >> 9/11 Vancomycin 8/27 >> 8/30; 9/1 >> 9/11 Eraxis 8/27 >>  Unasyn 9/11 >>   Studies: CT abdomen 8/26: Hemic bowel with portal venous gas and bowel pneumatosis with large amount of pneumoperitoneum.  Unclear as to site of perforation.  Multiple pockets of air adjacent to the sigmoid colon.  Enhancement of the peritoneum concerning for peritonitis.  There was aortic arterial sclerosis.  Echocardiogram 8/27: Particular ejection fraction 65 to 70%.  She has moderate LVH, and hyperdynamic LV function.  There is grade 1 diastolic dysfunction.  Trivial MR.  Normal left atrial size.  The IV C was not visualized  CT abdomen 9/2 >> drainage catheters within the pelvis, RUQ, LUQ.  Significant interval decrease in size of previously described intra-abdominal fluid, no residual free intraperitoneal air. There is a persistent loculated fluid & gas collection along the left pelvic sidewall measuring up to 10 cm.  Moderate bilateral pleural effusions, underlying opacities favored to represent atelectasis.  Marked wall thickening of the sigmoid colon.  Peripheral low attenuation within the spleen concerning for splenic infarcts.  Wall thickening of the urinary bladder.   CT abdomen 9/6: Improving left lateral pelvic fluid collection, resolved peritoneal fluid in the left lower abdomen.  Concentric thickening of the sigmoid colon.  Decreased bilateral effusion and atelectasis.  CT ABD 9/9 Similar to slight decrease in size of ill-defined complex fluid and gas in the lateral left hemipelvis. 2. No evidence of contrast communication between the thickened sigmoid and this pelvic sidewall process. 3. Foley catheter and air within theurinary bladder, which is not decompressed. Correlate with catheter function.   Assessment & Plan:   Acute on Chronic Hypoxemic Respiratory Failure s/p Trach (9/12) - in the setting of aspiration pneumonia / ARDS.    Right pleural effusion - No sig pocket to drain on  bedside US 9/5.   P: ATC as tolerated during day PRVC as rest mode overnight  Pulmonary hygiene as able  PT consult Reduce lasix to 40 mg QD SLP evaluation for PMV  Status post exploratory laparotomy for free air on 8/26 ; following bilateral salpingo-oophorectomy, omentectomy, and  debulking surgery on 8/22. Developed fluid collections not amenable to IR drainage.  Reducing in size on CTA/P P: Recommendations per GYN/ONC   Severe Sepsis  - with neutropenia in setting of peritonitis (microperforation), complicated by aspiration pneumonia. P: ID following, appreciate input  Defer abx to ID Per primary  Sinus tachycardia Hypertension Grade 1 diastolic dysfunction  Elevated Troponin  - on admit resolved.  Likely demand Bradycardia  - periods of brady to the 30's Labile BP - w/episodes of hypotension seemingly exacerbated by sedation P: Per TRH / primary SVC  Acute metabolic encephalopathy - in setting of critical illness, sedation.  Mental status improving.  P: RASS Goal: 0 Minimize sedation as able   Wean seroquel to off per primary Continue clelexa  Electrolyte Disturbances  P: Per TRH   Severe Protein Calorie Malnutrition, prealbumin 3.5 P: Continue TF  Per TRH   Postoperative Anemia.  - S/p 1 unit PRBC 02/19/18 P: Per TRH  Transfuse per ICU guidelines   Hyperglycemia P: SSI per primary   Best practice/Goals of care  DVT prophylaxis: LMWH GI prophylaxis: PPI Diet: TF  Mobility: BR Code Status: Full Code Family Communication: Husband updated at bedside 9/16 on NP rounds.   Disposition / Summary of Today's Plan 02/21/2018  Push for ATC during day as tolerated.  If fatigues, return to full support.  Rest on PRVC overnight.  She will likely need overnight for some time due to deconditioning.  PCCM will continue to see intermittently for trach care.   Noe Gens, NP-C [facility] Pgr: [phone] or if no answer  [phone] 02/21/2018, 10:30 AM

## 2018-02-22 ENCOUNTER — Inpatient Hospital Stay (HOSPITAL_COMMUNITY): Payer: Medicare PPO

## 2018-02-22 ENCOUNTER — Encounter: Payer: Self-pay | Admitting: Oncology

## 2018-02-22 LAB — CBC WITH DIFFERENTIAL/PLATELET
BASOS ABS: 0 10*3/uL (ref 0.0–0.1)
BASOS PCT: 0 %
Eosinophils Absolute: 0.4 10*3/uL (ref 0.0–0.7)
Eosinophils Relative: 5 %
HCT: 27.7 % — ABNORMAL LOW (ref 36.0–46.0)
Hemoglobin: 8.6 g/dL — ABNORMAL LOW (ref 12.0–15.0)
LYMPHS PCT: 21 %
Lymphs Abs: 1.6 10*3/uL (ref 0.7–4.0)
MCH: 25.4 pg — ABNORMAL LOW (ref 26.0–34.0)
MCHC: 31 g/dL (ref 30.0–36.0)
MCV: 81.7 fL (ref 78.0–100.0)
MONO ABS: 0.7 10*3/uL (ref 0.1–1.0)
Monocytes Relative: 9 %
NEUTROS ABS: 5 10*3/uL (ref 1.7–7.7)
Neutrophils Relative %: 65 %
Platelets: 372 10*3/uL (ref 150–400)
RBC: 3.39 MIL/uL — AB (ref 3.87–5.11)
RDW: 22.3 % — AB (ref 11.5–15.5)
WBC: 7.7 10*3/uL (ref 4.0–10.5)

## 2018-02-22 LAB — COMPREHENSIVE METABOLIC PANEL
ALBUMIN: 1.7 g/dL — AB (ref 3.5–5.0)
ALT: 23 U/L (ref 0–44)
AST: 29 U/L (ref 15–41)
Alkaline Phosphatase: 84 U/L (ref 38–126)
Anion gap: 11 (ref 5–15)
BUN: 15 mg/dL (ref 8–23)
CHLORIDE: 100 mmol/L (ref 98–111)
CO2: 36 mmol/L — AB (ref 22–32)
Calcium: 8.6 mg/dL — ABNORMAL LOW (ref 8.9–10.3)
Creatinine, Ser: 0.47 mg/dL (ref 0.44–1.00)
GFR calc Af Amer: >60 mL/min (ref >60)
GFR calc non Af Amer: >60 mL/min (ref >60)
GLUCOSE: 103 mg/dL — AB (ref 70–99)
Potassium: 3.2 mmol/L — ABNORMAL LOW (ref 3.5–5.1)
SODIUM: 147 mmol/L — AB (ref 135–145)
Total Bilirubin: 0.6 mg/dL (ref 0.3–1.2)
Total Protein: 6.8 g/dL (ref 6.5–8.1)

## 2018-02-22 LAB — GLUCOSE, CAPILLARY
GLUCOSE-CAPILLARY: 100 mg/dL — AB (ref 70–99)
GLUCOSE-CAPILLARY: 104 mg/dL — AB (ref 70–99)
GLUCOSE-CAPILLARY: 105 mg/dL — AB (ref 70–99)
GLUCOSE-CAPILLARY: 109 mg/dL — AB (ref 70–99)
GLUCOSE-CAPILLARY: 134 mg/dL — AB (ref 70–99)
GLUCOSE-CAPILLARY: 84 mg/dL (ref 70–99)
Glucose-Capillary: 107 mg/dL — ABNORMAL HIGH (ref 70–99)

## 2018-02-22 LAB — VANCOMYCIN, TROUGH: Vancomycin Tr: 7 ug/mL — ABNORMAL LOW (ref 15–20)

## 2018-02-22 LAB — CULTURE, BLOOD (ROUTINE X 2)
CULTURE: NO GROWTH
CULTURE: NO GROWTH
SPECIAL REQUESTS: ADEQUATE
SPECIAL REQUESTS: ADEQUATE

## 2018-02-22 LAB — PHOSPHORUS: PHOSPHORUS: 4.1 mg/dL (ref 2.5–4.6)

## 2018-02-22 LAB — MAGNESIUM: Magnesium: 2 mg/dL (ref 1.7–2.4)

## 2018-02-22 MED ORDER — VANCOMYCIN HCL IN DEXTROSE 750-5 MG/150ML-% IV SOLN
750.0000 mg | INTRAVENOUS | Status: DC
  Filled 2018-02-22: qty 150

## 2018-02-22 MED ORDER — VITAL AF 1.2 CAL PO LIQD
1000.0000 mL | ORAL | Status: DC
  Administered 2018-02-22: 1000 mL

## 2018-02-22 MED ORDER — POTASSIUM CHLORIDE 10 MEQ/50ML IV SOLN
10.0000 meq | INTRAVENOUS | Status: AC
  Administered 2018-02-22 (×3): 10 meq via INTRAVENOUS
  Filled 2018-02-22 (×3): qty 50

## 2018-02-22 MED ORDER — VANCOMYCIN HCL IN DEXTROSE 1-5 GM/200ML-% IV SOLN
1000.0000 mg | INTRAVENOUS | Status: DC
  Administered 2018-02-22 – 2018-02-28 (×7): 1000 mg via INTRAVENOUS
  Filled 2018-02-22 (×7): qty 200

## 2018-02-22 NOTE — Progress Notes (Addendum)
Nutrition Follow-up  DOCUMENTATION CODES:   Not applicable  INTERVENTION:  - When TF able to be re-started: recommend 30 mL Prostat once/day with Osmolite 1.5 @ 20 mL/hr to advance by 10 mL every 8 hours to reach goal rate of Osmolite 1.5 @ 50 mL/hr. At goal rate, this regimen will provide 1900 kcal, 90 grams of protein, and 914 mL free water.  - Will maintain order for 100 mL free water TID.    NUTRITION DIAGNOSIS:   Increased nutrient needs related to post-op healing as evidenced by estimated needs. -ongoing  GOAL:   Patient will meet greater than or equal to 90% of their needs -unable to be met with TF currently on hold.   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 Presents this admission for robotic assisted bilateral salpingo-oophorectomy omentectomy interval debulking.   Significant Events: 8/22:ex lap with infra-gastric omentectomy bilateral salpingo-oophorectomy radical optimal tumor debulking, resection of anterior abdominal wall 8/24:remained somnolent and was confused, noted to have post-op UTI 8/26:remained confused and having chest pain; transferred to SDU for Cardene drip; x-ray showed free air and CT abd concerning for ischemic bowel, bowel pneumatosis, and large amount of pneumoperitoneum; repeat ex lap with washout. 8/27:triple lumen PICC placed. 8/28:TPN initiated; ILE hold x7 days; received Granix per Oncology. 9/4:trickle TF started via NGT. 9/5:TPN weaned off. 9/6:TF rate advanced to 50 mL/hr(goal rate). 9/9:TF placed on hold d/t concern for microperf 9/10:TF restarted at 50 mL/hr.  9/12: tracheostomy placed.   Weight has been more stable over the past 10 days. Estimated nutrition needs updated as patient now on trach collar during the day, still needing vent support  at night. Per review of notes, patient vomited and NGT came most of the way out 9/16 around 2:15 AM. NGT was re-advanced and abdominal x-ray obtained with results report stating that "nasogastric tube is present with tip in the stomach and side port in the stomach." NGT has remained clamped since that time.   Spoke with RN who reports that provider is in the OR and plan for TF order to be placed once she is out of OR and able to talk with PA about re-starting TF. Recommendations outlined above.    Medications reviewed; 40 mg IV Lasix/day, sliding scale Novolog, 4 units Novolog every 4 hours, 2 g IV Mg sulfate x1 run yesterday, daily multivitamin with minerals, 40 mg Protonix per NGT BID, 10 mEq IV KCl x3 runs today, 30 mEq KCl per NGT BID. Labs reviewed; CBG: 105 and 107 mg/dL today, Na: 147 mmol/L, K: 3.2 mmol/L, Ca: 8.6 mg/dL.    Diet Order:   Diet Order    None      EDUCATION NEEDS:   Education needs have been addressed  Skin:  Skin Assessment: Skin Integrity Issues: Skin Integrity Issues:: Unstageable Unstageable: full thickness to R ear Incisions: abdominal (8/26)  Last BM:  9/17  Height:   Ht Readings from Last 1 Encounters:  01/31/18 '5\' 3"'$  (1.6 m)    Weight:   Wt Readings from Last 1 Encounters:  02/22/18 60.4 kg    Ideal Body Weight:  52.3 kg  BMI:  Body mass index is 23.59 kg/m.  Estimated Nutritional Needs:   Kcal:  1750-1925 (30-33 kcal/kg)  Protein:  82-93 grams (1.4-1.6 grams/kg)  Fluid:  >/= 1.5 L/day  Jo Matin, MS, RD, LDN, [facility] In[facility] Clinical Dietitian Pager # [phone] After hours/weekend pager # [phone]

## 2018-02-22 NOTE — Progress Notes (Signed)
[facility] Progress Note Patient Name: Jo Parker DOB: 10/28/1945 MRN: [id number]   Date of Service  02/22/2018  HPI/Events of Note  K+ = 3.2 and Creatinine = 0.47. Already on scheduled K+ replacement.   eICU Interventions  Will order additional K+ replacement.      Intervention Category Major Interventions: Electrolyte abnormality - evaluation and management  Sacha Radloff [place] 02/22/2018, 6:39 AM

## 2018-02-22 NOTE — Progress Notes (Signed)
PROGRESS NOTE    Jo Parker  XBM:[id number] DOB: [birth year] DOA: 01/27/2018 PCP: [name], MD  Brief Narrative: 72 year old African-American female admitted 8/22 2019 under GYN service with a diagnosis of ovarian adenocarcinoma status post chemotherapy and bilateral salpingo-oophorectomy and omentectomy with debulking on 01/27/18. On 38 CT scan concerning for perforation with free air started on imipenem went for exploratory laparotomy with mesh washout no significant evidence of bowel perforation was noted patient was intubated. Patient failed extubation trials and she had a trach placed on 02/17/2018. She was seen by GI for anemia recommended PPI no GI evaluation was done. Patient continues to be febrile with a normal white count pressors weaned off. TRH called back to consult 02/19/2018. 02/22/18-had ileus TF had to be on hold. Assessment & Plan:   Principal Problem:   Pelvic abscess in female Active Problems:   Primary peritoneal carcinomatosis ([place])   Severe protein-calorie malnutrition ([place])   Severe sepsis ([name])   COPD (chronic obstructive pulmonary disease) ([name])   HLD (hyperlipidemia)   Absolute anemia   White coat syndrome with hypertension   Ovarian cancer ([place])   AKI (acute kidney injury) ([place])   Acute metabolic encephalopathy   Acute respiratory distress   Low urine output   Somnolence   At risk for fluid volume overload   Free intraperitoneal air   Acute on chronic respiratory failure ([name])   Pressure injury of skin   Status post bilateral salpingo-oophorectomy   Peritonitis ([place])   HCAP (healthcare-associated pneumonia)   Postoperative wound infection   Aspiration pneumonia ([place])  #1 aspiration pneumonia/ARDS status post trach 02/17/2018.chest x-ray 8 02/20/2018 hazy opacification of the right lung and left base.  Lasix decreased to 40 mg daily.  chest xary  esophageal tube tip in the left upper quadrant over the proximal stomach no change in left lung  base consolidation dilated air-filled bowel in the upper abdomen #2 status post exploratory laparotomy and bilateral salpingo-oophorectomy omentectomy and debulking developed fluid collections now withJP drainin place #3 sepsis with aspiration pneumonia ARDS and peritonitis secondary to microperforation with ongoing fever normal white count off pressors today 02/20/2018 #4 hypertension-on lasix #5 previous of bradycardia #6 severe anemia postop anemia transfuse 1 unit packed RBCs9/14 #7 hypokalemia and hypomagnesemia being repletedON LASIX BID.ADD KCL #8 ileus tf on old.  DVT prophylaxis Lovenox Code Status: Full code Family Communication discussed with husband Disposition Plan: TBD  Antimicrobials:vanco Subjective:   Objective: Vitals:   02/22/18 0800 02/22/18 0900 02/22/18 1120 02/22/18 1153  BP: (!) 184/109 (!) 182/93    Pulse: (!) 111 (!) 111    Resp: 11 (!) 24    Temp:    98.5 F (36.9 C)  TempSrc:    Oral  SpO2: 100%  99%   Weight:      Height:        Intake/Output Summary (Last 24 hours) at 02/22/2018 1211 Last data filed at 02/22/2018 1126 Gross per 24 hour  Intake 1549.63 ml  Output 1975 ml  Net -425.37 ml   Filed Weights   02/20/18 0451 02/21/18 0500 02/22/18 0336  Weight: 60.1 kg 60.5 kg 60.4 kg    Examination:  General exam: Appears calm and comfortable  Respiratory system: Coarse breath sounds to auscultation. Respiratory effort normal. Cardiovascular system: S1 & S2 heard, RRR. No JVD, murmurs, rubs, gallops or clicks. No pedal edema. Gastrointestinal system: Abdomen is DISTENDED.JP drain in place. soft and nontender. No organomegaly or masses felt. Normal bowel sounds heard. Central nervous system:  Alert and oriented. No focal neurological deficits. Extremities: Symmetric 5 x 5 power. Skin: No rashes, lesions or ulcers Psychiatry: Judgement and insight appear normal. Mood & affect appropriate.     Data Reviewed: I have personally reviewed  following labs and imaging studies  CBC: Recent Labs  Lab 02/18/18 0414 02/19/18 0548 02/19/18 1836 02/20/18 0416 02/21/18 0323 02/22/18 0326  WBC 9.4 7.8  --  9.2 8.2 7.7  NEUTROABS 6.8 5.2  --  6.4 6.2 5.0  HGB 7.4* 6.9* 8.1* 8.7* 8.5* 8.6*  HCT 23.7* 21.9* 25.2* 27.3* 27.0* 27.7*  MCV 81.2 81.1  --  80.3 80.8 81.7  PLT 302 307  --  332 346 130   Basic Metabolic Panel: Recent Labs  Lab 02/17/18 0500  02/18/18 0414 02/19/18 0548 02/20/18 0416 02/21/18 0323 02/22/18 0326  NA  --    < > 141 142 145 141 147*  K  --    < > 3.0* 2.6* 2.7* 2.8* 3.2*  CL  --    < > 91* 92* 97* 96* 100  CO2  --    < > 42* 40* 38* 37* 36*  GLUCOSE  --    < > 226* 113* 112* 151* 103*  BUN  --    < > _0 CREATININE  --    < > 0.53 0.45 0.41* 0.54 0.47  CALCIUM  --    < > 7.7* 7.7* 8.2* 7.9* 8.6*  MG 1.7  --  1.8 1.6* 1.8 1.7 2.0  PHOS 2.7  --  2.2* 3.3  --  3.5 4.1   < > = values in this interval not displayed.   GFR: Estimated Creatinine Clearance: 52.6 mL/min (by C-G formula based on SCr of 0.47 mg/dL). Liver Function Tests: Recent Labs  Lab 02/20/18 0416 02/21/18 0323 02/22/18 0326  AST _1 ALT _2 ALKPHOS 103 89 84  BILITOT 0.7 0.3 0.6  PROT 6.5 6.5 6.8  ALBUMIN 1.7* 1.6* 1.7*   No results for input(s): LIPASE, AMYLASE in the last 168 hours. No results for input(s): AMMONIA in the last 168 hours. Coagulation Profile: Recent Labs  Lab 02/17/18 0500 02/17/18 0838  INR 1.28 1.28   Cardiac Enzymes: No results for input(s): CKTOTAL, CKMB, CKMBINDEX, TROPONINI in the last 168 hours. BNP (last 3 results) No results for input(s): PROBNP in the last 8760 hours. HbA1C: No results for input(s): HGBA1C in the last 72 hours. CBG: Recent Labs  Lab 02/21/18 1951 02/21/18 2328 02/22/18 0325 02/22/18 0728 02/22/18 1111  GLUCAP 79 109* 105* 107* 100*   Lipid Profile: No results for input(s): CHOL, HDL, LDLCALC, TRIG, CHOLHDL, LDLDIRECT in the last 72  hours. Thyroid Function Tests: No results for input(s): TSH, T4TOTAL, FREET4, T3FREE, THYROIDAB in the last 72 hours. Anemia Panel: No results for input(s): VITAMINB12, FOLATE, FERRITIN, TIBC, IRON, RETICCTPCT in the last 72 hours. Sepsis Labs: No results for input(s): PROCALCITON, LATICACIDVEN in the last 168 hours.  Recent Results (from the past 240 hour(s))  Aerobic/Anaerobic Culture (surgical/deep wound)     Status: None   Collection Time: 02/13/18 11:44 AM  Result Value Ref Range Status   Specimen Description JP DRAINAGE  Final   Special Requests NONE  Final   Gram Stain   Final    FEW WBC PRESENT, PREDOMINANTLY PMN MODERATE GRAM NEGATIVE RODS FEW GRAM POSITIVE RODS Performed at [facility], [address] [place], [place] [address]  Culture   Final    RARE ESCHERICHIA COLI MODERATE LACTOBACILLUS SPECIES Standardized susceptibility testing for this organism is not available. MIXED ANAEROBIC FLORA PRESENT.  CALL LAB IF FURTHER IID REQUIRED.    Report Status 02/18/2018 FINAL  Final   Organism ID, Bacteria ESCHERICHIA COLI  Final      Susceptibility   Escherichia coli - MIC*    AMPICILLIN 4 SENSITIVE Sensitive     CEFAZOLIN <=4 SENSITIVE Sensitive     CEFEPIME <=1 SENSITIVE Sensitive     CEFTAZIDIME <=1 SENSITIVE Sensitive     CEFTRIAXONE <=1 SENSITIVE Sensitive     CIPROFLOXACIN <=0.25 SENSITIVE Sensitive     GENTAMICIN <=1 SENSITIVE Sensitive     IMIPENEM <=0.25 SENSITIVE Sensitive     TRIMETH/SULFA <=20 SENSITIVE Sensitive     AMPICILLIN/SULBACTAM <=2 SENSITIVE Sensitive     PIP/TAZO <=4 SENSITIVE Sensitive     Extended ESBL NEGATIVE Sensitive     * RARE ESCHERICHIA COLI  Culture, Urine     Status: None   Collection Time: 02/17/18 12:45 PM  Result Value Ref Range Status   Specimen Description   Final    URINE, CATHETERIZED Performed at [facility] [address], [place], [place] [address]    Special Requests   Final     Immunocompromised Performed at [facility], [place] [address] [place], [place] [address]    Culture   Final    NO GROWTH Performed at [facility], [place] [address], [place], [place] [address]    Report Status 02/18/2018 FINAL  Final  Culture, blood (routine x 2)     Status: None   Collection Time: 02/17/18  1:12 PM  Result Value Ref Range Status   Specimen Description   Final    BLOOD RIGHT ANTECUBITAL Performed at [facility] [address] [place], [place] [address]    Special Requests   Final    BOTTLES DRAWN AEROBIC ONLY Blood Culture adequate volume Performed at [facility] [address], [place], [place] [address]    Culture   Final    NO GROWTH 5 DAYS Performed at [facility], [place] [address] [place], [place] [address]    Report Status 02/22/2018 FINAL  Final  Culture, blood (routine x 2)     Status: None   Collection Time: 02/17/18  1:14 PM  Result Value Ref Range Status   Specimen Description   Final    BLOOD RIGHT ANTECUBITAL Performed at [facility] [address], [place], [place] [address]    Special Requests   Final    BOTTLES DRAWN AEROBIC ONLY Blood Culture adequate volume Performed at [facility] [address], [place], [place] [address]    Culture   Final    NO GROWTH 5 DAYS Performed at [facility], [place] [address] [place], [place] [address]    Report Status 02/22/2018 FINAL  Final  C difficile quick scan w PCR reflex     Status: None   Collection Time: 02/17/18  3:08 PM  Result Value Ref Range Status   C Diff antigen NEGATIVE NEGATIVE Final   C Diff toxin NEGATIVE NEGATIVE Final   C Diff interpretation No C. difficile detected.  Final    Comment: Performed at [facility], [place] [address] [place], [place] [address]         Radiology Studies: Dg Chest 1 View  Result Date: 02/22/2018 CLINICAL DATA:   Abdominal distention, COPD, history of ovarian carcinoma EXAM: CHEST  1 VIEW COMPARISON:  Portable chest x-ray of 02/20/2018 and CT pelvis of 02/14/2017 FINDINGS: Aeration of the lungs has improved slightly. Opacity in the right mid lung remains some of which is due to overlapping tubing. There may be a small left effusion present. Right central venous line tip overlies the lower SVC and tracheostomy remains. Left-sided PICC line tip overlies the expected SVC-RA junction. Heart size is stable. Loop recorder remains. NG tube extends below the hemidiaphragm. IMPRESSION: 1. Slightly improved aeration. 2. Opacity remains primarily in the right mid lung. 3. Tracheostomy appears to remain in good position. Electronically Signed   By: Ivar Drape M.D.   On: 02/22/2018 09:14   Dg Chest 1 View  Result Date: 02/20/2018 CLINICAL DATA:  Failed extubation with tracheostomy placed. Recent surgery 01/27/2018 for ovarian carcinoma. EXAM: CHEST  1 VIEW COMPARISON:  02/18/2018 FINDINGS: Tracheostomy in adequate position. Enteric tube courses into the stomach and off the inferior portion of the film. Right IJ Port-A-Cath unchanged. Left-sided PICC line unchanged. Cardiac implant device left chest wall unchanged. Lungs are adequately inflated demonstrate persistent hazy opacification over the right lung and left base without significant change. Cardiomediastinal silhouette and remainder of the exam is unchanged. IMPRESSION: Hazy opacification over the right lung and left base unchanged. Multiple tubes and lines as described. Electronically Signed   By: Marin Olp M.D.   On: 02/20/2018 19:06   Dg Abd 1 View  Result Date: 02/22/2018 CLINICAL DATA:  Abdominal distention EXAM: ABDOMEN - 1 VIEW COMPARISON:  02/21/2018 FINDINGS: NG tube coils in the fundus of the stomach. Diffuse gaseous distention of bowel again noted, favor ileus. Overall degree of gaseous distention similar to prior study. Drainage catheter again noted within  the pelvis, unchanged. No visible free air or organomegaly. IMPRESSION: Continued diffuse gaseous distention of bowel, stable, favor ileus. Electronically Signed   By: Rolm Baptise M.D.   On: 02/22/2018 09:11   Dg Abd 1 View  Result Date: 02/21/2018 CLINICAL DATA:  Vomiting and abdominal distention EXAM: ABDOMEN - 1 VIEW COMPARISON:  02/21/2018 FINDINGS: A nasogastric tube is present with tip in the stomach and side port in the stomach. Curvilinear tubing projects over the pelvis. Gaseous prominence of large bowel and likely small bowel loops. Aortoiliac atherosclerotic vascular disease. Asymmetric degenerative arthropathy of the left hip. IMPRESSION: 1. Mild gaseous prominence of loops of large and small bowel, nonspecific for ileus versus distal obstruction. 2. Drainage tubing in the pelvis. 3.  Aortic Atherosclerosis (ICD10-I70.0). 4. Asymmetric degenerative arthropathy left hip. 5. Nasogastric tube noted in the stomach. Electronically Signed   By: Van Clines M.D.   On: 02/21/2018 19:26   Dg Abd Portable 1v  Result Date: 02/21/2018 CLINICAL DATA:  NG tube placement EXAM: PORTABLE ABDOMEN - 1 VIEW COMPARISON:  02/20/2018, 02/19/2018 FINDINGS: Tracheostomy tube tip is about 5 cm superior to carina. Right-sided central venous catheter tip over the SVC. Left-sided central venous catheter tip over the cavoatrial region. Esophageal tube tip in the left upper quadrant over the proximal stomach. No change in left base consolidation. Gas-filled dilated bowel in the upper abdomen. Opacity overlying the port reservoir at the right mid chest may be due to summation of external support devices. IMPRESSION: 1. Esophageal tube tip in the left upper quadrant over the proximal stomach 2. No change in left lung base consolidation 3. Dilated air filled bowel in the upper abdomen  Electronically Signed   By: Donavan Foil M.D.   On: 02/21/2018 03:09        Scheduled Meds: . chlorhexidine gluconate (MEDLINE  KIT)  15 mL Mouth Rinse BID  . citalopram  20 mg Per Tube Daily  . enoxaparin (LOVENOX) injection  40 mg Subcutaneous Q24H  . free water  100 mL Per Tube Q8H  . furosemide  40 mg Intravenous Daily  . insulin aspart  0-20 Units Subcutaneous Q4H  . insulin aspart  4 Units Subcutaneous Q4H  . ipratropium-albuterol  3 mL Nebulization TID  . mouth rinse  15 mL Mouth Rinse 10 times per day  . multivitamin with minerals  1 tablet Oral Daily  . pantoprazole sodium  40 mg Per Tube BID  . potassium chloride  30 mEq Per Tube BID  . QUEtiapine  25 mg Per Tube Daily  . QUEtiapine  50 mg Oral QHS  . sodium chloride flush  10-40 mL Intracatheter Q12H   Continuous Infusions: . sodium chloride 10 mL/hr at 02/21/18 1626  . sodium chloride Stopped (02/21/18 1246)  . ampicillin-sulbactam (UNASYN) IV Stopped (02/22/18 4599)  . anidulafungin 100 mg (02/22/18 1126)  . feeding supplement (VITAL AF 1.2 CAL) Stopped (02/21/18 1817)  . vancomycin       LOS: 26 days     Georgette Shell, MD [facility]  If 7PM-7AM, please contact night-coverage [email or web address] Password [email or web address] 02/22/2018, 12:11 PM

## 2018-02-22 NOTE — Progress Notes (Signed)
Gynecologic Oncology Progress Note  72 year old female diagnosed with Stage IV ovarian cancer who received four cycles of neoadjuvant chemotherapy with carboplatin and taxol with fourth cycle completed on 12/20/17 .  She underwent interval debulking surgery with Dr. Skeet Latch on 01/27/18 consisting of an exploratory laparotomy infra-gastric omentectomy bilateral salpingo-oophorectomy radical optimal tumor debulking, resection of anterior abdominal wall.  Her post-operative course was complicated by findings to suggest bowel perforation on CT from 01/31/18 post-op with no site visualized on re-operation. It was felt that aspiration most likely occurred during intubation with the re-operation.          Subjective: Patient alert. Waving when entering the room.  Husband at the bedside sleeping.     Objective: Vital signs in last 24 hours: Temp:  [98.9 F (37.2 C)-100.6 F (38.1 C)] 99.4 F (37.4 C) (09/17 0751) Pulse Rate:  [101-121] 111 (09/17 0900) Resp:  [11-26] 24 (09/17 0900) BP: (110-184)/(54-148) 182/93 (09/17 0900) SpO2:  [96 %-100 %] 100 % (09/17 0800) FiO2 (%):  [28 %-30 %] 28 % (09/17 0750) Weight:  [133 lb 2.5 oz (60.4 kg)] 133 lb 2.5 oz (60.4 kg) (09/17 0336) Last BM Date: 02/22/18  Intake/Output from previous day: 09/16 0701 - 09/17 0700 In: 1389.6 [I.V.:108.7; NG/GT:525; IV Piggyback:756] Out: 2160 [Urine:2075; Emesis/NG output:75; Drains:10]  Physical Examination: General: alert, appears comfortable, nodding head when asked questions Cardio: Tachycardic at 110 bpm. Resp: Coarse breath sounds.  Trach in place. GI: Abdomen distended with active bowel sounds.  NG to suction. Flexiseal in place with no stool noted in tubing or bag. Stool- c diff neg.  Dressing changed with difficulty.  Granulation tissue noted on the fascia and the wound bed.  Packing with saline dampened kerlix.  LLQ JP stripped with thick, tan colored output (around 5cc in the drain).  There is no leaking around  the tubing entry site. Prior JP sites are healing with no drainage or erythema. Upper aspect of the incision with steri strips in place. Extremities: generalized edema improving, non-pitting.  Port a cath location with bandaid in place. Temporal wasting less aparent.    Labs: WBC/Hgb/Hct/Plts:  7.7/8.6/27.7/372 (09/17 5366) BUN/Cr/glu/ALT/AST/amyl/lip:  15/0.47/--/23/29/--/-- (09/17 0326)  Assessment: 72 y.o. s/p Procedure(s): Interval debulking surgery with Dr. Skeet Latch on 01/27/18 consisting of an exploratory laparotomy infra-gastric omentectomy bilateral salpingo-oophorectomy radical optimal tumor debulking, resection of anterior abdominal wall.  Reoperation of EXPLORATORY LAPAROTOMY, ABDOMINAL WASHOUT on 01/31/18:   Heme:  Post-operative anemia: Hgb 8.6 and Hct 27.7 this am. Last transfusion 02/19/18.  Anemia related to acute critical illness, no evidence of active bleeding.   ID: Fever as high as 100.6 on 02/21/18 but highest recorded at 99.6 early this am. Urine for culture with no growth and sputum culture ordered. Stool for c diff negative. Blood cultures drawn 02/17/18 with no growth <24 hrs.  Presumed sepsis initially from gastrointestinal source but unable to identify the perforation after exp lap on 01/31/18. Contribution now from pneumonia and possibly undrained left pelvic fluid collection seen on CT scan 02/07/18; however the pelvic collection is improving. 02/13/18 enteric versus old hematoma appearing contents in JP. Cultures showing FEW WBC PRESENT, PREDOMINANTLY PMN MODERATE GRAM NEGATIVE RODS FEW GRAM POSITIVE RODS.   Repeat CT 02/14/18: Similar to slight decrease in size of ill-defined complex fluid and gas in the lateral left hemipelvis. 2. No evidence of contrast communication between the thickened sigmoid and this pelvic sidewall process. 3. Foley catheter and air within the urinary bladder, which is not  decompressed. Correlate with catheter function.   WBC 7.7 this am. Granix received  02/01/18 per Dr. Alvy Bimler.  Vancomycin stopped on 02/04/18 and then fevers began again. Restarted Vancomycin on 02/06/18. Anidulafungin initiated 02/01/18.  Meropenum IV started 01/31/18 and discontinued on 02/16/18.  Unasyn started 02/16/18.  Blood cultures from 01/31/18 & 02/06/18 &  02/17/18 with no growth. Tracheal aspirate from 02/01/18 resulting few gram positive rods. Repeated 02/06/18 showed few yeast.  IR feels no safe window for drainage of pelvic fluid collection.  Repeat imaging showed improvement.  CV: Tachycardia managed per Hospitalist. Telemetry, diuresis-daily IV lasix ordered, lopressor/labetalol/hydralazine ordered PRN.  Resp: ARDS in setting of aspiration pneumonia and sepsis.   Chest 1 view 02/22/18: 1. Slightly improved aeration. 2. Opacity remains primarily in the right mid lung. 3. Tracheostomy appears to remain in good position. - Tracheostomy placed 02/17/18. Plan per CC- ATC during the day and rest on PRVC overnight.  GI: Tube feedings on hold as of evening 9/16 due to moderate abdominal distension and episode of emesis.  -Abd 1 view from this am 9/17: IMPRESSION: Continued diffuse gaseous distention of bowel, stable, favor ileus. -Abd 1 view from 9/16  IMPRESSION: 1. Mild gaseous prominence of loops of large and small bowel, nonspecific for ileus versus distal obstruction. 2. Drainage tubing in the pelvis. 3.  Aortic Atherosclerosis (ICD10-I70.0). 4. Asymmetric degenerative arthropathy left hip. 5. Nasogastric tube noted in the stomach.  -C diff negative. Flexiseal in place. -CT AP on 01/31/18 with findings consistent with GI perforation.  No obvious perforation identified on re-operation on 01/31/18. -TPN initiated 02/02/18 and discontinued 02/10/18. -02/13/18 -Enteric versus old hematoma contents in LLQ JP.  -CT pelvis 02/14/18: Stable to slight decrease in pelvic fluid collection, no evidence of contrast communication. -JP fluid culture from 02/13/18:  FEW WBC PRESENT, PREDOMINANTLY  PMN  MODERATE GRAM NEGATIVE RODS FEW GRAM POSITIVE RODS  GU: AKI post-op resolved. Purewick in place to suction- 150 cc of dark yellow urine in canister. Creatinine 0.47 this am.   FEN: Replacement of K+ per CC- K+ 3.2, Phos 4.1, Mag 2.0 this am  Prophylaxis: SCDs in place. Continue Lovenox prophylaxis. No active bleeding.  Plan: Continue care per Hospitalist Team, CC, ID, and Dr. Denman George. Continue wet to dry dressing changes twice daily.    Since pt having BMs (2 early this am) and minimal output from NG (75cc documented since placed on suction), plan to resume tube feedings per Dr. Gweneth Fritter D Iram Lundberg 02/22/2018, 10:37 AM

## 2018-02-22 NOTE — Progress Notes (Signed)
Pharmacy Antibiotic Note  Jo Parker is a 72 y.o. female admitted on 01/27/2018 with hx of ovarian cancer for bilateral salpingo-oophorectomy and omentectomy with debulking on 8/22.  She developed suspected intra-abdominal infection.  CT abdomen results worrisome for ischemic bowel with portal venous gas, bowel pneumatosis, and large amount of pneumoperitoneum without clear source of perforation.  Pharmacy has been consulted for meropenem dosing on 8/26 and added vancomycin on 8/27 for pneumonia. Vancomycin d/c'ed 8/30, but resumed 9/1 for new fevers. Anidulafungin added for empiric coverage given concern for intra-abdominal infection, ongoing fever. Meropenem>>Unasyn on 9/11.   Today, 02/22/18:  Total abx Day #23  ID plans on 28 days of total abx tx  D#6 Unasyn  D#23 anidulafungin  D#17 resumed vanc  Tmax 100.6, WBC WNL, SCr low/stable  Plan per ID:  Continue Unasyn 3 gm IV q6h  Continue Anidulafungin 100mg  IV q24h  Increase to Vancomycin 1000 mg IV q24  Follow up renal fxn, culture results, clinical course, ID recommendations  Vancomycin levels as needed, at least weekly  Height: 5\' 3"  (160 cm) Weight: 133 lb 2.5 oz (60.4 kg) IBW/kg (Calculated) : 52.4  Temp (24hrs), Avg:99.9 F (37.7 C), Min:98.9 F (37.2 C), Max:100.6 F (38.1 C)  Recent Labs  Lab 02/16/18 1122 02/16/18 1401  02/18/18 0414 02/19/18 0548 02/20/18 0416 02/21/18 0323 02/21/18 1403 02/22/18 0326  WBC  --   --    < > 9.4 7.8 9.2 8.2  --  7.7  CREATININE  --   --    < > 0.53 0.45 0.41* 0.54  --  0.47  VANCOTROUGH 12*  --   --   --   --   --   --   --   --   VANCOPEAK  --  49*  --   --   --   --   --  140*  --    < > = values in this interval not displayed.    Estimated Creatinine Clearance: 52.6 mL/min (by C-G formula based on SCr of 0.47 mg/dL).    Allergies  Allergen Reactions  . Oxycodone     confusion   Antimicrobials this admission: 8/26 Meropenem >> 9/11 8/27 Vancomycin >>8/30,  resume 9/1 >>  8/27 Anidulafungin >>  9/11 unasyn >>  Dose adjustments this admission: 9/4 Vanc peak @ 1440 = 36 mcg/mL, 9/5 Vanc trough @ 1200 = 9 mcg/mL --> AUC 485, no change 9/11 Vanc dose at 1200, Vanc trough at 1122 = 12, Vanc trough at 1400 = 49 - calculated AUC of 652 using 1g q24 - would decrease to 750mg  q24 for AUC of 489 (Pk 39 and Tr 8.6) 9/16 Vancomycin 750 mg dose at 1200, Peak of 140 at 1400 (suspect sample was contaminated), Trough of 7 at 11:30 on 9/17.  Unable to calculate accurate AUC with contaminated peak level.  Due to low trough of 7, increase back to Vancomycin 1g IV q24h.  Microbiology results: 8/24 UCx: NGF 8/25 MRSA PCR: negative 8/26 BCx: NGF 8/27 Trach asp: few candida albicans 8/31 Intraperitoneal drain#3: NGF 9/1 BCx (x1): NGF 9/1 Trach asp: few candida albicans, few candida glabrata  9/1 UCx: NGF 9/5 abdomen: culture reincubated for better growth - few lactobacillus 9/8 JP drain: Rare Ecoli (pansensitive) and moderate lactobacillus species (not sent testing) 9/12 C diff: Ag neg, toxin neg 9/12 BCx2: ngtd 9/12 Ucx: NGF 9/12 sputum: ordered  Thank you for allowing pharmacy to be a part of this patient's care.  Altha Harm  Gerardine Peltz PharmD, BCPS Pager [phone] 02/22/2018 7:06 AM

## 2018-02-22 NOTE — Progress Notes (Signed)
  Speech Language Pathology Treatment: [name] Speaking valve  Patient Details Name: Jo Parker MRN: [id number] DOB: May 12, 1946 Today's Date: 02/22/2018 Time: 1420-1450 SLP Time Calculation (min) (ACUTE ONLY): 30 min  Assessment / Plan / Recommendation Clinical Impression  Pt slowly making progress with pmsv trials.  Today with max encouragement she was able to demonstrate upper airway patency by blowing on a mirror.  She is grossly weak however and fatigues very quickly.  PMSV placed for approximately 5 minutes - with max efforts for speech/voice.  She is able to state "I love you" - required ear up against her mouth to hear her adequately.  Max cues for timing voicing on exhalation required.  Pt did smile when she was able to voice simple things to her family.  She did not demonstrate breath stacking but became very sleepy/fatigued during session and closed her eyes.  She admitted she was fatigued and it was not pain medication side effect.  Will continue PMSV trials with SLP only- RN informed.  Pt's son, spouse and sister present during session.  Education ongoing.       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  She is completed 3 cycles of chemotherapy and is now status post bilateral salpingo-oophorectomy and omentectomy with debulking on 8/22.   Post op complication for possible bowel obstruction and presumed aspiration during intubation.   Acute on chronic hypoxemic respiratory failure requiring prolonged intubation (Pt was intubated 8/26 and tracheostomy was placed 9/14) and now tracheostomy, tolerating TCT and Vent support at night.  Order received for PMSV trial.   Pt on 28% trach collar      SLP Plan  Continue with current plan of care       Recommendations         Patient may use Passy-Muir Speech Valve: with SLP only         Oral Care Recommendations: Oral care QID Follow up Recommendations: None SLP  Visit Diagnosis: Aphonia (R49.1) Plan: Continue with current plan of care       GO                Macario Golds 02/22/2018, 4:26 PM Luanna Salk, [facility] SLP Acute Rehab Services Pager [phone] Office [phone]

## 2018-02-22 NOTE — Progress Notes (Signed)
PO medications administered at 2132, NG tube clamped. Unclamped now at 2345. Milky drainage noted from NG tube. Pt appears to be in no distress at this time, does not endorse nausea. Will continue to closely monitor.

## 2018-02-22 NOTE — Progress Notes (Signed)
[facility] Progress Note Patient Name: Jo Parker DOB: 1945/11/05 MRN: [id number]   Date of Service  02/22/2018  HPI/Events of Note  Increasing abdominal distention on "trickle" enteral nutrition.   eICU Interventions  Will order: 1. Hold enteral nutrition.  2. NGT to LIS except when given medications per tube.  3. Rounding team to reassess in AM.     Intervention Category Major Interventions: Other:  Lysle Dingwall 02/22/2018, 11:38 PM

## 2018-02-22 NOTE — Care Management (Addendum)
[id number] Jerelene Salaam,BSN,RN3,CCM:[phone]/LTACH stay denied by [facility] as to stable will see if md wants to do p2p at [phone] ext [phone]. 1035/spoke with dr. Valeta Harms and Consuello Masse will do p2p.

## 2018-02-22 NOTE — Evaluation (Signed)
Physical Therapy Evaluation Patient Details Name: Jo Parker MRN: [id number] DOB: Oct 13, 1945 Today's Date: 02/22/2018   History of Present Illness  72 yo female with history of COPD,MVP,anxiety/depression,ovarian cancer, admitted  01/27/18 with pelvic abcess. s/P  interval debulking surgery with Dr. Skeet Latch on 01/27/18 consisting of an exploratory laparotomy infra-gastric omentectomy bilateral salpingo-oophorectomy radical optimal tumor debulking, resection of anterior abdominal wall, complicated by findings to suggest bowel perforation on CT from 8/26/  required reexploration with VDRF due to aspiration. Tracheostomy on 02/17/18 . Currently on  Trach collar days and vent at night.   Clinical Impression  The patient  Is awake and participate in AAROM. Did not  Attempt to  Sit up. BP 160/100's. RN notified. HR 14-17. Patient is very weak. Will progress activity as tolerated. Recommend OT. Pt admitted with above diagnosis. Pt currently with functional limitations due to the deficits listed below (see PT Problem List).  Pt will benefit from skilled PT to increase their independence and safety with mobility to allow discharge to the venue listed below.        Follow Up Recommendations LTACH    Equipment Recommendations  (tbd)    Recommendations for Other Services   OT    Precautions / Restrictions Precautions Precaution Comments: JP drain, Trach collar, Flexiseal. profound weakness from long term vent.      Mobility  Bed Mobility               General bed mobility comments: NT due to length of time in hospital  Transfers                    Ambulation/Gait                Stairs            Wheelchair Mobility    Modified Rankin (Stroke Patients Only)       Balance                                             Pertinent Vitals/Pain Pain Assessment: Faces Faces Pain Scale: Hurts even more Pain Location: holds abdomen at  times Pain Descriptors / Indicators: Discomfort;Guarding Pain Intervention(s): Limited activity within patient's tolerance;Monitored during session    Home Living Family/patient expects to be discharged to:: Other (Comment)(LTAC) Living Arrangements: Spouse/significant other;Children               Additional Comments: pt/spouse live in [place], pt staying with son while doing chemo here in [place]    Prior Function Level of Independence: Independent         Comments: independent walking short distances, ADLs independent but slow and labored     Hand Dominance   Dominant Hand: Right    Extremity/Trunk Assessment   Upper Extremity Assessment Upper Extremity Assessment: Generalized weakness;RUE deficits/detail;LUE deficits/detail RUE Deficits / Details: able to lift arm to top of head, did not perform thumb oposition to fingertips.  LUE Deficits / Details: same as right    Lower Extremity Assessment Lower Extremity Assessment: RLE deficits/detail;LLE deficits/detail RLE Deficits / Details: dorsiflexion 2+, plantarflex+, hip flex 2, knee ext 2+, abd/add 1/5, patient unable to answer for test of sensation LLE Deficits / Details: similar to left    Cervical / Trunk Assessment Cervical / Trunk Assessment: Other exceptions Cervical / Trunk Exceptions: did not test at this time.  Communication   Communication: Tracheostomy  Cognition Arousal/Alertness: Awake/alert Behavior During Therapy: WFL for tasks assessed/performed Overall Cognitive Status: Difficult to assess Area of Impairment: Attention;Orientation;Following commands                   Current Attention Level: Alternating   Following Commands: Follows one step commands inconsistently       General Comments: patient does follow commands inconsistently, requires redirection at times to follow.       General Comments      Exercises     Assessment/Plan    PT Assessment Patient needs  continued PT services  PT Problem List Decreased strength;Decreased range of motion;Decreased activity tolerance;Decreased balance;Decreased mobility;Cardiopulmonary status limiting activity;Pain;Decreased knowledge of precautions;Decreased safety awareness       PT Treatment Interventions DME instruction;Functional mobility training;Therapeutic activities;Therapeutic exercise;Patient/family education    PT Goals (Current goals can be found in the Care Plan section)  Acute Rehab PT Goals Patient Stated Goal: per spouse, for patient to get up amd moving PT Goal Formulation: With patient/family Time For Goal Achievement: 03/08/18 Potential to Achieve Goals: Fair    Frequency Min 2X/week   Barriers to discharge        Co-evaluation               AM-PAC PT "6 Clicks" Daily Activity  Outcome Measure Difficulty turning over in bed (including adjusting bedclothes, sheets and blankets)?: Unable Difficulty moving from lying on back to sitting on the side of the bed? : Unable Difficulty sitting down on and standing up from a chair with arms (e.g., wheelchair, bedside commode, etc,.)?: Unable Help needed moving to and from a bed to chair (including a wheelchair)?: Total Help needed walking in hospital room?: Total Help needed climbing 3-5 steps with a railing? : Total 6 Click Score: 6    End of Session   Activity Tolerance: Patient tolerated treatment well;Patient limited by fatigue Patient left: in bed;with call bell/phone within reach;with bed alarm set;with family/visitor present Nurse Communication: Mobility status;Need for lift equipment PT Visit Diagnosis: Muscle weakness (generalized) (M62.81)    Time: 8115-7262 PT Time Calculation (min) (ACUTE ONLY): 36 min   Charges:   PT Evaluation $PT Eval Moderate Complexity: 1 Mod PT Treatments $Therapeutic Exercise: 8-22 mins        Tresa Endo PT Acute Rehabilitation Services Pager [phone] Office  [phone]   Claretha Cooper 02/22/2018, 3:42 PM

## 2018-02-22 NOTE — Progress Notes (Signed)
Patient ID: Jo Parker, female   DOB: 29-May-1946, 72 y.o.   MRN: [id number]         [facility] for Infectious Disease  Date of Admission:  01/27/2018           Day 23 vancomycin        Day 23 anidulafungin        Day 6 ampicillin sulbactam ASSESSMENT: She is now afebrile and looking better.  I plan on continuing broad empiric antibiotics for 5 more days.  I have reviewed the plan with Jo Parker, her son, [name], and her sister.  PLAN: 1. Continue current antimicrobial therapy  Principal Problem:   Pelvic abscess in female Active Problems:   Severe sepsis ([place])   Peritonitis ([place])   HCAP (healthcare-associated pneumonia)   Postoperative wound infection   Primary peritoneal carcinomatosis ([place])   Ovarian cancer ([place])   Status post bilateral salpingo-oophorectomy   Severe protein-calorie malnutrition ([name])   COPD (chronic obstructive pulmonary disease) ([name])   HLD (hyperlipidemia)   Absolute anemia   White coat syndrome with hypertension   AKI (acute kidney injury) ([place])   Acute metabolic encephalopathy   Acute respiratory distress   Low urine output   Somnolence   At risk for fluid volume overload   Free intraperitoneal air   Acute on chronic respiratory failure ([name])   Pressure injury of skin   Aspiration pneumonia ([name])   Scheduled Meds: . chlorhexidine gluconate (MEDLINE KIT)  15 mL Mouth Rinse BID  . citalopram  20 mg Per Tube Daily  . enoxaparin (LOVENOX) injection  40 mg Subcutaneous Q24H  . free water  100 mL Per Tube Q8H  . furosemide  40 mg Intravenous Daily  . insulin aspart  0-20 Units Subcutaneous Q4H  . insulin aspart  4 Units Subcutaneous Q4H  . ipratropium-albuterol  3 mL Nebulization TID  . mouth rinse  15 mL Mouth Rinse 10 times per day  . multivitamin with minerals  1 tablet Oral Daily  . pantoprazole sodium  40 mg Per Tube BID  . potassium chloride  30 mEq Per Tube BID  . QUEtiapine  25 mg Per Tube Daily  . QUEtiapine  50 mg Oral  QHS  . sodium chloride flush  10-40 mL Intracatheter Q12H   Continuous Infusions: . sodium chloride 10 mL/hr at 02/21/18 1626  . sodium chloride Stopped (02/21/18 1246)  . ampicillin-sulbactam (UNASYN) IV Stopped (02/22/18 1300)  . anidulafungin Stopped (02/22/18 1306)  . feeding supplement (VITAL AF 1.2 CAL) Stopped (02/21/18 1817)  . vancomycin 1,000 mg (02/22/18 1400)   PRN Meds:.sodium chloride, sodium chloride, acetaminophen (TYLENOL) oral liquid 160 mg/5 mL, fentaNYL (SUBLIMAZE) injection, hydrALAZINE, iopamidol, labetalol, metoprolol tartrate, ondansetron (ZOFRAN) IV, sodium chloride flush  Review of Systems: Review of Systems  Unable to perform ROS: Intubated    Allergies  Allergen Reactions  . Oxycodone     confusion    OBJECTIVE: Vitals:   02/22/18 0900 02/22/18 1120 02/22/18 1153 02/22/18 1446  BP: (!) 182/93     Pulse: (!) 111 (!) 105  (!) 102  Resp: (!) _0 Temp:   98.5 F (36.9 C)   TempSrc:   Oral   SpO2:  99%  98%  Weight:      Height:       Body mass index is 23.59 kg/m.  Physical Exam  Constitutional:  She has been off the ventilator and on trach collar oxygen for the past 24 hours.  She is more alert and sitting up in bed.  She nods appropriately to questions.  Cardiovascular: Normal rate, regular rhythm and normal heart sounds.  No murmur heard. Pulmonary/Chest: She has no wheezes. She has no rales.    Abdominal: Soft. She exhibits no distension. There is no tenderness.  Skin: No rash noted.  Left arm PICC site looks good.    Lab Results Lab Results  Component Value Date   WBC 7.7 02/22/2018   HGB 8.6 (L) 02/22/2018   HCT 27.7 (L) 02/22/2018   MCV 81.7 02/22/2018   PLT 372 02/22/2018    Lab Results  Component Value Date   CREATININE 0.47 02/22/2018   BUN 15 02/22/2018   NA 147 (H) 02/22/2018   K 3.2 (L) 02/22/2018   CL 100 02/22/2018   CO2 36 (H) 02/22/2018    Lab Results  Component Value Date   ALT 23 02/22/2018    AST 29 02/22/2018   ALKPHOS 84 02/22/2018   BILITOT 0.6 02/22/2018     Microbiology: Recent Results (from the past 240 hour(s))  Aerobic/Anaerobic Culture (surgical/deep wound)     Status: None   Collection Time: 02/13/18 11:44 AM  Result Value Ref Range Status   Specimen Description JP DRAINAGE  Final   Special Requests NONE  Final   Gram Stain   Final    FEW WBC PRESENT, PREDOMINANTLY PMN MODERATE GRAM NEGATIVE RODS FEW GRAM POSITIVE RODS Performed at [facility], [place] [address] [place], [place] [address]    Culture   Final    RARE ESCHERICHIA COLI MODERATE LACTOBACILLUS SPECIES Standardized susceptibility testing for this organism is not available. MIXED ANAEROBIC FLORA PRESENT.  CALL LAB IF FURTHER IID REQUIRED.    Report Status 02/18/2018 FINAL  Final   Organism ID, Bacteria ESCHERICHIA COLI  Final      Susceptibility   Escherichia coli - MIC*    AMPICILLIN 4 SENSITIVE Sensitive     CEFAZOLIN <=4 SENSITIVE Sensitive     CEFEPIME <=1 SENSITIVE Sensitive     CEFTAZIDIME <=1 SENSITIVE Sensitive     CEFTRIAXONE <=1 SENSITIVE Sensitive     CIPROFLOXACIN <=0.25 SENSITIVE Sensitive     GENTAMICIN <=1 SENSITIVE Sensitive     IMIPENEM <=0.25 SENSITIVE Sensitive     TRIMETH/SULFA <=20 SENSITIVE Sensitive     AMPICILLIN/SULBACTAM <=2 SENSITIVE Sensitive     PIP/TAZO <=4 SENSITIVE Sensitive     Extended ESBL NEGATIVE Sensitive     * RARE ESCHERICHIA COLI  Culture, Urine     Status: None   Collection Time: 02/17/18 12:45 PM  Result Value Ref Range Status   Specimen Description   Final    URINE, CATHETERIZED Performed at [facility] [address], [place], [place] [address]    Special Requests   Final    Immunocompromised Performed at [facility], [place] [address], [place], [place] [address]    Culture   Final    NO GROWTH Performed at [facility], [place] [address] [place], [place] [address]    Report Status  02/18/2018 FINAL  Final  Culture, blood (routine x 2)     Status: None   Collection Time: 02/17/18  1:12 PM  Result Value Ref Range Status   Specimen Description   Final    BLOOD RIGHT ANTECUBITAL Performed at [facility] [address], [place], [place] [address]    Special Requests   Final    BOTTLES  DRAWN AEROBIC ONLY Blood Culture adequate volume Performed at [facility] [address] [place], [place] [address]    Culture   Final    NO GROWTH 5 DAYS Performed at [facility], [name] [address] [place], [place] [address]    Report Status 02/22/2018 FINAL  Final  Culture, blood (routine x 2)     Status: None   Collection Time: 02/17/18  1:14 PM  Result Value Ref Range Status   Specimen Description   Final    BLOOD RIGHT ANTECUBITAL Performed at [facility] [address] [place], [place] [address]    Special Requests   Final    BOTTLES DRAWN AEROBIC ONLY Blood Culture adequate volume Performed at [facility] [address], [place],  [place] [address]    Culture   Final    NO GROWTH 5 DAYS Performed at [facility], [place] [address], [place], [place] [address]    Report Status 02/22/2018 FINAL  Final  C difficile quick scan w PCR reflex     Status: None   Collection Time: 02/17/18  3:08 PM  Result Value Ref Range Status   C Diff antigen NEGATIVE NEGATIVE Final   C Diff toxin NEGATIVE NEGATIVE Final   C Diff interpretation No C. difficile detected.  Final    Comment: Performed at [facility], [place] [address], [place], [place] [address]    Michel Bickers, [facility] [phone] pager   [phone] cell 02/22/2018, 3:00 PM

## 2018-02-22 NOTE — Progress Notes (Signed)
Requested that PO meds be switched over to IV since NG tube is to LIS now. CCM called back, instructed to give meds with minimal flushes and leave NG tube clamped for 2 hrs after med administration. Instructed to continue to hold tube feeds and free water flushes.

## 2018-02-22 NOTE — Progress Notes (Signed)
[facility] Progress Note Patient Name: Jo Parker DOB: 1945/07/02 MRN: [id number]   Date of Service  02/22/2018  HPI/Events of Note  Blood glucose = 84. Enteral feeds at slower rate d/t abdominal distention. Also on extra Novolog dose of 4 units.  eICU Interventions  Will order: 1. Will D/C the extra 4 units of Novolog coverage Q 4 hours until enteral feed rate increased.      Intervention Category Major Interventions: Hyperglycemia - active titration of insulin therapy  Sommer,Steven Eugene 02/22/2018, 8:20 PM

## 2018-02-23 ENCOUNTER — Inpatient Hospital Stay (HOSPITAL_COMMUNITY): Payer: Medicare PPO

## 2018-02-23 LAB — COMPREHENSIVE METABOLIC PANEL
ALT: 24 U/L (ref 0–44)
AST: 26 U/L (ref 15–41)
Albumin: 1.9 g/dL — ABNORMAL LOW (ref 3.5–5.0)
Alkaline Phosphatase: 91 U/L (ref 38–126)
Anion gap: 11 (ref 5–15)
BILIRUBIN TOTAL: 0.6 mg/dL (ref 0.3–1.2)
BUN: 14 mg/dL (ref 8–23)
CALCIUM: 8.7 mg/dL — AB (ref 8.9–10.3)
CO2: 35 mmol/L — ABNORMAL HIGH (ref 22–32)
Chloride: 99 mmol/L (ref 98–111)
Creatinine, Ser: 0.54 mg/dL (ref 0.44–1.00)
GFR calc non Af Amer: >60 mL/min (ref >60)
Glucose, Bld: 119 mg/dL — ABNORMAL HIGH (ref 70–99)
POTASSIUM: 3.2 mmol/L — AB (ref 3.5–5.1)
Sodium: 145 mmol/L (ref 135–145)
TOTAL PROTEIN: 7.6 g/dL (ref 6.5–8.1)

## 2018-02-23 LAB — CBC WITH DIFFERENTIAL/PLATELET
BASOS PCT: 0 %
Basophils Absolute: 0 10*3/uL (ref 0.0–0.1)
Eosinophils Absolute: 0.1 10*3/uL (ref 0.0–0.7)
Eosinophils Relative: 1 %
HCT: 31.5 % — ABNORMAL LOW (ref 36.0–46.0)
HEMOGLOBIN: 10 g/dL — AB (ref 12.0–15.0)
LYMPHS ABS: 1.9 10*3/uL (ref 0.7–4.0)
Lymphocytes Relative: 17 %
MCH: 25.6 pg — AB (ref 26.0–34.0)
MCHC: 31.7 g/dL (ref 30.0–36.0)
MCV: 80.8 fL (ref 78.0–100.0)
Monocytes Absolute: 1 10*3/uL (ref 0.1–1.0)
Monocytes Relative: 9 %
NEUTROS ABS: 8 10*3/uL — AB (ref 1.7–7.7)
Neutrophils Relative %: 73 %
Platelets: 379 10*3/uL (ref 150–400)
RBC: 3.9 MIL/uL (ref 3.87–5.11)
RDW: 22.3 % — AB (ref 11.5–15.5)
WBC: 11 10*3/uL — ABNORMAL HIGH (ref 4.0–10.5)

## 2018-02-23 LAB — GLUCOSE, CAPILLARY
GLUCOSE-CAPILLARY: 108 mg/dL — AB (ref 70–99)
GLUCOSE-CAPILLARY: 96 mg/dL (ref 70–99)
Glucose-Capillary: 111 mg/dL — ABNORMAL HIGH (ref 70–99)
Glucose-Capillary: 115 mg/dL — ABNORMAL HIGH (ref 70–99)
Glucose-Capillary: 140 mg/dL — ABNORMAL HIGH (ref 70–99)

## 2018-02-23 LAB — PHOSPHORUS: PHOSPHORUS: 4 mg/dL (ref 2.5–4.6)

## 2018-02-23 LAB — MAGNESIUM: Magnesium: 1.6 mg/dL — ABNORMAL LOW (ref 1.7–2.4)

## 2018-02-23 MED ORDER — IOHEXOL 300 MG/ML  SOLN
100.0000 mL | Freq: Once | INTRAMUSCULAR | Status: AC | PRN
  Administered 2018-02-23: 100 mL via INTRAVENOUS

## 2018-02-23 MED ORDER — MAGNESIUM SULFATE 2 GM/50ML IV SOLN
2.0000 g | Freq: Once | INTRAVENOUS | Status: AC
  Administered 2018-02-23: 2 g via INTRAVENOUS
  Filled 2018-02-23: qty 50

## 2018-02-23 MED ORDER — POTASSIUM CHLORIDE 10 MEQ/100ML IV SOLN
10.0000 meq | INTRAVENOUS | Status: AC
  Administered 2018-02-23 (×3): 10 meq via INTRAVENOUS
  Filled 2018-02-23 (×3): qty 100

## 2018-02-23 MED ORDER — SODIUM CHLORIDE 0.9 % IJ SOLN
INTRAMUSCULAR | Status: AC
  Filled 2018-02-23: qty 50

## 2018-02-23 NOTE — Progress Notes (Signed)
Placed PT back on ventilator at ordered settings due to STAT abdominal CT  Per RN.

## 2018-02-23 NOTE — Progress Notes (Signed)
Received call back from insurance company regarding [facility] candidacy (iniitiated call 9/17).  Reviewed patients current status with insurance MD and she feels the patient continues to not be a LTAC candidate.  However, we can call back again to talk about the case when she better.    Noe Gens, NP-C [facility] Pgr: [phone] or if no answer [phone] 02/23/2018, 2:40 PM

## 2018-02-23 NOTE — Progress Notes (Addendum)
Assisted with transporting PT to [facility] CT- on vent at 100% Fi02- uneventful. RN remains at bedside in [place] upon return from CT- PT is on ordered Vent settings at this time with 30% Fi02.

## 2018-02-23 NOTE — Progress Notes (Signed)
Pt removed from vent and placed on 28% ATC- RN aware.

## 2018-02-23 NOTE — Progress Notes (Signed)
SLP Cancellation Note  Patient Details Name: Jo Parker MRN: [id number] DOB: 1946/04/15   Cancelled treatment:       Reason Eval/Treat Not Completed: Medical issues which prohibited therapy(note events overnight and pt stat CT abdomen as well as being back of vent, will hold off on PMSV trial today, spoke to Mr Varone in person and pt's son [name] via phone, will continue efforts)   Macario Golds 02/23/2018, 2:59 PM  Luanna Salk, [name] SLP [facility] Pager [phone] Office [phone]

## 2018-02-23 NOTE — Progress Notes (Signed)
Pt had a moderate amount of emesis (witnessed by RT at bedside) who quickly assisted pt with suction.  Pt abdomen remains distended.  Pt given zofran iv and notified [facility]-Link, MD.  New orders received and initiated.

## 2018-02-23 NOTE — Progress Notes (Signed)
Once STAT CT results are available- will consider placing PT back on ATC.

## 2018-02-23 NOTE — Progress Notes (Addendum)
Gynecologic Oncology Progress Note  72 year old female diagnosed with Stage IV ovarian cancer who received four cycles of neoadjuvant chemotherapy with carboplatin and taxol with fourth cycle completed on 12/20/17 .  She underwent interval debulking surgery with Dr. Skeet Latch on 01/27/18 consisting of an exploratory laparotomy infra-gastric omentectomy bilateral salpingo-oophorectomy radical optimal tumor debulking, resection of anterior abdominal wall.  Her post-operative course was complicated by findings to suggest bowel perforation on CT from 01/31/18 post-op with no site visualized on re-operation. It was felt that aspiration most likely occurred during intubation with the re-operation.          Subjective: Patient alert. Attempts to answer questions.  Increase in abdominal discomfort. Nodding to respond.  Husband at the bedside.     Objective: Vital signs in last 24 hours: Temp:  [97.4 F (36.3 C)-98.8 F (37.1 C)] 98.7 F (37.1 C) (09/18 0730) Pulse Rate:  [86-125] 125 (09/18 0800) Resp:  [14-27] 17 (09/18 0800) BP: (146-203)/(58-110) 162/66 (09/18 0800) SpO2:  [97 %-100 %] 98 % (09/18 0826) FiO2 (%):  [28 %-30 %] 28 % (09/18 0826) Weight:  [133 lb 2.5 oz (60.4 kg)] 133 lb 2.5 oz (60.4 kg) (09/18 0630) Last BM Date: 02/22/18  Intake/Output from previous day: 09/17 0701 - 09/18 0700 In: 809.9 [I.V.:110; NG/GT:250; IV Piggyback:449.9] Out: 3280 [Urine:3050; Drains:30; Stool:200]  Physical Examination: General: alert, appears comfortable, nodding head when asked questions Cardio: Tachycardic at 110 bpm. Resp: Coarse breath sounds.  Trach in place. GI: Abdomen tight, distended more than 9/17 with active bowel sounds.  Tympanic on percussion. NG to suction with 100 cc yellowish output in canister. Flexiseal in place with light brown liquid stool in bag. Dressing changed without difficulty.  Granulation tissue noted on the fascia and the wound bed.  Packing with saline dampened kerlix.   LLQ JP stripped with thick, tan colored output (around 10cc in the drain).  There is no leaking around the tubing entry site. Prior JP sites are healing with no drainage or erythema. Upper aspect of the incision with steri strips in place. Extremities: generalized edema improving, non-pitting.   Labs: WBC/Hgb/Hct/Plts:  11.0/10.0/31.5/379 (09/18 1194) BUN/Cr/glu/ALT/AST/amyl/lip:  14/0.54/--/24/26/--/-- (09/18 0645)  Assessment: 72 y.o. s/p Procedure(s): Interval debulking surgery with Dr. Skeet Latch on 01/27/18 consisting of an exploratory laparotomy infra-gastric omentectomy bilateral salpingo-oophorectomy radical optimal tumor debulking, resection of anterior abdominal wall.  Reoperation of EXPLORATORY LAPAROTOMY, ABDOMINAL WASHOUT on 01/31/18:   Heme:  Post-operative anemia: Hgb 10.0 and Hct 31.5 this am. Last transfusion 02/19/18.  Anemia related to acute critical illness, no evidence of active bleeding.   ID: Highest temp on 02/22/18 was 99.6. Urine for culture with no growth and stool for c diff negative from 02/17/18. Blood cultures drawn 02/17/18 with no growth.  Presumed sepsis initially from gastrointestinal source but unable to identify the perforation after exp lap on 01/31/18. Contribution now from pneumonia and possibly undrained left pelvic fluid collection seen on CT scan 02/07/18; however the pelvic collection is improving. 02/13/18 enteric versus old hematoma appearing contents in JP. Cultures showing FEW WBC PRESENT, PREDOMINANTLY PMN MODERATE GRAM NEGATIVE RODS FEW GRAM POSITIVE RODS.   Repeat CT 02/14/18: Similar to slight decrease in size of ill-defined complex fluid and gas in the lateral left hemipelvis. 2. No evidence of contrast communication between the thickened sigmoid and this pelvic sidewall process. 3. Foley catheter and air within the urinary bladder, which is not decompressed. Correlate with catheter function.   WBC 11.0 this am. Granix received  02/01/18 per Dr. Alvy Bimler.   Vancomycin stopped on 02/04/18 and then fevers began again. Restarted Vancomycin on 02/06/18. Anidulafungin initiated 02/01/18.  Meropenum IV started 01/31/18 and discontinued on 02/16/18.  Unasyn started 02/16/18.  Blood cultures from 01/31/18 & 02/06/18 &  02/17/18 with no growth. Tracheal aspirate from 02/01/18 resulting few gram positive rods. Repeated 02/06/18 showed few yeast.  IR feels no safe window for drainage of pelvic fluid collection.  Repeat imaging showed improvement from 9/9.  Plan for repeat CT AP today.  CV: Tachycardia managed per Hospitalist. Telemetry, diuresis-daily IV lasix ordered, lopressor/labetalol/hydralazine ordered PRN.  Resp: ARDS in setting of aspiration pneumonia and sepsis.   Chest 1 view 02/22/18: 1. Slightly improved aeration. 2. Opacity remains primarily in the right mid lung. 3. Tracheostomy appears to remain in good position. - Tracheostomy placed 02/17/18. Plan per CC- ATC during the day and rest on PRVC overnight.  GI: Increased abdominal distension- CT AP ordered. Tube feedings on hold as of evening 9/17 due to moderate abdominal distension and episode of emesis. Resumed at a reduced rate yesterday afternoon but placed on hold around 11 pm due to emesis and increased distension. -Abd 1 view from am 9/17: IMPRESSION: Continued diffuse gaseous distention of bowel, stable, favor ileus. -Abd 1 view from 9/16  IMPRESSION: 1. Mild gaseous prominence of loops of large and small bowel, nonspecific for ileus versus distal obstruction. 2. Drainage tubing in the pelvis. 3.  Aortic Atherosclerosis (ICD10-I70.0). 4. Asymmetric degenerative arthropathy left hip. 5. Nasogastric tube noted in the stomach.  -C diff negative. Flexiseal in place. -CT AP on 01/31/18 with findings consistent with GI perforation.  No obvious perforation identified on re-operation on 01/31/18. -TPN initiated 02/02/18 and discontinued 02/10/18. -02/13/18 -Enteric versus old hematoma contents in LLQ JP.  -CT  pelvis 02/14/18: Stable to slight decrease in pelvic fluid collection, no evidence of contrast communication. -JP fluid culture from 02/13/18:  FEW WBC PRESENT, PREDOMINANTLY PMN  MODERATE GRAM NEGATIVE RODS FEW GRAM POSITIVE RODS  GU: AKI post-op resolved. Purewick in place to suction. Creatinine 0.54 this am.   FEN: Replacement of K+ per Hospitalist /CC- K+ 3.2, Phos 4.0, Mag 1.6 this am  Prophylaxis: SCDs in place. Continue Lovenox prophylaxis. No active bleeding.  Plan: CT AP ordered this am to evaluate abdomen with increased abdominal distension, emesis. Ileus favored on 02/22/18 abd xray. OT ordered per PT recommendation.   Continue care per Hospitalist Team, CC, ID, and Dr. Denman George. Continue wet to dry dressing changes twice daily.      Shimon Trowbridge D Theophile Harvie 02/23/2018, 9:55 AM  Patient seen and examined by me. I viewed the CT images and the report. She has had emesis but continues to make stool. CT looks like ileus rather than obstruction. Given distension and intolerance to feeds, would continue to hold tube feeds until less distended. Keep NGT to suction for now. Pelvic collection continuing to improve on sequential imaging. JP fluid continues to look murky. Will keep drain in situ for now.  Thereasa Solo, MD

## 2018-02-23 NOTE — Progress Notes (Signed)
Patient ID: Jo Parker, female   DOB: 19-Aug-1945, 72 y.o.   MRN: [id number]         [facility] for Infectious Disease  Date of Admission:  01/27/2018           Day 24 vancomycin        Day 24 anidulafungin        Day 7 ampicillin sulbactam ASSESSMENT: She has been afebrile for the past 48 hours.  Today's repeat CT scanning shows that her pelvic abscess collection is about half the size it was on last CT scan.  It is now about 7 x 1.8 cm.  PLAN: 1. Continue current antimicrobial therapy  Principal Problem:   Pelvic abscess in female Active Problems:   Severe sepsis ([place])   Peritonitis ([place])   HCAP (healthcare-associated pneumonia)   Postoperative wound infection   Primary peritoneal carcinomatosis ([place])   Ovarian cancer ([place])   Status post bilateral salpingo-oophorectomy   Severe protein-calorie malnutrition ([name])   COPD (chronic obstructive pulmonary disease) ([name])   HLD (hyperlipidemia)   Absolute anemia   White coat syndrome with hypertension   AKI (acute kidney injury) ([place])   Acute metabolic encephalopathy   Acute respiratory distress   Low urine output   Somnolence   At risk for fluid volume overload   Free intraperitoneal air   Acute on chronic respiratory failure ([name])   Pressure injury of skin   Aspiration pneumonia ([name])   Scheduled Meds: . chlorhexidine gluconate (MEDLINE KIT)  15 mL Mouth Rinse BID  . citalopram  20 mg Per Tube Daily  . enoxaparin (LOVENOX) injection  40 mg Subcutaneous Q24H  . free water  100 mL Per Tube Q8H  . furosemide  40 mg Intravenous Daily  . insulin aspart  0-20 Units Subcutaneous Q4H  . ipratropium-albuterol  3 mL Nebulization TID  . mouth rinse  15 mL Mouth Rinse 10 times per day  . multivitamin with minerals  1 tablet Oral Daily  . pantoprazole sodium  40 mg Per Tube BID  . potassium chloride  30 mEq Per Tube BID  . QUEtiapine  25 mg Per Tube Daily  . QUEtiapine  50 mg Oral QHS  . sodium chloride flush  10-40  mL Intracatheter Q12H   Continuous Infusions: . sodium chloride 10 mL/hr at 02/21/18 1626  . sodium chloride Stopped (02/21/18 1246)  . ampicillin-sulbactam (UNASYN) IV Stopped (02/23/18 1234)  . anidulafungin Stopped (02/23/18 1158)  . vancomycin 1,000 mg (02/23/18 1509)   PRN Meds:.sodium chloride, sodium chloride, acetaminophen (TYLENOL) oral liquid 160 mg/5 mL, fentaNYL (SUBLIMAZE) injection, hydrALAZINE, iopamidol, labetalol, metoprolol tartrate, ondansetron (ZOFRAN) IV, sodium chloride flush  Review of Systems: Review of Systems  Unable to perform ROS: Intubated    Allergies  Allergen Reactions  . Oxycodone     confusion    OBJECTIVE: Vitals:   02/23/18 1200 02/23/18 1212 02/23/18 1559 02/23/18 1600  BP: (!) 196/107 (!) 196/107    Pulse: (!) 117     Resp: (!) 24     Temp: 99 F (37.2 C)   99 F (37.2 C)  TempSrc: Oral   Oral  SpO2: 100%  100%   Weight:      Height:       Body mass index is 23.59 kg/m.  Physical Exam  Constitutional:  She is alert and sitting up in bed.  She nods appropriately.  Cardiovascular: Normal rate, regular rhythm and normal heart sounds.  No murmur heard. Pulmonary/Chest: She  has no wheezes. She has no rales.    Abdominal: Soft. She exhibits distension. There is no tenderness.  There is about 10 cc of cloudy fluid in her abdominal drain.  Skin: No rash noted.    Lab Results Lab Results  Component Value Date   WBC 11.0 (H) 02/23/2018   HGB 10.0 (L) 02/23/2018   HCT 31.5 (L) 02/23/2018   MCV 80.8 02/23/2018   PLT 379 02/23/2018    Lab Results  Component Value Date   CREATININE 0.54 02/23/2018   BUN 14 02/23/2018   NA 145 02/23/2018   K 3.2 (L) 02/23/2018   CL 99 02/23/2018   CO2 35 (H) 02/23/2018    Lab Results  Component Value Date   ALT 24 02/23/2018   AST 26 02/23/2018   ALKPHOS 91 02/23/2018   BILITOT 0.6 02/23/2018     Microbiology: Recent Results (from the past 240 hour(s))  Culture, Urine     Status:  None   Collection Time: 02/17/18 12:45 PM  Result Value Ref Range Status   Specimen Description   Final    URINE, CATHETERIZED Performed at [facility], [place] [address] [place], [place] [address]    Special Requests   Final    Immunocompromised Performed at [facility], [place] [address], [place], [place] [address]    Culture   Final    NO GROWTH Performed at [facility], [place] [address] [place], [place] [address]    Report Status 02/18/2018 FINAL  Final  Culture, blood (routine x 2)     Status: None   Collection Time: 02/17/18  1:12 PM  Result Value Ref Range Status   Specimen Description   Final    BLOOD RIGHT ANTECUBITAL Performed at [facility] [address], [place], [place] [address]    Special Requests   Final    BOTTLES DRAWN AEROBIC ONLY Blood Culture adequate volume Performed at [facility] [address], [place], [place] [address]    Culture   Final    NO GROWTH 5 DAYS Performed at [facility], [place] [address], [place], [place] [address]    Report Status 02/22/2018 FINAL  Final  Culture, blood (routine x 2)     Status: None   Collection Time: 02/17/18  1:14 PM  Result Value Ref Range Status   Specimen Description   Final    BLOOD RIGHT ANTECUBITAL Performed at [facility] [address] [place], [place] [address]    Special Requests   Final    BOTTLES DRAWN AEROBIC ONLY Blood Culture adequate volume Performed at [facility] [address] [place], [place] [address]    Culture   Final    NO GROWTH 5 DAYS Performed at [facility], [place] [address], [place], [place] [address]    Report Status 02/22/2018 FINAL  Final  C difficile quick scan w PCR reflex     Status: None   Collection Time: 02/17/18  3:08 PM  Result Value Ref Range Status   C Diff antigen NEGATIVE NEGATIVE Final   C Diff toxin NEGATIVE NEGATIVE Final    C Diff interpretation No C. difficile detected.  Final    Comment: Performed at [facility], [place] [address], [place], [place] [address]    Michel Bickers, [place] for [place] [phone] pager   [phone]  cell 02/23/2018, 4:14 PM

## 2018-02-23 NOTE — Progress Notes (Signed)
All morning meds on hold per MD to wait to see results of CT scan.

## 2018-02-23 NOTE — Progress Notes (Signed)
Pt had 3 beat run of vtach, resting in bed quietly, no complaints voiced. Nightshift hospitalist notified via textpage.

## 2018-02-23 NOTE — Progress Notes (Addendum)
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  She is completed 3 cycles of chemotherapy and is now status post bilateral salpingo-oophorectomy and omentectomy with debulking on 8/22.   8/26 transferred to ICU w/ concern for perforation w/ free air noted.  Imipenem started. Went for exploratory laparotomy with washout.  No significant contamination identified, presumed spontaneously closed microperf. Returned with abdominal drains postoperative chest x-ray showing evidence of aspiration pneumonia; mixed respiratory and metabolic acidosis & in septic shock. Required intubation.   8/27 GI medicine consulted, did not feel she had overt GIB.  Rec's prophylaxis with PPI.  Signed off.  Weaning pressors. Hgb dropped from 8.8 to 7.4; got 2 units of blood,  febrile, added. vancomycin.  Neupogen given for neutropenia.   8/28-9/1 Shock resolved, TPN started. Fevers. Delirium barrier to weaning.  ABX for fever  9/02-9/6 Delirium. Sedation difficult, diuresing, remains on abx. spiking temps, loculated fluid collection identified in left pelvis. Not amedable to drainage.  ID consulted. Started seroquel. 9/4. Upper 2 drains removed. Staples removed.from incision site.  9/4-9/9 ID consulted by family.  Fevers ongoing, CT abd with improved fluid collections.  Failing PSV wean.  No pressors.  No sedation.   Husband deferring decisions about care to son (newly married).   9/10  Sister at bedside - says she is not in favor of trach but says is the decision of son/dpoa. Son met with Dr Alvy Bimler for goals of care - current status precludes chemo but goal is trach and full code and full medical care and later chemo when ECOG improves.  Low grade fever persists - ID following. More awake today.  No Perf leak on CT abd and restarted TF  9/12-9/14  s/p Trach - Dr Nelda Marseille. Levophed post trach.  Fevers. 1 unit PRBC,   9/14 - [facility] consulted for medical management   Subjective: Patient seen and examined, she report increase in abdominal pain, per nursing staff abdomen more distended today. Report that her breathing is ok. No acute events overnight. Remains afebrile. FT on hold, not passing gas. Had loose BM yesterday.  Assessment & Plan:   Principal Problem:   Pelvic abscess in female Active Problems:   Primary peritoneal carcinomatosis ([place])   Severe protein-calorie malnutrition ([place])   Severe sepsis ([name])   COPD (chronic obstructive pulmonary disease) ([name])   HLD (hyperlipidemia)   Absolute anemia   White coat syndrome with hypertension   Ovarian cancer ([name])   AKI (acute kidney injury) ([place])   Acute metabolic encephalopathy   Acute respiratory distress   Low urine output   Somnolence   At risk for fluid volume overload   Free intraperitoneal air   Acute on chronic respiratory failure ([name])   Pressure injury of skin   Status post bilateral salpingo-oophorectomy   Peritonitis ([place])   HCAP (healthcare-associated pneumonia)   Postoperative wound infection   Aspiration pneumonia ([name])  Acute on chronic hypoxemic respiratory failure status post trach (9/12) In setting of aspiration pneumonia/ARDS Right-sided pleural effusion, per PCCM no significant pocket to drain on bedside ultrasound.  Patient tolerating ATC well during the day.  Vent only at night.  Continue pulmonary hygiene as able.  Continue Lasix.  Continue trach management per pulmonary team.  Status post exploratory laparotomy for free air on 8/26; following bilateral salpingo-oophorectomy, omentectomy  and debulking surgery on 8/22. Continue management per OB/GYN/ONC  Abdominal pain/Ileus  Check abd 2 views to r/o obstruction, mild increase in WBC. May need to consider gen surgery consult.  If x-ray negative will  consider CT abdomen and pelvis with contrast.  Keep K above 4 magnesium above 2.  Severe sepsis Initially with peritonitis (microperforation) subsequently complicated by pneumonia. Being followed by ID.  Defer antibiotic therapy per ID and primary team.  HTN  BP remains labile, currently on IV Lasix and labetalol as needed.  Patient not tolerating oral diet at this moment will continue IV PRN medications for now.  Soon as she is able to tolerate oral diet or tube feeds will start amlodipine.  Echo LVEF 65 to 70% with moderate LVH, grade 1 diastolic dysfunction.  Severe protein calorie malnutrition Holding tube feeding due to abdominal distention/pain.  Postoperative anemia Status post 1 unit PRBCs, hemoglobin stable Transfuse if hemoglobin less than 7.  Hypokalemia/hypomagnesemia Replete Check BMP and mag in a.m.  Acute metabolic encephalopathy In setting of critical illness and sedation. Mental status significantly improved Taper Seroquel, continue Celexa.  Deconditioning  Patient will benefit from LTAC  Continue PT per ICU protocol   Code Status: Full Code  Family Communication: Husband at bedside    Procedures:     Antimicrobials: Anti-infectives (From admission, onward)   Start     Dose/Rate Route Frequency Ordered Stop   02/22/18 1400  vancomycin (VANCOCIN) IVPB 750 mg/150 ml premix  Status:  Discontinued     750 mg 150 mL/hr over 60 Minutes Intravenous Every 24 hours 02/22/18 1335 02/22/18 1337   02/22/18 1400  vancomycin (VANCOCIN) IVPB 1000 mg/200 mL premix     1,000 mg 200 mL/hr over 60 Minutes Intravenous Every 24 hours 02/22/18 1337     02/22/18 1200  vancomycin (VANCOCIN) IVPB 750 mg/150 ml premix  Status:  Discontinued     750 mg 150 mL/hr over 60 Minutes Intravenous Every 24 hours 02/22/18 0903 02/22/18 1243   02/17/18 1200  vancomycin (VANCOCIN) IVPB 750 mg/150 ml premix  Status:  Discontinued     750 mg 150 mL/hr over 60 Minutes Intravenous Every 24  hours 02/16/18 1518 02/22/18 0521   02/16/18 1800  Ampicillin-Sulbactam (UNASYN) 3 g in sodium chloride 0.9 % 100 mL IVPB     3 g 200 mL/hr over 30 Minutes Intravenous Every 6 hours 02/16/18 1619     02/07/18 1200  vancomycin (VANCOCIN) IVPB 1000 mg/200 mL premix  Status:  Discontinued     1,000 mg 200 mL/hr over 60 Minutes Intravenous Every 24 hours 02/06/18 1315 02/16/18 1518   02/06/18 1015  vancomycin (VANCOCIN) IVPB 1000 mg/200 mL premix     1,000 mg 200 mL/hr over 60 Minutes Intravenous STAT 02/06/18 1012 02/06/18 1211   02/02/18 1000  anidulafungin (ERAXIS) 100 mg in sodium chloride 0.9 % 100 mL IVPB     100 mg 78 mL/hr over 100 Minutes Intravenous Every 24 hours 02/01/18 0916     02/02/18 1000  vancomycin (VANCOCIN) IVPB 1000 mg/200 mL premix  Status:  Discontinued     1,000 mg 200 mL/hr over 60 Minutes Intravenous Every 24 hours 02/01/18 1039 02/04/18 0852   02/01/18 1000  anidulafungin (ERAXIS) 200 mg in sodium chloride 0.9 % 200 mL IVPB     200 mg 78 mL/hr over 200 Minutes Intravenous  Once 02/01/18 0916 02/01/18 1602   02/01/18 0830  vancomycin (VANCOCIN) 1,250 mg in sodium chloride 0.9 % 250  mL IVPB     1,250 mg 166.7 mL/hr over 90 Minutes Intravenous STAT 02/01/18 0822 02/01/18 1035   01/31/18 1800  meropenem (MERREM) 1 g in sodium chloride 0.9 % 100 mL IVPB  Status:  Discontinued     1 g 200 mL/hr over 30 Minutes Intravenous Every 8 hours 01/31/18 1012 02/16/18 1619   01/31/18 0900  meropenem (MERREM) 1 g in sodium chloride 0.9 % 100 mL IVPB     1 g 200 mL/hr over 30 Minutes Intravenous STAT 01/31/18 0852 01/31/18 1005   01/27/18 0600  cefOXitin (MEFOXIN) 2 g in sodium chloride 0.9 % 100 mL IVPB     2 g 200 mL/hr over 30 Minutes Intravenous On call to O.R. 01/27/18 0533 01/27/18 0753        Objective: Vitals:   02/23/18 0800 02/23/18 0820 02/23/18 0824 02/23/18 0826  BP: (!) 162/66     Pulse: (!) 125     Resp: 17     Temp:      TempSrc:      SpO2: 100% 100%  98% 98%  Weight:      Height:        Intake/Output Summary (Last 24 hours) at 02/23/2018 0910 Last data filed at 02/23/2018 0600 Gross per 24 hour  Intake 749.87 ml  Output 3280 ml  Net -2530.13 ml   Filed Weights   02/21/18 0500 02/22/18 0336 02/23/18 0630  Weight: 60.5 kg 60.4 kg 60.4 kg    Examination:  General exam: NAD   HEENT: NG tube and trach in place  Respiratory system: Decrease breath sounds, diffuse rhonchi, no wheezing  Cardiovascular system: S1 & S2 heard, RRR. No JVD, murmurs, rubs or gallops Gastrointestinal system: Abdomen firm, distended, R hemi abdomen tenderness, midline scar clean and intact. JP in place  Central nervous system: AAOx3  Extremities: No pedal edema. Skin: No rashes  Data Reviewed: I have personally reviewed following labs and imaging studies  CBC: Recent Labs  Lab 02/19/18 0548 02/19/18 1836 02/20/18 0416 02/21/18 0323 02/22/18 0326 02/23/18 0645  WBC 7.8  --  9.2 8.2 7.7 11.0*  NEUTROABS 5.2  --  6.4 6.2 5.0 8.0*  HGB 6.9* 8.1* 8.7* 8.5* 8.6* 10.0*  HCT 21.9* 25.2* 27.3* 27.0* 27.7* 31.5*  MCV 81.1  --  80.3 80.8 81.7 80.8  PLT 307  --  332 346 372 026   Basic Metabolic Panel: Recent Labs  Lab 02/18/18 0414 02/19/18 0548 02/20/18 0416 02/21/18 0323 02/22/18 0326 02/23/18 0645  NA 141 142 145 141 147* 145  K 3.0* 2.6* 2.7* 2.8* 3.2* 3.2*  CL 91* 92* 97* 96* 100 99  CO2 42* 40* 38* 37* 36* 35*  GLUCOSE 226* 113* 112* 151* 103* 119*  BUN _0 CREATININE 0.53 0.45 0.41* 0.54 0.47 0.54  CALCIUM 7.7* 7.7* 8.2* 7.9* 8.6* 8.7*  MG 1.8 1.6* 1.8 1.7 2.0 1.6*  PHOS 2.2* 3.3  --  3.5 4.1 4.0   GFR: Estimated Creatinine Clearance: 52.6 mL/min (by C-G formula based on SCr of 0.54 mg/dL). Liver Function Tests: Recent Labs  Lab 02/20/18 0416 02/21/18 0323 02/22/18 0326 02/23/18 0645  AST _1 ALT _2 ALKPHOS 103 89 84 91  BILITOT 0.7 0.3 0.6 0.6  PROT 6.5 6.5 6.8 7.6  ALBUMIN 1.7* 1.6*  1.7* 1.9*   No results for input(s): LIPASE, AMYLASE in the last 168 hours. No results for input(s): AMMONIA  in the last 168 hours. Coagulation Profile: Recent Labs  Lab 02/17/18 0500 02/17/18 0838  INR 1.28 1.28   Cardiac Enzymes: No results for input(s): CKTOTAL, CKMB, CKMBINDEX, TROPONINI in the last 168 hours. BNP (last 3 results) No results for input(s): PROBNP in the last 8760 hours. HbA1C: No results for input(s): HGBA1C in the last 72 hours. CBG: Recent Labs  Lab 02/22/18 1534 02/22/18 1949 02/22/18 2341 02/23/18 0328 02/23/18 0729  GLUCAP 134* 84 104* 111* 108*   Lipid Profile: No results for input(s): CHOL, HDL, LDLCALC, TRIG, CHOLHDL, LDLDIRECT in the last 72 hours. Thyroid Function Tests: No results for input(s): TSH, T4TOTAL, FREET4, T3FREE, THYROIDAB in the last 72 hours. Anemia Panel: No results for input(s): VITAMINB12, FOLATE, FERRITIN, TIBC, IRON, RETICCTPCT in the last 72 hours. Sepsis Labs: No results for input(s): PROCALCITON, LATICACIDVEN in the last 168 hours.  Recent Results (from the past 240 hour(s))  Aerobic/Anaerobic Culture (surgical/deep wound)     Status: None   Collection Time: 02/13/18 11:44 AM  Result Value Ref Range Status   Specimen Description JP DRAINAGE  Final   Special Requests NONE  Final   Gram Stain   Final    FEW WBC PRESENT, PREDOMINANTLY PMN MODERATE GRAM NEGATIVE RODS FEW GRAM POSITIVE RODS Performed at [facility], [address], [place], [place] [address]    Culture   Final    RARE ESCHERICHIA COLI MODERATE LACTOBACILLUS SPECIES Standardized susceptibility testing for this organism is not available. MIXED ANAEROBIC FLORA PRESENT.  CALL LAB IF FURTHER IID REQUIRED.    Report Status 02/18/2018 FINAL  Final   Organism ID, Bacteria ESCHERICHIA COLI  Final      Susceptibility   Escherichia coli - MIC*    AMPICILLIN 4 SENSITIVE Sensitive     CEFAZOLIN <=4 SENSITIVE Sensitive     CEFEPIME <=1 SENSITIVE  Sensitive     CEFTAZIDIME <=1 SENSITIVE Sensitive     CEFTRIAXONE <=1 SENSITIVE Sensitive     CIPROFLOXACIN <=0.25 SENSITIVE Sensitive     GENTAMICIN <=1 SENSITIVE Sensitive     IMIPENEM <=0.25 SENSITIVE Sensitive     TRIMETH/SULFA <=20 SENSITIVE Sensitive     AMPICILLIN/SULBACTAM <=2 SENSITIVE Sensitive     PIP/TAZO <=4 SENSITIVE Sensitive     Extended ESBL NEGATIVE Sensitive     * RARE ESCHERICHIA COLI  Culture, Urine     Status: None   Collection Time: 02/17/18 12:45 PM  Result Value Ref Range Status   Specimen Description   Final    URINE, CATHETERIZED Performed at [facility] [address], [place], [place] [address]    Special Requests   Final    Immunocompromised Performed at [facility], [place] [address] [place], [place] [address]    Culture   Final    NO GROWTH Performed at [facility], [place] [address], [place], [place] [address]    Report Status 02/18/2018 FINAL  Final  Culture, blood (routine x 2)     Status: None   Collection Time: 02/17/18  1:12 PM  Result Value Ref Range Status   Specimen Description   Final    BLOOD RIGHT ANTECUBITAL Performed at [facility] [address], [place], [place] [address]    Special Requests   Final    BOTTLES DRAWN AEROBIC ONLY Blood Culture adequate volume Performed at [facility] [address], [place], [place] [address]    Culture   Final  NO GROWTH 5 DAYS Performed at [facility], [place] [address], [place], [place] [address]    Report Status 02/22/2018 FINAL  Final  Culture, blood (routine x 2)     Status: None   Collection Time: 02/17/18  1:14 PM  Result Value Ref Range Status   Specimen Description   Final    BLOOD RIGHT ANTECUBITAL Performed at [facility] [address] [place], [place] [address]    Special Requests   Final    BOTTLES DRAWN AEROBIC ONLY Blood Culture adequate volume Performed at  [facility] [address] [place], [place] [address]    Culture   Final    NO GROWTH 5 DAYS Performed at [facility], [place] [address] [place], [place] [address]    Report Status 02/22/2018 FINAL  Final  C difficile quick scan w PCR reflex     Status: None   Collection Time: 02/17/18  3:08 PM  Result Value Ref Range Status   C Diff antigen NEGATIVE NEGATIVE Final   C Diff toxin NEGATIVE NEGATIVE Final   C Diff interpretation No C. difficile detected.  Final    Comment: Performed at [facility], [place] [address], [place], [place] [address]     Radiology Studies: Dg Chest 1 View  Result Date: 02/22/2018 CLINICAL DATA:  Abdominal distention, COPD, history of ovarian carcinoma EXAM: CHEST  1 VIEW COMPARISON:  Portable chest x-ray of 02/20/2018 and CT pelvis of 02/14/2017 FINDINGS: Aeration of the lungs has improved slightly. Opacity in the right mid lung remains some of which is due to overlapping tubing. There may be a small left effusion present. Right central venous line tip overlies the lower SVC and tracheostomy remains. Left-sided PICC line tip overlies the expected SVC-RA junction. Heart size is stable. Loop recorder remains. NG tube extends below the hemidiaphragm. IMPRESSION: 1. Slightly improved aeration. 2. Opacity remains primarily in the right mid lung. 3. Tracheostomy appears to remain in good position. Electronically Signed   By: Ivar Drape M.D.   On: 02/22/2018 09:14   Dg Abd 1 View  Result Date: 02/22/2018 CLINICAL DATA:  Abdominal distention EXAM: ABDOMEN - 1 VIEW COMPARISON:  02/21/2018 FINDINGS: NG tube coils in the fundus of the stomach. Diffuse gaseous distention of bowel again noted, favor ileus. Overall degree of gaseous distention similar to prior study. Drainage catheter again noted within the pelvis, unchanged. No visible free air or organomegaly. IMPRESSION: Continued diffuse gaseous distention of bowel, stable, favor  ileus. Electronically Signed   By: Rolm Baptise M.D.   On: 02/22/2018 09:11   Dg Abd 1 View  Result Date: 02/21/2018 CLINICAL DATA:  Vomiting and abdominal distention EXAM: ABDOMEN - 1 VIEW COMPARISON:  02/21/2018 FINDINGS: A nasogastric tube is present with tip in the stomach and side port in the stomach. Curvilinear tubing projects over the pelvis. Gaseous prominence of large bowel and likely small bowel loops. Aortoiliac atherosclerotic vascular disease. Asymmetric degenerative arthropathy of the left hip. IMPRESSION: 1. Mild gaseous prominence of loops of large and small bowel, nonspecific for ileus versus distal obstruction. 2. Drainage tubing in the pelvis. 3.  Aortic Atherosclerosis (ICD10-I70.0). 4. Asymmetric degenerative arthropathy left hip. 5. Nasogastric tube noted in the stomach. Electronically Signed   By: [place] Clines M.D.   On: 02/21/2018 19:26     Scheduled Meds: . chlorhexidine gluconate (MEDLINE KIT)  15 mL Mouth Rinse BID  . citalopram  20 mg Per  Tube Daily  . enoxaparin (LOVENOX) injection  40 mg Subcutaneous Q24H  . feeding supplement (VITAL AF 1.2 CAL)  1,000 mL Per Tube Q24H  . free water  100 mL Per Tube Q8H  . furosemide  40 mg Intravenous Daily  . insulin aspart  0-20 Units Subcutaneous Q4H  . ipratropium-albuterol  3 mL Nebulization TID  . mouth rinse  15 mL Mouth Rinse 10 times per day  . multivitamin with minerals  1 tablet Oral Daily  . pantoprazole sodium  40 mg Per Tube BID  . potassium chloride  30 mEq Per Tube BID  . QUEtiapine  25 mg Per Tube Daily  . QUEtiapine  50 mg Oral QHS  . sodium chloride flush  10-40 mL Intracatheter Q12H   Continuous Infusions: . sodium chloride 10 mL/hr at 02/21/18 1626  . sodium chloride Stopped (02/21/18 1246)  . ampicillin-sulbactam (UNASYN) IV 3 g (02/23/18 0559)  . anidulafungin Stopped (02/22/18 1306)  . vancomycin Stopped (02/22/18 1500)     LOS: 27 days    Time spent: Total of 25 minutes spent with pt,  greater than 50% of which was spent in discussion of  treatment, counseling and coordination of care   Chipper Oman, MD Pager: Text Page via [email or web address]   If 7PM-7AM, please contact night-coverage [email or web address] 02/23/2018, 9:10 AM   Note - This record has been created using [employer]. Chart creation errors have been sought, but may not always have been located. Such creation errors do not reflect on the standard of medical care.

## 2018-02-23 NOTE — Progress Notes (Signed)
Came into the room where pt. Was coughing. Pt. Had a heard cough and NG tube almost out. Reinserted NG tube and stopped low intermittent suction until confirmation of xray.

## 2018-02-24 ENCOUNTER — Inpatient Hospital Stay: Payer: Medicare PPO | Admitting: Gynecologic Oncology

## 2018-02-24 DIAGNOSIS — Z9889 Other specified postprocedural states: Secondary | ICD-10-CM

## 2018-02-24 DIAGNOSIS — R140 Abdominal distension (gaseous): Secondary | ICD-10-CM

## 2018-02-24 DIAGNOSIS — T8131XA Disruption of external operation (surgical) wound, not elsewhere classified, initial encounter: Secondary | ICD-10-CM

## 2018-02-24 LAB — BASIC METABOLIC PANEL
ANION GAP: 6 (ref 5–15)
BUN: 11 mg/dL (ref 8–23)
CALCIUM: 6.6 mg/dL — AB (ref 8.9–10.3)
CHLORIDE: 115 mmol/L — AB (ref 98–111)
CO2: 27 mmol/L (ref 22–32)
CREATININE: 0.39 mg/dL — AB (ref 0.44–1.00)
GFR calc Af Amer: >60 mL/min (ref >60)
GFR calc non Af Amer: >60 mL/min (ref >60)
GLUCOSE: 89 mg/dL (ref 70–99)
Potassium: 2.4 mmol/L — CL (ref 3.5–5.1)
SODIUM: 148 mmol/L — AB (ref 135–145)

## 2018-02-24 LAB — GLUCOSE, CAPILLARY
GLUCOSE-CAPILLARY: 88 mg/dL (ref 70–99)
GLUCOSE-CAPILLARY: 99 mg/dL (ref 70–99)
GLUCOSE-CAPILLARY: 114 mg/dL — AB (ref 70–99)
GLUCOSE-CAPILLARY: 139 mg/dL — AB (ref 70–99)
GLUCOSE-CAPILLARY: 121 mg/dL — AB (ref 70–99)
Glucose-Capillary: 103 mg/dL — ABNORMAL HIGH (ref 70–99)
Glucose-Capillary: 113 mg/dL — ABNORMAL HIGH (ref 70–99)

## 2018-02-24 LAB — CBC WITH DIFFERENTIAL/PLATELET
Basophils Absolute: 0 10*3/uL (ref 0.0–0.1)
Basophils Relative: 0 %
EOS PCT: 2 %
Eosinophils Absolute: 0.2 10*3/uL (ref 0.0–0.7)
HEMATOCRIT: 28.1 % — AB (ref 36.0–46.0)
HEMOGLOBIN: 8.9 g/dL — AB (ref 12.0–15.0)
LYMPHS ABS: 1.7 10*3/uL (ref 0.7–4.0)
LYMPHS PCT: 19 %
MCH: 25.7 pg — ABNORMAL LOW (ref 26.0–34.0)
MCHC: 31.7 g/dL (ref 30.0–36.0)
MCV: 81.2 fL (ref 78.0–100.0)
Monocytes Absolute: 0.9 10*3/uL (ref 0.1–1.0)
Monocytes Relative: 10 %
Neutro Abs: 6.3 10*3/uL (ref 1.7–7.7)
Neutrophils Relative %: 69 %
Platelets: 359 10*3/uL (ref 150–400)
RBC: 3.46 MIL/uL — AB (ref 3.87–5.11)
RDW: 22.5 % — ABNORMAL HIGH (ref 11.5–15.5)
WBC: 9 10*3/uL (ref 4.0–10.5)

## 2018-02-24 LAB — PHOSPHORUS: Phosphorus: 3.8 mg/dL (ref 2.5–4.6)

## 2018-02-24 LAB — MAGNESIUM: Magnesium: 1.9 mg/dL (ref 1.7–2.4)

## 2018-02-24 MED ORDER — LIDOCAINE HCL 2 % IJ SOLN
INTRAMUSCULAR | Status: AC
  Administered 2018-02-24: 30 mL
  Filled 2018-02-24: qty 40

## 2018-02-24 MED ORDER — FAMOTIDINE IN NACL 20-0.9 MG/50ML-% IV SOLN
20.0000 mg | Freq: Two times a day (BID) | INTRAVENOUS | Status: DC
  Administered 2018-02-24 – 2018-02-25 (×3): 20 mg via INTRAVENOUS
  Filled 2018-02-24 (×3): qty 50

## 2018-02-24 MED ORDER — POTASSIUM CHLORIDE 10 MEQ/100ML IV SOLN
10.0000 meq | INTRAVENOUS | Status: DC
  Administered 2018-02-24 (×4): 10 meq via INTRAVENOUS
  Filled 2018-02-24 (×4): qty 100

## 2018-02-24 MED ORDER — POTASSIUM CHLORIDE 10 MEQ/50ML IV SOLN
10.0000 meq | INTRAVENOUS | Status: AC
  Administered 2018-02-24 (×4): 10 meq via INTRAVENOUS
  Filled 2018-02-24 (×4): qty 50

## 2018-02-24 MED ORDER — TRACE MINERALS CR-CU-MN-SE-ZN 10-1000-500-60 MCG/ML IV SOLN
INTRAVENOUS | Status: AC
  Administered 2018-02-24: 18:00:00 via INTRAVENOUS
  Filled 2018-02-24: qty 960

## 2018-02-24 MED ORDER — MIDAZOLAM HCL 2 MG/2ML IJ SOLN
INTRAMUSCULAR | Status: AC
  Administered 2018-02-24: 2 mg
  Filled 2018-02-24: qty 4

## 2018-02-24 MED ORDER — METOPROLOL TARTRATE 5 MG/5ML IV SOLN
5.0000 mg | Freq: Four times a day (QID) | INTRAVENOUS | Status: DC
  Administered 2018-02-24 – 2018-02-25 (×3): 5 mg via INTRAVENOUS
  Filled 2018-02-24 (×3): qty 5

## 2018-02-24 MED ORDER — MAGNESIUM SULFATE 2 GM/50ML IV SOLN
2.0000 g | Freq: Once | INTRAVENOUS | Status: AC
  Administered 2018-02-24: 2 g via INTRAVENOUS
  Filled 2018-02-24: qty 50

## 2018-02-24 NOTE — Progress Notes (Signed)
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  She is completed 3 cycles of chemotherapy and is now status post bilateral salpingo-oophorectomy and omentectomy with debulking on 8/22.   8/26 transferred to ICU w/ concern for perforation w/ free air noted.  Imipenem started. Went for exploratory laparotomy with washout.  No significant contamination identified, presumed spontaneously closed microperf. Returned with abdominal drains postoperative chest x-ray showing evidence of aspiration pneumonia; mixed respiratory and metabolic acidosis & in septic shock. Required intubation.   8/27 GI medicine consulted, did not feel she had overt GIB.  Rec's prophylaxis with PPI.  Signed off.  Weaning pressors. Hgb dropped from 8.8 to 7.4; got 2 units of blood,  febrile, added. vancomycin.  Neupogen given for neutropenia.   8/28-9/1 Shock resolved, TPN started. Fevers. Delirium barrier to weaning.  ABX for fever  9/02-9/6 Delirium. Sedation difficult, diuresing, remains on abx. spiking temps, loculated fluid collection identified in left pelvis. Not amedable to drainage.  ID consulted. Started seroquel. 9/4. Upper 2 drains removed. Staples removed.from incision site.  9/4-9/9 ID consulted by family.  Fevers ongoing, CT abd with improved fluid collections.  Failing PSV wean.  No pressors.  No sedation.   Husband deferring decisions about care to son (newly married).   9/10  Sister at bedside - says she is not in favor of trach but says is the decision of son/dpoa. Son met with Dr Alvy Bimler for goals of care - current status precludes chemo but goal is trach and full code and full medical care and later chemo when ECOG improves.  Low grade fever persists - ID following. More  awake today. No Perf leak on CT abd and restarted TF  9/12-9/14  s/p Trach - Dr Nelda Marseille. Levophed post trach.  Fevers.  1 unit PRBC  9/15 ATC for first time; tolerating well. Did PRVC overnight  919 is been requiring full mechanical ventilatory support due to abdominal distention  Subjective / Interval Events  -Full mechanical ventilatory support for over 48 hours due to abdominal distention   Objective    Blood pressure (!) 152/69, pulse 100, temperature 99 F (37.2 C), temperature source Oral, resp. rate 20, height _0  (1.6 m), weight 54.9 kg, SpO2 100 %.    Vent Mode: PRVC FiO2 (%):  [28 %-30 %] 30 % Set Rate:  [20 bmp] 20 bmp Vt Set:  [330 mL] 330 mL PEEP:  [5 cmH20] 5 cmH20 Plateau Pressure:  [12 cmH20-18 cmH20] 18 cmH20   Intake/Output Summary (Last 24 hours) at 02/24/2018 5750 Last data filed at 02/24/2018 0600 Gross per 24 hour  Intake 1203.97 ml  Output 1820 ml  Net -616.03 ml   Filed Weights   02/22/18 0336 02/23/18 0630 02/24/18 0441  Weight: 60.4 kg 60.4 kg 54.9 kg     EXAM  General: Frail cachectic female HEENT: Tracheostomy in place, full mechanical ventilatory support Neuro: Dull effect, follows commands moves all extremities CV: Sounds are regular PULM: even/non-labored, lungs bilaterally decreased in bases GI: Distended, tender, no bowel sounds evident Extremities: warm/dry, negative edema  Skin: no rashes or lesions    Labs    PULMONARY No results for input(s): PHART, PCO2ART, PO2ART, HCO3, TCO2, O2SAT in the last 168 hours.  Invalid input(s): PCO2, PO2  CBC Recent Labs  Lab 02/22/18 0326 02/23/18 0645 02/24/18 0330  HGB 8.6* 10.0* 8.9*  HCT 27.7* 31.5* 28.1*  WBC 7.7 11.0* 9.0  PLT 372 379 359    COAGULATION Recent Labs  Lab 02/17/18 0838  INR 1.28   CARDIAC   No results for input(s): TROPONINI in the last 168 hours. No results for input(s): PROBNP in the last 168 hours.  CHEMISTRY Recent Labs  Lab 02/19/18 0548  02/20/18 0416 02/21/18 0323 02/22/18 0326 02/23/18 0645 02/24/18 0330  NA 142 145 141 147* 145  --   K 2.6* 2.7* 2.8* 3.2* 3.2*  --   CL 92* 97* 96* 100 99  --   CO2 40* 38* 37* 36* 35*  --   GLUCOSE 113* 112* 151* 103* 119*  --   BUN _0 --   CREATININE 0.45 0.41* 0.54 0.47 0.54  --   CALCIUM 7.7* 8.2* 7.9* 8.6* 8.7*  --   MG 1.6* 1.8 1.7 2.0 1.6* 1.9  PHOS 3.3  --  3.5 4.1 4.0 3.8   Estimated Creatinine Clearance: 52.6 mL/min (by C-G formula based on SCr of 0.54 mg/dL).  LIVER Recent Labs  Lab 02/17/18 0838 02/20/18 0416 02/21/18 0323 02/22/18 0326 02/23/18 0645  AST  --  _1 ALT  --  _2 ALKPHOS  --  103 89 84 91  BILITOT  --  0.7 0.3 0.6 0.6  PROT  --  6.5 6.5 6.8 7.6  ALBUMIN  --  1.7* 1.6* 1.7* 1.9*  INR 1.28  --   --   --   --    INFECTIOUS No results for input(s): LATICACIDVEN, PROCALCITON in the last 168 hours.  ENDOCRINE CBG (last 3)  Recent Labs    02/23/18 2358 02/24/18 0329 02/24/18 0723  GLUCAP 103* 88 99   IMAGING  Dg Chest 1 View  Result Date: 02/23/2018 CLINICAL DATA:  Nasogastric tube placement. EXAM: CHEST  1 VIEW COMPARISON:  Chest radiograph February 22, 2018 FINDINGS: Nasogastric tube looped in proximal stomach. Stable appearance of tracheostomy tube, LEFT PICC, RIGHT single-lumen chest Port-A-Cath, pacemaker device versus loop monitor. Cardiomediastinal silhouette is normal. Calcified aortic arch. RIGHT upper lobe alveolar airspace opacity. Diffuse interstitial prominence with patchy RIGHT lower lobe airspace opacity. No pneumothorax. Soft tissue planes and included osseous structures are unchanged. IMPRESSION: Nasogastric tube tip projects in proximal stomach, stable appearance of remaining life support lines. RIGHT upper lobe consolidation with RIGHT lower lobe atelectasis or early pneumonia. Similar interstitial prominence. Aortic Atherosclerosis (ICD10-I70.0). Electronically Signed   By: Elon Alas M.D.    On: 02/23/2018 15:29   Ct Abdomen Pelvis W Contrast  Result Date: 02/23/2018 CLINICAL DATA:  Increased abdominal distension with vomiting. Stage IV ovarian cancer status post debulking on 08/22 in subsequent re-exploration for concern of bowel perforation. EXAM: CT ABDOMEN AND PELVIS WITH CONTRAST TECHNIQUE: Multidetector CT imaging of the abdomen and pelvis was performed using the standard protocol following bolus administration of intravenous contrast. CONTRAST:  120m OMNIPAQUE IOHEXOL 300 MG/ML  SOLN COMPARISON:  02/11/2018.  Pelvis CT 02/14/2018. FINDINGS: Lower chest: Small bilateral pleural effusions with bibasilar dependent collapse/consolidation Hepatobiliary: No focal abnormality within the liver parenchyma. Gallbladder is distended and appears to track laterally up over the lateral margin of the liver. No intrahepatic or extrahepatic biliary dilation. Pancreas: No focal mass lesion. No dilatation of the main duct. No intraparenchymal cyst. No peripancreatic edema. Spleen: No splenomegaly. No  focal mass lesion. Adrenals/Urinary Tract: No adrenal nodule or mass. Right kidney unremarkable. Stable central sinus cyst lower pole left kidney. No evidence for hydroureter. Bladder is distended and gas in the bladder lumen suggests recent instrumentation. Stomach/Bowel: Stomach is nondistended with NG tube in situ. Duodenum is normally positioned as is the ligament of Treitz. Duodenal diverticulum evident. Diffuse fluid-filled distended small bowel evident with proximal loops measuring up to 3.4 cm diameter. Colon is diffusely gas filled and distended with non dependent segments (distal transverse) measuring up to 6 cm diameter. There is no evidence for small bowel or colonic wall thickening although some mild hyperenhancement is noted in the tip of the cecum. Rectal tube visualized in situ. Vascular/Lymphatic: There is abdominal aortic atherosclerosis without aneurysm. There is no gastrohepatic or  hepatoduodenal ligament lymphadenopathy. No intraperitoneal or retroperitoneal lymphadenopathy. No pelvic sidewall lymphadenopathy. Reproductive: Uterus surgically absent.  There is no adnexal mass. Other: Small volume ascites is seen around the liver and spleen. There is edema diffusely in the mesentery, scattered areas of interloop mesenteric fluid, and trace fluid evident in each paracolic gutter. Small volume free fluid seen in the pelvis. Surgical drain noted looped in the left lower quadrant. 7.7 x 1.9 cm rim enhancing fluid collection tracks along the left pelvic sidewall. Another smaller rim enhancing fluid collection is seen medial to this on 67/2. Non dependent portion of this collection demonstrates a small gas bubble (69/2). Comparing to 02/11/2018 and 02/14/2018, the attenuation in this collection has decreased as has the volume of gas. Musculoskeletal: Degenerative changes noted in the left hip. No worrisome lytic or sclerotic osseous abnormality. Open midline wound evident. IMPRESSION: 1. Diffuse distention of large and small bowel. There are some small bowel loops in the right abdomen that are decompressed, but non dependent segments of colon are distended with gas. CT imaging features favor ileus although small bowel obstruction is possible. 2. Evolution of the left pelvic multifocal rim enhancing fluid collection. Gas within this collection has decreased in the interval. Material in this collection on the 02/11/2018 exam was higher in attenuation. This may represent an evolving hematoma although abscess remains a consideration. 3. Similar intraperitoneal free fluid with interloop mesenteric fluid and mesenteric edema. 4. Open midline wound. 5. Small bilateral pleural effusions with bibasilar dependent collapse/consolidation in the lower lobes. Electronically Signed   By: Misty Stanley M.D.   On: 02/23/2018 13:46   Micro Data: Blood Culture x2 8/26 >> neg Sputum 8/27: Abundant yeast, few Candida  albicans Blood Cx 9/1 >> negative Respiratory 9/1 >> few Candida Urine 9/1 >> negative Peritoneal fluid 8/31 >> neg Abd Drainage 9/5 >> few lactobacillus  JP Drainage 9/8 >> E-Coli C-Diff 9/12 >> negative  Antimicrobials:  Meropenem 8/26 >> 9/11 Vancomycin 8/27 >> 8/30; 9/1 >> 9/11, 9/17>> Eraxis 8/27 >>  Unasyn 9/11 >>   Studies: CT abdomen 8/26: Hemic bowel with portal venous gas and bowel pneumatosis with large amount of pneumoperitoneum.  Unclear as to site of perforation.  Multiple pockets of air adjacent to the sigmoid colon.  Enhancement of the peritoneum concerning for peritonitis.  There was aortic arterial sclerosis.  Echocardiogram 8/27: Particular ejection fraction 65 to 70%.  She has moderate LVH, and hyperdynamic LV function.  There is grade 1 diastolic dysfunction.  Trivial MR.  Normal left atrial size.  The IV C was not visualized  CT abdomen 9/2 >> drainage catheters within the pelvis, RUQ, LUQ.  Significant interval decrease in size of previously described intra-abdominal fluid,  no residual free intraperitoneal air. There is a persistent loculated fluid & gas collection along the left pelvic sidewall measuring up to 10 cm.  Moderate bilateral pleural effusions, underlying opacities favored to represent atelectasis.  Marked wall thickening of the sigmoid colon.  Peripheral low attenuation within the spleen concerning for splenic infarcts.  Wall thickening of the urinary bladder.   CT abdomen 9/6: Improving left lateral pelvic fluid collection, resolved peritoneal fluid in the left lower abdomen.  Concentric thickening of the sigmoid colon.  Decreased bilateral effusion and atelectasis.  CT ABD 9/9 Similar to slight decrease in size of ill-defined complex fluid and gas in the lateral left hemipelvis. 2. No evidence of contrast communication between the thickened sigmoid and this pelvic sidewall process. 3. Foley catheter and air within theurinary bladder, which is not  decompressed. Correlate with catheter function.   Assessment & Plan:   Acute on Chronic Hypoxemic Respiratory Failure s/p Trach (9/12) - in the setting of aspiration pneumonia / ARDS.    Right pleural effusion - No sig pocket to drain on bedside US 9/5.   P:  Trach collar as tolerated during the day PRVC ventilation at night PCCM will continue to monitor  Status post exploratory laparotomy for free air on 8/26 ; following bilateral salpingo-oophorectomy, omentectomy, and debulking surgery on 8/22. Developed fluid collections not amenable to IR drainage.  Reducing in size on CTA/P P:  Recommendations per GYN oncology  Severe Sepsis  - with neutropenia in setting of peritonitis (microperforation), complicated by aspiration pneumonia. P:  ID is following Per infectious disease  Sinus tachycardia Hypertension Grade 1 diastolic dysfunction  Elevated Troponin  - on admit resolved.  Likely demand Bradycardia  -Currently sinus rhythm Labile BP -Exacerbated by sedation P: Per TRH / primary SVC  Acute metabolic encephalopathy - in setting of critical illness, sedation.  Mental status improving.  P: RA SS goal: 0 Minimize sedation  Electrolyte Disturbances  P: Per [facility] hospitalist service  Severe Protein Calorie Malnutrition, prealbumin 3.5 P:  Per [facility] hospitalist service  Postoperative Anemia.  Recent Labs    02/23/18 0645 02/24/18 0330  HGB 10.0* 8.9*    - S/p 1 unit PRBC 02/19/18 P: By [facility] hospitalist service  Hyperglycemia P: Sliding scale insulin per primary  Best practice/Goals of care  DVT prophylaxis: LMWH GI prophylaxis: PPI Diet: Currently n.p.o. with NG tube in place to suction Mobility: BR Code Status: Full Code Family Communication: Patient has been updated at bedside 02/24/2018.   Disposition / Summary of Today's Plan 02/24/2018  Has been a full vent support for the last 48 hours due to abdominal distention.  We will attempt to place on  trach collar 02/24/2018.  Pulmonary critical care will continue to see 1-2 times a week.  Richardson Landry Millette Halberstam ACNP Maryanna Shape PCCM Pager [phone] till 1 pm If no answer page [phone] 02/24/2018, 8:21 AM

## 2018-02-24 NOTE — Progress Notes (Addendum)
Patient ID: Jo Parker, female   DOB: [birth year], 72 y.o.   MRN: [id number]         [facility] for Infectious Disease  Date of Admission:  01/27/2018           Day 25 vancomycin        Day 25 anidulafungin        Day 8 ampicillin sulbactam ASSESSMENT: She is showing slow but steady improvement.  I plan on continuing antibiotics for 3 more days.  PLAN: 1. Continue current antimicrobial therapy  Principal Problem:   Pelvic abscess in female Active Problems:   Severe sepsis ([place])   Peritonitis ([place])   HCAP (healthcare-associated pneumonia)   Postoperative wound infection   Primary peritoneal carcinomatosis ([place])   Ovarian cancer ([place])   Status post bilateral salpingo-oophorectomy   Severe protein-calorie malnutrition ([name])   COPD (chronic obstructive pulmonary disease) ([name])   HLD (hyperlipidemia)   Absolute anemia   White coat syndrome with hypertension   AKI (acute kidney injury) ([place])   Acute metabolic encephalopathy   Acute respiratory distress   Low urine output   Somnolence   At risk for fluid volume overload   Free intraperitoneal air   Acute on chronic respiratory failure ([name])   Pressure injury of skin   Aspiration pneumonia ([name])   Scheduled Meds: . chlorhexidine gluconate (MEDLINE KIT)  15 mL Mouth Rinse BID  . citalopram  20 mg Per Tube Daily  . enoxaparin (LOVENOX) injection  40 mg Subcutaneous Q24H  . free water  100 mL Per Tube Q8H  . furosemide  40 mg Intravenous Daily  . insulin aspart  0-20 Units Subcutaneous Q4H  . ipratropium-albuterol  3 mL Nebulization TID  . mouth rinse  15 mL Mouth Rinse 10 times per day  . multivitamin with minerals  1 tablet Oral Daily  . pantoprazole sodium  40 mg Per Tube BID  . potassium chloride  30 mEq Per Tube BID  . QUEtiapine  25 mg Per Tube Daily  . QUEtiapine  50 mg Oral QHS  . sodium chloride flush  10-40 mL Intracatheter Q12H   Continuous Infusions: . sodium chloride 10 mL/hr at 02/21/18 1626    . sodium chloride Stopped (02/23/18 1057)  . ampicillin-sulbactam (UNASYN) IV Stopped (02/24/18 0539)  . anidulafungin 100 mg (02/24/18 0910)  . vancomycin Stopped (02/23/18 1609)   PRN Meds:.sodium chloride, sodium chloride, acetaminophen (TYLENOL) oral liquid 160 mg/5 mL, fentaNYL (SUBLIMAZE) injection, hydrALAZINE, iopamidol, labetalol, metoprolol tartrate, ondansetron (ZOFRAN) IV, sodium chloride flush  Review of Systems: Review of Systems  Unable to perform ROS: Intubated    Allergies  Allergen Reactions  . Oxycodone     confusion    OBJECTIVE: Vitals:   02/24/18 0723 02/24/18 0734 02/24/18 0800 02/24/18 0900  BP:   (!) 152/69 (!) 170/75  Pulse:   100 (!) 105  Resp:   20 (!) 26  Temp: 99 F (37.2 C)     TempSrc: Oral     SpO2:  100% 100% 99%  Weight:      Height:       Body mass index is 21.44 kg/m.  Physical Exam  Constitutional:  She is alert and smiling.  Cardiovascular: Normal rate, regular rhythm and normal heart sounds.  No murmur heard. Pulmonary/Chest: She has no wheezes. She has no rales.    Abdominal: Soft. She exhibits distension. There is no tenderness.  There was 20 cc from the drain yesterday.  Skin: No rash  noted.    Lab Results Lab Results  Component Value Date   WBC 9.0 02/24/2018   HGB 8.9 (L) 02/24/2018   HCT 28.1 (L) 02/24/2018   MCV 81.2 02/24/2018   PLT 359 02/24/2018    Lab Results  Component Value Date   CREATININE 0.54 02/23/2018   BUN 14 02/23/2018   NA 145 02/23/2018   K 3.2 (L) 02/23/2018   CL 99 02/23/2018   CO2 35 (H) 02/23/2018    Lab Results  Component Value Date   ALT 24 02/23/2018   AST 26 02/23/2018   ALKPHOS 91 02/23/2018   BILITOT 0.6 02/23/2018     Microbiology: Recent Results (from the past 240 hour(s))  Culture, Urine     Status: None   Collection Time: 02/17/18 12:45 PM  Result Value Ref Range Status   Specimen Description   Final    URINE, CATHETERIZED Performed at [facility], [place] [address] [place], [place] [address]    Special Requests   Final    Immunocompromised Performed at [facility], [place] [address] [place], [place] [address]    Culture   Final    NO GROWTH Performed at [facility], [place] [address], [place], [place] [address]    Report Status 02/18/2018 FINAL  Final  Culture, blood (routine x 2)     Status: None   Collection Time: 02/17/18  1:12 PM  Result Value Ref Range Status   Specimen Description   Final    BLOOD RIGHT ANTECUBITAL Performed at [facility] [address] [place], [place] [address]    Special Requests   Final    BOTTLES DRAWN AEROBIC ONLY Blood Culture adequate volume Performed at [facility] [address] [place], [place] [address]    Culture   Final    NO GROWTH 5 DAYS Performed at [facility], [place] [address], [place], [place] [address]    Report Status 02/22/2018 FINAL  Final  Culture, blood (routine x 2)     Status: None   Collection Time: 02/17/18  1:14 PM  Result Value Ref Range Status   Specimen Description   Final    BLOOD RIGHT ANTECUBITAL Performed at [facility] [address], [place], [place] [address]    Special Requests   Final    BOTTLES DRAWN AEROBIC ONLY Blood Culture adequate volume Performed at [facility] [address] [place], [place] [address]    Culture   Final    NO GROWTH 5 DAYS Performed at [facility], [place] [address] [place], [place] [address]    Report Status 02/22/2018 FINAL  Final  C difficile quick scan w PCR reflex     Status: None   Collection Time: 02/17/18  3:08 PM  Result Value Ref Range Status   C Diff antigen NEGATIVE NEGATIVE Final   C Diff toxin NEGATIVE NEGATIVE Final   C Diff interpretation No C. difficile detected.  Final    Comment: Performed at [facility], [place] [address], [place],  [address]     Michel Bickers, [facility] [phone] pager   [phone] cell 02/24/2018, 9:46 AM

## 2018-02-24 NOTE — Progress Notes (Signed)
OT Cancellation Note  Patient Details Name: Jo Parker MRN: [id number] DOB: [birth year]   Cancelled Treatment:    Reason Eval/Treat Not Completed: Medical issues which prohibited therapy.  Pt on vent. Will check back.   Jrake Rodriquez 02/24/2018, 8:12 AM  Lesle Chris, OTR/L Acute Rehabilitation Services [phone] [facility] pager [phone] office 02/24/2018

## 2018-02-24 NOTE — Progress Notes (Signed)
Weaned from [phone] (7 hours) on 28% ATC.  Placed back on vent due to procedure.  Pt tolerated well with no complications.

## 2018-02-24 NOTE — Procedures (Signed)
Procedure Note:  02/24/18 Preop Dx: wound dehiscence Postop Dx: same Procedure: incision and drainage of abdominal wound Surgeon: Dorann Ou Assistant [name] NP EBL: minimal Specimens: none Procedure: Consent was obtained from patient's son, [name], who was present for the procedure. The patient also verbally consented to the procedure. A time out was verbally performed.  The patient received 2mg  Versed and 6mcg fentanyl IV. The skin was prepped with betadine. The incision was infiltrated along its length with 30cc of 2% plain lidocaine. The incision was probed and a defect under the skin was noted to track to the highest point of the incision (no suppuration or drainage). A 15 blade scalpel was used to open the skin and subcutaneous fat of the incision to unroof the tunnel entirely. The fascia was in tact.  The bleeding edges were made hemostatic with silver nitrate. The skin was packed with kerlex in a wet to dry dressing.  The patient tolerated the procedure well.  Thereasa Solo, MD

## 2018-02-24 NOTE — Progress Notes (Signed)
SLP Cancellation Note  Patient Details Name: Jo Parker MRN: [id number] DOB: March 03, 1946   Cancelled treatment:       Reason Eval/Treat Not Completed: Other (comment);Medical issues which prohibited therapy(pt requiring full vent support due to abdomen distention, will continue efforts)   Macario Golds 02/24/2018, 10:23 AM  Luanna Salk, MS [facility] SLP Acute Rehab Services Pager [phone] Office [phone]

## 2018-02-24 NOTE — Progress Notes (Addendum)
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  She is completed 3 cycles of chemotherapy and is now status post bilateral salpingo-oophorectomy and omentectomy with debulking on 8/22.   8/26 transferred to ICU w/ concern for perforation w/ free air noted.  Imipenem started. Went for exploratory laparotomy with washout.  No significant contamination identified, presumed spontaneously closed microperf. Returned with abdominal drains postoperative chest x-ray showing evidence of aspiration pneumonia; mixed respiratory and metabolic acidosis & in septic shock. Required intubation.   8/27 GI medicine consulted, did not feel she had overt GIB.  Rec's prophylaxis with PPI.  Signed off.  Weaning pressors. Hgb dropped from 8.8 to 7.4; got 2 units of blood,  febrile, added. vancomycin.  Neupogen given for neutropenia.   8/28-9/1 Shock resolved, TPN started. Fevers. Delirium barrier to weaning.  ABX for fever  9/02-9/6 Delirium. Sedation difficult, diuresing, remains on abx. spiking temps, loculated fluid collection identified in left pelvis. Not amedable to drainage.  ID consulted. Started seroquel. 9/4. Upper 2 drains removed. Staples removed.from incision site.  9/4-9/9 ID consulted by family.  Fevers ongoing, CT abd with improved fluid collections.  Failing PSV wean.  No pressors.  No sedation.   Husband deferring decisions about care to son (newly married).   9/10  Sister at bedside - says she is not in favor of trach but says is the decision of son/dpoa. Son met with Dr Alvy Bimler for goals of care - current status precludes chemo but goal is trach and full code and full medical care and later chemo when ECOG improves.  Low grade fever persists - ID following. More awake today.  No Perf leak on CT abd and restarted TF  9/12-9/14  s/p Trach - Dr Nelda Marseille. Levophed post trach.  Fevers. 1 unit PRBC,   9/14 - [facility] consulted for medical management   9/18 - CT abdomen with ileus   Subjective: Patient seen and examined, she continues to be distended.  No improvement in abdominal pain.  On vent overnight.  Tolerating well.  NG tube with high output.  Assessment & Plan:   Principal Problem:   Pelvic abscess in female Active Problems:   Primary peritoneal carcinomatosis ([place])   Severe protein-calorie malnutrition ([place])   Severe sepsis ([name])   COPD (chronic obstructive pulmonary disease) ([name])   HLD (hyperlipidemia)   Absolute anemia   White coat syndrome with hypertension   Ovarian cancer ([place])   AKI (acute kidney injury) ([place])   Acute metabolic encephalopathy   Acute respiratory distress   Low urine output   Somnolence   At risk for fluid volume overload   Free intraperitoneal air   Acute on chronic respiratory failure ([name])   Pressure injury of skin   Status post bilateral salpingo-oophorectomy   Peritonitis ([place])   HCAP (healthcare-associated pneumonia)   Postoperative wound infection   Aspiration pneumonia ([name])  Acute on chronic hypoxemic respiratory failure status post trach (9/12) In setting of aspiration pneumonia/ARDS Right-sided pleural effusion, per PCCM no significant pocket to drain on bedside ultrasound.  Patient tolerating ATC well during the day. Vent only at night. Continue pulmonary hygiene as able. Continue Lasix. Continue trach management per pulmonary team.  Status post exploratory laparotomy for free air on 8/26; following bilateral salpingo-oophorectomy, omentectomy and debulking surgery on 8/22.  Continue  management and wound care per OB/GYN/ONC  Abdominal pain/Ileus  CT abdomen shows persistent ileus.  We will keep patient n.p.o., per surgical team unclear if she really have some obstruction.  Keep NG tube to low suction.  Keep  potassium above 4 and magnesium above 2.  Will need to start nutrition.  TPA discussed  Severe sepsis pelvic abscess, peritonitis - sepsis physiology resolved Initially with peritonitis (microperforation) subsequently complicated by pneumonia. Being followed by ID.  Defer antibiotic therapy per ID and primary team.  HTN  BP remains labile, continue to need recurrent doses of labetalol, metoprolol and hydralazine.  Will schedule IV metoprolol 5 mg every 6 hours.  Continue as needed hydralazine and labetalol. Echo LVEF 65 to 70% with moderate LVH, grade 1 diastolic dysfunction.  Patient also on daily IV Lasix.  Severe protein calorie malnutrition Holding tube feeding due to abdominal distention/pain, likely malabsorption  Will start TPN per pharmacy consult.  Postoperative anemia Status post 1 unit PRBCs, hemoglobin stable Transfuse if hemoglobin less than 7.  Hypokalemia/hypomagnesemia Replete, check BMP and magnesium in a.m.  Acute metabolic encephalopathy - resolved In setting of critical illness and sedation. Will dc Celexa and Seroquel as patient not able to tolerate p.o. meds at this time.  If agitation or delirium will try IV Haldol.  Deconditioning  Patient will benefit from LTAC  Continue PT per ICU protocol   Code Status: Full Code  Family Communication: Husband at bedside    Procedures:     Antimicrobials: Anti-infectives (From admission, onward)   Start     Dose/Rate Route Frequency Ordered Stop   02/22/18 1400  vancomycin (VANCOCIN) IVPB 750 mg/150 ml premix  Status:  Discontinued     750 mg 150 mL/hr over 60 Minutes Intravenous Every 24 hours 02/22/18 1335 02/22/18 1337   02/22/18 1400  vancomycin (VANCOCIN) IVPB 1000 mg/200 mL premix     1,000 mg 200 mL/hr over 60 Minutes Intravenous Every 24 hours 02/22/18 1337     02/22/18 1200  vancomycin (VANCOCIN) IVPB 750 mg/150 ml premix  Status:  Discontinued     750 mg 150 mL/hr over 60 Minutes Intravenous Every 24  hours 02/22/18 0903 02/22/18 1243   02/17/18 1200  vancomycin (VANCOCIN) IVPB 750 mg/150 ml premix  Status:  Discontinued     750 mg 150 mL/hr over 60 Minutes Intravenous Every 24 hours 02/16/18 1518 02/22/18 0521   02/16/18 1800  Ampicillin-Sulbactam (UNASYN) 3 g in sodium chloride 0.9 % 100 mL IVPB     3 g 200 mL/hr over 30 Minutes Intravenous Every 6 hours 02/16/18 1619     02/07/18 1200  vancomycin (VANCOCIN) IVPB 1000 mg/200 mL premix  Status:  Discontinued     1,000 mg 200 mL/hr over 60 Minutes Intravenous Every 24 hours 02/06/18 1315 02/16/18 1518   02/06/18 1015  vancomycin (VANCOCIN) IVPB 1000 mg/200 mL premix     1,000 mg 200 mL/hr over 60 Minutes Intravenous STAT 02/06/18 1012 02/06/18 1211   02/02/18 1000  anidulafungin (ERAXIS) 100 mg in sodium chloride 0.9 % 100 mL IVPB     100 mg 78 mL/hr over 100 Minutes Intravenous Every 24 hours 02/01/18 0916     02/02/18 1000  vancomycin (VANCOCIN) IVPB 1000 mg/200 mL premix  Status:  Discontinued     1,000 mg 200 mL/hr over 60 Minutes Intravenous Every 24 hours 02/01/18 1039 02/04/18 0852   02/01/18 1000  anidulafungin (ERAXIS) 200 mg in sodium chloride 0.9 % 200 mL IVPB  200 mg 78 mL/hr over 200 Minutes Intravenous  Once 02/01/18 0916 02/01/18 1602   02/01/18 0830  vancomycin (VANCOCIN) 1,250 mg in sodium chloride 0.9 % 250 mL IVPB     1,250 mg 166.7 mL/hr over 90 Minutes Intravenous STAT 02/01/18 0822 02/01/18 1035   01/31/18 1800  meropenem (MERREM) 1 g in sodium chloride 0.9 % 100 mL IVPB  Status:  Discontinued     1 g 200 mL/hr over 30 Minutes Intravenous Every 8 hours 01/31/18 1012 02/16/18 1619   01/31/18 0900  meropenem (MERREM) 1 g in sodium chloride 0.9 % 100 mL IVPB     1 g 200 mL/hr over 30 Minutes Intravenous STAT 01/31/18 0852 01/31/18 1005   01/27/18 0600  cefOXitin (MEFOXIN) 2 g in sodium chloride 0.9 % 100 mL IVPB     2 g 200 mL/hr over 30 Minutes Intravenous On call to O.R. 01/27/18 0533 01/27/18 0753        Objective: Vitals:   02/24/18 1200 02/24/18 1205 02/24/18 1300 02/24/18 1422  BP: (!) 175/93  (!) 199/93   Pulse: (!) 106  (!) 106   Resp: (!) 27  (!) 27   Temp: 98.7 F (37.1 C)     TempSrc: Oral     SpO2: 100% 100% 100% 100%  Weight:      Height:        Intake/Output Summary (Last 24 hours) at 02/24/2018 1448 Last data filed at 02/24/2018 1300 Gross per 24 hour  Intake 1203.97 ml  Output 2620 ml  Net -1416.03 ml   Filed Weights   02/22/18 0336 02/23/18 0630 02/24/18 0441  Weight: 60.4 kg 60.4 kg 54.9 kg    Examination:  General: NAD HEENT: NG tube and trach in place Cardiovascular: RRR, S1/S2 +, no rubs, no gallops Respiratory: Decreased breath sounds, diffuse rhonchi. Abdominal: Abdomen firm, distended, diffuse tenderness.  Pain place Extremities: no pedal edema Skin: Dry, no rash  Data Reviewed: I have personally reviewed following labs and imaging studies  CBC: Recent Labs  Lab 02/20/18 0416 02/21/18 0323 02/22/18 0326 02/23/18 0645 02/24/18 0330  WBC 9.2 8.2 7.7 11.0* 9.0  NEUTROABS 6.4 6.2 5.0 8.0* 6.3  HGB 8.7* 8.5* 8.6* 10.0* 8.9*  HCT 27.3* 27.0* 27.7* 31.5* 28.1*  MCV 80.3 80.8 81.7 80.8 81.2  PLT 332 346 372 379 992   Basic Metabolic Panel: Recent Labs  Lab 02/19/18 0548 02/20/18 0416 02/21/18 0323 02/22/18 0326 02/23/18 0645 02/24/18 0330 02/24/18 1140  NA 142 145 141 147* 145  --  148*  K 2.6* 2.7* 2.8* 3.2* 3.2*  --  2.4*  CL 92* 97* 96* 100 99  --  115*  CO2 40* 38* 37* 36* 35*  --  27  GLUCOSE 113* 112* 151* 103* 119*  --  89  BUN '11 11 11 15 14  '$ --  11  CREATININE 0.45 0.41* 0.54 0.47 0.54  --  0.39*  CALCIUM 7.7* 8.2* 7.9* 8.6* 8.7*  --  6.6*  MG 1.6* 1.8 1.7 2.0 1.6* 1.9  --   PHOS 3.3  --  3.5 4.1 4.0 3.8  --    GFR: Estimated Creatinine Clearance: 52.6 mL/min (A) (by C-G formula based on SCr of 0.39 mg/dL (L)). Liver Function Tests: Recent Labs  Lab 02/20/18 0416 02/21/18 0323 02/22/18 0326 02/23/18 0645   AST '27 27 29 26  '$ ALT '23 21 23 24  '$ ALKPHOS 103 89 84 91  BILITOT 0.7 0.3 0.6 0.6  PROT 6.5  6.5 6.8 7.6  ALBUMIN 1.7* 1.6* 1.7* 1.9*   No results for input(s): LIPASE, AMYLASE in the last 168 hours. No results for input(s): AMMONIA in the last 168 hours. Coagulation Profile: No results for input(s): INR, PROTIME in the last 168 hours. Cardiac Enzymes: No results for input(s): CKTOTAL, CKMB, CKMBINDEX, TROPONINI in the last 168 hours. BNP (last 3 results) No results for input(s): PROBNP in the last 8760 hours. HbA1C: No results for input(s): HGBA1C in the last 72 hours. CBG: Recent Labs  Lab 02/23/18 1936 02/23/18 2358 02/24/18 0329 02/24/18 0723 02/24/18 1143  GLUCAP 96 103* 88 99 113*   Lipid Profile: No results for input(s): CHOL, HDL, LDLCALC, TRIG, CHOLHDL, LDLDIRECT in the last 72 hours. Thyroid Function Tests: No results for input(s): TSH, T4TOTAL, FREET4, T3FREE, THYROIDAB in the last 72 hours. Anemia Panel: No results for input(s): VITAMINB12, FOLATE, FERRITIN, TIBC, IRON, RETICCTPCT in the last 72 hours. Sepsis Labs: No results for input(s): PROCALCITON, LATICACIDVEN in the last 168 hours.  Recent Results (from the past 240 hour(s))  Culture, Urine     Status: None   Collection Time: 02/17/18 12:45 PM  Result Value Ref Range Status   Specimen Description   Final    URINE, CATHETERIZED Performed at [facility] [address] [place], [place] [address]    Special Requests   Final    Immunocompromised Performed at [facility], [place] [address] [place], [place] [address]    Culture   Final    NO GROWTH Performed at [facility], [place] [address] [place], [place] [address]    Report Status 02/18/2018 FINAL  Final  Culture, blood (routine x 2)     Status: None   Collection Time: 02/17/18  1:12 PM  Result Value Ref Range Status   Specimen Description   Final    BLOOD RIGHT ANTECUBITAL Performed at [facility] [address] [place], [place] [address]    Special Requests   Final    BOTTLES DRAWN AEROBIC ONLY Blood Culture adequate volume Performed at [facility] [address], [place], [place] [address]    Culture   Final    NO GROWTH 5 DAYS Performed at [facility], [place] [address] [place], [place] [address]    Report Status 02/22/2018 FINAL  Final  Culture, blood (routine x 2)     Status: None   Collection Time: 02/17/18  1:14 PM  Result Value Ref Range Status   Specimen Description   Final    BLOOD RIGHT ANTECUBITAL Performed at [facility] [address], [place], [place] [address]    Special Requests   Final    BOTTLES DRAWN AEROBIC ONLY Blood Culture adequate volume Performed at [facility] [address] [place], [place] [address]    Culture   Final    NO GROWTH 5 DAYS Performed at [facility], [place] [address], [place], [place] [address]    Report Status 02/22/2018 FINAL  Final  C difficile quick scan w PCR reflex     Status: None   Collection Time: 02/17/18  3:08 PM  Result Value Ref Range Status   C Diff antigen NEGATIVE NEGATIVE Final   C Diff toxin NEGATIVE NEGATIVE Final   C Diff interpretation No C. difficile detected.  Final    Comment: Performed at [facility], [place] [address], [place], [place] [address]  Radiology Studies: Dg Chest 1 View  Result Date: 02/23/2018 CLINICAL DATA:  Nasogastric tube placement. EXAM: CHEST  1 VIEW COMPARISON:  Chest radiograph February 22, 2018 FINDINGS: Nasogastric tube looped in proximal stomach. Stable appearance of tracheostomy tube, LEFT PICC, RIGHT single-lumen chest Port-A-Cath, pacemaker device versus loop monitor. Cardiomediastinal silhouette is normal. Calcified aortic arch. RIGHT upper lobe alveolar airspace opacity. Diffuse interstitial prominence with patchy RIGHT lower lobe airspace opacity. No  pneumothorax. Soft tissue planes and included osseous structures are unchanged. IMPRESSION: Nasogastric tube tip projects in proximal stomach, stable appearance of remaining life support lines. RIGHT upper lobe consolidation with RIGHT lower lobe atelectasis or early pneumonia. Similar interstitial prominence. Aortic Atherosclerosis (ICD10-I70.0). Electronically Signed   By: Elon Alas M.D.   On: 02/23/2018 15:29   Ct Abdomen Pelvis W Contrast  Result Date: 02/23/2018 CLINICAL DATA:  Increased abdominal distension with vomiting. Stage IV ovarian cancer status post debulking on 08/22 in subsequent re-exploration for concern of bowel perforation. EXAM: CT ABDOMEN AND PELVIS WITH CONTRAST TECHNIQUE: Multidetector CT imaging of the abdomen and pelvis was performed using the standard protocol following bolus administration of intravenous contrast. CONTRAST:  175m OMNIPAQUE IOHEXOL 300 MG/ML  SOLN COMPARISON:  02/11/2018.  Pelvis CT 02/14/2018. FINDINGS: Lower chest: Small bilateral pleural effusions with bibasilar dependent collapse/consolidation Hepatobiliary: No focal abnormality within the liver parenchyma. Gallbladder is distended and appears to track laterally up over the lateral margin of the liver. No intrahepatic or extrahepatic biliary dilation. Pancreas: No focal mass lesion. No dilatation of the main duct. No intraparenchymal cyst. No peripancreatic edema. Spleen: No splenomegaly. No focal mass lesion. Adrenals/Urinary Tract: No adrenal nodule or mass. Right kidney unremarkable. Stable central sinus cyst lower pole left kidney. No evidence for hydroureter. Bladder is distended and gas in the bladder lumen suggests recent instrumentation. Stomach/Bowel: Stomach is nondistended with NG tube in situ. Duodenum is normally positioned as is the ligament of Treitz. Duodenal diverticulum evident. Diffuse fluid-filled distended small bowel evident with proximal loops measuring up to 3.4 cm diameter. Colon  is diffusely gas filled and distended with non dependent segments (distal transverse) measuring up to 6 cm diameter. There is no evidence for small bowel or colonic wall thickening although some mild hyperenhancement is noted in the tip of the cecum. Rectal tube visualized in situ. Vascular/Lymphatic: There is abdominal aortic atherosclerosis without aneurysm. There is no gastrohepatic or hepatoduodenal ligament lymphadenopathy. No intraperitoneal or retroperitoneal lymphadenopathy. No pelvic sidewall lymphadenopathy. Reproductive: Uterus surgically absent.  There is no adnexal mass. Other: Small volume ascites is seen around the liver and spleen. There is edema diffusely in the mesentery, scattered areas of interloop mesenteric fluid, and trace fluid evident in each paracolic gutter. Small volume free fluid seen in the pelvis. Surgical drain noted looped in the left lower quadrant. 7.7 x 1.9 cm rim enhancing fluid collection tracks along the left pelvic sidewall. Another smaller rim enhancing fluid collection is seen medial to this on 67/2. Non dependent portion of this collection demonstrates a small gas bubble (69/2). Comparing to 02/11/2018 and 02/14/2018, the attenuation in this collection has decreased as has the volume of gas. Musculoskeletal: Degenerative changes noted in the left hip. No worrisome lytic or sclerotic osseous abnormality. Open midline wound evident. IMPRESSION: 1. Diffuse distention of large and small bowel. There are some small bowel loops in the right abdomen that are decompressed, but non dependent segments of colon are distended with gas. CT imaging features favor ileus although small bowel obstruction is possible.  2. Evolution of the left pelvic multifocal rim enhancing fluid collection. Gas within this collection has decreased in the interval. Material in this collection on the 02/11/2018 exam was higher in attenuation. This may represent an evolving hematoma although abscess remains a  consideration. 3. Similar intraperitoneal free fluid with interloop mesenteric fluid and mesenteric edema. 4. Open midline wound. 5. Small bilateral pleural effusions with bibasilar dependent collapse/consolidation in the lower lobes. Electronically Signed   By: Misty Stanley M.D.   On: 02/23/2018 13:46     Scheduled Meds: . chlorhexidine gluconate (MEDLINE KIT)  15 mL Mouth Rinse BID  . enoxaparin (LOVENOX) injection  40 mg Subcutaneous Q24H  . furosemide  40 mg Intravenous Daily  . insulin aspart  0-20 Units Subcutaneous Q4H  . ipratropium-albuterol  3 mL Nebulization TID  . lidocaine      . mouth rinse  15 mL Mouth Rinse 10 times per day  . metoprolol tartrate  5 mg Intravenous Q6H  . midazolam      . sodium chloride flush  10-40 mL Intracatheter Q12H   Continuous Infusions: . sodium chloride 10 mL/hr at 02/21/18 1626  . sodium chloride Stopped (02/23/18 1057)  . ampicillin-sulbactam (UNASYN) IV Stopped (02/24/18 1215)  . anidulafungin Stopped (02/24/18 1050)  . famotidine (PEPCID) IV Stopped (02/24/18 1254)  . potassium chloride 10 mEq (02/24/18 1418)  . vancomycin 1,000 mg (02/24/18 1419)     LOS: 28 days    Time spent: Total of 25 minutes spent with pt, greater than 50% of which was spent in discussion of  treatment, counseling and coordination of care   Chipper Oman, MD Pager: Text Page via [email or web address]   If 7PM-7AM, please contact night-coverage [email or web address] 02/24/2018, 2:48 PM   Note - This record has been created using [employer]. Chart creation errors have been sought, but may not always have been located. Such creation errors do not reflect on the standard of medical care.

## 2018-02-24 NOTE — Progress Notes (Addendum)
[facility] NOTE   Pharmacy Consult for TPN Indication: bowel perforation  Patient Measurements: Height: 5\' 3"  (160 cm) Weight: 121 lb 0.5 oz (54.9 kg) IBW/kg (Calculated) : 52.4 TPN AdjBW (KG): 58.3 Body mass index is 21.44 kg/m.  Insulin Requirements: 3 units resistant SSI (no meds PTA)  Current Nutrition: NPO   IVF: none currently  Central access: 02/01/2018 TPN start date: 8/27-9/5; restart 9/19  ASSESSMENT                                                                                                          HPI: Pt is a 72 year old with ovarian cancer status post bilateral salpingo-oophorectomy and omentectomy with debulking on 8/22. Ex lap on 8/26 and transferred to ICU afterward. Remains critically ill on vent support, now s/p trach placement. Was previously on TPN 8/27-9/5, however has lately had issues tolerating PO nutrition. TFs held since 9/17 d/t abdominal distention; CT abd shows persistent ileus. NG output remains high. [facility] resuming TPN per pharmacy.  Significant events:  - 8/27 Start TPN. Hold lipids for first 7 days while pt in ICU  - 8/30 scheduled Novolog insulin added q4h - 9/2 CT scan of abdomen shows undrained left pelvic fluid collection, IR consulted - defer drainage for now  - 9/4 Lipids started with TPN. Tube feeds (Vital AF 1.2 Cal) initiated at 56mL/hr, order to increase by 38mL/hr every 8 hours to goal rate of 76mL/hr  - 9/5 TPN weaned off - 9/19 resume TPN w/o lipids  Today:   Glucose - CBGs stable in the 80-120 range prior to resuming TPN  Electrolytes - Na rising, borderline high. K lower despite repletion yesterday, Cl rising as bicarb trends back down, corr Ca slightly low. Mg, Phos wnl  Renal - SCr low, relatively stable, UOP adequate, bicarb improved, BUN stable WNL  I/O: UOP adequate  LFTs - WNL except low albumin  TGs - pending  Prealbumin - pending  NUTRITIONAL GOALS                                                                                              RD recs (8/27):  Kcal:  1750-1925 (30-33 kcal/kg) Protein:  82-93 grams (1.4-1.6 grams/kg) Fluid:  >/= 1.5 L/day  Clinimix 5/15 @ 75 mL/hr and 20% fat emulsion at 240 ml/day to provide 90 grams protein/day and 1758 kcal/day  PLAN  KCl 40 mEq and Mg 2g given earlier today per MD Will give an additional KCl 10 mEq x4  IV lipids were held for 7d with previous TPN and started on 9/4, lasting only one day. Given the patient is still critically ill and only  recently off of tube feedings, will reinitiate TPN without lipids.                                                                                           At 1800 today:  Start Clinimix E 5/15 at 40 ml/hr  TPN to contain standard multivitamins and trace elements  Hold 20% fat emulsion for now; may consider initiating sooner than 7d based on clinical situation/stability, etc.  Would add famotidine starting with tomorrow's TPN  Continue resistant SSI with q4 hr CBG checks  TPN lab panels on Mondays & Thursdays.  F/u daily.   Reuel Boom, PharmD, BCPS [phone] 02/24/2018, 5:09 PM

## 2018-02-24 NOTE — Progress Notes (Signed)
Patient ID: Jo Parker, female   DOB: 07-18-1945, 72 y.o.   MRN: [id number]         [facility] for Infectious Disease  Date of Admission:  01/27/2018           Day 25 vancomycin        Day 25 anidulafungin        Day 8 ampicillin sulbactam ASSESSMENT: She is showing slow but steady improvement.  I plan on continuing antibiotics for 3 more days.  PLAN: 1. Continue current antimicrobial therapy  Principal Problem:   Pelvic abscess in female Active Problems:   Severe sepsis ([place])   Peritonitis ([place])   HCAP (healthcare-associated pneumonia)   Postoperative wound infection   Primary peritoneal carcinomatosis ([place])   Ovarian cancer ([place])   Status post bilateral salpingo-oophorectomy   Severe protein-calorie malnutrition ([name])   COPD (chronic obstructive pulmonary disease) ([name])   HLD (hyperlipidemia)   Absolute anemia   White coat syndrome with hypertension   AKI (acute kidney injury) ([place])   Acute metabolic encephalopathy   Acute respiratory distress   Low urine output   Somnolence   At risk for fluid volume overload   Free intraperitoneal air   Acute on chronic respiratory failure ([name])   Pressure injury of skin   Aspiration pneumonia ([name])   Scheduled Meds: . chlorhexidine gluconate (MEDLINE KIT)  15 mL Mouth Rinse BID  . citalopram  20 mg Per Tube Daily  . enoxaparin (LOVENOX) injection  40 mg Subcutaneous Q24H  . free water  100 mL Per Tube Q8H  . furosemide  40 mg Intravenous Daily  . insulin aspart  0-20 Units Subcutaneous Q4H  . ipratropium-albuterol  3 mL Nebulization TID  . mouth rinse  15 mL Mouth Rinse 10 times per day  . multivitamin with minerals  1 tablet Oral Daily  . pantoprazole sodium  40 mg Per Tube BID  . potassium chloride  30 mEq Per Tube BID  . QUEtiapine  25 mg Per Tube Daily  . QUEtiapine  50 mg Oral QHS  . sodium chloride flush  10-40 mL Intracatheter Q12H   Continuous Infusions: . sodium chloride 10 mL/hr at 02/21/18 1626    . sodium chloride Stopped (02/23/18 1057)  . ampicillin-sulbactam (UNASYN) IV Stopped (02/24/18 0539)  . anidulafungin 100 mg (02/24/18 0910)  . vancomycin Stopped (02/23/18 1609)   PRN Meds:.sodium chloride, sodium chloride, acetaminophen (TYLENOL) oral liquid 160 mg/5 mL, fentaNYL (SUBLIMAZE) injection, hydrALAZINE, iopamidol, labetalol, metoprolol tartrate, ondansetron (ZOFRAN) IV, sodium chloride flush  Review of Systems: Review of Systems  Unable to perform ROS: Intubated    Allergies  Allergen Reactions  . Oxycodone     confusion    OBJECTIVE: Vitals:   02/24/18 0700 02/24/18 0723 02/24/18 0734 02/24/18 0800  BP: (!) 148/77   (!) 152/69  Pulse: 92   100  Resp: (!) 21   20  Temp:  99 F (37.2 C)    TempSrc:  Oral    SpO2: 100%  100% 100%  Weight:      Height:       Body mass index is 21.44 kg/m.  Physical Exam  Constitutional:  She is alert and smiling.  Cardiovascular: Normal rate, regular rhythm and normal heart sounds.  No murmur heard. Pulmonary/Chest: She has no wheezes. She has no rales.    Abdominal: Soft. She exhibits distension. There is no tenderness.  There was 20 cc from the drain yesterday.  Skin: No rash noted.  Lab Results Lab Results  Component Value Date   WBC 9.0 02/24/2018   HGB 8.9 (L) 02/24/2018   HCT 28.1 (L) 02/24/2018   MCV 81.2 02/24/2018   PLT 359 02/24/2018    Lab Results  Component Value Date   CREATININE 0.54 02/23/2018   BUN 14 02/23/2018   NA 145 02/23/2018   K 3.2 (L) 02/23/2018   CL 99 02/23/2018   CO2 35 (H) 02/23/2018    Lab Results  Component Value Date   ALT 24 02/23/2018   AST 26 02/23/2018   ALKPHOS 91 02/23/2018   BILITOT 0.6 02/23/2018     Microbiology: Recent Results (from the past 240 hour(s))  Culture, Urine     Status: None   Collection Time: 02/17/18 12:45 PM  Result Value Ref Range Status   Specimen Description   Final    URINE, CATHETERIZED Performed at [facility], [place] [address], [place], [place] [address]    Special Requests   Final    Immunocompromised Performed at [facility], [place] [address] [place], [place] [address]    Culture   Final    NO GROWTH Performed at [facility], [place] [address], [place], [place] [address]    Report Status 02/18/2018 FINAL  Final  Culture, blood (routine x 2)     Status: None   Collection Time: 02/17/18  1:12 PM  Result Value Ref Range Status   Specimen Description   Final    BLOOD RIGHT ANTECUBITAL Performed at [place] [address] [place], [place] [address]    Special Requests   Final    BOTTLES DRAWN AEROBIC ONLY Blood Culture adequate volume Performed at [place] [facility] [address], [place], [place] [address]    Culture   Final    NO GROWTH 5 DAYS Performed at [facility], [place] [address], [place], [place] [address]    Report Status 02/22/2018 FINAL  Final  Culture, blood (routine x 2)     Status: None   Collection Time: 02/17/18  1:14 PM  Result Value Ref Range Status   Specimen Description   Final    BLOOD RIGHT ANTECUBITAL Performed at [place] [address], [place], [place] [address]    Special Requests   Final    BOTTLES DRAWN AEROBIC ONLY Blood Culture adequate volume Performed at [place] [address], [place], [place] [address]    Culture   Final    NO GROWTH 5 DAYS Performed at [facility], [place] [address] [place], [place] [address]    Report Status 02/22/2018 FINAL  Final  C difficile quick scan w PCR reflex     Status: None   Collection Time: 02/17/18  3:08 PM  Result Value Ref Range Status   C Diff antigen NEGATIVE NEGATIVE Final   C Diff toxin NEGATIVE NEGATIVE Final   C Diff interpretation No C. difficile detected.  Final    Comment: Performed at [facility], [place] [address], [place],  [address]     Michel Bickers, [place] [facility] [phone] pager   [phone] cell 02/24/2018, 9:36 AM

## 2018-02-24 NOTE — Progress Notes (Addendum)
Gynecologic Oncology Progress Note  72 year old female diagnosed with Stage IV ovarian cancer who received four cycles of neoadjuvant chemotherapy with carboplatin and taxol with fourth cycle completed on 12/20/17 .  She underwent interval debulking surgery with Dr. Skeet Latch on 01/27/18 consisting of an exploratory laparotomy infra-gastric omentectomy bilateral salpingo-oophorectomy radical optimal tumor debulking, resection of anterior abdominal wall.  Her post-operative course was complicated by findings to suggest bowel perforation on CT from 01/31/18 post-op with no site visualized on re-operation. It was felt that aspiration most likely occurred during intubation with the re-operation.          Subjective: Patient alert. Attempts to answer questions.  Nodding that her abdomen feels the same.  Does not feel relief in abdominal symptoms at this time. Nodding to respond.  Husband at the bedside. Spoke with son, [name] on the phone.     Objective: Vital signs in last 24 hours: Temp:  [97.5 F (36.4 C)-99 F (37.2 C)] 99 F (37.2 C) (09/19 0723) Pulse Rate:  [92-127] 105 (09/19 0900) Resp:  [13-26] 26 (09/19 0900) BP: (125-196)/(51-107) 170/75 (09/19 0900) SpO2:  [97 %-100 %] 99 % (09/19 0900) FiO2 (%):  [30 %] 30 % (09/19 0734) Weight:  [121 lb 0.5 oz (54.9 kg)] 121 lb 0.5 oz (54.9 kg) (09/19 0441) Last BM Date: 02/24/18  Intake/Output from previous day: 09/18 0701 - 09/19 0700 In: 1204 [I.V.:120.5; NG/GT:50; IV Piggyback:1033.5] Out: 1820 [Urine:1650; Emesis/NG output:150; Drains:20]  Physical Examination: General: alert, appears comfortable, nodding head when asked questions Cardio: Tachycardic at 106 bpm. Resp: Coarse breath sounds but clear.  Trach in place. GI: Abdomen tight, distended with active bowel sounds.  Tympanic on percussion. NG to suction with 10 cc yellowish output in canister. Flexiseal in place with light brown liquid stool in bag. Dressing changed without  difficulty.  Granulation tissue noted on the fascia and the wound bed.  Packing with saline dampened kerlix.  LLQ JP stripped with thick, tan colored output (around 5cc in the drain).  There is no leaking around the tubing entry site. Prior JP sites are healing with no drainage or erythema. Upper aspect of the incision with steri strips in place. Extremities: generalized edema, non-pitting.   Labs: WBC/Hgb/Hct/Plts:  9.0/8.9/28.1/359 (09/19 0330)    Assessment: 72 y.o. s/p Procedure(s): Interval debulking surgery with Dr. Skeet Latch on 01/27/18 consisting of an exploratory laparotomy infra-gastric omentectomy bilateral salpingo-oophorectomy radical optimal tumor debulking, resection of anterior abdominal wall.  Reoperation of EXPLORATORY LAPAROTOMY, ABDOMINAL WASHOUT on 01/31/18:   Heme:  Post-operative anemia: Hgb 8.9 and Hct 28.1 this am. Last transfusion 02/19/18.  Anemia related to acute critical illness, no evidence of active bleeding.   ID: Fevers improving. WBC 9.0 this am.  Highest temp on 02/23/18 was 99.   CT AP from 9/18: 1. Diffuse distention of large and small bowel. There are some small bowel loops in the right abdomen that are decompressed, but non dependent segments of colon are distended with gas. CT imaging features favor ileus although small bowel obstruction is possible. 2. Evolution of the left pelvic multifocal rim enhancing fluid collection. Gas within this collection has decreased in the interval. Material in this collection on the 02/11/2018 exam was higher in attenuation. This may represent an evolving hematoma although abscess remains a consideration. 3. Similar intraperitoneal free fluid with interloop mesenteric fluid and mesenteric edema. 4. Open midline wound. 5. Small bilateral pleural effusions with bibasilar dependent collapse/consolidation in the lower lobes.  Urine for culture  with no growth and stool for c diff negative from 02/17/18. Blood cultures drawn 02/17/18  with no growth.  Presumed sepsis initially from gastrointestinal source but unable to identify the perforation after exp lap on 01/31/18. Contribution now from pneumonia and possibly undrained left pelvic fluid collection seen on CT scan 02/07/18; however the pelvic collection is improving. 02/13/18 enteric versus old hematoma appearing contents in JP. Cultures showing FEW WBC PRESENT, PREDOMINANTLY PMN MODERATE GRAM NEGATIVE RODS FEW GRAM POSITIVE RODS.   Repeat CT 02/14/18: Similar to slight decrease in size of ill-defined complex fluid and gas in the lateral left hemipelvis. 2. No evidence of contrast communication between the thickened sigmoid and this pelvic sidewall process. 3. Foley catheter and air within the urinary bladder, which is not decompressed. Correlate with catheter function.   Granix received 02/01/18 per Dr. Alvy Bimler.  Vancomycin stopped on 02/04/18 and then fevers began again. Restarted Vancomycin on 02/06/18. Anidulafungin initiated 02/01/18.  Meropenum IV started 01/31/18 and discontinued on 02/16/18.  Unasyn started 02/16/18.  Blood cultures from 01/31/18 & 02/06/18 &  02/17/18 with no growth. Tracheal aspirate from 02/01/18 resulting few gram positive rods. Repeated 02/06/18 showed few yeast.  IR feels no safe window for drainage of pelvic fluid collection.  Repeat imaging showed improvement from 9/18.  CV: Tachycardia managed per Hospitalist. Telemetry, diuresis-daily IV lasix ordered, lopressor/labetalol/hydralazine ordered PRN.  Resp: ARDS in setting of aspiration pneumonia and sepsis.   Chest 1 view 02/23/18: IMPRESSION: Nasogastric tube tip projects in proximal stomach, stable appearance of remaining life support lines.  RIGHT upper lobe consolidation with RIGHT lower lobe atelectasis or early pneumonia. Similar interstitial prominence.  Aortic Atherosclerosis (ICD10-I70.0). Plan per CC- ATC during the day and rest on PRVC overnight.  GI: Increased abdominal distension- CT AP 9/18-  ileus. Tube feedings on hold as of evening 9/17 due to moderate abdominal distension and episode of emesis.  -Abd 1 view from am 9/17: IMPRESSION: Continued diffuse gaseous distention of bowel, stable, favor ileus. -C diff negative. Flexiseal in place. -CT AP on 01/31/18 with findings consistent with GI perforation.  No obvious perforation identified on re-operation on 01/31/18. -TPN initiated 02/02/18 and discontinued 02/10/18. -02/13/18 -Enteric versus old hematoma contents in LLQ JP.  -CT pelvis 02/14/18: Stable to slight decrease in pelvic fluid collection, no evidence of contrast communication. Repeat CT AP 9/18- ileus -JP fluid culture from 02/13/18:  FEW WBC PRESENT, PREDOMINANTLY PMN  MODERATE GRAM NEGATIVE RODS FEW GRAM POSITIVE RODS  GU: AKI post-op resolved. Purewick in place to suction.   FEN: Replacement of K+ per Hospitalist /CC- Phos 3.80, Mag 1.9 this am. No metabolic panel drawn this am.  Will order.  Prophylaxis: SCDs in place. Continue Lovenox prophylaxis. No active bleeding.  Plan: Meds per tube to be changed to IV if possible due to absorption issues, per Hospitalist IVF consideration per Hospitalist due to discontinuation of free water via tube OT ordered per PT recommendation.   Continue care per Hospitalist Team, CC, ID, and Dr. Denman George. Continue wet to dry dressing changes twice daily.  Dr. Skeet Latch to see patient later today     Dorothyann Gibbs 02/24/2018, 10:17 AM

## 2018-02-24 NOTE — Progress Notes (Signed)
PT Cancellation Note  Patient Details Name: Jo Parker MRN: [id number] DOB: 1946-01-08   Cancelled Treatment:    Reason Eval/Treat Not Completed: Medical issues which prohibited therapy Pt has been requiring full vent support for the last 48 hours due to abdominal distention.  Plan is to attempt to place on trach collar 02/24/2018.  Will check back as schedule permits.   Jonan Seufert,KATHrine E 02/24/2018, 8:39 AM Carmelia Bake, PT, DPT Acute Rehabilitation Services Office: [phone] Pager: [phone]

## 2018-02-24 NOTE — Progress Notes (Signed)
CRITICAL VALUE ALERT  Critical Value:  Potassium 2.4  Date & Time Notied: 01230 02/24/18  Provider Notified: Dr. Quincy Simmonds  Orders Received/Actions taken: new orders given

## 2018-02-25 ENCOUNTER — Inpatient Hospital Stay (HOSPITAL_COMMUNITY): Payer: Medicare PPO

## 2018-02-25 DIAGNOSIS — T8131XA Disruption of external operation (surgical) wound, not elsewhere classified, initial encounter: Secondary | ICD-10-CM

## 2018-02-25 LAB — LIPID PANEL
CHOL/HDL RATIO: 4.3 ratio
CHOLESTEROL: 119 mg/dL (ref 0–200)
HDL: 28 mg/dL — ABNORMAL LOW (ref >40)
LDL CALC: 73 mg/dL (ref 0–99)
Triglycerides: 90 mg/dL (ref <150)
VLDL: 18 mg/dL (ref 0–40)

## 2018-02-25 LAB — CBC WITH DIFFERENTIAL/PLATELET
Basophils Absolute: 0 10*3/uL (ref 0.0–0.1)
Basophils Relative: 0 %
EOS ABS: 0.2 10*3/uL (ref 0.0–0.7)
EOS PCT: 3 %
HEMATOCRIT: 29.3 % — AB (ref 36.0–46.0)
Hemoglobin: 8.9 g/dL — ABNORMAL LOW (ref 12.0–15.0)
LYMPHS ABS: 1.5 10*3/uL (ref 0.7–4.0)
LYMPHS PCT: 16 %
MCH: 25 pg — AB (ref 26.0–34.0)
MCHC: 30.4 g/dL (ref 30.0–36.0)
MCV: 82.3 fL (ref 78.0–100.0)
MONO ABS: 0.8 10*3/uL (ref 0.1–1.0)
Monocytes Relative: 8 %
Neutro Abs: 6.8 10*3/uL (ref 1.7–7.7)
Neutrophils Relative %: 73 %
PLATELETS: 347 10*3/uL (ref 150–400)
RBC: 3.56 MIL/uL — ABNORMAL LOW (ref 3.87–5.11)
RDW: 22.4 % — AB (ref 11.5–15.5)
WBC: 9.3 10*3/uL (ref 4.0–10.5)

## 2018-02-25 LAB — GLUCOSE, CAPILLARY
GLUCOSE-CAPILLARY: 119 mg/dL — AB (ref 70–99)
GLUCOSE-CAPILLARY: 179 mg/dL — AB (ref 70–99)
GLUCOSE-CAPILLARY: 164 mg/dL — AB (ref 70–99)
Glucose-Capillary: 129 mg/dL — ABNORMAL HIGH (ref 70–99)
Glucose-Capillary: 147 mg/dL — ABNORMAL HIGH (ref 70–99)

## 2018-02-25 LAB — COMPREHENSIVE METABOLIC PANEL
ALT: 18 U/L (ref 0–44)
AST: 19 U/L (ref 15–41)
Albumin: 2 g/dL — ABNORMAL LOW (ref 3.5–5.0)
Alkaline Phosphatase: 79 U/L (ref 38–126)
Anion gap: 8 (ref 5–15)
BILIRUBIN TOTAL: 0.8 mg/dL (ref 0.3–1.2)
BUN: 14 mg/dL (ref 8–23)
CHLORIDE: 105 mmol/L (ref 98–111)
CO2: 32 mmol/L (ref 22–32)
Calcium: 8.7 mg/dL — ABNORMAL LOW (ref 8.9–10.3)
Creatinine, Ser: 0.48 mg/dL (ref 0.44–1.00)
GFR calc non Af Amer: >60 mL/min (ref >60)
Glucose, Bld: 150 mg/dL — ABNORMAL HIGH (ref 70–99)
POTASSIUM: 3.4 mmol/L — AB (ref 3.5–5.1)
Sodium: 145 mmol/L (ref 135–145)
TOTAL PROTEIN: 7.5 g/dL (ref 6.5–8.1)

## 2018-02-25 LAB — PHOSPHORUS: Phosphorus: 3.6 mg/dL (ref 2.5–4.6)

## 2018-02-25 LAB — MAGNESIUM: MAGNESIUM: 1.9 mg/dL (ref 1.7–2.4)

## 2018-02-25 LAB — PREALBUMIN: Prealbumin: 10.6 mg/dL — ABNORMAL LOW (ref 18–38)

## 2018-02-25 LAB — TRIGLYCERIDES: Triglycerides: 94 mg/dL (ref <150)

## 2018-02-25 MED ORDER — MAGNESIUM SULFATE 2 GM/50ML IV SOLN
2.0000 g | Freq: Once | INTRAVENOUS | Status: AC
  Administered 2018-02-25: 2 g via INTRAVENOUS
  Filled 2018-02-25: qty 50

## 2018-02-25 MED ORDER — TRACE MINERALS CR-CU-MN-SE-ZN 10-1000-500-60 MCG/ML IV SOLN
INTRAVENOUS | Status: AC
  Administered 2018-02-25: 19:00:00 via INTRAVENOUS
  Filled 2018-02-25: qty 1800

## 2018-02-25 MED ORDER — METOPROLOL TARTRATE 5 MG/5ML IV SOLN
5.0000 mg | Freq: Four times a day (QID) | INTRAVENOUS | Status: DC
  Administered 2018-02-25 – 2018-02-28 (×12): 5 mg via INTRAVENOUS
  Filled 2018-02-25 (×12): qty 5

## 2018-02-25 MED ORDER — FAT EMULSION PLANT BASED 20 % IV EMUL
240.0000 mL | INTRAVENOUS | Status: AC
  Administered 2018-02-25: 240 mL via INTRAVENOUS
  Filled 2018-02-25: qty 250

## 2018-02-25 MED ORDER — POTASSIUM CHLORIDE 10 MEQ/100ML IV SOLN
10.0000 meq | INTRAVENOUS | Status: AC
  Administered 2018-02-25 (×4): 10 meq via INTRAVENOUS
  Filled 2018-02-25 (×4): qty 100

## 2018-02-25 MED ORDER — ENALAPRILAT 1.25 MG/ML IV SOLN
0.6250 mg | Freq: Four times a day (QID) | INTRAVENOUS | Status: DC
  Administered 2018-02-25 – 2018-02-28 (×12): 0.625 mg via INTRAVENOUS
  Filled 2018-02-25 (×12): qty 1

## 2018-02-25 MED ORDER — METOPROLOL TARTRATE 5 MG/5ML IV SOLN: 10.0000 mg | Freq: Four times a day (QID) | INTRAVENOUS | Status: DC

## 2018-02-25 NOTE — Progress Notes (Signed)
  Speech Language Pathology Treatment: [name] Speaking valve  Patient Details Name: Jo Parker MRN: [id number] DOB: [birth year] Today's Date: 02/25/2018 Time: 1660-6004 SLP Time Calculation (min) (ACUTE ONLY): 35 min  Assessment / Plan / Recommendation Clinical Impression  Pt is making progress toward goals.  She presents with significant delay in response time, decreased initiation and spontaneity.  Follows simple commands after long delay; often needs modeling.  RT present for tracheal suctioning; cuff deflated.  PMV placed - pt maintained SP02 between 95-100%; RR fluctuated between 20-30; HR stable.  PMV removed at intervals - no air trapping evident.  With max verbal cues/modeling, pt hummed, produced "ah" to pitch with audible voicing but low volume and hoarse quality. Did not imitate word productions nor produce automatic sequences (e.g., counting) despite multimodal cues across multiple opportunities.  Pt's husband and son present; she was smiling and producing facial expressions, but no functional verbal communication could be elicited. Pt was left with PT/OT; valve was removed and left in room; cuff re-inflated to baseline.   SLP will continue to follow for PMV toleration.  Continue PMV trials with SLP only.    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  She is completed 3 cycles of chemotherapy and is now status post bilateral salpingo-oophorectomy and omentectomy with debulking on 8/22.   Post op complication for possible bowel obstruction and presumed aspiration during intubation.   Acute on chronic hypoxemic respiratory failure requiring prolonged intubation (Pt was intubated 8/26 and tracheostomy was placed 9/14) and now tracheostomy, tolerating TCT and Vent support at night.  Order received for PMSV trial.   Pt on 28% trach collar      SLP Plan  Continue with current plan of care       Recommendations          Patient may use Passy-Muir Speech Valve: with SLP only         Oral Care Recommendations: Oral care QID Follow up Recommendations: Other (comment)(tba) SLP Visit Diagnosis: Aphonia (R49.1) Plan: Continue with current plan of care       [name]. [name], [place] CCC/SLP Acute Rehabilitation Services Office number [phone]   Jo Parker 02/25/2018, 2:43 PM

## 2018-02-25 NOTE — Progress Notes (Signed)
Patient ID: Jo Parker, female   DOB: 30-Jun-1945, 72 y.o.   MRN: [id number]         [facility] for Infectious Disease  Date of Admission:  01/27/2018           Day 26 vancomycin        Day 26 anidulafungin        Day 9 ampicillin sulbactam ASSESSMENT: She remains afebrile.  I will continue her current antimicrobial therapy through the upcoming weekend.  I will follow-up on Monday, 02/28/2018, and probably stop therapy at that time.  Please call my partner, Dr. Talbot Grumbling [phone]), for any infectious disease questions this weekend.  PLAN: 1. Continue current antimicrobial therapy  Principal Problem:   Pelvic abscess in female Active Problems:   Severe sepsis ([place])   Peritonitis ([place])   HCAP (healthcare-associated pneumonia)   Postoperative wound infection   Primary peritoneal carcinomatosis ([place])   Ovarian cancer ([place])   Status post bilateral salpingo-oophorectomy   Severe protein-calorie malnutrition ([name])   COPD (chronic obstructive pulmonary disease) ([name])   HLD (hyperlipidemia)   Absolute anemia   White coat syndrome with hypertension   AKI (acute kidney injury) ([place])   Acute metabolic encephalopathy   Acute respiratory distress   Low urine output   Somnolence   At risk for fluid volume overload   Free intraperitoneal air   Acute on chronic respiratory failure ([name])   Pressure injury of skin   Aspiration pneumonia ([name])   Wound dehiscence, surgical, initial encounter   Scheduled Meds: . chlorhexidine gluconate (MEDLINE KIT)  15 mL Mouth Rinse BID  . enalaprilat  0.625 mg Intravenous Q6H  . enoxaparin (LOVENOX) injection  40 mg Subcutaneous Q24H  . furosemide  40 mg Intravenous Daily  . insulin aspart  0-20 Units Subcutaneous Q4H  . ipratropium-albuterol  3 mL Nebulization TID  . mouth rinse  15 mL Mouth Rinse 10 times per day  . metoprolol tartrate  5 mg Intravenous Q6H  . sodium chloride flush  10-40 mL Intracatheter Q12H   Continuous  Infusions: . [place] KitchenTPN (CLINIMIX-E) Adult 40 mL/hr at 02/25/18 1322  . sodium chloride 10 mL/hr at 02/21/18 1626  . sodium chloride Stopped (02/23/18 1057)  . ampicillin-sulbactam (UNASYN) IV Stopped (02/25/18 1301)  . anidulafungin Stopped (02/25/18 1214)  . [place] KitchenTPN (CLINIMIX-E) Adult     And  . Fat emulsion    . vancomycin 1,000 mg (02/25/18 1326)   PRN Meds:.sodium chloride, sodium chloride, acetaminophen (TYLENOL) oral liquid 160 mg/5 mL, fentaNYL (SUBLIMAZE) injection, hydrALAZINE, iopamidol, labetalol, ondansetron (ZOFRAN) IV, sodium chloride flush  Review of Systems: Review of Systems  Unable to perform ROS: Intubated    Allergies  Allergen Reactions  . Oxycodone     confusion    OBJECTIVE: Vitals:   02/25/18 1100 02/25/18 1143 02/25/18 1200 02/25/18 1337  BP: (!) 156/69  (!) 141/76   Pulse: (!) 109 (!) 114 92   Resp: (!) 25 (!) 25 (!) 23   Temp:   99.2 F (37.3 C)   TempSrc:   Axillary   SpO2: 100% 100% 99% 100%  Weight:      Height:       Body mass index is 21.28 kg/m.  Physical Exam  Constitutional:  She is alert and responsive.  Cardiovascular: Normal rate, regular rhythm and normal heart sounds.  No murmur heard. Pulmonary/Chest: She has no wheezes. She has no rales.    Abdominal: Soft. She exhibits distension. There is no tenderness.  A portion of her wound that was tunneling was opened yesterday.  Her nurse reports that the wound looks good.  She now has a wound VAC in place.  Skin: No rash noted.    Lab Results Lab Results  Component Value Date   WBC 9.3 02/25/2018   HGB 8.9 (L) 02/25/2018   HCT 29.3 (L) 02/25/2018   MCV 82.3 02/25/2018   PLT 347 02/25/2018    Lab Results  Component Value Date   CREATININE 0.48 02/25/2018   BUN 14 02/25/2018   NA 145 02/25/2018   K 3.4 (L) 02/25/2018   CL 105 02/25/2018   CO2 32 02/25/2018    Lab Results  Component Value Date   ALT 18 02/25/2018   AST 19 02/25/2018   ALKPHOS 79 02/25/2018   BILITOT  0.8 02/25/2018     Microbiology: Recent Results (from the past 240 hour(s))  Culture, Urine     Status: None   Collection Time: 02/17/18 12:45 PM  Result Value Ref Range Status   Specimen Description   Final    URINE, CATHETERIZED Performed at [facility], [place] [address] [place], [place] [address]    Special Requests   Final    Immunocompromised Performed at [facility], [place] [address] [place], [place] [address]    Culture   Final    NO GROWTH Performed at [facility], [place] [address], [place], [place] [address]    Report Status 02/18/2018 FINAL  Final  Culture, blood (routine x 2)     Status: None   Collection Time: 02/17/18  1:12 PM  Result Value Ref Range Status   Specimen Description   Final    BLOOD RIGHT ANTECUBITAL Performed at [facility] [place] [address] [place], [place] [address]    Special Requests   Final    BOTTLES DRAWN AEROBIC ONLY Blood Culture adequate volume Performed at [place] [address], [place], [place] [address]    Culture   Final    NO GROWTH 5 DAYS Performed at [facility], [place] [address] [place], [place] [address]    Report Status 02/22/2018 FINAL  Final  Culture, blood (routine x 2)     Status: None   Collection Time: 02/17/18  1:14 PM  Result Value Ref Range Status   Specimen Description   Final    BLOOD RIGHT ANTECUBITAL Performed at [facility] [address], [place], [place] [address]    Special Requests   Final    BOTTLES DRAWN AEROBIC ONLY Blood Culture adequate volume Performed at [place] [address] [place], [place] [address]    Culture   Final    NO GROWTH 5 DAYS Performed at [facility], [place] [address], [place], [place] [address]    Report Status 02/22/2018 FINAL  Final  C difficile quick scan w PCR reflex     Status: None   Collection Time: 02/17/18  3:08 PM    Result Value Ref Range Status   C Diff antigen NEGATIVE NEGATIVE Final   C Diff toxin NEGATIVE NEGATIVE Final   C Diff interpretation No C. difficile detected.  Final    Comment: Performed at [facility], [place] [address] [place],  [address]    Michel Bickers, [place] for [facility] [phone] pager   [phone] cell 02/25/2018, 1:57 PM

## 2018-02-25 NOTE — Progress Notes (Signed)
Palliative care team available to support this patient and family. At this time she appears to be slowly improving and the family has not been open to meeting with [name]. At this time palliative care will sign off but we are available to re-consult if her condition/goals change to provide support in managing symptoms, decision making or emotional/spiritual support during this serious illness.   Lane Hacker, DO Palliative Medicine [phone]

## 2018-02-25 NOTE — Progress Notes (Signed)
During shift change, pt's spouse came to nurses station and stated that patient had pulled NG tube out. Bubba Hales, RN checked on patient, no signs of distress. Family at bedside. This RN notified of situation, made charge nurse aware. Will replace tube once able.

## 2018-02-25 NOTE — Progress Notes (Signed)
Nutrition Follow-up  DOCUMENTATION CODES:   Not applicable  INTERVENTION:  - Continue TPN/TPN advancement per Pharmacist. - As soon as medically feasible, recommend starting trickle TF to maintain gut integrity and functionality.   NUTRITION DIAGNOSIS:   Increased nutrient needs related to post-op healing as evidenced by estimated needs. -ongoing  GOAL:   Patient will meet greater than or equal to 90% of their needs -unmet at this time  MONITOR:   Weight trends, Labs, Skin, I & O's, Other (Comment)(TF re-start)  REASON FOR ASSESSMENT:   Consult New TPN/TNA  ASSESSMENT:   Patient with PMH significant for COPD< HLD, HTN, mitral valve prolapse, and Stage IV ovarian cancer s/p neoadjuvant chemotherapy . Presents this admission for robotic assisted bilateral salpingo-oophorectomy omentectomy interval debulking.   Significant Events: 8/22:ex lap with infra-gastric omentectomy bilateral salpingo-oophorectomy radical optimal tumor debulking, resection of anterior abdominal wall 8/24:remained somnolent and was confused, noted to have post-op UTI 8/26:remained confused and having chest pain; transferred to SDU for Cardene drip; x-ray showed free air and CT abd concerning for ischemic bowel, bowel pneumatosis, and large amount of pneumoperitoneum; repeat ex lap with washout. 8/27:triple lumen PICC placed in L brachial 8/28:TPN initiated; ILE hold x7 days; received Granix per Oncology. 9/4:trickle TF started via NGT. 9/5:TPN weaned off. 9/6:TF rate advanced to 50 mL/hr(goal rate). 9/9:TF placed on hold d/t concern for microperf 9/10:TF restarted at 50 mL/hr. 9/12:tracheostomy placed. 9/16: vomited and NGT dislodged and subsequently re-advanced; TF placed on hold; trach collar during the day and vent support at night.  9/18: CT abdomen indicates ileus. 9/19: TPN initiated, ILE on hold x7 days per ICU protocol; I&D d/t abdominal wound dehiscence.   Weight trending  back down. Patient with NGT to LIS with no output present at this time. Flow sheet indicates 150 mL output yesterday. TPN was initiated yesterday via triple lumen PICC: Clinimix E 5/15 @ 40 mL/hr. This regimen is providing 682 kcal and 48 grams of protein. Pharmacist's note from yesterday evening states goal rate for TPN: Clinimix E 5/15 @ 75 mL/hr with 20% ILE @ 20 mL/hr x12 hours. This regimen will provide 1758 kcal and 90 grams of protein.     Medications reviewed; 20 mg IV Pepcid BID, 40 mg IV Lasix/day, sliding scale Novolog, 2 g IV Mg sulfate x1 run yesterday and x1 run today, 10 mEq IV KCl x4 runs yesterday and x4 runs today. Labs reviewed; CBGs: 129 and 119 mg/dL today, K: 3.4 mmol/L, Ca: 8.7 mg/dL.   Diet Order:   Diet Order    None      EDUCATION NEEDS:   Education needs have been addressed  Skin:  Skin Assessment: Skin Integrity Issues: Skin Integrity Issues:: Unstageable Unstageable: full thickness to R ear Incisions: abdominal (8/26)  Last BM:  9/19  Height:   Ht Readings from Last 1 Encounters:  01/31/18 5\' 3"  (1.6 m)    Weight:   Wt Readings from Last 1 Encounters:  02/25/18 54.5 kg    Ideal Body Weight:  52.3 kg  BMI:  Body mass index is 21.28 kg/m.  Estimated Nutritional Needs:   Kcal:  1750-1925 (30-33 kcal/kg)  Protein:  82-93 grams (1.4-1.6 grams/kg)  Fluid:  >/= 1.5 L/day     Jarome Matin, MS, RD, LDN, [facility] Inpatient Clinical Dietitian Pager # [phone] After hours/weekend pager # [phone]

## 2018-02-25 NOTE — Consult Note (Addendum)
[facility] Nurse wound consult note Reason for Consult: GYN surgical team following for assessment and plan of care for abd wound. Requested to apply a Vac dressing. Wound type: Midline abd with full thickness post-op wound Measurement: 17X4X2cm Wound bed: 90% beefy red, 10% yellow.  Some darker-colored edges to wound related to previously used Silver Nitrate, according to progress notes Drainage (amount, consistency, odor) Small amt pink drainage, no odor Periwound: intact skin surrounding the wound Dressing procedure/placement/frequency: Pt was medicated for pain prior to the procedure and tolerated with mod amt discomfort.  Applied one piece black foam to 146mm cont suction.   Plan for bedside nurses to change dressing Q M/W/F Please re-consult if further assistance is needed.  Thank-you,  Julien Girt MSN, [place], [place], [place], [place]

## 2018-02-25 NOTE — Progress Notes (Signed)
Physical Therapy Treatment Patient Details Name: Jo Parker MRN: [id number] DOB: 1945/12/10 Today's Date: 02/25/2018    History of Present Illness 72 yo female with history of COPD,MVP,anxiety/depression,ovarian cancer, admitted  01/27/18 with pelvic abcess. s/P  interval debulking surgery with Dr. Skeet Latch on 01/27/18 consisting of an exploratory laparotomy infra-gastric omentectomy bilateral salpingo-oophorectomy radical optimal tumor debulking, resection of anterior abdominal wall, complicated by findings to suggest bowel perforation on CT from 8/26/  required reexploration with VDRF due to aspiration. Tracheostomy on 02/17/18 . Currently on  Trach collar, had trial of PMV with speech today    PT Comments    Pt progressing toward PT goals, able to sit EOB with min assist today; family present for OT/PT session  VSS --> BP 146/80, SpO2=99-100% on T-collar, RR 21-25, HR 101-109 Continue PT POC, may benefit from co-tx with Speech next week, try OOB if possible next session;   Follow Up Recommendations  LTACH     Equipment Recommendations  None recommended by PT(defer to next venue)    Recommendations for Other Services       Precautions / Restrictions Precautions Precautions: Fall;Other (comment) Precaution Comments: NGT, JP drain, Trach collar, Flexiseal. profound weakness from long term vent. Restrictions Weight Bearing Restrictions: No    Mobility  Bed Mobility Overal bed mobility: Needs Assistance Bed Mobility: Rolling;Sidelying to Sit;Sit to Sidelying Rolling: Min assist;+2 for physical assistance;+2 for safety/equipment Sidelying to sit: Mod assist;+2 for physical assistance;+2 for safety/equipment     Sit to sidelying: Mod assist;+2 for physical assistance;+2 for safety/equipment;Max assist General bed mobility comments: step by step multi-modal cues for task sequencing, assist for LEs and trunk  for s/l<>sit  Transfers                 General transfer  comment: NT today d/t fatigue  Ambulation/Gait                 Stairs             Wheelchair Mobility    Modified Rankin (Stroke Patients Only)       Balance Overall balance assessment: Needs assistance Sitting-balance support: Feet unsupported;Single extremity supported;Bilateral upper extremity supported Sitting balance-Leahy Scale: Fair Sitting balance - Comments: poor to fair, pt briefly able to maintain static sitting on EOB with close supervision  Postural control: Posterior lean     Standing balance comment: NT/unable                            Cognition Arousal/Alertness: Awake/alert Behavior During Therapy: WFL for tasks assessed/performed Overall Cognitive Status: Difficult to assess Area of Impairment: Problem solving                   Current Attention Level: Selective   Following Commands: Follows one step commands with increased time;Follows one step commands inconsistently     Problem Solving: Slow processing;Decreased initiation;Requires verbal cues;Requires tactile cues General Comments: patient  follows commands inconsistently, requires hand over hand  at times to follow functional commands/delayed/decr initiation      Exercises General Exercises - Lower Extremity Long Arc Quad: AAROM;AROM;Both;10 reps;Limitations Long Arc Quad Limitations: limited AROM, fatigues easily    General Comments General comments (skin integrity, edema, etc.): pt's  supportive husband and son present for session      Pertinent Vitals/Pain Pain Assessment: Faces Pain Score: 0-No pain Faces Pain Scale: No hurt Pain Intervention(s): Monitored during session    Home Living  Prior Function            PT Goals (current goals can now be found in the care plan section) Acute Rehab PT Goals Patient Stated Goal: per spouse, for patient to get up amd moving PT Goal Formulation: With patient/family Time For  Goal Achievement: 03/08/18 Potential to Achieve Goals: Fair Progress towards PT goals: Progressing toward goals    Frequency    Min 2X/week      PT Plan Current plan remains appropriate    Co-evaluation PT/OT/SLP Co-Evaluation/Treatment: Yes Reason for Co-Treatment: Complexity of the patient's impairments (multi-system involvement);For patient/therapist safety PT goals addressed during session: Mobility/safety with mobility        AM-PAC PT "6 Clicks" Daily Activity  Outcome Measure  Difficulty turning over in bed (including adjusting bedclothes, sheets and blankets)?: Unable Difficulty moving from lying on back to sitting on the side of the bed? : Unable Difficulty sitting down on and standing up from a chair with arms (e.g., wheelchair, bedside commode, etc,.)?: Unable Help needed moving to and from a bed to chair (including a wheelchair)?: Total Help needed walking in hospital room?: Total Help needed climbing 3-5 steps with a railing? : Total 6 Click Score: 6    End of Session Equipment Utilized During Treatment: Other (comment);Oxygen(Tcollar) Activity Tolerance: Patient tolerated treatment well;Patient limited by fatigue Patient left: in bed;with call bell/phone within reach;with bed alarm set;with family/visitor present   PT Visit Diagnosis: Muscle weakness (generalized) (M62.81)     Time: 1434-1500 PT Time Calculation (min) (ACUTE ONLY): 26 min  Charges:  $Therapeutic Activity: 8-22 mins                     Kenyon Ana, PT Pager: [phone] 02/25/2018   [facility] Acute [facility] [phone]    [facility] 02/25/2018, 3:14 PM

## 2018-02-25 NOTE — Progress Notes (Signed)
[place] CONSULT NOTE   Pharmacy Consult for TPN Indication: ileus  Patient Measurements: Height: 5\' 3"  (160 cm) Weight: 121 lb 0.5 oz (54.9 kg) IBW/kg (Calculated) : 52.4 TPN AdjBW (KG): 58.3 Body mass index is 21.44 kg/m.  Body mass index is 21.28 kg/m. Filed Weights   02/23/18 0630 02/24/18 0441 02/25/18 0658  Weight: 133 lb 2.5 oz (60.4 kg) 121 lb 0.5 oz (54.9 kg) 120 lb 2.4 oz (54.5 kg)    HPI: Pt is a 72 year old with ovarian cancer status post bilateral salpingo-oophorectomy and omentectomy with debulking on 8/22. Ex lap on 8/26 and transferred to ICU afterward. Remains critically ill on vent support, now s/p trach placement. Was previously on TPN 8/27-9/5, however has lately had issues tolerating enteral nutrition. TFs held since 9/17 d/t abdominal distention; CT abd shows persistent ileus. NG output remains high. [facility] resuming TPN per pharmacy on 9/19.  Significant events:  - 8/27 Start TPN. Hold lipids for first 7 days while pt in ICU  - 8/30 scheduled Novolog insulin added q4h - 9/2 CT scan of abdomenshowsundrained left pelvic fluid collection, IR consulted - defer drainage for now - 9/4 Lipids started with TPN. Tube feeds (Vital AF 1.2 Cal) initiated at 64mL/hr, order to increase by 22mL/hr every 8 hours to goal rate of 74mL/hr - 9/5 TPN weaned off - 9/19 resume TPN   Insulin requirements past 24 hours: 9 units resistant SSI / 24 hrs  (no hx DM)  Current Nutrition: NPO, TF held  IVF: None  Central access: 02/01/18 TPN start date: 8/27-9/5, restart 9/19  ASSESSMENT                                                                                                          Today:   Glucose:  Within goal (< 180) and range 99-139.   Electrolytes:  K low (has been low despite replacement, on lasix) with goal > 4, CorrCa 10.3, Phos WNL, Mag WNL but below goal > 2.  Lasix 40mg  IV daily  Renal:  SCr low/stable  LFTs:  WNL  (9/20)  TGs:  90 (9/20)  Prealbumin: 3.5 (9/2), 10.6 (9/20)   NUTRITIONAL GOALS                                                                                             RD recs (9/17):  82-93g Protein, 1750-1925 Kcal per day Clinimix 5/15 at a goal rate of 75 ml/hr + 20% fat emulsion 240 ml/day to provide: 90 g of protein and 1758 kCals per day meeting 100% of protein and 100% of kCal needs.  Based on published guidelines, while the patient meets ICU  status the initiation of lipids is delayed for 7 days.  On initial TPN dosing, lipids were held from 8/27 - 9/4.  We will not hold lipids when resuming this TPN dosing.   PLAN                                                                                                                         Now: KCl IV x4 runs  Mag 2g IV x1 dose  At 1800 today:  Advance to Clinimix E 5/15 at 75 ml/hr. (goal rate)  20% fat emulsion at 20 ml/hr x12 hours/day  TPN to contain standard multivitamins and trace element daily.  TPN to contain Pepcid 40mg  IV daily.  IVF per MD, currently none  Continue CBGs and SSI q4h.   TPN lab panels on Mondays & Thursdays.  BMET, Mag, Phos daily for Saturday/Sunday.  Gretta Arab PharmD, BCPS Pager [phone] 02/25/2018 7:40 AM

## 2018-02-25 NOTE — Evaluation (Signed)
Occupational Therapy Evaluation Patient Details Name: Jo Parker MRN: [id number] DOB: [birth year] Today's Date: 02/25/2018    History of Present Illness 72 yo female with history of COPD,MVP,anxiety/depression,ovarian cancer, admitted  01/27/18 with pelvic abcess. s/P  interval debulking surgery with Dr. Skeet Latch on 01/27/18 consisting of an exploratory laparotomy infra-gastric omentectomy bilateral salpingo-oophorectomy radical optimal tumor debulking, resection of anterior abdominal wall, complicated by findings to suggest bowel perforation on CT from 8/26/  required reexploration with VDRF due to aspiration. Tracheostomy on 02/17/18 . Currently on  Trach collar, had trial of PMV with speech today 9/20    Clinical Impression   Pt was admitted for the above. At baseline, she is independent/mod I with adls.  Pt has had a complex, long hospitalization and has been weaning off vent.  Pt was able to tolerate sitting EOB with mostly min guard assist with stable vitals. She needs max to total A for ADLs at this time (+2) for rolling due to limited activity tolerance. Very supportive family present during evaluation. Will follow in acute setting with the goals listed below focusing on ADLs, general strengthening and building activity tolerance    Follow Up Recommendations  LTACH    Equipment Recommendations  (to be further assessed)    Recommendations for Other Services       Precautions / Restrictions Precautions Precautions: Fall;Other (comment) Precaution Comments: NGT, JP drain, Trach collar, Flexiseal. profound weakness from long term vent. Restrictions Weight Bearing Restrictions: No      Mobility Bed Mobility Overal bed mobility: Needs Assistance Bed Mobility: Rolling;Sidelying to Sit;Sit to Sidelying Rolling: Min assist;+2 for physical assistance;+2 for safety/equipment Sidelying to sit: Mod assist;+2 for physical assistance;+2 for safety/equipment     Sit to sidelying: Mod  assist;+2 for physical assistance;+2 for safety/equipment;Max assist General bed mobility comments: step by step multi-modal cues for task sequencing, assist for LEs and trunk  for s/l<>sit  Transfers                 General transfer comment: NT today d/t fatigue    Balance Overall balance assessment: Needs assistance Sitting-balance support: Feet unsupported;Single extremity supported;Bilateral upper extremity supported Sitting balance-Leahy Scale: Fair Sitting balance - Comments: poor to fair, pt briefly able to maintain static sitting on EOB with close supervision  Postural control: Posterior lean     Standing balance comment: NT/unable                           ADL either performed or assessed with clinical judgement   ADL Overall ADL's : Needs assistance/impaired Eating/Feeding: NPO   Grooming: Maximal assistance   Upper Body Bathing: Maximal assistance   Lower Body Bathing: Total assistance;+2 for physical assistance; safety; bed level   Upper Body Dressing : Maximal assistance   Lower Body Dressing: Total assistance +2 for physical assistance;safety; bed level                 General ADL Comments: pt sat EOB with min guard for balance.  Fatiques quickly. Decreased endurance for adls. Pt with NG tube     Vision         Perception     Praxis      Pertinent Vitals/Pain Pain Assessment: Faces Pain Score: 0-No pain Faces Pain Scale: No hurt Pain Intervention(s): Monitored during session     Hand Dominance     Extremity/Trunk Assessment Upper Extremity Assessment Upper Extremity Assessment: Generalized weakness RUE Deficits / Details:  able to lift bil UEs to 90       Cervical / Trunk Assessment Cervical / Trunk Exceptions: neck tight toward midline; tends to hold more to R   Communication Communication Communication: Tracheostomy   Cognition Arousal/Alertness: Awake/alert Behavior During Therapy: WFL for tasks  assessed/performed Overall Cognitive Status: Difficult to assess Area of Impairment: Problem solving                   Current Attention Level: Selective   Following Commands: Follows one step commands with increased time;Follows one step commands inconsistently     Problem Solving: Slow processing;Decreased initiation;Requires verbal cues;Requires tactile cues General Comments: patient  follows commands inconsistently, requires hand over hand  at times to follow functional commands/delayed/decr initiation   General Comments  pt's  supportive husband and son present for session.  VSS Pt with trach collar:  RR up to 25; sats in upper 90s.  BP WFLs, no dizziness    Exercises General Exercises - Lower Extremity Long Arc Quad: AAROM;AROM;Both;10 reps;Limitations Long Arc Quad Limitations: limited AROM, fatigues easily   Shoulder Instructions      Home Living Family/patient expects to be discharged to:: [facility]) Living Arrangements: Spouse/significant other;Children                               Additional Comments: pt/spouse live in [place], pt staying with son while doing chemo here in [place]      Prior Functioning/Environment Level of Independence: Independent        Comments: independent walking short distances, ADLs independent but slow and labored        OT Problem List: Decreased strength;Decreased activity tolerance;Impaired balance (sitting and/or standing);Decreased cognition;Decreased knowledge of use of DME or AE;Cardiopulmonary status limiting activity      OT Treatment/Interventions: Self-care/ADL training;Energy conservation;DME and/or AE instruction;Balance training;Patient/family education;Cognitive remediation/compensation;Therapeutic activities;Therapeutic exercise    OT Goals(Current goals can be found in the care plan section) Acute Rehab OT Goals Patient Stated Goal: per spouse, for patient to get up amd moving OT Goal  Formulation: With patient/family Time For Goal Achievement: 03/11/18 Potential to Achieve Goals: Good ADL Goals Pt Will Perform Eating: with set-up Pt Will Perform Grooming: with set-up;sitting Pt Will Perform Upper Body Bathing: with min assist;sitting Pt Will Perform Lower Body Bathing: with mod assist;sit to/from stand Pt Will Perform Upper Body Dressing: with min assist;sitting Pt Will Perform Lower Body Dressing: with max assist;sit to/from stand Pt Will Transfer to Toilet: with min assist;stand pivot transfer;bedside commode Additional ADL Goal #1: pt will tolerate 20 minutes light adl/strengthening activity with 4 rest breaks  OT Frequency: Min 2X/week   Barriers to D/C:            Co-evaluation PT/OT/SLP Co-Evaluation/Treatment: Yes Reason for Co-Treatment: Complexity of the patient's impairments (multi-system involvement);For patient/therapist safety PT goals addressed during session: Mobility/safety with mobility OT goals addressed during session: Strengthening/ROM      AM-PAC PT "6 Clicks" Daily Activity     Outcome Measure Help from another person eating meals?: A Lot Help from another person taking care of personal grooming?: A Lot Help from another person toileting, which includes using toliet, bedpan, or urinal?: Total Help from another person bathing (including washing, rinsing, drying)?: A Lot Help from another person to put on and taking off regular upper body clothing?: Total Help from another person to put on and taking off regular lower body clothing?: A Lot  6 Click Score: 10   End of Session    Activity Tolerance: Patient limited by fatigue Patient left: in bed;with call bell/phone within reach;with bed alarm set  OT Visit Diagnosis: Muscle weakness (generalized) (M62.81)                Time: 1434-1500 OT Time Calculation (min): 26 min Charges:  OT General Charges $OT Visit: 1 Visit OT Evaluation $OT Eval Moderate Complexity: [place], OTR/L Acute Rehabilitation Services [phone] [facility] pager [phone] office 02/25/2018  Kenneth 02/25/2018, 3:41 PM

## 2018-02-25 NOTE — Progress Notes (Signed)
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  She is completed 3 cycles of chemotherapy and is now status post bilateral salpingo-oophorectomy and omentectomy with debulking on 8/22.   8/26 transferred to ICU w/ concern for perforation w/ free air noted.  Imipenem started. Went for exploratory laparotomy with washout.  No significant contamination identified, presumed spontaneously closed microperf. Returned with abdominal drains postoperative chest x-ray showing evidence of aspiration pneumonia; mixed respiratory and metabolic acidosis & in septic shock. Required intubation.   8/27 GI medicine consulted, did not feel she had overt GIB.  Rec's prophylaxis with PPI.  Signed off.  Weaning pressors. Hgb dropped from 8.8 to 7.4; got 2 units of blood,  febrile, added. vancomycin.  Neupogen given for neutropenia.   8/28-9/1 Shock resolved, TPN started. Fevers. Delirium barrier to weaning.  ABX for fever  9/02-9/6 Delirium. Sedation difficult, diuresing, remains on abx. spiking temps, loculated fluid collection identified in left pelvis. Not amedable to drainage.  ID consulted. Started seroquel. 9/4. Upper 2 drains removed. Staples removed.from incision site.  9/4-9/9 ID consulted by family.  Fevers ongoing, CT abd with improved fluid collections.  Failing PSV wean.  No pressors.  No sedation.   Husband deferring decisions about care to son (newly married).   9/10  Sister at bedside - says she is not in favor of trach but says is the decision of son/dpoa. Son met with Dr Alvy Bimler for goals of care - current status precludes chemo but goal is trach and full code and full medical care and later chemo when ECOG improves.  Low grade fever persists - ID following. More awake today.  No Perf leak on CT abd and restarted TF  9/12-9/14  s/p Trach - Dr Nelda Marseille. Levophed post trach.  Fevers. 1 unit PRBC,   9/14 - [facility] consulted for medical management   9/18 - CT abdomen with ileus   Subjective: Patient seen and examined, she looks more alert today, feels comfortable. BP still high. NGT minimal output.   Assessment & Plan:   Principal Problem:   Pelvic abscess in female Active Problems:   Primary peritoneal carcinomatosis ([place])   Severe protein-calorie malnutrition ([place])   Severe sepsis ([name])   COPD (chronic obstructive pulmonary disease) ([name])   HLD (hyperlipidemia)   Absolute anemia   White coat syndrome with hypertension   Ovarian cancer ([place])   AKI (acute kidney injury) ([place])   Acute metabolic encephalopathy   Acute respiratory distress   Low urine output   Somnolence   At risk for fluid volume overload   Free intraperitoneal air   Acute on chronic respiratory failure ([name])   Pressure injury of skin   Status post bilateral salpingo-oophorectomy   Peritonitis ([place])   HCAP (healthcare-associated pneumonia)   Postoperative wound infection   Aspiration pneumonia ([name])   Wound dehiscence, surgical, initial encounter  Acute on chronic hypoxemic respiratory failure status post trach (9/12) - stable  In setting of aspiration pneumonia/ARDS Right-sided pleural effusion, per PCCM no significant pocket to drain on bedside ultrasound.  Patient tolerating ATC well during the day. Vent only at night. Continue pulmonary hygiene as able. Continue Lasix. Continue trach management per pulmonary team.  Status post exploratory laparotomy for free air on 8/26; following bilateral salpingo-oophorectomy, omentectomy and debulking surgery on 8/22. S/p I&D  from wound dehiscence 9/19   Continue management and wound care per OB/GYN/ONC  Abdominal pain/Ileus  CT abdomen shows persistent ileus. Keep patient n.p.o. Keep NG tube to low suction. Continue to replete potassium to  keep above 4 and magnesium above 2. On TPN   Severe sepsis pelvic abscess, peritonitis - sepsis physiology resolved Initially with peritonitis (microperforation) subsequently complicated by pneumonia. Being followed by ID.  Defer antibiotic therapy per ID and primary team.  HTN  BP remains elevated despite schedule IV metoprolol. Will add vasotec 0.625 mg IV q6 hrs, continue hydralazine and labetalol PRN. Echo LVEF 65 to 70% with moderate LVH, grade 1 diastolic dysfunction.  Patient also on daily IV Lasix.  Severe protein calorie malnutrition Holding tube feeding due to abdominal distention/pain, likely malabsorption  Continue TPN per pharmacy, monitor electrolytes   Postoperative anemia Status post 1 unit PRBCs, Hgb remains stable  Transfuse if hemoglobin less than 7.  Hypokalemia/hypomagnesemia Replete as needed, keep K > 4 and mag > 2  Acute metabolic encephalopathy - resolved In setting of critical illness and sedation. Celexa and Seroquel d/ced as patient not able to tolerate p.o. meds at this time.  If agitation or delirium will try IV Haldol.  Deconditioning  Patient will benefit from LTAC  Continue PT per ICU protocol   Code Status: Full Code  Family Communication: Husband at bedside    Procedures:     Antimicrobials: Anti-infectives (From admission, onward)   Start     Dose/Rate Route Frequency Ordered Stop   02/22/18 1400  vancomycin (VANCOCIN) IVPB 750 mg/150 ml premix  Status:  Discontinued     750 mg 150 mL/hr over 60 Minutes Intravenous Every 24 hours 02/22/18 1335 02/22/18 1337   02/22/18 1400  vancomycin (VANCOCIN) IVPB 1000 mg/200 mL premix     1,000 mg 200 mL/hr over 60 Minutes Intravenous Every 24 hours 02/22/18 1337     02/22/18 1200  vancomycin (VANCOCIN) IVPB 750 mg/150 ml premix  Status:  Discontinued     750 mg 150 mL/hr over 60 Minutes Intravenous Every 24 hours 02/22/18 0903 02/22/18 1243   02/17/18 1200  vancomycin (VANCOCIN) IVPB 750 mg/150  ml premix  Status:  Discontinued     750 mg 150 mL/hr over 60 Minutes Intravenous Every 24 hours 02/16/18 1518 02/22/18 0521   02/16/18 1800  Ampicillin-Sulbactam (UNASYN) 3 g in sodium chloride 0.9 % 100 mL IVPB     3 g 200 mL/hr over 30 Minutes Intravenous Every 6 hours 02/16/18 1619     02/07/18 1200  vancomycin (VANCOCIN) IVPB 1000 mg/200 mL premix  Status:  Discontinued     1,000 mg 200 mL/hr over 60 Minutes Intravenous Every 24 hours 02/06/18 1315 02/16/18 1518   02/06/18 1015  vancomycin (VANCOCIN) IVPB 1000 mg/200 mL premix     1,000 mg 200 mL/hr over 60 Minutes Intravenous STAT 02/06/18 1012 02/06/18 1211   02/02/18 1000  anidulafungin (ERAXIS) 100 mg in sodium chloride 0.9 % 100 mL IVPB     100 mg 78 mL/hr over 100 Minutes Intravenous Every 24 hours 02/01/18 0916     02/02/18 1000  vancomycin (VANCOCIN) IVPB 1000 mg/200 mL premix  Status:  Discontinued     1,000 mg 200 mL/hr over 60 Minutes Intravenous Every 24 hours 02/01/18 1039 02/04/18 0852   02/01/18 1000  anidulafungin (ERAXIS) 200 mg in sodium chloride 0.9 % 200 mL IVPB     200 mg 78 mL/hr over 200 Minutes Intravenous  Once  02/01/18 0916 02/01/18 1602   02/01/18 0830  vancomycin (VANCOCIN) 1,250 mg in sodium chloride 0.9 % 250 mL IVPB     1,250 mg 166.7 mL/hr over 90 Minutes Intravenous STAT 02/01/18 0822 02/01/18 1035   01/31/18 1800  meropenem (MERREM) 1 g in sodium chloride 0.9 % 100 mL IVPB  Status:  Discontinued     1 g 200 mL/hr over 30 Minutes Intravenous Every 8 hours 01/31/18 1012 02/16/18 1619   01/31/18 0900  meropenem (MERREM) 1 g in sodium chloride 0.9 % 100 mL IVPB     1 g 200 mL/hr over 30 Minutes Intravenous STAT 01/31/18 0852 01/31/18 1005   01/27/18 0600  cefOXitin (MEFOXIN) 2 g in sodium chloride 0.9 % 100 mL IVPB     2 g 200 mL/hr over 30 Minutes Intravenous On call to O.R. 01/27/18 0533 01/27/18 0753       Objective: Vitals:   02/25/18 0658 02/25/18 0700 02/25/18 0800 02/25/18 0840  BP:   (!) 178/96 (!) 213/97   Pulse:  85 90   Resp:  19 20   Temp:   99 F (37.2 C)   TempSrc:   Axillary   SpO2:  100% 100% 100%  Weight: 54.5 kg     Height:        Intake/Output Summary (Last 24 hours) at 02/25/2018 0857 Last data filed at 02/25/2018 0700 Gross per 24 hour  Intake 1968.31 ml  Output 2550 ml  Net -581.69 ml   Filed Weights   02/23/18 0630 02/24/18 0441 02/25/18 0658  Weight: 60.4 kg 54.9 kg 54.5 kg    Examination:  General: NAD  HEENT: NG tube with no output, Trach on vent full support   Cardiovascular: S1S2 RRR  Respiratory: Decrease BS, diffuse rhonchi  Abdominal: Abdomen covered on dressing, clean JP drain in place  Extremities: No LE edema  Skin: warm and dry   Data Reviewed: I have personally reviewed following labs and imaging studies  CBC: Recent Labs  Lab 02/21/18 0323 02/22/18 0326 02/23/18 0645 02/24/18 0330 02/25/18 0455  WBC 8.2 7.7 11.0* 9.0 9.3  NEUTROABS 6.2 5.0 8.0* 6.3 6.8  HGB 8.5* 8.6* 10.0* 8.9* 8.9*  HCT 27.0* 27.7* 31.5* 28.1* 29.3*  MCV 80.8 81.7 80.8 81.2 82.3  PLT 346 372 379 359 144   Basic Metabolic Panel: Recent Labs  Lab 02/21/18 0323 02/22/18 0326 02/23/18 0645 02/24/18 0330 02/24/18 1140 02/25/18 0455  NA 141 147* 145  --  148* 145  K 2.8* 3.2* 3.2*  --  2.4* 3.4*  CL 96* 100 99  --  115* 105  CO2 37* 36* 35*  --  27 32  GLUCOSE 151* 103* 119*  --  89 150*  BUN '11 15 14  '$ --  11 14  CREATININE 0.54 0.47 0.54  --  0.39* 0.48  CALCIUM 7.9* 8.6* 8.7*  --  6.6* 8.7*  MG 1.7 2.0 1.6* 1.9  --  1.9  PHOS 3.5 4.1 4.0 3.8  --  3.6   GFR: Estimated Creatinine Clearance: 52.6 mL/min (by C-G formula based on SCr of 0.48 mg/dL). Liver Function Tests: Recent Labs  Lab 02/20/18 0416 02/21/18 0323 02/22/18 0326 02/23/18 0645 02/25/18 0455  AST '27 27 29 26 19  '$ ALT '23 21 23 24 18  '$ ALKPHOS 103 89 84 91 79  BILITOT 0.7 0.3 0.6 0.6 0.8  PROT 6.5 6.5 6.8 7.6 7.5  ALBUMIN 1.7* 1.6* 1.7* 1.9* 2.0*   No results for  input(s): LIPASE, AMYLASE in the last 168 hours. No results for input(s): AMMONIA in the last 168 hours. Coagulation Profile: No results for input(s): INR, PROTIME in the last 168 hours. Cardiac Enzymes: No results for input(s): CKTOTAL, CKMB, CKMBINDEX, TROPONINI in the last 168 hours. BNP (last 3 results) No results for input(s): PROBNP in the last 8760 hours. HbA1C: No results for input(s): HGBA1C in the last 72 hours. CBG: Recent Labs  Lab 02/24/18 1659 02/24/18 1954 02/24/18 2319 02/25/18 0330 02/25/18 0809  GLUCAP 114* 139* 121* 129* 119*   Lipid Profile: Recent Labs    02/25/18 0455  CHOL 119  HDL 28*  LDLCALC 73  TRIG 90  94  CHOLHDL 4.3   Thyroid Function Tests: No results for input(s): TSH, T4TOTAL, FREET4, T3FREE, THYROIDAB in the last 72 hours. Anemia Panel: No results for input(s): VITAMINB12, FOLATE, FERRITIN, TIBC, IRON, RETICCTPCT in the last 72 hours. Sepsis Labs: No results for input(s): PROCALCITON, LATICACIDVEN in the last 168 hours.  Recent Results (from the past 240 hour(s))  Culture, Urine     Status: None   Collection Time: 02/17/18 12:45 PM  Result Value Ref Range Status   Specimen Description   Final    URINE, CATHETERIZED Performed at [facility] [address] [place], [place] [address]    Special Requests   Final    Immunocompromised Performed at [facility], [place] [address], [place], [place] [address]    Culture   Final    NO GROWTH Performed at [facility], [place] [address], [place], [place] [address]    Report Status 02/18/2018 FINAL  Final  Culture, blood (routine x 2)     Status: None   Collection Time: 02/17/18  1:12 PM  Result Value Ref Range Status   Specimen Description   Final    BLOOD RIGHT ANTECUBITAL Performed at [facility] [address], [place], [place] [address]    Special Requests   Final    BOTTLES DRAWN AEROBIC ONLY Blood Culture adequate  volume Performed at [facility] [address], [place], [place] [address]    Culture   Final    NO GROWTH 5 DAYS Performed at [facility], [place] [address] [place], [place] [address]    Report Status 02/22/2018 FINAL  Final  Culture, blood (routine x 2)     Status: None   Collection Time: 02/17/18  1:14 PM  Result Value Ref Range Status   Specimen Description   Final    BLOOD RIGHT ANTECUBITAL Performed at [facility] [address] [place], [place] [address]    Special Requests   Final    BOTTLES DRAWN AEROBIC ONLY Blood Culture adequate volume Performed at [facility] [address], [place], [place] [address]    Culture   Final    NO GROWTH 5 DAYS Performed at [facility], [place] [address], [place],  [address]    Report Status 02/22/2018 FINAL  Final  C difficile quick scan w PCR reflex     Status: None   Collection Time: 02/17/18  3:08 PM  Result Value Ref Range Status   C Diff antigen NEGATIVE NEGATIVE Final   C Diff toxin NEGATIVE NEGATIVE Final   C Diff interpretation No C. difficile detected.  Final    Comment: Performed at [facility], [place] [address], [place],  [address]  Radiology Studies: Dg Chest 1 View  Result Date: 02/23/2018 CLINICAL DATA:  Nasogastric tube placement. EXAM: CHEST  1 VIEW COMPARISON:  Chest radiograph February 22, 2018 FINDINGS: Nasogastric tube looped in proximal stomach. Stable appearance of tracheostomy tube, LEFT PICC, RIGHT single-lumen chest Port-A-Cath, pacemaker device versus loop monitor. Cardiomediastinal silhouette is normal. Calcified aortic arch. RIGHT upper lobe alveolar airspace opacity. Diffuse interstitial prominence with patchy RIGHT lower lobe airspace opacity. No pneumothorax. Soft tissue planes and included osseous structures are unchanged. IMPRESSION: Nasogastric tube tip projects in proximal stomach, stable  appearance of remaining life support lines. RIGHT upper lobe consolidation with RIGHT lower lobe atelectasis or early pneumonia. Similar interstitial prominence. Aortic Atherosclerosis (ICD10-I70.0). Electronically Signed   By: Elon Alas M.D.   On: 02/23/2018 15:29   Ct Abdomen Pelvis W Contrast  Result Date: 02/23/2018 CLINICAL DATA:  Increased abdominal distension with vomiting. Stage IV ovarian cancer status post debulking on 08/22 in subsequent re-exploration for concern of bowel perforation. EXAM: CT ABDOMEN AND PELVIS WITH CONTRAST TECHNIQUE: Multidetector CT imaging of the abdomen and pelvis was performed using the standard protocol following bolus administration of intravenous contrast. CONTRAST:  157m OMNIPAQUE IOHEXOL 300 MG/ML  SOLN COMPARISON:  02/11/2018.  Pelvis CT 02/14/2018. FINDINGS: Lower chest: Small bilateral pleural effusions with bibasilar dependent collapse/consolidation Hepatobiliary: No focal abnormality within the liver parenchyma. Gallbladder is distended and appears to track laterally up over the lateral margin of the liver. No intrahepatic or extrahepatic biliary dilation. Pancreas: No focal mass lesion. No dilatation of the main duct. No intraparenchymal cyst. No peripancreatic edema. Spleen: No splenomegaly. No focal mass lesion. Adrenals/Urinary Tract: No adrenal nodule or mass. Right kidney unremarkable. Stable central sinus cyst lower pole left kidney. No evidence for hydroureter. Bladder is distended and gas in the bladder lumen suggests recent instrumentation. Stomach/Bowel: Stomach is nondistended with NG tube in situ. Duodenum is normally positioned as is the ligament of Treitz. Duodenal diverticulum evident. Diffuse fluid-filled distended small bowel evident with proximal loops measuring up to 3.4 cm diameter. Colon is diffusely gas filled and distended with non dependent segments (distal transverse) measuring up to 6 cm diameter. There is no evidence for small  bowel or colonic wall thickening although some mild hyperenhancement is noted in the tip of the cecum. Rectal tube visualized in situ. Vascular/Lymphatic: There is abdominal aortic atherosclerosis without aneurysm. There is no gastrohepatic or hepatoduodenal ligament lymphadenopathy. No intraperitoneal or retroperitoneal lymphadenopathy. No pelvic sidewall lymphadenopathy. Reproductive: Uterus surgically absent.  There is no adnexal mass. Other: Small volume ascites is seen around the liver and spleen. There is edema diffusely in the mesentery, scattered areas of interloop mesenteric fluid, and trace fluid evident in each paracolic gutter. Small volume free fluid seen in the pelvis. Surgical drain noted looped in the left lower quadrant. 7.7 x 1.9 cm rim enhancing fluid collection tracks along the left pelvic sidewall. Another smaller rim enhancing fluid collection is seen medial to this on 67/2. Non dependent portion of this collection demonstrates a small gas bubble (69/2). Comparing to 02/11/2018 and 02/14/2018, the attenuation in this collection has decreased as has the volume of gas. Musculoskeletal: Degenerative changes noted in the left hip. No worrisome lytic or sclerotic osseous abnormality. Open midline wound evident. IMPRESSION: 1. Diffuse distention of large and small bowel. There are some small bowel loops in the right abdomen that are decompressed, but non dependent segments of colon are distended with gas. CT imaging features favor ileus although small bowel obstruction is possible.  2. Evolution of the left pelvic multifocal rim enhancing fluid collection. Gas within this collection has decreased in the interval. Material in this collection on the 02/11/2018 exam was higher in attenuation. This may represent an evolving hematoma although abscess remains a consideration. 3. Similar intraperitoneal free fluid with interloop mesenteric fluid and mesenteric edema. 4. Open midline wound. 5. Small bilateral  pleural effusions with bibasilar dependent collapse/consolidation in the lower lobes. Electronically Signed   By: Misty Stanley M.D.   On: 02/23/2018 13:46     Scheduled Meds: . chlorhexidine gluconate (MEDLINE KIT)  15 mL Mouth Rinse BID  . enalaprilat  0.625 mg Intravenous Q6H  . enoxaparin (LOVENOX) injection  40 mg Subcutaneous Q24H  . furosemide  40 mg Intravenous Daily  . insulin aspart  0-20 Units Subcutaneous Q4H  . ipratropium-albuterol  3 mL Nebulization TID  . mouth rinse  15 mL Mouth Rinse 10 times per day  . metoprolol tartrate  5 mg Intravenous Q6H  . sodium chloride flush  10-40 mL Intracatheter Q12H   Continuous Infusions: . Marland KitchenTPN (CLINIMIX-E) Adult 40 mL/hr at 02/25/18 0700  . sodium chloride 10 mL/hr at 02/21/18 1626  . sodium chloride Stopped (02/23/18 1057)  . ampicillin-sulbactam (UNASYN) IV Stopped (02/25/18 0655)  . anidulafungin Stopped (02/24/18 1050)  . famotidine (PEPCID) IV Stopped (02/24/18 2247)  . magnesium sulfate 1 - 4 g bolus IVPB 2 g (02/25/18 0831)  . potassium chloride 10 mEq (02/25/18 0832)  . vancomycin Stopped (02/24/18 1519)     LOS: 29 days    Time spent: Total of 25 minutes spent with pt, greater than 50% of which was spent in discussion of  treatment, counseling and coordination of care   Chipper Oman, MD Pager: Text Page via [email or web address]   If 7PM-7AM, please contact night-coverage [email or web address] 02/25/2018, 8:57 AM   Note - This record has been created using [employer]. Chart creation errors have been sought, but may not always have been located. Such creation errors do not reflect on the standard of medical care.

## 2018-02-25 NOTE — Progress Notes (Signed)
Pharmacy Antibiotic Note  Jo Parker is a 72 y.o. female admitted on 01/27/2018 with hx of ovarian cancer for bilateral salpingo-oophorectomy and omentectomy with debulking on 8/22.  She developed suspected intra-abdominal infection.  CT abdomen results worrisome for ischemic bowel with portal venous gas, bowel pneumatosis, and large amount of pneumoperitoneum without clear source of perforation.  Pharmacy has been consulted for meropenem dosing on 8/26 and added vancomycin on 8/27 for pneumonia. Vancomycin d/c'ed 8/30, but resumed 9/1 for new fevers. Anidulafungin added for empiric coverage given concern for intra-abdominal infection, ongoing fever. Meropenem>>Unasyn on 9/11.   Today, 02/25/18:  Total abx Day #26 / ID plans on 28 days of total abx tx  Required I&D d/t abdominal wound dehiscence on 9/19 with wound vac placement on 9/20.  Afebrile, WBC 9.3, SCr low/stable  Plan per ID:  Continue Unasyn 3 gm IV q6h  Continue Anidulafungin 100mg  IV q24h  Continue Vancomycin 1000 mg IV q24  Follow up renal fxn, culture results, clinical course, ID recommendations  Vancomycin levels as needed, at least weekly  Height: 5\' 3"  (160 cm) Weight: 120 lb 2.4 oz (54.5 kg) IBW/kg (Calculated) : 52.4  Temp (24hrs), Avg:99 F (37.2 C), Min:98.7 F (37.1 C), Max:99.5 F (37.5 C)  Recent Labs  Lab 02/21/18 0323 02/21/18 1403 02/22/18 0326 02/22/18 1123 02/23/18 0645 02/24/18 0330 02/24/18 1140 02/25/18 0455  WBC 8.2  --  7.7  --  11.0* 9.0  --  9.3  CREATININE 0.54  --  0.47  --  0.54  --  0.39* 0.48  VANCOTROUGH  --   --   --  7*  --   --   --   --   VANCOPEAK  --  140*  --   --   --   --   --   --     Estimated Creatinine Clearance: 52.6 mL/min (by C-G formula based on SCr of 0.48 mg/dL).    Allergies  Allergen Reactions  . Oxycodone     confusion   Antimicrobials this admission: 8/26 Meropenem >> 9/11 8/27 Vancomycin >>8/30, resume 9/1 >>  8/27 Anidulafungin >>  9/11  unasyn >>  Dose adjustments this admission: 9/4 Vanc peak @ 1440 = 36 mcg/mL, 9/5 Vanc trough @ 1200 = 9 mcg/mL --> AUC 485, no change 9/11 Vanc dose at 1200, Vanc trough at 1122 = 12, Vanc trough at 1400 = 49 - calculated AUC of 652 using 1g q24 - would decrease to 750mg  q24 for AUC of 489 (Pk 39 and Tr 8.6) 9/16 Vancomycin 750 mg dose at 1200, Peak of 140 at 1400 (suspect sample was contaminated), Trough of 7 at 11:30 on 9/17.  Unable to calculate accurate AUC with contaminated peak level.  Due to low trough of 7, increase back to Vancomycin 1g IV q24h.  Microbiology results: 8/24 UCx: NGF 8/25 MRSA PCR: negative 8/26 BCx: NGF 8/27 Trach asp: few candida albicans 8/31 Intraperitoneal drain#3: NGF 9/1 BCx (x1): NGF 9/1 Trach asp: few candida albicans, few candida glabrata  9/1 UCx: NGF 9/5 abdomen: culture reincubated for better growth - few lactobacillus 9/8 JP drain: Rare Ecoli (pansensitive) and moderate lactobacillus species (not sent testing) 9/12 C diff: Ag neg, toxin neg 9/12 BCx2: ngtd 9/12 Ucx: NGF 9/12 sputum: ordered  Thank you for allowing pharmacy to be a part of this patient's care.  Gretta Arab PharmD, BCPS Pager [phone] 02/25/2018 1:13 PM

## 2018-02-25 NOTE — Progress Notes (Signed)
Patients NGT externally measuring 97cm previous was 64cm.  RN advanced NGT and secured.  Order for Abdominal xray placed, per MD.  Position confirmed with xray, lower intermittent suction continued.

## 2018-02-25 NOTE — Care Management Note (Signed)
Case Management Note  Patient Details  Name: Jo Parker MRN: [id number] Date of Birth: 02-Aug-1945  Subjective/Objective:                  Continued on pm vent and tc during day at 30% Iv abx and eraxis, iv tpn, Action/Plan: Insurance denied ltach/next step? Following for progression of care and condition. Following for cm needs.  Expected Discharge Date:  01/29/18               Expected Discharge Plan:  Home/Self Care  In-House Referral:     Discharge planning Services  CM Consult  Post Acute Care Choice:    Choice offered to:     DME Arranged:    DME Agency:     HH Arranged:    HH Agency:     Status of Service:  In process, will continue to follow  If discussed at Long Length of Stay Meetings, dates discussed:    Additional Comments:  Leeroy Cha, RN 02/25/2018, 10:04 AM

## 2018-02-25 NOTE — Progress Notes (Addendum)
Gynecologic Oncology Progress Note  72 year old female diagnosed with Stage IV ovarian cancer who received four cycles of neoadjuvant chemotherapy with carboplatin and taxol with fourth cycle completed on 12/20/17 .  She underwent interval debulking surgery with Dr. Skeet Latch on 01/27/18 consisting of an exploratory laparotomy infra-gastric omentectomy bilateral salpingo-oophorectomy radical optimal tumor debulking, resection of anterior abdominal wall.  Her post-operative course was complicated by findings to suggest bowel perforation on CT from 01/31/18 post-op with no site visualized on re-operation. It was felt that aspiration most likely occurred during intubation with the re-operation.          Subjective: Patient alert. Attempts to answer questions.  Nodding that her abdomen feels the same and having moderate pain in her abdomen.  Husband at the bedside.      Objective: Vital signs in last 24 hours: Temp:  [98.7 F (37.1 C)-99.5 F (37.5 C)] 99 F (37.2 C) (09/20 0800) Pulse Rate:  [85-114] 90 (09/20 0800) Resp:  [15-27] 20 (09/20 0800) BP: (122-213)/(65-117) 213/97 (09/20 0800) SpO2:  [99 %-100 %] 100 % (09/20 0840) FiO2 (%):  [28 %-30 %] 30 % (09/20 0840) Weight:  [120 lb 2.4 oz (54.5 kg)] 120 lb 2.4 oz (54.5 kg) (09/20 0658) Last BM Date: 02/24/18  Intake/Output from previous day: 09/19 0701 - 09/20 0700 In: 1968.3 [I.V.:509.6; IV Piggyback:1458.7] Out: 2550 [Urine:2450; Stool:100]  Physical Examination: General: alert, appears comfortable, nodding head when asked questions Cardio: Tachycardia intermittently.    Resp: Coarse breath sounds.  Rhonchi present.  Trach in place. GI: Abdomen tight, distended (similar to 9/19 assessment) with active bowel sounds.  Tympanic on percussion. NG suction off due to pending xray for placement. Flexiseal in place with light brown liquid stool in bag (100 cc output on 9/19). Dressing intact.  Plan for wound VAC placement today per WO RN. LLQ JP  with continued tan output.   Extremities: generalized edema, non-pitting.   Labs: WBC/Hgb/Hct/Plts:  9.3/8.9/29.3/347 (09/20 0455) BUN/Cr/glu/ALT/AST/amyl/lip:  14/0.48/--/18/19/--/-- (09/20 0455)  Assessment: 72 y.o. s/p Procedure(s): Interval debulking surgery with Dr. Skeet Latch on 01/27/18 consisting of an exploratory laparotomy infra-gastric omentectomy bilateral salpingo-oophorectomy radical optimal tumor debulking, resection of anterior abdominal wall.  Reoperation of EXPLORATORY LAPAROTOMY, ABDOMINAL WASHOUT on 01/31/18:   Heme:  Post-operative anemia: Hgb 8.9 and Hct 29.3 this am. Last transfusion 02/19/18.  Anemia related to acute critical illness, no evidence of active bleeding.   ID: Fevers improving.  Highest temp of 99 on 02/24/18. WBC 9.3 this am.  For abd xray this am for NG tube position: IMPRESSION: Nasogastric tube appears to have been retracted slightly compared to prior exam, with distal tip now seen in expected position of proximal stomach with side hole in expected position of gastroesophageal junction. Stable diffuse bowel dilatation is noted most consistent with ileus.  CT AP from 9/18: 1. Diffuse distention of large and small bowel. There are some small bowel loops in the right abdomen that are decompressed, but non dependent segments of colon are distended with gas. CT imaging features favor ileus although small bowel obstruction is possible. 2. Evolution of the left pelvic multifocal rim enhancing fluid collection. Gas within this collection has decreased in the interval. Material in this collection on the 02/11/2018 exam was higher in attenuation. This may represent an evolving hematoma although abscess remains a consideration. 3. Similar intraperitoneal free fluid with interloop mesenteric fluid and mesenteric edema. 4. Open midline wound. 5. Small bilateral pleural effusions with bibasilar dependent collapse/consolidation in the lower  lobes.  Urine for culture with no  growth and stool for c diff negative from 02/17/18. Blood cultures drawn 02/17/18 with no growth.  Presumed sepsis initially from gastrointestinal source but unable to identify the perforation after exp lap on 01/31/18. Contribution now from pneumonia and possibly undrained left pelvic fluid collection seen on CT scan 02/07/18; however the pelvic collection is improving. 02/13/18 enteric versus old hematoma appearing contents in JP. Cultures showing FEW WBC PRESENT, PREDOMINANTLY PMN MODERATE GRAM NEGATIVE RODS FEW GRAM POSITIVE RODS.   Repeat CT 02/14/18: Similar to slight decrease in size of ill-defined complex fluid and gas in the lateral left hemipelvis. 2. No evidence of contrast communication between the thickened sigmoid and this pelvic sidewall process. 3. Foley catheter and air within the urinary bladder, which is not decompressed. Correlate with catheter function.   Granix received 02/01/18 per Dr. Alvy Bimler.  Vancomycin stopped on 02/04/18 and then fevers began again. Restarted Vancomycin on 02/06/18. Anidulafungin initiated 02/01/18.  Meropenum IV started 01/31/18 and discontinued on 02/16/18.  Unasyn started 02/16/18.  Blood cultures from 01/31/18 & 02/06/18 &  02/17/18 with no growth. Tracheal aspirate from 02/01/18 resulting few gram positive rods. Repeated 02/06/18 showed few yeast.  IR felt no safe window for drainage of pelvic fluid collection.  Repeat imaging showed improvement from 9/18.  CV: Tachycardia managed per Hospitalist. Telemetry, diuresis-daily IV lasix ordered, lopressor/labetalol/hydralazine ordered PRN.  Resp: ARDS in setting of aspiration pneumonia and sepsis.   Chest 1 view 02/23/18: IMPRESSION: Nasogastric tube tip projects in proximal stomach, stable appearance of remaining life support lines.  RIGHT upper lobe consolidation with RIGHT lower lobe atelectasis or early pneumonia. Similar interstitial prominence.  Aortic Atherosclerosis (ICD10-I70.0). Plan per CC- ATC during the day  and rest on PRVC overnight.  GI: Persistent abdominal distension- CT AP 9/18- ileus. TPN initiated 02/24/18. -Abd 1 view from am 9/20: -C diff negative. Flexiseal in place. -CT AP on 01/31/18 with findings consistent with GI perforation.  No obvious perforation identified on re-operation on 01/31/18. -TPN initially started 02/02/18 and discontinued 02/10/18 after tube feedings began. TPN restarted due to ileus. -02/13/18 -Enteric versus old hematoma contents in LLQ JP.  -CT pelvis 02/14/18: Stable to slight decrease in pelvic fluid collection, no evidence of contrast communication. Repeat CT AP 9/18- ileus -JP fluid culture from 02/13/18:  FEW WBC PRESENT, PREDOMINANTLY PMN  MODERATE GRAM NEGATIVE RODS FEW GRAM POSITIVE RODS  GU: AKI post-op resolved. Creatinine 0.48 this am. Purewick in place to suction with dark yellow urine in canister.   FEN: Replacement of K+ per Hospitalist /CC- Phos 3.6, Mag 1.9 this am.   Prophylaxis: SCDs in place. Continue Lovenox prophylaxis. No active bleeding.  Plan: Wound VAC placement today by WO Team Continue TPN. NG to suction and bowel rest until abd distension improves Continue care per Hospitalist Team, CC, ID, and Dr. Gerarda Fraction.  Melissa D Cross 02/25/2018, 9:22 AM  Patient seen and examined ~6pm today. Agree with assessment and plan as above. Wound VAC in place with serosanguinous fluid in collection cannister. Abdo distended with distant tinkling bowel sounds. No tenderness or rebound.

## 2018-02-26 ENCOUNTER — Inpatient Hospital Stay (HOSPITAL_COMMUNITY): Payer: Medicare PPO

## 2018-02-26 LAB — GLUCOSE, CAPILLARY
GLUCOSE-CAPILLARY: 133 mg/dL — AB (ref 70–99)
GLUCOSE-CAPILLARY: 138 mg/dL — AB (ref 70–99)
GLUCOSE-CAPILLARY: 145 mg/dL — AB (ref 70–99)
GLUCOSE-CAPILLARY: 153 mg/dL — AB (ref 70–99)
GLUCOSE-CAPILLARY: 171 mg/dL — AB (ref 70–99)
Glucose-Capillary: 156 mg/dL — ABNORMAL HIGH (ref 70–99)
Glucose-Capillary: 159 mg/dL — ABNORMAL HIGH (ref 70–99)

## 2018-02-26 LAB — BASIC METABOLIC PANEL
ANION GAP: 9 (ref 5–15)
ANION GAP: 11 (ref 5–15)
BUN: 18 mg/dL (ref 8–23)
BUN: 20 mg/dL (ref 8–23)
CALCIUM: 8.4 mg/dL — AB (ref 8.9–10.3)
CALCIUM: 8.6 mg/dL — AB (ref 8.9–10.3)
CO2: 29 mmol/L (ref 22–32)
CO2: 30 mmol/L (ref 22–32)
CREATININE: 0.44 mg/dL (ref 0.44–1.00)
Chloride: 95 mmol/L — ABNORMAL LOW (ref 98–111)
Chloride: 103 mmol/L (ref 98–111)
Creatinine, Ser: 0.56 mg/dL (ref 0.44–1.00)
GFR calc non Af Amer: >60 mL/min (ref >60)
Glucose, Bld: 855 mg/dL (ref 70–99)
Glucose, Bld: 147 mg/dL — ABNORMAL HIGH (ref 70–99)
Potassium: 4.7 mmol/L (ref 3.5–5.1)
Potassium: 2.8 mmol/L — ABNORMAL LOW (ref 3.5–5.1)
SODIUM: 133 mmol/L — AB (ref 135–145)
SODIUM: 144 mmol/L (ref 135–145)

## 2018-02-26 LAB — CBC WITH DIFFERENTIAL/PLATELET
BASOS ABS: 0 10*3/uL (ref 0.0–0.1)
BASOS PCT: 0 %
EOS ABS: 0.2 10*3/uL (ref 0.0–0.7)
Eosinophils Relative: 2 %
HCT: 28.8 % — ABNORMAL LOW (ref 36.0–46.0)
Hemoglobin: 9.1 g/dL — ABNORMAL LOW (ref 12.0–15.0)
Lymphocytes Relative: 16 %
Lymphs Abs: 1.6 10*3/uL (ref 0.7–4.0)
MCH: 25.5 pg — ABNORMAL LOW (ref 26.0–34.0)
MCHC: 31.6 g/dL (ref 30.0–36.0)
MCV: 80.7 fL (ref 78.0–100.0)
Monocytes Absolute: 0.8 10*3/uL (ref 0.1–1.0)
Monocytes Relative: 8 %
Neutro Abs: 7.3 10*3/uL (ref 1.7–7.7)
Neutrophils Relative %: 74 %
Platelets: 329 10*3/uL (ref 150–400)
RBC: 3.57 MIL/uL — ABNORMAL LOW (ref 3.87–5.11)
RDW: 21.7 % — ABNORMAL HIGH (ref 11.5–15.5)
WBC: 9.9 10*3/uL (ref 4.0–10.5)

## 2018-02-26 LAB — MAGNESIUM
MAGNESIUM: 2.3 mg/dL (ref 1.7–2.4)
MAGNESIUM: 1.8 mg/dL (ref 1.7–2.4)

## 2018-02-26 LAB — PHOSPHORUS
PHOSPHORUS: 6.7 mg/dL — AB (ref 2.5–4.6)
PHOSPHORUS: 3.8 mg/dL (ref 2.5–4.6)

## 2018-02-26 MED ORDER — TRACE MINERALS CR-CU-MN-SE-ZN 10-1000-500-60 MCG/ML IV SOLN
INTRAVENOUS | Status: AC
  Administered 2018-02-26: 18:00:00 via INTRAVENOUS
  Filled 2018-02-26 (×2): qty 1800

## 2018-02-26 MED ORDER — POTASSIUM CHLORIDE 10 MEQ/50ML IV SOLN
INTRAVENOUS | Status: AC
  Filled 2018-02-26: qty 50

## 2018-02-26 MED ORDER — FAT EMULSION PLANT BASED 20 % IV EMUL
240.0000 mL | INTRAVENOUS | Status: AC
  Administered 2018-02-26: 240 mL via INTRAVENOUS
  Filled 2018-02-26: qty 250

## 2018-02-26 MED ORDER — FENTANYL CITRATE (PF) 100 MCG/2ML IJ SOLN
50.0000 ug | Freq: Once | INTRAMUSCULAR | Status: AC
  Administered 2018-02-26: 50 ug via INTRAVENOUS
  Filled 2018-02-26: qty 2

## 2018-02-26 MED ORDER — MAGNESIUM SULFATE 2 GM/50ML IV SOLN
2.0000 g | Freq: Once | INTRAVENOUS | Status: AC
  Administered 2018-02-26: 2 g via INTRAVENOUS
  Filled 2018-02-26: qty 50

## 2018-02-26 MED ORDER — TRACE MINERALS CR-CU-MN-SE-ZN 10-1000-500-60 MCG/ML IV SOLN: INTRAVENOUS | Status: DC

## 2018-02-26 MED ORDER — POTASSIUM CHLORIDE 10 MEQ/100ML IV SOLN
10.0000 meq | INTRAVENOUS | Status: AC
  Administered 2018-02-26 (×5): 10 meq via INTRAVENOUS
  Filled 2018-02-26 (×5): qty 100

## 2018-02-26 MED ORDER — FAT EMULSION PLANT BASED 20 % IV EMUL: 240.0000 mL | INTRAVENOUS | Status: DC

## 2018-02-26 MED ORDER — TRACE MINERALS CR-CU-MN-SE-ZN 10-1000-500-60 MCG/ML IV SOLN
INTRAVENOUS | Status: DC
  Filled 2018-02-26: qty 1800

## 2018-02-26 MED ORDER — POTASSIUM CHLORIDE 10 MEQ/100ML IV SOLN
10.0000 meq | Freq: Once | INTRAVENOUS | Status: AC
  Administered 2018-02-26: 10 meq via INTRAVENOUS

## 2018-02-26 MED ORDER — FUROSEMIDE 10 MG/ML IJ SOLN
20.0000 mg | Freq: Every day | INTRAMUSCULAR | Status: DC
  Administered 2018-02-27 – 2018-03-04 (×6): 20 mg via INTRAVENOUS
  Filled 2018-02-26 (×6): qty 2

## 2018-02-26 MED ORDER — FENTANYL 25 MCG/HR TD PT72
25.0000 ug | MEDICATED_PATCH | TRANSDERMAL | Status: DC
  Administered 2018-02-26 – 2018-03-01 (×2): 25 ug via TRANSDERMAL
  Filled 2018-02-26 (×2): qty 1

## 2018-02-26 MED ORDER — FAT EMULSION PLANT BASED 20 % IV EMUL
240.0000 mL | INTRAVENOUS | Status: DC
  Filled 2018-02-26: qty 250

## 2018-02-26 NOTE — Progress Notes (Signed)
Gynecologic Oncology Progress Note  72 y.o. female diagnosed with Stage IV ovarian cancer who received four cycles of neoadjuvant chemotherapy with carboplatin and taxol with fourth cycle completed on 12/20/17 .  She underwent interval debulking surgery with Dr. Skeet Latch on 01/27/18 consisting of an exploratory laparotomy infra-gastric omentectomy bilateral salpingo-oophorectomy radical optimal tumor debulking, resection of anterior abdominal wall.  Her post-operative course was complicated by findings to suggest bowel perforation on CT from 01/31/18 post-op with no site visualized on re-operation. It was felt that aspiration most likely occurred during intubation with the re-operation.          Subjective: Patient alert. Pulled NGT out last night, already replaced. Nods to questions. Smiles at times.  Objective: Vital signs in last 24 hours: Temp:  [98.4 F (36.9 C)-99.5 F (37.5 C)] 98.8 F (37.1 C) (09/21 0800) Pulse Rate:  [86-115] 99 (09/21 1000) Resp:  [13-28] 23 (09/21 1000) BP: (141-186)/(63-91) 164/69 (09/21 1000) SpO2:  [98 %-100 %] 100 % (09/21 1000) FiO2 (%):  [28 %-30 %] 30 % (09/21 0346) Weight:  [118 lb 9.7 oz (53.8 kg)] 118 lb 9.7 oz (53.8 kg) (09/21 0455) Last BM Date: 02/26/18  Intake/Output from previous day: 09/20 0701 - 09/21 0700 In: 2379.3 [I.V.:1269.3; IV Piggyback:1110] Out: 2380 [Urine:1900; Emesis/NG output:200; Drains:30; Stool:250]  Physical Examination:  General: alert, appears comfortable, nodding head when asked questions  Cardio: RRR  Resp: Coarse breath sounds.  Rhonchi present.  Trach in place.  Abdo:   tight, distended (similar to 9/20 assessment) with NABS on left, hyperactive with distant tinkling on right.   NG suction on LIS with 200cc noted overnight.   VAC in place with serosanguinous output.  LLQ JP with more serous output today  Extremities: SCD in place  Flexiseal in place with light brown liquid stool in bag (100 cc output on  9/19).   Urine wick in place on suction with clear urine  Labs: WBC/Hgb/Hct/Plts:  9.9/9.1/28.8/329 (09/21 1601) BUN/Cr/glu/ALT/AST/amyl/lip:  20/0.44/--/--/--/--/-- (09/21 0932)     Assessment: 72 y.o. s/p Procedure(s): Interval debulking surgery with Dr. Skeet Latch on 01/27/18 consisting of an exploratory laparotomy infra-gastric omentectomy bilateral salpingo-oophorectomy radical optimal tumor debulking, resection of anterior abdominal wall.  Reoperation of EXPLORATORY LAPAROTOMY, ABDOMINAL WASHOUT on 01/31/18:   Neuro: continued improvement in delirium.   Heme:  Post-operative anemia: Hgb 8.9 and Hct 29.3 this am. Last transfusion 02/19/18.  Anemia related to acute critical illness, no evidence of active bleeding.   ID: Fevers resolved.  Highest temp last 24 hr 99.5  Urine for culture with no growth and stool for c diff negative from 02/17/18. Blood cultures drawn 02/17/18 with no growth.  Presumed sepsis initially from gastrointestinal source but unable to identify the perforation after exp lap on 01/31/18. Contribution from pneumonia and possibly undrained left pelvic fluid collection seen on CT scan 02/07/18; however the pelvic collection is improving. 02/13/18 enteric versus old hematoma appearing contents in JP. Cultures showing FEW WBC PRESENT, PREDOMINANTLY PMN MODERATE GRAM NEGATIVE RODS FEW GRAM POSITIVE RODS.   Followup CT have shown decrease in pelvic fluid collection.  Granix received 02/01/18 per Dr. Alvy Bimler.  Vancomycin stopped on 02/04/18 and then fevers began again. Restarted Vancomycin on 02/06/18. Anidulafungin initiated 02/01/18.  Meropenum IV started 01/31/18 and discontinued on 02/16/18.  Unasyn started 02/16/18.  Blood cultures from 01/31/18 & 02/06/18 &  02/17/18 with no growth. Tracheal aspirate from 02/01/18 resulting few gram positive rods. Repeated 02/06/18 showed few yeast.  CV: Tachycardia managed per Hospitalist.  Resp: ARDS in setting of aspiration pneumonia and sepsis.   Per  ICU/Pulm/Critical care  GI:  -CT AP on 01/31/18 with findings consistent with GI perforation.  No obvious perforation identified on re-operation on 01/31/18. -C diff negative.  - Flexiseal in place.with brown liquid stool -TPN initially started 02/02/18 and discontinued 02/10/18 after tube feedings began. TPN restarted due to ileus 02/24/18 - Persistent abdominal distension- CT AP 9/18- ileus. TPN initiated 02/24/18. -02/13/18 -Enteric versus old hematoma contents in LLQ JP.  -JP fluid culture from 02/13/18:  FEW WBC PRESENT, PREDOMINANTLY PMN  MODERATE GRAM NEGATIVE RODS FEW GRAM POSITIVE RODS - CT pelvis 02/14/18: Stable to slight decrease in pelvic fluid collection, no evidence of contrast communication.  - Repeat CT AP 9/18- ileus  GU: AKI post-op resolved.  Purewick in place to suction with normal appearing urine in canister.   FEN: Replacement of K+ per Hospitalist /CC  Prophylaxis: SCDs in place. Continue Lovenox prophylaxis. No active bleeding.  ADL:  - PTx working with patient. Goal to chair by Monday - SLP working with patient on speech and presumably will help with swallowing eval once we clear her GI tract for oral intake.  Plan: Wound VAC to continue. Will ask team page [name] Monday at time of VAC change Continue TPN. NG to suction and bowel rest until abd distension improves. Reviewed with nursing reinforcement of NGT tape and consideration for additional ways to prevent the tube from being pulled.  Jo Parker 02/26/2018, 10:24 AM

## 2018-02-26 NOTE — Progress Notes (Addendum)
CRITICAL VALUE ALERT  Critical Value:  CBG from blood draw >800  Date & Time Notied:  02/26/18 0600  Provider Notified: NO  Orders Received/Actions taken: CBG was rechecked via fingerstick with a finding of 156, which is consistent with trending CGBs for this patient.    All labs redrawn due to poor samples at 0600.

## 2018-02-26 NOTE — Progress Notes (Signed)
Pt has weaned all day. RT thinks the Pt will tolerated being off the ventilator

## 2018-02-26 NOTE — Progress Notes (Signed)
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  She is completed 3 cycles of chemotherapy and is now status post bilateral salpingo-oophorectomy and omentectomy with debulking on 8/22.   8/26 transferred to ICU w/ concern for perforation w/ free air noted.  Imipenem started. Went for exploratory laparotomy with washout.  No significant contamination identified, presumed spontaneously closed microperf. Returned with abdominal drains postoperative chest x-ray showing evidence of aspiration pneumonia; mixed respiratory and metabolic acidosis & in septic shock. Required intubation.   8/27 GI medicine consulted, did not feel she had overt GIB.  Rec's prophylaxis with PPI.  Signed off.  Weaning pressors. Hgb dropped from 8.8 to 7.4; got 2 units of blood,  febrile, added. vancomycin.  Neupogen given for neutropenia.   8/28-9/1 Shock resolved, TPN started. Fevers. Delirium barrier to weaning.  ABX for fever  9/02-9/6 Delirium. Sedation difficult, diuresing, remains on abx. spiking temps, loculated fluid collection identified in left pelvis. Not amedable to drainage.  ID consulted. Started seroquel. 9/4. Upper 2 drains removed. Staples removed.from incision site.  9/4-9/9 ID consulted by family.  Fevers ongoing, CT abd with improved fluid collections.  Failing PSV wean.  No pressors.  No sedation.   Husband deferring decisions about care to son (newly married).   9/10  Sister at bedside - says she is not in favor of trach but says is the decision of son/dpoa. Son met with Dr Alvy Bimler for goals of care - current status precludes chemo but goal is trach and full code and full medical care and later chemo when ECOG improves.  Low grade fever persists - ID following. More  awake today. No Perf leak on CT abd and restarted TF  9/12-9/14  s/p Trach - Dr Nelda Marseille. Levophed post trach.  Fevers.  1 unit PRBC  9/15 ATC for first time; tolerating well. Did PRVC overnight  919 is been requiring full mechanical ventilatory support due to abdominal distention  Subjective / Interval Events  -Full mechanical ventilatory support for over 48 hours due to abdominal distention -Has been tolerating ATM today without significant problems   Objective    Blood pressure (!) 150/65, pulse 92, temperature 99.2 F (37.3 C), temperature source Oral, resp. rate 17, height _0  (1.6 m), weight 53.8 kg, SpO2 100 %.    Vent Mode: PRVC FiO2 (%):  [28 %-30 %] 28 % Set Rate:  [20 bmp] 20 bmp Vt Set:  [330 mL] 330 mL PEEP:  [5 cmH20] 5 cmH20 Plateau Pressure:  [16 cmH20-17 cmH20] 16 cmH20   Intake/Output Summary (Last 24 hours) at 02/26/2018 1628 Last data filed at 02/26/2018 1624 Gross per 24 hour  Intake 3099.56 ml  Output 850 ml  Net 2249.56 ml   Filed Weights   02/24/18 0441 02/25/18 0658 02/26/18 0455  Weight: 54.9 kg 54.5 kg 53.8 kg     EXAM  General: Frail, cachectic HEENT: Trach in place, on ATM, site is fair Neuro: Awake and alert, following commands, very frail CV: S1-S2 appreciated PULM: Unlabored, clear GI: Distended, nontender  extremities: warm/dry, negative edema    Labs    PULMONARY No labs  CBC Recent Labs  Lab 02/24/18 0330 02/25/18 0455 02/26/18 0620  HGB 8.9* 8.9* 9.1*  HCT 28.1* 29.3* 28.8*  WBC  9.0 9.3 9.9  PLT 359 347 329    COAGULATION No results for input(s): INR in the last 168 hours. CARDIAC   No results for input(s): TROPONINI in the last 168 hours. No results for input(s): PROBNP in the last 168 hours.  CHEMISTRY Recent Labs  Lab 02/23/18 0645 02/24/18 0330 02/24/18 1140 02/25/18 0455 02/26/18 0454 02/26/18 0620  NA 145  --  148* 145 133* 144  K 3.2*  --  2.4* 3.4* 4.7 2.8*  CL 99  --  115* 105 95* 103  CO2  35*  --  27 32 29 30  GLUCOSE 119*  --  89 150* 855* 147*  BUN 14  --  _0 CREATININE 0.54  --  0.39* 0.48 0.56 0.44  CALCIUM 8.7*  --  6.6* 8.7* 8.4* 8.6*  MG 1.6* 1.9  --  1.9 2.3 1.8  PHOS 4.0 3.8  --  3.6 6.7* 3.8   Estimated Creatinine Clearance: 52.6 mL/min (by C-G formula based on SCr of 0.44 mg/dL).  LIVER Recent Labs  Lab 02/20/18 0416 02/21/18 0323 02/22/18 0326 02/23/18 0645 02/25/18 0455  AST _1 ALT _2 ALKPHOS 103 89 84 91 79  BILITOT 0.7 0.3 0.6 0.6 0.8  PROT 6.5 6.5 6.8 7.6 7.5  ALBUMIN 1.7* 1.6* 1.7* 1.9* 2.0*   INFECTIOUS No results for input(s): LATICACIDVEN, PROCALCITON in the last 168 hours.  ENDOCRINE CBG (last 3)  Recent Labs    02/26/18 0749 02/26/18 1215 02/26/18 1542  GLUCAP 145* 138* 159*   IMAGING Abdomen CT reports reviewed  Micro Data: Blood Culture x2 8/26 >> neg Sputum 8/27: Abundant yeast, few Candida albicans Blood Cx 9/1 >> negative  Respiratory 9/1 >> few Candida Urine 9/1 >> negative Peritoneal fluid 8/31 >> neg Abd Drainage 9/5 >> few lactobacillus  JP Drainage 9/8 >> E-Coli C-Diff 9/12 >> negative  Antimicrobials:  Meropenem 8/26 >> 9/11 Vancomycin 8/27 >> 8/30; 9/1 >> 9/11, 9/17>> Eraxis 8/27 >>  Unasyn 9/11 >>   Studies: CT abdomen 8/26: Result noted   echocardiogram 8/27: Particular ejection fraction 65 to 70%.  She has moderate LVH, and hyperdynamic LV function.  There is grade 1 diastolic dysfunction.  Trivial MR.  Normal left atrial size.  The IV C was not visualized  CT abdomen 9/2 >> drainage catheters within the pelvis, RUQ, LUQ.  Significant interval decrease in size of previously described intra-abdominal fluid, no residual free intraperitoneal air. There is a persistent loculated fluid & gas collection along the left pelvic sidewall measuring up to 10 cm.  Moderate bilateral pleural effusions, underlying opacities favored to represent atelectasis.  Marked wall thickening  of the sigmoid colon.  Peripheral low attenuation within the spleen concerning for splenic infarcts.  Wall thickening of the urinary bladder.   CT abdomen 9/6: Improving left lateral pelvic fluid collection, resolved peritoneal fluid in the left lower abdomen.  Concentric thickening of the sigmoid colon.  Decreased bilateral effusion and atelectasis.  CT ABD 9/9 Similar to slight decrease in size of ill-defined complex fluid and gas in the lateral left hemipelvis. 2. No evidence of contrast communication between the thickened sigmoid and this pelvic sidewall process. 3. Foley catheter and air within theurinary bladder, which is not decompressed. Correlate with catheter function.   Assessment & Plan:   Acute on Chronic Hypoxemic Respiratory Failure s/p Trach (9/12) Aspiration pneumonia and ARDS She is stabilizing, improving Tolerating ATM without  difficulty We will attempt to leave off the ventilator tonight Continue trach collar as tolerated  Right pleural effusion - No sig pocket to drain on bedside US 9/5.   P:  Follow radiologically  Status post exploratory laparotomy for free air on 8/26 ; following bilateral salpingo-oophorectomy, omentectomy, and debulking surgery on 8/22. Developed fluid collections not amenable to IR drainage.  Reducing in size on CTA/P P:  Recommendations per GYN oncology  Severe Sepsis  Peritonitis, aspiration pneumonia  P:  Antibiotics per ID  Hypertension, diastolic dysfunction  - on admit resolved.  Likely demand related troponin elevation  Acute metabolic encephalopathy -Mental status is improving  Severe Protein Calorie Malnutrition, prealbumin 3.5 P:  Per [facility] hospitalist service  Postoperative Anemia.  Recent Labs    02/25/18 0455 02/26/18 0620  HGB 8.9* 9.1*    - S/p 1 unit PRBC 02/19/18 P: By [facility] hospitalist service  Hyperglycemia P: Sliding scale insulin per primary  Will continue to check on patient every few days Leave  off ventilator as tolerated  Best practice/Goals of care  DVT prophylaxis: LMWH GI prophylaxis: PPI Diet: Currently n.p.o. with NG tube in place to suction Mobility: BR Code Status: Full Code Family Communication: Patient has been updated at bedside 02/24/2018.   Disposition / Summary of Today's Plan 02/26/2018  Has been a full vent support for the last 48 hours due to abdominal distention.  We will attempt to place on trach collar 02/24/2018.  Pulmonary critical care will continue to see 1-2 times a week.  Sherrilyn Rist, MD Cell number: [phone]

## 2018-02-26 NOTE — Progress Notes (Signed)
RT removed sutures at [employer]

## 2018-02-26 NOTE — Progress Notes (Signed)
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 She is completed 3 cycles of chemotherapy and is now status post bilateral salpingo-oophorectomy and omentectomy with debulking on 8/22.   8/26 transferred to ICU w/ concern for perforation w/ free air noted. Imipenem started. Went for exploratory laparotomy with washout. No significant contamination identified, presumed spontaneously closed microperf. Returned with abdominal drains postoperative chest x-ray showing evidence of aspiration pneumonia; mixed respiratory and metabolic acidosis &in septic shock. Required intubation.   8/27 GI medicine consulted, did not feel she had overt GIB. Rec's prophylaxis with PPI. Signed off. Weaning pressors. Hgb dropped from 8.8 to 7.4; got 2 units of blood, febrile, added. vancomycin. Neupogen given for neutropenia.   8/28-9/1 Shock resolved, TPN started. Fevers. Delirium barrier to weaning. ABX for fever  9/02-9/6 Delirium. Sedation difficult, diuresing, remains on abx. spiking temps, loculated fluid collection identified in left pelvis. Not amedable to drainage. ID consulted. Started seroquel. 9/4. Upper 2 drains removed. Staples removed.from incision site.  9/4-9/9 ID consulted by family. Fevers ongoing, CT abd with improved fluid collections. Failing PSV wean. No pressors. No sedation. Husband deferring decisions about care to son (newly married).   9/10 Sister at bedside - says she is not in favor of trach but says is the decision of son/dpoa. Son met with Dr Alvy Bimler for goals of care - current status precludes chemo but goal is trach and full code and full medical care and later chemo when ECOG improves. Low grade fever persists - ID following. More awake today. No Perf leak on CT abd and restarted  TF  9/12-9/14s/p Trach - Dr Nelda Marseille. Levophed post trach. Fevers. 1 unit PRBC,   9/14 - [facility] consulted for medical management   9/18 - CT abdomen with ileus   9/19 TPN started   Assessment & Plan:   Principal Problem:   Pelvic abscess in female Active Problems:   Primary peritoneal carcinomatosis ([place])   Severe protein-calorie malnutrition ([place])   Severe sepsis ([place])   COPD (chronic obstructive pulmonary disease) ([name])   HLD (hyperlipidemia)   Absolute anemia   White coat syndrome with hypertension   Ovarian cancer ([place])   AKI (acute kidney injury) ([place])   Acute metabolic encephalopathy   Acute respiratory distress   Low urine output   Somnolence   At risk for fluid volume overload   Free intraperitoneal air   Acute on chronic respiratory failure ([name])   Pressure injury of skin   Status post bilateral salpingo-oophorectomy   Peritonitis ([place])   HCAP (healthcare-associated pneumonia)   Postoperative wound infection   Aspiration pneumonia ([name])   Wound dehiscence, surgical, initial encounter  Acute on chronic hypoxemic respiratory failure status post trach (9/12) - stable  In setting of aspiration pneumonia/ARDS Right-sided pleural effusion, per PCCM no significant pocket to drain on bedside ultrasound.  Patient tolerating ATC well during the day. Vent only at night. Continue pulmonary hygiene as able. Continue Lasix. Continue trach management per pulmonary team.  Status post exploratory laparotomy for free air on 8/26; following bilateral salpingo-oophorectomy, omentectomy and debulking surgery on 8/22. S/p I&D from wound dehiscence 9/19   Continue management and wound care per OB/GYN/ONC  Abdominal pain/Ileus  CT abdomen shows persistent ileus. Keep patient n.p.o. Keep NG tube to low suction. Continue to replete potassium to keep above 4 and magnesium above 2. On  TPN  started 02/24/2018  Severe sepsis pelvic abscess, peritonitis - sepsis physiology  resolved Initially with peritonitis (microperforation) subsequently complicated by pneumonia. Being followed by ID.  Defer antibiotic therapy per ID and primary team.  HTN  BP remains elevated despite schedule IV metoprolol. Will add vasotec 0.625 mg IV q6 hrs, continue hydralazine and labetalol PRN. Echo LVEF 65 to 70% with moderate LVH, grade 1 diastolic dysfunction.  Patient also on daily IV Lasix.  Severe protein calorie malnutrition Holding tube feeding due to abdominal distention/pain, likely malabsorption  Continue TPN per pharmacy, monitor electrolytes   Postoperative anemia Status post 1 unit PRBCs, Hgb remains stable  Transfuse if hemoglobin less than 7. Hypokalemia/hypomagnesemia Replete as needed, keep K > 4 and mag > 2  Acute metabolic encephalopathy - resolved In setting of critical illness and sedation. Celexa and Seroquel d/ced as patient not able to tolerate p.o. meds at this time.  If agitation or delirium will try IV Haldol.  Deconditioning  Patient will benefit from LTAC  Continue PT per ICU protocol   Code Status: Full Code  Family Communication: Husband at bedside    Subjective: Pulled out NG tube last night this has been replaced since then.  Restless in bed trying to sit up wants to put the legs down.  Husband by the bedside.  Objective: Vitals:   02/26/18 0511 02/26/18 0800 02/26/18 0900 02/26/18 1000  BP:  (!) 159/72 (!) 157/68 (!) 164/69  Pulse:  99 (!) 103 99  Resp:  17 (!) 28 (!) 23  Temp: 99.1 F (37.3 C) 98.8 F (37.1 C)    TempSrc: Oral Oral    SpO2:  100% 99% 100%  Weight:      Height:        Intake/Output Summary (Last 24 hours) at 02/26/2018 1110 Last data filed at 02/26/2018 1002 Gross per 24 hour  Intake 2438.24 ml  Output 2350 ml  Net 88.24 ml   Filed Weights   02/24/18 0441 02/25/18 0658 02/26/18 0455  Weight: 54.9 kg 54.5 kg 53.8 kg    Examination:  General exam: Appears calm and comfortable  Respiratory  system: Clear to auscultation. Respiratory effort normal. Cardiovascular system: S1 & S2 heard, RRR. No JVD, murmurs, rubs, gallops or clicks. No pedal edema. Gastrointestinal system: Abdomen is DISTENDED and tender. No organomegaly or masses felt. DECREASED  bowel sounds heard.  Wound VAC and JP drain in place Extremities: Symmetric 5 x 5 power. Skin: No rashes, lesions or ulcers     Data Reviewed: I have personally reviewed following labs and imaging studies  CBC: Recent Labs  Lab 02/22/18 0326 02/23/18 0645 02/24/18 0330 02/25/18 0455 02/26/18 0620  WBC 7.7 11.0* 9.0 9.3 9.9  NEUTROABS 5.0 8.0* 6.3 6.8 7.3  HGB 8.6* 10.0* 8.9* 8.9* 9.1*  HCT 27.7* 31.5* 28.1* 29.3* 28.8*  MCV 81.7 80.8 81.2 82.3 80.7  PLT 372 379 359 347 010   Basic Metabolic Panel: Recent Labs  Lab 02/23/18 0645 02/24/18 0330 02/24/18 1140 02/25/18 0455 02/26/18 0454 02/26/18 0620  NA 145  --  148* 145 133* 144  K 3.2*  --  2.4* 3.4* 4.7 2.8*  CL 99  --  115* 105 95* 103  CO2 35*  --  27 32 29 30  GLUCOSE 119*  --  89 150* 855* 147*  BUN 14  --  '11 14 18 20  '$ CREATININE 0.54  --  0.39* 0.48 0.56 0.44  CALCIUM 8.7*  --  6.6*  8.7* 8.4* 8.6*  MG 1.6* 1.9  --  1.9 2.3 1.8  PHOS 4.0 3.8  --  3.6 6.7* 3.8   GFR: Estimated Creatinine Clearance: 52.6 mL/min (by C-G formula based on SCr of 0.44 mg/dL). Liver Function Tests: Recent Labs  Lab 02/20/18 0416 02/21/18 0323 02/22/18 0326 02/23/18 0645 02/25/18 0455  AST '27 27 29 26 19  '$ ALT '23 21 23 24 18  '$ ALKPHOS 103 89 84 91 79  BILITOT 0.7 0.3 0.6 0.6 0.8  PROT 6.5 6.5 6.8 7.6 7.5  ALBUMIN 1.7* 1.6* 1.7* 1.9* 2.0*   No results for input(s): LIPASE, AMYLASE in the last 168 hours. No results for input(s): AMMONIA in the last 168 hours. Coagulation Profile: No results for input(s): INR, PROTIME in the last 168 hours. Cardiac Enzymes: No results for input(s): CKTOTAL, CKMB, CKMBINDEX, TROPONINI in the last 168 hours. BNP (last 3 results) No  results for input(s): PROBNP in the last 8760 hours. HbA1C: No results for input(s): HGBA1C in the last 72 hours. CBG: Recent Labs  Lab 02/25/18 2008 02/26/18 0040 02/26/18 0458 02/26/18 0502 02/26/18 0749  GLUCAP 164* 171* >600* 156* 145*   Lipid Profile: Recent Labs    02/25/18 0455  CHOL 119  HDL 28*  LDLCALC 73  TRIG 90  94  CHOLHDL 4.3   Thyroid Function Tests: No results for input(s): TSH, T4TOTAL, FREET4, T3FREE, THYROIDAB in the last 72 hours. Anemia Panel: No results for input(s): VITAMINB12, FOLATE, FERRITIN, TIBC, IRON, RETICCTPCT in the last 72 hours. Sepsis Labs: No results for input(s): PROCALCITON, LATICACIDVEN in the last 168 hours.  Recent Results (from the past 240 hour(s))  Culture, Urine     Status: None   Collection Time: 02/17/18 12:45 PM  Result Value Ref Range Status   Specimen Description   Final    URINE, CATHETERIZED Performed at [facility] [address], [place], [place] [address]    Special Requests   Final    Immunocompromised Performed at [facility], [place] [address], [place], [place] [address]    Culture   Final    NO GROWTH Performed at [facility], [place] [address], [place], [place] [address]    Report Status 02/18/2018 FINAL  Final  Culture, blood (routine x 2)     Status: None   Collection Time: 02/17/18  1:12 PM  Result Value Ref Range Status   Specimen Description   Final    BLOOD RIGHT ANTECUBITAL Performed at [facility] [address], [place], [place] [address]    Special Requests   Final    BOTTLES DRAWN AEROBIC ONLY Blood Culture adequate volume Performed at [facility] [address], [place], [place] [address]    Culture   Final    NO GROWTH 5 DAYS Performed at [facility], [place] [address], [place], [place] [address]    Report Status 02/22/2018 FINAL  Final  Culture, blood (routine x 2)     Status: None    Collection Time: 02/17/18  1:14 PM  Result Value Ref Range Status   Specimen Description   Final    BLOOD RIGHT ANTECUBITAL Performed at [facility] [address] [place], [place] [address]    Special Requests   Final    BOTTLES DRAWN AEROBIC ONLY Blood Culture adequate volume Performed at [facility] [address], [place],  [address]    Culture  Final    NO GROWTH 5 DAYS Performed at [facility], [place] [address], [place], [place] [address]    Report Status 02/22/2018 FINAL  Final  C difficile quick scan w PCR reflex     Status: None   Collection Time: 02/17/18  3:08 PM  Result Value Ref Range Status   C Diff antigen NEGATIVE NEGATIVE Final   C Diff toxin NEGATIVE NEGATIVE Final   C Diff interpretation No C. difficile detected.  Final    Comment: Performed at [facility], [place] [address], [name], [place] [address]         Radiology Studies: Dg Abd 1 View  Result Date: 02/25/2018 CLINICAL DATA:  Nasogastric tube placement. EXAM: ABDOMEN - 1 VIEW COMPARISON:  Abdominal radiograph February 25, 2018 at 0934 hours FINDINGS: Nasogastric tube tip projects in mid stomach, side port past GE junction. Multiple loops of gas filled small and large bowel, incompletely imaged. Soft tissue planes and included osseous structures are non suspicious. Lung bases obscured. IMPRESSION: Nasogastric tube tip projecting in mid stomach. Similar gas distended bowel, probable ileus. Electronically Signed   By: Elon Alas M.D.   On: 02/25/2018 20:59   Dg Abd Portable 1v  Result Date: 02/25/2018 CLINICAL DATA:  Nasogastric tube placement. EXAM: PORTABLE ABDOMEN - 1 VIEW COMPARISON:  Radiograph of February 22, 2018. FINDINGS: Stable diffuse large and small bowel dilatation is noted most consistent with ileus. Distal tip of feeding tube is seen in proximal stomach with side hole in expected position of gastroesophageal  junction. The catheter appears to have been retracted slightly compared to prior exam. IMPRESSION: Nasogastric tube appears to have been retracted slightly compared to prior exam, with distal tip now seen in expected position of proximal stomach with side hole in expected position of gastroesophageal junction. Stable diffuse bowel dilatation is noted most consistent with ileus. These results will be called to the ordering clinician or representative by the Radiologist Assistant, and communication documented in the PACS or zVision Dashboard. Electronically Signed   By: Marijo Conception, M.D.   On: 02/25/2018 10:07        Scheduled Meds: . chlorhexidine gluconate (MEDLINE KIT)  15 mL Mouth Rinse BID  . enalaprilat  0.625 mg Intravenous Q6H  . enoxaparin (LOVENOX) injection  40 mg Subcutaneous Q24H  . furosemide  40 mg Intravenous Daily  . insulin aspart  0-20 Units Subcutaneous Q4H  . ipratropium-albuterol  3 mL Nebulization TID  . mouth rinse  15 mL Mouth Rinse 10 times per day  . metoprolol tartrate  5 mg Intravenous Q6H  . sodium chloride flush  10-40 mL Intracatheter Q12H   Continuous Infusions: . sodium chloride 250 mL (02/26/18 1000)  . sodium chloride 250 mL (02/26/18 0954)  . ampicillin-sulbactam (UNASYN) IV Stopped (02/26/18 7564)  . anidulafungin 78 mL/hr at 02/26/18 0926  . [place] KitchenTPN (CLINIMIX-E) Adult     And  . Fat emulsion    . potassium chloride 10 mEq (02/26/18 1104)  . [place] KitchenTPN (CLINIMIX-E) Adult 75 mL/hr at 02/26/18 0926  . vancomycin Stopped (02/25/18 1427)     LOS: 30 days     Georgette Shell, MD [facility]  If 7PM-7AM, please contact night-coverage [email or web address] Password [email or web address] 02/26/2018, 11:10 AM

## 2018-02-26 NOTE — Progress Notes (Signed)
Dr. Ander Slade said to keep Pt on trach collar QHS as Pt tolerates

## 2018-02-26 NOTE — Progress Notes (Signed)
[place] NOTE   Pharmacy Consult for TPN Indication: ileus  Patient Measurements: Height: 5\' 3"  (160 cm) Weight: 121 lb 0.5 oz (54.9 kg) IBW/kg (Calculated) : 52.4 TPN AdjBW (KG): 58.3 Body mass index is 21.44 kg/m.  Body mass index is 21.01 kg/m. Filed Weights   02/24/18 0441 02/25/18 0658 02/26/18 0455  Weight: 121 lb 0.5 oz (54.9 kg) 120 lb 2.4 oz (54.5 kg) 118 lb 9.7 oz (53.8 kg)    HPI: Pt is a 72 year old with ovarian cancer status post bilateral salpingo-oophorectomy and omentectomy with debulking on 8/22. Ex lap on 8/26 and transferred to ICU afterward. Remains critically ill on vent support, now s/p trach placement. Was previously on TPN 8/27-9/5, however has lately had issues tolerating enteral nutrition. TFs held since 9/17 d/t abdominal distention; CT abd shows persistent ileus. NG output remains high. [facility] resuming TPN per pharmacy on 9/19.  Significant events:  - 8/27 Start TPN. Hold lipids for first 7 days while pt in ICU  - 8/30 scheduled Novolog insulin added q4h - 9/2 CT scan of abdomenshowsundrained left pelvic fluid collection, IR consulted - defer drainage for now - 9/4 Lipids started with TPN. Tube feeds (Vital AF 1.2 Cal) initiated at 98mL/hr, order to increase by 71mL/hr every 8 hours to goal rate of 44mL/hr - 9/5 TPN weaned off - 9/19 resume TPN   Insulin requirements past 24 hours: 9 units resistant SSI / 24 hrs  (no hx DM)  Current Nutrition: NPO, TF held  IVF: None  Central access: 02/01/18 TPN start date: 8/27-9/5, restart 9/19  ASSESSMENT                                                                                                          Today:   Glucose:  <180, 12 units insulin given since rate increase to goal  Electrolytes: (Goals K >4, Mg >2) K 2.8, Mag 1.8, CorrCa 10.1  Lasix 40mg  IV daily  Renal:  SCr 0.44  LFTs:  WNL (9/20)  TGs:  90 (9/20)  Prealbumin: 3.5 (9/2), 10.6  (9/20)   NUTRITIONAL GOALS                                                                                             RD recs (9/17):  82-93g Protein, 1750-1925 Kcal per day Clinimix 5/15 at a goal rate of 75 ml/hr + 20% fat emulsion 240 ml/day to provide: 90 g of protein and 1758 kCals per day meeting 100% of protein and 100% of kCal needs.  PLAN  Now: Magnesium 2g IV x 1 Potassium 38meq IV x 6 runs  At 1800 today:  Continue Clinimix E 5/15 at 75 ml/hr. (goal rate)  20% fat emulsion at 20 ml/hr x12 hours/day  TPN to contain standard multivitamins and trace element daily  TPN to contain Pepcid 40mg  IV daily  IVF per MD, currently none  Continue CBGs and SSI q4h (resistant)  TPN lab panels on Mondays & Thursdays  BMET, Mag, Phos daily for [place]  Bertis Ruddy, PharmD Clinical Pharmacist Please check AMION for all [place] numbers 02/26/2018 8:57 AM

## 2018-02-27 LAB — BASIC METABOLIC PANEL
Anion gap: 8 (ref 5–15)
BUN: 22 mg/dL (ref 8–23)
CO2: 30 mmol/L (ref 22–32)
Calcium: 8.4 mg/dL — ABNORMAL LOW (ref 8.9–10.3)
Chloride: 102 mmol/L (ref 98–111)
Creatinine, Ser: 0.47 mg/dL (ref 0.44–1.00)
GFR calc Af Amer: >60 mL/min (ref >60)
GFR calc non Af Amer: >60 mL/min (ref >60)
GLUCOSE: 140 mg/dL — AB (ref 70–99)
POTASSIUM: 3 mmol/L — AB (ref 3.5–5.1)
Sodium: 140 mmol/L (ref 135–145)

## 2018-02-27 LAB — CBC WITH DIFFERENTIAL/PLATELET
BASOS PCT: 0 %
Basophils Absolute: 0 10*3/uL (ref 0.0–0.1)
EOS ABS: 0.2 10*3/uL (ref 0.0–0.7)
EOS PCT: 2 %
HEMATOCRIT: 28.2 % — AB (ref 36.0–46.0)
Hemoglobin: 9 g/dL — ABNORMAL LOW (ref 12.0–15.0)
LYMPHS ABS: 1.7 10*3/uL (ref 0.7–4.0)
Lymphocytes Relative: 19 %
MCH: 25.6 pg — ABNORMAL LOW (ref 26.0–34.0)
MCHC: 31.9 g/dL (ref 30.0–36.0)
MCV: 80.3 fL (ref 78.0–100.0)
MONO ABS: 0.7 10*3/uL (ref 0.1–1.0)
Monocytes Relative: 8 %
Neutro Abs: 6.6 10*3/uL (ref 1.7–7.7)
Neutrophils Relative %: 71 %
Platelets: 291 10*3/uL (ref 150–400)
RBC: 3.51 MIL/uL — ABNORMAL LOW (ref 3.87–5.11)
RDW: 21.4 % — ABNORMAL HIGH (ref 11.5–15.5)
WBC: 9.2 10*3/uL (ref 4.0–10.5)

## 2018-02-27 LAB — GLUCOSE, CAPILLARY
GLUCOSE-CAPILLARY: 111 mg/dL — AB (ref 70–99)
GLUCOSE-CAPILLARY: 126 mg/dL — AB (ref 70–99)
GLUCOSE-CAPILLARY: 127 mg/dL — AB (ref 70–99)
Glucose-Capillary: 130 mg/dL — ABNORMAL HIGH (ref 70–99)
Glucose-Capillary: 113 mg/dL — ABNORMAL HIGH (ref 70–99)
Glucose-Capillary: 133 mg/dL — ABNORMAL HIGH (ref 70–99)

## 2018-02-27 LAB — PHOSPHORUS: PHOSPHORUS: 3.8 mg/dL (ref 2.5–4.6)

## 2018-02-27 LAB — MAGNESIUM: Magnesium: 1.8 mg/dL (ref 1.7–2.4)

## 2018-02-27 MED ORDER — ACETAMINOPHEN 10 MG/ML IV SOLN
1000.0000 mg | Freq: Four times a day (QID) | INTRAVENOUS | Status: DC
  Administered 2018-02-27 – 2018-02-28 (×4): 1000 mg via INTRAVENOUS
  Filled 2018-02-27 (×5): qty 100

## 2018-02-27 MED ORDER — TRACE MINERALS CR-CU-MN-SE-ZN 10-1000-500-60 MCG/ML IV SOLN
INTRAVENOUS | Status: AC
  Administered 2018-02-27: 17:00:00 via INTRAVENOUS
  Filled 2018-02-27 (×2): qty 1800

## 2018-02-27 MED ORDER — POTASSIUM CHLORIDE 10 MEQ/100ML IV SOLN
10.0000 meq | INTRAVENOUS | Status: AC
  Administered 2018-02-27 (×6): 10 meq via INTRAVENOUS
  Filled 2018-02-27 (×3): qty 100

## 2018-02-27 MED ORDER — FAT EMULSION PLANT BASED 20 % IV EMUL
240.0000 mL | INTRAVENOUS | Status: AC
  Administered 2018-02-27: 240 mL via INTRAVENOUS
  Filled 2018-02-27: qty 250

## 2018-02-27 MED ORDER — MAGNESIUM SULFATE 2 GM/50ML IV SOLN
2.0000 g | Freq: Once | INTRAVENOUS | Status: AC
  Administered 2018-02-27: 2 g via INTRAVENOUS
  Filled 2018-02-27: qty 50

## 2018-02-27 NOTE — Progress Notes (Signed)
[place] NOTE   Pharmacy Consult for TPN Indication: ileus  Patient Measurements: Height: 5\' 3"  (160 cm) Weight: 121 lb 0.5 oz (54.9 kg) IBW/kg (Calculated) : 52.4 TPN AdjBW (KG): 58.3 Body mass index is 21.44 kg/m.  Body mass index is 22.26 kg/m. Filed Weights   02/25/18 0658 02/26/18 0455 02/27/18 0500  Weight: 120 lb 2.4 oz (54.5 kg) 118 lb 9.7 oz (53.8 kg) 125 lb 10.6 oz (57 kg)    HPI: Pt is a 72 year old with ovarian cancer status post bilateral salpingo-oophorectomy and omentectomy with debulking on 8/22. Ex lap on 8/26 and transferred to ICU afterward. Remains critically ill on vent support, now s/p trach placement. Was previously on TPN 8/27-9/5, however has lately had issues tolerating enteral nutrition. TFs held since 9/17 d/t abdominal distention; CT abd shows persistent ileus. NG output remains high. [facility] resuming TPN per pharmacy on 9/19.  Significant events:  - 8/27 Start TPN. Hold lipids for first 7 days while pt in ICU  - 8/30 scheduled Novolog insulin added q4h - 9/2 CT scan of abdomenshowsundrained left pelvic fluid collection, IR consulted - defer drainage for now - 9/4 Lipids started with TPN. Tube feeds (Vital AF 1.2 Cal) initiated at 69mL/hr, order to increase by 57mL/hr every 8 hours to goal rate of 40mL/hr - 9/5 TPN weaned off - 9/19 resume TPN   Insulin requirements past 24 hours: 20 units resistant SSI / 24 hrs  (no hx DM) Current Nutrition: NPO, TF held  IVF: None  Central access: 02/01/18 TPN start date: 8/27-9/5, restart 9/19  ASSESSMENT                                                                                                          Today:   Glucose:  CBGs 113-159, more stable since rate to goal (increase in insulin since at goal, but lower AM requirements 9/22)  Electrolytes: (Goals K >4, Mg >2) K 3, Mag 1.8, phos 3.8, CorrCa 9.9  Lasix 40mg  IV daily  NG output ~ 252mL/d > increasing  over past 48h  Renal:  SCr 0.47, UOP 1.1 ml/kg/hr  LFTs:  WNL (9/20)  TGs:  90 (9/20)  Prealbumin: 3.5 (9/2), 10.6 (9/20)   NUTRITIONAL GOALS                                                                                             RD recs (9/17):  82-93g Protein, 1750-1925 Kcal per day Clinimix 5/15 at a goal rate of 75 ml/hr + 20% fat emulsion 240 ml/day to provide: 90 g of protein and 1758 kCals per day meeting 100% of protein and 100% of kCal  needs.  PLAN                                                                                                                         Now: Potassium 16meq IV x 6 runs Magnesium 2g IV x 1  At 1800 today:  Continue Clinimix E 5/15 at 75 ml/hr. (goal rate)  20% fat emulsion at 20 ml/hr x12 hours/day  TPN to contain standard multivitamins and trace element daily  TPN to contain Pepcid 40mg  IV daily  IVF per MD, currently none  Continue CBGs and SSI q4h (resistant)  TPN lab panels on Mondays & Thursdays   Bertis Ruddy, PharmD Clinical Pharmacist Please check AMION for all [place] numbers 02/27/2018 8:15 AM

## 2018-02-27 NOTE — Progress Notes (Signed)
Gynecologic Oncology Progress Note  72 y.o. female diagnosed with Stage IV ovarian cancer who received four cycles of neoadjuvant chemotherapy with carboplatin and taxol with fourth cycle completed on 12/20/17 .  She underwent interval debulking surgery with Dr. Skeet Latch on 01/27/18 consisting of an exploratory laparotomy infra-gastric omentectomy bilateral salpingo-oophorectomy radical optimal tumor debulking, resection of anterior abdominal wall.  Her post-operative course was complicated by findings to suggest bowel perforation on CT from 01/31/18 post-op with no site visualized on re-operation. It was felt that aspiration most likely occurred during intubation with the re-operation.          Subjective: Patient alert. Nods to questions. Smiles at times. Nursing state there is some gas in the rectal collection bag at times. Per nursing, but not family patient c/o abdominal discomfort and so they are giving her Fentanyl almost every 2 hours. The internist on this weekend placed a Fent patch yesterday ~2:45pm. Subjectively nursing feels she is requiring fewer boluses since the patch took effect, although she has still had several doses since 3am today. Patient is has no response when I ask her to point where the pain is in her abdomen. She is falling asleep between questions. Seems slightly more drowsy to me today, but does wake up with verbal and physical stimulus.  Objective: Vital signs in last 24 hours: Temp:  [98.4 F (36.9 C)-99.4 F (37.4 C)] 99.4 F (37.4 C) (09/22 1200) Pulse Rate:  [85-105] 94 (09/22 1445) Resp:  [9-27] 14 (09/22 1445) BP: (124-180)/(60-94) 176/82 (09/22 1445) SpO2:  [94 %-100 %] 99 % (09/22 1445) FiO2 (%):  [28 %] 28 % (09/22 1419) Weight:  [125 lb 10.6 oz (57 kg)] 125 lb 10.6 oz (57 kg) (09/22 0500) Last BM Date: (02/26/2018)  Intake/Output from previous day: 09/21 0701 - 09/22 0700 In: 3909.9 [I.V.:2335.6; IV Piggyback:1574.3] Out: 1800 [Urine:1450; Emesis/NG  output:200; Stool:150]  Physical Examination:  General: alert, appears comfortable, nodding head when asked questions  Cardio: RRR  Resp: Coarse breath sounds.  Rhonchi present.  Trach in place.  Abdo:   tight, distended (similar to slight improvement compared to Friday 9/20 assessment) now with distant tinkling on left and NABS on right.  Nontender, no rebound or guarding  NG suction on LIS with 200cc noted in cannister.   VAC in place with serosanguinous output.  LLQ JP with serous output   Extremities: SCD in place, no edema  Flexiseal in place with nothing in bag at this time.  Urine wick in place on suction with clear urine  Labs: WBC/Hgb/Hct/Plts:  9.2/9.0/28.2/291 (09/22 0455) BUN/Cr/glu/ALT/AST/amyl/lip:  22/0.47/--/--/--/--/-- (09/22 0455)     Assessment: 72 y.o. s/p Procedure(s): Interval debulking surgery with Dr. Skeet Latch on 01/27/18 consisting of an exploratory laparotomy infra-gastric omentectomy bilateral salpingo-oophorectomy radical optimal tumor debulking, resection of anterior abdominal wall.  Reoperation of EXPLORATORY LAPAROTOMY, ABDOMINAL WASHOUT on 01/31/18:   Neuro: had improvement in delirium but now slightly drowsier again. Fentanyl patch was placed yesterday and she's getting multiple doses of fentanyl. Will add on Tylenol IV for 24 hours to see if that cuts down on what they are having to give her. She is not in pain during my exam.  Pain: see neuro above  Heme:  Post-operative anemia: Hgb 8.9 and Hct 29.3 this am. Last transfusion 02/19/18.  Anemia related to acute critical illness, no evidence of active bleeding.   ID: Fevers resolved.   Urine for culture with no growth and stool for c diff negative from 02/17/18. Blood cultures  drawn 02/17/18 with no growth.  Presumed sepsis initially from gastrointestinal source but unable to identify the perforation after exp lap on 01/31/18. Contribution from pneumonia and possibly undrained left pelvic fluid  collection seen on CT scan 02/07/18; however the pelvic collection is improving. 02/13/18 enteric versus old hematoma appearing contents in JP. Cultures showing FEW WBC PRESENT, PREDOMINANTLY PMN MODERATE GRAM NEGATIVE RODS FEW GRAM POSITIVE RODS.   Followup CT's have shown decrease in pelvic fluid collection.  Granix received 02/01/18 per Dr. Alvy Bimler.  Vancomycin stopped on 02/04/18 and then fevers began again. Restarted Vancomycin on 02/06/18. Anidulafungin initiated 02/01/18.  Meropenum IV started 01/31/18 and discontinued on 02/16/18.  Unasyn started 02/16/18.  Blood cultures from 01/31/18 & 02/06/18 &  02/17/18 with no growth. Tracheal aspirate from 02/01/18 resulting few gram positive rods. Repeated 02/06/18 showed few yeast.  CV: Tachycardia managed per Hospitalist.   Resp: ARDS in setting of aspiration pneumonia and sepsis.   Per ICU/Pulm/Critical care  GI:  -CT AP on 01/31/18 with findings consistent with GI perforation.  No obvious perforation identified on re-operation on 01/31/18. -C diff negative.  - Flexiseal in place; nursing states with gas at times -TPN initially started 02/02/18 and discontinued 02/10/18 after tube feedings began. TPN restarted due to ileus 02/24/18 - Persistent abdominal distension- CT AP 9/18- ileus. TPN initiated 02/24/18. -02/13/18 -Enteric versus old hematoma contents in LLQ JP.  -JP fluid culture from 02/13/18:  FEW WBC PRESENT, PREDOMINANTLY PMN  MODERATE GRAM NEGATIVE RODS FEW GRAM POSITIVE RODS - CT pelvis 02/14/18: Stable to slight decrease in pelvic fluid collection, no evidence of contrast communication.  - Repeat CT AP 9/18- ileus  GU: AKI post-op resolved.  Purewick in place to suction with normal appearing urine in canister.   FEN: Replacement of K+ per Hospitalist /CC  Prophylaxis: SCDs in place. Continue Lovenox prophylaxis. No active bleeding.  ADL:  - PTx working with patient. Goal to chair by Monday - SLP working with patient on speech and presumably will help  with swallowing eval once we clear her GI tract for oral intake.  Plan: 1. Wound VAC to continue. Will ask team call us Monday at time of VAC change. I left order on chart and our office number 2. Continue TPN.  3. NG to suction and bowel rest until abd distension improves.  4. IV Tylenol x 24 hours; reorder in afternoon tomorrow if we want to continue. 5. Watch for delirium with the Fentanyl dosing. Family will discuss and decide if they want the patch off.  Isabel Caprice 02/27/2018, 4:08 PM

## 2018-02-27 NOTE — Progress Notes (Signed)
PROGRESS NOTE    Jo Parker  IOE:[id number] DOB: [birth year] DOA: 01/27/2018 PCP: [name], MD  Brief Narrative:72 year old African-American female with presumed diagnosis of ovarian adenocarcinoma felt stage IVb. She is completed 3 cycles of chemotherapy and is now status post bilateral salpingo-oophorectomy and omentectomy with debulking on 8/22.   8/26 transferred to ICU w/ concern for perforation w/ free air noted. Imipenem started. Went for exploratory laparotomy with washout. No significant contamination identified, presumed spontaneously closed microperf. Returned with abdominal drains postoperative chest x-ray showing evidence of aspiration pneumonia; mixed respiratory and metabolic acidosis &in septic shock. Required intubation.   8/27 GI medicine consulted, did not feel she had overt GIB. Rec's prophylaxis with PPI. Signed off. Weaning pressors. Hgb dropped from 8.8 to 7.4; got 2 units of blood, febrile, added. vancomycin. Neupogen given for neutropenia.   8/28-9/1 Shock resolved, TPN started. Fevers. Delirium barrier to weaning. ABX for fever  9/02-9/6 Delirium. Sedation difficult, diuresing, remains on abx. spiking temps, loculated fluid collection identified in left pelvis. Not amedable to drainage. ID consulted. Started seroquel. 9/4. Upper 2 drains removed. Staples removed.from incision site.  9/4-9/9 ID consulted by family. Fevers ongoing, CT abd with improved fluid collections. Failing PSV wean. No pressors. No sedation. Husband deferring decisions about care to son (newly married).   9/10 Sister at bedside - says she is not in favor of trach but says is the decision of son/dpoa. Son met with Dr Alvy Bimler for goals of care - current status precludes chemo but goal is trach and full code and full medical care and later chemo when ECOG improves. Low grade fever persists - ID following. More awake today. No Perf leak on CT abd and restarted  TF 9/12-9/14s/p Trach - Dr Nelda Marseille. Levophed post trach. Fevers. 1 unit PRBC,   9/14 - TRH consulted for medical management   9/18 - CT abdomen with ileus   9/19 TPN started    Assessment & Plan:   Principal Problem:   Pelvic abscess in female Active Problems:   Primary peritoneal carcinomatosis ([place])   Severe protein-calorie malnutrition ([place])   Severe sepsis ([place])   COPD (chronic obstructive pulmonary disease) ([name])   HLD (hyperlipidemia)   Absolute anemia   White coat syndrome with hypertension   Ovarian cancer ([place])   AKI (acute kidney injury) ([place])   Acute metabolic encephalopathy   Acute respiratory distress   Low urine output   Somnolence   At risk for fluid volume overload   Free intraperitoneal air   Acute on chronic respiratory failure ([name])   Pressure injury of skin   Status post bilateral salpingo-oophorectomy   Peritonitis ([place])   HCAP (healthcare-associated pneumonia)   Postoperative wound infection   Aspiration pneumonia ([name])   Wound dehiscence, surgical, initial encounter  Acute on chronic hypoxemic respiratory failure status post trach (9/12)- stable In setting of aspiration pneumonia/ARDS Right-sided pleural effusion, per PCCM no significant pocket to drain on bedside ultrasound. Patient tolerating ATC well during the day. Vent only at night. Continue pulmonary hygiene as able. Continue Lasix dose DECREASED TO 20 MG DAILY,ADJUST AS NEEDED.Continue trach management per pulmonary team.  Status post exploratory laparotomy for free air on 8/26; following bilateral salpingo-oophorectomy, omentectomy and debulking surgery on 8/22.S/p I&D from wound dehiscence 9/19 Continue management and wound care per OB/GYN/ONC  Abdominal pain/Ileus  CT abdomen shows persistent ileus.Keep patient n.p.o.Keep NG tube to low suction. Continue to repletepotassiumto keepabove 4 and magnesium above 2.On TPN started 02/24/2018.  KUB 02/26/2018 with  persistent  ileus.  Severe sepsis pelvic abscess, peritonitis - sepsis physiology resolved Initially with peritonitis (microperforation) subsequently complicated by pneumonia. Being followed by ID. Defer antibiotic therapy per ID and primary team.  HTN BP remains elevated despite schedule IV metoprolol. Will add vasotec 0.625 mg IV q6 hrs, continue hydralazine and labetalol PRN. Echo LVEF 65 to 70% with moderate LVH, grade 1 diastolic dysfunction. Patient also on daily IV Lasix.  Severe protein calorie malnutrition Holding tube feeding due to abdominal distention/pain, likely malabsorption Continue TPN per pharmacy, monitor electrolytes Postoperative anemia Status post 1 unit PRBCs,Hgb remains stable Transfuse if hemoglobin less than 7. Hypokalemia/hypomagnesemia Replete as needed, keep K > 4 and mag > 2  Acute metabolic encephalopathy - resolved In setting of critical illness and sedation. Celexa and [place]/cedas patient not able to tolerate p.o. meds at this time. If agitation or delirium will try IV Haldol.  Deconditioning  Patient will benefit from LTAC  Continue PT per ICU protocol     DVT prophylaxis: Lovenox Code Status: full Family Communication: dw elder son  Disposition Plan: Per GYN    Subjective: RESTING IN NAD OPENS EYES WITH LIGHT TOUCH, son by the bedside. Objective: Vitals:   02/27/18 0600 02/27/18 0700 02/27/18 0800 02/27/18 0820  BP: (!) 165/94 (!) 159/60    Pulse: 90 95 85 100  Resp: 15 (!) 27 (!) 26 (!) 9  Temp:   98.7 F (37.1 C)   TempSrc:   Oral   SpO2: 100% 100% 94% 100%  Weight:      Height:        Intake/Output Summary (Last 24 hours) at 02/27/2018 1041 Last data filed at 02/27/2018 0700 Gross per 24 hour  Intake 3313.84 ml  Output 1800 ml  Net 1513.84 ml   Filed Weights   02/25/18 0658 02/26/18 0455 02/27/18 0500  Weight: 54.5 kg 53.8 kg 57 kg    Examination:  General exam: Appears calm and comfortable  Respiratory  system: Clear to auscultation. Respiratory effort normal. Cardiovascular system: S1 & S2 heard, RRR. No JVD, murmurs, rubs, gallops or clicks. No pedal edema. Gastrointestinal system: Abdomen is  Distended hard  and tender. No organomegaly or masses felt. No bowel sounds heard. Extremities: Symmetric 5 x 5 power. Skin: No rashes, lesions or ulcers    Data Reviewed: I have personally reviewed following labs and imaging studies  CBC: Recent Labs  Lab 02/23/18 0645 02/24/18 0330 02/25/18 0455 02/26/18 0620 02/27/18 0455  WBC 11.0* 9.0 9.3 9.9 9.2  NEUTROABS 8.0* 6.3 6.8 7.3 6.6  HGB 10.0* 8.9* 8.9* 9.1* 9.0*  HCT 31.5* 28.1* 29.3* 28.8* 28.2*  MCV 80.8 81.2 82.3 80.7 80.3  PLT 379 359 347 329 709   Basic Metabolic Panel: Recent Labs  Lab 02/24/18 0330 02/24/18 1140 02/25/18 0455 02/26/18 0454 02/26/18 0620 02/27/18 0455  NA  --  148* 145 133* 144 140  K  --  2.4* 3.4* 4.7 2.8* 3.0*  CL  --  115* 105 95* 103 102  CO2  --  27 32 '29 30 30  '$ GLUCOSE  --  89 150* 855* 147* 140*  BUN  --  '11 14 18 20 22  '$ CREATININE  --  0.39* 0.48 0.56 0.44 0.47  CALCIUM  --  6.6* 8.7* 8.4* 8.6* 8.4*  MG 1.9  --  1.9 2.3 1.8 1.8  PHOS 3.8  --  3.6 6.7* 3.8 3.8   GFR: Estimated Creatinine Clearance: 52.6 mL/min (by C-G formula based on  SCr of 0.47 mg/dL). Liver Function Tests: Recent Labs  Lab 02/21/18 0323 02/22/18 0326 02/23/18 0645 02/25/18 0455  AST '27 29 26 19  '$ ALT '21 23 24 18  '$ ALKPHOS 89 84 91 79  BILITOT 0.3 0.6 0.6 0.8  PROT 6.5 6.8 7.6 7.5  ALBUMIN 1.6* 1.7* 1.9* 2.0*   No results for input(s): LIPASE, AMYLASE in the last 168 hours. No results for input(s): AMMONIA in the last 168 hours. Coagulation Profile: No results for input(s): INR, PROTIME in the last 168 hours. Cardiac Enzymes: No results for input(s): CKTOTAL, CKMB, CKMBINDEX, TROPONINI in the last 168 hours. BNP (last 3 results) No results for input(s): PROBNP in the last 8760 hours. HbA1C: No results for  input(s): HGBA1C in the last 72 hours. CBG: Recent Labs  Lab 02/26/18 1542 02/26/18 2052 02/26/18 2337 02/27/18 0502 02/27/18 0745  GLUCAP 159* 153* 133* 130* 113*   Lipid Profile: Recent Labs    02/25/18 0455  CHOL 119  HDL 28*  LDLCALC 73  TRIG 90  94  CHOLHDL 4.3   Thyroid Function Tests: No results for input(s): TSH, T4TOTAL, FREET4, T3FREE, THYROIDAB in the last 72 hours. Anemia Panel: No results for input(s): VITAMINB12, FOLATE, FERRITIN, TIBC, IRON, RETICCTPCT in the last 72 hours. Sepsis Labs: No results for input(s): PROCALCITON, LATICACIDVEN in the last 168 hours.  Recent Results (from the past 240 hour(s))  Culture, Urine     Status: None   Collection Time: 02/17/18 12:45 PM  Result Value Ref Range Status   Specimen Description   Final    URINE, CATHETERIZED Performed at [facility] [address] [place], [place] [address]    Special Requests   Final    Immunocompromised Performed at [facility], [place] [address] [place], [place] [address]    Culture   Final    NO GROWTH Performed at [facility], [place] [address], [place], [place] [address]    Report Status 02/18/2018 FINAL  Final  Culture, blood (routine x 2)     Status: None   Collection Time: 02/17/18  1:12 PM  Result Value Ref Range Status   Specimen Description   Final    BLOOD RIGHT ANTECUBITAL Performed at [facility] [address] [place], [place] [address]    Special Requests   Final    BOTTLES DRAWN AEROBIC ONLY Blood Culture adequate volume Performed at [place] [address], [place], [place] [address]    Culture   Final    NO GROWTH 5 DAYS Performed at [facility], [place] [address] [place], [place] [address]    Report Status 02/22/2018 FINAL  Final  Culture, blood (routine x 2)     Status: None   Collection Time: 02/17/18  1:14 PM  Result Value Ref Range Status   Specimen  Description   Final    BLOOD RIGHT ANTECUBITAL Performed at [facility] [address], [place], [place] [address]    Special Requests   Final    BOTTLES DRAWN AEROBIC ONLY Blood Culture adequate volume Performed at [place] [address] [place], [place] [address]    Culture   Final    NO GROWTH 5 DAYS Performed at [facility], [place] [address], [place], [place] [address]    Report Status 02/22/2018 FINAL  Final  C difficile quick scan w PCR reflex     Status: None  Collection Time: 02/17/18  3:08 PM  Result Value Ref Range Status   C Diff antigen NEGATIVE NEGATIVE Final   C Diff toxin NEGATIVE NEGATIVE Final   C Diff interpretation No C. difficile detected.  Final    Comment: Performed at [facility], [place] [address] [place],  [address]         Radiology Studies: Dg Abd 1 View  Result Date: 02/26/2018 CLINICAL DATA:  Abdominal distention. Recent oophorectomy and omentectomy EXAM: ABDOMEN - 1 VIEW COMPARISON:  02/25/2018 FINDINGS: NG tube remains in the stomach. Diffuse gaseous distention of bowel again noted compatible with ileus. Surgical drain in the pelvis. No free air or organomegaly. IMPRESSION: Continued diffuse gaseous distention of bowel compatible with ileus. No real change. Electronically Signed   By: Rolm Baptise M.D.   On: 02/26/2018 16:44   Dg Abd 1 View  Result Date: 02/25/2018 CLINICAL DATA:  Nasogastric tube placement. EXAM: ABDOMEN - 1 VIEW COMPARISON:  Abdominal radiograph February 25, 2018 at 0934 hours FINDINGS: Nasogastric tube tip projects in mid stomach, side port past GE junction. Multiple loops of gas filled small and large bowel, incompletely imaged. Soft tissue planes and included osseous structures are non suspicious. Lung bases obscured. IMPRESSION: Nasogastric tube tip projecting in mid stomach. Similar gas distended bowel, probable ileus. Electronically Signed   By:  Elon Alas M.D.   On: 02/25/2018 20:59        Scheduled Meds: . chlorhexidine gluconate (MEDLINE KIT)  15 mL Mouth Rinse BID  . enalaprilat  0.625 mg Intravenous Q6H  . enoxaparin (LOVENOX) injection  40 mg Subcutaneous Q24H  . fentaNYL  25 mcg Transdermal Q72H  . furosemide  20 mg Intravenous Daily  . insulin aspart  0-20 Units Subcutaneous Q4H  . ipratropium-albuterol  3 mL Nebulization TID  . mouth rinse  15 mL Mouth Rinse 10 times per day  . metoprolol tartrate  5 mg Intravenous Q6H  . sodium chloride flush  10-40 mL Intracatheter Q12H   Continuous Infusions: . sodium chloride 250 mL (02/27/18 1027)  . sodium chloride Stopped (02/26/18 1242)  . ampicillin-sulbactam (UNASYN) IV Stopped (02/27/18 0548)  . anidulafungin 100 mg (02/27/18 0933)  . Marland KitchenTPN (CLINIMIX-E) Adult     And  . Fat emulsion    . magnesium sulfate 1 - 4 g bolus IVPB 2 g (02/27/18 1030)  . potassium chloride 10 mEq (02/27/18 1029)  . Marland KitchenTPN (CLINIMIX-E) Adult 75 mL/hr at 02/27/18 0600  . vancomycin Stopped (02/26/18 1543)     LOS: 31 days     Georgette Shell, MD [facility]  If 7PM-7AM, please contact night-coverage [email or web address] Password [facility] 02/27/2018, 10:41 AM

## 2018-02-28 ENCOUNTER — Inpatient Hospital Stay (HOSPITAL_COMMUNITY): Payer: Medicare PPO

## 2018-02-28 ENCOUNTER — Telehealth: Payer: Self-pay | Admitting: *Deleted

## 2018-02-28 DIAGNOSIS — J9621 Acute and chronic respiratory failure with hypoxia: Secondary | ICD-10-CM

## 2018-02-28 LAB — CBC WITH DIFFERENTIAL/PLATELET
Basophils Absolute: 0 10*3/uL (ref 0.0–0.1)
Basophils Relative: 0 %
EOS PCT: 2 %
Eosinophils Absolute: 0.2 10*3/uL (ref 0.0–0.7)
HEMATOCRIT: 27.2 % — AB (ref 36.0–46.0)
Hemoglobin: 8.8 g/dL — ABNORMAL LOW (ref 12.0–15.0)
LYMPHS ABS: 1.8 10*3/uL (ref 0.7–4.0)
Lymphocytes Relative: 19 %
MCH: 25.4 pg — ABNORMAL LOW (ref 26.0–34.0)
MCHC: 32.4 g/dL (ref 30.0–36.0)
MCV: 78.6 fL (ref 78.0–100.0)
MONO ABS: 0.6 10*3/uL (ref 0.1–1.0)
MONOS PCT: 6 %
Neutro Abs: 6.7 10*3/uL (ref 1.7–7.7)
Neutrophils Relative %: 73 %
PLATELETS: 282 10*3/uL (ref 150–400)
RBC: 3.46 MIL/uL — AB (ref 3.87–5.11)
RDW: 20.6 % — AB (ref 11.5–15.5)
WBC: 9.3 10*3/uL (ref 4.0–10.5)

## 2018-02-28 LAB — PHOSPHORUS: PHOSPHORUS: 3.3 mg/dL (ref 2.5–4.6)

## 2018-02-28 LAB — PREALBUMIN: PREALBUMIN: 8.7 mg/dL — AB (ref 18–38)

## 2018-02-28 LAB — COMPREHENSIVE METABOLIC PANEL
ALT: 14 U/L (ref 0–44)
AST: 21 U/L (ref 15–41)
Albumin: 1.7 g/dL — ABNORMAL LOW (ref 3.5–5.0)
Alkaline Phosphatase: 94 U/L (ref 38–126)
Anion gap: 8 (ref 5–15)
BILIRUBIN TOTAL: 0.2 mg/dL — AB (ref 0.3–1.2)
BUN: 16 mg/dL (ref 8–23)
CHLORIDE: 99 mmol/L (ref 98–111)
CO2: 29 mmol/L (ref 22–32)
Calcium: 8.1 mg/dL — ABNORMAL LOW (ref 8.9–10.3)
Creatinine, Ser: 0.39 mg/dL — ABNORMAL LOW (ref 0.44–1.00)
GFR calc Af Amer: >60 mL/min (ref >60)
Glucose, Bld: 121 mg/dL — ABNORMAL HIGH (ref 70–99)
POTASSIUM: 3.1 mmol/L — AB (ref 3.5–5.1)
Sodium: 136 mmol/L (ref 135–145)
TOTAL PROTEIN: 6.4 g/dL — AB (ref 6.5–8.1)

## 2018-02-28 LAB — MAGNESIUM: Magnesium: 1.7 mg/dL (ref 1.7–2.4)

## 2018-02-28 LAB — GLUCOSE, CAPILLARY
GLUCOSE-CAPILLARY: 102 mg/dL — AB (ref 70–99)
GLUCOSE-CAPILLARY: 135 mg/dL — AB (ref 70–99)
GLUCOSE-CAPILLARY: 124 mg/dL — AB (ref 70–99)
GLUCOSE-CAPILLARY: 125 mg/dL — AB (ref 70–99)
Glucose-Capillary: >600 mg/dL (ref 70–99)
Glucose-Capillary: 127 mg/dL — ABNORMAL HIGH (ref 70–99)
Glucose-Capillary: 97 mg/dL (ref 70–99)
Glucose-Capillary: 137 mg/dL — ABNORMAL HIGH (ref 70–99)

## 2018-02-28 LAB — TRIGLYCERIDES: TRIGLYCERIDES: 108 mg/dL (ref <150)

## 2018-02-28 MED ORDER — TRAVASOL 10 % IV SOLN
INTRAVENOUS | Status: AC
  Administered 2018-02-28: 18:00:00 via INTRAVENOUS
  Filled 2018-02-28: qty 900

## 2018-02-28 MED ORDER — POTASSIUM CHLORIDE 10 MEQ/50ML IV SOLN
10.0000 meq | INTRAVENOUS | Status: AC
  Administered 2018-02-28 (×6): 10 meq via INTRAVENOUS
  Filled 2018-02-28 (×5): qty 50

## 2018-02-28 MED ORDER — ACETAMINOPHEN 10 MG/ML IV SOLN: 1000.0000 mg | Freq: Four times a day (QID) | INTRAVENOUS | Status: DC

## 2018-02-28 MED ORDER — HALOPERIDOL LACTATE 5 MG/ML IJ SOLN
2.5000 mg | Freq: Once | INTRAMUSCULAR | Status: AC
  Administered 2018-02-28: 2.5 mg via INTRAVENOUS
  Filled 2018-02-28: qty 1

## 2018-02-28 MED ORDER — MAGNESIUM SULFATE 2 GM/50ML IV SOLN
2.0000 g | Freq: Once | INTRAVENOUS | Status: AC
  Administered 2018-02-28: 2 g via INTRAVENOUS
  Filled 2018-02-28: qty 50

## 2018-02-28 MED ORDER — METOPROLOL TARTRATE 5 MG/5ML IV SOLN
10.0000 mg | Freq: Four times a day (QID) | INTRAVENOUS | Status: DC
  Administered 2018-02-28 – 2018-03-04 (×16): 10 mg via INTRAVENOUS
  Filled 2018-02-28 (×17): qty 10

## 2018-02-28 MED ORDER — POTASSIUM CHLORIDE 10 MEQ/50ML IV SOLN
INTRAVENOUS | Status: AC
  Administered 2018-02-28: 10 meq via INTRAVENOUS
  Filled 2018-02-28: qty 50

## 2018-02-28 MED ORDER — HALOPERIDOL LACTATE 5 MG/ML IJ SOLN
1.0000 mg | Freq: Once | INTRAMUSCULAR | Status: AC
  Administered 2018-02-28: 1 mg via INTRAVENOUS
  Filled 2018-02-28: qty 1

## 2018-02-28 MED ORDER — ENALAPRILAT 1.25 MG/ML IV SOLN
1.2500 mg | Freq: Four times a day (QID) | INTRAVENOUS | Status: DC
  Administered 2018-02-28 – 2018-03-04 (×15): 1.25 mg via INTRAVENOUS
  Filled 2018-02-28 (×15): qty 1

## 2018-02-28 MED ORDER — CHLORHEXIDINE GLUCONATE 0.12 % MT SOLN
15.0000 mL | Freq: Two times a day (BID) | OROMUCOSAL | Status: DC
  Administered 2018-02-28 – 2018-03-04 (×8): 15 mL via OROMUCOSAL
  Filled 2018-02-28 (×8): qty 15

## 2018-02-28 MED ORDER — FENTANYL CITRATE (PF) 100 MCG/2ML IJ SOLN
25.0000 ug | INTRAMUSCULAR | Status: DC | PRN
  Administered 2018-02-28 – 2018-03-01 (×6): 25 ug via INTRAVENOUS
  Filled 2018-02-28 (×6): qty 2

## 2018-02-28 MED ORDER — ORAL CARE MOUTH RINSE
15.0000 mL | Freq: Two times a day (BID) | OROMUCOSAL | Status: DC
  Administered 2018-02-28 – 2018-03-02 (×4): 15 mL via OROMUCOSAL

## 2018-02-28 MED ORDER — ACETAMINOPHEN 10 MG/ML IV SOLN
1000.0000 mg | Freq: Four times a day (QID) | INTRAVENOUS | Status: AC
  Administered 2018-02-28 – 2018-03-01 (×3): 1000 mg via INTRAVENOUS
  Filled 2018-02-28 (×3): qty 100

## 2018-02-28 NOTE — Consult Note (Signed)
[place] Nurse wound consult note Reason for Consult: GYN surgical team following for assessment and plan of care for abd wound; at the bedside to assess wound appearance during Vac dressing change.  Wound type: Midline abd with full thickness post-op wound Wound bed: 90% beefy red, 10% yellow.  Some darker-colored edges to wound related to previously used Silver Nitrate, according to progress notes Drainage (amount, consistency, odor) Mod amt pink drainage in the cannister, no odor Periwound: intact skin surrounding the wound Dressing procedure/placement/frequency: Pt was medicated for pain prior to the procedure and tolerated with mod amt discomfort.  Applied one piece black foam to 171mm cont suction.   Plan for bedside nurses to change dressing Q M/W/F Please re-consult if further assistance is needed.  Thank-you,  Julien Girt MSN, [place], [place], [place], [place]

## 2018-02-28 NOTE — Progress Notes (Signed)
Pt confused, constantly attempting to climb out of bed and pull out NG tube. Mittens placed on hand. Son voices concern about patients confusion, states that this is how she was behaving when she initially transferred to ICU. States that she was not as confused on dayshift. hospitalist paged and CCM notified. Discussed the possibility of sundowning and hospital-induced delirium. 2.5 mg of haldol IV rdered and administered. Will continue to monitor patient closely at this time.

## 2018-02-28 NOTE — Progress Notes (Signed)
Nutrition Follow-up  DOCUMENTATION CODES:   Not applicable  INTERVENTION:  - TF re-start as medically feasible. - Continue TPN per Pharmacy.    NUTRITION DIAGNOSIS:   Increased nutrient needs related to post-op healing as evidenced by estimated needs. -ongoing  GOAL:   Patient will meet greater than or equal to 90% of their needs -met with TPN regimen  MONITOR:   Weight trends, Labs, Skin, I & O's, Other (Comment)(TF re-start)  ASSESSMENT:   Patient with PMH significant for COPD< HLD, HTN, mitral valve prolapse, and Stage IV ovarian cancer s/p neoadjuvant chemotherapy . Presents this admission for robotic assisted bilateral salpingo-oophorectomy omentectomy interval debulking.   Significant Events: 8/22:ex lap with infra-gastric omentectomy bilateral salpingo-oophorectomy radical optimal tumor debulking, resection of anterior abdominal wall 8/24:remained somnolent and was confused, noted to have post-op UTI 8/26:remained confused and having chest pain; transferred to SDU for Cardene drip; x-ray showed free air and CT abd concerning for ischemic bowel, bowel pneumatosis, and large amount of pneumoperitoneum; repeat ex lap with washout. 8/27:triple lumen PICC placed in L brachial 8/28:TPN initiated; ILE hold x7 days; received Granix per Oncology. 9/4:trickle TF started via NGT. 9/5:TPN weaned off. 9/6:TF rate advanced to 50 mL/hr(goal rate). 9/9:TF placed on hold d/t concern for microperf 9/10:TF restarted at 50 mL/hr. 9/12:tracheostomy placed. 9/16: vomited and NGT dislodged and subsequently re-advanced; TF placed on hold; trach collar during the day and vent support at night.  9/18: CT abdomen indicates ileus. 9/19: TPN initiated, ILE on hold x7 days per ICU protocol; I&D d/t abdominal wound dehiscence. 9/20: NGT replaced 9/22: entirely off of vent support    Weight has been mainly stable since 9/19. Patient now entirely off of vent support and on trach  collar 24 hours/day. She has a triple lumen PICC and is receiving TPN at goal rate: Clinimix E 5/15 @ 75 ml/hr with 20% ILE @ 20 mL/hr x12 hours. This regimen is providing 1758 kcal and 90 grams of protein.   NGT to LIS with 400 mL output overnight. SLP has been working with patient with PMV. Dr. Elza Rafter note from this AM states that patient would benefit from [facility].    Medications reviewed; 20 mg IV Lasix/day, sliding scale Novolog, 2 g IV Mg sulfate x1 run today, 10 mEq IV KCl x6 runs yesterday and x6 runs today.  Labs reviewed; CBGs: 127 and 102 mg/dL today, K: 3.1 mmol/L, Ca: 8.1 mg/dL, creatinine: 0.39 mg/dL.     Diet Order:   Diet Order    None      EDUCATION NEEDS:   Education needs have been addressed  Skin:  Skin Assessment: Skin Integrity Issues: Skin Integrity Issues:: Unstageable Unstageable: full thickness to R ear Incisions: abdominal (8/26)  Last BM:  9/22  Height:   Ht Readings from Last 1 Encounters:  01/31/18 _0  (1.6 m)    Weight:   Wt Readings from Last 1 Encounters:  02/28/18 54.6 kg    Ideal Body Weight:  52.3 kg  BMI:  Body mass index is 21.32 kg/m.  Estimated Nutritional Needs:   Kcal:  1750-1925 (30-33 kcal/kg)  Protein:  82-93 grams (1.4-1.6 grams/kg)  Fluid:  >/= 1.5 L/day      Jarome Matin, MS, RD, LDN, [facility] Inpatient Clinical Dietitian Pager # [phone] After hours/weekend pager # [phone]

## 2018-02-28 NOTE — Progress Notes (Signed)
Gynecologic Oncology Progress Note  72 y.o. female diagnosed with Stage IV ovarian cancer who received four cycles of neoadjuvant chemotherapy with carboplatin and taxol with fourth cycle completed on 12/20/17 .  She underwent interval debulking surgery with Dr. Skeet Latch on 01/27/18 consisting of an exploratory laparotomy infra-gastric omentectomy bilateral salpingo-oophorectomy radical optimal tumor debulking, resection of anterior abdominal wall.  Her post-operative course was complicated by findings to suggest bowel perforation on CT from 01/31/18 post-op with no site visualized on re-operation. It was felt that aspiration most likely occurred during intubation with the re-operation.          Subjective: Patient alert, restless and turning about in bed.  Son and RN at the bedside then husband arrived after a few minutes later.  Patient nodding yes to feeling agitated.  Making multiple attempts to cough up sputum. Denies being in pain.  WO RN here to change abdominal dressing.  Objective: Vital signs in last 24 hours: Temp:  [98.5 F (36.9 C)-99.4 F (37.4 C)] 98.5 F (36.9 C) (09/22 2349) Pulse Rate:  [83-106] 88 (09/23 0600) Resp:  [10-28] 21 (09/23 0600) BP: (122-186)/(61-86) 161/86 (09/23 0600) SpO2:  [96 %-100 %] 98 % (09/23 0600) FiO2 (%):  [28 %] 28 % (09/23 0315) Weight:  [120 lb 5.9 oz (54.6 kg)] 120 lb 5.9 oz (54.6 kg) (09/23 0500) Last BM Date: 02/27/18  Intake/Output from previous day: 09/22 0701 - 09/23 0700 In: 3434.7 [I.V.:1886.4; IV Piggyback:1548.3] Out: 1405 [Urine:975; Emesis/NG output:200; Drains:30; Stool:200]  Physical Examination:  General: alert, appears agitated/restless, nodding head when asked questions  Cardio: Tachycardic at 111 bpm  Resp: Coarse breath sounds.  Rhonchi present.  Trach in place.  Abdomen:   tight, distended (slight improvement compared to Friday 9/20 assessment). Tympanic on percussion. Nontender, no rebound or guarding. Active bowel  sounds in the right abdomen with tinkling noted on the left   NG suction on LIS with 25cc noted in cannister of yellowish output.   Wound VAC removed by WO RN. Wound bed beefy red with granulation tissue present.  Noted skin changes around edge of incision from silver nitrate use. See media for picture.    LLQ JP with minimal serous output   Extremities: SCD in place, no edema  Flexiseal in place with 100 cc output marked from yesterday.  Urine wick in place on suction with clear urine in canister (around 250 cc)  Labs: WBC/Hgb/Hct/Plts:  9.3/8.8/27.2/282 (09/23 0459) BUN/Cr/glu/ALT/AST/amyl/lip:  16/0.39/--/14/21/--/-- (09/23 0459)    Assessment: 72 y.o. s/p Procedure(s): Interval debulking surgery with Dr. Skeet Latch on 01/27/18 consisting of an exploratory laparotomy infra-gastric omentectomy bilateral salpingo-oophorectomy radical optimal tumor debulking, resection of anterior abdominal wall.  Reoperation of EXPLORATORY LAPAROTOMY, ABDOMINAL WASHOUT on 01/31/18:   Neuro: Alert, agitated, fentanyl patch found off pt in bed by RN  Pain: Fentanyl patch ordered but found in bed not on patient.  Fentanyl IV ordered for breakthrough.  Plan to continue IV Tylenol.    Heme: Post-operative anemia: Hgb 8.8 and Hct 27.2 this am. Last transfusion 02/19/18.  Anemia related to acute critical illness, no evidence of active bleeding.   ID: Fevers resolved.  Urine for culture with no growth and stool for c diff negative from 02/17/18. Blood cultures drawn 02/17/18 with no growth.  Presumed sepsis initially from gastrointestinal source but unable to identify the perforation after exp lap on 01/31/18. Contribution from pneumonia and possibly undrained left pelvic fluid collection seen on CT scan 02/07/18; however the pelvic collection is improving.  Followup CT's have shown decrease in pelvic fluid collection.  Granix received 02/01/18 per Dr. Alvy Bimler.  Vancomycin stopped on 02/04/18 and then fevers began  again. Restarted Vancomycin on 02/06/18. Anidulafungin initiated 02/01/18.  Meropenum IV started 01/31/18 and discontinued on 02/16/18.  Unasyn started 02/16/18.  Blood cultures from 01/31/18 & 02/06/18 &  02/17/18 with no growth. Tracheal aspirate from 02/01/18 resulting few gram positive rods. Repeated 02/06/18 showed few yeast.  CV: Tachycardia and hypertension managed per Hospitalist.   Resp: ARDS in setting of aspiration pneumonia and sepsis.  Trach in place. Per ICU/Pulm/Critical care.  GI: Abd 1 view this am:  Changes consistent with ileus although mildly improved when compared with the prior exam. -CT AP on 01/31/18 with findings consistent with GI perforation.  No obvious perforation identified on re-operation on 01/31/18. -C diff negative.  - Flexiseal in place; nursing states with gas at times -TPN initially started 02/02/18 and discontinued 02/10/18 after tube feedings began. TPN restarted due to ileus 02/24/18 - Persistent abdominal distension- CT AP 9/18- ileus.  -02/13/18 -Enteric versus old hematoma contents in LLQ JP.  -JP fluid culture from 02/13/18:  FEW WBC PRESENT, PREDOMINANTLY PMN  MODERATE GRAM NEGATIVE RODS FEW GRAM POSITIVE RODS - CT pelvis 02/14/18: Stable to slight decrease in pelvic fluid collection, no evidence of contrast communication.   GU: AKI post-op resolved. Creatinine 0.39 this am.  Purewick in place to suction with normal appearing urine in canister.   FEN: Replacement of K+ per Hospitalist Amber- K+ 3.1 this am  Prophylaxis: SCDs in place. Continue Lovenox prophylaxis. No active bleeding.  ADL:  - PTx working with patient. Goal to chair by Monday - SLP working with patient on speech and presumably will help with swallowing eval once we clear her GI tract for oral intake.  Plan: 1. Wound VAC to continue.  2. Continue TPN.  3. NG to suction and bowel rest until abd distension improves.  4. IV Tylenol.  Plan to reorder. 5.   Continue to monitor for delirium with the Fentanyl  dosing. 6.   Continue plan per Hospitalist/CC/ID/Dr. Luna Fuse D Alexandrina Fiorini 02/28/2018, 8:32 AM

## 2018-02-28 NOTE — Progress Notes (Addendum)
CONSULT PROGRESS NOTE [facility]   Rhegan Trunnell   OLM:[id number] DOB: 02-19-1946  DOA: 01/27/2018 PCP: [name], MD   Brief Narrative:  Jo Parker 72 year old African-American female with presumed diagnosis of ovarian adenocarcinoma felt stage IVb.  She is completed 3 cycles of chemotherapy and is now status post bilateral salpingo-oophorectomy and omentectomy with debulking on 8/22.   8/26 transferred to ICU w/ concern for perforation w/ free air noted.  Imipenem started. Went for exploratory laparotomy with washout.  No significant contamination identified, presumed spontaneously closed microperf. Returned with abdominal drains postoperative chest x-ray showing evidence of aspiration pneumonia; mixed respiratory and metabolic acidosis & in septic shock. Required intubation.   8/27 GI medicine consulted, did not feel she had overt GIB.  Rec's prophylaxis with PPI.  Signed off.  Weaning pressors. Hgb dropped from 8.8 to 7.4; got 2 units of blood,  febrile, added. vancomycin.  Neupogen given for neutropenia.   8/28-9/1 Shock resolved, TPN started. Fevers. Delirium barrier to weaning.  ABX for fever  9/02-9/6 Delirium. Sedation difficult, diuresing, remains on abx. spiking temps, loculated fluid collection identified in left pelvis. Not amedable to drainage.  ID consulted. Started seroquel. 9/4. Upper 2 drains removed. Staples removed.from incision site.  9/4-9/9 ID consulted by family.  Fevers ongoing, CT abd with improved fluid collections.  Failing PSV wean.  No pressors.  No sedation.   Husband deferring decisions about care to son (newly married).   9/10  Sister at bedside - says she is not in favor of trach but says is the decision of son/dpoa. Son met with Dr Alvy Bimler for goals of care - current status precludes chemo but goal is trach and full code and full medical care and later chemo when ECOG improves.  Low grade fever persists - ID following. More awake today.  No Perf leak on CT abd and restarted TF  9/12-9/14  s/p Trach - Dr Nelda Marseille. Levophed post trach.  Fevers. 1 unit PRBC,   9/14 - TR[facility] consulted for medical management   9/18 - CT abdomen with ileus   9/22 - Off Vent completely, tolerating well ATC   Subjective: Patient seen and examined, she is somewhat agitated this AM, her wound VAC was recently changed and she received 50 mcg of IV fentanyl. She denies pain, want to get out of bed. Son and husband at bedside.   Assessment & Plan:   Principal Problem:   Pelvic abscess in female Active Problems:   Primary peritoneal carcinomatosis ([place])   Severe protein-calorie malnutrition ([place])   Severe sepsis ([name])   COPD (chronic obstructive pulmonary disease) ([name])   HLD (hyperlipidemia)   Absolute anemia   White coat syndrome with hypertension   Ovarian cancer ([place])   AKI (acute kidney injury) ([place])   Acute metabolic encephalopathy   Acute respiratory distress   Low urine output   Somnolence   At risk for fluid volume overload   Free intraperitoneal air   Acute on chronic respiratory failure ([name])   Pressure injury of skin   Status post bilateral salpingo-oophorectomy   Peritonitis ([place])   HCAP (healthcare-associated pneumonia)   Postoperative wound infection   Aspiration pneumonia ([name])   Wound dehiscence, surgical, initial encounter  Acute on chronic hypoxemic respiratory failure status post trach (9/12) - stable  In setting of aspiration pneumonia/ARDS. Right-sided pleural effusion, per PCCM no significant pocket to drain on bedside ultrasound. Fully off vent, tolerating ATC well. Continue pulmonary hygiene as able. Continue trach management per  pulmonary team. Trial of PMV as tolerated.   Status post exploratory laparotomy for free air on 8/26; following bilateral salpingo-oophorectomy, omentectomy and debulking surgery on 8/22. S/p I&D from wound dehiscence 9/19   Management per GYN/ONC  Pain control, will decrease fentanyl  PRN dose to 25 mcg, continue fentanyl patch, agree with IV tylenol.   Abdominal pain/Ileus  Abdominal xray this AM showing persistent ileus however, some improvement from prior. Continue NGT to low intermittent suction. Keep NPO. Keep K > 4 and Mag > 2   Severe sepsis pelvic abscess, peritonitis - sepsis physiology resolved Initially with peritonitis (microperforation) subsequently complicated by pneumonia. Been treated with Unasyn, Eraxis and Vancomycin. Continue management per ID recommendations.   HTN  BP improved but remains above goal, will increase IV metoprolol to 10 mg and vasotec to 1.25 mg. Continue hydralazine and labetalol PRN.  Echo LVEF 65 to 70% with moderate LVH, grade 1 diastolic dysfunction. Patient also on Lasix 20 mg IV daily.   Severe protein calorie malnutrition Continue to hold tube feedings due to ileus, likely malabsorption  Continue TPN and monitor electrolytes   Postoperative anemia Status post 4 unit PRBCs during hospital stay, Hgb stable  Transfuse if hemoglobin less than 7.  Hypokalemia/hypomagnesemia Replete as needed, keep K > 4 and mag > 2  Acute metabolic encephalopathy - resolved In setting of critical illness and sedation. Celexa and Seroquel d/ced as patient not able to tolerate p.o. meds at this time.  Patient agitated this AM will try 1 mg of IV haldol  Deconditioning  Patient will benefit from LTAC  Continue PT per ICU protocol   Code Status: Full Code  Family Communication: Husband at bedside    Procedures:     Antimicrobials: Anti-infectives (From admission, onward)   Start     Dose/Rate Route Frequency Ordered Stop   02/22/18 1400  vancomycin (VANCOCIN) IVPB 750 mg/150 ml premix  Status:  Discontinued     750 mg 150 mL/hr over 60 Minutes Intravenous Every 24 hours 02/22/18 1335 02/22/18 1337   02/22/18 1400  vancomycin (VANCOCIN) IVPB 1000 mg/200 mL premix     1,000 mg 200 mL/hr over 60 Minutes Intravenous Every 24 hours  02/22/18 1337     02/22/18 1200  vancomycin (VANCOCIN) IVPB 750 mg/150 ml premix  Status:  Discontinued     750 mg 150 mL/hr over 60 Minutes Intravenous Every 24 hours 02/22/18 0903 02/22/18 1243   02/17/18 1200  vancomycin (VANCOCIN) IVPB 750 mg/150 ml premix  Status:  Discontinued     750 mg 150 mL/hr over 60 Minutes Intravenous Every 24 hours 02/16/18 1518 02/22/18 0521   02/16/18 1800  Ampicillin-Sulbactam (UNASYN) 3 g in sodium chloride 0.9 % 100 mL IVPB     3 g 200 mL/hr over 30 Minutes Intravenous Every 6 hours 02/16/18 1619     02/07/18 1200  vancomycin (VANCOCIN) IVPB 1000 mg/200 mL premix  Status:  Discontinued     1,000 mg 200 mL/hr over 60 Minutes Intravenous Every 24 hours 02/06/18 1315 02/16/18 1518   02/06/18 1015  vancomycin (VANCOCIN) IVPB 1000 mg/200 mL premix     1,000 mg 200 mL/hr over 60 Minutes Intravenous STAT 02/06/18 1012 02/06/18 1211   02/02/18 1000  anidulafungin (ERAXIS) 100 mg in sodium chloride 0.9 % 100 mL IVPB     100 mg 78 mL/hr over 100 Minutes Intravenous Every 24 hours 02/01/18 0916     02/02/18 1000  vancomycin (VANCOCIN) IVPB 1000 mg/200 mL  premix  Status:  Discontinued     1,000 mg 200 mL/hr over 60 Minutes Intravenous Every 24 hours 02/01/18 1039 02/04/18 0852   02/01/18 1000  anidulafungin (ERAXIS) 200 mg in sodium chloride 0.9 % 200 mL IVPB     200 mg 78 mL/hr over 200 Minutes Intravenous  Once 02/01/18 0916 02/01/18 1602   02/01/18 0830  vancomycin (VANCOCIN) 1,250 mg in sodium chloride 0.9 % 250 mL IVPB     1,250 mg 166.7 mL/hr over 90 Minutes Intravenous STAT 02/01/18 0822 02/01/18 1035   01/31/18 1800  meropenem (MERREM) 1 g in sodium chloride 0.9 % 100 mL IVPB  Status:  Discontinued     1 g 200 mL/hr over 30 Minutes Intravenous Every 8 hours 01/31/18 1012 02/16/18 1619   01/31/18 0900  meropenem (MERREM) 1 g in sodium chloride 0.9 % 100 mL IVPB     1 g 200 mL/hr over 30 Minutes Intravenous STAT 01/31/18 0852 01/31/18 1005   01/27/18  0600  cefOXitin (MEFOXIN) 2 g in sodium chloride 0.9 % 100 mL IVPB     2 g 200 mL/hr over 30 Minutes Intravenous On call to O.R. 01/27/18 0533 01/27/18 0753       Objective: Vitals:   02/28/18 0600 02/28/18 0842 02/28/18 0847 02/28/18 0906  BP: (!) 161/86  (!) 225/99   Pulse: 88   (!) 119  Resp: (!) 21   (!) 23  Temp:  98.2 F (36.8 C)    TempSrc:  Oral    SpO2: 98%   98%  Weight:      Height:        Intake/Output Summary (Last 24 hours) at 02/28/2018 0908 Last data filed at 02/28/2018 0400 Gross per 24 hour  Intake 3434.67 ml  Output 1405 ml  Net 2029.67 ml   Filed Weights   02/26/18 0455 02/27/18 0500 02/28/18 0500  Weight: 53.8 kg 57 kg 54.6 kg    Examination:   General: Pt is alert, awake, not in acute distress HEENT: NG tube with minimal output, Trach in place, clean  Cardiovascular: RRR, S1/S2 +, no rubs, no gallops Respiratory: Air entry improved, diffuse rhonchi  Abdominal: Abdomen soft, diffuse tenderness, wound vac midline, JP in place  Extremities: no edema Neuro: Oriented to place and self, restless  Skin: Warm and dry    Data Reviewed: I have personally reviewed following labs and imaging studies  CBC: Recent Labs  Lab 02/24/18 0330 02/25/18 0455 02/26/18 0620 02/27/18 0455 02/28/18 0459  WBC 9.0 9.3 9.9 9.2 9.3  NEUTROABS 6.3 6.8 7.3 6.6 6.7  HGB 8.9* 8.9* 9.1* 9.0* 8.8*  HCT 28.1* 29.3* 28.8* 28.2* 27.2*  MCV 81.2 82.3 80.7 80.3 78.6  PLT 359 347 329 291 062   Basic Metabolic Panel: Recent Labs  Lab 02/25/18 0455 02/26/18 0454 02/26/18 0620 02/27/18 0455 02/28/18 0459  NA 145 133* 144 140 136  K 3.4* 4.7 2.8* 3.0* 3.1*  CL 105 95* 103 102 99  CO2 32 _0 GLUCOSE 150* 855* 147* 140* 121*  BUN _1 CREATININE 0.48 0.56 0.44 0.47 0.39*  CALCIUM 8.7* 8.4* 8.6* 8.4* 8.1*  MG 1.9 2.3 1.8 1.8 1.7  PHOS 3.6 6.7* 3.8 3.8 3.3   GFR: Estimated Creatinine Clearance: 52.6 mL/min (A) (by C-G formula based on SCr of  0.39 mg/dL (L)). Liver Function Tests: Recent Labs  Lab 02/22/18 0326 02/23/18 0645 02/25/18 0455 02/28/18 0459  AST 29 26  19 21  ALT _0 ALKPHOS 84 91 79 94  BILITOT 0.6 0.6 0.8 0.2*  PROT 6.8 7.6 7.5 6.4*  ALBUMIN 1.7* 1.9* 2.0* 1.7*   No results for input(s): LIPASE, AMYLASE in the last 168 hours. No results for input(s): AMMONIA in the last 168 hours. Coagulation Profile: No results for input(s): INR, PROTIME in the last 168 hours. Cardiac Enzymes: No results for input(s): CKTOTAL, CKMB, CKMBINDEX, TROPONINI in the last 168 hours. BNP (last 3 results) No results for input(s): PROBNP in the last 8760 hours. HbA1C: No results for input(s): HGBA1C in the last 72 hours. CBG: Recent Labs  Lab 02/27/18 1610 02/27/18 2006 02/27/18 2345 02/28/18 0503 02/28/18 0827  GLUCAP 126* 111* 127* 127* 102*   Lipid Profile: Recent Labs    02/28/18 0459  TRIG 108   Thyroid Function Tests: No results for input(s): TSH, T4TOTAL, FREET4, T3FREE, THYROIDAB in the last 72 hours. Anemia Panel: No results for input(s): VITAMINB12, FOLATE, FERRITIN, TIBC, IRON, RETICCTPCT in the last 72 hours. Sepsis Labs: No results for input(s): PROCALCITON, LATICACIDVEN in the last 168 hours.  No results found for this or any previous visit (from the past 240 hour(s)).   Radiology Studies: Dg Abd 1 View  Result Date: 02/26/2018 CLINICAL DATA:  Abdominal distention. Recent oophorectomy and omentectomy EXAM: ABDOMEN - 1 VIEW COMPARISON:  02/25/2018 FINDINGS: NG tube remains in the stomach. Diffuse gaseous distention of bowel again noted compatible with ileus. Surgical drain in the pelvis. No free air or organomegaly. IMPRESSION: Continued diffuse gaseous distention of bowel compatible with ileus. No real change. Electronically Signed   By: Rolm Baptise M.D.   On: 02/26/2018 16:44   Dg Abd Portable 1v  Result Date: 02/28/2018 CLINICAL DATA:  Ileus EXAM: PORTABLE ABDOMEN - 1 VIEW COMPARISON:   02/26/2018 FINDINGS: Scattered large and small bowel gas is again identified. The degree of gaseous distention has improved somewhat in the interval particularly in the small bowel. Gas is again noted throughout the colon. Nasogastric catheter is seen in satisfactory position. No free air is noted. No acute bony abnormality is seen. IMPRESSION: Changes consistent with ileus although mildly improved when compared with the prior exam. Electronically Signed   By: Inez Catalina M.D.   On: 02/28/2018 07:27     Scheduled Meds: . chlorhexidine gluconate (MEDLINE KIT)  15 mL Mouth Rinse BID  . enalaprilat  1.25 mg Intravenous Q6H  . enoxaparin (LOVENOX) injection  40 mg Subcutaneous Q24H  . fentaNYL  25 mcg Transdermal Q72H  . furosemide  20 mg Intravenous Daily  . insulin aspart  0-20 Units Subcutaneous Q4H  . ipratropium-albuterol  3 mL Nebulization TID  . mouth rinse  15 mL Mouth Rinse 10 times per day  . metoprolol tartrate  5 mg Intravenous Q6H  . sodium chloride flush  10-40 mL Intracatheter Q12H   Continuous Infusions: . sodium chloride Stopped (02/27/18 1447)  . sodium chloride Stopped (02/26/18 1242)  . acetaminophen 1,000 mg (02/28/18 0651)  . ampicillin-sulbactam (UNASYN) IV 3 g (02/28/18 0654)  . anidulafungin Stopped (02/27/18 1113)  . magnesium sulfate 1 - 4 g bolus IVPB    . potassium chloride    . Marland KitchenTPN (CLINIMIX-E) Adult 75 mL/hr at 02/28/18 0400  . TPN ADULT (ION)    . vancomycin Stopped (02/27/18 1549)     LOS: 32 days    Time spent: Total of 25 minutes spent with pt, greater than 50% of which was spent  in discussion of  treatment, counseling and coordination of care   Chipper Oman, MD Pager: Text Page via [email or web address]   If 7PM-7AM, please contact night-coverage [email or web address] 02/28/2018, 9:08 AM   Note - This record has been created using [employer]. Chart creation errors have been sought, but may not always have been located. Such creation errors do not reflect  on the standard of medical care.

## 2018-02-28 NOTE — Care Management Note (Signed)
Case Management Note  Patient Details  Name: Hena Ewalt MRN: [id number] Date of Birth: 13-Feb-1946  Subjective/Objective:                  Trach collar at 28% fi02, iv tpn with lipids,wound vac to abd. Midline incision,   Action/Plan: will see if insurance will allow patient to come -case still in review, Following for progression of care. Following for cm needs, none present at this time, no discharge plans at this time.  Expected Discharge Date:  01/29/18               Expected Discharge Plan:  Home/Self Care  In-House Referral:     Discharge planning Services  CM Consult  Post Acute Care Choice:    Choice offered to:     DME Arranged:    DME Agency:     HH Arranged:    HH Agency:     Status of Service:  In process, will continue to follow  If discussed at Long Length of Stay Meetings, dates discussed:    Additional Comments:  Leeroy Cha, RN 02/28/2018, 9:26 AM

## 2018-02-28 NOTE — Telephone Encounter (Signed)
ROI faxed to [facility] - AttnJanett Billow; release [phone]

## 2018-02-28 NOTE — Progress Notes (Signed)
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  She is completed 3 cycles of chemotherapy and is now status post bilateral salpingo-oophorectomy and omentectomy with debulking on 8/22.   8/26 transferred to ICU w/ concern for perforation w/ free air noted.  Imipenem started. Went for exploratory laparotomy with washout.  No significant contamination identified, presumed spontaneously closed microperf. Returned with abdominal drains postoperative chest x-ray showing evidence of aspiration pneumonia; mixed respiratory and metabolic acidosis & in septic shock. Required intubation.   8/27 GI medicine consulted, did not feel she had overt GIB.  Rec's prophylaxis with PPI.  Signed off.  Weaning pressors. Hgb dropped from 8.8 to 7.4; got 2 units of blood,  febrile, added. vancomycin.  Neupogen given for neutropenia.   8/28-9/1 Shock resolved, TPN started. Fevers. Delirium barrier to weaning.  ABX for fever  9/02-9/6 Delirium. Sedation difficult, diuresing, remains on abx. spiking temps, loculated fluid collection identified in left pelvis. Not amedable to drainage.  ID consulted. Started seroquel. 9/4. Upper 2 drains removed. Staples removed.from incision site.  9/4-9/9 ID consulted by family.  Fevers ongoing, CT abd with improved fluid collections.  Failing PSV wean.  No pressors.  No sedation.   Husband deferring decisions about care to son (newly married).   9/10  Sister at bedside - says she is not in favor of trach but says is the decision of son/dpoa. Son met with Dr Alvy Bimler for goals of care - current status precludes chemo but goal is trach and full code and full medical care and later chemo when ECOG improves.  Low grade fever persists - ID following. More  awake today. No Perf leak on CT abd and restarted TF 9/12-9/14  s/p Trach - Dr Nelda Marseille. Levophed post trach.  Fevers.  1 unit PRBC 9/15 ATC for first time; tolerating well. Did PRVC overnight 9/19: Ileus by CT abdomen.  Is been requiring full mechanical ventilatory support due to abdominal distention 9/19 through 9/23: Off the ventilator now for over 48 hours.  Starting more frequent Passy-Muir valve trialing on 9/23.  Tube feeds still on hold due to ileus and malabsorption. Subjective / Interval Events  No distress.  Denies abdominal or chest pain  Objective    Blood pressure (Abnormal) 156/58, pulse (Abnormal) 117, temperature 98.2 F (36.8 C), temperature source Oral, resp. rate (Abnormal) 21, height '5\' 3"'$  (1.6 m), weight 54.6 kg, SpO2 96 %.    FiO2 (%):  [28 %] 28 %   Intake/Output Summary (Last 24 hours) at 02/28/2018 1030 Last data filed at 02/28/2018 0945 Gross per 24 hour  Intake 4166.48 ml  Output 2755 ml  Net 1411.48 ml   Filed Weights   02/26/18 0455 02/27/18 0500 02/28/18 0500  Weight: 53.8 kg 57 kg 54.6 kg     EXAM  General: 72 year old female patient currently resting in bed she is in no acute distress however she is laying sideways in the bed, she has a hard time getting comfortable HEENT: Temporal wasting, size 6 cuffed tracheostomy in place.  When cuff is deflated her phonation is excellent Pulmonary: Some scattered rhonchi that improved with cough Cardiac: Regular rate and rhythm Abdomen: Soft, nontender wound VAC unremarkable positive bowel sounds Extremities: No edema brisk cap refill strong pulses Neuro: More  awake, interactive, remains confused but cooperative GU: Clear yellow   Labs    PULMONARY No labs  CBC Recent Labs  Lab 02/26/18 0620 02/27/18 0455 02/28/18 0459  HGB 9.1* 9.0* 8.8*  HCT 28.8* 28.2* 27.2*  WBC 9.9 9.2 9.3  PLT 329 291 282    COAGULATION No results for input(s): INR in the last 168 hours. CARDIAC   No results for  input(s): TROPONINI in the last 168 hours. No results for input(s): PROBNP in the last 168 hours.  CHEMISTRY Recent Labs  Lab 02/25/18 0455 02/26/18 0454 02/26/18 0620 02/27/18 0455 02/28/18 0459  NA 145 133* 144 140 136  K 3.4* 4.7 2.8* 3.0* 3.1*  CL 105 95* 103 102 99  CO2 32 '29 30 30 29  '$ GLUCOSE 150* 855* 147* 140* 121*  BUN '14 18 20 22 16  '$ CREATININE 0.48 0.56 0.44 0.47 0.39*  CALCIUM 8.7* 8.4* 8.6* 8.4* 8.1*  MG 1.9 2.3 1.8 1.8 1.7  PHOS 3.6 6.7* 3.8 3.8 3.3   Estimated Creatinine Clearance: 52.6 mL/min (A) (by C-G formula based on SCr of 0.39 mg/dL (L)).  LIVER Recent Labs  Lab 02/22/18 0326 02/23/18 0645 02/25/18 0455 02/28/18 0459  AST '29 26 19 21  '$ ALT '23 24 18 14  '$ ALKPHOS 84 91 79 94  BILITOT 0.6 0.6 0.8 0.2*  PROT 6.8 7.6 7.5 6.4*  ALBUMIN 1.7* 1.9* 2.0* 1.7*   INFECTIOUS No results for input(s): LATICACIDVEN, PROCALCITON in the last 168 hours.  ENDOCRINE CBG (last 3)  Recent Labs    02/27/18 2345 02/28/18 0503 02/28/18 0827  GLUCAP 127* 127* 102*   IMAGING Abdomen CT reports reviewed  Micro Data: Blood Culture x2 8/26 >> neg Sputum 8/27: Abundant yeast, few Candida albicans Blood Cx 9/1 >> negative  Respiratory 9/1 >> few Candida Urine 9/1 >> negative Peritoneal fluid 8/31 >> neg Abd Drainage 9/5 >> few lactobacillus  JP Drainage 9/8 >> E-Coli C-Diff 9/12 >> negative  Antimicrobials:  Meropenem 8/26 >> 9/11 Vancomycin 8/27 >> 8/30; 9/1 >> 9/11, 9/17>> Eraxis 8/27 >>  Unasyn 9/11 >>   Studies: CT abdomen 8/26: Result noted   echocardiogram 8/27: Particular ejection fraction 65 to 70%.  She has moderate LVH, and hyperdynamic LV function.  There is grade 1 diastolic dysfunction.  Trivial MR.  Normal left atrial size.  The IV C was not visualized  CT abdomen 9/2 >> drainage catheters within the pelvis, RUQ, LUQ.  Significant interval decrease in size of previously described intra-abdominal fluid, no residual free intraperitoneal air.  There is a persistent loculated fluid & gas collection along the left pelvic sidewall measuring up to 10 cm.  Moderate bilateral pleural effusions, underlying opacities favored to represent atelectasis.  Marked wall thickening of the sigmoid colon.  Peripheral low attenuation within the spleen concerning for splenic infarcts.  Wall thickening of the urinary bladder.   CT abdomen 9/6: Improving left lateral pelvic fluid collection, resolved peritoneal fluid in the left lower abdomen.  Concentric thickening of the sigmoid colon.  Decreased bilateral effusion and atelectasis.  CT ABD 9/9 Similar to slight decrease in size of ill-defined complex fluid and gas in the lateral left hemipelvis. 2. No evidence of contrast communication between the thickened sigmoid and this pelvic sidewall process. 3. Foley catheter and air within theurinary bladder, which is not decompressed. Correlate with catheter function.   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  This does appear to slowly been improving.  She is awake, and now interactive. -Phonation is strong with Passy-Muir valve, but does not tolerate finger occlusion Plan Keep tracheostomy cuff deflated Wean oxygen Encourage Passy-Muir valve as much as possible with supervision Get out of bed Reassess on 9/25, if continuing to do well I think we can downsized to 4 cuffless.  This should further help with Passy-Muir valve, communication as well as swallowing trials   Status post exploratory laparotomy for free air on 8/26 ; following bilateral salpingo-oophorectomy, omentectomy, and debulking surgery on 8/22.  Developed fluid collections not amenable to IR drainage.  Reducing in size on CTA/P Plan continue routine wound care Antibiotics per infectious disease   Acute metabolic encephalopathy -Mental status is improving Plan Continue supportive care  Fluid and electrolyte balance: Hypokalemia Plan Replace and recheck.  Severe Protein Calorie Malnutrition, prealbumin 3.5 Plan  Continue supplementation  Anemia of critical illness Plan Trend CBC  Hyperglycemia Plan  sliding scale insulin  Best practice/Goals of care  DVT prophylaxis: LMWH GI prophylaxis: PPI Diet: Currently n.p.o. with NG tube in place to suction Mobility: BR Code Status: Full Code Family Communication: Patient has been updated at bedside 02/24/2018.   Disposition / Summary of Today's Plan 02/28/2018  Clinically she is slowly improving.  It appears as though her delirium is finally coming around.  Although she still is a bit impulsive and confused at times.  Although I do not think she will be a candidate for any further chemotherapy in the near future she is clearly making some slow progress in regards to recovering from her critical illness.  From a pulmonary standpoint my primary goal at this point will be to work towards decannulation over the next couple weeks.  We will start this by escalating Passy-Muir valve trialing, will reevaluate again on the 25th, if no issues I think we can downsize her tracheostomy.  I would like to see her sitting up, demonstrating improving muscular strength as well as good cough mechanics and improving core strength before removing tracheostomy altogether  Erick Colace ACNP-BC [place] Pager # [phone] OR # [phone] if no answer

## 2018-02-28 NOTE — Progress Notes (Signed)
   02/28/18 1543  Clinical Encounter Type  Visited With Patient and family together  Visit Type Initial;Spiritual support  Referral From Physician  Consult/Referral To Chaplain  Chaplain responded to physician's request for Pt. spiritual care. Chaplain was warmly greeted by family: son, sister, and grand-daughter. Pt. smiled when chaplain introduced herself, but patient was experiencing pain waiting for medication to arrive.  Chaplain observed Pt. son and sister comfort Pt. in her distress. Chaplain asked if she could visit again as Pt. family was planning to get something to eat, while Pt. is comfortably sleeping. Pt. family responded yes to future spiritual care. Pt. family accepted chaplain's prayers for Pt.

## 2018-02-28 NOTE — Progress Notes (Signed)
[place] NOTE   Pharmacy Consult for TPN Indication: ileus  Patient Measurements: Height: 5\' 3"  (160 cm) Weight: 121 lb 0.5 oz (54.9 kg) IBW/kg (Calculated) : 52.4 TPN AdjBW (KG): 58.3 Body mass index is 21.44 kg/m.  Body mass index is 21.32 kg/m. Filed Weights   02/26/18 0455 02/27/18 0500 02/28/18 0500  Weight: 118 lb 9.7 oz (53.8 kg) 125 lb 10.6 oz (57 kg) 120 lb 5.9 oz (54.6 kg)    HPI: Pt is a 72 year old with ovarian cancer status post bilateral salpingo-oophorectomy and omentectomy with debulking on 8/22. Ex lap on 8/26 and transferred to ICU afterward. Remains critically ill on vent support, now s/p trach placement. Was previously on TPN 8/27-9/5, however has lately had issues tolerating enteral nutrition. TFs held since 9/17 d/t abdominal distention; CT abd shows persistent ileus. NG output remains high. [facility] resuming TPN per pharmacy on 9/19.  Significant events:  - 8/27 Start TPN. Hold lipids for first 7 days while pt in ICU  - 8/30 scheduled Novolog insulin added q4h - 9/2 CT scan of abdomenshowsundrained left pelvic fluid collection, IR consulted - defer drainage for now - 9/4 Lipids started with TPN. Tube feeds (Vital AF 1.2 Cal) initiated at 19mL/hr, order to increase by 20mL/hr every 8 hours to goal rate of 71mL/hr - 9/5 TPN weaned off - 9/19 resume TPN   Insulin requirements past 24 hours: 12 units resistant SSI / 24 hrs  (no hx DM) Current Nutrition: NPO, TF held  IVF: None  Central access: 02/01/18 TPN start date: 8/27-9/5, restart 9/19  ASSESSMENT                                                                                                          Today:   Glucose:  CBGs 113-140, more stable since rate to goal (increase in insulin since at goal, but lower AM requirements 9/22)  Electrolytes: (Goals K >4, Mg >2) K 3.1, Mag 1.7, phos 3.3, CorrCa 9.94  Lasix 40mg  IV daily  NG output ~ 463mL/d >  increasing over past 24h  Renal:  SCr 0.47, UOP 1 ml/kg/hr  LFTs:  WNL (9/20)  TGs:  108 (9/23)  Prealbumin: 3.5 (9/2), 10.6 (9/20), 8.7 (9/23)   NUTRITIONAL GOALS                                                                                             RD recs (9/17):  82-93g Protein, 1750-1925 Kcal per day Clinimix 5/15 at a goal rate of 75 ml/hr + 20% fat emulsion 240 ml/day to provide: 90 g of protein and 1758 kCals per day meeting 100% of protein and 100%  of kCal needs.  PLAN                                                                                                                         Now: Potassium 84meq IV x 6 runs Magnesium 2g IV x 1  At 1800 today:  Continue Clinimix E 5/15 at 75 ml/hr. (goal rate)  20% fat emulsion at 20 ml/hr x12 hours/day  TPN to contain standard multivitamins and trace element daily  TPN to contain Pepcid 40mg  IV daily  IVF per MD, currently none  Continue CBGs and SSI q4h (resistant)  TPN lab panels on Mondays & Thursday    TPN at 25ml/hr at goal rate This TPN provides 90 g of protein, 270 g of dextrose, and 54 g of lipids for a total of 1818 kcals meeting 100 % of patient needs Electrolytes in TPN: standard concentration; Cl:Ac 1:1 Add MVI, trace elements, and famotidine 40 mg to TPN Continue resistant SSI and adjust as needed Monitor TPN labs, CMP, Mg, Phos in AM F/U daily   Ulice Dash, PharmD Clinical Pharmacist  02/28/2018 7:29 AM

## 2018-02-28 NOTE — Progress Notes (Signed)
Patient ID: Jo Parker, female   DOB: [birth year], 72 y.o.   MRN: [id number]         [facility] for Infectious Disease  Date of Admission:  01/27/2018           Day 29 vancomycin        Day 29 anidulafungin        Day 12 ampicillin sulbactam ASSESSMENT: She is now completed 4 weeks of broad empiric antibiotic therapy for postoperative intra-abdominal infection.  She has been afebrile recently and her repeat abdominal and pelvic CT scan shows improvement.  I will stop her antibiotics now.  PLAN: 1. Stop antibiotics and observe.  Principal Problem:   Pelvic abscess in female Active Problems:   Severe sepsis ([place])   Peritonitis ([place])   HCAP (healthcare-associated pneumonia)   Postoperative wound infection   Primary peritoneal carcinomatosis ([place])   Ovarian cancer ([place])   Status post bilateral salpingo-oophorectomy   Severe protein-calorie malnutrition ([name])   COPD (chronic obstructive pulmonary disease) ([name])   HLD (hyperlipidemia)   Absolute anemia   White coat syndrome with hypertension   AKI (acute kidney injury) ([place])   Acute metabolic encephalopathy   Acute respiratory distress   Low urine output   Somnolence   At risk for fluid volume overload   Free intraperitoneal air   Acute on chronic respiratory failure ([name])   Pressure injury of skin   Aspiration pneumonia ([name])   Wound dehiscence, surgical, initial encounter   Scheduled Meds: . chlorhexidine gluconate (MEDLINE KIT)  15 mL Mouth Rinse BID  . enalaprilat  1.25 mg Intravenous Q6H  . enoxaparin (LOVENOX) injection  40 mg Subcutaneous Q24H  . fentaNYL  25 mcg Transdermal Q72H  . furosemide  20 mg Intravenous Daily  . insulin aspart  0-20 Units Subcutaneous Q4H  . ipratropium-albuterol  3 mL Nebulization TID  . mouth rinse  15 mL Mouth Rinse 10 times per day  . metoprolol tartrate  10 mg Intravenous Q6H  . sodium chloride flush  10-40 mL Intracatheter Q12H   Continuous Infusions: . sodium  chloride Stopped (02/27/18 1447)  . sodium chloride Stopped (02/28/18 1032)  . acetaminophen 1,000 mg (02/28/18 1537)  . [START ON 03/01/2018] acetaminophen    . ampicillin-sulbactam (UNASYN) IV Stopped (02/28/18 1259)  . anidulafungin Stopped (02/28/18 1212)  . potassium chloride 10 mEq (02/28/18 1539)  . Marland KitchenTPN (CLINIMIX-E) Adult 75 mL/hr at 02/28/18 1151  . TPN ADULT (ION)    . vancomycin 1,000 mg (02/28/18 1328)   PRN Meds:.sodium chloride, sodium chloride, fentaNYL (SUBLIMAZE) injection, hydrALAZINE, iopamidol, labetalol, ondansetron (ZOFRAN) IV, sodium chloride flush  Review of Systems: Review of Systems  Unable to perform ROS: Intubated    Allergies  Allergen Reactions  . Oxycodone     confusion    OBJECTIVE: Vitals:   02/28/18 1330 02/28/18 1400 02/28/18 1500 02/28/18 1557  BP:  (!) 154/73 (!) 171/81   Pulse: 93 95 99 93  Resp: (!) 21 18 (!) 23 (!) 27  Temp:      TempSrc:      SpO2: 98% 99% 99% 96%  Weight:      Height:       Body mass index is 21.32 kg/m.  Physical Exam  Constitutional:  She is alert but does not respond to questions today.  She seems restless.  Her husband is at the bedside.  Cardiovascular: Normal rate, regular rhythm and normal heart sounds.  No murmur heard. Pulmonary/Chest: Effort normal. She has  no wheezes. She has no rales.  She is using trach collar oxygen.    Abdominal: Soft. She exhibits no distension. There is no tenderness.  Her wound was reported to be beefy red and granulating this morning during dressing change.  There is only a scant amount of yellow drainage in her abdominal drain bulb.  Skin: No rash noted.    Lab Results Lab Results  Component Value Date   WBC 9.3 02/28/2018   HGB 8.8 (L) 02/28/2018   HCT 27.2 (L) 02/28/2018   MCV 78.6 02/28/2018   PLT 282 02/28/2018    Lab Results  Component Value Date   CREATININE 0.39 (L) 02/28/2018   BUN 16 02/28/2018   NA 136 02/28/2018   K 3.1 (L) 02/28/2018   CL 99  02/28/2018   CO2 29 02/28/2018    Lab Results  Component Value Date   ALT 14 02/28/2018   AST 21 02/28/2018   ALKPHOS 94 02/28/2018   BILITOT 0.2 (L) 02/28/2018     Microbiology: No results found for this or any previous visit (from the past 240 hour(s)).  Michel Bickers, MD [facility] for [facility] [phone] pager   [phone] cell 02/28/2018, 4:18 PM

## 2018-03-01 DIAGNOSIS — R410 Disorientation, unspecified: Secondary | ICD-10-CM

## 2018-03-01 LAB — CBC WITH DIFFERENTIAL/PLATELET
BASOS PCT: 0 %
Basophils Absolute: 0 10*3/uL (ref 0.0–0.1)
EOS PCT: 1 %
Eosinophils Absolute: 0.1 10*3/uL (ref 0.0–0.7)
HCT: 29.1 % — ABNORMAL LOW (ref 36.0–46.0)
HEMOGLOBIN: 9.6 g/dL — AB (ref 12.0–15.0)
Lymphocytes Relative: 19 %
Lymphs Abs: 1.9 10*3/uL (ref 0.7–4.0)
MCH: 25.3 pg — AB (ref 26.0–34.0)
MCHC: 33 g/dL (ref 30.0–36.0)
MCV: 76.6 fL — ABNORMAL LOW (ref 78.0–100.0)
MONO ABS: 0.6 10*3/uL (ref 0.1–1.0)
MONOS PCT: 6 %
NEUTROS ABS: 7.3 10*3/uL (ref 1.7–7.7)
Neutrophils Relative %: 74 %
PLATELETS: 300 10*3/uL (ref 150–400)
RBC: 3.8 MIL/uL — ABNORMAL LOW (ref 3.87–5.11)
RDW: 20.3 % — AB (ref 11.5–15.5)
WBC: 9.9 10*3/uL (ref 4.0–10.5)

## 2018-03-01 LAB — COMPREHENSIVE METABOLIC PANEL
ALBUMIN: 1.8 g/dL — AB (ref 3.5–5.0)
ALT: 14 U/L (ref 0–44)
AST: 22 U/L (ref 15–41)
Alkaline Phosphatase: 114 U/L (ref 38–126)
Anion gap: 8 (ref 5–15)
BUN: 14 mg/dL (ref 8–23)
CHLORIDE: 100 mmol/L (ref 98–111)
CO2: 29 mmol/L (ref 22–32)
CREATININE: 0.38 mg/dL — AB (ref 0.44–1.00)
Calcium: 8.3 mg/dL — ABNORMAL LOW (ref 8.9–10.3)
GFR calc Af Amer: >60 mL/min (ref >60)
GLUCOSE: 141 mg/dL — AB (ref 70–99)
Potassium: 3.4 mmol/L — ABNORMAL LOW (ref 3.5–5.1)
Sodium: 137 mmol/L (ref 135–145)
Total Bilirubin: 0.4 mg/dL (ref 0.3–1.2)
Total Protein: 6.9 g/dL (ref 6.5–8.1)

## 2018-03-01 LAB — GLUCOSE, CAPILLARY
GLUCOSE-CAPILLARY: 142 mg/dL — AB (ref 70–99)
Glucose-Capillary: 136 mg/dL — ABNORMAL HIGH (ref 70–99)
Glucose-Capillary: 139 mg/dL — ABNORMAL HIGH (ref 70–99)
Glucose-Capillary: 148 mg/dL — ABNORMAL HIGH (ref 70–99)
Glucose-Capillary: 154 mg/dL — ABNORMAL HIGH (ref 70–99)
Glucose-Capillary: 165 mg/dL — ABNORMAL HIGH (ref 70–99)

## 2018-03-01 LAB — MAGNESIUM: Magnesium: 1.9 mg/dL (ref 1.7–2.4)

## 2018-03-01 LAB — PHOSPHORUS: Phosphorus: 3.1 mg/dL (ref 2.5–4.6)

## 2018-03-01 MED ORDER — POTASSIUM CHLORIDE 10 MEQ/50ML IV SOLN
10.0000 meq | INTRAVENOUS | Status: AC
  Administered 2018-03-01 (×3): 10 meq via INTRAVENOUS
  Filled 2018-03-01 (×3): qty 50

## 2018-03-01 MED ORDER — TRAVASOL 10 % IV SOLN
INTRAVENOUS | Status: AC
  Administered 2018-03-01: 18:00:00 via INTRAVENOUS
  Filled 2018-03-01: qty 900

## 2018-03-01 MED ORDER — POTASSIUM CHLORIDE 10 MEQ/50ML IV SOLN: 10.0000 meq | INTRAVENOUS | Status: DC

## 2018-03-01 MED ORDER — RISPERIDONE 0.5 MG PO TBDP
0.5000 mg | ORAL_TABLET | Freq: Two times a day (BID) | ORAL | Status: DC
  Administered 2018-03-01 – 2018-03-02 (×4): 0.5 mg via ORAL
  Filled 2018-03-01 (×5): qty 1

## 2018-03-01 MED ORDER — CLONIDINE HCL 0.2 MG/24HR TD PTWK
0.2000 mg | MEDICATED_PATCH | TRANSDERMAL | Status: DC
  Administered 2018-03-01: 0.2 mg via TRANSDERMAL
  Filled 2018-03-01: qty 1

## 2018-03-01 MED ORDER — FENTANYL CITRATE (PF) 100 MCG/2ML IJ SOLN
12.5000 ug | INTRAMUSCULAR | Status: DC | PRN
  Administered 2018-03-01 – 2018-03-03 (×7): 12.5 ug via INTRAVENOUS
  Filled 2018-03-01 (×7): qty 2

## 2018-03-01 NOTE — Progress Notes (Signed)
Physical Therapy Treatment Patient Details Name: Jo Parker MRN: [id number] DOB: 11-Jul-1945 Today's Date: 03/01/2018    History of Present Illness 72 yo female with history of COPD,MVP,anxiety/depression,ovarian cancer, admitted  01/27/18 with pelvic abcess. s/P  interval debulking surgery with Dr. Skeet Latch on 01/27/18 consisting of an exploratory laparotomy infra-gastric omentectomy bilateral salpingo-oophorectomy radical optimal tumor debulking, resection of anterior abdominal wall, complicated by findings to suggest bowel perforation on CT from 8/26/  required reexploration with VDRF due to aspiration. Tracheostomy on 02/17/18 . Currently on  Trach collar, had trial of PMV with speech    PT Comments    +2 max assist for bed mobility and to transfer to a recliner. Fair to poor sitting balance. Assisted with pericare as pt's bed was saturated in urine (purewick canister was overfull)  at start of PT session.   Follow Up Recommendations  LTACH     Equipment Recommendations  None recommended by PT(defer to next venue)    Recommendations for Other Services       Precautions / Restrictions Precautions Precautions: Fall;Other (comment) Precaution Comments: NGT, JP drain, Trach collar, Flexiseal. profound weakness from long term vent. Restrictions Weight Bearing Restrictions: No    Mobility  Bed Mobility Overal bed mobility: Needs Assistance Bed Mobility: Rolling;Sidelying to Sit Rolling: +2 for physical assistance;+2 for safety/equipment;Max assist Sidelying to sit: +2 for physical assistance;+2 for safety/equipment;Max assist     Sit to sidelying: +2 for physical assistance;+2 for safety/equipment General bed mobility comments: step by step multi-modal cues for task sequencing, assist for LEs and trunk  for s/l to sit; pt's bed was satureated in urine bc purewick canister was full, assisted with pericare and gown change  Transfers Overall transfer level: Needs  assistance Equipment used: 2 person hand held assist Transfers: Sit to/from Stand;Stand Pivot Transfers   Stand pivot transfers: Max assist;+2 physical assistance;+2 safety/equipment       General transfer comment: +2 to rise and pivot, pt 30%, tried standing with RW but pt didn't grip onto RW with hands  Ambulation/Gait                 Stairs             Wheelchair Mobility    Modified Rankin (Stroke Patients Only)       Balance Overall balance assessment: Needs assistance Sitting-balance support: Feet unsupported;Single extremity supported;Bilateral upper extremity supported Sitting balance-Leahy Scale: Poor Sitting balance - Comments: poor to fair, pt briefly able to maintain static sitting on EOB with close supervision  Postural control: Posterior lean   Standing balance-Leahy Scale: Zero                              Cognition Arousal/Alertness: Awake/alert Behavior During Therapy: WFL for tasks assessed/performed Overall Cognitive Status: Difficult to assess Area of Impairment: Problem solving                   Current Attention Level: Selective   Following Commands: Follows one step commands with increased time;Follows one step commands inconsistently     Problem Solving: Slow processing;Decreased initiation;Requires verbal cues;Requires tactile cues General Comments: patient  follows commands inconsistently, requires hand over hand  at times to follow functional commands/delayed/decr initiation      Exercises      General Comments        Pertinent Vitals/Pain Faces Pain Scale: No hurt    Home Living  Prior Function            PT Goals (current goals can now be found in the care plan section) Acute Rehab PT Goals Patient Stated Goal: per spouse, for patient to get up amd moving PT Goal Formulation: With patient/family Time For Goal Achievement: 03/08/18 Potential to Achieve Goals:  Fair    Frequency    Min 2X/week      PT Plan Current plan remains appropriate    Co-evaluation PT/OT/SLP Co-Evaluation/Treatment: Yes            AM-PAC PT "6 Clicks" Daily Activity  Outcome Measure  Difficulty turning over in bed (including adjusting bedclothes, sheets and blankets)?: Unable Difficulty moving from lying on back to sitting on the side of the bed? : Unable Difficulty sitting down on and standing up from a chair with arms (e.g., wheelchair, bedside commode, etc,.)?: Unable Help needed moving to and from a bed to chair (including a wheelchair)?: Total Help needed walking in hospital room?: Total Help needed climbing 3-5 steps with a railing? : Total 6 Click Score: 6    End of Session   Activity Tolerance: Patient tolerated treatment well;Patient limited by fatigue Patient left: with call bell/phone within reach;in chair;with chair alarm set;with nursing/sitter in room Nurse Communication: Mobility status PT Visit Diagnosis: Muscle weakness (generalized) (M62.81)     Time: 2395-3202 PT Time Calculation (min) (ACUTE ONLY): 36 min  Charges:  $Therapeutic Activity: 23-37 mins                     Blondell Reveal Kistler PT 03/01/2018  Acute Rehabilitation Services Pager [phone] Office [phone]

## 2018-03-01 NOTE — Progress Notes (Addendum)
Gynecologic Oncology Progress Note  72 y.o. female diagnosed with Stage IV ovarian cancer who received four cycles of neoadjuvant chemotherapy with carboplatin and taxol with fourth cycle completed on 12/20/17 .  She underwent interval debulking surgery with Dr. Skeet Latch on 01/27/18 consisting of an exploratory laparotomy infra-gastric omentectomy bilateral salpingo-oophorectomy radical optimal tumor debulking, resection of anterior abdominal wall.  Her post-operative course was complicated by findings to suggest bowel perforation on CT from 01/31/18 post-op with no site visualized on re-operation. It was felt that aspiration most likely occurred during intubation with the re-operation.          Subjective: Patient alert, smiling.  Nodding to questions.  Husband and RN at the bedside.  Husband states pt sat in chair yesterday for two hours and tolerated well. Denies pain.    Objective: Vital signs in last 24 hours: Temp:  [97.4 F (36.3 C)-98.9 F (37.2 C)] 98.4 F (36.9 C) (09/24 1142) Pulse Rate:  [85-126] 126 (09/24 1131) Resp:  [16-30] 21 (09/24 1131) BP: (138-223)/(49-115) 186/106 (09/24 1131) SpO2:  [95 %-100 %] 97 % (09/24 1131) FiO2 (%):  [28 %] 28 % (09/23 1557) Weight:  [121 lb 7.6 oz (55.1 kg)] 121 lb 7.6 oz (55.1 kg) (09/24 0500) Last BM Date: 02/28/18  Intake/Output from previous day: 09/23 0701 - 09/24 0700 In: 3293.1 [I.V.:1802.7; IV Piggyback:1490.4] Out: 2695 [Urine:1975; Emesis/NG output:425; Drains:20; Stool:275]  Physical Examination:  General: alert, appears agitated/restless, nodding head when asked questions  Cardio: Tachycardic at 120 bpm  Resp: Clear breath sounds.  Trach in place.  Abdomen:   Abdomen more soft, hypoactive bowel sounds in all quadrants.  Remains tympanic on percussion. Nontender, no rebound or guarding.   NG suction on LIS with 200cc noted in cannister of dark brown output (change in color)   Wound VAC in place with 200cc in the  canister.    LLQ JP with minimal serous output   Extremities: SCD in place, no edema  Flexiseal in place with 100 cc output marked from yesterday.  Urine wick in place on suction with clear urine in canister  Labs: WBC/Hgb/Hct/Plts:  9.9/9.6/29.1/300 (09/24 0240) BUN/Cr/glu/ALT/AST/amyl/lip:  14/0.38/--/14/22/--/-- (09/24 0240)    Assessment: 72 y.o. s/p Procedure(s): Interval debulking surgery with Dr. Skeet Latch on 01/27/18 consisting of an exploratory laparotomy infra-gastric omentectomy bilateral salpingo-oophorectomy radical optimal tumor debulking, resection of anterior abdominal wall.  Reoperation of EXPLORATORY LAPAROTOMY, ABDOMINAL WASHOUT on 01/31/18:   Neuro: Alert, less agitated, fentanyl patch in place  Pain: Fentanyl patch in place.  Fentanyl IV ordered for breakthrough but RN states they are attempting to minimize use to prevent resp suppression.  Plan to continue IV Tylenol.    Heme: Post-operative anemia: Hgb 9.6 and Hct 29.1 this am. Last transfusion 02/19/18.  Anemia related to acute critical illness, no evidence of active bleeding.   ID: Fevers resolved.  IV antibiotics completed 02/28/18 after one month of therapy.  Presumed sepsis initially from gastrointestinal source but unable to identify the perforation after exp lap on 01/31/18. Contribution from pneumonia and possibly undrained left pelvic fluid collection seen on CT scan 02/07/18; however the pelvic collection is improving.   Followup CT's have shown decrease in pelvic fluid collection.  Granix received 02/01/18 per Dr. Alvy Bimler.  Vancomycin stopped on 02/04/18 and then fevers began again. Restarted Vancomycin on 02/06/18. Anidulafungin initiated 02/01/18.  Meropenum IV started 01/31/18 and discontinued on 02/16/18.  Unasyn started 02/16/18.  Blood cultures from 01/31/18 & 02/06/18 &  02/17/18 with no growth.  Tracheal aspirate from 02/01/18 resulting few gram positive rods. Repeated 02/06/18 showed few yeast.  CV: Tachycardia and  hypertension managed per Hospitalist.   Resp: ARDS in setting of aspiration pneumonia and sepsis.  Trach in place. Per ICU/Pulm/Critical care.  GI: Abd 1 view 02/28/18 am:  Changes consistent with ileus although mildly improved when compared with the prior exam. -CT AP on 01/31/18 with findings consistent with GI perforation.  No obvious perforation identified on re-operation on 01/31/18. -C diff negative.  - Flexiseal in place; nursing states with gas at times -TPN initially started 02/02/18 and discontinued 02/10/18 after tube feedings began. TPN restarted due to ileus 02/24/18 - Persistent abdominal distension- CT AP 9/18- ileus.  -02/13/18 -Enteric versus old hematoma contents in LLQ JP.  -JP fluid culture from 02/13/18:  FEW WBC PRESENT, PREDOMINANTLY PMN  MODERATE GRAM NEGATIVE RODS FEW GRAM POSITIVE RODS - CT pelvis 02/14/18: Stable to slight decrease in pelvic fluid collection, no evidence of contrast communication.   GU: AKI post-op resolved. Creatinine 0.38 this am.  Purewick in place to suction with normal appearing urine in canister.   FEN: Replacement of K+ per Hospitalist [place]- K+ 3.4 this am  Prophylaxis: SCDs in place. Continue Lovenox prophylaxis. No active bleeding.  ADL:  - PTx working with patient. Goal to chair by Monday - SLP working with patient on speech and presumably will help with swallowing eval once we clear her GI tract for oral intake.  Plan: 1. Wound VAC to continue.  2. Continue TPN.  3. NG to suction and bowel rest until abd distension improves.  4. IV Tylenol ordered 5.         Continue to monitor for delirium with the Fentanyl dosing. 6.         Continue plan per Hospitalist/CC/ID/Dr. Gerarda Fraction 7.         Plan for repeat abdominal xray in am   Dorothyann Gibbs 03/01/2018, 11:53 AM

## 2018-03-01 NOTE — Progress Notes (Addendum)
[facility] NOTE   Pharmacy Consult for TPN Indication: ileus  Patient Measurements: Height: 5\' 3"  (160 cm) Weight: 121 lb 0.5 oz (54.9 kg) IBW/kg (Calculated) : 52.4 TPN AdjBW (KG): 58.3 Body mass index is 21.44 kg/m.  Body mass index is 21.52 kg/m. Filed Weights   02/27/18 0500 02/28/18 0500 03/01/18 0500  Weight: 125 lb 10.6 oz (57 kg) 120 lb 5.9 oz (54.6 kg) 121 lb 7.6 oz (55.1 kg)    HPI: Pt is a 72 year old with ovarian cancer status post bilateral salpingo-oophorectomy and omentectomy with debulking on 8/22. Ex lap on 8/26 and transferred to ICU afterward. Remains critically ill on vent support, now s/p trach placement. Was previously on TPN 8/27-9/5, however has lately had issues tolerating enteral nutrition. TFs held since 9/17 d/t abdominal distention; CT abd shows persistent ileus. NG output remains high. [facility] resuming TPN per pharmacy on 9/19.  Significant events:  - 8/27 Start TPN. Hold lipids for first 7 days while pt in ICU  - 8/30 scheduled Novolog insulin added q4h - 9/2 CT scan of abdomenshowsundrained left pelvic fluid collection, IR consulted - defer drainage for now - 9/4 Lipids started with TPN. Tube feeds (Vital AF 1.2 Cal) initiated at 74mL/hr, order to increase by 50mL/hr every 8 hours to goal rate of 44mL/hr - 9/5 TPN weaned off - 9/19 resume TPN   Insulin requirements past 24 hours: 12 units resistant SSI / 24 hrs  (no hx DM) Current Nutrition: NPO, TF held  IVF: NaCl KVO  Central access: 02/01/18 TPN start date: 8/27-9/5, restart 9/19  ASSESSMENT                                                                                                          Today:   Glucose:  CBGs 97-142  Electrolytes: (Goals K >4, Mg >2) K 3.4, Mag 1.9, phos 3.3, CorrCa 10  Lasix 20 mg IV daily  NG output 600 ml 9/23  Renal:  SCr 0.38, UOP 2.2 ml/kg/hr  LFTs:  WNL (9/24)  TGs:  108 (9/23)  Prealbumin: 3.5 (9/2),  10.6 (9/20), 8.7 (9/23)   NUTRITIONAL GOALS                                                                                             RD recs (9/17):  82-93g Protein, 1750-1925 Kcal per day TPN providing: 90 g of protein and 1758 kCals per day meeting 100% of protein and 100% of kCal needs.  PLAN  Now: Potassium 10 meq IV x 3  At 1800 today:  TPN at 26ml/hr at goal rate This TPN provides 90 g of protein, 270 g of dextrose, and 54 g of lipids for a total of 1818 kcals meeting 100 % of patient needs Electrolytes in TPN: Na 90 mEq, K 126 mEq, Ca 9 mEq, Mg 18 mEq, Phos 27 mmol, Cl 145.8 mEq (K increased from 50 mEq/L to 70 mEq/L 9/24) Cl:Ac 1:1 Add MVI, trace elements, and famotidine 40 mg to TPN Continue resistant SSI and adjust as needed Monitor TPN labs, CMP, Mg, Phos in AM F/U daily  Ulice Dash, PharmD Clinical Pharmacist Pager # [phone]  03/01/2018 7:11 AM

## 2018-03-01 NOTE — Progress Notes (Signed)
Patient ID: Jo Parker, female   DOB: 05-17-1946, 72 y.o.   MRN: [id number]         [facility]  Date of Admission:  01/27/2018            ASSESSMENT: She is more confused but remains afebrile.  I will continue observation off of antibiotics.  PLAN: 1. Observe off of antibiotics  Principal Problem:   Pelvic abscess in female Active Problems:   Severe sepsis ([place])   Peritonitis ([place])   HCAP (healthcare-associated pneumonia)   Postoperative wound infection   Primary peritoneal carcinomatosis ([place])   Ovarian cancer ([place])   Status post bilateral salpingo-oophorectomy   Severe protein-calorie malnutrition ([name])   COPD (chronic obstructive pulmonary disease) ([name])   HLD (hyperlipidemia)   Absolute anemia   White coat syndrome with hypertension   AKI (acute kidney injury) ([place])   Acute metabolic encephalopathy   Acute respiratory distress   Low urine output   Somnolence   At risk for fluid volume overload   Free intraperitoneal air   Acute on chronic respiratory failure ([name])   Pressure injury of skin   Aspiration pneumonia ([name])   Wound dehiscence, surgical, initial encounter   Scheduled Meds: . chlorhexidine  15 mL Mouth Rinse BID  . enalaprilat  1.25 mg Intravenous Q6H  . enoxaparin (LOVENOX) injection  40 mg Subcutaneous Q24H  . fentaNYL  25 mcg Transdermal Q72H  . furosemide  20 mg Intravenous Daily  . insulin aspart  0-20 Units Subcutaneous Q4H  . ipratropium-albuterol  3 mL Nebulization TID  . mouth rinse  15 mL Mouth Rinse q12n4p  . metoprolol tartrate  10 mg Intravenous Q6H  . sodium chloride flush  10-40 mL Intracatheter Q12H   Continuous Infusions: . sodium chloride Stopped (02/27/18 1447)  . sodium chloride 10 mL/hr at 02/28/18 1851  . acetaminophen Stopped (03/01/18 0308)  . potassium chloride 10 mEq (03/01/18 0947)  . TPN ADULT (ION) 75 mL/hr at 02/28/18 1851  . TPN ADULT (ION)     PRN Meds:.sodium chloride, sodium  chloride, fentaNYL (SUBLIMAZE) injection, hydrALAZINE, iopamidol, labetalol, ondansetron (ZOFRAN) IV, sodium chloride flush  Review of Systems: Review of Systems  Unable to perform ROS: Intubated    Allergies  Allergen Reactions  . Oxycodone     confusion    OBJECTIVE: Vitals:   03/01/18 0717 03/01/18 0800 03/01/18 0900 03/01/18 0914  BP:  (!) 171/78 (!) 181/89   Pulse:  100 (!) 105 (!) 107  Resp:  (!) 22 (!) 26 17  Temp: 98.9 F (37.2 C)     TempSrc: Oral     SpO2:  96% 98% 96%  Weight:      Height:       Body mass index is 21.52 kg/m.  Physical Exam  Constitutional:  She remains alert but was more confused and restless overnight.  He was pulling at her lines and tubes.  Cardiovascular: Normal rate, regular rhythm and normal heart sounds.  No murmur heard. Pulmonary/Chest: Effort normal. She has no wheezes. She has no rales.  She is using trach collar oxygen.    Abdominal: Soft. She exhibits no distension. There is no tenderness.  She has a VAC wound dressing in place.  Her JP drain bulb is empty.  She has 20 cc of fluid out yesterday.  Skin: No rash noted.    Lab Results Lab Results  Component Value Date   WBC 9.9 03/01/2018   HGB 9.6 (L) 03/01/2018  HCT 29.1 (L) 03/01/2018   MCV 76.6 (L) 03/01/2018   PLT 300 03/01/2018    Lab Results  Component Value Date   CREATININE 0.38 (L) 03/01/2018   BUN 14 03/01/2018   NA 137 03/01/2018   K 3.4 (L) 03/01/2018   CL 100 03/01/2018   CO2 29 03/01/2018    Lab Results  Component Value Date   ALT 14 03/01/2018   AST 22 03/01/2018   ALKPHOS 114 03/01/2018   BILITOT 0.4 03/01/2018     Microbiology: No results found for this or any previous visit (from the past 240 hour(s)).  Michel Bickers, MD [facility] [phone] pager   [phone] cell 03/01/2018, 9:51 AM

## 2018-03-01 NOTE — Progress Notes (Addendum)
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  She is completed 3 cycles of chemotherapy and is now status post bilateral salpingo-oophorectomy and omentectomy with debulking on 8/22.   8/26 transferred to ICU w/ concern for perforation w/ free air noted.  Imipenem started. Went for exploratory laparotomy with washout.  No significant contamination identified, presumed spontaneously closed microperf. Returned with abdominal drains postoperative chest x-ray showing evidence of aspiration pneumonia; mixed respiratory and metabolic acidosis & in septic shock. Required intubation.   8/27 GI medicine consulted, did not feel she had overt GIB.  Rec's prophylaxis with PPI.  Signed off.  Weaning pressors. Hgb dropped from 8.8 to 7.4; got 2 units of blood,  febrile, added. vancomycin.  Neupogen given for neutropenia.   8/28-9/1 Shock resolved, TPN started. Fevers. Delirium barrier to weaning.  ABX for fever  9/02-9/6 Delirium. Sedation difficult, diuresing, remains on abx. spiking temps, loculated fluid collection identified in left pelvis. Not amedable to drainage.  ID consulted. Started seroquel. 9/4. Upper 2 drains removed. Staples removed.from incision site.  9/4-9/9 ID consulted by family.  Fevers ongoing, CT abd with improved fluid collections.  Failing PSV wean.  No pressors.  No sedation.   Husband deferring decisions about care to son (newly married).   9/10  Sister at bedside - says she is not in favor of trach but says is the decision of son/dpoa. Son met with Dr Jo Parker for goals of care - current status precludes chemo but goal is trach and full code and full medical care and later chemo when ECOG improves.  Low grade fever persists - ID following. More awake today.  No Perf leak on CT abd and restarted TF  9/12-9/14  s/p Trach - Dr Jo Parker. Levophed post trach.  Fevers. 1 unit PRBC,   9/14 - TRH consulted for medical management   9/18 - CT abdomen with ileus   9/22 - Off Vent completely, tolerating well ATC   Subjective: Patient seen and examined, she continues to improve, she is tolerating trach on room air.  Her blood pressure remains significantly elevated.  Patient is sitting up in chair, smiling and denies any abdominal pain, nausea and vomiting.  She report passing gas and is having some bowel movement on Flexi-Seal rectal tube. Remains with high output on NG tube  Assessment & Plan:   Principal Problem:   Pelvic abscess in female Active Problems:   Primary peritoneal carcinomatosis ([place])   Severe protein-calorie malnutrition ([name])   Severe sepsis ([name])   COPD (chronic obstructive pulmonary disease) ([name])   HLD (hyperlipidemia)   Absolute anemia   White coat syndrome with hypertension   Ovarian cancer ([place])   AKI (acute kidney injury) ([place])   Acute metabolic encephalopathy   Acute respiratory distress   Low urine output   Somnolence   At risk for fluid volume overload   Free intraperitoneal air   Acute on chronic respiratory failure ([name])   Pressure injury of skin   Status post bilateral salpingo-oophorectomy   Peritonitis ([place])   HCAP (healthcare-associated pneumonia)   Postoperative wound infection   Aspiration pneumonia ([name])   Wound dehiscence, surgical, initial encounter  Acute on chronic hypoxemic respiratory failure status post trach (9/12) - continues to improve In setting of aspiration pneumonia/ARDS. Right-sided pleural effusion, per PCCM no  significant pocket to drain on bedside ultrasound.  On collar trach at room air. Continue pulmonary hygiene as able. Continue trach management per pulmonary team. Trial of PMV as tolerated.  Possible downsize trach collar on 9/25.  Status post exploratory laparotomy for free air  on 8/26; following bilateral salpingo-oophorectomy, omentectomy and debulking surgery on 8/22. S/p I&D from wound dehiscence 9/19   Management per GYN/ONC  Pain control with fentanyl patch at 25 mcg every 72 hours, patient has not used IV fentanyl for over 12 hours will decrease to 12.5 mg as needed.  Continue with IV Tylenol  Abdominal pain/Ileus  Abdominal x-ray this morning show some improvement of ileus, NG tube with moderate output, will continue n.p.o. patient passing gas and having bowel movement, out of bed as tolerated, keep K >4 and Mag >2.  Check abdominal x-ray in a.m. if continues to clinically improve can clamp NG tube in a.m. and follow clinically.  Severe sepsis pelvic abscess, peritonitis - sepsis physiology resolved Initially with peritonitis (microperforation) subsequently complicated by pneumonia. Completed antibiotic therapy with Unasyn, Eraxis and vancomycin for 1 month.  Antibiotics were stopped on 9/23. Continue to monitor.    HTN  BP slightly improved but remains above goal, she is on max IV metoprolol and IV Vasotec.  Will add clonidine patch 0.2 mg, this would likely to help with anxiety as well.  Continue hydralazine and labetalol as needed. Echo LVEF 65 to 70% moderate LVH, grade 1 diastolic dysfunction. Patient also on Lasix 20 mg IV daily.   Severe protein calorie malnutrition Continue to hold tube feedings due to ileus, likely malabsorption  Continue TPN and monitor electrolytes   Postoperative anemia - hemoglobin stable and trending up Status post 4 unit PRBCs during hospital stay, Hgb stable  Transfuse if hemoglobin less than 7.  Hypokalemia/hypomagnesemia Replete as needed, keep K > 4 and mag > 2  Acute metabolic encephalopathy  Back with ICU delirium in setting of critical illness.  Unfortunately not able to use Seroquel which patient tolerated early in the hospital stay and responded well to due to n.p.o. status.  Will give trial of risperidone  disintegrating tablet, monitor for aspiration.  Keep Haldol IV as needed in case of agitation.  When patient able to use oral route resume Seroquel and Celexa.  Deconditioning  Patient will benefit from LTAC  Continue PT per ICU protocol  Out of bed as tolerated.  Code Status: Full Code  Family Communication: Husband at bedside  DVT prophylaxis: Lovenox   Procedures:     Antimicrobials: Anti-infectives (From admission, onward)   Start     Dose/Rate Route Frequency Ordered Stop   02/22/18 1400  vancomycin (VANCOCIN) IVPB 750 mg/150 ml premix  Status:  Discontinued     750 mg 150 mL/hr over 60 Minutes Intravenous Every 24 hours 02/22/18 1335 02/22/18 1337   02/22/18 1400  vancomycin (VANCOCIN) IVPB 1000 mg/200 mL premix  Status:  Discontinued     1,000 mg 200 mL/hr over 60 Minutes Intravenous Every 24 hours 02/22/18 1337 02/28/18 1622   02/22/18 1200  vancomycin (VANCOCIN) IVPB 750 mg/150 ml premix  Status:  Discontinued     750 mg 150 mL/hr over 60 Minutes Intravenous Every 24 hours 02/22/18 0903 02/22/18 1243   02/17/18 1200  vancomycin (VANCOCIN) IVPB 750 mg/150 ml premix  Status:  Discontinued     750 mg 150 mL/hr over 60 Minutes Intravenous Every 24 hours 02/16/18 1518 02/22/18 0521   02/16/18 1800  Ampicillin-Sulbactam (  UNASYN) 3 g in sodium chloride 0.9 % 100 mL IVPB  Status:  Discontinued     3 g 200 mL/hr over 30 Minutes Intravenous Every 6 hours 02/16/18 1619 02/28/18 1622   02/07/18 1200  vancomycin (VANCOCIN) IVPB 1000 mg/200 mL premix  Status:  Discontinued     1,000 mg 200 mL/hr over 60 Minutes Intravenous Every 24 hours 02/06/18 1315 02/16/18 1518   02/06/18 1015  vancomycin (VANCOCIN) IVPB 1000 mg/200 mL premix     1,000 mg 200 mL/hr over 60 Minutes Intravenous STAT 02/06/18 1012 02/06/18 1211   02/02/18 1000  anidulafungin (ERAXIS) 100 mg in sodium chloride 0.9 % 100 mL IVPB  Status:  Discontinued     100 mg 78 mL/hr over 100 Minutes Intravenous Every 24 hours  02/01/18 0916 02/28/18 1622   02/02/18 1000  vancomycin (VANCOCIN) IVPB 1000 mg/200 mL premix  Status:  Discontinued     1,000 mg 200 mL/hr over 60 Minutes Intravenous Every 24 hours 02/01/18 1039 02/04/18 0852   02/01/18 1000  anidulafungin (ERAXIS) 200 mg in sodium chloride 0.9 % 200 mL IVPB     200 mg 78 mL/hr over 200 Minutes Intravenous  Once 02/01/18 0916 02/01/18 1602   02/01/18 0830  vancomycin (VANCOCIN) 1,250 mg in sodium chloride 0.9 % 250 mL IVPB     1,250 mg 166.7 mL/hr over 90 Minutes Intravenous STAT 02/01/18 0822 02/01/18 1035   01/31/18 1800  meropenem (MERREM) 1 g in sodium chloride 0.9 % 100 mL IVPB  Status:  Discontinued     1 g 200 mL/hr over 30 Minutes Intravenous Every 8 hours 01/31/18 1012 02/16/18 1619   01/31/18 0900  meropenem (MERREM) 1 g in sodium chloride 0.9 % 100 mL IVPB     1 g 200 mL/hr over 30 Minutes Intravenous STAT 01/31/18 0852 01/31/18 1005   01/27/18 0600  cefOXitin (MEFOXIN) 2 g in sodium chloride 0.9 % 100 mL IVPB     2 g 200 mL/hr over 30 Minutes Intravenous On call to O.R. 01/27/18 0533 01/27/18 0753       Objective: Vitals:   03/01/18 1142 03/01/18 1200 03/01/18 1300 03/01/18 1400  BP:   (!) 196/88 (!) 206/106  Pulse:  (!) 103 (!) 111 (!) 117  Resp:  (!) 28 (!) 32 (!) 30  Temp: 98.4 F (36.9 C)     TempSrc: Oral     SpO2:  97% 97% 97%  Weight:      Height:        Intake/Output Summary (Last 24 hours) at 03/01/2018 1517 Last data filed at 03/01/2018 1500 Gross per 24 hour  Intake 3273.75 ml  Output 1495 ml  Net 1778.75 ml   Filed Weights   02/27/18 0500 02/28/18 0500 03/01/18 0500  Weight: 57 kg 54.6 kg 55.1 kg    Examination:  General: NAD, sitting up in chair Cardiovascular: RRR, S1/S2 +, no rubs, no gallops Respiratory: Decrease breath sounds, air entry has improved, mild rhonchi, on PSM  Abdominal: Soft less distended, NGT in place moderate output, + BS. Non tender  CNS: Confused, answer some questions  appropriately, but did not recognized husband.   Extremities: no edema Skin: Wound vac in place abd midline, warm and dry   Data Reviewed: I have personally reviewed following labs and imaging studies  CBC: Recent Labs  Lab 02/25/18 0455 02/26/18 0620 02/27/18 0455 02/28/18 0459 03/01/18 0240  WBC 9.3 9.9 9.2 9.3 9.9  NEUTROABS 6.8 7.3 6.6 6.7 7.3  HGB 8.9* 9.1* 9.0* 8.8* 9.6*  HCT 29.3* 28.8* 28.2* 27.2* 29.1*  MCV 82.3 80.7 80.3 78.6 76.6*  PLT 347 329 291 282 545   Basic Metabolic Panel: Recent Labs  Lab 02/26/18 0454 02/26/18 0620 02/27/18 0455 02/28/18 0459 03/01/18 0240  NA 133* 144 140 136 137  K 4.7 2.8* 3.0* 3.1* 3.4*  CL 95* 103 102 99 100  CO2 _0 GLUCOSE 855* 147* 140* 121* 141*  BUN _1 CREATININE 0.56 0.44 0.47 0.39* 0.38*  CALCIUM 8.4* 8.6* 8.4* 8.1* 8.3*  MG 2.3 1.8 1.8 1.7 1.9  PHOS 6.7* 3.8 3.8 3.3 3.1   GFR: Estimated Creatinine Clearance: 52.6 mL/min (A) (by C-G formula based on SCr of 0.38 mg/dL (L)). Liver Function Tests: Recent Labs  Lab 02/23/18 0645 02/25/18 0455 02/28/18 0459 03/01/18 0240  AST _2 ALT _3 ALKPHOS 91 79 94 114  BILITOT 0.6 0.8 0.2* 0.4  PROT 7.6 7.5 6.4* 6.9  ALBUMIN 1.9* 2.0* 1.7* 1.8*   No results for input(s): LIPASE, AMYLASE in the last 168 hours. No results for input(s): AMMONIA in the last 168 hours. Coagulation Profile: No results for input(s): INR, PROTIME in the last 168 hours. Cardiac Enzymes: No results for input(s): CKTOTAL, CKMB, CKMBINDEX, TROPONINI in the last 168 hours. BNP (last 3 results) No results for input(s): PROBNP in the last 8760 hours. HbA1C: No results for input(s): HGBA1C in the last 72 hours. CBG: Recent Labs  Lab 02/28/18 2036 02/28/18 2310 03/01/18 0243 03/01/18 0732 03/01/18 1133  GLUCAP 97 137* 142* 139* 136*   Lipid Profile: Recent Labs    02/28/18 0459  TRIG 108   Thyroid Function Tests: No results for input(s): TSH,  T4TOTAL, FREET4, T3FREE, THYROIDAB in the last 72 hours. Anemia Panel: No results for input(s): VITAMINB12, FOLATE, FERRITIN, TIBC, IRON, RETICCTPCT in the last 72 hours. Sepsis Labs: No results for input(s): PROCALCITON, LATICACIDVEN in the last 168 hours.  No results found for this or any previous visit (from the past 240 hour(s)).   Radiology Studies: Dg Abd 1 View  Result Date: 02/28/2018 CLINICAL DATA:  Nasogastric tube placement. EXAM: ABDOMEN - 1 VIEW COMPARISON:  None. FINDINGS: Nasogastric tube with the tip projecting over the stomach. Right lower lobe airspace disease. There is no bowel dilatation to suggest obstruction. There is no evidence of pneumoperitoneum, portal venous gas or pneumatosis. There are no pathologic calcifications along the expected course of the ureters. The osseous structures are unremarkable. IMPRESSION: 1. Nasogastric tube with the tip projecting over the stomach. 2. Right lower lobe airspace disease concerning for pneumonia. Electronically Signed   By: Kathreen Devoid   On: 02/28/2018 12:30   Dg Abd Portable 1v  Result Date: 02/28/2018 CLINICAL DATA:  Ileus EXAM: PORTABLE ABDOMEN - 1 VIEW COMPARISON:  02/26/2018 FINDINGS: Scattered large and small bowel gas is again identified. The degree of gaseous distention has improved somewhat in the interval particularly in the small bowel. Gas is again noted throughout the colon. Nasogastric catheter is seen in satisfactory position. No free air is noted. No acute bony abnormality is seen. IMPRESSION: Changes consistent with ileus although mildly improved when compared with the prior exam. Electronically Signed   By: Inez Catalina M.D.   On: 02/28/2018 07:27     Scheduled Meds: . chlorhexidine  15 mL Mouth Rinse BID  . enalaprilat  1.25 mg Intravenous Q6H  . enoxaparin (  LOVENOX) injection  40 mg Subcutaneous Q24H  . fentaNYL  25 mcg Transdermal Q72H  . furosemide  20 mg Intravenous Daily  . insulin aspart  0-20 Units  Subcutaneous Q4H  . ipratropium-albuterol  3 mL Nebulization TID  . mouth rinse  15 mL Mouth Rinse q12n4p  . metoprolol tartrate  10 mg Intravenous Q6H  . sodium chloride flush  10-40 mL Intracatheter Q12H   Continuous Infusions: . sodium chloride Stopped (02/27/18 1447)  . sodium chloride 10 mL/hr at 03/01/18 1500  . TPN ADULT (ION) 75 mL/hr at 03/01/18 1500  . TPN ADULT (ION)       LOS: 33 days    Time spent: Total of 25 minutes spent with pt, greater than 50% of which was spent in discussion of  treatment, counseling and coordination of care  Chipper Oman, MD Pager: Text Page via [email or web address]   If 7PM-7AM, please contact night-coverage [email or web address] 03/01/2018, 3:17 PM   Note - This record has been created using [employer]. Chart creation errors have been sought, but may not always have been located. Such creation errors do not reflect on the standard of medical care.

## 2018-03-01 NOTE — Progress Notes (Signed)
  Speech Language Pathology Treatment: Jo Parker Speaking valve  Patient Details Name: Jo Parker MRN: [id number] DOB: Dec 02, 1945 Today's Date: 03/01/2018 Time: 4098-1191 SLP Time Calculation (min) (ACUTE ONLY): 20 min  Assessment / Plan / Recommendation Clinical Impression  Pt sitting upright in chair with her 2 sisters present and PMSV in place upon SLP arrival.  RR and HR noted to be significantly elevated despite max cues to slow rate.  Pt did verbalize x 2 only during 20 minute session with total cues with single or two words per breath group.  Respiratory support severely decreased due to her deconditioning.  Her vocal quality/strength however is improved compared to this SLP visit on 02/24/2018 which is encouraging.  Removal of valve x2 during session without indication of breath stacking.   Cough x1 noted during session without pt ability to expectorate orally and thus she swallowed secretions.    Per RN, pt may get her trach downsized with cuffless trach tomorrow.  Hopefully this will help improve pt's tolerance of her PMSV allowing more subglottic flow of air.  Educated patient, Jo Parker and Jo Parker   (2 sisters) to use of PMSV and precautions.    SLP removed PMSV per pt request = and increased RR to high 30s noted, with removal her respiratory rate improved to mid 20s.    Will continue to follow to help maximize pt's tolerance, respiratory and phonatory coordination.  Thanks for allowing me to help with this pt's care plan.  Posted signs in room regarding PMSV usage.    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  She is completed 3 cycles of chemotherapy and is now status post bilateral salpingo-oophorectomy and omentectomy with debulking on 8/22.   Post op complication for possible bowel obstruction and presumed aspiration during intubation.   Acute on chronic hypoxemic respiratory failure requiring prolonged intubation  (Pt was intubated 8/26 and tracheostomy was placed 9/14) and now tracheostomy, tolerating TCT and no longer requiring ventilator use.  PMSV trials started last week - with improved tolerance Friday 02/25/2018.   Pt on 28% trach collar.  2 sisters present today including Jo Parker and Jo Parker.  Pt wearing valve upon SLP entrance to room.        SLP Plan  Continue with current plan of care       Recommendations         Patient may use Passy-Muir Speech Valve: Intermittently with supervision(with full supervision only) PMSV Supervision: Full MD: Please consider changing trach tube to : Smaller size;Cuffless(RN reports pt may get smaller cuffless trach placed tomorrow - suspect this may improve her tolerance of valve)         Follow up Recommendations: LTACH SLP Visit Diagnosis: Aphonia (R49.1) Plan: Continue with current plan of care       GO                Jo Parker 03/01/2018, 5:47 PM   Jo Parker, [facility] SLP Acute Rehab Services Pager [phone] Office [phone]

## 2018-03-02 ENCOUNTER — Inpatient Hospital Stay (HOSPITAL_COMMUNITY): Payer: Medicare PPO

## 2018-03-02 LAB — COMPREHENSIVE METABOLIC PANEL
ALK PHOS: 119 U/L (ref 38–126)
ALT: 13 U/L (ref 0–44)
ANION GAP: 7 (ref 5–15)
AST: 22 U/L (ref 15–41)
Albumin: 1.9 g/dL — ABNORMAL LOW (ref 3.5–5.0)
BILIRUBIN TOTAL: 0.3 mg/dL (ref 0.3–1.2)
BUN: 18 mg/dL (ref 8–23)
CO2: 27 mmol/L (ref 22–32)
Calcium: 8.6 mg/dL — ABNORMAL LOW (ref 8.9–10.3)
Chloride: 104 mmol/L (ref 98–111)
Creatinine, Ser: 0.5 mg/dL (ref 0.44–1.00)
GFR calc Af Amer: >60 mL/min (ref >60)
Glucose, Bld: 185 mg/dL — ABNORMAL HIGH (ref 70–99)
POTASSIUM: 4.2 mmol/L (ref 3.5–5.1)
Sodium: 138 mmol/L (ref 135–145)
TOTAL PROTEIN: 6.7 g/dL (ref 6.5–8.1)

## 2018-03-02 LAB — GLUCOSE, CAPILLARY
GLUCOSE-CAPILLARY: 125 mg/dL — AB (ref 70–99)
GLUCOSE-CAPILLARY: 137 mg/dL — AB (ref 70–99)
GLUCOSE-CAPILLARY: 112 mg/dL — AB (ref 70–99)
Glucose-Capillary: 156 mg/dL — ABNORMAL HIGH (ref 70–99)
Glucose-Capillary: 145 mg/dL — ABNORMAL HIGH (ref 70–99)

## 2018-03-02 LAB — CBC WITH DIFFERENTIAL/PLATELET
BASOS ABS: 0 10*3/uL (ref 0.0–0.1)
BASOS PCT: 0 %
EOS PCT: 1 %
Eosinophils Absolute: 0.1 10*3/uL (ref 0.0–0.7)
HEMATOCRIT: 27.7 % — AB (ref 36.0–46.0)
Hemoglobin: 9.1 g/dL — ABNORMAL LOW (ref 12.0–15.0)
LYMPHS ABS: 1.7 10*3/uL (ref 0.7–4.0)
Lymphocytes Relative: 19 %
MCH: 25.1 pg — AB (ref 26.0–34.0)
MCHC: 32.9 g/dL (ref 30.0–36.0)
MCV: 76.5 fL — AB (ref 78.0–100.0)
Monocytes Absolute: 0.6 10*3/uL (ref 0.1–1.0)
Monocytes Relative: 7 %
NEUTROS ABS: 6.6 10*3/uL (ref 1.7–7.7)
Neutrophils Relative %: 73 %
Platelets: 314 10*3/uL (ref 150–400)
RBC: 3.62 MIL/uL — ABNORMAL LOW (ref 3.87–5.11)
RDW: 20.9 % — AB (ref 11.5–15.5)
WBC: 9 10*3/uL (ref 4.0–10.5)

## 2018-03-02 LAB — PHOSPHORUS: Phosphorus: 3.7 mg/dL (ref 2.5–4.6)

## 2018-03-02 LAB — MAGNESIUM: MAGNESIUM: 1.8 mg/dL (ref 1.7–2.4)

## 2018-03-02 MED ORDER — LORAZEPAM 2 MG/ML IJ SOLN: 0.5000 mg | Freq: Once | INTRAMUSCULAR | Status: DC

## 2018-03-02 MED ORDER — TRAVASOL 10 % IV SOLN
INTRAVENOUS | Status: AC
  Administered 2018-03-02: 18:00:00 via INTRAVENOUS
  Filled 2018-03-02: qty 900

## 2018-03-02 MED ORDER — MAGNESIUM SULFATE 2 GM/50ML IV SOLN
2.0000 g | Freq: Once | INTRAVENOUS | Status: AC
  Administered 2018-03-02: 2 g via INTRAVENOUS
  Filled 2018-03-02: qty 50

## 2018-03-02 NOTE — Evaluation (Addendum)
SLP Cancellation Note  Patient Details Name: Jo Parker MRN: [id number] DOB: 1945-10-19   Cancelled treatment:       Reason Eval/Treat Not Completed: (pt for downsizing of trach today, received order for swallow evaluation - will follow up after trach downsized and NG removed, thanks)   Macario Golds 03/02/2018, 9:41 AM  Luanna Salk, MS [facility] SLP [place] Pager [phone] Office [phone]  Spoke to Cuney NP who requested SLP to proceed with swallow evaluation after trach downsized.  Will complete as ordered.  Thanks.  During 2nd conversation with Laurey Arrow NP today he advised can defer swallow eval until next date with hopes of NG being removed.  Thanks.

## 2018-03-02 NOTE — Progress Notes (Signed)
[place] CONSULT NOTE   Pharmacy Consult for TPN Indication: ileus  Patient Measurements: Height: 5\' 3"  (160 cm) Weight: 121 lb 0.5 oz (54.9 kg) IBW/kg (Calculated) : 52.4 TPN AdjBW (KG): 58.3 Body mass index is 21.44 kg/m.  Body mass index is 19.53 kg/m. Filed Weights   02/28/18 0500 03/01/18 0500 03/02/18 0400  Weight: 120 lb 5.9 oz (54.6 kg) 121 lb 7.6 oz (55.1 kg) 110 lb 3.7 oz (50 kg)    HPI: Pt is a 72 year old with ovarian cancer status post bilateral salpingo-oophorectomy and omentectomy with debulking on 8/22. Ex lap on 8/26 and transferred to ICU afterward. Remains critically ill on vent support, now s/p trach placement. Was previously on TPN 8/27-9/5, however has lately had issues tolerating enteral nutrition. TFs held since 9/17 d/t abdominal distention; CT abd shows persistent ileus. NG output remains high. [facility] resuming TPN per pharmacy on 9/19.  Significant events:  - 8/27 Start TPN. Hold lipids for first 7 days while pt in ICU  - 8/30 scheduled Novolog insulin added q4h - 9/2 CT scan of abdomenshowsundrained left pelvic fluid collection, IR consulted - defer drainage for now - 9/4 Lipids started with TPN. Tube feeds (Vital AF 1.2 Cal) initiated at 61mL/hr, order to increase by 31mL/hr every 8 hours to goal rate of 52mL/hr - 9/5 TPN weaned off - 9/19 resume TPN  - Abd Xray 9/24 shows some improvement in ileus; passing gas & having BMs. Plan repeat 9/25 and consider clamping NGT  Insulin requirements past 24 hours: 20 units resistant SSI / 24 hrs  (no hx DM) Current Nutrition: NPO, TF held  IVF: NaCl KVO  Central access: 02/01/18 TPN start date: 8/27-9/5, restart 9/19  ASSESSMENT                                                                                                          Today:   Glucose:  CBGs 125-165 (slightly increasing with conversion to custom TPN)  Electrolytes: (Goals K >4, Mg >2) K improved to 4.2,  Mag 1.8, all others WNL  Lasix 20 mg IV daily  NG output 850 ml 9/24  Renal:  SCr 0.38, UOP 2.1L  LFTs:  WNL   TGs:  108 (9/23)  Prealbumin: 3.5 (9/2), 10.6 (9/20), 8.7 (9/23)   NUTRITIONAL GOALS                                                                                             RD recs (9/17):  82-93g Protein, 1750-1925 Kcal per day TPN providing: 90 g of protein and 1758 kCals per day meeting 100% of protein and 100% of kCal needs.  PLAN  Now: Mag sulfate 2g IV x 2  TPN at 54ml/hr at goal rate This TPN provides 90 g of protein, 270 g of dextrose, and 54 g of lipids for a total of 1818 kcals meeting 100 % of patient needs Electrolytes in TPN: Na 90 mEq, K 126 mEq, Ca 9 mEq, Mg 18 mEq, Phos 27 mmol, Cl 145.8 mEq (K increased from 50 mEq/L to 70 mEq/L 9/24) Cl:Ac 1:1 Contains standard MVI, trace elements, and famotidine 40 mg per day Continue resistant SSI and adjust as needed Monitor TPN labs, CMP, Mg, Phos in AM F/U daily  Peggyann Juba, PharmD, BCPS Pager: [phone] 03/02/2018 7:03 AM

## 2018-03-02 NOTE — Progress Notes (Signed)
During rounding on pt, NG tube was found lying on pt chest, the family at beside was not aware that she had pulled it out. Jo John, NP was called and said to leave it out for now, but if pt starts vomiting to page Dr. Gerarda Fraction tonight.

## 2018-03-02 NOTE — Progress Notes (Signed)
Received phone call from RN that NG tube had been pulled out before NG residual had been checked.  Per Dr. Gerarda Fraction, leave NG tube out and remain NPO.

## 2018-03-02 NOTE — Progress Notes (Signed)
OT Cancellation Note  Patient Details Name: Jo Parker MRN: [id number] DOB: 04-Oct-1945   Cancelled Treatment:    Reason Eval/Treat Not Completed: Fatigue/lethargy limiting ability to participate  Spoke with RN and pt has not been interactive this day Jo Parker, [place] [phone] Office- [phone], Jo Parker 03/02/2018, 2:42 PM

## 2018-03-02 NOTE — Progress Notes (Signed)
Patient ID: Jo Parker, female   DOB: 01-18-1946, 72 y.o.   MRN: [id number]         [facility]  Date of Admission:  01/27/2018            ASSESSMENT: Her maximum temperature in the past 24 hours was 100.4 degrees.  I will continue observation off of antibiotics.  PLAN: 1. Observe off of antibiotics  Principal Problem:   Pelvic abscess in female Active Problems:   Severe sepsis ([place])   Peritonitis ([place])   HCAP (healthcare-associated pneumonia)   Postoperative wound infection   Primary peritoneal carcinomatosis ([place])   Ovarian cancer ([place])   Status post bilateral salpingo-oophorectomy   Severe protein-calorie malnutrition ([name])   COPD (chronic obstructive pulmonary disease) ([name])   HLD (hyperlipidemia)   Absolute anemia   White coat syndrome with hypertension   AKI (acute kidney injury) ([place])   Acute metabolic encephalopathy   Acute respiratory distress   Low urine output   Somnolence   At risk for fluid volume overload   Free intraperitoneal air   Acute on chronic respiratory failure ([name])   Pressure injury of skin   Aspiration pneumonia ([name])   Wound dehiscence, surgical, initial encounter   Scheduled Meds: . chlorhexidine  15 mL Mouth Rinse BID  . cloNIDine  0.2 mg Transdermal Weekly  . enalaprilat  1.25 mg Intravenous Q6H  . enoxaparin (LOVENOX) injection  40 mg Subcutaneous Q24H  . fentaNYL  25 mcg Transdermal Q72H  . furosemide  20 mg Intravenous Daily  . insulin aspart  0-20 Units Subcutaneous Q4H  . ipratropium-albuterol  3 mL Nebulization TID  . LORazepam  0.5 mg Intravenous Once  . mouth rinse  15 mL Mouth Rinse q12n4p  . metoprolol tartrate  10 mg Intravenous Q6H  . risperiDONE  0.5 mg Oral BID  . sodium chloride flush  10-40 mL Intracatheter Q12H   Continuous Infusions: . sodium chloride 10 mL/hr at 03/02/18 1102  . sodium chloride 10 mL/hr at 03/02/18 1102  . TPN ADULT (ION) 75 mL/hr at 03/02/18 1102  . TPN  ADULT (ION)     PRN Meds:.sodium chloride, sodium chloride, fentaNYL (SUBLIMAZE) injection, hydrALAZINE, iopamidol, labetalol, ondansetron (ZOFRAN) IV, sodium chloride flush  Review of Systems: Review of Systems  Unable to perform ROS: Intubated    Allergies  Allergen Reactions  . Oxycodone     confusion    OBJECTIVE: Vitals:   03/02/18 1205 03/02/18 1300 03/02/18 1346 03/02/18 1400  BP:  (!) 169/74  (!) 180/66  Pulse: (!) 121 94  (!) 105  Resp: (!) 27 (!) 26  (!) 38  Temp:      TempSrc:      SpO2: 95% 96% 98% 95%  Weight:      Height:       Body mass index is 19.53 kg/m.  Physical Exam  Constitutional:  She is alert and calm today.  Cardiovascular: Normal rate, regular rhythm and normal heart sounds.  No murmur heard. Pulmonary/Chest: Effort normal. She has no wheezes. She has no rales.    Abdominal: Soft. She exhibits no distension. There is no tenderness.  She has a VAC wound dressing in place.  Her JP drain bulb is empty.  She has 5 cc of fluid out yesterday.  Picture taken this morning during dressing change showed that her wound was beefy red and granulating well.  Skin: No rash noted.    Lab Results Lab Results  Component Value  Date   WBC 9.0 03/02/2018   HGB 9.1 (L) 03/02/2018   HCT 27.7 (L) 03/02/2018   MCV 76.5 (L) 03/02/2018   PLT 314 03/02/2018    Lab Results  Component Value Date   CREATININE 0.50 03/02/2018   BUN 18 03/02/2018   NA 138 03/02/2018   K 4.2 03/02/2018   CL 104 03/02/2018   CO2 27 03/02/2018    Lab Results  Component Value Date   ALT 13 03/02/2018   AST 22 03/02/2018   ALKPHOS 119 03/02/2018   BILITOT 0.3 03/02/2018     Microbiology: No results found for this or any previous visit (from the past 240 hour(s)).  Michel Bickers, MD [facility] [phone] pager   [phone] cell 03/02/2018, 3:01 PM

## 2018-03-02 NOTE — Progress Notes (Signed)
SLP Cancellation Note  Patient Details Name: Jo Parker MRN: [id number] DOB: Jul 14, 1945   Cancelled treatment:       Reason Eval/Treat Not Completed: Other (comment)(patient today is not responding to SLP much, acknowledges she is tired and does not want to try pmsv today.   Question if pt may be having some situational sadness/depression.    Granddaughter reports pt is lethargic and having pain, will continue efforts, explained to granddaughter and pt reasoning for holding swallow evaluation)   Macario Golds 03/02/2018, 2:00 PM  Luanna Salk, [facility] SLP [place] Pager [phone] Office [phone]

## 2018-03-02 NOTE — Care Management Note (Signed)
Case Management Note  Patient Details  Name: Jo Parker MRN: [id number] Date of Birth: [birth year]  Subjective/Objective:                  Continues to have temp=100.4, on r.a./Iv tpn,  Action/Plan: Still reviewing case with insurance to see if they will allow ltach admission.  Expected Discharge Date:  01/29/18               Expected Discharge Plan:  Home/Self Care  In-House Referral:     Discharge planning Services  CM Consult  Post Acute Care Choice:    Choice offered to:     DME Arranged:    DME Agency:     HH Arranged:    HH Agency:     Status of Service:  In process, will continue to follow  If discussed at Long Length of Stay Meetings, dates discussed:    Additional Comments:  Leeroy Cha, RN 03/02/2018, 8:56 AM

## 2018-03-02 NOTE — Progress Notes (Signed)
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 She is completed 3 cycles of chemotherapy and is now status post bilateral salpingo-oophorectomy and omentectomy with debulking on 8/22.   8/26 transferred to ICU w/ concern for perforation w/ free air noted. Imipenem started. Went for exploratory laparotomy with washout. No significant contamination identified, presumed spontaneously closed microperf. Returned with abdominal drains postoperative chest x-ray showing evidence of aspiration pneumonia; mixed respiratory and metabolic acidosis &in septic shock. Required intubation.   8/27 GI medicine consulted, did not feel she had overt GIB. Rec's prophylaxis with PPI. Signed off. Weaning pressors. Hgb dropped from 8.8 to 7.4; got 2 units of blood, febrile, added. vancomycin. Neupogen given for neutropenia.   8/28-9/1 Shock resolved, TPN started. Fevers. Delirium barrier to weaning. ABX for fever  9/02-9/6 Delirium. Sedation difficult, diuresing, remains on abx. spiking temps, loculated fluid collection identified in left pelvis. Not amedable to drainage. ID consulted. Started seroquel. 9/4. Upper 2 drains removed. Staples removed.from incision site.  9/4-9/9 ID consulted by family. Fevers ongoing, CT abd with improved fluid collections. Failing PSV wean. No pressors. No sedation. Husband deferring decisions about care to son (newly married).   9/10 Sister at bedside - says she is not in favor of trach but says is the decision of son/dpoa. Son met with Dr Alvy Bimler for goals of care - current status precludes chemo but goal is trach and full code and full medical care and later chemo when ECOG improves. Low grade fever persists - ID following. More awake today. No Perf leak on CT abd and restarted  TF  9/12-9/14s/p Trach - Dr Nelda Marseille. Levophed post trach. Fevers. 1 unit PRBC,   9/14 - TRH consulted for medical management   9/18 - CT abdomen with ileus   9/22 - Off Vent completely, tolerating well ATC    Assessment & Plan:   Principal Problem:   Pelvic abscess in female Active Problems:   Primary peritoneal carcinomatosis (Jo Parker)   Severe protein-calorie malnutrition (Jo Parker)   Severe sepsis (Jo Parker)   COPD (chronic obstructive pulmonary disease) (Jo Parker)   HLD (hyperlipidemia)   Absolute anemia   White coat syndrome with hypertension   Ovarian cancer (Jo Parker)   AKI (acute kidney injury) (Jo Parker)   Acute metabolic encephalopathy   Acute respiratory distress   Low urine output   Somnolence   At risk for fluid volume overload   Free intraperitoneal air   Acute on chronic respiratory failure (Jo Parker)   Pressure injury of skin   Status post bilateral salpingo-oophorectomy   Peritonitis (Jo Parker)   HCAP (healthcare-associated pneumonia)   Postoperative wound infection   Aspiration pneumonia (Jo Parker)   Wound dehiscence, surgical, initial encounter  Acute on chronic hypoxemic respiratory failure status post trach (9/12) - continues to improve -In setting of aspiration pneumonia/ARDS.  -Noted to have right-sided pleural effusion, per PCCM no significant pocket to drain on bedside ultrasound.  -PCCM following with plans for downsizing trach today  Status post exploratory laparotomy for free air on 8/26; following bilateral salpingo-oophorectomy, omentectomy and debulking surgery on 8/22. S/p I&D from wound dehiscence 9/19   -Continue with management per GYN/ONC  -Pain control with fentanyl patch at 25 mcg every 72 hours, patient has not used IV fentanyl for over 12 hours will decrease to 12.5 mg as needed.   Abdominal pain/Ileus  -Surgical service  following -Chart reviewed. Patient pulled out NG today  Severe sepsis pelvic abscess, peritonitis -  -sepsis physiology  resolved -Initially with peritonitis (microperforation) subsequently complicated by pneumonia. -Completed antibiotic therapy with Unasyn, Eraxis and vancomycin for 1 month.   -Antibiotics were stopped on 9/23. Continue to monitor.    HTN  -BP overall stable, currently on max IV metoprolol and IV Vasotec.   -Clonidine patch was added -Continue hydralazine and labetalol as needed.  -Echo LVEF 65 to 70% moderate LVH, grade 1 diastolic dysfunction.  -Continued on Lasix 20 mg IV daily.   Severe protein calorie malnutrition -Continue to hold tube feedings due to ileus, likely malabsorption  -Continue TPN and monitor electrolytes   Postoperative anemia - hemoglobin stable and trending up -Status post 4 unit PRBCs during hospital stay, Hgb stable  -Transfuse if hemoglobin less than 7.  Hypokalemia/hypomagnesemia -Replete as needed, keep K > 4 and mag > 2  Acute metabolic encephalopathy  Back with ICU delirium in setting of critical illness.  Unfortunately not able to use Seroquel which patient tolerated early in the hospital stay and responded well to due to n.p.o. status.  Will give trial of risperidone disintegrating tablet, monitor for aspiration.  Keep Haldol IV as needed in case of agitation.  When patient able to use oral route resume Seroquel and Celexa.  Deconditioning  Patient will benefit from LTAC  Continue PT per ICU protocol  Out of bed as tolerated.  DVT prophylaxis: Lovenox subQ Code Status: Full Family Communication: Pt in room, family at bedside Disposition Plan: Uncertain at this time  Procedures:     Antimicrobials: Anti-infectives (From admission, onward)   Start     Dose/Rate Route Frequency Ordered Stop   02/22/18 1400  vancomycin (VANCOCIN) IVPB 750 mg/150 ml premix  Status:  Discontinued     750 mg 150 mL/hr over 60 Minutes Intravenous Every 24 hours 02/22/18 1335 02/22/18 1337   02/22/18 1400  vancomycin (VANCOCIN) IVPB 1000 mg/200 mL premix   Status:  Discontinued     1,000 mg 200 mL/hr over 60 Minutes Intravenous Every 24 hours 02/22/18 1337 02/28/18 1622   02/22/18 1200  vancomycin (VANCOCIN) IVPB 750 mg/150 ml premix  Status:  Discontinued     750 mg 150 mL/hr over 60 Minutes Intravenous Every 24 hours 02/22/18 0903 02/22/18 1243   02/17/18 1200  vancomycin (VANCOCIN) IVPB 750 mg/150 ml premix  Status:  Discontinued     750 mg 150 mL/hr over 60 Minutes Intravenous Every 24 hours 02/16/18 1518 02/22/18 0521   02/16/18 1800  Ampicillin-Sulbactam (UNASYN) 3 g in sodium chloride 0.9 % 100 mL IVPB  Status:  Discontinued     3 g 200 mL/hr over 30 Minutes Intravenous Every 6 hours 02/16/18 1619 02/28/18 1622   02/07/18 1200  vancomycin (VANCOCIN) IVPB 1000 mg/200 mL premix  Status:  Discontinued     1,000 mg 200 mL/hr over 60 Minutes Intravenous Every 24 hours 02/06/18 1315 02/16/18 1518   02/06/18 1015  vancomycin (VANCOCIN) IVPB 1000 mg/200 mL premix     1,000 mg 200 mL/hr over 60 Minutes Intravenous STAT 02/06/18 1012 02/06/18 1211   02/02/18 1000  anidulafungin (ERAXIS) 100 mg in sodium chloride 0.9 % 100 mL IVPB  Status:  Discontinued     100 mg 78 mL/hr over 100 Minutes Intravenous Every 24 hours 02/01/18 0916 02/28/18 1622   02/02/18 1000  vancomycin (VANCOCIN) IVPB 1000 mg/200 mL premix  Status:  Discontinued     1,000  mg 200 mL/hr over 60 Minutes Intravenous Every 24 hours 02/01/18 1039 02/04/18 0852   02/01/18 1000  anidulafungin (ERAXIS) 200 mg in sodium chloride 0.9 % 200 mL IVPB     200 mg 78 mL/hr over 200 Minutes Intravenous  Once 02/01/18 0916 02/01/18 1602   02/01/18 0830  vancomycin (VANCOCIN) 1,250 mg in sodium chloride 0.9 % 250 mL IVPB     1,250 mg 166.7 mL/hr over 90 Minutes Intravenous STAT 02/01/18 0822 02/01/18 1035   01/31/18 1800  meropenem (MERREM) 1 g in sodium chloride 0.9 % 100 mL IVPB  Status:  Discontinued     1 g 200 mL/hr over 30 Minutes Intravenous Every 8 hours 01/31/18 1012 02/16/18 1619    01/31/18 0900  meropenem (MERREM) 1 g in sodium chloride 0.9 % 100 mL IVPB     1 g 200 mL/hr over 30 Minutes Intravenous STAT 01/31/18 0852 01/31/18 1005   01/27/18 0600  cefOXitin (MEFOXIN) 2 g in sodium chloride 0.9 % 100 mL IVPB     2 g 200 mL/hr over 30 Minutes Intravenous On call to O.R. 01/27/18 0533 01/27/18 0753       Subjective: Without complaints at this time  Objective: Vitals:   03/02/18 1400 03/02/18 1500 03/02/18 1540 03/02/18 1600  BP: (!) 180/66 (!) 122/44  (!) 168/69  Pulse: (!) 105 95 78 (!) 102  Resp: (!) 38 (!) 0 (!) 25 (!) 27  Temp:    99.3 F (37.4 C)  TempSrc:    Oral  SpO2: 95% 96% 99% 97%  Weight:      Height:        Intake/Output Summary (Last 24 hours) at 03/02/2018 1814 Last data filed at 03/02/2018 1518 Gross per 24 hour  Intake 1978.53 ml  Output 1855 ml  Net 123.53 ml   Filed Weights   02/28/18 0500 03/01/18 0500 03/02/18 0400  Weight: 54.6 kg 55.1 kg 50 kg    Examination:  General exam: Appears calm and comfortable, awake Respiratory system: Clear to auscultation. Respiratory effort normal. Cardiovascular system: S1 & S2 heard, RRR. Gastrointestinal system: Pos bs, soft Central nervous system: Alert and oriented. No focal neurological deficits. Extremities: Symmetric 5 x 5 power. Skin: No rashes, lesions Psychiatry: Judgement and insight appear normal. Mood & affect appears appropriate.   Data Reviewed: I have personally reviewed following labs and imaging studies  CBC: Recent Labs  Lab 02/26/18 0620 02/27/18 0455 02/28/18 0459 03/01/18 0240 03/02/18 0459  WBC 9.9 9.2 9.3 9.9 9.0  NEUTROABS 7.3 6.6 6.7 7.3 6.6  HGB 9.1* 9.0* 8.8* 9.6* 9.1*  HCT 28.8* 28.2* 27.2* 29.1* 27.7*  MCV 80.7 80.3 78.6 76.6* 76.5*  PLT 329 291 282 300 570   Basic Metabolic Panel: Recent Labs  Lab 02/26/18 0620 02/27/18 0455 02/28/18 0459 03/01/18 0240 03/02/18 0459  NA 144 140 136 137 138  K 2.8* 3.0* 3.1* 3.4* 4.2  CL 103 102 99 100  104  CO2 '30 30 29 29 27  '$ GLUCOSE 147* 140* 121* 141* 185*  BUN '20 22 16 14 18  '$ CREATININE 0.44 0.47 0.39* 0.38* 0.50  CALCIUM 8.6* 8.4* 8.1* 8.3* 8.6*  MG 1.8 1.8 1.7 1.9 1.8  PHOS 3.8 3.8 3.3 3.1 3.7   GFR: Estimated Creatinine Clearance: 50.2 mL/min (by C-G formula based on SCr of 0.5 mg/dL). Liver Function Tests: Recent Labs  Lab 02/25/18 0455 02/28/18 0459 03/01/18 0240 03/02/18 0459  AST '19 21 22 22  '$ ALT 18 14 14  13  ALKPHOS 79 94 114 119  BILITOT 0.8 0.2* 0.4 0.3  PROT 7.5 6.4* 6.9 6.7  ALBUMIN 2.0* 1.7* 1.8* 1.9*   No results for input(s): LIPASE, AMYLASE in the last 168 hours. No results for input(s): AMMONIA in the last 168 hours. Coagulation Profile: No results for input(s): INR, PROTIME in the last 168 hours. Cardiac Enzymes: No results for input(s): CKTOTAL, CKMB, CKMBINDEX, TROPONINI in the last 168 hours. BNP (last 3 results) No results for input(s): PROBNP in the last 8760 hours. HbA1C: No results for input(s): HGBA1C in the last 72 hours. CBG: Recent Labs  Lab 03/01/18 2339 03/02/18 0322 03/02/18 0805 03/02/18 1227 03/02/18 1722  GLUCAP 148* 125* 137* 156* 145*   Lipid Profile: Recent Labs    02/28/18 0459  TRIG 108   Thyroid Function Tests: No results for input(s): TSH, T4TOTAL, FREET4, T3FREE, THYROIDAB in the last 72 hours. Anemia Panel: No results for input(s): VITAMINB12, FOLATE, FERRITIN, TIBC, IRON, RETICCTPCT in the last 72 hours. Sepsis Labs: No results for input(s): PROCALCITON, LATICACIDVEN in the last 168 hours.  No results found for this or any previous visit (from the past 240 hour(s)).   Radiology Studies: Dg Abd 1 View  Result Date: 03/02/2018 CLINICAL DATA:  Ileus EXAM: ABDOMEN - 1 VIEW COMPARISON:  02/28/2018 FINDINGS: Stable NG tube. Surgical drains projecting over the left hemipelvis are stable. Bowel distension has again improved. Minimally distended small bowel loops continue to project over the abdomen. There is gas  in the colon. IMPRESSION: Improving ileus. Electronically Signed   By: Marybelle Killings M.D.   On: 03/02/2018 10:42    Scheduled Meds: . chlorhexidine  15 mL Mouth Rinse BID  . cloNIDine  0.2 mg Transdermal Weekly  . enalaprilat  1.25 mg Intravenous Q6H  . enoxaparin (LOVENOX) injection  40 mg Subcutaneous Q24H  . fentaNYL  25 mcg Transdermal Q72H  . furosemide  20 mg Intravenous Daily  . insulin aspart  0-20 Units Subcutaneous Q4H  . ipratropium-albuterol  3 mL Nebulization TID  . LORazepam  0.5 mg Intravenous Once  . mouth rinse  15 mL Mouth Rinse q12n4p  . metoprolol tartrate  10 mg Intravenous Q6H  . risperiDONE  0.5 mg Oral BID  . sodium chloride flush  10-40 mL Intracatheter Q12H   Continuous Infusions: . sodium chloride 10 mL/hr at 03/02/18 1518  . sodium chloride 10 mL/hr at 03/02/18 1518  . TPN ADULT (ION) 75 mL/hr at 03/02/18 1755     LOS: 105 days   Marylu Lund, MD [facility] Pager On Amion  If 7PM-7AM, please contact night-coverage 03/02/2018, 6:14 PM

## 2018-03-02 NOTE — Progress Notes (Signed)
Gynecologic Oncology Progress Note  72 y.o. female diagnosed with Stage IV ovarian cancer who received four cycles of neoadjuvant chemotherapy with carboplatin and taxol with fourth cycle completed on 12/20/17 .  She underwent interval debulking surgery with Dr. Skeet Latch on 01/27/18 consisting of an exploratory laparotomy infra-gastric omentectomy bilateral salpingo-oophorectomy radical optimal tumor debulking, resection of anterior abdominal wall.  Her post-operative course was complicated by findings to suggest bowel perforation on CT from 01/31/18 post-op with no site visualized on re-operation. It was felt that aspiration most likely occurred during intubation with the re-operation.          Subjective: Patient alert, looking down.  Not interacting as much as yesterday.  Nodding to questions.  Husband at the bedside. RNs at the bedside changing wound VAC dressing.  Denies pain.    Objective: Vital signs in last 24 hours: Temp:  [98.4 F (36.9 C)-100.4 F (38 C)] 98.9 F (37.2 C) (09/25 0800) Pulse Rate:  [87-126] 112 (09/25 0900) Resp:  [17-38] 28 (09/25 0900) BP: (145-206)/(55-110) 181/83 (09/25 1003) SpO2:  [92 %-100 %] 96 % (09/25 0900) Weight:  [110 lb 3.7 oz (50 kg)] 110 lb 3.7 oz (50 kg) (09/25 0400) Last BM Date: 02/28/18  Intake/Output from previous day: 09/24 0701 - 09/25 0700 In: 2413 [I.V.:2353; IV Piggyback:60] Out: 2955 [Urine:2100; Emesis/NG output:850; Drains:5]  Physical Examination performed by Dr. Precious Haws:  General: alert, flat affect,  Appears comfortable  nodding head when asked questions  Cardio: Tachycardic at 110 bpm  Resp: Clear breath sounds.  Trach in place.  Abdomen:   Abdomen more soft, normoactive bowel sounds in all quadrants.  Remains tympanic on percussion. Nontender, no rebound or guarding. Mildy distended.   NG suction on LIS with 400cc noted in cannister of dark brown-reddish output   Wound VAC being changed currently. See media for  picture of incision.    LLQ JP with minimal serous output   Extremities: SCD in place, no edema  Flexiseal in place with 125 cc output marked from yesterday.  Urine wick in place on suction with clear urine in canister  Labs: WBC/Hgb/Hct/Plts:  9.0/9.1/27.7/314 (09/25 0459) BUN/Cr/glu/ALT/AST/amyl/lip:  18/0.50/--/13/22/--/-- (09/25 0459)    Assessment: 72 y.o. s/p Procedure(s): Interval debulking surgery with Dr. Skeet Latch on 01/27/18 consisting of an exploratory laparotomy infra-gastric omentectomy bilateral salpingo-oophorectomy radical optimal tumor debulking, resection of anterior abdominal wall.  Reoperation of EXPLORATORY LAPAROTOMY, ABDOMINAL WASHOUT on 01/31/18:   Neuro: Alert, fentanyl patch in place  Pain: Fentanyl patch in place.  Plan to continue IV Tylenol.    Heme: Post-operative anemia: Hgb 9.1 and Hct 27.7 this am. Last transfusion 02/19/18.  Anemia related to acute critical illness, no evidence of active bleeding.   ID: Fevers resolved.  IV antibiotics completed 02/28/18 after one month of therapy.  Presumed sepsis initially from gastrointestinal source but unable to identify the perforation after exp lap on 01/31/18. Contribution from pneumonia and possibly undrained left pelvic fluid collection seen on CT scan 02/07/18; however the pelvic collection is improving.   Followup CT's have shown decrease in pelvic fluid collection.  Granix received 02/01/18 per Dr. Alvy Bimler.  Vancomycin stopped on 02/04/18 and then fevers began again. Restarted Vancomycin on 02/06/18. Anidulafungin initiated 02/01/18.  Meropenum IV started 01/31/18 and discontinued on 02/16/18.  Unasyn started 02/16/18.  Blood cultures from 01/31/18 & 02/06/18 &  02/17/18 with no growth. Tracheal aspirate from 02/01/18 resulting few gram positive rods. Repeated 02/06/18 showed few yeast.  CV: Tachycardia and hypertension managed  per Hospitalist.   Resp: ARDS in setting of aspiration pneumonia and sepsis.  Trach in place. Per  ICU/Pulm/Critical care.   GI: Abd 1 view 03/02/18 am: Results pending but appears improved on image review compared to previous xray.  -CT AP on 01/31/18 with findings consistent with GI perforation.  No obvious perforation identified on re-operation on 01/31/18. -C diff negative.  - Flexiseal in place; nursing states with gas at times -TPN initially started 02/02/18 and discontinued 02/10/18 after tube feedings began. TPN restarted due to ileus 02/24/18 - Persistent abdominal distension improved- CT AP 9/18- ileus.  -02/13/18 -Enteric versus old hematoma contents in LLQ JP.  -JP fluid culture from 02/13/18:  FEW WBC PRESENT, PREDOMINANTLY PMN  MODERATE GRAM NEGATIVE RODS FEW GRAM POSITIVE RODS - CT pelvis 02/14/18: Stable to slight decrease in pelvic fluid collection, no evidence of contrast communication.   GU: AKI post-op resolved. Creatinine 0.50 this am.  Purewick in place to suction with normal appearing urine in canister.   FEN: Replacement of K+ per Hospitalist [place]- K+ 4.2 this am  Prophylaxis: SCDs in place. Continue Lovenox prophylaxis. No active bleeding.  ADL:  - PTx working with patient. Pt to the chair with assist. - SLP working with patient on speech. Consider possible swallow eval today after trach exchange  Plan: 1. Wound VAC to continue.  2. Continue TPN. 3. For trach downsizing today per CC  4. NG to be clamped per Dr. Gerarda Fraction.  Check residual every six hours. If tolerates, consider possible removal tomorrow then swallow eval after. 5. IV Tylenol ordered 5.   Continue to monitor for delirium with the Fentanyl dosing. 6.   Continue plan per Hospitalist/CC/ID/Dr. Luna Fuse D Cross 03/02/2018, 10:17 AM

## 2018-03-02 NOTE — Progress Notes (Signed)
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  She is completed 3 cycles of chemotherapy and is now status post bilateral salpingo-oophorectomy and omentectomy with debulking on 8/22.   8/26 transferred to ICU w/ concern for perforation w/ free air noted.  Imipenem started. Went for exploratory laparotomy with washout.  No significant contamination identified, presumed spontaneously closed microperf. Returned with abdominal drains postoperative chest x-ray showing evidence of aspiration pneumonia; mixed respiratory and metabolic acidosis & in septic shock. Required intubation.   8/27 GI medicine consulted, did not feel she had overt GIB.  Rec's prophylaxis with PPI.  Signed off.  Weaning pressors. Hgb dropped from 8.8 to 7.4; got 2 units of blood,  febrile, added. vancomycin.  Neupogen given for neutropenia.   8/28-9/1 Shock resolved, TPN started. Fevers. Delirium barrier to weaning.  ABX for fever  9/02-9/6 Delirium. Sedation difficult, diuresing, remains on abx. spiking temps, loculated fluid collection identified in left pelvis. Not amedable to drainage.  ID consulted. Started seroquel. 9/4. Upper 2 drains removed. Staples removed.from incision site.  9/4-9/9 ID consulted by family.  Fevers ongoing, CT abd with improved fluid collections.  Failing PSV wean.  No pressors.  No sedation.   Husband deferring decisions about care to son (newly married).   9/10  Sister at bedside - says she is not in favor of trach but says is the decision of son/dpoa. Son met with Dr Alvy Bimler for goals of care - current status precludes chemo but goal is trach and full code and full medical care and later chemo when ECOG improves.  Low grade fever persists - ID following. More  awake today. No Perf leak on CT abd and restarted TF 9/12-9/14  s/p Trach - Dr Nelda Marseille. Levophed post trach.  Fevers.  1 unit PRBC 9/15 ATC for first time; tolerating well. Did PRVC overnight 9/19: Ileus by CT abdomen.  Is been requiring full mechanical ventilatory support due to abdominal distention 9/19 through 9/23: Off the ventilator now for over 48 hours.  Starting more frequent Passy-Muir valve trialing on 9/23.  Tube feeds still on hold due to ileus and malabsorption. 9/25: Tolerating Passy-Muir valve trials, surgical services to start NG clamping trials  Subjective / Interval Events  Denies shortness of breath or chest pain.  Did not sleep well last night  Objective    Blood pressure (Abnormal) 180/73, pulse (Abnormal) 112, temperature 99.6 F (37.6 C), temperature source Oral, resp. rate (Abnormal) 21, height '5\' 3"'$  (1.6 m), weight 50 kg, SpO2 95 %.        Intake/Output Summary (Last 24 hours) at 03/02/2018 0921 Last data filed at 03/02/2018 7619 Gross per 24 hour  Intake 2413.03 ml  Output 2955 ml  Net -541.97 ml   Filed Weights   02/28/18 0500 03/01/18 0500 03/02/18 0400  Weight: 54.6 kg 55.1 kg 50 kg     EXAM  General: 72 year old African-American female currently resting in bed.  No acute distress. HEENT: She does exhibit some temporal wasting.  She has a #6 cuffed tracheostomy in place, this is deflated.  Tolerates PMV. Pulmonary: Diminished bases no accessory use Cardiac: Regular rate and rhythm Abdomen: Nontender wound VAC intact Extremities: No edema brisk cap refill warm strong pulses Neuro: Awake, follows commands, intermittently confused,  continues to improve GU: Clear yellow  Labs    PULMONARY No labs  CBC Recent Labs  Lab 02/28/18 0459 03/01/18 0240 03/02/18 0459  HGB 8.8* 9.6* 9.1*  HCT 27.2* 29.1* 27.7*  WBC 9.3 9.9 9.0  PLT 282 300 314    COAGULATION No results for input(s): INR in the last 168 hours. CARDIAC   No results for input(s):  TROPONINI in the last 168 hours. No results for input(s): PROBNP in the last 168 hours.  CHEMISTRY Recent Labs  Lab 02/26/18 0620 02/27/18 0455 02/28/18 0459 03/01/18 0240 03/02/18 0459  NA 144 140 136 137 138  K 2.8* 3.0* 3.1* 3.4* 4.2  CL 103 102 99 100 104  CO2 '30 30 29 29 27  '$ GLUCOSE 147* 140* 121* 141* 185*  BUN '20 22 16 14 18  '$ CREATININE 0.44 0.47 0.39* 0.38* 0.50  CALCIUM 8.6* 8.4* 8.1* 8.3* 8.6*  MG 1.8 1.8 1.7 1.9 1.8  PHOS 3.8 3.8 3.3 3.1 3.7   Estimated Creatinine Clearance: 50.2 mL/min (by C-G formula based on SCr of 0.5 mg/dL).  LIVER Recent Labs  Lab 02/25/18 0455 02/28/18 0459 03/01/18 0240 03/02/18 0459  AST '19 21 22 22  '$ ALT '18 14 14 13  '$ ALKPHOS 79 94 114 119  BILITOT 0.8 0.2* 0.4 0.3  PROT 7.5 6.4* 6.9 6.7  ALBUMIN 2.0* 1.7* 1.8* 1.9*   INFECTIOUS No results for input(s): LATICACIDVEN, PROCALCITON in the last 168 hours.  ENDOCRINE CBG (last 3)  Recent Labs    03/01/18 2339 03/02/18 0322 03/02/18 0805  GLUCAP 148* 125* 137*   IMAGING Abdomen CT reports reviewed  Micro Data: Blood Culture x2 8/26 >> neg Sputum 8/27: Abundant yeast, few Candida albicans Blood Cx 9/1 >> negative  Respiratory 9/1 >> few Candida Urine 9/1 >> negative Peritoneal fluid 8/31 >> neg Abd Drainage 9/5 >> few lactobacillus  JP Drainage 9/8 >> E-Coli C-Diff 9/12 >> negative  Antimicrobials:  Meropenem 8/26 >> 9/11 Vancomycin 8/27 >> 8/30; 9/1 >> 9/11, 9/17>> Eraxis 8/27 >>  Unasyn 9/11 >>   Studies: CT abdomen 8/26: Result noted   echocardiogram 8/27: Particular ejection fraction 65 to 70%.  She has moderate LVH, and hyperdynamic LV function.  There is grade 1 diastolic dysfunction.  Trivial MR.  Normal left atrial size.  The IV C was not visualized  CT abdomen 9/2 >> drainage catheters within the pelvis, RUQ, LUQ.  Significant interval decrease in size of previously described intra-abdominal fluid, no residual free intraperitoneal air. There is a  persistent loculated fluid & gas collection along the left pelvic sidewall measuring up to 10 cm.  Moderate bilateral pleural effusions, underlying opacities favored to represent atelectasis.  Marked wall thickening of the sigmoid colon.  Peripheral low attenuation within the spleen concerning for splenic infarcts.  Wall thickening of the urinary bladder.   CT abdomen 9/6: Improving left lateral pelvic fluid collection, resolved peritoneal fluid in the left lower abdomen.  Concentric thickening of the sigmoid colon.  Decreased bilateral effusion and atelectasis.  CT ABD 9/9 Similar to slight decrease in size of ill-defined complex fluid and gas in the lateral left hemipelvis. 2. No evidence of contrast communication between the thickened sigmoid and this pelvic sidewall process. 3. Foley catheter and air within theurinary bladder, which is not decompressed. Correlate with catheter function.   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  This does appear to slowly been improving.  She is awake, and now interactive. -Phonation is strong with Passy-Muir valve, but does not tolerate finger occlusion -Has tolerated aggressive PMV trials over the last 48 hours Plan Will change to cuffless size #4 tracheostomy Continue to encourage Passy-Muir valve trials Hopefully swallowing evaluation soon   Status post exploratory laparotomy for free air on 8/26 ; following bilateral salpingo-oophorectomy, omentectomy, and debulking surgery on 8/22. Developed fluid collections not amenable to IR drainage.  Reducing in size on CTA/P Plan  antibiotics per infectious  disease Wound care per surgical services   Acute metabolic encephalopathy -Mental status is improving Plan Continue supportive care  Fluid and electrolyte balance: Hypokalemia Plan Replace and recheck as indicated  Severe Protein Calorie Malnutrition, prealbumin 3.5 Plan  Continuing TNA, hopefully can use gut again soon  Anemia of critical illness Plan Trend CBC  Hyperglycemia Plan  Continue sliding scale insulin  Best practice/Goals of care  DVT prophylaxis: LMWH GI prophylaxis: PPI Diet: Currently n.p.o. with NG tube in place to suction Mobility: BR Code Status: Full Code Family Communication: Patient has been updated at bedside 02/24/2018.   Disposition / Summary of Today's Plan 03/02/2018  Clinically continues to improve.  Will plan on downsizing her tracheostomy today to 4 cuffless.  Also speaking with surgical services we will clamp NG tube and try clamping trial.  I am going to ask speech therapy to evaluate swallowing following trach downsizing  Erick Colace ACNP-BC [place] Pager # [phone] OR # [phone] if no answer

## 2018-03-03 ENCOUNTER — Inpatient Hospital Stay (HOSPITAL_COMMUNITY): Payer: Medicare PPO

## 2018-03-03 ENCOUNTER — Encounter: Payer: Self-pay | Admitting: Oncology

## 2018-03-03 DIAGNOSIS — Z430 Encounter for attention to tracheostomy: Secondary | ICD-10-CM

## 2018-03-03 LAB — GLUCOSE, CAPILLARY
GLUCOSE-CAPILLARY: 143 mg/dL — AB (ref 70–99)
GLUCOSE-CAPILLARY: 131 mg/dL — AB (ref 70–99)
GLUCOSE-CAPILLARY: 133 mg/dL — AB (ref 70–99)
GLUCOSE-CAPILLARY: 108 mg/dL — AB (ref 70–99)
GLUCOSE-CAPILLARY: 111 mg/dL — AB (ref 70–99)
GLUCOSE-CAPILLARY: 136 mg/dL — AB (ref 70–99)
Glucose-Capillary: 111 mg/dL — ABNORMAL HIGH (ref 70–99)

## 2018-03-03 LAB — CBC WITH DIFFERENTIAL/PLATELET
Basophils Absolute: 0 10*3/uL (ref 0.0–0.1)
Basophils Relative: 0 %
Eosinophils Absolute: 0.2 10*3/uL (ref 0.0–0.7)
Eosinophils Relative: 2 %
HEMATOCRIT: 25.9 % — AB (ref 36.0–46.0)
HEMOGLOBIN: 8.3 g/dL — AB (ref 12.0–15.0)
Lymphocytes Relative: 28 %
Lymphs Abs: 2.4 10*3/uL (ref 0.7–4.0)
MCH: 24.9 pg — ABNORMAL LOW (ref 26.0–34.0)
MCHC: 32 g/dL (ref 30.0–36.0)
MCV: 77.5 fL — ABNORMAL LOW (ref 78.0–100.0)
MONO ABS: 0.9 10*3/uL (ref 0.1–1.0)
MONOS PCT: 10 %
NEUTROS ABS: 5 10*3/uL (ref 1.7–7.7)
Neutrophils Relative %: 60 %
Platelets: 261 10*3/uL (ref 150–400)
RBC: 3.34 MIL/uL — ABNORMAL LOW (ref 3.87–5.11)
RDW: 21.2 % — ABNORMAL HIGH (ref 11.5–15.5)
WBC: 8.5 10*3/uL (ref 4.0–10.5)

## 2018-03-03 LAB — COMPREHENSIVE METABOLIC PANEL
ALBUMIN: 1.9 g/dL — AB (ref 3.5–5.0)
ALK PHOS: 130 U/L — AB (ref 38–126)
ALT: 17 U/L (ref 0–44)
ANION GAP: 5 (ref 5–15)
AST: 25 U/L (ref 15–41)
BUN: 24 mg/dL — ABNORMAL HIGH (ref 8–23)
CALCIUM: 8.4 mg/dL — AB (ref 8.9–10.3)
CO2: 26 mmol/L (ref 22–32)
CREATININE: 0.52 mg/dL (ref 0.44–1.00)
Chloride: 107 mmol/L (ref 98–111)
GFR calc non Af Amer: >60 mL/min (ref >60)
GLUCOSE: 137 mg/dL — AB (ref 70–99)
Potassium: 4.5 mmol/L (ref 3.5–5.1)
Sodium: 138 mmol/L (ref 135–145)
TOTAL PROTEIN: 6.9 g/dL (ref 6.5–8.1)
Total Bilirubin: 0.5 mg/dL (ref 0.3–1.2)

## 2018-03-03 LAB — PHOSPHORUS: Phosphorus: 4.5 mg/dL (ref 2.5–4.6)

## 2018-03-03 LAB — MAGNESIUM: Magnesium: 2 mg/dL (ref 1.7–2.4)

## 2018-03-03 MED ORDER — RISPERIDONE 0.5 MG PO TBDP
0.5000 mg | ORAL_TABLET | Freq: Every day | ORAL | Status: DC
  Filled 2018-03-03: qty 1

## 2018-03-03 MED ORDER — TRAVASOL 10 % IV SOLN
INTRAVENOUS | Status: DC
  Administered 2018-03-03: 18:00:00 via INTRAVENOUS
  Filled 2018-03-03: qty 900

## 2018-03-03 MED ORDER — ACETAMINOPHEN 325 MG PO TABS
650.0000 mg | ORAL_TABLET | Freq: Four times a day (QID) | ORAL | Status: DC | PRN
  Administered 2018-03-03: 650 mg via ORAL
  Filled 2018-03-03: qty 2

## 2018-03-03 MED ORDER — KETOROLAC TROMETHAMINE 15 MG/ML IJ SOLN
15.0000 mg | Freq: Four times a day (QID) | INTRAMUSCULAR | Status: DC | PRN
  Administered 2018-03-03 (×2): 15 mg via INTRAVENOUS
  Filled 2018-03-03 (×3): qty 1

## 2018-03-03 NOTE — Progress Notes (Signed)
  Speech Language Pathology Treatment: Jo Parker Speaking valve  Patient Details Name: Jo Parker MRN: [id number] DOB: 01-06-1946 Today's Date: 03/03/2018 Time: 1324-4010 SLP Time Calculation (min) (ACUTE ONLY): 48 min  Assessment / Plan / Recommendation Clinical Impression  Pt seen with new shiley 4 cuffless trach, Excellent redirection of air to supper airway with sustained PMSV use, sensed via nasal exhalation, strong cough response with oral expectoration of secretions, no signs of air trapping with 20 minute placement x2 with intermittent checks. Despite max cues from SLP, husband, RN, NP pt did not initiate verbalization. Her eyes were only briefly focused to the speaker and without stimulation she quickly drifted off to sleep. Attempted to elicit automatic response with holding of the phone and hearing her sons voice, but this did not trigger any increased attention or awareness. Likely pts mental status is worsened from baseline this am due to pm meds given. RN encouraged to place PMSV periodically through the day with family present to encourage interaction and spontaneous communication.   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  She is completed 3 cycles of chemotherapy and is now status post bilateral salpingo-oophorectomy and omentectomy with debulking on 8/22.   Post op complication for possible bowel obstruction and presumed aspiration during intubation.   Acute on chronic hypoxemic respiratory failure requiring prolonged intubation (Pt was intubated 8/26 and tracheostomy was placed 9/14) and now tracheostomy, tolerating TCT and no longer requiring ventilator use.  PMSV trials started last week - with improved tolerance Friday 02/25/2018.   Pt on 28% trach collar.  2 sisters present today including Jo Parker and Jo Parker.  Pt wearing valve upon SLP entrance to room.        SLP Plan  Continue with current plan of care        Recommendations         Patient may use Passy-Muir Speech Valve: Intermittently with supervision;During all therapies with supervision PMSV Supervision: Full         Follow up Recommendations: LTACH SLP Visit Diagnosis: Aphonia (R49.1) Plan: Continue with current plan of care       GO               Jo [place], MA [place] Pager [phone] Office [phone]  Jo Parker 03/03/2018, 10:41 AM

## 2018-03-03 NOTE — Progress Notes (Signed)
PT Cancellation Note  Patient Details Name: Jo Parker MRN: [id number] DOB: 1945-09-13   Cancelled Treatment:    Head CT today will attempt to see another day as schedule permits.  Pt has been evaluated with rec for LTAC.   Rica Koyanagi  PTA Acute  Rehabilitation Services Pager      [phone] Office      [phone]

## 2018-03-03 NOTE — Progress Notes (Signed)
Patient ID: Jo Parker, female   DOB: 10/17/1945, 72 y.o.   MRN: [id number]         [facility] for Infectious Disease  Date of Admission:  01/27/2018            ASSESSMENT: She is starting to have some low-grade fever up to 100.8 degrees.  She is at risk for all ICU related infections.  Her chest x-ray shows no change in her persistent infiltrates right greater than left.  Repeat blood and urine cultures have been ordered.  PLAN: 1. Observe off of antibiotics  Principal Problem:   Pelvic abscess in female Active Problems:   Severe sepsis ([place])   Peritonitis ([place])   HCAP (healthcare-associated pneumonia)   Postoperative wound infection   Primary peritoneal carcinomatosis ([place])   Ovarian cancer ([place])   Status post bilateral salpingo-oophorectomy   Severe protein-calorie malnutrition ([name])   COPD (chronic obstructive pulmonary disease) ([name])   HLD (hyperlipidemia)   Absolute anemia   White coat syndrome with hypertension   AKI (acute kidney injury) ([place])   Acute metabolic encephalopathy   Acute respiratory distress   Low urine output   Somnolence   At risk for fluid volume overload   Free intraperitoneal air   Acute on chronic respiratory failure ([name])   Pressure injury of skin   Aspiration pneumonia ([name])   Wound dehiscence, surgical, initial encounter   Scheduled Meds: . chlorhexidine  15 mL Mouth Rinse BID  . cloNIDine  0.2 mg Transdermal Weekly  . enalaprilat  1.25 mg Intravenous Q6H  . enoxaparin (LOVENOX) injection  40 mg Subcutaneous Q24H  . fentaNYL  25 mcg Transdermal Q72H  . furosemide  20 mg Intravenous Daily  . insulin aspart  0-20 Units Subcutaneous Q4H  . ipratropium-albuterol  3 mL Nebulization TID  . mouth rinse  15 mL Mouth Rinse q12n4p  . metoprolol tartrate  10 mg Intravenous Q6H  . risperiDONE  0.5 mg Oral QHS  . sodium chloride flush  10-40 mL Intracatheter Q12H   Continuous Infusions: . sodium chloride 10 mL/hr at 03/02/18 1839    . sodium chloride 10 mL/hr at 03/02/18 1839  . TPN ADULT (ION) 75 mL/hr at 03/02/18 1839   PRN Meds:.sodium chloride, sodium chloride, acetaminophen, hydrALAZINE, iopamidol, labetalol, ondansetron (ZOFRAN) IV, sodium chloride flush  Review of Systems: Review of Systems  Unable to perform ROS: Intubated    Allergies  Allergen Reactions  . Oxycodone     confusion    OBJECTIVE: Vitals:   03/03/18 0603 03/03/18 0615 03/03/18 0630 03/03/18 0804  BP:      Pulse: (!) 104 (!) 101 (!) 105   Resp: (!) 22 (!) 22 (!) 23   Temp:    99.1 F (37.3 C)  TempSrc:    Oral  SpO2: 95% 96% 96%   Weight:      Height:       Body mass index is 20.62 kg/m.  Physical Exam  Constitutional:  She is sleeping soundly.  Her husband and son, Jo Parker, are at the bedside.  Cardiovascular: Normal rate, regular rhythm and normal heart sounds.  No murmur heard. Pulmonary/Chest: Effort normal. She has no wheezes. She has no rales.    Abdominal: Soft. She exhibits no distension. There is no tenderness.  She has a VAC wound dressing in place.  Her JP drain bulb is empty.  Yesterday's drain output was not recorded.  Skin: No rash noted.    Lab Results Lab Results  Component  Value Date   WBC 8.5 03/03/2018   HGB 8.3 (L) 03/03/2018   HCT 25.9 (L) 03/03/2018   MCV 77.5 (L) 03/03/2018   PLT 261 03/03/2018    Lab Results  Component Value Date   CREATININE 0.52 03/03/2018   BUN 24 (H) 03/03/2018   NA 138 03/03/2018   K 4.5 03/03/2018   CL 107 03/03/2018   CO2 26 03/03/2018    Lab Results  Component Value Date   ALT 17 03/03/2018   AST 25 03/03/2018   ALKPHOS 130 (H) 03/03/2018   BILITOT 0.5 03/03/2018     Microbiology: No results found for this or any previous visit (from the past 240 hour(s)).  Michel Bickers, MD [facility]ious [place] [facility] [phone] pager   [phone] cell 03/03/2018, 10:08 AM

## 2018-03-03 NOTE — Progress Notes (Signed)
Nutrition Follow-up  DOCUMENTATION CODES:   Not applicable  INTERVENTION:  - Continue TPN regimen per Pharmacy. - Diet advancement as medically feasible.   NUTRITION DIAGNOSIS:   Increased nutrient needs related to post-op healing as evidenced by estimated needs -ongoing  GOAL:   Patient will meet greater than or equal to 90% of their needs -met with TPN regimen  MONITOR:   Weight trends, Labs, I & O's, Other (Comment)(TPN regimen)  ASSESSMENT:   Patient with PMH significant for COPD< HLD, HTN, mitral valve prolapse, and Stage IV ovarian cancer s/p neoadjuvant chemotherapy . Presents this admission for robotic assisted bilateral salpingo-oophorectomy omentectomy interval debulking.   Significant Events: 8/22:ex lap with infra-gastric omentectomy bilateral salpingo-oophorectomy radical optimal tumor debulking, resection of anterior abdominal wall 8/24:remained somnolent and was confused, noted to have post-op UTI 8/26:remained confused and having chest pain; transferred to SDU for Cardene drip; x-ray showed free air and CT abd concerning for ischemic bowel, bowel pneumatosis, and large amount of pneumoperitoneum; repeat ex lap with washout. 8/27:triple lumen PICC placed in L brachial 8/28:TPN initiated; ILE hold x7 days; received Granix per Oncology. 9/4:trickle TF started via NGT. 9/5:TPN weaned off. 9/6:TF rate advanced to 50 mL/hr(goal rate). 9/9:TF placed on hold d/t concern for microperf 9/10:TF restarted at 50 mL/hr. 9/12:tracheostomy placed. 9/16: vomited and NGT dislodged and subsequently re-advanced; TF placed on hold; trach collar during the day and vent support at night.  9/18:CT abdomen indicates ileus. 9/19:TPN initiated, ILE on hold x7 days per ICU protocol; I&D d/t abdominal wound dehiscence. 9/20: NGT replaced 9/22: entirely off of vent support 9/23: trial with PMV 9/25: patient pulled out NGT; changed to cuffless trach and size  downsized    Current weight consistent with admission weight. Patient previously with NGT to LIS but she pulled NGT yesterday around 3:00 PM. NGT not replaced. She has triple lumen PICC and is receiving compounded TPN @ 75 mL/hr (10% amino acids, 15% dextrose) which is providing 1818 kcal, 90 grams of protein.   Per Pete's note this AM: patient not yet a candidate for decannulation, acute metabolic encephalopathy which has been fluctuating and plan to stop sedating medications, hopeful to advance diet in the near future, anemia of critical illness, hyperglycemia on sliding scale insulin.    Medications reviewed; 20 mg IV Lasix/day, sliding scale Novolog.  Labs reviewed; CBGs: 143, 131, and 133 mg/dL today, BUN: 24 mg/dL, Ca: 8.4 mg/dL, Alk Phos elevated.      Diet Order:   Diet Order            Diet NPO time specified  Diet effective now              EDUCATION NEEDS:   Education needs have been addressed  Skin:  Skin Assessment: Skin Integrity Issues: Skin Integrity Issues:: Unstageable Unstageable: full thickness to R ear Incisions: abdominal (8/26)  Last BM:  9/25  Height:   Ht Readings from Last 1 Encounters:  01/31/18 '5\' 3"'$  (1.6 m)    Weight:   Wt Readings from Last 1 Encounters:  03/03/18 52.8 kg    Ideal Body Weight:  52.3 kg  BMI:  Body mass index is 20.62 kg/m.  Estimated Nutritional Needs:   Kcal:  1750-1925 (30-33 kcal/kg)  Protein:  82-93 grams (1.4-1.6 grams/kg)  Fluid:  >/= 1.5 L/day     Jarome Matin, MS, RD, LDN, [facility] Inpatient Clinical Dietitian Pager # [phone] After hours/weekend pager # [phone]

## 2018-03-03 NOTE — Progress Notes (Signed)
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  She is completed 3 cycles of chemotherapy and is now status post bilateral salpingo-oophorectomy and omentectomy with debulking on 8/22.   8/26 transferred to ICU w/ concern for perforation w/ free air noted.  Imipenem started. Went for exploratory laparotomy with washout.  No significant contamination identified, presumed spontaneously closed microperf. Returned with abdominal drains postoperative chest x-ray showing evidence of aspiration pneumonia; mixed respiratory and metabolic acidosis & in septic shock. Required intubation.   8/27 GI medicine consulted, did not feel she had overt GIB.  Rec's prophylaxis with PPI.  Signed off.  Weaning pressors. Hgb dropped from 8.8 to 7.4; got 2 units of blood,  febrile, added. vancomycin.  Neupogen given for neutropenia.   8/28-9/1 Shock resolved, TPN started. Fevers. Delirium barrier to weaning.  ABX for fever  9/02-9/6 Delirium. Sedation difficult, diuresing, remains on abx. spiking temps, loculated fluid collection identified in left pelvis. Not amedable to drainage.  ID consulted. Started seroquel. 9/4. Upper 2 drains removed. Staples removed.from incision site.  9/4-9/9 ID consulted by family.  Fevers ongoing, CT abd with improved fluid collections.  Failing PSV wean.  No pressors.  No sedation.   Husband deferring decisions about care to son (newly married).   9/10  Sister at bedside - says she is not in favor of trach but says is the decision of son/dpoa. Son met with Dr Alvy Bimler for goals of care - current status precludes chemo but goal is trach and full code and full medical care and later chemo when ECOG improves.  Low grade fever persists - ID following. More  awake today. No Perf leak on CT abd and restarted TF 9/12-9/14  s/p Trach - Dr Nelda Marseille. Levophed post trach.  Fevers.  1 unit PRBC 9/15 ATC for first time; tolerating well. Did PRVC overnight 9/19: Ileus by CT abdomen.  Is been requiring full mechanical ventilatory support due to abdominal distention 9/19 through 9/23: Off the ventilator now for over 48 hours.  Starting more frequent Passy-Muir valve trialing on 9/23.  Tube feeds still on hold due to ileus and malabsorption. 9/25: Tolerating Passy-Muir valve trials, surgical services to start NG clamping trials.  She later pulled NG tube out.  She was downsized to size 4 cuffless tracheostomy 9/26: More encephalopathic today.  Awake and interactive but nonverbal.  Did receive 3 doses of fentanyl overnight.  Still on Risperdal.  Nursing reports still not sleeping.  Subjective / Interval Events  Denies shortness of breath or chest pain  Objective    Blood pressure (Abnormal) 162/63, pulse (Abnormal) 105, temperature 99.1 F (37.3 C), temperature source Oral, resp. rate (Abnormal) 23, height '5\' 3"'$  (1.6 m), weight 52.8 kg, SpO2 96 %.        Intake/Output Summary (Last 24 hours) at 03/03/2018 0850 Last data filed at 03/03/2018 0539 Gross per 24 hour  Intake 2004.41 ml  Output 2575 ml  Net -570.59 ml   Filed Weights   03/01/18 0500 03/02/18 0400 03/03/18 0500  Weight: 55.1 kg 50 kg 52.8 kg     EXAM  General: 72 year old African-American female currently awake, interactive, but nonverbal HEENT: Normocephalic atraumatic the #4 cuffless tracheostomy is unremarkable Pulmonary: Clear, occasional rhonchi with cough Cardiac: Regular rate and  rhythm Abdomen: Soft, nontender positive bowel sounds Extremities: Warm and dry brisk capillary refill Neuro: Awake, interactive, moves all extremities.  Will not phonate today  Labs    PULMONARY No labs  CBC Recent Labs  Lab 03/01/18 0240 03/02/18 0459 03/03/18 0546  HGB 9.6* 9.1* 8.3*  HCT  29.1* 27.7* 25.9*  WBC 9.9 9.0 8.5  PLT 300 314 261    COAGULATION No results for input(s): INR in the last 168 hours. CARDIAC   No results for input(s): TROPONINI in the last 168 hours. No results for input(s): PROBNP in the last 168 hours.  CHEMISTRY Recent Labs  Lab 02/27/18 0455 02/28/18 0459 03/01/18 0240 03/02/18 0459 03/03/18 0546  NA 140 136 137 138 138  K 3.0* 3.1* 3.4* 4.2 4.5  CL 102 99 100 104 107  CO2 '30 29 29 27 26  '$ GLUCOSE 140* 121* 141* 185* 137*  BUN '22 16 14 18 '$ 24*  CREATININE 0.47 0.39* 0.38* 0.50 0.52  CALCIUM 8.4* 8.1* 8.3* 8.6* 8.4*  MG 1.8 1.7 1.9 1.8 2.0  PHOS 3.8 3.3 3.1 3.7 4.5   Estimated Creatinine Clearance: 52.6 mL/min (by C-G formula based on SCr of 0.52 mg/dL).  LIVER Recent Labs  Lab 02/25/18 0455 02/28/18 0459 03/01/18 0240 03/02/18 0459 03/03/18 0546  AST '19 21 22 22 25  '$ ALT '18 14 14 13 17  '$ ALKPHOS 79 94 114 119 130*  BILITOT 0.8 0.2* 0.4 0.3 0.5  PROT 7.5 6.4* 6.9 6.7 6.9  ALBUMIN 2.0* 1.7* 1.8* 1.9* 1.9*   INFECTIOUS No results for input(s): LATICACIDVEN, PROCALCITON in the last 168 hours.  ENDOCRINE CBG (last 3)  Recent Labs    03/03/18 0138 03/03/18 0336 03/03/18 0731  GLUCAP 143* 131* 133*   IMAGING Abdomen CT reports reviewed  Micro Data: Blood Culture x2 8/26 >> neg Sputum 8/27: Abundant yeast, few Candida albicans Blood Cx 9/1 >> negative  Respiratory 9/1 >> few Candida Urine 9/1 >> negative Peritoneal fluid 8/31 >> neg Abd Drainage 9/5 >> few lactobacillus  JP Drainage 9/8 >> E-Coli C-Diff 9/12 >> negative  Antimicrobials:  Meropenem 8/26 >> 9/11 Vancomycin 8/27 >> 8/30; 9/1 >> 9/11, 9/17>> Eraxis 8/27 >>  Unasyn 9/11 >>   Studies: CT abdomen 8/26: Result noted   echocardiogram 8/27: Particular ejection fraction 65 to 70%.  She has moderate LVH, and hyperdynamic LV function.  There is grade 1 diastolic dysfunction.  Trivial MR.  Normal left atrial size.  The IV C was not visualized  CT  abdomen 9/2 >> drainage catheters within the pelvis, RUQ, LUQ.  Significant interval decrease in size of previously described intra-abdominal fluid, no residual free intraperitoneal air. There is a persistent loculated fluid & gas collection along the left pelvic sidewall measuring up to 10 cm.  Moderate bilateral pleural effusions, underlying opacities favored to represent atelectasis.  Marked wall thickening of the sigmoid colon.  Peripheral low attenuation within the spleen concerning for splenic infarcts.  Wall thickening of the urinary bladder.   CT abdomen 9/6: Improving left lateral pelvic fluid collection, resolved peritoneal fluid in the left lower abdomen.  Concentric thickening of the sigmoid colon.  Decreased bilateral effusion and atelectasis.  CT ABD 9/9 Similar to slight decrease in size of ill-defined complex fluid and gas in the lateral left hemipelvis. 2. No evidence of contrast communication between the thickened sigmoid and this pelvic sidewall process. 3. Foley catheter and air within theurinary bladder, which is not decompressed. Correlate with catheter function.  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  This does appear to slowly been improving.  She is awake, and now interactive. Plan Continue routine tracheostomy care Encourage PMV as much as tolerated Not a candidate for decannulation currently, however if stamina/endurance and mental status continued to improve we could consider it   Status post exploratory laparotomy for free air on 8/26 ; following bilateral salpingo-oophorectomy, omentectomy, and debulking surgery on 8/22.  Developed fluid collections not amenable to IR drainage.  Reducing in size on CTA/P Plan  continue antibiotics and wound care per primary service  Acute metabolic encephalopathy -She is more encephalopathic today.  Has not been sleeping.  We need to start paring down on medications Plan Supportive care Stopping sedating meds  Changing Risperdal to QHS only  Stopping fent and ativan  Fluid and electrolyte balance: Plan Trend chemistries  Severe Protein Calorie Malnutrition, prealbumin 3.5 Plan  Currently on TPN.  She pulled NG tube out Hopefully advance diet in the near future  Anemia of critical illness Plan Trend CBC  Hyperglycemia Plan  Sliding scale insulin  Best practice/Goals of care  DVT prophylaxis: LMWH GI prophylaxis: PPI Diet: Currently n.p.o. with NG tube in place to suction Mobility: BR Code Status: Full Code Family Communication: Patient has been updated at bedside 02/24/2018.   Disposition / Summary of Today's Plan 03/03/2018    Erick Colace ACNP-BC [place] Pager # [phone] OR # [phone] if no answer

## 2018-03-03 NOTE — Progress Notes (Signed)
Occupational Therapy Treatment Patient Details Name: Jo Parker MRN: [id number] DOB: 1946-01-12 Today's Date: 03/03/2018    History of present illness 72 yo female with history of COPD,MVP,anxiety/depression,ovarian cancer, admitted  01/27/18 with pelvic abcess. s/P  interval debulking surgery with Dr. Skeet Latch on 01/27/18 consisting of an exploratory laparotomy infra-gastric omentectomy bilateral salpingo-oophorectomy radical optimal tumor debulking, resection of anterior abdominal wall, complicated by findings to suggest bowel perforation on CT from 8/26/  required reexploration with VDRF due to aspiration. Tracheostomy on 02/17/18 . Currently on  Trach collar, had trial of PMV with speech      Follow Up Recommendations  LTACH    Equipment Recommendations  (to be further assessed)    Recommendations for Other Services      Precautions / Restrictions Precautions Precautions: Fall;Other (comment) Precaution Comments: NGT, JP drain, Trach collar, Flexiseal. profound weakness from long term vent. Restrictions Weight Bearing Restrictions: No       Mobility Bed Mobility Overal bed mobility: Needs Assistance             General bed mobility comments: Did educated pt and RN on bridging to A with hygiene.  Pt was able to A with Max A  Transfers           NT- declined OOB                ADL either performed or assessed with clinical judgement   ADL Overall ADL's : Needs assistance/impaired     Grooming: Maximal assistance Grooming Details (indicate cue type and reason): hand over hand A                               General ADL Comments: OT session focused on positioning and PROM of BUE. Pt initially resisting OT with PROM but with VC pt allowed OT to PROM her BUE.  Pt did A at times .  OT did observe pt scratch face.  Spoke with RN regarding positioning     Vision Patient Visual Report: No change from baseline            Cognition  Arousal/Alertness: Awake/alert Behavior During Therapy: WFL for tasks assessed/performed Overall Cognitive Status: Difficult to assess Area of Impairment: Problem solving                   Current Attention Level: Selective   Following Commands: Follows one step commands with increased time;Follows one step commands inconsistently     Problem Solving: Slow processing;Decreased initiation;Requires verbal cues;Requires tactile cues General Comments: patient  follows commands inconsistently, requires hand over hand  at times to follow functional commands/delayed/decr initiation             Frequency  Min 2X/week        Progress Toward Goals  OT Goals(current goals can now be found in the care plan section)  Progress towards OT goals: OT to reassess next treatment     Plan Discharge plan remains appropriate       AM-PAC PT "6 Clicks" Daily Activity     Outcome Measure   Help from another person eating meals?: Total Help from another person taking care of personal grooming?: A Lot Help from another person toileting, which includes using toliet, bedpan, or urinal?: Total Help from another person bathing (including washing, rinsing, drying)?: A Lot Help from another person to put on and taking off regular upper body clothing?: Total Help from another  person to put on and taking off regular lower body clothing?: Total 6 Click Score: 8    End of Session    OT Visit Diagnosis: Muscle weakness (generalized) (M62.81)   Activity Tolerance Patient limited by fatigue   Patient Left in bed;with call bell/phone within reach;with bed alarm set   Nurse Communication          Time: 1400-1415 OT Time Calculation (min): 15 min  Charges: OT General Charges $OT Visit: 1 Visit OT Treatments $Therapeutic Activity: 8-22 mins  Kari Baars, [place] [phone] Office- [phone]      Ida Milbrath, Edwena Felty D 03/03/2018, 2:49  PM

## 2018-03-03 NOTE — Progress Notes (Addendum)
[place] NOTE   Pharmacy Consult for TPN Indication: ileus  Patient Measurements: Height: 5\' 3"  (160 cm) Weight: 121 lb 0.5 oz (54.9 kg) IBW/kg (Calculated) : 52.4 TPN AdjBW (KG): 58.3 Body mass index is 21.44 kg/m.  Body mass index is 20.62 kg/m. Filed Weights   03/01/18 0500 03/02/18 0400 03/03/18 0500  Weight: 121 lb 7.6 oz (55.1 kg) 110 lb 3.7 oz (50 kg) 116 lb 6.5 oz (52.8 kg)    HPI: Pt is a 72 year old with ovarian cancer status post bilateral salpingo-oophorectomy and omentectomy with debulking on 8/22. Ex lap on 8/26 and transferred to ICU afterward. Remains critically ill on vent support, now s/p trach placement. Was previously on TPN 8/27-9/5, however has lately had issues tolerating enteral nutrition. TFs held since 9/17 d/t abdominal distention; CT abd shows persistent ileus. NG output remains high. [facility] resuming TPN per pharmacy on 9/19.  Significant events:  - 8/27 Start TPN. Hold lipids for first 7 days while pt in ICU  - 8/30 scheduled Novolog insulin added q4h - 9/2 CT scan of abdomenshowsundrained left pelvic fluid collection, IR consulted - defer drainage for now - 9/4 Lipids started with TPN. Tube feeds (Vital AF 1.2 Cal) initiated at 63mL/hr, order to increase by 54mL/hr every 8 hours to goal rate of 19mL/hr - 9/5 TPN weaned off - 9/19 resume TPN  - Abd Xray 9/24 shows some improvement in ileus; passing gas & having BMs. Plan repeat 9/25 and consider clamping NGT - 9/26: patient pulled out NG tube  Insulin requirements past 24 hours: 19 units resistant SSI / 24 hrs  (no hx DM) Current Nutrition: NPO, TF held  IVF: NaCl KVO  Central access: 02/01/18 TPN start date: 8/27-9/5, restart 9/19  ASSESSMENT                                                                                                          Today:   Glucose:  CBGs 108-145 (slightly increasing with conversion to custom TPN)  Electrolytes:  (Goals K >4, Mg >2) K improved to 4.5, Mag 2.0, Phos incr 4.5  Lasix 20 mg IV daily  NG out 9/25  Renal:  SCr 0.52, UOP 2.3L  LFTs:  WNL   TGs:  108 (9/23)  Prealbumin: 3.5 (9/2), 10.6 (9/20), 8.7 (9/23)   NUTRITIONAL GOALS                                                                                             RD recs (9/17):  82-93g Protein, 1750-1925 Kcal per day TPN providing: 90 g of protein and 1758 kCals per day meeting 100% of protein and 100% of  kCal needs.  PLAN                                                                                                                         TPN at 22ml/hr at goal rate This TPN provides 90 g of protein, 270 g of dextrose, and 54 g of lipids for a total of 1818 kcals meeting 100 % of patient needs Electrolytes in TPN: Na 74mEq/L, K decr to 78mEqL, Ca 5 mEqL, Mg 10 mEq/L, Phos 10 mmol/L,  Cl:Ac 1:1 Contains standard MVI, trace elements, and famotidine 40 mg per day Continue resistant SSI and adjust as needed BMET, Mag, Phos in am F/U daily  Minda Ditto PharmD Pager [phone] 03/03/2018, 2:20 PM

## 2018-03-03 NOTE — Evaluation (Signed)
Clinical/Bedside Swallow Evaluation Patient Details  Name: Jo Parker MRN: [id number] Date of Birth: 11-Feb-1946  Today's Date: 03/03/2018 Time: SLP Start Time (ACUTE ONLY): 0810 SLP Stop Time (ACUTE ONLY): 0858 SLP Time Calculation (min) (ACUTE ONLY): 48 min  Past Medical History:  Past Medical History:  Diagnosis Date  . Anxiety and depression   . Aortic aneurysm ([place])    see ov note 03/2017   . Arthritis   . Carcinomatosis ([place]) 10/2017  . COPD (chronic obstructive pulmonary disease) ([place]) 10/23/2017  . Depression   . Family history of breast cancer   . Family history of prostate cancer   . Family history of uterine cancer   . History of diverticulitis   . HLD (hyperlipidemia) 10/23/2017  . Hypercholesterolemia   . Hypertension   . Mitral valve prolapse   . Sleep apnea    cpap machine mask and tubing in [place] per patient.  Patient does not have mask or tubing to bring on DOS of 01/27/2018.   Marland Kitchen Uterine cancer ([place])    had partial hysterectomy at 27   Past Surgical History:  Past Surgical History:  Procedure Laterality Date  . ABDOMINAL HYSTERECTOMY    . cardiac monitor implant    . CEREBRAL ANEURYSM REPAIR    . CERVICAL SPINE SURGERY     bone spurs  . DEBULKING N/A 01/27/2018   Procedure: RADICAL DEBULKING;  Surgeon: [name], MD;  Location: [facility];  Service: Gynecology;  Laterality: N/A;  . IR FLUORO GUIDE PORT INSERTION RIGHT  10/14/2017  . IR PARACENTESIS  10/14/2017  . IR US GUIDE VASC ACCESS RIGHT  10/14/2017  . LAPAROTOMY N/A 01/27/2018   Procedure: EXPLORATORY LAPAROTOMY WITH REMOVAL OF BILATERAL TUBES AND OVARIES;  Surgeon: [name], MD;  Location: [facility];  Service: Gynecology;  Laterality: N/A;  . LAPAROTOMY N/A 01/31/2018   Procedure: EXPLORATORY LAPAROTOMY, ABDOMINAL WASHOUT;  Surgeon: Isabel Caprice, MD;  Location: [facility];  Service: Gynecology;  Laterality: N/A;  . OMENTECTOMY N/A 01/27/2018   Procedure: OMENTECTOMY;  Surgeon: [name],  MD;  Location: [facility];  Service: Gynecology;  Laterality: N/A;  . 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  She is completed 3 cycles of chemotherapy and is now status post bilateral salpingo-oophorectomy and omentectomy with debulking on 8/22.   Post op complication for possible bowel obstruction and presumed aspiration during intubation.   Acute on chronic hypoxemic respiratory failure requiring prolonged intubation (Pt was intubated 8/26 and tracheostomy was placed 9/14) and now tracheostomy, tolerating TCT and no longer requiring ventilator use.  PMSV trials started last week - with improved tolerance Friday 02/25/2018.  Pt pulled out NG on 9/25 and was also changed to a 4 cufless trach making swallow assessment more reasonable with needs for oral route for medication.    Assessment / Plan / Recommendation Clinical Impression  Pt demonstrates poor readiness for PO diet or instrumental exam today. She is awake, but mental status different from baseline with decreased sustained attention or initaition. When asked if she would like to try ice, she nodded yes and accepted ice. Oral transit initially swift and swallow trigger subjectively timely and strong, but resulting in grimacing with pt admitting to throat pain. Pt orally held small sip of water given subsequently, indicating  conscious avoidance of pain and eventual swallow followed by hard immediate productive cough response. Pt demonstrates good potential for swallowing,  but possible pharyngeal/laryngeal irritation following prolonged use of large bore NG and prolonged oral intubation (though extubated almost 2 weeks ago) are likely impacting airway protection and pt comfort. Recommend ongoing NPO status until subjective improvment observed with swallow function.  SLP Visit Diagnosis: Dysphagia, oropharyngeal phase (R13.12)    Aspiration Risk   Severe aspiration risk    Diet Recommendation Alternative means - temporary;NPO   Medication Administration: Via alternative means    Other  Recommendations Oral Care Recommendations: Oral care QID   Follow up Recommendations LTACH      Frequency and Duration min 2x/week  2 weeks       Prognosis        Swallow Study   General HPI: 72 year old African-American female with diagnosis of ovarian adenocarcinoma possible stage IVb.  She is completed 3 cycles of chemotherapy and is now status post bilateral salpingo-oophorectomy and omentectomy with debulking on 8/22.   Post op complication for possible bowel obstruction and presumed aspiration during intubation.   Acute on chronic hypoxemic respiratory failure requiring prolonged intubation (Pt was intubated 8/26 and tracheostomy was placed 9/14) and now tracheostomy, tolerating TCT and no longer requiring ventilator use.  PMSV trials started last week - with improved tolerance Friday 02/25/2018.  Pt pulled out NG on 9/25 and was also changed to a 4 cufless trach making swallow assessment more reasonable with needs for oral route for medication.  Type of Study: Bedside Swallow Evaluation Diet Prior to this Study: NPO;TNA Respiratory Status: Room air History of Recent Intubation: Yes Length of Intubations (days): 20 days Date extubated: 02/19/18 Behavior/Cognition: Requires cueing;Doesn't follow directions Oral Cavity Assessment: Other (comment)(Pt would not allow oral assessment) Oral Care Completed by SLP: Recent completion by staff Oral Cavity - Dentition: Adequate natural dentition Self-Feeding Abilities: Needs assist Patient Positioning: Upright in bed Baseline Vocal Quality: Not observed Volitional Cough: Cognitively unable to elicit(involuntary cough strong) Volitional Swallow: Able to elicit    Oral/Motor/Sensory Function Overall Oral Motor/Sensory Function: Other (comment)(would not follow commands, but quite strong in  resisting)   Ice Chips Ice chips: Impaired Presentation: Spoon Oral Phase Functional Implications: Oral holding;Prolonged oral transit   Thin Liquid Thin Liquid: Impaired Presentation: Cup Oral Phase Functional Implications: Oral holding Pharyngeal  Phase Impairments: Cough - Immediate    Nectar Thick Nectar Thick Liquid: Not tested   Honey Thick Honey Thick Liquid: Not tested   Puree Puree: (refused)   Solid     Solid: Not tested      Raaga Maeder, Katherene Ponto 03/03/2018,11:00 AM

## 2018-03-03 NOTE — Progress Notes (Addendum)
Gynecologic Oncology Progress Note  72 y.o. female diagnosed with Stage IV ovarian cancer who received four cycles of neoadjuvant chemotherapy with carboplatin and taxol with fourth cycle completed on 12/20/17 .  She underwent interval debulking surgery with Dr. Skeet Latch on 01/27/18 consisting of an exploratory laparotomy infra-gastric omentectomy bilateral salpingo-oophorectomy radical optimal tumor debulking, resection of anterior abdominal wall.  Her post-operative course was complicated by findings to suggest bowel perforation on CT from 01/31/18 post-op with no site visualized on re-operation. It was felt that aspiration most likely occurred during intubation with the re-operation.          Subjective: Patient awake, will turn head towards person speaking to her but nonverbal.  Will nod head intermittently to questions.  Son, husband, and RN at the bedside.  Family concerned about changes in mental status and patient being nonverbal. Son telling mother "you have to fight."   Objective: Vital signs in last 24 hours: Temp:  [99 F (37.2 C)-100.8 F (38.2 C)] 99.1 F (37.3 C) (09/26 0804) Pulse Rate:  [78-123] 105 (09/26 0630) Resp:  [0-45] 23 (09/26 0630) BP: (100-190)/(41-83) 162/63 (09/26 0600) SpO2:  [92 %-99 %] 96 % (09/26 0630) Weight:  [116 lb 6.5 oz (52.8 kg)] 116 lb 6.5 oz (52.8 kg) (09/26 0500) Last BM Date: 03/02/18(flexi seal)  Intake/Output from previous day: 09/25 0701 - 09/26 0700 In: 2004.4 [I.V.:1954.4; IV Piggyback:50] Out: 2575 [Urine:2075; Emesis/NG output:400; Stool:100]  Physical Examination:  General: alert, flat affect, appears comfortable, nodding head when asked questions  Cardio: HR 85 bpm  Resp: Clear breath sounds.  Trach in place.  Abdomen: Abdomen more soft, normoactive bowel sounds in all quadrants.  Less tympanic       on percussion. Nontender, no rebound or guarding. Mildy distended.   Wound VAC in place to suction.  250 cc in canister    LLQ JP  with minimal serous output   Extremities: SCD in place, no edema  Flexiseal in place with around 125 cc output marked from yesterday.  Urine wick in place on suction with clear urine in canister  Labs: WBC/Hgb/Hct/Plts:  8.5/8.3/25.9/261 (09/26 0546) BUN/Cr/glu/ALT/AST/amyl/lip:  24/0.52/--/17/25/--/-- (09/26 0546)    Assessment: 72 y.o. s/p Procedure(s): Interval debulking surgery with Dr. Skeet Latch on 01/27/18 consisting of an exploratory laparotomy infra-gastric omentectomy bilateral salpingo-oophorectomy radical optimal tumor debulking, resection of anterior abdominal wall.  Reoperation of EXPLORATORY LAPAROTOMY, ABDOMINAL WASHOUT on 01/31/18:   Neuro: Awake but closing eyes intermittently, non-verbal since 03/02/18. Fentanyl patch in place. PRN IV Fentanyl discontinued along with risperdal changed to HS.  Pain: Fentanyl patch in place.  Plan to continue IV Tylenol.    Heme: Post-operative anemia: Hgb 8.3 and Hct 25.9 this am. Last transfusion 02/19/18.  Anemia related to acute critical illness.   ID: Low grade temperature as high as 100.8 on 03/03/18.  IV antibiotics completed 02/28/18 after one month of therapy. Plan to obtain urine culture, blood cultures, chest xray per CC.  Presumed sepsis initially from gastrointestinal source but unable to identify the perforation after exp lap on 01/31/18. Contribution from pneumonia and possibly undrained left pelvic fluid collection seen on CT scan 02/07/18; however the pelvic collection was improving on last scan.   Followup CT's have shown decrease in pelvic fluid collection.  CV: Tachycardia and hypertension managed per Hospitalist.   Resp: ARDS in setting of aspiration pneumonia and sepsis.  Trach in place. Per ICU/Pulm/Critical care.   GI: Abd 1 view 03/02/18 am: improving ileus. NG pulled out 03/02/18  and ordered to stay out. -CT AP on 01/31/18 with findings consistent with GI perforation.  No obvious perforation identified on re-operation on  01/31/18. - Flexiseal in place; nursing states with gas at times -TPN initially started 02/02/18 and discontinued 02/10/18 after tube feedings began. TPN restarted due to ileus 02/24/18 - Persistent abdominal distension improved- CT AP 9/18- ileus.    GU: AKI post-op resolved. Creatinine 0.52 this am.  Purewick in place to suction with normal appearing urine in canister.   FEN: Replacement of K+ per Hospitalist /CC- K+ 4.5 this am, Mag 2, Phosp 4.5  Prophylaxis: SCDs in place. Continue Lovenox prophylaxis. No active bleeding.  ADL:  - PTx working with patient. Pt to the chair with assist. - SLP working with patient on speech.   Plan: 1. Wound VAC to continue  2. Continue TPN 3. IV Tylenol ordered 5.         Medication adjustments per CC. 6.         Continue plan per Hospitalist/CC/ID 7.         Dr. Skeet Latch to see patient later today 8.         Chest xray this am, blood cultures, urine culture per CC   Melissa D Cross 03/03/2018, 9:45 AM

## 2018-03-03 NOTE — Progress Notes (Signed)
PROGRESS NOTE    Jo Parker  KPT:[id number] DOB: September 26, 1945 DOA: 01/27/2018 PCP: [name], MD    Brief Narrative:  72 year old African-American female with presumed diagnosis of ovarian adenocarcinoma felt stage IVb. She is completed 3 cycles of chemotherapy and is now status post bilateral salpingo-oophorectomy and omentectomy with debulking on 8/22.   8/26 transferred to ICU w/ concern for perforation w/ free air noted. Imipenem started. Went for exploratory laparotomy with washout. No significant contamination identified, presumed spontaneously closed microperf. Returned with abdominal drains postoperative chest x-ray showing evidence of aspiration pneumonia; mixed respiratory and metabolic acidosis &in septic shock. Required intubation.   8/27 GI medicine consulted, did not feel she had overt GIB. Rec's prophylaxis with PPI. Signed off. Weaning pressors. Hgb dropped from 8.8 to 7.4; got 2 units of blood, febrile, added. vancomycin. Neupogen given for neutropenia.   8/28-9/1 Shock resolved, TPN started. Fevers. Delirium barrier to weaning. ABX for fever  9/02-9/6 Delirium. Sedation difficult, diuresing, remains on abx. spiking temps, loculated fluid collection identified in left pelvis. Not amedable to drainage. ID consulted. Started seroquel. 9/4. Upper 2 drains removed. Staples removed.from incision site.  9/4-9/9 ID consulted by family. Fevers ongoing, CT abd with improved fluid collections. Failing PSV wean. No pressors. No sedation. Husband deferring decisions about care to son (newly married).   9/10 Sister at bedside - says she is not in favor of trach but says is the decision of son/dpoa. Son met with Dr Alvy Bimler for goals of care - current status precludes chemo but goal is trach and full code and full medical care and later chemo when ECOG improves. Low grade fever persists - ID following. More awake today. No Perf leak on CT abd and restarted  TF  9/12-9/14s/p Trach - Dr Nelda Marseille. Levophed post trach. Fevers. 1 unit PRBC,   9/14 - [facility] consulted for medical management   9/18 - CT abdomen with ileus   9/22 - Off Vent completely, tolerating well ATC   9/26 - Patient accepted to [facility]:   Principal Problem:   Pelvic abscess in female Active Problems:   Primary peritoneal carcinomatosis ([place])   Severe protein-calorie malnutrition ([place])   Severe sepsis ([place])   COPD (chronic obstructive pulmonary disease) ([place])   HLD (hyperlipidemia)   Absolute anemia   White coat syndrome with hypertension   Ovarian cancer ([place])   AKI (acute kidney injury) ([place])   Acute metabolic encephalopathy   Acute respiratory distress   Low urine output   Somnolence   At risk for fluid volume overload   Free intraperitoneal air   Acute on chronic respiratory failure ([name])   Pressure injury of skin   Status post bilateral salpingo-oophorectomy   Peritonitis ([place])   HCAP (healthcare-associated pneumonia)   Postoperative wound infection   Aspiration pneumonia ([name])   Wound dehiscence, surgical, initial encounter   Tracheostomy care ([place])  Acute on chronic hypoxemic respiratory failure status post trach (9/12) - continues to improve -In setting of aspiration pneumonia/ARDS.  -Noted to have right-sided pleural effusion, per PCCM no significant pocket to drain on bedside ultrasound.  -PCCM had been following for trach management, since downsized trach on 9/25  Status post exploratory laparotomy for free air on 8/26; following bilateral salpingo-oophorectomy, omentectomy and debulking surgery on 8/22. S/p I&D from wound dehiscence 9/19   -Continue with management per GYN/ONC  -Sedating pain meds were discontinued 9/26 secondary to increased lethargy, mentation improved -Will continue on PRN toradol for moderate pain.  Cr reviewed <1  Abdominal pain/Ileus  -Surgical service following -Chart reviewed. Patient pulled out  NG 9/25  Severe sepsis pelvic abscess, peritonitis -  -sepsis physiology resolved -Initially with peritonitis (microperforation) subsequently complicated by pneumonia. -Completed antibiotic therapy with Unasyn, Eraxis and vancomycin for 1 month.   -Antibiotics were stopped on 9/23.  -patient with low grade temps of just over 100F overnight. ID recommends continuing to hold off on abx while awaiting blood and urine cultures. CXR with persistent infiltrates    HTN  -BP overall stable, currently on max IV metoprolol and IV Vasotec.   -Clonidine patch was added -Continue hydralazine and labetalol as needed.  -Echo LVEF 65 to 70% moderate LVH, grade 1 diastolic dysfunction.  -Had been continued on Lasix 20 mg IV daily.   Severe protein calorie malnutrition -Continue to hold tube feedings due to ileus, likely malabsorption  -Continue TPN and monitor electrolytes per pharmacy   Postoperative anemia - hemoglobin stable and trending up -Status post 4 unit PRBCs during hospital stay, Hgb stable  -Recommending transfusing if hemoglobin less than 7.  Hypokalemia/hypomagnesemia -Replete as needed, keep K > 4 and mag > 2  Acute metabolic encephalopathy  -Mentation noted to be worse overnight and this AM -Concern for possible medication induced encephalopathy -Sedating meds were held with improvement in mentation -continued on PRN IV toradol for moderate pain for now  Deconditioning  Patient will benefit from LTAC  Continue PT per ICU protocol  Out of bed as tolerated.  DVT prophylaxis: Lovenox subQ Code Status: Full Family Communication: Pt in room, family at bedside Disposition Plan: LTAC  Procedures:     Antimicrobials: Anti-infectives (From admission, onward)   Start     Dose/Rate Route Frequency Ordered Stop   02/22/18 1400  vancomycin (VANCOCIN) IVPB 750 mg/150 ml premix  Status:  Discontinued     750 mg 150 mL/hr over 60 Minutes Intravenous Every 24 hours 02/22/18  1335 02/22/18 1337   02/22/18 1400  vancomycin (VANCOCIN) IVPB 1000 mg/200 mL premix  Status:  Discontinued     1,000 mg 200 mL/hr over 60 Minutes Intravenous Every 24 hours 02/22/18 1337 02/28/18 1622   02/22/18 1200  vancomycin (VANCOCIN) IVPB 750 mg/150 ml premix  Status:  Discontinued     750 mg 150 mL/hr over 60 Minutes Intravenous Every 24 hours 02/22/18 0903 02/22/18 1243   02/17/18 1200  vancomycin (VANCOCIN) IVPB 750 mg/150 ml premix  Status:  Discontinued     750 mg 150 mL/hr over 60 Minutes Intravenous Every 24 hours 02/16/18 1518 02/22/18 0521   02/16/18 1800  Ampicillin-Sulbactam (UNASYN) 3 g in sodium chloride 0.9 % 100 mL IVPB  Status:  Discontinued     3 g 200 mL/hr over 30 Minutes Intravenous Every 6 hours 02/16/18 1619 02/28/18 1622   02/07/18 1200  vancomycin (VANCOCIN) IVPB 1000 mg/200 mL premix  Status:  Discontinued     1,000 mg 200 mL/hr over 60 Minutes Intravenous Every 24 hours 02/06/18 1315 02/16/18 1518   02/06/18 1015  vancomycin (VANCOCIN) IVPB 1000 mg/200 mL premix     1,000 mg 200 mL/hr over 60 Minutes Intravenous STAT 02/06/18 1012 02/06/18 1211   02/02/18 1000  anidulafungin (ERAXIS) 100 mg in sodium chloride 0.9 % 100 mL IVPB  Status:  Discontinued     100 mg 78 mL/hr over 100 Minutes Intravenous Every 24 hours 02/01/18 0916 02/28/18 1622   02/02/18 1000  vancomycin (VANCOCIN) IVPB 1000 mg/200 mL premix  Status:  Discontinued  1,000 mg 200 mL/hr over 60 Minutes Intravenous Every 24 hours 02/01/18 1039 02/04/18 0852   02/01/18 1000  anidulafungin (ERAXIS) 200 mg in sodium chloride 0.9 % 200 mL IVPB     200 mg 78 mL/hr over 200 Minutes Intravenous  Once 02/01/18 0916 02/01/18 1602   02/01/18 0830  vancomycin (VANCOCIN) 1,250 mg in sodium chloride 0.9 % 250 mL IVPB     1,250 mg 166.7 mL/hr over 90 Minutes Intravenous STAT 02/01/18 0822 02/01/18 1035   01/31/18 1800  meropenem (MERREM) 1 g in sodium chloride 0.9 % 100 mL IVPB  Status:  Discontinued      1 g 200 mL/hr over 30 Minutes Intravenous Every 8 hours 01/31/18 1012 02/16/18 1619   01/31/18 0900  meropenem (MERREM) 1 g in sodium chloride 0.9 % 100 mL IVPB     1 g 200 mL/hr over 30 Minutes Intravenous STAT 01/31/18 0852 01/31/18 1005   01/27/18 0600  cefOXitin (MEFOXIN) 2 g in sodium chloride 0.9 % 100 mL IVPB     2 g 200 mL/hr over 30 Minutes Intravenous On call to O.R. 01/27/18 0533 01/27/18 0753      Subjective: Drowsy this AM  Objective: Vitals:   03/03/18 1143 03/03/18 1200 03/03/18 1538 03/03/18 1557  BP:  137/69    Pulse:  81 92   Resp:  (!) 32 (!) 29   Temp: 98.8 F (37.1 C)   98.1 F (36.7 C)  TempSrc: Oral   Oral  SpO2:  97% 99%   Weight:      Height:        Intake/Output Summary (Last 24 hours) at 03/03/2018 1728 Last data filed at 03/03/2018 1131 Gross per 24 hour  Intake 1722.47 ml  Output 1375 ml  Net 347.47 ml   Filed Weights   03/01/18 0500 03/02/18 0400 03/03/18 0500  Weight: 55.1 kg 50 kg 52.8 kg    Examination: General exam: arousable, in no acute distress Respiratory system: normal chest rise, clear, no audible wheezing Cardiovascular system: regular rhythm, s1-s2 Gastrointestinal system: Nondistended, nontender, pos BS Central nervous system: No seizures, no tremors Extremities: No cyanosis, no joint deformities Skin: No rashes, no pallor Psychiatry: difficult to assess as pt is not conversant at this time    Data Reviewed: I have personally reviewed following labs and imaging studies  CBC: Recent Labs  Lab 02/27/18 0455 02/28/18 0459 03/01/18 0240 03/02/18 0459 03/03/18 0546  WBC 9.2 9.3 9.9 9.0 8.5  NEUTROABS 6.6 6.7 7.3 6.6 5.0  HGB 9.0* 8.8* 9.6* 9.1* 8.3*  HCT 28.2* 27.2* 29.1* 27.7* 25.9*  MCV 80.3 78.6 76.6* 76.5* 77.5*  PLT 291 282 300 314 924   Basic Metabolic Panel: Recent Labs  Lab 02/27/18 0455 02/28/18 0459 03/01/18 0240 03/02/18 0459 03/03/18 0546  NA 140 136 137 138 138  K 3.0* 3.1* 3.4* 4.2 4.5  CL  102 99 100 104 107  CO2 _0 GLUCOSE 140* 121* 141* 185* 137*  BUN _1 24*  CREATININE 0.47 0.39* 0.38* 0.50 0.52  CALCIUM 8.4* 8.1* 8.3* 8.6* 8.4*  MG 1.8 1.7 1.9 1.8 2.0  PHOS 3.8 3.3 3.1 3.7 4.5   GFR: Estimated Creatinine Clearance: 52.6 mL/min (by C-G formula based on SCr of 0.52 mg/dL). Liver Function Tests: Recent Labs  Lab 02/25/18 0455 02/28/18 0459 03/01/18 0240 03/02/18 0459 03/03/18 0546  AST _2 ALT _3 ALKPHOS  79 94 114 119 130*  BILITOT 0.8 0.2* 0.4 0.3 0.5  PROT 7.5 6.4* 6.9 6.7 6.9  ALBUMIN 2.0* 1.7* 1.8* 1.9* 1.9*   No results for input(s): LIPASE, AMYLASE in the last 168 hours. No results for input(s): AMMONIA in the last 168 hours. Coagulation Profile: No results for input(s): INR, PROTIME in the last 168 hours. Cardiac Enzymes: No results for input(s): CKTOTAL, CKMB, CKMBINDEX, TROPONINI in the last 168 hours. BNP (last 3 results) No results for input(s): PROBNP in the last 8760 hours. HbA1C: No results for input(s): HGBA1C in the last 72 hours. CBG: Recent Labs  Lab 03/02/18 2001 03/03/18 0138 03/03/18 0336 03/03/18 0731 03/03/18 1138  GLUCAP 112* 143* 131* 133* 108*   Lipid Profile: No results for input(s): CHOL, HDL, LDLCALC, TRIG, CHOLHDL, LDLDIRECT in the last 72 hours. Thyroid Function Tests: No results for input(s): TSH, T4TOTAL, FREET4, T3FREE, THYROIDAB in the last 72 hours. Anemia Panel: No results for input(s): VITAMINB12, FOLATE, FERRITIN, TIBC, IRON, RETICCTPCT in the last 72 hours. Sepsis Labs: No results for input(s): PROCALCITON, LATICACIDVEN in the last 168 hours.  No results found for this or any previous visit (from the past 240 hour(s)).   Radiology Studies: Dg Abd 1 View  Result Date: 03/02/2018 CLINICAL DATA:  Ileus EXAM: ABDOMEN - 1 VIEW COMPARISON:  02/28/2018 FINDINGS: Stable NG tube. Surgical drains projecting over the left hemipelvis are stable. Bowel distension has  again improved. Minimally distended small bowel loops continue to project over the abdomen. There is gas in the colon. IMPRESSION: Improving ileus. Electronically Signed   By: Marybelle Killings M.D.   On: 03/02/2018 10:42   Ct Head Wo Contrast  Result Date: 03/03/2018 CLINICAL DATA:  Unexplained altered level of consciousness. EXAM: CT HEAD WITHOUT CONTRAST TECHNIQUE: Contiguous axial images were obtained from the base of the skull through the vertex without intravenous contrast. COMPARISON:  None. FINDINGS: Brain: Moderately enlarged ventricles and subarachnoid spaces. Moderate patchy white matter low density in both cerebral hemispheres. No intracranial hemorrhage, mass lesion or CT evidence of acute infarction. Vascular: No hyperdense vessel or unexpected calcification. Skull: Left frontal post craniotomy changes. Sinuses/Orbits: Unremarkable. 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 IMPRESSION: 1. No acute abnormality. 2. Moderate diffuse cerebral and cerebellar atrophy. 3. Moderate chronic small vessel white matter ischemic changes in both cerebral hemispheres. 4. 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 Electronically Signed   By: Claudie Revering M.D.   On: 03/03/2018 13:50   Dg Chest Port 1 View  Result Date: 03/03/2018 CLINICAL DATA:  Fever, history COPD, uterine cancer, carcinomatosis, hypertension EXAM: PORTABLE CHEST 1 VIEW COMPARISON:  Portable exam 0750 hours compared to 02/23/2018 FINDINGS: Tracheostomy tube projects over tracheal air column. RIGHT jugular Port-A-Cath with tip projecting over SVC, stable. Loop recorder projects over lower LEFT chest. LEFT arm PICC line tip projects over SVC. Borderline enlargement of cardiac silhouette. Persistent RIGHT upper and RIGHT lower lobe infiltrates. Additional scattered opacities in LEFT lung likely infiltrate, greatest at base. No pleural effusion or pneumothorax. Bones demineralized. IMPRESSION: Persistent pulmonary  infiltrates greater on RIGHT. Electronically Signed   By: Lavonia Dana M.D.   On: 03/03/2018 09:49    Scheduled Meds: . chlorhexidine  15 mL Mouth Rinse BID  . cloNIDine  0.2 mg Transdermal Weekly  . enalaprilat  1.25 mg Intravenous Q6H  . enoxaparin (LOVENOX) injection  40 mg Subcutaneous Q24H  . fentaNYL  25 mcg Transdermal Q72H  . furosemide  20  mg Intravenous Daily  . insulin aspart  0-20 Units Subcutaneous Q4H  . ipratropium-albuterol  3 mL Nebulization TID  . mouth rinse  15 mL Mouth Rinse q12n4p  . metoprolol tartrate  10 mg Intravenous Q6H  . risperiDONE  0.5 mg Oral QHS  . sodium chloride flush  10-40 mL Intracatheter Q12H   Continuous Infusions: . sodium chloride 10 mL/hr at 03/02/18 1839  . sodium chloride 10 mL/hr at 03/02/18 1839  . TPN ADULT (ION) 75 mL/hr at 03/03/18 1131  . TPN ADULT (ION)       LOS: 35 days   Marylu Lund, MD [facility] Pager On Amion  If 7PM-7AM, please contact night-coverage 03/03/2018, 5:28 PM

## 2018-03-04 ENCOUNTER — Inpatient Hospital Stay
Admission: RE | Admit: 2018-03-04 | Discharge: 2018-03-23 | Disposition: A | Discharge status: Home / Self Care | Payer: Medicare PPO | Source: Ambulatory Visit | Attending: Internal Medicine | Admitting: Internal Medicine

## 2018-03-04 ENCOUNTER — Telehealth: Payer: Self-pay | Admitting: Oncology

## 2018-03-04 DIAGNOSIS — N179 Acute kidney failure, unspecified: Secondary | ICD-10-CM | POA: Diagnosis present

## 2018-03-04 DIAGNOSIS — J690 Pneumonitis due to inhalation of food and vomit: Secondary | ICD-10-CM

## 2018-03-04 DIAGNOSIS — A419 Sepsis, unspecified organism: Secondary | ICD-10-CM | POA: Diagnosis present

## 2018-03-04 DIAGNOSIS — K567 Ileus, unspecified: Secondary | ICD-10-CM

## 2018-03-04 DIAGNOSIS — J9621 Acute and chronic respiratory failure with hypoxia: Secondary | ICD-10-CM

## 2018-03-04 DIAGNOSIS — R6520 Severe sepsis without septic shock: Secondary | ICD-10-CM

## 2018-03-04 DIAGNOSIS — J449 Chronic obstructive pulmonary disease, unspecified: Secondary | ICD-10-CM | POA: Diagnosis present

## 2018-03-04 DIAGNOSIS — J9690 Respiratory failure, unspecified, unspecified whether with hypoxia or hypercapnia: Secondary | ICD-10-CM

## 2018-03-04 DIAGNOSIS — C482 Malignant neoplasm of peritoneum, unspecified: Secondary | ICD-10-CM | POA: Diagnosis present

## 2018-03-04 HISTORY — DX: Acute and chronic respiratory failure with hypoxia: J96.21

## 2018-03-04 LAB — CBC WITH DIFFERENTIAL/PLATELET
Basophils Absolute: 0 10*3/uL (ref 0.0–0.1)
Basophils Relative: 0 %
EOS ABS: 0.2 10*3/uL (ref 0.0–0.7)
EOS PCT: 3 %
HCT: 26 % — ABNORMAL LOW (ref 36.0–46.0)
Hemoglobin: 8.3 g/dL — ABNORMAL LOW (ref 12.0–15.0)
LYMPHS PCT: 28 %
Lymphs Abs: 1.7 10*3/uL (ref 0.7–4.0)
MCH: 24.9 pg — ABNORMAL LOW (ref 26.0–34.0)
MCHC: 31.9 g/dL (ref 30.0–36.0)
MCV: 77.8 fL — AB (ref 78.0–100.0)
MONO ABS: 0.7 10*3/uL (ref 0.1–1.0)
Monocytes Relative: 11 %
Neutro Abs: 3.5 10*3/uL (ref 1.7–7.7)
Neutrophils Relative %: 58 %
PLATELETS: 256 10*3/uL (ref 150–400)
RBC: 3.34 MIL/uL — AB (ref 3.87–5.11)
RDW: 20.9 % — AB (ref 11.5–15.5)
WBC: 6.1 10*3/uL (ref 4.0–10.5)

## 2018-03-04 LAB — BLOOD GAS, ARTERIAL
Acid-Base Excess: 3.9 mmol/L — ABNORMAL HIGH (ref 0.0–2.0)
Bicarbonate: 27.6 mmol/L (ref 20.0–28.0)
FIO2: 21
O2 Saturation: 95.2 %
Patient temperature: 98.6
pCO2 arterial: 40 mmHg (ref 32.0–48.0)
pH, Arterial: 7.454 — ABNORMAL HIGH (ref 7.350–7.450)
pO2, Arterial: 73.7 mmHg — ABNORMAL LOW (ref 83.0–108.0)

## 2018-03-04 LAB — BASIC METABOLIC PANEL
Anion gap: 7 (ref 5–15)
BUN: 26 mg/dL — AB (ref 8–23)
CO2: 27 mmol/L (ref 22–32)
CREATININE: 0.43 mg/dL — AB (ref 0.44–1.00)
Calcium: 8.7 mg/dL — ABNORMAL LOW (ref 8.9–10.3)
Chloride: 104 mmol/L (ref 98–111)
GFR calc Af Amer: >60 mL/min (ref >60)
GLUCOSE: 109 mg/dL — AB (ref 70–99)
POTASSIUM: 4.5 mmol/L (ref 3.5–5.1)
Sodium: 138 mmol/L (ref 135–145)

## 2018-03-04 LAB — GLUCOSE, CAPILLARY
GLUCOSE-CAPILLARY: 132 mg/dL — AB (ref 70–99)
GLUCOSE-CAPILLARY: 121 mg/dL — AB (ref 70–99)

## 2018-03-04 LAB — MAGNESIUM: MAGNESIUM: 1.8 mg/dL (ref 1.7–2.4)

## 2018-03-04 LAB — PHOSPHORUS: Phosphorus: 4.1 mg/dL (ref 2.5–4.6)

## 2018-03-04 MED ORDER — MORPHINE SULFATE (PF) 2 MG/ML IV SOLN
INTRAVENOUS | Status: AC
  Filled 2018-03-04: qty 1

## 2018-03-04 MED ORDER — CLONIDINE 0.2 MG/24HR TD PTWK: 0.2000 mg | MEDICATED_PATCH | TRANSDERMAL | 12 refills | Status: DC

## 2018-03-04 MED ORDER — FENTANYL 25 MCG/HR TD PT72: 25.0000 ug | MEDICATED_PATCH | TRANSDERMAL | 0 refills | Status: DC

## 2018-03-04 MED ORDER — TRAVASOL 10 % IV SOLN
INTRAVENOUS | Status: DC
  Filled 2018-03-04: qty 900

## 2018-03-04 MED ORDER — HYDRALAZINE HCL 20 MG/ML IJ SOLN: 10.0000 mg | Freq: Four times a day (QID) | INTRAMUSCULAR | Status: DC | PRN

## 2018-03-04 MED ORDER — ENOXAPARIN SODIUM 40 MG/0.4ML ~~LOC~~ SOLN: 40.0000 mg | SUBCUTANEOUS | Status: DC

## 2018-03-04 MED ORDER — KETOROLAC TROMETHAMINE 15 MG/ML IJ SOLN: 15.0000 mg | Freq: Four times a day (QID) | INTRAMUSCULAR | Status: DC | PRN

## 2018-03-04 MED ORDER — MORPHINE SULFATE (PF) 2 MG/ML IV SOLN
1.0000 mg | Freq: Once | INTRAVENOUS | Status: AC
  Administered 2018-03-04: 1 mg via INTRAVENOUS

## 2018-03-04 MED ORDER — ENALAPRILAT 1.25 MG/ML IV SOLN: 1.2500 mg | Freq: Four times a day (QID) | INTRAVENOUS | Status: DC

## 2018-03-04 MED ORDER — STERILE WATER FOR INJECTION IJ SOLN
INTRAMUSCULAR | Status: AC
  Administered 2018-03-04: 10 mL
  Filled 2018-03-04: qty 20

## 2018-03-04 MED ORDER — ALTEPLASE 2 MG IJ SOLR
2.0000 mg | Freq: Once | INTRAMUSCULAR | Status: AC
  Administered 2018-03-04: 2 mg
  Filled 2018-03-04: qty 2

## 2018-03-04 MED ORDER — CHLORHEXIDINE GLUCONATE 0.12 % MT SOLN: 15.0000 mL | Freq: Two times a day (BID) | OROMUCOSAL | 0 refills | Status: DC

## 2018-03-04 MED ORDER — NONFORMULARY OR COMPOUNDED ITEM: 0 refills | Status: DC

## 2018-03-04 MED ORDER — RISPERIDONE 0.5 MG PO TBDP: 0.5000 mg | ORAL_TABLET | Freq: Every day | ORAL | Status: DC

## 2018-03-04 MED ORDER — LABETALOL HCL 5 MG/ML IV SOLN: 10.0000 mg | INTRAVENOUS | Status: DC | PRN

## 2018-03-04 MED ORDER — ONDANSETRON HCL 4 MG/2ML IJ SOLN: 4.0000 mg | Freq: Four times a day (QID) | INTRAMUSCULAR | 0 refills | Status: DC | PRN

## 2018-03-04 MED ORDER — IPRATROPIUM-ALBUTEROL 0.5-2.5 (3) MG/3ML IN SOLN: 3.0000 mL | Freq: Three times a day (TID) | RESPIRATORY_TRACT | Status: DC

## 2018-03-04 MED ORDER — ACETAMINOPHEN 325 MG PO TABS: 650.0000 mg | ORAL_TABLET | Freq: Four times a day (QID) | ORAL | Status: DC | PRN

## 2018-03-04 MED ORDER — ORAL CARE MOUTH RINSE: 15.0000 mL | Freq: Two times a day (BID) | OROMUCOSAL | 0 refills | Status: DC

## 2018-03-04 MED ORDER — INSULIN ASPART 100 UNIT/ML ~~LOC~~ SOLN: 0.0000 [IU] | Freq: Four times a day (QID) | SUBCUTANEOUS | 11 refills | Status: DC

## 2018-03-04 MED ORDER — INSULIN ASPART 100 UNIT/ML ~~LOC~~ SOLN: 0.0000 [IU] | Freq: Four times a day (QID) | SUBCUTANEOUS | Status: DC

## 2018-03-04 MED ORDER — FUROSEMIDE 10 MG/ML IJ SOLN: 20.0000 mg | Freq: Every day | INTRAMUSCULAR | 0 refills | Status: DC

## 2018-03-04 MED ORDER — METOPROLOL TARTRATE 5 MG/5ML IV SOLN: 10.0000 mg | Freq: Four times a day (QID) | INTRAVENOUS | Status: DC

## 2018-03-04 NOTE — Telephone Encounter (Signed)
Jo Primus, NP with [facility] and asked if they will order the CT abd/pelvis on 10/1 or 10/2.  She said they will take care of ordering the CT and will talk to IR afterwards about removal of the JP drain.

## 2018-03-04 NOTE — Discharge Instructions (Signed)
Plan to continue with current orders: TPN.  Swallow eval can be performed in the next day to assess for oral intake.  We will contact you about follow up with Dr. Skeet Latch.  Recommendation is for a CT AP prior to having JP drain removal.  The wound vac dressing changes will continue.  -No heavy lifting over 5 pounds.  Up with assist.

## 2018-03-04 NOTE — Discharge Summary (Addendum)
Physician Discharge Summary  Patient ID: Jo Parker MRN: [id number] DOB/AGE: [birth year] 72 y.o.  Admit date: 01/27/2018 Discharge date: 03/04/2018  Admission Diagnoses: Pelvic abscess in female  Discharge Diagnoses:  Principal Problem:   Pelvic abscess in female Active Problems:   Primary peritoneal carcinomatosis (Jo Parker)   Severe protein-calorie malnutrition (Jo Parker)   Severe sepsis (Jo Parker)   COPD (chronic obstructive pulmonary disease) (Jo Parker)   HLD (hyperlipidemia)   Absolute anemia   White coat syndrome with hypertension   Ovarian cancer (Jo Parker)   AKI (acute kidney injury) (Jo Parker)   Acute metabolic encephalopathy   Acute respiratory distress   Low urine output   Somnolence   At risk for fluid volume overload   Free intraperitoneal air   Acute on chronic respiratory failure (Jo Parker)   Pressure injury of skin   Status post bilateral salpingo-oophorectomy   Peritonitis (Jo Parker)   HCAP (healthcare-associated pneumonia)   Postoperative wound infection   Aspiration pneumonia (Jo Parker)   Wound dehiscence, surgical, initial encounter   Tracheostomy care Jo Parker)   Discharged Condition:  The patient is in stable condition for transfer to Jo Parker.    Hospital Course:  72 y.o. female diagnosed with Stage IV ovarian cancer who received four cycles of neoadjuvant chemotherapy with carboplatin and taxol with fourth cycle completed on 12/20/17 .  She underwent interval debulking Parker with Jo. Skeet Parker on 01/27/18 consisting of an exploratory laparotomy infra-gastric omentectomy bilateral salpingo-oophorectomy radical optimal tumor debulking, resection of anterior abdominal wall.  Her post-operative course was complicated by findings to suggest bowel perforation on CT from 01/31/18 post-op with no site visualized on re-operation. It was felt that aspiration most likely occurred during intubation with the re-operation.  Her post-operative course was complicated by aspiration pneumonia with (per  CC) prolonged course with mechanical ventilation eventually requiring tracheostomy 9/14, post-operative ileus requiring NG tube decompression, pelvic infection.  She was on tube feedings via NG tube prior to the development of an ileus/abdominal distension then she became NPO for bowel rest. She was weaned off ventilator and tracheostomy has been downsized to 4. She is tolerating Passy-Muir valve. Remains mildly encephalopathic.  Chest x-ray from 9/26 shows persistent right upper lobe infiltrate compared to 9/18.  She had low-grade temperature to 100 overnight but no leukocytosis.  She has been treated with antibiotics for E. coli and lactobacillus from JP drain with the plan to observe off of antibiotics currently.  She has severe protein calorie malnutrition and continues on TNA.    She is cleared from a surgical standpoint to begin swallow evaluation and attempt oral intake. There is no contraindication for using her gastrointestinal tract from a surgical standpoint.  She is to continue TPN until she is able to consume adequate oral intake to maintain her nutrition.  The recommendation would be for a CT scan of the abdomen and pelvis on October 1 or 2, 2019 prior to JP drain removal in the pelvis.  She had a CT scan of the head on 03/03/18 due to altered level of consciousness with no acute abnormalities noted.  Her symptoms were felt to be related to PRN IV Fentanyl use.  On discharge day, she was alert and talking.   Hospital Course per Hospitalist Team:  8/26 transferred to ICU w/ concern for perforation w/ free air noted. Imipenem started. Went for exploratory laparotomy with washout. No significant contamination identified, presumed spontaneously closed microperf. Returned with abdominal drains postoperative chest x-ray showing evidence of aspiration pneumonia; mixed respiratory and metabolic acidosis &in  septic shock. Required intubation.   8/27 GI medicine consulted, did not feel she had overt  GIB. Rec's prophylaxis with PPI. Signed off. Weaning pressors. Hgb dropped from 8.8 to 7.4; got 2 units of blood, febrile, added. vancomycin. Neupogen given for neutropenia.   8/28-9/1 Shock resolved, TPN started. Fevers. Delirium barrier to weaning. ABX for fever  9/02-9/6 Delirium. Sedation difficult, diuresing, remains on abx. spiking temps, loculated fluid collection identified in left pelvis. Not amedable to drainage. ID consulted. Started seroquel. 9/4. Upper 2 drains removed. Staples removed.from incision site.  9/4-9/9 ID consulted by family. Fevers ongoing, CT abd with improved fluid collections. Failing PSV wean. No pressors. No sedation. Husband deferring decisions about care to son (newly married).   9/10 Sister at bedside - says she is not in favor of trach but says is the decision of son/dpoa. Son met with Jo Parker for goals of care - current status precludes chemo but goal is trach and full code and full medical care and later chemo when ECOG improves. Low grade fever persists - ID following. More awake today. No Perf leak on CT abd and restarted TF  9/12-9/14s/p Trach - Jo Parker. Levophed post trach. Fevers. 1 unit PRBC,   9/14 - TRH consulted for medical management   9/18 - CT abdomen with ileus   9/22 - Off Vent completely, tolerating well ATC   Consults: CC, Hospitalist, Cardiology, IR, Infectious Disease  Significant Diagnostic Studies: Multiple CT scans (last AP on 02/23/18), chest xrays, abdominal xrays  Treatments: Parker, IV antibiotics, NG tube decompression, IV hydration, TPN  Discharge Exam: Blood pressure (!) 171/87, pulse 90, temperature 99.5 F (37.5 C), resp. rate (!) 22, height _0  (1.6 m), weight 112 lb 7 oz (51 kg), SpO2 98 %. General appearance: alert, no distress and answering questions, smiling intermittently Resp: trach in place, lungs clear Cardio: regular rate and rhythm GI: soft, non-tender; bowel sounds normal; no  masses,  no organomegaly Extremities: extremities normal, atraumatic, no cyanosis or edema Incision/Wound: Abdominal incision with wound VAC in place.    Disposition: [facility]  Discharge Instructions    Call MD for:  difficulty breathing, headache or visual disturbances   Complete by:  As directed    Call MD for:  extreme fatigue   Complete by:  As directed    Call MD for:  hives   Complete by:  As directed    Call MD for:  persistant dizziness or light-headedness   Complete by:  As directed    Call MD for:  persistant nausea and vomiting   Complete by:  As directed    Call MD for:  redness, tenderness, or signs of infection (pain, swelling, redness, odor or green/yellow discharge around incision site)   Complete by:  As directed    Call MD for:  severe uncontrolled pain   Complete by:  As directed    Call MD for:  temperature >100.4   Complete by:  As directed    Diet - low sodium heart healthy   Complete by:  As directed    Increase activity slowly   Complete by:  As directed    Lifting restrictions   Complete by:  As directed    No lifting greater than 10 lbs.   Sexual Activity Restrictions   Complete by:  As directed    No sexual activity, nothing in the vagina, for 8 weeks from Parker.     Allergies as of 03/04/2018  Reactions   Oxycodone    confusion      Medication List    STOP taking these medications   aspirin EC 81 MG tablet   atorvastatin 80 MG tablet Commonly known as:  LIPITOR   budesonide-formoterol 160-4.5 MCG/ACT inhaler Commonly known as:  SYMBICORT   carvedilol 12.5 MG tablet Commonly known as:  COREG   citalopram 20 MG tablet Commonly known as:  CELEXA   flintstones complete 60 MG chewable tablet   HYDROcodone-acetaminophen 10-325 MG tablet Commonly known as:  NORCO   meclizine 25 MG tablet Commonly known as:  ANTIVERT   Melatonin 5 MG Caps   mirtazapine 30 MG tablet Commonly known as:  REMERON   ondansetron 4 MG  tablet Commonly known as:  ZOFRAN Replaced by:  ondansetron 4 MG/2ML Soln injection   polyethylene glycol packet Commonly known as:  MIRALAX / GLYCOLAX   prochlorperazine 10 MG tablet Commonly known as:  COMPAZINE   triamcinolone 55 MCG/ACT Aero nasal inhaler Commonly known as:  NASACORT     TAKE these medications   acetaminophen 325 MG tablet Commonly known as:  TYLENOL Take 2 tablets (650 mg total) by mouth every 6 (six) hours as needed for mild pain, fever or headache.   chlorhexidine 0.12 % solution Commonly known as:  PERIDEX 15 mLs by Mouth Rinse route 2 (two) times daily.   cloNIDine 0.2 mg/24hr patch Commonly known as:  CATAPRES - Dosed in mg/24 hr Place 1 patch (0.2 mg total) onto the skin once a week. Start taking on:  03/08/2018   enalaprilat 1.25 MG/ML injection Commonly known as:  VASOTEC Inject 1 mL (1.25 mg total) into the vein every 6 (six) hours.   enoxaparin 40 MG/0.4ML injection Commonly known as:  LOVENOX Inject 0.4 mLs (40 mg total) into the skin daily.   fentaNYL 25 MCG/HR patch Commonly known as:  DURAGESIC - dosed mcg/hr Place 1 patch (25 mcg total) onto the skin every 3 (three) days.   furosemide 10 MG/ML injection Commonly known as:  LASIX Inject 2 mLs (20 mg total) into the vein daily. Start taking on:  03/05/2018   hydrALAZINE 20 MG/ML injection Commonly known as:  APRESOLINE Inject 0.5-1 mLs (10-20 mg total) into the vein every 6 (six) hours as needed (SBP > 180, DBP > 90).   insulin aspart 100 UNIT/ML injection Commonly known as:  novoLOG Inject 0-20 Units into the skin every 6 (six) hours.   ipratropium-albuterol 0.5-2.5 (3) MG/3ML Soln Commonly known as:  DUONEB Take 3 mLs by nebulization 3 (three) times daily.   ketorolac 15 MG/ML injection Commonly known as:  TORADOL Inject 1 mL (15 mg total) into the vein every 6 (six) hours as needed for moderate pain.   labetalol 5 MG/ML injection Commonly known as:   NORMODYNE,TRANDATE Inject 2 mLs (10 mg total) into the vein every 2 (two) hours as needed (Until SBP < 170 mmHg).   metoprolol tartrate 5 MG/5ML Soln injection Commonly known as:  LOPRESSOR Inject 10 mLs (10 mg total) into the vein every 6 (six) hours.   mouth rinse Liqd solution 15 mLs by Mouth Rinse route 2 times daily at 12 noon and 4 pm.   NONFORMULARY OR COMPOUNDED ITEM Continue TPN   ondansetron 4 MG/2ML Soln injection Commonly known as:  ZOFRAN Inject 2 mLs (4 mg total) into the vein every 6 (six) hours as needed for nausea or vomiting. Replaces:  ondansetron 4 MG tablet   risperiDONE 0.5 MG disintegrating  tablet Commonly known as:  RISPERDAL M-TABS Take 1 tablet (0.5 mg total) by mouth at bedtime.        Greater than thirty minutes were spend for face to face discharge instructions and discharge orders/summary in EPIC.   Signed: Dorothyann Gibbs 03/04/2018, 10:54 AM

## 2018-03-04 NOTE — Care Management Note (Signed)
Case Management Note  Patient Details  Name: Jo Parker MRN: [id number] Date of Birth: 07/02/1945  Subjective/Objective:                  ltach approval  Action/Plan: Please allow patient to be transferred to [facility] specialty hospital for further care.  Expected Discharge Date:  01/29/18               Expected Discharge Plan:  Long Term Acute Care (LTAC)  In-House Referral:     Discharge planning Services  CM Consult  Post Acute Care Choice:    Choice offered to:  Adult Children  DME Arranged:    DME Agency:     HH Arranged:    HH Agency:     Status of Service:  Completed, signed off  If discussed at [place] of Stay Meetings, dates discussed:    Additional Comments:  Leeroy Cha, RN 03/04/2018, 8:21 AM

## 2018-03-04 NOTE — Progress Notes (Signed)
[facility] CONSULT NOTE   Pharmacy Consult for TPN Indication: ileus  Patient Measurements: Height: '5\' 3"'$  (160 cm) Weight: 112 lb 7 oz (51 kg) IBW/kg (Calculated) : 52.4 TPN AdjBW (KG): 58.3 Body mass index is 19.92 kg/m. Usual Weight: 53.8 kg (although as high as 71 kg earlier this stay, likely some fluid effect)  HPI: 69 yoF with ovarian cancer status post bilateral salpingo-oophorectomy and omentectomy with debulking on 8/22. Ex lap on 8/26 and transferred to ICU afterward. Remains critically ill on vent support, now s/p trach placement.Was previously on TPN 8/27-9/5, however has lately had issues tolerating enteral nutrition. TFs held since 9/17 d/t abdominal distention; CT abd shows persistent ileus. NG output remains high. [facility] resuming TPN per pharmacy on 9/19.  Central access: 02/01/18 TPN start date: 8/27-9/5, restart 9/19  Significant events:   8/27 Start TPN. Hold lipids for first 7 days while pt in ICU   8/30 scheduled Novolog insulin added q4h  9/2 CT scan of abdomenshowsundrained left pelvic fluid collection, IR consulted - defer drainage for now  9/4 Lipids started with TPN. Tube feeds (Vital AF 1.2 Cal) initiated at 38m/hr, order to increase by 161mhr every 8 hours to goal rate of 5056mr  9/5 TPN weaned off  9/19 resume TPN   Abd Xray 9/24 shows some improvement in ileus; passing gas & having BMs. Plan repeat 9/25 and consider clamping NGT  9/26: patient pulled out NG tube  ASSESSMENT                                                                                                          Insulin requirements: 12 units Novolog yesterday (no hx DM)  Current Nutrition: NPO, TF held  IVF: NaCl KVO  Today, 03/04/2018:  Glucose - (goal 100-150); CBGs well controlled on resistant SSI  Electrolytes - all fairly stable WNL; Mag requiring replacement recently; getting Lasix 20 mg IV daily  Renal - SCr low/stable; BUN  trending up slightly  LFTs - Alk phos trending up, borderline elevated, albumin low, otherwise stable WNL  TGs - WNL   Prealbumin - low, trending down since TPN restart  NUTRITIONAL GOALS                                                                                             RD recs: Kcal:  1750-1925 (30-33 kcal/kg) Protein:  82-93 grams (1.4-1.6 grams/kg)  PLAN  At 1800 today:  Continue TPN at 75 ml/hr (goal rate)  Current TPN provides 90 g of protein, 270 g of dextrose, and 54 g of lipids for a total of 1818 kcals meeting 100% of patient needs  Add MVI, trace elements, and famotidine 40 mg/day to TPN  Electrolytes in TPN: reducing Ca, K today; Cl:Ac 1:1  IVF per MD  Continue resistant SSI; will reduce to q6 hr CBG checks  TPN lab panels on Mondays & Thursdays; BMP, Mg, tomorrow AM  F/u daily  Reuel Boom, PharmD, BCPS [phone] 03/04/2018, 7:54 AM

## 2018-03-05 ENCOUNTER — Other Ambulatory Visit (HOSPITAL_COMMUNITY): Payer: Self-pay

## 2018-03-05 DIAGNOSIS — N179 Acute kidney failure, unspecified: Secondary | ICD-10-CM

## 2018-03-05 DIAGNOSIS — C482 Malignant neoplasm of peritoneum, unspecified: Secondary | ICD-10-CM

## 2018-03-05 DIAGNOSIS — R6520 Severe sepsis without septic shock: Secondary | ICD-10-CM

## 2018-03-05 DIAGNOSIS — J9621 Acute and chronic respiratory failure with hypoxia: Secondary | ICD-10-CM

## 2018-03-05 DIAGNOSIS — A419 Sepsis, unspecified organism: Secondary | ICD-10-CM

## 2018-03-05 DIAGNOSIS — J449 Chronic obstructive pulmonary disease, unspecified: Secondary | ICD-10-CM

## 2018-03-05 LAB — COMPREHENSIVE METABOLIC PANEL
ALT: 36 U/L (ref 0–44)
AST: 38 U/L (ref 15–41)
Albumin: 1.9 g/dL — ABNORMAL LOW (ref 3.5–5.0)
Alkaline Phosphatase: 230 U/L — ABNORMAL HIGH (ref 38–126)
Anion gap: 7 (ref 5–15)
BILIRUBIN TOTAL: 0.3 mg/dL (ref 0.3–1.2)
BUN: 18 mg/dL (ref 8–23)
CALCIUM: 8.6 mg/dL — AB (ref 8.9–10.3)
CO2: 26 mmol/L (ref 22–32)
Chloride: 97 mmol/L — ABNORMAL LOW (ref 98–111)
Creatinine, Ser: 0.42 mg/dL — ABNORMAL LOW (ref 0.44–1.00)
GFR calc Af Amer: >60 mL/min (ref >60)
Glucose, Bld: 147 mg/dL — ABNORMAL HIGH (ref 70–99)
POTASSIUM: 4.1 mmol/L (ref 3.5–5.1)
Sodium: 130 mmol/L — ABNORMAL LOW (ref 135–145)
TOTAL PROTEIN: 7.4 g/dL (ref 6.5–8.1)

## 2018-03-05 LAB — LIPID PANEL
CHOLESTEROL: 101 mg/dL (ref 0–200)
HDL: 16 mg/dL — ABNORMAL LOW (ref >40)
LDL CALC: 66 mg/dL (ref 0–99)
TRIGLYCERIDES: 97 mg/dL (ref <150)
Total CHOL/HDL Ratio: 6.3 RATIO
VLDL: 19 mg/dL (ref 0–40)

## 2018-03-05 LAB — URINE CULTURE: Culture: 50000 — AB

## 2018-03-05 LAB — PHOSPHORUS: PHOSPHORUS: 4 mg/dL (ref 2.5–4.6)

## 2018-03-05 LAB — CBC WITH DIFFERENTIAL/PLATELET
Abs Immature Granulocytes: 0.1 10*3/uL (ref 0.0–0.1)
BASOS ABS: 0 10*3/uL (ref 0.0–0.1)
BASOS PCT: 1 %
EOS ABS: 0.2 10*3/uL (ref 0.0–0.7)
EOS PCT: 3 %
HCT: 28.5 % — ABNORMAL LOW (ref 36.0–46.0)
HEMOGLOBIN: 9 g/dL — AB (ref 12.0–15.0)
Immature Granulocytes: 1 %
LYMPHS PCT: 30 %
Lymphs Abs: 1.5 10*3/uL (ref 0.7–4.0)
MCH: 24.3 pg — ABNORMAL LOW (ref 26.0–34.0)
MCHC: 31.6 g/dL (ref 30.0–36.0)
MCV: 76.8 fL — ABNORMAL LOW (ref 78.0–100.0)
Monocytes Absolute: 0.5 10*3/uL (ref 0.1–1.0)
Monocytes Relative: 10 %
Neutro Abs: 2.8 10*3/uL (ref 1.7–7.7)
Neutrophils Relative %: 55 %
PLATELETS: 251 10*3/uL (ref 150–400)
RBC: 3.71 MIL/uL — ABNORMAL LOW (ref 3.87–5.11)
RDW: 19.9 % — AB (ref 11.5–15.5)
WBC: 5.1 10*3/uL (ref 4.0–10.5)

## 2018-03-05 LAB — PROTIME-INR
INR: 1.24
PROTHROMBIN TIME: 15.5 s — AB (ref 11.4–15.2)

## 2018-03-05 LAB — MAGNESIUM: MAGNESIUM: 1.4 mg/dL — AB (ref 1.7–2.4)

## 2018-03-05 NOTE — Progress Notes (Signed)
Pulmonary Critical Care Medicine [place]   PULMONARY CRITICAL CARE SERVICE CONSULT NOTE  Date of Service: 03/05/2018  Jo Parker  OEV:[id number]  DOB: Jul 22, 1945   DOA: 03/04/2018  Referring Physician: Merton Border, MD  HPI: Jo Parker is a 72 y.o. female seen for follow up of Acute on Chronic Respiratory Failure.  Patient has multiple medical problems was admitted to the hospital with advanced ovarian carcinoma with peritoneal carcinomatosis.  Patient apparently has been undergoing neoadjuvant chemotherapy with carboplatin and Taxol which was completed in July.  She underwent debulking in August and then had exploratory laparotomy for gastric omentectomy bilateral salpingo-oophorectomy radical optimal tumor debulking and resection of the anterior abdominal wall.  Postoperatively she had multiple complications including failure to wean from the ventilator requiring a tracheostomy microperforation with an acute abdomen requiring another exploratory laparotomy.  Patient developed sepsis had to remain on the antibiotics for prolonged period of time.  Also she been having on and off encephalopathy.  The patient had also to be started on TPN because of malnutrition.  She had altered level of ental status had a CT scan which did not show any acute abnormalities.  The abdomen and pelvis CT were supposed to be done prior to removal of her drains.  Patient transferred to our facility on T collar.  Past medical history: Past Medical Hx:      Past Medical History:  Diagnosis Date  . Anxiety and depression   . Aortic aneurysm ([place])    see ov note 03/2017   . Arthritis   . Carcinomatosis ([place]) 10/2017  . COPD (chronic obstructive pulmonary disease) ([place]) 10/23/2017  . Depression   . Family history of breast cancer   . Family history of prostate cancer   . Family history of uterine cancer   . History of diverticulitis   . HLD (hyperlipidemia) 10/23/2017  .  Hypercholesterolemia   . Hypertension   . Mitral valve prolapse   . Sleep apnea    cpap machine mask and tubing in [place] per patient.  Patient does not have mask or tubing to bring on DOS of 01/27/2018.   Marland Kitchen Uterine cancer ([place])    had partial hysterectomy at 27   Past Surgical Hx:       Past Surgical History:  Procedure Laterality Date  . ABDOMINAL HYSTERECTOMY    . cardiac monitor implant    . CEREBRAL ANEURYSM REPAIR    . CERVICAL SPINE SURGERY     bone spurs  . IR FLUORO GUIDE PORT INSERTION RIGHT  10/14/2017  . IR PARACENTESIS  10/14/2017  . IR US GUIDE VASC ACCESS RIGHT  10/14/2017  . PARTIAL HYSTERECTOMY  1971   "precancer"   Allergy: No Known Allergies  Social Hx:  Social History        Socioeconomic History  . Marital status: Married    Spouse name: Not on file  . Number of children: 3  . Years of education: Not on file  . Highest education level: Not on file  Occupational History  . Occupation: retired  Scientific laboratory technician  . Financial resource strain: Not on file  . Food insecurity:    Worry: Not on file    Inability: Not on file  . Transportation needs:    Medical: Not on file    Non-medical: Not on file  Tobacco Use  . Smoking status: Current Some Day Smoker    Packs/day: 1.00    Years: 55.00    Pack  years: 55.00  . Smokeless tobacco: Never Used  Substance and Sexual Activity  . Alcohol use: Yes    Comment: 1 / week  . Drug use: Never  . Sexual activity: Not Currently  Lifestyle  . Physical activity:    Days per week: Not on file    Minutes per session: Not on file  . Stress: Not on file  Relationships  . Social connections:    Talks on phone: Not on file    Gets together: Not on file    Attends religious service: Not on file    Active member of club or organization: Not on file    Attends meetings of clubs or organizations: Not on file    Relationship status: Not on file  . Intimate  partner violence:    Fear of current or ex partner: Not on file    Emotionally abused: Not on file    Physically abused: Not on file    Forced sexual activity: Not on file  Other Topics Concern  . Not on file  Social History Narrative  . Not on file     Medications: Reviewed on Rounds  Physical Exam:  Vitals: Temperature 98.9 pulse 90 respiratory rate 20 blood pressure 140/84 saturations 100%  Ventilator Settings currently is off the ventilator on T collar room air  . General: Comfortable at this time . Eyes: Grossly normal lids, irises & conjunctiva . ENT: grossly tongue is normal . Neck: no obvious mass . Cardiovascular: S1 S2 normal no gallop . Respiratory: No rhonchi or rales are noted at this time. . Abdomen: soft . Skin: no rash seen on limited exam . Musculoskeletal: not rigid . Psychiatric:unable to assess . Neurologic: no seizure no involuntary movements         Lab Data:   Basic Metabolic Panel: Recent Labs  Lab 03/01/18 0240 03/02/18 0459 03/03/18 0546 03/04/18 0615 03/05/18 0704  NA 137 138 138 138 130*  K 3.4* 4.2 4.5 4.5 4.1  CL 100 104 107 104 97*  CO2 29 27 26 27 26   GLUCOSE 141* 185* 137* 109* 147*  BUN 14 18 24* 26* 18  CREATININE 0.38* 0.50 0.52 0.43* 0.42*  CALCIUM 8.3* 8.6* 8.4* 8.7* 8.6*  MG 1.9 1.8 2.0 1.8 1.4*  PHOS 3.1 3.7 4.5 4.1 4.0    ABG: Recent Labs  Lab 03/04/18 1820  PHART 7.454*  PCO2ART 40.0  PO2ART 73.7*  HCO3 27.6  O2SAT 95.2    Liver Function Tests: Recent Labs  Lab 02/28/18 0459 03/01/18 0240 03/02/18 0459 03/03/18 0546 03/05/18 0704  AST 21 22 22 25  38  ALT 14 14 13 17  36  ALKPHOS 94 114 119 130* 230*  BILITOT 0.2* 0.4 0.3 0.5 0.3  PROT 6.4* 6.9 6.7 6.9 7.4  ALBUMIN 1.7* 1.8* 1.9* 1.9* 1.9*   No results for input(s): LIPASE, AMYLASE in the last 168 hours. No results for input(s): AMMONIA in the last 168 hours.  CBC: Recent Labs  Lab 03/01/18 0240 03/02/18 0459 03/03/18 0546  03/04/18 0615 03/05/18 0704  WBC 9.9 9.0 8.5 6.1 5.1  NEUTROABS 7.3 6.6 5.0 3.5 2.8  HGB 9.6* 9.1* 8.3* 8.3* 9.0*  HCT 29.1* 27.7* 25.9* 26.0* 28.5*  MCV 76.6* 76.5* 77.5* 77.8* 76.8*  PLT 300 314 261 256 251    Cardiac Enzymes: No results for input(s): CKTOTAL, CKMB, CKMBINDEX, TROPONINI in the last 168 hours.  BNP (last 3 results) No results for input(s): BNP in the last 8760 hours.  ProBNP (last 3 results) No results for input(s): PROBNP in the last 8760 hours.  Radiological Exams: Dg Chest Port 1 View  Result Date: 03/05/2018 CLINICAL DATA:  Respiratory failure. History of hypertension and COPD. EXAM: PORTABLE CHEST 1 VIEW COMPARISON:  03/03/2018; 02/23/2018; 02/05/2018 FINDINGS: Evaluation of the left lung base is degraded secondary to the patient's overlying left hand. Grossly unchanged cardiac silhouette and mediastinal contours with atherosclerotic plaque within the thoracic aorta. Stable position of support apparatus. No pneumothorax. Lungs remain hyperexpanded with flattening the bilaterally diaphragms. Ill-defined heterogeneous airspace opacities with the peripheral aspect the right upper/mid lung are unchanged. Improved aeration of lungs with persistent thickening of the pulmonary interstitium. No new focal airspace opacities. No pleural effusion. No acute osseus abnormality. IMPRESSION: 1.  Stable positioning of support apparatus.  No pneumothorax. 2. Minimally improved aeration of lungs with persistent heterogeneous airspace opacities within the peripheral aspect of the right upper/mid lung worrisome for residual infection. Electronically Signed   By: Sandi Mariscal M.D.   On: 03/05/2018 09:22   Dg Abd Portable 1v  Result Date: 03/05/2018 CLINICAL DATA:  Ileus EXAM: PORTABLE ABDOMEN - 1 VIEW COMPARISON:  03/02/2018; 02/28/2018; 02/22/2018; CT abdomen and pelvis - 02/23/2018 FINDINGS: Surgical drain overlies the left lower abdomen. Redemonstrated moderate gas distention of  several loops of mildly dilated patulous appearing small bowel with index loop within the right lower quadrant measuring approximately 3 cm in diameter. Air is seen within the colon. No supine evidence of pneumoperitoneum. No pneumatosis or portal venous gas. Atherosclerotic plaque within the abdominal aorta. No definitive abnormal intra-abdominal calcifications. Interval removal of enteric tube. IMPRESSION: No change to slight improvement in suspected postoperative ileus. No evidence of enteric obstruction. Electronically Signed   By: Sandi Mariscal M.D.   On: 03/05/2018 09:20    Assessment/Plan Principal Problem:   Acute on chronic respiratory failure with hypoxia ([name]) Active Problems:   Primary peritoneal carcinomatosis ([name])   Severe sepsis ([place])   COPD (chronic obstructive pulmonary disease) ([place])   AKI (acute kidney injury) ([place])   1. Acute on chronic respiratory failure with hypoxia patient currently is actually doing very well she is not requiring any supplemental oxygen.  We will continue with T collar respiratory therapy will assess for PMV and capping.  Continue aggressive pulmonary toilet supportive care 2. Primary peritoneal carcinomatosis she will need follow-up with oncology once we are able to decannulate her. 3. Sepsis ARDS pneumonia patient is been treated with antibiotics we will continue with supportive care.  Follow-up on the chest films as needed. 4. COPD severe disease we will continue with present management monitor oxygen right now she is currently off of oxygen 5. Acute renal failure we will continue to monitor labs follow fluid status closely.   I have personally seen and evaluated the patient, evaluated laboratory and imaging results, formulated the assessment and plan and placed orders. The Patient requires high complexity decision making for assessment and support.  Case was discussed on Rounds with the Respiratory Therapy Staff time spent 70 minutes  Allyne Gee, MD [facility] Pulmonary Critical Care Medicine Sleep Medicine

## 2018-03-06 NOTE — Progress Notes (Signed)
Pulmonary Critical Care Medicine [place]   PULMONARY CRITICAL CARE SERVICE  PROGRESS NOTE  Date of Service: 03/06/2018  Kamera Dubas  GUY:[id number]  DOB: November 14, 1945   DOA: 03/04/2018  Referring Physician: Merton Border, MD  HPI: Jo Parker is a 72 y.o. female seen for follow up of Acute on Chronic Respiratory Failure.  Patient is on T collar she is doing very well.  Family was present in the room.  Apparently she has no other surgical interventions planned.  She is actually had minimal to no secretions.  I think we should be able to proceed to capping  Medications: Reviewed on Rounds  Physical Exam:  Vitals: Temperature 98.3 pulse 82 respiratory rate 18 blood pressure 143/72 saturations 99%  Ventilator Settings off the ventilator on T collar  . General: Comfortable at this time . Eyes: Grossly normal lids, irises & conjunctiva . ENT: grossly tongue is normal . Neck: no obvious mass . Cardiovascular: S1 S2 normal no gallop . Respiratory: No rhonchi no rales are noted. . Abdomen: soft . Skin: no rash seen on limited exam . Musculoskeletal: not rigid . Psychiatric:unable to assess . Neurologic: no seizure no involuntary movements         Lab Data:   Basic Metabolic Panel: Recent Labs  Lab 03/01/18 0240 03/02/18 0459 03/03/18 0546 03/04/18 0615 03/05/18 0704  NA 137 138 138 138 130*  K 3.4* 4.2 4.5 4.5 4.1  CL 100 104 107 104 97*  CO2 29 27 26 27 26   GLUCOSE 141* 185* 137* 109* 147*  BUN 14 18 24* 26* 18  CREATININE 0.38* 0.50 0.52 0.43* 0.42*  CALCIUM 8.3* 8.6* 8.4* 8.7* 8.6*  MG 1.9 1.8 2.0 1.8 1.4*  PHOS 3.1 3.7 4.5 4.1 4.0    ABG: Recent Labs  Lab 03/04/18 1820  PHART 7.454*  PCO2ART 40.0  PO2ART 73.7*  HCO3 27.6  O2SAT 95.2    Liver Function Tests: Recent Labs  Lab 02/28/18 0459 03/01/18 0240 03/02/18 0459 03/03/18 0546 03/05/18 0704  AST 21 22 22 25  38  ALT 14 14 13 17  36  ALKPHOS 94 114 119 130* 230*   BILITOT 0.2* 0.4 0.3 0.5 0.3  PROT 6.4* 6.9 6.7 6.9 7.4  ALBUMIN 1.7* 1.8* 1.9* 1.9* 1.9*   No results for input(s): LIPASE, AMYLASE in the last 168 hours. No results for input(s): AMMONIA in the last 168 hours.  CBC: Recent Labs  Lab 03/01/18 0240 03/02/18 0459 03/03/18 0546 03/04/18 0615 03/05/18 0704  WBC 9.9 9.0 8.5 6.1 5.1  NEUTROABS 7.3 6.6 5.0 3.5 2.8  HGB 9.6* 9.1* 8.3* 8.3* 9.0*  HCT 29.1* 27.7* 25.9* 26.0* 28.5*  MCV 76.6* 76.5* 77.5* 77.8* 76.8*  PLT 300 314 261 256 251    Cardiac Enzymes: No results for input(s): CKTOTAL, CKMB, CKMBINDEX, TROPONINI in the last 168 hours.  BNP (last 3 results) No results for input(s): BNP in the last 8760 hours.  ProBNP (last 3 results) No results for input(s): PROBNP in the last 8760 hours.  Radiological Exams: Dg Chest Port 1 View  Result Date: 03/05/2018 CLINICAL DATA:  Respiratory failure. History of hypertension and COPD. EXAM: PORTABLE CHEST 1 VIEW COMPARISON:  03/03/2018; 02/23/2018; 02/05/2018 FINDINGS: Evaluation of the left lung base is degraded secondary to the patient's overlying left hand. Grossly unchanged cardiac silhouette and mediastinal contours with atherosclerotic plaque within the thoracic aorta. Stable position of support apparatus. No pneumothorax. Lungs remain hyperexpanded with flattening the bilaterally diaphragms. Ill-defined heterogeneous airspace  opacities with the peripheral aspect the right upper/mid lung are unchanged. Improved aeration of lungs with persistent thickening of the pulmonary interstitium. No new focal airspace opacities. No pleural effusion. No acute osseus abnormality. IMPRESSION: 1.  Stable positioning of support apparatus.  No pneumothorax. 2. Minimally improved aeration of lungs with persistent heterogeneous airspace opacities within the peripheral aspect of the right upper/mid lung worrisome for residual infection. Electronically Signed   By: Sandi Mariscal M.D.   On: 03/05/2018 09:22    Dg Abd Portable 1v  Result Date: 03/05/2018 CLINICAL DATA:  Ileus EXAM: PORTABLE ABDOMEN - 1 VIEW COMPARISON:  03/02/2018; 02/28/2018; 02/22/2018; CT abdomen and pelvis - 02/23/2018 FINDINGS: Surgical drain overlies the left lower abdomen. Redemonstrated moderate gas distention of several loops of mildly dilated patulous appearing small bowel with index loop within the right lower quadrant measuring approximately 3 cm in diameter. Air is seen within the colon. No supine evidence of pneumoperitoneum. No pneumatosis or portal venous gas. Atherosclerotic plaque within the abdominal aorta. No definitive abnormal intra-abdominal calcifications. Interval removal of enteric tube. IMPRESSION: No change to slight improvement in suspected postoperative ileus. No evidence of enteric obstruction. Electronically Signed   By: Sandi Mariscal M.D.   On: 03/05/2018 09:20    Assessment/Plan Principal Problem:   Acute on chronic respiratory failure with hypoxia ([name]) Active Problems:   Primary peritoneal carcinomatosis ([name])   Severe sepsis ([place])   COPD (chronic obstructive pulmonary disease) ([place])   AKI (acute kidney injury) ([place])   1. Acute on chronic respiratory failure with hypoxia we will advance her wean and start capping trials.  If she is able to tolerate capping I think we should be able to proceed to decannulation rather quickly.  We will continue with supportive care no oxygen necessary at this time she has excellent saturations 2. Primary peritoneal carcinomatosis.  As noted previously patient will follow-up with the primary oncologist.  She is supposed to have a CT scan prior to removal of the drains will continue supportive care 3. Severe sepsis clinically improved continue with present therapy. 4. COPD severe disease continue with supportive care oxygen is not necessary right now 5. Acute renal failure follow-up on labs seems to be stable   I have personally seen and evaluated the patient,  evaluated laboratory and imaging results, formulated the assessment and plan and placed orders. The Patient requires high complexity decision making for assessment and support.  Case was discussed on Rounds with the Respiratory Therapy Staff  Allyne Gee, MD [facility] Pulmonary Critical Care Medicine Sleep Medicine

## 2018-03-07 ENCOUNTER — Encounter: Payer: Self-pay | Admitting: Internal Medicine

## 2018-03-07 ENCOUNTER — Telehealth: Payer: Self-pay | Admitting: Oncology

## 2018-03-07 DIAGNOSIS — J9621 Acute and chronic respiratory failure with hypoxia: Secondary | ICD-10-CM

## 2018-03-07 HISTORY — DX: Acute and chronic respiratory failure with hypoxia: J96.21

## 2018-03-07 LAB — BASIC METABOLIC PANEL
Anion gap: 7 (ref 5–15)
BUN: 23 mg/dL (ref 8–23)
CO2: 27 mmol/L (ref 22–32)
Calcium: 8.9 mg/dL (ref 8.9–10.3)
Chloride: 97 mmol/L — ABNORMAL LOW (ref 98–111)
Creatinine, Ser: 0.47 mg/dL (ref 0.44–1.00)
GFR calc Af Amer: >60 mL/min (ref >60)
GLUCOSE: 116 mg/dL — AB (ref 70–99)
POTASSIUM: 4.1 mmol/L (ref 3.5–5.1)
Sodium: 131 mmol/L — ABNORMAL LOW (ref 135–145)

## 2018-03-07 NOTE — Telephone Encounter (Signed)
[facility] and asked if CT abd/pelvis with be ordered for 03/08/18 or 03/09/18 to evaluate for JP drain removal.  Also advised that Dr. Skeet Latch would like to see the patient on Thursday, 03/10/18 morning and view the CT results.

## 2018-03-07 NOTE — Progress Notes (Signed)
Pulmonary Critical Care Medicine [place]   PULMONARY CRITICAL CARE SERVICE  PROGRESS NOTE  Date of Service: 03/07/2018  Jo Parker  PYP:[id number]  DOB: [birth year]   DOA: 03/04/2018  Referring Physician: Merton Border, MD  HPI: Jo Parker is a 72 y.o. female seen for follow up of Acute on Chronic Respiratory Failure.  She is doing well capping at this time has been on room air she is been capping since yesterday no issues overnight secretions are minimal  Medications: Reviewed on Rounds  Physical Exam:  Vitals: Temperature 98.6 pulse 90 respiratory rate 18 blood pressure 104/57 saturations are 100%  Ventilator Settings currently capping continue with supportive care  . General: Comfortable at this time . Eyes: Grossly normal lids, irises & conjunctiva . ENT: grossly tongue is normal . Neck: no obvious mass . Cardiovascular: S1 S2 normal no gallop . Respiratory: No rhonchi or rales are noted . Abdomen: soft . Skin: no rash seen on limited exam . Musculoskeletal: not rigid . Psychiatric:unable to assess . Neurologic: no seizure no involuntary movements         Lab Data:   Basic Metabolic Panel: Recent Labs  Lab 03/01/18 0240 03/02/18 0459 03/03/18 0546 03/04/18 0615 03/05/18 0704 03/07/18 0648  NA 137 138 138 138 130* 131*  K 3.4* 4.2 4.5 4.5 4.1 4.1  CL 100 104 107 104 97* 97*  CO2 29 27 26 27 26 27   GLUCOSE 141* 185* 137* 109* 147* 116*  BUN 14 18 24* 26* 18 23  CREATININE 0.38* 0.50 0.52 0.43* 0.42* 0.47  CALCIUM 8.3* 8.6* 8.4* 8.7* 8.6* 8.9  MG 1.9 1.8 2.0 1.8 1.4*  --   PHOS 3.1 3.7 4.5 4.1 4.0  --     ABG: Recent Labs  Lab 03/04/18 1820  PHART 7.454*  PCO2ART 40.0  PO2ART 73.7*  HCO3 27.6  O2SAT 95.2    Liver Function Tests: Recent Labs  Lab 03/01/18 0240 03/02/18 0459 03/03/18 0546 03/05/18 0704  AST 22 22 25  38  ALT 14 13 17  36  ALKPHOS 114 119 130* 230*  BILITOT 0.4 0.3 0.5 0.3  PROT 6.9 6.7 6.9 7.4   ALBUMIN 1.8* 1.9* 1.9* 1.9*   No results for input(s): LIPASE, AMYLASE in the last 168 hours. No results for input(s): AMMONIA in the last 168 hours.  CBC: Recent Labs  Lab 03/01/18 0240 03/02/18 0459 03/03/18 0546 03/04/18 0615 03/05/18 0704  WBC 9.9 9.0 8.5 6.1 5.1  NEUTROABS 7.3 6.6 5.0 3.5 2.8  HGB 9.6* 9.1* 8.3* 8.3* 9.0*  HCT 29.1* 27.7* 25.9* 26.0* 28.5*  MCV 76.6* 76.5* 77.5* 77.8* 76.8*  PLT 300 314 261 256 251    Cardiac Enzymes: No results for input(s): CKTOTAL, CKMB, CKMBINDEX, TROPONINI in the last 168 hours.  BNP (last 3 results) No results for input(s): BNP in the last 8760 hours.  ProBNP (last 3 results) No results for input(s): PROBNP in the last 8760 hours.  Radiological Exams: No results found.  Assessment/Plan Principal Problem:   Acute on chronic respiratory failure with hypoxia ([name]) Active Problems:   Primary peritoneal carcinomatosis ([name])   Severe sepsis ([name])   COPD (chronic obstructive pulmonary disease) ([place])   AKI (acute kidney injury) ([place])   1. Acute on chronic respiratory failure with hypoxia doing well we should be able to hopefully decannulate soon continue with supportive care oxygen as needed. 2. Primary peritoneal carcinomatosis she is stable at this time we will continue with supportive care. 3.  Severe sepsis hemodynamically stable treated with antibiotics we will continue present therapy 4. Severe COPD at baseline 5. Acute renal failure resolved     I have personally seen and evaluated the patient, evaluated laboratory and imaging results, formulated the assessment and plan and placed orders. The Patient requires high complexity decision making for assessment and support.  Case was discussed on Rounds with the Respiratory Therapy Staff  Allyne Gee, MD [facility] Pulmonary Critical Care Medicine Sleep Medicine

## 2018-03-08 ENCOUNTER — Other Ambulatory Visit (HOSPITAL_COMMUNITY): Payer: Self-pay

## 2018-03-08 ENCOUNTER — Telehealth: Payer: Self-pay | Admitting: Oncology

## 2018-03-08 LAB — CULTURE, BLOOD (ROUTINE X 2)
CULTURE: NO GROWTH
CULTURE: NO GROWTH
Special Requests: ADEQUATE

## 2018-03-08 MED ORDER — IOPAMIDOL (ISOVUE-300) INJECTION 61%
75.0000 mL | Freq: Once | INTRAVENOUS | Status: AC
  Administered 2018-03-08: 75 mL via INTRAVENOUS

## 2018-03-08 NOTE — Progress Notes (Signed)
Pulmonary Critical Care Medicine [place]   PULMONARY CRITICAL CARE SERVICE  PROGRESS NOTE  Date of Service: 03/08/2018  Jo Parker  OZH:[id number]  DOB: [birth year]   DOA: 03/04/2018  Referring Physician: Merton Border, MD  HPI: Jo Parker is a 72 y.o. female seen for follow up of Acute on Chronic Respiratory Failure.  Patient is capping doing fairly well.  We should be able to proceed to decannulation  Medications: Reviewed on Rounds  Physical Exam:  Vitals: Temperature 97.7 pulse 96 respiratory rate 16 blood pressure 145/69 saturation 97%  Ventilator Settings continue with capping to decannulation today  . General: Comfortable at this time . Eyes: Grossly normal lids, irises & conjunctiva . ENT: grossly tongue is normal . Neck: no obvious mass . Cardiovascular: S1 S2 normal no gallop . Respiratory: No rhonchi or rales are noted . Abdomen: soft . Skin: no rash seen on limited exam . Musculoskeletal: not rigid . Psychiatric:unable to assess . Neurologic: no seizure no involuntary movements         Lab Data:   Basic Metabolic Panel: Recent Labs  Lab 03/02/18 0459 03/03/18 0546 03/04/18 0615 03/05/18 0704 03/07/18 0648  NA 138 138 138 130* 131*  K 4.2 4.5 4.5 4.1 4.1  CL 104 107 104 97* 97*  CO2 27 26 27 26 27   GLUCOSE 185* 137* 109* 147* 116*  BUN 18 24* 26* 18 23  CREATININE 0.50 0.52 0.43* 0.42* 0.47  CALCIUM 8.6* 8.4* 8.7* 8.6* 8.9  MG 1.8 2.0 1.8 1.4*  --   PHOS 3.7 4.5 4.1 4.0  --     ABG: Recent Labs  Lab 03/04/18 1820  PHART 7.454*  PCO2ART 40.0  PO2ART 73.7*  HCO3 27.6  O2SAT 95.2    Liver Function Tests: Recent Labs  Lab 03/02/18 0459 03/03/18 0546 03/05/18 0704  AST 22 25 38  ALT 13 17 36  ALKPHOS 119 130* 230*  BILITOT 0.3 0.5 0.3  PROT 6.7 6.9 7.4  ALBUMIN 1.9* 1.9* 1.9*   No results for input(s): LIPASE, AMYLASE in the last 168 hours. No results for input(s): AMMONIA in the last 168  hours.  CBC: Recent Labs  Lab 03/02/18 0459 03/03/18 0546 03/04/18 0615 03/05/18 0704  WBC 9.0 8.5 6.1 5.1  NEUTROABS 6.6 5.0 3.5 2.8  HGB 9.1* 8.3* 8.3* 9.0*  HCT 27.7* 25.9* 26.0* 28.5*  MCV 76.5* 77.5* 77.8* 76.8*  PLT 314 261 256 251    Cardiac Enzymes: No results for input(s): CKTOTAL, CKMB, CKMBINDEX, TROPONINI in the last 168 hours.  BNP (last 3 results) No results for input(s): BNP in the last 8760 hours.  ProBNP (last 3 results) No results for input(s): PROBNP in the last 8760 hours.  Radiological Exams: Ct Abdomen Pelvis W Contrast  Result Date: 03/08/2018 CLINICAL DATA:  Status post drainage of abdominal abscess. EXAM: CT ABDOMEN AND PELVIS WITH CONTRAST TECHNIQUE: Multidetector CT imaging of the abdomen and pelvis was performed using the standard protocol following bolus administration of intravenous contrast. CONTRAST:  75 mL ISOVUE-300 IOPAMIDOL (ISOVUE-300) INJECTION 61% COMPARISON:  CT scan of February 23, 2018. FINDINGS: Lower chest: Minimal right posterior basilar subsegmental atelectasis is noted. Hepatobiliary: No focal liver abnormality is seen. No gallstones, gallbladder wall thickening, or biliary dilatation. Pancreas: Unremarkable. No pancreatic ductal dilatation or surrounding inflammatory changes. Spleen: Normal in size without focal abnormality. Adrenals/Urinary Tract: Adrenal glands appear normal. No hydronephrosis or renal obstruction is noted. Left renal cyst is noted. Urinary bladder is unremarkable.  Stomach/Bowel: The stomach appears normal. There is no evidence of bowel obstruction or inflammation. Rectal tube is noted. Sigmoid diverticulosis is noted without inflammation. The appendix is not visualized. Vascular/Lymphatic: 3.9 cm aortic aneurysm is noted at aortic hiatus. Atherosclerosis of abdominal aorta is noted. No adenopathy is noted. Reproductive: Status post hysterectomy. No adnexal masses. Other: 7.0 x 1.3 cm left pelvic fluid collection is  noted with air-fluid level and enhancing margins, most consistent with abscess. This is slightly smaller compared to prior exam. Stable position of surgical drainage catheter seen in the pelvis. Musculoskeletal: No acute or significant osseous findings. IMPRESSION: 7.0 x 1.3 cm left pelvic fluid collection is noted with air-fluid level and enhancing margins, most consistent with abscess. It is slightly smaller compared to prior exam. Stable position of surgical drainage catheter is seen in the pelvis. Sigmoid diverticulosis is noted without inflammation. 3.9 cm aortic aneurysm is noted at aortic hiatus. Recommend followup by [name] in 2 years. This recommendation follows ACR consensus guidelines: White Paper of the ACR Incidental Findings Committee II on Vascular Findings. J Am Coll Radiol 2013; 10:789-794. Electronically Signed   By: Marijo Conception, M.D.   On: 03/08/2018 13:17    Assessment/Plan Principal Problem:   Acute on chronic respiratory failure with hypoxia ([name]) Active Problems:   Primary peritoneal carcinomatosis ([name])   Severe sepsis ([place])   COPD (chronic obstructive pulmonary disease) ([place])   AKI (acute kidney injury) ([place])   1. Acute on chronic respiratory failure with hypoxia we will continue with supportive care decannulate today 2. Primary peritoneal carcinomatosis prognosis guarded follow-up with oncology 3. Severe sepsis at baseline we will continue present therapy 4. COPD advanced disease 5. Acute renal failure follow-up labs   I have personally seen and evaluated the patient, evaluated laboratory and imaging results, formulated the assessment and plan and placed orders. The Patient requires high complexity decision making for assessment and support.  Case was discussed on Rounds with the Respiratory Therapy Staff  Allyne Gee, MD [facility] Pulmonary Critical Care Medicine Sleep Medicine

## 2018-03-08 NOTE — Telephone Encounter (Signed)
Called patient's son, [name] to check on patient's status.  He said she is doing well and stood unassisted yesterday for 4 minutes.  He also said she is talking a lot and asking for her cell phone.  She will have a swallow eval tomorrow.  Advised him to call if they have any questions.

## 2018-03-09 ENCOUNTER — Other Ambulatory Visit (HOSPITAL_COMMUNITY): Payer: Self-pay

## 2018-03-09 LAB — BASIC METABOLIC PANEL
Anion gap: 11 (ref 5–15)
BUN: 21 mg/dL (ref 8–23)
CO2: 25 mmol/L (ref 22–32)
CREATININE: 0.5 mg/dL (ref 0.44–1.00)
Calcium: 9 mg/dL (ref 8.9–10.3)
Chloride: 95 mmol/L — ABNORMAL LOW (ref 98–111)
GFR calc Af Amer: >60 mL/min (ref >60)
GLUCOSE: 146 mg/dL — AB (ref 70–99)
Potassium: 4.1 mmol/L (ref 3.5–5.1)
SODIUM: 131 mmol/L — AB (ref 135–145)

## 2018-03-09 LAB — CBC
HCT: 31 % — ABNORMAL LOW (ref 36.0–46.0)
Hemoglobin: 9.7 g/dL — ABNORMAL LOW (ref 12.0–15.0)
MCH: 24.7 pg — AB (ref 26.0–34.0)
MCHC: 31.3 g/dL (ref 30.0–36.0)
MCV: 79.1 fL (ref 78.0–100.0)
PLATELETS: 279 10*3/uL (ref 150–400)
RBC: 3.92 MIL/uL (ref 3.87–5.11)
RDW: 19.6 % — AB (ref 11.5–15.5)
WBC: 9.6 10*3/uL (ref 4.0–10.5)

## 2018-03-09 LAB — MAGNESIUM: MAGNESIUM: 1.4 mg/dL — AB (ref 1.7–2.4)

## 2018-03-09 LAB — PHOSPHORUS: Phosphorus: 4.3 mg/dL (ref 2.5–4.6)

## 2018-03-09 NOTE — Progress Notes (Signed)
Pulmonary Critical Care Medicine [place]   PULMONARY CRITICAL CARE SERVICE  PROGRESS NOTE  Date of Service: 03/09/2018  Jo Parker  HBZ:[id number]  DOB: 12-Aug-1945   DOA: 03/04/2018  Referring Physician: Merton Border, MD  HPI: Jo Parker is a 72 y.o. female seen for follow up of Acute on Chronic Respiratory Failure.  Patient is decannulated she looks good comfortable no distress  Medications: Reviewed on Rounds  Physical Exam:  Vitals: Temperature 97.6 pulse 114 respiratory rate 21 blood pressure 131/56 saturation 96%  Ventilator Settings off the ventilator decannulated successfully  . General: Comfortable at this time . Eyes: Grossly normal lids, irises & conjunctiva . ENT: grossly tongue is normal . Neck: no obvious mass . Cardiovascular: S1 S2 normal no gallop . Respiratory: No rhonchi or rales are noted . Abdomen: soft . Skin: no rash seen on limited exam . Musculoskeletal: not rigid . Psychiatric:unable to assess . Neurologic: no seizure no involuntary movements         Lab Data:   Basic Metabolic Panel: Recent Labs  Lab 03/03/18 0546 03/04/18 0615 03/05/18 0704 03/07/18 0648 03/09/18 0515  NA 138 138 130* 131* 131*  K 4.5 4.5 4.1 4.1 4.1  CL 107 104 97* 97* 95*  CO2 26 27 26 27 25   GLUCOSE 137* 109* 147* 116* 146*  BUN 24* 26* 18 23 21   CREATININE 0.52 0.43* 0.42* 0.47 0.50  CALCIUM 8.4* 8.7* 8.6* 8.9 9.0  MG 2.0 1.8 1.4*  --  1.4*  PHOS 4.5 4.1 4.0  --  4.3    ABG: Recent Labs  Lab 03/04/18 1820  PHART 7.454*  PCO2ART 40.0  PO2ART 73.7*  HCO3 27.6  O2SAT 95.2    Liver Function Tests: Recent Labs  Lab 03/03/18 0546 03/05/18 0704  AST 25 38  ALT 17 36  ALKPHOS 130* 230*  BILITOT 0.5 0.3  PROT 6.9 7.4  ALBUMIN 1.9* 1.9*   No results for input(s): LIPASE, AMYLASE in the last 168 hours. No results for input(s): AMMONIA in the last 168 hours.  CBC: Recent Labs  Lab 03/03/18 0546 03/04/18 0615  03/05/18 0704 03/09/18 0515  WBC 8.5 6.1 5.1 9.6  NEUTROABS 5.0 3.5 2.8  --   HGB 8.3* 8.3* 9.0* 9.7*  HCT 25.9* 26.0* 28.5* 31.0*  MCV 77.5* 77.8* 76.8* 79.1  PLT 261 256 251 279    Cardiac Enzymes: No results for input(s): CKTOTAL, CKMB, CKMBINDEX, TROPONINI in the last 168 hours.  BNP (last 3 results) No results for input(s): BNP in the last 8760 hours.  ProBNP (last 3 results) No results for input(s): PROBNP in the last 8760 hours.  Radiological Exams: Ct Abdomen Pelvis W Contrast  Result Date: 03/08/2018 CLINICAL DATA:  Status post drainage of abdominal abscess. EXAM: CT ABDOMEN AND PELVIS WITH CONTRAST TECHNIQUE: Multidetector CT imaging of the abdomen and pelvis was performed using the standard protocol following bolus administration of intravenous contrast. CONTRAST:  75 mL ISOVUE-300 IOPAMIDOL (ISOVUE-300) INJECTION 61% COMPARISON:  CT scan of February 23, 2018. FINDINGS: Lower chest: Minimal right posterior basilar subsegmental atelectasis is noted. Hepatobiliary: No focal liver abnormality is seen. No gallstones, gallbladder wall thickening, or biliary dilatation. Pancreas: Unremarkable. No pancreatic ductal dilatation or surrounding inflammatory changes. Spleen: Normal in size without focal abnormality. Adrenals/Urinary Tract: Adrenal glands appear normal. No hydronephrosis or renal obstruction is noted. Left renal cyst is noted. Urinary bladder is unremarkable. Stomach/Bowel: The stomach appears normal. There is no evidence of bowel obstruction or  inflammation. Rectal tube is noted. Sigmoid diverticulosis is noted without inflammation. The appendix is not visualized. Vascular/Lymphatic: 3.9 cm aortic aneurysm is noted at aortic hiatus. Atherosclerosis of abdominal aorta is noted. No adenopathy is noted. Reproductive: Status post hysterectomy. No adnexal masses. Other: 7.0 x 1.3 cm left pelvic fluid collection is noted with air-fluid level and enhancing margins, most consistent  with abscess. This is slightly smaller compared to prior exam. Stable position of surgical drainage catheter seen in the pelvis. Musculoskeletal: No acute or significant osseous findings. IMPRESSION: 7.0 x 1.3 cm left pelvic fluid collection is noted with air-fluid level and enhancing margins, most consistent with abscess. It is slightly smaller compared to prior exam. Stable position of surgical drainage catheter is seen in the pelvis. Sigmoid diverticulosis is noted without inflammation. 3.9 cm aortic aneurysm is noted at aortic hiatus. Recommend followup by [name] in 2 years. This recommendation follows ACR consensus guidelines: White Paper of the ACR Incidental Findings Committee II on Vascular Findings. J Am Coll Radiol 2013; 10:789-794. Electronically Signed   By: Marijo Conception, M.D.   On: 03/08/2018 13:17    Assessment/Plan Principal Problem:   Acute on chronic respiratory failure with hypoxia ([name]) Active Problems:   Primary peritoneal carcinomatosis ([name])   Severe sepsis ([place])   COPD (chronic obstructive pulmonary disease) ([place])   AKI (acute kidney injury) ([place])   1. Acute on chronic respiratory failure with hypoxia patient is going to continue with supportive care oxygen as necessary right now is on room air 2. Primary peritoneal carcinomatosis continue with supportive care 3. Severe sepsis resolved 4. COPD stable 5. Acute renal failure resolved   I have personally seen and evaluated the patient, evaluated laboratory and imaging results, formulated the assessment and plan and placed orders. The Patient requires high complexity decision making for assessment and support.  Case was discussed on Rounds with the Respiratory Therapy Staff  Allyne Gee, MD [facility] Pulmonary Critical Care Medicine Sleep Medicine

## 2018-03-10 ENCOUNTER — Telehealth: Payer: Self-pay | Admitting: Oncology

## 2018-03-10 LAB — BASIC METABOLIC PANEL
Anion gap: 10 (ref 5–15)
BUN: 24 mg/dL — ABNORMAL HIGH (ref 8–23)
CALCIUM: 8.8 mg/dL — AB (ref 8.9–10.3)
CO2: 26 mmol/L (ref 22–32)
CREATININE: 0.61 mg/dL (ref 0.44–1.00)
Chloride: 94 mmol/L — ABNORMAL LOW (ref 98–111)
Glucose, Bld: 129 mg/dL — ABNORMAL HIGH (ref 70–99)
Potassium: 3.6 mmol/L (ref 3.5–5.1)
Sodium: 130 mmol/L — ABNORMAL LOW (ref 135–145)

## 2018-03-10 LAB — MAGNESIUM: MAGNESIUM: 1.9 mg/dL (ref 1.7–2.4)

## 2018-03-10 NOTE — Telephone Encounter (Signed)
Called Jo Parker and advised him that [name], NP and Dr. Skeet Latch are going to visit his mother today at 8:30 am.  He verbalized understanding and agreement.

## 2018-03-11 ENCOUNTER — Telehealth: Payer: Self-pay | Admitting: Oncology

## 2018-03-11 LAB — COMPREHENSIVE METABOLIC PANEL
ALT: 66 U/L — ABNORMAL HIGH (ref 0–44)
AST: 47 U/L — ABNORMAL HIGH (ref 15–41)
Albumin: 1.9 g/dL — ABNORMAL LOW (ref 3.5–5.0)
Alkaline Phosphatase: 193 U/L — ABNORMAL HIGH (ref 38–126)
Anion gap: 7 (ref 5–15)
BUN: 26 mg/dL — ABNORMAL HIGH (ref 8–23)
CO2: 26 mmol/L (ref 22–32)
Calcium: 8.7 mg/dL — ABNORMAL LOW (ref 8.9–10.3)
Chloride: 97 mmol/L — ABNORMAL LOW (ref 98–111)
Creatinine, Ser: 0.56 mg/dL (ref 0.44–1.00)
GFR calc Af Amer: >60 mL/min (ref >60)
GFR calc non Af Amer: >60 mL/min (ref >60)
Glucose, Bld: 138 mg/dL — ABNORMAL HIGH (ref 70–99)
Potassium: 3.8 mmol/L (ref 3.5–5.1)
Sodium: 130 mmol/L — ABNORMAL LOW (ref 135–145)
Total Bilirubin: 0.3 mg/dL (ref 0.3–1.2)
Total Protein: 7.6 g/dL (ref 6.5–8.1)

## 2018-03-11 LAB — CBC
HEMATOCRIT: 26 % — AB (ref 36.0–46.0)
HEMOGLOBIN: 8.1 g/dL — AB (ref 12.0–15.0)
MCH: 24 pg — AB (ref 26.0–34.0)
MCHC: 31.2 g/dL (ref 30.0–36.0)
MCV: 77.2 fL — ABNORMAL LOW (ref 78.0–100.0)
Platelets: 245 10*3/uL (ref 150–400)
RBC: 3.37 MIL/uL — ABNORMAL LOW (ref 3.87–5.11)
RDW: 19 % — AB (ref 11.5–15.5)
WBC: 7.2 10*3/uL (ref 4.0–10.5)

## 2018-03-11 LAB — LIPID PANEL
CHOLESTEROL: 109 mg/dL (ref 0–200)
HDL: 17 mg/dL — AB (ref >40)
LDL Cholesterol: 81 mg/dL (ref 0–99)
TRIGLYCERIDES: 54 mg/dL (ref <150)
Total CHOL/HDL Ratio: 6.4 RATIO
VLDL: 11 mg/dL (ref 0–40)

## 2018-03-11 NOTE — Telephone Encounter (Signed)
Called Jo Parker and advised him that Dr. Skeet Latch has decided not to order IR evaluation for his mother's JP drain.  She said since it is draining now, she does not need an IR evaluation and would like to keep the JP drain in place.  He verbalized understanding and agreement.

## 2018-03-12 ENCOUNTER — Other Ambulatory Visit (HOSPITAL_COMMUNITY): Payer: Self-pay

## 2018-03-12 LAB — URINALYSIS, ROUTINE W REFLEX MICROSCOPIC
BILIRUBIN URINE: NEGATIVE
Glucose, UA: NEGATIVE mg/dL
HGB URINE DIPSTICK: NEGATIVE
Ketones, ur: NEGATIVE mg/dL
Leukocytes, UA: NEGATIVE
Nitrite: NEGATIVE
PROTEIN: NEGATIVE mg/dL
Specific Gravity, Urine: 1.014 (ref 1.005–1.030)
pH: 6 (ref 5.0–8.0)

## 2018-03-12 LAB — BASIC METABOLIC PANEL
ANION GAP: 7 (ref 5–15)
BUN: 20 mg/dL (ref 8–23)
CALCIUM: 8.6 mg/dL — AB (ref 8.9–10.3)
CO2: 25 mmol/L (ref 22–32)
Chloride: 97 mmol/L — ABNORMAL LOW (ref 98–111)
Creatinine, Ser: 0.54 mg/dL (ref 0.44–1.00)
Glucose, Bld: 165 mg/dL — ABNORMAL HIGH (ref 70–99)
Potassium: 4.2 mmol/L (ref 3.5–5.1)
SODIUM: 129 mmol/L — AB (ref 135–145)

## 2018-03-12 LAB — CBC
HCT: 25.7 % — ABNORMAL LOW (ref 36.0–46.0)
Hemoglobin: 8.1 g/dL — ABNORMAL LOW (ref 12.0–15.0)
MCH: 24.2 pg — ABNORMAL LOW (ref 26.0–34.0)
MCHC: 31.5 g/dL (ref 30.0–36.0)
MCV: 76.7 fL — ABNORMAL LOW (ref 78.0–100.0)
PLATELETS: 262 10*3/uL (ref 150–400)
RBC: 3.35 MIL/uL — ABNORMAL LOW (ref 3.87–5.11)
RDW: 19 % — ABNORMAL HIGH (ref 11.5–15.5)
WBC: 9.6 10*3/uL (ref 4.0–10.5)

## 2018-03-12 LAB — MAGNESIUM: Magnesium: 1.4 mg/dL — ABNORMAL LOW (ref 1.7–2.4)

## 2018-03-12 LAB — PHOSPHORUS: PHOSPHORUS: 3.9 mg/dL (ref 2.5–4.6)

## 2018-03-13 LAB — EXPECTORATED SPUTUM ASSESSMENT W GRAM STAIN, RFLX TO RESP C

## 2018-03-13 LAB — BASIC METABOLIC PANEL
Anion gap: 8 (ref 5–15)
BUN: 17 mg/dL (ref 8–23)
CALCIUM: 8.5 mg/dL — AB (ref 8.9–10.3)
CHLORIDE: 98 mmol/L (ref 98–111)
CO2: 25 mmol/L (ref 22–32)
Creatinine, Ser: 0.52 mg/dL (ref 0.44–1.00)
GFR calc non Af Amer: >60 mL/min (ref >60)
Glucose, Bld: 149 mg/dL — ABNORMAL HIGH (ref 70–99)
POTASSIUM: 3.9 mmol/L (ref 3.5–5.1)
SODIUM: 131 mmol/L — AB (ref 135–145)

## 2018-03-13 LAB — URINE CULTURE: CULTURE: NO GROWTH

## 2018-03-13 LAB — MAGNESIUM: Magnesium: 1.6 mg/dL — ABNORMAL LOW (ref 1.7–2.4)

## 2018-03-13 LAB — EXPECTORATED SPUTUM ASSESSMENT W REFEX TO RESP CULTURE

## 2018-03-15 ENCOUNTER — Telehealth: Payer: Self-pay | Admitting: Oncology

## 2018-03-15 NOTE — Telephone Encounter (Signed)
[name] called and said Jo Parker has pneumonia which he thinks are from the swallowing studies.  He would like to know when she can be seen by Dr. Skeet Latch.

## 2018-03-16 ENCOUNTER — Telehealth: Payer: Self-pay | Admitting: Oncology

## 2018-03-16 LAB — CBC
HCT: 27.9 % — ABNORMAL LOW (ref 36.0–46.0)
Hemoglobin: 8.3 g/dL — ABNORMAL LOW (ref 12.0–15.0)
MCH: 22.8 pg — ABNORMAL LOW (ref 26.0–34.0)
MCHC: 29.7 g/dL — ABNORMAL LOW (ref 30.0–36.0)
MCV: 76.6 fL — AB (ref 80.0–100.0)
PLATELETS: 280 10*3/uL (ref 150–400)
RBC: 3.64 MIL/uL — AB (ref 3.87–5.11)
RDW: 18.6 % — AB (ref 11.5–15.5)
WBC: 7.4 10*3/uL (ref 4.0–10.5)
nRBC: 0 % (ref 0.0–0.2)

## 2018-03-16 LAB — BASIC METABOLIC PANEL
Anion gap: 7 (ref 5–15)
BUN: 18 mg/dL (ref 8–23)
CALCIUM: 8.9 mg/dL (ref 8.9–10.3)
CO2: 27 mmol/L (ref 22–32)
Chloride: 100 mmol/L (ref 98–111)
Creatinine, Ser: 0.62 mg/dL (ref 0.44–1.00)
GFR calc Af Amer: >60 mL/min (ref >60)
GLUCOSE: 141 mg/dL — AB (ref 70–99)
POTASSIUM: 3.9 mmol/L (ref 3.5–5.1)
Sodium: 134 mmol/L — ABNORMAL LOW (ref 135–145)

## 2018-03-16 LAB — MAGNESIUM: Magnesium: 1.2 mg/dL — ABNORMAL LOW (ref 1.7–2.4)

## 2018-03-16 NOTE — Telephone Encounter (Signed)
[name] called and said he had a meeting with the case manager at [employer].  He said they are planning her discharge for 03/23/18 if she continues to improve.  He said he is going to contact his cousin Jonelle Sidle for advise on whether she should be readmitted to [facility] or go home with home health.  He is leaning on home.  Advised him that we are working to see when she can come back to be seen in the clinic and will keep him informed.  He verbalized understanding.

## 2018-03-16 NOTE — Telephone Encounter (Signed)
[facility] regarding appointment at the [facility] with Dr. Denman George.  They said they usually do not transport patients to appointments out of the facility.  Spoke to the case manager who said she met with [name] (patient's son) yesterday.  She said they are planning on discharging her home with home health early next week.  She also said [name] would prefer for her to be readmitted to [facility].  She said [place] is doing well.  Her trach has been removed and she passed the swallow study.  She was advanced to a clear liquid diet but there was concern for aspiration pneumonia so she was changed back to NPO with TPN.

## 2018-03-17 ENCOUNTER — Telehealth: Payer: Self-pay | Admitting: Oncology

## 2018-03-17 LAB — CULTURE, BLOOD (ROUTINE X 2)
CULTURE: NO GROWTH
CULTURE: NO GROWTH
SPECIAL REQUESTS: ADEQUATE
Special Requests: ADEQUATE

## 2018-03-17 LAB — MAGNESIUM: Magnesium: 1.7 mg/dL (ref 1.7–2.4)

## 2018-03-17 NOTE — Telephone Encounter (Signed)
Jo Borrow, NP with [facility].  She said she has been talking with the speech pathologist and is going to order a modified barium swallow test and advised her diet if possible.  Discussed that if she fails the swallow study with thin liquids to try thickened liquids and weaning the TPN.  She said [name] is currently on Zosyn and Flagyl for the pneumonia and abscess.  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  Gave her our phone number to call with any questions.

## 2018-03-18 LAB — MAGNESIUM: Magnesium: 1.3 mg/dL — ABNORMAL LOW (ref 1.7–2.4)

## 2018-03-19 LAB — BASIC METABOLIC PANEL
ANION GAP: 10 (ref 5–15)
BUN: 14 mg/dL (ref 8–23)
CALCIUM: 8.7 mg/dL — AB (ref 8.9–10.3)
CO2: 26 mmol/L (ref 22–32)
Chloride: 100 mmol/L (ref 98–111)
Creatinine, Ser: 0.5 mg/dL (ref 0.44–1.00)
Glucose, Bld: 131 mg/dL — ABNORMAL HIGH (ref 70–99)
POTASSIUM: 3.2 mmol/L — AB (ref 3.5–5.1)
SODIUM: 136 mmol/L (ref 135–145)

## 2018-03-19 LAB — CBC
HCT: 28.1 % — ABNORMAL LOW (ref 36.0–46.0)
HEMOGLOBIN: 8.4 g/dL — AB (ref 12.0–15.0)
MCH: 22.7 pg — ABNORMAL LOW (ref 26.0–34.0)
MCHC: 29.9 g/dL — ABNORMAL LOW (ref 30.0–36.0)
MCV: 75.9 fL — ABNORMAL LOW (ref 80.0–100.0)
PLATELETS: 291 10*3/uL (ref 150–400)
RBC: 3.7 MIL/uL — ABNORMAL LOW (ref 3.87–5.11)
RDW: 18.7 % — AB (ref 11.5–15.5)
WBC: 5.8 10*3/uL (ref 4.0–10.5)
nRBC: 0 % (ref 0.0–0.2)

## 2018-03-19 LAB — MAGNESIUM: MAGNESIUM: 1.2 mg/dL — AB (ref 1.7–2.4)

## 2018-03-20 LAB — MAGNESIUM: MAGNESIUM: 1.7 mg/dL (ref 1.7–2.4)

## 2018-03-20 LAB — POTASSIUM: POTASSIUM: 3.2 mmol/L — AB (ref 3.5–5.1)

## 2018-03-21 LAB — BASIC METABOLIC PANEL
ANION GAP: 5 (ref 5–15)
BUN: 16 mg/dL (ref 8–23)
CHLORIDE: 103 mmol/L (ref 98–111)
CO2: 30 mmol/L (ref 22–32)
Calcium: 9.3 mg/dL (ref 8.9–10.3)
Creatinine, Ser: 0.57 mg/dL (ref 0.44–1.00)
GFR calc Af Amer: >60 mL/min (ref >60)
GFR calc non Af Amer: >60 mL/min (ref >60)
GLUCOSE: 127 mg/dL — AB (ref 70–99)
POTASSIUM: 4.5 mmol/L (ref 3.5–5.1)
Sodium: 138 mmol/L (ref 135–145)

## 2018-03-21 LAB — CBC
HCT: 29.5 % — ABNORMAL LOW (ref 36.0–46.0)
Hemoglobin: 9 g/dL — ABNORMAL LOW (ref 12.0–15.0)
MCH: 23.3 pg — ABNORMAL LOW (ref 26.0–34.0)
MCHC: 30.5 g/dL (ref 30.0–36.0)
MCV: 76.2 fL — AB (ref 80.0–100.0)
NRBC: 0 % (ref 0.0–0.2)
Platelets: 327 10*3/uL (ref 150–400)
RBC: 3.87 MIL/uL (ref 3.87–5.11)
RDW: 18.9 % — AB (ref 11.5–15.5)
WBC: 6.4 10*3/uL (ref 4.0–10.5)

## 2018-03-22 ENCOUNTER — Telehealth: Payer: Self-pay | Admitting: Oncology

## 2018-03-22 ENCOUNTER — Telehealth: Payer: Self-pay | Admitting: Hematology and Oncology

## 2018-03-22 ENCOUNTER — Other Ambulatory Visit (HOSPITAL_COMMUNITY): Payer: Self-pay

## 2018-03-22 ENCOUNTER — Telehealth: Payer: Self-pay | Admitting: *Deleted

## 2018-03-22 ENCOUNTER — Telehealth: Payer: Self-pay

## 2018-03-22 LAB — BASIC METABOLIC PANEL
Anion gap: 7 (ref 5–15)
BUN: 14 mg/dL (ref 8–23)
CHLORIDE: 102 mmol/L (ref 98–111)
CO2: 27 mmol/L (ref 22–32)
Calcium: 9.5 mg/dL (ref 8.9–10.3)
Creatinine, Ser: 0.58 mg/dL (ref 0.44–1.00)
GFR calc Af Amer: >60 mL/min (ref >60)
GFR calc non Af Amer: >60 mL/min (ref >60)
GLUCOSE: 103 mg/dL — AB (ref 70–99)
POTASSIUM: 3.9 mmol/L (ref 3.5–5.1)
Sodium: 136 mmol/L (ref 135–145)

## 2018-03-22 LAB — MAGNESIUM: Magnesium: 1.3 mg/dL — ABNORMAL LOW (ref 1.7–2.4)

## 2018-03-22 LAB — HEMOGLOBIN A1C
Hgb A1c MFr Bld: 6.1 % — ABNORMAL HIGH (ref 4.8–5.6)
Mean Plasma Glucose: 128.37 mg/dL

## 2018-03-22 NOTE — Telephone Encounter (Signed)
Appts scheduled patient notified per 10/15 sch msg

## 2018-03-22 NOTE — Telephone Encounter (Signed)
I have no availability next few days I can see her next Tuesday at 2 pm, 45 mins with labs Please coordinate with son

## 2018-03-22 NOTE — Telephone Encounter (Signed)
Caryl Pina from [facility] called and scheduled a hospital discharge appt. Appt scheduled for 11/7 at 2:15pm

## 2018-03-22 NOTE — Telephone Encounter (Addendum)
Eustace Pen, Case Manager with [facility] and advised her of appointment with Dr. Alvy Bimler on 03/29/18 at 2 pm with labs and port flush before the appointment.  Also called [name] and advised him of appointments.

## 2018-03-22 NOTE — Telephone Encounter (Signed)
Received message from scheduling. Caryl Pina, case manager with [facility] called and left a message. She is trying to schedule a follow up appt with Dr. Alvy Bimler. Ms. Muratore will be d/ced tomorrow.

## 2018-03-23 LAB — MAGNESIUM: Magnesium: 1.9 mg/dL (ref 1.7–2.4)

## 2018-03-24 ENCOUNTER — Telehealth: Payer: Self-pay | Admitting: Oncology

## 2018-03-24 ENCOUNTER — Telehealth: Payer: Self-pay

## 2018-03-24 NOTE — Telephone Encounter (Signed)
Jo Parker with [facility] called and left a message requesting orders from Dr. Alvy Bimler to d/c PICC line.  Called Jodi back. Per Dr. Alvy Bimler, she will sign orders to d/c PICC line. She verbalized understanding.

## 2018-03-24 NOTE — Telephone Encounter (Signed)
Jo Parker called and said his mom is now home.  He said they had trouble picking up her medications last night because the Outpatient [facility] was closed.  He said they did get them from another pharmacy but they were more expensive.  He also said she is doing well this morning.  She had a fever last night of 100 degrees but she had a lot of blankets on and a hat.  He said it was 99 this morning and she is taking tylenol.  Advised him to call back if they need anything.

## 2018-03-25 ENCOUNTER — Telehealth: Payer: Self-pay

## 2018-03-25 NOTE — Telephone Encounter (Signed)
Verbal orders given for [facility] OT and PT

## 2018-03-29 ENCOUNTER — Telehealth: Payer: Self-pay | Admitting: Hematology and Oncology

## 2018-03-29 ENCOUNTER — Inpatient Hospital Stay: Payer: Medicare PPO

## 2018-03-29 ENCOUNTER — Encounter: Payer: Self-pay | Admitting: Oncology

## 2018-03-29 ENCOUNTER — Inpatient Hospital Stay: Payer: Medicare PPO | Attending: Hematology and Oncology | Admitting: Hematology and Oncology

## 2018-03-29 DIAGNOSIS — T451X5A Adverse effect of antineoplastic and immunosuppressive drugs, initial encounter: Secondary | ICD-10-CM

## 2018-03-29 DIAGNOSIS — I959 Hypotension, unspecified: Secondary | ICD-10-CM | POA: Diagnosis not present

## 2018-03-29 DIAGNOSIS — C482 Malignant neoplasm of peritoneum, unspecified: Secondary | ICD-10-CM | POA: Insufficient documentation

## 2018-03-29 DIAGNOSIS — R000 Tachycardia, unspecified: Secondary | ICD-10-CM | POA: Diagnosis not present

## 2018-03-29 DIAGNOSIS — E860 Dehydration: Secondary | ICD-10-CM | POA: Insufficient documentation

## 2018-03-29 DIAGNOSIS — Z23 Encounter for immunization: Secondary | ICD-10-CM | POA: Diagnosis not present

## 2018-03-29 DIAGNOSIS — E876 Hypokalemia: Secondary | ICD-10-CM | POA: Insufficient documentation

## 2018-03-29 DIAGNOSIS — D509 Iron deficiency anemia, unspecified: Secondary | ICD-10-CM | POA: Diagnosis not present

## 2018-03-29 DIAGNOSIS — D6481 Anemia due to antineoplastic chemotherapy: Secondary | ICD-10-CM | POA: Insufficient documentation

## 2018-03-29 DIAGNOSIS — E43 Unspecified severe protein-calorie malnutrition: Secondary | ICD-10-CM | POA: Insufficient documentation

## 2018-03-29 LAB — CBC WITH DIFFERENTIAL (CANCER CENTER ONLY)
Abs Immature Granulocytes: 0.01 10*3/uL (ref 0.00–0.07)
Basophils Absolute: 0 10*3/uL (ref 0.0–0.1)
Basophils Relative: 0 %
EOS PCT: 2 %
Eosinophils Absolute: 0.1 10*3/uL (ref 0.0–0.5)
HEMATOCRIT: 30.4 % — AB (ref 36.0–46.0)
HEMOGLOBIN: 9.4 g/dL — AB (ref 12.0–15.0)
Immature Granulocytes: 0 %
LYMPHS PCT: 27 %
Lymphs Abs: 2 10*3/uL (ref 0.7–4.0)
MCH: 22.5 pg — AB (ref 26.0–34.0)
MCHC: 30.9 g/dL (ref 30.0–36.0)
MCV: 72.9 fL — ABNORMAL LOW (ref 80.0–100.0)
Monocytes Absolute: 0.6 10*3/uL (ref 0.1–1.0)
Monocytes Relative: 8 %
Neutro Abs: 4.6 10*3/uL (ref 1.7–7.7)
Neutrophils Relative %: 63 %
Platelet Count: 301 10*3/uL (ref 150–400)
RBC: 4.17 MIL/uL (ref 3.87–5.11)
RDW: 18.1 % — ABNORMAL HIGH (ref 11.5–15.5)
WBC Count: 7.4 10*3/uL (ref 4.0–10.5)
nRBC: 0 % (ref 0.0–0.2)

## 2018-03-29 LAB — CMP (CANCER CENTER ONLY)
ALT: 9 U/L (ref 0–44)
AST: 24 U/L (ref 15–41)
Albumin: 2.6 g/dL — ABNORMAL LOW (ref 3.5–5.0)
Alkaline Phosphatase: 104 U/L (ref 38–126)
Anion gap: 12 (ref 5–15)
BUN: 14 mg/dL (ref 8–23)
CHLORIDE: 98 mmol/L (ref 98–111)
CO2: 27 mmol/L (ref 22–32)
CREATININE: 0.76 mg/dL (ref 0.44–1.00)
Calcium: 9.3 mg/dL (ref 8.9–10.3)
GFR, Est AFR Am: >60 mL/min (ref >60)
GLUCOSE: 140 mg/dL — AB (ref 70–99)
Potassium: 3.1 mmol/L — ABNORMAL LOW (ref 3.5–5.1)
SODIUM: 137 mmol/L (ref 135–145)
Total Bilirubin: 0.4 mg/dL (ref 0.3–1.2)
Total Protein: 9 g/dL — ABNORMAL HIGH (ref 6.5–8.1)

## 2018-03-29 MED ORDER — SODIUM CHLORIDE 0.45 % IV SOLN
Freq: Once | INTRAVENOUS | Status: AC
  Administered 2018-03-29: 16:00:00 via INTRAVENOUS
  Filled 2018-03-29: qty 1000

## 2018-03-29 MED ORDER — SODIUM CHLORIDE 0.9% FLUSH
10.0000 mL | Freq: Once | INTRAVENOUS | Status: AC
  Administered 2018-03-29: 10 mL
  Filled 2018-03-29: qty 10

## 2018-03-29 MED ORDER — HEPARIN SOD (PORK) LOCK FLUSH 100 UNIT/ML IV SOLN
500.0000 [IU] | Freq: Once | INTRAVENOUS | Status: AC
  Administered 2018-03-29: 500 [IU]
  Filled 2018-03-29: qty 5

## 2018-03-29 NOTE — Patient Instructions (Signed)
Dehydration, Adult Dehydration is when there is not enough fluid or water in your body. This happens when you lose more fluids than you take in. Dehydration can range from mild to very bad. It should be treated right away to keep it from getting very bad. Symptoms of mild dehydration may include:  Thirst.  Dry lips.  Slightly dry mouth.  Dry, warm skin.  Dizziness. Symptoms of moderate dehydration may include:  Very dry mouth.  Muscle cramps.  Dark pee (urine). Pee may be the color of tea.  Your body making less pee.  Your eyes making fewer tears.  Heartbeat that is uneven or faster than normal (palpitations).  Headache.  Light-headedness, especially when you stand up from sitting.  Fainting (syncope). Symptoms of very bad dehydration may include:  Changes in skin, such as: ? Cold and clammy skin. ? Blotchy (mottled) or pale skin. ? Skin that does not quickly return to normal after being lightly pinched and let go (poor skin turgor).  Changes in body fluids, such as: ? Feeling very thirsty. ? Your eyes making fewer tears. ? Not sweating when body temperature is high, such as in hot weather. ? Your body making very little pee.  Changes in vital signs, such as: ? Weak pulse. ? Pulse that is more than 100 beats a minute when you are sitting still. ? Fast breathing. ? Low blood pressure.  Other changes, such as: ? Sunken eyes. ? Cold hands and feet. ? Confusion. ? Lack of energy (lethargy). ? Trouble waking up from sleep. ? Short-term weight loss. ? Unconsciousness. Follow these instructions at home:  If told by your doctor, drink an ORS: ? Make an ORS by using instructions on the package. ? Start by drinking small amounts, about  cup (120 mL) every 5-10 minutes. ? Slowly drink more until you have had the amount that your doctor said to have.  Drink enough clear fluid to keep your pee clear or pale yellow. If you were told to drink an ORS, finish the ORS  first, then start slowly drinking clear fluids. Drink fluids such as: ? Water. Do not drink only water by itself. Doing that can make the salt (sodium) level in your body get too low (hyponatremia). ? Ice chips. ? Fruit juice that you have added water to (diluted). ? Low-calorie sports drinks.  Avoid: ? Alcohol. ? Drinks that have a lot of sugar. These include high-calorie sports drinks, fruit juice that does not have water added, and soda. ? Caffeine. ? Foods that are greasy or have a lot of fat or sugar.  Take over-the-counter and prescription medicines only as told by your doctor.  Do not take salt tablets. Doing that can make the salt level in your body get too high (hypernatremia).  Eat foods that have minerals (electrolytes). Examples include bananas, oranges, potatoes, tomatoes, and spinach.  Keep all follow-up visits as told by your doctor. This is important. Contact a doctor if:  You have belly (abdominal) pain that: ? Gets worse. ? Stays in one area (localizes).  You have a rash.  You have a stiff neck.  You get angry or annoyed more easily than normal (irritability).  You are more sleepy than normal.  You have a harder time waking up than normal.  You feel: ? Weak. ? Dizzy. ? Very thirsty.  You have peed (urinated) only a small amount of very dark pee during 6-8 hours. Get help right away if:  You have symptoms of   very bad dehydration.  You cannot drink fluids without throwing up (vomiting).  Your symptoms get worse with treatment.  You have a fever.  You have a very bad headache.  You are throwing up or having watery poop (diarrhea) and it: ? Gets worse. ? Does not go away.  You have blood or something green (bile) in your throw-up.  You have blood in your poop (stool). This may cause poop to look black and tarry.  You have not peed in 6-8 hours.  You pass out (faint).  Your heart rate when you are sitting still is more than 100 beats a  minute.  You have trouble breathing. This information is not intended to replace advice given to you by your health care provider. Make sure you discuss any questions you have with your health care provider. Document Released: 03/21/2009 Document Revised: 12/13/2015 Document Reviewed: 07/19/2015 Elsevier Interactive Patient Education  2018 [employer]  

## 2018-03-29 NOTE — Telephone Encounter (Signed)
Gave avs and calendar ° °

## 2018-03-29 NOTE — Progress Notes (Signed)
Advised Jo Parker and son, Jo Parker, to stop taking Lasix and Clonidine per Dr. Alvy Bimler.  Also asked if she has finished the flagyl and Lovenox.  They said she has about 4 doses left.  Advised them to finish both per Dr. Alvy Bimler.  Also discussed her fentanyl patch and that Dr. Alvy Bimler said to continue it for now and she will reaccess on Tuesday.  Asked that they bring all her pill bottles to her next visit and to check her BP in the morning and night and bring with her to her next appointment.  They also were provided with a written list of what was discussed. They verbalized understanding and agreement.

## 2018-03-30 ENCOUNTER — Encounter: Payer: Self-pay | Admitting: Hematology and Oncology

## 2018-03-30 ENCOUNTER — Other Ambulatory Visit: Payer: Self-pay | Admitting: Gynecologic Oncology

## 2018-03-30 DIAGNOSIS — E876 Hypokalemia: Secondary | ICD-10-CM | POA: Insufficient documentation

## 2018-03-30 DIAGNOSIS — I959 Hypotension, unspecified: Secondary | ICD-10-CM | POA: Insufficient documentation

## 2018-03-30 DIAGNOSIS — E860 Dehydration: Secondary | ICD-10-CM | POA: Insufficient documentation

## 2018-03-30 DIAGNOSIS — N739 Female pelvic inflammatory disease, unspecified: Secondary | ICD-10-CM

## 2018-03-30 LAB — CA 125: CANCER ANTIGEN (CA) 125: 35.3 U/mL (ref 0.0–38.1)

## 2018-03-30 NOTE — Assessment & Plan Note (Signed)
She has a very prolonged course of hospitalization due to postoperative complications She is recovering well at home We will continue aggressive supportive care I will see her back next week for further follow-up

## 2018-03-30 NOTE — Progress Notes (Signed)
[place] OFFICE PROGRESS NOTE  Patient Care Team: [name], MD as PCP - General (Hematology and Oncology)  ASSESSMENT & PLAN:  Primary peritoneal carcinomatosis [facility]) She has a very prolonged course of hospitalization due to postoperative complications She is recovering well at home We will continue aggressive supportive care I will see her back next week for further follow-up  Severe protein-calorie malnutrition ([place]) She has lost a lot of weight We discussed the importance of frequent small meals and food diary  Dehydration She is tachycardic and appears clinically dehydrated We will give her some IV fluids today I will consult advanced home care service to give her IV fluids daily with normal saline 1 L over 2 to 4 hours  Hypokalemia, inadequate intake Likely related to recent diarrhea.  Monitor closely  Hypotension She is dizzy and is noted to be on multiple different antihypertensives I recommend discontinuation of furosemide due to high risk of dehydration along with clonidine due to high risk of falls Family members are advised to take blood pressure at least twice a day and I will review her medications again next week  Anemia due to antineoplastic chemotherapy She has multifactorial anemia.  Will check iron studies in her next visit   Orders Placed This Encounter  Procedures  . Magnesium    Standing Status:   Standing    Number of Occurrences:   9    Standing Expiration Date:   03/30/2019  . Iron and TIBC    Standing Status:   Future    Standing Expiration Date:   05/04/2019  . Sedimentation rate    Standing Status:   Future    Standing Expiration Date:   05/04/2019  . Ferritin    Standing Status:   Future    Standing Expiration Date:   05/04/2019    INTERVAL HISTORY: Please see below for problem oriented charting. She returns with her son, [name] for further follow-up She is very weak since discharge from hospital She has physical  therapy at home Her intake is poor She has frequent diarrhea and lack of appetite She still have persistent drain in her abdomen.  No recent bleeding Denies recent fever, chills or cough.  SUMMARY OF ONCOLOGIC HISTORY: Oncology History   BRCA2 positive Primary peritoneal, high grade serous     Primary peritoneal carcinomatosis ([place])   08/18/2017 Imaging    US imaging showed scular AAA measured 3.8 cm, liver nodularity with ascites and pleural effusions    09/03/2017 Imaging    CT chest elsewhere showed small bilateral pleural effusion    09/13/2017 Pathology Results    Fluid cytology positive for adenocarcinoma, strongly positive for CK7, CA-125, moderate to strong MOC-31 and B72.3. CK20, CEA and Ca19-9 were negative    09/16/2017 Imaging    CT abdomen and pelvis elsewhere showed small bilateral pleural effusion, cardiomegaly, 2.6 cm ovoid cystic mass at the pancreatic tail, 3.5 cm saccular aneurysm, no retroperitoneal lymphadenopathy, marked sigmoid wall thickening without overt mechanical bowel obstruction, marked omental thickening and ascites.    10/07/2017 Genetic Testing    BRCA2 c.5073dupA (p.Trp1692Metfs*3) pathogenic mutation and APC c.7088A>C (p.Lys2363Arg), POLE c.1007A>G (p.Asn36Ser) and RAD51D c.434G>A (p.Arg145His) VUS identified the [facility] panel.  The [facility] gene panel offered by [employer] includes sequencing and deletion/duplication testing of the following 35 genes: APC, ATM, AXIN2, BARD1, BMPR1A, BRCA1, BRCA2, BRIP1, CHD1, CDK4, CDKN2A, CHEK2, EPCAM (large rearrangement only), HOXB13, (sequencing only), GALNT12, MLH1, MSH2, MSH3 (excluding repetitive portions of exon  1), MSH6, MUTYH, NBN, NTHL1, PALB2, PMS2, PTEN, RAD51C, RAD51D, RNF43, RPS20, SMAD4, STK11, and TP53. Sequencing was performed for select regions of POLE and POLD1, and large rearrangement analysis was performed for select regions of GREM1. The report date is Oct 07, 2017.    10/08/2017 Procedure     Extensive omental and peritoneal tumor is present anteriorly deep to the abdominal wall. From a midline approach, tumor was sampled just to the left of midline in the upper pelvis, below the level of the umbilicus. Solid tissue was obtained.  IMPRESSION: CT-guided core biopsy performed of omental/peritoneal tumor in the anterior peritoneal cavity.    10/08/2017 Pathology Results    Soft Tissue Needle Core Biopsy, Omentum/Peritoneal Cavity - POORLY DIFFERENTIATED CARCINOMA. Microscopic Comment Immunohistochemistry will be performed and reported as an addendum. Dr. [name] agrees. Called to [employer] on 10/11/17. (JDP:kh 10/11/17) ADDENDUM: Immunohistochemistry shows the tumor is positive with cytokeratin AE1/AE3, cytokeratin 7, estrogen receptor, WT-1, and p16. The tumor is negative with CD56, chromogranin, synaptophysin, CDX-2, cytokeratin 20, progesterone receptor, GATA-3, GCDFP, Napsin-A and thyroid transcription factor-1. 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 The catheter is ready for use.    10/14/2017 Procedure    A total of approximately 1300 mL of ascitic fluid was removed.    10/15/2017 Tumor Marker    Patient's tumor was tested for the following markers: CA-125 Results of the tumor marker test revealed 775.9    10/18/2017 -  Chemotherapy    The patient had carboplatin and Taxol    10/22/2017 - 10/27/2017 Hospital Admission    She was admitted to the hospital for evaluation of shortness of breath and was found to have large pleural effusion, successfully removed and cytology was malignant.  Sepsis was ruled  out.    10/23/2017 Imaging    No evidence of pulmonary embolism.  New moderate size left effusion and new small to moderate right effusion with associated bibasilar atelectasis.  Known extensive peritoneal carcinomatosis and moderate ascites.  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 Recommend correlation with prior exams and vascular surgery consultation.  2.1 cm left renal cyst.  Mild cardiomegaly and minimal atherosclerotic coronary artery disease.  Aortic Atherosclerosis (ICD10-I70.0) and Emphysema (ICD10-J43.9).    10/24/2017 Pathology Results    PLEURAL FLUID, LEFT (SPECIMEN 1 OF 1 COLLECTED 10/24/17): MALIGNANT CELLS CONSISTENT WITH METASTATIC ADENOCARCINOMA. SEE COMMENT.    10/24/2017 Procedure    Successful ultrasound guided left thoracentesis yielding 580 mL of pleural fluid.  Follow-up chest radiograph shows no pneumothorax    11/03/2017 Tumor Marker    Patient's tumor was tested for the following markers: CA-125 Results of the tumor marker test revealed 991.9    11/29/2017 Tumor Marker    Patient's tumor was tested for the following markers: CA-125 Results of the tumor marker test revealed 133    12/14/2017 Imaging    Decreased peritoneal carcinomatosis, and near complete resolution of ascites since prior exam.  No new or progressive disease within the abdomen or pelvis.  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  Skeet Latch, M.D. 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  Miliary disease of the bilateral diaphragmatic surfaces and also the mesentery of the large and small bowel.  Left adnexa densely adherent to the distal rectosigmoid colon.  Right adnexa adherent to the pelvic sidewall.  Multiple loops of bowel adherent to miliary disease in the vaginal cuff.  At the completion of procedure the patient was R1 disease status     01/27/2018 Pathology Results    1. Omentum, resection for tumor, infragastric - HIGH GRADE SEROUS CARCINOMA, >2 CM. - SEE ONCOLOGY TABLE. 2. Soft tissue mass, simple excision, abdominal wall nodule - HIGH GRADE SEROUS CARCINOMA, >2 CM. - SEE ONCOLOGY TABLE. 3. Ovary and fallopian tube, left - OVARY AND FALLOPIAN TUBE WITH SURFACE SEROUS CARCINOMA. 4. Ovary and fallopian tube, right - OVARY AND FALLOPIAN TUBE WITH SURFACE SEROUS CARCINOMA. Microscopic Comment 1. OVARY or FALLOPIAN TUBE or PRIMARY PERITONEUM: Procedure: Omentectomy, soft tissue excision, bilateral salpingo-oophorectomy. Specimen Integrity: Intact. Tumor Site: See comment. Ovarian Surface Involvement (required only if applicable): Present, bilateral. Fallopian Tube Surface Involvement (required only if applicable): Present, bilateral. Tumor Size: >2 cm, see comment. Histologic Type: High grade serous carcinoma. Histologic Grade: High grade. Implants (required for advanced stage serous/seromucinous borderline tumors only): See comment. Other Tissue/ Organ Involvement: Bilateral  fallopian tubes, bilateral ovaries, omentum, peritoneum. Largest Extrapelvic Peritoneal Focus (required only if applicable): >2 cm. Peritoneal/Ascitic Fluid: N/A. Pleural effusion ([id number]) positive. Treatment Effect (required only for high-grade serous carcinomas): CRS-1 Regional Lymph Nodes: No lymph nodes submitted or found Pathologic Stage Classification (pTNM, AJCC 8th Edition): ypT3c, ypNX, pM1a Representative Tumor Block: 1A-B Comment(s): The omentum is largely infiltrated by tumor (>2 cm). Both ovaries and fallopian tubes were entirely submitted. A distinct precursor lesion is not identified, only surface deposits. While it is not recommended to diagnose a primary peritoneal carcinoma after treatment, it is assumed the likely primary given the lack of precursor lesion or significant disease in the ovaries and fallopian tubes.    01/27/2018 - 03/23/2018 Hospital Admission    She was admitted to the hospital for interval debulking surgery, complicated by sepsis and respiratory failure.    03/08/2018 Imaging    7.0 x 1.3 cm left pelvic fluid collection is noted with air-fluid level and enhancing margins, most consistent with abscess. It is slightly smaller compared to prior exam.  Stable position of surgical drainage catheter is seen in the pelvis.  Sigmoid diverticulosis is noted without inflammation.  3.9 cm aortic aneurysm is noted at aortic hiatus. Recommend followup by [name] in 2 years. This recommendation follows ACR consensus guidelines: White Paper of the ACR Incidental Findings Committee II on Vascular Findings. J Am Coll Radiol 2013; 10:789-794.  03/29/2018 Tumor Marker    Patient's tumor was tested for the following markers: CA-125 Results of the tumor marker test revealed 35.3     REVIEW OF SYSTEMS:   Constitutional: Denies fevers, chills Eyes: Denies blurriness of vision Ears, nose, mouth, throat, and face: Denies mucositis or sore throat Respiratory: Denies  cough, dyspnea or wheezes Cardiovascular: Denies palpitation, chest discomfort or lower extremity swelling Skin: Denies abnormal skin rashes Lymphatics: Denies new lymphadenopathy or easy bruising Behavioral/Psych: Mood is stable, no new changes  All other systems were reviewed with the patient and are negative.  I have reviewed the past medical history, past surgical history, social history and family history with the patient and they are unchanged from previous note.  ALLERGIES:  is allergic to oxycodone.  MEDICATIONS:  Current Outpatient Medications  Medication Sig Dispense Refill  . acetaminophen (TYLENOL) 325 MG tablet Take 2 tablets (650 mg total) by mouth every 6 (six) hours as needed for mild pain, fever or headache.    . cloNIDine (CATAPRES - DOSED IN MG/24 HR) 0.2 mg/24hr patch Place 1 patch (0.2 mg total) onto the skin once a week. 4 patch 12  . enalapril (VASOTEC) 5 MG tablet Take 1 tablet by mouth daily.  0  . enoxaparin (LOVENOX) 40 MG/0.4ML injection Inject 0.4 mLs (40 mg total) into the skin daily. 0 Syringe   . famotidine (PEPCID) 20 MG tablet TK 1 T PO BID  0  . fentaNYL (DURAGESIC - DOSED MCG/HR) 12 MCG/HR UNW AND APP 1 PA TO SKIN Q 72 H  0  . furosemide (LASIX) 40 MG tablet TK 1 T PO QD  0  . MAGNESIUM-OXIDE 400 (241.3 Mg) MG tablet TK 2 TS PO BID X 3 DAYS  0  . metoprolol succinate (TOPROL-XL) 25 MG 24 hr tablet TK 1 T PO BID  0  . metroNIDAZOLE (FLAGYL) 500 MG tablet TK 1 T PO TID X 5 DAYS  0  . mirtazapine (REMERON) 15 MG tablet TK 1 T PO QHS  0   No current facility-administered medications for this visit.     PHYSICAL EXAMINATION: ECOG PERFORMANCE STATUS: 3 - Symptomatic, >50% confined to bed  Vitals:   03/29/18 1419  BP: 123/73  Pulse: (!) 114  Resp: 18  Temp: 99.1 F (37.3 C)  SpO2: 99%   There were no vitals filed for this visit.  GENERAL:alert, no distress and comfortable.  She is thin and cachectic SKIN: skin color, texture, turgor are normal,  no rashes or significant lesions EYES: normal, Conjunctiva are pink and non-injected, sclera clear OROPHARYNX: Dry mucous membrane is noted NECK: supple, thyroid normal size, non-tender, without nodularity LYMPH:  no palpable lymphadenopathy in the cervical, axillary or inguinal LUNGS: clear to auscultation and percussion with normal breathing effort HEART: Tachycardia, no murmurs and no lower extremity edema ABDOMEN:abdomen soft, non-tender and normal bowel sounds.  Drain in situ Musculoskeletal:no cyanosis of digits and no clubbing  NEURO: alert & oriented x 3 with fluent speech, no focal motor/sensory deficits  LABORATORY DATA:  I have reviewed the data as listed    Component Value Date/Time   NA 137 03/29/2018 1330   K 3.1 (L) 03/29/2018 1330   CL 98 03/29/2018 1330   CO2 27 03/29/2018 1330   GLUCOSE 140 (H) 03/29/2018 1330   BUN 14 03/29/2018 1330   CREATININE 0.76 03/29/2018 1330   CALCIUM 9.3 03/29/2018 1330   PROT 9.0 (H) 03/29/2018 1330   ALBUMIN 2.6 (L) 03/29/2018 1330  AST 24 03/29/2018 1330   ALT 9 03/29/2018 1330   ALKPHOS 104 03/29/2018 1330   BILITOT 0.4 03/29/2018 1330   GFRNONAA >60 03/29/2018 1330   GFRAA >60 03/29/2018 1330    No results found for: SPEP, UPEP  Lab Results  Component Value Date   WBC 7.4 03/29/2018   NEUTROABS 4.6 03/29/2018   HGB 9.4 (L) 03/29/2018   HCT 30.4 (L) 03/29/2018   MCV 72.9 (L) 03/29/2018   PLT 301 03/29/2018      Chemistry      Component Value Date/Time   NA 137 03/29/2018 1330   K 3.1 (L) 03/29/2018 1330   CL 98 03/29/2018 1330   CO2 27 03/29/2018 1330   BUN 14 03/29/2018 1330   CREATININE 0.76 03/29/2018 1330      Component Value Date/Time   CALCIUM 9.3 03/29/2018 1330   ALKPHOS 104 03/29/2018 1330   AST 24 03/29/2018 1330   ALT 9 03/29/2018 1330   BILITOT 0.4 03/29/2018 1330       RADIOGRAPHIC STUDIES: I have personally reviewed the radiological images as listed and agreed with the findings in the  report. Dg Abd 1 View  Result Date: 03/02/2018 CLINICAL DATA:  Ileus EXAM: ABDOMEN - 1 VIEW COMPARISON:  02/28/2018 FINDINGS: Stable NG tube. Surgical drains projecting over the left hemipelvis are stable. Bowel distension has again improved. Minimally distended small bowel loops continue to project over the abdomen. There is gas in the colon. IMPRESSION: Improving ileus. Electronically Signed   By: Marybelle Killings M.D.   On: 03/02/2018 10:42   Dg Abd 1 View  Result Date: 02/28/2018 CLINICAL DATA:  Nasogastric tube placement. EXAM: ABDOMEN - 1 VIEW COMPARISON:  None. FINDINGS: Nasogastric tube with the tip projecting over the stomach. Right lower lobe airspace disease. There is no bowel dilatation to suggest obstruction. There is no evidence of pneumoperitoneum, portal venous gas or pneumatosis. There are no pathologic calcifications along the expected course of the ureters. The osseous structures are unremarkable. IMPRESSION: 1. Nasogastric tube with the tip projecting over the stomach. 2. Right lower lobe airspace disease concerning for pneumonia. Electronically Signed   By: Kathreen Devoid   On: 02/28/2018 12:30   Ct Head Wo Contrast  Result Date: 03/03/2018 CLINICAL DATA:  Unexplained altered level of consciousness. EXAM: CT HEAD WITHOUT CONTRAST TECHNIQUE: Contiguous axial images were obtained from the base of the skull through the vertex without intravenous contrast. COMPARISON:  None. FINDINGS: Brain: Moderately enlarged ventricles and subarachnoid spaces. Moderate patchy white matter low density in both cerebral hemispheres. No intracranial hemorrhage, mass lesion or CT evidence of acute infarction. Vascular: No hyperdense vessel or unexpected calcification. Skull: Left frontal post craniotomy changes. Sinuses/Orbits: Unremarkable. 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 IMPRESSION: 1. No acute abnormality. 2. Moderate diffuse cerebral and cerebellar atrophy. 3. Moderate chronic small  vessel white matter ischemic changes in both cerebral hemispheres. 4. 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 Electronically Signed   By: Claudie Revering M.D.   On: 03/03/2018 13:50   Ct Abdomen Pelvis W Contrast  Result Date: 03/08/2018 CLINICAL DATA:  Status post drainage of abdominal abscess. EXAM: CT ABDOMEN AND PELVIS WITH CONTRAST TECHNIQUE: Multidetector CT imaging of the abdomen and pelvis was performed using the standard protocol following bolus administration of intravenous contrast. CONTRAST:  75 mL ISOVUE-300 IOPAMIDOL (ISOVUE-300) INJECTION 61% COMPARISON:  CT scan of February 23, 2018. FINDINGS: Lower chest: Minimal right posterior basilar subsegmental atelectasis is  noted. Hepatobiliary: No focal liver abnormality is seen. No gallstones, gallbladder wall thickening, or biliary dilatation. Pancreas: Unremarkable. No pancreatic ductal dilatation or surrounding inflammatory changes. Spleen: Normal in size without focal abnormality. Adrenals/Urinary Tract: Adrenal glands appear normal. No hydronephrosis or renal obstruction is noted. Left renal cyst is noted. Urinary bladder is unremarkable. Stomach/Bowel: The stomach appears normal. There is no evidence of bowel obstruction or inflammation. Rectal tube is noted. Sigmoid diverticulosis is noted without inflammation. The appendix is not visualized. Vascular/Lymphatic: 3.9 cm aortic aneurysm is noted at aortic hiatus. Atherosclerosis of abdominal aorta is noted. No adenopathy is noted. Reproductive: Status post hysterectomy. No adnexal masses. Other: 7.0 x 1.3 cm left pelvic fluid collection is noted with air-fluid level and enhancing margins, most consistent with abscess. This is slightly smaller compared to prior exam. Stable position of surgical drainage catheter seen in the pelvis. Musculoskeletal: No acute or significant osseous findings. IMPRESSION: 7.0 x 1.3 cm left pelvic fluid collection is noted with air-fluid level and  enhancing margins, most consistent with abscess. It is slightly smaller compared to prior exam. Stable position of surgical drainage catheter is seen in the pelvis. Sigmoid diverticulosis is noted without inflammation. 3.9 cm aortic aneurysm is noted at aortic hiatus. Recommend followup by [name] in 2 years. This recommendation follows ACR consensus guidelines: White Paper of the ACR Incidental Findings Committee II on Vascular Findings. J Am Coll Radiol 2013; 10:789-794. Electronically Signed   By: Marijo Conception, M.D.   On: 03/08/2018 13:17   Dg Chest Port 1 View  Result Date: 03/12/2018 CLINICAL DATA:  Bilateral aspiration pneumonia. EXAM: PORTABLE CHEST 1 VIEW COMPARISON:  03/05/2018. FINDINGS: Stable borderline enlarged cardiac silhouette and right jugular porta catheter. The tracheostomy tube has been removed. The left PICC tip is in the superior vena cava proximally 2 cm above the superior cavoatrial junction. No significant change in patchy opacity in the right upper lung zone and small amount of linear atelectasis or scarring in the left mid to lower lung zone. The overall lung volumes remain hyperexpanded with stable mild prominence of the interstitial markings. No pleural fluid. Unremarkable bones. Probable loop recorder overlying the left heart. IMPRESSION: 1. Stable right upper lung pneumonia. 2. Stable mild changes of COPD. Electronically Signed   By: Claudie Revering M.D.   On: 03/12/2018 08:48   Dg Chest Port 1 View  Result Date: 03/05/2018 CLINICAL DATA:  Respiratory failure. History of hypertension and COPD. EXAM: PORTABLE CHEST 1 VIEW COMPARISON:  03/03/2018; 02/23/2018; 02/05/2018 FINDINGS: Evaluation of the left lung base is degraded secondary to the patient's overlying left hand. Grossly unchanged cardiac silhouette and mediastinal contours with atherosclerotic plaque within the thoracic aorta. Stable position of support apparatus. No pneumothorax. Lungs remain hyperexpanded with flattening  the bilaterally diaphragms. Ill-defined heterogeneous airspace opacities with the peripheral aspect the right upper/mid lung are unchanged. Improved aeration of lungs with persistent thickening of the pulmonary interstitium. No new focal airspace opacities. No pleural effusion. No acute osseus abnormality. IMPRESSION: 1.  Stable positioning of support apparatus.  No pneumothorax. 2. Minimally improved aeration of lungs with persistent heterogeneous airspace opacities within the peripheral aspect of the right upper/mid lung worrisome for residual infection. Electronically Signed   By: Sandi Mariscal M.D.   On: 03/05/2018 09:22   Dg Chest Port 1 View  Result Date: 03/03/2018 CLINICAL DATA:  Fever, history COPD, uterine cancer, carcinomatosis, hypertension EXAM: PORTABLE CHEST 1 VIEW COMPARISON:  Portable exam 0750 hours compared to 02/23/2018 FINDINGS: Tracheostomy  tube projects over tracheal air column. RIGHT jugular Port-A-Cath with tip projecting over SVC, stable. Loop recorder projects over lower LEFT chest. LEFT arm PICC line tip projects over SVC. Borderline enlargement of cardiac silhouette. Persistent RIGHT upper and RIGHT lower lobe infiltrates. Additional scattered opacities in LEFT lung likely infiltrate, greatest at base. No pleural effusion or pneumothorax. Bones demineralized. IMPRESSION: Persistent pulmonary infiltrates greater on RIGHT. Electronically Signed   By: Lavonia Dana M.D.   On: 03/03/2018 09:49   Dg Abd Portable 1v  Result Date: 03/05/2018 CLINICAL DATA:  Ileus EXAM: PORTABLE ABDOMEN - 1 VIEW COMPARISON:  03/02/2018; 02/28/2018; 02/22/2018; CT abdomen and pelvis - 02/23/2018 FINDINGS: Surgical drain overlies the left lower abdomen. Redemonstrated moderate gas distention of several loops of mildly dilated patulous appearing small bowel with index loop within the right lower quadrant measuring approximately 3 cm in diameter. Air is seen within the colon. No supine evidence of  pneumoperitoneum. No pneumatosis or portal venous gas. Atherosclerotic plaque within the abdominal aorta. No definitive abnormal intra-abdominal calcifications. Interval removal of enteric tube. IMPRESSION: No change to slight improvement in suspected postoperative ileus. No evidence of enteric obstruction. Electronically Signed   By: Sandi Mariscal M.D.   On: 03/05/2018 09:20   Dg Swallowing Func-speech Pathology  Result Date: 03/22/2018 Please refer to "Notes" tab for Speech Pathology notes.   All questions were answered. The patient knows to call the clinic with any problems, questions or concerns. No barriers to learning was detected.  I spent 40 minutes counseling the patient face to face. The total time spent in the appointment was 55 minutes and more than 50% was on counseling and review of test results  [name], MD 03/30/2018 8:29 AM

## 2018-03-30 NOTE — Assessment & Plan Note (Signed)
She is dizzy and is noted to be on multiple different antihypertensives I recommend discontinuation of furosemide due to high risk of dehydration along with clonidine due to high risk of falls Family members are advised to take blood pressure at least twice a day and I will review her medications again next week

## 2018-03-30 NOTE — Assessment & Plan Note (Signed)
She has lost a lot of weight We discussed the importance of frequent small meals and food diary

## 2018-03-30 NOTE — Assessment & Plan Note (Signed)
She has multifactorial anemia.  Will check iron studies in her next visit

## 2018-03-30 NOTE — Assessment & Plan Note (Signed)
She is tachycardic and appears clinically dehydrated We will give her some IV fluids today I will consult advanced home care service to give her IV fluids daily with normal saline 1 L over 2 to 4 hours

## 2018-03-30 NOTE — Progress Notes (Signed)
CT AP prior to appt with Dr. Skeet Latch to follow up on pelvic fluid collection since JP drain in still in place

## 2018-03-30 NOTE — Assessment & Plan Note (Signed)
Likely related to recent diarrhea.  Monitor closely

## 2018-03-31 ENCOUNTER — Telehealth: Payer: Self-pay

## 2018-03-31 ENCOUNTER — Other Ambulatory Visit: Payer: Self-pay

## 2018-03-31 MED ORDER — LIDOCAINE-PRILOCAINE 2.5-2.5 % EX CREA: 1.0000 "application " | TOPICAL_CREAM | CUTANEOUS | 0 refills | Status: DC | PRN

## 2018-03-31 NOTE — Telephone Encounter (Signed)
Graciella Belton, per Dr. Alvy Bimler for complaint of painful port site. Pls send in prescription for emla cream. I recommend ice over the port plus scheduled tylenol 500 mg three times a day for next 3 days. [name] verbalized understanding. Rx sent to pharmacy.

## 2018-04-01 ENCOUNTER — Telehealth: Payer: Self-pay

## 2018-04-01 ENCOUNTER — Telehealth: Payer: Self-pay | Admitting: Oncology

## 2018-04-01 NOTE — Telephone Encounter (Signed)
Called Jo Parker and gave him central scheduling's number to schedule CT scan for 04/07/2018.  Advised him that he may be able to schedule it for [facility] which would be closer for them.  Also advised him that she can pick up the contrast at her appointment with Dr. Alvy Bimler on Tuesday, 04/05/2018.

## 2018-04-01 NOTE — Telephone Encounter (Signed)
[facility] called and left a message requesting speech therapy orders for 2 x a week for 4 weeks.  Called back and given verbal order.

## 2018-04-05 ENCOUNTER — Telehealth: Payer: Self-pay | Admitting: Hematology and Oncology

## 2018-04-05 ENCOUNTER — Encounter: Payer: Self-pay | Admitting: Hematology and Oncology

## 2018-04-05 ENCOUNTER — Telehealth: Payer: Self-pay

## 2018-04-05 ENCOUNTER — Inpatient Hospital Stay: Payer: Medicare PPO

## 2018-04-05 ENCOUNTER — Inpatient Hospital Stay (HOSPITAL_BASED_OUTPATIENT_CLINIC_OR_DEPARTMENT_OTHER): Payer: Medicare PPO | Admitting: Hematology and Oncology

## 2018-04-05 ENCOUNTER — Telehealth: Payer: Self-pay | Admitting: Oncology

## 2018-04-05 VITALS — BP 157/87 | HR 107 | Temp 98.8°F | Resp 18 | Ht 63.0 in | Wt 109.4 lb

## 2018-04-05 DIAGNOSIS — C482 Malignant neoplasm of peritoneum, unspecified: Secondary | ICD-10-CM

## 2018-04-05 DIAGNOSIS — D509 Iron deficiency anemia, unspecified: Secondary | ICD-10-CM

## 2018-04-05 DIAGNOSIS — Z299 Encounter for prophylactic measures, unspecified: Secondary | ICD-10-CM | POA: Insufficient documentation

## 2018-04-05 DIAGNOSIS — E43 Unspecified severe protein-calorie malnutrition: Secondary | ICD-10-CM

## 2018-04-05 DIAGNOSIS — T451X5A Adverse effect of antineoplastic and immunosuppressive drugs, initial encounter: Principal | ICD-10-CM

## 2018-04-05 DIAGNOSIS — D6481 Anemia due to antineoplastic chemotherapy: Secondary | ICD-10-CM

## 2018-04-05 DIAGNOSIS — I10 Essential (primary) hypertension: Secondary | ICD-10-CM

## 2018-04-05 LAB — IRON AND TIBC
Iron: 30 ug/dL — ABNORMAL LOW (ref 41–142)
Saturation Ratios: 23 % (ref 21–57)
TIBC: 130 ug/dL — ABNORMAL LOW (ref 236–444)
UIBC: 100 ug/dL — ABNORMAL LOW (ref 120–384)

## 2018-04-05 LAB — CBC WITH DIFFERENTIAL (CANCER CENTER ONLY)
Abs Immature Granulocytes: 0.03 10*3/uL (ref 0.00–0.07)
Basophils Absolute: 0 10*3/uL (ref 0.0–0.1)
Basophils Relative: 0 %
EOS ABS: 0.2 10*3/uL (ref 0.0–0.5)
EOS PCT: 3 %
HEMATOCRIT: 29.7 % — AB (ref 36.0–46.0)
Hemoglobin: 9.2 g/dL — ABNORMAL LOW (ref 12.0–15.0)
IMMATURE GRANULOCYTES: 0 %
LYMPHS ABS: 2.7 10*3/uL (ref 0.7–4.0)
Lymphocytes Relative: 36 %
MCH: 22.3 pg — ABNORMAL LOW (ref 26.0–34.0)
MCHC: 31 g/dL (ref 30.0–36.0)
MCV: 72.1 fL — AB (ref 80.0–100.0)
MONOS PCT: 8 %
Monocytes Absolute: 0.6 10*3/uL (ref 0.1–1.0)
Neutro Abs: 3.9 10*3/uL (ref 1.7–7.7)
Neutrophils Relative %: 53 %
Platelet Count: 340 10*3/uL (ref 150–400)
RBC: 4.12 MIL/uL (ref 3.87–5.11)
RDW: 18 % — AB (ref 11.5–15.5)
WBC Count: 7.4 10*3/uL (ref 4.0–10.5)
nRBC: 0 % (ref 0.0–0.2)

## 2018-04-05 LAB — CMP (CANCER CENTER ONLY)
ALT: 17 U/L (ref 0–44)
AST: 30 U/L (ref 15–41)
Albumin: 2.6 g/dL — ABNORMAL LOW (ref 3.5–5.0)
Alkaline Phosphatase: 92 U/L (ref 38–126)
Anion gap: 8 (ref 5–15)
BILIRUBIN TOTAL: 0.4 mg/dL (ref 0.3–1.2)
BUN: 8 mg/dL (ref 8–23)
CO2: 31 mmol/L (ref 22–32)
CREATININE: 0.74 mg/dL (ref 0.44–1.00)
Calcium: 9.1 mg/dL (ref 8.9–10.3)
Chloride: 102 mmol/L (ref 98–111)
GFR, Est AFR Am: >60 mL/min (ref >60)
Glucose, Bld: 101 mg/dL — ABNORMAL HIGH (ref 70–99)
POTASSIUM: 3.3 mmol/L — AB (ref 3.5–5.1)
Sodium: 141 mmol/L (ref 135–145)
TOTAL PROTEIN: 8.5 g/dL — AB (ref 6.5–8.1)

## 2018-04-05 LAB — SEDIMENTATION RATE: SED RATE: 113 mm/h — AB (ref 0–22)

## 2018-04-05 LAB — MAGNESIUM: Magnesium: 1.1 mg/dL — CL (ref 1.7–2.4)

## 2018-04-05 LAB — FERRITIN: FERRITIN: 848 ng/mL — AB (ref 11–307)

## 2018-04-05 MED ORDER — HEPARIN SOD (PORK) LOCK FLUSH 100 UNIT/ML IV SOLN
500.0000 [IU] | Freq: Once | INTRAVENOUS | Status: AC
  Administered 2018-04-05: 500 [IU]
  Filled 2018-04-05: qty 5

## 2018-04-05 MED ORDER — SODIUM CHLORIDE 0.9% FLUSH
10.0000 mL | Freq: Once | INTRAVENOUS | Status: AC
  Administered 2018-04-05: 10 mL
  Filled 2018-04-05: qty 10

## 2018-04-05 MED ORDER — INFLUENZA VAC SPLIT QUAD 0.5 ML IM SUSY
0.5000 mL | PREFILLED_SYRINGE | Freq: Once | INTRAMUSCULAR | Status: AC
  Administered 2018-04-05: 0.5 mL via INTRAMUSCULAR

## 2018-04-05 NOTE — Progress Notes (Signed)
[place] OFFICE PROGRESS NOTE  Patient Care Team: [name], MD as PCP - General (Hematology and Oncology)  ASSESSMENT & PLAN:  Primary peritoneal carcinomatosis [facility]) She has a very prolonged course of hospitalization due to postoperative complications She is recovering well at home We will continue aggressive supportive care She has imaging studies ordered with follow-up appointment to see GYN oncologist I plan to see her back in December for further follow-up  Iron deficiency anemia She has multifactorial anemia. She is not symptomatic.  Iron studies are pending  White coat syndrome with hypertension Her blood pressure is mildly elevated but remained within normal limits at home I recommend discontinuation of a lot of nonessential medications and clonidine I have advised the son to check blood pressure at home  Severe protein-calorie malnutrition ([place]) She is thin and cachectic but is overall gaining weight She will continue IV fluid support at home along with increase oral intake as tolerated Her leg edema is due to third spacing and protein calorie malnutrition I recommend elastic compression hose to use as needed  Preventive measure We discussed the importance of preventive care and reviewed the vaccination programs. She does not have any prior allergic reactions to influenza vaccination. She agrees to proceed with influenza vaccination today and we will administer it today at the clinic. I also plan to discontinue a lot of unnecessary medication and will initiate pain medicine taper  Hypomagnesemia This has been a recurrent issue I recommend IV magnesium replacement therapy and recheck next week This is contributing to mild hypokalemia   No orders of the defined types were placed in this encounter.   INTERVAL HISTORY: Please see below for problem oriented charting. She returns with her son for further follow-up She feels stronger She is eating  better IV fluids has helped She complained of minimal leg swelling No recent nausea, vomiting or changes in bowel habits She has a very minimum pain Denies recent fever or chills. The patient denies any recent signs or symptoms of bleeding such as spontaneous epistaxis, hematuria or hematochezia.   SUMMARY OF ONCOLOGIC HISTORY: Oncology History   BRCA2 positive Primary peritoneal, high grade serous     Primary peritoneal carcinomatosis ([place])   08/18/2017 Imaging    US imaging showed scular AAA measured 3.8 cm, liver nodularity with ascites and pleural effusions    09/03/2017 Imaging    CT chest elsewhere showed small bilateral pleural effusion    09/13/2017 Pathology Results    Fluid cytology positive for adenocarcinoma, strongly positive for CK7, CA-125, moderate to strong MOC-31 and B72.3. CK20, CEA and Ca19-9 were negative    09/16/2017 Imaging    CT abdomen and pelvis elsewhere showed small bilateral pleural effusion, cardiomegaly, 2.6 cm ovoid cystic mass at the pancreatic tail, 3.5 cm saccular aneurysm, no retroperitoneal lymphadenopathy, marked sigmoid wall thickening without overt mechanical bowel obstruction, marked omental thickening and ascites.    10/07/2017 Genetic Testing    BRCA2 c.5073dupA (p.Trp1692Metfs*3) pathogenic mutation and APC c.7088A>C (p.Lys2363Arg), POLE c.1007A>G (p.Asn36Ser) and RAD51D c.434G>A (p.Arg145His) VUS identified the [facility] panel.  The [facility] gene panel offered by [employer] includes sequencing and deletion/duplication testing of the following 35 genes: APC, ATM, AXIN2, BARD1, BMPR1A, BRCA1, BRCA2, BRIP1, CHD1, CDK4, CDKN2A, CHEK2, EPCAM (large rearrangement only), HOXB13, (sequencing only), GALNT12, MLH1, MSH2, MSH3 (excluding repetitive portions of exon 1), MSH6, MUTYH, NBN, NTHL1, PALB2, PMS2, PTEN, RAD51C, RAD51D, RNF43, RPS20, SMAD4, STK11, and TP53. Sequencing was performed for select regions of  POLE and POLD1, and large  rearrangement analysis was performed for select regions of GREM1. The report date is Oct 07, 2017.    10/08/2017 Procedure    Extensive omental and peritoneal tumor is present anteriorly deep to the abdominal wall. From a midline approach, tumor was sampled just to the left of midline in the upper pelvis, below the level of the umbilicus. Solid tissue was obtained.  IMPRESSION: CT-guided core biopsy performed of omental/peritoneal tumor in the anterior peritoneal cavity.    10/08/2017 Pathology Results    Soft Tissue Needle Core Biopsy, Omentum/Peritoneal Cavity - POORLY DIFFERENTIATED CARCINOMA. Microscopic Comment Immunohistochemistry will be performed and reported as an addendum. Dr. [name] agrees. Called to [employer] on 10/11/17. (JDP:kh 10/11/17) ADDENDUM: Immunohistochemistry shows the tumor is positive with cytokeratin AE1/AE3, cytokeratin 7, estrogen receptor, WT-1, and p16. The tumor is negative with CD56, chromogranin, synaptophysin, CDX-2, cytokeratin 20, progesterone receptor, GATA-3, GCDFP, Napsin-A and thyroid transcription factor-1. 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 The catheter is ready for use.    10/14/2017 Procedure    A total of approximately 1300 mL of ascitic fluid was removed.    10/15/2017 Tumor Marker    Patient's tumor was tested for the following markers: CA-125 Results of the tumor marker test revealed 775.9    10/18/2017 -  Chemotherapy    The patient had carboplatin and Taxol    10/22/2017 - 10/27/2017 Hospital Admission    She was admitted to the hospital for evaluation of shortness  of breath and was found to have large pleural effusion, successfully removed and cytology was malignant.  Sepsis was ruled out.    10/23/2017 Imaging    No evidence of pulmonary embolism.  New moderate size left effusion and new small to moderate right effusion with associated bibasilar atelectasis.  Known extensive peritoneal carcinomatosis and moderate ascites.  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 Recommend correlation with prior exams and vascular surgery consultation.  2.1 cm left renal cyst.  Mild cardiomegaly and minimal atherosclerotic coronary artery disease.  Aortic Atherosclerosis (ICD10-I70.0) and Emphysema (ICD10-J43.9).    10/24/2017 Pathology Results    PLEURAL FLUID, LEFT (SPECIMEN 1 OF 1 COLLECTED 10/24/17): MALIGNANT CELLS CONSISTENT WITH METASTATIC ADENOCARCINOMA. SEE COMMENT.    10/24/2017 Procedure    Successful ultrasound guided left thoracentesis yielding 580 mL of pleural fluid.  Follow-up chest radiograph shows no pneumothorax    11/03/2017 Tumor Marker    Patient's tumor was tested for the following markers: CA-125 Results of the tumor marker test revealed 991.9    11/29/2017 Tumor Marker    Patient's tumor was tested for the following markers: CA-125 Results of the tumor marker test revealed 133    12/14/2017 Imaging    Decreased peritoneal carcinomatosis, and near complete resolution of ascites since prior exam.  No new or progressive disease within the abdomen or pelvis.  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  Skeet Latch, M.D. 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  Miliary disease of the bilateral diaphragmatic surfaces and also the mesentery of the large and small bowel.  Left adnexa densely adherent to the distal rectosigmoid colon.  Right adnexa adherent to the pelvic sidewall.  Multiple loops of bowel adherent to miliary disease in the vaginal cuff.  At the completion of procedure the patient was R1 disease status     01/27/2018 Pathology Results    1. Omentum, resection for tumor, infragastric - HIGH GRADE SEROUS CARCINOMA, >2 CM. - SEE ONCOLOGY TABLE. 2. Soft tissue mass, simple excision, abdominal wall nodule - HIGH GRADE SEROUS CARCINOMA, >2 CM. - SEE ONCOLOGY TABLE. 3. Ovary and fallopian tube, left - OVARY AND FALLOPIAN TUBE WITH SURFACE SEROUS CARCINOMA. 4. Ovary and fallopian tube, right - OVARY AND FALLOPIAN TUBE WITH SURFACE SEROUS CARCINOMA. Microscopic Comment 1. OVARY or FALLOPIAN TUBE or PRIMARY PERITONEUM: Procedure: Omentectomy, soft tissue excision, bilateral salpingo-oophorectomy. Specimen Integrity: Intact. Tumor Site: See comment. Ovarian Surface Involvement (required only if applicable): Present, bilateral. Fallopian Tube Surface Involvement (required only if applicable): Present, bilateral. Tumor Size: >2 cm, see comment. Histologic Type: High grade serous carcinoma. Histologic Grade: High grade. Implants  (required for advanced stage serous/seromucinous borderline tumors only): See comment. Other Tissue/ Organ Involvement: Bilateral fallopian tubes, bilateral ovaries, omentum, peritoneum. Largest Extrapelvic Peritoneal Focus (required only if applicable): >2 cm. Peritoneal/Ascitic Fluid: N/A. Pleural effusion ([id number]) positive. Treatment Effect (required only for high-grade serous carcinomas): CRS-1 Regional Lymph Nodes: No lymph nodes submitted or found Pathologic Stage Classification (pTNM, AJCC 8th Edition): ypT3c, ypNX, pM1a Representative Tumor Block: 1A-B Comment(s): The omentum is largely infiltrated by tumor (>2 cm). Both ovaries and fallopian tubes were entirely submitted. A distinct precursor lesion is not identified, only surface deposits. While it is not recommended to diagnose a primary peritoneal carcinoma after treatment, it is assumed the likely primary given the lack of precursor lesion or significant disease in the ovaries and fallopian tubes.    01/27/2018 - 03/23/2018 Hospital Admission    She was admitted to the hospital for interval debulking surgery, complicated by sepsis and respiratory failure.    03/08/2018 Imaging    7.0 x 1.3 cm left pelvic fluid collection is noted with air-fluid level and enhancing margins, most consistent with abscess. It is slightly smaller compared to prior exam.  Stable position of surgical drainage catheter is seen in the pelvis.  Sigmoid diverticulosis is noted without inflammation.  3.9 cm aortic aneurysm is noted at aortic hiatus. Recommend followup by [name] in 2 years. This recommendation follows ACR consensus guidelines: White Paper of the ACR Incidental Findings Committee II on Vascular Findings. Joellyn Rued Radiol 2013; 10:789-794.     03/29/2018 Tumor Marker    Patient's tumor was tested for the following markers: CA-125 Results of the tumor  marker test revealed 35.3     REVIEW OF SYSTEMS:   Constitutional: Denies fevers, chills  or abnormal weight loss Eyes: Denies blurriness of vision Ears, nose, mouth, throat, and face: Denies mucositis or sore throat Respiratory: Denies cough, dyspnea or wheezes Cardiovascular: Denies palpitation, chest discomfort  Gastrointestinal:  Denies nausea, heartburn or change in bowel habits Skin: Denies abnormal skin rashes Lymphatics: Denies new lymphadenopathy or easy bruising Neurological:Denies numbness, tingling or new weaknesses Behavioral/Psych: Mood is stable, no new changes  All other systems were reviewed with the patient and are negative.  I have reviewed the past medical history, past surgical history, social history and family history with the patient and they are unchanged from previous note.  ALLERGIES:  is allergic to oxycodone.  MEDICATIONS:  Current Outpatient Medications  Medication Sig Dispense Refill  . acetaminophen (TYLENOL) 325 MG tablet Take 2 tablets (650 mg total) by mouth every 6 (six) hours as needed for mild pain, fever or headache.    . enalapril (VASOTEC) 5 MG tablet Take 1 tablet by mouth daily.  0  . famotidine (PEPCID) 20 MG tablet TK 1 T PO BID  0  . lidocaine-prilocaine (EMLA) cream Apply 1 application topically as needed. 30 g 0  . MAGNESIUM-OXIDE 400 (241.3 Mg) MG tablet TK 2 TS PO BID X 3 DAYS  0  . metoprolol succinate (TOPROL-XL) 25 MG 24 hr tablet TK 1 T PO BID  0  . mirtazapine (REMERON) 15 MG tablet TK 1 T PO QHS  0   No current facility-administered medications for this visit.     PHYSICAL EXAMINATION: ECOG PERFORMANCE STATUS: 2 - Symptomatic, <50% confined to bed  Vitals:   04/05/18 1409  BP: (!) 157/87  Pulse: (!) 107  Resp: 18  Temp: 98.8 F (37.1 C)  SpO2: 95%   Filed Weights   04/05/18 1409  Weight: 109 lb 6.4 oz (49.6 kg)    GENERAL:alert, no distress and comfortable.  She looks thin and cachectic SKIN: skin color, texture, turgor are normal, no rashes or significant lesions EYES: normal, Conjunctiva are pink  and non-injected, sclera clear OROPHARYNX:no exudate, no erythema and lips, buccal mucosa, and tongue normal  NECK: supple, thyroid normal size, non-tender, without nodularity LYMPH:  no palpable lymphadenopathy in the cervical, axillary or inguinal LUNGS: clear to auscultation and percussion with normal breathing effort HEART: regular rate & rhythm and no murmurs with mild bilateral lower extremity edema ABDOMEN:abdomen soft, non-tender and normal bowel sounds Musculoskeletal:no cyanosis of digits and no clubbing  NEURO: alert & oriented x 3 with fluent speech, no focal motor/sensory deficits  LABORATORY DATA:  I have reviewed the data as listed    Component Value Date/Time   NA 141 04/05/2018 1355   K 3.3 (L) 04/05/2018 1355   CL 102 04/05/2018 1355   CO2 31 04/05/2018 1355   GLUCOSE 101 (H) 04/05/2018 1355   BUN 8 04/05/2018 1355   CREATININE 0.74 04/05/2018 1355   CALCIUM 9.1 04/05/2018 1355   PROT 8.5 (H) 04/05/2018 1355   ALBUMIN 2.6 (L) 04/05/2018 1355   AST 30 04/05/2018 1355   ALT 17 04/05/2018 1355   ALKPHOS 92 04/05/2018 1355   BILITOT 0.4 04/05/2018 1355   GFRNONAA >60 04/05/2018 1355   GFRAA >60 04/05/2018 1355    No results found for: SPEP, UPEP  Lab Results  Component Value Date   WBC 7.4 04/05/2018   NEUTROABS 3.9 04/05/2018   HGB 9.2 (L) 04/05/2018   HCT  29.7 (L) 04/05/2018   MCV 72.1 (L) 04/05/2018   PLT 340 04/05/2018      Chemistry      Component Value Date/Time   NA 141 04/05/2018 1355   K 3.3 (L) 04/05/2018 1355   CL 102 04/05/2018 1355   CO2 31 04/05/2018 1355   BUN 8 04/05/2018 1355   CREATININE 0.74 04/05/2018 1355      Component Value Date/Time   CALCIUM 9.1 04/05/2018 1355   ALKPHOS 92 04/05/2018 1355   AST 30 04/05/2018 1355   ALT 17 04/05/2018 1355   BILITOT 0.4 04/05/2018 1355       RADIOGRAPHIC STUDIES: I have personally reviewed the radiological images as listed and agreed with the findings in the report. Ct Abdomen  Pelvis W Contrast  Result Date: 03/08/2018 CLINICAL DATA:  Status post drainage of abdominal abscess. EXAM: CT ABDOMEN AND PELVIS WITH CONTRAST TECHNIQUE: Multidetector CT imaging of the abdomen and pelvis was performed using the standard protocol following bolus administration of intravenous contrast. CONTRAST:  75 mL ISOVUE-300 IOPAMIDOL (ISOVUE-300) INJECTION 61% COMPARISON:  CT scan of February 23, 2018. FINDINGS: Lower chest: Minimal right posterior basilar subsegmental atelectasis is noted. Hepatobiliary: No focal liver abnormality is seen. No gallstones, gallbladder wall thickening, or biliary dilatation. Pancreas: Unremarkable. No pancreatic ductal dilatation or surrounding inflammatory changes. Spleen: Normal in size without focal abnormality. Adrenals/Urinary Tract: Adrenal glands appear normal. No hydronephrosis or renal obstruction is noted. Left renal cyst is noted. Urinary bladder is unremarkable. Stomach/Bowel: The stomach appears normal. There is no evidence of bowel obstruction or inflammation. Rectal tube is noted. Sigmoid diverticulosis is noted without inflammation. The appendix is not visualized. Vascular/Lymphatic: 3.9 cm aortic aneurysm is noted at aortic hiatus. Atherosclerosis of abdominal aorta is noted. No adenopathy is noted. Reproductive: Status post hysterectomy. No adnexal masses. Other: 7.0 x 1.3 cm left pelvic fluid collection is noted with air-fluid level and enhancing margins, most consistent with abscess. This is slightly smaller compared to prior exam. Stable position of surgical drainage catheter seen in the pelvis. Musculoskeletal: No acute or significant osseous findings. IMPRESSION: 7.0 x 1.3 cm left pelvic fluid collection is noted with air-fluid level and enhancing margins, most consistent with abscess. It is slightly smaller compared to prior exam. Stable position of surgical drainage catheter is seen in the pelvis. Sigmoid diverticulosis is noted without inflammation.  3.9 cm aortic aneurysm is noted at aortic hiatus. Recommend followup by [name] in 2 years. This recommendation follows ACR consensus guidelines: White Paper of the ACR Incidental Findings Committee II on Vascular Findings. J Am Coll Radiol 2013; 10:789-794. Electronically Signed   By: Marijo Conception, M.D.   On: 03/08/2018 13:17   Dg Chest Port 1 View  Result Date: 03/12/2018 CLINICAL DATA:  Bilateral aspiration pneumonia. EXAM: PORTABLE CHEST 1 VIEW COMPARISON:  03/05/2018. FINDINGS: Stable borderline enlarged cardiac silhouette and right jugular porta catheter. The tracheostomy tube has been removed. The left PICC tip is in the superior vena cava proximally 2 cm above the superior cavoatrial junction. No significant change in patchy opacity in the right upper lung zone and small amount of linear atelectasis or scarring in the left mid to lower lung zone. The overall lung volumes remain hyperexpanded with stable mild prominence of the interstitial markings. No pleural fluid. Unremarkable bones. Probable loop recorder overlying the left heart. IMPRESSION: 1. Stable right upper lung pneumonia. 2. Stable mild changes of COPD. Electronically Signed   By: Claudie Revering M.D.   On:  03/12/2018 08:48   Dg Swallowing Func-speech Pathology  Result Date: 03/22/2018 Please refer to "Notes" tab for Speech Pathology notes.   All questions were answered. The patient knows to call the clinic with any problems, questions or concerns. No barriers to learning was detected.  I spent 25 minutes counseling the patient face to face. The total time spent in the appointment was 30 minutes and more than 50% was on counseling and review of test results  [name], MD 04/05/2018 4:07 PM

## 2018-04-05 NOTE — Assessment & Plan Note (Signed)
Her blood pressure is mildly elevated but remained within normal limits at home I recommend discontinuation of a lot of nonessential medications and clonidine I have advised the son to check blood pressure at home

## 2018-04-05 NOTE — Assessment & Plan Note (Addendum)
We discussed the importance of preventive care and reviewed the vaccination programs. She does not have any prior allergic reactions to influenza vaccination. She agrees to proceed with influenza vaccination today and we will administer it today at the clinic. I also plan to discontinue a lot of unnecessary medication and will initiate pain medicine taper

## 2018-04-05 NOTE — Assessment & Plan Note (Addendum)
She is thin and cachectic but is overall gaining weight She will continue IV fluid support at home along with increase oral intake as tolerated Her leg edema is due to third spacing and protein calorie malnutrition I recommend elastic compression hose to use as needed

## 2018-04-05 NOTE — Telephone Encounter (Signed)
Graciella Belton with appointment for CT on 04/07/18 at 1 pm at [facility].  Met him in the cancer center lobby and gave him contrast and instructions for the scan.

## 2018-04-05 NOTE — Telephone Encounter (Signed)
Called per Dr. Alvy Bimler and given Magnesium results. [facility] will add 2 g IV Magnesium daily for x 1 week. Scheduling message sent for 11/7 lab prior to MD appt. He verbalized understanding.

## 2018-04-05 NOTE — Telephone Encounter (Signed)
Gave patient avs and calendar.   °

## 2018-04-05 NOTE — Assessment & Plan Note (Signed)
She has multifactorial anemia. She is not symptomatic.  Iron studies are pending

## 2018-04-05 NOTE — Assessment & Plan Note (Signed)
She has a very prolonged course of hospitalization due to postoperative complications She is recovering well at home We will continue aggressive supportive care She has imaging studies ordered with follow-up appointment to see GYN oncologist I plan to see her back in December for further follow-up

## 2018-04-05 NOTE — Assessment & Plan Note (Signed)
This has been a recurrent issue I recommend IV magnesium replacement therapy and recheck next week This is contributing to mild hypokalemia

## 2018-04-06 ENCOUNTER — Telehealth: Payer: Self-pay | Admitting: Hematology and Oncology

## 2018-04-06 NOTE — Telephone Encounter (Signed)
Scheduled appt per 10/29 sch message - pt is aware of appt date and time   

## 2018-04-07 ENCOUNTER — Other Ambulatory Visit: Payer: Medicare PPO

## 2018-04-07 ENCOUNTER — Ambulatory Visit (HOSPITAL_COMMUNITY)
Admission: RE | Admit: 2018-04-07 | Discharge: 2018-04-07 | Disposition: A | Discharge status: Home / Self Care | Payer: Medicare PPO | Source: Ambulatory Visit | Attending: Gynecologic Oncology | Admitting: Gynecologic Oncology

## 2018-04-07 ENCOUNTER — Encounter (HOSPITAL_COMMUNITY): Payer: Self-pay

## 2018-04-07 DIAGNOSIS — C569 Malignant neoplasm of unspecified ovary: Secondary | ICD-10-CM | POA: Diagnosis not present

## 2018-04-07 DIAGNOSIS — R935 Abnormal findings on diagnostic imaging of other abdominal regions, including retroperitoneum: Secondary | ICD-10-CM | POA: Insufficient documentation

## 2018-04-07 DIAGNOSIS — Z90710 Acquired absence of both cervix and uterus: Secondary | ICD-10-CM | POA: Insufficient documentation

## 2018-04-07 DIAGNOSIS — K5730 Diverticulosis of large intestine without perforation or abscess without bleeding: Secondary | ICD-10-CM | POA: Insufficient documentation

## 2018-04-07 DIAGNOSIS — Z9889 Other specified postprocedural states: Secondary | ICD-10-CM | POA: Diagnosis not present

## 2018-04-07 DIAGNOSIS — N739 Female pelvic inflammatory disease, unspecified: Secondary | ICD-10-CM | POA: Diagnosis not present

## 2018-04-07 DIAGNOSIS — I712 Thoracic aortic aneurysm, without rupture: Secondary | ICD-10-CM | POA: Diagnosis not present

## 2018-04-07 MED ORDER — HEPARIN SOD (PORK) LOCK FLUSH 100 UNIT/ML IV SOLN: 500.0000 [IU] | Freq: Once | INTRAVENOUS | Status: DC

## 2018-04-07 MED ORDER — SODIUM CHLORIDE 0.9 % IJ SOLN
INTRAMUSCULAR | Status: AC
  Filled 2018-04-07: qty 50

## 2018-04-07 MED ORDER — IOHEXOL 300 MG/ML  SOLN
100.0000 mL | Freq: Once | INTRAMUSCULAR | Status: AC | PRN
  Administered 2018-04-07: 100 mL via INTRAVENOUS

## 2018-04-07 MED ORDER — HEPARIN SOD (PORK) LOCK FLUSH 100 UNIT/ML IV SOLN
INTRAVENOUS | Status: AC
  Filled 2018-04-07: qty 5

## 2018-04-11 ENCOUNTER — Telehealth: Payer: Self-pay | Admitting: Oncology

## 2018-04-11 NOTE — Telephone Encounter (Signed)
[name] left a message asking whether Jo Parker's appointment with Dr. Skeet Latch on 04/14/18 is at [facility] or [facility].  Called him back and left a message advising him that the appointment is at [facility].

## 2018-04-14 ENCOUNTER — Inpatient Hospital Stay: Payer: Medicare PPO

## 2018-04-14 ENCOUNTER — Encounter: Payer: Self-pay | Admitting: Gynecologic Oncology

## 2018-04-14 ENCOUNTER — Inpatient Hospital Stay: Payer: Medicare PPO | Attending: Hematology and Oncology | Admitting: Gynecologic Oncology

## 2018-04-14 ENCOUNTER — Telehealth: Payer: Self-pay | Admitting: Hematology and Oncology

## 2018-04-14 ENCOUNTER — Telehealth: Payer: Self-pay

## 2018-04-14 ENCOUNTER — Other Ambulatory Visit: Payer: Self-pay

## 2018-04-14 VITALS — BP 140/90 | HR 120 | Temp 98.8°F | Resp 18 | Ht 63.0 in | Wt 104.2 lb

## 2018-04-14 DIAGNOSIS — C786 Secondary malignant neoplasm of retroperitoneum and peritoneum: Secondary | ICD-10-CM

## 2018-04-14 DIAGNOSIS — R188 Other ascites: Secondary | ICD-10-CM | POA: Diagnosis not present

## 2018-04-14 DIAGNOSIS — C569 Malignant neoplasm of unspecified ovary: Secondary | ICD-10-CM

## 2018-04-14 DIAGNOSIS — Z9221 Personal history of antineoplastic chemotherapy: Secondary | ICD-10-CM

## 2018-04-14 DIAGNOSIS — Z90710 Acquired absence of both cervix and uterus: Secondary | ICD-10-CM

## 2018-04-14 DIAGNOSIS — C482 Malignant neoplasm of peritoneum, unspecified: Secondary | ICD-10-CM

## 2018-04-14 DIAGNOSIS — Z90722 Acquired absence of ovaries, bilateral: Secondary | ICD-10-CM | POA: Diagnosis not present

## 2018-04-14 DIAGNOSIS — K5900 Constipation, unspecified: Secondary | ICD-10-CM

## 2018-04-14 LAB — CMP (CANCER CENTER ONLY)
ALT: 15 U/L (ref 0–44)
AST: 28 U/L (ref 15–41)
Albumin: 2.9 g/dL — ABNORMAL LOW (ref 3.5–5.0)
Alkaline Phosphatase: 83 U/L (ref 38–126)
Anion gap: 11 (ref 5–15)
BUN: 9 mg/dL (ref 8–23)
CHLORIDE: 102 mmol/L (ref 98–111)
CO2: 27 mmol/L (ref 22–32)
Calcium: 9.5 mg/dL (ref 8.9–10.3)
Creatinine: 0.71 mg/dL (ref 0.44–1.00)
GFR, Est AFR Am: >60 mL/min (ref >60)
Glucose, Bld: 100 mg/dL — ABNORMAL HIGH (ref 70–99)
POTASSIUM: 2.8 mmol/L — AB (ref 3.5–5.1)
SODIUM: 140 mmol/L (ref 135–145)
Total Bilirubin: 0.4 mg/dL (ref 0.3–1.2)
Total Protein: 8.9 g/dL — ABNORMAL HIGH (ref 6.5–8.1)

## 2018-04-14 LAB — CBC WITH DIFFERENTIAL (CANCER CENTER ONLY)
Abs Immature Granulocytes: 0.03 10*3/uL (ref 0.00–0.07)
BASOS ABS: 0 10*3/uL (ref 0.0–0.1)
BASOS PCT: 0 %
EOS ABS: 0.1 10*3/uL (ref 0.0–0.5)
Eosinophils Relative: 2 %
HCT: 30.2 % — ABNORMAL LOW (ref 36.0–46.0)
Hemoglobin: 9.2 g/dL — ABNORMAL LOW (ref 12.0–15.0)
IMMATURE GRANULOCYTES: 0 %
Lymphocytes Relative: 38 %
Lymphs Abs: 2.6 10*3/uL (ref 0.7–4.0)
MCH: 21.5 pg — ABNORMAL LOW (ref 26.0–34.0)
MCHC: 30.5 g/dL (ref 30.0–36.0)
MCV: 70.7 fL — AB (ref 80.0–100.0)
Monocytes Absolute: 0.6 10*3/uL (ref 0.1–1.0)
Monocytes Relative: 9 %
NEUTROS ABS: 3.4 10*3/uL (ref 1.7–7.7)
NEUTROS PCT: 51 %
NRBC: 0 % (ref 0.0–0.2)
PLATELETS: 336 10*3/uL (ref 150–400)
RBC: 4.27 MIL/uL (ref 3.87–5.11)
RDW: 17.3 % — AB (ref 11.5–15.5)
WBC: 6.7 10*3/uL (ref 4.0–10.5)

## 2018-04-14 LAB — MAGNESIUM: MAGNESIUM: 1.5 mg/dL — AB (ref 1.7–2.4)

## 2018-04-14 MED ORDER — SODIUM CHLORIDE 0.9% FLUSH
10.0000 mL | Freq: Once | INTRAVENOUS | Status: AC
  Administered 2018-04-14: 10 mL
  Filled 2018-04-14: qty 10

## 2018-04-14 MED ORDER — HEPARIN SOD (PORK) LOCK FLUSH 100 UNIT/ML IV SOLN
500.0000 [IU] | Freq: Once | INTRAVENOUS | Status: DC
  Filled 2018-04-14: qty 5

## 2018-04-14 NOTE — Telephone Encounter (Signed)
Gave patient son appointment calendar.

## 2018-04-14 NOTE — Progress Notes (Signed)
GYN ONCOLOGY OFFICE VISIT  Referring Clinician:  Dr. Kayleen Memos  CC:  Chief Complaint  Patient presents with  . Malignant neoplasm of ovary, unspecified laterality ([place])  Pelvic abscess  HPI: [name]  is a very nice 72 y.o.  P3  March 2019 she had a abdominal ultrasound for her history of aortic aneurysm there was an incidental finding of a cirrhotic appearing liver and ascites.  Initially treated with diuretics but after lack of improvement further work-up was performed including a CT scan of the abdomen and pelvis.  As noted below etiology was significant carcinomatosis with peritoneal thickening sigmoid colon with significant thickening new pancreatic cyst compared to a 2018 scan and omental caking.  Seen by medical oncology [place] which is where she lives.  Paracentesis was ordered along with tumor markers including CA 125 which was noted to be elevated CA-19-9 which was mildly elevated normal being 35 the cytology on the paracentesis was positive for adenocarcinoma IHC staining suggested a GYN primary CK 7 and Ca1 25 being positive then CEA CK 20 and CA-19-9 being negative.  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 CK20, CEA and Ca19-9 were negative    09/16/2017 Imaging    CT abdomen and pelvis elsewhere showed small bilateral pleural effusion, cardiomegaly, 2.6 cm ovoid cystic mass at the pancreatic tail, 3.5 cm saccular aneurysm, no retroperitoneal lymphadenopathy, marked sigmoid wall thickening without overt mechanical bowel obstruction, marked omental thickening and ascites.    10/07/2017 Genetic Testing    BRCA2 c.5073dupA (p.Trp1692Metfs*3) pathogenic mutation and APC c.7088A>C (p.Lys2363Arg), POLE c.1007A>G (p.Asn36Ser) and RAD51D c.434G>A (p.Arg145His) VUS identified the [facility] panel.  The [facility] gene panel offered by [employer] includes sequencing and deletion/duplication testing of the following 35 genes: APC, ATM, AXIN2, BARD1, BMPR1A, BRCA1, BRCA2, BRIP1, CHD1, CDK4, CDKN2A, CHEK2, EPCAM (large rearrangement only), HOXB13, (sequencing only), GALNT12, MLH1, MSH2, MSH3 (excluding repetitive portions of exon 1), MSH6, MUTYH, NBN, NTHL1, PALB2, PMS2, PTEN, RAD51C, RAD51D, RNF43, RPS20, SMAD4, STK11, and TP53. Sequencing was performed for select regions of POLE and POLD1, and large rearrangement analysis was performed for select regions of GREM1. The report date is Oct 07, 2017.    10/08/2017 Procedure    Extensive omental and peritoneal tumor is present anteriorly deep to the abdominal wall. From a midline approach, tumor was sampled just to the left of midline in the upper pelvis, below the level of the umbilicus. Solid tissue was obtained.  IMPRESSION: CT-guided core biopsy performed of omental/peritoneal tumor in the anterior peritoneal cavity.    10/08/2017 Pathology Results    Soft Tissue Needle Core Biopsy, Omentum/Peritoneal Cavity - POORLY DIFFERENTIATED CARCINOMA. Microscopic Comment Immunohistochemistry  will be performed and reported as an addendum. Dr. [name] agrees. Called to [employer] on 10/11/17. (JDP:kh 10/11/17) ADDENDUM:  Immunohistochemistry shows the tumor is positive with cytokeratin AE1/AE3, cytokeratin 7, estrogen receptor, WT-1, and p16. The tumor is negative with CD56, chromogranin, synaptophysin, CDX-2, cytokeratin 20, progesterone receptor, GATA-3, GCDFP, Napsin-A and thyroid transcription factor-1. 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 The catheter is ready for use.    10/14/2017 Procedure    A total of approximately 1300 mL of ascitic fluid was removed.    10/15/2017 Tumor Marker    Patient's tumor was tested for the following markers: CA-125 Results of the tumor marker test revealed 775.9    10/18/2017 -  Chemotherapy    The patient had carboplatin and Taxol    10/22/2017 - 10/27/2017 Hospital Admission    She was admitted to the hospital for evaluation of shortness of breath and was found to have large pleural effusion, successfully removed and cytology was malignant.  Sepsis was ruled out.    10/23/2017 Imaging    No evidence of pulmonary embolism.  New moderate size left effusion and new small to moderate right effusion with associated bibasilar atelectasis.  Known extensive peritoneal carcinomatosis and moderate ascites.  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 Recommend correlation with prior exams and vascular surgery consultation.  2.1 cm left renal cyst.  Mild cardiomegaly and minimal atherosclerotic coronary artery disease.  Aortic Atherosclerosis (ICD10-I70.0) and Emphysema (ICD10-J43.9).    10/24/2017 Pathology Results    PLEURAL FLUID, LEFT (SPECIMEN 1 OF 1 COLLECTED  10/24/17): MALIGNANT CELLS CONSISTENT WITH METASTATIC ADENOCARCINOMA. SEE COMMENT.    10/24/2017 Procedure    Successful ultrasound guided left thoracentesis yielding 580 mL of pleural fluid.  Follow-up chest radiograph shows no pneumothorax    11/03/2017 Tumor Marker    Patient's tumor was tested for the following markers: CA-125 Results of the tumor marker test revealed 991.9    11/29/2017 Tumor Marker    Patient's tumor was tested for the following markers: CA-125 Results of the tumor marker test revealed 133    12/14/2017 Imaging    Decreased peritoneal carcinomatosis, and near complete resolution of ascites since prior exam.  No new or progressive disease within the abdomen or pelvis.  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  Skeet Latch, M.D. PhD  Indication for Procedure: Stage IV ovarian epithelial ovarian cancer status post 4 cycles neoadjuvant chemotherapy  Operative Findings: No frequent disease within the infra gastric omentum in  close approximation to the stomach invading the anterior abdominal wall with attachment to the left adnexa.  Miliary disease of the bilateral diaphragmatic surfaces and also the mesentery of the large and small bowel.  Left adnexa densely adherent to  the distal rectosigmoid colon.  Right adnexa adherent to the pelvic sidewall.  Multiple loops of bowel adherent to miliary disease in the vaginal cuff.  At the completion of procedure the patient was R1 disease status     01/27/2018 Pathology Results    1. Omentum, resection for tumor, infragastric - HIGH GRADE SEROUS CARCINOMA, >2 CM. - SEE ONCOLOGY TABLE. 2. Soft tissue mass, simple excision, abdominal wall nodule - HIGH GRADE SEROUS CARCINOMA, >2 CM. - SEE ONCOLOGY TABLE. 3. Ovary and fallopian tube, left - OVARY AND FALLOPIAN TUBE WITH SURFACE SEROUS CARCINOMA. 4. Ovary and fallopian tube, right - OVARY AND FALLOPIAN TUBE WITH SURFACE SEROUS CARCINOMA. Microscopic Comment 1. OVARY or FALLOPIAN TUBE or PRIMARY PERITONEUM: Procedure: Omentectomy, soft tissue excision, bilateral salpingo-oophorectomy. Specimen Integrity: Intact. Tumor Site: See comment. Ovarian Surface Involvement (required only if applicable): Present, bilateral. Fallopian Tube Surface Involvement (required only if applicable): Present, bilateral. Tumor Size: >2 cm, see comment. Histologic Type: High grade serous carcinoma. Histologic Grade: High grade. Implants (required for advanced stage serous/seromucinous borderline tumors only): See comment. Other Tissue/ Organ Involvement: Bilateral fallopian tubes, bilateral ovaries, omentum, peritoneum. Largest Extrapelvic Peritoneal Focus (required only if applicable): >2 cm. Peritoneal/Ascitic Fluid: N/A. Pleural effusion ([id number]) positive. Treatment Effect (required only for high-grade serous carcinomas): CRS-1 Regional Lymph Nodes: No lymph nodes submitted or found Pathologic Stage Classification (pTNM, AJCC 8th Edition): ypT3c, ypNX, pM1a Representative Tumor Block: 1A-B Comment(s): The omentum is largely infiltrated by tumor (>2 cm). Both ovaries and fallopian tubes were entirely submitted. A distinct precursor lesion is not identified, only surface deposits. While  it is not recommended to diagnose a primary peritoneal carcinoma after treatment, it is assumed the likely primary given the lack of precursor lesion or significant disease in the ovaries and fallopian tubes.    01/27/2018 - 03/23/2018 Hospital Admission    She was admitted to the hospital for interval debulking surgery, complicated by sepsis and respiratory failure.    03/08/2018 Imaging    7.0 x 1.3 cm left pelvic fluid collection is noted with air-fluid level and enhancing margins, most consistent with abscess. It is slightly smaller compared to prior exam.  Stable position of surgical drainage catheter is seen in the pelvis.  Sigmoid diverticulosis is noted without inflammation.  3.9 cm aortic aneurysm is noted at aortic hiatus. Recommend followup by [name] in 2 years. This recommendation follows ACR consensus guidelines: White Paper of the ACR Incidental Findings Committee II on Vascular Findings. Joellyn Rued Radiol 2013; 10:789-794.     03/29/2018 Tumor Marker    Patient's tumor was tested for the following markers: CA-125 Results of the tumor marker test revealed 35.3      Current Meds:  Outpatient Encounter Medications as of 04/14/2018  Medication Sig  . acetaminophen (TYLENOL) 325 MG tablet Take 2 tablets (650 mg total) by mouth every 6 (six) hours as needed for mild pain, fever or headache.  . enalapril (VASOTEC) 5 MG tablet Take 1 tablet by mouth daily.  . famotidine (PEPCID) 20 MG tablet TK 1 T PO BID  . lidocaine-prilocaine (EMLA) cream Apply 1 application topically as needed.  Marland Kitchen MAGNESIUM-OXIDE 400 (241.3 Mg) MG tablet TK 2 TS PO BID X 3 DAYS  . metoprolol succinate (TOPROL-XL) 25  MG 24 hr tablet TK 1 T PO BID  . mirtazapine (REMERON) 15 MG tablet TK 1 T PO QHS  . [DISCONTINUED] heparin lock flush 100 unit/mL    No facility-administered encounter medications on file as of 04/14/2018.     Allergy:  Allergies  Allergen Reactions  . Oxycodone     confusion    Social  Hx:   Social History   Socioeconomic History  . Marital status: Married    Spouse name: Not on file  . Number of children: 3  . Years of education: Not on file  . Highest education level: Not on file  Occupational History  . Occupation: retired  Scientific laboratory technician  . Financial resource strain: Not on file  . Food insecurity:    Worry: Not on file    Inability: Not on file  . Transportation needs:    Medical: Not on file    Non-medical: Not on file  Tobacco Use  . Smoking status: Former Smoker    Packs/day: 1.00    Years: 55.00    Pack years: 55.00    Last attempt to quit: 10/06/2017    Years since quitting: 0.5  . Smokeless tobacco: Never Used  Substance and Sexual Activity  . Alcohol use: Never    Frequency: Never  . Drug use: Never  . Sexual activity: Not Currently  Lifestyle  . Physical activity:    Days per week: Not on file    Minutes per session: Not on file  . Stress: Not on file  Relationships  . Social connections:    Talks on phone: Not on file    Gets together: Not on file    Attends religious service: Not on file    Active member of club or organization: Not on file    Attends meetings of clubs or organizations: Not on file    Relationship status: Not on file  . Intimate partner violence:    Fear of current or ex partner: Not on file    Emotionally abused: Not on file    Physically abused: Not on file    Forced sexual activity: Not on file  Other Topics Concern  . Not on file  Social History Narrative  . Not on file    Past Surgical Hx:  Past Surgical History:  Procedure Laterality Date  . ABDOMINAL HYSTERECTOMY    . cardiac monitor implant    . CEREBRAL ANEURYSM REPAIR    . CERVICAL SPINE SURGERY     bone spurs  . DEBULKING N/A 01/27/2018   Procedure: RADICAL DEBULKING;  Surgeon: [name], MD;  Location: [facility];  Service: Gynecology;  Laterality: N/A;  . IR FLUORO GUIDE PORT INSERTION RIGHT  10/14/2017  . IR PARACENTESIS  10/14/2017  . IR US  GUIDE VASC ACCESS RIGHT  10/14/2017  . LAPAROTOMY N/A 01/27/2018   Procedure: EXPLORATORY LAPAROTOMY WITH REMOVAL OF BILATERAL TUBES AND OVARIES;  Surgeon: [name], MD;  Location: [facility];  Service: Gynecology;  Laterality: N/A;  . LAPAROTOMY N/A 01/31/2018   Procedure: EXPLORATORY LAPAROTOMY, ABDOMINAL WASHOUT;  Surgeon: Isabel Caprice, MD;  Location: [facility];  Service: Gynecology;  Laterality: N/A;  . OMENTECTOMY N/A 01/27/2018   Procedure: OMENTECTOMY;  Surgeon: [name], MD;  Location: [facility];  Service: Gynecology;  Laterality: N/A;  . PARTIAL HYSTERECTOMY  1971   "precancer"    Past Medical Hx:  Past Medical History:  Diagnosis Date  . Acute on chronic respiratory failure with hypoxia (  [place]) 03/07/2018  . Anxiety and depression   . Aortic aneurysm ([place])    see ov note 03/2017   . Arthritis   . Carcinomatosis ([name]) 10/2017  . COPD (chronic obstructive pulmonary disease) ([place]) 10/23/2017  . Depression   . Family history of breast cancer   . Family history of prostate cancer   . Family history of uterine cancer   . History of diverticulitis   . HLD (hyperlipidemia) 10/23/2017  . Hypercholesterolemia   . Hypertension   . Mitral valve prolapse   . Sleep apnea    cpap machine mask and tubing in [place] per patient.  Patient does not have mask or tubing to bring on DOS of 01/27/2018.   Marland Kitchen Uterine cancer ([place])    had partial hysterectomy at 27    Past Gynecological History:   GYNECOLOGIC HISTORY:  No LMP recorded. Patient has had a hysterectomy.  For precancer Menarche: 72 years old P 3 LMP 1977 Contraceptive OCP x8 years HRT none  Last Pap N/A  Family Hx:  Family History  Problem Relation Age of Onset  . Endometrial cancer Sister 95       endometrial ca  . Endometrial cancer Paternal Aunt 80  . Breast cancer Paternal Aunt   . Stomach cancer Maternal Grandfather        d. 18s  . Prostate cancer Paternal Uncle   . Breast cancer Cousin        pat first cousin   . Breast cancer Cousin        pat first cousin's daughter  . Lung cancer Father        lung ca  . Heart disease Mother   . Stroke Paternal Grandmother   . Endometrial cancer Cousin        paternal first cousin's daughter  . Breast cancer Other        niece dx less than 17s  . Breast cancer Other        niece, dx under 45    Review of Systems:  Review of Systems  Constitutional: Positive for fatigue. Negative for appetite change.       Fair appetite  HENT:   Negative for tinnitus and trouble swallowing.   Eyes: Negative.   Respiratory: Negative for chest tightness, cough, shortness of breath and wheezing.   Gastrointestinal: Positive for constipation. Negative for abdominal distention, abdominal pain, nausea, rectal pain and vomiting.  Endocrine: Negative.   Genitourinary: Negative for pelvic pain, vaginal bleeding and vaginal discharge.   Musculoskeletal: Negative for back pain.  Skin: Negative.   Neurological: Negative.   Hematological: Negative.   Psychiatric/Behavioral: Positive for depression. Negative for confusion. The patient is not nervous/anxious.    Physical Exam: BP 140/90 (BP Location: Left Arm, Patient Position: Sitting) Comment: manual bp and pulse, notified dr Skeet Latch.   Pulse (!) 120   Temp 98.8 F (37.1 C) (Oral)   Resp 18   Ht '5\' 3"'$  (1.6 m)   Wt 104 lb 4 oz (47.3 kg)   SpO2 100%   BMI 18.47 kg/m   General :   72 y.o., female in no apparent distress HEENT:  Normocephalic/atraumatic, symmetric, EOMI, eyelids normal Neck:   Supple, no masses.  Lymphatics:  No cervical/ submandibular/ supraclavicular/ infraclavicular/ inguinal adenopathy Respiratory:  Respirations unlabored, no use of accessory muscles, chest clear to auscultation CV:   Tachycardic regular and rhythm musculoskeletal: No CVA tenderness, normal muscle strength. Abdomen:  Soft, non-tender,  No evidence of hernia.  No masses.  No ascites Extremities:  No lymphedema, no erythema,  non-tender. Skin:   Normal inspection Neuro/Psych:  No focal motor deficit, no abnormal mental status. Normal gait. Normal affect. Alert and oriented to person, place, and time  Genito Urinary: Vulva: Normal external female genitalia.  Bladder/urethra: Urethral meatus normal in size and location. No lesions or   masses, well supported bladder Bimanual exam:  Cervix/Uterus: surgically absent  Adnexa: No masses.    Assessment/Plan:: 1. 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  At initial presentation large volume ascites and positive pleural cytology was appreciated in addition to rectal wall nodularity.  Mrs. Paddock has completed 4 cycles of paclitaxel carboplatin with reduction in Ca1 25-133.  Cycle #4  December 20, 2017.  Underwent robotic assisted bilateral salpingo-oophorectomy omentectomy interval debulking on January 27, 2018.  Postop course complicated by sepsis with suspicion of perforation.  She underwent an exploratory laparotomy however the microperforation could not be identified.  Her postoperative course was very complicated however with the incredible support of the ICU team and her family and prolonged hospital course she now presents today.  A drain remains in place.  The last 2 abdominal and pelvic imaging studies have been notable for stable 7.3 x 3.0 cm cavity with air.  Imaging on April 07, 2018 is also notable mild diverticulitis.  The drain within the pelvis is not within the presumed abscess cavity. After extensive conversation with Dr. Anselm Pancoast  of interventional radiology [phone])  the plan will be for attempted drainage of this left-sided pelvic mass.  This will require overnight hospitalization.  This was discussed with the family and they are willing to proceed.  They were counseled that the reinstitution of chemotherapy will not be possible with suspected pelvic mass.   2.  Tachycardia This may been a combination of anxiety and  dehydration.  She was given fluids to drink with resolution of the tachycardia.  3.  Constipation Advised to use Metamucil and senna  [name], MD  04/14/2018, 2:44 PM  Cc: Toney Reil, MD, [place]

## 2018-04-14 NOTE — Telephone Encounter (Signed)
Reported potassium 2.8 and Magnesium of 1.5 to Dr. Maylon Peppers. Ordered obtained for 1 x order for IV Potassium 40 meq and 2 g IV Magnesium for tomorrow. Scheduling message sent for repeat labs on 11/14.  Called Pam with [facility] and given above order for tomorrow from Dr. Maylon Peppers. Then to resume daily IV fluids. She verbalized understanding.  Above message given to Ms. Gutknecht and her son's who are in the office.

## 2018-04-14 NOTE — Patient Instructions (Signed)
Implanted Port Home Guide An implanted port is a type of central line that is placed under the skin. Central lines are used to provide IV access when treatment or nutrition needs to be given through a person's veins. Implanted ports are used for long-term IV access. An implanted port may be placed because:  You need IV medicine that would be irritating to the small veins in your hands or arms.  You need long-term IV medicines, such as antibiotics.  You need IV nutrition for a long period.  You need frequent blood draws for lab tests.  You need dialysis.  Implanted ports are usually placed in the chest area, but they can also be placed in the upper arm, the abdomen, or the leg. An implanted port has two main parts:  Reservoir. The reservoir is round and will appear as a small, raised area under your skin. The reservoir is the part where a needle is inserted to give medicines or draw blood.  Catheter. The catheter is a thin, flexible tube that extends from the reservoir. The catheter is placed into a large vein. Medicine that is inserted into the reservoir goes into the catheter and then into the vein.  How will I care for my incision site? Do not get the incision site wet. Bathe or shower as directed by your health care provider. How is my port accessed? Special steps must be taken to access the port:  Before the port is accessed, a numbing cream can be placed on the skin. This helps numb the skin over the port site.  Your health care provider uses a sterile technique to access the port. ? Your health care provider must put on a mask and sterile gloves. ? The skin over your port is cleaned carefully with an antiseptic and allowed to dry. ? The port is gently pinched between sterile gloves, and a needle is inserted into the port.  Only "non-coring" port needles should be used to access the port. Once the port is accessed, a blood return should be checked. This helps ensure that the port  is in the vein and is not clogged.  If your port needs to remain accessed for a constant infusion, a clear (transparent) bandage will be placed over the needle site. The bandage and needle will need to be changed every week, or as directed by your health care provider.  Keep the bandage covering the needle clean and dry. Do not get it wet. Follow your health care provider's instructions on how to take a shower or bath while the port is accessed.  If your port does not need to stay accessed, no bandage is needed over the port.  What is flushing? Flushing helps keep the port from getting clogged. Follow your health care provider's instructions on how and when to flush the port. Ports are usually flushed with saline solution or a medicine called heparin. The need for flushing will depend on how the port is used.  If the port is used for intermittent medicines or blood draws, the port will need to be flushed: ? After medicines have been given. ? After blood has been drawn. ? As part of routine maintenance.  If a constant infusion is running, the port may not need to be flushed.  How long will my port stay implanted? The port can stay in for as long as your health care provider thinks it is needed. When it is time for the port to come out, surgery will be   done to remove it. The procedure is similar to the one performed when the port was put in. When should I seek immediate medical care? When you have an implanted port, you should seek immediate medical care if:  You notice a bad smell coming from the incision site.  You have swelling, redness, or drainage at the incision site.  You have more swelling or pain at the port site or the surrounding area.  You have a fever that is not controlled with medicine.  This information is not intended to replace advice given to you by your health care provider. Make sure you discuss any questions you have with your health care provider. Document  Released: 05/25/2005 Document Revised: 10/31/2015 Document Reviewed: 01/30/2013 Elsevier Interactive Patient Education  2017 Elsevier Inc.  

## 2018-04-14 NOTE — Progress Notes (Signed)
Pt son removed dressing prior to dressing and needle change. Also noted some dirt around area of port. Encouraged pt and son to keep dressing on at all times unless it is being changed, and encouraged good cleaning with provided resources through home health when accessing or changing dressing. Pt and son verbalized understanding.

## 2018-04-14 NOTE — Telephone Encounter (Signed)
Gave patient son appointment calendar per 11/7 scheduling message

## 2018-04-14 NOTE — Patient Instructions (Signed)
CT guided drainage of rim enhancing fluid collection  F/U 05/26/2018  Thank you very much Ms. Lyla Glassing for allowing me to provide care for you today.  I appreciate your confidence in choosing our Gynecologic Oncology team.  If you have any questions about your visit today please call our office and we will get back to you as soon as possible.  Please consider using the website Medlineplus.gov as an Geneticist, molecular.   Francetta Found. Zyrion Coey MD., PhD Gynecologic Oncology

## 2018-04-15 ENCOUNTER — Other Ambulatory Visit: Payer: Self-pay | Admitting: Radiology

## 2018-04-15 LAB — CA 125: CANCER ANTIGEN (CA) 125: 22.8 U/mL (ref 0.0–38.1)

## 2018-04-18 ENCOUNTER — Ambulatory Visit (HOSPITAL_COMMUNITY)
Admission: RE | Admit: 2018-04-18 | Discharge: 2018-04-18 | Disposition: A | Discharge status: Home / Self Care | Payer: Medicare PPO | Source: Ambulatory Visit | Attending: Diagnostic Radiology | Admitting: Diagnostic Radiology

## 2018-04-18 ENCOUNTER — Encounter (HOSPITAL_COMMUNITY): Payer: Self-pay

## 2018-04-18 ENCOUNTER — Ambulatory Visit (HOSPITAL_COMMUNITY)
Admission: RE | Admit: 2018-04-18 | Discharge: 2018-04-18 | Disposition: A | Discharge status: Home / Self Care | Payer: Medicare PPO | Source: Ambulatory Visit | Attending: Gynecologic Oncology | Admitting: Gynecologic Oncology

## 2018-04-18 VITALS — BP 148/83 | HR 100 | Temp 98.1°F | Resp 14

## 2018-04-18 DIAGNOSIS — J449 Chronic obstructive pulmonary disease, unspecified: Secondary | ICD-10-CM | POA: Diagnosis not present

## 2018-04-18 DIAGNOSIS — E785 Hyperlipidemia, unspecified: Secondary | ICD-10-CM | POA: Insufficient documentation

## 2018-04-18 DIAGNOSIS — Z90711 Acquired absence of uterus with remaining cervical stump: Secondary | ICD-10-CM | POA: Insufficient documentation

## 2018-04-18 DIAGNOSIS — N739 Female pelvic inflammatory disease, unspecified: Secondary | ICD-10-CM

## 2018-04-18 DIAGNOSIS — Z8042 Family history of malignant neoplasm of prostate: Secondary | ICD-10-CM | POA: Diagnosis not present

## 2018-04-18 DIAGNOSIS — Z79899 Other long term (current) drug therapy: Secondary | ICD-10-CM | POA: Insufficient documentation

## 2018-04-18 DIAGNOSIS — Z9889 Other specified postprocedural states: Secondary | ICD-10-CM | POA: Insufficient documentation

## 2018-04-18 DIAGNOSIS — Z803 Family history of malignant neoplasm of breast: Secondary | ICD-10-CM | POA: Insufficient documentation

## 2018-04-18 DIAGNOSIS — G4730 Sleep apnea, unspecified: Secondary | ICD-10-CM | POA: Insufficient documentation

## 2018-04-18 DIAGNOSIS — R895 Abnormal microbiological findings in specimens from other organs, systems and tissues: Secondary | ICD-10-CM | POA: Diagnosis not present

## 2018-04-18 DIAGNOSIS — Z978 Presence of other specified devices: Secondary | ICD-10-CM | POA: Diagnosis not present

## 2018-04-18 DIAGNOSIS — Z8542 Personal history of malignant neoplasm of other parts of uterus: Secondary | ICD-10-CM | POA: Diagnosis not present

## 2018-04-18 DIAGNOSIS — L02211 Cutaneous abscess of abdominal wall: Secondary | ICD-10-CM | POA: Diagnosis not present

## 2018-04-18 DIAGNOSIS — K5730 Diverticulosis of large intestine without perforation or abscess without bleeding: Secondary | ICD-10-CM | POA: Insufficient documentation

## 2018-04-18 DIAGNOSIS — R188 Other ascites: Secondary | ICD-10-CM | POA: Insufficient documentation

## 2018-04-18 DIAGNOSIS — I719 Aortic aneurysm of unspecified site, without rupture: Secondary | ICD-10-CM | POA: Diagnosis not present

## 2018-04-18 DIAGNOSIS — Z8049 Family history of malignant neoplasm of other genital organs: Secondary | ICD-10-CM | POA: Diagnosis not present

## 2018-04-18 DIAGNOSIS — M1990 Unspecified osteoarthritis, unspecified site: Secondary | ICD-10-CM | POA: Diagnosis not present

## 2018-04-18 DIAGNOSIS — Z8249 Family history of ischemic heart disease and other diseases of the circulatory system: Secondary | ICD-10-CM | POA: Insufficient documentation

## 2018-04-18 DIAGNOSIS — Z885 Allergy status to narcotic agent status: Secondary | ICD-10-CM | POA: Insufficient documentation

## 2018-04-18 LAB — CBC WITH DIFFERENTIAL/PLATELET
ABS IMMATURE GRANULOCYTES: 0.03 10*3/uL (ref 0.00–0.07)
BASOS PCT: 1 %
Basophils Absolute: 0 10*3/uL (ref 0.0–0.1)
EOS ABS: 0.3 10*3/uL (ref 0.0–0.5)
EOS PCT: 4 %
HCT: 30.7 % — ABNORMAL LOW (ref 36.0–46.0)
Hemoglobin: 9.2 g/dL — ABNORMAL LOW (ref 12.0–15.0)
Immature Granulocytes: 1 %
Lymphocytes Relative: 37 %
Lymphs Abs: 2.4 10*3/uL (ref 0.7–4.0)
MCH: 22 pg — AB (ref 26.0–34.0)
MCHC: 30 g/dL (ref 30.0–36.0)
MCV: 73.4 fL — ABNORMAL LOW (ref 80.0–100.0)
MONO ABS: 0.5 10*3/uL (ref 0.1–1.0)
Monocytes Relative: 8 %
Neutro Abs: 3.2 10*3/uL (ref 1.7–7.7)
Neutrophils Relative %: 49 %
PLATELETS: 299 10*3/uL (ref 150–400)
RBC: 4.18 MIL/uL (ref 3.87–5.11)
RDW: 17.5 % — AB (ref 11.5–15.5)
WBC: 6.5 10*3/uL (ref 4.0–10.5)
nRBC: 0 % (ref 0.0–0.2)

## 2018-04-18 LAB — COMPREHENSIVE METABOLIC PANEL
ALT: 19 U/L (ref 0–44)
ANION GAP: 8 (ref 5–15)
AST: 28 U/L (ref 15–41)
Albumin: 2.9 g/dL — ABNORMAL LOW (ref 3.5–5.0)
Alkaline Phosphatase: 60 U/L (ref 38–126)
BUN: 12 mg/dL (ref 8–23)
CHLORIDE: 103 mmol/L (ref 98–111)
CO2: 29 mmol/L (ref 22–32)
CREATININE: 0.65 mg/dL (ref 0.44–1.00)
Calcium: 9.2 mg/dL (ref 8.9–10.3)
GFR calc non Af Amer: >60 mL/min (ref >60)
Glucose, Bld: 115 mg/dL — ABNORMAL HIGH (ref 70–99)
POTASSIUM: 2.8 mmol/L — AB (ref 3.5–5.1)
SODIUM: 140 mmol/L (ref 135–145)
Total Bilirubin: 0.5 mg/dL (ref 0.3–1.2)
Total Protein: 7.9 g/dL (ref 6.5–8.1)

## 2018-04-18 LAB — PROTIME-INR
INR: 1.24
Prothrombin Time: 15.4 seconds — ABNORMAL HIGH (ref 11.4–15.2)

## 2018-04-18 MED ORDER — HEPARIN SOD (PORK) LOCK FLUSH 100 UNIT/ML IV SOLN
INTRAVENOUS | Status: AC
  Administered 2018-04-18: 500 [IU]
  Filled 2018-04-18: qty 5

## 2018-04-18 MED ORDER — MIDAZOLAM HCL 2 MG/2ML IJ SOLN
INTRAMUSCULAR | Status: AC
  Filled 2018-04-18: qty 4

## 2018-04-18 MED ORDER — LIDOCAINE HCL 1 % IJ SOLN
INTRAMUSCULAR | Status: AC | PRN
  Administered 2018-04-18: 10 mL

## 2018-04-18 MED ORDER — HEPARIN SOD (PORK) LOCK FLUSH 100 UNIT/ML IV SOLN
500.0000 [IU] | INTRAVENOUS | Status: AC | PRN
  Administered 2018-04-18: 500 [IU]

## 2018-04-18 MED ORDER — POTASSIUM CHLORIDE CRYS ER 20 MEQ PO TBCR
40.0000 meq | EXTENDED_RELEASE_TABLET | Freq: Once | ORAL | Status: AC
  Administered 2018-04-18: 40 meq via ORAL
  Filled 2018-04-18: qty 2

## 2018-04-18 MED ORDER — FENTANYL CITRATE (PF) 100 MCG/2ML IJ SOLN
INTRAMUSCULAR | Status: AC | PRN
  Administered 2018-04-18 (×2): 25 ug via INTRAVENOUS
  Administered 2018-04-18: 50 ug via INTRAVENOUS

## 2018-04-18 MED ORDER — POTASSIUM CHLORIDE 20 MEQ PO PACK: 40.0000 meq | PACK | Freq: Once | ORAL | Status: DC

## 2018-04-18 MED ORDER — MIDAZOLAM HCL 2 MG/2ML IJ SOLN
INTRAMUSCULAR | Status: AC | PRN
  Administered 2018-04-18: 0.5 mg via INTRAVENOUS
  Administered 2018-04-18: 1 mg via INTRAVENOUS
  Administered 2018-04-18: 0.5 mg via INTRAVENOUS

## 2018-04-18 MED ORDER — FENTANYL CITRATE (PF) 100 MCG/2ML IJ SOLN
INTRAMUSCULAR | Status: AC
  Filled 2018-04-18: qty 2

## 2018-04-18 MED ORDER — IOHEXOL 300 MG/ML  SOLN
25.0000 mL | Freq: Once | INTRAMUSCULAR | Status: AC | PRN
  Administered 2018-04-18: 30 mL

## 2018-04-18 MED ORDER — SODIUM CHLORIDE 0.9 % IV SOLN
INTRAVENOUS | Status: DC
  Administered 2018-04-18: 08:00:00 via INTRAVENOUS

## 2018-04-18 NOTE — Procedures (Signed)
  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  Findings: 10 Fr drain in left pelvic collection.  Minimal bloody fluid removed (1 ml).  Contrast injection confirmed a fistula connection to adjacent colon.   Complications: None     EBL: Minimal  Plan: Plan for discharge to home.  Keep drain to suction bulb.  Will follow up in IR clinic in one week with CT and drain injection.

## 2018-04-18 NOTE — H&P (Signed)
Referring Physician(s): Guy Sandifer  Supervising Physician: Markus Daft  Patient Status:  [facility]  Chief Complaint:  "I'm here for a drainage procedure"  Subjective: Patient familiar to IR service from prior omental biopsy on 10/08/2017, Port-A-Cath placement and paracentesis on 10/14/2017 and left thoracentesis on 10/24/2017.  She has a history of primary peritoneal adenocarcinoma with prior surgical debulking on 01/27/2018.  CT scan on 04/07/2018 revealed:  Persistent rim enhancing fluid and gas collection in the left pelvic sidewall measuring 7.2 x 3.0 cm, and not significantly changed compared to prior study. Surgical drain remains in place in the central pelvis.  Sigmoid colon diverticulosis, with muscular hypertrophy versus mild diverticulitis.  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 Consider vascular surgery referral/consultation if not already obtained.  Request now received for CT-guided aspiration and possible drainage of the left pelvic fluid collection.  She currently denies fever, headache, chest pain, back pain, nausea, vomiting or bleeding.  She does have some dyspnea with exertion, occasional cough and some mild generalized abdominal discomfort.  Past Medical History:  Diagnosis Date  . Acute on chronic respiratory failure with hypoxia ([place]) 03/07/2018  . Anxiety and depression   . Aortic aneurysm ([place])    see ov note 03/2017   . Arthritis   . Carcinomatosis ([place]) 10/2017  . COPD (chronic obstructive pulmonary disease) ([place]) 10/23/2017  . Depression   . Family history of breast cancer   . Family history of prostate cancer   . Family history of uterine cancer   . History of diverticulitis   . HLD (hyperlipidemia) 10/23/2017  . Hypercholesterolemia   . Hypertension   . Mitral valve prolapse   . Sleep apnea    cpap machine mask and tubing in [place] per patient.  Patient does not have mask or tubing to bring on DOS of  01/27/2018.   Marland Kitchen Uterine cancer ([place])    had partial hysterectomy at 27   Past Surgical History:  Procedure Laterality Date  . ABDOMINAL HYSTERECTOMY    . cardiac monitor implant    . CEREBRAL ANEURYSM REPAIR    . CERVICAL SPINE SURGERY     bone spurs  . DEBULKING N/A 01/27/2018   Procedure: RADICAL DEBULKING;  Surgeon: [name], MD;  Location: [facility];  Service: Gynecology;  Laterality: N/A;  . IR FLUORO GUIDE PORT INSERTION RIGHT  10/14/2017  . IR PARACENTESIS  10/14/2017  . IR US GUIDE VASC ACCESS RIGHT  10/14/2017  . LAPAROTOMY N/A 01/27/2018   Procedure: EXPLORATORY LAPAROTOMY WITH REMOVAL OF BILATERAL TUBES AND OVARIES;  Surgeon: [name], MD;  Location: [facility];  Service: Gynecology;  Laterality: N/A;  . LAPAROTOMY N/A 01/31/2018   Procedure: EXPLORATORY LAPAROTOMY, ABDOMINAL WASHOUT;  Surgeon: Isabel Caprice, MD;  Location: [facility];  Service: Gynecology;  Laterality: N/A;  . OMENTECTOMY N/A 01/27/2018   Procedure: OMENTECTOMY;  Surgeon: [name], MD;  Location: [facility];  Service: Gynecology;  Laterality: N/A;  . PARTIAL HYSTERECTOMY  1971   "precancer"      Allergies: Oxycodone  Medications: Prior to Admission medications   Medication Sig Start Date End Date Taking? Authorizing Provider  acetaminophen (TYLENOL) 325 MG tablet Take 2 tablets (650 mg total) by mouth every 6 (six) hours as needed for mild pain, fever or headache. 03/04/18  Yes Cross, Melissa D, NP  enalapril (VASOTEC) 5 MG tablet Take 1 tablet by mouth daily. 03/23/18  Yes [provider]  famotidine (PEPCID) 20 MG  tablet TK 1 T PO BID 03/23/18  Yes [provider]  metoprolol succinate (TOPROL-XL) 25 MG 24 hr tablet TK 1 T PO BID 03/23/18  Yes [provider]  mirtazapine (REMERON) 15 MG tablet Per family, it is 30 mg 03/23/18  Yes [provider]  lidocaine-prilocaine (EMLA) cream Apply 1 application topically as needed. 03/31/18   [name], MD    MAGNESIUM-OXIDE 400 (241.3 Mg) MG tablet TK 2 TS PO BID X 3 DAYS 03/23/18   [provider]     Vital Signs: Heart rate 97, respirations 18, O2 sat 98% room air, temperature 97.9 BP (!) 159/93   Physical Exam awake, alert.  Chest clear to auscultation bilaterally.  Clean, intact right chest wall Port-A-Cath.  Heart with regular rate and rhythm.  Abdomen soft, positive bowel sounds, mild generalized tenderness to palpation.  No lower extremity edema.  Imaging: No results found.  Labs:  CBC: Recent Labs    03/29/18 1330 04/05/18 1355 04/14/18 1347 04/18/18 0742  WBC 7.4 7.4 6.7 6.5  HGB 9.4* 9.2* 9.2* 9.2*  HCT 30.4* 29.7* 30.2* 30.7*  PLT 301 340 336 299    COAGS: Recent Labs    10/08/17 0710  01/31/18 0850 02/17/18 0500 02/17/18 0838 03/05/18 0704 04/18/18 0742  INR 1.22   < > 1.36 1.28 1.28 1.24 1.24  APTT 32  --  36  --   --   --   --    < > = values in this interval not displayed.    BMP: Recent Labs    03/29/18 1330 04/05/18 1355 04/14/18 1347 04/18/18 0742  NA 137 141 140 140  K 3.1* 3.3* 2.8* 2.8*  CL 98 102 102 103  CO2 27 31 27 29   GLUCOSE 140* 101* 100* 115*  BUN 14 8 9 12   CALCIUM 9.3 9.1 9.5 9.2  CREATININE 0.76 0.74 0.71 0.65  GFRNONAA >60 >60 >60 >60  GFRAA >60 >60 >60 >60    LIVER FUNCTION TESTS: Recent Labs    03/29/18 1330 04/05/18 1355 04/14/18 1347 04/18/18 0742  BILITOT 0.4 0.4 0.4 0.5  AST 24 30 28 28   ALT 9 17 15 19   ALKPHOS 104 92 83 60  PROT 9.0* 8.5* 8.9* 7.9  ALBUMIN 2.6* 2.6* 2.9* 2.9*    Assessment and Plan: Pt with history of primary peritoneal adenocarcinoma with prior surgical debulking on 01/27/2018.  CT scan on 04/07/2018 revealed:  Persistent rim enhancing fluid and gas collection in the left pelvic sidewall measuring 7.2 x 3.0 cm, and not significantly changed compared to prior study. Surgical drain remains in place in the central pelvis.  Sigmoid colon diverticulosis, with muscular  hypertrophy versus mild diverticulitis.  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 Consider vascular surgery referral/consultation if not already obtained.  Request now received for CT-guided aspiration and possible drainage of the left pelvic fluid collection.Risks and benefits discussed with the patient including bleeding, infection, damage to adjacent structures, bowel perforation/fistula connection, and sepsis.  All of the patient's questions were answered, patient is agreeable to proceed. Consent signed and in chart.     Electronically Signed: D. Rowe Robert, PA-C 04/18/2018, 8:29 AM   I spent a total of 25 minutes at the the patient's bedside AND on the patient's hospital floor or unit, greater than 50% of which was counseling/coordinating care for CT-guided pelvic fluid collection drainage

## 2018-04-18 NOTE — Discharge Instructions (Signed)
Moderate Conscious Sedation, Adult, Care After These instructions provide you with information about caring for yourself after your procedure. Your health care provider may also give you more specific instructions. Your treatment has been planned according to current medical practices, but problems sometimes occur. Call your health care provider if you have any problems or questions after your procedure. What can I expect after the procedure? After your procedure, it is common:  To feel sleepy for several hours.  To feel clumsy and have poor balance for several hours.  To have poor judgment for several hours.  To vomit if you eat too soon.  Follow these instructions at home: For at least 24 hours after the procedure:   Do not: ? Participate in activities where you could fall or become injured. ? Drive. ? Use heavy machinery. ? Drink alcohol. ? Take sleeping pills or medicines that cause drowsiness. ? Make important decisions or sign legal documents. ? Take care of children on your own.  Rest. Eating and drinking  Follow the diet recommended by your health care provider.  If you vomit: ? Drink water, juice, or soup when you can drink without vomiting. ? Make sure you have little or no nausea before eating solid foods. General instructions  Have a responsible adult stay with you until you are awake and alert.  Take over-the-counter and prescription medicines only as told by your health care provider.  If you smoke, do not smoke without supervision.  Keep all follow-up visits as told by your health care provider. This is important. Contact a health care provider if:  You keep feeling nauseous or you keep vomiting.  You feel light-headed.  You develop a rash.  You have a fever. Get help right away if:  You have trouble breathing. This information is not intended to replace advice given to you by your health care provider. Make sure you discuss any questions you  have with your health care provider. Document Released: 03/15/2013 Document Revised: 10/28/2015 Document Reviewed: 09/14/2015 Elsevier Interactive Patient Education  2018 [place] Surgical drains are used to remove extra fluid that normally builds up in a surgical wound after surgery. A surgical drain helps to heal a surgical wound. Different kinds of surgical drains include:  Active drains. These drains use suction to pull drainage away from the surgical wound. Drainage flows through a tube to a container outside of the body. It is important to keep the bulb or the drainage container flat (compressed) at all times, except while you empty it. Flattening the bulb or container creates suction. The two most common types of active drains are bulb drains and Hemovac drains.  Passive drains. These drains allow fluid to drain naturally, by gravity. Drainage flows through a tube to a bandage (dressing) or a container outside of the body. Passive drains do not need to be emptied. The most common type of passive drain is the Penrose drain.  A drain is placed during surgery. Immediately after surgery, drainage is usually bright red and a little thicker than water. The drainage may gradually turn yellow or pink and become thinner. It is likely that your health care provider will remove the drain when the drainage stops or when the amount decreases to 1-2 Tbsp (15-30 mL) during a 24-hour period. How to care for your surgical drain  Keep the skin around the drain dry and covered with a dressing at all times.  Check your drain area  every day for signs of infection. Check for: ? More redness, swelling, or pain. ? Pus or a bad smell. ? Cloudy drainage. Follow instructions from your health care provider about how to take care of your drain and how to change your dressing. Change your dressing at least one time every day. Change it more often if needed to keep the dressing dry.  Make sure you: 1. Gather your supplies, including: ? Tape. ? Germ-free cleaning solution (sterile saline). ? Split gauze drain sponge: 4 x 4 inches (10 x 10 cm). ? Gauze square: 4 x 4 inches (10 x 10 cm). 2. Wash your hands with soap and water before you change your dressing. If soap and water are not available, use hand sanitizer. 3. Remove the old dressing. Avoid using scissors to do that. 4. Use sterile saline to clean your skin around the drain. 5. Place the tube through the slit in a drain sponge. Place the drain sponge so that it covers your wound. 6. Place the gauze square or another drain sponge on top of the drain sponge that is on the wound. Make sure the tube is between those layers. 7. Tape the dressing to your skin. 8. If you have an active bulb or Hemovac drain, tape the drainage tube to your skin 1-2 inches (2.5-5 cm) below the place where the tube enters your body. Taping keeps the tube from pulling on any stitches (sutures) that you have. 9. Wash your hands with soap and water. 10. Write down the color of your drainage and how often you change your dressing.  How to empty your active bulb or Hemovac drain 1. Make sure that you have a measuring cup that you can empty your drainage into. 2. Wash your hands with soap and water. If soap and water are not available, use hand sanitizer. 3. Gently move your fingers down the tube while squeezing very lightly. This is called stripping the tube. This clears any drainage, clots, or tissue from the tube. ? Do not pull on the tube. ? You may need to strip the tube several times every day to keep the tube clear. 4. Open the bulb cap or the drain plug. Do not touch the inside of the cap or the bottom of the plug. 5. Empty all of the drainage into the measuring cup. 6. Compress the bulb or the container and replace the cap or the plug. To compress the bulb or the container, squeeze it firmly in the middle while you close the cap or plug the  container. 7. Write down the amount of drainage that you have in each 24-hour period. If you have less than 2 Tbsp (30 mL) of drainage during 24 hours, contact your health care provider. 8. Flush the drainage down the toilet. 9. Wash your hands with soap and water. Contact a health care provider if:  You have more redness, swelling, or pain around your drain area.  The amount of drainage that you have is increasing instead of decreasing.  You have pus or a bad smell coming from your drain area.  You have a fever.  You have drainage that is cloudy.  There is a sudden stop or a sudden decrease in the amount of drainage that you have.  Your tube falls out.  Your active draindoes not stay compressedafter you empty it. This information is not intended to replace advice given to you by your health care provider. Make sure you discuss any questions you have with  your health care provider. Document Released: 05/22/2000 Document Revised: 10/31/2015 Document Reviewed: 12/12/2014 Elsevier Interactive Patient Education  2018 [place].    Drainage Catheter Home Guide A drainage catheter is a thin, flexible tube that helps to drain excess fluid that has collected in the abdomen. Draining the fluid can help to prevent pain and other problems from developing. Contact a health care provider if:  You have skin irritation, redness, or pain where the catheter was inserted (insertion site).  You have trouble draining fluid.  You have trouble caring for your catheter.  You have abdominal pain, bloating, or cramping. Get help right away if:  You develop a fever or chills.  You have increasing redness or increasing pain around the insertion site.  You have blood or pus coming from the insertion site.  Fluid that drains out of the catheter becomes cloudy or has a bad smell.  You have abdominal pain or cramping that worsens or does not go away. This information is not intended to replace  advice given to you by your health care provider. Make sure you discuss any questions you have with your health care provider. Document Released: 05/14/2011 Document Revised: 10/31/2015 Document Reviewed: 05/21/2014 Elsevier Interactive Patient Education  2018 [place] this sheet to all of your post-operative appointments while you have your drains.  Please measure your drains by CC's or ML's.  Make sure you drain and measure your JP Drains 3 times per day.  At the end of each day, add up totals for the left side and add up totals for the right side.    ( 9 am )     ( 3 pm )        ( 9 pm )                Date L  R  L  R  L  R  Total L/R

## 2018-04-19 ENCOUNTER — Other Ambulatory Visit: Payer: Self-pay | Admitting: Gynecologic Oncology

## 2018-04-19 ENCOUNTER — Other Ambulatory Visit (HOSPITAL_COMMUNITY): Payer: Self-pay | Admitting: Radiology

## 2018-04-19 ENCOUNTER — Telehealth: Payer: Self-pay | Admitting: Oncology

## 2018-04-19 DIAGNOSIS — N739 Female pelvic inflammatory disease, unspecified: Secondary | ICD-10-CM

## 2018-04-19 DIAGNOSIS — R52 Pain, unspecified: Secondary | ICD-10-CM

## 2018-04-19 MED ORDER — TRAMADOL HCL 50 MG PO TABS: 50.0000 mg | ORAL_TABLET | Freq: Four times a day (QID) | ORAL | 0 refills | Status: DC | PRN

## 2018-04-19 NOTE — Telephone Encounter (Signed)
Left a message with Jo Parker to see how [name] is feeling.  Requested a return call.

## 2018-04-19 NOTE — Progress Notes (Signed)
See navigator note 

## 2018-04-19 NOTE — Telephone Encounter (Signed)
[name] called back and said Ryver is doing OK.  She had some pain last night from her drain but was resting this morning.  He said she is taking tylenol/ibuprofen as needed but is wondering if she can get a refill on pain medication.  He also said Dr. Skeet Latch called and left a message last night.

## 2018-04-20 ENCOUNTER — Telehealth: Payer: Self-pay | Admitting: Oncology

## 2018-04-20 ENCOUNTER — Other Ambulatory Visit: Payer: Self-pay | Admitting: Gynecologic Oncology

## 2018-04-20 DIAGNOSIS — C482 Malignant neoplasm of peritoneum, unspecified: Secondary | ICD-10-CM

## 2018-04-20 DIAGNOSIS — N739 Female pelvic inflammatory disease, unspecified: Secondary | ICD-10-CM

## 2018-04-20 MED ORDER — METRONIDAZOLE 500 MG PO TABS: 500.0000 mg | ORAL_TABLET | Freq: Three times a day (TID) | ORAL | 0 refills | Status: DC

## 2018-04-20 NOTE — Telephone Encounter (Signed)
Called Jo Parker and discussed that the culture from the pelvic fluid is showing some gram + rods and that the radiologist is recommending that [name] take antibiotics.  Advised him that the antibiotics would be Levaquin and flagyl and discussed that she was on antibiotics in the hospital for the abcess and that there may be side effects.  He said he would like to discuss it with his niece and will call back.

## 2018-04-20 NOTE — Telephone Encounter (Addendum)
[name] called back and said he talked with his niece and they would like Lilie to take the antibiotics.  They would like the Levaquin and flagyl sent to [facility] in [place].

## 2018-04-20 NOTE — Telephone Encounter (Signed)
Called [facility] with referral for aortic aneurysm.  They said they will call Pamala Hurry with the appointment.

## 2018-04-20 NOTE — Progress Notes (Signed)
Per Dr. Skeet Latch, pt can be prescribed Levaquin and flagyl for pelvic abscess that was recently drained with drain placed.  Unable to give Levaquin due to hx of dissecting aortic aneurysm.  Spoke with Lennette Bihari in IR.  Plan to prescribe Flagyl 500 mg TID for ten days. See navigator's note.  Plan to refer pt to vascular surgery if not already done.

## 2018-04-20 NOTE — Telephone Encounter (Signed)
Called Jo Parker and advised him that Levaquin is contraindicated with aortic aneurysms per [name], NP.  Discussed that only Flagyl will be ordered at this time.  He verbalized agreement.  Also asked if she is seeing a doctor for the aneurysm and he said she is not but does need a referral.

## 2018-04-20 NOTE — Addendum Note (Signed)
Addended by: Elmo Putt R on: 04/20/2018 02:30 PM   Modules accepted: Orders

## 2018-04-21 ENCOUNTER — Telehealth: Payer: Self-pay | Admitting: Oncology

## 2018-04-21 ENCOUNTER — Inpatient Hospital Stay: Payer: Medicare PPO

## 2018-04-21 DIAGNOSIS — C482 Malignant neoplasm of peritoneum, unspecified: Secondary | ICD-10-CM

## 2018-04-21 DIAGNOSIS — C569 Malignant neoplasm of unspecified ovary: Secondary | ICD-10-CM | POA: Diagnosis not present

## 2018-04-21 LAB — CBC WITH DIFFERENTIAL (CANCER CENTER ONLY)
Abs Immature Granulocytes: 0.02 10*3/uL (ref 0.00–0.07)
BASOS PCT: 1 %
Basophils Absolute: 0 10*3/uL (ref 0.0–0.1)
Eosinophils Absolute: 0.2 10*3/uL (ref 0.0–0.5)
Eosinophils Relative: 3 %
HCT: 31.2 % — ABNORMAL LOW (ref 36.0–46.0)
Hemoglobin: 9.3 g/dL — ABNORMAL LOW (ref 12.0–15.0)
IMMATURE GRANULOCYTES: 0 %
LYMPHS PCT: 43 %
Lymphs Abs: 2.7 10*3/uL (ref 0.7–4.0)
MCH: 21.4 pg — ABNORMAL LOW (ref 26.0–34.0)
MCHC: 29.8 g/dL — ABNORMAL LOW (ref 30.0–36.0)
MCV: 71.9 fL — ABNORMAL LOW (ref 80.0–100.0)
MONOS PCT: 8 %
Monocytes Absolute: 0.5 10*3/uL (ref 0.1–1.0)
NEUTROS ABS: 2.8 10*3/uL (ref 1.7–7.7)
NEUTROS PCT: 45 %
PLATELETS: 318 10*3/uL (ref 150–400)
RBC: 4.34 MIL/uL (ref 3.87–5.11)
RDW: 17.4 % — ABNORMAL HIGH (ref 11.5–15.5)
WBC: 6.3 10*3/uL (ref 4.0–10.5)
nRBC: 0 % (ref 0.0–0.2)

## 2018-04-21 LAB — CMP (CANCER CENTER ONLY)
ALT: 16 U/L (ref 0–44)
AST: 28 U/L (ref 15–41)
Albumin: 3.1 g/dL — ABNORMAL LOW (ref 3.5–5.0)
Alkaline Phosphatase: 79 U/L (ref 38–126)
Anion gap: 11 (ref 5–15)
BUN: 9 mg/dL (ref 8–23)
CHLORIDE: 105 mmol/L (ref 98–111)
CO2: 27 mmol/L (ref 22–32)
CREATININE: 0.74 mg/dL (ref 0.44–1.00)
Calcium: 9.3 mg/dL (ref 8.9–10.3)
GFR, Est AFR Am: >60 mL/min (ref >60)
GFR, Estimated: >60 mL/min (ref >60)
GLUCOSE: 96 mg/dL (ref 70–99)
Potassium: 3.5 mmol/L (ref 3.5–5.1)
SODIUM: 143 mmol/L (ref 135–145)
Total Bilirubin: 0.3 mg/dL (ref 0.3–1.2)
Total Protein: 8.5 g/dL — ABNORMAL HIGH (ref 6.5–8.1)

## 2018-04-21 LAB — MAGNESIUM: Magnesium: 1.2 mg/dL — CL (ref 1.7–2.4)

## 2018-04-21 MED ORDER — SODIUM CHLORIDE 0.9% FLUSH
10.0000 mL | Freq: Once | INTRAVENOUS | Status: AC
  Administered 2018-04-21: 10 mL
  Filled 2018-04-21: qty 10

## 2018-04-21 MED ORDER — HEPARIN SOD (PORK) LOCK FLUSH 100 UNIT/ML IV SOLN
500.0000 [IU] | Freq: Once | INTRAVENOUS | Status: AC
  Administered 2018-04-21: 500 [IU]
  Filled 2018-04-21: qty 5

## 2018-04-21 NOTE — Telephone Encounter (Signed)
Called about 11/16

## 2018-04-22 ENCOUNTER — Other Ambulatory Visit: Payer: Self-pay

## 2018-04-22 DIAGNOSIS — C569 Malignant neoplasm of unspecified ovary: Secondary | ICD-10-CM

## 2018-04-22 MED ORDER — MAGNESIUM SULFATE 2 GM/50ML IV SOLN
2.0000 g | Freq: Once | INTRAVENOUS | Status: AC
  Administered 2018-04-23: 2 g via INTRAVENOUS
  Filled 2018-04-22: qty 50

## 2018-04-22 MED ORDER — SODIUM CHLORIDE 0.9 % IV SOLN: 2.0000 g | Freq: Once | INTRAVENOUS | Status: DC

## 2018-04-23 ENCOUNTER — Inpatient Hospital Stay: Payer: Medicare PPO

## 2018-04-23 VITALS — BP 155/94 | HR 97 | Temp 98.9°F | Resp 16

## 2018-04-23 DIAGNOSIS — C482 Malignant neoplasm of peritoneum, unspecified: Secondary | ICD-10-CM

## 2018-04-23 DIAGNOSIS — C569 Malignant neoplasm of unspecified ovary: Secondary | ICD-10-CM | POA: Diagnosis not present

## 2018-04-23 LAB — AEROBIC/ANAEROBIC CULTURE (SURGICAL/DEEP WOUND)

## 2018-04-23 LAB — AEROBIC/ANAEROBIC CULTURE W GRAM STAIN (SURGICAL/DEEP WOUND): Special Requests: NORMAL

## 2018-04-23 MED ORDER — SODIUM CHLORIDE 0.9% FLUSH
10.0000 mL | Freq: Once | INTRAVENOUS | Status: AC
  Administered 2018-04-23: 10 mL
  Filled 2018-04-23: qty 10

## 2018-04-23 MED ORDER — HEPARIN SOD (PORK) LOCK FLUSH 100 UNIT/ML IV SOLN
500.0000 [IU] | Freq: Once | INTRAVENOUS | Status: AC
  Administered 2018-04-23: 500 [IU]
  Filled 2018-04-23: qty 5

## 2018-04-23 NOTE — Patient Instructions (Signed)
Hypomagnesemia  Hypomagnesemia is a condition in which the level of magnesium in the blood is low. Magnesium is a mineral that is found in many foods. It is used in many different processes in the body. Hypomagnesemia can affect every organ in the body. It can cause life-threatening problems.  What are the causes?  Causes of hypomagnesemia include:   Not getting enough magnesium in your diet.   Malnutrition.   Problems with absorbing magnesium from the intestines.   Dehydration.   Alcohol abuse.   Vomiting.   Severe diarrhea.   Some medicines, including medicines that make you urinate more.   Certain diseases, such as kidney disease, diabetes, and overactive thyroid.    What are the signs or symptoms?   Involuntary shaking or trembling of a body part (tremor).   Confusion.   Muscle weakness.   Sensitivity to light, sound, and touch.   Psychiatric issues, such as depression, irritability, or psychosis.   Sudden tightening of muscles (muscle spasms).   Tingling in the arms and legs.   A feeling of fluttering of the heart.  These symptoms are more severe if magnesium levels drop suddenly.  How is this diagnosed?  To make a diagnosis, your health care provider will do a physical exam and order blood and urine tests.  How is this treated?  Treatment will depend on the cause and the severity of your condition. It may involve:   A magnesium supplement. This can be taken in pill form. It can also be given through an IV tube. This is usually done if the condition is severe.   Changes to your diet. You may be directed to eat foods that have a lot of magnesium, such as green leafy vegetables, peas, beans, and nuts.   Eliminating alcohol from your diet.    Follow these instructions at home:   Include foods with magnesium in your diet. Foods that are rich in magnesium include green vegetables, beans, nuts and seeds, and whole grains.   Take medicines only as directed by your health care provider.   Take  magnesium supplements if your health care provider instructs you to do that. Take them as directed.   Have your magnesium levels monitored as directed by your health care provider.   When you are active, drink fluids that contain electrolytes.   Keep all follow-up visits as directed by your health care provider. This is important.  Contact a health care provider if:   You get worse instead of better.   Your symptoms return.  Get help right away if:   Your symptoms are severe.  This information is not intended to replace advice given to you by your health care provider. Make sure you discuss any questions you have with your health care provider.  Document Released: 02/18/2005 Document Revised: 10/31/2015 Document Reviewed: 01/08/2014  Elsevier Interactive Patient Education  2018 [employer]vier Inc[employer]

## 2018-04-25 ENCOUNTER — Telehealth: Payer: Self-pay | Admitting: Hematology and Oncology

## 2018-04-25 ENCOUNTER — Telehealth: Payer: Self-pay | Admitting: Oncology

## 2018-04-25 NOTE — Telephone Encounter (Signed)
Scheduled appt per 11/18 sch message - pt is aware of appt date and time  

## 2018-04-25 NOTE — Telephone Encounter (Signed)
[facility] and left a message for [name] regarding appointment with Dr. Marcello Moores.  Requested a return call.

## 2018-04-26 ENCOUNTER — Other Ambulatory Visit: Payer: Self-pay | Admitting: Gynecologic Oncology

## 2018-04-26 ENCOUNTER — Other Ambulatory Visit (HOSPITAL_COMMUNITY): Payer: Self-pay | Admitting: Diagnostic Radiology

## 2018-04-26 ENCOUNTER — Ambulatory Visit
Admission: RE | Admit: 2018-04-26 | Discharge: 2018-04-26 | Disposition: A | Discharge status: Home / Self Care | Payer: Medicare PPO | Source: Ambulatory Visit | Attending: Gynecologic Oncology | Admitting: Gynecologic Oncology

## 2018-04-26 ENCOUNTER — Other Ambulatory Visit (HOSPITAL_COMMUNITY): Payer: Self-pay | Admitting: Radiology

## 2018-04-26 ENCOUNTER — Ambulatory Visit
Admission: RE | Admit: 2018-04-26 | Discharge: 2018-04-26 | Disposition: A | Discharge status: Home / Self Care | Payer: Medicare PPO | Source: Ambulatory Visit | Attending: Radiology | Admitting: Radiology

## 2018-04-26 ENCOUNTER — Encounter: Payer: Self-pay | Admitting: Radiology

## 2018-04-26 DIAGNOSIS — N739 Female pelvic inflammatory disease, unspecified: Secondary | ICD-10-CM

## 2018-04-26 HISTORY — PX: IR RADIOLOGIST EVAL & MGMT: IMG5224

## 2018-04-26 MED ORDER — IOPAMIDOL (ISOVUE-300) INJECTION 61%
100.0000 mL | Freq: Once | INTRAVENOUS | Status: AC | PRN
  Administered 2018-04-26: 100 mL via INTRAVENOUS

## 2018-04-26 NOTE — Progress Notes (Signed)
Patient ID: Jo Parker, female   DOB: 07/02/1945, 72 y.o.   MRN: [id number]       Chief Complaint: Patient was seen in consultation today for drain follow-up  Referring Physician(s): [name] MD; [name] NP  History of Present Illness: Jo Parker is a 72 y.o. female with primary peritoneal adenocarcinoma.  Patient has undergone chemotherapy and surgery with robotic assisted bilateral salpingo-oophorectomy and omentectomy.  Patient had a complicated postoperative course and was found to have a persistent air-fluid collection along the left pelvic sidewall that was concerning for an abscess.  Patient underwent percutaneous drainage of this pelvic abscess collection on 04/18/2018.  At the time of the drain placement, small amount of fluid was removed and a drain injection confirmed a fistula connection between the abscess cavity and the sigmoid colon.  The drain culture was positive for multiple organisms and the patient has completed antibiotic treatment per surgical oncology team.  Patient reports approximately 15 mL of cloudy output from the drain per day.  She denies fevers or chills.  She complains that the drain is pulling but no significant abdominal or pelvic pain.  Patient continues to have a midline wound and she is changing dressings regularly.  Past Medical History:  Diagnosis Date  . Acute on chronic respiratory failure with hypoxia ([place]) 03/07/2018  . Anxiety and depression   . Aortic aneurysm ([place])    see ov note 03/2017   . Arthritis   . Carcinomatosis ([place]) 10/2017  . COPD (chronic obstructive pulmonary disease) ([place]) 10/23/2017  . Depression   . Family history of breast cancer   . Family history of prostate cancer   . Family history of uterine cancer   . History of diverticulitis   . HLD (hyperlipidemia) 10/23/2017  . Hypercholesterolemia   . Hypertension   . Mitral valve prolapse   . Sleep apnea    cpap machine mask and tubing in [place] per  patient.  Patient does not have mask or tubing to bring on DOS of 01/27/2018.   Marland Kitchen Uterine cancer ([place])    had partial hysterectomy at 27    Past Surgical History:  Procedure Laterality Date  . ABDOMINAL HYSTERECTOMY    . cardiac monitor implant    . CEREBRAL ANEURYSM REPAIR    . CERVICAL SPINE SURGERY     bone spurs  . DEBULKING N/A 01/27/2018   Procedure: RADICAL DEBULKING;  Surgeon: [name], MD;  Location: [facility];  Service: Gynecology;  Laterality: N/A;  . IR FLUORO GUIDE PORT INSERTION RIGHT  10/14/2017  . IR PARACENTESIS  10/14/2017  . IR RADIOLOGIST EVAL & MGMT  04/26/2018  . IR US GUIDE VASC ACCESS RIGHT  10/14/2017  . LAPAROTOMY N/A 01/27/2018   Procedure: EXPLORATORY LAPAROTOMY WITH REMOVAL OF BILATERAL TUBES AND OVARIES;  Surgeon: [name], MD;  Location: [facility];  Service: Gynecology;  Laterality: N/A;  . LAPAROTOMY N/A 01/31/2018   Procedure: EXPLORATORY LAPAROTOMY, ABDOMINAL WASHOUT;  Surgeon: Isabel Caprice, MD;  Location: [facility];  Service: Gynecology;  Laterality: N/A;  . OMENTECTOMY N/A 01/27/2018   Procedure: OMENTECTOMY;  Surgeon: [name], MD;  Location: [facility];  Service: Gynecology;  Laterality: N/A;  . PARTIAL HYSTERECTOMY  1971   "precancer"    Allergies: Oxycodone  Medications: Prior to Admission medications   Medication Sig Start Date End Date Taking? Authorizing Provider  acetaminophen (TYLENOL) 325 MG tablet Take 2 tablets (650 mg total) by mouth every 6 (six) hours as needed for  mild pain, fever or headache. 03/04/18   Cross, Lenna Sciara D, NP  enalapril (VASOTEC) 5 MG tablet Take 1 tablet by mouth daily. 03/23/18   [provider]  famotidine (PEPCID) 20 MG tablet TK 1 T PO BID 03/23/18   [provider]  lidocaine-prilocaine (EMLA) cream Apply 1 application topically as needed. 03/31/18   [name], MD  MAGNESIUM-OXIDE 400 (241.3 Mg) MG tablet TK 2 TS PO BID X 3 DAYS 03/23/18   [provider]  metoprolol succinate  (TOPROL-XL) 25 MG 24 hr tablet TK 1 T PO BID 03/23/18   [provider]  metroNIDAZOLE (FLAGYL) 500 MG tablet Take 1 tablet (500 mg total) by mouth 3 (three) times daily. 04/20/18   [name] D, NP  mirtazapine (REMERON) 15 MG tablet Per family, it is 30 mg 03/23/18   [provider]  traMADol (ULTRAM) 50 MG tablet Take 1 tablet (50 mg total) by mouth every 6 (six) hours as needed for severe pain. 04/19/18   [name] D, NP     Family History  Problem Relation Age of Onset  . Endometrial cancer Sister 58       endometrial ca  . Endometrial cancer Paternal Aunt 80  . Breast cancer Paternal Aunt   . Stomach cancer Maternal Grandfather        d. 13s  . Prostate cancer Paternal Uncle   . Breast cancer Cousin        pat first cousin  . Breast cancer Cousin        pat first cousin's daughter  . Lung cancer Father        lung ca  . Heart disease Mother   . Stroke Paternal Grandmother   . Endometrial cancer Cousin        paternal first cousin's daughter  . Breast cancer Other        niece dx less than 87s  . Breast cancer Other        niece, dx under 61    Social History   Socioeconomic History  . Marital status: Married    Spouse name: Not on file  . Number of children: 3  . Years of education: Not on file  . Highest education level: Not on file  Occupational History  . Occupation: retired  Scientific laboratory technician  . Financial resource strain: Not on file  . Food insecurity:    Worry: Not on file    Inability: Not on file  . Transportation needs:    Medical: Not on file    Non-medical: Not on file  Tobacco Use  . Smoking status: Former Smoker    Packs/day: 1.00    Years: 55.00    Pack years: 55.00    Last attempt to quit: 10/06/2017    Years since quitting: 0.5  . Smokeless tobacco: Never Used  Substance and Sexual Activity  . Alcohol use: Never    Frequency: Never  . Drug use: Never  . Sexual activity: Not Currently  Lifestyle  . Physical  activity:    Days per week: Not on file    Minutes per session: Not on file  . Stress: Not on file  Relationships  . Social connections:    Talks on phone: Not on file    Gets together: Not on file    Attends religious service: Not on file    Active member of club or organization: Not on file    Attends meetings of clubs or organizations:  Not on file    Relationship status: Not on file  Other Topics Concern  . Not on file  Social History Narrative  . Not on file      Review of Systems  Constitutional: Negative for chills and fever.  Gastrointestinal: Negative.     Vital Signs: BP (!) 141/74   Pulse (!) 101   Temp 98 F (36.7 C)   SpO2 99%   Physical Exam  Constitutional: She is oriented to person, place, and time. No distress.  Cardiovascular: Normal rate, regular rhythm and normal heart sounds.  Pulmonary/Chest: Effort normal and breath sounds normal.  Abdominal: Soft. Bowel sounds are normal. She exhibits no distension. There is no tenderness.  Bandage over the midline wound.    Drain in the left lower quadrant of the abdomen/pelvis.  Skin around the drain is healthy.  Small amount of cloudy yellow/white fluid in the drainage bag.  Neurological: She is alert and oriented to person, place, and time.  Vitals reviewed.      Imaging: Ct Abdomen Pelvis W Contrast  Result Date: 04/26/2018 CLINICAL DATA:  72 year old with peritoneal adenocarcinoma and left pelvic sidewall abscess collection. Percutaneous drain was placed in the left pelvic abscess on 04/18/2018. Patient presents for follow-up. Patient reports minimal output from the drain. A fistula connection between the abscess collection and the adjacent colon was identified during the tube placement. EXAM: CT ABDOMEN AND PELVIS WITH CONTRAST TECHNIQUE: Multidetector CT imaging of the abdomen and pelvis was performed using the standard protocol following bolus administration of intravenous contrast. CONTRAST:  143mL  ISOVUE-300 IOPAMIDOL (ISOVUE-300) INJECTION 61% COMPARISON:  04/18/2018 and 04/07/2018 FINDINGS: Lower chest: Stable scarring along the posterior right lower lobe. Otherwise, the lung bases are clear without pleural effusions. Cardiac loop recorder in the left anterior chest subcutaneous tissues. Calcifications involving the visualized thoracic aorta. No significant pericardial fluid. Hepatobiliary: Gallbladder has an elongated configuration but this is unchanged and no evidence for acute inflammatory changes. Normal appearance of the liver without focal lesion and no biliary dilatation. Main portal venous system is patent. Distal common bile duct measures roughly 0.6 cm. Pancreas: Unremarkable. No pancreatic ductal dilatation or surrounding inflammatory changes. Spleen: Normal in size without focal abnormality. Adrenals/Urinary Tract: Normal adrenal glands. Normal appearance of both kidneys without suspicious renal lesions and no hydronephrosis. There is a low-density structure in the left kidney lower pole that measures 2.2 cm and suggestive for a cortical cyst. Urinary bladder is decompressed and difficult to evaluate. Stomach/Bowel: Mild wall thickening in the distal stomach is similar to the previous examination. Duodenum and small bowel are unremarkable. High-density material within diverticula in the sigmoid colon. No acute inflammatory changes involving the colon. Vascular/Lymphatic: Again noted is focal saccular aneurysm at the aortic hiatus measuring up to 3.8 cm and minimally changed. Atherosclerotic calcifications involving the visceral arteries. No aneurysm involving the infrarenal abdominal aorta. Iliac arteries are heavily calcified. 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 Proximal femoral arteries are patent bilaterally. Venous structures are unremarkable. Reproductive: Status post hysterectomy. No adnexal masses. Other: Previously, there was a  surgical drain in the left lower quadrant and pelvis. There is still a gas tract in the left hemipelvis associated with the old drain tract. However, the amount of gas from the old drain tract has decreased since 04/18/2018. The complex air-fluid collection along the left pelvic sidewall has been decompressed with the percutaneous drain. Drain enters just medial and superior to the  left common femoral vessels. There is no longer a discrete abscess collection along the left pelvic sidewall but there continues to be soft tissue thickening and multiple small pockets of gas between the decompressed pocket and the old surgical drain tract. The small fluid collections within the left pelvic collection have resolved. No new fluid or abscess collections in the abdomen or pelvis. Soft tissue changes along the left anterior abdominal wall and subcutaneous tissues are related to previous surgery and old drain site. No free fluid in the abdomen or pelvis. Musculoskeletal: No acute bone abnormality. Degenerative facet disease in the lumbar spine. Vertebral body heights are maintained in the visualized spine. IMPRESSION: 1. Left pelvic sidewall abscess collection is decompressed with the percutaneous drain. No residual fluid. There continues to be soft tissue thickening and small pockets of gas in this area. Findings concerning for a residual colonic fistula tract but this will be further characterized with drain injection with fluoroscopy. No new abscess or fluid collections. 2. Persistent gas in the left pelvis related to the old surgical drain site. However, this gas-filled cavity is decreasing in size compared to 04/18/2018. 3. 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 Aneurysm measures up to 3.8 cm. Aortic aneurysm NOS (ICD10-I71.9). Aortic Atherosclerosis (ICD10-I70.0). 4. 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 Electronically Signed   By: Markus Daft M.D.   On: 04/26/2018 10:42   Ct  Abdomen Pelvis W Contrast  Result Date: 04/08/2018 CLINICAL DATA:  Follow-up pelvic abscess. Ovarian carcinoma. Currently undergoing chemotherapy. EXAM: CT ABDOMEN AND PELVIS WITH CONTRAST TECHNIQUE: Multidetector CT imaging of the abdomen and pelvis was performed using the standard protocol following bolus administration of intravenous contrast. CONTRAST:  128mL OMNIPAQUE IOHEXOL 300 MG/ML  SOLN COMPARISON:  03/08/2018 FINDINGS: Lower Chest: No acute findings. Chronic bibasilar scarring and peripheral bronchiectasis. Hepatobiliary: No hepatic masses identified. Gallbladder is unremarkable. Pancreas:  No mass or inflammatory changes. Spleen: Within normal limits in size and appearance. Adrenals/Urinary Tract: No masses identified. Small cyst again seen in the lower pole of the left kidney. No evidence of ureteral calculi or hydronephrosis. Stomach/Bowel: Diverticulosis of sigmoid colon is again demonstrated with persistent mild wall thickening which may be due to mild diverticulitis or muscular hypertrophy. A rim enhancing collection containing fluid and gas is seen in the left pelvic sidewall adjacent to the sigmoid colon. This measures 7.2 x 3.0 cm and is not significantly changed compared to previous study. A surgical drain remains within the central pelvis. No new or enlarging fluid collections identified. Vascular/Lymphatic: No pathologically enlarged lymph nodes. 3.7 cm dissecting saccular aortic aneurysm at the diaphragmatic hiatus is unchanged. No evidence of rupture. Aortic atherosclerosis. Reproductive: Prior hysterectomy noted. Adnexal regions are unremarkable in appearance. Other:  No evidence of ascites. Musculoskeletal:  No suspicious bone lesions identified. IMPRESSION: Persistent rim enhancing fluid and gas collection in the left pelvic sidewall measuring 7.2 x 3.0 cm, and not significantly changed compared to prior study. Surgical drain remains in place in the central pelvis. Sigmoid colon  diverticulosis, with muscular hypertrophy versus mild diverticulitis. 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 Consider vascular surgery referral/consultation if not already obtained. Electronically Signed   By: Earle Gell M.D.   On: 04/08/2018 08:15   Dg Sinus/fist Tube Chk-non Gi  Result Date: 04/26/2018 INDICATION: 72 year old with left pelvic abscess collection and known colonic fistula. Patient presents for follow-up. Abscess collection has essentially resolved on CT and patient reports approximately 15 mL  of output from the drain each day. EXAM: ABSCESS DRAIN INJECTION WITH FLUOROSCOPY MEDICATIONS: None ANESTHESIA/SEDATION: None COMPLICATIONS: None immediate. PROCEDURE: Patient was placed supine on the fluoroscopic table. The drain was injected with 7 mL of Omnipaque 300. Drain was flushed with normal saline at the end of the procedure. Drain was attached to a gravity bag. FINDINGS: Drain injection demonstrates intermediate filling of a tract between the drain and the adjacent sigmoid colon. Contrast also fills a few irregular small collections adjacent to the drain but there is no large abscess cavity. IMPRESSION: Positive for a fistula tract between the percutaneous drain and the sigmoid colon. No significant abscess cavity. Electronically Signed   By: Markus Daft M.D.   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 Findings are concerning for an abscess with air bowel fistula. Plan for CT-guided aspiration and drain placement. EXAM: CT-GUIDED DRAIN PLACEMENT WITHIN THE LEFT COLLECTION MEDICATIONS: No antibiotics were given for this procedure. ANESTHESIA/SEDATION: Fentanyl 100 mcg IV; Versed 2 mg IV Moderate Sedation Time:  47 minutes The patient was continuously monitored during the procedure by  the interventional radiology nurse under my direct supervision. COMPLICATIONS: None immediate. PROCEDURE: Informed written consent was obtained from the patient after a thorough discussion of the procedural risks, benefits and alternatives. All questions were addressed. A timeout was performed prior to the initiation of the procedure. Patient was placed supine on the CT scanner. Images through the pelvis were obtained. The predominantly gas-filled collection along the left pelvic sidewall was identified. The left lower pelvis was prepped and draped in a sterile fashion. Skin and soft tissues were anesthetized with 1% lidocaine. 77 gauge trocar needle was directed just medial to the left femoral vessels and just below the left inferior epigastric vessels. Needle was directed into the gas-filled collection with CT guidance. No fluid could be aspirated from the needle. Stiff Amplatz wire was advanced into the collection. Tract was dilated to accommodate a 10 [place] drain. Approximately 1 mL of thick bloody fluid was aspirated. Contrast was injected through the drain to evaluate for a bowel fistula. Catheter was flushed with saline and attached to a suction bulb. Catheter was sutured to sin and a StatLock was placed. FINDINGS: Irregular gas-filled collection along the left pelvic sidewall. Only 1 ml of thick bloody fluid could be aspirated. Contrast injection demonstrated filling of the adjacent sigmoid colon and findings compatible with a colonic fistula. IMPRESSION: CT-guided placement of a drainage catheter within the left pelvic sidewall collection. The collection contains mostly gas and only 1 mL bloody fluid could be removed. The fluid was sent for culture. 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 Will schedule for follow-up in the IR clinic in 1 week with a CT and drain injection. Electronically Signed   By: Markus Daft M.D.   On: 04/18/2018 17:54   Ir Radiologist Eval & Mgmt  Result  Date: 04/26/2018 Please refer to notes tab for details about interventional procedure. (Op Note)   Labs:  CBC: Recent Labs    04/05/18 1355 04/14/18 1347 04/18/18 0742 04/21/18 1357  WBC 7.4 6.7 6.5 6.3  HGB 9.2* 9.2* 9.2* 9.3*  HCT 29.7* 30.2* 30.7* 31.2*  PLT 340 336 299 318    COAGS: Recent Labs    10/08/17 0710  01/31/18 0850 02/17/18 0500 02/17/18 0838 03/05/18 0704 04/18/18 0742  INR  1.22   < > 1.36 1.28 1.28 1.24 1.24  APTT 32  --  36  --   --   --   --    < > = values in this interval not displayed.    BMP: Recent Labs    04/05/18 1355 04/14/18 1347 04/18/18 0742 04/21/18 1357  NA 141 140 140 143  K 3.3* 2.8* 2.8* 3.5  CL 102 102 103 105  CO2 31 27 29 27   GLUCOSE 101* 100* 115* 96  BUN 8 9 12 9   CALCIUM 9.1 9.5 9.2 9.3  CREATININE 0.74 0.71 0.65 0.74  GFRNONAA >60 >60 >60 >60  GFRAA >60 >60 >60 >60    LIVER FUNCTION TESTS: Recent Labs    04/05/18 1355 04/14/18 1347 04/18/18 0742 04/21/18 1357  BILITOT 0.4 0.4 0.5 0.3  AST 30 28 28 28   ALT 17 15 19 16   ALKPHOS 92 83 60 79  PROT 8.5* 8.9* 7.9 8.5*  ALBUMIN 2.6* 2.9* 2.9* 3.1*    TUMOR MARKERS: No results for input(s): AFPTM, CEA, CA199, CHROMGRNA in the last 8760 hours.  Assessment and Plan:  72 year old with history of peritoneal carcinoma and postoperative left pelvic abscess collection.  Percutaneous drain was placed on 04/18/2018 and today's CT confirms that the fluid component of the abscess has resolved.  There continues to be soft tissue thickening and gas pockets around the tube and abscess cavity.  Drain injection confirms a persistent fistula tract between the drain site and the sigmoid colon.  I reviewed the patient's imaging with the patient and her sons.  I explained that it can take weeks to months for these fistula tracts to heal.  Switched the drainage bag from a suction bulb to a gravity bag.  Instructed the patient to flush the drain once a day with 5 mL of saline.  We  actually showed the patient how to do the injections because she was not doing it properly.  Schedule patient for follow-up CT and drain injection in 2 weeks.  Patient understands that we will try to treat the colonic fistula conservatively with the drain and she may require long-term follow-up with multiple drain injections.  Patient will contact us if she has any questions or concerns in the interim.   Thank you for this interesting consult.  I greatly enjoyed meeting Jo Parker and look forward to participating in their care.  A copy of this report was sent to the requesting provider on this date.  Electronically Signed: Burman Riis 04/26/2018, 12:09 PM   I spent a total of    15 Minutes in face to face in clinical consultation, greater than 50% of which was counseling/coordinating care for pelvic abscess and drain care.

## 2018-04-29 ENCOUNTER — Telehealth: Payer: Self-pay | Admitting: Oncology

## 2018-04-29 ENCOUNTER — Inpatient Hospital Stay: Payer: Medicare PPO

## 2018-04-29 ENCOUNTER — Telehealth: Payer: Self-pay

## 2018-04-29 DIAGNOSIS — C482 Malignant neoplasm of peritoneum, unspecified: Secondary | ICD-10-CM

## 2018-04-29 DIAGNOSIS — C569 Malignant neoplasm of unspecified ovary: Secondary | ICD-10-CM | POA: Diagnosis not present

## 2018-04-29 LAB — CMP (CANCER CENTER ONLY)
ALT: 11 U/L (ref 0–44)
AST: 23 U/L (ref 15–41)
Albumin: 3.3 g/dL — ABNORMAL LOW (ref 3.5–5.0)
Alkaline Phosphatase: 79 U/L (ref 38–126)
Anion gap: 10 (ref 5–15)
BUN: 14 mg/dL (ref 8–23)
CO2: 27 mmol/L (ref 22–32)
CREATININE: 0.83 mg/dL (ref 0.44–1.00)
Calcium: 9.3 mg/dL (ref 8.9–10.3)
Chloride: 104 mmol/L (ref 98–111)
GFR, Estimated: >60 mL/min (ref >60)
Glucose, Bld: 131 mg/dL — ABNORMAL HIGH (ref 70–99)
Potassium: 3.1 mmol/L — ABNORMAL LOW (ref 3.5–5.1)
SODIUM: 141 mmol/L (ref 135–145)
Total Bilirubin: 0.4 mg/dL (ref 0.3–1.2)
Total Protein: 8.4 g/dL — ABNORMAL HIGH (ref 6.5–8.1)

## 2018-04-29 LAB — CBC WITH DIFFERENTIAL (CANCER CENTER ONLY)
Abs Immature Granulocytes: 0.02 10*3/uL (ref 0.00–0.07)
BASOS ABS: 0 10*3/uL (ref 0.0–0.1)
Basophils Relative: 0 %
EOS PCT: 3 %
Eosinophils Absolute: 0.2 10*3/uL (ref 0.0–0.5)
HEMATOCRIT: 32.6 % — AB (ref 36.0–46.0)
Hemoglobin: 9.9 g/dL — ABNORMAL LOW (ref 12.0–15.0)
IMMATURE GRANULOCYTES: 0 %
LYMPHS ABS: 3 10*3/uL (ref 0.7–4.0)
Lymphocytes Relative: 45 %
MCH: 21.3 pg — ABNORMAL LOW (ref 26.0–34.0)
MCHC: 30.4 g/dL (ref 30.0–36.0)
MCV: 70.3 fL — ABNORMAL LOW (ref 80.0–100.0)
Monocytes Absolute: 0.5 10*3/uL (ref 0.1–1.0)
Monocytes Relative: 8 %
NRBC: 0 % (ref 0.0–0.2)
Neutro Abs: 2.9 10*3/uL (ref 1.7–7.7)
Neutrophils Relative %: 44 %
Platelet Count: 248 10*3/uL (ref 150–400)
RBC: 4.64 MIL/uL (ref 3.87–5.11)
RDW: 17.4 % — ABNORMAL HIGH (ref 11.5–15.5)
WBC Count: 6.6 10*3/uL (ref 4.0–10.5)

## 2018-04-29 LAB — MAGNESIUM: Magnesium: 1.1 mg/dL — CL (ref 1.7–2.4)

## 2018-04-29 MED ORDER — SODIUM CHLORIDE 0.9% FLUSH
10.0000 mL | Freq: Once | INTRAVENOUS | Status: AC
  Administered 2018-04-29: 10 mL
  Filled 2018-04-29: qty 10

## 2018-04-29 MED ORDER — HEPARIN SOD (PORK) LOCK FLUSH 100 UNIT/ML IV SOLN
500.0000 [IU] | Freq: Once | INTRAVENOUS | Status: AC
  Administered 2018-04-29: 500 [IU]
  Filled 2018-04-29: qty 5

## 2018-04-29 NOTE — Telephone Encounter (Signed)
Spoke with patient's son, [name], concerning lab results, low magnesium and potassium.  Nurse explained that patient to get IV magnesium x 1 week and potassium 20 meq daily per Dr. Jana Hakim.    Called Pam at [facility] and gave verbal order for 2 g IV Magnesium for 1 week.  IV fluids will be sent to patient tomorrow. Potassium 20 meq daily sent to [facility].  Schedule message sent for lab and flush for next Friday.

## 2018-04-29 NOTE — Telephone Encounter (Signed)
[name] called and asked if Nnenna would be ok to go home for Thanksgiving next week.  Advised him that her next appointment is on 05/10/2018 so she should be able to go home.

## 2018-04-30 ENCOUNTER — Other Ambulatory Visit: Payer: Self-pay | Admitting: Hematology and Oncology

## 2018-05-02 ENCOUNTER — Telehealth: Payer: Self-pay | Admitting: *Deleted

## 2018-05-02 NOTE — Telephone Encounter (Signed)
Called son regarding refill requests. He states they do not need any refills at this time

## 2018-05-03 ENCOUNTER — Telehealth: Payer: Self-pay | Admitting: Hematology and Oncology

## 2018-05-03 NOTE — Telephone Encounter (Signed)
Scheduled appt per 11/22 sch message- per patient - already has flush on 12/3 wants to get labs same say - unable to come in 11/29

## 2018-05-04 ENCOUNTER — Telehealth: Payer: Self-pay

## 2018-05-04 ENCOUNTER — Telehealth: Payer: Self-pay | Admitting: Hematology and Oncology

## 2018-05-04 NOTE — Telephone Encounter (Signed)
Tried to call regarding 11/29

## 2018-05-04 NOTE — Telephone Encounter (Signed)
She called and left a message to call her.  Called back. She is back in [place] living now. She cannot come to appt for lab this Friday 11/29. Appts canceled. She will keep lab appt prior to scan on 12/3.

## 2018-05-06 ENCOUNTER — Other Ambulatory Visit: Payer: Medicare PPO

## 2018-05-06 ENCOUNTER — Inpatient Hospital Stay: Payer: Medicare PPO

## 2018-05-10 ENCOUNTER — Ambulatory Visit
Admission: RE | Admit: 2018-05-10 | Discharge: 2018-05-10 | Disposition: A | Discharge status: Home / Self Care | Payer: Medicare PPO | Source: Ambulatory Visit | Attending: Gynecologic Oncology | Admitting: Gynecologic Oncology

## 2018-05-10 ENCOUNTER — Other Ambulatory Visit: Payer: Self-pay | Admitting: Gynecologic Oncology

## 2018-05-10 ENCOUNTER — Ambulatory Visit
Admission: RE | Admit: 2018-05-10 | Discharge: 2018-05-10 | Disposition: A | Discharge status: Home / Self Care | Payer: Medicare PPO | Source: Ambulatory Visit | Attending: Diagnostic Radiology | Admitting: Diagnostic Radiology

## 2018-05-10 ENCOUNTER — Inpatient Hospital Stay: Payer: Medicare PPO | Attending: Hematology and Oncology

## 2018-05-10 ENCOUNTER — Encounter: Payer: Self-pay | Admitting: Radiology

## 2018-05-10 ENCOUNTER — Inpatient Hospital Stay: Payer: Medicare PPO

## 2018-05-10 ENCOUNTER — Telehealth: Payer: Self-pay

## 2018-05-10 DIAGNOSIS — C562 Malignant neoplasm of left ovary: Secondary | ICD-10-CM | POA: Diagnosis present

## 2018-05-10 DIAGNOSIS — E876 Hypokalemia: Secondary | ICD-10-CM | POA: Diagnosis not present

## 2018-05-10 DIAGNOSIS — Z9221 Personal history of antineoplastic chemotherapy: Secondary | ICD-10-CM | POA: Diagnosis not present

## 2018-05-10 DIAGNOSIS — C786 Secondary malignant neoplasm of retroperitoneum and peritoneum: Secondary | ICD-10-CM | POA: Insufficient documentation

## 2018-05-10 DIAGNOSIS — Z90722 Acquired absence of ovaries, bilateral: Secondary | ICD-10-CM | POA: Insufficient documentation

## 2018-05-10 DIAGNOSIS — C482 Malignant neoplasm of peritoneum, unspecified: Secondary | ICD-10-CM

## 2018-05-10 DIAGNOSIS — C561 Malignant neoplasm of right ovary: Secondary | ICD-10-CM | POA: Insufficient documentation

## 2018-05-10 DIAGNOSIS — Z452 Encounter for adjustment and management of vascular access device: Secondary | ICD-10-CM | POA: Diagnosis not present

## 2018-05-10 DIAGNOSIS — N739 Female pelvic inflammatory disease, unspecified: Secondary | ICD-10-CM

## 2018-05-10 DIAGNOSIS — Z90710 Acquired absence of both cervix and uterus: Secondary | ICD-10-CM | POA: Insufficient documentation

## 2018-05-10 DIAGNOSIS — D6481 Anemia due to antineoplastic chemotherapy: Secondary | ICD-10-CM | POA: Insufficient documentation

## 2018-05-10 DIAGNOSIS — Z87891 Personal history of nicotine dependence: Secondary | ICD-10-CM | POA: Diagnosis not present

## 2018-05-10 DIAGNOSIS — J910 Malignant pleural effusion: Secondary | ICD-10-CM | POA: Diagnosis not present

## 2018-05-10 HISTORY — PX: IR RADIOLOGIST EVAL & MGMT: IMG5224

## 2018-05-10 LAB — CBC WITH DIFFERENTIAL (CANCER CENTER ONLY)
Abs Immature Granulocytes: 0.01 10*3/uL (ref 0.00–0.07)
BASOS PCT: 0 %
Basophils Absolute: 0 10*3/uL (ref 0.0–0.1)
Eosinophils Absolute: 0.1 10*3/uL (ref 0.0–0.5)
Eosinophils Relative: 2 %
HEMATOCRIT: 31.8 % — AB (ref 36.0–46.0)
Hemoglobin: 9.6 g/dL — ABNORMAL LOW (ref 12.0–15.0)
IMMATURE GRANULOCYTES: 0 %
Lymphocytes Relative: 43 %
Lymphs Abs: 2.7 10*3/uL (ref 0.7–4.0)
MCH: 21.1 pg — AB (ref 26.0–34.0)
MCHC: 30.2 g/dL (ref 30.0–36.0)
MCV: 69.9 fL — AB (ref 80.0–100.0)
MONOS PCT: 9 %
Monocytes Absolute: 0.5 10*3/uL (ref 0.1–1.0)
NEUTROS PCT: 46 %
Neutro Abs: 2.8 10*3/uL (ref 1.7–7.7)
PLATELETS: 213 10*3/uL (ref 150–400)
RBC: 4.55 MIL/uL (ref 3.87–5.11)
RDW: 17 % — ABNORMAL HIGH (ref 11.5–15.5)
WBC Count: 6.2 10*3/uL (ref 4.0–10.5)
nRBC: 0 % (ref 0.0–0.2)

## 2018-05-10 LAB — CMP (CANCER CENTER ONLY)
ALBUMIN: 3.5 g/dL (ref 3.5–5.0)
ALK PHOS: 82 U/L (ref 38–126)
ALT: 17 U/L (ref 0–44)
ANION GAP: 9 (ref 5–15)
AST: 27 U/L (ref 15–41)
BILIRUBIN TOTAL: 0.4 mg/dL (ref 0.3–1.2)
BUN: 25 mg/dL — ABNORMAL HIGH (ref 8–23)
CALCIUM: 9.6 mg/dL (ref 8.9–10.3)
CO2: 30 mmol/L (ref 22–32)
Chloride: 104 mmol/L (ref 98–111)
Creatinine: 0.82 mg/dL (ref 0.44–1.00)
GFR, Estimated: >60 mL/min (ref >60)
GLUCOSE: 109 mg/dL — AB (ref 70–99)
Potassium: 3.1 mmol/L — ABNORMAL LOW (ref 3.5–5.1)
Sodium: 143 mmol/L (ref 135–145)
TOTAL PROTEIN: 8.4 g/dL — AB (ref 6.5–8.1)

## 2018-05-10 LAB — MAGNESIUM: Magnesium: 1.4 mg/dL — CL (ref 1.7–2.4)

## 2018-05-10 MED ORDER — IOPAMIDOL (ISOVUE-300) INJECTION 61%
80.0000 mL | Freq: Once | INTRAVENOUS | Status: AC | PRN
  Administered 2018-05-10: 80 mL via INTRAVENOUS

## 2018-05-10 MED ORDER — HEPARIN SOD (PORK) LOCK FLUSH 100 UNIT/ML IV SOLN
500.0000 [IU] | Freq: Once | INTRAVENOUS | Status: AC
  Administered 2018-05-10: 500 [IU]
  Filled 2018-05-10: qty 5

## 2018-05-10 MED ORDER — SODIUM CHLORIDE 0.9% FLUSH
10.0000 mL | Freq: Once | INTRAVENOUS | Status: AC
  Administered 2018-05-10: 10 mL
  Filled 2018-05-10: qty 10

## 2018-05-10 NOTE — Telephone Encounter (Signed)
Called Jo Parker to verify that his Mom is back in town. She just arrived today from [place]. Per Dr. Alvy Bimler add 2 grams Magnesium to daily IV fluids. Start Magnesium IV today if [facility] delivers today. He verbalized understanding.  Called Pam with [facility] and given above message to add 2 grams Magnesium to daily IV fluids. She verbalized and will attempt to get delivered today to home.

## 2018-05-10 NOTE — Progress Notes (Signed)
Patient ID: Jo Parker, female   DOB: 1946/05/01, 72 y.o.   MRN: [id number]       Chief Complaint: Patient was seen in consultation today for drain follow-up  Referring Physician(s): [name] MD;  [name] NP Dr. [name]  History of Present Illness: Jo Parker is a 72 y.o. female with primary peritoneal adenocarcinoma.  Patient has undergone chemotherapy and surgery with robotic assisted bilateral salpingo-oophorectomy and omentectomy.  Patient had a complicated postoperative course and was found to have a persistent air-fluid collection along the left pelvic sidewall that was concerning for an abscess.  Patient underwent percutaneous drainage of this pelvic abscess collection on 04/18/2018.   She was last seen by Jo Parker on 11/19 with CT and drain injection. 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   Patient reports minimal output of about 5-10 mL of cloudy output from the drain per day.  She denies fevers or chills.  She is flushing the drain every other day. She feels well otherwise, no fevers, abd pain, N/V. States she is having normal BMs.  She returns today for follow up CT and drain injection.  Past Medical History:  Diagnosis Date  . Acute on chronic respiratory failure with hypoxia ([place]) 03/07/2018  . Anxiety and depression   . Aortic aneurysm ([place])    see ov note 03/2017   . Arthritis   . Carcinomatosis ([place]) 10/2017  . COPD (chronic obstructive pulmonary disease) ([place]) 10/23/2017  . Depression   . Family history of breast cancer   . Family history of prostate cancer   . Family history of uterine cancer   . History of diverticulitis   . HLD (hyperlipidemia) 10/23/2017  . Hypercholesterolemia   . Hypertension   . Mitral valve prolapse   . Sleep apnea    cpap machine mask and tubing in [place] per patient.  Patient does not have mask or tubing to bring on DOS of 01/27/2018.   Marland Kitchen Uterine cancer ([place])    had partial  hysterectomy at 27    Past Surgical History:  Procedure Laterality Date  . ABDOMINAL HYSTERECTOMY    . cardiac monitor implant    . CEREBRAL ANEURYSM REPAIR    . CERVICAL SPINE SURGERY     bone spurs  . DEBULKING N/A 01/27/2018   Procedure: RADICAL DEBULKING;  Surgeon: [name], MD;  Location: [facility];  Service: Gynecology;  Laterality: N/A;  . IR FLUORO GUIDE PORT INSERTION RIGHT  10/14/2017  . IR PARACENTESIS  10/14/2017  . IR RADIOLOGIST EVAL & MGMT  04/26/2018  . IR Jo Parker GUIDE VASC ACCESS RIGHT  10/14/2017  . LAPAROTOMY N/A 01/27/2018   Procedure: EXPLORATORY LAPAROTOMY WITH REMOVAL OF BILATERAL TUBES AND OVARIES;  Surgeon: [name], MD;  Location: [facility];  Service: Gynecology;  Laterality: N/A;  . LAPAROTOMY N/A 01/31/2018   Procedure: EXPLORATORY LAPAROTOMY, ABDOMINAL WASHOUT;  Surgeon: Isabel Caprice, MD;  Location: [facility];  Service: Gynecology;  Laterality: N/A;  . OMENTECTOMY N/A 01/27/2018   Procedure: OMENTECTOMY;  Surgeon: [name], MD;  Location: [facility];  Service: Gynecology;  Laterality: N/A;  . PARTIAL HYSTERECTOMY  1971   "precancer"    Allergies: Oxycodone  Medications: Prior to Admission medications   Medication Sig Start Date End Date Taking? Authorizing Provider  acetaminophen (TYLENOL) 325 MG tablet Take 2 tablets (650 mg total) by mouth every 6 (six) hours as needed for mild pain, fever or headache. 03/04/18   Cross,  Melissa D, NP  enalapril (VASOTEC) 5 MG tablet Take 1 tablet by mouth daily. 03/23/18   [provider]  famotidine (PEPCID) 20 MG tablet TK 1 T PO BID 03/23/18   [provider]  lidocaine-prilocaine (EMLA) cream Apply 1 application topically as needed. 03/31/18   [name], MD  MAGNESIUM-OXIDE 400 (241.3 Mg) MG tablet TK 2 TS PO BID X 3 DAYS 03/23/18   [provider]  metoprolol succinate (TOPROL-XL) 25 MG 24 hr tablet TK 1 T PO BID 03/23/18   [provider]  metroNIDAZOLE (FLAGYL) 500 MG  tablet Take 1 tablet (500 mg total) by mouth 3 (three) times daily. 04/20/18   [name] D, NP  mirtazapine (REMERON) 15 MG tablet Per family, it is 30 mg 03/23/18   [provider]  traMADol (ULTRAM) 50 MG tablet Take 1 tablet (50 mg total) by mouth every 6 (six) hours as needed for severe pain. 04/19/18   [name] D, NP     Family History  Problem Relation Age of Onset  . Endometrial cancer Sister 74       endometrial ca  . Endometrial cancer Paternal Aunt 80  . Breast cancer Paternal Aunt   . Stomach cancer Maternal Grandfather        d. 65s  . Prostate cancer Paternal Uncle   . Breast cancer Cousin        pat first cousin  . Breast cancer Cousin        pat first cousin's daughter  . Lung cancer Father        lung ca  . Heart disease Mother   . Stroke Paternal Grandmother   . Endometrial cancer Cousin        paternal first cousin's daughter  . Breast cancer Other        niece dx less than 100s  . Breast cancer Other        niece, dx under 57    Social History   Socioeconomic History  . Marital status: Married    Spouse name: Not on file  . Number of children: 3  . Years of education: Not on file  . Highest education level: Not on file  Occupational History  . Occupation: retired  Scientific laboratory technician  . Financial resource strain: Not on file  . Food insecurity:    Worry: Not on file    Inability: Not on file  . Transportation needs:    Medical: Not on file    Non-medical: Not on file  Tobacco Use  . Smoking status: Former Smoker    Packs/day: 1.00    Years: 55.00    Pack years: 55.00    Last attempt to quit: 10/06/2017    Years since quitting: 0.5  . Smokeless tobacco: Never Used  Substance and Sexual Activity  . Alcohol use: Never    Frequency: Never  . Drug use: Never  . Sexual activity: Not Currently  Lifestyle  . Physical activity:    Days per week: Not on file    Minutes per session: Not on file  . Stress: Not on file  Relationships    . Social connections:    Talks on phone: Not on file    Gets together: Not on file    Attends religious service: Not on file    Active member of club or organization: Not on file    Attends meetings of clubs or organizations: Not on file    Relationship status:  Not on file  Other Topics Concern  . Not on file  Social History Narrative  . Not on file      Review of Systems  Constitutional: Negative for chills and fever.  Gastrointestinal: Negative.     Vital Signs: There were no vitals taken for this visit.  Physical Exam  Constitutional: She is oriented to person, place, and time. No distress.  Cardiovascular: Normal rate, regular rhythm and normal heart sounds.  Pulmonary/Chest: Effort normal and breath sounds normal.  Abdominal: Soft. Bowel sounds are normal. She exhibits no distension. There is no tenderness.  Bandage over the midline wound.    Drain in the left lower quadrant of the abdomen/pelvis.  Skin around the drain is healthy.  Small amount of cloudy yellow/white fluid and air in the drainage bag.  Neurological: She is alert and oriented to person, place, and time.  Vitals reviewed.      Imaging: Ct Abdomen Pelvis W Contrast  Result Date: 05/10/2018 CLINICAL DATA:  72 year old with history of peritoneal adenocarcinoma and left pelvic sidewall abscess collection. Percutaneous drain placed on 04/18/2018. History of fistula connection between the abscess collection and adjacent colon. Follow-up examination. EXAM: CT ABDOMEN AND PELVIS WITH CONTRAST TECHNIQUE: Multidetector CT imaging of the abdomen and pelvis was performed using the standard protocol following bolus administration of intravenous contrast. CONTRAST:  13mL ISOVUE-300 IOPAMIDOL (ISOVUE-300) INJECTION 61% COMPARISON:  CT 04/26/2018 FINDINGS: Lower chest: Chronic scarring at the right lung base. No pleural effusions. Hepatobiliary: Normal appearance of the liver, gallbladder and portal venous system. No  biliary dilatation. Pancreas: Unremarkable. No pancreatic ductal dilatation or surrounding inflammatory changes. Spleen: Trace perisplenic fluid appears similar to the prior examination. Otherwise, normal appearance of the spleen. Adrenals/Urinary Tract: Normal adrenal glands. Again noted is a cyst in the left kidney lower pole. Negative for hydronephrosis. No suspicious renal lesions. Urinary bladder is decompressed. Stomach/Bowel: Again noted is extensive diverticulosis in the sigmoid colon but no acute inflammatory changes around the colon. Normal appearance of stomach and small bowel. No evidence for bowel dilatation or obstruction. Vascular/Lymphatic: Again noted is a focal saccular aneurysm at the aortic hiatus measuring up to 3.8 cm and stable. Atherosclerotic disease in the aorta and visceral arteries. 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 No lymph node enlargement in the abdomen or pelvis. Reproductive: Status post hysterectomy. No adnexal masses. Other: Again noted is a percutaneous drain along the left side of the pelvis extending from an anterior approach. Drain has minimally changed in position. There is no fluid collection associated this drain but there continues to be small amount of gas along the medial side of the drain which is probably associated with the adjacent colon and a fistula tract. No significant inflammatory changes in this area. Previously, there was a gas-filled track in the central pelvis related to the old surgical drain but this has become less conspicuous or resolved. No free fluid in the pelvis. No new fluid or abscess collections. Irregularity along the anterior abdominal wall compatible with prior surgical changes. Musculoskeletal: No acute bone abnormality. IMPRESSION: 1. Left pelvic sidewall abscess collection remains decompressed with the percutaneous drain. No residual fluid collection. There continues to be a small amount of  gas adjacent to the drain and likely related to a fistula tract. Patient is scheduled for a drain injection. 2. No new abscess or fluid collections. Again noted is trace perisplenic fluid. 3. 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 Aneurysm measures  up to 3.8 cm. Aortic aneurysm NOS (ICD10-I71.9). Aortic Atherosclerosis (ICD10-I70.0). Electronically Signed   By: Markus Daft M.D.   On: 05/10/2018 14:58   Ct Abdomen Pelvis W Contrast  Result Date: 04/26/2018 CLINICAL DATA:  72 year old with peritoneal adenocarcinoma and left pelvic sidewall abscess collection. Percutaneous drain was placed in the left pelvic abscess on 04/18/2018. Patient presents for follow-up. Patient reports minimal output from the drain. A fistula connection between the abscess collection and the adjacent colon was identified during the tube placement. EXAM: CT ABDOMEN AND PELVIS WITH CONTRAST TECHNIQUE: Multidetector CT imaging of the abdomen and pelvis was performed using the standard protocol following bolus administration of intravenous contrast. CONTRAST:  184mL ISOVUE-300 IOPAMIDOL (ISOVUE-300) INJECTION 61% COMPARISON:  04/18/2018 and 04/07/2018 FINDINGS: Lower chest: Stable scarring along the posterior right lower lobe. Otherwise, the lung bases are clear without pleural effusions. Cardiac loop recorder in the left anterior chest subcutaneous tissues. Calcifications involving the visualized thoracic aorta. No significant pericardial fluid. Hepatobiliary: Gallbladder has an elongated configuration but this is unchanged and no evidence for acute inflammatory changes. Normal appearance of the liver without focal lesion and no biliary dilatation. Main portal venous system is patent. Distal common bile duct measures roughly 0.6 cm. Pancreas: Unremarkable. No pancreatic ductal dilatation or surrounding inflammatory changes. Spleen: Normal in size without focal abnormality. Adrenals/Urinary Tract: Normal adrenal glands. Normal  appearance of both kidneys without suspicious renal lesions and no hydronephrosis. There is a low-density structure in the left kidney lower pole that measures 2.2 cm and suggestive for a cortical cyst. Urinary bladder is decompressed and difficult to evaluate. Stomach/Bowel: Mild wall thickening in the distal stomach is similar to the previous examination. Duodenum and small bowel are unremarkable. High-density material within diverticula in the sigmoid colon. No acute inflammatory changes involving the colon. Vascular/Lymphatic: Again noted is focal saccular aneurysm at the aortic hiatus measuring up to 3.8 cm and minimally changed. Atherosclerotic calcifications involving the visceral arteries. No aneurysm involving the infrarenal abdominal aorta. Iliac arteries are heavily calcified. 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 Proximal femoral arteries are patent bilaterally. Venous structures are unremarkable. Reproductive: Status post hysterectomy. No adnexal masses. Other: Previously, there was a surgical drain in the left lower quadrant and pelvis. There is still a gas tract in the left hemipelvis associated with the old drain tract. However, the amount of gas from the old drain tract has decreased since 04/18/2018. The complex air-fluid collection along the left pelvic sidewall has been decompressed with the percutaneous drain. Drain enters just medial and superior to the left common femoral vessels. There is no longer a discrete abscess collection along the left pelvic sidewall but there continues to be soft tissue thickening and multiple small pockets of gas between the decompressed pocket and the old surgical drain tract. The small fluid collections within the left pelvic collection have resolved. No new fluid or abscess collections in the abdomen or pelvis. Soft tissue changes along the left anterior abdominal wall and subcutaneous tissues are related to  previous surgery and old drain site. No free fluid in the abdomen or pelvis. Musculoskeletal: No acute bone abnormality. Degenerative facet disease in the lumbar spine. Vertebral body heights are maintained in the visualized spine. IMPRESSION: 1. Left pelvic sidewall abscess collection is decompressed with the percutaneous drain. No residual fluid. There continues to be soft tissue thickening and small pockets of gas in this area. Findings concerning for a residual colonic fistula  tract but this will be further characterized with drain injection with fluoroscopy. No new abscess or fluid collections. 2. Persistent gas in the left pelvis related to the old surgical drain site. However, this gas-filled cavity is decreasing in size compared to 04/18/2018. 3. 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 Aneurysm measures up to 3.8 cm. Aortic aneurysm NOS (ICD10-I71.9). Aortic Atherosclerosis (ICD10-I70.0). 4. 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 Electronically Signed   By: Markus Daft M.D.   On: 04/26/2018 10:42   Dg Sinus/fist Tube Chk-non Gi  Addendum Date: 04/26/2018   ADDENDUM REPORT: 04/26/2018 16:40 ADDENDUM: Fluoroscopy time: 24 seconds, 12 mGy Electronically Signed   By: Markus Daft M.D.   On: 04/26/2018 16:40   Result Date: 04/26/2018 INDICATION: 72 year old with left pelvic abscess collection and known colonic fistula. Patient presents for follow-up. Abscess collection has essentially resolved on CT and patient reports approximately 15 mL of output from the drain each day. EXAM: ABSCESS DRAIN INJECTION WITH FLUOROSCOPY MEDICATIONS: None ANESTHESIA/SEDATION: None COMPLICATIONS: None immediate. PROCEDURE: Patient was placed supine on the fluoroscopic table. The drain was injected with 7 mL of Omnipaque 300. Drain was flushed with normal saline at the end of the procedure. Drain was attached to a gravity bag. FINDINGS: Drain injection demonstrates intermediate filling of a  tract between the drain and the adjacent sigmoid colon. Contrast also fills a few irregular small collections adjacent to the drain but there is no large abscess cavity. IMPRESSION: Positive for a fistula tract between the percutaneous drain and the sigmoid colon. No significant abscess cavity. Electronically Signed: By: Markus Daft M.D. 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 Findings are concerning for an abscess with air bowel fistula. Plan for CT-guided aspiration and drain placement. EXAM: CT-GUIDED DRAIN PLACEMENT WITHIN THE LEFT COLLECTION MEDICATIONS: No antibiotics were given for this procedure. ANESTHESIA/SEDATION: Fentanyl 100 mcg IV; Versed 2 mg IV Moderate Sedation Time:  47 minutes The patient was continuously monitored during the procedure by the interventional radiology nurse under my direct supervision. COMPLICATIONS: None immediate. PROCEDURE: Informed written consent was obtained from the patient after a thorough discussion of the procedural risks, benefits and alternatives. All questions were addressed. A timeout was performed prior to the initiation of the procedure. Patient was placed supine on the CT scanner. Images through the pelvis were obtained. The predominantly gas-filled collection along the left pelvic sidewall was identified. The left lower pelvis was prepped and draped in a sterile fashion. Skin and soft tissues were anesthetized with 1% lidocaine. 25 gauge trocar needle was directed just medial to the left femoral vessels and just below the left inferior epigastric vessels. Needle was directed into the gas-filled collection with CT guidance. No fluid could be aspirated from the needle. Stiff Amplatz wire was advanced into the collection. Tract was dilated to accommodate a 10 [place] drain. Approximately 1 mL of  thick bloody fluid was aspirated. Contrast was injected through the drain to evaluate for a bowel fistula. Catheter was flushed with saline and attached to a suction bulb. Catheter was sutured to sin and a StatLock was placed. FINDINGS: Irregular gas-filled collection along the left pelvic sidewall. Only 1 ml of thick bloody fluid could be aspirated. Contrast injection demonstrated filling of the adjacent sigmoid colon and findings compatible with a colonic fistula. IMPRESSION: CT-guided placement of a drainage  catheter within the left pelvic sidewall collection. The collection contains mostly gas and only 1 mL bloody fluid could be removed. The fluid was sent for culture. 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 Will schedule for follow-up in the IR clinic in 1 week with a CT and drain injection. Electronically Signed   By: Markus Daft M.D.   On: 04/18/2018 17:54   Ir Radiologist Eval & Mgmt  Result Date: 04/26/2018 Please refer to notes tab for details about interventional procedure. (Op Note)   Labs:  CBC: Recent Labs    04/18/18 0742 04/21/18 1357 04/29/18 1351 05/10/18 1237  WBC 6.5 6.3 6.6 6.2  HGB 9.2* 9.3* 9.9* 9.6*  HCT 30.7* 31.2* 32.6* 31.8*  PLT 299 318 248 213    COAGS: Recent Labs    10/08/17 0710  01/31/18 0850 02/17/18 0500 02/17/18 0838 03/05/18 0704 04/18/18 0742  INR 1.22   < > 1.36 1.28 1.28 1.24 1.24  APTT 32  --  36  --   --   --   --    < > = values in this interval not displayed.    BMP: Recent Labs    04/18/18 0742 04/21/18 1357 04/29/18 1351 05/10/18 1237  NA 140 143 141 143  K 2.8* 3.5 3.1* 3.1*  CL 103 105 104 104  CO2 29 27 27 30   GLUCOSE 115* 96 131* 109*  BUN 12 9 14  25*  CALCIUM 9.2 9.3 9.3 9.6  CREATININE 0.65 0.74 0.83 0.82  GFRNONAA >60 >60 >60 >60  GFRAA >60 >60 >60 >60    LIVER FUNCTION TESTS: Recent Labs    04/18/18 0742 04/21/18 1357 04/29/18 1351 05/10/18 1237  BILITOT 0.5 0.3 0.4 0.4  AST  28 28 23 27   ALT 19 16 11 17   ALKPHOS 60 79 79 82  PROT 7.9 8.5* 8.4* 8.4*  ALBUMIN 2.9* 3.1* 3.3* 3.5    TUMOR MARKERS: No results for input(s): AFPTM, CEA, CA199, CHROMGRNA in the last 8760 hours.  Assessment and Plan: 72 year old with history of peritoneal carcinoma and postoperative left pelvic abscess collection.  Percutaneous drain was placed on 04/18/2018  Repeat CT today shows abscess essentially resolved, some locules of air around the drain. Drain injection unfortunately shows fistula to the colon remains patent. Plan to continue drain to gravity for now, we switch her back to bulb as her output is low, she will just not activate the suction.  Schedule patient for follow-up drain injection in another 2 weeks.  Patient understands that we will try to treat the colonic fistula conservatively with the drain and she may require long-term follow-up with multiple drain injections.  Patient will contact Jo Parker if she has any questions or concerns in the interim.  She is also to see Dr. Alvy Bimler later this week and thinks she has an appointment with the surgical group as well.    Electronically Signed: Ascencion Dike 05/10/2018, 3:01 PM   I spent a total of 20 minutes in face to face in clinical consultation, greater than 50% of which was counseling/coordinating care for pelvic abscess and drain care.

## 2018-05-11 LAB — CA 125: Cancer Antigen (CA) 125: 18.8 U/mL (ref 0.0–38.1)

## 2018-05-13 ENCOUNTER — Telehealth: Payer: Self-pay | Admitting: Hematology and Oncology

## 2018-05-13 ENCOUNTER — Encounter: Payer: Self-pay | Admitting: Oncology

## 2018-05-13 ENCOUNTER — Telehealth: Payer: Self-pay

## 2018-05-13 ENCOUNTER — Inpatient Hospital Stay: Payer: Medicare PPO

## 2018-05-13 ENCOUNTER — Encounter: Payer: Self-pay | Admitting: Hematology and Oncology

## 2018-05-13 ENCOUNTER — Inpatient Hospital Stay (HOSPITAL_BASED_OUTPATIENT_CLINIC_OR_DEPARTMENT_OTHER): Payer: Medicare PPO | Admitting: Hematology and Oncology

## 2018-05-13 DIAGNOSIS — C562 Malignant neoplasm of left ovary: Secondary | ICD-10-CM

## 2018-05-13 DIAGNOSIS — C561 Malignant neoplasm of right ovary: Secondary | ICD-10-CM | POA: Diagnosis not present

## 2018-05-13 DIAGNOSIS — J910 Malignant pleural effusion: Secondary | ICD-10-CM | POA: Diagnosis not present

## 2018-05-13 DIAGNOSIS — D6481 Anemia due to antineoplastic chemotherapy: Secondary | ICD-10-CM

## 2018-05-13 DIAGNOSIS — E876 Hypokalemia: Secondary | ICD-10-CM

## 2018-05-13 DIAGNOSIS — C482 Malignant neoplasm of peritoneum, unspecified: Secondary | ICD-10-CM

## 2018-05-13 DIAGNOSIS — C786 Secondary malignant neoplasm of retroperitoneum and peritoneum: Secondary | ICD-10-CM | POA: Diagnosis not present

## 2018-05-13 DIAGNOSIS — T451X5A Adverse effect of antineoplastic and immunosuppressive drugs, initial encounter: Secondary | ICD-10-CM

## 2018-05-13 LAB — CMP (CANCER CENTER ONLY)
ALT: 14 U/L (ref 0–44)
AST: 24 U/L (ref 15–41)
Albumin: 3.5 g/dL (ref 3.5–5.0)
Alkaline Phosphatase: 84 U/L (ref 38–126)
Anion gap: 12 (ref 5–15)
BUN: 12 mg/dL (ref 8–23)
CO2: 27 mmol/L (ref 22–32)
Calcium: 9.6 mg/dL (ref 8.9–10.3)
Chloride: 99 mmol/L (ref 98–111)
Creatinine: 0.9 mg/dL (ref 0.44–1.00)
GFR, Est AFR Am: >60 mL/min (ref >60)
GFR, Estimated: >60 mL/min (ref >60)
Glucose, Bld: 108 mg/dL — ABNORMAL HIGH (ref 70–99)
Potassium: 3.1 mmol/L — ABNORMAL LOW (ref 3.5–5.1)
Sodium: 138 mmol/L (ref 135–145)
Total Bilirubin: 0.7 mg/dL (ref 0.3–1.2)
Total Protein: 8.5 g/dL — ABNORMAL HIGH (ref 6.5–8.1)

## 2018-05-13 LAB — CBC WITH DIFFERENTIAL (CANCER CENTER ONLY)
Abs Immature Granulocytes: 0.02 10*3/uL (ref 0.00–0.07)
Basophils Absolute: 0 10*3/uL (ref 0.0–0.1)
Basophils Relative: 0 %
Eosinophils Absolute: 0.1 10*3/uL (ref 0.0–0.5)
Eosinophils Relative: 2 %
HCT: 32.7 % — ABNORMAL LOW (ref 36.0–46.0)
Hemoglobin: 10 g/dL — ABNORMAL LOW (ref 12.0–15.0)
Immature Granulocytes: 0 %
LYMPHS PCT: 32 %
Lymphs Abs: 2.5 10*3/uL (ref 0.7–4.0)
MCH: 21.1 pg — ABNORMAL LOW (ref 26.0–34.0)
MCHC: 30.6 g/dL (ref 30.0–36.0)
MCV: 69 fL — ABNORMAL LOW (ref 80.0–100.0)
MONO ABS: 0.8 10*3/uL (ref 0.1–1.0)
Monocytes Relative: 10 %
Neutro Abs: 4.5 10*3/uL (ref 1.7–7.7)
Neutrophils Relative %: 56 %
Platelet Count: 217 10*3/uL (ref 150–400)
RBC: 4.74 MIL/uL (ref 3.87–5.11)
RDW: 16.5 % — ABNORMAL HIGH (ref 11.5–15.5)
WBC: 7.9 10*3/uL (ref 4.0–10.5)
nRBC: 0 % (ref 0.0–0.2)

## 2018-05-13 LAB — MAGNESIUM: Magnesium: 1.6 mg/dL — ABNORMAL LOW (ref 1.7–2.4)

## 2018-05-13 MED ORDER — SODIUM CHLORIDE 0.9% FLUSH
10.0000 mL | Freq: Once | INTRAVENOUS | Status: AC
  Administered 2018-05-13: 10 mL
  Filled 2018-05-13: qty 10

## 2018-05-13 MED ORDER — HEPARIN SOD (PORK) LOCK FLUSH 100 UNIT/ML IV SOLN
500.0000 [IU] | Freq: Once | INTRAVENOUS | Status: AC
  Administered 2018-05-13: 500 [IU]
  Filled 2018-05-13: qty 5

## 2018-05-13 MED ORDER — MIRTAZAPINE 30 MG PO TABS: 30.0000 mg | ORAL_TABLET | Freq: Every day | ORAL | 1 refills | Status: AC

## 2018-05-13 NOTE — Assessment & Plan Note (Signed)
She is doing very well Recent CT imaging showed no evidence of cancer recurrence Tumor marker is within normal limits She will continue close follow-up with GYN oncologist

## 2018-05-13 NOTE — Assessment & Plan Note (Signed)
This could be related to hypomagnesemia She is not symptomatic I recommend potassium rich diet only for now

## 2018-05-13 NOTE — Progress Notes (Signed)
[place] OFFICE PROGRESS NOTE  Patient Care Team: [name], MD as PCP - General (Hematology and Oncology)  ASSESSMENT & PLAN:  Primary peritoneal carcinomatosis [facility]) She is doing very well Recent CT imaging showed no evidence of cancer recurrence Tumor marker is within normal limits She will continue close follow-up with GYN oncologist  Hypomagnesemia She has chronic hypomagnesemia, likely due to poor oral intake and chronic GI loss She will continue IV magnesium replacement therapy I plan to recheck her blood work again in 2 weeks  Anemia due to antineoplastic chemotherapy She has multifactorial anemia. She is not symptomatic.  Observe only  Hypokalemia, inadequate intake This could be related to hypomagnesemia She is not symptomatic I recommend potassium rich diet only for now   No orders of the defined types were placed in this encounter.   INTERVAL HISTORY: Please see below for problem oriented charting. She returns for further follow-up with her son She is doing well She has persistent drainage tube in place due to abnormal imaging study Her appetite is stable She is gaining weight No recent infection, fever or chills The patient denies any recent signs or symptoms of bleeding such as spontaneous epistaxis, hematuria or hematochezia.   SUMMARY OF ONCOLOGIC HISTORY: Oncology History   BRCA2 positive Primary peritoneal, high grade serous     Primary peritoneal carcinomatosis ([place])   08/18/2017 Imaging    US imaging showed scular AAA measured 3.8 cm, liver nodularity with ascites and pleural effusions    09/03/2017 Imaging    CT chest elsewhere showed small bilateral pleural effusion    09/13/2017 Pathology Results    Fluid cytology positive for adenocarcinoma, strongly positive for CK7, CA-125, moderate to strong MOC-31 and B72.3. CK20, CEA and Ca19-9 were negative    09/16/2017 Imaging    CT abdomen and pelvis elsewhere showed small  bilateral pleural effusion, cardiomegaly, 2.6 cm ovoid cystic mass at the pancreatic tail, 3.5 cm saccular aneurysm, no retroperitoneal lymphadenopathy, marked sigmoid wall thickening without overt mechanical bowel obstruction, marked omental thickening and ascites.    10/07/2017 Genetic Testing    BRCA2 c.5073dupA (p.Trp1692Metfs*3) pathogenic mutation and APC c.7088A>C (p.Lys2363Arg), POLE c.1007A>G (p.Asn36Ser) and RAD51D c.434G>A (p.Arg145His) VUS identified the [facility] panel.  The [facility] gene panel offered by [employer] includes sequencing and deletion/duplication testing of the following 35 genes: APC, ATM, AXIN2, BARD1, BMPR1A, BRCA1, BRCA2, BRIP1, CHD1, CDK4, CDKN2A, CHEK2, EPCAM (large rearrangement only), HOXB13, (sequencing only), GALNT12, MLH1, MSH2, MSH3 (excluding repetitive portions of exon 1), MSH6, MUTYH, NBN, NTHL1, PALB2, PMS2, PTEN, RAD51C, RAD51D, RNF43, RPS20, SMAD4, STK11, and TP53. Sequencing was performed for select regions of POLE and POLD1, and large rearrangement analysis was performed for select regions of GREM1. The report date is Oct 07, 2017.    10/08/2017 Procedure    Extensive omental and peritoneal tumor is present anteriorly deep to the abdominal wall. From a midline approach, tumor was sampled just to the left of midline in the upper pelvis, below the level of the umbilicus. Solid tissue was obtained.  IMPRESSION: CT-guided core biopsy performed of omental/peritoneal tumor in the anterior peritoneal cavity.    10/08/2017 Pathology Results    Soft Tissue Needle Core Biopsy, Omentum/Peritoneal Cavity - POORLY DIFFERENTIATED CARCINOMA. Microscopic Comment Immunohistochemistry will be performed and reported as an addendum. Dr. [name] agrees. Called to [facility] on 10/11/17. (JDP:kh 10/11/17) ADDENDUM: Immunohistochemistry shows the tumor is positive with cytokeratin AE1/AE3, cytokeratin 7, estrogen receptor, WT-1, and p16. The tumor is  negative with CD56,  chromogranin, synaptophysin, CDX-2, cytokeratin 20, progesterone receptor, GATA-3, GCDFP, Napsin-A and thyroid transcription factor-1. 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 The catheter is ready for use.    10/14/2017 Procedure    A total of approximately 1300 mL of ascitic fluid was removed.    10/15/2017 Tumor Marker    Patient's tumor was tested for the following markers: CA-125 Results of the tumor marker test revealed 775.9    10/18/2017 -  Chemotherapy    The patient had carboplatin and Taxol    10/22/2017 - 10/27/2017 Hospital Admission    She was admitted to the hospital for evaluation of shortness of breath and was found to have large pleural effusion, successfully removed and cytology was malignant.  Sepsis was ruled out.    10/23/2017 Imaging    No evidence of pulmonary embolism.  New moderate size left effusion and new small to moderate right effusion with associated bibasilar atelectasis.  Known extensive peritoneal carcinomatosis and moderate ascites.  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 Recommend correlation with prior exams and vascular surgery consultation.  2.1 cm left renal cyst.  Mild cardiomegaly and minimal atherosclerotic coronary artery disease.  Aortic Atherosclerosis (ICD10-I70.0) and Emphysema (ICD10-J43.9).    10/24/2017 Pathology Results    PLEURAL FLUID, LEFT (SPECIMEN 1 OF 1 COLLECTED 10/24/17): MALIGNANT CELLS CONSISTENT WITH METASTATIC ADENOCARCINOMA. SEE COMMENT.    10/24/2017 Procedure    Successful ultrasound guided left thoracentesis yielding  580 mL of pleural fluid.  Follow-up chest radiograph shows no pneumothorax    11/03/2017 Tumor Marker    Patient's tumor was tested for the following markers: CA-125 Results of the tumor marker test revealed 991.9    11/29/2017 Tumor Marker    Patient's tumor was tested for the following markers: CA-125 Results of the tumor marker test revealed 133    12/14/2017 Imaging    Decreased peritoneal carcinomatosis, and near complete resolution of ascites since prior exam.  No new or progressive disease within the abdomen or pelvis.  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  Skeet Latch, M.D. 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  Miliary disease of the bilateral diaphragmatic surfaces and also the mesentery of the large and small bowel.  Left adnexa densely adherent  to the distal rectosigmoid colon.  Right adnexa adherent to the pelvic sidewall.  Multiple loops of bowel adherent to miliary disease in the vaginal cuff.  At the completion  of procedure the patient was R1 disease status     01/27/2018 Pathology Results    1. Omentum, resection for tumor, infragastric - HIGH GRADE SEROUS CARCINOMA, >2 CM. - SEE ONCOLOGY TABLE. 2. Soft tissue mass, simple excision, abdominal wall nodule - HIGH GRADE SEROUS CARCINOMA, >2 CM. - SEE ONCOLOGY TABLE. 3. Ovary and fallopian tube, left - OVARY AND FALLOPIAN TUBE WITH SURFACE SEROUS CARCINOMA. 4. Ovary and fallopian tube, right - OVARY AND FALLOPIAN TUBE WITH SURFACE SEROUS CARCINOMA. Microscopic Comment 1. OVARY or FALLOPIAN TUBE or PRIMARY PERITONEUM: Procedure: Omentectomy, soft tissue excision, bilateral salpingo-oophorectomy. Specimen Integrity: Intact. Tumor Site: See comment. Ovarian Surface Involvement (required only if applicable): Present, bilateral. Fallopian Tube Surface Involvement (required only if applicable): Present, bilateral. Tumor Size: >2 cm, see comment. Histologic Type: High grade serous carcinoma. Histologic Grade: High grade. Implants (required for advanced stage serous/seromucinous borderline tumors only): See comment. Other Tissue/ Organ Involvement: Bilateral fallopian tubes, bilateral ovaries, omentum, peritoneum. Largest Extrapelvic Peritoneal Focus (required only if applicable): >2 cm. Peritoneal/Ascitic Fluid: N/A. Pleural effusion ([id number]) positive. Treatment Effect (required only for high-grade serous carcinomas): CRS-1 Regional Lymph Nodes: No lymph nodes submitted or found Pathologic Stage Classification (pTNM, AJCC 8th Edition): ypT3c, ypNX, pM1a Representative Tumor Block: 1A-B Comment(s): The omentum is largely infiltrated by tumor (>2 cm). Both ovaries and fallopian tubes were entirely submitted. A distinct precursor lesion is not identified, only surface deposits. While it is not recommended to diagnose a primary peritoneal carcinoma after treatment, it is assumed the likely primary given the lack of precursor lesion or significant  disease in the ovaries and fallopian tubes.    01/27/2018 - 03/23/2018 Hospital Admission    She was admitted to the hospital for interval debulking surgery, complicated by sepsis and respiratory failure.    03/08/2018 Imaging    7.0 x 1.3 cm left pelvic fluid collection is noted with air-fluid level and enhancing margins, most consistent with abscess. It is slightly smaller compared to prior exam.  Stable position of surgical drainage catheter is seen in the pelvis.  Sigmoid diverticulosis is noted without inflammation.  3.9 cm aortic aneurysm is noted at aortic hiatus. Recommend followup by [name] in 2 years. This recommendation follows ACR consensus guidelines: White Paper of the ACR Incidental Findings Committee II on Vascular Findings. Joellyn Rued Radiol 2013; [phone].     03/29/2018 Tumor Marker    Patient's tumor was tested for the following markers: CA-125 Results of the tumor marker test revealed 35.3    04/07/2018 Imaging    CT abdomen and pelvis Persistent rim enhancing fluid and gas collection in the left pelvic sidewall measuring 7.2 x 3.0 cm, and not significantly changed compared to prior study. Surgical drain remains in place in the central pelvis.  Sigmoid colon diverticulosis, with muscular hypertrophy versus mild diverticulitis.  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 Consider vascular surgery referral/consultation if not already obtained.    04/18/2018 Imaging    CT-guided placement of a drainage catheter within the left pelvic sidewall collection. The collection contains mostly gas and only 1 mL bloody fluid could be removed. The fluid was sent for culture.  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  Will schedule for follow-up in the IR clinic in 1 week with a  CT and drain injection.    04/26/2018 Imaging    CT abdomen and pelvis 1. Left pelvic sidewall abscess collection is decompressed with  the percutaneous drain. No residual fluid. There continues to be soft tissue thickening and small pockets of gas in this area. Findings concerning for a residual colonic fistula tract but this will be further characterized with drain injection with fluoroscopy. No new abscess or fluid collections. 2. Persistent gas in the left pelvis related to the old surgical drain site. However, this gas-filled cavity is decreasing in size compared to 04/18/2018. 3. 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 Aneurysm measures up to 3.8 cm. Aortic aneurysm NOS (ICD10-I71.9). Aortic Atherosclerosis (ICD10-I70.0).  4. 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    04/26/2018 Procedure    Positive for a fistula tract between the percutaneous drain and the sigmoid colon.  No significant abscess cavity.     05/10/2018 Tumor Marker    Patient's tumor was tested for the following markers: CA-125 Results of the tumor marker test revealed 18.8    05/10/2018 Imaging    1. Left pelvic sidewall abscess collection remains decompressed with the percutaneous drain. No residual fluid collection. There continues to be a small amount of gas adjacent to the drain and likely related to a fistula tract. Patient is scheduled for a drain injection. 2. No new abscess or fluid collections. Again noted is trace perisplenic fluid. 3. 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 Aneurysm measures up to 3.8 cm. Aortic aneurysm NOS (ICD10-I71.9). Aortic Atherosclerosis (ICD10-I70.0).    05/10/2018 Procedure    Persistent fistula tract between the decompressed abscess cavity and the sigmoid colon. The fistula tract remains widely patent but smaller than it was on 04/26/2018.  The drain was kept in place. Instructed the patient to flush the tube every other day. Patient requested a suction bulb but we instructed the patient NOT to use suction on this bulb. Patient will follow-up for a drain injection in  approximately 2 weeks.     REVIEW OF SYSTEMS:   Constitutional: Denies fevers, chills or abnormal weight loss Eyes: Denies blurriness of vision Ears, nose, mouth, throat, and face: Denies mucositis or sore throat Respiratory: Denies cough, dyspnea or wheezes Cardiovascular: Denies palpitation, chest discomfort or lower extremity swelling Gastrointestinal:  Denies nausea, heartburn or change in bowel habits Skin: Denies abnormal skin rashes Lymphatics: Denies new lymphadenopathy or easy bruising Neurological:Denies numbness, tingling or new weaknesses Behavioral/Psych: Mood is stable, no new changes  All other systems were reviewed with the patient and are negative.  I have reviewed the past medical history, past surgical history, social history and family history with the patient and they are unchanged from previous note.  ALLERGIES:  is allergic to oxycodone.  MEDICATIONS:  Current Outpatient Medications  Medication Sig Dispense Refill  . acetaminophen (TYLENOL) 325 MG tablet Take 2 tablets (650 mg total) by mouth every 6 (six) hours as needed for mild pain, fever or headache.    . lidocaine-prilocaine (EMLA) cream Apply 1 application topically as needed. 30 g 0  . metoprolol succinate (TOPROL-XL) 25 MG 24 hr tablet TK 1 T PO BID  0  . mirtazapine (REMERON) 30 MG tablet Take 1 tablet (30 mg total) by mouth at bedtime. 90 tablet 1   No current facility-administered medications for this visit.     PHYSICAL EXAMINATION: ECOG PERFORMANCE STATUS: 1 - Symptomatic but completely ambulatory  Vitals:   05/13/18 1315  BP: Marland Kitchen)  145/80  Pulse: (!) 107  Resp: 20  Temp: 98.3 F (36.8 C)  SpO2: 97%   Filed Weights   05/13/18 1315  Weight: 104 lb 12.8 oz (47.5 kg)    GENERAL:alert, no distress and comfortable SKIN: skin color, texture, turgor are normal, no rashes or significant lesions EYES: normal, Conjunctiva are pink and non-injected, sclera clear OROPHARYNX:no exudate, no  erythema and lips, buccal mucosa, and tongue normal  NECK: supple, thyroid normal size, non-tender, without nodularity LYMPH:  no palpable lymphadenopathy in the cervical, axillary or inguinal LUNGS: clear to auscultation and percussion with normal breathing effort HEART: regular rate & rhythm and no murmurs and no lower extremity edema ABDOMEN:abdomen soft, non-tender and normal bowel sounds.  Her surgical incision looks a little red.  I put gauze over it.  Drain in situ Musculoskeletal:no cyanosis of digits and no clubbing  NEURO: alert & oriented x 3 with fluent speech, no focal motor/sensory deficits  LABORATORY DATA:  I have reviewed the data as listed    Component Value Date/Time   NA 138 05/13/2018 1124   K 3.1 (L) 05/13/2018 1124   CL 99 05/13/2018 1124   CO2 27 05/13/2018 1124   GLUCOSE 108 (H) 05/13/2018 1124   BUN 12 05/13/2018 1124   CREATININE 0.90 05/13/2018 1124   CALCIUM 9.6 05/13/2018 1124   PROT 8.5 (H) 05/13/2018 1124   ALBUMIN 3.5 05/13/2018 1124   AST 24 05/13/2018 1124   ALT 14 05/13/2018 1124   ALKPHOS 84 05/13/2018 1124   BILITOT 0.7 05/13/2018 1124   GFRNONAA >60 05/13/2018 1124   GFRAA >60 05/13/2018 1124    No results found for: SPEP, UPEP  Lab Results  Component Value Date   WBC 7.9 05/13/2018   NEUTROABS 4.5 05/13/2018   HGB 10.0 (L) 05/13/2018   HCT 32.7 (L) 05/13/2018   MCV 69.0 (L) 05/13/2018   PLT 217 05/13/2018      Chemistry      Component Value Date/Time   NA 138 05/13/2018 1124   K 3.1 (L) 05/13/2018 1124   CL 99 05/13/2018 1124   CO2 27 05/13/2018 1124   BUN 12 05/13/2018 1124   CREATININE 0.90 05/13/2018 1124      Component Value Date/Time   CALCIUM 9.6 05/13/2018 1124   ALKPHOS 84 05/13/2018 1124   AST 24 05/13/2018 1124   ALT 14 05/13/2018 1124   BILITOT 0.7 05/13/2018 1124       RADIOGRAPHIC STUDIES: I have personally reviewed the radiological images as listed and agreed with the findings in the report. Ct  Abdomen Pelvis W Contrast  Result Date: 05/10/2018 CLINICAL DATA:  72 year old with history of peritoneal adenocarcinoma and left pelvic sidewall abscess collection. Percutaneous drain placed on 04/18/2018. History of fistula connection between the abscess collection and adjacent colon. Follow-up examination. EXAM: CT ABDOMEN AND PELVIS WITH CONTRAST TECHNIQUE: Multidetector CT imaging of the abdomen and pelvis was performed using the standard protocol following bolus administration of intravenous contrast. CONTRAST:  29m ISOVUE-300 IOPAMIDOL (ISOVUE-300) INJECTION 61% COMPARISON:  CT 04/26/2018 FINDINGS: Lower chest: Chronic scarring at the right lung base. No pleural effusions. Hepatobiliary: Normal appearance of the liver, gallbladder and portal venous system. No biliary dilatation. Pancreas: Unremarkable. No pancreatic ductal dilatation or surrounding inflammatory changes. Spleen: Trace perisplenic fluid appears similar to the prior examination. Otherwise, normal appearance of the spleen. Adrenals/Urinary Tract: Normal adrenal glands. Again noted is a cyst in the left kidney lower pole. Negative for hydronephrosis. No suspicious renal  lesions. Urinary bladder is decompressed. Stomach/Bowel: Again noted is extensive diverticulosis in the sigmoid colon but no acute inflammatory changes around the colon. Normal appearance of stomach and small bowel. No evidence for bowel dilatation or obstruction. Vascular/Lymphatic: Again noted is a focal saccular aneurysm at the aortic hiatus measuring up to 3.8 cm and stable. Atherosclerotic disease in the aorta and visceral arteries. 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 No lymph node enlargement in the abdomen or pelvis. Reproductive: Status post hysterectomy. No adnexal masses. Other: Again noted is a percutaneous drain along the left side of the pelvis extending from an anterior approach. Drain has minimally changed in  position. There is no fluid collection associated this drain but there continues to be small amount of gas along the medial side of the drain which is probably associated with the adjacent colon and a fistula tract. No significant inflammatory changes in this area. Previously, there was a gas-filled track in the central pelvis related to the old surgical drain but this has become less conspicuous or resolved. No free fluid in the pelvis. No new fluid or abscess collections. Irregularity along the anterior abdominal wall compatible with prior surgical changes. Musculoskeletal: No acute bone abnormality. IMPRESSION: 1. Left pelvic sidewall abscess collection remains decompressed with the percutaneous drain. No residual fluid collection. There continues to be a small amount of gas adjacent to the drain and likely related to a fistula tract. Patient is scheduled for a drain injection. 2. No new abscess or fluid collections. Again noted is trace perisplenic fluid. 3. 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 Aneurysm measures up to 3.8 cm. Aortic aneurysm NOS (ICD10-I71.9). Aortic Atherosclerosis (ICD10-I70.0). Electronically Signed   By: Markus Daft M.D.   On: 05/10/2018 14:58   Ct Abdomen Pelvis W Contrast  Result Date: 04/26/2018 CLINICAL DATA:  72 year old with peritoneal adenocarcinoma and left pelvic sidewall abscess collection. Percutaneous drain was placed in the left pelvic abscess on 04/18/2018. Patient presents for follow-up. Patient reports minimal output from the drain. A fistula connection between the abscess collection and the adjacent colon was identified during the tube placement. EXAM: CT ABDOMEN AND PELVIS WITH CONTRAST TECHNIQUE: Multidetector CT imaging of the abdomen and pelvis was performed using the standard protocol following bolus administration of intravenous contrast. CONTRAST:  132m ISOVUE-300 IOPAMIDOL (ISOVUE-300) INJECTION 61% COMPARISON:  04/18/2018 and 04/07/2018 FINDINGS:  Lower chest: Stable scarring along the posterior right lower lobe. Otherwise, the lung bases are clear without pleural effusions. Cardiac loop recorder in the left anterior chest subcutaneous tissues. Calcifications involving the visualized thoracic aorta. No significant pericardial fluid. Hepatobiliary: Gallbladder has an elongated configuration but this is unchanged and no evidence for acute inflammatory changes. Normal appearance of the liver without focal lesion and no biliary dilatation. Main portal venous system is patent. Distal common bile duct measures roughly 0.6 cm. Pancreas: Unremarkable. No pancreatic ductal dilatation or surrounding inflammatory changes. Spleen: Normal in size without focal abnormality. Adrenals/Urinary Tract: Normal adrenal glands. Normal appearance of both kidneys without suspicious renal lesions and no hydronephrosis. There is a low-density structure in the left kidney lower pole that measures 2.2 cm and suggestive for a cortical cyst. Urinary bladder is decompressed and difficult to evaluate. Stomach/Bowel: Mild wall thickening in the distal stomach is similar to the previous examination. Duodenum and small bowel are unremarkable. High-density material within diverticula in the sigmoid colon. No acute inflammatory changes involving the colon. Vascular/Lymphatic: Again noted is focal saccular aneurysm  at the aortic hiatus measuring up to 3.8 cm and minimally changed. Atherosclerotic calcifications involving the visceral arteries. No aneurysm involving the infrarenal abdominal aorta. Iliac arteries are heavily calcified. 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 Proximal femoral arteries are patent bilaterally. Venous structures are unremarkable. Reproductive: Status post hysterectomy. No adnexal masses. Other: Previously, there was a surgical drain in the left lower quadrant and pelvis. There is still a gas tract in the left hemipelvis  associated with the old drain tract. However, the amount of gas from the old drain tract has decreased since 04/18/2018. The complex air-fluid collection along the left pelvic sidewall has been decompressed with the percutaneous drain. Drain enters just medial and superior to the left common femoral vessels. There is no longer a discrete abscess collection along the left pelvic sidewall but there continues to be soft tissue thickening and multiple small pockets of gas between the decompressed pocket and the old surgical drain tract. The small fluid collections within the left pelvic collection have resolved. No new fluid or abscess collections in the abdomen or pelvis. Soft tissue changes along the left anterior abdominal wall and subcutaneous tissues are related to previous surgery and old drain site. No free fluid in the abdomen or pelvis. Musculoskeletal: No acute bone abnormality. Degenerative facet disease in the lumbar spine. Vertebral body heights are maintained in the visualized spine. IMPRESSION: 1. Left pelvic sidewall abscess collection is decompressed with the percutaneous drain. No residual fluid. There continues to be soft tissue thickening and small pockets of gas in this area. Findings concerning for a residual colonic fistula tract but this will be further characterized with drain injection with fluoroscopy. No new abscess or fluid collections. 2. Persistent gas in the left pelvis related to the old surgical drain site. However, this gas-filled cavity is decreasing in size compared to 04/18/2018. 3. 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 Aneurysm measures up to 3.8 cm. Aortic aneurysm NOS (ICD10-I71.9). Aortic Atherosclerosis (ICD10-I70.0). 4. 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 Electronically Signed   By: Markus Daft M.D.   On: 04/26/2018 10:42   Dg Sinus/fist Tube Chk-non Gi  Result Date: 05/10/2018 INDICATION: 72 year old with history of left pelvic  abscess and known colonic fistula. CT of the abdomen and pelvis from today demonstrates that the abscess collection has resolved but there is still concern for a fistula tract. Plan for drain injection with fluoroscopy. EXAM: ABSCESS DRAIN INJECTION MEDICATIONS: None ANESTHESIA/SEDATION: None FLUOROSCOPY TIME:  30 seconds, 11 mGy CONTRAST:  10 mL Omnipaque 563 COMPLICATIONS: None immediate. PROCEDURE: Patient was placed supine. Drain was injected under fluoroscopy. The gravity bag was switch for a bulb at the patient's request. FINDINGS: Drain remains along the left side of the pelvis. Contrast in the urinary bladder related to the recent CT. There are small pockets just medial to the drain that fill with contrast. There is immediate filling of a fistula tract that extends cephalad from the drain to the sigmoid colon. Fistula tract does appear to be smaller than the prior examination. Contrast fills the sigmoid colon. Delayed images demonstrates contrast refluxing into the descending colon. IMPRESSION: Persistent fistula tract between the decompressed abscess cavity and the sigmoid colon. The fistula tract remains widely patent but smaller than it was on 04/26/2018. The drain was kept in place. Instructed the patient to flush the tube every other day. Patient requested a suction bulb but we instructed the patient  NOT to use suction on this bulb. Patient will follow-up for a drain injection in approximately 2 weeks. Electronically Signed   By: Markus Daft M.D.   On: 05/10/2018 16:58   Dg Sinus/fist Tube Chk-non Gi  Addendum Date: 04/26/2018   ADDENDUM REPORT: 04/26/2018 16:40 ADDENDUM: Fluoroscopy time: 24 seconds, 12 mGy Electronically Signed   By: Markus Daft M.D.   On: 04/26/2018 16:40   Result Date: 04/26/2018 INDICATION: 72 year old with left pelvic abscess collection and known colonic fistula. Patient presents for follow-up. Abscess collection has essentially resolved on CT and patient reports  approximately 15 mL of output from the drain each day. EXAM: ABSCESS DRAIN INJECTION WITH FLUOROSCOPY MEDICATIONS: None ANESTHESIA/SEDATION: None COMPLICATIONS: None immediate. PROCEDURE: Patient was placed supine on the fluoroscopic table. The drain was injected with 7 mL of Omnipaque 300. Drain was flushed with normal saline at the end of the procedure. Drain was attached to a gravity bag. FINDINGS: Drain injection demonstrates intermediate filling of a tract between the drain and the adjacent sigmoid colon. Contrast also fills a few irregular small collections adjacent to the drain but there is no large abscess cavity. IMPRESSION: Positive for a fistula tract between the percutaneous drain and the sigmoid colon. No significant abscess cavity. Electronically Signed: By: Markus Daft M.D. 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 Findings are concerning for an abscess with air bowel fistula. Plan for CT-guided aspiration and drain placement. EXAM: CT-GUIDED DRAIN PLACEMENT WITHIN THE LEFT COLLECTION MEDICATIONS: No antibiotics were given for this procedure. ANESTHESIA/SEDATION: Fentanyl 100 mcg IV; Versed 2 mg IV Moderate Sedation Time:  47 minutes The patient was continuously monitored during the procedure by the interventional radiology nurse under my direct supervision. COMPLICATIONS: None immediate. PROCEDURE: Informed written consent was obtained from the patient after a thorough discussion of the procedural risks, benefits and alternatives. All questions were addressed. A timeout was performed prior to the initiation of the procedure. Patient was placed supine on the CT scanner. Images through the pelvis were obtained. The predominantly gas-filled collection along the left pelvic sidewall was identified. The left lower pelvis was prepped  and draped in a sterile fashion. Skin and soft tissues were anesthetized with 1% lidocaine. 14 gauge trocar needle was directed just medial to the left femoral vessels and just below the left inferior epigastric vessels. Needle was directed into the gas-filled collection with CT guidance. No fluid could be aspirated from the needle. Stiff Amplatz wire was advanced into the collection. Tract was dilated to accommodate a 10 [place] drain. Approximately 1 mL of thick bloody fluid was aspirated. Contrast was injected through the drain to evaluate for a bowel fistula. Catheter was flushed with saline and attached to a suction bulb. Catheter was sutured to sin and a StatLock was placed. FINDINGS: Irregular gas-filled collection along the left pelvic sidewall. Only 1 ml of thick bloody fluid could be aspirated. Contrast injection demonstrated filling of the adjacent sigmoid colon and findings compatible with a colonic fistula. IMPRESSION: CT-guided placement of a drainage catheter within the left pelvic sidewall collection. The collection contains mostly gas and only 1 mL bloody fluid could be removed. The fluid was sent for culture. 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 Will schedule for follow-up in the IR clinic in 1 week with a CT and drain injection. Electronically Signed  By: Markus Daft M.D.   On: 04/18/2018 17:54   Ir Radiologist Eval & Mgmt  Result Date: 05/10/2018 Please refer to notes tab for details about interventional procedure. (Op Note)  Ir Radiologist Eval & Mgmt  Result Date: 04/26/2018 Please refer to notes tab for details about interventional procedure. (Op Note)   All questions were answered. The patient knows to call the clinic with any problems, questions or concerns. No barriers to learning was detected.  I spent 15 minutes counseling the patient face to face. The total time spent in the appointment was 20 minutes and more than 50% was on counseling and  review of test results  [name], MD 05/13/2018 2:52 PM

## 2018-05-13 NOTE — Progress Notes (Signed)
Sylva has an appointment with Dr. Marcello Moores at [facility] on Monday, 05/16/2018.  Also verified with her that follow up appointment on 05/26/18 with Dr. Skeet Latch is OK.

## 2018-05-13 NOTE — Assessment & Plan Note (Signed)
She has chronic hypomagnesemia, likely due to poor oral intake and chronic GI loss She will continue IV magnesium replacement therapy I plan to recheck her blood work again in 2 weeks

## 2018-05-13 NOTE — Assessment & Plan Note (Signed)
She has multifactorial anemia She is not symptomatic Observe only 

## 2018-05-13 NOTE — Telephone Encounter (Signed)
Gave avs and calendar ° °

## 2018-05-13 NOTE — Telephone Encounter (Signed)
Called and left a message for Oswaldo Done, RN with [facility]. Left order to add 2 grams of Magnesium to daily IV fluids until she is seen back in the office on 12/19.

## 2018-05-25 ENCOUNTER — Other Ambulatory Visit (HOSPITAL_COMMUNITY): Payer: Self-pay | Admitting: Interventional Radiology

## 2018-05-25 ENCOUNTER — Ambulatory Visit
Admission: RE | Admit: 2018-05-25 | Discharge: 2018-05-25 | Disposition: A | Discharge status: Home / Self Care | Payer: Medicare PPO | Source: Ambulatory Visit | Attending: Gynecologic Oncology | Admitting: Gynecologic Oncology

## 2018-05-25 ENCOUNTER — Encounter: Payer: Self-pay | Admitting: Radiology

## 2018-05-25 DIAGNOSIS — N739 Female pelvic inflammatory disease, unspecified: Secondary | ICD-10-CM

## 2018-05-25 HISTORY — PX: IR RADIOLOGIST EVAL & MGMT: IMG5224

## 2018-05-25 NOTE — Progress Notes (Signed)
Referring Physician(s): Brewster,Wendy Dr Joyice Faster  Chief Complaint: The patient is seen in follow up today s/p 04/18/18 left pelvic wall abscess drain placement in IR   History of present illness:  Hx Peritoneal carcinomatosis Chemotherapy and surgery with robotic assisted bilateral salpingo-oophorectomy and omentectomy.   Post op abscess  Last visit 05/10/18:  follow-up imaging has demonstrated resolution of the pelvic abscess but persistent fistula tract to the adjacent colon.  Today pt is here for follow up drain injection only. Denies fever/chills No pain Little OP She has appt with Dr Marcello Moores Jan 3rd   Past Medical History:  Diagnosis Date  . Acute on chronic respiratory failure with hypoxia ([place]) 03/07/2018  . Anxiety and depression   . Aortic aneurysm ([place])    see ov note 03/2017   . Arthritis   . Carcinomatosis ([place]) 10/2017  . COPD (chronic obstructive pulmonary disease) ([place]) 10/23/2017  . Depression   . Family history of breast cancer   . Family history of prostate cancer   . Family history of uterine cancer   . History of diverticulitis   . HLD (hyperlipidemia) 10/23/2017  . Hypercholesterolemia   . Hypertension   . Mitral valve prolapse   . Sleep apnea    cpap machine mask and tubing in [place] per patient.  Patient does not have mask or tubing to bring on DOS of 01/27/2018.   Marland Kitchen Uterine cancer ([place])    had partial hysterectomy at 27    Past Surgical History:  Procedure Laterality Date  . ABDOMINAL HYSTERECTOMY    . cardiac monitor implant    . CEREBRAL ANEURYSM REPAIR    . CERVICAL SPINE SURGERY     bone spurs  . DEBULKING N/A 01/27/2018   Procedure: RADICAL DEBULKING;  Surgeon: [name], MD;  Location: [facility];  Service: Gynecology;  Laterality: N/A;  . IR FLUORO GUIDE PORT INSERTION RIGHT  10/14/2017  . IR PARACENTESIS  10/14/2017  . IR RADIOLOGIST EVAL & MGMT  04/26/2018  . IR RADIOLOGIST EVAL & MGMT  05/10/2018  . IR RADIOLOGIST EVAL &  MGMT  05/25/2018  . IR US GUIDE VASC ACCESS RIGHT  10/14/2017  . LAPAROTOMY N/A 01/27/2018   Procedure: EXPLORATORY LAPAROTOMY WITH REMOVAL OF BILATERAL TUBES AND OVARIES;  Surgeon: [name], MD;  Location: [facility];  Service: Gynecology;  Laterality: N/A;  . LAPAROTOMY N/A 01/31/2018   Procedure: EXPLORATORY LAPAROTOMY, ABDOMINAL WASHOUT;  Surgeon: Isabel Caprice, MD;  Location: [facility];  Service: Gynecology;  Laterality: N/A;  . OMENTECTOMY N/A 01/27/2018   Procedure: OMENTECTOMY;  Surgeon: [name], MD;  Location: [facility];  Service: Gynecology;  Laterality: N/A;  . PARTIAL HYSTERECTOMY  1971   "precancer"    Allergies: Oxycodone  Medications: Prior to Admission medications   Medication Sig Start Date End Date Taking? Authorizing Provider  acetaminophen (TYLENOL) 325 MG tablet Take 2 tablets (650 mg total) by mouth every 6 (six) hours as needed for mild pain, fever or headache. 03/04/18   Cross, Lenna Sciara D, NP  lidocaine-prilocaine (EMLA) cream Apply 1 application topically as needed. 03/31/18   [name], MD  metoprolol succinate (TOPROL-XL) 25 MG 24 hr tablet TK 1 T PO BID 03/23/18   [provider]  mirtazapine (REMERON) 30 MG tablet Take 1 tablet (30 mg total) by mouth at bedtime. 05/13/18   [name], MD     Family History  Problem Relation Age of Onset  . Endometrial cancer Sister 22  endometrial ca  . Endometrial cancer Paternal Aunt 80  . Breast cancer Paternal Aunt   . Stomach cancer Maternal Grandfather        d. 3s  . Prostate cancer Paternal Uncle   . Breast cancer Cousin        pat first cousin  . Breast cancer Cousin        pat first cousin's daughter  . Lung cancer Father        lung ca  . Heart disease Mother   . Stroke Paternal Grandmother   . Endometrial cancer Cousin        paternal first cousin's daughter  . Breast cancer Other        niece dx less than 56s  . Breast cancer Other        niece, dx under 37    Social History     Socioeconomic History  . Marital status: Married    Spouse name: Not on file  . Number of children: 3  . Years of education: Not on file  . Highest education level: Not on file  Occupational History  . Occupation: retired  Scientific laboratory technician  . Financial resource strain: Not on file  . Food insecurity:    Worry: Not on file    Inability: Not on file  . Transportation needs:    Medical: Not on file    Non-medical: Not on file  Tobacco Use  . Smoking status: Former Smoker    Packs/day: 1.00    Years: 55.00    Pack years: 55.00    Last attempt to quit: 10/06/2017    Years since quitting: 0.6  . Smokeless tobacco: Never Used  Substance and Sexual Activity  . Alcohol use: Never    Frequency: Never  . Drug use: Never  . Sexual activity: Not Currently  Lifestyle  . Physical activity:    Days per week: Not on file    Minutes per session: Not on file  . Stress: Not on file  Relationships  . Social connections:    Talks on phone: Not on file    Gets together: Not on file    Attends religious service: Not on file    Active member of club or organization: Not on file    Attends meetings of clubs or organizations: Not on file    Relationship status: Not on file  Other Topics Concern  . Not on file  Social History Narrative  . Not on file     Vital Signs: There were no vitals taken for this visit.  Physical Exam Skin:    General: Skin is warm and dry.     Comments: Site is clean and dry No bleeding No redness    Drain injection reveals persistent fistulous connection to bowel   Imaging: Ir Radiologist Eval & Mgmt  Result Date: 05/25/2018 Please refer to notes tab for details about interventional procedure. (Op Note)   Labs:  CBC: Recent Labs    04/21/18 1357 04/29/18 1351 05/10/18 1237 05/13/18 1124  WBC 6.3 6.6 6.2 7.9  HGB 9.3* 9.9* 9.6* 10.0*  HCT 31.2* 32.6* 31.8* 32.7*  PLT 318 248 213 217    COAGS: Recent Labs    10/08/17 0710   01/31/18 0850 02/17/18 0500 02/17/18 0838 03/05/18 0704 04/18/18 0742  INR 1.22   < > 1.36 1.28 1.28 1.24 1.24  APTT 32  --  36  --   --   --   --    < > =  values in this interval not displayed.    BMP: Recent Labs    04/21/18 1357 04/29/18 1351 05/10/18 1237 05/13/18 1124  NA 143 141 143 138  K 3.5 3.1* 3.1* 3.1*  CL 105 104 104 99  CO2 27 27 30 27   GLUCOSE 96 131* 109* 108*  BUN 9 14 25* 12  CALCIUM 9.3 9.3 9.6 9.6  CREATININE 0.74 0.83 0.82 0.90  GFRNONAA >60 >60 >60 >60  GFRAA >60 >60 >60 >60    LIVER FUNCTION TESTS: Recent Labs    04/21/18 1357 04/29/18 1351 05/10/18 1237 05/13/18 1124  BILITOT 0.3 0.4 0.4 0.7  AST 28 23 27 24   ALT 16 11 17 14   ALKPHOS 79 79 82 84  PROT 8.5* 8.4* 8.4* 8.5*  ALBUMIN 3.1* 3.3* 3.5 3.5    Assessment:  Left pelvic wall abscess drain 04/18/18 No change in fistulous connection to bowel per drain injection today per Dr Earleen Newport Rec: drain exchange to be done at [facility] when can Pt is leaving town for Christmas vacation in am She will return Jan 3 Appt has been made for drain exchange Jan 6 at [facility] She and grand daughter are agreeable (she will call Dr Marcello Moores to see if she needs to keep Jan 3rd appt or wait)   Signed: Lavonia Drafts, PA-C 05/25/2018, 2:05 PM   Please refer to Dr. Earleen Newport attestation of this note for management and plan.

## 2018-05-25 NOTE — Progress Notes (Signed)
GYN ONCOLOGY OFFICE VISIT  Referring Clinician:  Dr. Kayleen Memos  CC:  Pelvic fluid collection  HPI: [name]  is a very nice 72 y.o.  P3  March 2019 she had a abdominal ultrasound for her history of aortic aneurysm there was an incidental finding of a cirrhotic appearing liver and ascites.  Initially treated with diuretics but after lack of improvement further work-up was performed including a CT scan of the abdomen and pelvis.  As noted below etiology was significant carcinomatosis with peritoneal thickening sigmoid colon with significant thickening new pancreatic cyst compared to a 2018 scan and omental caking.  Seen by medical oncology [place] which is where she lives.  Paracentesis was ordered along with tumor markers including CA 125 which was noted to be elevated CA-19-9 which was mildly elevated normal being 35 the cytology on the paracentesis was positive for adenocarcinoma IHC staining suggested a GYN primary CK 7 and Ca1 25 being positive then CEA CK 20 and CA-19-9 being negative.  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 CK20, CEA and  Ca19-9 were negative    09/16/2017 Imaging    CT abdomen and pelvis elsewhere showed small bilateral pleural effusion, cardiomegaly, 2.6 cm ovoid cystic mass at the pancreatic tail, 3.5 cm saccular aneurysm, no retroperitoneal lymphadenopathy, marked sigmoid wall thickening without overt mechanical bowel obstruction, marked omental thickening and ascites.    10/07/2017 Genetic Testing    BRCA2 c.5073dupA (p.Trp1692Metfs*3) pathogenic mutation and APC c.7088A>C (p.Lys2363Arg), POLE c.1007A>G (p.Asn36Ser) and RAD51D c.434G>A (p.Arg145His) VUS identified the [facility] panel.  The [facility] gene panel offered by [employer] includes sequencing and deletion/duplication testing of the following 35 genes: APC, ATM, AXIN2, BARD1, BMPR1A, BRCA1, BRCA2, BRIP1, CHD1, CDK4, CDKN2A, CHEK2, EPCAM (large rearrangement only), HOXB13, (sequencing only), GALNT12, MLH1, MSH2, MSH3 (excluding repetitive portions of exon 1), MSH6, MUTYH, NBN, NTHL1, PALB2, PMS2, PTEN, RAD51C, RAD51D, RNF43, RPS20, SMAD4, STK11, and TP53. Sequencing was performed for select regions of POLE and POLD1, and large rearrangement analysis was performed for select regions of GREM1. The report date is Oct 07, 2017.    10/08/2017 Procedure    Extensive omental and peritoneal tumor is present anteriorly deep to the abdominal wall. From a midline approach, tumor was sampled just to the left of midline in the upper pelvis, below the level of the umbilicus. Solid tissue was obtained.  IMPRESSION: CT-guided core biopsy performed of omental/peritoneal tumor in the anterior peritoneal cavity.    10/08/2017 Pathology Results    Soft Tissue Needle Core Biopsy, Omentum/Peritoneal Cavity - POORLY DIFFERENTIATED CARCINOMA. Microscopic Comment Immunohistochemistry will be performed and reported as an addendum. Dr.  Smir agrees. Called to [employer] on 10/11/17. (JDP:kh 10/11/17) ADDENDUM: Immunohistochemistry shows the tumor is positive with cytokeratin  AE1/AE3, cytokeratin 7, estrogen receptor, WT-1, and p16. The tumor is negative with CD56, chromogranin, synaptophysin, CDX-2, cytokeratin 20, progesterone receptor, GATA-3, GCDFP, Napsin-A and thyroid transcription factor-1. 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 The catheter is ready for use.    10/14/2017 Procedure    A total of approximately 1300 mL of ascitic fluid was removed.    10/15/2017 Tumor Marker    Patient's tumor was tested for the following markers: CA-125 Results of the tumor marker test revealed 775.9    10/18/2017 -  Chemotherapy    The patient had carboplatin and Taxol    10/22/2017 - 10/27/2017 Hospital Admission    She was admitted to the hospital for evaluation of shortness of breath and was found to have large pleural effusion, successfully removed and cytology was malignant.  Sepsis was ruled out.    10/23/2017 Imaging    No evidence of pulmonary embolism.  New moderate size left effusion and new small to moderate right effusion with associated bibasilar atelectasis.  Known extensive peritoneal carcinomatosis and moderate ascites.  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 Recommend correlation with prior exams and vascular surgery consultation.  2.1 cm left renal cyst.  Mild cardiomegaly and minimal atherosclerotic coronary artery disease.  Aortic Atherosclerosis (ICD10-I70.0) and Emphysema (ICD10-J43.9).    10/24/2017 Pathology Results    PLEURAL FLUID, LEFT (SPECIMEN 1 OF 1 COLLECTED 10/24/17): MALIGNANT CELLS CONSISTENT WITH METASTATIC ADENOCARCINOMA. SEE  COMMENT.    10/24/2017 Procedure    Successful ultrasound guided left thoracentesis yielding 580 mL of pleural fluid.  Follow-up chest radiograph shows no pneumothorax    11/03/2017 Tumor Marker    Patient's tumor was tested for the following markers: CA-125 Results of the tumor marker test revealed 991.9    11/29/2017 Tumor Marker    Patient's tumor was tested for the following markers: CA-125 Results of the tumor marker test revealed 133    12/14/2017 Imaging    Decreased peritoneal carcinomatosis, and near complete resolution of ascites since prior exam.  No new or progressive disease within the abdomen or pelvis.  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  Skeet Latch, M.D. 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  Miliary disease of the bilateral diaphragmatic surfaces and also the mesentery of the large and small bowel.  Left adnexa densely adherent to the distal rectosigmoid colon.  Right adnexa adherent to the pelvic  sidewall.  Multiple loops of bowel adherent to miliary disease in the vaginal cuff.  At the completion of procedure the patient was R1 disease status     01/27/2018 Pathology Results    1. Omentum, resection for tumor, infragastric - HIGH GRADE SEROUS CARCINOMA, >2 CM. - SEE ONCOLOGY TABLE. 2. Soft tissue mass, simple excision, abdominal wall nodule - HIGH GRADE SEROUS CARCINOMA, >2 CM. - SEE ONCOLOGY TABLE. 3. Ovary and fallopian tube, left - OVARY AND FALLOPIAN TUBE WITH SURFACE SEROUS CARCINOMA. 4. Ovary and fallopian tube, right - OVARY AND FALLOPIAN TUBE WITH SURFACE SEROUS CARCINOMA. Microscopic Comment 1. OVARY or FALLOPIAN TUBE or PRIMARY PERITONEUM: Procedure: Omentectomy, soft tissue excision, bilateral salpingo-oophorectomy. Specimen Integrity: Intact. Tumor Site: See comment. Ovarian Surface Involvement (required only if applicable): Present, bilateral. Fallopian Tube Surface Involvement (required only if applicable): Present, bilateral. Tumor Size: >2 cm, see comment. Histologic Type: High grade serous carcinoma. Histologic Grade: High grade. Implants (required for advanced stage serous/seromucinous borderline tumors only): See comment. Other Tissue/ Organ Involvement: Bilateral fallopian tubes, bilateral ovaries, omentum, peritoneum. Largest Extrapelvic Peritoneal Focus (required only if applicable): >2 cm. Peritoneal/Ascitic Fluid: N/A. Pleural effusion ([id number]) positive. Treatment Effect (required only for high-grade serous carcinomas): CRS-1 Regional Lymph Nodes: No lymph nodes submitted or found Pathologic Stage Classification (pTNM, AJCC 8th Edition): ypT3c, ypNX, pM1a Representative Tumor Block: 1A-B Comment(s): The omentum is largely infiltrated by tumor (>2 cm). Both ovaries and fallopian tubes were entirely submitted. A distinct precursor lesion is not identified, only surface deposits. While it is not recommended to diagnose a primary peritoneal carcinoma  after treatment, it is assumed the likely primary given the lack of precursor lesion or significant disease in the ovaries and fallopian tubes.    01/27/2018 - 03/23/2018 Hospital Admission    She was admitted to the hospital for interval debulking surgery, complicated by sepsis and respiratory failure.    03/08/2018 Imaging    7.0 x 1.3 cm left pelvic fluid collection is noted with air-fluid level and enhancing margins, most consistent with abscess. It is slightly smaller compared to prior exam.  Stable position of surgical drainage catheter is seen in the pelvis.  Sigmoid diverticulosis is noted without inflammation.  3.9 cm aortic aneurysm is noted at aortic hiatus. Recommend followup by [name] in 2 years. This recommendation follows ACR consensus guidelines: White Paper of the ACR Incidental Findings Committee II on Vascular Findings. Joellyn Rued Radiol 2013; [phone].     03/29/2018 Tumor Marker    Patient's tumor was tested for the following markers: CA-125 Results of the tumor marker test revealed 35.3    04/07/2018 Imaging    CT abdomen and pelvis Persistent rim enhancing fluid and gas collection in the left pelvic sidewall measuring 7.2 x 3.0 cm, and not significantly changed compared to prior study. Surgical drain remains in place in the central pelvis.  Sigmoid colon diverticulosis, with muscular hypertrophy versus mild diverticulitis.  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 Consider vascular surgery referral/consultation if not already obtained.    04/18/2018 Imaging    CT-guided placement of a drainage catheter within the left pelvic sidewall collection. The collection contains mostly gas and only 1 mL bloody fluid could be removed. The  fluid was sent for culture.  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  Will schedule for follow-up in the IR clinic in 1 week with a CT and drain injection.     04/26/2018 Imaging    CT abdomen and pelvis 1. Left pelvic sidewall abscess collection is decompressed with the percutaneous drain. No residual fluid. There continues to be soft tissue thickening and small pockets of gas in this area. Findings concerning for a residual colonic fistula tract but this will be further characterized with drain injection with fluoroscopy. No new abscess or fluid collections. 2. Persistent gas in the left pelvis related to the old surgical drain site. However, this gas-filled cavity is decreasing in size compared to 04/18/2018. 3. 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 Aneurysm measures up to 3.8 cm. Aortic aneurysm NOS (ICD10-I71.9). Aortic Atherosclerosis (ICD10-I70.0).  4. 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    04/26/2018 Procedure    Positive for a fistula tract between the percutaneous drain and the sigmoid colon.  No significant abscess cavity.     05/10/2018 Tumor Marker    Patient's tumor was tested for the following markers: CA-125 Results of the tumor marker test revealed 18.8    05/10/2018 Imaging    1. Left pelvic sidewall abscess collection remains decompressed with the percutaneous drain. No residual fluid collection. There continues to be a small amount of gas adjacent to the drain and likely related to a fistula tract. Patient is scheduled for a drain injection. 2. No new abscess or fluid collections. Again noted is trace perisplenic fluid. 3. 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 Aneurysm measures up to 3.8 cm. Aortic aneurysm NOS (ICD10-I71.9). Aortic Atherosclerosis (ICD10-I70.0).    05/10/2018 Procedure    Persistent fistula tract between the decompressed abscess cavity and the sigmoid colon. The fistula tract remains widely patent but smaller than it was on 04/26/2018.  The drain was kept in place. Instructed the patient to flush the tube every other day. Patient requested a suction bulb but we  instructed the patient NOT to use suction on this bulb. Patient will follow-up for a drain injection in approximately 2 weeks.    Ramya Vanbergen reports weight gain, denies abdominal pain, no SOB, normal BM, fair appetite, no vaginal bleeding   Current Meds:  Outpatient Encounter Medications as of 05/26/2018  Medication Sig  . acetaminophen (TYLENOL) 325 MG tablet Take 2 tablets (650 mg total) by mouth every 6 (six) hours as needed for mild pain, fever or headache.  . lidocaine-prilocaine (EMLA) cream Apply 1 application topically as needed.  . metoprolol succinate (TOPROL-XL) 25 MG 24 hr tablet TK 1 T PO BID  . mirtazapine (REMERON) 30 MG tablet Take 1 tablet (30 mg total) by mouth at bedtime.   No facility-administered encounter medications on file as of 05/26/2018.     Allergy:  Allergies  Allergen Reactions  . Oxycodone     confusion    Social Hx:   Social History   Socioeconomic History  . Marital status: Married    Spouse name: Not on file  . Number of children: 3  . Years of education: Not on file  . Highest education level: Not on file  Occupational History  . Occupation: retired  Scientific laboratory technician  . Financial resource strain: Not on file  . Food insecurity:    Worry: Not on file    Inability: Not on file  . Transportation needs:  Medical: Not on file    Non-medical: Not on file  Tobacco Use  . Smoking status: Former Smoker    Packs/day: 1.00    Years: 55.00    Pack years: 55.00    Last attempt to quit: 10/06/2017    Years since quitting: 0.6  . Smokeless tobacco: Never Used  Substance and Sexual Activity  . Alcohol use: Never    Frequency: Never  . Drug use: Never  . Sexual activity: Not Currently  Lifestyle  . Physical activity:    Days per week: Not on file    Minutes per session: Not on file  . Stress: Not on file  Relationships  . Social connections:    Talks on phone: Not on file    Gets together: Not on file    Attends religious service: Not  on file    Active member of club or organization: Not on file    Attends meetings of clubs or organizations: Not on file    Relationship status: Not on file  . Intimate partner violence:    Fear of current or ex partner: Not on file    Emotionally abused: Not on file    Physically abused: Not on file    Forced sexual activity: Not on file  Other Topics Concern  . Not on file  Social History Narrative  . Not on file    Past Surgical Hx:  Past Surgical History:  Procedure Laterality Date  . ABDOMINAL HYSTERECTOMY    . cardiac monitor implant    . CEREBRAL ANEURYSM REPAIR    . CERVICAL SPINE SURGERY     bone spurs  . DEBULKING N/A 01/27/2018   Procedure: RADICAL DEBULKING;  Surgeon: [name], MD;  Location: [facility];  Service: Gynecology;  Laterality: N/A;  . IR FLUORO GUIDE PORT INSERTION RIGHT  10/14/2017  . IR PARACENTESIS  10/14/2017  . IR RADIOLOGIST EVAL & MGMT  04/26/2018  . IR RADIOLOGIST EVAL & MGMT  05/10/2018  . IR RADIOLOGIST EVAL & MGMT  05/25/2018  . IR US GUIDE VASC ACCESS RIGHT  10/14/2017  . LAPAROTOMY N/A 01/27/2018   Procedure: EXPLORATORY LAPAROTOMY WITH REMOVAL OF BILATERAL TUBES AND OVARIES;  Surgeon: [name], MD;  Location: [facility];  Service: Gynecology;  Laterality: N/A;  . LAPAROTOMY N/A 01/31/2018   Procedure: EXPLORATORY LAPAROTOMY, ABDOMINAL WASHOUT;  Surgeon: Isabel Caprice, MD;  Location: [facility];  Service: Gynecology;  Laterality: N/A;  . OMENTECTOMY N/A 01/27/2018   Procedure: OMENTECTOMY;  Surgeon: [name], MD;  Location: [facility];  Service: Gynecology;  Laterality: N/A;  . PARTIAL HYSTERECTOMY  1971   "precancer"    Past Medical Hx:  Past Medical History:  Diagnosis Date  . Acute on chronic respiratory failure with hypoxia ([place]) 03/07/2018  . Anxiety and depression   . Aortic aneurysm ([place])    see ov note 03/2017   . Arthritis   . Carcinomatosis ([place]) 10/2017  . COPD (chronic obstructive pulmonary disease) ([place]) 10/23/2017  .  Depression   . Family history of breast cancer   . Family history of prostate cancer   . Family history of uterine cancer   . History of diverticulitis   . HLD (hyperlipidemia) 10/23/2017  . Hypercholesterolemia   . Hypertension   . Mitral valve prolapse   . Sleep apnea    cpap machine mask and tubing in [place] per patient.  Patient does not have mask or tubing to bring on DOS of 01/27/2018.   Marland Kitchen  Uterine cancer ([place])    had partial hysterectomy at 27    Past Gynecological History:   GYNECOLOGIC HISTORY:  No LMP recorded. Patient has had a hysterectomy.  For precancer Menarche: 72 years old P 3 LMP 1977 Contraceptive OCP x8 years HRT none  Last Pap N/A  Family Hx:  Family History  Problem Relation Age of Onset  . Endometrial cancer Sister 72       endometrial ca  . Endometrial cancer Paternal Aunt 80  . Breast cancer Paternal Aunt   . Stomach cancer Maternal Grandfather        d. 22s  . Prostate cancer Paternal Uncle   . Breast cancer Cousin        pat first cousin  . Breast cancer Cousin        pat first cousin's daughter  . Lung cancer Father        lung ca  . Heart disease Mother   . Stroke Paternal Grandmother   . Endometrial cancer Cousin        paternal first cousin's daughter  . Breast cancer Other        niece dx less than 27s  . Breast cancer Other        niece, dx under 70    Review of Systems:  Review of Systems  Constitutional: Positive for fatigue. Negative for appetite change.       Fair appetite  HENT:   Negative for tinnitus and trouble swallowing.   Eyes: Negative.   Respiratory: Negative for chest tightness, cough, shortness of breath and wheezing.   Gastrointestinal: Positive for constipation. Negative for abdominal distention, abdominal pain, nausea, rectal pain and vomiting.  Endocrine: Negative.   Genitourinary: Negative for pelvic pain, vaginal bleeding and vaginal discharge.   Musculoskeletal: Negative for back pain.  Skin:  Negative.   Neurological: Negative.   Hematological: Negative.   Psychiatric/Behavioral: Positive for depression. Negative for confusion. The patient is not nervous/anxious.    Physical Exam: There were no vitals taken for this visit.  General :   72 y.o., female in no apparent distress HEENT:  Normocephalic/atraumatic, symmetric, EOMI, eyelids normal Neck:   Supple, no masses.  Lymphatics:  No cervical/ submandibular/ supraclavicular/ infraclavicular/ inguinal adenopathy Respiratory:  Respirations unlabored, no use of accessory muscles, chest clear to auscultation CV:   Tachycardic regular and rhythm musculoskeletal: No CVA tenderness, normal muscle strength. Abdomen:  Soft, non-tender,  No evidence of hernia. No masses.  No ascites Extremities:  No lymphedema, no erythema, non-tender. Skin:   Normal inspection Neuro/Psych:  No focal motor deficit, no abnormal mental status. Normal gait. Normal affect. Alert and oriented to person, place, and time  Genito Urinary: Vulva: Normal external female genitalia.  Bladder/urethra: Urethral meatus normal in size and location. No lesions or   masses, well supported bladder,  .    Assessment/Plan:: 1. 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  At initial presentation large volume ascites and positive pleural cytology was appreciated in addition to rectal wall nodularity.  Mrs. Skalsky has completed 4 cycles of paclitaxel carboplatin with reduction in Ca1 25-133.  Cycle #4  December 20, 2017.  Underwent robotic assisted bilateral salpingo-oophorectomy omentectomy interval debulking on January 27, 2018.  Postop course complicated by sepsis with suspicion of perforation.  She underwent an exploratory laparotomy however the microperforation could not be identified.  Her postoperative course was very complicated however with the incredible support of the ICU team and her family and  prolonged hospital course she now presents  today.  A drain remains in place.  The last 2 abdominal and pelvic imaging studies have been notable for stable 7.3 x 3.0 cm cavity with air.  Imaging on April 07, 2018 is also notable mild diverticulitis.  The drain within the pelvis is not within the presumed abscess cavity.  S/P placement of pelvic drain 04/2018  with minimal output. Her appointment with Dr Marcello Moores has been rescheduled to Jan.  Drain sinogram scheduled for 06/29/2018.  The interval since the last administration of chemotherapy has been four weeks.  Serial imaging with out evidence of recurrence.  The benefit of reinstitution of cytotoxic therapy is unclear.  Cannot give bev maintenance given the microperforation.  BRCA status is unknown.  Could consider aromatase inhibition.  Follow-up in 2 months   [name], MD   Cc: Toney Reil, MD, [place]

## 2018-05-26 ENCOUNTER — Inpatient Hospital Stay: Payer: Medicare PPO

## 2018-05-26 ENCOUNTER — Inpatient Hospital Stay (HOSPITAL_BASED_OUTPATIENT_CLINIC_OR_DEPARTMENT_OTHER): Payer: Medicare PPO | Admitting: Gynecologic Oncology

## 2018-05-26 ENCOUNTER — Telehealth: Payer: Self-pay | Admitting: *Deleted

## 2018-05-26 ENCOUNTER — Ambulatory Visit (HOSPITAL_COMMUNITY): Payer: Medicare PPO

## 2018-05-26 ENCOUNTER — Encounter: Payer: Self-pay | Admitting: Gynecologic Oncology

## 2018-05-26 VITALS — BP 160/88 | HR 82 | Temp 97.8°F | Resp 20 | Ht 63.0 in | Wt 107.0 lb

## 2018-05-26 DIAGNOSIS — Z90722 Acquired absence of ovaries, bilateral: Secondary | ICD-10-CM

## 2018-05-26 DIAGNOSIS — C786 Secondary malignant neoplasm of retroperitoneum and peritoneum: Secondary | ICD-10-CM | POA: Diagnosis not present

## 2018-05-26 DIAGNOSIS — J910 Malignant pleural effusion: Secondary | ICD-10-CM

## 2018-05-26 DIAGNOSIS — C482 Malignant neoplasm of peritoneum, unspecified: Secondary | ICD-10-CM

## 2018-05-26 DIAGNOSIS — C569 Malignant neoplasm of unspecified ovary: Secondary | ICD-10-CM | POA: Diagnosis not present

## 2018-05-26 DIAGNOSIS — Z9221 Personal history of antineoplastic chemotherapy: Secondary | ICD-10-CM | POA: Diagnosis not present

## 2018-05-26 DIAGNOSIS — C561 Malignant neoplasm of right ovary: Secondary | ICD-10-CM | POA: Diagnosis not present

## 2018-05-26 DIAGNOSIS — Z90710 Acquired absence of both cervix and uterus: Secondary | ICD-10-CM

## 2018-05-26 LAB — CMP (CANCER CENTER ONLY)
ALBUMIN: 3.3 g/dL — AB (ref 3.5–5.0)
ALT: 19 U/L (ref 0–44)
AST: 23 U/L (ref 15–41)
Alkaline Phosphatase: 86 U/L (ref 38–126)
Anion gap: 9 (ref 5–15)
BUN: 17 mg/dL (ref 8–23)
CALCIUM: 9.2 mg/dL (ref 8.9–10.3)
CO2: 29 mmol/L (ref 22–32)
Chloride: 103 mmol/L (ref 98–111)
Creatinine: 0.77 mg/dL (ref 0.44–1.00)
GFR, Est AFR Am: >60 mL/min (ref >60)
GFR, Estimated: >60 mL/min (ref >60)
Glucose, Bld: 98 mg/dL (ref 70–99)
Potassium: 3.3 mmol/L — ABNORMAL LOW (ref 3.5–5.1)
Sodium: 141 mmol/L (ref 135–145)
Total Bilirubin: 0.3 mg/dL (ref 0.3–1.2)
Total Protein: 7.6 g/dL (ref 6.5–8.1)

## 2018-05-26 LAB — CBC WITH DIFFERENTIAL (CANCER CENTER ONLY)
Abs Immature Granulocytes: 0.01 10*3/uL (ref 0.00–0.07)
Basophils Absolute: 0 10*3/uL (ref 0.0–0.1)
Basophils Relative: 0 %
Eosinophils Absolute: 0.2 10*3/uL (ref 0.0–0.5)
Eosinophils Relative: 4 %
HCT: 31.4 % — ABNORMAL LOW (ref 36.0–46.0)
Hemoglobin: 9.5 g/dL — ABNORMAL LOW (ref 12.0–15.0)
Immature Granulocytes: 0 %
Lymphocytes Relative: 48 %
Lymphs Abs: 2.3 10*3/uL (ref 0.7–4.0)
MCH: 20.7 pg — AB (ref 26.0–34.0)
MCHC: 30.3 g/dL (ref 30.0–36.0)
MCV: 68.4 fL — ABNORMAL LOW (ref 80.0–100.0)
MONO ABS: 0.4 10*3/uL (ref 0.1–1.0)
Monocytes Relative: 9 %
Neutro Abs: 1.8 10*3/uL (ref 1.7–7.7)
Neutrophils Relative %: 39 %
Platelet Count: 209 10*3/uL (ref 150–400)
RBC: 4.59 MIL/uL (ref 3.87–5.11)
RDW: 16.4 % — ABNORMAL HIGH (ref 11.5–15.5)
WBC Count: 4.7 10*3/uL (ref 4.0–10.5)
nRBC: 0 % (ref 0.0–0.2)

## 2018-05-26 LAB — MAGNESIUM: Magnesium: 1.4 mg/dL — CL (ref 1.7–2.4)

## 2018-05-26 MED ORDER — HEPARIN SOD (PORK) LOCK FLUSH 100 UNIT/ML IV SOLN
250.0000 [IU] | Freq: Once | INTRAVENOUS | Status: AC
  Administered 2018-05-26: 250 [IU]
  Filled 2018-05-26: qty 5

## 2018-05-26 MED ORDER — SODIUM CHLORIDE 0.9% FLUSH
10.0000 mL | Freq: Once | INTRAVENOUS | Status: AC
  Administered 2018-05-26: 10 mL
  Filled 2018-05-26: qty 10

## 2018-05-26 NOTE — Telephone Encounter (Signed)
Telephone call to patient in regards to low magnesium today. Per Dr. Alvy Bimler, patient understands to increase oral magnesium 400mg  to twice a day. Patient is set to go out of town until Jan 3. She will continue IV mag every other day. [place] contacted to send her additional supplies to get her through until January. Patient understands to call us with any questions or concerns.

## 2018-05-26 NOTE — Patient Instructions (Signed)
   Thank you very much [name] for allowing me to provide care for you today.  I appreciate your confidence in choosing our Gynecologic Oncology team.  If you have any questions about your visit today please call our office and we will get back to you as soon as possible.  Please consider using the website Medlineplus.gov as an Geneticist, molecular. Follow-up 07/21/2018 Francetta Found. Rielyn Krupinski MD., PhD Gynecologic Oncology

## 2018-06-10 ENCOUNTER — Ambulatory Visit: Payer: Self-pay | Admitting: General Surgery

## 2018-06-10 ENCOUNTER — Inpatient Hospital Stay: Payer: Medicare PPO | Attending: Hematology and Oncology

## 2018-06-10 DIAGNOSIS — E860 Dehydration: Secondary | ICD-10-CM | POA: Insufficient documentation

## 2018-06-10 DIAGNOSIS — C482 Malignant neoplasm of peritoneum, unspecified: Secondary | ICD-10-CM

## 2018-06-10 DIAGNOSIS — C561 Malignant neoplasm of right ovary: Secondary | ICD-10-CM | POA: Insufficient documentation

## 2018-06-10 DIAGNOSIS — Z452 Encounter for adjustment and management of vascular access device: Secondary | ICD-10-CM | POA: Diagnosis not present

## 2018-06-10 DIAGNOSIS — R509 Fever, unspecified: Secondary | ICD-10-CM | POA: Insufficient documentation

## 2018-06-10 DIAGNOSIS — C562 Malignant neoplasm of left ovary: Secondary | ICD-10-CM | POA: Insufficient documentation

## 2018-06-10 LAB — CBC WITH DIFFERENTIAL (CANCER CENTER ONLY)
Abs Immature Granulocytes: 0.01 10*3/uL (ref 0.00–0.07)
Basophils Absolute: 0 10*3/uL (ref 0.0–0.1)
Basophils Relative: 0 %
Eosinophils Absolute: 0.2 10*3/uL (ref 0.0–0.5)
Eosinophils Relative: 4 %
HCT: 35.9 % — ABNORMAL LOW (ref 36.0–46.0)
Hemoglobin: 10.8 g/dL — ABNORMAL LOW (ref 12.0–15.0)
Immature Granulocytes: 0 %
Lymphocytes Relative: 49 %
Lymphs Abs: 2.8 10*3/uL (ref 0.7–4.0)
MCH: 20.3 pg — ABNORMAL LOW (ref 26.0–34.0)
MCHC: 30.1 g/dL (ref 30.0–36.0)
MCV: 67.5 fL — ABNORMAL LOW (ref 80.0–100.0)
Monocytes Absolute: 0.5 10*3/uL (ref 0.1–1.0)
Monocytes Relative: 8 %
NEUTROS PCT: 39 %
Neutro Abs: 2.3 10*3/uL (ref 1.7–7.7)
PLATELETS: 246 10*3/uL (ref 150–400)
RBC: 5.32 MIL/uL — ABNORMAL HIGH (ref 3.87–5.11)
RDW: 16.5 % — ABNORMAL HIGH (ref 11.5–15.5)
WBC Count: 5.8 10*3/uL (ref 4.0–10.5)
nRBC: 0 % (ref 0.0–0.2)

## 2018-06-10 LAB — CMP (CANCER CENTER ONLY)
ALT: 12 U/L (ref 0–44)
AST: 18 U/L (ref 15–41)
Albumin: 3.5 g/dL (ref 3.5–5.0)
Alkaline Phosphatase: 94 U/L (ref 38–126)
Anion gap: 9 (ref 5–15)
BUN: 14 mg/dL (ref 8–23)
CHLORIDE: 105 mmol/L (ref 98–111)
CO2: 27 mmol/L (ref 22–32)
Calcium: 9.8 mg/dL (ref 8.9–10.3)
Creatinine: 0.88 mg/dL (ref 0.44–1.00)
GFR, Est AFR Am: >60 mL/min (ref >60)
Glucose, Bld: 88 mg/dL (ref 70–99)
Potassium: 4.1 mmol/L (ref 3.5–5.1)
Sodium: 141 mmol/L (ref 135–145)
Total Bilirubin: 0.3 mg/dL (ref 0.3–1.2)
Total Protein: 8.6 g/dL — ABNORMAL HIGH (ref 6.5–8.1)

## 2018-06-10 LAB — MAGNESIUM: Magnesium: 1.6 mg/dL — ABNORMAL LOW (ref 1.7–2.4)

## 2018-06-10 NOTE — H&P (Signed)
History of Present Illness Leighton Ruff MD; 10/14/5636 4:15 PM) The patient is a 73 year old female who presents for an intra-abdominal abscess. 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 According to her she has no signs of residual malignancy. She is not on active chemotherapy. She is tolerating a diet and gaining some of her weight back. She reports regular bowel habits and denies any crampy abdominal pain or diarrhea. She denies any rectal bleeding. Her last colonoscopy was approximately 5 years ago and normal.   Past Surgical History Alean Rinne, [place]; 06/10/2018 3:53 PM) Foot Surgery Left. Oral Surgery Spinal Surgery - Neck  Diagnostic Studies History Alean Rinne, [place]; 06/10/2018 3:53 PM) Colonoscopy 1-5 years ago Mammogram within last year Pap Smear never  Allergies Alean Rinne, RMA; 06/10/2018 3:57 PM) No Known Drug Allergies [06/10/2018]: (Marked as Inactive) Allergies Reconciled oxyCODONE HCl *ANALGESICS - OPIOID*  Medication History Alean Rinne, RMA; 06/10/2018 3:58 PM) Magnesium Sulfate (40GM/1000ML Solution, Intravenous) Active. metroNIDAZOLE (500MG  Tablet, Oral) Active. Mirtazapine (30MG  Tablet, Oral) Active. Sodium Chloride (0.9% Solution, Intravenous) Active. Medications Reconciled  Social History Alean Rinne, [place]; 06/10/2018 3:53 PM) Alcohol use Occasional alcohol use. Caffeine use Carbonated beverages, Coffee. No drug use Tobacco use Former smoker.  Family History Alean Rinne, [place]; 06/10/2018 3:53 PM) Arthritis Father, Mother. Cancer Sister. Heart Disease Brother, Father, Mother. Heart disease in female family member before age 77 Hypertension Brother, Family Members In [place], Father, Sister. Respiratory Condition Father, Sister.  Pregnancy / Birth History Alean Rinne, [place]; 06/10/2018 3:53 PM) Age at  menarche 59 years. Age of menopause 51-55 Contraceptive History Oral contraceptives. Gravida 3 Irregular periods Maternal age 69-20 Para 3  Other Problems Alean Rinne, [place]; 06/10/2018 3:53 PM) Anxiety Disorder Arthritis Asthma Back Pain Chronic Obstructive Lung Disease Diverticulosis Gastroesophageal Reflux Disease Heart murmur High blood pressure Hypercholesterolemia Oophorectomy Left. Ovarian Cancer Sleep Apnea     Review of Systems Alean Rinne RMA; 06/10/2018 3:53 PM) General Present- Appetite Loss, Fatigue and Weight Loss. Not Present- Chills, Fever, Night Sweats and Weight Gain. Skin Present- Dryness. Not Present- Change in Wart/Mole, Hives, Jaundice, New Lesions, Non-Healing Wounds, Rash and Ulcer. HEENT Present- Seasonal Allergies and Wears glasses/contact lenses. Not Present- Earache, Hearing Loss, Hoarseness, Nose Bleed, Oral Ulcers, Ringing in the Ears, Sinus Pain, Sore Throat, Visual Disturbances and Yellow Eyes. Respiratory Present- Chronic Cough, Difficulty Breathing and Snoring. Not Present- Bloody sputum and Wheezing. Breast Not Present- Breast Mass, Breast Pain, Nipple Discharge and Skin Changes. Cardiovascular Present- Palpitations, Rapid Heart Rate and Shortness of Breath. Not Present- Chest Pain, Difficulty Breathing Lying Down, Leg Cramps and Swelling of Extremities. Gastrointestinal Present- Gets full quickly at meals. Not Present- Abdominal Pain, Bloating, Bloody Stool, Change in Bowel Habits, Chronic diarrhea, Constipation, Difficulty Swallowing, Excessive gas, Hemorrhoids, Indigestion, Nausea, Rectal Pain and Vomiting. Female Genitourinary Present- Frequency and Nocturia. Not Present- Painful Urination, Pelvic Pain and Urgency. Musculoskeletal Present- Back Pain, Joint Stiffness and Muscle Weakness. Not Present- Joint Pain, Muscle Pain and Swelling of Extremities. Neurological Present- Decreased Memory, Numbness and Weakness. Not  Present- Fainting, Headaches, Seizures, Tingling, Tremor and Trouble walking. Psychiatric Present- Anxiety. Not Present- Bipolar, Change in Sleep Pattern, Depression, Fearful and Frequent crying. Endocrine Not Present- Cold Intolerance, Excessive Hunger, Hair Changes, Heat Intolerance, Hot flashes and New Diabetes. Hematology Not Present- Blood Thinners, Easy Bruising, Excessive bleeding, Gland problems, HIV and Persistent Infections.  Vitals Alean Rinne RMA; 06/10/2018 3:54 PM) 06/10/2018  3:54 PM Weight: 105.8 lb Height: 63in Body Surface Area: 1.48 m Body Mass Index: 18.74 kg/m  Temp.: 99.57F  Pulse: 96 (Regular)  BP: 150/87 (Sitting, Left Arm, Standard)      Physical Exam Leighton Ruff MD; 10/08/9765 4:16 PM)  General Mental Status-Alert. General Appearance-Not in acute distress. Build & Nutrition-Well nourished. Posture-Normal posture. Gait-Normal.  Head and Neck Head-normocephalic, atraumatic with no lesions or palpable masses. Trachea-midline.  Chest and Lung Exam Chest and lung exam reveals -on auscultation, normal breath sounds, no adventitious sounds and normal vocal resonance.  Cardiovascular Cardiovascular examination reveals -normal heart sounds, regular rate and rhythm with no murmurs and no digital clubbing, cyanosis, edema, increased warmth or tenderness.  Abdomen Inspection Inspection of the abdomen reveals - No Hernias. Note: Drain noted in patient's left lower quadrant with purulent drainage in bulb. Palpation/Percussion Palpation and Percussion of the abdomen reveal - Soft, Non Tender, No Rigidity (guarding), No hepatosplenomegaly and No Palpable abdominal masses.  Neurologic Neurologic evaluation reveals -alert and oriented x 3 with no impairment of recent or remote memory, normal attention span and ability to concentrate, normal sensation and normal coordination.  Musculoskeletal Normal Exam - Bilateral-Upper  Extremity Strength Normal and Lower Extremity Strength Normal.    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 This resulted in a pelvic abscess which was treated with a CT-guided drain. She has had the drain in for several months with a persistent communication to the sigmoid colon. The fluid collection is along the left pelvic sidewall. On exam today she has no major abdominal tenderness but does have purulent drainage from her JP drain. She is currently not on chemotherapy and is tolerating a diet without difficulty. She is having regular bowel function. I do not see any obvious reason why she would have a persistent fistula. I have recommended a flexible sigmoidoscopy to visualize the colon mucosa and evaluate for colonic stricture. If no stricture is identified, I think it would be reasonable to slowly withdraw the drain over the period of several weeks and allow the fistula to heal behind it. If there is an obvious stricture or diverticulitis, she will most likely need a sigmoidectomy.

## 2018-06-11 LAB — CA 125: Cancer Antigen (CA) 125: 19.4 U/mL (ref 0.0–38.1)

## 2018-06-13 ENCOUNTER — Encounter: Payer: Self-pay | Admitting: Surgery

## 2018-06-13 ENCOUNTER — Other Ambulatory Visit: Payer: Self-pay | Admitting: Gynecologic Oncology

## 2018-06-13 ENCOUNTER — Ambulatory Visit (INDEPENDENT_AMBULATORY_CARE_PROVIDER_SITE_OTHER): Payer: Medicare PPO | Admitting: Surgery

## 2018-06-13 ENCOUNTER — Ambulatory Visit (HOSPITAL_COMMUNITY): Admission: RE | Admit: 2018-06-13 | Payer: Medicare PPO | Source: Ambulatory Visit

## 2018-06-13 ENCOUNTER — Other Ambulatory Visit: Payer: Self-pay

## 2018-06-13 ENCOUNTER — Ambulatory Visit (HOSPITAL_COMMUNITY)
Admission: RE | Admit: 2018-06-13 | Discharge: 2018-06-13 | Disposition: A | Discharge status: Home / Self Care | Payer: Medicare PPO | Source: Ambulatory Visit | Attending: Interventional Radiology | Admitting: Interventional Radiology

## 2018-06-13 ENCOUNTER — Encounter

## 2018-06-13 ENCOUNTER — Telehealth: Payer: Self-pay | Admitting: *Deleted

## 2018-06-13 VITALS — BP 137/87 | HR 86 | Temp 98.2°F | Resp 18 | Ht 63.0 in | Wt 107.0 lb

## 2018-06-13 DIAGNOSIS — N739 Female pelvic inflammatory disease, unspecified: Secondary | ICD-10-CM

## 2018-06-13 DIAGNOSIS — I714 Abdominal aortic aneurysm, without rupture, unspecified: Secondary | ICD-10-CM

## 2018-06-13 DIAGNOSIS — K5780 Diverticulitis of intestine, part unspecified, with perforation and abscess without bleeding: Secondary | ICD-10-CM | POA: Diagnosis not present

## 2018-06-13 DIAGNOSIS — Z4803 Encounter for change or removal of drains: Secondary | ICD-10-CM | POA: Diagnosis not present

## 2018-06-13 HISTORY — PX: IR CATHETER TUBE CHANGE: IMG717

## 2018-06-13 MED ORDER — LIDOCAINE HCL 1 % IJ SOLN
INTRAMUSCULAR | Status: AC | PRN
  Administered 2018-06-13: 5 mL via INTRADERMAL

## 2018-06-13 MED ORDER — IOPAMIDOL (ISOVUE-300) INJECTION 61%
50.0000 mL | Freq: Once | INTRAVENOUS | Status: AC | PRN
  Administered 2018-06-13: 5 mL

## 2018-06-13 MED ORDER — IOPAMIDOL (ISOVUE-300) INJECTION 61%
INTRAVENOUS | Status: AC
  Administered 2018-06-13: 5 mL
  Filled 2018-06-13: qty 50

## 2018-06-13 MED ORDER — LIDOCAINE HCL 1 % IJ SOLN
INTRAMUSCULAR | Status: AC
  Filled 2018-06-13: qty 20

## 2018-06-13 NOTE — Progress Notes (Signed)
[facility] [place]  Patient name: Jo Parker MRN: [id number] DOB: 05-09-1946 Sex: female   REQUESTING PROVIDER:    [name]   REASON FOR CONSULT:    Aortic dissection with aneurysmal change  HISTORY OF PRESENT ILLNESS:   Jo Parker is a 73 y.o. female, who is referred for evaluation of a abdominal aortic aneurysm.  She initially presented in March 2019 with a abdominal ultrasound to evaluate her history of aortic aneurysm.  She had an incidental finding of a cirrhotic appearing liver with ascites.  This was initially treated with diuretics but she did not improve.  Further work-up included a CT of the abdomen and pelvis which showed significant carcinomatosis with peritoneal thickening paracentesis was performed.  She also had a pleural effusion that was positive for adenocarcinoma.  She had surgery on 01/27/2018 for stage IV ovarian cancer following neoadjuvant chemotherapy.  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  She came back to the hospital for interval debulking surgery which was complicated by sepsis and respiratory failure.  She has recovered remarkably well.  She still has a drain in place.    The patient has a history of COPD with history of smoking.  PAST MEDICAL HISTORY    Past Medical History:  Diagnosis Date  . Acute on chronic respiratory failure with hypoxia ([place]) 03/07/2018  . Anxiety and depression   . Aortic aneurysm ([place])    see ov note 03/2017   . Arthritis   . Carcinomatosis ([place]) 10/2017  . COPD (chronic obstructive pulmonary disease) ([place]) 10/23/2017  . Depression   . Family history of breast cancer   . Family history of prostate cancer   . Family history of uterine cancer   . History of diverticulitis   . HLD (hyperlipidemia) 10/23/2017  . Hypercholesterolemia   . Hypertension   . Mitral valve prolapse   . Sleep apnea    cpap machine mask and tubing in [place] per patient.  Patient does not have mask or tubing to bring on DOS of 01/27/2018.   Marland Kitchen Uterine cancer ([place])    had partial hysterectomy at 87     FAMILY HISTORY   Family History  Problem Relation Age of Onset  . Endometrial cancer Sister 56       endometrial ca  . Endometrial cancer Paternal Aunt 80  . Breast cancer Paternal Aunt   . Stomach cancer Maternal Grandfather        d. 9s  . Prostate cancer Paternal Uncle   . Breast cancer Cousin        pat first cousin  . Breast cancer Cousin        pat first cousin's daughter  . Lung cancer Father        lung ca  . Heart disease Mother   . Stroke Paternal Grandmother   . Endometrial cancer Cousin        paternal first cousin's daughter  . Breast cancer Other        niece dx less than 51s  . Breast cancer Other        niece, dx under 58    SOCIAL HISTORY:   Social History   Socioeconomic History  . Marital status: Married    Spouse name: Not on file  . Number of children: 3  . Years of education: Not on file  . Highest education level: Not on file  Occupational History  . Occupation: retired  Scientific laboratory technician  .  Financial resource strain: Not on file  . Food insecurity:    Worry: Not on file    Inability: Not on file  . Transportation needs:    Medical: Not on file    Non-medical: Not on file  Tobacco Use  . Smoking status: Former Smoker    Packs/day: 1.00    Years: 55.00    Pack years: 55.00    Last attempt to quit: 10/06/2017    Years since quitting: 0.6  . Smokeless tobacco: Never Used  Substance and Sexual Activity  . Alcohol use: Never    Frequency: Never  . Drug use: Never  . Sexual activity: Not Currently  Lifestyle  . Physical activity:    Days per week: Not on file    Minutes per session: Not on file  . Stress: Not on file  Relationships  . Social connections:    Talks on phone: Not on file    Gets together: Not on file    Attends religious service: Not on file    Active member of club or  organization: Not on file    Attends meetings of clubs or organizations: Not on file    Relationship status: Not on file  . Intimate partner violence:    Fear of current or ex partner: Not on file    Emotionally abused: Not on file    Physically abused: Not on file    Forced sexual activity: Not on file  Other Topics Concern  . Not on file  Social History Narrative  . Not on file    ALLERGIES:    Allergies  Allergen Reactions  . Oxycodone     confusion    CURRENT MEDICATIONS:    Current Outpatient Medications  Medication Sig Dispense Refill  . acetaminophen (TYLENOL) 325 MG tablet Take 2 tablets (650 mg total) by mouth every 6 (six) hours as needed for mild pain, fever or headache.    . lidocaine-prilocaine (EMLA) cream Apply 1 application topically as needed. 30 g 0  . magnesium 30 MG tablet Take 30 mg by mouth 2 (two) times daily.    . metoprolol succinate (TOPROL-XL) 25 MG 24 hr tablet daily.   0  . mirtazapine (REMERON) 30 MG tablet Take 1 tablet (30 mg total) by mouth at bedtime. 90 tablet 1   No current facility-administered medications for this visit.     REVIEW OF SYSTEMS:   [X]  denotes positive finding, [ ]  denotes negative finding Cardiac  Comments:  Chest pain or chest pressure:    Shortness of breath upon exertion: xx   Short of breath when lying flat:    Irregular heart rhythm:        Vascular    Pain in calf, thigh, or hip brought on by ambulation:    Pain in feet at night that wakes you up from your sleep:     Blood clot in your veins:    Leg swelling:         Pulmonary    Oxygen at home:    Productive cough:     Wheezing:         Neurologic    Sudden weakness in arms or legs:     Sudden numbness in arms or legs:     Sudden onset of difficulty speaking or slurred speech:    Temporary loss of vision in one eye:     Problems with dizziness:         Gastrointestinal  Blood in stool:      Vomited blood:         Genitourinary      Burning when urinating:     Blood in urine:        Psychiatric    Major depression:         Hematologic    Bleeding problems:    Problems with blood clotting too easily:        Skin    Rashes or ulcers:        Constitutional    Fever or chills:     PHYSICAL EXAM:   Vitals:   06/13/18 0921  BP: 137/87  Pulse: 86  Resp: 18  Temp: 98.2 F (36.8 C)  SpO2: 97%  Weight: 107 lb (48.5 kg)  Height: 5\' 3"  (1.6 m)    GENERAL: The patient is a well-nourished female, in no acute distress. The vital signs are documented above. CARDIAC: There is a regular rate and rhythm.  VASCULAR: Palpable pedal pulses bilaterally PULMONARY: Nonlabored respirations ABDOMEN: Soft and non-tender  MUSCULOSKELETAL: There are no major deformities or cyanosis. NEUROLOGIC: No focal weakness or paresthesias are detected. SKIN: There are no ulcers or rashes noted. PSYCHIATRIC: The patient has a normal affect.  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  ASSESSMENT and PLAN   AAA: I discussed with the patient and her son that indications for repair would include rapid change or size greater than 5 cm.  The aneurysm does get very close to the celiac artery and so she will likely need visceral reconstruction.  Depending on device availability, she may need evaluation at [facility].  She will follow up with me in 1 year with a CTA of the chest, abdomen and pelvis   Annamarie Major, MD [facility] [phone] Pager [phone]

## 2018-06-13 NOTE — Telephone Encounter (Signed)
RE: magnesium   Discussed results with the patient. She understands directions. Spoke to [place] at [facility] and she will send out more supplies to continue same orders.

## 2018-06-14 ENCOUNTER — Telehealth: Payer: Self-pay

## 2018-06-14 ENCOUNTER — Telehealth: Payer: Self-pay | Admitting: Hematology and Oncology

## 2018-06-14 ENCOUNTER — Other Ambulatory Visit: Payer: Self-pay | Admitting: Gynecologic Oncology

## 2018-06-14 DIAGNOSIS — N739 Female pelvic inflammatory disease, unspecified: Secondary | ICD-10-CM

## 2018-06-14 NOTE — Telephone Encounter (Signed)
Called regarding below message. She is planning on going to [place] on 1/10. She can come in on 1/9 for labs. Scheduling message sent for labs and flush appt for 1/9.

## 2018-06-14 NOTE — Telephone Encounter (Signed)
-----   Message from [name], MD sent at 06/14/2018  9:40 AM EST ----- Regarding: labs next week Can you call her to set up repeat labs next week? Around 1/10

## 2018-06-14 NOTE — Telephone Encounter (Signed)
Scheduled appt per 1/7 sch message - per patient request in the afternoon 1/8 .

## 2018-06-15 ENCOUNTER — Inpatient Hospital Stay: Payer: Medicare PPO

## 2018-06-15 ENCOUNTER — Telehealth: Payer: Self-pay | Admitting: Oncology

## 2018-06-15 DIAGNOSIS — C561 Malignant neoplasm of right ovary: Secondary | ICD-10-CM | POA: Diagnosis not present

## 2018-06-15 DIAGNOSIS — C482 Malignant neoplasm of peritoneum, unspecified: Secondary | ICD-10-CM

## 2018-06-15 LAB — CMP (CANCER CENTER ONLY)
ALT: 12 U/L (ref 0–44)
AST: 17 U/L (ref 15–41)
Albumin: 3.6 g/dL (ref 3.5–5.0)
Alkaline Phosphatase: 90 U/L (ref 38–126)
Anion gap: 10 (ref 5–15)
BUN: 15 mg/dL (ref 8–23)
CO2: 27 mmol/L (ref 22–32)
Calcium: 9.6 mg/dL (ref 8.9–10.3)
Chloride: 105 mmol/L (ref 98–111)
Creatinine: 0.82 mg/dL (ref 0.44–1.00)
GFR, Est AFR Am: >60 mL/min (ref >60)
GFR, Estimated: >60 mL/min (ref >60)
GLUCOSE: 94 mg/dL (ref 70–99)
Potassium: 4 mmol/L (ref 3.5–5.1)
Sodium: 142 mmol/L (ref 135–145)
TOTAL PROTEIN: 8.5 g/dL — AB (ref 6.5–8.1)
Total Bilirubin: 0.4 mg/dL (ref 0.3–1.2)

## 2018-06-15 LAB — CBC WITH DIFFERENTIAL (CANCER CENTER ONLY)
Abs Immature Granulocytes: 0.02 10*3/uL (ref 0.00–0.07)
Basophils Absolute: 0 10*3/uL (ref 0.0–0.1)
Basophils Relative: 0 %
EOS PCT: 4 %
Eosinophils Absolute: 0.2 10*3/uL (ref 0.0–0.5)
HCT: 34.4 % — ABNORMAL LOW (ref 36.0–46.0)
Hemoglobin: 10.7 g/dL — ABNORMAL LOW (ref 12.0–15.0)
Immature Granulocytes: 0 %
Lymphocytes Relative: 44 %
Lymphs Abs: 2.5 10*3/uL (ref 0.7–4.0)
MCH: 20.7 pg — ABNORMAL LOW (ref 26.0–34.0)
MCHC: 31.1 g/dL (ref 30.0–36.0)
MCV: 66.4 fL — ABNORMAL LOW (ref 80.0–100.0)
Monocytes Absolute: 0.6 10*3/uL (ref 0.1–1.0)
Monocytes Relative: 11 %
Neutro Abs: 2.4 10*3/uL (ref 1.7–7.7)
Neutrophils Relative %: 41 %
Platelet Count: 215 10*3/uL (ref 150–400)
RBC: 5.18 MIL/uL — ABNORMAL HIGH (ref 3.87–5.11)
RDW: 16.7 % — ABNORMAL HIGH (ref 11.5–15.5)
WBC Count: 5.8 10*3/uL (ref 4.0–10.5)
nRBC: 0 % (ref 0.0–0.2)

## 2018-06-15 LAB — MAGNESIUM: Magnesium: 1.7 mg/dL (ref 1.7–2.4)

## 2018-06-15 MED ORDER — HEPARIN SOD (PORK) LOCK FLUSH 100 UNIT/ML IV SOLN
500.0000 [IU] | Freq: Once | INTRAVENOUS | Status: AC
  Administered 2018-06-15: 500 [IU]
  Filled 2018-06-15: qty 5

## 2018-06-15 MED ORDER — SODIUM CHLORIDE 0.9% FLUSH
10.0000 mL | Freq: Once | INTRAVENOUS | Status: AC
  Administered 2018-06-15: 10 mL
  Filled 2018-06-15: qty 10

## 2018-06-15 MED ORDER — HEPARIN SOD (PORK) LOCK FLUSH 100 UNIT/ML IV SOLN
250.0000 [IU] | Freq: Once | INTRAVENOUS | Status: DC
  Filled 2018-06-15: qty 5

## 2018-06-15 NOTE — Telephone Encounter (Signed)
Jo Parker called and wanted to discuss who should be managing Jo Parker's drain.  They have seen 3 doctors this week and are confused on who should be managing it.  Advised him that Jo Parker is managing the drain.  IR manages how the drain is working/tube exchanges.  Also discussed that Jo Parker is a vascular specialist and is monitoring Jo Parker's abdominal aortic aneurysm.   He verbalized agreement and understanding.

## 2018-06-16 ENCOUNTER — Telehealth: Payer: Self-pay

## 2018-06-16 ENCOUNTER — Telehealth: Payer: Self-pay | Admitting: Hematology and Oncology

## 2018-06-16 NOTE — Telephone Encounter (Signed)
Called Jo Parker and given below message. He verbalized understanding. Scheduling message sent. Nurse at the cancer center de-accessed port yesterday.  Called [facility] and notified IV Magnesium no longer needed. Patient may have plain IV fluids as needed. Talked with pharmacy at [facility].  Called nurse at [facility] to make visit and re- access port. Given orders to re-access port and care of port site. They will make visit today.

## 2018-06-16 NOTE — Telephone Encounter (Signed)
-----   Message from [name], MD sent at 06/16/2018  8:19 AM EST ----- Regarding: good news Her labs looks good OK to stop IV mag but I recommend continue oral mag BID until her drains are out Can you schedule labs & flush again the same day she comes for IR? Thanks

## 2018-06-16 NOTE — Telephone Encounter (Signed)
Scheduled appt per 1/9 sch message - pt is aware of appt date and time   

## 2018-06-17 ENCOUNTER — Telehealth: Payer: Self-pay | Admitting: Oncology

## 2018-06-17 NOTE — Telephone Encounter (Signed)
Jo Parker left a message saying that [facility] is supposed to access her porta cath for home IV fluids but they do not have orders.  Message given to Clarise Cruz, RN with Dr. Alvy Bimler.

## 2018-06-22 ENCOUNTER — Telehealth: Payer: Self-pay | Admitting: Oncology

## 2018-06-22 NOTE — Telephone Encounter (Signed)
Lenix called and said she is feeling sluggish, tired and has no energy.  She denies having a fever or increase in shortness of breath.  She is currently at home in [place] because she had a 10 day break.  Asked if she has a doctor or ER she can go to there if needed.  She said she does.  Also offered her an appointment with symptom management if needed tomorrow.  She said she would think about it and call back.

## 2018-06-23 ENCOUNTER — Telehealth: Payer: Self-pay | Admitting: Oncology

## 2018-06-23 NOTE — Telephone Encounter (Signed)
Called [facility] and she said she is feeling better today.  She thinks the sluggish feeling is because her appetite is not good.  She is trying to eat but has to force herself.  She also asked about having her port needle changed in [place].  She said it was changed last Friday and she has been talking to her nurse, Roselyn Reef at [facility] to have it changed while she is in [place].  Advised her that we will check in to it.  Called Pam with [facility] and she is going to check with the office to see what the plan is.  She will call back.

## 2018-06-24 ENCOUNTER — Telehealth: Payer: Self-pay | Admitting: Oncology

## 2018-06-24 NOTE — Telephone Encounter (Signed)
Received call from [facility] at [facility] that patient has permanently moved back to [facility] and will need new orders to transfer her care to another home health agency/pharmacy since [facility] is not in the area.    Called patient's son, [name], and he said Ammanda has not moved to [facility] [facility].  She is just there until her next appointment on Thursday.  Advised him that she needs to have her port needle changed today and may need to go to the ER in [place] to have it changed since [facility] can't help there.  He said he was going to call the oncologist that originally diagnosed her cancer to see if they can help with the port.    Called Pam back and advised her that per Patrina Levering has not moved to [facility].

## 2018-06-24 NOTE — Telephone Encounter (Signed)
Called Dr. Archie Patten back and advised her that no antibiotics are needed and to deaccess port per Dr. Alvy Bimler.    Also called [name] and advised him that we are going to stop Krisalyn's fluids since her last labs were good.  Asked if he would like them rechecked before her procedure on 07/01/18 and he said yes.  Scheduling message sent.

## 2018-06-24 NOTE — Telephone Encounter (Signed)
Received a call from Dr. Charissa Bash, [facility] oncologist in [place].  She said Jo Parker showed up today for a port flush and the cap was off her port and blood was backed up in the tubing. She is going to give her a liter of fluids today and also wants to know if she should give antibiotics.  In addition, she wants to make sure it is ok to deaccess her.  She said they can give her fluids next week before she comes back to [place].

## 2018-06-30 ENCOUNTER — Other Ambulatory Visit: Payer: Self-pay

## 2018-06-30 ENCOUNTER — Emergency Department (HOSPITAL_COMMUNITY): Payer: Medicare PPO

## 2018-06-30 ENCOUNTER — Encounter (HOSPITAL_COMMUNITY): Payer: Self-pay

## 2018-06-30 ENCOUNTER — Encounter: Payer: Self-pay | Admitting: Oncology

## 2018-06-30 ENCOUNTER — Inpatient Hospital Stay (HOSPITAL_COMMUNITY)
Admission: EM | Admit: 2018-06-30 | Discharge: 2018-07-07 | DRG: 314 | Disposition: A | Discharge status: Home Health | Payer: Medicare PPO | Attending: Internal Medicine | Admitting: Internal Medicine

## 2018-06-30 ENCOUNTER — Telehealth: Payer: Self-pay | Admitting: Oncology

## 2018-06-30 ENCOUNTER — Inpatient Hospital Stay: Payer: Medicare PPO

## 2018-06-30 ENCOUNTER — Ambulatory Visit (HOSPITAL_COMMUNITY): Admission: RE | Admit: 2018-06-30 | Payer: Medicare PPO | Source: Home / Self Care | Admitting: General Surgery

## 2018-06-30 ENCOUNTER — Other Ambulatory Visit: Payer: Self-pay | Admitting: Emergency Medicine

## 2018-06-30 ENCOUNTER — Encounter (HOSPITAL_COMMUNITY): Admission: RE | Payer: Self-pay | Source: Home / Self Care

## 2018-06-30 ENCOUNTER — Inpatient Hospital Stay (HOSPITAL_BASED_OUTPATIENT_CLINIC_OR_DEPARTMENT_OTHER): Payer: Medicare PPO | Admitting: Medical

## 2018-06-30 DIAGNOSIS — Z823 Family history of stroke: Secondary | ICD-10-CM

## 2018-06-30 DIAGNOSIS — I509 Heart failure, unspecified: Secondary | ICD-10-CM | POA: Diagnosis present

## 2018-06-30 DIAGNOSIS — A4101 Sepsis due to Methicillin susceptible Staphylococcus aureus: Secondary | ICD-10-CM

## 2018-06-30 DIAGNOSIS — E785 Hyperlipidemia, unspecified: Secondary | ICD-10-CM | POA: Diagnosis present

## 2018-06-30 DIAGNOSIS — C561 Malignant neoplasm of right ovary: Secondary | ICD-10-CM | POA: Diagnosis not present

## 2018-06-30 DIAGNOSIS — E860 Dehydration: Secondary | ICD-10-CM | POA: Diagnosis not present

## 2018-06-30 DIAGNOSIS — R531 Weakness: Secondary | ICD-10-CM | POA: Diagnosis not present

## 2018-06-30 DIAGNOSIS — D6481 Anemia due to antineoplastic chemotherapy: Secondary | ICD-10-CM | POA: Diagnosis present

## 2018-06-30 DIAGNOSIS — Z8249 Family history of ischemic heart disease and other diseases of the circulatory system: Secondary | ICD-10-CM

## 2018-06-30 DIAGNOSIS — J449 Chronic obstructive pulmonary disease, unspecified: Secondary | ICD-10-CM | POA: Diagnosis present

## 2018-06-30 DIAGNOSIS — C482 Malignant neoplasm of peritoneum, unspecified: Secondary | ICD-10-CM | POA: Diagnosis present

## 2018-06-30 DIAGNOSIS — Z90711 Acquired absence of uterus with remaining cervical stump: Secondary | ICD-10-CM

## 2018-06-30 DIAGNOSIS — D649 Anemia, unspecified: Secondary | ICD-10-CM | POA: Diagnosis not present

## 2018-06-30 DIAGNOSIS — T80211A Bloodstream infection due to central venous catheter, initial encounter: Secondary | ICD-10-CM | POA: Diagnosis not present

## 2018-06-30 DIAGNOSIS — Z87891 Personal history of nicotine dependence: Secondary | ICD-10-CM

## 2018-06-30 DIAGNOSIS — E876 Hypokalemia: Secondary | ICD-10-CM | POA: Diagnosis not present

## 2018-06-30 DIAGNOSIS — K5909 Other constipation: Secondary | ICD-10-CM

## 2018-06-30 DIAGNOSIS — Z8542 Personal history of malignant neoplasm of other parts of uterus: Secondary | ICD-10-CM

## 2018-06-30 DIAGNOSIS — R509 Fever, unspecified: Secondary | ICD-10-CM | POA: Diagnosis not present

## 2018-06-30 DIAGNOSIS — T451X5A Adverse effect of antineoplastic and immunosuppressive drugs, initial encounter: Secondary | ICD-10-CM | POA: Diagnosis present

## 2018-06-30 DIAGNOSIS — E7800 Pure hypercholesterolemia, unspecified: Secondary | ICD-10-CM | POA: Diagnosis present

## 2018-06-30 DIAGNOSIS — D569 Thalassemia, unspecified: Secondary | ICD-10-CM | POA: Diagnosis present

## 2018-06-30 DIAGNOSIS — J90 Pleural effusion, not elsewhere classified: Secondary | ICD-10-CM

## 2018-06-30 DIAGNOSIS — Z8543 Personal history of malignant neoplasm of ovary: Secondary | ICD-10-CM

## 2018-06-30 DIAGNOSIS — I110 Hypertensive heart disease with heart failure: Secondary | ICD-10-CM | POA: Diagnosis present

## 2018-06-30 DIAGNOSIS — K632 Fistula of intestine: Secondary | ICD-10-CM | POA: Diagnosis present

## 2018-06-30 DIAGNOSIS — E43 Unspecified severe protein-calorie malnutrition: Secondary | ICD-10-CM | POA: Diagnosis present

## 2018-06-30 DIAGNOSIS — Z79899 Other long term (current) drug therapy: Secondary | ICD-10-CM

## 2018-06-30 DIAGNOSIS — B9561 Methicillin susceptible Staphylococcus aureus infection as the cause of diseases classified elsewhere: Secondary | ICD-10-CM | POA: Diagnosis present

## 2018-06-30 DIAGNOSIS — Z681 Body mass index (BMI) 19 or less, adult: Secondary | ICD-10-CM

## 2018-06-30 DIAGNOSIS — C786 Secondary malignant neoplasm of retroperitoneum and peritoneum: Secondary | ICD-10-CM | POA: Diagnosis present

## 2018-06-30 DIAGNOSIS — R6510 Systemic inflammatory response syndrome (SIRS) of non-infectious origin without acute organ dysfunction: Secondary | ICD-10-CM | POA: Diagnosis present

## 2018-06-30 DIAGNOSIS — C562 Malignant neoplasm of left ovary: Secondary | ICD-10-CM

## 2018-06-30 DIAGNOSIS — A419 Sepsis, unspecified organism: Secondary | ICD-10-CM

## 2018-06-30 DIAGNOSIS — R7881 Bacteremia: Secondary | ICD-10-CM

## 2018-06-30 DIAGNOSIS — Y848 Other medical procedures as the cause of abnormal reaction of the patient, or of later complication, without mention of misadventure at the time of the procedure: Secondary | ICD-10-CM | POA: Diagnosis present

## 2018-06-30 HISTORY — DX: Pneumonitis due to inhalation of food and vomit: J69.0

## 2018-06-30 HISTORY — DX: Heart failure, unspecified: I50.9

## 2018-06-30 HISTORY — DX: Female pelvic inflammatory disease, unspecified: N73.9

## 2018-06-30 LAB — MAGNESIUM
Magnesium: 1.6 mg/dL — ABNORMAL LOW (ref 1.7–2.4)
Magnesium: 2.5 mg/dL — ABNORMAL HIGH (ref 1.7–2.4)

## 2018-06-30 LAB — CBC WITH DIFFERENTIAL (CANCER CENTER ONLY)
Abs Immature Granulocytes: 0.05 10*3/uL (ref 0.00–0.07)
Basophils Absolute: 0 10*3/uL (ref 0.0–0.1)
Basophils Relative: 0 %
Eosinophils Absolute: 0 10*3/uL (ref 0.0–0.5)
Eosinophils Relative: 0 %
HCT: 36.9 % (ref 36.0–46.0)
Hemoglobin: 11.5 g/dL — ABNORMAL LOW (ref 12.0–15.0)
Immature Granulocytes: 1 %
Lymphocytes Relative: 17 %
Lymphs Abs: 1.6 10*3/uL (ref 0.7–4.0)
MCH: 20 pg — ABNORMAL LOW (ref 26.0–34.0)
MCHC: 31.2 g/dL (ref 30.0–36.0)
MCV: 64.1 fL — ABNORMAL LOW (ref 80.0–100.0)
MONOS PCT: 9 %
Monocytes Absolute: 0.8 10*3/uL (ref 0.1–1.0)
NEUTROS PCT: 73 %
Neutro Abs: 6.7 10*3/uL (ref 1.7–7.7)
PLATELETS: 245 10*3/uL (ref 150–400)
RBC: 5.76 MIL/uL — ABNORMAL HIGH (ref 3.87–5.11)
RDW: 15.6 % — AB (ref 11.5–15.5)
WBC Count: 9.2 10*3/uL (ref 4.0–10.5)
nRBC: 0 % (ref 0.0–0.2)

## 2018-06-30 LAB — CBC WITH DIFFERENTIAL/PLATELET
Abs Immature Granulocytes: 0.1 10*3/uL — ABNORMAL HIGH (ref 0.00–0.07)
Basophils Absolute: 0 10*3/uL (ref 0.0–0.1)
Basophils Relative: 0 %
EOS PCT: 0 %
Eosinophils Absolute: 0 10*3/uL (ref 0.0–0.5)
HCT: 24.9 % — ABNORMAL LOW (ref 36.0–46.0)
Hemoglobin: 7.7 g/dL — ABNORMAL LOW (ref 12.0–15.0)
Immature Granulocytes: 1 %
Lymphocytes Relative: 9 %
Lymphs Abs: 1.1 10*3/uL (ref 0.7–4.0)
MCH: 20.5 pg — ABNORMAL LOW (ref 26.0–34.0)
MCHC: 30.9 g/dL (ref 30.0–36.0)
MCV: 66.4 fL — AB (ref 80.0–100.0)
MONOS PCT: 7 %
Monocytes Absolute: 0.8 10*3/uL (ref 0.1–1.0)
Neutro Abs: 10.7 10*3/uL — ABNORMAL HIGH (ref 1.7–7.7)
Neutrophils Relative %: 83 %
Platelets: 211 10*3/uL (ref 150–400)
RBC: 3.75 MIL/uL — ABNORMAL LOW (ref 3.87–5.11)
RDW: 15.6 % — ABNORMAL HIGH (ref 11.5–15.5)
WBC: 12.7 10*3/uL — ABNORMAL HIGH (ref 4.0–10.5)
nRBC: 0 % (ref 0.0–0.2)

## 2018-06-30 LAB — CMP (CANCER CENTER ONLY)
ALBUMIN: 3.4 g/dL — AB (ref 3.5–5.0)
ALT: 36 U/L (ref 0–44)
AST: 54 U/L — ABNORMAL HIGH (ref 15–41)
Alkaline Phosphatase: 111 U/L (ref 38–126)
Anion gap: 15 (ref 5–15)
BUN: 15 mg/dL (ref 8–23)
CO2: 24 mmol/L (ref 22–32)
Calcium: 9.8 mg/dL (ref 8.9–10.3)
Chloride: 98 mmol/L (ref 98–111)
Creatinine: 0.91 mg/dL (ref 0.44–1.00)
GFR, Est AFR Am: >60 mL/min (ref >60)
GFR, Estimated: >60 mL/min (ref >60)
GLUCOSE: 122 mg/dL — AB (ref 70–99)
Potassium: 3.8 mmol/L (ref 3.5–5.1)
Sodium: 137 mmol/L (ref 135–145)
Total Bilirubin: 1.1 mg/dL (ref 0.3–1.2)
Total Protein: 9.2 g/dL — ABNORMAL HIGH (ref 6.5–8.1)

## 2018-06-30 LAB — COMPREHENSIVE METABOLIC PANEL
ALT: 34 U/L (ref 0–44)
AST: 56 U/L — ABNORMAL HIGH (ref 15–41)
Albumin: 3 g/dL — ABNORMAL LOW (ref 3.5–5.0)
Alkaline Phosphatase: 77 U/L (ref 38–126)
Anion gap: 10 (ref 5–15)
BUN: 14 mg/dL (ref 8–23)
CO2: 21 mmol/L — ABNORMAL LOW (ref 22–32)
Calcium: 7.6 mg/dL — ABNORMAL LOW (ref 8.9–10.3)
Chloride: 103 mmol/L (ref 98–111)
Creatinine, Ser: 0.61 mg/dL (ref 0.44–1.00)
Glucose, Bld: 121 mg/dL — ABNORMAL HIGH (ref 70–99)
Potassium: 3.1 mmol/L — ABNORMAL LOW (ref 3.5–5.1)
Sodium: 134 mmol/L — ABNORMAL LOW (ref 135–145)
Total Bilirubin: 1.3 mg/dL — ABNORMAL HIGH (ref 0.3–1.2)
Total Protein: 7.2 g/dL (ref 6.5–8.1)

## 2018-06-30 LAB — URINALYSIS, COMPLETE (UACMP) WITH MICROSCOPIC
Bacteria, UA: NONE SEEN
Bilirubin Urine: NEGATIVE
Glucose, UA: NEGATIVE mg/dL
KETONES UR: 5 mg/dL — AB
Nitrite: NEGATIVE
Protein, ur: NEGATIVE mg/dL
Specific Gravity, Urine: 1.013 (ref 1.005–1.030)
pH: 6 (ref 5.0–8.0)

## 2018-06-30 LAB — LACTIC ACID, PLASMA
Lactic Acid, Venous: 0.9 mmol/L (ref 0.5–1.9)
Lactic Acid, Venous: 1 mmol/L (ref 0.5–1.9)

## 2018-06-30 LAB — BRAIN NATRIURETIC PEPTIDE: B Natriuretic Peptide: 126.9 pg/mL — ABNORMAL HIGH (ref 0.0–100.0)

## 2018-06-30 LAB — FOLATE: Folate: 22 ng/mL (ref >5.9)

## 2018-06-30 LAB — TROPONIN I
Troponin I: 0.05 ng/mL (ref <0.03)
Troponin I: 0.05 ng/mL (ref <0.03)

## 2018-06-30 LAB — IRON AND TIBC
Iron: 11 ug/dL — ABNORMAL LOW (ref 28–170)
Saturation Ratios: 8 % — ABNORMAL LOW (ref 10.4–31.8)
TIBC: 144 ug/dL — ABNORMAL LOW (ref 250–450)
UIBC: 133 ug/dL

## 2018-06-30 LAB — INFLUENZA PANEL BY PCR (TYPE A & B)
Influenza A By PCR: NEGATIVE
Influenza B By PCR: NEGATIVE

## 2018-06-30 LAB — FERRITIN: Ferritin: 705 ng/mL — ABNORMAL HIGH (ref 11–307)

## 2018-06-30 LAB — PROTIME-INR
INR: 1.54
Prothrombin Time: 18.4 seconds — ABNORMAL HIGH (ref 11.4–15.2)

## 2018-06-30 LAB — VITAMIN B12

## 2018-06-30 LAB — OCCULT BLOOD, POC DEVICE: Fecal Occult Bld: NEGATIVE

## 2018-06-30 SURGERY — SIGMOIDOSCOPY, FLEXIBLE
Anesthesia: IV Sedation (MBSC Only)

## 2018-06-30 MED ORDER — SODIUM CHLORIDE (PF) 0.9 % IJ SOLN
INTRAMUSCULAR | Status: AC
  Filled 2018-06-30: qty 50

## 2018-06-30 MED ORDER — ADULT MULTIVITAMIN W/MINERALS CH
1.0000 | ORAL_TABLET | Freq: Every day | ORAL | Status: DC
  Administered 2018-07-01 – 2018-07-07 (×6): 1 via ORAL
  Filled 2018-06-30 (×7): qty 1

## 2018-06-30 MED ORDER — SODIUM CHLORIDE 0.9 % IV SOLN
INTRAVENOUS | Status: DC
  Administered 2018-06-30 – 2018-07-07 (×11): via INTRAVENOUS

## 2018-06-30 MED ORDER — ONDANSETRON HCL 4 MG/2ML IJ SOLN: 4.0000 mg | Freq: Four times a day (QID) | INTRAMUSCULAR | Status: DC | PRN

## 2018-06-30 MED ORDER — SODIUM CHLORIDE 0.9% FLUSH
3.0000 mL | Freq: Once | INTRAVENOUS | Status: AC
  Administered 2018-06-30: 3 mL via INTRAVENOUS

## 2018-06-30 MED ORDER — SODIUM CHLORIDE 0.9 % IV SOLN: 2.0000 g | Freq: Once | INTRAVENOUS | Status: DC

## 2018-06-30 MED ORDER — VANCOMYCIN HCL IN DEXTROSE 1-5 GM/200ML-% IV SOLN
1000.0000 mg | Freq: Once | INTRAVENOUS | Status: AC
  Administered 2018-06-30: 1000 mg via INTRAVENOUS
  Filled 2018-06-30: qty 200

## 2018-06-30 MED ORDER — SENNOSIDES-DOCUSATE SODIUM 8.6-50 MG PO TABS: 1.0000 | ORAL_TABLET | Freq: Every evening | ORAL | Status: DC | PRN

## 2018-06-30 MED ORDER — METRONIDAZOLE IN NACL 5-0.79 MG/ML-% IV SOLN
500.0000 mg | Freq: Three times a day (TID) | INTRAVENOUS | Status: DC
  Administered 2018-06-30 (×2): 500 mg via INTRAVENOUS
  Filled 2018-06-30 (×2): qty 100

## 2018-06-30 MED ORDER — ONDANSETRON HCL 4 MG PO TABS: 4.0000 mg | ORAL_TABLET | Freq: Four times a day (QID) | ORAL | Status: DC | PRN

## 2018-06-30 MED ORDER — SODIUM CHLORIDE 0.9 % IV BOLUS
1000.0000 mL | Freq: Once | INTRAVENOUS | Status: AC
  Administered 2018-06-30: 1000 mL via INTRAVENOUS

## 2018-06-30 MED ORDER — IOPAMIDOL (ISOVUE-300) INJECTION 61%
100.0000 mL | Freq: Once | INTRAVENOUS | Status: AC | PRN
  Administered 2018-06-30: 80 mL via INTRAVENOUS

## 2018-06-30 MED ORDER — ACETAMINOPHEN 325 MG PO TABS
650.0000 mg | ORAL_TABLET | Freq: Once | ORAL | Status: AC
  Administered 2018-06-30: 650 mg via ORAL
  Filled 2018-06-30: qty 2

## 2018-06-30 MED ORDER — IOPAMIDOL (ISOVUE-300) INJECTION 61%
INTRAVENOUS | Status: AC
  Filled 2018-06-30: qty 100

## 2018-06-30 MED ORDER — FENTANYL CITRATE (PF) 100 MCG/2ML IJ SOLN
12.5000 ug | INTRAMUSCULAR | Status: AC | PRN
  Administered 2018-06-30 – 2018-07-01 (×2): 12.5 ug via INTRAVENOUS
  Filled 2018-06-30 (×2): qty 2

## 2018-06-30 MED ORDER — SODIUM CHLORIDE 0.9 % IV SOLN
Freq: Once | INTRAVENOUS | Status: AC
  Administered 2018-06-30: 11:00:00 via INTRAVENOUS
  Filled 2018-06-30: qty 250

## 2018-06-30 MED ORDER — VANCOMYCIN HCL IN DEXTROSE 1-5 GM/200ML-% IV SOLN: 1000.0000 mg | Freq: Once | INTRAVENOUS | Status: DC

## 2018-06-30 MED ORDER — SODIUM CHLORIDE 0.9 % IV SOLN
2.0000 g | Freq: Once | INTRAVENOUS | Status: AC
  Administered 2018-06-30: 2 g via INTRAVENOUS
  Filled 2018-06-30: qty 2

## 2018-06-30 MED ORDER — MIRTAZAPINE 15 MG PO TABS
30.0000 mg | ORAL_TABLET | Freq: Every day | ORAL | Status: DC
  Administered 2018-07-02 – 2018-07-06 (×5): 30 mg via ORAL
  Filled 2018-06-30 (×5): qty 2

## 2018-06-30 MED ORDER — SODIUM CHLORIDE 0.9 % IV SOLN
1.0000 g | Freq: Two times a day (BID) | INTRAVENOUS | Status: DC
  Filled 2018-06-30: qty 1

## 2018-06-30 MED ORDER — ACETAMINOPHEN 325 MG PO TABS
650.0000 mg | ORAL_TABLET | Freq: Once | ORAL | Status: AC
  Administered 2018-07-01: 650 mg via ORAL
  Filled 2018-06-30: qty 2

## 2018-06-30 MED ORDER — DEXTROSE 5 % IV SOLN
1.0000 g | Freq: Once | INTRAVENOUS | Status: DC
  Filled 2018-06-30: qty 10

## 2018-06-30 MED ORDER — POTASSIUM CHLORIDE 10 MEQ/100ML IV SOLN: 10.0000 meq | Freq: Once | INTRAVENOUS | Status: DC

## 2018-06-30 MED ORDER — DIPHENHYDRAMINE HCL 25 MG PO CAPS
25.0000 mg | ORAL_CAPSULE | Freq: Once | ORAL | Status: AC
  Administered 2018-07-01: 25 mg via ORAL
  Filled 2018-06-30: qty 1

## 2018-06-30 MED ORDER — VANCOMYCIN HCL IN DEXTROSE 750-5 MG/150ML-% IV SOLN: 750.0000 mg | INTRAVENOUS | Status: DC

## 2018-06-30 MED ORDER — ACETAMINOPHEN 325 MG PO TABS
650.0000 mg | ORAL_TABLET | Freq: Four times a day (QID) | ORAL | Status: DC | PRN
  Administered 2018-07-01 – 2018-07-06 (×4): 650 mg via ORAL
  Filled 2018-06-30 (×4): qty 2

## 2018-06-30 MED ORDER — SODIUM CHLORIDE 0.9% IV SOLUTION: Freq: Once | INTRAVENOUS | Status: AC

## 2018-06-30 MED ORDER — SODIUM CHLORIDE 0.9 % IV SOLN
4.0000 g | Freq: Once | INTRAVENOUS | Status: AC
  Administered 2018-06-30: 4 g via INTRAVENOUS
  Filled 2018-06-30: qty 8

## 2018-06-30 MED ORDER — MAGNESIUM OXIDE 400 (241.3 MG) MG PO TABS
400.0000 mg | ORAL_TABLET | Freq: Every day | ORAL | Status: DC
  Administered 2018-07-01 – 2018-07-07 (×6): 400 mg via ORAL
  Filled 2018-06-30 (×7): qty 1

## 2018-06-30 NOTE — ED Triage Notes (Addendum)
Patient was brought to the ED from the Cancer Center(Liza RN). Patient was given Magnesium 4 grams Iv prior to coming to the ED. T. 101 orally HR 125 BP-147/69.  Liza, RN reported that she was unable to withdraw blood from the patient's port and that blood ordered at the cancer center was not all drawn.

## 2018-06-30 NOTE — Progress Notes (Signed)
Sympt[facility] Management [facility] Progress Note   Jo Parker [id number] [birth year] 73 y.o.  Jo Parker is managed by Dr. [name]  Actively treated with chemotherapy/immunotherapy/hormonal therapy: no  Assessment: Plan:    Hypomagnesemia - Plan: Magnesium, magnesium sulfate 4 g in sodium chloride 0.9 % 1,000 mL  Dehydration - Plan: 0.9 %  sodium chloride infusion  Fever, unspecified fever cause - Plan: Urinalysis, Complete w Microscopic, Urine Culture, Culture, Blood, cefTRIAXone (ROCEPHIN) 1 g in dextrose 5 % 50 mL IVPB, Culture, Blood, Urine Culture, Urinalysis, Complete w Microscopic, CANCELED: Culture, Blood, CANCELED: Culture, Blood  Hypomagnesemia: The patient had a chemistry panel completed today which returned showing a magnesium of 1.6.  She has a history of hypomagnesemia.  She was given magnesium 4 g IV x1.  Dehydration: The patient was given 1 L of normal saline IV.  Fever: The patient spiked a fever of 101 while she was being seen today.  She has had a recent history of having a port access for an extended period of time as an outpatient with the cap of the catheter of the needle used to access the port removed.  The patient had blood cultures x2 collected.  A urine and urinalysis were collected.  The patient became markedly confused and was transferred to the emergency room for evaluation and management.  Please see After Visit Summary for patient specific instructions.  Future Appointments  Date Time Provider [place]  07/19/2018 12:15 PM [facility] LAB 4 [facility] None  07/19/2018 12:30 PM [facility] [place] FLUSH [facility] None  07/19/2018  2:00 PM [facility] DG 1 ([place]) [facility]  07/19/2018  2:00 PM [facility] IR [facility]  07/21/2018 11:45 AM [name], MD [facility] None    Orders Placed This Encounter  Procedures  . Urine Culture  . Culture, Blood  . Blood Culture ID Panel (Reflexed)  . Magnesium  . Urinalysis, Complete w  Microscopic       Subjective:   Patient ID:  Jo Parker is a 73 y.o. (DOB 26-Mar-1946) female.  Chief Complaint:  Chief Complaint  Patient presents with  . Fatigue    HPI Jo Parker   is a 73 year old female from [place],  [place] who is followed by her GYN oncologist there.  She also spends time with her son in [place].  She has been receiving IV fluids and IV magnesium at home through home health.  The patient decided to return to [place] approximately 1 month ago with out notifying our office.  Her port was accessed.  The cap to the catheter of the port needle was off.  She went to her oncologist in [place] and had her port de-accessed.  She presents to the office today with her son.  He reports that she has had pain in her left side, ribs, and back.  She has also had anorexia and fatigue.  She had a fall 1 week ago and hit her left head.  She is using a walker at home.  She appears to be more confused and is having greater weakness.  During her visit today she became febrile and appeared to be progressively confused.  Her son was not with her at the time.  He was contacted and told that the patient was being transported to the emergency room for evaluation and management.  Medications: I have reviewed the patient's current medications.  Allergies:  Allergies  Allergen Reactions  . Oxycodone Other (See Comments)    confusion  Past Medical History:  Diagnosis Date  . Acute on chronic respiratory failure with hypoxia ([place]) 03/07/2018  . Anxiety and depression   . Aortic aneurysm ([place])    see ov note 03/2017   . Arthritis   . Aspiration pneumonia ([place])   . 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 CHF (congestive heart failure) ([name])    diastolic dysfunction  . COPD (chronic obstructive pulmonary disease) ([place]) 10/23/2017  . Depression   . Family history of breast cancer   . Family history of prostate cancer   . Family history of uterine cancer   . History of  diverticulitis   . HLD (hyperlipidemia) 10/23/2017  . Hypercholesterolemia   . Hypertension   . Mitral valve prolapse   . Pelvic abscess in female   . Sleep apnea    cpap machine mask and tubing in [place] per patient.  Patient does not have mask or tubing to bring on DOS of 01/27/2018.   Marland Kitchen Uterine cancer ([place])    had partial hysterectomy at 27    Past Surgical History:  Procedure Laterality Date  . ABDOMINAL HYSTERECTOMY    . cardiac monitor implant    . CEREBRAL ANEURYSM REPAIR    . CERVICAL SPINE SURGERY     bone spurs  . DEBULKING N/A 01/27/2018   Procedure: RADICAL DEBULKING;  Surgeon: [name], MD;  Location: [facility];  Service: Gynecology;  Laterality: N/A;  . IR CATHETER TUBE CHANGE  06/13/2018  . IR FLUORO GUIDE PORT INSERTION RIGHT  10/14/2017  . IR PARACENTESIS  10/14/2017  . IR RADIOLOGIST EVAL & MGMT  04/26/2018  . IR RADIOLOGIST EVAL & MGMT  05/10/2018  . IR RADIOLOGIST EVAL & MGMT  05/25/2018  . IR US GUIDE VASC ACCESS RIGHT  10/14/2017  . LAPAROTOMY N/A 01/27/2018   Procedure: EXPLORATORY LAPAROTOMY WITH REMOVAL OF BILATERAL TUBES AND OVARIES;  Surgeon: [name], MD;  Location: [facility];  Service: Gynecology;  Laterality: N/A;  . LAPAROTOMY N/A 01/31/2018   Procedure: EXPLORATORY LAPAROTOMY, ABDOMINAL WASHOUT;  Surgeon: Isabel Caprice, MD;  Location: [facility];  Service: Gynecology;  Laterality: N/A;  . OMENTECTOMY N/A 01/27/2018   Procedure: OMENTECTOMY;  Surgeon: [name], MD;  Location: [facility];  Service: Gynecology;  Laterality: N/A;  . PARTIAL HYSTERECTOMY  1971   "precancer"    Family History  Problem Relation Age of Onset  . Endometrial cancer Sister 39       endometrial ca  . Endometrial cancer Paternal Aunt 80  . Breast cancer Paternal Aunt   . Stomach cancer Maternal Grandfather        d. 6s  . Prostate cancer Paternal Uncle   . Breast cancer Cousin        pat first cousin  . Breast cancer Cousin        pat first cousin's daughter  . Lung  cancer Father        lung ca  . Heart disease Mother   . Stroke Paternal Grandmother   . Endometrial cancer Cousin        paternal first cousin's daughter  . Breast cancer Other        niece dx less than 70s  . Breast cancer Other        niece, dx under 31    Social History   Socioeconomic History  . Marital status: Married    Spouse name: Not on file  . Number of children: 3  . Years of education: Not  on file  . Highest education level: Not on file  Occupational History  . Occupation: retired  Scientific laboratory technician  . Financial resource strain: Not on file  . Food insecurity:    Worry: Not on file    Inability: Not on file  . Transportation needs:    Medical: Not on file    Non-medical: Not on file  Tobacco Use  . Smoking status: Former Smoker    Packs/day: 1.00    Years: 55.00    Pack years: 55.00    Last attempt to quit: 10/06/2017    Years since quitting: 0.7  . Smokeless tobacco: Never Used  Substance and Sexual Activity  . Alcohol use: Never    Frequency: Never  . Drug use: Never  . Sexual activity: Not Currently  Lifestyle  . Physical activity:    Days per week: Not on file    Minutes per session: Not on file  . Stress: Not on file  Relationships  . Social connections:    Talks on phone: Not on file    Gets together: Not on file    Attends religious service: Not on file    Active member of club or organization: Not on file    Attends meetings of clubs or organizations: Not on file    Relationship status: Not on file  . Intimate partner violence:    Fear of current or ex partner: Not on file    Emotionally abused: Not on file    Physically abused: Not on file    Forced sexual activity: Not on file  Other Topics Concern  . Not on file  Social History Narrative  . Not on file    Past Medical History, Surgical history, Social history, and Family history were reviewed and updated as appropriate.   Please see review of systems for further details on the  patient's review from today.   Review of Systems:  Review of Systems  Constitutional: Positive for fatigue and fever. Negative for chills and diaphoresis.  HENT: Negative for trouble swallowing.   Respiratory: Negative for cough, choking, chest tightness, shortness of breath, wheezing and stridor.   Cardiovascular: Negative for chest pain and palpitations.  Neurological: Positive for weakness.  Psychiatric/Behavioral: Positive for confusion.    Objective:   Physical Exam:  BP (!) 154/81 (BP Location: Right Arm, Patient Position: Sitting)   Pulse (!) 105   Temp (!) 101 F (38.3 C) Comment: PA Willliam Pettet aware  Resp 20   Ht 5\' 3"  (1.6 m)   Wt 102 lb 5 oz (46.4 kg)   SpO2 98%   BMI 18.12 kg/m  ECOG: 2  Physical Exam Constitutional:      General: She is not in acute distress.    Appearance: She is not diaphoretic.     Comments: The patient is an elderly female who appears to be chronically ill and intermittently confused.  HENT:     Mouth/Throat:     Pharynx: No oropharyngeal exudate.  Cardiovascular:     Rate and Rhythm: Regular rhythm. Tachycardia present.     Heart sounds: Normal heart sounds. No murmur. No friction rub. No gallop.   Pulmonary:     Effort: Pulmonary effort is normal. No respiratory distress.     Breath sounds: Normal breath sounds. No wheezing or rales.  Skin:    General: Skin is warm and dry.     Findings: No erythema or rash.  Neurological:     Gait: Gait abnormal (The  patient is ambulating with the use of a wheelchair.).     Lab Review:     Component Value Date/Time   NA 137 07/01/2018 0852   K 3.3 (L) 07/01/2018 0852   CL 108 07/01/2018 0852   CO2 22 07/01/2018 0852   GLUCOSE 113 (H) 07/01/2018 0852   BUN 13 07/01/2018 0852   CREATININE 0.69 07/01/2018 0852   CREATININE 0.91 06/30/2018 0924   CALCIUM 7.8 (L) 07/01/2018 0852   PROT 7.2 06/30/2018 1704   ALBUMIN 3.0 (L) 06/30/2018 1704   AST 56 (H) 06/30/2018 1704   AST 54 (H) 06/30/2018 0924    ALT 34 06/30/2018 1704   ALT 36 06/30/2018 0924   ALKPHOS 77 06/30/2018 1704   BILITOT 1.3 (H) 06/30/2018 1704   BILITOT 1.1 06/30/2018 0924   GFRNONAA >60 07/01/2018 0852   GFRNONAA >60 06/30/2018 0924   GFRAA >60 07/01/2018 0852   GFRAA >60 06/30/2018 0924       Component Value Date/Time   WBC 9.7 07/01/2018 0852   RBC 4.20 07/01/2018 0852   HGB 9.2 (L) 07/01/2018 0852   HGB 11.5 (L) 06/30/2018 0924   HCT 29.5 (L) 07/01/2018 0852   HCT 29.6 (L) 10/15/2017 1009   PLT 175 07/01/2018 0852   PLT 245 06/30/2018 0924   MCV 70.2 (L) 07/01/2018 0852   MCH 21.9 (L) 07/01/2018 0852   MCHC 31.2 07/01/2018 0852   RDW 19.6 (H) 07/01/2018 0852   LYMPHSABS 1.1 06/30/2018 1704   MONOABS 0.8 06/30/2018 1704   EOSABS 0.0 06/30/2018 1704   BASOSABS 0.0 06/30/2018 1704   -------------------------------  Imaging from last 24 hours (if applicable):  Radiology interpretation: Dg Chest 2 View  Result Date: 06/30/2018 CLINICAL DATA:  Fever.  History of uterine cancer. EXAM: CHEST - 2 VIEW COMPARISON:  March 12, 2018 FINDINGS: The right Port-A-Cath is stable. No pneumothorax. Possible mild pulmonary venous congestion. No overt edema or focal infiltrate. The cardiomediastinal silhouette is unremarkable. No pneumothorax. Probable tiny pleural effusions. IMPRESSION: Tiny pleural effusions and possible mild pulmonary venous congestion. No overt edema or focal infiltrate. Electronically Signed   By: Dorise Bullion III M.D   On: 06/30/2018 15:58   Ct Head Wo Contrast  Result Date: 06/30/2018 CLINICAL DATA:  73 year old female with headache and confusion following fall and head injury 1 week ago. EXAM: CT HEAD WITHOUT CONTRAST TECHNIQUE: Contiguous axial images were obtained from the base of the skull through the vertex without intravenous contrast. COMPARISON:  03/03/2018 and prior CTs FINDINGS: Brain: No evidence of acute infarction, hemorrhage, hydrocephalus, extra-axial collection or mass  lesion/mass effect. Atrophy, chronic small-vessel white matter ischemic changes and LEFT aneurysm clip again noted. Vascular: Carotid atherosclerotic calcifications again noted. Skull: No acute abnormality. LEFT temporoparietal craniotomy/craniectomy noted. Sinuses/Orbits: No acute abnormality Other: None IMPRESSION: 1. No evidence of acute intracranial abnormality 2. Atrophy and chronic small-vessel white matter ischemic changes. LEFT-sided aneurysm clip again noted. Electronically Signed   By: Margarette Canada M.D.   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 Electronically Signed   By: Margarette Canada M.D.   On: 07/01/2018 10:19   Result Date: 07/01/2018 CLINICAL DATA:  73 year old female with abdominal and pelvic pain, fever and nausea. Recent LEFT pelvic abscess and percutaneous drain. EXAM: CT ABDOMEN AND PELVIS WITH CONTRAST TECHNIQUE: Multidetector CT imaging  of the abdomen and pelvis was performed using the standard protocol following bolus administration of intravenous contrast. CONTRAST:  44mL ISOVUE-300 IOPAMIDOL (ISOVUE-300) INJECTION 61% COMPARISON:  06/13/2018 percutaneous drain replacement, 05/10/2018 CT and prior studies FINDINGS: Lower chest: Mild cardiomegaly and new small RIGHT pleural effusion noted. Hepatobiliary: The liver and gallbladder are unremarkable. No biliary dilatation. Pancreas: Unremarkable Spleen: Unremarkable Adrenals/Urinary Tract: The kidneys, adrenal glands and bladder are unremarkable except for a LEFT renal cyst. Stomach/Bowel: There is no evidence of bowel obstruction or definite bowel wall thickening. Colonic diverticulosis noted without definite evidence of diverticulitis. 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 Aortic atherosclerotic  calcifications are again noted. Reproductive: Status post hysterectomy. No adnexal masses. Other: Retroperitoneal and mesenteric edema identified. No evidence of ascites or pneumoperitoneum. A percutaneous drainage catheter within the LEFT pelvis is unchanged without residual abscess. Musculoskeletal: No acute or suspicious bony abnormalities identified. IMPRESSION: 1. New small RIGHT pleural effusion and nonspecific retroperitoneal/mesenteric edema. 2. LEFT pelvic drainage catheter again noted - no evidence of residual abscess. 3. 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 4. Cardiomegaly. 5.  Aortic Atherosclerosis (ICD10-I70.0). Electronically Signed: By: Margarette Canada M.D. On: 06/30/2018 19:36   Ir Catheter Tube Change  Result Date: 06/13/2018 INDICATION: 73 year old female with a history of left pelvic sidewall abscess secondary to diverticulitis and a persistent fistulous communication between the catheter in the sigmoid colon. Patient presents for tube exchange and repositioning. EXAM: IR CATHETER TUBE CHANGE MEDICATIONS: None ANESTHESIA/SEDATION: None COMPLICATIONS: None immediate. PROCEDURE: Informed written consent was obtained from the patient after a thorough discussion of the procedural risks, benefits and alternatives. All questions were addressed. Maximal Sterile Barrier Technique was utilized including caps, mask, sterile gowns, sterile gloves, sterile drape, hand hygiene and skin antiseptic. A timeout was performed prior to the initiation of the procedure. A gentle hand injection of contrast material was performed. There is a persistent small fistulous communication with the adjacent sigmoid colon. The catheter was cut and removed over a short Amplatz wire. A new 10 [place] cook all-purpose drainage catheter was then advanced over the wire and formed. The new catheter was positioned slightly further away from the site of fistulous communication compared to the existing catheter.  A gentle hand injection of contrast material was performed. No definite filling of the fistula with a low pressure hand injection. The catheter was then connected to gravity drainage and secured to the skin with an adhesive fixation device. The patient tolerated the procedure well. IMPRESSION: 1. 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 2. Successful exchange for a new French drainage catheter which was positioned slightly farther away from the site of fistulization. PLAN: Return to drain clinic on approximately February 10th (patient request) for repeat drain injection. Electronically Signed   By: Jacqulynn Cadet M.D.   On: 06/13/2018 13:44        This case was discussed with Dr. Alvy Bimler. She expresses agreement with my management of this patient.

## 2018-06-30 NOTE — ED Provider Notes (Signed)
[facility] DEPT Provider Note   CSN: [id number] Arrival date & time: 06/30/18  1506     History   Chief Complaint Chief Complaint  Patient presents with  . Fever  . possible sepsis    HPI Jo Parker is a 73 y.o. female.  The history is provided by the patient and a relative. History limited by: AMS.  Fever    Pt was seen at 1615. Per pt and her son:  c/o gradual onset and worsening of persistent generalized weakness and intermittent confusion for the past 2 weeks. Pt son states "she has just been in a general state of decline." Pt's son states pt sustained a fall approximately 1 week ago, hitting her head and left side. Pt states she has been having multiple episodes of N/V for the past 1 week and "maybe a cough." Pt was evaluated at her Onc office today, where it was noted she had fever to "101." IVF NS 1.5L and magnesium 4gm given, UA/UC obtained (Udip negative), then she was sent to the ED for further evaluation. Denies CP/SOB, no diarrhea, no abd pain, no rash, no focal motor weakness, no black or blood in stools or emesis.     Past Medical History:  Diagnosis Date  . Acute on chronic respiratory failure with hypoxia ([place]) 03/07/2018  . Anxiety and depression   . Aortic aneurysm ([place])    see ov note 03/2017   . Arthritis   . Aspiration pneumonia ([place])   . Carcinomatosis ([place]) 10/2017  . CHF (congestive heart failure) ([name])    diastolic dysfunction  . COPD (chronic obstructive pulmonary disease) ([place]) 10/23/2017  . Depression   . Family history of breast cancer   . Family history of prostate cancer   . Family history of uterine cancer   . History of diverticulitis   . HLD (hyperlipidemia) 10/23/2017  . Hypercholesterolemia   . Hypertension   . Mitral valve prolapse   . Pelvic abscess in female   . Sleep apnea    cpap machine mask and tubing in [place] per patient.  Patient does not have mask or tubing to bring on DOS of 01/27/2018.    Marland Kitchen Uterine cancer [facility])    had partial hysterectomy at 27    Patient Active Problem List   Diagnosis Date Noted  . Preventive measure 04/05/2018  . Dehydration 03/30/2018  . Hypokalemia, inadequate intake 03/30/2018  . Hypotension 03/30/2018  . Acute on chronic respiratory failure with hypoxia ([place]) 03/07/2018  . Tracheostomy care ([place])   . Wound dehiscence, surgical, initial encounter   . Pelvic abscess in female   . Aspiration pneumonia ([place])   . Postoperative wound infection 02/10/2018  . Status post bilateral salpingo-oophorectomy 02/09/2018  . Peritonitis ([place]) 02/09/2018  . HCAP (healthcare-associated pneumonia) 02/09/2018  . Pressure injury of skin 02/05/2018  . Acute on chronic respiratory failure ([place])   . Genetic testing 02/01/2018  . BRCA2 gene mutation positive 02/01/2018  . Free intraperitoneal air   . Acute respiratory distress 01/30/2018  . Low urine output   . Somnolence   . At risk for fluid volume overload   . AKI (acute kidney injury) ([place]) 01/29/2018  . Acute metabolic encephalopathy 17/61/6073  . Ovarian cancer ([place]) 01/27/2018  . History of uterine cancer 01/26/2018  . Family history of breast cancer   . Family history of uterine cancer   . Family history of prostate cancer   . Sinusitis, acute 12/20/2017  . Peripheral  neuropathy due to chemotherapy ([place]) 12/20/2017  . Anemia due to antineoplastic chemotherapy 12/20/2017  . White coat syndrome with hypertension 12/20/2017  . Hypomagnesemia   . Absolute anemia   . Malnutrition of moderate degree 10/26/2017  . Severe sepsis ([place]) 10/23/2017  . COPD (chronic obstructive pulmonary disease) ([place]) 10/23/2017  . HLD (hyperlipidemia) 10/23/2017  . Goals of care, counseling/discussion 10/13/2017  . Deficiency anemia 10/11/2017  . Iron deficiency anemia 10/08/2017  . Severe protein-calorie malnutrition ([place]) 10/08/2017  . 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 ABDOMINAL HYSTERECTOMY    . cardiac monitor implant    . CEREBRAL ANEURYSM REPAIR    . CERVICAL SPINE SURGERY     bone spurs  . DEBULKING N/A 01/27/2018   Procedure: RADICAL DEBULKING;  Surgeon: [name], MD;  Location: [facility];  Service: Gynecology;  Laterality: N/A;  . IR CATHETER TUBE CHANGE  06/13/2018  . IR FLUORO GUIDE PORT INSERTION RIGHT  10/14/2017  . IR PARACENTESIS  10/14/2017  . IR RADIOLOGIST EVAL & MGMT  04/26/2018  . IR RADIOLOGIST EVAL & MGMT  05/10/2018  . IR RADIOLOGIST EVAL & MGMT  05/25/2018  . IR US GUIDE VASC ACCESS RIGHT  10/14/2017  . LAPAROTOMY N/A 01/27/2018   Procedure: EXPLORATORY LAPAROTOMY WITH REMOVAL OF BILATERAL TUBES AND OVARIES;  Surgeon: [name], MD;  Location: [facility];  Service: Gynecology;  Laterality: N/A;  . LAPAROTOMY N/A 01/31/2018   Procedure: EXPLORATORY LAPAROTOMY, ABDOMINAL WASHOUT;  Surgeon: Isabel Caprice, MD;  Location: [facility];  Service: Gynecology;  Laterality: N/A;  . OMENTECTOMY N/A 01/27/2018   Procedure: OMENTECTOMY;  Surgeon: [name], MD;  Location: [facility];  Service: Gynecology;  Laterality: N/A;  . PARTIAL HYSTERECTOMY  1971   "precancer"     OB History   No obstetric history on file.      Home Medications    Prior to Admission medications   Medication Sig Start Date End Date Taking? Authorizing Provider  acetaminophen (TYLENOL) 325 MG tablet Take 2 tablets (650 mg total) by mouth every 6 (six) hours as needed for mild pain, fever or headache. 03/04/18  Yes Cross, Melissa D, NP  carvedilol (COREG) 12.5 MG tablet Take 12.5 mg by mouth 2 (two) times daily with a meal.   Yes [provider]  lidocaine-prilocaine (EMLA) cream Apply 1 application topically as needed. Patient taking differently: Apply 1 application topically as needed (port access).  03/31/18  Yes Gorsuch, Ni, MD  magnesium oxide (MAG-OX) 400 MG tablet Take 400 mg by mouth daily.   Yes [provider]   mirtazapine (REMERON) 30 MG tablet Take 1 tablet (30 mg total) by mouth at bedtime. 05/13/18  Yes [name], MD  Multiple Vitamin (MULTIVITAMIN WITH MINERALS) TABS tablet Take 1 tablet by mouth daily.   Yes [provider]    Family History Family History  Problem Relation Age of Onset  . Endometrial cancer Sister 79       endometrial ca  . Endometrial cancer Paternal Aunt 80  . Breast cancer Paternal Aunt   . Stomach cancer Maternal Grandfather        d. 64s  . Prostate cancer Paternal Uncle   . Breast cancer Cousin        pat first cousin  . Breast cancer Cousin        pat first cousin's daughter  . Lung cancer Father  lung ca  . Heart disease Mother   . Stroke Paternal Grandmother   . Endometrial cancer Cousin        paternal first cousin's daughter  . Breast cancer Other        niece dx less than 45s  . Breast cancer Other        niece, dx under 94    Social History Social History   Tobacco Use  . Smoking status: Former Smoker    Packs/day: 1.00    Years: 55.00    Pack years: 55.00    Last attempt to quit: 10/06/2017    Years since quitting: 0.7  . Smokeless tobacco: Never Used  Substance Use Topics  . Alcohol use: Never    Frequency: Never  . Drug use: Never     Allergies   Oxycodone   Review of Systems Review of Systems  Unable to perform ROS: Mental status change  Constitutional: Positive for fever.     Physical Exam Updated Vital Signs BP (!) 143/127 (BP Location: Left Arm)   Pulse (!) 112   Temp 98.9 F (37.2 C) (Oral)   Resp 20   Ht _0  (1.6 m)   Wt 46.4 kg   SpO2 100%   BMI 18.12 kg/m   Physical Exam 1620: Physical examination:  Nursing notes reviewed; Vital signs and O2 SAT reviewed;  Constitutional: Well developed, Well hydrated, In no acute distress; Head:  Normocephalic, atraumatic; Eyes: EOMI, PERRL, No scleral icterus; ENMT: Mouth and pharynx normal, Mucous membranes dry; Neck: Supple, Full range of motion,  No lymphadenopathy; Cardiovascular: Tachycardic rate and rhythm, No gallop; Respiratory: Breath sounds clear & equal bilaterally, No wheezes.  Speaking sentences, Normal respiratory effort/excursion; Chest: Nontender, Movement normal; Abdomen: Soft, +diffuse tenderness to palp. Nondistended, Normal bowel sounds. +catheter LLQ.; Genitourinary: No CVA tenderness; Extremities: Peripheral pulses normal, No tenderness, No edema, No calf edema or asymmetry.; Neuro: Awake, alert, confused re: events, time. No facial droop. Speech slow to respond, clear. Grips equal. Moves all extremities spontaneously and to command without apparent gross focal motor deficits.; Skin: Color normal, Warm, Dry.   ED Treatments / Results  Labs (all labs ordered are listed, but only abnormal results are displayed)   EKG None  Radiology   Procedures Procedures (including critical care time)  Medications Ordered in ED Medications  sodium chloride flush (NS) 0.9 % injection 3 mL (has no administration in time range)  ceFEPIme (MAXIPIME) 2 g in sodium chloride 0.9 % 100 mL IVPB (has no administration in time range)  metroNIDAZOLE (FLAGYL) IVPB 500 mg (has no administration in time range)  vancomycin (VANCOCIN) IVPB 1000 mg/200 mL premix (has no administration in time range)  acetaminophen (TYLENOL) tablet 650 mg (has no administration in time range)  0.9 %  sodium chloride infusion (has no administration in time range)  iopamidol (ISOVUE-300) 61 % injection 100 mL (has no administration in time range)     Initial Impression / Assessment and Plan / ED Course  I have reviewed the triage vital signs and the nursing notes.  Pertinent labs & imaging results that were available during my care of the patient were reviewed by me and considered in my medical decision making (see chart for details).  MDM Reviewed: previous chart, nursing note and vitals Reviewed previous: labs and ECG Interpretation: labs, ECG, x-ray and  CT scan Total time providing critical care: 30-74 minutes. This excludes time spent performing separately reportable procedures and services. Consults: admitting MD  CRITICAL CARE Performed by: Francine Graven Total critical care time: 35 minutes Critical care time was exclusive of separately billable procedures and treating other patients. Critical care was necessary to treat or prevent imminent or life-threatening deterioration. Critical care was time spent personally by me on the following activities: development of treatment plan with patient and/or surrogate as well as nursing, discussions with consultants, evaluation of patient's response to treatment, examination of patient, obtaining history from patient or surrogate, ordering and performing treatments and interventions, ordering and review of laboratory studies, ordering and review of radiographic studies, pulse oximetry and re-evaluation of patient's condition.   ED ECG REPORT   Date: 06/30/2018  Rate: 106  Rhythm: sinus tachycardia  QRS Axis: left  Intervals: normal  ST/T Wave abnormalities: nonspecific ST changes  Conduction Disutrbances:left anterior fascicular block  Narrative Interpretation:   Old EKG Reviewed: unchanged; no significant changes from previous EKG dated 02/07/2018 I have personally reviewed the EKG tracing and agree with the computerized printout as noted.   Results for orders placed or performed in visit on 06/30/18  Urinalysis, Complete w Microscopic  Result Value Ref Range   Color, Urine YELLOW YELLOW   APPearance CLEAR CLEAR   Specific Gravity, Urine 1.013 1.005 - 1.030   pH 6.0 5.0 - 8.0   Glucose, UA NEGATIVE NEGATIVE mg/dL   Hgb urine dipstick SMALL (A) NEGATIVE   Bilirubin Urine NEGATIVE NEGATIVE   Ketones, ur 5 (A) NEGATIVE mg/dL   Protein, ur NEGATIVE NEGATIVE mg/dL   Nitrite NEGATIVE NEGATIVE   Leukocytes, UA SMALL (A) NEGATIVE   RBC / HPF 0-5 0 - 5 RBC/hpf   WBC, UA 0-5 0 - 5 WBC/hpf    Bacteria, UA NONE SEEN NONE SEEN   Squamous Epithelial / LPF 6-10 0 - 5   Mucus PRESENT    Results for orders placed or performed during the hospital encounter of 06/30/18  Comprehensive metabolic panel  Result Value Ref Range   Sodium 134 (L) 135 - 145 mmol/L   Potassium 3.1 (L) 3.5 - 5.1 mmol/L   Chloride 103 98 - 111 mmol/L   CO2 21 (L) 22 - 32 mmol/L   Glucose, Bld 121 (H) 70 - 99 mg/dL   BUN 14 8 - 23 mg/dL   Creatinine, Ser 0.61 0.44 - 1.00 mg/dL   Calcium 7.6 (L) 8.9 - 10.3 mg/dL   Total Protein 7.2 6.5 - 8.1 g/dL   Albumin 3.0 (L) 3.5 - 5.0 g/dL   AST 56 (H) 15 - 41 U/L   ALT 34 0 - 44 U/L   Alkaline Phosphatase 77 38 - 126 U/L   Total Bilirubin 1.3 (H) 0.3 - 1.2 mg/dL   GFR calc non Af Amer >60 >60 mL/min   GFR calc Af Amer >60 >60 mL/min   Anion gap 10 5 - 15  Lactic acid, plasma  Result Value Ref Range   Lactic Acid, Venous 0.9 0.5 - 1.9 mmol/L  CBC with Differential  Result Value Ref Range   WBC 12.7 (H) 4.0 - 10.5 K/uL   RBC 3.75 (L) 3.87 - 5.11 MIL/uL   Hemoglobin 7.7 (L) 12.0 - 15.0 g/dL   HCT 24.9 (L) 36.0 - 46.0 %   MCV 66.4 (L) 80.0 - 100.0 fL   MCH 20.5 (L) 26.0 - 34.0 pg   MCHC 30.9 30.0 - 36.0 g/dL   RDW 15.6 (H) 11.5 - 15.5 %   Platelets 211 150 - 400 K/uL   nRBC 0.0 0.0 -  0.2 %   Neutrophils Relative % 83 %   Neutro Abs 10.7 (H) 1.7 - 7.7 K/uL   Lymphocytes Relative 9 %   Lymphs Abs 1.1 0.7 - 4.0 K/uL   Monocytes Relative 7 %   Monocytes Absolute 0.8 0.1 - 1.0 K/uL   Eosinophils Relative 0 %   Eosinophils Absolute 0.0 0.0 - 0.5 K/uL   Basophils Relative 0 %   Basophils Absolute 0.0 0.0 - 0.1 K/uL   Immature Granulocytes 1 %   Abs Immature Granulocytes 0.10 (H) 0.00 - 0.07 K/uL  Protime-INR  Result Value Ref Range   Prothrombin Time 18.4 (H) 11.4 - 15.2 seconds   INR 1.54   Magnesium  Result Value Ref Range   Magnesium 2.5 (H) 1.7 - 2.4 mg/dL  Troponin I - ONCE - STAT  Result Value Ref Range   Troponin I 0.05 (HH) <0.03 ng/mL   Brain natriuretic peptide  Result Value Ref Range   B Natriuretic Peptide 126.9 (H) 0.0 - 100.0 pg/mL    Ir Catheter Tube Change Result Date: 06/13/2018 INDICATION: 73 year old female with a history of left pelvic sidewall abscess secondary to diverticulitis and a persistent fistulous communication between the catheter in the sigmoid colon. Patient presents for tube exchange and repositioning. EXAM: IR CATHETER TUBE CHANGE MEDICATIONS: None ANESTHESIA/SEDATION: None COMPLICATIONS: None immediate. PROCEDURE: Informed written consent was obtained from the patient after a thorough discussion of the procedural risks, benefits and alternatives. All questions were addressed. Maximal Sterile Barrier Technique was utilized including caps, mask, sterile gowns, sterile gloves, sterile drape, hand hygiene and skin antiseptic. A timeout was performed prior to the initiation of the procedure. A gentle hand injection of contrast material was performed. There is a persistent small fistulous communication with the adjacent sigmoid colon. The catheter was cut and removed over a short Amplatz wire. A new 10 [place] cook all-purpose drainage catheter was then advanced over the wire and formed. The new catheter was positioned slightly further away from the site of fistulous communication compared to the existing catheter. A gentle hand injection of contrast material was performed. No definite filling of the fistula with a low pressure hand injection. The catheter was then connected to gravity drainage and secured to the skin with an adhesive fixation device. The patient tolerated the procedure well. IMPRESSION: 1. 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 2. Successful exchange for a new French drainage catheter which was positioned slightly farther away from the site of fistulization. PLAN: Return to drain clinic on approximately February 10th (patient request) for repeat drain  injection. Electronically Signed   By: Jacqulynn Cadet M.D.   On: 06/13/2018 13:44    Dg Chest 2 View Result Date: 06/30/2018 CLINICAL DATA:  Fever.  History of uterine cancer. EXAM: CHEST - 2 VIEW COMPARISON:  March 12, 2018 FINDINGS: The right Port-A-Cath is stable. No pneumothorax. Possible mild pulmonary venous congestion. No overt edema or focal infiltrate. The cardiomediastinal silhouette is unremarkable. No pneumothorax. Probable tiny pleural effusions. IMPRESSION: Tiny pleural effusions and possible mild pulmonary venous congestion. No overt edema or focal infiltrate. Electronically Signed   By: Dorise Bullion III M.D   On: 06/30/2018 15:58    Ct Abdomen Pelvis W Contrast Result Date: 06/30/2018 CLINICAL DATA:  73 year old female with abdominal and pelvic pain, fever and nausea. Recent LEFT pelvic abscess and percutaneous drain. EXAM: CT ABDOMEN AND PELVIS WITH CONTRAST TECHNIQUE: Multidetector CT imaging of the abdomen and pelvis was performed using  the standard protocol following bolus administration of intravenous contrast. CONTRAST:  56m ISOVUE-300 IOPAMIDOL (ISOVUE-300) INJECTION 61% COMPARISON:  06/13/2018 percutaneous drain replacement, 05/10/2018 CT and prior studies FINDINGS: Lower chest: Mild cardiomegaly and new small RIGHT pleural effusion noted. Hepatobiliary: The liver and gallbladder are unremarkable. No biliary dilatation. Pancreas: Unremarkable Spleen: Unremarkable Adrenals/Urinary Tract: The kidneys, adrenal glands and bladder are unremarkable except for a LEFT renal cyst. Stomach/Bowel: There is no evidence of bowel obstruction or definite bowel wall thickening. Colonic diverticulosis noted without definite evidence of diverticulitis. 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 Aortic atherosclerotic calcifications are again noted. Reproductive: Status post hysterectomy. No adnexal masses. Other: Retroperitoneal and mesenteric edema  identified. No evidence of ascites or pneumoperitoneum. A percutaneous drainage catheter within the LEFT pelvis is unchanged without residual abscess. Musculoskeletal: No acute or suspicious bony abnormalities identified. IMPRESSION: 1. New small RIGHT pleural effusion and nonspecific retroperitoneal/mesenteric edema. 2. LEFT pelvic drainage catheter again noted - no evidence of residual abscess. 3. 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 4. Cardiomegaly. 5.  Aortic Atherosclerosis (ICD10-I70.0). Electronically Signed   By: JMargarette CanadaM.D.   On: 06/30/2018 19:36    2010:  Code Sepsis [place]no UTI on Udip). APAP given for fever. [facility]. No hypotension while in the ED. Potassium repleted by IV. H/H lower than previous; scant mucus/no stool in rectal vault: heme negative. Flu pending. Dx and testing d/w pt and family.  Questions answered.  Verb understanding, agreeable to admit.  T/C returned from [facility]. JWynetta Emery case discussed, including:  HPI, pertinent PM/SHx, VS/PE, dx testing, ED course and treatment:  Agreeable to admit.      Final Clinical Impressions(s) / ED Diagnoses   Final diagnoses:  None    ED Discharge Orders    None       MFrancine Graven DO 07/04/18 1333

## 2018-06-30 NOTE — Telephone Encounter (Signed)
[name] left a message saying that Jo Parker got home last night and is in "no state to do the procedure today."  He said she is in a "state of decline."  He wants to know if she can be seen right away this morning.  Called him back and left a message advising him that Dr. Alvy Bimler recommends that she go to the procedure so that Ivana can talk to Dr. Marcello Moores.  Also asked if they would like an appointment with Sandi Mealy, PA tomorrow after her lab work.

## 2018-06-30 NOTE — Progress Notes (Signed)
Talked with Pamala Hurry and [name] in the Symptom Management clinic.  Advised them that fluids through [facility] has been stopped due to safety issues with Dajahnae spending long periods of time in [place].  They both verbalized agreement.  Also discussed that they have bottles of potassium and magnesium that they would like to return to [place].  Advised them I would contact Pam with [facility] to see how to return them.

## 2018-06-30 NOTE — Telephone Encounter (Signed)
Jo Parker called back and said Jo Parker is very weak, like when she first got out of the hospital.  He said he is very angry with his step dad and doesn't understand why he didn't do anything while she was in [place].  He said she did have a fall in [place] as well.  He called Dr. Manon Hilding office and canceled the procedure today and she was too weak to do the prep last night.  Offered him the option to have labs done and to see Sandi Mealy, PA with the symptom management clinic this morning or go to the ER.  He said he would like to have [facility] evaluate her.  Scheduling message sent and Jo Parker was notified of appointment time.

## 2018-06-30 NOTE — ED Notes (Signed)
Date and time results received: 06/30/18 6:33 PM  (use smartphrase ".now" to insert current time)  Test: Troponin Critical Value: 0.05  Name of Provider Notified: Thurnell Garbe  Orders Received? Or Actions Taken?: awaiting orders

## 2018-06-30 NOTE — Patient Instructions (Signed)

## 2018-06-30 NOTE — Progress Notes (Signed)
A consult was received from an ED physician for vancomycin and cefepime per pharmacy dosing.  The patient's profile has been reviewed for ht/wt/allergies/indication/available labs.   A one time order has been placed for cefepime 2 gm, vancomycin 1 gm.   Further antibiotics/pharmacy consults should be ordered by admitting physician if indicated.                       Thank you, Eudelia Bunch, Pharm.D 06/30/2018 4:15 PM

## 2018-06-30 NOTE — ED Notes (Signed)
Was unable to collect blood from any site other than the port. In order to prevent any further delay in pt care antibiotics will be started.

## 2018-06-30 NOTE — Progress Notes (Signed)
Pharmacy Antibiotic Note  Jo Parker is a 73 y.o. female admitted on 06/30/2018 with sepsis.  Pharmacy has been consulted for vancomycin and cefepime dosing.  Plan:  Vancomycin 1000 mg IV x1 in ED then 750 mg IV q24h  Cefepime 2 gr IV x1, then 1 gr IV q12h   Monitor clinical course, renal function, cultures as available   Height: 5\' 3"  (160 cm) Weight: 102 lb 5 oz (46.4 kg) IBW/kg (Calculated) : 52.4  Temp (24hrs), Avg:99.3 F (37.4 C), Min:97.8 F (36.6 C), Max:101 F (38.3 C)  Recent Labs  Lab 06/30/18 0924 06/30/18 1704  WBC 9.2 12.7*  CREATININE 0.91 0.61  LATICACIDVEN  --  0.9    Estimated Creatinine Clearance: 45.9 mL/min (by C-G formula based on SCr of 0.61 mg/dL).    Allergies  Allergen Reactions  . Oxycodone Other (See Comments)    confusion    Antimicrobials this admission: 1/23 vancomycin >> 1/23 cefepime >> 1/23 flagyl>>  Dose adjustments this admission:   Microbiology results: 1/23 BCx .> 1/23 Ucx >> 1/23 influenza panel >> negative  Thank you for allowing pharmacy to be a part of this patient's care.  Royetta Asal, PharmD, BCPS Pager [phone] 06/30/2018 10:15 PM

## 2018-06-30 NOTE — ED Notes (Signed)
ED TO INPATIENT HANDOFF REPORT  Name/Age/Gender Jo Parker 73 y.o. female  Code Status Code Status History    Date Active Date Inactive Code Status Order ID Comments User Context   03/04/2018 1333 03/23/2018 1820 Full Code [id number]  Frankey Poot Inpatient   02/13/2018 1315 03/04/2018 1148 Full Code [id number]  Marshell Garfinkel, MD Inpatient   02/13/2018 0949 02/13/2018 1315 DNR [id number]  Marshell Garfinkel, MD Inpatient   01/27/2018 1244 02/13/2018 0949 Full Code [id number]  Lahoma Crocker, MD Inpatient   10/23/2017 0859 10/27/2017 1655 Full Code [id number]  Purohit, Konrad Dolores, MD ED    Advance Directive Documentation     Most Recent Value  Type of Advance Directive  Healthcare Power of Attorney  Pre-existing out of facility DNR order (yellow form or pink MOST form)  -  "MOST" Form in Place?  -      Home/SNF/Other Given to floor  Chief Complaint Hypokalemia [E87.6] Pleural effusion, right [J90] Fever in adult [R50.9] Anemia, unspecified type [D64.9]  Level of Care/Admitting Diagnosis ED Disposition    ED Disposition Condition [place]: [place] [[id number]]  Level of Care: Telemetry [5]  Admit to tele based on following criteria: Other see comments  Comments: anemia, SIRS  Diagnosis: Anemia [619509]  Admitting Physician: Shelbie Proctor [[id number]]  Attending Physician: Shelbie Proctor [[id number]]  PT Class (Do Not Modify): Observation [104]  PT Acc Code (Do Not Modify): Observation [10022]       Medical History Past Medical History:  Diagnosis Date  . Acute on chronic respiratory failure with hypoxia ([place]) 03/07/2018  . Anxiety and depression   . Aortic aneurysm ([place])    see ov note 03/2017   . Arthritis   . Aspiration pneumonia ([place])   . 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 CHF (congestive heart failure) ([name])    diastolic dysfunction  . COPD (chronic obstructive pulmonary disease) ([place]) 10/23/2017  . Depression   . Family  history of breast cancer   . Family history of prostate cancer   . Family history of uterine cancer   . History of diverticulitis   . HLD (hyperlipidemia) 10/23/2017  . Hypercholesterolemia   . Hypertension   . Mitral valve prolapse   . Pelvic abscess in female   . Sleep apnea    cpap machine mask and tubing in [place] per patient.  Patient does not have mask or tubing to bring on DOS of 01/27/2018.   Marland Kitchen Uterine cancer ([place])    had partial hysterectomy at 27    Allergies Allergies  Allergen Reactions  . Oxycodone Other (See Comments)    confusion    IV Location/Drains/Wounds Patient Lines/Drains/Airways Status   Active Line/Drains/Airways    Name:   Placement date:   Placement time:   Site:   Days:   Implanted Port 10/14/17 Right Chest   10/14/17    0831    Chest   259   Closed System Drain 3 Left LLQ Bulb (JP) 19 Fr.   01/31/18    -    LLQ   150   Closed System Drain 1 Left;Anterior Abdomen Bulb (JP) 10.2 Fr.   06/13/18    1325    Abdomen   17   Negative Pressure Wound Therapy Abdomen Mid   02/25/18    -    -   125   Rectal Tube/Pouch   02/22/18    -    -   128  External Urinary Catheter   02/21/18    2215    -   129   Incision (Closed) 01/27/18 Abdomen Other (Comment)   01/27/18    1034     154   Incision (Closed) 01/31/18 Abdomen Other (Comment)   01/31/18    1731     150   Incision - 1 Port Abdomen 1: Left;Lateral   01/27/18    0812     154   Tracheostomy [place] 4 mm Uncuffed   03/02/18    1201    4 mm   120   Pressure Injury 02/03/18 Unstageable - Full thickness tissue loss in which the base of the ulcer is covered by slough (yellow, tan, gray, green or brown) and/or eschar (tan, brown or black) in the wound bed. blister with some surounding necrotic tissue   02/03/18    -     147          Labs/Imaging Results for orders placed or performed during the hospital encounter of 06/30/18 (from the past 48 hour(s))  Influenza panel by PCR (type A & B)     Status: None    Collection Time: 06/30/18  4:41 PM  Result Value Ref Range   Influenza A By PCR NEGATIVE NEGATIVE   Influenza B By PCR NEGATIVE NEGATIVE    Comment: (NOTE) The Xpert Xpress Flu assay is intended as an aid in the diagnosis of  influenza and should not be used as a sole basis for treatment.  This  assay is FDA approved for nasopharyngeal swab specimens only. Nasal  washings and aspirates are unacceptable for Xpert Xpress Flu testing. Performed at [facility], [place] [address] [place], [place] [address]   Comprehensive metabolic panel     Status: Abnormal   Collection Time: 06/30/18  5:04 PM  Result Value Ref Range   Sodium 134 (L) 135 - 145 mmol/L   Potassium 3.1 (L) 3.5 - 5.1 mmol/L   Chloride 103 98 - 111 mmol/L   CO2 21 (L) 22 - 32 mmol/L   Glucose, Bld 121 (H) 70 - 99 mg/dL   BUN 14 8 - 23 mg/dL   Creatinine, Ser 0.61 0.44 - 1.00 mg/dL   Calcium 7.6 (L) 8.9 - 10.3 mg/dL   Total Protein 7.2 6.5 - 8.1 g/dL   Albumin 3.0 (L) 3.5 - 5.0 g/dL   AST 56 (H) 15 - 41 U/L   ALT 34 0 - 44 U/L   Alkaline Phosphatase 77 38 - 126 U/L   Total Bilirubin 1.3 (H) 0.3 - 1.2 mg/dL   GFR calc non Af Amer >60 >60 mL/min   GFR calc Af Amer >60 >60 mL/min   Anion gap 10 5 - 15    Comment: Performed at [facility], [place] [address], [place], [place] [address]  Lactic acid, plasma     Status: None   Collection Time: 06/30/18  5:04 PM  Result Value Ref Range   Lactic Acid, Venous 0.9 0.5 - 1.9 mmol/L    Comment: Performed at [facility], [place] [address] [place], [place] [address]  CBC with Differential     Status: Abnormal   Collection Time: 06/30/18  5:04 PM  Result Value Ref Range   WBC 12.7 (H) 4.0 - 10.5 K/uL   RBC 3.75 (L) 3.87 - 5.11 MIL/uL   Hemoglobin 7.7 (L) 12.0 - 15.0 g/dL    Comment: Reticulocyte Hemoglobin testing may be clinically  indicated, consider ordering this additional test [id number]    HCT 24.9 (L) 36.0 - 46.0 %    MCV 66.4 (L) 80.0 - 100.0 fL   MCH 20.5 (L) 26.0 - 34.0 pg   MCHC 30.9 30.0 - 36.0 g/dL   RDW 15.6 (H) 11.5 - 15.5 %   Platelets 211 150 - 400 K/uL   nRBC 0.0 0.0 - 0.2 %   Neutrophils Relative % 83 %   Neutro Abs 10.7 (H) 1.7 - 7.7 K/uL   Lymphocytes Relative 9 %   Lymphs Abs 1.1 0.7 - 4.0 K/uL   Monocytes Relative 7 %   Monocytes Absolute 0.8 0.1 - 1.0 K/uL   Eosinophils Relative 0 %   Eosinophils Absolute 0.0 0.0 - 0.5 K/uL   Basophils Relative 0 %   Basophils Absolute 0.0 0.0 - 0.1 K/uL   Immature Granulocytes 1 %   Abs Immature Granulocytes 0.10 (H) 0.00 - 0.07 K/uL    Comment: Performed at [facility], [place] [address], [place], [place] [address]  Protime-INR     Status: Abnormal   Collection Time: 06/30/18  5:04 PM  Result Value Ref Range   Prothrombin Time 18.4 (H) 11.4 - 15.2 seconds   INR 1.54     Comment: Performed at [facility], [place] [address] [place], [place] [address]  Magnesium     Status: Abnormal   Collection Time: 06/30/18  5:05 PM  Result Value Ref Range   Magnesium 2.5 (H) 1.7 - 2.4 mg/dL    Comment: Performed at [facility], [place] [address] [place], [place] [address]  Troponin I - ONCE - STAT     Status: Abnormal   Collection Time: 06/30/18  5:05 PM  Result Value Ref Range   Troponin I 0.05 (HH) <0.03 ng/mL    Comment: CRITICAL RESULT CALLED TO, READ BACK BY AND VERIFIED WITH: M.BRILL AT 1831 ON 06/30/18 BY N.THOMPSON Performed at [facility], [place] [address], [place], [place] [address]   Brain natriuretic peptide     Status: Abnormal   Collection Time: 06/30/18  5:05 PM  Result Value Ref Range   B Natriuretic Peptide 126.9 (H) 0.0 - 100.0 pg/mL    Comment: Performed at [facility], [place] [address] [place],  [address]  Occult blood, poc device     Status: None   Collection Time: 06/30/18  8:20 PM  Result Value Ref Range   Fecal Occult Bld NEGATIVE  NEGATIVE   Dg Chest 2 View  Result Date: 06/30/2018 CLINICAL DATA:  Fever.  History of uterine cancer. EXAM: CHEST - 2 VIEW COMPARISON:  March 12, 2018 FINDINGS: The right Port-A-Cath is stable. No pneumothorax. Possible mild pulmonary venous congestion. No overt edema or focal infiltrate. The cardiomediastinal silhouette is unremarkable. No pneumothorax. Probable tiny pleural effusions. IMPRESSION: Tiny pleural effusions and possible mild pulmonary venous congestion. No overt edema or focal infiltrate. Electronically Signed   By: Dorise Bullion III M.D   On: 06/30/2018 15:58   Ct Head Wo Contrast  Result Date: 06/30/2018 CLINICAL DATA:  73 year old female with headache and confusion following fall and head injury 1 week ago. EXAM: CT HEAD WITHOUT CONTRAST TECHNIQUE: Contiguous axial images were obtained from the base of the skull through the vertex without intravenous contrast. COMPARISON:  03/03/2018 and prior CTs FINDINGS: Brain: No evidence of acute infarction, hemorrhage, hydrocephalus, extra-axial collection or mass lesion/mass effect. Atrophy, chronic small-vessel white matter ischemic changes  and LEFT aneurysm clip again noted. Vascular: Carotid atherosclerotic calcifications again noted. Skull: No acute abnormality. LEFT temporoparietal craniotomy/craniectomy noted. Sinuses/Orbits: No acute abnormality Other: None IMPRESSION: 1. No evidence of acute intracranial abnormality 2. Atrophy and chronic small-vessel white matter ischemic changes. LEFT-sided aneurysm clip again noted. Electronically Signed   By: Margarette Canada M.D.   On: 06/30/2018 19:25   Ct Abdomen Pelvis W Contrast  Result Date: 06/30/2018 CLINICAL DATA:  73 year old female with abdominal and pelvic pain, fever and nausea. Recent LEFT pelvic abscess and percutaneous drain. EXAM: CT ABDOMEN AND PELVIS WITH CONTRAST TECHNIQUE: Multidetector CT imaging of the abdomen and pelvis was performed using the standard protocol following bolus  administration of intravenous contrast. CONTRAST:  65mL ISOVUE-300 IOPAMIDOL (ISOVUE-300) INJECTION 61% COMPARISON:  06/13/2018 percutaneous drain replacement, 05/10/2018 CT and prior studies FINDINGS: Lower chest: Mild cardiomegaly and new small RIGHT pleural effusion noted. Hepatobiliary: The liver and gallbladder are unremarkable. No biliary dilatation. Pancreas: Unremarkable Spleen: Unremarkable Adrenals/Urinary Tract: The kidneys, adrenal glands and bladder are unremarkable except for a LEFT renal cyst. Stomach/Bowel: There is no evidence of bowel obstruction or definite bowel wall thickening. Colonic diverticulosis noted without definite evidence of diverticulitis. 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 Aortic atherosclerotic calcifications are again noted. Reproductive: Status post hysterectomy. No adnexal masses. Other: Retroperitoneal and mesenteric edema identified. No evidence of ascites or pneumoperitoneum. A percutaneous drainage catheter within the LEFT pelvis is unchanged without residual abscess. Musculoskeletal: No acute or suspicious bony abnormalities identified. IMPRESSION: 1. New small RIGHT pleural effusion and nonspecific retroperitoneal/mesenteric edema. 2. LEFT pelvic drainage catheter again noted - no evidence of residual abscess. 3. 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 4. Cardiomegaly. 5.  Aortic Atherosclerosis (ICD10-I70.0). Electronically Signed   By: Margarette Canada M.D.   On: 06/30/2018 19:36    Pending Labs Unresulted Labs (From admission, onward)    Start     Ordered   06/30/18 1839  Type and screen  ONCE - STAT,   STAT     06/30/18 1838   06/30/18 1608  Urine culture  ONCE - STAT,   STAT     06/30/18 1608   06/30/18 1524  Lactic acid, plasma  Now then every 2 hours,   STAT     06/30/18 1524   06/30/18 1524  Culture, blood (Routine x 2)  BLOOD CULTURE X 2,   STAT     06/30/18 1524   06/30/18 1524   Urinalysis, Routine w reflex microscopic  ONCE - STAT,   STAT     06/30/18 1524          Vitals/Pain Today's Vitals   06/30/18 1514 06/30/18 1518 06/30/18 2019  BP: (!) 143/127  (!) 135/103  Pulse: (!) 112  (!) 104  Resp: 20  14  Temp: 98.9 F (37.2 C)  99.8 F (37.7 C)  TempSrc: Oral  Oral  SpO2: 100% 100% 100%  Weight: 46.4 kg    Height: 5\' 3"  (1.6 m)      Isolation Precautions Droplet precaution  Medications Medications  metroNIDAZOLE (FLAGYL) IVPB 500 mg (500 mg Intravenous New Bag/Given 06/30/18 1824)  0.9 %  sodium chloride infusion (has no administration in time range)  iopamidol (ISOVUE-300) 61 % injection (has no administration in time range)  sodium chloride (PF) 0.9 % injection (has no administration in time range)  fentaNYL (SUBLIMAZE) injection 12.5 mcg (12.5 mcg Intravenous Given 06/30/18 1805)  potassium chloride  10 mEq in 100 mL IVPB (has no administration in time range)  sodium chloride 0.9 % bolus 1,000 mL (has no administration in time range)  sodium chloride flush (NS) 0.9 % injection 3 mL (3 mLs Intravenous Given 06/30/18 1952)  ceFEPIme (MAXIPIME) 2 g in sodium chloride 0.9 % 100 mL IVPB (0 g Intravenous Stopped 06/30/18 1759)  vancomycin (VANCOCIN) IVPB 1000 mg/200 mL premix (1,000 mg Intravenous New Bag/Given 06/30/18 1952)  acetaminophen (TYLENOL) tablet 650 mg (650 mg Oral Given 06/30/18 1956)  iopamidol (ISOVUE-300) 61 % injection 100 mL (80 mLs Intravenous Contrast Given 06/30/18 1852)    Mobility non-ambulatory

## 2018-06-30 NOTE — Progress Notes (Signed)
These preliminary result these preliminary results were noted.  Awaiting final report.

## 2018-06-30 NOTE — Progress Notes (Signed)
Pt presents today with son who she is living with currently instead of her husband in [place].  Pt reports recent fall about 1 wk ago that caused L sided rib/back pain and a possible head injury.  Denies LOC at the time.  Pt currently Ox4, however son reports that pt is intermittently confused and "is in a general state of decline" over the last 1-2 wks.  No unilat weakness or facial droop or slurred speech at this time.  Pt appears somewhat somnolent during assessment and uses walker which is not her normal ambulation.  Son reports anorexia, general weakness & fatigue, and wt loss.   Nurse Dodge spoke with pt's son and patient about home infusions.  As of recently the pt no longer has advanced home care and no longer receives home IV infusions of fluids/magnesium/potassium through her port.  See NN Santiago Glad phone call in chart.  Pt will not have port accessed today for home infusion use.  Pt's son and patient verbalized understanding of the discontinuation at this time.  NN Santiago Glad will find out how to return the remainder of the IV medication the patient still has at home.   Pt taken to restroom by NT's after finding pt soiled herself but was not aware.  Took pt's temperature right before taking her to restroom, temp 101.  VSS up until this point and no complaints from pt of pain, feeling warm, cough, etc.  Pt changed and urine sample taken in restroom, brought back for VS, re-evaluation by PA Lucianne Lei, and to draw blood cultures.  Pt oriented x4 but withdraws from contact and appears to have trouble following directions, seems more somnolent and even less alert than before.  No unilateral weakness, facial droop, slurred speech, or change in pupils noted.  Hot to touch, shivering.  Received 1.5 L fluids total with 4 g Magnesium. RN only able to draw one set of Blood cultures from [facility], could not draw blood from other peripheral stick attempts.  PA Lucianne Lei aware.  Urine culture/ anlaysis drawn.  NO  abx started before pt left [facility].  Pt transported with belongings to ED via [place].  Port remains accessed and saline locked, dressing reinforced.  Phone report given to [place] and pt taken to triage area Room #4, in person report given to triage RN as well.  Son who came with pt contacted by RN Seth Bake and told pt was being taken to ED.

## 2018-07-01 ENCOUNTER — Encounter (HOSPITAL_COMMUNITY): Payer: Self-pay | Admitting: Internal Medicine

## 2018-07-01 ENCOUNTER — Observation Stay (HOSPITAL_BASED_OUTPATIENT_CLINIC_OR_DEPARTMENT_OTHER): Payer: Medicare PPO

## 2018-07-01 ENCOUNTER — Telehealth: Payer: Self-pay | Admitting: Oncology

## 2018-07-01 ENCOUNTER — Inpatient Hospital Stay: Payer: Medicare PPO

## 2018-07-01 DIAGNOSIS — T451X5A Adverse effect of antineoplastic and immunosuppressive drugs, initial encounter: Secondary | ICD-10-CM | POA: Diagnosis present

## 2018-07-01 DIAGNOSIS — D6481 Anemia due to antineoplastic chemotherapy: Secondary | ICD-10-CM

## 2018-07-01 DIAGNOSIS — Z823 Family history of stroke: Secondary | ICD-10-CM | POA: Diagnosis not present

## 2018-07-01 DIAGNOSIS — Z9221 Personal history of antineoplastic chemotherapy: Secondary | ICD-10-CM

## 2018-07-01 DIAGNOSIS — D569 Thalassemia, unspecified: Secondary | ICD-10-CM | POA: Diagnosis present

## 2018-07-01 DIAGNOSIS — R6510 Systemic inflammatory response syndrome (SIRS) of non-infectious origin without acute organ dysfunction: Secondary | ICD-10-CM | POA: Diagnosis not present

## 2018-07-01 DIAGNOSIS — B9561 Methicillin susceptible Staphylococcus aureus infection as the cause of diseases classified elsewhere: Secondary | ICD-10-CM | POA: Diagnosis not present

## 2018-07-01 DIAGNOSIS — R7881 Bacteremia: Secondary | ICD-10-CM

## 2018-07-01 DIAGNOSIS — R509 Fever, unspecified: Secondary | ICD-10-CM | POA: Diagnosis not present

## 2018-07-01 DIAGNOSIS — Z87891 Personal history of nicotine dependence: Secondary | ICD-10-CM | POA: Diagnosis not present

## 2018-07-01 DIAGNOSIS — Z681 Body mass index (BMI) 19 or less, adult: Secondary | ICD-10-CM | POA: Diagnosis not present

## 2018-07-01 DIAGNOSIS — Z8542 Personal history of malignant neoplasm of other parts of uterus: Secondary | ICD-10-CM | POA: Diagnosis not present

## 2018-07-01 DIAGNOSIS — I361 Nonrheumatic tricuspid (valve) insufficiency: Secondary | ICD-10-CM

## 2018-07-01 DIAGNOSIS — I509 Heart failure, unspecified: Secondary | ICD-10-CM | POA: Diagnosis present

## 2018-07-01 DIAGNOSIS — E785 Hyperlipidemia, unspecified: Secondary | ICD-10-CM | POA: Diagnosis present

## 2018-07-01 DIAGNOSIS — T80211A Bloodstream infection due to central venous catheter, initial encounter: Secondary | ICD-10-CM | POA: Diagnosis present

## 2018-07-01 DIAGNOSIS — J449 Chronic obstructive pulmonary disease, unspecified: Secondary | ICD-10-CM | POA: Diagnosis present

## 2018-07-01 DIAGNOSIS — Z8543 Personal history of malignant neoplasm of ovary: Secondary | ICD-10-CM

## 2018-07-01 DIAGNOSIS — E46 Unspecified protein-calorie malnutrition: Secondary | ICD-10-CM

## 2018-07-01 DIAGNOSIS — C786 Secondary malignant neoplasm of retroperitoneum and peritoneum: Secondary | ICD-10-CM

## 2018-07-01 DIAGNOSIS — A4101 Sepsis due to Methicillin susceptible Staphylococcus aureus: Secondary | ICD-10-CM

## 2018-07-01 DIAGNOSIS — Z90710 Acquired absence of both cervix and uterus: Secondary | ICD-10-CM

## 2018-07-01 DIAGNOSIS — E876 Hypokalemia: Secondary | ICD-10-CM

## 2018-07-01 DIAGNOSIS — K632 Fistula of intestine: Secondary | ICD-10-CM | POA: Diagnosis present

## 2018-07-01 DIAGNOSIS — I110 Hypertensive heart disease with heart failure: Secondary | ICD-10-CM | POA: Diagnosis present

## 2018-07-01 DIAGNOSIS — Z95828 Presence of other vascular implants and grafts: Secondary | ICD-10-CM

## 2018-07-01 DIAGNOSIS — D649 Anemia, unspecified: Secondary | ICD-10-CM

## 2018-07-01 DIAGNOSIS — E7800 Pure hypercholesterolemia, unspecified: Secondary | ICD-10-CM | POA: Diagnosis present

## 2018-07-01 DIAGNOSIS — Z8249 Family history of ischemic heart disease and other diseases of the circulatory system: Secondary | ICD-10-CM | POA: Diagnosis not present

## 2018-07-01 DIAGNOSIS — R531 Weakness: Secondary | ICD-10-CM | POA: Diagnosis present

## 2018-07-01 DIAGNOSIS — E43 Unspecified severe protein-calorie malnutrition: Secondary | ICD-10-CM | POA: Diagnosis present

## 2018-07-01 DIAGNOSIS — Z79899 Other long term (current) drug therapy: Secondary | ICD-10-CM | POA: Diagnosis not present

## 2018-07-01 DIAGNOSIS — Y848 Other medical procedures as the cause of abnormal reaction of the patient, or of later complication, without mention of misadventure at the time of the procedure: Secondary | ICD-10-CM | POA: Diagnosis present

## 2018-07-01 DIAGNOSIS — F17211 Nicotine dependence, cigarettes, in remission: Secondary | ICD-10-CM

## 2018-07-01 DIAGNOSIS — Z978 Presence of other specified devices: Secondary | ICD-10-CM

## 2018-07-01 DIAGNOSIS — Z885 Allergy status to narcotic agent status: Secondary | ICD-10-CM

## 2018-07-01 DIAGNOSIS — Z90711 Acquired absence of uterus with remaining cervical stump: Secondary | ICD-10-CM | POA: Diagnosis not present

## 2018-07-01 LAB — BLOOD CULTURE ID PANEL (REFLEXED)
Acinetobacter baumannii: NOT DETECTED
CANDIDA KRUSEI: NOT DETECTED
Candida albicans: NOT DETECTED
Candida glabrata: NOT DETECTED
Candida parapsilosis: NOT DETECTED
Candida tropicalis: NOT DETECTED
ESCHERICHIA COLI: NOT DETECTED
Enterobacter cloacae complex: NOT DETECTED
Enterobacteriaceae species: NOT DETECTED
Enterococcus species: NOT DETECTED
Haemophilus influenzae: NOT DETECTED
Klebsiella oxytoca: NOT DETECTED
Klebsiella pneumoniae: NOT DETECTED
LISTERIA MONOCYTOGENES: NOT DETECTED
Methicillin resistance: NOT DETECTED
Neisseria meningitidis: NOT DETECTED
Proteus species: NOT DETECTED
Pseudomonas aeruginosa: NOT DETECTED
STAPHYLOCOCCUS SPECIES: DETECTED — AB
Serratia marcescens: NOT DETECTED
Staphylococcus aureus (BCID): DETECTED — AB
Streptococcus agalactiae: NOT DETECTED
Streptococcus pneumoniae: NOT DETECTED
Streptococcus pyogenes: NOT DETECTED
Streptococcus species: NOT DETECTED

## 2018-07-01 LAB — ECHOCARDIOGRAM COMPLETE
Height: 63 in
Weight: 1637 oz

## 2018-07-01 LAB — URINALYSIS, ROUTINE W REFLEX MICROSCOPIC
Bilirubin Urine: NEGATIVE
GLUCOSE, UA: NEGATIVE mg/dL
HGB URINE DIPSTICK: NEGATIVE
Ketones, ur: NEGATIVE mg/dL
Nitrite: NEGATIVE
Protein, ur: NEGATIVE mg/dL
Specific Gravity, Urine: 1.03 (ref 1.005–1.030)
pH: 5 (ref 5.0–8.0)

## 2018-07-01 LAB — URINE CULTURE

## 2018-07-01 LAB — CBC
HCT: 29.5 % — ABNORMAL LOW (ref 36.0–46.0)
Hemoglobin: 9.2 g/dL — ABNORMAL LOW (ref 12.0–15.0)
MCH: 21.9 pg — ABNORMAL LOW (ref 26.0–34.0)
MCHC: 31.2 g/dL (ref 30.0–36.0)
MCV: 70.2 fL — ABNORMAL LOW (ref 80.0–100.0)
Platelets: 175 10*3/uL (ref 150–400)
RBC: 4.2 MIL/uL (ref 3.87–5.11)
RDW: 19.6 % — ABNORMAL HIGH (ref 11.5–15.5)
WBC: 9.7 10*3/uL (ref 4.0–10.5)
nRBC: 0 % (ref 0.0–0.2)

## 2018-07-01 LAB — BASIC METABOLIC PANEL
Anion gap: 7 (ref 5–15)
BUN: 13 mg/dL (ref 8–23)
CO2: 22 mmol/L (ref 22–32)
Calcium: 7.8 mg/dL — ABNORMAL LOW (ref 8.9–10.3)
Chloride: 108 mmol/L (ref 98–111)
Creatinine, Ser: 0.69 mg/dL (ref 0.44–1.00)
GFR calc Af Amer: >60 mL/min (ref >60)
GFR calc non Af Amer: >60 mL/min (ref >60)
Glucose, Bld: 113 mg/dL — ABNORMAL HIGH (ref 70–99)
Potassium: 3.3 mmol/L — ABNORMAL LOW (ref 3.5–5.1)
SODIUM: 137 mmol/L (ref 135–145)

## 2018-07-01 LAB — TROPONIN I: Troponin I: 0.04 ng/mL (ref <0.03)

## 2018-07-01 LAB — PREPARE RBC (CROSSMATCH)

## 2018-07-01 MED ORDER — ENSURE ENLIVE PO LIQD
237.0000 mL | Freq: Two times a day (BID) | ORAL | Status: DC
  Administered 2018-07-01 – 2018-07-04 (×4): 237 mL via ORAL

## 2018-07-01 MED ORDER — POTASSIUM CHLORIDE CRYS ER 20 MEQ PO TBCR
20.0000 meq | EXTENDED_RELEASE_TABLET | Freq: Once | ORAL | Status: AC
  Administered 2018-07-01: 20 meq via ORAL
  Filled 2018-07-01: qty 1

## 2018-07-01 MED ORDER — CEFAZOLIN SODIUM-DEXTROSE 2-4 GM/100ML-% IV SOLN
2.0000 g | Freq: Three times a day (TID) | INTRAVENOUS | Status: DC
  Administered 2018-07-01 – 2018-07-07 (×19): 2 g via INTRAVENOUS
  Filled 2018-07-01 (×21): qty 100

## 2018-07-01 MED ORDER — IBUPROFEN 200 MG PO TABS
600.0000 mg | ORAL_TABLET | Freq: Once | ORAL | Status: AC
  Administered 2018-07-01: 600 mg via ORAL
  Filled 2018-07-01: qty 3

## 2018-07-01 NOTE — Care Management Note (Signed)
Case Management Note  Patient Details  Name: Jo Parker MRN: [id number] Date of Birth: June 16, 1945  Subjective/Objective: Fever. Hx: Peritoneal Adenocarcinoma. L pelvic abscess-drain,fistula. From home-lives in [place] but currently staying with Son in [place]. PT cons-await recc.                   Action/Plan:d/c plan to be determined.   Expected Discharge Date:  (unknown)               Expected Discharge Plan:     In-House Referral:  Clinical Social Work  Discharge planning Services  CM Consult  Post Acute Care Choice:    Choice offered to:     DME Arranged:    DME Agency:     HH Arranged:    HH Agency:     Status of Service:  In process, will continue to follow  If discussed at Long Length of Stay Meetings, dates discussed:    Additional Comments:  Dessa Phi, RN 07/01/2018, 12:09 PM

## 2018-07-01 NOTE — Progress Notes (Signed)
PHARMACY - PHYSICIAN COMMUNICATION CRITICAL VALUE ALERT - BLOOD CULTURE IDENTIFICATION (BCID)  Jo Parker is an 73 y.o. female who presented [facility] on 06/30/2018 with a chief complaint of AMS  Assessment:  Fever, possible sepsis (include suspected source if known) 1 of 5 bottles  Name of physician (or Provider) ContactedSherlon Handing, NP  Current antibiotics: Cefepime, Vancomycin and Flagyl  Changes to prescribed antibiotics recommended:  Per new protocol will narrow abx to Ancef 2 Gm IV q8h  Results for orders placed or performed in visit on 06/30/18  Blood Culture ID Panel (Reflexed) (Collected: 06/30/2018  2:39 PM)  Result Value Ref Range   Enterococcus species NOT DETECTED NOT DETECTED   Listeria monocytogenes NOT DETECTED NOT DETECTED   Staphylococcus species DETECTED (A) NOT DETECTED   Staphylococcus aureus (BCID) DETECTED (A) NOT DETECTED   Methicillin resistance NOT DETECTED NOT DETECTED   Streptococcus species NOT DETECTED NOT DETECTED   Streptococcus agalactiae NOT DETECTED NOT DETECTED   Streptococcus pneumoniae NOT DETECTED NOT DETECTED   Streptococcus pyogenes NOT DETECTED NOT DETECTED   Acinetobacter baumannii NOT DETECTED NOT DETECTED   Enterobacteriaceae species NOT DETECTED NOT DETECTED   Enterobacter cloacae complex NOT DETECTED NOT DETECTED   Escherichia coli NOT DETECTED NOT DETECTED   Klebsiella oxytoca NOT DETECTED NOT DETECTED   Klebsiella pneumoniae NOT DETECTED NOT DETECTED   Proteus species NOT DETECTED NOT DETECTED   Serratia marcescens NOT DETECTED NOT DETECTED   Haemophilus influenzae NOT DETECTED NOT DETECTED   Neisseria meningitidis NOT DETECTED NOT DETECTED   Pseudomonas aeruginosa NOT DETECTED NOT DETECTED   Candida albicans NOT DETECTED NOT DETECTED   Candida glabrata NOT DETECTED NOT DETECTED   Candida krusei NOT DETECTED NOT DETECTED   Candida parapsilosis NOT DETECTED NOT DETECTED   Candida tropicalis NOT DETECTED NOT DETECTED     Dorrene German 07/01/2018  6:21 AM

## 2018-07-01 NOTE — Progress Notes (Signed)
These preliminary result these preliminary results were noted.  Awaiting final report.

## 2018-07-01 NOTE — Evaluation (Signed)
Physical Therapy Evaluation Patient Details Name: Jo Parker MRN: [id number] DOB: [birth year] Today's Date: 07/01/2018   History of Present Illness  73 yo female admitted with fever, weakness. Dx SIRS. Hx of peritoneal ca, Ovarian ca, malnutrittion, pelvic abscess s/p debulking sg/drain placement 03/2018, total hysterectomy 01/2018, VDRF, CHF, OA, COPD    Clinical Impression  On eval, pt required Min assist for mobility. She was only able to take a few steps in the room with a RW. Assisted pt into recliner. After assisting pt into recliner she briefly slouched over to her R side and became less responsive. Called RN [facility]) into room to assess pt. Just prior to RN arrival, pt began to respond appropriately again. Vitals WNL. Pt presents with general weakness, decreased activity tolerance, and impaired gait and balance.  Discussed d/c plan-pt is not agreeable to ST SNF. If she returns home, recommend HHPT, 24/7 care. Will continue to follow and progress activity as tolerated.     Follow Up Recommendations SNF(pt is not agreeable to placement at time of eval. If she returns home, recommend HHPT and 24 hour supervision/assist)    Equipment Recommendations       Recommendations for Other Services       Precautions / Restrictions Precautions Precautions: Fall Precaution Comments: drain L side Restrictions Weight Bearing Restrictions: No      Mobility  Bed Mobility Overal bed mobility: Needs Assistance Bed Mobility: Supine to Sit     Supine to sit: Min assist;HOB elevated     General bed mobility comments: Assist for trunk. Increased time. Pt relied on bedrail.   Transfers Overall transfer level: Needs assistance Equipment used: Rolling walker (2 wheeled) Transfers: Sit to/from [facility] Sit to Stand: Min assist Stand pivot transfers: Min assist       General transfer comment: Assist to rise, stabilize, control descent. VCs safety, technique, hand  placement. Stand pivot, bed to recliner, with RW. Pt fatigues easily.   Ambulation/Gait Min Assist Gait Distance (Feet): 3 Feet Assistive device: Rolling walker (2 wheeled) Gait Pattern/deviations: Step-to pattern     General Gait Details: Assist to stabilize pt and maneuver with RW. Pt fatigues very easily.   Stairs            Wheelchair Mobility    Modified Rankin (Stroke Patients Only)       Balance Overall balance assessment: Needs assistance         Standing balance support: Bilateral upper extremity supported Standing balance-Leahy Scale: Poor                               Pertinent Vitals/Pain Pain Assessment: Faces Faces Pain Scale: Hurts little more Pain Location: abdomen/drain site Pain Descriptors / Indicators: Sore Pain Intervention(s): Monitored during session;Limited activity within patient's tolerance;Repositioned    Home Living Family/patient expects to be discharged to:: Unsure Living Arrangements: Children Available Help at Discharge: Family           Home Equipment: Gilford Rile - 2 wheels;Cane - single point Additional Comments: pt/spouse live in [place], pt staying with son while undergoing medical care here in [place]    Prior Function Level of Independence: Needs assistance   Gait / Transfers Assistance Needed: ambulating with a cane  ADL's / Homemaking Assistance Needed: family assisting as needed with household tasks        Hand Dominance        Extremity/Trunk Assessment   Upper Extremity  Assessment Upper Extremity Assessment: Generalized weakness    Lower Extremity Assessment Lower Extremity Assessment: Generalized weakness    Cervical / Trunk Assessment Cervical / Trunk Assessment: Normal  Communication   Communication: Expressive difficulties  Cognition Arousal/Alertness: Awake/alert Behavior During Therapy: WFL for tasks assessed/performed Overall Cognitive Status: Within Functional Limits for  tasks assessed                                        General Comments      Exercises     Assessment/Plan    PT Assessment Patient needs continued PT services  PT Problem List Decreased strength;Decreased balance;Decreased mobility;Decreased activity tolerance;Decreased knowledge of use of DME       PT Treatment Interventions DME instruction;Gait training;Functional mobility training;Therapeutic activities;Balance training;Patient/family education;Therapeutic exercise    PT Goals (Current goals can be found in the Care Plan section)  Acute Rehab PT Goals Patient Stated Goal: home. regain PLOF.  PT Goal Formulation: With patient Time For Goal Achievement: 07/15/18 Potential to Achieve Goals: Fair    Frequency Min 3X/week   Barriers to discharge        Co-evaluation               AM-PAC PT "6 Clicks" Mobility  Outcome Measure Help needed turning from your back to your side while in a flat bed without using bedrails?: A Little Help needed moving from lying on your back to sitting on the side of a flat bed without using bedrails?: A Little Help needed moving to and from a bed to a chair (including a wheelchair)?: A Little Help needed standing up from a chair using your arms (e.g., wheelchair or bedside chair)?: A Little Help needed to walk in hospital room?: A Little Help needed climbing 3-5 steps with a railing? : A Lot 6 Click Score: 17    End of Session Equipment Utilized During Treatment: Gait belt Activity Tolerance: Patient limited by fatigue Patient left: in chair;with chair alarm set;with family/visitor present   PT Visit Diagnosis: Muscle weakness (generalized) (M62.81);Difficulty in walking, not elsewhere classified (R26.2)    Time: 5537-4827 PT Time Calculation (min) (ACUTE ONLY): 29 min   Charges:   PT Evaluation $PT Eval Moderate Complexity: 1 Mod PT Treatments $Gait Training: 8-22 mins         Weston Anna, PT Acute  Rehabilitation Services Pager: [phone] Office: [phone]

## 2018-07-01 NOTE — Progress Notes (Signed)
  Echocardiogram 2D Echocardiogram has been performed.  Jo Parker 07/01/2018, 2:17 PM

## 2018-07-01 NOTE — Progress Notes (Signed)
PROGRESS NOTE  Jo Parker  ZOX:[id number] DOB: 11-08-1945 DOA: 06/30/2018 PCP: [name], MD   Brief Narrative: Jo Parker is a 73 y.o. female with medical history significant of peritoneal carcinomatosis, ovarian cancer, malnutrition, presents with weakness.  Patient has been living back at home in [place] for the past few weeks.  When she was visited by her son he noted she was very weak.  Patient admits to a fall about a week ago where she hit her head.  At the oncology office patient had a fever and seemed a little altered.  She had a low mag and was given some IV magnesium and IV fluids and referred over to the ED.  She really did not complain of very much and she is quite stoic.  Patient's last chemo was August per son.  He states she was told she was deemed cancer free.  She is anorexic and eats very little.  She denies any obvious bleeding.  Patient's cancer course is complicated.  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  Patient is status post debulking and pelvic drain placement October 2019.  She is status post total abdominal hysterectomy with adjuvant chemo in August 2019.  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  She was found to have worsening anemia with a hemoglobin of 7.7.  Her baseline appears to be around 10.  She did have a temperature.  She had a mildly elevated troponin but denied any chest pain.  Patient had unremarkable lactic acid.  UA done at the outpatient office showed small leukocytes but no bacteria and no nitrites.  Patient flu  was negative.  Her stool was negative for blood per ED doctor notes that she had very little stool to test.  Chest x-ray shows small effusion.  Assessment &  Plan: Principal Problem:   Fever in adult Active Problems:   Primary peritoneal carcinomatosis ([name])   Severe protein-calorie malnutrition ([name])   Hypokalemia   Anemia   Weakness generalized   SIRS (systemic inflammatory response syndrome) ([name])   MSSA bacteremia  Sepsis due to MSSA bacteremia: Per report, had prolonged port access as outpatient so this is likely source. - Narrowed abx to ancef, appreciate ID recommendations.  - Continues to have features of sepsis and limited reserve. Agree with ID that observation in SDU is prudent until source control - Discussed with Dr. Kathlene Cote, plans port removal slated for tomorrow AM.  - Echo without vegetation. Will discuss with cardiology Re: TEE.  - Plan to repeat blood cultures following port removal.   Primary peritoneal carcinomatosis: No evidence of recurrence (unremarkable tumor marker), though functional decline is evident. Not undergoing active therapy.  - Appreciate pt's oncologist following the patient. Discussed with Dr. Alvy Bimler in detail this morning who also spoke with patient and family at length.   Generalized weakness, fall at home: No traumatic fractures, etc. on exam.  - Continue PT/OT efforts. Declines SNF placement at this time. Will need 24hr care with son.   At least moderate protein calorie malnutrition: Due to chronic illness.  - Dietitian consulted, recommending protein supplementation. BMI 18.   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 - IR evaluated drain, placed to gravity drainage, noted ongoing fistula on imaging, advised against removing drain. F/u in 3-4 weeks for injection for evaluation. - Was due to flex sigmoidoscopy with Dr. Marcello Moores as outpatient 1/23. Will defer this for the time being.  Hypomagnesemia: Acute on chronic - Replace and recheck daily  Hypokalemia:   - Replace and recheck daily  Multifactorial anemia: Due to thalassemia, malignancy, chemotherapy.  - s/p 1u PRBCs. Continue to monitor and transfuse prn.  DVT prophylaxis: SCDs Code Status: Full Family Communication: None at bedside on my evaluation Disposition Plan: Transfer to SDU for close monitoring pending source control of MSSA bacteremia. It is reasonable to convert to inpatient status due to severity of illness (both acute and chronic) and need for ongoing inpatient evaluation and management, IV abx, etc.   Consultants:   Infectious disease  Oncology  Interventional radiology  Procedures:   None  Antimicrobials:  Vancomycin, flagyl, cefepime 1/23  Ancef 1/24 >>    Subjective: Feels very weak, tearful about severity of weakness and illness/decline in general. Febrile ongoing today. +Chills. Denies dysuria.   Objective: Vitals:   07/01/18 0802 07/01/18 0812 07/01/18 1254 07/01/18 1523  BP: 117/65  102/76 (!) 147/67  Pulse: 84  79 76  Resp:   18   Temp:  97.6 F (36.4 C) (!) 97.5 F (36.4 C)   TempSrc:  Oral Oral   SpO2: 100%  100%   Weight:      Height:        Intake/Output Summary (Last 24 hours) at 07/01/2018 1829 Last data filed at 07/01/2018 1721 Gross per 24 hour  Intake 1445 ml  Output 302 ml  Net 1143 ml   Filed Weights   06/30/18 1514  Weight: 46.4 kg    Gen: Frail elderly female in no distress Pulm: Non-labored breathing. Clear to auscultation bilaterally.  CV: Regular rate and rhythm. No murmur, rub, or gallop. No JVD, no pedal edema. GI: Abdomen soft, diffusely tender, non-distended, with normoactive bowel sounds. No organomegaly or masses felt. Ext: Warm, no deformities Skin: Port site in R upper chest without erythema, discharge. Minimal tenderness. LLQ abdominal drain in place c/d/i draining brown cloudly liquid into JP. Neuro: Alert and oriented. No focal neurological deficits. Psych: Judgement and insight appear normal. Mood &  affect appropriate.   Data Reviewed: I have personally reviewed following labs and imaging studies  CBC: Recent Labs  Lab 06/30/18 0924 06/30/18 1704 07/01/18 0852  WBC 9.2 12.7* 9.7  NEUTROABS 6.7 10.7*  --   HGB 11.5* 7.7* 9.2*  HCT 36.9 24.9* 29.5*  MCV 64.1* 66.4* 70.2*  PLT 245 211 376   Basic Metabolic Panel: Recent Labs  Lab 06/30/18 0924 06/30/18 1704 06/30/18 1705 07/01/18 0852  NA 137 134*  --  137  K 3.8 3.1*  --  3.3*  CL 98 103  --  108  CO2 24 21*  --  22  GLUCOSE 122* 121*  --  113*  BUN 15 14  --  13  CREATININE 0.91 0.61  --  0.69  CALCIUM 9.8 7.6*  --  7.8*  MG 1.6*  --  2.5*  --    GFR: Estimated Creatinine Clearance: 45.9 mL/min (by C-G formula based on SCr of 0.69 mg/dL). Liver Function Tests: Recent Labs  Lab 06/30/18 0924 06/30/18 1704  AST 54* 56*  ALT 36 34  ALKPHOS 111 77  BILITOT 1.1 1.3*  PROT 9.2* 7.2  ALBUMIN 3.4* 3.0*   No results for input(s): LIPASE, AMYLASE in the last 168 hours. No results for input(s): AMMONIA in the last 168 hours. Coagulation Profile: Recent Labs  Lab 06/30/18 1704  INR 1.54   Cardiac Enzymes: Recent Labs  Lab 06/30/18 1705 06/30/18 2210 07/01/18 0852  TROPONINI 0.05* 0.05* 0.04*   BNP (last 3 results) No results for input(s): PROBNP in the last 8760 hours. HbA1C: No results for input(s): HGBA1C in the last 72 hours. CBG: No results for input(s): GLUCAP in the last 168 hours. Lipid Profile: No results for input(s): CHOL, HDL, LDLCALC, TRIG, CHOLHDL, LDLDIRECT in the last 72 hours. Thyroid Function Tests: No results for input(s): TSH, T4TOTAL, FREET4, T3FREE, THYROIDAB in the last 72 hours. Anemia Panel: Recent Labs    06/30/18 2210  VITAMINB12 (NOTE)  FOLATE 22.0  FERRITIN 705*  TIBC 144*  IRON 11*   Urine analysis:    Component Value Date/Time   COLORURINE YELLOW 07/01/2018 1457   APPEARANCEUR CLEAR 07/01/2018 1457   LABSPEC 1.030 07/01/2018 1457   PHURINE 5.0 07/01/2018  [place] 07/01/2018 1457   HGBUR NEGATIVE 07/01/2018 [place] 07/01/2018 [place] 07/01/2018 1457   PROTEINUR NEGATIVE 07/01/2018 1457   NITRITE NEGATIVE 07/01/2018 1457   LEUKOCYTESUR TRACE (A) 07/01/2018 1457   Recent Results (from the past 240 hour(s))  Urine Culture     Status: Abnormal   Collection Time: 06/30/18  2:13 PM  Result Value Ref Range Status   Specimen Description   Final    URINE, CLEAN CATCH Performed at [facility], [place] [address] [place], [place] [address]    Special Requests   Final    NONE Performed at [facility], [place] [address] [place], [place] [address]    Culture MULTIPLE SPECIES PRESENT, SUGGEST RECOLLECTION (A)  Final   Report Status 07/01/2018 FINAL  Final  Culture, Blood     Status: None (Preliminary result)   Collection Time: 06/30/18  2:39 PM  Result Value Ref Range Status   Specimen Description   Final    BLOOD LEFT ANTECUBITAL Performed at [facility], [place] [address] [place], [place] [address]    Special Requests   Final    BOTTLES DRAWN AEROBIC ONLY Blood Culture results may not be optimal due to an inadequate volume of blood received in culture bottles   Culture  Setup Time   Final    GRAM POSITIVE COCCI AEROBIC BOTTLE ONLY Organism ID to follow CRITICAL RESULT CALLED TO, READ BACK BY AND VERIFIED WITH: B GREEN PHARMD [id number] 07/01/2018 A BROWNING Performed at [facility], [place] [address] [place], [place] [address]    Culture PENDING  Incomplete   Report Status PENDING  Incomplete  Blood Culture ID Panel (Reflexed)     Status: Abnormal   Collection Time: 06/30/18  2:39 PM  Result Value Ref Range Status   Enterococcus species NOT DETECTED NOT DETECTED Final   Listeria monocytogenes NOT DETECTED NOT DETECTED Final   Staphylococcus species DETECTED (A) NOT DETECTED Final    Comment: CRITICAL RESULT  CALLED TO, READ BACK BY AND VERIFIED WITH: B GREEN PHARMD [id number] 07/01/2018 A BROWNING    Staphylococcus aureus (BCID) DETECTED (A) NOT DETECTED Final    Comment: Methicillin (oxacillin) susceptible Staphylococcus aureus (MSSA). Preferred therapy is anti staphylococcal beta lactam antibiotic (Cefazolin or  Nafcillin), unless clinically contraindicated. CRITICAL RESULT CALLED TO, READ BACK BY AND VERIFIED WITH: B GREEN PHARM 0610 07/01/2018 A BROWNING    Methicillin resistance NOT DETECTED NOT DETECTED Final   Streptococcus species NOT DETECTED NOT DETECTED Final   Streptococcus agalactiae NOT DETECTED NOT DETECTED Final   Streptococcus pneumoniae NOT DETECTED NOT DETECTED Final   Streptococcus pyogenes NOT DETECTED NOT DETECTED Final   Acinetobacter baumannii NOT DETECTED NOT DETECTED Final   Enterobacteriaceae species NOT DETECTED NOT DETECTED Final   Enterobacter cloacae complex NOT DETECTED NOT DETECTED Final   Escherichia coli NOT DETECTED NOT DETECTED Final   Klebsiella oxytoca NOT DETECTED NOT DETECTED Final   Klebsiella pneumoniae NOT DETECTED NOT DETECTED Final   Proteus species NOT DETECTED NOT DETECTED Final   Serratia marcescens NOT DETECTED NOT DETECTED Final   Haemophilus influenzae NOT DETECTED NOT DETECTED Final   Neisseria meningitidis NOT DETECTED NOT DETECTED Final   Pseudomonas aeruginosa NOT DETECTED NOT DETECTED Final   Candida albicans NOT DETECTED NOT DETECTED Final   Candida glabrata NOT DETECTED NOT DETECTED Final   Candida krusei NOT DETECTED NOT DETECTED Final   Candida parapsilosis NOT DETECTED NOT DETECTED Final   Candida tropicalis NOT DETECTED NOT DETECTED Final    Comment: Performed at [facility], [place] [address] [place], [place] [address]  Culture, blood (Routine x 2)     Status: None (Preliminary result)   Collection Time: 06/30/18  5:15 PM  Result Value Ref Range Status   Specimen Description   Final    BLOOD PORT Performed at [place] [address], [place], [place] [address]    Special Requests   Final    BOTTLES DRAWN AEROBIC AND ANAEROBIC Blood Culture results may not be optimal due to an inadequate volume of blood received in culture bottles Performed at [place] [address], [place], [place] [address]    Culture  Setup Time   Final    GRAM POSITIVE COCCI IN CLUSTERS IN BOTH AEROBIC AND ANAEROBIC BOTTLES CRITICAL RESULT CALLED TO, READ BACK BY AND VERIFIED WITH: Shelda Jakes PHARMD, AT Azzie [place] 07/01/18 BY Rush Landmark Performed at [facility], [place] [address] [place], [place] [address]    Culture GRAM POSITIVE COCCI  Final   Report Status PENDING  Incomplete      Radiology Studies: Dg Chest 2 View  Result Date: 06/30/2018 CLINICAL DATA:  Fever.  History of uterine cancer. EXAM: CHEST - 2 VIEW COMPARISON:  March 12, 2018 FINDINGS: The right Port-A-Cath is stable. No pneumothorax. Possible mild pulmonary venous congestion. No overt edema or focal infiltrate. The cardiomediastinal silhouette is unremarkable. No pneumothorax. Probable tiny pleural effusions. IMPRESSION: Tiny pleural effusions and possible mild pulmonary venous congestion. No overt edema or focal infiltrate. Electronically Signed   By: Dorise Bullion III M.D   On: 06/30/2018 15:58   Ct Head Wo Contrast  Result Date: 06/30/2018 CLINICAL DATA:  73 year old female with headache and confusion following fall and head injury 1 week ago. EXAM: CT HEAD WITHOUT CONTRAST TECHNIQUE: Contiguous axial images were obtained from the base of the skull through the vertex without intravenous contrast. COMPARISON:  03/03/2018 and prior CTs FINDINGS: Brain: No evidence of acute infarction, hemorrhage, hydrocephalus, extra-axial collection or mass lesion/mass effect. Atrophy, chronic small-vessel white matter ischemic changes and LEFT aneurysm clip again noted. Vascular: Carotid atherosclerotic calcifications again noted. Skull:  No acute abnormality. LEFT temporoparietal craniotomy/craniectomy noted. Sinuses/Orbits: No acute abnormality  Other: None IMPRESSION: 1. No evidence of acute intracranial abnormality 2. Atrophy and chronic small-vessel white matter ischemic changes. LEFT-sided aneurysm clip again noted. Electronically Signed   By: Margarette Canada M.D.   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 Electronically Signed   By: Margarette Canada M.D.   On: 07/01/2018 10:19   Result Date: 07/01/2018 CLINICAL DATA:  73 year old female with abdominal and pelvic pain, fever and nausea. Recent LEFT pelvic abscess and percutaneous drain. EXAM: CT ABDOMEN AND PELVIS WITH CONTRAST TECHNIQUE: Multidetector CT imaging of the abdomen and pelvis was performed using the standard protocol following bolus administration of intravenous contrast. CONTRAST:  92mL ISOVUE-300 IOPAMIDOL (ISOVUE-300) INJECTION 61% COMPARISON:  06/13/2018 percutaneous drain replacement, 05/10/2018 CT and prior studies FINDINGS: Lower chest: Mild cardiomegaly and new small RIGHT pleural effusion noted. Hepatobiliary: The liver and gallbladder are unremarkable. No biliary dilatation. Pancreas: Unremarkable Spleen: Unremarkable Adrenals/Urinary Tract: The kidneys, adrenal glands and bladder are unremarkable except for a LEFT renal cyst. Stomach/Bowel: There is no evidence of bowel obstruction or definite bowel wall thickening. Colonic diverticulosis noted without definite evidence of diverticulitis. 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 Aortic atherosclerotic calcifications are again noted. Reproductive: Status post hysterectomy. No adnexal masses. Other: Retroperitoneal and mesenteric edema identified. No evidence of ascites or pneumoperitoneum. A  percutaneous drainage catheter within the LEFT pelvis is unchanged without residual abscess. Musculoskeletal: No acute or suspicious bony abnormalities identified. IMPRESSION: 1. New small RIGHT pleural effusion and nonspecific retroperitoneal/mesenteric edema. 2. LEFT pelvic drainage catheter again noted - no evidence of residual abscess. 3. 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 4. Cardiomegaly. 5.  Aortic Atherosclerosis (ICD10-I70.0). Electronically Signed: By: Margarette Canada M.D. On: 06/30/2018 19:36    Scheduled Meds: . feeding supplement (ENSURE ENLIVE)  237 mL Oral BID BM  . magnesium oxide  400 mg Oral Daily  . mirtazapine  30 mg Oral QHS  . multivitamin with minerals  1 tablet Oral Daily   Continuous Infusions: . sodium chloride 100 mL/hr at 07/01/18 1538  .  ceFAZolin (ANCEF) IV 2 g (07/01/18 1721)     LOS: 0 days   Time spent: 35 minutes.  Patrecia Pour, MD [facility] [email or web address] Password [facility] 07/01/2018, 6:29 PM

## 2018-07-01 NOTE — Progress Notes (Signed)
Initial Nutrition Assessment  DOCUMENTATION CODES:   (Will assess for malnutrition at follow-up.)  INTERVENTION:  - Will order Ensure Enlive BID, each supplement provides 350 kcal and 20 grams of protein. - Will complete NFPE at follow-up. - Continue to encourage PO intakes.    NUTRITION DIAGNOSIS:   Increased nutrient needs related to chronic illness, catabolic illness, cancer and cancer related treatments as evidenced by estimated needs.  GOAL:   Patient will meet greater than or equal to 90% of their needs  MONITOR:   PO intake, Supplement acceptance, Weight trends, Labs  REASON FOR ASSESSMENT:   Malnutrition Screening Tool, Consult Assessment of nutrition requirement/status  ASSESSMENT:   73 y.o. female with medical history significant of peritoneal carcinomatosis, ovarian cancer, malnutrition. She presented to the ED with weakness. Patient's last chemo was August, per son. She is anorexic and eats very little.  BMI currently indicates underweight status; will monitor weight trends during hospitalization. Unable to see patient x2 attempts. Per review of flow sheet, she ate 50% of an omelet for breakfast this AM.   Per chart review, current weight is 102 lb and weight on 05/26/18 was 107 lb. This indicates 5 lb weight loss (4.7% body weight) in the past 1 month. Weight on 04/05/18 was 109 lb which indicates 7% wt loss (6.4% body weight) in the past 3 months.  Per H&P: patient dx with SIRS. Dr. Alvy Bimler saw patient this AM.    Medications reviewed; 400 mg Mag-ox/day, 10 mEq IV KCl x1 run 1/23, 20 mEq K-Dur x1 dose 1/24. Labs reviewed; K: 3.3 mmol/l, Ca: 7.8 mg/dl. IVF; NS @ 100 ml/hr.       NUTRITION - FOCUSED PHYSICAL EXAM:  Will attempt at follow-up.  Diet Order:   Diet Order            Diet regular Room service appropriate? Yes; Fluid consistency: Thin  Diet effective now              EDUCATION NEEDS:   No education needs have been identified at this  time  Skin:  Skin Assessment: Reviewed RN Assessment  Last BM:  1/22  Height:   Ht Readings from Last 1 Encounters:  06/30/18 5\' 3"  (1.6 m)    Weight:   Wt Readings from Last 1 Encounters:  06/30/18 46.4 kg    Ideal Body Weight:  52.27 kg  BMI:  Body mass index is 18.12 kg/m.  Estimated Nutritional Needs:   Kcal:  1625-1855 kcal  Protein:  70-80 grams  Fluid:  >/= 1.8 L/day     Jarome Matin, MS, RD, LDN, [facility] Inpatient Clinical Dietitian Pager # [phone] After hours/weekend pager # [phone]

## 2018-07-01 NOTE — Progress Notes (Signed)
Pt's oral temp 100.32F prior to blood admin, oncall provider paged order of motrin 600 received. Admin meds, blood held at this time per provider.

## 2018-07-01 NOTE — Progress Notes (Signed)
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    Fistula tract was widely patent on the drain injection on 05/10/18 and the tract does appear to be getting smaller.    When she was seen by Lennette Bihari in December, he explained to the patient that it may take some time for this fistula tract to completely heal and that she would be at risk to develop a recurrent pelvic collection if we remove the drain too early.  On June 13, 2018, Dr. Laurence Ferrari exchanged the drain. His note reads= 1. 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 2. Successful exchange for a new French drainage catheter which was positioned slightly farther away from the site of fistulization.  In the meantime she had actually moved to [place] but her son brought her back here because her condition started to decline.  She was admitted to [facility] yesterday for possible sepsis.  CT scan was obtained due to the presence of the drain.  Dr. Kathlene Cote reviewed the images and there is a fistula containing air visible between the abscess cavity and the sigmoid colon.   The drain cannot be removed at this time.  Allergies: Oxycodone  Medications: Prior to Admission medications   Medication Sig Start Date End Date Taking? Authorizing Provider  acetaminophen (TYLENOL) 325 MG tablet Take 2 tablets (650 mg total) by mouth every 6 (six) hours as needed for mild pain, fever or headache. 03/04/18  Yes Cross, Melissa D, NP  carvedilol (COREG) 12.5 MG tablet Take 12.5 mg by mouth 2 (two) times daily  with a meal.   Yes [provider]  lidocaine-prilocaine (EMLA) cream Apply 1 application topically as needed. Patient taking differently: Apply 1 application topically as needed (port access).  03/31/18  Yes Gorsuch, Ni, MD  magnesium oxide (MAG-OX) 400 MG tablet Take 400 mg by mouth daily.   Yes [provider]  mirtazapine (REMERON) 30 MG tablet Take 1 tablet (30 mg total) by mouth at bedtime. 05/13/18  Yes [name], MD  Multiple Vitamin (MULTIVITAMIN WITH MINERALS) TABS tablet Take 1 tablet by mouth daily.   Yes [provider]     Vital Signs: BP 117/65   Pulse 84   Temp 97.6 F (36.4 C) (Oral)   Resp 18   Ht 5\' 3"  (1.6 m)   Wt 46.4 kg   SpO2 100%   BMI 18.12 kg/m   Physical Exam Awake and alert NAD Tan purulent liquid in the drain bulb.  Imaging: Dg Chest 2 View  Result Date: 06/30/2018 CLINICAL DATA:  Fever.  History of uterine cancer. EXAM: CHEST - 2 VIEW COMPARISON:  March 12, 2018 FINDINGS: The right Port-A-Cath is stable. No pneumothorax. Possible mild pulmonary venous congestion. No overt edema or focal infiltrate. The cardiomediastinal silhouette is unremarkable. No pneumothorax. Probable tiny pleural effusions. IMPRESSION: Tiny pleural effusions and possible mild pulmonary venous congestion. No overt edema or focal infiltrate. Electronically Signed   By: Dorise Bullion III M.D   On: 06/30/2018 15:58   Ct Head Wo Contrast  Result Date: 06/30/2018 CLINICAL DATA:  73 year old female with headache and confusion  following fall and head injury 1 week ago. EXAM: CT HEAD WITHOUT CONTRAST TECHNIQUE: Contiguous axial images were obtained from the base of the skull through the vertex without intravenous contrast. COMPARISON:  03/03/2018 and prior CTs FINDINGS: Brain: No evidence of acute infarction, hemorrhage, hydrocephalus, extra-axial collection or mass lesion/mass effect. Atrophy, chronic small-vessel white matter ischemic changes and LEFT  aneurysm clip again noted. Vascular: Carotid atherosclerotic calcifications again noted. Skull: No acute abnormality. LEFT temporoparietal craniotomy/craniectomy noted. Sinuses/Orbits: No acute abnormality Other: None IMPRESSION: 1. No evidence of acute intracranial abnormality 2. Atrophy and chronic small-vessel white matter ischemic changes. LEFT-sided aneurysm clip again noted. Electronically Signed   By: Margarette Canada M.D.   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 Electronically Signed   By: Margarette Canada M.D.   On: 07/01/2018 10:19   Result Date: 07/01/2018 CLINICAL DATA:  73 year old female with abdominal and pelvic pain, fever and nausea. Recent LEFT pelvic abscess and percutaneous drain. EXAM: CT ABDOMEN AND PELVIS WITH CONTRAST TECHNIQUE: Multidetector CT imaging of the abdomen and pelvis was performed using the standard protocol following bolus administration of intravenous contrast. CONTRAST:  68mL ISOVUE-300 IOPAMIDOL (ISOVUE-300) INJECTION 61% COMPARISON:  06/13/2018 percutaneous drain replacement, 05/10/2018 CT and prior studies FINDINGS: Lower chest: Mild cardiomegaly and new small RIGHT pleural effusion noted. Hepatobiliary: The liver and gallbladder are unremarkable. No biliary dilatation. Pancreas: Unremarkable Spleen: Unremarkable Adrenals/Urinary Tract: The kidneys, adrenal glands and bladder are unremarkable except for a LEFT renal cyst. Stomach/Bowel: There is no evidence of bowel obstruction or definite bowel wall thickening. Colonic diverticulosis noted without definite evidence of diverticulitis. 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 Aortic atherosclerotic calcifications are again noted. Reproductive: Status post hysterectomy. No adnexal masses. Other:  Retroperitoneal and mesenteric edema identified. No evidence of ascites or pneumoperitoneum. A percutaneous drainage catheter within the LEFT pelvis is unchanged without residual abscess. Musculoskeletal: No acute or suspicious bony abnormalities identified. IMPRESSION: 1. New small RIGHT pleural effusion and nonspecific retroperitoneal/mesenteric edema. 2. LEFT pelvic drainage catheter again noted - no evidence of residual abscess. 3. 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 4. Cardiomegaly. 5.  Aortic Atherosclerosis (ICD10-I70.0). Electronically Signed: By: Margarette Canada M.D. On: 06/30/2018 19:36    Labs:  CBC: Recent Labs    06/15/18 1350 06/30/18 0924 06/30/18 1704 07/01/18 0852  WBC 5.8 9.2 12.7* 9.7  HGB 10.7* 11.5* 7.7* 9.2*  HCT 34.4* 36.9 24.9* 29.5*  PLT 215 245 211 175    COAGS: Recent Labs    10/08/17 0710  01/31/18 0850  02/17/18 0838 03/05/18 0704 04/18/18 0742 06/30/18 1704  INR 1.22   < > 1.36   < > 1.28 1.24 1.24 1.54  APTT 32  --  36  --   --   --   --   --    < > = values in this interval not displayed.    BMP: Recent Labs    06/15/18 1350 06/30/18 0924 06/30/18 1704 07/01/18 0852  NA 142 137 134* 137  K 4.0 3.8 3.1* 3.3*  CL 105 98 103 108  CO2 27 24 21* 22  GLUCOSE 94 122* 121* 113*  BUN 15 15 14 13   CALCIUM 9.6 9.8 7.6* 7.8*  CREATININE 0.82 0.91 0.61 0.69  GFRNONAA >60 >60 >  60 >60  GFRAA >60 >60 >60 >60    LIVER FUNCTION TESTS: Recent Labs    06/10/18 1431 06/15/18 1350 06/30/18 0924 06/30/18 1704  BILITOT 0.3 0.4 1.1 1.3*  AST 18 17 54* 56*  ALT 12 12 36 34  ALKPHOS 94 90 111 77  PROT 8.6* 8.5* 9.2* 7.2  ALBUMIN 3.5 3.6 3.4* 3.0*    Assessment and Plan:  S/P drain placement 04/18/18 then exchange on 06/13/18 away from fistula.  Fistula remains on recent imaging.  Recommend gravity bag and stop flushes. (I have placed gravity bag).  F/U in 3-4 weeks with injection to evaluate fistula.  Electronically  Signed: Murrell Redden, PA-C 07/01/2018, 11:29 AM    I spent a total of 15 Minutes at the the patient's bedside AND on the patient's hospital floor or unit, greater than 50% of which was counseling/coordinating care for evaluate of drain.

## 2018-07-01 NOTE — Progress Notes (Signed)
Pt son states that she does not wear CPAP at home.  RT will hold CPAP per son, RT to monitor and assess as needed.

## 2018-07-01 NOTE — Progress Notes (Signed)
Jo Parker   DOB:10-Mar-1946   UR#:[id number]    Assessment & Plan:   Primary peritoneal carcinomatosis [facility]) I have reviewed her CT imaging which show no signs of definitive cancer recurrence. Previously noted abscess collection had disappeared.  Her recent tumor marker was unremarkable.  Continue supportive care  History of pelvic abscess/fistula with drain placement I have reviewed her CT imaging. I have consulted interventional radiologist to see her, to determine whether sigmoidoscopy is still indicated and whether the drain can be removed while she is hospitalized  MSSA bacteremia/sepsis I am concerned about possible source from her abdomen or from the port.  I recommend echocardiogram for evaluation and to exclude endocarditis.  I have discussed this with hospitalist who will arrange for this to be done.  She will continue broad-spectrum IV antibiotics for now.  Hypomagnesemia/hypokalemia She has chronic hypomagnesemia, likely due to poor oral intake and chronic GI loss She will continue IV magnesium and IV potassium replacement therapy as needed  Poor nutrition, recent weight loss Recommend dietitian to see  Anemia due to antineoplastic chemotherapy and background of thalassemia She has multifactorial anemia. She had received blood transfusion and her hemoglobin is stable.  Recent fall, weakness Recommend PT OT to evaluate while she is in the hospital.  She might need skilled nursing facility placement upon discharge  Goals of care discussion I am very worried about the patient.  The patient wants to live at home in [place] but I am not able to take care of her when she is far away.  Her son, [name], is present throughout the conversation.  We discussed goals of care.  The patient appears to want aggressive supportive care.  She is aware that she might not be able to return home to [place] upon discharge  Discharge planning I have discussed the case extensively with  the patient, her son and primary service.  I will return to check on her next week   [name], MD 07/01/2018  8:20 AM   Subjective:  I met with the patient's son, [name] and the patient. From what I gathered, since June 13, 2018, the patient has returned home to [place] to stay there long-term.  I was not aware of this plan.  I thought the patient is just returning to [place] to stay for few days. She has not been assessed by advanced home care nursing team and has not been receiving IV fluids or IV magnesium. According to her son, the patient has significant decline approximately a week ago. She was seen by her primary care doctor around June 24, 2018.  The needle in her port site has not been changed for some time and that was removed.  She has not been eating well.  She has been feeling weak and had a fall at home several days ago without major injuries.  She denies nausea, bloating or diarrhea. Due to her feeling unwell, her son subsequently went to bring her back to [facility], in anticipation also for scheduled colonoscopy on June 30, 2018.  However, upon seeing how weak she was, he cancelled her sigmoidoscopy yesterday and brought her to the cancer center for evaluation.  She was seen by my physician assistant and was directed to be admitted due to concern for sepsis.  She had documented fever. After aggressive fluid resuscitation, she was noted to be anemic.  She received blood transfusion overnight. This morning, she felt better.  She denies abdominal pain or nausea. The patient  denies any recent signs or symptoms of bleeding such as spontaneous epistaxis, hematuria or hematochezia. Noted blood culture came back positive for MSSA  Objective:  Vitals:   07/01/18 0802 07/01/18 0812  BP: 117/65   Pulse: 84   Resp:    Temp:  97.6 F (36.4 C)  SpO2: 100%      Intake/Output Summary (Last 24 hours) at 07/01/2018 0820 Last data filed at 07/01/2018 0800 Gross per 24 hour   Intake 395 ml  Output -  Net 395 ml    GENERAL:alert, no distress and comfortable.  She looks thin, cachectic and weak.  She has oxygen delivered via nasal cannula  sKIN: skin color is pale, texture, turgor are normal, no rashes or significant lesions EYES: normal, Conjunctiva are pale and non-injected, sclera clear OROPHARYNX:no exudate, no erythema and lips, buccal mucosa, and tongue normal  NECK: supple, thyroid normal size, non-tender, without nodularity LYMPH:  no palpable lymphadenopathy in the cervical, axillary or inguinal LUNGS: clear to auscultation and percussion with normal breathing effort HEART: regular rate & rhythm and no murmurs and no lower extremity edema ABDOMEN:abdomen soft, non-tender and normal bowel sounds. Drain in situ Musculoskeletal:no cyanosis of digits and no clubbing  NEURO: alert & oriented x 3 with fluent speech, no focal motor/sensory deficits   Labs:  Lab Results  Component Value Date   WBC 12.7 (H) 06/30/2018   HGB 7.7 (L) 06/30/2018   HCT 24.9 (L) 06/30/2018   MCV 66.4 (L) 06/30/2018   PLT 211 06/30/2018   NEUTROABS 10.7 (H) 06/30/2018    Lab Results  Component Value Date   NA 134 (L) 06/30/2018   K 3.1 (L) 06/30/2018   CL 103 06/30/2018   CO2 21 (L) 06/30/2018    Studies:  Dg Chest 2 View  Result Date: 06/30/2018 CLINICAL DATA:  Fever.  History of uterine cancer. EXAM: CHEST - 2 VIEW COMPARISON:  March 12, 2018 FINDINGS: The right Port-A-Cath is stable. No pneumothorax. Possible mild pulmonary venous congestion. No overt edema or focal infiltrate. The cardiomediastinal silhouette is unremarkable. No pneumothorax. Probable tiny pleural effusions. IMPRESSION: Tiny pleural effusions and possible mild pulmonary venous congestion. No overt edema or focal infiltrate. Electronically Signed   By: Dorise Bullion III M.D   On: 06/30/2018 15:58   Ct Head Wo Contrast  Result Date: 06/30/2018 CLINICAL DATA:  73 year old female with headache and  confusion following fall and head injury 1 week ago. EXAM: CT HEAD WITHOUT CONTRAST TECHNIQUE: Contiguous axial images were obtained from the base of the skull through the vertex without intravenous contrast. COMPARISON:  03/03/2018 and prior CTs FINDINGS: Brain: No evidence of acute infarction, hemorrhage, hydrocephalus, extra-axial collection or mass lesion/mass effect. Atrophy, chronic small-vessel white matter ischemic changes and LEFT aneurysm clip again noted. Vascular: Carotid atherosclerotic calcifications again noted. Skull: No acute abnormality. LEFT temporoparietal craniotomy/craniectomy noted. Sinuses/Orbits: No acute abnormality Other: None IMPRESSION: 1. No evidence of acute intracranial abnormality 2. Atrophy and chronic small-vessel white matter ischemic changes. LEFT-sided aneurysm clip again noted. Electronically Signed   By: Margarette Canada M.D.   On: 06/30/2018 19:25   Ct Abdomen Pelvis W Contrast  Result Date: 06/30/2018 CLINICAL DATA:  73 year old female with abdominal and pelvic pain, fever and nausea. Recent LEFT pelvic abscess and percutaneous drain. EXAM: CT ABDOMEN AND PELVIS WITH CONTRAST TECHNIQUE: Multidetector CT imaging of the abdomen and pelvis was performed using the standard protocol following bolus administration of intravenous contrast. CONTRAST:  83m ISOVUE-300 IOPAMIDOL (ISOVUE-300) INJECTION 61%  COMPARISON:  06/13/2018 percutaneous drain replacement, 05/10/2018 CT and prior studies FINDINGS: Lower chest: Mild cardiomegaly and new small RIGHT pleural effusion noted. Hepatobiliary: The liver and gallbladder are unremarkable. No biliary dilatation. Pancreas: Unremarkable Spleen: Unremarkable Adrenals/Urinary Tract: The kidneys, adrenal glands and bladder are unremarkable except for a LEFT renal cyst. Stomach/Bowel: There is no evidence of bowel obstruction or definite bowel wall thickening. Colonic diverticulosis noted without definite evidence of diverticulitis.  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 Aortic atherosclerotic calcifications are again noted. Reproductive: Status post hysterectomy. No adnexal masses. Other: Retroperitoneal and mesenteric edema identified. No evidence of ascites or pneumoperitoneum. A percutaneous drainage catheter within the LEFT pelvis is unchanged without residual abscess. Musculoskeletal: No acute or suspicious bony abnormalities identified. IMPRESSION: 1. New small RIGHT pleural effusion and nonspecific retroperitoneal/mesenteric edema. 2. LEFT pelvic drainage catheter again noted - no evidence of residual abscess. 3. 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 4. Cardiomegaly. 5.  Aortic Atherosclerosis (ICD10-I70.0). Electronically Signed   By: Margarette Canada M.D.   On: 06/30/2018 19:36

## 2018-07-01 NOTE — Telephone Encounter (Signed)
Called Jo Parker and let him know that he can discard the leftover magnesium and potassium bottles down the sink per Carolynn Sayers at [facility].  He verbalized agreement.

## 2018-07-01 NOTE — H&P (Signed)
H&P        History and Physical    Jo Parker OAC:[id number] DOB: 08-31-1945 DOA: 06/30/2018  PCP: [name], MD (Confirm with patient/family/NH records and if not entered, this has to be entered at [facility] point of entry) Patient coming from: Oncology office  I have personally briefly reviewed patient's old medical records in [place]  Chief Complaint: Weakness  HPI: Jo Parker is a 73 y.o. female with medical history significant of peritoneal carcinomatosis, ovarian cancer, malnutrition, presents with weakness.  Patient has been living back at home in [place] for the past few weeks.  When she was visited by her son he noted she was very weak.  Patient admits to a fall about a week ago where she hit her head.  At the oncology office patient had a fever and seemed a little altered.  She had a low mag and was given some IV magnesium and IV fluids and referred over to the ED.  She really did not complain of very much and she is quite stoic.  Patient's last chemo was August per son.  He states she was told she was deemed cancer free.  She is anorexic and eats very little.  She denies any obvious bleeding.  Patient's cancer course is complicated.  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  Patient is status post debulking and pelvic drain placement October 2019.  She is status post total abdominal hysterectomy with adjuvant chemo in August 2019.  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  She was found to have worsening anemia with a hemoglobin of 7.7.  Her baseline appears to be around 10.  She did have a temperature.  She had a mildly elevated troponin but denied any chest pain.  Patient had unremarkable lactic acid.   UA done at the outpatient office showed small leukocytes but no bacteria and no nitrites.  Patient flu  was negative.  Her stool was negative for blood per ED doctor notes that she had very little stool to test.  Chest x-ray shows small effusion.  Review of Systems: + for generalized weakness positive for anorexia denies abdominal pain nausea and vomiting but has poor taste for food denies chest pain or shortness of breath.  Positive for fever.  All others reviewed with patient  and are  negative unless otherwise stated   Past Medical History:  Diagnosis Date  . Acute on chronic respiratory failure with hypoxia ([place]) 03/07/2018  . Anxiety and depression   . Aortic aneurysm ([place])    see ov note 03/2017   . Arthritis   . Aspiration pneumonia ([place])   . Carcinomatosis ([place]) 10/2017  . CHF (congestive heart failure) ([name])    diastolic dysfunction  . COPD (chronic obstructive pulmonary disease) ([place]) 10/23/2017  . Depression   . Family history of breast cancer   . Family history of prostate cancer   . Family history of uterine cancer   . History of diverticulitis   . HLD (hyperlipidemia) 10/23/2017  . Hypercholesterolemia   . Hypertension   . Mitral valve prolapse   . Pelvic abscess in female   . Sleep apnea    cpap machine mask and  tubing in [place] per patient.  Patient does not have mask or tubing to bring on DOS of 01/27/2018.   Marland Kitchen Uterine cancer ([place])    had partial hysterectomy at 27    Past Surgical History:  Procedure Laterality Date  . ABDOMINAL HYSTERECTOMY    . cardiac monitor implant    . CEREBRAL ANEURYSM REPAIR    . CERVICAL SPINE SURGERY     bone spurs  . DEBULKING N/A 01/27/2018   Procedure: RADICAL DEBULKING;  Surgeon: [name], MD;  Location: [facility];  Service: Gynecology;  Laterality: N/A;  . IR CATHETER TUBE CHANGE  06/13/2018  . IR FLUORO GUIDE PORT INSERTION RIGHT  10/14/2017  . IR PARACENTESIS  10/14/2017  . IR RADIOLOGIST EVAL & MGMT  04/26/2018  . IR  RADIOLOGIST EVAL & MGMT  05/10/2018  . IR RADIOLOGIST EVAL & MGMT  05/25/2018  . IR US GUIDE VASC ACCESS RIGHT  10/14/2017  . LAPAROTOMY N/A 01/27/2018   Procedure: EXPLORATORY LAPAROTOMY WITH REMOVAL OF BILATERAL TUBES AND OVARIES;  Surgeon: [name], MD;  Location: [facility];  Service: Gynecology;  Laterality: N/A;  . LAPAROTOMY N/A 01/31/2018   Procedure: EXPLORATORY LAPAROTOMY, ABDOMINAL WASHOUT;  Surgeon: Isabel Caprice, MD;  Location: [facility];  Service: Gynecology;  Laterality: N/A;  . OMENTECTOMY N/A 01/27/2018   Procedure: OMENTECTOMY;  Surgeon: [name], MD;  Location: [facility];  Service: Gynecology;  Laterality: N/A;  . PARTIAL HYSTERECTOMY  1971   "precancer"     reports that she quit smoking about 8 months ago. She has a 55.00 pack-year smoking history. She has never used smokeless tobacco. She reports that she does not drink alcohol or use drugs.  Allergies  Allergen Reactions  . Oxycodone Other (See Comments)    confusion    Family History  Problem Relation Age of Onset  . Endometrial cancer Sister 34       endometrial ca  . Endometrial cancer Paternal Aunt 80  . Breast cancer Paternal Aunt   . Stomach cancer Maternal Grandfather        d. 23s  . Prostate cancer Paternal Uncle   . Breast cancer Cousin        pat first cousin  . Breast cancer Cousin        pat first cousin's daughter  . Lung cancer Father        lung ca  . Heart disease Mother   . Stroke Paternal Grandmother   . Endometrial cancer Cousin        paternal first cousin's daughter  . Breast cancer Other        niece dx less than 58s  . Breast cancer Other        niece, dx under 53     Prior to Admission medications   Medication Sig Start Date End Date Taking? Authorizing Provider  acetaminophen (TYLENOL) 325 MG tablet Take 2 tablets (650 mg total) by mouth every 6 (six) hours as needed for mild pain, fever or headache. 03/04/18  Yes Cross, Melissa D, NP  carvedilol (COREG) 12.5 MG tablet  Take 12.5 mg by mouth 2 (two) times daily with a meal.   Yes [provider]  lidocaine-prilocaine (EMLA) cream Apply 1 application topically as needed. Patient taking differently: Apply 1 application topically as needed (port access).  03/31/18  Yes Gorsuch, Ni, MD  magnesium oxide (MAG-OX) 400 MG tablet Take 400 mg by mouth daily.   Yes [provider]  mirtazapine (  REMERON) 30 MG tablet Take 1 tablet (30 mg total) by mouth at bedtime. 05/13/18  Yes [name], MD  Multiple Vitamin (MULTIVITAMIN WITH MINERALS) TABS tablet Take 1 tablet by mouth daily.   Yes [provider]    Physical Exam: Vitals:   06/30/18 1518 06/30/18 2019 06/30/18 2141 07/01/18 0014  BP:  (!) 135/103 (!) 98/50   Pulse:  (!) 104 93   Resp:  14 (!) 30   Temp:  99.8 F (37.7 C) 99.1 F (37.3 C) 98.7 F (37.1 C)  TempSrc:  Oral Oral Oral  SpO2: 100% 100% 98%   Weight:      Height:        Constitutional: NAD, calm, comfortable Vitals:   06/30/18 1518 06/30/18 2019 06/30/18 2141 07/01/18 0014  BP:  (!) 135/103 (!) 98/50   Pulse:  (!) 104 93   Resp:  14 (!) 30   Temp:  99.8 F (37.7 C) 99.1 F (37.3 C) 98.7 F (37.1 C)  TempSrc:  Oral Oral Oral  SpO2: 100% 100% 98%   Weight:      Height:       Eyes: PERRL, lids and conjunctivae normal ENMT: Mucous membranes are dry. Posterior pharynx clear of any exudate or lesions.Normal dentition.  Neck: normal, supple,  Respiratory: clear to auscultation bilaterally, no wheezing, no crackles. Normal respiratory effort. No accessory muscle use.  Cardiovascular: Regular rate and rhythm, + murmurs No extremity edema. 2+ pedal pulses.  Abdomen: Mild left lower quadrant tenderness tenderness, drain in the left lower quadrant well-healed midline surgical scar.  Bowel sounds positive.  Musculoskeletal: no clubbing / cyanosis.  Skin: no rashes, lesions, ulcers. No induration Neurologic: CN 2-12 grossly intact.  Generally weak but moves all 4  extremities equally about 4-5 strength Psychiatric: Normal judgment and insight. Alert and oriented x 3. Normal mood.     Labs on Admission: I have personally reviewed following labs and imaging studies  CBC: Recent Labs  Lab 06/30/18 0924 06/30/18 1704  WBC 9.2 12.7*  NEUTROABS 6.7 10.7*  HGB 11.5* 7.7*  HCT 36.9 24.9*  MCV 64.1* 66.4*  PLT 245 789   Basic Metabolic Panel: Recent Labs  Lab 06/30/18 0924 06/30/18 1704 06/30/18 1705  NA 137 134*  --   K 3.8 3.1*  --   CL 98 103  --   CO2 24 21*  --   GLUCOSE 122* 121*  --   BUN 15 14  --   CREATININE 0.91 0.61  --   CALCIUM 9.8 7.6*  --   MG 1.6*  --  2.5*   GFR: Estimated Creatinine Clearance: 45.9 mL/min (by C-G formula based on SCr of 0.61 mg/dL). Liver Function Tests: Recent Labs  Lab 06/30/18 0924 06/30/18 1704  AST 54* 56*  ALT 36 34  ALKPHOS 111 77  BILITOT 1.1 1.3*  PROT 9.2* 7.2  ALBUMIN 3.4* 3.0*   No results for input(s): LIPASE, AMYLASE in the last 168 hours. No results for input(s): AMMONIA in the last 168 hours. Coagulation Profile: Recent Labs  Lab 06/30/18 1704  INR 1.54   Cardiac Enzymes: Recent Labs  Lab 06/30/18 1705 06/30/18 2210  TROPONINI 0.05* 0.05*   BNP (last 3 results) No results for input(s): PROBNP in the last 8760 hours. HbA1C: No results for input(s): HGBA1C in the last 72 hours. CBG: No results for input(s): GLUCAP in the last 168 hours. Lipid Profile: No results for input(s): CHOL, HDL, LDLCALC, TRIG, CHOLHDL, LDLDIRECT in  the last 72 hours. Thyroid Function Tests: No results for input(s): TSH, T4TOTAL, FREET4, T3FREE, THYROIDAB in the last 72 hours. Anemia Panel: Recent Labs    06/30/18 2210  VITAMINB12 (NOTE)  FOLATE 22.0  FERRITIN 705*  TIBC 144*  IRON 11*   Urine analysis:    Component Value Date/Time   COLORURINE YELLOW 06/30/2018 1414   APPEARANCEUR CLEAR 06/30/2018 1414   LABSPEC 1.013 06/30/2018 1414   PHURINE 6.0 06/30/2018 1414    GLUCOSEU NEGATIVE 06/30/2018 1414   HGBUR SMALL (A) 06/30/2018 1414   BILIRUBINUR NEGATIVE 06/30/2018 1414   KETONESUR 5 (A) 06/30/2018 1414   PROTEINUR NEGATIVE 06/30/2018 1414   NITRITE NEGATIVE 06/30/2018 1414   LEUKOCYTESUR SMALL (A) 06/30/2018 1414    Radiological Exams on Admission: Dg Chest 2 View  Result Date: 06/30/2018 CLINICAL DATA:  Fever.  History of uterine cancer. EXAM: CHEST - 2 VIEW COMPARISON:  March 12, 2018 FINDINGS: The right Port-A-Cath is stable. No pneumothorax. Possible mild pulmonary venous congestion. No overt edema or focal infiltrate. The cardiomediastinal silhouette is unremarkable. No pneumothorax. Probable tiny pleural effusions. IMPRESSION: Tiny pleural effusions and possible mild pulmonary venous congestion. No overt edema or focal infiltrate. Electronically Signed   By: Dorise Bullion III M.D   On: 06/30/2018 15:58   Ct Head Wo Contrast  Result Date: 06/30/2018 CLINICAL DATA:  73 year old female with headache and confusion following fall and head injury 1 week ago. EXAM: CT HEAD WITHOUT CONTRAST TECHNIQUE: Contiguous axial images were obtained from the base of the skull through the vertex without intravenous contrast. COMPARISON:  03/03/2018 and prior CTs FINDINGS: Brain: No evidence of acute infarction, hemorrhage, hydrocephalus, extra-axial collection or mass lesion/mass effect. Atrophy, chronic small-vessel white matter ischemic changes and LEFT aneurysm clip again noted. Vascular: Carotid atherosclerotic calcifications again noted. Skull: No acute abnormality. LEFT temporoparietal craniotomy/craniectomy noted. Sinuses/Orbits: No acute abnormality Other: None IMPRESSION: 1. No evidence of acute intracranial abnormality 2. Atrophy and chronic small-vessel white matter ischemic changes. LEFT-sided aneurysm clip again noted. Electronically Signed   By: Margarette Canada M.D.   On: 06/30/2018 19:25   Ct Abdomen Pelvis W Contrast  Result Date: 06/30/2018 CLINICAL  DATA:  72 year old female with abdominal and pelvic pain, fever and nausea. Recent LEFT pelvic abscess and percutaneous drain. EXAM: CT ABDOMEN AND PELVIS WITH CONTRAST TECHNIQUE: Multidetector CT imaging of the abdomen and pelvis was performed using the standard protocol following bolus administration of intravenous contrast. CONTRAST:  47mL ISOVUE-300 IOPAMIDOL (ISOVUE-300) INJECTION 61% COMPARISON:  06/13/2018 percutaneous drain replacement, 05/10/2018 CT and prior studies FINDINGS: Lower chest: Mild cardiomegaly and new small RIGHT pleural effusion noted. Hepatobiliary: The liver and gallbladder are unremarkable. No biliary dilatation. Pancreas: Unremarkable Spleen: Unremarkable Adrenals/Urinary Tract: The kidneys, adrenal glands and bladder are unremarkable except for a LEFT renal cyst. Stomach/Bowel: There is no evidence of bowel obstruction or definite bowel wall thickening. Colonic diverticulosis noted without definite evidence of diverticulitis. 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 Aortic atherosclerotic calcifications are again noted. Reproductive: Status post hysterectomy. No adnexal masses. Other: Retroperitoneal and mesenteric edema identified. No evidence of ascites or pneumoperitoneum. A percutaneous drainage catheter within the LEFT pelvis is unchanged without residual abscess. Musculoskeletal: No acute or suspicious bony abnormalities identified. IMPRESSION: 1. New small RIGHT pleural effusion and nonspecific retroperitoneal/mesenteric edema. 2. LEFT pelvic drainage catheter again noted - no evidence of residual abscess. 3. 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 4. Cardiomegaly. 5.  Aortic Atherosclerosis (ICD10-I70.0). Electronically Signed   By: Margarette Canada M.D.   On: 06/30/2018 19:36    EKG: Independently reviewed.  Sinus tachycardia rate of 106 poor R wave progression  Assessment/Plan Principal Problem:   Fever in  adult Active Problems:   Anemia   SIRS (systemic inflammatory response syndrome) ([name])   Primary peritoneal carcinomatosis ([name])   Severe protein-calorie malnutrition ([name])   Hypokalemia   Weakness generalized  -SIRS with no obvious source for infection.  Follow blood and urine cultures.  Continue empiric cefepime Flagyl and vancomycin per pharmacy protocol. Hold Coreg Cont IVF  -Transfuse 1 unit of packed red blood cells.  Patient agreed to transfusion.  Will heme check stools again.  -Will check iron studies before transfusion -Consult nutrition -Continue potassium replacement -Physical therapy consultation   DVT prophylaxis: SCD Code Status: Full  Family Communication: discussed with son at bedside Disposition Plan: home with son 2 days' Admission status: [place] MD [facility] Pager [phone]  If 7PM-7AM, please contact night-coverage [email or web address] Password [facility]  07/01/2018, 12:29 AM

## 2018-07-01 NOTE — Care Management Obs Status (Signed)
[place] NOTIFICATION   Patient Details  Name: Jo Parker MRN: [id number] Date of Birth: 12/30/1945   Medicare Observation Status Notification Given:  Yes    MahabirJuliann Pulse, RN 07/01/2018, 3:37 PM

## 2018-07-01 NOTE — Consult Note (Signed)
[facility]  Total days of antibiotics 2        Day 1 cefazolin               Reason for Consult: Staph Aureus Bacteremia     Referring Physician: Bonner Puna  Principal Problem:   Fever in adult Active Problems:   Primary peritoneal carcinomatosis ([place])   Severe protein-calorie malnutrition ([place])   Hypokalemia   Anemia   Weakness generalized   SIRS (systemic inflammatory response syndrome) ([name])    HPI: Jo Parker is a 73 y.o. female  with hx of ovarian/uterine ca s/p TAH with adjuvant chemo in aug 2019 c/b with peritoneal carcinomatosis s/p debulking, pelvic abscess/ chronic fistula s/p drain , malnutrition,  now admitted for fever, altered mental status initially to her oncologist office then referred to hospital for her presentation. In the ED, she was found to have some abdominal discomfort on exam but labs showed leukocytosis of 13K with bandemia and other labs were not significantly abnormal. LA WNL, worsening anemia, mild hypokalemia. UA had pyuria but patient denied dysuria.her blood cx are growing MSSA per [facility]. She was initially on vanco and cefepime and now narrowed to cefazolin. Patient has portacath in place.  She is still confused and unable to state exactly how many days she has feeling poorly. The patient reports that she has had some tenderness to her port site that she noticed when her grand-daughter was laying on her chest  She has had weight loss over the month  Previously followed by dr Megan Salon.  Past Medical History:  Diagnosis Date  . Acute on chronic respiratory failure with hypoxia ([place]) 03/07/2018  . Anxiety and depression   . Aortic aneurysm ([place])    see ov note 03/2017   . Arthritis   . Aspiration pneumonia ([place])   . Carcinomatosis ([place]) 10/2017  . CHF (congestive heart failure) ([name])    diastolic dysfunction  . COPD (chronic obstructive pulmonary disease) ([place]) 10/23/2017  . Depression   . Family history of breast  cancer   . Family history of prostate cancer   . Family history of uterine cancer   . History of diverticulitis   . HLD (hyperlipidemia) 10/23/2017  . Hypercholesterolemia   . Hypertension   . Mitral valve prolapse   . Pelvic abscess in female   . Sleep apnea    cpap machine mask and tubing in [place] per patient.  Patient does not have mask or tubing to bring on DOS of 01/27/2018.   Marland Kitchen Uterine cancer ([place])    had partial hysterectomy at 27    Allergies:  Allergies  Allergen Reactions  . Oxycodone Other (See Comments)    confusion   MEDICATIONS: . feeding supplement (ENSURE ENLIVE)  237 mL Oral BID BM  . magnesium oxide  400 mg Oral Daily  . mirtazapine  30 mg Oral QHS  . multivitamin with minerals  1 tablet Oral Daily    Social History   Tobacco Use  . Smoking status: Former Smoker    Packs/day: 1.00    Years: 55.00    Pack years: 55.00    Last attempt to quit: 10/06/2017    Years since quitting: 0.7  . Smokeless tobacco: Never Used  Substance Use Topics  . Alcohol use: Never    Frequency: Never  . Drug use: Never    Family History  Problem Relation Age of Onset  . Endometrial cancer Sister 49  endometrial ca  . Endometrial cancer Paternal Aunt 80  . Breast cancer Paternal Aunt   . Stomach cancer Maternal Grandfather        d. 42s  . Prostate cancer Paternal Uncle   . Breast cancer Cousin        pat first cousin  . Breast cancer Cousin        pat first cousin's daughter  . Lung cancer Father        lung ca  . Heart disease Mother   . Stroke Paternal Grandmother   . Endometrial cancer Cousin        paternal first cousin's daughter  . Breast cancer Other        niece dx less than 33s  . Breast cancer Other        niece, dx under 13     Review of Systems  Constitutional: positive for fever, chills, diaphoresis, activity change, appetite change, fatigue and unexpected weight change.  HENT: Negative for congestion, sore throat, rhinorrhea,  sneezing, trouble swallowing and sinus pressure.  Eyes: Negative for photophobia and visual disturbance.  Respiratory: Negative for cough, chest tightness, shortness of breath, wheezing and stridor.  Cardiovascular: Negative for chest pain, palpitations and leg swelling.  Gastrointestinal: mild abdominal tenderness. Negative for nausea, vomiting, abdominal pain, diarrhea, constipation, blood in stool, abdominal distention and anal bleeding.  Genitourinary: Negative for dysuria, hematuria, flank pain and difficulty urinating.  Musculoskeletal: Negative for myalgias, back pain, joint swelling, arthralgias and gait problem.  Skin: Negative for color change, pallor, rash and wound.  Neurological: Negative for dizziness, tremors, weakness and light-headedness.  Hematological: Negative for adenopathy. Does not bruise/bleed easily.  Psychiatric/Behavioral: Negative for behavioral problems, confusion, sleep disturbance, dysphoric mood, decreased concentration and agitation.     OBJECTIVE: Temp:  [97.5 F (36.4 C)-100.7 F (38.2 C)] 97.5 F (36.4 C) (01/24 1254) Pulse Rate:  [76-104] 76 (01/24 1523) Resp:  [14-30] 18 (01/24 1254) BP: (97-147)/(47-103) 147/67 (01/24 1523) SpO2:  [98 %-100 %] 100 % (01/24 1254) Physical Exam  Constitutional:  oriented to person, place only. appears under-nourished. rigorsNo distress.  HENT: [place]/AT, PERRLA, no scleral icterus Mouth/Throat: Oropharynx is clear and moist. No oropharyngeal exudate.  Cardiovascular: tachycardia, regular rhythm and normal heart sounds. Exam reveals no gallop and no friction rub.  No murmur heard.   Pulmonary/Chest: Effort normal and breath sounds normal. No respiratory distress.  has no wheezes. Chest wall = no redness but does have some tenderness to the lateral side of the port  Neck = supple, no nuchal rigidity Abdominal: Soft. Bowel sounds are normal.  exhibits no distension. There is no tenderness.  Lymphadenopathy: no cervical  adenopathy. No axillary adenopathy Neurological: alert and oriented to person, place, and time. . Skin: Skin is warm and dry. No rash noted. No erythema.  Ext: cool Psychiatric: a normal mood and affect.  behavior is normal.    LABS: Results for orders placed or performed during the hospital encounter of 06/30/18 (from the past 48 hour(s))  Influenza panel by PCR (type A & B)     Status: None   Collection Time: 06/30/18  4:41 PM  Result Value Ref Range   Influenza A By PCR NEGATIVE NEGATIVE   Influenza B By PCR NEGATIVE NEGATIVE    Comment: (NOTE) The Xpert Xpress Flu assay is intended as an aid in the diagnosis of  influenza and should not be used as a sole basis for treatment.  This  assay is FDA approved  for nasopharyngeal swab specimens only. Nasal  washings and aspirates are unacceptable for Xpert Xpress Flu testing. Performed at [facility], [place] [address] [place], [place] [address]   Comprehensive metabolic panel     Status: Abnormal   Collection Time: 06/30/18  5:04 PM  Result Value Ref Range   Sodium 134 (L) 135 - 145 mmol/L   Potassium 3.1 (L) 3.5 - 5.1 mmol/L   Chloride 103 98 - 111 mmol/L   CO2 21 (L) 22 - 32 mmol/L   Glucose, Bld 121 (H) 70 - 99 mg/dL   BUN 14 8 - 23 mg/dL   Creatinine, Ser 0.61 0.44 - 1.00 mg/dL   Calcium 7.6 (L) 8.9 - 10.3 mg/dL   Total Protein 7.2 6.5 - 8.1 g/dL   Albumin 3.0 (L) 3.5 - 5.0 g/dL   AST 56 (H) 15 - 41 U/L   ALT 34 0 - 44 U/L   Alkaline Phosphatase 77 38 - 126 U/L   Total Bilirubin 1.3 (H) 0.3 - 1.2 mg/dL   GFR calc non Af Amer >60 >60 mL/min   GFR calc Af Amer >60 >60 mL/min   Anion gap 10 5 - 15    Comment: Performed at [facility] [place] [address] [place], [place] [address]  Lactic acid, plasma     Status: None   Collection Time: 06/30/18  5:04 PM  Result Value Ref Range   Lactic Acid, Venous 0.9 0.5 - 1.9 mmol/L    Comment: Performed at [facility], [place]  [address] [place], [place] [address]  CBC with Differential     Status: Abnormal   Collection Time: 06/30/18  5:04 PM  Result Value Ref Range   WBC 12.7 (H) 4.0 - 10.5 K/uL   RBC 3.75 (L) 3.87 - 5.11 MIL/uL   Hemoglobin 7.7 (L) 12.0 - 15.0 g/dL    Comment: Reticulocyte Hemoglobin testing may be clinically indicated, consider ordering this additional test [id number]    HCT 24.9 (L) 36.0 - 46.0 %   MCV 66.4 (L) 80.0 - 100.0 fL   MCH 20.5 (L) 26.0 - 34.0 pg   MCHC 30.9 30.0 - 36.0 g/dL   RDW 15.6 (H) 11.5 - 15.5 %   Platelets 211 150 - 400 K/uL   nRBC 0.0 0.0 - 0.2 %   Neutrophils Relative % 83 %   Neutro Abs 10.7 (H) 1.7 - 7.7 K/uL   Lymphocytes Relative 9 %   Lymphs Abs 1.1 0.7 - 4.0 K/uL   Monocytes Relative 7 %   Monocytes Absolute 0.8 0.1 - 1.0 K/uL   Eosinophils Relative 0 %   Eosinophils Absolute 0.0 0.0 - 0.5 K/uL   Basophils Relative 0 %   Basophils Absolute 0.0 0.0 - 0.1 K/uL   Immature Granulocytes 1 %   Abs Immature Granulocytes 0.10 (H) 0.00 - 0.07 K/uL    Comment: Performed at [facility], [place] [address], [place], [place] [address]  Protime-INR     Status: Abnormal   Collection Time: 06/30/18  5:04 PM  Result Value Ref Range   Prothrombin Time 18.4 (H) 11.4 - 15.2 seconds   INR 1.54     Comment: Performed at [facility], [place] [address] [place], [place] [address]  Magnesium     Status: Abnormal   Collection Time: 06/30/18  5:05 PM  Result Value Ref Range   Magnesium 2.5 (H) 1.7 - 2.4 mg/dL    Comment:  Performed at [facility], [place] [address] [place], [place] [address]  Troponin I - ONCE - STAT     Status: Abnormal   Collection Time: 06/30/18  5:05 PM  Result Value Ref Range   Troponin I 0.05 (HH) <0.03 ng/mL    Comment: CRITICAL RESULT CALLED TO, READ BACK BY AND VERIFIED WITH: M.BRILL AT 1831 ON 06/30/18 BY N.THOMPSON Performed at [facility], [place] [address], [place], [place]  [address]   Brain natriuretic peptide     Status: Abnormal   Collection Time: 06/30/18  5:05 PM  Result Value Ref Range   B Natriuretic Peptide 126.9 (H) 0.0 - 100.0 pg/mL    Comment: Performed at [facility], [place] [address], [place], [place] [address]  Culture, blood (Routine x 2)     Status: None (Preliminary result)   Collection Time: 06/30/18  5:15 PM  Result Value Ref Range   Specimen Description      BLOOD PORT Performed at [place] [address] [place], [place] [address]    Special Requests      BOTTLES DRAWN AEROBIC AND ANAEROBIC Blood Culture results may not be optimal due to an inadequate volume of blood received in culture bottles Performed at [facility], [place] [address], [place], [place] [address]    Culture  Setup Time      GRAM POSITIVE COCCI IN CLUSTERS IN BOTH AEROBIC AND ANAEROBIC BOTTLES CRITICAL RESULT CALLED TO, READ BACK BY AND VERIFIED WITH: Shelda Jakes PHARMD, AT Azzie Almas 07/01/18 BY Rush Landmark Performed at [name], [place] [address], [place], [place] [address]    Culture GRAM POSITIVE COCCI    Report Status PENDING   Occult blood, poc device     Status: None   Collection Time: 06/30/18  8:20 PM  Result Value Ref Range   Fecal Occult Bld NEGATIVE NEGATIVE  Lactic acid, plasma     Status: None   Collection Time: 06/30/18 10:10 PM  Result Value Ref Range   Lactic Acid, Venous 1.0 0.5 - 1.9 mmol/L    Comment: Performed at [facility], [place] [address] [place], [place] [address]  Iron and TIBC     Status: Abnormal   Collection Time: 06/30/18 10:10 PM  Result Value Ref Range   Iron 11 (L) 28 - 170 ug/dL   TIBC 144 (L) 250 - 450 ug/dL   Saturation Ratios 8 (L) 10.4 - 31.8 %   UIBC 133 ug/dL    Comment: Performed at [facility], [place] [address] [place], [place] [address]  Ferritin     Status: Abnormal   Collection Time: 06/30/18 10:10 PM  Result Value Ref  Range   Ferritin 705 (H) 11 - 307 ng/mL    Comment: Performed at [facility], [place] [address] [place], [place] [address]  Folate     Status: None   Collection Time: 06/30/18 10:10 PM  Result Value Ref Range   Folate 22.0 >5.9 ng/mL    Comment: Performed at [facility], [place] [address], [place], [place] [address]  Vitamin B12     Status: None   Collection Time: 06/30/18 10:10 PM  Result Value Ref Range   Vitamin B-12 (NOTE) 180 - 914 pg/mL    Comment: This assay is not validated for testing neonatal or myeloproliferative syndrome specimens for Vitamin B12 levels. Performed at [facility], [place]  [address] [place], [place] [address]   Troponin I - Now Then Q3H     Status: Abnormal   Collection Time: 06/30/18 10:10 PM  Result Value Ref Range   Troponin I 0.05 (HH) <0.03 ng/mL    Comment: CRITICAL VALUE NOTED.  VALUE IS CONSISTENT WITH PREVIOUSLY REPORTED AND CALLED VALUE. Performed at [facility], [place] [address] [place], [place] [address]   Type and screen [place]     Status: None (Preliminary result)   Collection Time: 06/30/18 10:10 PM  Result Value Ref Range   ABO/RH(D) B POS    Antibody Screen NEG    Sample Expiration 07/03/2018    Unit Number [id number]    Blood Component Type RCLI PHER 1    Unit division 00    Status of Unit ISSUED    Transfusion Status OK TO TRANSFUSE    Crossmatch Result      Compatible Performed at [facility], [place] [address], [place], [place] [address]   Prepare RBC     Status: None   Collection Time: 06/30/18 10:10 PM  Result Value Ref Range   Order Confirmation      ORDER PROCESSED BY BLOOD BANK Performed at [place] [address], [place], [place] [address]   Troponin I - Now Then Q3H     Status: Abnormal   Collection Time: 07/01/18  8:52 AM  Result Value Ref Range   Troponin I 0.04 (HH) <0.03  ng/mL    Comment: CRITICAL VALUE NOTED.  VALUE IS CONSISTENT WITH PREVIOUSLY REPORTED AND CALLED VALUE. Performed at [facility], [place] [address], [place], [place] [address]   CBC     Status: Abnormal   Collection Time: 07/01/18  8:52 AM  Result Value Ref Range   WBC 9.7 4.0 - 10.5 K/uL   RBC 4.20 3.87 - 5.11 MIL/uL   Hemoglobin 9.2 (L) 12.0 - 15.0 g/dL   HCT 29.5 (L) 36.0 - 46.0 %   MCV 70.2 (L) 80.0 - 100.0 fL   MCH 21.9 (L) 26.0 - 34.0 pg   MCHC 31.2 30.0 - 36.0 g/dL   RDW 19.6 (H) 11.5 - 15.5 %   Platelets 175 150 - 400 K/uL   nRBC 0.0 0.0 - 0.2 %    Comment: Performed at [facility], [place] [address] [place], [place] [address]  Basic metabolic panel     Status: Abnormal   Collection Time: 07/01/18  8:52 AM  Result Value Ref Range   Sodium 137 135 - 145 mmol/L   Potassium 3.3 (L) 3.5 - 5.1 mmol/L   Chloride 108 98 - 111 mmol/L   CO2 22 22 - 32 mmol/L   Glucose, Bld 113 (H) 70 - 99 mg/dL   BUN 13 8 - 23 mg/dL   Creatinine, Ser 0.69 0.44 - 1.00 mg/dL   Calcium 7.8 (L) 8.9 - 10.3 mg/dL   GFR calc non Af Amer >60 >60 mL/min   GFR calc Af Amer >60 >60 mL/min   Anion gap 7 5 - 15    Comment: Performed at [facility], [place] [address], [place], [place] [address]  Urinalysis, Routine w reflex microscopic     Status: Abnormal   Collection Time: 07/01/18  2:57 PM  Result Value Ref Range   Color, Urine YELLOW YELLOW   APPearance CLEAR CLEAR   Specific Gravity, Urine 1.030 1.005 - 1.030   pH 5.0 5.0 -  8.0   Glucose, UA NEGATIVE NEGATIVE mg/dL   Hgb urine dipstick NEGATIVE NEGATIVE   Bilirubin Urine NEGATIVE NEGATIVE   Ketones, ur NEGATIVE NEGATIVE mg/dL   Protein, ur NEGATIVE NEGATIVE mg/dL   Nitrite NEGATIVE NEGATIVE   Leukocytes, UA TRACE (A) NEGATIVE   RBC / HPF 0-5 0 - 5 RBC/hpf   WBC, UA 6-10 0 - 5 WBC/hpf   Bacteria, UA RARE (A) NONE SEEN   Squamous Epithelial / LPF 0-5 0 - 5   Mucus PRESENT     Comment:  Performed at [facility], [place] [address] [place], [place] [address]    MICRO: 1/23 blood cx MSSA IMAGING: Dg Chest 2 View  Result Date: 06/30/2018 CLINICAL DATA:  Fever.  History of uterine cancer. EXAM: CHEST - 2 VIEW COMPARISON:  March 12, 2018 FINDINGS: The right Port-A-Cath is stable. No pneumothorax. Possible mild pulmonary venous congestion. No overt edema or focal infiltrate. The cardiomediastinal silhouette is unremarkable. No pneumothorax. Probable tiny pleural effusions. IMPRESSION: Tiny pleural effusions and possible mild pulmonary venous congestion. No overt edema or focal infiltrate. Electronically Signed   By: Dorise Bullion III M.D   On: 06/30/2018 15:58   Ct Head Wo Contrast  Result Date: 06/30/2018 CLINICAL DATA:  73 year old female with headache and confusion following fall and head injury 1 week ago. EXAM: CT HEAD WITHOUT CONTRAST TECHNIQUE: Contiguous axial images were obtained from the base of the skull through the vertex without intravenous contrast. COMPARISON:  03/03/2018 and prior CTs FINDINGS: Brain: No evidence of acute infarction, hemorrhage, hydrocephalus, extra-axial collection or mass lesion/mass effect. Atrophy, chronic small-vessel white matter ischemic changes and LEFT aneurysm clip again noted. Vascular: Carotid atherosclerotic calcifications again noted. Skull: No acute abnormality. LEFT temporoparietal craniotomy/craniectomy noted. Sinuses/Orbits: No acute abnormality Other: None IMPRESSION: 1. No evidence of acute intracranial abnormality 2. Atrophy and chronic small-vessel white matter ischemic changes. LEFT-sided aneurysm clip again noted. Electronically Signed   By: Margarette Canada M.D.   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 Electronically Signed   By:  Margarette Canada M.D.   On: 07/01/2018 10:19   Result Date: 07/01/2018 CLINICAL DATA:  73 year old female with abdominal and pelvic pain, fever and nausea. Recent LEFT pelvic abscess and percutaneous drain. EXAM: CT ABDOMEN AND PELVIS WITH CONTRAST TECHNIQUE: Multidetector CT imaging of the abdomen and pelvis was performed using the standard protocol following bolus administration of intravenous contrast. CONTRAST:  52mL ISOVUE-300 IOPAMIDOL (ISOVUE-300) INJECTION 61% COMPARISON:  06/13/2018 percutaneous drain replacement, 05/10/2018 CT and prior studies FINDINGS: Lower chest: Mild cardiomegaly and new small RIGHT pleural effusion noted. Hepatobiliary: The liver and gallbladder are unremarkable. No biliary dilatation. Pancreas: Unremarkable Spleen: Unremarkable Adrenals/Urinary Tract: The kidneys, adrenal glands and bladder are unremarkable except for a LEFT renal cyst. Stomach/Bowel: There is no evidence of bowel obstruction or definite bowel wall thickening. Colonic diverticulosis noted without definite evidence of diverticulitis. 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 Aortic atherosclerotic calcifications are again noted. Reproductive: Status post hysterectomy. No adnexal masses. Other: Retroperitoneal and mesenteric edema identified. No evidence of ascites or pneumoperitoneum. A percutaneous drainage catheter within the LEFT pelvis is unchanged without residual abscess. Musculoskeletal: No acute or suspicious bony abnormalities identified. IMPRESSION: 1. New small RIGHT pleural effusion and nonspecific retroperitoneal/mesenteric edema. 2. LEFT pelvic  drainage catheter again noted - no evidence of residual abscess. 3. 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 4. Cardiomegaly. 5.  Aortic Atherosclerosis (ICD10-I70.0). Electronically Signed: By: Margarette Canada M.D. On: 06/30/2018 19:36    Assessment/Plan:  73yo F immunocompromised host with  indwelling portacath and indwelling pelvic drain for chronic pelvic fistula admitted for SIRS found to have MSSA bacteremia  - continue on cefazolin 2gm IV q8  - needs to have portacath remove - recommend to move to step down due to still having SIRS with ongoing fevers, tachycardia, concern that since her port is inplace that we don't have source control - still at risk for decompensation - spoke with dr Bonner Puna to communicate with IR to see how to remove portacath - recommend repeat blood cx once port removed - will need to arrange for tee  Hypokalemia = receiving IvF and potassium supplementation   Hx of pelvic abscess= drain remains in place since still has fistula but no organized abscess noted on recent ct.

## 2018-07-02 ENCOUNTER — Inpatient Hospital Stay (HOSPITAL_COMMUNITY): Payer: Medicare PPO

## 2018-07-02 ENCOUNTER — Encounter (HOSPITAL_COMMUNITY): Payer: Self-pay | Admitting: Interventional Radiology

## 2018-07-02 HISTORY — PX: IR REMOVAL TUN ACCESS W/ PORT W/O FL MOD SED: IMG2290

## 2018-07-02 LAB — TYPE AND SCREEN
ABO/RH(D): B POS
Antibody Screen: NEGATIVE
Unit division: 0

## 2018-07-02 LAB — BPAM RBC
Blood Product Expiration Date: 202002142359
ISSUE DATE / TIME: 202001240418
Unit Type and Rh: 7300

## 2018-07-02 LAB — PROTIME-INR
INR: 1.54
Prothrombin Time: 18.3 seconds — ABNORMAL HIGH (ref 11.4–15.2)

## 2018-07-02 LAB — MAGNESIUM: Magnesium: 1.5 mg/dL — ABNORMAL LOW (ref 1.7–2.4)

## 2018-07-02 LAB — COMPREHENSIVE METABOLIC PANEL
ALBUMIN: 2.6 g/dL — AB (ref 3.5–5.0)
ALT: 28 U/L (ref 0–44)
AST: 37 U/L (ref 15–41)
Alkaline Phosphatase: 78 U/L (ref 38–126)
Anion gap: 8 (ref 5–15)
BILIRUBIN TOTAL: 0.8 mg/dL (ref 0.3–1.2)
BUN: 8 mg/dL (ref 8–23)
CO2: 23 mmol/L (ref 22–32)
Calcium: 8.2 mg/dL — ABNORMAL LOW (ref 8.9–10.3)
Chloride: 104 mmol/L (ref 98–111)
Creatinine, Ser: 0.52 mg/dL (ref 0.44–1.00)
GFR calc Af Amer: >60 mL/min (ref >60)
GFR calc non Af Amer: >60 mL/min (ref >60)
Glucose, Bld: 93 mg/dL (ref 70–99)
Potassium: 3 mmol/L — ABNORMAL LOW (ref 3.5–5.1)
Sodium: 135 mmol/L (ref 135–145)
Total Protein: 6.7 g/dL (ref 6.5–8.1)

## 2018-07-02 LAB — CBC
HCT: 31.9 % — ABNORMAL LOW (ref 36.0–46.0)
Hemoglobin: 10.2 g/dL — ABNORMAL LOW (ref 12.0–15.0)
MCH: 21.4 pg — ABNORMAL LOW (ref 26.0–34.0)
MCHC: 32 g/dL (ref 30.0–36.0)
MCV: 66.9 fL — ABNORMAL LOW (ref 80.0–100.0)
Platelets: 188 10*3/uL (ref 150–400)
RBC: 4.77 MIL/uL (ref 3.87–5.11)
RDW: 18.9 % — ABNORMAL HIGH (ref 11.5–15.5)
WBC: 13.6 10*3/uL — ABNORMAL HIGH (ref 4.0–10.5)
nRBC: 0 % (ref 0.0–0.2)

## 2018-07-02 LAB — URINE CULTURE: Culture: NO GROWTH

## 2018-07-02 LAB — MRSA PCR SCREENING: MRSA by PCR: NEGATIVE

## 2018-07-02 LAB — LACTIC ACID, PLASMA: Lactic Acid, Venous: 1 mmol/L (ref 0.5–1.9)

## 2018-07-02 MED ORDER — MIDAZOLAM HCL 2 MG/2ML IJ SOLN
INTRAMUSCULAR | Status: AC | PRN
  Administered 2018-07-02: 1 mg via INTRAVENOUS

## 2018-07-02 MED ORDER — CEFAZOLIN SODIUM-DEXTROSE 2-4 GM/100ML-% IV SOLN
2.0000 g | Freq: Once | INTRAVENOUS | Status: DC
  Filled 2018-07-02: qty 100

## 2018-07-02 MED ORDER — LIDOCAINE-EPINEPHRINE (PF) 2 %-1:200000 IJ SOLN
INTRAMUSCULAR | Status: AC | PRN
  Administered 2018-07-02: 7 mL

## 2018-07-02 MED ORDER — MIDAZOLAM HCL 2 MG/2ML IJ SOLN
INTRAMUSCULAR | Status: AC
  Filled 2018-07-02: qty 4

## 2018-07-02 MED ORDER — FENTANYL CITRATE (PF) 100 MCG/2ML IJ SOLN
INTRAMUSCULAR | Status: AC | PRN
  Administered 2018-07-02: 50 ug via INTRAVENOUS

## 2018-07-02 MED ORDER — FENTANYL CITRATE (PF) 100 MCG/2ML IJ SOLN
INTRAMUSCULAR | Status: AC
  Filled 2018-07-02: qty 2

## 2018-07-02 MED ORDER — HYDRALAZINE HCL 20 MG/ML IJ SOLN
10.0000 mg | Freq: Four times a day (QID) | INTRAMUSCULAR | Status: DC | PRN
  Administered 2018-07-02 – 2018-07-06 (×4): 10 mg via INTRAVENOUS
  Filled 2018-07-02 (×4): qty 1

## 2018-07-02 MED ORDER — CEFAZOLIN SODIUM-DEXTROSE 1-4 GM/50ML-% IV SOLN
1.0000 g | Freq: Once | INTRAVENOUS | Status: AC
  Administered 2018-07-02: 1 g via INTRAVENOUS
  Filled 2018-07-02: qty 50

## 2018-07-02 MED ORDER — FENTANYL CITRATE (PF) 100 MCG/2ML IJ SOLN
12.5000 ug | INTRAMUSCULAR | Status: AC | PRN
  Administered 2018-07-02 (×2): 12.5 ug via INTRAVENOUS
  Filled 2018-07-02 (×2): qty 2

## 2018-07-02 MED ORDER — POTASSIUM CHLORIDE CRYS ER 20 MEQ PO TBCR
40.0000 meq | EXTENDED_RELEASE_TABLET | Freq: Two times a day (BID) | ORAL | Status: AC
  Administered 2018-07-02: 40 meq via ORAL
  Filled 2018-07-02 (×2): qty 2

## 2018-07-02 MED ORDER — LIDOCAINE-EPINEPHRINE (PF) 2 %-1:200000 IJ SOLN
INTRAMUSCULAR | Status: AC
  Filled 2018-07-02: qty 20

## 2018-07-02 MED ORDER — MAGNESIUM SULFATE 2 GM/50ML IV SOLN
2.0000 g | Freq: Once | INTRAVENOUS | Status: AC
  Administered 2018-07-02: 2 g via INTRAVENOUS
  Filled 2018-07-02: qty 50

## 2018-07-02 MED ORDER — HYDROCODONE-ACETAMINOPHEN 5-325 MG PO TABS
1.0000 | ORAL_TABLET | Freq: Four times a day (QID) | ORAL | Status: DC | PRN
  Administered 2018-07-02: 1 via ORAL
  Administered 2018-07-03 – 2018-07-05 (×2): 2 via ORAL
  Administered 2018-07-07: 1 via ORAL
  Filled 2018-07-02 (×2): qty 1
  Filled 2018-07-02 (×4): qty 2

## 2018-07-02 NOTE — Procedures (Signed)
Interventional Radiology Procedure Note  Procedure: Removal of a right IJ approach single lumen PowerPort.    Complications: None Recommendations:  - Ok to shower tomorrow - Do not submerge for 7 days - Routine wound care   Signed,  Amara Manalang S. Damarrion Mimbs, DO   

## 2018-07-02 NOTE — Consult Note (Signed)
Chief Complaint: Patient was seen in consultation today for sepsis.  Referring Physician(s): [name]  Supervising Physician: Corrie Mckusick  Patient Status: [facility] - In-pt  History of Present Illness: Jo Parker is a 73 y.o. female with a past medical history of hypertension, hypercholesterolemia/hyperlipidemia, mitral valve prolapse, HF, COPD, pneumonia, diverticulitis, uterine cancer, carcinomatosis, sleep apnea, arthritis, depression, and anxiety. She presented to [facility] ED 06/30/2018 after being sent from the cancer center for Port-a-cath malfunction (RN reports they cannot draw back blood from Port-a-cath). Upon work-up in the ED, she was found to have a fever and SIRS (she was later found to be septic), and she was admitted for further management.  IR requested by Dr. Skeet Latch for possible Port-a-cath removal. Patient awake and alert laying in bed with no complaints at this time. Accompanied by son at bedside. Denies fever, chills, chest pain, dyspnea, abdominal pain, or headache.   Past Medical History:  Diagnosis Date  . Acute on chronic respiratory failure with hypoxia ([place]) 03/07/2018  . Anxiety and depression   . Aortic aneurysm ([place])    see ov note 03/2017   . Arthritis   . Aspiration pneumonia ([place])   . Carcinomatosis ([place]) 10/2017  . CHF (congestive heart failure) ([name])    diastolic dysfunction  . COPD (chronic obstructive pulmonary disease) ([place]) 10/23/2017  . Depression   . Family history of breast cancer   . Family history of prostate cancer   . Family history of uterine cancer   . History of diverticulitis   . HLD (hyperlipidemia) 10/23/2017  . Hypercholesterolemia   . Hypertension   . Mitral valve prolapse   . Pelvic abscess in female   . Sleep apnea    cpap machine mask and tubing in [place] per patient.  Patient does not have mask or tubing to bring on DOS of 01/27/2018.   Marland Kitchen Uterine cancer ([place])    had partial hysterectomy at 27    Past  Surgical History:  Procedure Laterality Date  . ABDOMINAL HYSTERECTOMY    . cardiac monitor implant    . CEREBRAL ANEURYSM REPAIR    . CERVICAL SPINE SURGERY     bone spurs  . DEBULKING N/A 01/27/2018   Procedure: RADICAL DEBULKING;  Surgeon: [name], MD;  Location: [facility] ORS;  Service: Gynecology;  Laterality: N/A;  . IR CATHETER TUBE CHANGE  06/13/2018  . IR FLUORO GUIDE PORT INSERTION RIGHT  10/14/2017  . IR PARACENTESIS  10/14/2017  . IR RADIOLOGIST EVAL & MGMT  04/26/2018  . IR RADIOLOGIST EVAL & MGMT  05/10/2018  . IR RADIOLOGIST EVAL & MGMT  05/25/2018  . IR US GUIDE VASC ACCESS RIGHT  10/14/2017  . LAPAROTOMY N/A 01/27/2018   Procedure: EXPLORATORY LAPAROTOMY WITH REMOVAL OF BILATERAL TUBES AND OVARIES;  Surgeon: [name], MD;  Location: [facility] ORS;  Service: Gynecology;  Laterality: N/A;  . LAPAROTOMY N/A 01/31/2018   Procedure: EXPLORATORY LAPAROTOMY, ABDOMINAL WASHOUT;  Surgeon: Isabel Caprice, MD;  Location: [facility]S;  Service: Gynecology;  Laterality: N/A;  . OMENTECTOMY N/A 01/27/2018   Procedure: OMENTECTOMY;  Surgeon: [name], MD;  Location: [facility] ORS;  Service: Gynecology;  Laterality: N/A;  . PARTIAL HYSTERECTOMY  1971   "precancer"    Allergies: Oxycodone  Medications: Prior to Admission medications   Medication Sig Start Date End Date Taking? Authorizing Provider  acetaminophen (TYLENOL) 325 MG tablet Take 2 tablets (650 mg total) by mouth every 6 (six) hours as needed for mild pain, fever  or headache. 03/04/18  Yes Cross, Melissa D, NP  carvedilol (COREG) 12.5 MG tablet Take 12.5 mg by mouth 2 (two) times daily with a meal.   Yes [provider]  lidocaine-prilocaine (EMLA) cream Apply 1 application topically as needed. Patient taking differently: Apply 1 application topically as needed (port access).  03/31/18  Yes Gorsuch, Ni, MD  magnesium oxide (MAG-OX) 400 MG tablet Take 400 mg by mouth daily.   Yes [provider]  mirtazapine (REMERON)  30 MG tablet Take 1 tablet (30 mg total) by mouth at bedtime. 05/13/18  Yes [name], MD  Multiple Vitamin (MULTIVITAMIN WITH MINERALS) TABS tablet Take 1 tablet by mouth daily.   Yes [provider]     Family History  Problem Relation Age of Onset  . Endometrial cancer Sister 82       endometrial ca  . Endometrial cancer Paternal Aunt 80  . Breast cancer Paternal Aunt   . Stomach cancer Maternal Grandfather        d. 86s  . Prostate cancer Paternal Uncle   . Breast cancer Cousin        pat first cousin  . Breast cancer Cousin        pat first cousin's daughter  . Lung cancer Father        lung ca  . Heart disease Mother   . Stroke Paternal Grandmother   . Endometrial cancer Cousin        paternal first cousin's daughter  . Breast cancer Other        niece dx less than 66s  . Breast cancer Other        niece, dx under 16    Social History   Socioeconomic History  . Marital status: Married    Spouse name: Not on file  . Number of children: 3  . Years of education: Not on file  . Highest education level: Not on file  Occupational History  . Occupation: retired  Scientific laboratory technician  . Financial resource strain: Not on file  . Food insecurity:    Worry: Not on file    Inability: Not on file  . Transportation needs:    Medical: Not on file    Non-medical: Not on file  Tobacco Use  . Smoking status: Former Smoker    Packs/day: 1.00    Years: 55.00    Pack years: 55.00    Last attempt to quit: 10/06/2017    Years since quitting: 0.7  . Smokeless tobacco: Never Used  Substance and Sexual Activity  . Alcohol use: Never    Frequency: Never  . Drug use: Never  . Sexual activity: Not Currently  Lifestyle  . Physical activity:    Days per week: Not on file    Minutes per session: Not on file  . Stress: Not on file  Relationships  . Social connections:    Talks on phone: Not on file    Gets together: Not on file    Attends religious service: Not on file     Active member of club or organization: Not on file    Attends meetings of clubs or organizations: Not on file    Relationship status: Not on file  Other Topics Concern  . Not on file  Social History Narrative  . Not on file     Review of Systems: A 12 point ROS discussed and pertinent positives are indicated in the HPI above.  All other systems are  negative.  Review of Systems  Constitutional: Negative for chills and fever.  Respiratory: Negative for shortness of breath and wheezing.   Cardiovascular: Negative for chest pain and palpitations.  Gastrointestinal: Negative for abdominal pain.  Neurological: Negative for headaches.  Psychiatric/Behavioral: Negative for behavioral problems and confusion.    Vital Signs: BP 123/73   Pulse 90   Temp 100.2 F (37.9 C) (Oral)   Resp (!) 24   Ht 5' (1.524 m)   Wt 108 lb 14.5 oz (49.4 kg)   SpO2 100%   BMI 21.27 kg/m   Physical Exam Vitals signs and nursing note reviewed.  Constitutional:      General: She is not in acute distress.    Appearance: Normal appearance.  Cardiovascular:     Rate and Rhythm: Normal rate and regular rhythm.     Heart sounds: Normal heart sounds. No murmur.  Pulmonary:     Effort: Pulmonary effort is normal. No respiratory distress.     Breath sounds: Normal breath sounds. No wheezing.  Skin:    General: Skin is warm and dry.     Comments: Port-a-cath site without erythema, drainage, or tenderness.  Neurological:     Mental Status: She is alert and oriented to person, place, and time.  Psychiatric:        Mood and Affect: Mood normal.        Behavior: Behavior normal.        Thought Content: Thought content normal.        Judgment: Judgment normal.      MD Evaluation Airway: WNL Heart: WNL Abdomen: WNL Chest/ Lungs: WNL ASA  Classification: 3 Mallampati/Airway Score: Two   Imaging: Dg Chest 2 View  Result Date: 06/30/2018 CLINICAL DATA:  Fever.  History of uterine cancer. EXAM:  CHEST - 2 VIEW COMPARISON:  March 12, 2018 FINDINGS: The right Port-A-Cath is stable. No pneumothorax. Possible mild pulmonary venous congestion. No overt edema or focal infiltrate. The cardiomediastinal silhouette is unremarkable. No pneumothorax. Probable tiny pleural effusions. IMPRESSION: Tiny pleural effusions and possible mild pulmonary venous congestion. No overt edema or focal infiltrate. Electronically Signed   By: Dorise Bullion III M.D   On: 06/30/2018 15:58   Ct Head Wo Contrast  Result Date: 06/30/2018 CLINICAL DATA:  73 year old female with headache and confusion following fall and head injury 1 week ago. EXAM: CT HEAD WITHOUT CONTRAST TECHNIQUE: Contiguous axial images were obtained from the base of the skull through the vertex without intravenous contrast. COMPARISON:  03/03/2018 and prior CTs FINDINGS: Brain: No evidence of acute infarction, hemorrhage, hydrocephalus, extra-axial collection or mass lesion/mass effect. Atrophy, chronic small-vessel white matter ischemic changes and LEFT aneurysm clip again noted. Vascular: Carotid atherosclerotic calcifications again noted. Skull: No acute abnormality. LEFT temporoparietal craniotomy/craniectomy noted. Sinuses/Orbits: No acute abnormality Other: None IMPRESSION: 1. No evidence of acute intracranial abnormality 2. Atrophy and chronic small-vessel white matter ischemic changes. LEFT-sided aneurysm clip again noted. Electronically Signed   By: Margarette Canada M.D.   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 Electronically Signed   By: Margarette Canada M.D.   On: 07/01/2018 10:19   Result Date: 07/01/2018 CLINICAL DATA:  73 year old female with abdominal and pelvic pain, fever and nausea. Recent LEFT pelvic abscess and percutaneous drain. EXAM: CT ABDOMEN AND PELVIS  WITH CONTRAST TECHNIQUE:  Multidetector CT imaging of the abdomen and pelvis was performed using the standard protocol following bolus administration of intravenous contrast. CONTRAST:  14mL ISOVUE-300 IOPAMIDOL (ISOVUE-300) INJECTION 61% COMPARISON:  06/13/2018 percutaneous drain replacement, 05/10/2018 CT and prior studies FINDINGS: Lower chest: Mild cardiomegaly and new small RIGHT pleural effusion noted. Hepatobiliary: The liver and gallbladder are unremarkable. No biliary dilatation. Pancreas: Unremarkable Spleen: Unremarkable Adrenals/Urinary Tract: The kidneys, adrenal glands and bladder are unremarkable except for a LEFT renal cyst. Stomach/Bowel: There is no evidence of bowel obstruction or definite bowel wall thickening. Colonic diverticulosis noted without definite evidence of diverticulitis. 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 Aortic atherosclerotic calcifications are again noted. Reproductive: Status post hysterectomy. No adnexal masses. Other: Retroperitoneal and mesenteric edema identified. No evidence of ascites or pneumoperitoneum. A percutaneous drainage catheter within the LEFT pelvis is unchanged without residual abscess. Musculoskeletal: No acute or suspicious bony abnormalities identified. IMPRESSION: 1. New small RIGHT pleural effusion and nonspecific retroperitoneal/mesenteric edema. 2. LEFT pelvic drainage catheter again noted - no evidence of residual abscess. 3. 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 4. Cardiomegaly. 5.  Aortic Atherosclerosis (ICD10-I70.0). Electronically Signed: By: Margarette Canada M.D. On: 06/30/2018 19:36   Ir Catheter Tube Change  Result Date: 06/13/2018 INDICATION: 73 year old female with a history of left pelvic sidewall abscess secondary to diverticulitis and a persistent fistulous communication between the catheter in the sigmoid colon. Patient presents for tube exchange and repositioning.  EXAM: IR CATHETER TUBE CHANGE MEDICATIONS: None ANESTHESIA/SEDATION: None COMPLICATIONS: None immediate. PROCEDURE: Informed written consent was obtained from the patient after a thorough discussion of the procedural risks, benefits and alternatives. All questions were addressed. Maximal Sterile Barrier Technique was utilized including caps, mask, sterile gowns, sterile gloves, sterile drape, hand hygiene and skin antiseptic. A timeout was performed prior to the initiation of the procedure. A gentle hand injection of contrast material was performed. There is a persistent small fistulous communication with the adjacent sigmoid colon. The catheter was cut and removed over a short Amplatz wire. A new 10 [place] cook all-purpose drainage catheter was then advanced over the wire and formed. The new catheter was positioned slightly further away from the site of fistulous communication compared to the existing catheter. A gentle hand injection of contrast material was performed. No definite filling of the fistula with a low pressure hand injection. The catheter was then connected to gravity drainage and secured to the skin with an adhesive fixation device. The patient tolerated the procedure well. IMPRESSION: 1. 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 2. Successful exchange for a new French drainage catheter which was positioned slightly farther away from the site of fistulization. PLAN: Return to drain clinic on approximately February 10th (patient request) for repeat drain injection. Electronically Signed   By: Jacqulynn Cadet M.D.   On: 06/13/2018 13:44    Labs:  CBC: Recent Labs    06/30/18 0924 06/30/18 1704 07/01/18 0852 07/02/18 0500  WBC 9.2 12.7* 9.7 13.6*  HGB 11.5* 7.7* 9.2* 10.2*  HCT 36.9 24.9* 29.5* 31.9*  PLT 245 211 175 188    COAGS: Recent Labs    10/08/17 0710  01/31/18 0850  03/05/18 0704 04/18/18 0742 06/30/18 1704 07/02/18 0500    INR 1.22   < > 1.36   < > 1.24 1.24 1.54 1.54  APTT 32  --  36  --   --   --   --   --    < > =  values in this interval not displayed.    BMP: Recent Labs    06/30/18 0924 06/30/18 1704 07/01/18 0852 07/02/18 0500  NA 137 134* 137 135  K 3.8 3.1* 3.3* 3.0*  CL 98 103 108 104  CO2 24 21* 22 23  GLUCOSE 122* 121* 113* 93  BUN 15 14 13 8   CALCIUM 9.8 7.6* 7.8* 8.2*  CREATININE 0.91 0.61 0.69 0.52  GFRNONAA >60 >60 >60 >60  GFRAA >60 >60 >60 >60    LIVER FUNCTION TESTS: Recent Labs    06/15/18 1350 06/30/18 0924 06/30/18 1704 07/02/18 0500  BILITOT 0.4 1.1 1.3* 0.8  AST 17 54* 56* 37  ALT 12 36 34 28  ALKPHOS 90 111 77 78  PROT 8.5* 9.2* 7.2 6.7  ALBUMIN 3.6 3.4* 3.0* 2.6*    TUMOR MARKERS: No results for input(s): AFPTM, CEA, CA199, CHROMGRNA in the last 8760 hours.  Assessment and Plan:  Sepsis. Plan for Port-a-cath removal today with Dr. Earleen Newport. Patient is NPO. INR 1.54 seconds this AM- ok to proceed per Dr. Earleen Newport.  Risks and benefits of image guided port-a-catheter removal was discussed with the patient including, but not limited to bleeding and infection. All of the patient's questions were answered, patient is agreeable to proceed. Consent signed and in chart.   Thank you for this interesting consult.  I greatly enjoyed meeting Jo Parker and look forward to participating in their care.  A copy of this report was sent to the requesting provider on this date.  Electronically Signed: Earley Abide, PA-C 07/02/2018, 11:41 AM   I spent a total of 40 Minutes in face to face in clinical consultation, greater than 50% of which was counseling/coordinating care for sepsis.

## 2018-07-02 NOTE — Progress Notes (Signed)
PROGRESS NOTE  Jo Parker  QQI:[id number] DOB: 09-30-1945 DOA: 06/30/2018 PCP: [name], MD   Brief Narrative: Jo Parker is a 73 y.o. female with medical history significant of peritoneal carcinomatosis, ovarian cancer, malnutrition, presents with weakness.  Patient has been living back at home in [place] for the past few weeks.  When she was visited by her son he noted she was very weak.  Patient admits to a fall about a week ago where she hit her head.  At the oncology office patient had a fever and seemed a little altered.  She had a low mag and was given some IV magnesium and IV fluids and referred over to the ED.  She really did not complain of very much and she is quite stoic.  Patient's last chemo was August per son.  He states she was told she was deemed cancer free.  She is anorexic and eats very little.  She denies any obvious bleeding.  Patient's cancer course is complicated.  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  Patient is status post debulking and pelvic drain placement October 2019.  She is status post total abdominal hysterectomy with adjuvant chemo in August 2019.  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  She was found to have worsening anemia with a hemoglobin of 7.7.  Her baseline appears to be around 10.  She did have a temperature.  She had a mildly elevated troponin but denied any chest pain.  Patient had unremarkable lactic acid.  UA done at the outpatient office showed small leukocytes but no bacteria and no nitrites.  Patient flu  was negative.  Her stool was negative for blood per ED doctor notes that she had very little stool to test.  Chest x-ray shows small effusion.  Assessment &  Plan: Principal Problem:   MSSA bacteremia Active Problems:   Primary peritoneal carcinomatosis ([place])   Severe protein-calorie malnutrition ([name])   COPD (chronic obstructive pulmonary disease) ([name])   Hypomagnesemia   Hypokalemia   Anemia   Fever in adult   Weakness generalized   SIRS (systemic inflammatory response syndrome) ([name])   Sepsis due to Staphylococcus aureus ([place])  Sepsis due to MSSA bacteremia: Per report, had prolonged port access as outpatient so this is likely source. - Narrowed abx to ancef, appreciate ID recommendations. Leukocytosis has developed. - Port (suspected source) removal 1/25. - Echo without vegetation. Will discuss with cardiology Re: TEE.  - Plan to repeat blood cultures following port removal.   Primary peritoneal carcinomatosis: No evidence of recurrence (unremarkable tumor marker), though functional decline is evident. Not undergoing active therapy.  - Appreciate pt's oncologist following the patient. Discussed with Dr. Alvy Bimler in detail this morning who also spoke with patient and family at length.   Generalized weakness, fall at home: No traumatic fractures, etc. on exam.  - Continue PT/OT efforts. Declines SNF placement at this time. Will need 24hr care with son.   Severe protein calorie malnutrition: Due to chronic illness.  - Dietitian consulted, recommending protein supplementation. BMI 18.   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 - IR evaluated drain, placed to gravity drainage, noted ongoing fistula on imaging, advised against removing drain. F/u in 3-4 weeks for injection for evaluation. - Was due to flex sigmoidoscopy with Dr. Marcello Moores as outpatient 1/23. Will defer this for the time being.  Hypomagnesemia: Acute on chronic - Replace again today and recheck daily  Hypokalemia:  - Replace again  today and recheck daily  Multifactorial anemia: Due to thalassemia, malignancy, chemotherapy.  - s/p 1u PRBCs. Continue to monitor and transfuse prn.  DVT prophylaxis: SCDs Code Status: Full Family Communication: Son at bedside Disposition Plan: Continues to have sepsis physiology and baseline poor functional reserve. Will monitor in SDU for another 24 hrs.   Consultants:   Infectious disease  Oncology  Interventional radiology  Procedures:   None  Antimicrobials:  Vancomycin, flagyl, cefepime 1/23  Ancef 1/24 >>    Subjective: Still feels frail, is NPO for procedure later today. Febrile this morning.   Objective: Vitals:   07/02/18 1351 07/02/18 1356 07/02/18 1405 07/02/18 1600  BP: (!) 150/80 139/74 139/73   Pulse: 91 89 93   Resp: (!) 21 20 (!) 25   Temp:    99.7 F (37.6 C)  TempSrc:    Oral  SpO2: 100% 100% 100%   Weight:      Height:        Intake/Output Summary (Last 24 hours) at 07/02/2018 1918 Last data filed at 07/02/2018 0800 Gross per 24 hour  Intake 1392.53 ml  Output 1700 ml  Net -307.47 ml   Filed Weights   06/30/18 1514 07/01/18 2138  Weight: 46.4 kg 49.4 kg   Gen: Pleasant, elderly female in no distress Pulm: Nonlabored breathing room air, tachypnea. +Crackles. CV: Regular tachycardia. No new murmur, rub, or gallop. No JVD, no dependent edema. GI: Abdomen soft, not appreciably tender, non-distended, with normoactive bowel sounds. Drain site c/d/i. Ext: Warm, no deformities Skin: Right upper chest port site accessed, c/d/i, nontender. No new rashes, lesions or ulcers on visualized skin. Neuro: Alert and oriented. No focal neurological deficits. Psych: Judgement and insight appear fair. Mood euthymic & affect congruent. Behavior is appropriate.    Data Reviewed: I have personally reviewed following labs and imaging studies  CBC: Recent Labs  Lab 06/30/18 0924 06/30/18 1704 07/01/18 0852 07/02/18 0500  WBC 9.2 12.7* 9.7 13.6*   NEUTROABS 6.7 10.7*  --   --   HGB 11.5* 7.7* 9.2* 10.2*  HCT 36.9 24.9* 29.5* 31.9*  MCV 64.1* 66.4* 70.2* 66.9*  PLT 245 211 175 578   Basic Metabolic Panel: Recent Labs  Lab 06/30/18 0924 06/30/18 1704 06/30/18 1705 07/01/18 0852 07/02/18 0500  NA 137 134*  --  137 135  K 3.8 3.1*  --  3.3* 3.0*  CL 98 103  --  108 104  CO2 24 21*  --  22 23  GLUCOSE 122* 121*  --  113* 93  BUN 15 14  --  13 8  CREATININE 0.91 0.61  --  0.69 0.52  CALCIUM 9.8 7.6*  --  7.8* 8.2*  MG 1.6*  --  2.5*  --  1.5*   GFR: Estimated Creatinine Clearance: 45 mL/min (by C-G formula based on SCr of 0.52 mg/dL). Liver Function Tests: Recent Labs  Lab 06/30/18 0924 06/30/18 1704 07/02/18 0500  AST 54* 56* 37  ALT 36 34 28  ALKPHOS 111 77 78  BILITOT 1.1 1.3* 0.8  PROT 9.2* 7.2 6.7  ALBUMIN 3.4* 3.0*  2.6*   Coagulation Profile: Recent Labs  Lab 06/30/18 1704 07/02/18 0500  INR 1.54 1.54   Cardiac Enzymes: Recent Labs  Lab 06/30/18 1705 06/30/18 2210 07/01/18 0852  TROPONINI 0.05* 0.05* 0.04*   Anemia Panel: Recent Labs    06/30/18 2210  VITAMINB12 (NOTE)  FOLATE 22.0  FERRITIN 705*  TIBC 144*  IRON 11*   Urine analysis:    Component Value Date/Time   COLORURINE YELLOW 07/01/2018 1457   APPEARANCEUR CLEAR 07/01/2018 1457   LABSPEC 1.030 07/01/2018 1457   PHURINE 5.0 07/01/2018 [place] 07/01/2018 1457   HGBUR NEGATIVE 07/01/2018 1457   BILIRUBINUR NEGATIVE 07/01/2018 1457   [place] 07/01/2018 1457   PROTEINUR NEGATIVE 07/01/2018 1457   NITRITE NEGATIVE 07/01/2018 1457   LEUKOCYTESUR TRACE (A) 07/01/2018 1457   Recent Results (from the past 240 hour(s))  Urine Culture     Status: Abnormal   Collection Time: 06/30/18  2:13 PM  Result Value Ref Range Status   Specimen Description   Final    URINE, CLEAN CATCH Performed at [facility], [place] [address], [place], [place] [address]    Special Requests   Final     NONE Performed at [facility], [place] [address] [place], [place] [address]    Culture MULTIPLE SPECIES PRESENT, SUGGEST RECOLLECTION (A)  Final   Report Status 07/01/2018 FINAL  Final  Culture, Blood     Status: Abnormal (Preliminary result)   Collection Time: 06/30/18  2:39 PM  Result Value Ref Range Status   Specimen Description   Final    BLOOD LEFT ANTECUBITAL Performed at [facility], [place] [address] [place], [place] [address]    Special Requests   Final    BOTTLES DRAWN AEROBIC ONLY Blood Culture results may not be optimal due to an inadequate volume of blood received in culture bottles   Culture  Setup Time   Final    GRAM POSITIVE COCCI AEROBIC BOTTLE ONLY CRITICAL RESULT CALLED TO, READ BACK BY AND VERIFIED WITH: B GREEN PHARMD 0610 07/01/2018 A BROWNING    Culture (A)  Final    STAPHYLOCOCCUS AUREUS SUSCEPTIBILITIES TO FOLLOW Performed at [facility], [place] [address] [place], [place] [address]    Report Status PENDING  Incomplete  Blood Culture ID Panel (Reflexed)     Status: Abnormal   Collection Time: 06/30/18  2:39 PM  Result Value Ref Range Status   Enterococcus species NOT DETECTED NOT DETECTED Final   Listeria monocytogenes NOT DETECTED NOT DETECTED Final   Staphylococcus species DETECTED (A) NOT DETECTED Final    Comment: CRITICAL RESULT CALLED TO, READ BACK BY AND VERIFIED WITH: B GREEN PHARMD 0610 07/01/2018 A BROWNING    Staphylococcus aureus (BCID) DETECTED (A) NOT DETECTED Final    Comment: Methicillin (oxacillin) susceptible Staphylococcus aureus (MSSA). Preferred therapy is anti staphylococcal beta lactam antibiotic (Cefazolin or Nafcillin), unless clinically contraindicated. CRITICAL RESULT CALLED TO, READ BACK BY AND VERIFIED WITH: B GREEN PHARM 0610 07/01/2018 A BROWNING    Methicillin resistance NOT DETECTED NOT DETECTED Final   Streptococcus species NOT DETECTED NOT DETECTED Final    Streptococcus agalactiae NOT DETECTED NOT DETECTED Final   Streptococcus pneumoniae NOT DETECTED NOT DETECTED Final   Streptococcus pyogenes NOT DETECTED NOT DETECTED Final   Acinetobacter baumannii NOT DETECTED NOT DETECTED Final   Enterobacteriaceae species NOT DETECTED NOT DETECTED Final   Enterobacter cloacae complex NOT DETECTED NOT  DETECTED Final   Escherichia coli NOT DETECTED NOT DETECTED Final   Klebsiella oxytoca NOT DETECTED NOT DETECTED Final   Klebsiella pneumoniae NOT DETECTED NOT DETECTED Final   Proteus species NOT DETECTED NOT DETECTED Final   Serratia marcescens NOT DETECTED NOT DETECTED Final   Haemophilus influenzae NOT DETECTED NOT DETECTED Final   Neisseria meningitidis NOT DETECTED NOT DETECTED Final   Pseudomonas aeruginosa NOT DETECTED NOT DETECTED Final   Candida albicans NOT DETECTED NOT DETECTED Final   Candida glabrata NOT DETECTED NOT DETECTED Final   Candida krusei NOT DETECTED NOT DETECTED Final   Candida parapsilosis NOT DETECTED NOT DETECTED Final   Candida tropicalis NOT DETECTED NOT DETECTED Final    Comment: Performed at [facility], [place] [address] [place], [place] [address]  Culture, blood (Routine x 2)     Status: Abnormal (Preliminary result)   Collection Time: 06/30/18  5:15 PM  Result Value Ref Range Status   Specimen Description   Final    BLOOD PORT Performed at [place] [address] [place], [place] [address]    Special Requests   Final    BOTTLES DRAWN AEROBIC AND ANAEROBIC Blood Culture results may not be optimal due to an inadequate volume of blood received in culture bottles Performed at [place] [address], [place], [place] [address]    Culture  Setup Time   Final    GRAM POSITIVE COCCI IN CLUSTERS IN BOTH AEROBIC AND ANAEROBIC BOTTLES CRITICAL RESULT CALLED TO, READ BACK BY AND VERIFIED WITH: Shelda Jakes PHARMD, AT Azzie [place] 07/01/18 BY Rush Landmark Performed at [facility], [place] [address] [place], [place] [address]    Culture STAPHYLOCOCCUS AUREUS (A)  Final   Report Status PENDING  Incomplete  Culture, blood (Routine x 2)     Status: None (Preliminary result)   Collection Time: 06/30/18 10:10 PM  Result Value Ref Range Status   Specimen Description   Final    BLOOD RIGHT WRIST Performed at [place] [address], [place], [place] [address]    Special Requests   Final    BOTTLES DRAWN AEROBIC AND ANAEROBIC Blood Culture adequate volume Performed at [place] [address], [place], [place] [address]    Culture   Final    NO GROWTH 1 DAY Performed at [facility], [place] [address], [place], [place] [address]    Report Status PENDING  Incomplete  Urine culture     Status: None   Collection Time: 07/01/18  2:57 PM  Result Value Ref Range Status   Specimen Description URINE, RANDOM  Final   Special Requests   Final    NONE Performed at [place] [address] [place], [place] [address]    Culture NO GROWTH  Final   Report Status 07/02/2018 FINAL  Final  MRSA PCR Screening     Status: None   Collection Time: 07/01/18 11:26 PM  Result Value Ref Range Status   MRSA by PCR NEGATIVE NEGATIVE Final    Comment:        The GeneXpert MRSA Assay (FDA approved for NASAL specimens only), is one component of a comprehensive MRSA colonization surveillance program. It is not intended to diagnose MRSA infection nor to guide or monitor treatment for MRSA infections. Performed at [facility], [place] [address], [place],  [address]       Radiology  Studies: Ir Removal [employer] W/o [place] Mod Sed  Result Date: 07/02/2018 INDICATION: 73 year old female with bacteremia. She has been referred for port catheter removal. EXAM: REMOVAL RIGHT IJ VEIN PORT-A-CATH MEDICATIONS: 1 g Ancef; The antibiotic was administered within an appropriate time  interval prior to skin puncture. ANESTHESIA/SEDATION: Moderate (conscious) sedation was employed during this procedure. A total of Versed 1.0 mg and Fentanyl 50 mcg was administered intravenously. Moderate Sedation Time: 11 minutes. The patient's level of consciousness and vital signs were monitored continuously by radiology nursing throughout the procedure under my direct supervision. FLUOROSCOPY TIME:  None COMPLICATIONS: None PROCEDURE: Informed consent was obtained from the patient following an explanation of the procedure, risks, benefits and alternatives. The patient understands, agrees and consents for the procedure. All questions were addressed. A time out was performed prior to the initiation of the procedure. The patient was positioned in the operation suite in the supine position on a gantry. The previous scar on the right chest was generously infiltrated with 1% lidocaine for local anesthesia. Infiltration of the skin and subcutaneous tissues surrounding the port was performed. Using sharp and blunt dissection, the port apparatus and subcutaneous catheter were removed in their entirety. The port pocket was then closed with interrupted Vicryl layer and a running subcuticular with 4-0 Monocryl. The skin was sealed with Derma bond. A sterile dressing was placed. The patient tolerated the procedure well and remained hemodynamically stable throughout. No complications were encountered and no significant blood loss was encountered. IMPRESSION: Status post removal of right IJ port catheter. Signed, Dulcy Fanny. Dellia Nims, RPVI [facility] Electronically Signed   By: Corrie Mckusick D.O.   On: 07/02/2018 15:09    Scheduled Meds: . feeding supplement (ENSURE ENLIVE)  237 mL Oral BID BM  . magnesium oxide  400 mg Oral Daily  . mirtazapine  30 mg Oral QHS  . multivitamin with minerals  1 tablet Oral Daily  . potassium chloride  40 mEq Oral BID    Continuous Infusions: . sodium chloride 50 mL/hr at 07/02/18 0907  .  ceFAZolin (ANCEF) IV Stopped (07/02/18 1815)     LOS: 1 day   Time spent: 35 minutes.  Patrecia Pour, MD [facility] [email or web address] Password [facility] 07/02/2018, 7:18 PM

## 2018-07-03 DIAGNOSIS — J449 Chronic obstructive pulmonary disease, unspecified: Secondary | ICD-10-CM

## 2018-07-03 DIAGNOSIS — A4101 Sepsis due to Methicillin susceptible Staphylococcus aureus: Secondary | ICD-10-CM

## 2018-07-03 LAB — BASIC METABOLIC PANEL
Anion gap: 9 (ref 5–15)
BUN: 8 mg/dL (ref 8–23)
CHLORIDE: 104 mmol/L (ref 98–111)
CO2: 24 mmol/L (ref 22–32)
Calcium: 8.3 mg/dL — ABNORMAL LOW (ref 8.9–10.3)
Creatinine, Ser: 0.66 mg/dL (ref 0.44–1.00)
GFR calc Af Amer: >60 mL/min (ref >60)
GFR calc non Af Amer: >60 mL/min (ref >60)
GLUCOSE: 109 mg/dL — AB (ref 70–99)
Potassium: 3.1 mmol/L — ABNORMAL LOW (ref 3.5–5.1)
Sodium: 137 mmol/L (ref 135–145)

## 2018-07-03 LAB — CBC
HCT: 30.8 % — ABNORMAL LOW (ref 36.0–46.0)
Hemoglobin: 9.8 g/dL — ABNORMAL LOW (ref 12.0–15.0)
MCH: 21.1 pg — ABNORMAL LOW (ref 26.0–34.0)
MCHC: 31.8 g/dL (ref 30.0–36.0)
MCV: 66.4 fL — AB (ref 80.0–100.0)
Platelets: 205 10*3/uL (ref 150–400)
RBC: 4.64 MIL/uL (ref 3.87–5.11)
RDW: 19.1 % — ABNORMAL HIGH (ref 11.5–15.5)
WBC: 10.7 10*3/uL — ABNORMAL HIGH (ref 4.0–10.5)
nRBC: 0 % (ref 0.0–0.2)

## 2018-07-03 LAB — CULTURE, BLOOD (SINGLE)

## 2018-07-03 LAB — CULTURE, BLOOD (ROUTINE X 2)

## 2018-07-03 LAB — MAGNESIUM: Magnesium: 1.6 mg/dL — ABNORMAL LOW (ref 1.7–2.4)

## 2018-07-03 MED ORDER — MAGNESIUM SULFATE 2 GM/50ML IV SOLN
2.0000 g | Freq: Once | INTRAVENOUS | Status: AC
  Administered 2018-07-03: 2 g via INTRAVENOUS
  Filled 2018-07-03: qty 50

## 2018-07-03 MED ORDER — POTASSIUM CHLORIDE CRYS ER 20 MEQ PO TBCR
40.0000 meq | EXTENDED_RELEASE_TABLET | Freq: Two times a day (BID) | ORAL | Status: DC
  Administered 2018-07-03: 40 meq via ORAL
  Filled 2018-07-03: qty 2

## 2018-07-03 MED ORDER — POTASSIUM CHLORIDE 20 MEQ/15ML (10%) PO SOLN
40.0000 meq | Freq: Once | ORAL | Status: AC
  Administered 2018-07-03: 40 meq via ORAL
  Filled 2018-07-03: qty 30

## 2018-07-03 MED ORDER — CARVEDILOL 12.5 MG PO TABS
12.5000 mg | ORAL_TABLET | Freq: Two times a day (BID) | ORAL | Status: DC
  Administered 2018-07-03 – 2018-07-07 (×9): 12.5 mg via ORAL
  Filled 2018-07-03 (×9): qty 1

## 2018-07-03 NOTE — Progress Notes (Signed)
PROGRESS NOTE  Jo Parker  PZW:[id number] DOB: 04-Feb-1946 DOA: 06/30/2018 PCP: [name], MD   Brief Narrative: Jo Parker is a 73 y.o. female with medical history significant of peritoneal carcinomatosis, ovarian cancer, malnutrition, presents with weakness.  Patient has been living back at home in [place] for the past few weeks.  When she was visited by her son he noted she was very weak.  Patient admits to a fall about a week ago where she hit her head.  At the oncology office patient had a fever and seemed a little altered.  She had a low mag and was given some IV magnesium and IV fluids and referred over to the ED.  She really did not complain of very much and she is quite stoic.  Patient's last chemo was August per son.  He states she was told she was deemed cancer free.  She is anorexic and eats very little.  She denies any obvious bleeding.  Patient's cancer course is complicated.  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  Patient is status post debulking and pelvic drain placement October 2019.  She is status post total abdominal hysterectomy with adjuvant chemo in August 2019.  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  She was found to have worsening anemia with a hemoglobin of 7.7.  Her baseline appears to be around 10.  She did have a temperature.  She had a mildly elevated troponin but denied any chest pain.  Patient had unremarkable lactic acid.  UA done at the outpatient office showed small leukocytes but no bacteria and no nitrites.  Patient flu  was negative.  Her stool was negative for blood per ED doctor notes that she had very little stool to test.  Chest x-ray shows small effusion.  Assessment &  Plan: Principal Problem:   MSSA bacteremia Active Problems:   Primary peritoneal carcinomatosis ([place])   Severe protein-calorie malnutrition ([name])   COPD (chronic obstructive pulmonary disease) ([name])   Hypomagnesemia   Hypokalemia   Anemia   Fever in adult   Weakness generalized   SIRS (systemic inflammatory response syndrome) ([name])   Sepsis due to Staphylococcus aureus ([place])  Sepsis due to MSSA bacteremia: Per report, had prolonged port access as outpatient so this is likely source. - Narrowed abx to ancef, appreciate ID recommendations. Leukocytosis has developed, continues. - Port (suspected source) removed 1/25. - Echo without vegetation. Discussed with Dr. Johnsie Cancel, requested that patient be made NPO for TEE possibly 1/27.  - Follow up repeat blood cultures 1/26  Primary peritoneal carcinomatosis: No evidence of recurrence (unremarkable tumor marker), though functional decline is evident. Not undergoing active therapy.  - Appreciate pt's oncologist following the patient.  Generalized weakness, fall at home: No traumatic fractures, etc. on exam.  - Continue PT/OT efforts. Declines SNF placement at this time. Will need 24hr care with son.   Severe protein calorie malnutrition: Due to chronic illness.  - Dietitian consulted, recommending protein supplementation. BMI 18.   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 - IR evaluated drain, placed to gravity drainage, noted  ongoing fistula on imaging, advised against removing drain. F/u in 3-4 weeks for injection for evaluation. - Was due to flex sigmoidoscopy with Dr. Marcello Moores as outpatient 1/23. Will defer this for the time being.  Hypomagnesemia: Acute on chronic - Replace again today and recheck daily  Hypokalemia:  - Replace again today and recheck daily  Multifactorial anemia: Due to  thalassemia, malignancy, chemotherapy.  - s/p 1u PRBCs. Continue to monitor and transfuse prn.  DVT prophylaxis: SCDs Code Status: Full Family Communication: Son at bedside Disposition Plan: Continues to have sepsis physiology and baseline poor functional reserve. Will continue to monitor in SDU for another 24 hrs.   Consultants:   Infectious disease  Oncology  Interventional radiology  Procedures:   Port-A-Cath removal 1/25  Antimicrobials:  Vancomycin, flagyl, cefepime 1/23  Ancef 1/24 >>    Subjective: Feeling like she's regaining strength, eating better. Still febrile.   Objective: Vitals:   07/03/18 0900 07/03/18 1000 07/03/18 1100 07/03/18 1200  BP: 140/70 122/61 (!) 101/48   Pulse: (!) 101 87 81   Resp: (!) 23 20 (!) 26   Temp:    (!) 97.5 F (36.4 C)  TempSrc:    Oral  SpO2: 98% 97% 97%   Weight:      Height:        Intake/Output Summary (Last 24 hours) at 07/03/2018 1347 Last data filed at 07/03/2018 1000 Gross per 24 hour  Intake 2080.3 ml  Output 1552 ml  Net 528.3 ml   Filed Weights   06/30/18 1514 07/01/18 2138  Weight: 46.4 kg 49.4 kg   Gen: 73 y.o. female in no distress Pulm: Nonlabored breathing room air. No crackles this AM. CV: Regular tachycardia. No murmur, rub, or gallop. No JVD, no dependent edema. GI: Abdomen soft, non-tender, non-distended, with normoactive bowel sounds.  Ext: Warm, no deformities Skin: No discharge or fluctuance around LLQ drain. No new rashes, lesions or ulcers on visualized skin. Neuro: Alert and oriented. No focal neurological deficits. Psych: Judgement and insight appear fair. Mood euthymic & affect congruent. Behavior is appropriate.     Data Reviewed: I have personally reviewed following labs and imaging studies  CBC: Recent Labs  Lab 06/30/18 0924 06/30/18 1704 07/01/18 0852 07/02/18 0500 07/03/18 0639  WBC 9.2 12.7* 9.7 13.6* 10.7*  NEUTROABS 6.7 10.7*  --   --   --   HGB 11.5* 7.7* 9.2* 10.2*  9.8*  HCT 36.9 24.9* 29.5* 31.9* 30.8*  MCV 64.1* 66.4* 70.2* 66.9* 66.4*  PLT 245 211 175 188 409   Basic Metabolic Panel: Recent Labs  Lab 06/30/18 0924 06/30/18 1704 06/30/18 1705 07/01/18 0852 07/02/18 0500 07/03/18 0639  NA 137 134*  --  137 135 137  K 3.8 3.1*  --  3.3* 3.0* 3.1*  CL 98 103  --  108 104 104  CO2 24 21*  --  22 23 24   GLUCOSE 122* 121*  --  113* 93 109*  BUN 15 14  --  13 8 8   CREATININE 0.91 0.61  --  0.69 0.52 0.66  CALCIUM 9.8 7.6*  --  7.8* 8.2* 8.3*  MG 1.6*  --  2.5*  --  1.5* 1.6*   GFR: Estimated Creatinine Clearance: 45 mL/min (by C-G formula based on SCr of 0.66 mg/dL). Liver Function Tests: Recent Labs  Lab 06/30/18 0924 06/30/18 1704 07/02/18 0500  AST 54* 56* 37  ALT 36 34 28  ALKPHOS 111 77 78  BILITOT 1.1 1.3* 0.8  PROT 9.2* 7.2 6.7  ALBUMIN 3.4* 3.0* 2.6*   Coagulation Profile: Recent Labs  Lab 06/30/18 1704 07/02/18 0500  INR 1.54 1.54   Cardiac Enzymes: Recent Labs  Lab 06/30/18 1705 06/30/18 2210 07/01/18 0852  TROPONINI 0.05* 0.05* 0.04*   Anemia Panel: Recent Labs    06/30/18 2210  VITAMINB12 (NOTE)  FOLATE 22.0  FERRITIN 705*  TIBC 144*  IRON 11*   Urine analysis:    Component Value Date/Time   COLORURINE YELLOW 07/01/2018 [place] 07/01/2018 1457   LABSPEC 1.030 07/01/2018 1457   PHURINE 5.0 07/01/2018 [place] 07/01/2018 1457   HGBUR NEGATIVE 07/01/2018 [place] 07/01/2018 1457   KETONESUR NEGATIVE 07/01/2018 1457   PROTEINUR NEGATIVE 07/01/2018 1457   NITRITE NEGATIVE 07/01/2018 1457   LEUKOCYTESUR TRACE (A) 07/01/2018 1457   Recent Results (from the past 240 hour(s))  Urine Culture     Status: Abnormal   Collection Time: 06/30/18  2:13 PM  Result Value Ref Range Status   Specimen Description   Final    URINE, CLEAN CATCH Performed at [facility], [place] [address] [place], [place] [address]    Special  Requests   Final    NONE Performed at [facility], [place] [address], [address], [place] [address]    Culture MULTIPLE SPECIES PRESENT, SUGGEST RECOLLECTION (A)  Final   Report Status 07/01/2018 FINAL  Final  Culture, Blood     Status: Abnormal   Collection Time: 06/30/18  2:39 PM  Result Value Ref Range Status   Specimen Description   Final    BLOOD LEFT ANTECUBITAL Performed at [facility], [place] [address], [place], [place] [address]    Special Requests   Final    BOTTLES DRAWN AEROBIC ONLY Blood Culture results may not be optimal due to an inadequate volume of blood received in culture bottles   Culture  Setup Time   Final    GRAM POSITIVE COCCI AEROBIC BOTTLE ONLY CRITICAL RESULT CALLED TO, READ BACK BY AND VERIFIED WITH: B GREEN PHARMD [id number] 07/01/2018 A BROWNING Performed at [facility], [place] [address], [place], [place] [address]    Culture STAPHYLOCOCCUS AUREUS (A)  Final   Report Status 07/03/2018 FINAL  Final   Organism ID, Bacteria STAPHYLOCOCCUS AUREUS  Final      Susceptibility   Staphylococcus aureus - MIC*    CIPROFLOXACIN <=0.5 SENSITIVE Sensitive     ERYTHROMYCIN RESISTANT Resistant     GENTAMICIN <=0.5 SENSITIVE Sensitive     OXACILLIN 0.5 SENSITIVE Sensitive     TETRACYCLINE <=1 SENSITIVE Sensitive     VANCOMYCIN <=0.5 SENSITIVE Sensitive     TRIMETH/SULFA <=10 SENSITIVE Sensitive     CLINDAMYCIN RESISTANT Resistant     RIFAMPIN <=0.5 SENSITIVE Sensitive     Inducible Clindamycin POSITIVE Resistant     * STAPHYLOCOCCUS AUREUS  Blood Culture ID Panel (Reflexed)     Status: Abnormal   Collection Time: 06/30/18  2:39 PM  Result Value Ref Range Status   Enterococcus species NOT DETECTED NOT DETECTED Final   Listeria monocytogenes NOT DETECTED NOT DETECTED Final   Staphylococcus species DETECTED (A) NOT DETECTED Final    Comment: CRITICAL RESULT CALLED TO, READ BACK BY AND VERIFIED WITH: B GREEN PHARMD 0610  07/01/2018 A BROWNING    Staphylococcus aureus (BCID) DETECTED (A) NOT DETECTED Final    Comment: Methicillin (oxacillin) susceptible Staphylococcus  aureus (MSSA). Preferred therapy is anti staphylococcal beta lactam antibiotic (Cefazolin or Nafcillin), unless clinically contraindicated. CRITICAL RESULT CALLED TO, READ BACK BY AND VERIFIED WITH: B GREEN PHARM 0610 07/01/2018 A BROWNING    Methicillin resistance NOT DETECTED NOT DETECTED Final   Streptococcus species NOT DETECTED NOT DETECTED Final   Streptococcus agalactiae NOT DETECTED NOT DETECTED Final   Streptococcus pneumoniae NOT DETECTED NOT DETECTED Final   Streptococcus pyogenes NOT DETECTED NOT DETECTED Final   Acinetobacter baumannii NOT DETECTED NOT DETECTED Final   Enterobacteriaceae species NOT DETECTED NOT DETECTED Final   Enterobacter cloacae complex NOT DETECTED NOT DETECTED Final   Escherichia coli NOT DETECTED NOT DETECTED Final   Klebsiella oxytoca NOT DETECTED NOT DETECTED Final   Klebsiella pneumoniae NOT DETECTED NOT DETECTED Final   Proteus species NOT DETECTED NOT DETECTED Final   Serratia marcescens NOT DETECTED NOT DETECTED Final   Haemophilus influenzae NOT DETECTED NOT DETECTED Final   Neisseria meningitidis NOT DETECTED NOT DETECTED Final   Pseudomonas aeruginosa NOT DETECTED NOT DETECTED Final   Candida albicans NOT DETECTED NOT DETECTED Final   Candida glabrata NOT DETECTED NOT DETECTED Final   Candida krusei NOT DETECTED NOT DETECTED Final   Candida parapsilosis NOT DETECTED NOT DETECTED Final   Candida tropicalis NOT DETECTED NOT DETECTED Final    Comment: Performed at [facility], [place] [address] [place], [place] [address]  Culture, blood (Routine x 2)     Status: Abnormal   Collection Time: 06/30/18  5:15 PM  Result Value Ref Range Status   Specimen Description   Final    BLOOD PORT Performed at [place] [address] [place], [place] [address]    Special Requests    Final    BOTTLES DRAWN AEROBIC AND ANAEROBIC Blood Culture results may not be optimal due to an inadequate volume of blood received in culture bottles Performed at [place] [address] [place], [place] [address]    Culture  Setup Time   Final    GRAM POSITIVE COCCI IN CLUSTERS IN BOTH AEROBIC AND ANAEROBIC BOTTLES CRITICAL RESULT CALLED TO, READ BACK BY AND VERIFIED WITH: Shelda Jakes PHARMD, AT 0820 07/01/18 BY D. VANHOOK    Culture (A)  Final    STAPHYLOCOCCUS AUREUS SUSCEPTIBILITIES PERFORMED ON PREVIOUS CULTURE WITHIN THE LAST 5 DAYS. Performed at [facility], [place] [address], [place], [place] [address]    Report Status 07/03/2018 FINAL  Final  Culture, blood (Routine x 2)     Status: None (Preliminary result)   Collection Time: 06/30/18 10:10 PM  Result Value Ref Range Status   Specimen Description   Final    BLOOD RIGHT WRIST Performed at [place] [address], [place], [place] [address]    Special Requests   Final    BOTTLES DRAWN AEROBIC AND ANAEROBIC Blood Culture adequate volume Performed at [place] [address] [place], [place] [address]    Culture   Final    NO GROWTH 2 DAYS Performed at [place] [address] [place], [place] [address]    Report Status PENDING  Incomplete  Urine culture     Status: None   Collection Time: 07/01/18  2:57 PM  Result Value Ref Range Status   Specimen Description URINE, RANDOM  Final   Special Requests   Final    NONE Performed at [place] [address]  [place], [place] [address]    Culture NO GROWTH  Final   Report Status 07/02/2018 FINAL  Final  MRSA PCR Screening     Status: None   Collection Time: 07/01/18 11:26 PM  Result Value Ref Range Status   MRSA by PCR NEGATIVE NEGATIVE Final    Comment:        The GeneXpert MRSA Assay (FDA approved for NASAL specimens only), is one component of  a comprehensive MRSA colonization surveillance program. It is not intended to diagnose MRSA infection nor to guide or monitor treatment for MRSA infections. Performed at [facility], [place] [address], [place],  [address]       Radiology Studies: Ir Removal [facility] Access W/ [place] W/o [place] Mod Sed  Result Date: 07/02/2018 INDICATION: 73 year old female with bacteremia. She has been referred for port catheter removal. EXAM: REMOVAL RIGHT IJ VEIN PORT-A-CATH MEDICATIONS: 1 g Ancef; The antibiotic was administered within an appropriate time interval prior to skin puncture. ANESTHESIA/SEDATION: Moderate (conscious) sedation was employed during this procedure. A total of Versed 1.0 mg and Fentanyl 50 mcg was administered intravenously. Moderate Sedation Time: 11 minutes. The patient's level of consciousness and vital signs were monitored continuously by radiology nursing throughout the procedure under my direct supervision. FLUOROSCOPY TIME:  None COMPLICATIONS: None PROCEDURE: Informed consent was obtained from the patient following an explanation of the procedure, risks, benefits and alternatives. The patient understands, agrees and consents for the procedure. All questions were addressed. A time out was performed prior to the initiation of the procedure. The patient was positioned in the operation suite in the supine position on a gantry. The previous scar on the right chest was generously infiltrated with 1% lidocaine for local anesthesia. Infiltration of the skin and subcutaneous tissues surrounding the port was performed. Using sharp and blunt dissection, the port apparatus and subcutaneous catheter were removed in their entirety. The port pocket was then closed with interrupted Vicryl layer and a running subcuticular with 4-0 Monocryl. The skin was sealed with Derma bond. A sterile dressing was placed. The patient tolerated the procedure well and remained hemodynamically stable  throughout. No complications were encountered and no significant blood loss was encountered. IMPRESSION: Status post removal of right IJ port catheter. Signed, Dulcy Fanny. Dellia Nims, RPVI [facility] Radiology Electronically Signed   By: Corrie Mckusick D.O.   On: 07/02/2018 15:09    Scheduled Meds: . carvedilol  12.5 mg Oral BID WC  . feeding supplement (ENSURE ENLIVE)  237 mL Oral BID BM  . magnesium oxide  400 mg Oral Daily  . mirtazapine  30 mg Oral QHS  . multivitamin with minerals  1 tablet Oral Daily  . potassium chloride  40 mEq Oral Once   Continuous Infusions: . sodium chloride 50 mL/hr at 07/03/18 1000  .  ceFAZolin (ANCEF) IV 2 g (07/03/18 1135)     LOS: 2 days   Time spent: 35 minutes.  Patrecia Pour, MD [facility] [email or web address] Password [email or web address] 07/03/2018, 1:47 PM

## 2018-07-03 NOTE — Progress Notes (Signed)
[facility] for Infectious Disease    Date of Admission:  06/30/2018   Total days of antibiotics 4   ID: Jo Parker is a 73 y.o. female with  Principal Problem:   MSSA bacteremia Active Problems:   Primary peritoneal carcinomatosis ([place])   Severe protein-calorie malnutrition ([name])   COPD (chronic obstructive pulmonary disease) ([name])   Hypomagnesemia   Hypokalemia   Anemia   Fever in adult   Weakness generalized   SIRS (systemic inflammatory response syndrome) ([place])   Sepsis due to Staphylococcus aureus ([name])    Subjective: Had line removed yesterday and feeling better today. Had temp of 102F yesterday, also still had nightsweats/chills yesterday but has remained afebrile and no chills/nightsweats today  Medications:  . carvedilol  12.5 mg Oral BID WC  . feeding supplement (ENSURE ENLIVE)  237 mL Oral BID BM  . magnesium oxide  400 mg Oral Daily  . mirtazapine  30 mg Oral QHS  . multivitamin with minerals  1 tablet Oral Daily    Objective: Vital signs in last 24 hours: Temp:  [97.5 F (36.4 C)-102.5 F (39.2 C)] 97.6 F (36.4 C) (01/26 1600) Pulse Rate:  [79-113] 86 (01/26 1700) Resp:  [12-28] 22 (01/26 1700) BP: (101-187)/(45-107) 130/92 (01/26 1700) SpO2:  [96 %-99 %] 99 % (01/26 1700) Physical Exam  Constitutional:  oriented to person, place, and time. appears well-developed and well-nourished. No distress.  HENT: [place]/AT, PERRLA, no scleral icterus Mouth/Throat: Oropharynx is clear and moist. No oropharyngeal exudate.  Cardiovascular: Normal rate, regular rhythm and normal heart sounds. Exam reveals no gallop and no friction rub.  No murmur heard.  Pulmonary/Chest: Effort normal and breath sounds normal. No respiratory distress.  has no wheezes.  Neck = supple, no nuchal rigidity Abdominal: Soft. Bowel sounds are normal.  exhibits no distension. There is no tenderness.  Lymphadenopathy: no cervical adenopathy. No axillary adenopathy Neurological:  alert and oriented to person, place, and time.  Skin: Skin is warm and dry. No rash noted. No erythema.  Psychiatric: a normal mood and affect.  behavior is normal.   Lab Results Recent Labs    07/02/18 0500 07/03/18 0639  WBC 13.6* 10.7*  HGB 10.2* 9.8*  HCT 31.9* 30.8*  NA 135 137  K 3.0* 3.1*  CL 104 104  CO2 23 24  BUN 8 8  CREATININE 0.52 0.66   Liver Panel Recent Labs    07/02/18 0500  PROT 6.7  ALBUMIN 2.6*  AST 37  ALT 28  ALKPHOS 78  BILITOT 0.8   TTE: Study Conclusions  - Left ventricle: The cavity size was normal. Wall thickness was   increased in a pattern of mild LVH. There was mild focal basal   hypertrophy of the septum. Systolic function was normal. The   estimated ejection fraction was in the range of 60% to 65%. Wall   motion was normal; there were no regional wall motion   abnormalities. Features are consistent with a pseudonormal left   ventricular filling pattern, with concomitant abnormal relaxation   and increased filling pressure (grade 2 diastolic dysfunction).  Impressions:  - Normal LV systolic function; mild LVH; moderate diastolic   dysfunction; mild TR. Microbiology: 1/26 blood cx pending 1/23 blood cx MSSA Studies/Results: Ir Removal [employer] W/o [place] Mod Sed  Result Date: 07/02/2018 INDICATION: 73 year old female with bacteremia. She has been referred for port catheter removal. EXAM: REMOVAL RIGHT IJ VEIN PORT-A-CATH MEDICATIONS: 1 g Ancef; The antibiotic  was administered within an appropriate time interval prior to skin puncture. ANESTHESIA/SEDATION: Moderate (conscious) sedation was employed during this procedure. A total of Versed 1.0 mg and Fentanyl 50 mcg was administered intravenously. Moderate Sedation Time: 11 minutes. The patient's level of consciousness and vital signs were monitored continuously by radiology nursing throughout the procedure under my direct supervision. FLUOROSCOPY TIME:  None COMPLICATIONS: None  PROCEDURE: Informed consent was obtained from the patient following an explanation of the procedure, risks, benefits and alternatives. The patient understands, agrees and consents for the procedure. All questions were addressed. A time out was performed prior to the initiation of the procedure. The patient was positioned in the operation suite in the supine position on a gantry. The previous scar on the right chest was generously infiltrated with 1% lidocaine for local anesthesia. Infiltration of the skin and subcutaneous tissues surrounding the port was performed. Using sharp and blunt dissection, the port apparatus and subcutaneous catheter were removed in their entirety. The port pocket was then closed with interrupted Vicryl layer and a running subcuticular with 4-0 Monocryl. The skin was sealed with Derma bond. A sterile dressing was placed. The patient tolerated the procedure well and remained hemodynamically stable throughout. No complications were encountered and no significant blood loss was encountered. IMPRESSION: Status post removal of right IJ port catheter. Signed, Dulcy Fanny. Dellia Nims, RPVI [facility] Radiology Electronically Signed   By: Corrie Mckusick D.O.   On: 07/02/2018 15:09   Assessment/Plan: 73yo F with hx of uterine ca c/b peritoneal carcinamatosis admitted with SIRS found to have MSSA bacteremia in the setting of having portacath, removed on 1/26. Day 1 of improvement today. No longer febrile  - continue on cefazolin 2gm IV q8hr - recommend to get TEE to decide if need 2 wk vs. 6 wk of IV abtx - would wait to place any central line until we can document clearance of bacteremia.  [facility] for Infectious Diseases Cell: [phone] Pager: [phone]  07/03/2018, 6:31 PM

## 2018-07-04 ENCOUNTER — Encounter (HOSPITAL_COMMUNITY): Payer: Self-pay | Admitting: *Deleted

## 2018-07-04 ENCOUNTER — Inpatient Hospital Stay (HOSPITAL_COMMUNITY): Payer: Medicare PPO | Admitting: Certified Registered Nurse Anesthetist

## 2018-07-04 ENCOUNTER — Encounter (HOSPITAL_COMMUNITY)
Admission: EM | Disposition: A | Discharge status: Home Health | Payer: Self-pay | Source: Home / Self Care | Attending: Family Medicine

## 2018-07-04 ENCOUNTER — Ambulatory Visit (HOSPITAL_COMMUNITY): Payer: Medicare PPO

## 2018-07-04 DIAGNOSIS — R531 Weakness: Secondary | ICD-10-CM

## 2018-07-04 DIAGNOSIS — R7881 Bacteremia: Secondary | ICD-10-CM

## 2018-07-04 HISTORY — PX: TEE WITHOUT CARDIOVERSION: SHX5443

## 2018-07-04 LAB — CBC
HCT: 29.8 % — ABNORMAL LOW (ref 36.0–46.0)
Hemoglobin: 9.2 g/dL — ABNORMAL LOW (ref 12.0–15.0)
MCH: 21.3 pg — ABNORMAL LOW (ref 26.0–34.0)
MCHC: 30.9 g/dL (ref 30.0–36.0)
MCV: 69 fL — ABNORMAL LOW (ref 80.0–100.0)
NRBC: 0 % (ref 0.0–0.2)
Platelets: 206 10*3/uL (ref 150–400)
RBC: 4.32 MIL/uL (ref 3.87–5.11)
RDW: 19.8 % — ABNORMAL HIGH (ref 11.5–15.5)
WBC: 7.7 10*3/uL (ref 4.0–10.5)

## 2018-07-04 LAB — BASIC METABOLIC PANEL
Anion gap: 7 (ref 5–15)
BUN: 9 mg/dL (ref 8–23)
CO2: 24 mmol/L (ref 22–32)
Calcium: 8.4 mg/dL — ABNORMAL LOW (ref 8.9–10.3)
Chloride: 105 mmol/L (ref 98–111)
Creatinine, Ser: 0.59 mg/dL (ref 0.44–1.00)
GFR calc non Af Amer: >60 mL/min (ref >60)
Glucose, Bld: 109 mg/dL — ABNORMAL HIGH (ref 70–99)
Potassium: 4.2 mmol/L (ref 3.5–5.1)
Sodium: 136 mmol/L (ref 135–145)

## 2018-07-04 LAB — MAGNESIUM: Magnesium: 1.8 mg/dL (ref 1.7–2.4)

## 2018-07-04 SURGERY — ECHOCARDIOGRAM, TRANSESOPHAGEAL
Anesthesia: Monitor Anesthesia Care

## 2018-07-04 MED ORDER — LACTATED RINGERS IV SOLN
INTRAVENOUS | Status: DC | PRN
  Administered 2018-07-04: 14:00:00 via INTRAVENOUS

## 2018-07-04 MED ORDER — PROPOFOL 10 MG/ML IV BOLUS
INTRAVENOUS | Status: DC | PRN
  Administered 2018-07-04: 20 mg via INTRAVENOUS

## 2018-07-04 MED ORDER — LIDOCAINE 2% (20 MG/ML) 5 ML SYRINGE
INTRAMUSCULAR | Status: DC | PRN
  Administered 2018-07-04: 40 mg via INTRAVENOUS

## 2018-07-04 MED ORDER — SODIUM CHLORIDE 0.9 % IV SOLN: INTRAVENOUS | Status: DC

## 2018-07-04 MED ORDER — BOOST PLUS PO LIQD
237.0000 mL | Freq: Three times a day (TID) | ORAL | Status: DC
  Administered 2018-07-04 – 2018-07-07 (×5): 237 mL via ORAL
  Filled 2018-07-04 (×10): qty 237

## 2018-07-04 MED ORDER — PROPOFOL 500 MG/50ML IV EMUL
INTRAVENOUS | Status: DC | PRN
  Administered 2018-07-04: 100 ug/kg/min via INTRAVENOUS

## 2018-07-04 NOTE — Progress Notes (Signed)
These preliminary result these preliminary results were noted.  Awaiting final report.

## 2018-07-04 NOTE — Progress Notes (Signed)
Patient has been scheduled for a TEE @ [facility] @ 1400. Patient needs to arrive @ [facility] @ 1230. Spoke with Alroy Dust, RN about setting up [employer].

## 2018-07-04 NOTE — Progress Notes (Signed)
Jo Parker   DOB:[birth year]   YS#:[id number]    Assessment & Plan:  Primary peritoneal carcinomatosis [facility]) I have reviewed her CT imaging which show no signs of definitive cancer recurrence. Previously noted abscess collection had disappeared.  Her recent tumor marker was unremarkable.  Continue supportive care  History of pelvic abscess/fistula with drain placement Will defer to IR for management  MSSA bacteremia/sepsis Her port was removed. Repeat blood cultures are negative so far Appreciate ID consult  Hypomagnesemia/hypokalemia, resolved She has chronic hypomagnesemia, likely due to poor oral intake and chronic GI loss She will continue IV magnesium and IV potassium replacement therapy as needed  Poor nutrition, recent weight loss Recommend dietitian to see  Anemia due to antineoplastic chemotherapy and background of thalassemia She has multifactorial anemia. She had received blood transfusion and her hemoglobin is stable.  Recent fall, weakness Recommend PT OT to evaluate while she is in the hospital.  She might need skilled nursing facility placement upon discharge  Goals of care discussion I am very worried about the patient.  The patient wants to live at home in [place] but I am not able to take care of her when she is far away.  Her son, [name], is present throughout the conversation.  We discussed goals of care.  The patient appears to want aggressive supportive care.  She is aware that she might not be able to return home to [place] upon discharge  Discharge planning Hopefully next few days  [name], MD 07/04/2018  4:24 PM   Subjective:  Events over the weekend is noted. She has returned to the floor after TEE. She feels well. No further reported fever or chills. No pain  Objective:  Vitals:   07/04/18 1455 07/04/18 1505  BP: 132/60 129/61  Pulse: 78 79  Resp: (!) 24 (!) 21  Temp:    SpO2: 98% 97%     Intake/Output Summary (Last 24  hours) at 07/04/2018 1624 Last data filed at 07/04/2018 1423 Gross per 24 hour  Intake 1369.74 ml  Output 1900 ml  Net -530.26 ml    GENERAL:alert, no distress and comfortable SKIN: skin color, texture, turgor are normal, no rashes or significant lesions EYES: normal, Conjunctiva are pink and non-injected, sclera clear OROPHARYNX:no exudate, no erythema and lips, buccal mucosa, and tongue normal  NECK: supple, thyroid normal size, non-tender, without nodularity LYMPH:  no palpable lymphadenopathy in the cervical, axillary or inguinal LUNGS: clear to auscultation and percussion with normal breathing effort HEART: regular rate & rhythm and no murmurs and no lower extremity edema ABDOMEN:abdomen soft, non-tender and normal bowel sounds Musculoskeletal:no cyanosis of digits and no clubbing  NEURO: alert & oriented x 3 with fluent speech, no focal motor/sensory deficits   Labs:  Lab Results  Component Value Date   WBC 7.7 07/04/2018   HGB 9.2 (L) 07/04/2018   HCT 29.8 (L) 07/04/2018   MCV 69.0 (L) 07/04/2018   PLT 206 07/04/2018   NEUTROABS 10.7 (H) 06/30/2018    Lab Results  Component Value Date   NA 136 07/04/2018   K 4.2 07/04/2018   CL 105 07/04/2018   CO2 24 07/04/2018    Studies:  No results found.

## 2018-07-04 NOTE — Op Note (Signed)
INDICATIONS: infective endocarditis  PROCEDURE:   Informed consent was obtained prior to the procedure. The risks, benefits and alternatives for the procedure were discussed and the patient comprehended these risks.  Risks include, but are not limited to, cough, sore throat, vomiting, nausea, somnolence, esophageal and stomach trauma or perforation, bleeding, low blood pressure, aspiration, pneumonia, infection, trauma to the teeth and death.    After a procedural time-out, the oropharynx was anesthetized with 20% benzocaine spray.   During this procedure the patient was administered IV propofol by Anesthesiology.  The transesophageal probe was inserted in the esophagus and stomach without difficulty and multiple views were obtained.  The patient was kept under observation until the patient left the procedure room.  The patient left the procedure room in stable condition.   Agitated microbubble saline contrast was not administered.  COMPLICATIONS:    There were no immediate complications.  FINDINGS:  No evidence for endocarditis. Normal study.    RECOMMENDATIONS:     "Underfilled" left ventricle suggests hypovolemia.  Time Spent Directly with the Patient:  30 minutes   Elice Crigger 07/04/2018, 2:18 PM

## 2018-07-04 NOTE — Progress Notes (Signed)
PT Cancellation Note  Patient Details Name: Jo Parker MRN: [id number] DOB: [birth year]   Cancelled Treatment:    Reason Eval/Treat Not Completed: Medical issues which prohibited therapy;Patient to go for TEE today.  Claretha Cooper 07/04/2018, 8:07 AM  [place] Pager [phone] Office [phone]

## 2018-07-04 NOTE — Anesthesia Preprocedure Evaluation (Signed)
Anesthesia Evaluation  Patient identified by MRN, date of birth, ID band Patient confused    Reviewed: Allergy & Precautions, NPO status , Patient's Chart, lab work & pertinent test results  History of Anesthesia Complications Negative for: history of anesthetic complications  Airway Mallampati: III  TM Distance: >3 FB   Mouth opening: Limited Mouth Opening  Dental  (+) Dental Advisory Given   Pulmonary sleep apnea , COPD, former smoker,    breath sounds clear to auscultation       Cardiovascular hypertension, Pt. on medications and Pt. on home beta blockers + Peripheral Vascular Disease  + Valvular Problems/Murmurs MVP  Rhythm:Regular Rate:Tachycardia   '19 TTE - Moderate concentric LVH. EF 65% to 70%. Grade 1 diastolic dysfunction. There was trivial MR.    Neuro/Psych PSYCHIATRIC DISORDERS Anxiety Depression  Postoperative confusion since previous operation negative neurological ROS     GI/Hepatic negative GI ROS, Neg liver ROS,   Endo/Other  negative endocrine ROS  Renal/GU negative Renal ROS  negative genitourinary   Musculoskeletal  (+) Arthritis ,   Abdominal   Peds  Hematology  (+) Blood dyscrasia, anemia ,  Thrombocytopenia Leukopenia    Anesthesia Other Findings   Reproductive/Obstetrics  Uterine cancer s/p partial hysterectomy                              Anesthesia Physical  Anesthesia Plan  ASA: III  Anesthesia Plan: MAC   Post-op Pain Management:    Induction: Intravenous, Rapid sequence and Cricoid pressure planned  PONV Risk Score and Plan: 3 and Treatment may vary due to age or medical condition  Airway Management Planned: Nasal Cannula, Simple Face Mask and Mask  Additional Equipment: None  Intra-op Plan:   Post-operative Plan: Possible Post-op intubation/ventilation  Informed Consent: I have reviewed the patients History and Physical, chart, labs  and discussed the procedure including the risks, benefits and alternatives for the proposed anesthesia with the patient or authorized representative who has indicated his/her understanding and acceptance.     Dental advisory given  Plan Discussed with: CRNA, Anesthesiologist and Surgeon  Anesthesia Plan Comments:         Anesthesia Quick Evaluation

## 2018-07-04 NOTE — Progress Notes (Signed)
    [facility] has been requested to perform a transesophageal echocardiogram on 01/27 at 2 pm by Dr Sallyanne Kuster for bacteremia.  After careful review of history and examination, the risks and benefits of transesophageal echocardiogram have been explained including risks of esophageal damage, perforation (1:10,000 risk), bleeding, pharyngeal hematoma as well as other potential complications associated with conscious sedation including aspiration, arrhythmia, respiratory failure and death. Alternatives to treatment were discussed, questions were answered. Patient is willing to proceed.   Rosaria Ferries, PA-C 07/04/2018 8:36 AM

## 2018-07-04 NOTE — Progress Notes (Signed)
PROGRESS NOTE  Jo Parker  XLK:[id number] DOB: 10-May-1946 DOA: 06/30/2018 PCP: [name], MD   Brief Narrative: Jo Parker is a 73 y.o. female with medical history significant of peritoneal carcinomatosis, ovarian cancer, malnutrition, presents with weakness.  Patient has been living back at home in [place] for the past few weeks.  When she was visited by her son he noted she was very weak.  Patient admits to a fall about a week ago where she hit her head.  At the oncology office patient had a fever and seemed a little altered.  She had a low mag and was given some IV magnesium and IV fluids and referred over to the ED.  She really did not complain of very much and she is quite stoic.  Patient's last chemo was August per son.  He states she was told she was deemed cancer free.  She is anorexic and eats very little.  She denies any obvious bleeding.  Patient's cancer course is complicated.  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  Patient is status post debulking and pelvic drain placement October 2019.  She is status post total abdominal hysterectomy with adjuvant chemo in August 2019.  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  She was found to have worsening anemia with a hemoglobin of 7.7.  Her baseline appears to be around 10.  She did have a temperature.  She had a mildly elevated troponin but denied any chest pain.  Patient had unremarkable lactic acid.  UA done at the outpatient office showed small leukocytes but no bacteria and no nitrites.  Patient flu  was negative.  Her stool was negative for blood per ED doctor notes that she had very little stool to test.  Chest x-ray showed small effusion.  Assessment &  Plan: Principal Problem:   MSSA bacteremia Active Problems:   Primary peritoneal carcinomatosis ([place])   Severe protein-calorie malnutrition ([name])   COPD (chronic obstructive pulmonary disease) ([name])   Hypomagnesemia   Hypokalemia   Anemia   Fever in adult   Weakness generalized   SIRS (systemic inflammatory response syndrome) ([name])   Sepsis due to Staphylococcus aureus ([place])  Sepsis due to MSSA bacteremia: Per report, had prolonged port access as outpatient so this is likely source. Port (suspected source) removed 1/25. No endocarditis on TEE 1/27.  - Narrowed abx to ancef, appreciate ID recommendations.  - Follow up repeat blood cultures 1/26. Plan to place access per ID once clearance documented. Continues to have fevers.  Primary peritoneal carcinomatosis: No evidence of recurrence (unremarkable tumor marker), though functional decline is evident. Not undergoing active therapy.  - Appreciate pt's oncologist following the patient.  Generalized weakness, fall at home: No traumatic fractures, etc. on exam.  - Continue PT/OT efforts. Declines SNF placement at this time. Will need 24hr care with son.   Severe protein calorie malnutrition: Due to chronic illness.  - Dietitian consulted, recommending protein supplementation. BMI 18.   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 - IR evaluated drain, placed to gravity drainage, noted ongoing fistula on imaging,  advised against removing drain. F/u in 3-4 weeks for injection for evaluation. - Was due to flex sigmoidoscopy with Dr. Marcello Moores as outpatient 1/23. Will defer this for the time being.  Hypomagnesemia: Acute on chronic - Replaced, recheck daily  Hypokalemia:  - Replaced and resolved. Monitor  Multifactorial anemia: Due to thalassemia, malignancy, chemotherapy.  - s/p 1u PRBCs. Continue to  monitor and transfuse prn.  DVT prophylaxis: SCDs Code Status: Full Family Communication: Son at bedside Disposition Plan: Hemodynamics are improved. Ok to transfer to telemetry.   Consultants:   Infectious disease  Oncology  Interventional radiology  Procedures:   Port-A-Cath removal 1/25  TEE: No evidence of vegetation  Antimicrobials:  Vancomycin, flagyl, cefepime 1/23  Ancef 1/24 >>    Subjective: 101F this morning but denies feeling unwell, feels she's getting more strength. Son at bedside reports she ate a lot of dinner and much of lunch yesterday (improvement).   Objective: Vitals:   07/04/18 1435 07/04/18 1445 07/04/18 1455 07/04/18 1505  BP: (!) 122/56 139/61 132/60 129/61  Pulse: 80 79 78 79  Resp: (!) 22 (!) 24 (!) 24 (!) 21  Temp:      TempSrc:      SpO2: 99% 99% 98% 97%  Weight:      Height:        Intake/Output Summary (Last 24 hours) at 07/04/2018 1540 Last data filed at 07/04/2018 1423 Gross per 24 hour  Intake 1369.74 ml  Output 1900 ml  Net -530.26 ml   Filed Weights   06/30/18 1514 07/01/18 2138  Weight: 46.4 kg 49.4 kg   Gen: 73 y.o. female in no distress Pulm: Nonlabored breathing, tachypneic. Clear. CV: Regular rate and rhythm. No murmur, rub, or gallop. No JVD, no dependent edema. GI: Abdomen soft, non-tender, non-distended, with normoactive bowel sounds.  Ext: Warm, no deformities Skin: No new rashes, lesions or ulcers on visualized skin. Neuro: Alert and oriented. No focal neurological deficits. Psych: Judgement and insight appear fair. Mood euthymic & affect congruent. Behavior is appropriate.    Data Reviewed: I have personally reviewed following labs and imaging studies  CBC: Recent Labs  Lab 06/30/18 0924 06/30/18 1704 07/01/18 0852 07/02/18 0500 07/03/18 0639 07/04/18 0316  WBC 9.2 12.7* 9.7 13.6* 10.7* 7.7  NEUTROABS 6.7 10.7*  --   --   --   --   HGB 11.5* 7.7* 9.2* 10.2* 9.8* 9.2*  HCT 36.9 24.9* 29.5* 31.9*  30.8* 29.8*  MCV 64.1* 66.4* 70.2* 66.9* 66.4* 69.0*  PLT 245 211 175 188 205 620   Basic Metabolic Panel: Recent Labs  Lab 06/30/18 0924 06/30/18 1704 06/30/18 1705 07/01/18 0852 07/02/18 0500 07/03/18 0639 07/04/18 0316  NA 137 134*  --  137 135 137 136  K 3.8 3.1*  --  3.3* 3.0* 3.1* 4.2  CL 98 103  --  108 104 104 105  CO2 24 21*  --  22 23 24 24   GLUCOSE 122* 121*  --  113* 93 109* 109*  BUN 15 14  --  13 8 8 9   CREATININE 0.91 0.61  --  0.69 0.52 0.66 0.59  CALCIUM 9.8 7.6*  --  7.8* 8.2* 8.3* 8.4*  MG 1.6*  --  2.5*  --  1.5* 1.6* 1.8   GFR: Estimated Creatinine Clearance: 45 mL/min (by C-G formula based on SCr of 0.59 mg/dL). Liver Function Tests: Recent Labs  Lab 06/30/18 0924 06/30/18 1704 07/02/18 0500  AST 54* 56* 37  ALT 36  34 28  ALKPHOS 111 77 78  BILITOT 1.1 1.3* 0.8  PROT 9.2* 7.2 6.7  ALBUMIN 3.4* 3.0* 2.6*   Coagulation Profile: Recent Labs  Lab 06/30/18 1704 07/02/18 0500  INR 1.54 1.54   Cardiac Enzymes: Recent Labs  Lab 06/30/18 1705 06/30/18 2210 07/01/18 0852  TROPONINI 0.05* 0.05* 0.04*   Anemia Panel: No results for input(s): VITAMINB12, FOLATE, FERRITIN, TIBC, IRON, RETICCTPCT in the last 72 hours. Urine analysis:    Component Value Date/Time   COLORURINE YELLOW 07/01/2018 1457   APPEARANCEUR CLEAR 07/01/2018 1457   LABSPEC 1.030 07/01/2018 1457   PHURINE 5.0 07/01/2018 1457   GLUCOSEU NEGATIVE 07/01/2018 1457   HGBUR NEGATIVE 07/01/2018 [place] 07/01/2018 [place] 07/01/2018 1457   PROTEINUR NEGATIVE 07/01/2018 1457   NITRITE NEGATIVE 07/01/2018 1457   LEUKOCYTESUR TRACE (A) 07/01/2018 1457   Recent Results (from the past 240 hour(s))  Urine Culture     Status: Abnormal   Collection Time: 06/30/18  2:13 PM  Result Value Ref Range Status   Specimen Description   Final    URINE, CLEAN CATCH Performed at [facility], [place] [address], [place], [place]  [address]    Special Requests   Final    NONE Performed at [facility], [place] [address], [place], [place] [address]    Culture MULTIPLE SPECIES PRESENT, SUGGEST RECOLLECTION (A)  Final   Report Status 07/01/2018 FINAL  Final  Culture, Blood     Status: Abnormal   Collection Time: 06/30/18  2:39 PM  Result Value Ref Range Status   Specimen Description   Final    BLOOD LEFT ANTECUBITAL Performed at [facility], [place] [address] [place], [place] [address]    Special Requests   Final    BOTTLES DRAWN AEROBIC ONLY Blood Culture results may not be optimal due to an inadequate volume of blood received in culture bottles   Culture  Setup Time   Final    GRAM POSITIVE COCCI AEROBIC BOTTLE ONLY CRITICAL RESULT CALLED TO, READ BACK BY AND VERIFIED WITH: B GREEN PHARMD 0102 07/01/2018 A BROWNING Performed at [facility], [place] [address], [place], [place] [address]    Culture STAPHYLOCOCCUS AUREUS (A)  Final   Report Status 07/03/2018 FINAL  Final   Organism ID, Bacteria STAPHYLOCOCCUS AUREUS  Final      Susceptibility   Staphylococcus aureus - MIC*    CIPROFLOXACIN <=0.5 SENSITIVE Sensitive     ERYTHROMYCIN RESISTANT Resistant     GENTAMICIN <=0.5 SENSITIVE Sensitive     OXACILLIN 0.5 SENSITIVE Sensitive     TETRACYCLINE <=1 SENSITIVE Sensitive     VANCOMYCIN <=0.5 SENSITIVE Sensitive     TRIMETH/SULFA <=10 SENSITIVE Sensitive     CLINDAMYCIN RESISTANT Resistant     RIFAMPIN <=0.5 SENSITIVE Sensitive     Inducible Clindamycin POSITIVE Resistant     * STAPHYLOCOCCUS AUREUS  Blood Culture ID Panel (Reflexed)     Status: Abnormal   Collection Time: 06/30/18  2:39 PM  Result Value Ref Range Status   Enterococcus species NOT DETECTED NOT DETECTED Final   Listeria monocytogenes NOT DETECTED NOT DETECTED Final   Staphylococcus species DETECTED (A) NOT DETECTED Final    Comment: CRITICAL RESULT CALLED TO, READ BACK BY AND VERIFIED WITH: B  GREEN PHARMD 0610 07/01/2018 A BROWNING    Staphylococcus aureus (BCID) DETECTED (A) NOT DETECTED Final    Comment:  Methicillin (oxacillin) susceptible Staphylococcus aureus (MSSA). Preferred therapy is anti staphylococcal beta lactam antibiotic (Cefazolin or Nafcillin), unless clinically contraindicated. CRITICAL RESULT CALLED TO, READ BACK BY AND VERIFIED WITH: B GREEN PHARM 0610 07/01/2018 A BROWNING    Methicillin resistance NOT DETECTED NOT DETECTED Final   Streptococcus species NOT DETECTED NOT DETECTED Final   Streptococcus agalactiae NOT DETECTED NOT DETECTED Final   Streptococcus pneumoniae NOT DETECTED NOT DETECTED Final   Streptococcus pyogenes NOT DETECTED NOT DETECTED Final   Acinetobacter baumannii NOT DETECTED NOT DETECTED Final   Enterobacteriaceae species NOT DETECTED NOT DETECTED Final   Enterobacter cloacae complex NOT DETECTED NOT DETECTED Final   Escherichia coli NOT DETECTED NOT DETECTED Final   Klebsiella oxytoca NOT DETECTED NOT DETECTED Final   Klebsiella pneumoniae NOT DETECTED NOT DETECTED Final   Proteus species NOT DETECTED NOT DETECTED Final   Serratia marcescens NOT DETECTED NOT DETECTED Final   Haemophilus influenzae NOT DETECTED NOT DETECTED Final   Neisseria meningitidis NOT DETECTED NOT DETECTED Final   Pseudomonas aeruginosa NOT DETECTED NOT DETECTED Final   Candida albicans NOT DETECTED NOT DETECTED Final   Candida glabrata NOT DETECTED NOT DETECTED Final   Candida krusei NOT DETECTED NOT DETECTED Final   Candida parapsilosis NOT DETECTED NOT DETECTED Final   Candida tropicalis NOT DETECTED NOT DETECTED Final    Comment: Performed at [facility], [place] [address], [place], [place] [address]  Culture, blood (Routine x 2)     Status: Abnormal   Collection Time: 06/30/18  5:15 PM  Result Value Ref Range Status   Specimen Description   Final    BLOOD PORT Performed at [place] [address], [place], [place] [address]     Special Requests   Final    BOTTLES DRAWN AEROBIC AND ANAEROBIC Blood Culture results may not be optimal due to an inadequate volume of blood received in culture bottles Performed at [place] [address] [place], [place] [address]    Culture  Setup Time   Final    GRAM POSITIVE COCCI IN CLUSTERS IN BOTH AEROBIC AND ANAEROBIC BOTTLES CRITICAL RESULT CALLED TO, READ BACK BY AND VERIFIED WITH: Shelda Jakes PHARMD, AT 0820 07/01/18 BY D. VANHOOK    Culture (A)  Final    STAPHYLOCOCCUS AUREUS SUSCEPTIBILITIES PERFORMED ON PREVIOUS CULTURE WITHIN THE LAST 5 DAYS. Performed at [facility], [place] [address] [place], [place] [address]    Report Status 07/03/2018 FINAL  Final  Culture, blood (Routine x 2)     Status: None (Preliminary result)   Collection Time: 06/30/18 10:10 PM  Result Value Ref Range Status   Specimen Description   Final    BLOOD RIGHT WRIST Performed at [place] [address], [place], [place] [address]    Special Requests   Final    BOTTLES DRAWN AEROBIC AND ANAEROBIC Blood Culture adequate volume Performed at [place] [address], [place], [place] [address]    Culture   Final    NO GROWTH 3 DAYS Performed at [facility], [place] [address], [place], [place] [address]    Report Status PENDING  Incomplete  Urine culture     Status: None   Collection Time: 07/01/18  2:57 PM  Result Value Ref Range Status   Specimen Description URINE, RANDOM  Final   Special Requests   Final    NONE Performed at [facility],  [place] [address], [place], [place] [address]    Culture NO GROWTH  Final   Report Status 07/02/2018 FINAL  Final  MRSA PCR Screening     Status: None   Collection Time: 07/01/18 11:26 PM  Result Value Ref Range Status   MRSA by PCR NEGATIVE NEGATIVE Final    Comment:        The GeneXpert MRSA Assay (FDA approved for NASAL specimens only), is one component  of a comprehensive MRSA colonization surveillance program. It is not intended to diagnose MRSA infection nor to guide or monitor treatment for MRSA infections. Performed at [facility], [place] [address] [place], [place] [address]   Culture, blood (routine x 2)     Status: None (Preliminary result)   Collection Time: 07/03/18  6:39 AM  Result Value Ref Range Status   Specimen Description   Final    BLOOD RIGHT HAND Performed at [place] [address] [place], [place] [address]    Special Requests   Final    BOTTLES DRAWN AEROBIC ONLY Blood Culture adequate volume Performed at [place] [address], [place], [place] [address]    Culture   Final    NO GROWTH 1 DAY Performed at [facility], [place] [address] [place], [place] [address]    Report Status PENDING  Incomplete  Culture, blood (routine x 2)     Status: None (Preliminary result)   Collection Time: 07/03/18  6:39 AM  Result Value Ref Range Status   Specimen Description   Final    BLOOD LEFT HAND Performed at [place] [address] [place], [place] [address]    Special Requests   Final    BOTTLES DRAWN AEROBIC ONLY Blood Culture adequate volume Performed at [place] [address], [place], [place] [address]    Culture   Final    NO GROWTH 1 DAY Performed at [facility], [place] [address], [place],  [address]    Report Status PENDING  Incomplete      Radiology Studies: No results found.  Scheduled Meds: . carvedilol  12.5 mg Oral BID WC  . lactose free nutrition  237 mL Oral TID WC  . magnesium oxide  400 mg Oral Daily  . mirtazapine  30 mg Oral QHS  . multivitamin with minerals  1 tablet Oral Daily   Continuous Infusions: . sodium chloride 50 mL/hr at 07/04/18 1000  .  ceFAZolin (ANCEF) IV Stopped (07/04/18 0920)     LOS: 3 days   Time spent: 25 minutes.  Patrecia Pour, MD [facility] [email or web address] Password [facility] 07/04/2018, 3:40 PM

## 2018-07-04 NOTE — Transfer of Care (Signed)
Immediate Anesthesia Transfer of Care Note  Patient: Jo Parker  Procedure(s) Performed: TRANSESOPHAGEAL ECHOCARDIOGRAM (TEE) (N/A )  Patient Location: Endoscopy Unit  Anesthesia Type:MAC  Level of Consciousness: drowsy  Airway & Oxygen Therapy: Patient Spontanous Breathing and Patient connected to nasal cannula oxygen  Post-op Assessment: Report given to RN and Post -op Vital signs reviewed and stable  Post vital signs: Reviewed and stable  Last Vitals:  Vitals Value Taken Time  BP 104/46 07/04/2018  2:22 PM  Temp    Pulse 81 07/04/2018  2:23 PM  Resp 36 07/04/2018  2:23 PM  SpO2 99 % 07/04/2018  2:23 PM  Vitals shown include unvalidated device data.  Last Pain:  Vitals:   07/04/18 1307  TempSrc: Oral  PainSc: 0-No pain      Patients Stated Pain Goal: 0 (88/50/27 7412)  Complications: No apparent anesthesia complications

## 2018-07-04 NOTE — Anesthesia Postprocedure Evaluation (Signed)
Anesthesia Post Note  Patient: Jo Parker  Procedure(s) Performed: TRANSESOPHAGEAL ECHOCARDIOGRAM (TEE) (N/A )     Patient location during evaluation: PACU Anesthesia Type: MAC Level of consciousness: awake and alert Pain management: pain level controlled Vital Signs Assessment: post-procedure vital signs reviewed and stable Respiratory status: spontaneous breathing, nonlabored ventilation, respiratory function stable and patient connected to nasal cannula oxygen Cardiovascular status: stable and blood pressure returned to baseline Postop Assessment: no apparent nausea or vomiting Anesthetic complications: no    Last Vitals:  Vitals:   07/04/18 1600 07/04/18 2021  BP: 130/72 130/71  Pulse: 92 90  Resp: (!) 22 20  Temp: 36.7 C 37 C  SpO2: 97% 98%    Last Pain:  Vitals:   07/04/18 2021  TempSrc: Oral  PainSc:                  Jo Parker

## 2018-07-04 NOTE — Anesthesia Procedure Notes (Signed)
Procedure Name: MAC Date/Time: 07/04/2018 2:04 PM Performed by: Colin Benton, CRNA Pre-anesthesia Checklist: Patient identified, Emergency Drugs available, Suction available and Patient being monitored Patient Re-evaluated:Patient Re-evaluated prior to induction Oxygen Delivery Method: Nasal cannula Induction Type: IV induction Placement Confirmation: positive ETCO2 Dental Injury: Teeth and Oropharynx as per pre-operative assessment

## 2018-07-04 NOTE — Interval H&P Note (Signed)
History and Physical Interval Note:  07/04/2018 1:21 PM  Jo Parker  has presented today for surgery, with the diagnosis of bacteremia  The various methods of treatment have been discussed with the patient and family. After consideration of risks, benefits and other options for treatment, the patient has consented to  Procedure(s): TRANSESOPHAGEAL ECHOCARDIOGRAM (TEE) (N/A) as a surgical intervention .  The patient's history has been reviewed, patient examined, no change in status, stable for surgery.  I have reviewed the patient's chart and labs.  Questions were answered to the patient's satisfaction.     [name]

## 2018-07-04 NOTE — Progress Notes (Signed)
OT Cancellation Note  Patient Details Name: Jo Parker MRN: [id number] DOB: Jul 24, 1945   Cancelled Treatment:    Reason Eval/Treat Not Completed: Patient at procedure or test/ unavailable (TEE); will follow up for OT eval at a later time.   Lou Cal, OT [facility] Pager [phone] Office [phone]   Raymondo Band 07/04/2018, 1:21 PM

## 2018-07-05 LAB — CBC
HCT: 30 % — ABNORMAL LOW (ref 36.0–46.0)
Hemoglobin: 9.3 g/dL — ABNORMAL LOW (ref 12.0–15.0)
MCH: 21.4 pg — AB (ref 26.0–34.0)
MCHC: 31 g/dL (ref 30.0–36.0)
MCV: 69 fL — AB (ref 80.0–100.0)
Platelets: 238 10*3/uL (ref 150–400)
RBC: 4.35 MIL/uL (ref 3.87–5.11)
RDW: 19.8 % — ABNORMAL HIGH (ref 11.5–15.5)
WBC: 7 10*3/uL (ref 4.0–10.5)
nRBC: 0 % (ref 0.0–0.2)

## 2018-07-05 LAB — MAGNESIUM: Magnesium: 1.7 mg/dL (ref 1.7–2.4)

## 2018-07-05 LAB — BASIC METABOLIC PANEL
Anion gap: 8 (ref 5–15)
BUN: 9 mg/dL (ref 8–23)
CHLORIDE: 106 mmol/L (ref 98–111)
CO2: 25 mmol/L (ref 22–32)
Calcium: 8.5 mg/dL — ABNORMAL LOW (ref 8.9–10.3)
Creatinine, Ser: 0.5 mg/dL (ref 0.44–1.00)
GFR calc Af Amer: >60 mL/min (ref >60)
GFR calc non Af Amer: >60 mL/min (ref >60)
Glucose, Bld: 105 mg/dL — ABNORMAL HIGH (ref 70–99)
Potassium: 3.4 mmol/L — ABNORMAL LOW (ref 3.5–5.1)
Sodium: 139 mmol/L (ref 135–145)

## 2018-07-05 MED ORDER — POLYETHYLENE GLYCOL 3350 17 G PO PACK
17.0000 g | PACK | Freq: Every day | ORAL | Status: DC
  Administered 2018-07-05 – 2018-07-07 (×3): 17 g via ORAL
  Filled 2018-07-05 (×3): qty 1

## 2018-07-05 MED ORDER — POTASSIUM CHLORIDE 10 MEQ/100ML IV SOLN
10.0000 meq | INTRAVENOUS | Status: AC
  Administered 2018-07-05 (×2): 10 meq via INTRAVENOUS
  Filled 2018-07-05 (×2): qty 100

## 2018-07-05 MED ORDER — MAGNESIUM SULFATE 4 GM/100ML IV SOLN
4.0000 g | Freq: Once | INTRAVENOUS | Status: AC
  Administered 2018-07-05: 4 g via INTRAVENOUS
  Filled 2018-07-05: qty 100

## 2018-07-05 NOTE — Plan of Care (Signed)
  Problem: Education: Goal: Knowledge of General Education information will improve Description Including pain rating scale, medication(s)/side effects and non-pharmacologic comfort measures Outcome: Progressing   Problem: Health Behavior/Discharge Planning: Goal: Ability to manage health-related needs will improve Outcome: Progressing   Problem: Clinical Measurements: Goal: Ability to maintain clinical measurements within normal limits will improve Outcome: Progressing Goal: Will remain free from infection Outcome: Progressing Note:  IV abx, plan for PICC once bl cx neg x 48 hrs.  Goal: Diagnostic test results will improve Outcome: Progressing   Problem: Activity: Goal: Risk for activity intolerance will decrease Outcome: Progressing Note:  Working w/ PT/OT.    Problem: Nutrition: Goal: Adequate nutrition will be maintained Outcome: Progressing   Problem: Elimination: Goal: Will not experience complications related to bowel motility Outcome: Progressing   Problem: Pain Managment: Goal: General experience of comfort will improve Outcome: Progressing   Problem: Safety: Goal: Ability to remain free from injury will improve Outcome: Progressing   Problem: Skin Integrity: Goal: Risk for impaired skin integrity will decrease Outcome: Progressing

## 2018-07-05 NOTE — Progress Notes (Signed)
PT Cancellation Note  Patient Details Name: Jo Parker MRN: [id number] DOB: 03/20/1946   Cancelled Treatment:    Reason Eval/Treat Not Completed: Patient declined.  Pt just returned back to bed and just started to eat lunch.  She saw OT earlier today. Will check back as schedule permits.   Galen Manila 07/05/2018, 1:02 PM

## 2018-07-05 NOTE — Care Management Important Message (Signed)
Important Message  Patient Details  Name: [name] MRN: [id number] Date of Birth: Jul 30, 1945   Medicare Important Message Given:  Yes    Kerin Salen 07/05/2018, 12:58 [place] Message  Patient Details  Name: Jo Parker MRN: [id number] Date of Birth: Jan 31, 1946   Medicare Important Message Given:  Yes    Kerin Salen 07/05/2018, 12:58 PM

## 2018-07-05 NOTE — Progress Notes (Signed)
PROGRESS NOTE  Jo Parker  GQQ:[id number] DOB: August 31, 1945 DOA: 06/30/2018 PCP: [name], MD   Brief Narrative: Jo Parker is a 73 y.o. female with medical history significant of peritoneal carcinomatosis, ovarian cancer, malnutrition, presents with weakness.  Patient has been living back at home in [place] for the past few weeks.  When she was visited by her son he noted she was very weak.  Patient admits to a fall about a week ago where she hit her head.  At the oncology office patient had a fever and seemed a little altered.  She had a low mag and was given some IV magnesium and IV fluids and referred over to the ED.  She really did not complain of very much and she is quite stoic.  Patient's last chemo was August per son.  He states she was told she was deemed cancer free.  She is anorexic and eats very little.  She denies any obvious bleeding.  Patient's cancer course is complicated.  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  Patient is status post debulking and pelvic drain placement October 2019.  She is status post total abdominal hysterectomy with adjuvant chemo in August 2019.  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  She was found to have worsening anemia with a hemoglobin of 7.7.  Her baseline appears to be around 10.  She did have a temperature.  She had a mildly elevated troponin but denied any chest pain.  Patient had unremarkable lactic acid.  UA done at the outpatient office showed small leukocytes but no bacteria and no nitrites.  Patient flu  was negative.  Her stool was negative for blood per ED doctor notes that she had very little stool to test.  Chest x-ray showed small effusion.  Assessment &  Plan: Principal Problem:   MSSA bacteremia Active Problems:   Primary peritoneal carcinomatosis ([place])   Severe protein-calorie malnutrition ([name])   COPD (chronic obstructive pulmonary disease) ([name])   Hypomagnesemia   Hypokalemia   Anemia   Fever in adult   Weakness generalized   SIRS (systemic inflammatory response syndrome) ([name])   Sepsis due to Staphylococcus aureus ([place])  Sepsis due to MSSA bacteremia: Per report, had prolonged port access as outpatient so this is likely source. Port (suspected source) removed 1/25. No endocarditis on TEE 1/27.  - Sepsis pathophysiology has resolved.  - Narrowed abx to ancef with plans for 2 weeks course.  - Blood cultures repeated 1/26 are no growth to date at 48 hours. Discussed with Dr. Baxter Flattery, ID, who recommends monitoring today, and insertion of PICC 1/29 if Cx's negative and sepsis remains resolved. Patient needs PICC line regardless as she gets intermittent IV fluids, etc. at home to prevent dehydration/readmissions. She would not be a candidate for AVF/AVG were she to experience renal failure.   Primary peritoneal carcinomatosis: No evidence of recurrence (unremarkable tumor marker), though functional decline is evident. Not undergoing active therapy.  - Appreciate pt's oncologist following the patient.  Generalized weakness, fall at home: No traumatic fractures, etc. on exam.  - Continue PT/OT efforts. Declines SNF placement at this time. Will need 24hr care with son.   Severe protein calorie malnutrition: Due  to chronic illness.  - Dietitian consulted, recommending protein supplementation. BMI 18.   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 - IR evaluated drain, placed to gravity drainage, noted ongoing fistula on imaging, advised against removing drain. F/u in 3-4 weeks for injection for  evaluation. - Was due to flex sigmoidoscopy with Dr. Marcello Moores as outpatient 1/23. Will defer this for the time being as fistula remains on imaging.  Hypomagnesemia: Acute on chronic - Replaced, recheck daily  Hypokalemia: Recurrent.  - Supp and recheck  Multifactorial anemia: Due to thalassemia, malignancy, chemotherapy.  - s/p 1u PRBCs. Continue to monitor and transfuse prn.  DVT prophylaxis: SCDs Code Status: Full Family Communication: Son by phone Disposition Plan: Plan, if remains stable, to insert PICC on 1/29 and discharge to home w/son w/home health services.   Consultants:   Infectious disease  Oncology  Interventional radiology  Procedures:   Port-A-Cath removal 1/25  TEE: No evidence of vegetation  Antimicrobials:  Vancomycin, flagyl, cefepime 1/23  Ancef 1/24 >>    Subjective: Afebrile for 24 hours, feels she's beginning to regain some strength. Eating better.   Objective: Vitals:   07/04/18 2021 07/05/18 0448 07/05/18 0756 07/05/18 1441  BP: 130/71 137/66 (!) 147/79 (!) 157/80  Pulse: 90 90 88 81  Resp: 20 14 16 16   Temp: 98.6 F (37 C) 98.3 F (36.8 C) 98.2 F (36.8 C) 98.9 F (37.2 C)  TempSrc: Oral Oral Oral Oral  SpO2: 98% 96% 96% 100%  Weight: 48 kg     Height: 5\' 3"  (1.6 m)       Intake/Output Summary (Last 24 hours) at 07/05/2018 1501 Last data filed at 07/05/2018 1442 Gross per 24 hour  Intake 2768.99 ml  Output 1700 ml  Net 1068.99 ml   Filed Weights   06/30/18 1514 07/01/18 2138 07/04/18 2021  Weight: 46.4 kg 49.4 kg 48 kg   Gen: Frail, pleasant female in no distress Pulm: Nonlabored breathing room air. Clear. CV: Regular rate and rhythm. No murmur, rub, or gallop. No JVD, no dependent edema. GI: Abdomen soft, non-tender, non-distended, with normoactive bowel sounds.  Ext: Warm, no deformities Skin: No new rashes, lesions or ulcers on visualized skin. R upper chest port site with dermabond, appropriately tender to palpation  without fluctuance, erythema, induration or discharge. Neuro: Alert and oriented. No focal neurological deficits. Psych: Judgement and insight appear fair. Mood euthymic & affect congruent. Behavior is appropriate.    Data Reviewed: I have personally reviewed following labs and imaging studies  CBC: Recent Labs  Lab 06/30/18 0924  06/30/18 1704 07/01/18 0852 07/02/18 0500 07/03/18 0639 07/04/18 0316 07/05/18 0542  WBC 9.2   < > 12.7* 9.7 13.6* 10.7* 7.7 7.0  NEUTROABS 6.7  --  10.7*  --   --   --   --   --   HGB 11.5*   < > 7.7* 9.2* 10.2* 9.8* 9.2* 9.3*  HCT 36.9  --  24.9* 29.5* 31.9* 30.8* 29.8* 30.0*  MCV 64.1*  --  66.4* 70.2* 66.9* 66.4* 69.0* 69.0*  PLT 245   < > 211 175 188 205 206 238   < > = values in this interval not displayed.   Basic Metabolic Panel: Recent Labs  Lab 06/30/18 1705 07/01/18 0852 07/02/18 0500 07/03/18 0639 07/04/18 0316 07/05/18 0542  NA  --  137 135 137 136  139  K  --  3.3* 3.0* 3.1* 4.2 3.4*  CL  --  108 104 104 105 106  CO2  --  22 23 24 24 25   GLUCOSE  --  113* 93 109* 109* 105*  BUN  --  13 8 8 9 9   CREATININE  --  0.69 0.52 0.66 0.59 0.50  CALCIUM  --  7.8* 8.2* 8.3* 8.4* 8.5*  MG 2.5*  --  1.5* 1.6* 1.8 1.7   GFR: Estimated Creatinine Clearance: 47.5 mL/min (by C-G formula based on SCr of 0.5 mg/dL). Liver Function Tests: Recent Labs  Lab 06/30/18 0924 06/30/18 1704 07/02/18 0500  AST 54* 56* 37  ALT 36 34 28  ALKPHOS 111 77 78  BILITOT 1.1 1.3* 0.8  PROT 9.2* 7.2 6.7  ALBUMIN 3.4* 3.0* 2.6*   Coagulation Profile: Recent Labs  Lab 06/30/18 1704 07/02/18 0500  INR 1.54 1.54   Cardiac Enzymes: Recent Labs  Lab 06/30/18 1705 06/30/18 2210 07/01/18 0852  TROPONINI 0.05* 0.05* 0.04*   Anemia Panel: No results for input(s): VITAMINB12, FOLATE, FERRITIN, TIBC, IRON, RETICCTPCT in the last 72 hours. Urine analysis:    Component Value Date/Time   COLORURINE YELLOW 07/01/2018 1457   APPEARANCEUR CLEAR 07/01/2018  1457   LABSPEC 1.030 07/01/2018 1457   PHURINE 5.0 07/01/2018 1457   GLUCOSEU NEGATIVE 07/01/2018 1457   HGBUR NEGATIVE 07/01/2018 [place] 07/01/2018 [place] 07/01/2018 1457   PROTEINUR NEGATIVE 07/01/2018 1457   NITRITE NEGATIVE 07/01/2018 1457   LEUKOCYTESUR TRACE (A) 07/01/2018 1457   Recent Results (from the past 240 hour(s))  Urine Culture     Status: Abnormal   Collection Time: 06/30/18  2:13 PM  Result Value Ref Range Status   Specimen Description   Final    URINE, CLEAN CATCH Performed at [facility], [place] [address], [place], [place] [address]    Special Requests   Final    NONE Performed at [facility], [place] [address], [place], [place] [address]    Culture MULTIPLE SPECIES PRESENT, SUGGEST RECOLLECTION (A)  Final   Report Status 07/01/2018 FINAL  Final  Culture, Blood     Status: Abnormal   Collection Time: 06/30/18  2:39 PM  Result Value Ref Range Status   Specimen Description   Final    BLOOD LEFT ANTECUBITAL Performed at [facility], [place] [address] [place], [place] [address]    Special Requests   Final    BOTTLES DRAWN AEROBIC ONLY Blood Culture results may not be optimal due to an inadequate volume of blood received in culture bottles   Culture  Setup Time   Final    GRAM POSITIVE COCCI AEROBIC BOTTLE ONLY CRITICAL RESULT CALLED TO, READ BACK BY AND VERIFIED WITH: B GREEN PHARMD [id number] 07/01/2018 A BROWNING Performed at [facility], [place] [address], [place], [place] [address]    Culture STAPHYLOCOCCUS AUREUS (A)  Final   Report Status 07/03/2018 FINAL  Final   Organism ID, Bacteria STAPHYLOCOCCUS AUREUS  Final      Susceptibility   Staphylococcus aureus - MIC*    CIPROFLOXACIN <=0.5 SENSITIVE Sensitive     ERYTHROMYCIN RESISTANT Resistant     GENTAMICIN <=0.5 SENSITIVE Sensitive     OXACILLIN 0.5 SENSITIVE Sensitive     TETRACYCLINE <=1  SENSITIVE Sensitive     VANCOMYCIN <=0.5 SENSITIVE Sensitive     TRIMETH/SULFA <=10 SENSITIVE Sensitive  CLINDAMYCIN RESISTANT Resistant     RIFAMPIN <=0.5 SENSITIVE Sensitive     Inducible Clindamycin POSITIVE Resistant     * STAPHYLOCOCCUS AUREUS  Blood Culture ID Panel (Reflexed)     Status: Abnormal   Collection Time: 06/30/18  2:39 PM  Result Value Ref Range Status   Enterococcus species NOT DETECTED NOT DETECTED Final   Listeria monocytogenes NOT DETECTED NOT DETECTED Final   Staphylococcus species DETECTED (A) NOT DETECTED Final    Comment: CRITICAL RESULT CALLED TO, READ BACK BY AND VERIFIED WITH: B GREEN PHARMD 0610 07/01/2018 A BROWNING    Staphylococcus aureus (BCID) DETECTED (A) NOT DETECTED Final    Comment: Methicillin (oxacillin) susceptible Staphylococcus aureus (MSSA). Preferred therapy is anti staphylococcal beta lactam antibiotic (Cefazolin or Nafcillin), unless clinically contraindicated. CRITICAL RESULT CALLED TO, READ BACK BY AND VERIFIED WITH: B GREEN PHARM 0610 07/01/2018 A BROWNING    Methicillin resistance NOT DETECTED NOT DETECTED Final   Streptococcus species NOT DETECTED NOT DETECTED Final   Streptococcus agalactiae NOT DETECTED NOT DETECTED Final   Streptococcus pneumoniae NOT DETECTED NOT DETECTED Final   Streptococcus pyogenes NOT DETECTED NOT DETECTED Final   Acinetobacter baumannii NOT DETECTED NOT DETECTED Final   Enterobacteriaceae species NOT DETECTED NOT DETECTED Final   Enterobacter cloacae complex NOT DETECTED NOT DETECTED Final   Escherichia coli NOT DETECTED NOT DETECTED Final   Klebsiella oxytoca NOT DETECTED NOT DETECTED Final   Klebsiella pneumoniae NOT DETECTED NOT DETECTED Final   Proteus species NOT DETECTED NOT DETECTED Final   Serratia marcescens NOT DETECTED NOT DETECTED Final   Haemophilus influenzae NOT DETECTED NOT DETECTED Final   Neisseria meningitidis NOT DETECTED NOT DETECTED Final   Pseudomonas aeruginosa NOT DETECTED NOT  DETECTED Final   Candida albicans NOT DETECTED NOT DETECTED Final   Candida glabrata NOT DETECTED NOT DETECTED Final   Candida krusei NOT DETECTED NOT DETECTED Final   Candida parapsilosis NOT DETECTED NOT DETECTED Final   Candida tropicalis NOT DETECTED NOT DETECTED Final    Comment: Performed at [facility], [place] [address], [place], [place] [address]  Culture, blood (Routine x 2)     Status: Abnormal   Collection Time: 06/30/18  5:15 PM  Result Value Ref Range Status   Specimen Description   Final    BLOOD PORT Performed at [place] [address], [place], [place] [address]    Special Requests   Final    BOTTLES DRAWN AEROBIC AND ANAEROBIC Blood Culture results may not be optimal due to an inadequate volume of blood received in culture bottles Performed at [place] [address] [place], [place] [address]    Culture  Setup Time   Final    GRAM POSITIVE COCCI IN CLUSTERS IN BOTH AEROBIC AND ANAEROBIC BOTTLES CRITICAL RESULT CALLED TO, READ BACK BY AND VERIFIED WITH: Shelda Jakes PHARMD, AT 0820 07/01/18 BY D. VANHOOK    Culture (A)  Final    STAPHYLOCOCCUS AUREUS SUSCEPTIBILITIES PERFORMED ON PREVIOUS CULTURE WITHIN THE LAST 5 DAYS. Performed at [facility], [place] [address], [place], [place] [address]    Report Status 07/03/2018 FINAL  Final  Culture, blood (Routine x 2)     Status: None (Preliminary result)   Collection Time: 06/30/18 10:10 PM  Result Value Ref Range Status   Specimen Description   Final    BLOOD RIGHT WRIST Performed at [place] [address], [place], [place] [address]  Special Requests   Final    BOTTLES DRAWN AEROBIC AND ANAEROBIC Blood Culture adequate volume Performed at [place] [address], [place], [place] [address]    Culture   Final    NO GROWTH 4 DAYS Performed at [facility], [place] [address] [place], [place] [address]     Report Status PENDING  Incomplete  Urine culture     Status: None   Collection Time: 07/01/18  2:57 PM  Result Value Ref Range Status   Specimen Description URINE, RANDOM  Final   Special Requests   Final    NONE Performed at [facility] [address], [place], [place] [address]    Culture NO GROWTH  Final   Report Status 07/02/2018 FINAL  Final  MRSA PCR Screening     Status: None   Collection Time: 07/01/18 11:26 PM  Result Value Ref Range Status   MRSA by PCR NEGATIVE NEGATIVE Final    Comment:        The GeneXpert MRSA Assay (FDA approved for NASAL specimens only), is one component of a comprehensive MRSA colonization surveillance program. It is not intended to diagnose MRSA infection nor to guide or monitor treatment for MRSA infections. Performed at [facility], [place] [address] [place], [place] [address]   Culture, blood (routine x 2)     Status: None (Preliminary result)   Collection Time: 07/03/18  6:39 AM  Result Value Ref Range Status   Specimen Description   Final    BLOOD RIGHT HAND Performed at [facility] [address], [place], [place] [address]    Special Requests   Final    BOTTLES DRAWN AEROBIC ONLY Blood Culture adequate volume Performed at [place] [address], [place], [place] [address]    Culture   Final    NO GROWTH 2 DAYS Performed at [place] [address] [place], [place] [address]    Report Status PENDING  Incomplete  Culture, blood (routine x 2)     Status: None (Preliminary result)   Collection Time: 07/03/18  6:39 AM  Result Value Ref Range Status   Specimen Description   Final    BLOOD LEFT HAND Performed at [facility] [address] [place], [place] [address]    Special Requests   Final    BOTTLES DRAWN AEROBIC ONLY Blood Culture adequate volume Performed at [facility] [address], [place], [place] [address]    Culture   Final    NO GROWTH 2 DAYS Performed at [facility] [address], [place],  [address]    Report Status PENDING  Incomplete      Radiology Studies: No results found.  Scheduled Meds: . carvedilol  12.5 mg Oral BID WC  . lactose free nutrition  237 mL Oral TID WC  . magnesium oxide  400 mg Oral Daily  . mirtazapine  30 mg Oral QHS  . multivitamin with minerals  1 tablet Oral Daily  . polyethylene glycol  17 g Oral Daily   Continuous Infusions: . sodium chloride 100 mL/hr at 07/05/18 1400  .  ceFAZolin (ANCEF) IV Stopped (07/05/18 1015)  . potassium chloride 10 mEq (07/05/18 1457)     LOS: 4 days   Time spent: 25 minutes.  Patrecia Pour, MD [facility] [email or web address] Password [facility] 07/05/2018, 3:01 PM

## 2018-07-05 NOTE — Progress Notes (Signed)
    [facility] for Infectious Disease    Date of Admission:  06/30/2018   Total days of antibiotics 6           ID: Jo Parker is a 73 y.o. female with MSSA sepsis due to mssa bacteremia related to portacath Principal Problem:   MSSA bacteremia Active Problems:   Primary peritoneal carcinomatosis ([place])   Severe protein-calorie malnutrition ([name])   COPD (chronic obstructive pulmonary disease) ([name])   Hypomagnesemia   Hypokalemia   Anemia   Fever in adult   Weakness generalized   SIRS (systemic inflammatory response syndrome) ([place])   Sepsis due to Staphylococcus aureus ([name])    Subjective: Afebrile. Had TEE which did not show any endocarditis  Medications:  . carvedilol  12.5 mg Oral BID WC  . lactose free nutrition  237 mL Oral TID WC  . magnesium oxide  400 mg Oral Daily  . mirtazapine  30 mg Oral QHS  . multivitamin with minerals  1 tablet Oral Daily  . polyethylene glycol  17 g Oral Daily    Objective: Vital signs in last 24 hours: Temp:  [98.2 F (36.8 C)-98.9 F (37.2 C)] 98.9 F (37.2 C) (01/28 1441) Pulse Rate:  [81-90] 81 (01/28 1441) Resp:  [14-20] 16 (01/28 1441) BP: (130-157)/(66-80) 157/80 (01/28 1441) SpO2:  [96 %-100 %] 100 % (01/28 1441) Weight:  [48 kg] 48 kg (01/27 2021) Physical Exam  Constitutional:  oriented to person, place, and time. appears chronically ill and mal-nourished. No distress.  HENT: [place]/AT, PERRLA, no scleral icterus Mouth/Throat: Oropharynx is clear and moist. No oropharyngeal exudate.  Cardiovascular: Normal rate, regular rhythm and normal heart sounds. Exam reveals no gallop and no friction rub.  No murmur heard.  Pulmonary/Chest: Effort normal and breath sounds normal. No respiratory distress.  has no wheezes.  Neck = supple, no nuchal rigidity Abdominal: Soft. Bowel sounds are normal.  exhibits no distension. mild tenderness- drain placement Lymphadenopathy: no cervical adenopathy. No axillary  adenopathy Neurological: alert and oriented to person, place, and time.  Skin: Skin is warm and dry. No rash noted. No erythema.  Psychiatric: a normal mood and affect.  behavior is normal.    Lab Results Recent Labs    07/04/18 0316 07/05/18 0542  WBC 7.7 7.0  HGB 9.2* 9.3*  HCT 29.8* 30.0*  NA 136 139  K 4.2 3.4*  CL 105 106  CO2 24 25  BUN 9 9  CREATININE 0.59 0.50    Microbiology: 1/26 blood cx ngtd Studies/Results: No results found.   Assessment/Plan: Sepsis due to MSSA bacteremia = line removed and patient has been subsequent afebrile. Also had TEE which was negative for endocarditis. Recommend to do 2 wk of cefazolin using 1/27 as day 1. Can place picc line tomorrow.   [facility] for Infectious Diseases Cell: [phone] Pager: [phone]  07/05/2018, 4:48 PM

## 2018-07-05 NOTE — Evaluation (Signed)
Occupational Therapy Evaluation Patient Details Name: Jo Parker MRN: [id number] DOB: December 08, 1945 Today's Date: 07/05/2018    History of Present Illness 73 yo female admitted with fever, weakness. Dx SIRS. Hx of peritoneal ca, Ovarian ca, malnutrittion, pelvic abscess s/p debulking sg/drain placement 03/2018, total hysterectomy 01/2018, VDRF, CHF, OA, COPD   Clinical Impression   Pt was admitted for the above.  Pt was not clear about PLOF; she has been staying with son in [place] and granddaughter has been assisting her.  She is from [place]. Will follow in acute setting with min guard level goals. She needs min A for mobility and mod to max A for LB adls    Follow Up Recommendations  Supervision/Assistance - 24 hour;Home health OT    Equipment Recommendations  (? 3:1 commode, unsure of son's bathroom)    Recommendations for Other Services       Precautions / Restrictions Precautions Precautions: Fall Precaution Comments: drain L side Restrictions Weight Bearing Restrictions: No      Mobility Bed Mobility         Supine to sit: Min assist;HOB elevated     General bed mobility comments: extra time for all activities  Transfers   Equipment used: 1 person hand held assist   Sit to Stand: Min assist         General transfer comment: assist to rise and stabilize.  Recommend use of RW    Balance                                           ADL either performed or assessed with clinical judgement   ADL Overall ADL's : Needs assistance/impaired     Grooming: Set up   Upper Body Bathing: Set up   Lower Body Bathing: Moderate assistance   Upper Body Dressing : Minimal assistance   Lower Body Dressing: Maximal assistance   Toilet Transfer: Minimal assistance   Toileting- Clothing Manipulation and Hygiene: Minimal assistance         General ADL Comments: pt ambulated around bed slowly holding to bedrails, foot of bed. Did not  want to use walker.  Would benefit from this for stability     Vision         Perception     Praxis      Pertinent Vitals/Pain Pain Assessment: Faces Faces Pain Scale: Hurts little more Pain Location: abdomen/drain site Pain Descriptors / Indicators: Sore Pain Intervention(s): Limited activity within patient's tolerance;Monitored during session;Repositioned     Hand Dominance     Extremity/Trunk Assessment Upper Extremity Assessment Upper Extremity Assessment: Generalized weakness           Communication Communication Communication: No difficulties   Cognition Arousal/Alertness: Awake/alert Behavior During Therapy: WFL for tasks assessed/performed Overall Cognitive Status: Impaired/Different from baseline                                 General Comments: pt had difficulty with PLOF questions.  Decreased awareness of deficits   General Comments       Exercises     Shoulder Instructions      Home Living Family/patient expects to be discharged to:: Private residence Living Arrangements: Children;Other relatives Available Help at Discharge: Family  Home Equipment: [place] - 2 wheels;Cane - single point   Additional Comments: pt plans to d/c to son's home and granddaughter will be home with her. States she won't have any trouble using commode, ? if she has a 3:1      Prior Functioning/Environment          Comments: had difficulty with PLOF questions. Pt states she pretty much did for herself        OT Problem List: Decreased strength;Decreased activity tolerance;Pain;Impaired balance (sitting and/or standing);Decreased cognition;Decreased safety awareness;Decreased knowledge of use of DME or AE      OT Treatment/Interventions: Self-care/ADL training;DME and/or AE instruction;Patient/family education;Balance training;Therapeutic activities;Cognitive remediation/compensation    OT Goals(Current goals can be  found in the care plan section) Acute Rehab OT Goals Patient Stated Goal: home. regain PLOF.  OT Goal Formulation: With patient Time For Goal Achievement: 07/19/18 Potential to Achieve Goals: Good ADL Goals Pt Will Perform Grooming: with min guard assist;standing Pt Will Perform Lower Body Bathing: with min assist;sit to/from stand Pt Will Transfer to Toilet: with min guard assist;ambulating;bedside commode Pt Will Perform Toileting - Clothing Manipulation and hygiene: with min guard assist;sit to/from stand Additional ADL Goal #1: pt will not need any safety cues during adls/toilet transfers  OT Frequency: Min 2X/week   Barriers to D/C:            Co-evaluation              AM-PAC OT "6 Clicks" Daily Activity     Outcome Measure Help from another person eating meals?: None Help from another person taking care of personal grooming?: A Little Help from another person toileting, which includes using toliet, bedpan, or urinal?: A Little Help from another person bathing (including washing, rinsing, drying)?: A Lot Help from another person to put on and taking off regular upper body clothing?: A Little Help from another person to put on and taking off regular lower body clothing?: A Lot 6 Click Score: 17   End of Session    Activity Tolerance: Patient tolerated treatment well Patient left: in chair;with call bell/phone within reach;with chair alarm set  OT Visit Diagnosis: Unsteadiness on feet (R26.81);Muscle weakness (generalized) (M62.81)                Time: 3710-6269 OT Time Calculation (min): 24 min Charges:  OT General Charges $OT Visit: 1 Visit OT Evaluation $OT Eval Low Complexity: 1 Low OT Treatments $Self Care/Home Management : 8-22 mins  Lesle Chris, OTR/L Acute Rehabilitation Services [phone] [facility] pager [phone] office 07/05/2018  [name] 07/05/2018, 10:07 AM

## 2018-07-05 NOTE — Progress Notes (Signed)
Jo Parker   DOB:Jul 09, 1945   OZ#:[id number]    Assessment & Plan:  Primary peritoneal carcinomatosis [facility]) I have reviewed her CT imaging which show no signs of definitive cancer recurrence. Previously noted abscess collection had disappeared. Her recent tumor marker was unremarkable. Continue supportive care  History of pelvic abscess/fistula with drain placement Will defer to IR for management  MSSA bacteremia/sepsis Her port was removed. Repeat blood cultures are negative so far Appreciate ID consult; the patient may need up to 2 weeks of antibiotic therapy.  Intermittent Hypomagnesemia/hypokalemia She has chronic hypomagnesemia, likely due to poor oral intake and chronic GI loss She will continue IV magnesiumand IV potassiumreplacement therapyas needed  Mild abdominal pain Could be related to constipation.  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 She had received blood transfusion and her hemoglobin is stable.  Recent fall, weakness Recommend PT OT to evaluate while she is in the hospital. She might need skilled nursing facility placement upon discharge, however, her son is willing to take her home with home physical therapy  Goals of care discussion The patient appears to want aggressive supportive care. She is aware that she might not be able to return home to [place] upon discharge  Discharge planning Hopefully next few days  Jo Lark, MD 07/05/2018  7:57 AM   Subjective:  The patient's son, Jo Parker is present.  She feels well. She has been afebrile for almost 24 hours. She complained of mild lower abdominal pain that comes and goes.  No nausea.  She has no bowel movement for 2 days.-  Objective:  Vitals:   07/05/18 0448 07/05/18 0756  BP: 137/66 (!) 147/79  Pulse: 90 88  Resp:  14 16  Temp: 98.3 F (36.8 C) 98.2 F (36.8 C)  SpO2: 96% 96%     Intake/Output Summary (Last 24 hours) at 07/05/2018 0757 Last data filed at 07/05/2018 9629 Gross per 24 hour  Intake 2215.43 ml  Output 1975 ml  Net 240.43 ml    GENERAL:alert, no distress and comfortable SKIN: skin color, texture, turgor are normal, no rashes or significant lesions EYES: normal, Conjunctiva are pink and non-injected, sclera clear OROPHARYNX:no exudate, no erythema and lips, buccal mucosa, and tongue normal  NECK: supple, thyroid normal size, non-tender, without nodularity LYMPH:  no palpable lymphadenopathy in the cervical, axillary or inguinal LUNGS: clear to auscultation and percussion with normal breathing effort HEART: regular rate & rhythm and no murmurs and no lower extremity edema ABDOMEN:abdomen soft, minor tenderness near the drainage tube site.  No rebound or guarding Musculoskeletal:no cyanosis of digits and no clubbing  NEURO: alert & oriented x 3 with fluent speech, no focal motor/sensory deficits   Labs:  Lab Results  Component Value Date   WBC 7.0 07/05/2018   HGB 9.3 (L) 07/05/2018   HCT 30.0 (L) 07/05/2018   MCV 69.0 (L) 07/05/2018   PLT 238 07/05/2018   NEUTROABS 10.7 (H) 06/30/2018    Lab Results  Component Value Date   NA 139 07/05/2018   K 3.4 (L) 07/05/2018   CL 106 07/05/2018   CO2 25 07/05/2018    Studies:  No results found.

## 2018-07-06 ENCOUNTER — Encounter (HOSPITAL_COMMUNITY): Payer: Self-pay | Admitting: Cardiovascular Disease

## 2018-07-06 ENCOUNTER — Inpatient Hospital Stay: Payer: Self-pay

## 2018-07-06 LAB — CBC
HCT: 34.9 % — ABNORMAL LOW (ref 36.0–46.0)
Hemoglobin: 11 g/dL — ABNORMAL LOW (ref 12.0–15.0)
MCH: 21.5 pg — ABNORMAL LOW (ref 26.0–34.0)
MCHC: 31.5 g/dL (ref 30.0–36.0)
MCV: 68.3 fL — AB (ref 80.0–100.0)
NRBC: 0 % (ref 0.0–0.2)
Platelets: 312 10*3/uL (ref 150–400)
RBC: 5.11 MIL/uL (ref 3.87–5.11)
RDW: 20.7 % — ABNORMAL HIGH (ref 11.5–15.5)
WBC: 7.1 10*3/uL (ref 4.0–10.5)

## 2018-07-06 LAB — BASIC METABOLIC PANEL
Anion gap: 9 (ref 5–15)
BUN: 6 mg/dL — ABNORMAL LOW (ref 8–23)
CO2: 25 mmol/L (ref 22–32)
Calcium: 9.1 mg/dL (ref 8.9–10.3)
Chloride: 103 mmol/L (ref 98–111)
Creatinine, Ser: 0.53 mg/dL (ref 0.44–1.00)
GFR calc Af Amer: >60 mL/min (ref >60)
GFR calc non Af Amer: >60 mL/min (ref >60)
Glucose, Bld: 111 mg/dL — ABNORMAL HIGH (ref 70–99)
Potassium: 3 mmol/L — ABNORMAL LOW (ref 3.5–5.1)
Sodium: 137 mmol/L (ref 135–145)

## 2018-07-06 LAB — MAGNESIUM: Magnesium: 1.8 mg/dL (ref 1.7–2.4)

## 2018-07-06 LAB — CULTURE, BLOOD (ROUTINE X 2)
Culture: NO GROWTH
Special Requests: ADEQUATE

## 2018-07-06 MED ORDER — SODIUM CHLORIDE 0.9% FLUSH: 10.0000 mL | INTRAVENOUS | Status: DC | PRN

## 2018-07-06 MED ORDER — POTASSIUM CHLORIDE 10 MEQ/100ML IV SOLN
10.0000 meq | INTRAVENOUS | Status: AC
  Administered 2018-07-06 (×4): 10 meq via INTRAVENOUS
  Filled 2018-07-06 (×4): qty 100

## 2018-07-06 MED ORDER — POTASSIUM CHLORIDE CRYS ER 20 MEQ PO TBCR
40.0000 meq | EXTENDED_RELEASE_TABLET | Freq: Two times a day (BID) | ORAL | Status: AC
  Administered 2018-07-06 (×2): 40 meq via ORAL
  Filled 2018-07-06 (×3): qty 2

## 2018-07-06 NOTE — Progress Notes (Signed)
Physical Therapy Treatment Patient Details Name: Jo Parker MRN: [id number] DOB: 30-Jul-1945 Today's Date: 07/06/2018    History of Present Illness 73 yo female admitted with fever, weakness. Dx SIRS. Hx of peritoneal ca, Ovarian ca, malnutrittion, pelvic abscess s/p debulking sg/drain placement 03/2018, total hysterectomy 01/2018, VDRF, CHF, OA, COPD    PT Comments    Pt feeling better.  Assisted OOB to amb a great distance with walker for safety but believe pt would be fine to amb without.  Prior pt used a cane "occassionaly". Pr self able to get OOB to [facility] and self able to perform own peri care.     Follow Up Recommendations  Home health PT;SNF(pending family support)     Equipment Recommendations       Recommendations for Other Services       Precautions / Restrictions Precautions Precautions: Fall Precaution Comments: drain L side Restrictions Weight Bearing Restrictions: No    Mobility  Bed Mobility Overal bed mobility: Needs Assistance Bed Mobility: Supine to Sit;Sit to Supine     Supine to sit: Supervision Sit to supine: Supervision   General bed mobility comments: able self with increased time  Transfers Overall transfer level: Needs assistance Equipment used: None Transfers: Sit to/from [employer] Transfers Sit to Stand: Supervision;Min guard Stand pivot transfers: Supervision;Min guard       General transfer comment: assisted from bed to [facility] with good use of hands to steady self.    Ambulation/Gait Ambulation/Gait assistance: Supervision;Min guard Gait Distance (Feet): 110 Feet Assistive device: Rolling walker (2 wheeled) Gait Pattern/deviations: Step-through pattern     General Gait Details: tolerated an increased distance and present with good safety cognition, memory and tolerance.     Stairs             Wheelchair Mobility    Modified Rankin (Stroke Patients Only)       Balance                                            Cognition Arousal/Alertness: Awake/alert Behavior During Therapy: WFL for tasks assessed/performed Overall Cognitive Status: Within Functional Limits for tasks assessed                                 General Comments: improved       Exercises      General Comments        Pertinent Vitals/Pain      Home Living                      Prior Function            PT Goals (current goals can now be found in the care plan section) Progress towards PT goals: Progressing toward goals    Frequency    Min 3X/week      PT Plan Current plan remains appropriate    Co-evaluation              AM-PAC PT "6 Clicks" Mobility   Outcome Measure  Help needed turning from your back to your side while in a flat bed without using bedrails?: A Little Help needed moving from lying on your back to sitting on the side of a flat bed without using bedrails?: A Little Help needed moving to and from  a bed to a chair (including a wheelchair)?: A Little Help needed standing up from a chair using your arms (e.g., wheelchair or bedside chair)?: A Little Help needed to walk in hospital room?: A Little Help needed climbing 3-5 steps with a railing? : A Little 6 Click Score: 18    End of Session Equipment Utilized During Treatment: Gait belt Activity Tolerance: Patient tolerated treatment well Patient left: in bed;with bed alarm set;with call bell/phone within reach Nurse Communication: Mobility status PT Visit Diagnosis: Muscle weakness (generalized) (M62.81);Difficulty in walking, not elsewhere classified (R26.2)     Time: 1415-1440 PT Time Calculation (min) (ACUTE ONLY): 25 min  Charges:  $Gait Training: 8-22 mins $Therapeutic Activity: 8-22 mins                     Rica Koyanagi  PTA Acute  Rehabilitation Services Pager      [phone] Office      [phone]

## 2018-07-06 NOTE — Plan of Care (Signed)
  Problem: Health Behavior/Discharge Planning: Goal: Ability to manage health-related needs will improve Outcome: Progressing Note:  Plan for SNF v home w/ son w/ [facility] services.    Problem: Clinical Measurements: Goal: Ability to maintain clinical measurements within normal limits will improve Outcome: Progressing Goal: Will remain free from infection Outcome: Progressing Note:  Possible PICC placement today. IV ancef.  Goal: Diagnostic test results will improve Outcome: Progressing   Problem: Activity: Goal: Risk for activity intolerance will decrease Outcome: Progressing   Problem: Nutrition: Goal: Adequate nutrition will be maintained Outcome: Progressing   Problem: Pain Managment: Goal: General experience of comfort will improve Outcome: Progressing   Problem: Safety: Goal: Ability to remain free from injury will improve Outcome: Progressing   Problem: Skin Integrity: Goal: Risk for impaired skin integrity will decrease Outcome: Progressing

## 2018-07-06 NOTE — Progress Notes (Signed)
PROGRESS NOTE    Jo Parker  TDV:[id number] DOB: 10/04/1945 DOA: 06/30/2018 PCP: [name], MD    Brief Narrative:  73 y.o.femalewith medical history significant ofperitoneal carcinomatosis, ovarian cancer, malnutrition, presents with weakness. Patient has been living back at home in [place] for the past few weeks. When she was visited by her son he noted she was very weak. Patient admits to a fall about a week ago where she hit her head. At the oncology office patient had a fever and seemed a little altered. She had a low mag and was given some IV magnesium and IV fluids and referred over to the ED. She really did not complain of very much and she is quite stoic. Patient's last chemo was August per son. He states she was told she was deemed cancer free. She is anorexic and eats very little. She denies any obvious bleeding. Patient's cancer course is complicated. 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 Patient is status post debulking and pelvic drain placement October 2019. She is status post total abdominal hysterectomy with adjuvant chemo in August 2019. 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 She was found to have worsening anemia with a hemoglobin of 7.7. Her baseline appears to be around 10. She did have a temperature. She had a mildly elevated troponin but denied any chest pain. Patient had unremarkable lactic acid. UA done at the outpatient office showed small leukocytes but no bacteria and no nitrites. Patient flu was negative. Her stool was negative for blood per ED doctor notes that she had very little stool to test. Chest x-ray showed small effusion.  Assessment & Plan:   Principal  Problem:   MSSA bacteremia Active Problems:   Primary peritoneal carcinomatosis ([place])   Severe protein-calorie malnutrition ([name])   COPD (chronic obstructive pulmonary disease) ([name])   Hypomagnesemia   Hypokalemia   Anemia   Fever in adult   Weakness generalized   SIRS (systemic inflammatory response syndrome) ([name])   Sepsis due to Staphylococcus aureus ([place])  Sepsis due to MSSA bacteremia: Per report, had prolonged port access as outpatient so this is likely source. Port (suspected source) removed 1/25. No endocarditis on TEE 1/27.  - Sepsis pathophysiology has resolved.  - Narrowed abx to ancef with plans for 2 weeks course total of abx, OPAT completed.  - Blood cultures repeated 1/26 are no growth to date at 48 hours. Discussed with Dr. Baxter Flattery, ID, who recommends monitoring today, and insertion of PICC 1/29 if Cx's negative and sepsis remains resolved. Patient needs PICC line regardless as she gets intermittent IV fluids, etc. at home to prevent dehydration/readmissions. She would not be a candidate for AVF/AVG were she to experience renal failure.  - PICC placed the evening of 1/29. Given timing, will continue abx overnight and coordinate d/c in AM  Primary peritoneal carcinomatosis: No evidence of recurrence (unremarkable tumor marker), though functional decline is evident. Not undergoing active therapy.  - Follow up with oncology as scheduled  Generalized weakness, fall at home: No traumatic fractures, etc. on exam.  - Continue PT/OT efforts. Declines SNF placement at this time. Will need 24hr care with son.  - Stable at present  Severe protein calorie malnutrition: Due  to chronic illness.  - Dietitian consulted, recommending protein supplementation. BMI 18.   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 - IR evaluated drain, placed to  gravity drainage, noted ongoing fistula on imaging, advised against removing drain. F/u in 3-4 weeks for injection for evaluation. - Was due to flex sigmoidoscopy with Dr. Marcello Moores as outpatient 1/23. Will defer this for the time being as fistula remains on imaging. -Currently stable  Hypomagnesemia:  - Acute on chronic - Replaced, recheck daily  Hypokalemia: Recurrent.  - Supp and recheck - Low at 3.0. Will replace  Multifactorial anemia: Due to thalassemia, malignancy, chemotherapy.  - s/p 1u PRBCs. Continue to monitor and transfuse prn. -Stable at present  DVT prophylaxis: SCD's Code Status: Full Family Communication: Pt in room, family not at bedside Disposition Plan: Possible d/c home in 24hrs  Consultants:   ID  Procedures:   PICC 1/29  Antimicrobials: Anti-infectives (From admission, onward)   Start     Dose/Rate Route Frequency Ordered Stop   07/02/18 1345  ceFAZolin (ANCEF) IVPB 1 g/50 mL premix     1 g 100 mL/hr over 30 Minutes Intravenous  Once 07/02/18 1339 07/02/18 1415   07/02/18 1215  ceFAZolin (ANCEF) IVPB 2g/100 mL premix  Status:  Discontinued     2 g 200 mL/hr over 30 Minutes Intravenous  Once 07/02/18 1158 07/02/18 1341   07/01/18 2000  vancomycin (VANCOCIN) IVPB 750 mg/150 ml premix  Status:  Discontinued     750 mg 150 mL/hr over 60 Minutes Intravenous Every 24 hours 06/30/18 2211 07/01/18 0635   07/01/18 0645  ceFAZolin (ANCEF) IVPB 2g/100 mL premix     2 g 200 mL/hr over 30 Minutes Intravenous Every 8 hours 07/01/18 0636     07/01/18 0600  ceFEPIme (MAXIPIME) 1 g in sodium chloride 0.9 % 100 mL IVPB  Status:  Discontinued     1 g 200 mL/hr over 30 Minutes Intravenous Every 12 hours 06/30/18 2211 07/01/18 0635   06/30/18 2215  ceFEPIme (MAXIPIME) 2 g in sodium chloride 0.9 % 100 mL IVPB  Status:  Discontinued     2 g 200 mL/hr over 30 Minutes Intravenous  Once 06/30/18 2202 06/30/18 2206   06/30/18 2215  vancomycin (VANCOCIN) IVPB 1000 mg/200  mL premix  Status:  Discontinued     1,000 mg 200 mL/hr over 60 Minutes Intravenous  Once 06/30/18 2202 06/30/18 2206   06/30/18 1615  ceFEPIme (MAXIPIME) 2 g in sodium chloride 0.9 % 100 mL IVPB     2 g 200 mL/hr over 30 Minutes Intravenous  Once 06/30/18 1608 06/30/18 1759   06/30/18 1615  metroNIDAZOLE (FLAGYL) IVPB 500 mg  Status:  Discontinued     500 mg 100 mL/hr over 60 Minutes Intravenous Every 8 hours 06/30/18 1608 07/01/18 0635   06/30/18 1615  vancomycin (VANCOCIN) IVPB 1000 mg/200 mL premix     1,000 mg 200 mL/hr over 60 Minutes Intravenous  Once 06/30/18 1608 06/30/18 2052       Subjective: Eager to be discharged  Objective: Vitals:   07/06/18 0428 07/06/18 0754 07/06/18 1257 07/06/18 1724  BP: (!) 160/73 (!) 141/87 (!) 145/85 138/81  Pulse: 100 (!) 104 90 91  Resp: 18  16   Temp: 98 F (36.7 C) 98 F (36.7 C) 99.6 F (37.6 C)   TempSrc: Oral Oral Oral  SpO2: 95% 96% 98%   Weight:      Height:        Intake/Output Summary (Last 24 hours) at 07/06/2018 1738 Last data filed at 07/06/2018 1728 Gross per 24 hour  Intake 3279.2 ml  Output 2765 ml  Net 514.2 ml   Filed Weights   06/30/18 1514 07/01/18 2138 07/04/18 2021  Weight: 46.4 kg 49.4 kg 48 kg    Examination:  General exam: Appears calm and comfortable  Respiratory system: Clear to auscultation. Respiratory effort normal. Cardiovascular system: S1 & S2 heard, RRR Gastrointestinal system: Abdomen is nondistended, soft and nontender. No organomegaly or masses felt. Normal bowel sounds heard. Central nervous system: Alert and oriented. No focal neurological deficits. Extremities: Symmetric 5 x 5 power. Skin: No rashes, lesions Psychiatry: Judgement and insight appear normal. Mood & affect appropriate.   Data Reviewed: I have personally reviewed following labs and imaging studies  CBC: Recent Labs  Lab 06/30/18 0924 06/30/18 1704  07/02/18 0500 07/03/18 0639 07/04/18 0316 07/05/18 0542  07/06/18 0621  WBC 9.2 12.7*   < > 13.6* 10.7* 7.7 7.0 7.1  NEUTROABS 6.7 10.7*  --   --   --   --   --   --   HGB 11.5* 7.7*   < > 10.2* 9.8* 9.2* 9.3* 11.0*  HCT 36.9 24.9*   < > 31.9* 30.8* 29.8* 30.0* 34.9*  MCV 64.1* 66.4*   < > 66.9* 66.4* 69.0* 69.0* 68.3*  PLT 245 211   < > 188 205 206 238 312   < > = values in this interval not displayed.   Basic Metabolic Panel: Recent Labs  Lab 07/02/18 0500 07/03/18 0639 07/04/18 0316 07/05/18 0542 07/06/18 0621  NA 135 137 136 139 137  K 3.0* 3.1* 4.2 3.4* 3.0*  CL 104 104 105 106 103  CO2 23 24 24 25 25   GLUCOSE 93 109* 109* 105* 111*  BUN 8 8 9 9  6*  CREATININE 0.52 0.66 0.59 0.50 0.53  CALCIUM 8.2* 8.3* 8.4* 8.5* 9.1  MG 1.5* 1.6* 1.8 1.7 1.8   GFR: Estimated Creatinine Clearance: 47.5 mL/min (by C-G formula based on SCr of 0.53 mg/dL). Liver Function Tests: Recent Labs  Lab 06/30/18 0924 06/30/18 1704 07/02/18 0500  AST 54* 56* 37  ALT 36 34 28  ALKPHOS 111 77 78  BILITOT 1.1 1.3* 0.8  PROT 9.2* 7.2 6.7  ALBUMIN 3.4* 3.0* 2.6*   No results for input(s): LIPASE, AMYLASE in the last 168 hours. No results for input(s): AMMONIA in the last 168 hours. Coagulation Profile: Recent Labs  Lab 06/30/18 1704 07/02/18 0500  INR 1.54 1.54   Cardiac Enzymes: Recent Labs  Lab 06/30/18 1705 06/30/18 2210 07/01/18 0852  TROPONINI 0.05* 0.05* 0.04*   BNP (last 3 results) No results for input(s): PROBNP in the last 8760 hours. HbA1C: No results for input(s): HGBA1C in the last 72 hours. CBG: No results for input(s): GLUCAP in the last 168 hours. Lipid Profile: No results for input(s): CHOL, HDL, LDLCALC, TRIG, CHOLHDL, LDLDIRECT in the last 72 hours. Thyroid Function Tests: No results for input(s): TSH, T4TOTAL, FREET4, T3FREE, THYROIDAB in the last 72 hours. Anemia Panel: No results for input(s): VITAMINB12, FOLATE, FERRITIN, TIBC, IRON, RETICCTPCT in the last 72 hours. Sepsis Labs: Recent Labs  Lab  06/30/18 1704 06/30/18 2210 07/02/18 0500  LATICACIDVEN 0.9 1.0 1.0    Recent Results (from the past 240 hour(s))  Urine Culture     Status:  Abnormal   Collection Time: 06/30/18  2:13 PM  Result Value Ref Range Status   Specimen Description   Final    URINE, CLEAN CATCH Performed at [facility], [place] [address] [place], [place] [address]    Special Requests   Final    NONE Performed at [facility], [place] [address], [place], [place] [address]    Culture MULTIPLE SPECIES PRESENT, SUGGEST RECOLLECTION (A)  Final   Report Status 07/01/2018 FINAL  Final  Culture, Blood     Status: Abnormal   Collection Time: 06/30/18  2:39 PM  Result Value Ref Range Status   Specimen Description   Final    BLOOD LEFT ANTECUBITAL Performed at [facility], [place] [address] [place], [place] [address]    Special Requests   Final    BOTTLES DRAWN AEROBIC ONLY Blood Culture results may not be optimal due to an inadequate volume of blood received in culture bottles   Culture  Setup Time   Final    GRAM POSITIVE COCCI AEROBIC BOTTLE ONLY CRITICAL RESULT CALLED TO, READ BACK BY AND VERIFIED WITH: B GREEN PHARMD [id number] 07/01/2018 A BROWNING Performed at [facility], [place] [address], [place],  [address]    Culture STAPHYLOCOCCUS AUREUS (A)  Final   Report Status 07/03/2018 FINAL  Final   Organism ID, Bacteria STAPHYLOCOCCUS AUREUS  Final      Susceptibility   Staphylococcus aureus - MIC*    CIPROFLOXACIN <=0.5 SENSITIVE Sensitive     ERYTHROMYCIN RESISTANT Resistant     GENTAMICIN <=0.5 SENSITIVE Sensitive     OXACILLIN 0.5 SENSITIVE Sensitive     TETRACYCLINE <=1 SENSITIVE Sensitive     VANCOMYCIN <=0.5 SENSITIVE Sensitive     TRIMETH/SULFA <=10 SENSITIVE Sensitive     CLINDAMYCIN RESISTANT Resistant     RIFAMPIN <=0.5 SENSITIVE Sensitive     Inducible Clindamycin POSITIVE Resistant     * STAPHYLOCOCCUS AUREUS   Blood Culture ID Panel (Reflexed)     Status: Abnormal   Collection Time: 06/30/18  2:39 PM  Result Value Ref Range Status   Enterococcus species NOT DETECTED NOT DETECTED Final   Listeria monocytogenes NOT DETECTED NOT DETECTED Final   Staphylococcus species DETECTED (A) NOT DETECTED Final    Comment: CRITICAL RESULT CALLED TO, READ BACK BY AND VERIFIED WITH: B GREEN PHARMD 0610 07/01/2018 A BROWNING    Staphylococcus aureus (BCID) DETECTED (A) NOT DETECTED Final    Comment: Methicillin (oxacillin) susceptible Staphylococcus aureus (MSSA). Preferred therapy is anti staphylococcal beta lactam antibiotic (Cefazolin or Nafcillin), unless clinically contraindicated. CRITICAL RESULT CALLED TO, READ BACK BY AND VERIFIED WITH: B GREEN PHARM 0610 07/01/2018 A BROWNING    Methicillin resistance NOT DETECTED NOT DETECTED Final   Streptococcus species NOT DETECTED NOT DETECTED Final   Streptococcus agalactiae NOT DETECTED NOT DETECTED Final   Streptococcus pneumoniae NOT DETECTED NOT DETECTED Final   Streptococcus pyogenes NOT DETECTED NOT DETECTED Final   Acinetobacter baumannii NOT DETECTED NOT DETECTED Final   Enterobacteriaceae species NOT DETECTED NOT DETECTED Final   Enterobacter cloacae complex NOT DETECTED NOT DETECTED Final   Escherichia coli NOT DETECTED NOT DETECTED Final   Klebsiella oxytoca NOT DETECTED NOT DETECTED Final   Klebsiella pneumoniae NOT DETECTED NOT DETECTED Final   Proteus species NOT DETECTED NOT DETECTED Final   Serratia marcescens NOT DETECTED NOT DETECTED Final   Haemophilus influenzae NOT DETECTED NOT DETECTED Final   Neisseria meningitidis  NOT DETECTED NOT DETECTED Final   Pseudomonas aeruginosa NOT DETECTED NOT DETECTED Final   Candida albicans NOT DETECTED NOT DETECTED Final   Candida glabrata NOT DETECTED NOT DETECTED Final   Candida krusei NOT DETECTED NOT DETECTED Final   Candida parapsilosis NOT DETECTED NOT DETECTED Final   Candida tropicalis NOT  DETECTED NOT DETECTED Final    Comment: Performed at [facility], [place] [address], [place], [place] [address]  Culture, blood (Routine x 2)     Status: Abnormal   Collection Time: 06/30/18  5:15 PM  Result Value Ref Range Status   Specimen Description   Final    BLOOD PORT Performed at [place] [address], [place], [place] [address]    Special Requests   Final    BOTTLES DRAWN AEROBIC AND ANAEROBIC Blood Culture results may not be optimal due to an inadequate volume of blood received in culture bottles Performed at [place] [address], [place], [place] [address]    Culture  Setup Time   Final    GRAM POSITIVE COCCI IN CLUSTERS IN BOTH AEROBIC AND ANAEROBIC BOTTLES CRITICAL RESULT CALLED TO, READ BACK BY AND VERIFIED WITH: Shelda Jakes PHARMD, AT 0820 07/01/18 BY D. VANHOOK    Culture (A)  Final    STAPHYLOCOCCUS AUREUS SUSCEPTIBILITIES PERFORMED ON PREVIOUS CULTURE WITHIN THE LAST 5 DAYS. Performed at [facility], [place] [address], [place], [place] [address]    Report Status 07/03/2018 FINAL  Final  Culture, blood (Routine x 2)     Status: None   Collection Time: 06/30/18 10:10 PM  Result Value Ref Range Status   Specimen Description   Final    BLOOD RIGHT WRIST Performed at [place] [address] [place], [place] [address]    Special Requests   Final    BOTTLES DRAWN AEROBIC AND ANAEROBIC Blood Culture adequate volume Performed at [place] [address] [place], [place] [address]    Culture   Final    NO GROWTH 5 DAYS Performed at [facility], [place] [address] [place], [place] [address]    Report Status 07/06/2018 FINAL  Final  Urine culture     Status: None   Collection Time: 07/01/18  2:57 PM  Result Value Ref Range Status   Specimen Description URINE, RANDOM  Final   Special Requests   Final    NONE Performed at [facility], [place] [address] [place], [place] [address]    Culture NO GROWTH  Final   Report Status 07/02/2018 FINAL  Final  MRSA PCR Screening     Status: None   Collection Time: 07/01/18 11:26 PM  Result Value Ref Range Status   MRSA by PCR NEGATIVE NEGATIVE Final    Comment:        The GeneXpert MRSA Assay (FDA approved for NASAL specimens only), is one component of a comprehensive MRSA colonization surveillance program. It is not intended to diagnose MRSA infection nor to guide or monitor treatment for MRSA infections. Performed at [facility], [place] [address], [place],  [address]   Culture, blood (routine x 2)     Status: None (Preliminary result)   Collection Time: 07/03/18  6:39 AM  Result Value Ref Range Status   Specimen Description   Final    BLOOD RIGHT HAND Performed at [place] [address] [place], [place]  [address]    Special Requests   Final    BOTTLES DRAWN AEROBIC ONLY Blood Culture adequate volume Performed at [place] [address] [place], [place] [address]    Culture   Final    NO GROWTH 3 DAYS Performed at [facility], [place] [address], [place], [place] [address]    Report Status PENDING  Incomplete  Culture, blood (routine x 2)     Status: None (Preliminary result)   Collection Time: 07/03/18  6:39 AM  Result Value Ref Range Status   Specimen Description   Final    BLOOD LEFT HAND Performed at [place] [address] [place], [place] [address]    Special Requests   Final    BOTTLES DRAWN AEROBIC ONLY Blood Culture adequate volume Performed at [place] [address], [place],  [address]    Culture   Final    NO GROWTH 3 DAYS Performed at [facility], [place] [address], [place],  [address]    Report Status PENDING  Incomplete     Radiology Studies: [name] Ekg Site Rite  Result Date: 07/06/2018 If Site Rite  image not attached, placement could not be confirmed due to current cardiac rhythm.   Scheduled Meds: . carvedilol  12.5 mg Oral BID WC  . lactose free nutrition  237 mL Oral TID WC  . magnesium oxide  400 mg Oral Daily  . mirtazapine  30 mg Oral QHS  . multivitamin with minerals  1 tablet Oral Daily  . polyethylene glycol  17 g Oral Daily  . potassium chloride  40 mEq Oral BID   Continuous Infusions: . sodium chloride 100 mL/hr at 07/06/18 1400  .  ceFAZolin (ANCEF) IV 2 g (07/06/18 1727)     LOS: 5 days   Marylu Lund, MD [facility] Pager On Amion  If 7PM-7AM, please contact night-coverage 07/06/2018, 5:38 PM

## 2018-07-06 NOTE — Progress Notes (Signed)
[facility] will provide [facility] services and [facility] nursing services at DC for home IV ABX and IV hydration if ordered by Dr. Alvy Bimler. [facility] is prepared for DC today if pt is ready.  If patient discharges after hours, please call [phone].   [name] 07/06/2018, 9:53 AM

## 2018-07-06 NOTE — Progress Notes (Signed)
PHARMACY CONSULT NOTE FOR:  OUTPATIENT  PARENTERAL ANTIBIOTIC THERAPY (OPAT)  Indication: MSSA bacteria Regimen: Ancef 2g IV q8h End date: 07/17/18  IV antibiotic discharge orders are pended. To discharging provider:  please sign these orders via discharge navigator,  Select New Orders & click on the button choice - Manage This Unsigned Work.     Thank you for allowing pharmacy to be a part of this patient's care.  Peggyann Juba, PharmD, BCPS Pager: [phone] 07/06/2018, 9:59 AM

## 2018-07-06 NOTE — Progress Notes (Signed)
Nutrition Follow-up  DOCUMENTATION CODES:   (Will assess for malnutrition at follow-up.)  INTERVENTION:  - Continue Boost Plus TID, each supplement provides 360 kcal and 14 grams of protein. - Continue to encourage PO intakes.   NUTRITION DIAGNOSIS:   Moderate Malnutrition related to chronic illness, cancer and cancer related treatments as evidenced by mild fat depletion, moderate muscle depletion. -revised  GOAL:   Patient will meet greater than or equal to 90% of their needs -unmet  MONITOR:   PO intake, Supplement acceptance, Weight trends, Labs, Skin  ASSESSMENT:   73 y.o. female with medical history significant of peritoneal carcinomatosis, ovarian cancer, malnutrition. She presented to the ED with weakness. Patient's last chemo was August, per son. She is anorexic and eats very little.  Weight +4 lb since admission; could be d/t IVF. Unable to see patient x2 attempts on 1/24 but able to talk with patient today. She was sleeping when RD entered the room but awoke to name call x3. She was drowsy throughout discussion. No family/visitors at bedside.   Per chart review, patient consumed 50% of breakfast and 20% of lunch yesterday (total of 238 kcal and 10 grams of protein; 50% of breakfast today (227 kcal and 4.5 grams of protein). Patient confirms eating ~50% of breakfast today.  She states decreased appetite which has been ongoing since she started chemo. She reports that despite finishing chemo, her appetite has not returned/improved. She reports living at home with her son and granddaughter and that her granddaughter is a good cook and that both family members encourage her to eat.   Patient reports that she has intermittent abdominal pain and vomiting. She is not experiencing abdominal pain today but reports vomiting x1 this AM. She does not feel that these symptoms are associated with PO intakes. Boost Plus is ordered and she has accepted 5 of 7 cartons offered to her.     Medications reviewed; 400 mg Mag-ox/day, 30 mg remeron/day, daily multivitamin with minerals, 1 packet miralax/day, 10 mEq IV KCl x4 runs 1/29 and x2 runs 1/28, 40 mEq K-Dur x2 doses 1/29. Labs reviewed; K: 3 mmol/l, BUN: 6 mg/dl. IVF; NS @ 100 ml/hr.     NUTRITION - FOCUSED PHYSICAL EXAM:    Most Recent Value  Orbital Region  Mild depletion  Upper Arm Region  Mild depletion  Thoracic and Lumbar Region  Unable to assess  Buccal Region  Mild depletion  Temple Region  Moderate depletion  Clavicle Bone Region  Moderate depletion  Clavicle and Acromion Bone Region  Moderate depletion  Scapular Bone Region  Unable to assess  Dorsal Hand  Mild depletion  Patellar Region  Unable to assess  Anterior Thigh Region  Unable to assess  Posterior Calf Region  Unable to assess  Edema (RD Assessment)  Unable to assess  Hair  Reviewed  Eyes  Reviewed  Mouth  Reviewed  Skin  Reviewed  Nails  Reviewed       Diet Order:   Diet Order            Diet Heart Room service appropriate? Yes; Fluid consistency: Thin  Diet effective now              EDUCATION NEEDS:   No education needs have been identified at this time  Skin:  Skin Assessment: Skin Integrity Issues: Skin Integrity Issues:: Incisions Incisions: R chest and abdomen (1/28)  Last BM:  1/29  Height:   Ht Readings from Last 1 Encounters:  07/04/18  5\' 3"  (1.6 m)    Weight:   Wt Readings from Last 1 Encounters:  07/04/18 48 kg    Ideal Body Weight:  52.27 kg  BMI:  Body mass index is 18.75 kg/m.  Estimated Nutritional Needs:   Kcal:  1625-1855 kcal  Protein:  70-80 grams  Fluid:  >/= 1.8 L/day     Jarome Matin, MS, RD, LDN, [facility] Inpatient Clinical Dietitian Pager # [phone] After hours/weekend pager # [phone]

## 2018-07-06 NOTE — Progress Notes (Signed)
Jo Parker   DOB:1946-05-13   PF#:[id number]    Assessment & Plan:   Primary peritoneal carcinomatosis [facility]) I have reviewed her CT imaging which show no signs of definitive cancer recurrence. Previously noted abscess collection had disappeared. Her recent tumor marker was unremarkable. Continue supportive care She has appointment to see her GYN surgeon on July 21, 2018.  History of pelvic abscess/fistula with drain placement Will defer to IR for management.  She has another evaluation by IR on 07/19/2018.  MSSA bacteremia/sepsis Her port was removed. Repeat blood cultures are negative so far Appreciate ID consult;  I recommend PICC line removal after antibiotics are completed Previously, she was established with advanced home care service.  I recommend advanced home care service coordinate with infectious disease team for orders  Intermittent Hypomagnesemia/hypokalemia She has chronic hypomagnesemia, likely due to poor oral intake and chronic GI loss She will continue IV magnesiumand IV potassiumreplacement therapyas needed  Mild abdominal pain, resolved Could be related to constipation.  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 She had received blood transfusion and her hemoglobin is stable.  Recent fall, weakness Recommend PT OT to evaluate while she is in the hospital. She might need skilled nursing facility placement upon discharge, however, her son is willing to take her home with home physical therapy  Goals of care discussion The patient appears to want aggressive supportive care. She is aware that she might not be able to return home to [place] upon discharge  Discharge planning Hopefully next few days  I will sign off.  Please call if questions arise. I will coordinate future  appointment with her GYN surgeon  [name], MD 07/06/2018  7:45 AM   Subjective:  She feels good today.  Denies constipation.  No nausea.  No fever documented.  Objective:  Vitals:   07/06/18 0341 07/06/18 0428  BP: (!) 145/79 (!) 160/73  Pulse: 92 100  Resp:  18  Temp: 99.1 F (37.3 C) 98 F (36.7 C)  SpO2:  95%     Intake/Output Summary (Last 24 hours) at 07/06/2018 0745 Last data filed at 07/06/2018 5329 Gross per 24 hour  Intake 3026.66 ml  Output 3640 ml  Net -613.34 ml    GENERAL:alert, no distress and comfortable NEURO: alert & oriented x 3 with fluent speech, no focal motor/sensory deficits   Labs:  Lab Results  Component Value Date   WBC 7.1 07/06/2018   HGB 11.0 (L) 07/06/2018   HCT 34.9 (L) 07/06/2018   MCV 68.3 (L) 07/06/2018   PLT 312 07/06/2018   NEUTROABS 10.7 (H) 06/30/2018    Lab Results  Component Value Date   NA 137 07/06/2018   K 3.0 (L) 07/06/2018   CL 103 07/06/2018   CO2 25 07/06/2018    Studies:  No results found.

## 2018-07-06 NOTE — Progress Notes (Signed)
Pt. Had a temp. Of 100.1 and a BP of 196/96. PRN Tylenol and and Hydralazine was administered. Pt. Is stable and will continue to monitor.

## 2018-07-06 NOTE — Progress Notes (Signed)
Peripherally Inserted Central Catheter/Midline Placement  The IV Nurse has discussed with the patient and/or persons authorized to consent for the patient, the purpose of this procedure and the potential benefits and risks involved with this procedure.  The benefits include less needle sticks, lab draws from the catheter, and the patient may be discharged home with the catheter. Risks include, but not limited to, infection, bleeding, blood clot (thrombus formation), and puncture of an artery; nerve damage and irregular heartbeat and possibility to perform a PICC exchange if needed/ordered by physician.  Alternatives to this procedure were also discussed.  Bard Power PICC patient education guide, fact sheet on infection prevention and patient information card has been provided to patient /or left at bedside.    PICC/Midline Placement Documentation  PICC Single Lumen 70/17/79 PICC Left Basilic 40 cm 0 cm (Active)  Indication for Insertion or Continuance of Line Home intravenous therapies (PICC only) 07/06/2018  5:15 PM  Exposed Catheter (cm) 0 cm 07/06/2018  5:15 PM  Site Assessment Clean;Dry;Intact 07/06/2018  5:15 PM  Line Status Flushed;Saline locked;Blood return noted 07/06/2018  5:15 PM  Dressing Type Transparent 07/06/2018  5:15 PM  Dressing Status Clean;Dry;Intact 07/06/2018  5:15 PM  Dressing Intervention New dressing 07/06/2018  5:15 PM  Dressing Change Due 07/13/18 07/06/2018  5:15 PM       Colbin Jovel, Nicolette Bang 07/06/2018, 5:16 PM

## 2018-07-06 NOTE — Discharge Instructions (Signed)
Bacteremia, Adult  Bacteremia is the presence of bacteria in the blood. When bacteria enter the bloodstream, they can cause a life-threatening reaction called sepsis, which is a medical emergency. Bacteremia can spread to other parts of the body, including the heart, joints, and brain.  What are the causes?  This condition is caused by bacteria that get into the blood.  · Bacteria can enter the blood:  ? From a skin infection or injury, such as a burn or a cut.  ? From a lung infection (pneumonia).  ? From an infection in your stomach or intestines (gastrointestinal infection).  ? From an infection in your bladder or urinary system (urinary tract infection).  ? During a dental or medical procedure.  ? From bleeding gums.  ? When a bacterial infection in another part of your body spreads to your blood.  ? Through an unclean (contaminated) needle.  What increases the risk?  This condition is more likely to develop in children, the elderly, and people who:  · Have a long-term (chronic) disease or condition like diabetes or chronic kidney failure.  · Have an artificial joint or heart valve.  · Have heart valve disease.  · Have a tube inserted to treat a medical condition, such as a urinary catheter or IV.  · Have a weak disease-fighting system (immune system).  · Inject illegal drugs.  · Have been hospitalized for more than 10 days in a row.  What are the signs or symptoms?  Symptoms of this condition include:  · Fever.  · Chills.  · Fast heartbeat.  · Shortness of breath.  · Dizziness.  · Weakness.  · Confusion.  · Nausea or vomiting.  · Diarrhea.  · Low blood pressure.  · Decreased urine output.  Bacteremia that has spread to other parts of the body may cause symptoms in those areas. In some cases, there are no symptoms.  How is this diagnosed?  This condition may be diagnosed with a physical exam and tests, such as:  · A complete blood count (CBC). This test checks for signs of infection.  · Blood cultures. These  check for bacteria in your blood.  · Tests of any tubes that you have had inserted. These tests check for a source of infection.  · Urine tests, including urine cultures. These check for bacteria in the urine that could be a source of infection.  · Imaging tests, such as an X-ray, CT scan, MRI, or heart ultrasound. These check for a source of infection in other parts of your body, such as your lungs, heart valves, or joints.  How is this treated?  This condition is usually treated in the hospital. Treatment may involve:  · Antibiotic medicines. These may be given by mouth (orally) or directly into your blood through an IV (infusion through your vein).  ? Depending on the source of infection, you may need antibiotics for several weeks.  ? At first, you may be given an antibiotic to kill most types of blood bacteria (broad-spectrum antibiotic). If your test results show that a certain kind of bacteria is causing the problem, you may be given a different antibiotic to kill that specific bacteria.  · IV fluids.  · Removing any catheter or device that could be a source of infection.  · Blood pressure and breathing support, if needed.  · Surgery to control the source or the spread of infection, such as surgery to remove an infected device, abscess, or tissue.  ·   Having follow-up visits for medicines, blood tests, and further evaluation.  Follow these instructions at home:  Medicines  · Take over-the-counter and prescription medicines only as told by your health care provider.  · If you were prescribed an antibiotic medicine, take it as told by your health care provider. Do not stop taking the antibiotic even if you start to feel better.  General instructions    · Rest as needed. Ask your health care provider when you may return to normal activities.  · Drink enough fluid to keep your urine pale yellow.  · Do not use any products that contain nicotine or tobacco, such as cigarettes and e-cigarettes. If you need help  quitting, ask your health care provider.  · Keep all follow-up visits as told by your health care provider. This is important.  How is this prevented?    · Wash your hands regularly with soap and water. If soap and water are not available, use hand sanitizer.  · You should wash your hands:  ? After using the toilet or changing a diaper.  ? Before preparing, cooking, or serving food.  ? While caring for a sick person or while visiting someone in a hospital.  ? Before and after changing bandages (dressings) over wounds.  · Clean any scrapes or cuts with soap and water and cover them with clean dressings.  · Get vaccinations as recommended by your health care provider.  · Practice good oral hygiene. Brush your teeth two times a day, and floss regularly.  · Take good care of your skin. This includes bathing and moisturizing on a regular basis.  Get help right away if you have:  · Pain.  · A fever or chills.  · Trouble breathing.  · A fast heart rate.  · Skin that is blotchy, pale, or clammy.  · Confusion.  · Weakness.  · Lack of energy (lethargy) or unusual sleepiness.  · Diarrhea.  · New symptoms that develop after treatment has started.  These symptoms may represent a serious problem that is an emergency. Do not wait to see if the symptoms will go away. Get medical help right away. Call your local emergency services (911 in the [place] Do not drive yourself to the hospital.  Summary  · Bacteremia is the presence of bacteria in the blood. When bacteria enter the bloodstream, they can cause a life-threatening reaction called sepsis.  · Some symptoms of bacteremia include fever, chills, shortness of breath, confusion, nausea or vomiting, and diarrhea.  · Tests may be done to find the source of infection that led to bacteremia. These tests may include blood tests, urine tests, and imaging tests.  · Bacteremia is usually treated with antibiotic medicines in the hospital.  · Get help right away if you have any new symptoms  that develop after treatment has started.  This information is not intended to replace advice given to you by your health care provider. Make sure you discuss any questions you have with your health care provider.  Document Released: 03/08/2006 Document Revised: 10/04/2017 Document Reviewed: 10/04/2017  Elsevier Interactive Patient Education © 2019 [employer]

## 2018-07-06 NOTE — Care Management Note (Signed)
Case Management Note  Patient Details  Name: Keyshla Tunison MRN: [id number] Date of Birth: Dec 23, 1945  Subjective/Objective: Peritoneal Ca,Pelvic abscess fistula.Awaiting PICC. [facility] already following for home iv abx;& HHRN-teaching. Patient will d/c home w/Son in [place].                    Action/Plan:d/c home w/[facility]HC/iv abx.   Expected Discharge Date:  (unknown)               Expected Discharge Plan:  [address]  In-House Referral:  Clinical Social Work  Discharge planning Services  CM Consult  Post Acute Care Choice:    Choice offered to:  Adult Children  DME Arranged:    DME Agency:     HH Arranged:  RN, PT, OT, IV Antibiotics HH Agency:  [facility]  Status of Service:  Completed, signed off  If discussed at [place] of Stay Meetings, dates discussed:    Additional Comments:  Dessa Phi, RN 07/06/2018, 1:54 PM

## 2018-07-07 LAB — BASIC METABOLIC PANEL
Anion gap: 9 (ref 5–15)
BUN: 7 mg/dL — ABNORMAL LOW (ref 8–23)
CO2: 23 mmol/L (ref 22–32)
Calcium: 8.9 mg/dL (ref 8.9–10.3)
Chloride: 105 mmol/L (ref 98–111)
Creatinine, Ser: 0.55 mg/dL (ref 0.44–1.00)
GFR calc Af Amer: >60 mL/min (ref >60)
GFR calc non Af Amer: >60 mL/min (ref >60)
Glucose, Bld: 104 mg/dL — ABNORMAL HIGH (ref 70–99)
Potassium: 4 mmol/L (ref 3.5–5.1)
Sodium: 137 mmol/L (ref 135–145)

## 2018-07-07 MED ORDER — SENNOSIDES-DOCUSATE SODIUM 8.6-50 MG PO TABS: 1.0000 | ORAL_TABLET | Freq: Every day | ORAL | 0 refills | Status: DC

## 2018-07-07 MED ORDER — HYDROCODONE-ACETAMINOPHEN 5-325 MG PO TABS: 1.0000 | ORAL_TABLET | Freq: Four times a day (QID) | ORAL | 0 refills | Status: DC | PRN

## 2018-07-07 MED ORDER — CEFAZOLIN IV (FOR PTA / DISCHARGE USE ONLY): 2.0000 g | Freq: Three times a day (TID) | INTRAVENOUS | 0 refills | Status: DC

## 2018-07-07 MED ORDER — POLYETHYLENE GLYCOL 3350 17 G PO PACK: 17.0000 g | PACK | Freq: Every day | ORAL | 0 refills | Status: DC

## 2018-07-07 NOTE — Discharge Summary (Signed)
Physician Discharge Summary  Jo Parker QMG:[id number] DOB: 09-09-1945 DOA: 06/30/2018  PCP: [name], MD  Admit date: 06/30/2018 Discharge date: 07/07/2018  Admitted From: Home Disposition:  Home  Recommendations for Outpatient Follow-up:  1. Follow up with PCP in 1-2 weeks 2. Follow up with Oncology as scheduled  [place] reviewed. No recent narcotics were prescribed. Will provide brief course for acute pain control  Home Health:Home health PT/OT   Discharge Condition:Improved CODE STATUS:Full Diet recommendation: Regular   Brief/Interim Summary: 73 y.o.femalewith medical history significant ofperitoneal carcinomatosis, ovarian cancer, malnutrition, presents with weakness. Patient has been living back at home in [place] for the past few weeks. When she was visited by her son he noted she was very weak. Patient admits to a fall about a week ago where she hit her head. At the oncology office patient had a fever and seemed a little altered. She had a low mag and was given some IV magnesium and IV fluids and referred over to the ED. She really did not complain of very much and she is quite stoic. Patient's last chemo was August per son. He states she was told she was deemed cancer free. She is anorexic and eats very little. She denies any obvious bleeding. Patient's cancer course is complicated. 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 Patient is status post debulking and pelvic drain placement October 2019. She is status post total abdominal hysterectomy with adjuvant chemo in August 2019. 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 She was found to have worsening anemia with a hemoglobin of 7.7. Her  baseline appears to be around 10. She did have a temperature. She had a mildly elevated troponin but denied any chest pain. Patient had unremarkable lactic acid. UA done at the outpatient office showed small leukocytes but no bacteria and no nitrites. Patient flu was negative. Her stool was negative for blood per ED doctor notes that she had very little stool to test. Chest x-ray showed small effusion.   Discharge Diagnoses:  Principal Problem:   MSSA bacteremia Active Problems:   Primary peritoneal carcinomatosis ([place])   Severe protein-calorie malnutrition ([name])   COPD (chronic obstructive pulmonary disease) ([name])   Hypomagnesemia   Hypokalemia   Anemia   Fever in adult   Weakness generalized   SIRS (systemic inflammatory response syndrome) ([name])   Sepsis due to Staphylococcus aureus ([place])  Sepsis due to MSSA bacteremia: Per report, had prolonged port access as outpatient so this is likely source. Port (suspected source) removed 1/25. No endocarditis on TEE 1/27.  -Sepsis pathophysiology has resolved.  -Narrowed abx to ancefwith plans for 2 weeks course total of abx, OPAT completed.  - Blood cultures repeated 1/26 are no growth to date at 48 hours. Discussed with Dr. Baxter Flattery, ID, who recommends monitoring today, and insertion of PICC 1/29 if Cx's negative and sepsis remains resolved. Patient needs PICC line regardless as she gets intermittent IV fluids, etc. at home to prevent dehydration/readmissions. She would not be a candidate for AVF/AVG were she to experience renal failure. - PICC placed the evening of 1/29. Complete abx as per OPAT. Home health arranged  Primary peritoneal carcinomatosis: No evidence of recurrence (unremarkable tumor marker), though functional decline  is evident. Not undergoing active therapy.  - Follow up with oncology as scheduled  Generalized weakness, fall at home: No traumatic fractures, etc. on exam.  - Continue PT/OT efforts. Declines SNF  placement at this time. Will need 24hr care with son.  - Stable at present  Severe protein calorie malnutrition: Due to chronic illness.  - Dietitian consulted, recommending protein supplementation. BMI 18.   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 - IR evaluated drain, placed to gravity drainage, noted ongoing fistula on imaging, advised against removing drain. F/u in 3-4 weeks for injection for evaluation. - Was due to flex sigmoidoscopy with Dr. Marcello Moores as outpatient 1/23. Will defer this for the time beingas fistula remains on imaging. -Currently stable  Hypomagnesemia:  - Acute on chronic - Replaced, recheck daily  Hypokalemia:Recurrent.  - replaced  Multifactorial anemia: Due to thalassemia, malignancy, chemotherapy.  - s/p 1u PRBCs. Continue to monitor and transfuse prn. -Stable at present   Discharge Instructions  Discharge Instructions    Home infusion instructions [facility] May follow [place] Dosing Protocol; May administer Cathflo as needed to maintain patency of vascular access device.; Flushing of vascular access device: per [facility] Protocol: 0.9% NaCl pre/post medica...   Complete by:  As directed    Instructions:  May follow [place] Dosing Protocol   Instructions:  May administer Cathflo as needed to maintain patency of vascular access device.   Instructions:  Flushing of vascular access device: per [facility] Protocol: 0.9% NaCl pre/post medication administration and prn patency; Heparin 100 u/ml, 67m for implanted ports and Heparin 10u/ml, 566mfor all other central venous catheters.   Instructions:  May follow [facility] Anaphylaxis Protocol for First Dose Administration in the home: 0.9% NaCl at 25-50 ml/hr to maintain IV access for protocol meds. Epinephrine 0.3 ml IV/IM PRN and Benadryl 25-50 IV/IM PRN s/s of  anaphylaxis.   Instructions:  [facility] Coordinator (RN) to assist per patient IV care needs in the home PRN.     Allergies as of 07/07/2018      Reactions   Oxycodone Other (See Comments)   confusion      Medication List    TAKE these medications   acetaminophen 325 MG tablet Commonly known as:  TYLENOL Take 2 tablets (650 mg total) by mouth every 6 (six) hours as needed for mild pain, fever or headache.   carvedilol 12.5 MG tablet Commonly known as:  COREG Take 12.5 mg by mouth 2 (two) times daily with a meal.   ceFAZolin  IVPB Commonly known as:  ANCEF Inject 2 g into the vein every 8 (eight) hours. Indication: MSSA bacteremia Last Day of Therapy:  07/17/18 Labs - Once weekly:  CBC/D and BMP, Labs - Every other week:  ESR and CRP   HYDROcodone-acetaminophen 5-325 MG tablet Commonly known as:  NORCO/VICODIN Take 1-2 tablets by mouth every 6 (six) hours as needed for severe pain.   lidocaine-prilocaine cream Commonly known as:  EMLA Apply 1 application topically as needed. What changed:  reasons to take this   magnesium oxide 400 MG tablet Commonly known as:  MAG-OX Take 400 mg by mouth daily.   mirtazapine 30 MG tablet Commonly known as:  REMERON Take 1 tablet (30 mg total) by mouth at bedtime.   multivitamin with minerals Tabs tablet Take 1 tablet by mouth daily.  polyethylene glycol packet Commonly known as:  MIRALAX / GLYCOLAX Take 17 g by mouth daily. Start taking on:  July 08, 2018   senna-docusate 8.6-50 MG tablet Commonly known as:  Senokot-S Take 1 tablet by mouth at bedtime for 30 days.            Home Infusion Instuctions  (From admission, onward)         Start     Ordered   07/07/18 0000  Home infusion instructions [facility] May follow [place] Dosing Protocol; May administer Cathflo as needed to maintain patency of vascular access device.; Flushing of vascular access device: per [facility] Protocol: 0.9% NaCl  pre/post medica...    Question Answer Comment  Instructions May follow [place] Dosing Protocol   Instructions May administer Cathflo as needed to maintain patency of vascular access device.   Instructions Flushing of vascular access device: per [facility] Protocol: 0.9% NaCl pre/post medication administration and prn patency; Heparin 100 u/ml, 94m for implanted ports and Heparin 10u/ml, 530mfor all other central venous catheters.   Instructions May follow [facility] Anaphylaxis Protocol for First Dose Administration in the home: 0.9% NaCl at 25-50 ml/hr to maintain IV access for protocol meds. Epinephrine 0.3 ml IV/IM PRN and Benadryl 25-50 IV/IM PRN s/s of anaphylaxis.   Instructions [facility] Infusion Coordinator (RN) to assist per patient IV care needs in the home PRN.      07/07/18 0747         Follow-up Information    Health, [facility]-Home Follow up.   Specialty:  Home Health Services Why:  [facility];HHPT/OT Contact information: [phone]        Allergies  Allergen Reactions  . Oxycodone Other (See Comments)    confusion    Consultations:  Oncology  ID  Procedures/Studies: Dg Chest 2 View  Result Date: 06/30/2018 CLINICAL DATA:  Fever.  History of uterine cancer. EXAM: CHEST - 2 VIEW COMPARISON:  March 12, 2018 FINDINGS: The right Port-A-Cath is stable. No pneumothorax. Possible mild pulmonary venous congestion. No overt edema or focal infiltrate. The cardiomediastinal silhouette is unremarkable. No pneumothorax. Probable tiny pleural effusions. IMPRESSION: Tiny pleural effusions and possible mild pulmonary venous congestion. No overt edema or focal infiltrate. Electronically Signed   By: DaDorise BullionII M.D   On: 06/30/2018 15:58   Ct Head Wo Contrast  Result Date: 06/30/2018 CLINICAL DATA:  [age]ear old female with headache and confusion following fall and head injury 1 week ago. EXAM:  CT HEAD WITHOUT CONTRAST TECHNIQUE: Contiguous axial images were obtained from the base of the skull through the vertex without intravenous contrast. COMPARISON:  03/03/2018 and prior CTs FINDINGS: Brain: No evidence of acute infarction, hemorrhage, hydrocephalus, extra-axial collection or mass lesion/mass effect. Atrophy, chronic small-vessel white matter ischemic changes and LEFT aneurysm clip again noted. Vascular: Carotid atherosclerotic calcifications again noted. Skull: No acute abnormality. LEFT temporoparietal craniotomy/craniectomy noted. Sinuses/Orbits: No acute abnormality Other: None IMPRESSION: 1. No evidence of acute intracranial abnormality 2. Atrophy and chronic small-vessel white matter ischemic changes. LEFT-sided aneurysm clip again noted. Electronically Signed   By: JeMargarette Canada.D.   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 Electronically Signed   By: JeMargarette Canada.D.   On: 07/01/2018 10:19  Result Date: 07/01/2018 CLINICAL DATA:  73 year old female with abdominal and pelvic pain, fever and nausea. Recent LEFT pelvic abscess and percutaneous drain. EXAM: CT ABDOMEN AND PELVIS WITH CONTRAST TECHNIQUE: Multidetector CT imaging of the abdomen and pelvis was performed using the standard protocol following bolus administration of intravenous contrast. CONTRAST:  54m ISOVUE-300 IOPAMIDOL (ISOVUE-300) INJECTION 61% COMPARISON:  06/13/2018 percutaneous drain replacement, 05/10/2018 CT and prior studies FINDINGS: Lower chest: Mild cardiomegaly and new small RIGHT pleural effusion noted. Hepatobiliary: The liver and gallbladder are unremarkable. No biliary dilatation. Pancreas: Unremarkable Spleen: Unremarkable Adrenals/Urinary Tract: The kidneys, adrenal glands and bladder are unremarkable except for a LEFT renal  cyst. Stomach/Bowel: There is no evidence of bowel obstruction or definite bowel wall thickening. Colonic diverticulosis noted without definite evidence of diverticulitis. 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 Aortic atherosclerotic calcifications are again noted. Reproductive: Status post hysterectomy. No adnexal masses. Other: Retroperitoneal and mesenteric edema identified. No evidence of ascites or pneumoperitoneum. A percutaneous drainage catheter within the LEFT pelvis is unchanged without residual abscess. Musculoskeletal: No acute or suspicious bony abnormalities identified. IMPRESSION: 1. New small RIGHT pleural effusion and nonspecific retroperitoneal/mesenteric edema. 2. LEFT pelvic drainage catheter again noted - no evidence of residual abscess. 3. 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 4. Cardiomegaly. 5.  Aortic Atherosclerosis (ICD10-I70.0). Electronically Signed: By: JMargarette CanadaM.D. On: 06/30/2018 19:36   Ir Catheter Tube Change  Result Date: 06/13/2018 INDICATION: 73year old female with a history of left pelvic sidewall abscess secondary to diverticulitis and a persistent fistulous communication between the catheter in the sigmoid colon. Patient presents for tube exchange and repositioning. EXAM: IR CATHETER TUBE CHANGE MEDICATIONS: None ANESTHESIA/SEDATION: None COMPLICATIONS: None immediate. PROCEDURE: Informed written consent was obtained from the patient after a thorough discussion of the procedural risks, benefits and alternatives. All questions were addressed. Maximal Sterile Barrier Technique was utilized including caps, mask, sterile gowns, sterile gloves, sterile drape, hand hygiene and skin antiseptic. A timeout was performed prior to the initiation of the procedure. A gentle hand injection of contrast material was performed. There is a persistent small fistulous communication with the adjacent sigmoid colon.  The catheter was cut and removed over a short Amplatz wire. A new 10 FPakistancook all-purpose drainage catheter was then advanced over the wire and formed. The new catheter was positioned slightly further away from the site of fistulous communication compared to the existing catheter. A gentle hand injection of contrast material was performed. No definite filling of the fistula with a low pressure hand injection. The catheter was then connected to gravity drainage and secured to the skin with an adhesive fixation device. The patient tolerated the procedure well. IMPRESSION: 1. 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 2. Successful exchange for a new French drainage catheter which was positioned slightly farther away from the site of fistulization. PLAN: Return to drain clinic on approximately February 10th (patient request) for repeat drain injection. Electronically Signed   By: HJacqulynn CadetM.D.   On: 06/13/2018 13:44   Ir Removal TAnadarko Petroleum CorporationW/ PElk Grove VillageW/o Fl Mod Sed  Result Date: 07/02/2018 INDICATION: 73year old female with bacteremia. She has been referred for port catheter removal. EXAM: REMOVAL RIGHT IJ VEIN PORT-A-CATH MEDICATIONS: 1 g Ancef; The antibiotic was administered within an appropriate time interval prior to skin puncture. ANESTHESIA/SEDATION: Moderate (conscious) sedation was employed during this procedure. A total of Versed 1.0 mg and Fentanyl 50  mcg was administered intravenously. Moderate Sedation Time: 11 minutes. The patient's level of consciousness and vital signs were monitored continuously by radiology nursing throughout the procedure under my direct supervision. FLUOROSCOPY TIME:  None COMPLICATIONS: None PROCEDURE: Informed consent was obtained from the patient following an explanation of the procedure, risks, benefits and alternatives. The patient understands, agrees and consents for the procedure. All questions were addressed. A time  out was performed prior to the initiation of the procedure. The patient was positioned in the operation suite in the supine position on a gantry. The previous scar on the right chest was generously infiltrated with 1% lidocaine for local anesthesia. Infiltration of the skin and subcutaneous tissues surrounding the port was performed. Using sharp and blunt dissection, the port apparatus and subcutaneous catheter were removed in their entirety. The port pocket was then closed with interrupted Vicryl layer and a running subcuticular with 4-0 Monocryl. The skin was sealed with Derma bond. A sterile dressing was placed. The patient tolerated the procedure well and remained hemodynamically stable throughout. No complications were encountered and no significant blood loss was encountered. IMPRESSION: Status post removal of right IJ port catheter. Signed, Dulcy Fanny. Dellia Nims, RPVI [facility] Radiology Electronically Signed   By: Corrie Mckusick D.O.   On: 07/02/2018 15:09   [name] Ekg Site Rite  Result Date: 07/06/2018 If Site Rite image not attached, placement could not be confirmed due to current cardiac rhythm.    Subjective: Eager to go home  Discharge Exam: Vitals:   07/06/18 2257 07/07/18 0456  BP: (!) 151/81 138/77  Pulse: 88 85  Resp: 16 18  Temp: 99.7 F (37.6 C) 99 F (37.2 C)  SpO2: 98% 97%   Vitals:   07/06/18 1257 07/06/18 1724 07/06/18 2257 07/07/18 0456  BP: (!) 145/85 138/81 (!) 151/81 138/77  Pulse: 90 91 88 85  Resp: '16  16 18  '$ Temp: 99.6 F (37.6 C)  99.7 F (37.6 C) 99 F (37.2 C)  TempSrc: Oral  Oral Oral  SpO2: 98%  98% 97%  Weight:      Height:        General: Pt is alert, awake, not in acute distress Cardiovascular: RRR, S1/S2 +, no rubs, no gallops Respiratory: CTA bilaterally, no wheezing, no rhonchi Abdominal: Soft, NT, ND, bowel sounds + Extremities: no edema, no cyanosis   The results of significant  diagnostics from this hospitalization (including imaging, microbiology, ancillary and laboratory) are listed below for reference.     Microbiology: Recent Results (from the past 240 hour(s))  Urine Culture     Status: Abnormal   Collection Time: 06/30/18  2:13 PM  Result Value Ref Range Status   Specimen Description   Final    URINE, CLEAN CATCH Performed at [facility], [address] [place], [place] [address]    Special Requests   Final    NONE Performed at [facility], [place] [address], [place], [place] [address]    Culture MULTIPLE SPECIES PRESENT, SUGGEST RECOLLECTION (A)  Final   Report Status 07/01/2018 FINAL  Final  Culture, Blood     Status: Abnormal   Collection Time: 06/30/18  2:39 PM  Result Value Ref Range Status   Specimen Description   Final    BLOOD LEFT ANTECUBITAL Performed at [facility], [place] [address] [place], [place] [address]    Special Requests   Final    BOTTLES  DRAWN AEROBIC ONLY Blood Culture results may not be optimal due to an inadequate volume of blood received in culture bottles   Culture  Setup Time   Final    GRAM POSITIVE COCCI AEROBIC BOTTLE ONLY CRITICAL RESULT CALLED TO, READ BACK BY AND VERIFIED WITH: B GREEN PHARMD 0370 07/01/2018 A BROWNING Performed at [facility], [place] [address] [place], [place] [address]    Culture STAPHYLOCOCCUS AUREUS (A)  Final   Report Status 07/03/2018 FINAL  Final   Organism ID, Bacteria STAPHYLOCOCCUS AUREUS  Final      Susceptibility   Staphylococcus aureus - MIC*    CIPROFLOXACIN <=0.5 SENSITIVE Sensitive     ERYTHROMYCIN RESISTANT Resistant     GENTAMICIN <=0.5 SENSITIVE Sensitive     OXACILLIN 0.5 SENSITIVE Sensitive     TETRACYCLINE <=1 SENSITIVE Sensitive     VANCOMYCIN <=0.5 SENSITIVE Sensitive     TRIMETH/SULFA <=10 SENSITIVE Sensitive     CLINDAMYCIN RESISTANT Resistant     RIFAMPIN <=0.5 SENSITIVE Sensitive      Inducible Clindamycin POSITIVE Resistant     * STAPHYLOCOCCUS AUREUS  Blood Culture ID Panel (Reflexed)     Status: Abnormal   Collection Time: 06/30/18  2:39 PM  Result Value Ref Range Status   Enterococcus species NOT DETECTED NOT DETECTED Final   Listeria monocytogenes NOT DETECTED NOT DETECTED Final   Staphylococcus species DETECTED (A) NOT DETECTED Final    Comment: CRITICAL RESULT CALLED TO, READ BACK BY AND VERIFIED WITH: B GREEN PHARMD 0610 07/01/2018 A BROWNING    Staphylococcus aureus (BCID) DETECTED (A) NOT DETECTED Final    Comment: Methicillin (oxacillin) susceptible Staphylococcus aureus (MSSA). Preferred therapy is anti staphylococcal beta lactam antibiotic (Cefazolin or Nafcillin), unless clinically contraindicated. CRITICAL RESULT CALLED TO, READ BACK BY AND VERIFIED WITH: B GREEN PHARM 0610 07/01/2018 A BROWNING    Methicillin resistance NOT DETECTED NOT DETECTED Final   Streptococcus species NOT DETECTED NOT DETECTED Final   Streptococcus agalactiae NOT DETECTED NOT DETECTED Final   Streptococcus pneumoniae NOT DETECTED NOT DETECTED Final   Streptococcus pyogenes NOT DETECTED NOT DETECTED Final   Acinetobacter baumannii NOT DETECTED NOT DETECTED Final   Enterobacteriaceae species NOT DETECTED NOT DETECTED Final   Enterobacter cloacae complex NOT DETECTED NOT DETECTED Final   Escherichia coli NOT DETECTED NOT DETECTED Final   Klebsiella oxytoca NOT DETECTED NOT DETECTED Final   Klebsiella pneumoniae NOT DETECTED NOT DETECTED Final   Proteus species NOT DETECTED NOT DETECTED Final   Serratia marcescens NOT DETECTED NOT DETECTED Final   Haemophilus influenzae NOT DETECTED NOT DETECTED Final   Neisseria meningitidis NOT DETECTED NOT DETECTED Final   Pseudomonas aeruginosa NOT DETECTED NOT DETECTED Final   Candida albicans NOT DETECTED NOT DETECTED Final   Candida glabrata NOT DETECTED NOT DETECTED Final   Candida krusei NOT DETECTED NOT DETECTED Final   Candida  parapsilosis NOT DETECTED NOT DETECTED Final   Candida tropicalis NOT DETECTED NOT DETECTED Final    Comment: Performed at [facility], [place] [address], [place], [place] [address]  Culture, blood (Routine x 2)     Status: Abnormal   Collection Time: 06/30/18  5:15 PM  Result Value Ref Range Status   Specimen Description   Final    BLOOD PORT Performed at [place] [address], [place], [place] [address]    Special Requests   Final    BOTTLES DRAWN AEROBIC AND ANAEROBIC Blood Culture results may not be  optimal due to an inadequate volume of blood received in culture bottles Performed at [place] [address] [place], [place] [address]    Culture  Setup Time   Final    GRAM POSITIVE COCCI IN CLUSTERS IN BOTH AEROBIC AND ANAEROBIC BOTTLES CRITICAL RESULT CALLED TO, READ BACK BY AND VERIFIED WITH: Shelda Jakes PHARMD, AT 0820 07/01/18 BY D. VANHOOK    Culture (A)  Final    STAPHYLOCOCCUS AUREUS SUSCEPTIBILITIES PERFORMED ON PREVIOUS CULTURE WITHIN THE LAST 5 DAYS. Performed at [facility], [place] [address] [place], [place] [address]    Report Status 07/03/2018 FINAL  Final  Culture, blood (Routine x 2)     Status: None   Collection Time: 06/30/18 10:10 PM  Result Value Ref Range Status   Specimen Description   Final    BLOOD RIGHT WRIST Performed at [place] [address] [place], [place] [address]    Special Requests   Final    BOTTLES DRAWN AEROBIC AND ANAEROBIC Blood Culture adequate volume Performed at [place] [address], [place], [place] [address]    Culture   Final    NO GROWTH 5 DAYS Performed at [facility], [place] [address], [place], [place] [address]    Report Status 07/06/2018 FINAL  Final  Urine culture     Status: None   Collection Time: 07/01/18  2:57 PM  Result Value Ref Range Status   Specimen Description URINE, RANDOM  Final   Special  Requests   Final    NONE Performed at [facility], [place] [address] [place], [place] [address]    Culture NO GROWTH  Final   Report Status 07/02/2018 FINAL  Final  MRSA PCR Screening     Status: None   Collection Time: 07/01/18 11:26 PM  Result Value Ref Range Status   MRSA by PCR NEGATIVE NEGATIVE Final    Comment:        The GeneXpert MRSA Assay (FDA approved for NASAL specimens only), is one component of a comprehensive MRSA colonization surveillance program. It is not intended to diagnose MRSA infection nor to guide or monitor treatment for MRSA infections. Performed at [facility] LP, [place] [address] [place], [place] [address]   Culture, blood (routine x 2)     Status: None (Preliminary result)   Collection Time: 07/03/18  6:39 AM  Result Value Ref Range Status   Specimen Description   Final    BLOOD RIGHT HAND Performed at [facility] [address], [place], [place] [address]    Special Requests   Final    BOTTLES DRAWN AEROBIC ONLY Blood Culture adequate volume Performed at [facility] [address], [place], [place] [address]    Culture   Final    NO GROWTH 4 DAYS Performed at [facility], [place] [address] [place], [place] [address]    Report Status PENDING  Incomplete  Culture, blood (routine x 2)     Status: None (Preliminary result)   Collection Time: 07/03/18  6:39 AM  Result Value Ref Range Status   Specimen Description   Final    BLOOD LEFT HAND Performed at [facility] [address] [place], [place] [address]    Special Requests   Final    BOTTLES DRAWN AEROBIC ONLY Blood Culture adequate volume Performed at [facility]  [place], [place] [address]    Culture   Final    NO GROWTH 4 DAYS Performed at [facility], [place] [address], [place], [place] [address]    Report Status PENDING  Incomplete      Labs: BNP (last 3 results) Recent Labs    06/30/18 1705  BNP 264.1*   Basic Metabolic Panel: Recent Labs  Lab 07/02/18 0500 07/03/18 0639 07/04/18 0316 07/05/18 0542 07/06/18 0621 07/07/18 0553  NA 135 137 136 139 137 137  K 3.0* 3.1* 4.2 3.4* 3.0* 4.0  CL 104 104 105 106 103 105  CO2 '23 24 24 25 25 23  '$ GLUCOSE 93 109* 109* 105* 111* 104*  BUN '8 8 9 9 '$ 6* 7*  CREATININE 0.52 0.66 0.59 0.50 0.53 0.55  CALCIUM 8.2* 8.3* 8.4* 8.5* 9.1 8.9  MG 1.5* 1.6* 1.8 1.7 1.8  --    Liver Function Tests: Recent Labs  Lab 06/30/18 1704 07/02/18 0500  AST 56* 37  ALT 34 28  ALKPHOS 77 78  BILITOT 1.3* 0.8  PROT 7.2 6.7  ALBUMIN 3.0* 2.6*   No results for input(s): LIPASE, AMYLASE in the last 168 hours. No results for input(s): AMMONIA in the last 168 hours. CBC: Recent Labs  Lab 06/30/18 1704  07/02/18 0500 07/03/18 0639 07/04/18 0316 07/05/18 0542 07/06/18 0621  WBC 12.7*   < > 13.6* 10.7* 7.7 7.0 7.1  NEUTROABS 10.7*  --   --   --   --   --   --   HGB 7.7*   < > 10.2* 9.8* 9.2* 9.3* 11.0*  HCT 24.9*   < > 31.9* 30.8* 29.8* 30.0* 34.9*  MCV 66.4*   < > 66.9* 66.4* 69.0* 69.0* 68.3*  PLT 211   < > 188 205 206 238 312   < > = values in this interval not displayed.   Cardiac Enzymes: Recent Labs  Lab 06/30/18 1705 06/30/18 2210 07/01/18 0852  TROPONINI 0.05* 0.05* 0.04*   BNP: Invalid input(s): POCBNP CBG: No results for input(s): GLUCAP in the last 168 hours. D-Dimer No results for input(s): DDIMER in the last 72 hours. Hgb A1c No results for input(s): HGBA1C in the last 72 hours. Lipid Profile No results for input(s): CHOL, HDL, LDLCALC, TRIG, CHOLHDL, LDLDIRECT in the last 72 hours. Thyroid function studies No results for input(s): TSH, T4TOTAL, T3FREE, THYROIDAB in the last 72 hours.  Invalid input(s): FREET3 Anemia work up No results for input(s): VITAMINB12, FOLATE, FERRITIN, TIBC, IRON, RETICCTPCT in the last 72 hours. Urinalysis    Component  Value Date/Time   COLORURINE YELLOW 07/01/2018 1457   APPEARANCEUR CLEAR 07/01/2018 1457   LABSPEC 1.030 07/01/2018 1457   PHURINE 5.0 07/01/2018 1457   GLUCOSEU NEGATIVE 07/01/2018 1457   HGBUR NEGATIVE 07/01/2018 [place] 07/01/2018 [place] 07/01/2018 1457   PROTEINUR NEGATIVE 07/01/2018 1457   NITRITE NEGATIVE 07/01/2018 [place] (A) 07/01/2018 1457   Sepsis Labs Invalid input(s): PROCALCITONIN,  WBC,  LACTICIDVEN Microbiology Recent Results (from the past 240 hour(s))  Urine Culture     Status: Abnormal   Collection Time: 06/30/18  2:13 PM  Result Value Ref Range Status   Specimen Description   Final    URINE, CLEAN CATCH Performed at [facility], [place] [address], [place], [place] [address]    Special Requests   Final    NONE Performed at [facility], [place] [address] [place], [place] [address]    Culture MULTIPLE SPECIES PRESENT, SUGGEST RECOLLECTION (A)  Final   Report Status 07/01/2018 FINAL  Final  Culture, Blood     Status: Abnormal   Collection Time: 06/30/18  2:39 PM  Result Value Ref Range Status   Specimen Description   Final    BLOOD LEFT ANTECUBITAL Performed at [name], [place] [address], [place], [place] [address]    Special Requests   Final    BOTTLES DRAWN AEROBIC ONLY Blood Culture results may not be optimal due to an inadequate volume of blood received in culture bottles   Culture  Setup Time   Final    GRAM POSITIVE COCCI AEROBIC BOTTLE ONLY CRITICAL RESULT CALLED TO, READ BACK BY AND VERIFIED WITH: B GREEN PHARMD [id number] 07/01/2018 A BROWNING Performed at [facility], [place] [address], [place], [place] [address]    Culture STAPHYLOCOCCUS AUREUS (A)  Final   Report Status 07/03/2018 FINAL  Final   Organism ID, Bacteria STAPHYLOCOCCUS AUREUS  Final      Susceptibility   Staphylococcus aureus - MIC*    CIPROFLOXACIN  <=0.5 SENSITIVE Sensitive     ERYTHROMYCIN RESISTANT Resistant     GENTAMICIN <=0.5 SENSITIVE Sensitive     OXACILLIN 0.5 SENSITIVE Sensitive     TETRACYCLINE <=1 SENSITIVE Sensitive     VANCOMYCIN <=0.5 SENSITIVE Sensitive     TRIMETH/SULFA <=10 SENSITIVE Sensitive     CLINDAMYCIN RESISTANT Resistant     RIFAMPIN <=0.5 SENSITIVE Sensitive     Inducible Clindamycin POSITIVE Resistant     * STAPHYLOCOCCUS AUREUS  Blood Culture ID Panel (Reflexed)     Status: Abnormal   Collection Time: 06/30/18  2:39 PM  Result Value Ref Range Status   Enterococcus species NOT DETECTED NOT DETECTED Final   Listeria monocytogenes NOT DETECTED NOT DETECTED Final   Staphylococcus species DETECTED (A) NOT DETECTED Final    Comment: CRITICAL RESULT CALLED TO, READ BACK BY AND VERIFIED WITH: B GREEN PHARMD 0610 07/01/2018 A BROWNING    Staphylococcus aureus (BCID) DETECTED (A) NOT DETECTED Final    Comment: Methicillin (oxacillin) susceptible Staphylococcus aureus (MSSA). Preferred therapy is anti staphylococcal beta lactam antibiotic (Cefazolin or Nafcillin), unless clinically contraindicated. CRITICAL RESULT CALLED TO, READ BACK BY AND VERIFIED WITH: B GREEN [facility]ARM 0610 07/01/2018 A BROWNING    Methicillin resistance NOT DETECTED NOT DETECTED Final   Streptococcus species NOT DETECTED NOT DETECTED Final   Streptococcus agalactiae NOT DETECTED NOT DETECTED Final   Streptococcus pneumoniae NOT DETECTED NOT DETECTED Final   Streptococcus pyogenes NOT DETECTED NOT DETECTED Final   Acinetobacter baumannii NOT DETECTED NOT DETECTED Final   Enterobacteriaceae species NOT DETECTED NOT DETECTED Final   Enterobacter cloacae complex NOT DETECTED NOT DETECTED Final   Escherichia coli NOT DETECTED NOT DETECTED Final   Klebsiella oxytoca NOT DETECTED NOT DETECTED Final   Klebsiella pneumoniae NOT DETECTED NOT DETECTED Final   Proteus species NOT DETECTED NOT DETECTED Final   Serratia marcescens NOT DETECTED NOT  DETECTED Final   Haemophilus influenzae NOT DETECTED NOT DETECTED Final   Neisseria meningitidis NOT DETECTED NOT DETECTED Final   Pseudomonas aeruginosa NOT DETECTED NOT DETECTED Final   Candida albicans NOT DETECTED NOT DETECTED Final   Candida glabrata NOT DETECTED NOT DETECTED Final   Candida krusei NOT DETECTED NOT DETECTED Final   Candida parapsilosis NOT DETECTED NOT DETECTED Final   Candida tropicalis NOT DETECTED NOT DETECTED Final  Comment: Performed at [facility], [place] [address], [place], [place] [address]  Culture, blood (Routine x 2)     Status: Abnormal   Collection Time: 06/30/18  5:15 PM  Result Value Ref Range Status   Specimen Description   Final    BLOOD PORT Performed at [facility] [address], [place], [place] [address]    Special Requests   Final    BOTTLES DRAWN AEROBIC AND ANAEROBIC Blood Culture results may not be optimal due to an inadequate volume of blood received in culture bottles Performed at [facility] [address] [place], [place] [address]    Culture  Setup Time   Final    GRAM POSITIVE COCCI IN CLUSTERS IN BOTH AEROBIC AND ANAEROBIC BOTTLES CRITICAL RESULT CALLED TO, READ BACK BY AND VERIFIED WITH: Shelda Jakes PHARMD, AT 0820 07/01/18 BY D. VANHOOK    Culture (A)  Final    STAPHYLOCOCCUS AUREUS SUSCEPTIBILITIES PERFORMED ON PREVIOUS CULTURE WITHIN THE LAST 5 DAYS. Performed at [facility], [place] [address], [place], [place] [address]    Report Status 07/03/2018 FINAL  Final  Culture, blood (Routine x 2)     Status: None   Collection Time: 06/30/18 10:10 PM  Result Value Ref Range Status   Specimen Description   Final    BLOOD RIGHT WRIST Performed at [facility] [address] [place], [place] [address]    Special Requests   Final    BOTTLES DRAWN AEROBIC AND ANAEROBIC Blood Culture adequate volume Performed at [facility] [address], [place], [place] [address]    Culture   Final    NO GROWTH 5 DAYS Performed at [facility], [place] [address], [place], [place] [address]    Report Status 07/06/2018 FINAL  Final  Urine culture     Status: None   Collection Time: 07/01/18  2:57 PM  Result Value Ref Range Status   Specimen Description URINE, RANDOM  Final   Special Requests   Final    NONE Performed at [facility], [place] [address] [place], [place] [address]    Culture NO GROWTH  Final   Report Status 07/02/2018 FINAL  Final  MRSA PCR Screening     Status: None   Collection Time: 07/01/18 11:26 PM  Result Value Ref Range Status   MRSA by PCR NEGATIVE NEGATIVE Final    Comment:        The GeneXpert MRSA Assay (FDA approved for NASAL specimens only), is one component of a comprehensive MRSA colonization surveillance program. It is not intended to diagnose MRSA infection nor to guide or monitor treatment for MRSA infections. Performed at [facility], [place] [address], [place], [place] [address]   Culture, blood (routine x 2)     Status: None (Preliminary result)   Collection Time: 07/03/18  6:39 AM  Result Value Ref Range Status   Specimen Description   Final    BLOOD RIGHT HAND Performed at [facility] [address] [place], [place] [address]    Special Requests   Final    BOTTLES DRAWN AEROBIC ONLY Blood Culture adequate volume Performed at [facility] [address] [place], [place] [address]    Culture   Final    NO GROWTH 4 DAYS Performed at [facility], [place] [address] [place], [place] [address]  Report Status PENDING  Incomplete  Culture, blood (routine x 2)     Status: None (Preliminary result)   Collection Time: 07/03/18  6:39 AM  Result Value Ref Range Status   Specimen Description   Final    BLOOD LEFT HAND Performed at [facility] [address] [place], [place] [address]     Special Requests   Final    BOTTLES DRAWN AEROBIC ONLY Blood Culture adequate volume Performed at [facility] [address] [place], [place] [address]    Culture   Final    NO GROWTH 4 DAYS Performed at [facility], [place] [address], [place], [place] [address]    Report Status PENDING  Incomplete   Time spent: 74mn  SIGNED:   SMarylu Lund MD  [facility] 07/07/2018, 11:38 AM  If 7PM-7AM, please contact night-coverage

## 2018-07-08 LAB — CULTURE, BLOOD (ROUTINE X 2)
Culture: NO GROWTH
Culture: NO GROWTH
SPECIAL REQUESTS: ADEQUATE
SPECIAL REQUESTS: ADEQUATE

## 2018-07-15 ENCOUNTER — Telehealth: Payer: Self-pay | Admitting: Oncology

## 2018-07-15 NOTE — Telephone Encounter (Signed)
[name] called to check on Jo Parker's next apt for labs.  Discussed that it is on 07/19/18 at 12:15 and that the port flush appointment will be canceled since port was removed.   Asked if she still has her PICC line.  He said that she still has the PICC and that her last dose of antibiotics will be on Sunday, 07/17/18.    Also asked if they need any medication refills and he said no, they are good on everything.

## 2018-07-18 ENCOUNTER — Encounter: Payer: Self-pay | Admitting: Hematology and Oncology

## 2018-07-19 ENCOUNTER — Other Ambulatory Visit: Payer: Self-pay

## 2018-07-19 ENCOUNTER — Telehealth: Payer: Self-pay | Admitting: Oncology

## 2018-07-19 ENCOUNTER — Telehealth: Payer: Self-pay

## 2018-07-19 ENCOUNTER — Inpatient Hospital Stay: Payer: Medicare PPO | Attending: Hematology and Oncology

## 2018-07-19 ENCOUNTER — Ambulatory Visit: Payer: Medicare PPO

## 2018-07-19 ENCOUNTER — Other Ambulatory Visit: Payer: Medicare PPO

## 2018-07-19 DIAGNOSIS — C786 Secondary malignant neoplasm of retroperitoneum and peritoneum: Secondary | ICD-10-CM | POA: Insufficient documentation

## 2018-07-19 DIAGNOSIS — R410 Disorientation, unspecified: Secondary | ICD-10-CM | POA: Insufficient documentation

## 2018-07-19 DIAGNOSIS — C482 Malignant neoplasm of peritoneum, unspecified: Secondary | ICD-10-CM

## 2018-07-19 DIAGNOSIS — Z1501 Genetic susceptibility to malignant neoplasm of breast: Secondary | ICD-10-CM | POA: Insufficient documentation

## 2018-07-19 DIAGNOSIS — Z87891 Personal history of nicotine dependence: Secondary | ICD-10-CM | POA: Insufficient documentation

## 2018-07-19 DIAGNOSIS — R627 Adult failure to thrive: Secondary | ICD-10-CM | POA: Insufficient documentation

## 2018-07-19 DIAGNOSIS — Z9221 Personal history of antineoplastic chemotherapy: Secondary | ICD-10-CM | POA: Insufficient documentation

## 2018-07-19 DIAGNOSIS — C569 Malignant neoplasm of unspecified ovary: Secondary | ICD-10-CM | POA: Insufficient documentation

## 2018-07-19 DIAGNOSIS — Z90722 Acquired absence of ovaries, bilateral: Secondary | ICD-10-CM | POA: Insufficient documentation

## 2018-07-19 DIAGNOSIS — Z90710 Acquired absence of both cervix and uterus: Secondary | ICD-10-CM | POA: Insufficient documentation

## 2018-07-19 LAB — CBC WITH DIFFERENTIAL (CANCER CENTER ONLY)
Abs Immature Granulocytes: 0.01 10*3/uL (ref 0.00–0.07)
Basophils Absolute: 0 10*3/uL (ref 0.0–0.1)
Basophils Relative: 0 %
Eosinophils Absolute: 0.2 10*3/uL (ref 0.0–0.5)
Eosinophils Relative: 3 %
HCT: 33.6 % — ABNORMAL LOW (ref 36.0–46.0)
HEMOGLOBIN: 10.3 g/dL — AB (ref 12.0–15.0)
Immature Granulocytes: 0 %
LYMPHS PCT: 28 %
Lymphs Abs: 1.6 10*3/uL (ref 0.7–4.0)
MCH: 21 pg — ABNORMAL LOW (ref 26.0–34.0)
MCHC: 30.7 g/dL (ref 30.0–36.0)
MCV: 68.6 fL — ABNORMAL LOW (ref 80.0–100.0)
Monocytes Absolute: 0.5 10*3/uL (ref 0.1–1.0)
Monocytes Relative: 9 %
Neutro Abs: 3.5 10*3/uL (ref 1.7–7.7)
Neutrophils Relative %: 60 %
Platelet Count: 234 10*3/uL (ref 150–400)
RBC: 4.9 MIL/uL (ref 3.87–5.11)
RDW: 21.2 % — ABNORMAL HIGH (ref 11.5–15.5)
WBC Count: 5.7 10*3/uL (ref 4.0–10.5)
nRBC: 0 % (ref 0.0–0.2)

## 2018-07-19 LAB — CMP (CANCER CENTER ONLY)
AST: 17 U/L (ref 15–41)
Albumin: 3.2 g/dL — ABNORMAL LOW (ref 3.5–5.0)
Alkaline Phosphatase: 78 U/L (ref 38–126)
Anion gap: 11 (ref 5–15)
BUN: 12 mg/dL (ref 8–23)
CO2: 27 mmol/L (ref 22–32)
CREATININE: 0.8 mg/dL (ref 0.44–1.00)
Calcium: 9.5 mg/dL (ref 8.9–10.3)
Chloride: 102 mmol/L (ref 98–111)
GFR, Estimated: >60 mL/min (ref >60)
Glucose, Bld: 114 mg/dL — ABNORMAL HIGH (ref 70–99)
Potassium: 3.8 mmol/L (ref 3.5–5.1)
Sodium: 140 mmol/L (ref 135–145)
Total Bilirubin: 0.4 mg/dL (ref 0.3–1.2)
Total Protein: 8.5 g/dL — ABNORMAL HIGH (ref 6.5–8.1)

## 2018-07-19 LAB — MAGNESIUM: MAGNESIUM: 1.4 mg/dL — AB (ref 1.7–2.4)

## 2018-07-19 NOTE — Telephone Encounter (Signed)
Jo Parker with below message. He verbalized understanding. She has not been taking the Magnesium tablets since the last time it was checked it was normal. Will restart the Magnesium today. His Mom does not have much of appetite and has lost some weight since being in the hospital.

## 2018-07-19 NOTE — Telephone Encounter (Signed)
Called Jo Parker and advised him of rescheduled appointment for tomorrow, 07/20/18 at 9:45 at [facility].  He verbalized agreement.

## 2018-07-19 NOTE — Telephone Encounter (Signed)
-----   Message from [name], MD sent at 07/19/2018  1:46 PM EST ----- Regarding: low mag Pls call her son and make sure she is still taking oral magnesium BID ----- Message ----- From: Interface, [facility] [place] Sent: 07/19/2018  12:21 PM EST To: [name], MD

## 2018-07-19 NOTE — Telephone Encounter (Signed)
Left a message for [facility] to reschedule missed appointment for drain check today.  Patient did not know about appointment.

## 2018-07-20 ENCOUNTER — Ambulatory Visit
Admission: RE | Admit: 2018-07-20 | Discharge: 2018-07-20 | Disposition: A | Discharge status: Home / Self Care | Payer: Medicare PPO | Source: Ambulatory Visit | Attending: Gynecologic Oncology | Admitting: Gynecologic Oncology

## 2018-07-20 ENCOUNTER — Telehealth: Payer: Self-pay | Admitting: Oncology

## 2018-07-20 ENCOUNTER — Other Ambulatory Visit: Payer: Self-pay | Admitting: Gynecologic Oncology

## 2018-07-20 ENCOUNTER — Encounter: Payer: Self-pay | Admitting: Radiology

## 2018-07-20 DIAGNOSIS — N739 Female pelvic inflammatory disease, unspecified: Secondary | ICD-10-CM

## 2018-07-20 HISTORY — PX: IR RADIOLOGIST EVAL & MGMT: IMG5224

## 2018-07-20 LAB — CA 125: Cancer Antigen (CA) 125: 32.3 U/mL (ref 0.0–38.1)

## 2018-07-20 NOTE — Telephone Encounter (Signed)
Above message given to [name], NP. She will call.

## 2018-07-20 NOTE — Telephone Encounter (Signed)
Jo Parker called and said Evian had dinner with his brother last night and ate fried oysters.  Now she is having heartburn, nausea and gas pains.  She has taken Tums without relief.  He is going to take her to her appointment with IR today at 10:00 to evaluate her drain but is wondering if she needs to be seen afterwards.

## 2018-07-20 NOTE — Telephone Encounter (Signed)
I do not have availability to see her today Maybe Lenna Sciara can talk to her briefly? Typically if TUMS are not helpful she can take Maalox or we can prescribe protonix

## 2018-07-20 NOTE — Progress Notes (Signed)
Chief Complaint: F/U drain  Referring Physician(s): Alvy Bimler  Supervising Physician: Daryll Brod  History of Present Illness: Jo Parker is a 73 y.o. 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   Fistula tract was widely patent on the drain injection on 05/10/18 and the tract does appear to be getting smaller.   When she was seen by Lennette Bihari in December, he explained to the patient that it may take some time for this fistula tract to completely heal and that she would be at risk to develop a recurrent pelvic collection if we remove the drain too early.  On June 13, 2018, Dr. Laurence Ferrari exchanged the drain. His note reads= 1. 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 2. Successful exchange for a new French drainage catheter which was positioned slightly farther away from the site of fistulization.  In the meantime she had actually moved to [place] but her son brought her back here because her condition started to decline.  She was admitted to [facility] on 06/30/18 for possible sepsis.  CT scan was obtained due to the presence of the drain. There was gas and air seen at that time c/w fistula.  She is here today for drain injection to evaluate for resolution of the fistula  She is not flushing the drain and it has been to gravity bag since 07/01/2018.   Past Medical History:  Diagnosis Date  . Acute on chronic respiratory failure with hypoxia ([place]) 03/07/2018  . Anxiety and depression   . Aortic aneurysm ([place])    see ov note 03/2017   . Arthritis   . Aspiration pneumonia ([place])   . 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 CHF (congestive heart failure) ([name])    diastolic dysfunction  . COPD (chronic  obstructive pulmonary disease) ([place]) 10/23/2017  . Depression   . Family history of breast cancer   . Family history of prostate cancer   . Family history of uterine cancer   . History of diverticulitis   . HLD (hyperlipidemia) 10/23/2017  . Hypercholesterolemia   . Hypertension   . Mitral valve prolapse   . Pelvic abscess in female   . Sleep apnea    cpap machine mask and tubing in [place] per patient.  Patient does not have mask or tubing to bring on DOS of 01/27/2018.   Marland Kitchen Uterine cancer ([place])    had partial hysterectomy at 27    Past Surgical History:  Procedure Laterality Date  . ABDOMINAL HYSTERECTOMY    . cardiac monitor implant    . CEREBRAL ANEURYSM REPAIR    . CERVICAL SPINE SURGERY     bone spurs  . DEBULKING N/A 01/27/2018   Procedure: RADICAL DEBULKING;  Surgeon: [name], MD;  Location: [facility];  Service: Gynecology;  Laterality: N/A;  . IR CATHETER TUBE CHANGE  06/13/2018  . IR FLUORO GUIDE PORT INSERTION RIGHT  10/14/2017  . IR PARACENTESIS  10/14/2017  . IR RADIOLOGIST EVAL & MGMT  04/26/2018  . IR RADIOLOGIST EVAL & MGMT  05/10/2018  . IR RADIOLOGIST EVAL & MGMT  05/25/2018  . IR RADIOLOGIST EVAL & MGMT  07/20/2018  . IR REMOVAL TUN ACCESS W/ PORT W/O FL MOD SED  07/02/2018  . IR US GUIDE VASC ACCESS RIGHT  10/14/2017  .  LAPAROTOMY N/A 01/27/2018   Procedure: EXPLORATORY LAPAROTOMY WITH REMOVAL OF BILATERAL TUBES AND OVARIES;  Surgeon: [name], MD;  Location: [facility];  Service: Gynecology;  Laterality: N/A;  . LAPAROTOMY N/A 01/31/2018   Procedure: EXPLORATORY LAPAROTOMY, ABDOMINAL WASHOUT;  Surgeon: Isabel Caprice, MD;  Location: [facility];  Service: Gynecology;  Laterality: N/A;  . OMENTECTOMY N/A 01/27/2018   Procedure: OMENTECTOMY;  Surgeon: [name], MD;  Location: [facility];  Service: Gynecology;  Laterality: N/A;  . PARTIAL HYSTERECTOMY  1971   "precancer"  . TEE WITHOUT CARDIOVERSION N/A 07/04/2018   Procedure: TRANSESOPHAGEAL ECHOCARDIOGRAM (TEE);   Surgeon: Sanda Klein, MD;  Location: [facility] ENDOSCOPY;  Service: Cardiovascular;  Laterality: N/A;    Allergies: Oxycodone  Medications: Prior to Admission medications   Medication Sig Start Date End Date Taking? Authorizing Provider  acetaminophen (TYLENOL) 325 MG tablet Take 2 tablets (650 mg total) by mouth every 6 (six) hours as needed for mild pain, fever or headache. 03/04/18   Cross, Lenna Sciara D, NP  carvedilol (COREG) 12.5 MG tablet Take 12.5 mg by mouth 2 (two) times daily with a meal.    [provider]  ceFAZolin (ANCEF) IVPB Inject 2 g into the vein every 8 (eight) hours. Indication: MSSA bacteremia Last Day of Therapy:  07/17/18 Labs - Once weekly:  CBC/D and BMP, Labs - Every other week:  ESR and CRP 07/07/18   Donne Hazel, MD  HYDROcodone-acetaminophen (NORCO/VICODIN) 5-325 MG tablet Take 1-2 tablets by mouth every 6 (six) hours as needed for severe pain. 07/07/18   Donne Hazel, MD  lidocaine-prilocaine (EMLA) cream Apply 1 application topically as needed. Patient taking differently: Apply 1 application topically as needed (port access).  03/31/18   [name], MD  magnesium oxide (MAG-OX) 400 MG tablet Take 400 mg by mouth daily.    [provider]  mirtazapine (REMERON) 30 MG tablet Take 1 tablet (30 mg total) by mouth at bedtime. 05/13/18   [name], MD  Multiple Vitamin (MULTIVITAMIN WITH MINERALS) TABS tablet Take 1 tablet by mouth daily.    [provider]  polyethylene glycol (MIRALAX / GLYCOLAX) packet Take 17 g by mouth daily. 07/08/18   Donne Hazel, MD  senna-docusate (SENOKOT-S) 8.6-50 MG tablet Take 1 tablet by mouth at bedtime for 30 days. 07/07/18 08/06/18  Donne Hazel, MD     Family History  Problem Relation Age of Onset  . Endometrial cancer Sister 42       endometrial ca  . Endometrial cancer Paternal Aunt 80  . Breast cancer Paternal Aunt   . Stomach cancer Maternal Grandfather        d. 59s  . Prostate cancer  Paternal Uncle   . Breast cancer Cousin        pat first cousin  . Breast cancer Cousin        pat first cousin's daughter  . Lung cancer Father        lung ca  . Heart disease Mother   . Stroke Paternal Grandmother   . Endometrial cancer Cousin        paternal first cousin's daughter  . Breast cancer Other        niece dx less than 12s  . Breast cancer Other        niece, dx under 58    Social History   Socioeconomic History  . Marital status: Married    Spouse name: Not on file  . Number of  children: 3  . Years of education: Not on file  . Highest education level: Not on file  Occupational History  . Occupation: retired  Scientific laboratory technician  . Financial resource strain: Not on file  . Food insecurity:    Worry: Not on file    Inability: Not on file  . Transportation needs:    Medical: Not on file    Non-medical: Not on file  Tobacco Use  . Smoking status: Former Smoker    Packs/day: 1.00    Years: 55.00    Pack years: 55.00    Last attempt to quit: 10/06/2017    Years since quitting: 0.7  . Smokeless tobacco: Never Used  Substance and Sexual Activity  . Alcohol use: Never    Frequency: Never  . Drug use: Never  . Sexual activity: Not Currently  Lifestyle  . Physical activity:    Days per week: Not on file    Minutes per session: Not on file  . Stress: Not on file  Relationships  . Social connections:    Talks on phone: Not on file    Gets together: Not on file    Attends religious service: Not on file    Active member of club or organization: Not on file    Attends meetings of clubs or organizations: Not on file    Relationship status: Not on file  Other Topics Concern  . Not on file  Social History Narrative  . Not on file     Review of Systems: A 12 point ROS discussed and pertinent positives are indicated in the HPI above.  All other systems are negative.  Review of Systems  Vital Signs: BP 131/73   Pulse (!) 104   Temp (!) 97.5 F (36.4 C)    SpO2 100%   Physical Exam Constitutional:      Comments: Thin, NAD  Pulmonary:     Effort: Pulmonary effort is normal. No respiratory distress.  Abdominal:     General: Abdomen is flat.     Palpations: Abdomen is soft.     Comments: Drain still in place LLQ  Musculoskeletal: Normal range of motion.  Neurological:     General: No focal deficit present.     Mental Status: She is alert and oriented to person, place, and time.   Drain injection done today =  Dr. Annamaria Boots reviewed the images although there is some improvement, there continues to be a fistula to the colon visible.      Imaging: Dg Chest 2 View  Result Date: 06/30/2018 CLINICAL DATA:  Fever.  History of uterine cancer. EXAM: CHEST - 2 VIEW COMPARISON:  March 12, 2018 FINDINGS: The right Port-A-Cath is stable. No pneumothorax. Possible mild pulmonary venous congestion. No overt edema or focal infiltrate. The cardiomediastinal silhouette is unremarkable. No pneumothorax. Probable tiny pleural effusions. IMPRESSION: Tiny pleural effusions and possible mild pulmonary venous congestion. No overt edema or focal infiltrate. Electronically Signed   By: Dorise Bullion III M.D   On: 06/30/2018 15:58   Ct Head Wo Contrast  Result Date: 06/30/2018 CLINICAL DATA:  73 year old female with headache and confusion following fall and head injury 1 week ago. EXAM: CT HEAD WITHOUT CONTRAST TECHNIQUE: Contiguous axial images were obtained from the base of the skull through the vertex without intravenous contrast. COMPARISON:  03/03/2018 and prior CTs FINDINGS: Brain: No evidence of acute infarction, hemorrhage, hydrocephalus, extra-axial collection or mass lesion/mass effect. Atrophy, chronic small-vessel white matter ischemic changes and LEFT  aneurysm clip again noted. Vascular: Carotid atherosclerotic calcifications again noted. Skull: No acute abnormality. LEFT temporoparietal craniotomy/craniectomy noted. Sinuses/Orbits: No acute abnormality  Other: None IMPRESSION: 1. No evidence of acute intracranial abnormality 2. Atrophy and chronic small-vessel white matter ischemic changes. LEFT-sided aneurysm clip again noted. Electronically Signed   By: Margarette Canada M.D.   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 Electronically Signed   By: Margarette Canada M.D.   On: 07/01/2018 10:19   Result Date: 07/01/2018 CLINICAL DATA:  73 year old female with abdominal and pelvic pain, fever and nausea. Recent LEFT pelvic abscess and percutaneous drain. EXAM: CT ABDOMEN AND PELVIS WITH CONTRAST TECHNIQUE: Multidetector CT imaging of the abdomen and pelvis was performed using the standard protocol following bolus administration of intravenous contrast. CONTRAST:  66m ISOVUE-300 IOPAMIDOL (ISOVUE-300) INJECTION 61% COMPARISON:  06/13/2018 percutaneous drain replacement, 05/10/2018 CT and prior studies FINDINGS: Lower chest: Mild cardiomegaly and new small RIGHT pleural effusion noted. Hepatobiliary: The liver and gallbladder are unremarkable. No biliary dilatation. Pancreas: Unremarkable Spleen: Unremarkable Adrenals/Urinary Tract: The kidneys, adrenal glands and bladder are unremarkable except for a LEFT renal cyst. Stomach/Bowel: There is no evidence of bowel obstruction or definite bowel wall thickening. Colonic diverticulosis noted without definite evidence of diverticulitis. 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 Aortic atherosclerotic calcifications are again noted. Reproductive: Status post hysterectomy. No adnexal masses. Other: Retroperitoneal and mesenteric edema identified. No evidence of ascites or pneumoperitoneum. A percutaneous drainage catheter within the LEFT pelvis is unchanged without residual abscess. Musculoskeletal: No  acute or suspicious bony abnormalities identified. IMPRESSION: 1. New small RIGHT pleural effusion and nonspecific retroperitoneal/mesenteric edema. 2. LEFT pelvic drainage catheter again noted - no evidence of residual abscess. 3. 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 4. Cardiomegaly. 5.  Aortic Atherosclerosis (ICD10-I70.0). Electronically Signed: By: JMargarette CanadaM.D. On: 06/30/2018 19:36   Ir Removal TAnadarko Petroleum CorporationW/ PKathrynW/o Fl Mod Sed  Result Date: 07/02/2018 INDICATION: 73year old female with bacteremia. She has been referred for port catheter removal. EXAM: REMOVAL RIGHT IJ VEIN PORT-A-CATH MEDICATIONS: 1 g Ancef; The antibiotic was administered within an appropriate time interval prior to skin puncture. ANESTHESIA/SEDATION: Moderate (conscious) sedation was employed during this procedure. A total of Versed 1.0 mg and Fentanyl 50 mcg was administered intravenously. Moderate Sedation Time: 11 minutes. The patient's level of consciousness and vital signs were monitored continuously by radiology nursing throughout the procedure under my direct supervision. FLUOROSCOPY TIME:  None COMPLICATIONS: None PROCEDURE: Informed consent was obtained from the patient following an explanation of the procedure, risks, benefits and alternatives. The patient understands, agrees and consents for the procedure. All questions were addressed. A time out was performed prior to the initiation of the procedure. The patient was positioned in the operation suite in the supine position on a gantry. The previous scar on the right chest was generously infiltrated with 1% lidocaine for local anesthesia. Infiltration of the skin and subcutaneous tissues surrounding the port was performed. Using sharp and blunt dissection, the port apparatus and subcutaneous catheter were removed in their entirety. The port pocket was then closed with interrupted Vicryl layer and a running subcuticular with 4-0 Monocryl. The skin  was sealed with Derma bond. A sterile dressing was placed. The patient tolerated the procedure well and  remained hemodynamically stable throughout. No complications were encountered and no significant blood loss was encountered. IMPRESSION: Status post removal of right IJ port catheter. Signed, Dulcy Fanny. Dellia Nims, RPVI [facility] Electronically Signed   By: Corrie Mckusick D.O.   On: 07/02/2018 15:09   [name] Ekg Site Rite  Result Date: 07/06/2018 If Site Rite image not attached, placement could not be confirmed due to current cardiac rhythm.  Ir Radiologist Eval & Mgmt  Result Date: 07/20/2018 Please refer to notes tab for details about interventional procedure. (Op Note)   Labs:  CBC: Recent Labs    07/04/18 0316 07/05/18 0542 07/06/18 0621 07/19/18 1157  WBC 7.7 7.0 7.1 5.7  HGB 9.2* 9.3* 11.0* 10.3*  HCT 29.8* 30.0* 34.9* 33.6*  PLT 206 238 312 234    COAGS: Recent Labs    10/08/17 0710  01/31/18 0850  03/05/18 0704 04/18/18 0742 06/30/18 1704 07/02/18 0500  INR 1.22   < > 1.36   < > 1.24 1.24 1.54 1.54  APTT 32  --  36  --   --   --   --   --    < > = values in this interval not displayed.    BMP: Recent Labs    07/05/18 0542 07/06/18 0621 07/07/18 0553 07/19/18 1157  NA 139 137 137 140  K 3.4* 3.0* 4.0 3.8  CL 106 103 105 102  CO2 '25 25 23 27  '$ GLUCOSE 105* 111* 104* 114*  BUN 9 6* 7* 12  CALCIUM 8.5* 9.1 8.9 9.5  CREATININE 0.50 0.53 0.55 0.80  GFRNONAA >60 >60 >60 >60  GFRAA >60 >60 >60 >60    LIVER FUNCTION TESTS: Recent Labs    06/30/18 0924 06/30/18 1704 07/02/18 0500 07/19/18 1157  BILITOT 1.1 1.3* 0.8 0.4  AST 54* 56* 37 17  ALT 36 34 28 <6  ALKPHOS 111 77 78 78  PROT 9.2* 7.2 6.7 8.5*  ALBUMIN 3.4* 3.0* 2.6* 3.2*    TUMOR MARKERS: No results for input(s): AFPTM, CEA, CA199, CHROMGRNA in the last 8760 hours.  Assessment/Plan:  S/P drain placement 04/18/18 then exchange on  06/13/18 away from fistula.  Drain injection today shows persistent fistulous communication to the descending colon.  She should follow up with General Surgery.  Continue gravity bag and no flushes.  F/U in 3-4 weeks with drain injection unless General Surgery feels surgery is needed.   Electronically Signed: Murrell Redden PA-C 07/20/2018, 10:22 AM    Please refer to Dr. Fritz Pickerel attestation of this note for management and plan.

## 2018-07-21 ENCOUNTER — Encounter: Payer: Self-pay | Admitting: Gynecologic Oncology

## 2018-07-21 ENCOUNTER — Inpatient Hospital Stay (HOSPITAL_COMMUNITY)
Admission: EM | Admit: 2018-07-21 | Discharge: 2018-07-23 | DRG: 300 | Disposition: A | Discharge status: Other Acute Inpatient Hospital | Payer: Medicare PPO | Attending: Emergency Medicine | Admitting: Emergency Medicine

## 2018-07-21 ENCOUNTER — Other Ambulatory Visit: Payer: Self-pay

## 2018-07-21 ENCOUNTER — Emergency Department (HOSPITAL_COMMUNITY): Payer: Medicare PPO

## 2018-07-21 ENCOUNTER — Encounter (HOSPITAL_COMMUNITY): Payer: Self-pay | Admitting: Emergency Medicine

## 2018-07-21 ENCOUNTER — Inpatient Hospital Stay (HOSPITAL_BASED_OUTPATIENT_CLINIC_OR_DEPARTMENT_OTHER): Payer: Medicare PPO | Admitting: Gynecologic Oncology

## 2018-07-21 VITALS — BP 104/57 | HR 106 | Temp 98.4°F | Resp 20 | Wt 98.8 lb

## 2018-07-21 DIAGNOSIS — C482 Malignant neoplasm of peritoneum, unspecified: Secondary | ICD-10-CM | POA: Diagnosis present

## 2018-07-21 DIAGNOSIS — R1030 Lower abdominal pain, unspecified: Secondary | ICD-10-CM | POA: Diagnosis not present

## 2018-07-21 DIAGNOSIS — I714 Abdominal aortic aneurysm, without rupture: Principal | ICD-10-CM | POA: Diagnosis present

## 2018-07-21 DIAGNOSIS — D569 Thalassemia, unspecified: Secondary | ICD-10-CM | POA: Diagnosis present

## 2018-07-21 DIAGNOSIS — R05 Cough: Secondary | ICD-10-CM

## 2018-07-21 DIAGNOSIS — R410 Disorientation, unspecified: Secondary | ICD-10-CM

## 2018-07-21 DIAGNOSIS — R7881 Bacteremia: Secondary | ICD-10-CM | POA: Diagnosis present

## 2018-07-21 DIAGNOSIS — R627 Adult failure to thrive: Secondary | ICD-10-CM | POA: Diagnosis not present

## 2018-07-21 DIAGNOSIS — J449 Chronic obstructive pulmonary disease, unspecified: Secondary | ICD-10-CM | POA: Diagnosis present

## 2018-07-21 DIAGNOSIS — Z87891 Personal history of nicotine dependence: Secondary | ICD-10-CM

## 2018-07-21 DIAGNOSIS — F419 Anxiety disorder, unspecified: Secondary | ICD-10-CM | POA: Diagnosis present

## 2018-07-21 DIAGNOSIS — G4733 Obstructive sleep apnea (adult) (pediatric): Secondary | ICD-10-CM | POA: Diagnosis present

## 2018-07-21 DIAGNOSIS — Z8042 Family history of malignant neoplasm of prostate: Secondary | ICD-10-CM

## 2018-07-21 DIAGNOSIS — C569 Malignant neoplasm of unspecified ovary: Secondary | ICD-10-CM

## 2018-07-21 DIAGNOSIS — Z9689 Presence of other specified functional implants: Secondary | ICD-10-CM | POA: Diagnosis present

## 2018-07-21 DIAGNOSIS — Z9079 Acquired absence of other genital organ(s): Secondary | ICD-10-CM

## 2018-07-21 DIAGNOSIS — Z90722 Acquired absence of ovaries, bilateral: Secondary | ICD-10-CM

## 2018-07-21 DIAGNOSIS — Z79899 Other long term (current) drug therapy: Secondary | ICD-10-CM

## 2018-07-21 DIAGNOSIS — Z803 Family history of malignant neoplasm of breast: Secondary | ICD-10-CM

## 2018-07-21 DIAGNOSIS — R109 Unspecified abdominal pain: Secondary | ICD-10-CM

## 2018-07-21 DIAGNOSIS — I10 Essential (primary) hypertension: Secondary | ICD-10-CM | POA: Diagnosis present

## 2018-07-21 DIAGNOSIS — R4182 Altered mental status, unspecified: Secondary | ICD-10-CM

## 2018-07-21 DIAGNOSIS — Z8049 Family history of malignant neoplasm of other genital organs: Secondary | ICD-10-CM

## 2018-07-21 DIAGNOSIS — I729 Aneurysm of unspecified site: Secondary | ICD-10-CM

## 2018-07-21 DIAGNOSIS — E785 Hyperlipidemia, unspecified: Secondary | ICD-10-CM | POA: Diagnosis present

## 2018-07-21 DIAGNOSIS — Z90710 Acquired absence of both cervix and uterus: Secondary | ICD-10-CM

## 2018-07-21 DIAGNOSIS — I110 Hypertensive heart disease with heart failure: Secondary | ICD-10-CM | POA: Diagnosis present

## 2018-07-21 DIAGNOSIS — Z8542 Personal history of malignant neoplasm of other parts of uterus: Secondary | ICD-10-CM

## 2018-07-21 DIAGNOSIS — D509 Iron deficiency anemia, unspecified: Secondary | ICD-10-CM | POA: Diagnosis present

## 2018-07-21 DIAGNOSIS — C786 Secondary malignant neoplasm of retroperitoneum and peritoneum: Secondary | ICD-10-CM | POA: Diagnosis not present

## 2018-07-21 DIAGNOSIS — R059 Cough, unspecified: Secondary | ICD-10-CM

## 2018-07-21 DIAGNOSIS — I7102 Dissection of abdominal aorta: Secondary | ICD-10-CM | POA: Diagnosis present

## 2018-07-21 DIAGNOSIS — I5032 Chronic diastolic (congestive) heart failure: Secondary | ICD-10-CM | POA: Diagnosis present

## 2018-07-21 DIAGNOSIS — Z801 Family history of malignant neoplasm of trachea, bronchus and lung: Secondary | ICD-10-CM

## 2018-07-21 DIAGNOSIS — Z885 Allergy status to narcotic agent status: Secondary | ICD-10-CM

## 2018-07-21 DIAGNOSIS — I341 Nonrheumatic mitral (valve) prolapse: Secondary | ICD-10-CM | POA: Diagnosis present

## 2018-07-21 DIAGNOSIS — F329 Major depressive disorder, single episode, unspecified: Secondary | ICD-10-CM | POA: Diagnosis present

## 2018-07-21 DIAGNOSIS — Z681 Body mass index (BMI) 19 or less, adult: Secondary | ICD-10-CM

## 2018-07-21 DIAGNOSIS — N179 Acute kidney failure, unspecified: Secondary | ICD-10-CM | POA: Diagnosis present

## 2018-07-21 DIAGNOSIS — D638 Anemia in other chronic diseases classified elsewhere: Secondary | ICD-10-CM | POA: Diagnosis present

## 2018-07-21 DIAGNOSIS — B9561 Methicillin susceptible Staphylococcus aureus infection as the cause of diseases classified elsewhere: Secondary | ICD-10-CM | POA: Diagnosis present

## 2018-07-21 DIAGNOSIS — Z8249 Family history of ischemic heart disease and other diseases of the circulatory system: Secondary | ICD-10-CM

## 2018-07-21 DIAGNOSIS — Z8543 Personal history of malignant neoplasm of ovary: Secondary | ICD-10-CM

## 2018-07-21 DIAGNOSIS — Z1501 Genetic susceptibility to malignant neoplasm of breast: Secondary | ICD-10-CM

## 2018-07-21 DIAGNOSIS — Z9221 Personal history of antineoplastic chemotherapy: Secondary | ICD-10-CM

## 2018-07-21 LAB — CBC WITH DIFFERENTIAL/PLATELET
Abs Immature Granulocytes: 0.05 10*3/uL (ref 0.00–0.07)
Basophils Absolute: 0 10*3/uL (ref 0.0–0.1)
Basophils Relative: 0 %
EOS ABS: 0 10*3/uL (ref 0.0–0.5)
Eosinophils Relative: 0 %
HCT: 31.5 % — ABNORMAL LOW (ref 36.0–46.0)
Hemoglobin: 9.7 g/dL — ABNORMAL LOW (ref 12.0–15.0)
Immature Granulocytes: 1 %
Lymphocytes Relative: 17 %
Lymphs Abs: 1.4 10*3/uL (ref 0.7–4.0)
MCH: 21.3 pg — ABNORMAL LOW (ref 26.0–34.0)
MCHC: 30.8 g/dL (ref 30.0–36.0)
MCV: 69.1 fL — ABNORMAL LOW (ref 80.0–100.0)
MONO ABS: 1 10*3/uL (ref 0.1–1.0)
Monocytes Relative: 12 %
Neutro Abs: 5.7 10*3/uL (ref 1.7–7.7)
Neutrophils Relative %: 70 %
Platelets: 204 10*3/uL (ref 150–400)
RBC: 4.56 MIL/uL (ref 3.87–5.11)
RDW: 20.4 % — AB (ref 11.5–15.5)
WBC: 8.1 10*3/uL (ref 4.0–10.5)
nRBC: 0 % (ref 0.0–0.2)

## 2018-07-21 LAB — COMPREHENSIVE METABOLIC PANEL
ALT: 10 U/L (ref 0–44)
AST: 23 U/L (ref 15–41)
Albumin: 3.4 g/dL — ABNORMAL LOW (ref 3.5–5.0)
Alkaline Phosphatase: 62 U/L (ref 38–126)
Anion gap: 13 (ref 5–15)
BILIRUBIN TOTAL: 0.7 mg/dL (ref 0.3–1.2)
BUN: 27 mg/dL — ABNORMAL HIGH (ref 8–23)
CO2: 25 mmol/L (ref 22–32)
CREATININE: 1.38 mg/dL — AB (ref 0.44–1.00)
Calcium: 9.3 mg/dL (ref 8.9–10.3)
Chloride: 94 mmol/L — ABNORMAL LOW (ref 98–111)
GFR calc Af Amer: 44 mL/min — ABNORMAL LOW (ref >60)
GFR calc non Af Amer: 38 mL/min — ABNORMAL LOW (ref >60)
Glucose, Bld: 136 mg/dL — ABNORMAL HIGH (ref 70–99)
Potassium: 3.6 mmol/L (ref 3.5–5.1)
SODIUM: 132 mmol/L — AB (ref 135–145)
Total Protein: 8.5 g/dL — ABNORMAL HIGH (ref 6.5–8.1)

## 2018-07-21 LAB — PROTIME-INR
INR: 1.24
Prothrombin Time: 15.5 seconds — ABNORMAL HIGH (ref 11.4–15.2)

## 2018-07-21 LAB — LACTIC ACID, PLASMA: Lactic Acid, Venous: 1.5 mmol/L (ref 0.5–1.9)

## 2018-07-21 LAB — AMMONIA: Ammonia: 11 umol/L (ref 9–35)

## 2018-07-21 MED ORDER — SODIUM CHLORIDE 0.9% FLUSH
3.0000 mL | Freq: Once | INTRAVENOUS | Status: AC
  Administered 2018-07-21: 3 mL via INTRAVENOUS

## 2018-07-21 MED ORDER — PIPERACILLIN-TAZOBACTAM 3.375 G IVPB 30 MIN
3.3750 g | Freq: Once | INTRAVENOUS | Status: AC
  Administered 2018-07-21: 3.375 g via INTRAVENOUS
  Filled 2018-07-21: qty 50

## 2018-07-21 MED ORDER — SODIUM CHLORIDE (PF) 0.9 % IJ SOLN
INTRAMUSCULAR | Status: AC
  Filled 2018-07-21: qty 50

## 2018-07-21 MED ORDER — IOHEXOL 300 MG/ML  SOLN
30.0000 mL | Freq: Once | INTRAMUSCULAR | Status: AC | PRN
  Administered 2018-07-21: 30 mL via ORAL

## 2018-07-21 MED ORDER — SODIUM CHLORIDE 0.9 % IV BOLUS
500.0000 mL | Freq: Once | INTRAVENOUS | Status: AC
  Administered 2018-07-21: 500 mL via INTRAVENOUS

## 2018-07-21 MED ORDER — FENTANYL CITRATE (PF) 100 MCG/2ML IJ SOLN
25.0000 ug | Freq: Once | INTRAMUSCULAR | Status: AC
  Administered 2018-07-21: 25 ug via INTRAVENOUS
  Filled 2018-07-21: qty 2

## 2018-07-21 MED ORDER — IOHEXOL 300 MG/ML  SOLN
75.0000 mL | Freq: Once | INTRAMUSCULAR | Status: AC | PRN
  Administered 2018-07-21: 75 mL via INTRAVENOUS

## 2018-07-21 NOTE — ED Notes (Signed)
Bed: WA13 Expected date:  Expected time:  Means of arrival:  Comments: Hold  

## 2018-07-21 NOTE — Consult Note (Addendum)
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  Patient presented to the ED today after visit to gyn oncologist with confusion and failure to thrive and also reports back pain over the past week.  Complicated hx included neo adjuvant chemo with subsequent bilateral salphingo-oophorectomy/omentectomy/debulking in 8242 complicated by suspected microperforation requiring long term pelvic drain placement.  Most recently hospitalized with MSSA bacteremia from port infection in January 2020 and port removed.  Febrile on presentation today.  Still complaining of some intermittent back pain.  Family states she lived independently prior to this confusion.  They are under the impression that she is cancer free.   Past Medical History:  Diagnosis Date  . Acute on chronic respiratory failure with hypoxia ([place]) 03/07/2018  . Anxiety and depression   . Aortic aneurysm ([place])    see ov note 03/2017   . Arthritis   . Aspiration pneumonia ([place])   . Carcinomatosis ([place]) 10/2017  . CHF (congestive heart failure) ([name])    diastolic dysfunction  . COPD (chronic obstructive pulmonary disease) ([place]) 10/23/2017  . Depression   . Family history of breast cancer   . Family history of prostate cancer   . Family history of uterine cancer   . History of diverticulitis   . HLD (hyperlipidemia) 10/23/2017  . Hypercholesterolemia   . Hypertension   . Mitral valve prolapse   . Pelvic abscess in female   . Sleep apnea    cpap machine mask and tubing in [place] per patient.  Patient does not have mask or tubing to bring on DOS of 01/27/2018.   Marland Kitchen Uterine cancer ([place])    had partial hysterectomy at 27    Past  Surgical History:  Procedure Laterality Date  . ABDOMINAL HYSTERECTOMY    . cardiac monitor implant    . CEREBRAL ANEURYSM REPAIR    . CERVICAL SPINE SURGERY     bone spurs  . DEBULKING N/A 01/27/2018   Procedure: RADICAL DEBULKING;  Surgeon: [name], MD;  Location: [facility];  Service: Gynecology;  Laterality: N/A;  . IR CATHETER TUBE CHANGE  06/13/2018  . IR FLUORO GUIDE PORT INSERTION RIGHT  10/14/2017  . IR PARACENTESIS  10/14/2017  . IR RADIOLOGIST EVAL & MGMT  04/26/2018  . IR RADIOLOGIST EVAL & MGMT  05/10/2018  . IR RADIOLOGIST EVAL & MGMT  05/25/2018  . IR RADIOLOGIST EVAL & MGMT  07/20/2018  . IR REMOVAL TUN ACCESS W/ PORT W/O FL MOD SED  07/02/2018  . IR US GUIDE VASC ACCESS RIGHT  10/14/2017  . LAPAROTOMY N/A 01/27/2018   Procedure: EXPLORATORY LAPAROTOMY WITH REMOVAL OF BILATERAL TUBES AND OVARIES;  Surgeon: [name], MD;  Location: [facility];  Service: Gynecology;  Laterality: N/A;  . LAPAROTOMY N/A 01/31/2018   Procedure: EXPLORATORY LAPAROTOMY, ABDOMINAL WASHOUT;  Surgeon: Isabel Caprice, MD;  Location: [facility];  Service: Gynecology;  Laterality: N/A;  . OMENTECTOMY N/A 01/27/2018   Procedure: OMENTECTOMY;  Surgeon: [name], MD;  Location: [facility];  Service: Gynecology;  Laterality: N/A;  . PARTIAL HYSTERECTOMY  1971   "precancer"  . TEE WITHOUT CARDIOVERSION N/A 07/04/2018   Procedure: TRANSESOPHAGEAL ECHOCARDIOGRAM (TEE);  Surgeon: Sanda Klein, MD;  Location: [facility];  Service: Cardiovascular;  Laterality: N/A;    Allergies  Allergen Reactions  . Oxycodone Other (See Comments)    confusion    Prior to Admission medications   Medication Sig Start Date End Date Taking? Authorizing Provider  acetaminophen (TYLENOL) 325 MG tablet Take 2 tablets (650 mg total) by mouth every 6 (six) hours as needed for mild pain, fever or headache. 03/04/18  Yes Cross, Melissa D, NP  carvedilol (COREG) 12.5 MG tablet Take 12.5 mg by mouth 2 (two) times daily with a meal.    Yes [provider]  magnesium oxide (MAG-OX) 400 MG tablet Take 400 mg by mouth daily.   Yes [provider]  mirtazapine (REMERON) 30 MG tablet Take 1 tablet (30 mg total) by mouth at bedtime. 05/13/18  Yes [name], MD  Multiple Vitamin (MULTIVITAMIN WITH MINERALS) TABS tablet Take 1 tablet by mouth daily.   Yes [provider]  HYDROcodone-acetaminophen (NORCO/VICODIN) 5-325 MG tablet Take 1-2 tablets by mouth every 6 (six) hours as needed for severe pain. Patient not taking: Reported on 07/21/2018 07/07/18   Donne Hazel, MD  polyethylene glycol [facility] / Floria Raveling) packet Take 17 g by mouth daily. Patient not taking: Reported on 07/21/2018 07/08/18   Donne Hazel, MD  senna-docusate (SENOKOT-S) 8.6-50 MG tablet Take 1 tablet by mouth at bedtime for 30 days. Patient not taking: Reported on 07/21/2018 07/07/18 08/06/18  Donne Hazel, MD    Social History   Socioeconomic History  . Marital status: Married    Spouse name: Not on file  . Number of children: 3  . Years of education: Not on file  . Highest education level: Not on file  Occupational History  . Occupation: retired  Scientific laboratory technician  . Financial resource strain: Not on file  . Food insecurity:    Worry: Not on file    Inability: Not on file  . Transportation needs:    Medical: Not on file    Non-medical: Not on file  Tobacco Use  . Smoking status: Former Smoker    Packs/day: 1.00    Years: 55.00    Pack years: 55.00    Last attempt to quit: 10/06/2017    Years since quitting: 0.7  . Smokeless tobacco: Never Used  Substance and Sexual Activity  . Alcohol use: Never    Frequency: Never  . Drug use: Never  . Sexual activity: Not Currently  Lifestyle  . Physical activity:    Days per week: Not on file    Minutes per session: Not on file  . Stress: Not on file  Relationships  . Social connections:    Talks on phone: Not on file    Gets together: Not on file    Attends religious  service: Not on file    Active member of club or organization: Not on file    Attends meetings of clubs or organizations: Not on file    Relationship status: Not on file  . Intimate partner violence:    Fear of current or ex partner: Not on file    Emotionally abused: Not on file    Physically abused: Not on file    Forced sexual activity: Not on file  Other Topics Concern  . Not on file  Social History Narrative  . Not on file     Family History  Problem Relation Age of Onset  . Endometrial cancer Sister 77  endometrial ca  . Endometrial cancer Paternal Aunt 80  . Breast cancer Paternal Aunt   . Stomach cancer Maternal Grandfather        d. 55s  . Prostate cancer Paternal Uncle   . Breast cancer Cousin        pat first cousin  . Breast cancer Cousin        pat first cousin's daughter  . Lung cancer Father        lung ca  . Heart disease Mother   . Stroke Paternal Grandmother   . Endometrial cancer Cousin        paternal first cousin's daughter  . Breast cancer Other        niece dx less than 20s  . Breast cancer Other        niece, dx under 50    ROS: [x]  Positive   [ ]  Negative   [ ]  All sytems reviewed and are negative  Cardiovascular: []  chest pain/pressure []  palpitations []  SOB lying flat []  DOE []  pain in legs while walking []  pain in legs at rest []  pain in legs at night []  non-healing ulcers []  hx of DVT []  swelling in legs  Pulmonary: []  productive cough []  asthma/wheezing []  home O2  Neurologic: []  weakness in []  arms []  legs []  numbness in []  arms []  legs []  hx of CVA []  mini stroke [] difficulty speaking or slurred speech []  temporary loss of vision in one eye []  dizziness [x]  back pain Hematologic: []  hx of cancer []  bleeding problems []  problems with blood clotting easily  Endocrine:   []  diabetes []  thyroid disease  GI []  vomiting blood []  blood in stool  GU: []  CKD/renal failure []  HD--[]  M/W/F or []  T/T/S []   burning with urination []  blood in urine  Psychiatric: []  anxiety []  depression  Musculoskeletal: []  arthritis []  joint pain  Integumentary: []  rashes []  ulcers  Constitutional: [x]  fever []  chills   Physical Examination  Vitals:   07/21/18 1930 07/21/18 2204  BP: (!) 141/78 (!) 154/84  Pulse: 100 100  Resp: (!) 24 (!) 24  Temp:    SpO2: 100% 98%   Body mass index is 17.5 kg/m.  General:  Resting in bed, very thin and seems frail Gait: Not observed HENT: WNL, normocephalic Pulmonary: normal non-labored breathing Cardiac: regular, without  Murmurs, rubs or gallops Abdomen: soft, NT/ND, well healed midline incision, LLQ pelvic drain Vascular Exam/Pulses:  Right Left  Radial 2+ (normal) 2+ (normal)  Ulnar    Femoral 2+ (normal) 2+ (normal)  Popliteal    DP 2+ (normal) 2+ (normal)  PT     Extremities: without ischemic changes, without Gangrene , without cellulitis; without open wounds;  Musculoskeletal: no muscle wasting or atrophy  Neurologic: A&O X 3; Appropriate Affect ; SENSATION: normal; MOTOR FUNCTION:  moving all extremities equally. Speech is fluent/normal   CBC    Component Value Date/Time   WBC 8.1 07/21/2018 1324   RBC 4.56 07/21/2018 1324   HGB 9.7 (L) 07/21/2018 1324   HGB 10.3 (L) 07/19/2018 1157   HCT 31.5 (L) 07/21/2018 1324   HCT 29.6 (L) 10/15/2017 1009   PLT 204 07/21/2018 1324   PLT 234 07/19/2018 1157   MCV 69.1 (L) 07/21/2018 1324   MCH 21.3 (L) 07/21/2018 1324   MCHC 30.8 07/21/2018 1324   RDW 20.4 (H) 07/21/2018 1324   LYMPHSABS 1.4 07/21/2018 1324   MONOABS 1.0 07/21/2018 1324   EOSABS 0.0 07/21/2018 1324  BASOSABS 0.0 07/21/2018 1324    BMET    Component Value Date/Time   NA 132 (L) 07/21/2018 1324   K 3.6 07/21/2018 1324   CL 94 (L) 07/21/2018 1324   CO2 25 07/21/2018 1324   GLUCOSE 136 (H) 07/21/2018 1324   BUN 27 (H) 07/21/2018 1324   CREATININE 1.38 (H) 07/21/2018 1324   CREATININE 0.80 07/19/2018 1157    CALCIUM 9.3 07/21/2018 1324   GFRNONAA 38 (L) 07/21/2018 1324   GFRNONAA >60 07/19/2018 1157   GFRAA 44 (L) 07/21/2018 1324   GFRAA >60 07/19/2018 1157    COAGS: Lab Results  Component Value Date   INR 1.24 07/21/2018   INR 1.54 07/02/2018   INR 1.54 06/30/2018     Non-Invasive Vascular Imaging:    CT abdomen pelvis w/ contrast 07/21/18: on my review large saccular aneurysm off right side of aorta at diaphragmatic hiatus measuring 5.3 cm x 3.7 cm that was not present on CT abdomen/pelvis 2 weeks ago.  Stable left sided 3.6 x 1.7 cm saccular aneurysm on left side of aorta at diaphragmatic hiatus present on CT in 2019.    ASSESSMENT/PLAN: This is a 73 y.o. female with hx peritoneal carcinomatosis from ovarian cancer with chronic indwelling pelvic drain for micro-perforation and recent admission for sepsis with MSSA bacteremia from infected port.  Patient presents with one week of back pain and new CT findings of a 5.3 cm x 3.7 cm saccular aneurysm off the right side of the distal thoracic aorta at the diaphragmatic hiatus just above the celiac artery.  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  Given the rapid degeneration of her aorta in this segment it makes me highly suspicious for infectious process.  I discussed options including IV antibiotics and conservative measures with engagement of palliative care assuming this will be a terminal event if and when it ruptures.  Alternatively discussed option for TEVAR via transfemoral access as a temporizing measure and cover the distal thoracic aorta/supraceliac aorta where it is degenerated with suppressive lifelong antibiotics.  Discussed her aorta could continue to degenerate around the graft  and given prosthetic graft in suspected infected field would only be a temporary measure.  Her iliacs are fairly calcified and to deliver 28 mm TEVAR would require 20 F sheath which would be taking some risk for access.  I do not think she is a candidate for any open surgery based on my bedside evaluation in the ED. I have talked with the family in detail as well as to a niece that is a neurologist.  They have ultimately requested a second opinion at a academic center.  I have talked with vascular surgery at [facility] and [facility] who have both accepted the patient - waiting on first available bed.  Marty Heck, MD [facility] Office: [phone] Pager: [phone]

## 2018-07-21 NOTE — Progress Notes (Signed)
GYN ONCOLOGY OFFICE VISIT  Referring Clinician:  Dr. Kayleen Memos  CC:  Pelvic fluid collection  HPI: [name]  is a very nice 73 y.o.  P3  March 2019 she had a abdominal ultrasound for her history of aortic aneurysm there was an incidental finding of a cirrhotic appearing liver and ascites.  Initially treated with diuretics but after lack of improvement further work-up was performed including a CT scan of the abdomen and pelvis.  As noted below etiology was significant carcinomatosis with peritoneal thickening sigmoid colon with significant thickening new pancreatic cyst compared to a 2018 scan and omental caking.  Seen by medical oncology [place] which is where she lives.  Paracentesis was ordered along with tumor markers including CA 125 which was noted to be elevated CA-19-9 which was mildly elevated normal being 35 the cytology on the paracentesis was positive for adenocarcinoma IHC staining suggested a GYN primary CK 7 and Ca1 25 being positive then CEA CK 20 and CA-19-9 being negative.  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 CK20, CEA and Ca19-9 were negative    09/16/2017 Imaging    CT abdomen and pelvis elsewhere showed small bilateral pleural effusion, cardiomegaly, 2.6 cm ovoid cystic mass at the pancreatic tail, 3.5 cm saccular aneurysm, no retroperitoneal lymphadenopathy, marked sigmoid wall thickening without overt mechanical bowel obstruction, marked omental thickening and ascites.    10/07/2017 Genetic Testing    BRCA2 c.5073dupA (p.Trp1692Metfs*3) pathogenic mutation and APC c.7088A>C (p.Lys2363Arg), POLE c.1007A>G (p.Asn36Ser) and RAD51D c.434G>A (p.Arg145His) VUS identified the [facility] panel.  The [facility] gene panel offered by [employer] includes sequencing and deletion/duplication testing of the following 35 genes: APC, ATM, AXIN2, BARD1, BMPR1A, BRCA1, BRCA2, BRIP1, CHD1, CDK4, CDKN2A, CHEK2, EPCAM (large rearrangement only), HOXB13, (sequencing only), GALNT12, MLH1, MSH2, MSH3 (excluding repetitive portions of exon 1), MSH6, MUTYH, NBN, NTHL1, PALB2, PMS2, PTEN, RAD51C, RAD51D, RNF43, RPS20, SMAD4, STK11, and TP53. Sequencing was performed for select regions of POLE and POLD1, and large rearrangement analysis was performed for select regions of GREM1. The report date is Oct 07, 2017.    10/08/2017 Procedure    Extensive omental and peritoneal tumor is present anteriorly deep to the abdominal wall. From a midline approach, tumor was sampled just to the left of midline in the upper pelvis, below the level of the umbilicus. Solid tissue was obtained.  IMPRESSION: CT-guided core biopsy performed of omental/peritoneal tumor in the anterior peritoneal cavity.    10/08/2017 Pathology Results    Soft Tissue Needle Core Biopsy, Omentum/Peritoneal Cavity - POORLY DIFFERENTIATED CARCINOMA. Microscopic Comment Immunohistochemistry will be performed  and reported as an addendum. Dr. [name] agrees. Called to [employer] on 10/11/17. (JDP:kh 10/11/17) ADDENDUM: Immunohistochemistry shows  the tumor is positive with cytokeratin AE1/AE3, cytokeratin 7, estrogen receptor, WT-1, and p16. The tumor is negative with CD56, chromogranin, synaptophysin, CDX-2, cytokeratin 20, progesterone receptor, GATA-3, GCDFP, Napsin-A and thyroid transcription factor-1. 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 The catheter is ready for use.    10/14/2017 Procedure    A total of approximately 1300 mL of ascitic fluid was removed.    10/15/2017 Tumor Marker    Patient's tumor was tested for the following markers: CA-125 Results of the tumor marker test revealed 775.9    10/18/2017 -  Chemotherapy    The patient had carboplatin and Taxol    10/22/2017 - 10/27/2017 Hospital Admission    She was admitted to the hospital for evaluation of shortness of breath and was found to have large pleural effusion, successfully removed and cytology was malignant.  Sepsis was ruled out.    10/23/2017 Imaging    No evidence of pulmonary embolism.  New moderate size left effusion and new small to moderate right effusion with associated bibasilar atelectasis.  Known extensive peritoneal carcinomatosis and moderate ascites.  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 Recommend correlation with prior exams and vascular surgery consultation.  2.1 cm left renal cyst.  Mild cardiomegaly and minimal atherosclerotic coronary artery disease.  Aortic Atherosclerosis (ICD10-I70.0) and Emphysema (ICD10-J43.9).    10/24/2017 Pathology Results    PLEURAL FLUID, LEFT (SPECIMEN 1 OF 1 COLLECTED 10/24/17): MALIGNANT CELLS CONSISTENT  WITH METASTATIC ADENOCARCINOMA. SEE COMMENT.    10/24/2017 Procedure    Successful ultrasound guided left thoracentesis yielding 580 mL of pleural fluid.  Follow-up chest radiograph shows no pneumothorax    11/03/2017 Tumor Marker    Patient's tumor was tested for the following markers: CA-125 Results of the tumor marker test revealed 991.9    11/29/2017 Tumor Marker    Patient's tumor was tested for the following markers: CA-125 Results of the tumor marker test revealed 133    12/14/2017 Imaging    Decreased peritoneal carcinomatosis, and near complete resolution of ascites since prior exam.  No new or progressive disease within the abdomen or pelvis.  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  Skeet Latch, M.D. 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  Miliary disease of the bilateral diaphragmatic surfaces and also the mesentery of the large and small bowel.  Left adnexa densely adherent to the distal rectosigmoid colon.  Right  adnexa adherent to the pelvic sidewall.  Multiple loops of bowel adherent to miliary disease in the vaginal cuff.  At the completion of procedure the patient was R1 disease status     01/27/2018 Pathology Results    1. Omentum, resection for tumor, infragastric - HIGH GRADE SEROUS CARCINOMA, >2 CM. - SEE ONCOLOGY TABLE. 2. Soft tissue mass, simple excision, abdominal wall nodule - HIGH GRADE SEROUS CARCINOMA, >2 CM. - SEE ONCOLOGY TABLE. 3. Ovary and fallopian tube, left - OVARY AND FALLOPIAN TUBE WITH SURFACE SEROUS CARCINOMA. 4. Ovary and fallopian tube, right - OVARY AND FALLOPIAN TUBE WITH SURFACE SEROUS CARCINOMA. Microscopic Comment 1. OVARY or FALLOPIAN TUBE or PRIMARY PERITONEUM: Procedure: Omentectomy, soft tissue excision, bilateral salpingo-oophorectomy. Specimen Integrity: Intact. Tumor Site: See comment. Ovarian Surface Involvement (required only if applicable): Present, bilateral. Fallopian Tube Surface Involvement (required only if applicable): Present, bilateral. Tumor Size: >2 cm, see comment. Histologic Type: High grade serous carcinoma. Histologic Grade: High grade. Implants (required for advanced stage serous/seromucinous borderline tumors only): See comment. Other Tissue/ Organ Involvement: Bilateral fallopian tubes, bilateral ovaries, omentum, peritoneum. Largest Extrapelvic Peritoneal Focus (required only if applicable): >2 cm. Peritoneal/Ascitic Fluid: N/A. Pleural effusion ([id number]) positive. Treatment Effect (required only for high-grade serous carcinomas): CRS-1 Regional Lymph Nodes: No lymph nodes submitted or found Pathologic Stage Classification (pTNM, AJCC 8th Edition): ypT3c, ypNX, pM1a Representative Tumor Block: 1A-B Comment(s): The omentum is largely infiltrated by tumor (>2 cm). Both ovaries and fallopian tubes were entirely submitted. A distinct precursor lesion is not identified, only surface deposits. While it is not recommended to diagnose a  primary peritoneal carcinoma after treatment, it is assumed the likely primary given the lack of precursor lesion or significant disease in the ovaries and fallopian tubes.    01/27/2018 - 03/23/2018 Hospital Admission    She was admitted to the hospital for interval debulking surgery, complicated by sepsis and respiratory failure.    03/08/2018 Imaging    7.0 x 1.3 cm left pelvic fluid collection is noted with air-fluid level and enhancing margins, most consistent with abscess. It is slightly smaller compared to prior exam.  Stable position of surgical drainage catheter is seen in the pelvis.  Sigmoid diverticulosis is noted without inflammation.  3.9 cm aortic aneurysm is noted at aortic hiatus. Recommend followup by [name] in 2 years. This recommendation follows ACR consensus guidelines: White Paper of the ACR Incidental Findings Committee II on Vascular Findings. Joellyn Rued Radiol 2013; [phone].     03/29/2018 Tumor Marker    Patient's tumor was tested for the following markers: CA-125 Results of the tumor marker test revealed 35.3    04/07/2018 Imaging    CT abdomen and pelvis Persistent rim enhancing fluid and gas collection in the left pelvic sidewall measuring 7.2 x 3.0 cm, and not significantly changed compared to prior study. Surgical drain remains in place in the central pelvis.  Sigmoid colon diverticulosis, with muscular hypertrophy versus mild diverticulitis.  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 Consider vascular surgery referral/consultation if not already obtained.    04/18/2018 Imaging    CT-guided placement of a drainage catheter within the left pelvic sidewall collection. The collection contains mostly gas and only 1 mL  bloody fluid could be removed. The fluid was sent for culture.  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  Will schedule for follow-up in the IR clinic in 1 week with a CT and  drain injection.    04/26/2018 Imaging    CT abdomen and pelvis 1. Left pelvic sidewall abscess collection is decompressed with the percutaneous drain. No residual fluid. There continues to be soft tissue thickening and small pockets of gas in this area. Findings concerning for a residual colonic fistula tract but this will be further characterized with drain injection with fluoroscopy. No new abscess or fluid collections. 2. Persistent gas in the left pelvis related to the old surgical drain site. However, this gas-filled cavity is decreasing in size compared to 04/18/2018. 3. 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 Aneurysm measures up to 3.8 cm. Aortic aneurysm NOS (ICD10-I71.9). Aortic Atherosclerosis (ICD10-I70.0).  4. 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    04/26/2018 Procedure    Positive for a fistula tract between the percutaneous drain and the sigmoid colon.  No significant abscess cavity.     05/10/2018 Tumor Marker    Patient's tumor was tested for the following markers: CA-125 Results of the tumor marker test revealed 18.8    05/10/2018 Imaging    1. Left pelvic sidewall abscess collection remains decompressed with the percutaneous drain. No residual fluid collection. There continues to be a small amount of gas adjacent to the drain and likely related to a fistula tract. Patient is scheduled for a drain injection. 2. No new abscess or fluid collections. Again noted is trace perisplenic fluid. 3. 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 Aneurysm measures up to 3.8 cm. Aortic aneurysm NOS (ICD10-I71.9). Aortic Atherosclerosis (ICD10-I70.0).    05/10/2018 Procedure    Persistent fistula tract between the decompressed abscess cavity and the sigmoid colon. The fistula tract remains widely patent but smaller than it was on 04/26/2018.  The drain was kept in place. Instructed the patient to flush the tube every other day. Patient requested  a suction bulb but we instructed the patient NOT to use suction on this bulb. Patient will follow-up for a drain injection in approximately 2 weeks.    06/10/2018 Tumor Marker    Patient's tumor was tested for the following markers: CA-125 Results of the tumor marker test revealed 19.4    06/30/2018 Imaging    1. New small RIGHT pleural effusion and nonspecific retroperitoneal/mesenteric edema. 2. LEFT pelvic drainage catheter again noted - no evidence of residual abscess. 3. 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 4. Cardiomegaly. 5.  Aortic Atherosclerosis (ICD10-I70.0).    06/30/2018 - 07/07/2018 Hospital Admission    She was admitted due to weakness and was found to have bacteremia    07/02/2018 Procedure    Status post removal of right IJ port catheter.    07/04/2018 Echocardiogram    TEE showed no evidence of endocarditis   Interval history Jo Parker was progressing very well and was managing her activities of daily living with regular IV fluid boluses and management of hypomagnesemia in December 2019.  She returned to [place] and self-administered IV fluids with a port needle that remained in place over a long period of time.   She was admitted on 06/30/2018 with a diagnosis of sepsis due to MSSA bacteremia.  The port was removed on January 25 and there was no evidence of endocarditis  noted on  transesophageal echo on July 04, 2018.  She was discharged home on antibiotics through a PICC line with plan for intermittent fluids.  The time of discharge blood cultures were negative.  On Monday the family reports that Ms. Rhew vomited after eating shrimp and oysters.  Over the following days she has had limited oral intake and this midday when her son came home to check on her he found her to be confused, was placing money around her drain site was, unable to follow commands was agitated and more irritable than is usual She complains of right lower quadrant  discomfort radiating to the back, little interest in food.  Current Meds:  Outpatient Encounter Medications as of 07/21/2018  Medication Sig  . acetaminophen (TYLENOL) 325 MG tablet Take 2 tablets (650 mg total) by mouth every 6 (six) hours as needed for mild pain, fever or headache.  . carvedilol (COREG) 12.5 MG tablet Take 12.5 mg by mouth 2 (two) times daily with a meal.  . ceFAZolin (ANCEF) IVPB Inject 2 g into the vein every 8 (eight) hours. Indication: MSSA bacteremia Last Day of Therapy:  07/17/18 Labs - Once weekly:  CBC/D and BMP, Labs - Every other week:  ESR and CRP  . HYDROcodone-acetaminophen (NORCO/VICODIN) 5-325 MG tablet Take 1-2 tablets by mouth every 6 (six) hours as needed for severe pain.  Marland Kitchen lidocaine-prilocaine (EMLA) cream Apply 1 application topically as needed. (Patient taking differently: Apply 1 application topically as needed (port access). )  . magnesium oxide (MAG-OX) 400 MG tablet Take 400 mg by mouth daily.  . mirtazapine (REMERON) 30 MG tablet Take 1 tablet (30 mg total) by mouth at bedtime.  . Multiple Vitamin (MULTIVITAMIN WITH MINERALS) TABS tablet Take 1 tablet by mouth daily.  . polyethylene glycol (MIRALAX / GLYCOLAX) packet Take 17 g by mouth daily.  Marland Kitchen senna-docusate (SENOKOT-S) 8.6-50 MG tablet Take 1 tablet by mouth at bedtime for 30 days.   No facility-administered encounter medications on file as of 07/21/2018.     Allergy:  Allergies  Allergen Reactions  . Oxycodone Other (See Comments)    confusion    Social Hx:   Social History   Socioeconomic History  . Marital status: Married    Spouse name: Not on file  . Number of children: 3  . Years of education: Not on file  . Highest education level: Not on file  Occupational History  . Occupation: retired  Scientific laboratory technician  . Financial resource strain: Not on file  . Food insecurity:    Worry: Not on file    Inability: Not on file  . Transportation needs:    Medical: Not on file     Non-medical: Not on file  Tobacco Use  . Smoking status: Former Smoker    Packs/day: 1.00    Years: 55.00    Pack years: 55.00    Last attempt to quit: 10/06/2017    Years since quitting: 0.7  . Smokeless tobacco: Never Used  Substance and Sexual Activity  . Alcohol use: Never    Frequency: Never  . Drug use: Never  . Sexual activity: Not Currently  Lifestyle  . Physical activity:    Days per week: Not on file    Minutes per session: Not on file  . Stress: Not on file  Relationships  . Social connections:    Talks on phone: Not on file    Gets together: Not on file    Attends religious service: Not on file  Active member of club or organization: Not on file    Attends meetings of clubs or organizations: Not on file    Relationship status: Not on file  . Intimate partner violence:    Fear of current or ex partner: Not on file    Emotionally abused: Not on file    Physically abused: Not on file    Forced sexual activity: Not on file  Other Topics Concern  . Not on file  Social History Narrative  . Not on file    Past Surgical Hx:  Past Surgical History:  Procedure Laterality Date  . ABDOMINAL HYSTERECTOMY    . cardiac monitor implant    . CEREBRAL ANEURYSM REPAIR    . CERVICAL SPINE SURGERY     bone spurs  . DEBULKING N/A 01/27/2018   Procedure: RADICAL DEBULKING;  Surgeon: [name], MD;  Location: [facility];  Service: Gynecology;  Laterality: N/A;  . IR CATHETER TUBE CHANGE  06/13/2018  . IR FLUORO GUIDE PORT INSERTION RIGHT  10/14/2017  . IR PARACENTESIS  10/14/2017  . IR RADIOLOGIST EVAL & MGMT  04/26/2018  . IR RADIOLOGIST EVAL & MGMT  05/10/2018  . IR RADIOLOGIST EVAL & MGMT  05/25/2018  . IR RADIOLOGIST EVAL & MGMT  07/20/2018  . IR REMOVAL TUN ACCESS W/ PORT W/O FL MOD SED  07/02/2018  . IR US GUIDE VASC ACCESS RIGHT  10/14/2017  . LAPAROTOMY N/A 01/27/2018   Procedure: EXPLORATORY LAPAROTOMY WITH REMOVAL OF BILATERAL TUBES AND OVARIES;  Surgeon: [name],  MD;  Location: [facility];  Service: Gynecology;  Laterality: N/A;  . LAPAROTOMY N/A 01/31/2018   Procedure: EXPLORATORY LAPAROTOMY, ABDOMINAL WASHOUT;  Surgeon: Isabel Caprice, MD;  Location: [facility];  Service: Gynecology;  Laterality: N/A;  . OMENTECTOMY N/A 01/27/2018   Procedure: OMENTECTOMY;  Surgeon: [name], MD;  Location: [facility];  Service: Gynecology;  Laterality: N/A;  . PARTIAL HYSTERECTOMY  1971   "precancer"  . TEE WITHOUT CARDIOVERSION N/A 07/04/2018   Procedure: TRANSESOPHAGEAL ECHOCARDIOGRAM (TEE);  Surgeon: Sanda Klein, MD;  Location: [facility];  Service: Cardiovascular;  Laterality: N/A;    Past Medical Hx:  Past Medical History:  Diagnosis Date  . Acute on chronic respiratory failure with hypoxia ([place]) 03/07/2018  . Anxiety and depression   . Aortic aneurysm ([place])    see ov note 03/2017   . Arthritis   . Aspiration pneumonia ([place])   . Carcinomatosis ([place]) 10/2017  . CHF (congestive heart failure) ([name])    diastolic dysfunction  . COPD (chronic obstructive pulmonary disease) ([place]) 10/23/2017  . Depression   . Family history of breast cancer   . Family history of prostate cancer   . Family history of uterine cancer   . History of diverticulitis   . HLD (hyperlipidemia) 10/23/2017  . Hypercholesterolemia   . Hypertension   . Mitral valve prolapse   . Pelvic abscess in female   . Sleep apnea    cpap machine mask and tubing in [place] per patient.  Patient does not have mask or tubing to bring on DOS of 01/27/2018.   Marland Kitchen Uterine cancer ([place])    had partial hysterectomy at 27    Past Gynecological History:   GYNECOLOGIC HISTORY:  No LMP recorded. Patient has had a hysterectomy.  For precancer Menarche: 73 years old P 3 LMP 1977 Contraceptive OCP x8 years HRT none  Last Pap N/A  Family Hx:  Family History  Problem Relation Age of  Onset  . Endometrial cancer Sister 42       endometrial ca  . Endometrial cancer Paternal Aunt 80  . Breast cancer  Paternal Aunt   . Stomach cancer Maternal Grandfather        d. 76s  . Prostate cancer Paternal Uncle   . Breast cancer Cousin        pat first cousin  . Breast cancer Cousin        pat first cousin's daughter  . Lung cancer Father        lung ca  . Heart disease Mother   . Stroke Paternal Grandmother   . Endometrial cancer Cousin        paternal first cousin's daughter  . Breast cancer Other        niece dx less than 31s  . Breast cancer Other        niece, dx under 96    Review of Systems:  Review of Systems  Constitutional: Positive for fatigue. Negative for appetite change, chills and fever.       Fair appetite  HENT:   Negative for tinnitus and trouble swallowing.   Eyes: Negative.  Negative for icterus.  Respiratory: Positive for cough and shortness of breath. Negative for chest tightness and wheezing.   Gastrointestinal: Negative for abdominal distention, abdominal pain, constipation, nausea, rectal pain and vomiting.  Endocrine: Negative.   Genitourinary: Positive for pelvic pain. Negative for bladder incontinence, dysuria, frequency, hematuria, vaginal bleeding and vaginal discharge.   Musculoskeletal: Positive for flank pain and gait problem. Negative for back pain, neck pain and neck stiffness.  Skin: Negative.   Neurological: Positive for gait problem.  Hematological: Negative.   Psychiatric/Behavioral: Positive for confusion, decreased concentration and depression. The patient is not nervous/anxious.    Physical Exam: BP (!) 104/57 (BP Location: Right Arm, Patient Position: Supine)   Pulse (!) 106   Temp 98.4 F (36.9 C) (Oral)   Resp 20   Wt 98 lb 12.8 oz (44.8 kg)   SpO2 100%   BMI 17.50 kg/m   General :   73 y.o., female in no apparent distress HEENT:  Normocephalic/atraumatic, symmetric, EOMI, eyelids normal Neck:   Supple, no masses.  Lymphatics:  No cervical/ submandibular/ supraclavicular/ infraclavicular/ inguinal adenopathy Respiratory:   Respirations unlabored, no use of accessory muscles, chest clear to auscultation.  Dry cough CV:   Tachycardic regular and rhythm musculoskeletal: No CVA tenderness, normal muscle strength. No murmer.  Abdomen:  Soft, non-tender,  No evidence of hernia. No masses.  No ascites Extremities:  No lymphedema, no erythema, non-tender. Skin:   Dry Neuro/Psych:  Difficulty ambulating, weak, difficulty understanding commands, Year? 1920 then self corrected to 2020. Month?  December then self corrected to Feb   Genito Urinary: Vulva: Normal external female genitalia.  Bladder/urethra: Urethral meatus normal in size and location. No lesions or   masses, well supported bladder,  R pelvic tenderness  Assessment/Plan:: Primary peritoneal adenocarcinoma stage IVb.  At initial presentation large volume ascites and positive pleural cytology was appreciated in addition to rectal wall nodularity.  Mrs. Gloss completed 4 cycles of paclitaxel carboplatin with reduction in Ca1 25-133.  Cycle #4  December 20, 2017.  Underwent robotic assisted bilateral salpingo-oophorectomy omentectomy interval debulking on January 27, 2018.  Postop course complicated by sepsis with suspicion of perforation.  She underwent an exploratory laparotomy however the microperforation could not be identified.  Her postoperative course was very complicated however with the incredible support  of the ICU team and her family and prolonged hospital course she recovered..   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 Her appointment with Dr Marcello Moores has been rescheduled.  Most recent imaging without evidence of recurrence.  CA 125 wnl.  2.  MSSA sepsis secondary to prolonged presence of a needle within the port.  Previously admitted in January 23-30 2020 and now home on cefazolin. Ms. Nyala Kirchner presents to our clinic with evidence of failure to thrive  and confusion.  Her family reports confused activity most notably since earlier today.  This is an intelligent highly communicative expressive female who today on presentation is very different than any recent previous encounter.  BP is lower than in prior out patient visits.  I am concerned that there is an underlying  infectious process may be contributing to confusion and delirium.  I have advised the patient and her son that evaluation emergency room with likely admission is the next appropriate step.  Her son Mr. [name] is her health care power of attorney.

## 2018-07-21 NOTE — ED Provider Notes (Addendum)
[facility] DEPT Provider Note   CSN: [id number] Arrival date & time: 07/21/18  1302     History   Chief Complaint Chief Complaint  Patient presents with  . Weakness  . Abdominal Pain    HPI Jo Parker is a 73 y.o. female.  The history is provided by the patient, a relative and medical records. The history is limited by the condition of the patient. No language interpreter was used.  Altered Mental Status  Presenting symptoms: confusion and disorientation   Severity:  Moderate Most recent episode:  Today Episode history:  Single Timing:  Intermittent Progression:  Waxing and waning Chronicity:  Recurrent Context: recent illness and recent infection   Associated symptoms: abdominal pain, nausea and vomiting   Associated symptoms: no fever, no headaches, no light-headedness, no palpitations, no rash and no seizures     Past Medical History:  Diagnosis Date  . Acute on chronic respiratory failure with hypoxia ([place]) 03/07/2018  . Anxiety and depression   . Aortic aneurysm ([place])    see ov note 03/2017   . Arthritis   . Aspiration pneumonia ([place])   . Carcinomatosis ([place]) 10/2017  . CHF (congestive heart failure) ([name])    diastolic dysfunction  . COPD (chronic obstructive pulmonary disease) ([place]) 10/23/2017  . Depression   . Family history of breast cancer   . Family history of prostate cancer   . Family history of uterine cancer   . History of diverticulitis   . HLD (hyperlipidemia) 10/23/2017  . Hypercholesterolemia   . Hypertension   . Mitral valve prolapse   . Pelvic abscess in female   . Sleep apnea    cpap machine mask and tubing in [place] per patient.  Patient does not have mask or tubing to bring on DOS of 01/27/2018.   Marland Kitchen Uterine cancer [facility])    had partial hysterectomy at 27    Patient Active Problem List   Diagnosis Date Noted  . MSSA bacteremia 07/01/2018  . Sepsis due to Staphylococcus aureus ([place]) 07/01/2018  .  Anemia 06/30/2018  . Fever in adult 06/30/2018  . Weakness generalized 06/30/2018  . SIRS (systemic inflammatory response syndrome) ([place]) 06/30/2018  . Preventive measure 04/05/2018  . Dehydration 03/30/2018  . Hypokalemia 03/30/2018  . Hypotension 03/30/2018  . Acute on chronic respiratory failure with hypoxia ([place]) 03/07/2018  . Tracheostomy care ([place])   . Wound dehiscence, surgical, initial encounter   . Pelvic abscess in female   . Aspiration pneumonia ([place])   . Postoperative wound infection 02/10/2018  . Status post bilateral salpingo-oophorectomy 02/09/2018  . Peritonitis ([place]) 02/09/2018  . HCAP (healthcare-associated pneumonia) 02/09/2018  . Pressure injury of skin 02/05/2018  . Acute on chronic respiratory failure ([place])   . Genetic testing 02/01/2018  . BRCA2 gene mutation positive 02/01/2018  . Free intraperitoneal air   . Acute respiratory distress 01/30/2018  . Low urine output   . Somnolence   . At risk for fluid volume overload   . AKI (acute kidney injury) ([place]) 01/29/2018  . Acute metabolic encephalopathy 08/65/7846  . Ovarian cancer ([place]) 01/27/2018  . History of uterine cancer 01/26/2018  . Family history of breast cancer   . Family history of uterine cancer   . Family history of prostate cancer   . Sinusitis, acute 12/20/2017  . 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 Anemia due to antineoplastic chemotherapy 12/20/2017  . White coat syndrome with hypertension 12/20/2017  . Hypomagnesemia   .  Absolute anemia   . Malnutrition of moderate degree 10/26/2017  . Severe sepsis ([place]) 10/23/2017  . COPD (chronic obstructive pulmonary disease) ([place]) 10/23/2017  . HLD (hyperlipidemia) 10/23/2017  . Goals of care, counseling/discussion 10/13/2017  . Deficiency anemia 10/11/2017  . Iron deficiency anemia 10/08/2017  . Severe protein-calorie malnutrition ([place]) 10/08/2017  . 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 ABDOMINAL HYSTERECTOMY    . cardiac monitor implant    . CEREBRAL ANEURYSM REPAIR    . CERVICAL SPINE SURGERY     bone spurs  . DEBULKING N/A 01/27/2018   Procedure: RADICAL DEBULKING;  Surgeon: [name], MD;  Location: [facility] ORS;  Service: Gynecology;  Laterality: N/A;  . IR CATHETER TUBE CHANGE  06/13/2018  . IR FLUORO GUIDE PORT INSERTION RIGHT  10/14/2017  . IR PARACENTESIS  10/14/2017  . IR RADIOLOGIST EVAL & MGMT  04/26/2018  . IR RADIOLOGIST EVAL & MGMT  05/10/2018  . IR RADIOLOGIST EVAL & MGMT  05/25/2018  . IR RADIOLOGIST EVAL & MGMT  07/20/2018  . IR REMOVAL TUN ACCESS W/ PORT W/O FL MOD SED  07/02/2018  . IR US GUIDE VASC ACCESS RIGHT  10/14/2017  . LAPAROTOMY N/A 01/27/2018   Procedure: EXPLORATORY LAPAROTOMY WITH REMOVAL OF BILATERAL TUBES AND OVARIES;  Surgeon: [name], MD;  Location: [facility];  Service: Gynecology;  Laterality: N/A;  . LAPAROTOMY N/A 01/31/2018   Procedure: EXPLORATORY LAPAROTOMY, ABDOMINAL WASHOUT;  Surgeon: Isabel Caprice, MD;  Location: [facility];  Service: Gynecology;  Laterality: N/A;  . OMENTECTOMY N/A 01/27/2018   Procedure: OMENTECTOMY;  Surgeon: [name], MD;  Location: [facility];  Service: Gynecology;  Laterality: N/A;  . PARTIAL HYSTERECTOMY  1971   "precancer"  . TEE WITHOUT CARDIOVERSION N/A 07/04/2018   Procedure: TRANSESOPHAGEAL ECHOCARDIOGRAM (TEE);  Surgeon: Sanda Klein, MD;  Location: [facility] ENDOSCOPY;  Service: Cardiovascular;  Laterality: N/A;     OB History   No obstetric history on file.      Home Medications    Prior to Admission medications   Medication Sig Start Date End Date Taking? Authorizing Provider  acetaminophen (TYLENOL) 325 MG tablet Take 2 tablets (650 mg total) by mouth every 6 (six) hours as needed for mild pain, fever or headache. 03/04/18  Yes Cross, Melissa D, NP  carvedilol (COREG) 12.5 MG tablet Take 12.5 mg by mouth 2 (two) times daily with a meal.   Yes  [provider]  magnesium oxide (MAG-OX) 400 MG tablet Take 400 mg by mouth daily.   Yes [provider]  mirtazapine (REMERON) 30 MG tablet Take 1 tablet (30 mg total) by mouth at bedtime. 05/13/18  Yes [name], MD  Multiple Vitamin (MULTIVITAMIN WITH MINERALS) TABS tablet Take 1 tablet by mouth daily.   Yes [provider]  HYDROcodone-acetaminophen (NORCO/VICODIN) 5-325 MG tablet Take 1-2 tablets by mouth every 6 (six) hours as needed for severe pain. Patient not taking: Reported on 07/21/2018 07/07/18   Donne Hazel, MD  polyethylene glycol [facility] Floria Raveling) packet Take 17 g by mouth daily. Patient not taking: Reported on 07/21/2018 07/08/18   Donne Hazel, MD  senna-docusate (SENOKOT-S) 8.6-50 MG tablet Take 1 tablet by mouth at bedtime for 30 days. Patient not taking: Reported on 07/21/2018 07/07/18 08/06/18  Donne Hazel, MD    Family History Family History  Problem Relation Age of Onset  . Endometrial cancer Sister 37  endometrial ca  . Endometrial cancer Paternal Aunt 80  . Breast cancer Paternal Aunt   . Stomach cancer Maternal Grandfather        d. 24s  . Prostate cancer Paternal Uncle   . Breast cancer Cousin        pat first cousin  . Breast cancer Cousin        pat first cousin's daughter  . Lung cancer Father        lung ca  . Heart disease Mother   . Stroke Paternal Grandmother   . Endometrial cancer Cousin        paternal first cousin's daughter  . Breast cancer Other        niece dx less than 25s  . Breast cancer Other        niece, dx under 65    Social History Social History   Tobacco Use  . Smoking status: Former Smoker    Packs/day: 1.00    Years: 55.00    Pack years: 55.00    Last attempt to quit: 10/06/2017    Years since quitting: 0.7  . Smokeless tobacco: Never Used  Substance Use Topics  . Alcohol use: Never    Frequency: Never  . Drug use: Never     Allergies   Oxycodone   Review of  Systems Review of Systems  Constitutional: Positive for fatigue. Negative for chills, diaphoresis and fever.  HENT: Positive for rhinorrhea. Negative for congestion.   Eyes: Negative for visual disturbance.  Respiratory: Positive for cough. Negative for chest tightness, shortness of breath and wheezing.   Cardiovascular: Negative for chest pain, palpitations and leg swelling.  Gastrointestinal: Positive for abdominal pain, nausea and vomiting. Negative for abdominal distention, constipation and diarrhea.  Genitourinary: Positive for flank pain. Negative for dysuria.  Musculoskeletal: Negative for neck pain.  Skin: Negative for rash.  Neurological: Negative for seizures, light-headedness and headaches.  Psychiatric/Behavioral: Positive for confusion.  All other systems reviewed and are negative.    Physical Exam Updated Vital Signs BP 111/72 (BP Location: Right Arm)   Pulse (!) 102   Temp 99.3 F (37.4 C) (Oral)   Resp 14   Ht _0  (1.6 m)   Wt 44.8 kg   SpO2 99%   BMI 17.50 kg/m   Physical Exam Vitals signs and nursing note reviewed.  Constitutional:      General: She is not in acute distress.    Appearance: She is well-developed. She is not ill-appearing, toxic-appearing or diaphoretic.  HENT:     Head: Normocephalic.     Mouth/Throat:     Pharynx: No pharyngeal swelling or oropharyngeal exudate.  Eyes:     Extraocular Movements: Extraocular movements intact.     Pupils: Pupils are equal, round, and reactive to light.  Cardiovascular:     Rate and Rhythm: Normal rate.     Heart sounds: Murmur present.  Pulmonary:     Breath sounds: Rhonchi present. No wheezing.  Chest:     Chest wall: No tenderness.  Abdominal:     General: Abdomen is flat. A surgical scar is present. Bowel sounds are normal. There is no distension.     Tenderness: There is abdominal tenderness in the right lower quadrant, suprapubic area and left lower quadrant. There is right CVA tenderness and  left CVA tenderness.  Skin:    General: Skin is warm.     Capillary Refill: Capillary refill takes less than 2 seconds.  Coloration: Skin is not pale.     Findings: No rash.  Neurological:     General: No focal deficit present.     Mental Status: She is alert.  Psychiatric:        Mood and Affect: Mood normal.      ED Treatments / Results  Labs (all labs ordered are listed, but only abnormal results are displayed) Labs Reviewed  COMPREHENSIVE METABOLIC PANEL - Abnormal; Notable for the following components:      Result Value   Sodium 132 (*)    Chloride 94 (*)    Glucose, Bld 136 (*)    BUN 27 (*)    Creatinine, Ser 1.38 (*)    Total Protein 8.5 (*)    Albumin 3.4 (*)    GFR calc non Af Amer 38 (*)    GFR calc Af Amer 44 (*)    All other components within normal limits  CBC WITH DIFFERENTIAL/PLATELET - Abnormal; Notable for the following components:   Hemoglobin 9.7 (*)    HCT 31.5 (*)    MCV 69.1 (*)    MCH 21.3 (*)    RDW 20.4 (*)    All other components within normal limits  PROTIME-INR - Abnormal; Notable for the following components:   Prothrombin Time 15.5 (*)    All other components within normal limits  CULTURE, BLOOD (ROUTINE X 2)  CULTURE, BLOOD (ROUTINE X 2)  URINE CULTURE  LACTIC ACID, PLASMA  AMMONIA  LACTIC ACID, PLASMA  URINALYSIS, ROUTINE W REFLEX MICROSCOPIC    EKG None  Radiology Dg Chest 2 View  Result Date: 07/21/2018 CLINICAL DATA:  Lethargy and weakness. EXAM: CHEST - 2 VIEW COMPARISON:  Chest x-ray 06/30/2018 FINDINGS: The heart is borderline enlarged but stable. A loop recorder is noted. Left-sided PICC line is in good position with tip in the distal SVC near the cavoatrial junction. The right-sided power port has been removed since the prior chest film. No acute pulmonary findings. No edema or effusions. No worrisome pulmonary lesions. The bony thorax is intact. IMPRESSION: 1. No acute cardiopulmonary findings. 2. Left PICC line tip  in good position. Electronically Signed   By: Marijo Sanes M.D.   On: 07/21/2018 14:15   Dg Sinus/fist Tube Chk-non Gi  Result Date: 07/20/2018 CLINICAL DATA:  Left pelvic diverticular abscess, status post percutaneous drain EXAM: ABSCESS INJECTION CONTRAST:  10 cc Omnipaque 300 FLUOROSCOPY TIME:  Fluoroscopy Time:  42 seconds Radiation Exposure Index (if provided by the fluoroscopic device): 11 Number of Acquired Spot Images: 3 COMPARISON:  05/25/2018, 06/13/2018 FINDINGS: Fluoroscopic injection performed of the left pelvic sidewall abscess. Catheter has been repositioned further away from the sigmoid colon. With slow injection, there is a small remaining fistula track to the adjacent sigmoid colon. This does appear smaller when compared to 05/25/2018. No residual abscess cavity. 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 Electronically Signed   By: Jerilynn Mages.  Shick M.D.   On: 07/20/2018 10:45   Ir Radiologist Eval & Mgmt  Result Date: 07/20/2018 Please refer to notes tab for details about interventional procedure. (Op Note)   Procedures Procedures (including critical care time)  Medications Ordered in ED Medications  iohexol (OMNIPAQUE) 300 MG/ML solution 75 mL (has no administration in time range)  sodium chloride (PF) 0.9 % injection (has no administration in time range)  sodium chloride flush (NS) 0.9 % injection 3 mL (3 mLs Intravenous Given by Other  07/21/18 1324)  sodium chloride 0.9 % bolus 500 mL (500 mLs Intravenous New Bag/Given 07/21/18 1551)  iohexol (OMNIPAQUE) 300 MG/ML solution 30 mL (30 mLs Oral Contrast Given 07/21/18 1509)     Initial Impression / Assessment and Plan / ED Course  I have reviewed the triage vital signs and the nursing notes.  Pertinent labs & imaging results that were available during my care of the patient were reviewed by me and considered in my medical decision making (see chart for  details).  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  Discussed with vascular surgery, who is concerned for aortitis, which would require extensive thoracoabdominal surgery.  PICC line and second peripheral IV in place, patient states she is feeling better, vital signs normal, awaiting further vascular surgery recommendations.  Results discussed with patient and family.   [MB]    Clinical Course User Index [MB] Maudie Flakes, MD    Jo Parker is a 73 y.o. female with a past medical history significant for prior peritoneal carcinomatosis status post surgery and chemotherapy last year, recent bacteremia sepsis last month who presents the direction of her oncology/PCP for further evaluation of altered mental status/delirium, abdominal pain, and concern for sepsis.  Patient is coming by family who reports that for the last several days patient's been fatigued and tired.  They report that she was complaining of some lower abdominal pain where her drain is.  She is also complains of bilateral flank and back pain.  She denies any urinary symptoms but they are concerned she may have urinary tract infection.  She has had a productive cough but no significant chest pain or shortness of breath.  She has not had any headache vision changes.  She has had some nausea and vomiting.  She has had a history of cerebral aneurysm and family reports that she was acting confused when she had her intracranial problems in the past.  She denies any other complaints on arrival.  Family reports she has been acting confused such as putting clothes on incorrectly, being disoriented, and acting very strangely compared to her  normal.  On exam, patient has pain in her bilateral CVA areas and flanks.  She also tenderness in the lower abdomen where her drain is.  Lungs had some coarseness but no significant wheezing.  No focal neurologic deficits.  No evidence of trauma.  Chest nontender.  Given the patient's PCP concern for delirium in the setting of sepsis, patient will have work-up to look for occult infections.  Patient will have urinalysis to look for this as her flank pain may be pyelonephritis.  She will have CT of the abdomen and pelvis with her known abscess in the abdominal pain.  She will have chest x-ray to look for pneumonia.  She will have head CT to look for altered mental status with her prior intracranial abnormalities.  Patient has no headache or neck pain, no neck stiffness.  Have low suspicion for meningitis.  Do not feel she needs lumbar puncture at this time.   Anticipate reassessment after work-up, given the concern for infection, anticipate she require admission.  Care transferred to Dr. Sedonia Small while awaiting CT  results and UA. Care transferred in stable condition.   Final Clinical Impressions(s) / ED Diagnoses   Final diagnoses:  Flank pain  Lower abdominal pain  Confusion  Altered mental status, unspecified altered mental status type  Cough  AKI (acute kidney injury) [facility])    ED Discharge Orders    None      Clinical Impression: 1. Flank pain   2. Lower abdominal pain   3. Confusion   4. Altered mental status, unspecified altered mental status type   5. Cough   6. AKI (acute kidney injury) ([place])     Disposition: Care transferred to Dr. Sedonia Small to complete workup. Anticipate admission for confusion and AMS in setting of possible infection.   This note was prepared with assistance of Systems analyst. Occasional wrong-word or sound-a-like substitutions may have occurred due to the inherent limitations of voice recognition software.      Ameia Morency, Gwenyth Allegra,  MD 07/21/18 1739    Daylen Lipsky, Gwenyth Allegra, MD 07/21/18 2116

## 2018-07-21 NOTE — ED Triage Notes (Signed)
Patient was brought to ED from cancer center. Pt presents with lethargy and weakness. Pt started having pain lower ABD yesterday. RN from cancer center stated patient has tenderness on lower ABD.   Pt is receiving treatment for stage IV ovarian cancer.

## 2018-07-22 ENCOUNTER — Telehealth: Payer: Self-pay | Admitting: Oncology

## 2018-07-22 DIAGNOSIS — F17211 Nicotine dependence, cigarettes, in remission: Secondary | ICD-10-CM

## 2018-07-22 DIAGNOSIS — R5383 Other fatigue: Secondary | ICD-10-CM | POA: Diagnosis not present

## 2018-07-22 DIAGNOSIS — C482 Malignant neoplasm of peritoneum, unspecified: Secondary | ICD-10-CM | POA: Diagnosis not present

## 2018-07-22 DIAGNOSIS — R410 Disorientation, unspecified: Secondary | ICD-10-CM

## 2018-07-22 DIAGNOSIS — R109 Unspecified abdominal pain: Secondary | ICD-10-CM | POA: Diagnosis not present

## 2018-07-22 DIAGNOSIS — Z978 Presence of other specified devices: Secondary | ICD-10-CM

## 2018-07-22 DIAGNOSIS — D638 Anemia in other chronic diseases classified elsewhere: Secondary | ICD-10-CM | POA: Diagnosis not present

## 2018-07-22 DIAGNOSIS — Z885 Allergy status to narcotic agent status: Secondary | ICD-10-CM

## 2018-07-22 DIAGNOSIS — Z8619 Personal history of other infectious and parasitic diseases: Secondary | ICD-10-CM

## 2018-07-22 DIAGNOSIS — C569 Malignant neoplasm of unspecified ovary: Secondary | ICD-10-CM

## 2018-07-22 DIAGNOSIS — Z8701 Personal history of pneumonia (recurrent): Secondary | ICD-10-CM

## 2018-07-22 DIAGNOSIS — R079 Chest pain, unspecified: Secondary | ICD-10-CM

## 2018-07-22 DIAGNOSIS — I729 Aneurysm of unspecified site: Secondary | ICD-10-CM | POA: Diagnosis not present

## 2018-07-22 LAB — CREATININE, SERUM
Creatinine, Ser: 0.73 mg/dL (ref 0.44–1.00)
GFR calc Af Amer: >60 mL/min (ref >60)
GFR calc non Af Amer: >60 mL/min (ref >60)

## 2018-07-22 LAB — CBC
HCT: 27.3 % — ABNORMAL LOW (ref 36.0–46.0)
Hemoglobin: 8.4 g/dL — ABNORMAL LOW (ref 12.0–15.0)
MCH: 20.7 pg — ABNORMAL LOW (ref 26.0–34.0)
MCHC: 30.8 g/dL (ref 30.0–36.0)
MCV: 67.4 fL — AB (ref 80.0–100.0)
Platelets: 172 10*3/uL (ref 150–400)
RBC: 4.05 MIL/uL (ref 3.87–5.11)
RDW: 20.3 % — ABNORMAL HIGH (ref 11.5–15.5)
WBC: 8.2 10*3/uL (ref 4.0–10.5)
nRBC: 0 % (ref 0.0–0.2)

## 2018-07-22 MED ORDER — VANCOMYCIN HCL IN DEXTROSE 750-5 MG/150ML-% IV SOLN: 750.0000 mg | INTRAVENOUS | Status: DC

## 2018-07-22 MED ORDER — FENTANYL CITRATE (PF) 100 MCG/2ML IJ SOLN
50.0000 ug | Freq: Once | INTRAMUSCULAR | Status: AC
  Administered 2018-07-22: 50 ug via INTRAVENOUS
  Filled 2018-07-22: qty 2

## 2018-07-22 MED ORDER — VANCOMYCIN HCL IN DEXTROSE 1-5 GM/200ML-% IV SOLN
1000.0000 mg | Freq: Once | INTRAVENOUS | Status: AC
  Administered 2018-07-22: 1000 mg via INTRAVENOUS
  Filled 2018-07-22: qty 200

## 2018-07-22 MED ORDER — LACTATED RINGERS IV SOLN
INTRAVENOUS | Status: DC
  Administered 2018-07-23 (×2): via INTRAVENOUS
  Filled 2018-07-22: qty 1000

## 2018-07-22 MED ORDER — PIPERACILLIN-TAZOBACTAM 3.375 G IVPB
3.3750 g | Freq: Three times a day (TID) | INTRAVENOUS | Status: DC
  Administered 2018-07-22 – 2018-07-23 (×4): 3.375 g via INTRAVENOUS
  Filled 2018-07-22 (×5): qty 50

## 2018-07-22 MED ORDER — SODIUM CHLORIDE 0.9 % IV SOLN
Freq: Once | INTRAVENOUS | Status: AC
  Administered 2018-07-22: 02:00:00 via INTRAVENOUS

## 2018-07-22 MED ORDER — CARVEDILOL 12.5 MG PO TABS
12.5000 mg | ORAL_TABLET | Freq: Two times a day (BID) | ORAL | Status: DC
  Administered 2018-07-22 – 2018-07-23 (×3): 12.5 mg via ORAL
  Filled 2018-07-22 (×5): qty 1

## 2018-07-22 MED ORDER — ACETAMINOPHEN 325 MG PO TABS
650.0000 mg | ORAL_TABLET | Freq: Once | ORAL | Status: AC
  Administered 2018-07-22: 650 mg via ORAL
  Filled 2018-07-22: qty 2

## 2018-07-22 MED ORDER — MORPHINE SULFATE (PF) 2 MG/ML IV SOLN
2.0000 mg | INTRAVENOUS | Status: DC | PRN
  Administered 2018-07-22 – 2018-07-23 (×3): 2 mg via INTRAVENOUS
  Filled 2018-07-22 (×3): qty 1

## 2018-07-22 MED ORDER — ENSURE MAX PROTEIN PO LIQD
11.0000 [oz_av] | Freq: Once | ORAL | Status: DC
  Filled 2018-07-22: qty 330

## 2018-07-22 NOTE — Progress Notes (Signed)
Jo Parker   DOB:[birth year]   RJ#:[id number]    Assessment & Plan:   Primary peritoneal carcinomatosis [facility]) I have reviewed her CT imaging which show no signs of definitive cancer recurrence. She is not receiving chemotherapy.  Continue supportive care only  Mycotic aneurysm Appreciate urgent surgical consult overnight.  She is in the process of being transferred to tertiary center for aggressive management.  She is on broad-spectrum IV antibiotics  History of pelvic abscess/fistula with drain placement Recent imaging study did not review abscess.  She has persistent drain in situ  Mild abdominal pain Could be related to constipation versus aneurysm.  She will continue intermittent pain medicine as needed  Anemia due to chronic illness and background of thalassemia She has multifactorial anemia. No transfusion is indicated.  Goals of care discussion The patient appears to want aggressive supportive care. Her son, [name] is her healthcare power of attorney  Discharge planning She is in the process of being transferred to [facility]. Her son will call me with updates  [name], MD 07/22/2018  7:53 AM   Subjective:  I was asked by her son to evaluate the patient in the emergency room.  The patient is well-known to me.  She was seen by GYN surgeon yesterday who directed her to the ER due to clinical signs of sepsis.  Overnight, she underwent extensive evaluation including repeat CT scan of the abdomen and pelvis which reviewed new mycotic aneurysm.  Urgent vascular surgery consult was obtained and the overall plan is to transfer her to tertiary center for surgical treatment. Overnight, she is stabilized.  Her altered mental status has improved.  She was commenced on broad-spectrum intravenous antibiotics. Currently, she complained of mild intermittent lower back pain but stable on current prescribed pain medicine.  Objective:  Vitals:   07/22/18  0715 07/22/18 0730  BP: 130/73 130/70  Pulse: 93   Resp: (!) 22 (!) 21  Temp:    SpO2: 99% 100%     Intake/Output Summary (Last 24 hours) at 07/22/2018 0753 Last data filed at 07/22/2018 0241 Gross per 24 hour  Intake 1307.69 ml  Output -  Net 1307.69 ml    GENERAL:alert, no distress and comfortable.  She looks cachectic and frail SKIN: skin color, texture, turgor are normal, no rashes or significant lesions EYES: normal, Conjunctiva are pink and non-injected, sclera clear OROPHARYNX:no exudate, no erythema and lips, buccal mucosa, and tongue normal  NECK: supple, thyroid normal size, non-tender, without nodularity LYMPH:  no palpable lymphadenopathy in the cervical, axillary or inguinal LUNGS: clear to auscultation and percussion with normal breathing effort HEART: regular rate & rhythm and no murmurs and no lower extremity edema ABDOMEN:abdomen soft, mild tenderness on palpation on right lower quadrant without rebound no guarding Musculoskeletal:no cyanosis of digits and no clubbing  NEURO: alert & oriented x 3 with fluent speech, no focal motor/sensory deficits   Labs:  Lab Results  Component Value Date   WBC 8.2 07/22/2018   HGB 8.4 (L) 07/22/2018   HCT 27.3 (L) 07/22/2018   MCV 67.4 (L) 07/22/2018   PLT 172 07/22/2018   NEUTROABS 5.7 07/21/2018    Lab Results  Component Value Date   NA 132 (L) 07/21/2018   K 3.6 07/21/2018   CL 94 (L) 07/21/2018   CO2 25 07/21/2018    Studies:  Dg Chest 2 View  Result Date: 07/21/2018 CLINICAL DATA:  Lethargy and weakness. EXAM: CHEST - 2 VIEW COMPARISON:  Chest  x-ray 06/30/2018 FINDINGS: The heart is borderline enlarged but stable. A loop recorder is noted. Left-sided PICC line is in good position with tip in the distal SVC near the cavoatrial junction. The right-sided power port has been removed since the prior chest film. No acute pulmonary findings. No edema or effusions. No worrisome pulmonary lesions. The bony thorax is  intact. IMPRESSION: 1. No acute cardiopulmonary findings. 2. Left PICC line tip in good position. Electronically Signed   By: Marijo Sanes M.D.   On: 07/21/2018 14:15   Ct Head Wo Contrast  Result Date: 07/21/2018 CLINICAL DATA:  Altered level of consciousness. EXAM: CT HEAD WITHOUT CONTRAST TECHNIQUE: Contiguous axial images were obtained from the base of the skull through the vertex without intravenous contrast. COMPARISON:  CT scan of June 30, 2018. FINDINGS: Brain: Mild chronic ischemic white matter disease is noted. No mass effect or midline shift is noted. Ventricular size is within normal limits. There is no evidence of mass lesion, hemorrhage or acute infarction. Vascular: No hyperdense vessel or unexpected calcification. Skull: Left frontal craniotomy is again noted. No acute abnormality is noted in the skull. Sinuses/Orbits: No acute finding. Other: Stable aneurysm clip is noted in the region of the left MCA. IMPRESSION: Mild chronic ischemic white matter disease. Stable aneurysm clip in left MCA territory. No acute intracranial abnormality seen. Electronically Signed   By: Marijo Conception, M.D.   On: 07/21/2018 20:04   Ct Abdomen Pelvis W Contrast  Result Date: 07/21/2018 CLINICAL DATA:  Generalized abdominal pain. Prior pelvic abscess with drain remaining in place. Decreased drainage. EXAM: CT ABDOMEN AND PELVIS WITH CONTRAST TECHNIQUE: Multidetector CT imaging of the abdomen and pelvis was performed using the standard protocol following bolus administration of intravenous contrast. CONTRAST:  65mL OMNIPAQUE IOHEXOL 300 MG/ML SOLN, 54mL OMNIPAQUE IOHEXOL 300 MG/ML SOLN COMPARISON:  06/30/2018 FINDINGS: Lower chest: Small right pleural effusion, stable. Bilateral lower lobe opacities, likely atelectasis. Hepatobiliary: No focal hepatic abnormality. Gallbladder unremarkable. Pancreas: No focal abnormality or ductal dilatation. Spleen: No focal abnormality.  Normal size. Adrenals/Urinary Tract:  Small cyst in the lower pole of the left kidney, stable. No renal or adrenal mass otherwise. No hydronephrosis. Urinary bladder unremarkable. Stomach/Bowel: Extensive sigmoid diverticulosis and wall thickening. No visible active diverticulitis. Large stool burden in the colon. No evidence of bowel obstruction. 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 This is best visualized and measured on coronal image 50. This occurs at the aortic hiatus. This contains contrast material and moderate thrombus. Along the left side of the upper abdominal aorta is a stable saccular aneurysm which measures 3.7 cm in craniocaudal length on coronal image 47. Heavily calcified aorta and iliac vessels. No adenopathy. Reproductive: Prior hysterectomy.  No adnexal masses. Other: Left pelvic drainage catheter remains in place, unchanged. No visible residual fluid collection. No free fluid or free air. Left midline ventral hernia noted in the infraumbilical region containing small bowel loop. No evidence of obstruction. Musculoskeletal: No acute bony abnormality. 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 This has rapidly developed since recent study. Recommend vascular surgical consultation. Sigmoid diverticulosis. Pelvic drainage catheter remains in the left side of the pelvis along the pelvic sidewall. No  visible residual fluid collection. Large stool burden in the colon. Small right pleural effusion.  Bibasilar atelectasis. These results were called by telephone at the time of interpretation on 07/21/2018 at 8:10 pm to Dr. Marda Stalker , who verbally acknowledged these results. Electronically Signed   By: Rolm Baptise M.D.   On:  07/21/2018 20:13   Dg Sinus/fist Tube Chk-non Gi  Result Date: 07/20/2018 CLINICAL DATA:  Left pelvic diverticular abscess, status post percutaneous drain EXAM: ABSCESS INJECTION CONTRAST:  10 cc Omnipaque 300 FLUOROSCOPY TIME:  Fluoroscopy Time:  42 seconds Radiation Exposure Index (if provided by the fluoroscopic device): 11 Number of Acquired Spot Images: 3 COMPARISON:  05/25/2018, 06/13/2018 FINDINGS: Fluoroscopic injection performed of the left pelvic sidewall abscess. Catheter has been repositioned further away from the sigmoid colon. With slow injection, there is a small remaining fistula track to the adjacent sigmoid colon. This does appear smaller when compared to 05/25/2018. No residual abscess cavity. 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 Electronically Signed   By: Jerilynn Mages.  Shick M.D.   On: 07/20/2018 10:45   Ir Radiologist Eval & Mgmt  Result Date: 07/20/2018 Please refer to notes tab for details about interventional procedure. (Op Note)

## 2018-07-22 NOTE — Care Plan (Signed)
received phone call from dr Colvin Caroli wanting [facility] to admit this pt to await for a bed at [facility], [facility] or [facility] (she is on bed waiting list for all, may be days before she gets one). pt and family still wanting transfer as they are not satisfied with what our vascular team has offered them. I have spoken to dr fields with the vascular team who again reiterates there is not much they can do or offer and the pt needs to be transferred and that IF we admit her to [facility] this will delay her getting a bed elsewhere. suggested doing an ED to ED transfer which dr Colvin Caroli reports is not possible for unclear reasons. dr fields also suggested the ED do an ED to ED transfer. I have also discussed with [facility] [place] dr Sarajane Jews who reiterates the same advice.   I advised IF the situation changes such as the pt does not want more aggressive care or wishes to proceed with our vascular team plan or to go toward more conservative measures/comfort care than we would be more than willing to admit her at that time. However since we cannot provide the service the pt desires, the best and safest plan for the patient is to await bed at tertiary care facility in the ED (solely because admitting to a facility that cannot provide needed services delays patient priority in bed placement at outside facility).  Vascular team will be available if any signs or symptoms of aortic rupture develop.  Medical care is being provided in the ED appropriately including abx and pain management.

## 2018-07-22 NOTE — ED Notes (Signed)
Call to [facility] at [phone] spoke with intake person who  States no beds available. Also call to [facility] at [phone] no beds available.

## 2018-07-22 NOTE — ED Notes (Signed)
[facility] called and said this patient is still on the list, but no bed at this time

## 2018-07-22 NOTE — ED Provider Notes (Addendum)
I have had a call from [facility] at 18: 32.  Notified that vascular surgeon Dr. Rosana Hoes who is familiar with the case and has reviewed the chart advises that there is no bed availability at this time and per transfer center may have delayed transfer until Monday.  I have updated the patient and the family members regarding this at length of delay of transfer and reviewed the options of potentially reconsulting vascular surgery here or admission for continued comfort care with pain control and medications as established until bed availability at [facility].  After discussion with multiple family members and patient, their decision is to have admission to hospital with ongoing medical management and await transfer for tertiary care center.  Patient is alert and appropriate.  Vital signs are stable.  Cognitive function intact.  Appropriate for decision-making.   Charlesetta Shanks, MD 07/22/18 1914  Consult: Reviewed with Dr.David, who was resistant to admitting the patient due to patient awaiting transfer and Dr. Shanon Brow being of the opinion that admission would delay transfer. She told me to do an ED to ED  transfer.  I advised her that [facility] is full at this time and since the patient is not being taken on emergent basis to their OR, and as there is thoracic surgery available at this facility, ED to ED transfer without the thoracic surgeon accepting transfer to the ED is not possible. Dr. Rosana Hoes advised will accept when there is bed availability, which at this time is likely to be Monday.  I consulted Dr. Oneida Alar per Dr. Camelia Eng request to make sure vascular was still agreeable to be consultant on the case during admission. I consulted Fields and he agreed to vascular remaining on the case during patient's admission. I messaged Dr. Shanon Brow to that effect, noting that if surgery should occur under emergent circumstances, it would be best to have patient at [facility]. She subsequently called me to advise  that she called Dr. Oneida Alar after I had spoken to him and now he doesn't think admission here is appropriate (per Dr. Shanon Brow). Again, wants an ED to ED transfer.  Will request involvement of administration to facilitate disposition for patient with critical illness and difficult disposition. In the mean time, I'm addressing pain control and fluids. Scheduled antibiotics ordered. I have been communicating with family regarding delay in transfer due to lack of bed availability.   Charlesetta Shanks, MD 07/22/18 2106 Consult: Have reviewed disposition issues with Skip Hislop, .AC. He will evaluate for placement resolution.   Charlesetta Shanks, MD 07/22/18 2212 Consult: Denton Lank has had a  repeat conversation with Dr. Shanon Brow and has been advised this has been extensively reviewed with Dr. Sarajane Jews, the hospitalist chief, who is in agreement that the patient's best management is to stay in the emergency department until patient can be transferred to tertiary care facility which may be 2 days.   Charlesetta Shanks, MD 07/22/18 2317

## 2018-07-22 NOTE — ED Notes (Signed)
This nurse attempted to call [facility] to see if they had a bed available yet. No response

## 2018-07-22 NOTE — ED Notes (Signed)
Called [facility] Vascular surgery for Dr Sedonia Small @2254  [place]. Vascular Surgery for Dr Sedonia Small @2335 

## 2018-07-22 NOTE — Progress Notes (Signed)
Pharmacy Antibiotic Note  Jo Parker is a 73 y.o. female admitted on 07/21/2018 with mycotic aortic aneurysm.  Pharmacy has been consulted for Vancomycin and Zosyn dosing.  Plan: Zosyn 3.375g IV q8h (4 hour infusion).  Vancomycin 1gm iv x1 (done in ED 2/14 @ 0059), then Vancomycin 750 mg IV Q 48 hrs, with next dose due 2/15 at 2200  Goal AUC 400-550. Expected AUC: 452 SCr used: 1.38   Height: 5\' 3"  (160 cm) Weight: 98 lb 12.8 oz (44.8 kg) IBW/kg (Calculated) : 52.4  Temp (24hrs), Avg:99.4 F (37.4 C), Min:98.4 F (36.9 C), Max:100.4 F (38 C)  Recent Labs  Lab 07/19/18 1157 07/21/18 1321 07/21/18 1324  WBC 5.7  --  8.1  CREATININE 0.80  --  1.38*  LATICACIDVEN  --  1.5  --     Estimated Creatinine Clearance: 25.7 mL/min (A) (by C-G formula based on SCr of 1.38 mg/dL (H)).    Allergies  Allergen Reactions  . Oxycodone Other (See Comments)    confusion    Antimicrobials this admission: Vancomycin 07/22/2018 >> Zosyn 07/21/2018 >>   Dose adjustments this admission: -  Microbiology results: -  Thank you for allowing pharmacy to be a part of this patient's care.  Nani Skillern Crowford 07/22/2018 1:42 AM

## 2018-07-22 NOTE — ED Provider Notes (Addendum)
  Provider Note MRN:  [id number]  Arrival date & time: 07/22/18    ED Course and Medical Decision Making  I received sign out for this patient at shift change from Dr. Sherry Ruffing.  Clinical Course as of Jul 22 28  Thu Jul 21, 2018     2050 Called by radiology to be informed of new large saccular aneurysm of the proximal abdominal aorta.  Discussed with vascular surgery, who is concerned for aortitis, which would require extensive thoracoabdominal surgery.  PICC line and second peripheral IV in place, patient states she is feeling better, vital signs normal, awaiting further vascular surgery recommendations.  Results discussed with patient and family.   [MB]    Clinical Course User Index [MB] Maudie Flakes, MD    Long discussions with vascular surgery, patient, and patient's family.  Per Dr. Monica Martinez of vascular surgery this is likely a terminal condition, given her age and comorbidities is not a candidate for open surgery and definitive repair.  Was offered graft placement as a temporizing measure by vascular surgery, was also offered palliative care and antibiotics.  Patient requesting second opinion from an academic institution.  Discussed transfer options with both [facility] and [facility], both of which have wait lists for beds in the vascular surgery units.  [facility] has the shortest wait list and is anticipating a bed to be made available tomorrow afternoon.  Accepting physician Dr. Radene Knee, will attempt admission to [facility] tonight to facilitate transfer tomorrow.  Update 1:32 AM Difficulty obtaining acceptance for admission by hospitalist doctors given that patient has a planned transfer tomorrow afternoon to [facility], Dr. Radene Knee of [facility] vascular surgery is the accepting physician, I am told she is on the waiting list but we are anticipating beds to become available tomorrow morning after discharges.  Pharmacy consulted to aid with scheduled antibiotics,  maintenance fluids ordered, patient and family made aware.  Critical Care Documentation Critical care time provided by me (excluding procedures): 79 minutes  Condition necessitating critical care: Mycotic aortic aneurysm, sepsis  Components of critical care management: reviewing of prior records, laboratory and imaging interpretation, frequent re-examination and reassessment of vital signs, administration of IV fluids, IV antibiotics, discussion with consulting services, care discussions with family, facilitation of transfer   Barth Kirks. Sedonia Small, [place] [email or web address]    Maudie Flakes, MD 07/22/18 0034    Maudie Flakes, MD 07/22/18 0133    Maudie Flakes, MD 07/22/18 [phone]

## 2018-07-22 NOTE — ED Provider Notes (Signed)
Asked to see the patient by nursing staff.  Patient awaiting transport to [facility].  She has been complaining of back and flank pain.  I reexamined the patient.  She is sitting up in the bed and does not look uncomfortable.  Vital signs are unchanged.  She has a very slight tachycardia around 100 bpm, normotensive.  She reports that the pain she is experiencing is the same pain that she had that brought her to the ER.  It improved when she was given pain medicine earlier but is now returning.  I had a conversation with the patient and her son.  I wanted to be certain that they understood the risk of waiting for transfer.  I did inform them that if this pain is the first sign of rupture, that she likely will die.  They still feel that they want to wait for a second opinion at academic institution, do not want to cycle back and consider graft intervention here.  They understand the risk of death.   Orpah Greek, MD 07/22/18 [phone]

## 2018-07-22 NOTE — Consult Note (Signed)
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  It is likely due to MSSA given her recent bacteremia but I agree with broader, empiric therapy given her intra-abdominal pathology.  Plan: 1. Continue current antibiotics 2. Please call Dr. 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 carvedilol  12.5 mg Oral BID WC  . fentaNYL (SUBLIMAZE) injection  50 mcg Intravenous Once   Continuous Infusions: . piperacillin-tazobactam (ZOSYN)  IV 3.375 g (07/22/18 1340)  . [START ON 07/23/2018] vancomycin     PRN Meds:.  HPI: Jo Parker is a 73 y.o. female with ovarian cancer.  She is well-known to our service.  She was hospitalized last fall with peritonitis and pneumonia following debulking abdominal surgery.  She was readmitted in January with MSSA bacteremia from her Port-A-Cath.  Her Port-A-Cath was removed.  Repeat blood cultures were negative and there was no evidence of endocarditis by TEE.  She was treated with IV cefazolin for 2 weeks via a PICC, completing therapy on 07/17/2018.  Her fevers resolved.  However over the past 4 to 5 days she has developed right-sided chest pain, lethargy and confusion leading to readmission last night.  Repeat abdominal CT scan showed:  "The previously seen 3.8 cm saccular aneurysm off the left side of the proximal abdominal  aorta is stable. 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 This has rapidly developed since recent study. Recommend vascular surgical Consultation."  She was started back on broad empiric antibiotic therapy.  Dr. Carlis Abbott, vascular surgeon, evaluated her last night and reviewed how very difficult and dangerous this situation is for her.  The family has requested a second opinion evaluation at an academic [facility].  She is awaiting transfer once a bed is available.  Review of Systems: Review of Systems  Constitutional: Positive for fever, malaise/fatigue and weight loss. Negative for chills and diaphoresis.  Cardiovascular: Positive for chest pain.  Skin: Negative for rash.    Past Medical History:  Diagnosis Date  . Acute on chronic respiratory failure with hypoxia ([place]) 03/07/2018  . Anxiety and depression   . Aortic aneurysm ([place])    see ov note 03/2017   . Arthritis   . Aspiration pneumonia ([place])   . Carcinomatosis ([place]) 10/2017  . CHF (congestive heart failure) ([name])    diastolic dysfunction  . COPD (chronic obstructive pulmonary disease) ([place]) 10/23/2017  . Depression   . Family history of breast cancer   . Family history of prostate cancer   . Family history of uterine cancer   . History of diverticulitis   . HLD (hyperlipidemia) 10/23/2017  . Hypercholesterolemia   .  Hypertension   . Mitral valve prolapse   . Pelvic abscess in female   . Sleep apnea    cpap machine mask and tubing in [place] per patient.  Patient does not have mask or tubing to bring on DOS of 01/27/2018.   Marland Kitchen Uterine cancer ([place])    had partial hysterectomy at 69    Social History   Tobacco Use  . Smoking status: Former Smoker    Packs/day: 1.00    Years: 55.00    Pack years: 55.00    Last attempt to quit: 10/06/2017    Years since quitting: 0.7  . Smokeless tobacco: Never Used  Substance Use Topics  .  Alcohol use: Never    Frequency: Never  . Drug use: Never    Family History  Problem Relation Age of Onset  . Endometrial cancer Sister 45       endometrial ca  . Endometrial cancer Paternal Aunt 80  . Breast cancer Paternal Aunt   . Stomach cancer Maternal Grandfather        d. 60s  . Prostate cancer Paternal Uncle   . Breast cancer Cousin        pat first cousin  . Breast cancer Cousin        pat first cousin's daughter  . Lung cancer Father        lung ca  . Heart disease Mother   . Stroke Paternal Grandmother   . Endometrial cancer Cousin        paternal first cousin's daughter  . Breast cancer Other        niece dx less than 40s  . Breast cancer Other        niece, dx under 59   Allergies  Allergen Reactions  . Oxycodone Other (See Comments)    confusion    OBJECTIVE: Blood pressure 112/62, pulse 84, temperature (!) 100.4 F (38 C), temperature source Rectal, resp. rate 14, height 5\' 3"  (1.6 m), weight 44.8 kg, SpO2 100 %.  Physical Exam Constitutional:      Comments: She is thin and frail.  She is resting quietly in bed.  She does not recall any discussion about an aneurysm.  Her family is at the bedside.  Cardiovascular:     Rate and Rhythm: Normal rate and regular rhythm.     Heart sounds: No murmur.  Pulmonary:     Effort: Pulmonary effort is normal.     Breath sounds: Normal breath sounds.     Comments: Her previous right chest Port-A-Cath site is healing nicely. Abdominal:     Palpations: Abdomen is soft.     Tenderness: There is no abdominal tenderness.     Comments: She has a chronic pelvic drain in place.     Lab Results Lab Results  Component Value Date   WBC 8.2 07/22/2018   HGB 8.4 (L) 07/22/2018   HCT 27.3 (L) 07/22/2018   MCV 67.4 (L) 07/22/2018   PLT 172 07/22/2018    Lab Results  Component Value Date   CREATININE 0.73 07/22/2018   BUN 27 (H) 07/21/2018   NA 132 (L) 07/21/2018   K 3.6 07/21/2018   CL 94 (L) 07/21/2018   CO2  25 07/21/2018    Lab Results  Component Value Date   ALT 10 07/21/2018   AST 23 07/21/2018   ALKPHOS 62 07/21/2018   BILITOT 0.7 07/21/2018     Microbiology: Recent Results (from the past 240 hour(s))  Culture,  blood (Routine x 2)     Status: None (Preliminary result)   Collection Time: 07/21/18  1:21 PM  Result Value Ref Range Status   Specimen Description   Final    BLOOD LEFT HAND Performed at [facility] [address] [place], [place] [address]    Special Requests   Final    BOTTLES DRAWN AEROBIC AND ANAEROBIC Blood Culture results may not be optimal due to an inadequate volume of blood received in culture bottles Performed at [facility] [address], [place], [place] [address]    Culture   Final    NO GROWTH < 24 HOURS Performed at [facility] [address] [place], [place] [address]    Report Status PENDING  Incomplete  Culture, blood (Routine x 2)     Status: None (Preliminary result)   Collection Time: 07/21/18  2:20 PM  Result Value Ref Range Status   Specimen Description BLOOD RIGHT ANTECUBITAL  Final   Special Requests   Final    BOTTLES DRAWN AEROBIC AND ANAEROBIC Blood Culture adequate volume Performed at [facility] [address], [place], [place] [address]    Culture   Final    NO GROWTH < 24 HOURS Performed at [facility] [address] [place], [place] [address]    Report Status PENDING  Incomplete    Michel Bickers, MD [facility] [phone] pager   [phone] cell 07/22/2018, 3:53 PM

## 2018-07-22 NOTE — Telephone Encounter (Signed)
[name] called and wanted to make sure that Dr. Skeet Latch is notified of Keatyn's condition and results.  Advised him that we will let her know.

## 2018-07-23 ENCOUNTER — Encounter (HOSPITAL_COMMUNITY): Payer: Self-pay | Admitting: Internal Medicine

## 2018-07-23 DIAGNOSIS — F419 Anxiety disorder, unspecified: Secondary | ICD-10-CM | POA: Diagnosis not present

## 2018-07-23 DIAGNOSIS — E785 Hyperlipidemia, unspecified: Secondary | ICD-10-CM | POA: Diagnosis not present

## 2018-07-23 DIAGNOSIS — I729 Aneurysm of unspecified site: Secondary | ICD-10-CM | POA: Diagnosis not present

## 2018-07-23 DIAGNOSIS — I714 Abdominal aortic aneurysm, without rupture: Secondary | ICD-10-CM | POA: Diagnosis not present

## 2018-07-23 DIAGNOSIS — C569 Malignant neoplasm of unspecified ovary: Secondary | ICD-10-CM | POA: Diagnosis not present

## 2018-07-23 DIAGNOSIS — J449 Chronic obstructive pulmonary disease, unspecified: Secondary | ICD-10-CM | POA: Diagnosis not present

## 2018-07-23 DIAGNOSIS — I5032 Chronic diastolic (congestive) heart failure: Secondary | ICD-10-CM | POA: Diagnosis not present

## 2018-07-23 DIAGNOSIS — D569 Thalassemia, unspecified: Secondary | ICD-10-CM | POA: Diagnosis not present

## 2018-07-23 DIAGNOSIS — D638 Anemia in other chronic diseases classified elsewhere: Secondary | ICD-10-CM | POA: Diagnosis not present

## 2018-07-23 DIAGNOSIS — F329 Major depressive disorder, single episode, unspecified: Secondary | ICD-10-CM | POA: Diagnosis not present

## 2018-07-23 DIAGNOSIS — D509 Iron deficiency anemia, unspecified: Secondary | ICD-10-CM | POA: Diagnosis not present

## 2018-07-23 DIAGNOSIS — R7881 Bacteremia: Secondary | ICD-10-CM | POA: Diagnosis not present

## 2018-07-23 DIAGNOSIS — Z9079 Acquired absence of other genital organ(s): Secondary | ICD-10-CM | POA: Diagnosis not present

## 2018-07-23 DIAGNOSIS — I341 Nonrheumatic mitral (valve) prolapse: Secondary | ICD-10-CM | POA: Diagnosis not present

## 2018-07-23 DIAGNOSIS — G4733 Obstructive sleep apnea (adult) (pediatric): Secondary | ICD-10-CM | POA: Diagnosis not present

## 2018-07-23 DIAGNOSIS — Z87891 Personal history of nicotine dependence: Secondary | ICD-10-CM | POA: Diagnosis not present

## 2018-07-23 DIAGNOSIS — B9561 Methicillin susceptible Staphylococcus aureus infection as the cause of diseases classified elsewhere: Secondary | ICD-10-CM | POA: Diagnosis not present

## 2018-07-23 DIAGNOSIS — R1030 Lower abdominal pain, unspecified: Secondary | ICD-10-CM | POA: Diagnosis present

## 2018-07-23 DIAGNOSIS — I7102 Dissection of abdominal aorta: Secondary | ICD-10-CM | POA: Diagnosis not present

## 2018-07-23 DIAGNOSIS — I671 Cerebral aneurysm, nonruptured: Secondary | ICD-10-CM | POA: Insufficient documentation

## 2018-07-23 DIAGNOSIS — R4182 Altered mental status, unspecified: Secondary | ICD-10-CM | POA: Diagnosis not present

## 2018-07-23 DIAGNOSIS — R627 Adult failure to thrive: Secondary | ICD-10-CM | POA: Diagnosis not present

## 2018-07-23 DIAGNOSIS — C786 Secondary malignant neoplasm of retroperitoneum and peritoneum: Secondary | ICD-10-CM | POA: Diagnosis not present

## 2018-07-23 DIAGNOSIS — N179 Acute kidney failure, unspecified: Secondary | ICD-10-CM | POA: Diagnosis not present

## 2018-07-23 DIAGNOSIS — Z90710 Acquired absence of both cervix and uterus: Secondary | ICD-10-CM | POA: Diagnosis not present

## 2018-07-23 DIAGNOSIS — I110 Hypertensive heart disease with heart failure: Secondary | ICD-10-CM | POA: Diagnosis not present

## 2018-07-23 DIAGNOSIS — Z681 Body mass index (BMI) 19 or less, adult: Secondary | ICD-10-CM | POA: Diagnosis not present

## 2018-07-23 LAB — COMPREHENSIVE METABOLIC PANEL
ALT: 15 U/L (ref 0–44)
AST: 27 U/L (ref 15–41)
Albumin: 2.3 g/dL — ABNORMAL LOW (ref 3.5–5.0)
Alkaline Phosphatase: 64 U/L (ref 38–126)
Anion gap: 9 (ref 5–15)
BUN: 6 mg/dL — ABNORMAL LOW (ref 8–23)
CHLORIDE: 100 mmol/L (ref 98–111)
CO2: 26 mmol/L (ref 22–32)
Calcium: 8.1 mg/dL — ABNORMAL LOW (ref 8.9–10.3)
Creatinine, Ser: 0.65 mg/dL (ref 0.44–1.00)
GFR calc Af Amer: >60 mL/min (ref >60)
Glucose, Bld: 112 mg/dL — ABNORMAL HIGH (ref 70–99)
Potassium: 2.9 mmol/L — ABNORMAL LOW (ref 3.5–5.1)
Sodium: 135 mmol/L (ref 135–145)
Total Bilirubin: 0.6 mg/dL (ref 0.3–1.2)
Total Protein: 6.5 g/dL (ref 6.5–8.1)

## 2018-07-23 LAB — URINE CULTURE: CULTURE: NO GROWTH

## 2018-07-23 LAB — TYPE AND SCREEN
ABO/RH(D): B POS
Antibody Screen: NEGATIVE

## 2018-07-23 LAB — CBC
HCT: 26.6 % — ABNORMAL LOW (ref 36.0–46.0)
Hemoglobin: 8.2 g/dL — ABNORMAL LOW (ref 12.0–15.0)
MCH: 20.9 pg — ABNORMAL LOW (ref 26.0–34.0)
MCHC: 30.8 g/dL (ref 30.0–36.0)
MCV: 67.9 fL — ABNORMAL LOW (ref 80.0–100.0)
PLATELETS: 180 10*3/uL (ref 150–400)
RBC: 3.92 MIL/uL (ref 3.87–5.11)
RDW: 20.2 % — ABNORMAL HIGH (ref 11.5–15.5)
WBC: 6.2 10*3/uL (ref 4.0–10.5)
nRBC: 0 % (ref 0.0–0.2)

## 2018-07-23 LAB — MAGNESIUM: Magnesium: 1.4 mg/dL — ABNORMAL LOW (ref 1.7–2.4)

## 2018-07-23 LAB — PROTIME-INR
INR: 1.52
Prothrombin Time: 18.1 seconds — ABNORMAL HIGH (ref 11.4–15.2)

## 2018-07-23 LAB — MRSA PCR SCREENING: MRSA by PCR: NEGATIVE

## 2018-07-23 MED ORDER — GENERIC EXTERNAL MEDICATION
750.00 | Status: DC
Start: 2018-07-25 — End: 2018-07-23

## 2018-07-23 MED ORDER — ESMOLOL HCL-SODIUM CHLORIDE 2500 MG/250ML IV SOLN
0.00 | INTRAVENOUS | Status: DC
Start: ? — End: 2018-07-23

## 2018-07-23 MED ORDER — FENTANYL CITRATE (PF) 50 MCG/ML IJ SOLN
50.00 | INTRAMUSCULAR | Status: DC
Start: ? — End: 2018-07-23

## 2018-07-23 MED ORDER — SODIUM CHLORIDE 0.9% FLUSH
3.0000 mL | Freq: Two times a day (BID) | INTRAVENOUS | Status: DC
  Administered 2018-07-23: 3 mL via INTRAVENOUS

## 2018-07-23 MED ORDER — ACETAMINOPHEN 10 MG/ML IV SOLN
1000.00 | INTRAVENOUS | Status: DC
Start: ? — End: 2018-07-23

## 2018-07-23 MED ORDER — ONDANSETRON HCL 4 MG/2ML IJ SOLN: 4.0000 mg | Freq: Four times a day (QID) | INTRAMUSCULAR | Status: DC | PRN

## 2018-07-23 MED ORDER — ACETAMINOPHEN 325 MG PO TABS
650.00 | ORAL_TABLET | ORAL | Status: DC
Start: ? — End: 2018-07-23

## 2018-07-23 MED ORDER — CLEVIDIPINE 25 MG/50ML IV EMUL
.00 | INTRAVENOUS | Status: DC
Start: ? — End: 2018-07-23

## 2018-07-23 MED ORDER — OXYCODONE HCL 5 MG PO TABS
5.00 | ORAL_TABLET | ORAL | Status: DC
Start: ? — End: 2018-07-23

## 2018-07-23 MED ORDER — ACETAMINOPHEN 650 MG RE SUPP: 650.0000 mg | Freq: Four times a day (QID) | RECTAL | Status: DC | PRN

## 2018-07-23 MED ORDER — FENTANYL CITRATE (PF) 100 MCG/2ML IJ SOLN
50.0000 ug | INTRAMUSCULAR | Status: DC | PRN
  Administered 2018-07-23: 50 ug via INTRAVENOUS
  Filled 2018-07-23: qty 2

## 2018-07-23 MED ORDER — ONDANSETRON HCL 4 MG PO TABS: 4.0000 mg | ORAL_TABLET | Freq: Four times a day (QID) | ORAL | Status: DC | PRN

## 2018-07-23 MED ORDER — PIPERACILLIN-TAZOBACTAM 3.375 G IVPB
4.50 | INTRAVENOUS | Status: DC
Start: 2018-07-27 — End: 2018-07-23

## 2018-07-23 MED ORDER — ACETAMINOPHEN 325 MG PO TABS: 650.0000 mg | ORAL_TABLET | Freq: Four times a day (QID) | ORAL | Status: DC | PRN

## 2018-07-23 MED ORDER — VANCOMYCIN HCL IN DEXTROSE 1-5 GM/200ML-% IV SOLN
1000.00 | INTRAVENOUS | Status: DC
Start: 2018-07-23 — End: 2018-07-23

## 2018-07-23 MED ORDER — SODIUM CHLORIDE 0.9 % IV SOLN
10.00 | INTRAVENOUS | Status: DC
Start: ? — End: 2018-07-23

## 2018-07-23 MED ORDER — ADULT MULTIVITAMIN W/MINERALS CH
1.0000 | ORAL_TABLET | Freq: Every day | ORAL | Status: DC
  Administered 2018-07-23: 1 via ORAL
  Filled 2018-07-23: qty 1

## 2018-07-23 MED ORDER — MAGNESIUM OXIDE 400 (241.3 MG) MG PO TABS
400.0000 mg | ORAL_TABLET | Freq: Every day | ORAL | Status: DC
  Administered 2018-07-23: 400 mg via ORAL
  Filled 2018-07-23: qty 1

## 2018-07-23 MED ORDER — DOCUSATE SODIUM 100 MG PO CAPS
100.0000 mg | ORAL_CAPSULE | Freq: Two times a day (BID) | ORAL | Status: DC
  Administered 2018-07-23: 100 mg via ORAL
  Filled 2018-07-23: qty 1

## 2018-07-23 MED ORDER — FENTANYL CITRATE (PF) 100 MCG/2ML IJ SOLN
50.0000 ug | INTRAMUSCULAR | Status: DC | PRN
  Administered 2018-07-23 (×2): 50 ug via INTRAVENOUS
  Filled 2018-07-23: qty 2

## 2018-07-23 MED ORDER — MIRTAZAPINE 7.5 MG PO TABS: 30.0000 mg | ORAL_TABLET | Freq: Every day | ORAL | Status: DC

## 2018-07-23 MED ORDER — OXYCODONE HCL 5 MG PO TABS
10.00 | ORAL_TABLET | ORAL | Status: DC
Start: ? — End: 2018-07-23

## 2018-07-23 NOTE — ED Notes (Signed)
Placed on hospital bed for comfort. Purewick placed. Husband given recliner chair. Both patient and family resting comfortably.

## 2018-07-23 NOTE — H&P (Addendum)
History and Physical    Jo Parker YIR:[id number] DOB: March 14, 1946 DOA: 07/21/2018  PCP: [name], MD; her primary care was in [place] but she has been in [place] since last March Consultants:  Skeet Latch - surgery Patient coming from:  Home - lives with son, his wife, his children; NOK: Son, [phone]  Chief Complaint: Abdominal pain  HPI: Jo Parker is a 73 y.o. 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  On Thursday, she saw her surgeon.  Prior to that, she had some concerns about RUQ and RLQ pain with radiation into the back and abdomen.  The previous Monday she had been for blood work and they had a radiology appointment Tuesday with xrays.  Wednesday, they had called to notify the doctor of pain and she was seen Thursday and they sent her to the ER.  That night, in the ER, they found the AAA which had ballooned to 5 cm and they reported needing to do something right away.  Her son called her niece, who is a neurologist - she recommended transfer to an academic center.  They have been trying to get her transferred since.  The neurologist recommended we call the transfer center.  Her son understood Dr. Carlis Abbott to explain that he wasn't really sure about which plan they should follow - open procedure is extremely aggressive and channeling is still aggressive but not as much.  She is hurting now.  She is continuing to have the same pain that brought her in along her right side.  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  Options presented to the family were: 1- IV antibiotics and conservative measures with engagement with palliative care since this is likely to result in a terminal rupture; 2- TEVAR via transfemoral assess as a temporizing measure to cover the distal thoracic and supraceliac aorta with suppressive lifelong antibiotics.  Dr. Carlis Abbott did not believe that the patient was a candidate for an open repair.  Based on this discussion, which occurred late in the evening on 2/13, the family requested transfer to an academic medical center and was placed on a list for transfer to either [facility] or [facility]  Per notes from ER physicians subsequently, the family is aware of the risk of death associated with watchful waiting and continues to decline intervention here at [facility] while awaiting transfer to [facility].  She remains on broad-spectrum antibiotics.  She has been consulted on by Dr. Alvy Bimler, her oncologist.  She was also seen by Dr. 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  The transfer to [facility] was then delayed due to high census and so [facility] was consulted on 2/14 PM.  ER to ER transfer was recommended by the [facility] hospitalist, but was not pursued.  The recommendation was made to have the patient remain in the ER, since there is little to offer the patient through  admission to our system.  Vascular surgery was willing to remain available as consultants on the case.  Due to the patient having ongoing pain and concern for lack of in-hospital surgical and ICU teams, the patient was transferred from [facility] to [facility] ER overnight on 2/15.  All quaternary accepting facilities are full and unable to accept the patient for inpatient transfer at this time.  As such, the [facility] service was expected to admit the patient and she was  transferred upstairs prior to my evaluation of the patient this AM.   Review of Systems: As per HPI; otherwise review of systems reviewed and negative.   Ambulatory Status:  Ambulated without assistance a few weeks ago, but rapidly progressive symptoms particularly since Monday  Past Medical History:  Diagnosis Date  . Acute on chronic respiratory failure with hypoxia ([place]) 03/07/2018  . Anxiety and depression   . Aortic aneurysm ([place])    see ov note 03/2017   . Arthritis   . Aspiration pneumonia ([place])   . Carcinomatosis ([place]) 10/2017  . CHF (congestive heart failure) ([name])    diastolic dysfunction  . COPD (chronic obstructive pulmonary disease) ([place]) 10/23/2017  . Depression   . Family history of breast cancer   . Family history of prostate cancer   . Family history of uterine cancer   . History of diverticulitis   . HLD (hyperlipidemia) 10/23/2017  . Hypertension   . Mitral valve prolapse   . Pelvic abscess in female   . Sleep apnea    cpap machine mask and tubing in [place] per patient.  Patient does not have mask or tubing to bring on DOS of 01/27/2018.   Marland Kitchen Uterine cancer ([place])    had partial hysterectomy at 27    Past Surgical History:  Procedure Laterality Date  . ABDOMINAL HYSTERECTOMY    . cardiac monitor implant    . CEREBRAL ANEURYSM REPAIR    . CERVICAL SPINE SURGERY     bone spurs  . DEBULKING N/A 01/27/2018   Procedure: RADICAL DEBULKING;  Surgeon: [name], MD;  Location: [facility];  Service: Gynecology;  Laterality: N/A;  . IR CATHETER TUBE CHANGE  06/13/2018  . IR FLUORO GUIDE PORT INSERTION RIGHT  10/14/2017  . IR PARACENTESIS  10/14/2017  . IR RADIOLOGIST EVAL & MGMT  04/26/2018  . IR RADIOLOGIST EVAL & MGMT  05/10/2018  . IR RADIOLOGIST EVAL & MGMT  05/25/2018  . IR RADIOLOGIST EVAL & MGMT  07/20/2018  . IR REMOVAL TUN ACCESS W/ PORT W/O FL MOD SED  07/02/2018  . IR US GUIDE VASC ACCESS RIGHT  10/14/2017  . LAPAROTOMY N/A 01/27/2018   Procedure: EXPLORATORY  LAPAROTOMY WITH REMOVAL OF BILATERAL TUBES AND OVARIES;  Surgeon: [name], MD;  Location: [facility];  Service: Gynecology;  Laterality: N/A;  . LAPAROTOMY N/A 01/31/2018   Procedure: EXPLORATORY LAPAROTOMY, ABDOMINAL WASHOUT;  Surgeon: Isabel Caprice, MD;  Location: [facility];  Service: Gynecology;  Laterality: N/A;  . OMENTECTOMY N/A 01/27/2018   Procedure: OMENTECTOMY;  Surgeon: [name], MD;  Location: [facility];  Service: Gynecology;  Laterality: N/A;  . PARTIAL HYSTERECTOMY  1971   "precancer"  . TEE WITHOUT CARDIOVERSION N/A 07/04/2018   Procedure: TRANSESOPHAGEAL ECHOCARDIOGRAM (TEE);  Surgeon: Sanda Klein, MD;  Location: [facility] ENDOSCOPY;  Service: Cardiovascular;  Laterality: N/A;    Social History   Socioeconomic History  . Marital status: Married    Spouse name: Not on file  . Number of children:  3  . Years of education: Not on file  . Highest education level: Not on file  Occupational History  . Occupation: retired  Scientific laboratory technician  . Financial resource strain: Not on file  . Food insecurity:    Worry: Not on file    Inability: Not on file  . Transportation needs:    Medical: Not on file    Non-medical: Not on file  Tobacco Use  . Smoking status: Former Smoker    Packs/day: 1.00    Years: 55.00    Pack years: 55.00    Last attempt to quit: 10/06/2017    Years since quitting: 0.7  . Smokeless tobacco: Never Used  Substance and Sexual Activity  . Alcohol use: Never    Frequency: Never  . Drug use: Never  . Sexual activity: Not Currently  Lifestyle  . Physical activity:    Days per week: Not on file    Minutes per session: Not on file  . Stress: Not on file  Relationships  . Social connections:    Talks on phone: Not on file    Gets together: Not on file    Attends religious service: Not on file    Active member of club or organization: Not on file    Attends meetings of clubs or organizations: Not on file    Relationship status: Not on file  . Intimate  partner violence:    Fear of current or ex partner: Not on file    Emotionally abused: Not on file    Physically abused: Not on file    Forced sexual activity: Not on file  Other Topics Concern  . Not on file  Social History Narrative  . Not on file    Allergies  Allergen Reactions  . Oxycodone Other (See Comments)    confusion    Family History  Problem Relation Age of Onset  . Endometrial cancer Sister 40       endometrial ca  . Endometrial cancer Paternal Aunt 80  . Breast cancer Paternal Aunt   . Stomach cancer Maternal Grandfather        d. 84s  . Prostate cancer Paternal Uncle   . Breast cancer Cousin        pat first cousin  . Breast cancer Cousin        pat first cousin's daughter  . Lung cancer Father        lung ca  . Heart disease Mother   . Stroke Paternal Grandmother   . Endometrial cancer Cousin        paternal first cousin's daughter  . Breast cancer Other        niece dx less than 34s  . Breast cancer Other        niece, dx under 3    Prior to Admission medications   Medication Sig Start Date End Date Taking? Authorizing Provider  acetaminophen (TYLENOL) 325 MG tablet Take 2 tablets (650 mg total) by mouth every 6 (six) hours as needed for mild pain, fever or headache. 03/04/18  Yes Cross, Melissa D, NP  carvedilol (COREG) 12.5 MG tablet Take 12.5 mg by mouth 2 (two) times daily with a meal.   Yes [provider]  magnesium oxide (MAG-OX) 400 MG tablet Take 400 mg by mouth daily.   Yes [provider]  mirtazapine (REMERON) 30 MG tablet Take 1 tablet (30 mg total) by mouth at bedtime. 05/13/18  Yes [name], MD  Multiple  Vitamin (MULTIVITAMIN WITH MINERALS) TABS tablet Take 1 tablet by mouth daily.   Yes [provider]  HYDROcodone-acetaminophen (NORCO/VICODIN) 5-325 MG tablet Take 1-2 tablets by mouth every 6 (six) hours as needed for severe pain. Patient not taking: Reported on 07/21/2018 07/07/18   Donne Hazel, MD    polyethylene glycol [facility] Floria Raveling) packet Take 17 g by mouth daily. Patient not taking: Reported on 07/21/2018 07/08/18   Donne Hazel, MD  senna-docusate (SENOKOT-S) 8.6-50 MG tablet Take 1 tablet by mouth at bedtime for 30 days. Patient not taking: Reported on 07/21/2018 07/07/18 08/06/18  Donne Hazel, MD    Physical Exam: Vitals:   07/23/18 0700 07/23/18 0800 07/23/18 0857 07/23/18 0903  BP: (!) 171/83 (!) 180/91  (!) 152/90  Pulse: 87  86 86  Resp: (!) 21  (!) 25 19  Temp:  99.1 F (37.3 C)    TempSrc:      SpO2: 96% 96% 100% 99%  Weight:  44.8 kg    Height:  5\' 3"  (1.6 m)       . General:  Appears chronically ill and uncomfortable but stoic . Eyes:   EOMI, normal lids, iris . ENT:  grossly normal hearing, lips & tongue, mmm . Neck:  no LAD, masses or thyromegaly . Cardiovascular:  RRR, no m/r/g. No LE edema.  Marland Kitchen Respiratory:   CTA bilaterally with no wheezes/rales/rhonchi.  Normal to mildly increased respiratory effort. . Abdomen:  soft, NT, ND, NABS; I do not appreciate a pulsating mass in her abdomen.  Her pain appears to be located along her right lateral ribcage.  Left pelvic drainage catheter in place. . Skin:  no rash or induration seen on limited exam . Musculoskeletal:  grossly normal tone BUE/BLE, good ROM, no bony abnormality . Psychiatric:  flat mood and affect, speech fluent and appropriate, AOx3 . Neurologic:  CN 2-12 grossly intact, moves all extremities in coordinated fashion, sensation intact    Radiological Exams on Admission: Dg Chest 2 View  Result Date: 07/21/2018 CLINICAL DATA:  Lethargy and weakness. EXAM: CHEST - 2 VIEW COMPARISON:  Chest x-ray 06/30/2018 FINDINGS: The heart is borderline enlarged but stable. A loop recorder is noted. Left-sided PICC line is in good position with tip in the distal SVC near the cavoatrial junction. The right-sided power port has been removed since the prior chest film. No acute pulmonary findings. No edema or  effusions. No worrisome pulmonary lesions. The bony thorax is intact. IMPRESSION: 1. No acute cardiopulmonary findings. 2. Left PICC line tip in good position. Electronically Signed   By: Marijo Sanes M.D.   On: 07/21/2018 14:15   Ct Head Wo Contrast  Result Date: 07/21/2018 CLINICAL DATA:  Altered level of consciousness. EXAM: CT HEAD WITHOUT CONTRAST TECHNIQUE: Contiguous axial images were obtained from the base of the skull through the vertex without intravenous contrast. COMPARISON:  CT scan of June 30, 2018. FINDINGS: Brain: Mild chronic ischemic white matter disease is noted. No mass effect or midline shift is noted. Ventricular size is within normal limits. There is no evidence of mass lesion, hemorrhage or acute infarction. Vascular: No hyperdense vessel or unexpected calcification. Skull: Left frontal craniotomy is again noted. No acute abnormality is noted in the skull. Sinuses/Orbits: No acute finding. Other: Stable aneurysm clip is noted in the region of the left MCA. IMPRESSION: Mild chronic ischemic white matter disease. Stable aneurysm clip in left MCA territory. No acute intracranial abnormality seen. Electronically Signed  By: Marijo Conception, M.D.   On: 07/21/2018 20:04   Ct Abdomen Pelvis W Contrast  Result Date: 07/21/2018 CLINICAL DATA:  Generalized abdominal pain. Prior pelvic abscess with drain remaining in place. Decreased drainage. EXAM: CT ABDOMEN AND PELVIS WITH CONTRAST TECHNIQUE: Multidetector CT imaging of the abdomen and pelvis was performed using the standard protocol following bolus administration of intravenous contrast. CONTRAST:  14mL OMNIPAQUE IOHEXOL 300 MG/ML SOLN, 45mL OMNIPAQUE IOHEXOL 300 MG/ML SOLN COMPARISON:  06/30/2018 FINDINGS: Lower chest: Small right pleural effusion, stable. Bilateral lower lobe opacities, likely atelectasis. Hepatobiliary: No focal hepatic abnormality. Gallbladder unremarkable. Pancreas: No focal abnormality or ductal dilatation.  Spleen: No focal abnormality.  Normal size. Adrenals/Urinary Tract: Small cyst in the lower pole of the left kidney, stable. No renal or adrenal mass otherwise. No hydronephrosis. Urinary bladder unremarkable. Stomach/Bowel: Extensive sigmoid diverticulosis and wall thickening. No visible active diverticulitis. Large stool burden in the colon. No evidence of bowel obstruction. 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 This is best visualized and measured on coronal image 50. This occurs at the aortic hiatus. This contains contrast material and moderate thrombus. Along the left side of the upper abdominal aorta is a stable saccular aneurysm which measures 3.7 cm in craniocaudal length on coronal image 47. Heavily calcified aorta and iliac vessels. No adenopathy. Reproductive: Prior hysterectomy.  No adnexal masses. Other: Left pelvic drainage catheter remains in place, unchanged. No visible residual fluid collection. No free fluid or free air. Left midline ventral hernia noted in the infraumbilical region containing small bowel loop. No evidence of obstruction. Musculoskeletal: No acute bony abnormality. 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 This has rapidly developed since recent study. Recommend vascular surgical consultation. Sigmoid diverticulosis. Pelvic drainage catheter remains in the left side of the pelvis along the pelvic sidewall. No visible residual fluid collection. Large stool burden in the colon. Small right pleural effusion.  Bibasilar atelectasis. These results were called by telephone at the time of interpretation on 07/21/2018 at 8:10 pm to Dr. Marda Stalker , who verbally acknowledged  these results. Electronically Signed   By: Rolm Baptise M.D.   On: 07/21/2018 20:13    EKG: Independently reviewed from 2/13.  NSR with rate 99; LVH, LAFB, nonspecific ST changes with no evidence of acute ischemia   Labs on Admission: I have personally reviewed the available labs and imaging studies at the time of the admission.  Pertinent labs:   WBC 6.2 Hgb 8.2; 8.4 on 2/14; 9.7 on 2/13; 10.3 on 2/11; 11.0 on 1/29 Glucose 136 BUN 27/Creatinine 1.38/GFR 44; 12/0.8/>60 on 2/11 Lactate 1.5 INR 1.24   Assessment/Plan Principal Problem:   Mycotic aneurysm ([name]) Active Problems:   Primary peritoneal carcinomatosis ([name])   Iron deficiency anemia   COPD (chronic obstructive pulmonary disease) ([name])   Essential hypertension   AKI (acute kidney injury) ([place])   Mycotic aneurysm -Patient with recent admission for MSSA bacteremia (1/23-30) without evidence of endocarditis on TEE; she was discharged on Ancef for 2 weeks total.   -Repeat blood cultures from 1/26 were negative, as were cultures obtained at the time of presentation on 2/13. -Unfortunately, she had severe R chest/abdominal pain and was  found to have a new, large saccular aneurysm that is presumed to be mycotic in nature. -Vascular surgery was consulted and based on that discussion and on recommendation from an out-of-state neurologist relative, family requested transfer to an academic medical center.  The note from Dr. Carlis Abbott was quite comprehensive and demonstrates a significant effort on his part to educate the patient and family about her condition and her potential options for treatment. -I have spoken with [facility] vascular surgery -Make her ICU status and he will accept to TICU as a transfer to [facility] today, Dr. Sammuel Hines as the accepting physician -I had consulted Dr. Oneida Alar to again see the patient, but given that I have arranged for transfer urgently and that it is anticipated to happen today, this consult will be cancelled -I will  notify the patient and family of the plan for imminent transfer -Will continue broad-spectrum antibiotics with Vanc and Zosyn -ICU attending is Dr. 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 Trula Slade discussed this with the son on 1/6 and suggested that she will likely require visceral reconstruction due to its close proximity to the celiac artery - which may be able to be accommodated here or may require evaluation at [facility] -Because of the size of the aneurysm, she was to f/u in 1 year for CTA -This appears to be stable on current imaging -Due to the presumed mycotic nature of the new aneurysm, it seems unlikely that the original aneurysm repair would be attempted at the time of any intervention currently being considered  HTN -Continue Coreg  COPD -Appears to be stable at this time  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 Marcello Moores has recommended a flex sig to visualize the colonic mucosa and evaluate for colonic stricture, with plan to slowly withdraw the drain over several weeks to allow the fistula to heal behind it if there is not an ongoing stricture or diverticulitis. -If there is ongoing issue seen on flex sig, she opined that the patient will likely need a sigmoidectomy.   DVT prophylaxis:  SCDs Code Status:  Full - confirmed with patient/family Family Communication: Son and  additional family member were present throughout hospitalization  Disposition Plan: Transfer to [employer] called: Vascular; ID; Oncology Admission status: Admit - It is my clinical opinion that admission to INPATIENT is reasonable and necessary because of the expectation that this patient will require hospital care that crosses at least 2 midnights to treat this condition based on the medical complexity of the problems presented.  Given the aforementioned information, the predictability of an adverse outcome is felt to be significant.   Total critical care time: 95 minutes Critical care time was exclusive of separately billable procedures and treating other patients. Critical care was necessary to treat or prevent imminent or life-threatening deterioration. Critical care was time spent personally by me on the following activities: development of treatment plan with patient and/or surrogate as well as  nursing, discussions with consultants, evaluation of patient's response to treatment, examination of patient, obtaining history from patient or surrogate, ordering and performing treatments and interventions, ordering and review of laboratory studies, ordering and review of radiographic studies, pulse oximetry and re-evaluation of patient's condition.   Karmen Bongo MD [facility]   How to contact the [facility] Attending or Consulting provider [place] or covering provider during after hours [place], for this patient?  1. Check the care team in [facility] and look for a) attending/consulting [facility] provider listed and b) the [facility] team listed 2. Log into [email or web address] and use [place]'s universal password to access. If you do not have the password, please contact the hospital operator. 3. Locate the [facility] provider you are looking for under [facility] and page to a number that you can be directly reached. 4. If you still have difficulty reaching the provider, please page the [facility] (Director on Call)  for the Hospitalists listed on amion for assistance.   07/23/2018, 9:33 AM

## 2018-07-23 NOTE — ED Notes (Signed)
Attempted report 

## 2018-07-23 NOTE — Progress Notes (Signed)
07/23/2013 Report was given to [place] at 12:05 at [facility]. Patient will be admitted to Rm [id number]. Patient Left [facility] at 12:29 via Care link to [facility]  At 12:29 with Lactate Ringer at 100ccc/hr. Wynetta Fines.

## 2018-07-23 NOTE — ED Notes (Signed)
Received call from [facility]. Stated that patient is still on the wait list, but it will be "several days" before a bed will become available.  Dr Betsey Holiday currently speaking with [facility] again about admitting the patient until a bed becomes available at [facility].

## 2018-07-23 NOTE — ED Provider Notes (Signed)
  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  Discussed with vascular surgery, who is concerned for aortitis, which would require extensive thoracoabdominal surgery.  PICC line and second peripheral IV in place, patient states she is feeling better, vital signs normal, awaiting further vascular surgery recommendations.  Results discussed with patient and family.   [MB]    Clinical Course User Index [MB] Maudie Flakes, MD    Procedures  MDM   Patient with symptomatic AAA and hemoglobin of 8.4 is awaiting transfer to outside facility for second opinion.  Patient had come in with 1 week of abdominal pain and found to have rapidly advancing saccular aneurysm in her abdominal aorta.  Vascular surgery has seen the patient, they had offered her intervention, however family wants second opinion at [place].  Unfortunately there are no beds available at [facility] and therefore they have refused transfer at this time.  We received a call this evening, more than 24 hours into patient's stay in the ER that [facility] might not have a bed available for 2 more days.  ED staff has spoken with vascular surgery, since patient does not want any intervention at this time from their service, they will be strictly acting as consultants.  We had called medicine service to see if they will admit the patient with vascular on board and transfer patient to [facility], but they have declined admission as they will  not be adding any value with admission.  We have also spoken with the administrator on-call and ICU team -but no disposition has been reached.  On my reassessment patient states that she still having 8 out of 10 pain.  Abdominal exam is not peritoneal.  I do not think that [facility] emergency room is the best place for patient to wait for the next several hours, especially since there is no in-house surgical service, ICU service.  I have discussed the case with Dr. Venora Maples, EDP at [facility].  He will be excepting.  I have also explained the circumstances to charge nurse.  Patient is agreeable to the transfer.    Varney Biles, MD 07/23/18 [phone]

## 2018-07-23 NOTE — Discharge Summary (Addendum)
Discharge Summary  Jo Parker JJO:[id number] DOB: 04-01-1946  PCP: [name], MD  Admit date: 07/21/2018 Discharge date: 07/23/2018   Time spent: 45 minutes  Admitted From: Home Disposition:  Transfer to [facility]  Recommendations for Outpatient Follow-up:  1. Follow up with PCP as directed by [facility]    Discharge Diagnoses:  Active Hospital Problems   Diagnosis Date Noted  . 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 AKI (acute kidney injury) ([place]) 01/29/2018  . Essential hypertension 12/20/2017  . COPD (chronic obstructive pulmonary disease) ([place]) 10/23/2017  . Iron deficiency anemia 10/08/2017  . Primary peritoneal carcinomatosis ([place]) 10/05/2017    Resolved Hospital Problems  No resolved problems to display.    Discharge Condition:  Critical  CODE STATUS: FULL Diet recommendation:  NPO as of now in anticipation of possible surgery  Vitals:   07/23/18 0857 07/23/18 0903  BP:  (!) 152/90  Pulse: 86 86  Resp: (!) 25 19  Temp:    SpO2: 100% 99%    History of present illness:  Jo Parker is a 73 y.o. 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  Vascular surgery was consulted and reported concern for mycotic aneurysm ("highly suspicious for infectious process").  Options presented to the family were: 1- IV antibiotics and conservative measures with engagement with palliative care since this is likely to result in a terminal rupture; 2- TEVAR via transfemoral assess as a temporizing measure to cover the distal thoracic and supraceliac aorta with suppressive lifelong antibiotics.  Dr. Carlis Abbott  did not believe that the patient was a candidate for an open repair.  Based on this discussion, which occurred late in the evening on 2/13, the family requested transfer to an academic medical center and was placed on a list for transfer to either [facility] or [facility].  Per notes from ER physicians subsequently, the family is aware of the risk of death associated with watchful waiting and continues to decline intervention here at [facility] while awaiting transfer to [facility].  She remains on broad-spectrum antibiotics.  She has been consulted on by Dr. Alvy Bimler, her oncologist.  She was also seen by Dr. 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  The transfer to [name] was then delayed due to high census and so [facility] was consulted on 2/14 PM.  ER to ER transfer was recommended by the [facility] hospitalist, but was not pursued.  The recommendation was made to have the patient remain in the ER, since there is little to offer the patient through admission to our system.  Vascular surgery was willing to remain available as consultants on the case.  Due to the patient having ongoing pain and concern for lack of in-hospital surgical and ICU teams, the patient was transferred from [facility] to [facility] ER overnight on 2/15.  All quaternary accepting facilities are full and unable to accept the patient for inpatient transfer at this time.  As such, the [facility] service was expected to admit the patient and she was transferred upstairs this  AM.  Hospital Course:   The patient was briefly admitted to SDU at [facility].  She was continued on Vanc and Zosyn, as per recommendation from infectious disease, and was given Fentanyl for pain control. Due to concern for the severity of her condition, Vascular Surgery at [facility] was consulted.  Based on her downtrending Hgb and concern for slow leakage from this unstable  mycotic aneurysm, the vascular fellow requested that I transfer the patient to ICU status in anticipation of transfer to [place] this AM; there is a bed currently available.  Dr. Sammuel Hines is the accepting physician.  I then spoke with Dr. Coy Saunas, who agrees to the transfer and who will add the patient to his list.  Due to time constraints associated with this urgent transfer, he may not have the opportunity to see the patient prior to her transfer.  However, she is currently hemodynamically stable.  Today's blood work will be drawn prior to transfer so that it will be available immediately upon her arrival at [facility].  She is now NPO in case there is the need for surgery today, depending on the recommendations from vascular surgery at the time of her arrival.   Consultations:  Vascular surgery  ID  Oncology  Procedures/Studies:  See imaging results below  Discharge Exam: BP (!) 152/90   Pulse 86   Temp 99.1 F (37.3 C)   Resp 19   Ht 5\' 3"  (1.6 m)   Wt 44.8 kg   SpO2 99%   BMI 17.50 kg/m    General:  Appears chronically ill and uncomfortable but stoic  Eyes:   EOMI, normal lids, iris  ENT:  grossly normal hearing, lips & tongue, mmm  Neck:  no LAD, masses or thyromegaly  Cardiovascular:  RRR, no m/r/g. No LE edema.   Respiratory:   CTA bilaterally with no wheezes/rales/rhonchi.  Normal to mildly increased respiratory effort.  Abdomen:  soft, NT, ND, NABS; I do not appreciate a pulsating mass in her abdomen.  Her pain appears to be located along her right lateral ribcage.  Left pelvic drainage catheter in place.  Skin:  no rash or induration seen on limited exam  Musculoskeletal:  grossly normal tone BUE/BLE, good ROM, no bony abnormality  Psychiatric:  flat mood and affect, speech fluent and appropriate, AOx3  Neurologic:  CN 2-12 grossly intact, moves all extremities in coordinated fashion, sensation intact   Discharge Instructions Follow up: Please make an  appointment to see your primary physician for follow up within 7 days of hospital discharge.  At that appointment:  -We routinely change or add medications that can affect your baseline labs and fluid status; therefore, you may require repeat blood work or tests during your next visit with your PCP.  Your PCP may decide not to get them or may add new tests based on their clinical decision.  -Please get all medicines reviewed and adjusted.  -Please request that your primary physician go over all hospital tests and procedure/radiological results at the follow up.  Please get all hospital records sent to your physician by signing a hospital release before you go home.  Activity: best rest for now  Disposition: Transfer to [facility] TICU  Diet:   NPO for now   Special Instructions: If you have smoked or chewed Tobacco  in the last 2 yrs please stop smoking; also stop any regular Alcohol and/or any Recreational drug use including marijuana.  Wear Seat belts while driving.     Allergies  as of 07/23/2018      Reactions   Oxycodone Other (See Comments)   confusion          Allergies  Allergen Reactions  . Oxycodone Other (See Comments)    confusion      The results of significant diagnostics from this hospitalization (including imaging, microbiology, ancillary and laboratory) are listed below for reference.    Significant Diagnostic Studies: Dg Chest 2 View  Result Date: 07/21/2018 CLINICAL DATA:  Lethargy and weakness. EXAM: CHEST - 2 VIEW COMPARISON:  Chest x-ray 06/30/2018 FINDINGS: The heart is borderline enlarged but stable. A loop recorder is noted. Left-sided PICC line is in good position with tip in the distal SVC near the cavoatrial junction. The right-sided power port has been removed since the prior chest film. No acute pulmonary findings. No edema or effusions. No worrisome pulmonary lesions. The bony thorax is intact. IMPRESSION: 1. No acute cardiopulmonary findings. 2. Left  PICC line tip in good position. Electronically Signed   By: Marijo Sanes M.D.   On: 07/21/2018 14:15   Dg Chest 2 View  Result Date: 06/30/2018 CLINICAL DATA:  Fever.  History of uterine cancer. EXAM: CHEST - 2 VIEW COMPARISON:  March 12, 2018 FINDINGS: The right Port-A-Cath is stable. No pneumothorax. Possible mild pulmonary venous congestion. No overt edema or focal infiltrate. The cardiomediastinal silhouette is unremarkable. No pneumothorax. Probable tiny pleural effusions. IMPRESSION: Tiny pleural effusions and possible mild pulmonary venous congestion. No overt edema or focal infiltrate. Electronically Signed   By: Dorise Bullion III M.D   On: 06/30/2018 15:58   Ct Head Wo Contrast  Result Date: 07/21/2018 CLINICAL DATA:  Altered level of consciousness. EXAM: CT HEAD WITHOUT CONTRAST TECHNIQUE: Contiguous axial images were obtained from the base of the skull through the vertex without intravenous contrast. COMPARISON:  CT scan of June 30, 2018. FINDINGS: Brain: Mild chronic ischemic white matter disease is noted. No mass effect or midline shift is noted. Ventricular size is within normal limits. There is no evidence of mass lesion, hemorrhage or acute infarction. Vascular: No hyperdense vessel or unexpected calcification. Skull: Left frontal craniotomy is again noted. No acute abnormality is noted in the skull. Sinuses/Orbits: No acute finding. Other: Stable aneurysm clip is noted in the region of the left MCA. IMPRESSION: Mild chronic ischemic white matter disease. Stable aneurysm clip in left MCA territory. No acute intracranial abnormality seen. Electronically Signed   By: Marijo Conception, M.D.   On: 07/21/2018 20:04   Ct Head Wo Contrast  Result Date: 06/30/2018 CLINICAL DATA:  73 year old female with headache and confusion following fall and head injury 1 week ago. EXAM: CT HEAD WITHOUT CONTRAST TECHNIQUE: Contiguous axial images were obtained from the base of the skull through the  vertex without intravenous contrast. COMPARISON:  03/03/2018 and prior CTs FINDINGS: Brain: No evidence of acute infarction, hemorrhage, hydrocephalus, extra-axial collection or mass lesion/mass effect. Atrophy, chronic small-vessel white matter ischemic changes and LEFT aneurysm clip again noted. Vascular: Carotid atherosclerotic calcifications again noted. Skull: No acute abnormality. LEFT temporoparietal craniotomy/craniectomy noted. Sinuses/Orbits: No acute abnormality Other: None IMPRESSION: 1. No evidence of acute intracranial abnormality 2. Atrophy and chronic small-vessel white matter ischemic changes. LEFT-sided aneurysm clip again noted. Electronically Signed   By: Margarette Canada M.D.   On: 06/30/2018 19:25   Ct Abdomen Pelvis W Contrast  Result Date: 07/21/2018 CLINICAL DATA:  Generalized abdominal pain. Prior pelvic abscess with drain remaining in place. Decreased drainage. EXAM: CT ABDOMEN  AND PELVIS WITH CONTRAST TECHNIQUE: Multidetector CT imaging of the abdomen and pelvis was performed using the standard protocol following bolus administration of intravenous contrast. CONTRAST:  39mL OMNIPAQUE IOHEXOL 300 MG/ML SOLN, 25mL OMNIPAQUE IOHEXOL 300 MG/ML SOLN COMPARISON:  06/30/2018 FINDINGS: Lower chest: Small right pleural effusion, stable. Bilateral lower lobe opacities, likely atelectasis. Hepatobiliary: No focal hepatic abnormality. Gallbladder unremarkable. Pancreas: No focal abnormality or ductal dilatation. Spleen: No focal abnormality.  Normal size. Adrenals/Urinary Tract: Small cyst in the lower pole of the left kidney, stable. No renal or adrenal mass otherwise. No hydronephrosis. Urinary bladder unremarkable. Stomach/Bowel: Extensive sigmoid diverticulosis and wall thickening. No visible active diverticulitis. Large stool burden in the colon. No evidence of bowel obstruction. Vascular/Lymphatic: There is a new large saccular aneurysm noted off the right side of the distal thoracic aorta  measuring 5.3 cm maximally. This is best visualized and measured on coronal image 50. This occurs at the aortic hiatus. This contains contrast material and moderate thrombus. Along the left side of the upper abdominal aorta is a stable saccular aneurysm which measures 3.7 cm in craniocaudal length on coronal image 47. Heavily calcified aorta and iliac vessels. No adenopathy. Reproductive: Prior hysterectomy.  No adnexal masses. Other: Left pelvic drainage catheter remains in place, unchanged. No visible residual fluid collection. No free fluid or free air. Left midline ventral hernia noted in the infraumbilical region containing small bowel loop. No evidence of obstruction. Musculoskeletal: No acute bony abnormality. 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 This has rapidly developed since recent study. Recommend vascular surgical consultation. Sigmoid diverticulosis. Pelvic drainage catheter remains in the left side of the pelvis along the pelvic sidewall. No visible residual fluid collection. Large stool burden in the colon. Small right pleural effusion.  Bibasilar atelectasis. These results were called by telephone at the time of interpretation on 07/21/2018 at 8:10 pm to Dr. Marda Stalker , who verbally acknowledged these results. Electronically Signed   By: Rolm Baptise M.D.   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 Electronically Signed   By: Margarette Canada M.D.   On: 07/01/2018 10:19   Result Date: 07/01/2018 CLINICAL DATA:  73 year old female with abdominal and pelvic pain, fever and nausea. Recent LEFT pelvic  abscess and percutaneous drain. EXAM: CT ABDOMEN AND PELVIS WITH CONTRAST TECHNIQUE: Multidetector CT imaging of the abdomen and pelvis was performed using the standard protocol following bolus administration of intravenous contrast. CONTRAST:  87mL ISOVUE-300 IOPAMIDOL (ISOVUE-300) INJECTION 61% COMPARISON:  06/13/2018 percutaneous drain replacement, 05/10/2018 CT and prior studies FINDINGS: Lower chest: Mild cardiomegaly and new small RIGHT pleural effusion noted. Hepatobiliary: The liver and gallbladder are unremarkable. No biliary dilatation. Pancreas: Unremarkable Spleen: Unremarkable Adrenals/Urinary Tract: The kidneys, adrenal glands and bladder are unremarkable except for a LEFT renal cyst. Stomach/Bowel: There is no evidence of bowel obstruction or definite bowel wall thickening. Colonic diverticulosis noted without definite evidence of diverticulitis. 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 Aortic atherosclerotic calcifications are again noted. Reproductive: Status post hysterectomy. No adnexal masses. Other: Retroperitoneal and mesenteric edema identified. No evidence of ascites  or pneumoperitoneum. A percutaneous drainage catheter within the LEFT pelvis is unchanged without residual abscess. Musculoskeletal: No acute or suspicious bony abnormalities identified. IMPRESSION: 1. New small RIGHT pleural effusion and nonspecific retroperitoneal/mesenteric edema. 2. LEFT pelvic drainage catheter again noted - no evidence of residual abscess. 3. 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 4. Cardiomegaly. 5.  Aortic Atherosclerosis (ICD10-I70.0). Electronically Signed: By: Margarette Canada M.D. On: 06/30/2018 19:36   Ir Removal [employer] W/ [place] W/o Fl Mod Sed  Result Date: 07/02/2018 INDICATION: 73 year old female with bacteremia. She has been referred for port catheter removal. EXAM: REMOVAL RIGHT IJ VEIN PORT-A-CATH MEDICATIONS: 1 g Ancef;  The antibiotic was administered within an appropriate time interval prior to skin puncture. ANESTHESIA/SEDATION: Moderate (conscious) sedation was employed during this procedure. A total of Versed 1.0 mg and Fentanyl 50 mcg was administered intravenously. Moderate Sedation Time: 11 minutes. The patient's level of consciousness and vital signs were monitored continuously by radiology nursing throughout the procedure under my direct supervision. FLUOROSCOPY TIME:  None COMPLICATIONS: None PROCEDURE: Informed consent was obtained from the patient following an explanation of the procedure, risks, benefits and alternatives. The patient understands, agrees and consents for the procedure. All questions were addressed. A time out was performed prior to the initiation of the procedure. The patient was positioned in the operation suite in the supine position on a gantry. The previous scar on the right chest was generously infiltrated with 1% lidocaine for local anesthesia. Infiltration of the skin and subcutaneous tissues surrounding the port was performed. Using sharp and blunt dissection, the port apparatus and subcutaneous catheter were removed in their entirety. The port pocket was then closed with interrupted Vicryl layer and a running subcuticular with 4-0 Monocryl. The skin was sealed with Derma bond. A sterile dressing was placed. The patient tolerated the procedure well and remained hemodynamically stable throughout. No complications were encountered and no significant blood loss was encountered. IMPRESSION: Status post removal of right IJ port catheter. Signed, Dulcy Fanny. Dellia Nims, RPVI [facility] Electronically Signed   By: Corrie Mckusick D.O.   On: 07/02/2018 15:09   Dg Sinus/fist Tube Chk-non Gi  Result Date: 07/20/2018 CLINICAL DATA:  Left pelvic diverticular abscess, status post percutaneous drain EXAM: ABSCESS INJECTION CONTRAST:  10 cc Omnipaque  300 FLUOROSCOPY TIME:  Fluoroscopy Time:  42 seconds Radiation Exposure Index (if provided by the fluoroscopic device): 11 Number of Acquired Spot Images: 3 COMPARISON:  05/25/2018, 06/13/2018 FINDINGS: Fluoroscopic injection performed of the left pelvic sidewall abscess. Catheter has been repositioned further away from the sigmoid colon. With slow injection, there is a small remaining fistula track to the adjacent sigmoid colon. This does appear smaller when compared to 05/25/2018. No residual abscess cavity. 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 Electronically Signed   By: Jerilynn Mages.  Shick M.D.   On: 07/20/2018 10:45   [name] Ekg Site Rite  Result Date: 07/06/2018 If Site Rite image not attached, placement could not be confirmed due to current cardiac rhythm.  Ir Radiologist Eval & Mgmt  Result Date: 07/20/2018 Please refer to notes tab for details about interventional procedure. (Op Note)   Microbiology: Recent Results (from the past 240 hour(s))  Culture, blood (Routine x 2)     Status: None (Preliminary result)   Collection Time: 07/21/18  1:21 PM  Result Value Ref Range Status   Specimen Description  Final    BLOOD LEFT HAND Performed at [facility], [place] [address], [place], [place] [address]    Special Requests   Final    BOTTLES DRAWN AEROBIC AND ANAEROBIC Blood Culture results may not be optimal due to an inadequate volume of blood received in culture bottles Performed at [place] [address], [place], [place] [address]    Culture   Final    NO GROWTH 2 DAYS Performed at [place] [address] [place], [place] [address]    Report Status PENDING  Incomplete  Culture, blood (Routine x 2)     Status: None (Preliminary result)   Collection Time: 07/21/18  2:20 PM  Result Value Ref Range Status   Specimen Description BLOOD RIGHT ANTECUBITAL  Final    Special Requests   Final    BOTTLES DRAWN AEROBIC AND ANAEROBIC Blood Culture adequate volume Performed at [place] [address] [place], [place] [address]    Culture   Final    NO GROWTH 2 DAYS Performed at [place] [address], [place], [place] [address]    Report Status PENDING  Incomplete     Labs: Basic Metabolic Panel: Recent Labs  Lab 07/19/18 1157 07/19/18 1224 07/21/18 1324 07/22/18 1338  NA 140  --  132*  --   K 3.8  --  3.6  --   CL 102  --  94*  --   CO2 27  --  25  --   GLUCOSE 114*  --  136*  --   BUN 12  --  27*  --   CREATININE 0.80  --  1.38* 0.73  CALCIUM 9.5  --  9.3  --   MG  --  1.4*  --   --    Liver Function Tests: Recent Labs  Lab 07/19/18 1157 07/21/18 1324  AST 17 23  ALT <6 10  ALKPHOS 78 62  BILITOT 0.4 0.7  PROT 8.5* 8.5*  ALBUMIN 3.2* 3.4*   No results for input(s): LIPASE, AMYLASE in the last 168 hours. Recent Labs  Lab 07/21/18 1420  AMMONIA 11   CBC: Recent Labs  Lab 07/19/18 1157 07/21/18 1324 07/22/18 0430 07/23/18 0600  WBC 5.7 8.1 8.2 6.2  NEUTROABS 3.5 5.7  --   --   HGB 10.3* 9.7* 8.4* 8.2*  HCT 33.6* 31.5* 27.3* 26.6*  MCV 68.6* 69.1* 67.4* 67.9*  PLT 234 204 172 180   Cardiac Enzymes: No results for input(s): CKTOTAL, CKMB, CKMBINDEX, TROPONINI in the last 168 hours. BNP: BNP (last 3 results) Recent Labs    06/30/18 1705  BNP 126.9*    ProBNP (last 3 results) No results for input(s): PROBNP in the last 8760 hours.  CBG: No results for input(s): GLUCAP in the last 168 hours.     Signed:  Karmen Bongo, MD [facility] 07/23/2018, 10:49 AM

## 2018-07-23 NOTE — ED Notes (Signed)
73mcg Fentanyl wasted with Scientist, clinical (histocompatibility and immunogenetics). Unable to waste in [place]?

## 2018-07-23 NOTE — ED Provider Notes (Signed)
Patient transferred to this emergency department from [facility].  There was concerns about the lack of vascular backup at [facility].  Patient awaiting transfer to [facility] has been contacted, they feel that it will be several days before they have a bed available.  Both [facility] and [facility] have stated that their weights are longer and were unable to care for the patient.  I was contacted by Dr. Hulen Skains Chief of Staff, this morning.  He had been called by the hospital administrator on-call, who had been involved in the conversations at [facility] earlier tonight..  Dr. Hulen Skains informed me that he did not believe that the patient should stay in the ER any longer and that the patient should be admitted by the hospitalist service.  He asked that I also contact Dr. Oneida Alar, on-call for vascular surgery.  Dr. Oneida Alar has been contacted.  He informs me that the initial consultation note is on the chart and that the recommendations have not changed.  Patient should undergo surgical repair.  He is available if the patient changes their mind about surgery here and also aware of the patient's presence here at [facility] and available for emergency.  Case discussed with Dr. Marlowe Sax, on-call for hospitalist service.  The entire length the history of the patient has been discussed and she agrees to assume care of the patient.  Patient will be admitted to the stepdown unit.  She is stable at this time.   Orpah Greek, MD 07/23/18 [phone]

## 2018-07-23 NOTE — ED Notes (Signed)
ED TO INPATIENT HANDOFF REPORT  Name/Age/Gender Jo Parker 73 y.o. female  Code Status Code Status History    Date Active Date Inactive Code Status Order ID Comments User Context   06/30/2018 2202 07/07/2018 1637 Full Code [id number]  Shelbie Proctor, MD Inpatient   03/04/2018 1333 03/23/2018 1820 Full Code [id number]  Frankey Poot Inpatient   02/13/2018 1315 03/04/2018 1148 Full Code [id number]  Marshell Garfinkel, MD Inpatient   02/13/2018 0949 02/13/2018 1315 DNR [id number]  Marshell Garfinkel, MD Inpatient   01/27/2018 1244 02/13/2018 0949 Full Code [id number]  Lahoma Crocker, MD Inpatient   10/23/2017 0859 10/27/2017 1655 Full Code [id number]  Purohit, Konrad Dolores, MD ED    Advance Directive Documentation     Most Recent Value  Type of Advance Directive  Healthcare Power of Attorney  Pre-existing out of facility DNR order (yellow form or pink MOST form)  -  "MOST" Form in Place?  -      Home/SNF/Other Home  Chief Complaint Lethargic; Weakness  Level of Care/Admitting Diagnosis ED Disposition    ED Disposition Condition Comment   Transfer to Another Facility  The patient appears reasonably stabilized for transfer considering the current resources, flow, and capabilities available in the ED at this time, and I doubt any other [facility] requiring further screening and/or treatment in the ED prior to transfer is p resent.       Medical History Past Medical History:  Diagnosis Date  . Acute on chronic respiratory failure with hypoxia ([place]) 03/07/2018  . Anxiety and depression   . Aortic aneurysm ([place])    see ov note 03/2017   . Arthritis   . Aspiration pneumonia ([place])   . 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 CHF (congestive heart failure) ([name])    diastolic dysfunction  . COPD (chronic obstructive pulmonary disease) ([place]) 10/23/2017  . Depression   . Family history of breast cancer   . Family history of prostate cancer   . Family history of uterine cancer   . History of diverticulitis    . HLD (hyperlipidemia) 10/23/2017  . Hypercholesterolemia   . Hypertension   . Mitral valve prolapse   . Pelvic abscess in female   . Sleep apnea    cpap machine mask and tubing in [place] per patient.  Patient does not have mask or tubing to bring on DOS of 01/27/2018.   Marland Kitchen Uterine cancer ([place])    had partial hysterectomy at 27    Allergies Allergies  Allergen Reactions  . Oxycodone Other (See Comments)    confusion    IV Location/Drains/Wounds Patient Lines/Drains/Airways Status   Active Line/Drains/Airways    Name:   Placement date:   Placement time:   Site:   Days:   Peripheral IV 07/03/18 Left;Medial Forearm   07/03/18    0021    Forearm   20   Peripheral IV 07/21/18 Right Wrist   07/21/18    1323    Wrist   2   Peripheral IV 07/21/18 Right Forearm   07/21/18    2128    Forearm   2   PICC Single Lumen 68/03/21 PICC Left Basilic 40 cm 0 cm   22/48/25    0037    Basilic   17   Closed System Drain 1 Left;Anterior Abdomen Bulb (JP) 10.2 Fr.   06/13/18    1325    Abdomen   40   External Urinary Catheter   07/01/18    2130    -  22   Incision (Closed) 07/05/18 Chest Right;Upper   07/05/18    1035     18   Incision (Closed) 07/05/18 Abdomen Mid   07/05/18    1036     18          Labs/Imaging Results for orders placed or performed during the hospital encounter of 07/21/18 (from the past 48 hour(s))  Lactic acid, plasma     Status: None   Collection Time: 07/21/18  1:21 PM  Result Value Ref Range   Lactic Acid, Venous 1.5 0.5 - 1.9 mmol/L    Comment: Performed at [facility], [place] [address] [place], [place] [address]  Culture, blood (Routine x 2)     Status: None (Preliminary result)   Collection Time: 07/21/18  1:21 PM  Result Value Ref Range   Specimen Description      BLOOD LEFT HAND Performed at [facility] [address] [place], [place] [address]    Special Requests      BOTTLES DRAWN AEROBIC AND ANAEROBIC Blood Culture  results may not be optimal due to an inadequate volume of blood received in culture bottles Performed at [facility], [place] [address] [place], [place] [address]    Culture      NO GROWTH < 24 HOURS Performed at [facility] [address], [place], [place] [address]    Report Status PENDING   Comprehensive metabolic panel     Status: Abnormal   Collection Time: 07/21/18  1:24 PM  Result Value Ref Range   Sodium 132 (L) 135 - 145 mmol/L   Potassium 3.6 3.5 - 5.1 mmol/L   Chloride 94 (L) 98 - 111 mmol/L   CO2 25 22 - 32 mmol/L   Glucose, Bld 136 (H) 70 - 99 mg/dL   BUN 27 (H) 8 - 23 mg/dL   Creatinine, Ser 1.38 (H) 0.44 - 1.00 mg/dL   Calcium 9.3 8.9 - 10.3 mg/dL   Total Protein 8.5 (H) 6.5 - 8.1 g/dL   Albumin 3.4 (L) 3.5 - 5.0 g/dL   AST 23 15 - 41 U/L   ALT 10 0 - 44 U/L   Alkaline Phosphatase 62 38 - 126 U/L   Total Bilirubin 0.7 0.3 - 1.2 mg/dL   GFR calc non Af Amer 38 (L) >60 mL/min   GFR calc Af Amer 44 (L) >60 mL/min   Anion gap 13 5 - 15    Comment: Performed at [facility], [place] [address], [place], [place] [address]  CBC with Differential     Status: Abnormal   Collection Time: 07/21/18  1:24 PM  Result Value Ref Range   WBC 8.1 4.0 - 10.5 K/uL   RBC 4.56 3.87 - 5.11 MIL/uL   Hemoglobin 9.7 (L) 12.0 - 15.0 g/dL   HCT 31.5 (L) 36.0 - 46.0 %   MCV 69.1 (L) 80.0 - 100.0 fL   MCH 21.3 (L) 26.0 - 34.0 pg   MCHC 30.8 30.0 - 36.0 g/dL   RDW 20.4 (H) 11.5 - 15.5 %   Platelets 204 150 - 400 K/uL   nRBC 0.0 0.0 - 0.2 %   Neutrophils Relative % 70 %   Neutro Abs 5.7 1.7 - 7.7 K/uL   Lymphocytes Relative 17 %   Lymphs Abs 1.4 0.7 - 4.0 K/uL   Monocytes Relative 12 %   Monocytes Absolute 1.0 0.1 - 1.0 K/uL   Eosinophils Relative  0 %   Eosinophils Absolute 0.0 0.0 - 0.5 K/uL   Basophils Relative 0 %   Basophils Absolute 0.0 0.0 - 0.1 K/uL   Immature Granulocytes 1 %   Abs Immature Granulocytes 0.05 0.00 - 0.07 K/uL     Comment: Performed at [facility], [place] [address] [place], [place] [address]  Protime-INR     Status: Abnormal   Collection Time: 07/21/18  1:24 PM  Result Value Ref Range   Prothrombin Time 15.5 (H) 11.4 - 15.2 seconds   INR 1.24     Comment: Performed at [facility], [place] [address] [place], [place] [address]  Culture, blood (Routine x 2)     Status: None (Preliminary result)   Collection Time: 07/21/18  2:20 PM  Result Value Ref Range   Specimen Description BLOOD RIGHT ANTECUBITAL    Special Requests      BOTTLES DRAWN AEROBIC AND ANAEROBIC Blood Culture adequate volume Performed at [facility] [address], [place], [place] [address]    Culture      NO GROWTH < 24 HOURS Performed at [facility] [address] [place], [place] [address]    Report Status PENDING   Ammonia     Status: None   Collection Time: 07/21/18  2:20 PM  Result Value Ref Range   Ammonia 11 9 - 35 umol/L    Comment: Performed at [facility], [place] [address] [place], [place] [address]  Type and screen [place]     Status: None   Collection Time: 07/22/18  1:22 AM  Result Value Ref Range   ABO/RH(D) B POS    Antibody Screen NEG    Sample Expiration      07/25/2018 Performed at [facility], [place] [address], [place], [place] [address]   CBC     Status: Abnormal   Collection Time: 07/22/18  4:30 AM  Result Value Ref Range   WBC 8.2 4.0 - 10.5 K/uL   RBC 4.05 3.87 - 5.11 MIL/uL   Hemoglobin 8.4 (L) 12.0 - 15.0 g/dL    Comment: Reticulocyte Hemoglobin testing may be clinically indicated, consider ordering this additional test [id number]    HCT 27.3 (L) 36.0 - 46.0 %   MCV 67.4 (L) 80.0 - 100.0 fL   MCH 20.7 (L) 26.0 - 34.0 pg   MCHC 30.8 30.0 - 36.0 g/dL   RDW 20.3 (H) 11.5 - 15.5 %   Platelets 172 150 - 400 K/uL    Comment: REPEATED TO VERIFY PLATELETS APPEAR  ADEQUATE    nRBC 0.0 0.0 - 0.2 %    Comment: Performed at [facility], [place] [address], [place], [place] [address]  Creatinine, serum     Status: None   Collection Time: 07/22/18  1:38 PM  Result Value Ref Range   Creatinine, Ser 0.73 0.44 - 1.00 mg/dL   GFR calc non Af Amer >60 >60 mL/min   GFR calc Af Amer >60 >60 mL/min    Comment: Performed at [facility], [place] [address], [place], [place] [address]   Dg Chest 2 View  Result Date: 07/21/2018 CLINICAL DATA:  Lethargy and weakness. EXAM: CHEST - 2 VIEW COMPARISON:  Chest x-ray 06/30/2018 FINDINGS: The heart is borderline enlarged but stable. A loop recorder is noted. Left-sided PICC line is in good position with tip in the distal SVC near the cavoatrial junction. The right-sided  power port has been removed since the prior chest film. No acute pulmonary findings. No edema or effusions. No worrisome pulmonary lesions. The bony thorax is intact. IMPRESSION: 1. No acute cardiopulmonary findings. 2. Left PICC line tip in good position. Electronically Signed   By: Marijo Sanes M.D.   On: 07/21/2018 14:15   Ct Head Wo Contrast  Result Date: 07/21/2018 CLINICAL DATA:  Altered level of consciousness. EXAM: CT HEAD WITHOUT CONTRAST TECHNIQUE: Contiguous axial images were obtained from the base of the skull through the vertex without intravenous contrast. COMPARISON:  CT scan of June 30, 2018. FINDINGS: Brain: Mild chronic ischemic white matter disease is noted. No mass effect or midline shift is noted. Ventricular size is within normal limits. There is no evidence of mass lesion, hemorrhage or acute infarction. Vascular: No hyperdense vessel or unexpected calcification. Skull: Left frontal craniotomy is again noted. No acute abnormality is noted in the skull. Sinuses/Orbits: No acute finding. Other: Stable aneurysm clip is noted in the region of the left MCA. IMPRESSION: Mild chronic ischemic white matter disease.  Stable aneurysm clip in left MCA territory. No acute intracranial abnormality seen. Electronically Signed   By: Marijo Conception, M.D.   On: 07/21/2018 20:04   Ct Abdomen Pelvis W Contrast  Result Date: 07/21/2018 CLINICAL DATA:  Generalized abdominal pain. Prior pelvic abscess with drain remaining in place. Decreased drainage. EXAM: CT ABDOMEN AND PELVIS WITH CONTRAST TECHNIQUE: Multidetector CT imaging of the abdomen and pelvis was performed using the standard protocol following bolus administration of intravenous contrast. CONTRAST:  11mL OMNIPAQUE IOHEXOL 300 MG/ML SOLN, 47mL OMNIPAQUE IOHEXOL 300 MG/ML SOLN COMPARISON:  06/30/2018 FINDINGS: Lower chest: Small right pleural effusion, stable. Bilateral lower lobe opacities, likely atelectasis. Hepatobiliary: No focal hepatic abnormality. Gallbladder unremarkable. Pancreas: No focal abnormality or ductal dilatation. Spleen: No focal abnormality.  Normal size. Adrenals/Urinary Tract: Small cyst in the lower pole of the left kidney, stable. No renal or adrenal mass otherwise. No hydronephrosis. Urinary bladder unremarkable. Stomach/Bowel: Extensive sigmoid diverticulosis and wall thickening. No visible active diverticulitis. Large stool burden in the colon. No evidence of bowel obstruction. 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 This is best visualized and measured on coronal image 50. This occurs at the aortic hiatus. This contains contrast material and moderate thrombus. Along the left side of the upper abdominal aorta is a stable saccular aneurysm which measures 3.7 cm in craniocaudal length on coronal image 47. Heavily calcified aorta and iliac vessels. No adenopathy. Reproductive: Prior hysterectomy.  No adnexal masses. Other: Left pelvic drainage catheter remains in place, unchanged. No visible residual fluid collection. No free fluid or free air. Left  midline ventral hernia noted in the infraumbilical region containing small bowel loop. No evidence of obstruction. Musculoskeletal: No acute bony abnormality. 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 This has rapidly developed since recent study. Recommend vascular surgical consultation. Sigmoid diverticulosis. Pelvic drainage catheter remains in the left side of the pelvis along the pelvic sidewall. No visible residual fluid collection. Large stool burden in the colon. Small right pleural effusion.  Bibasilar atelectasis. These results were called by telephone at the time of interpretation on 07/21/2018 at 8:10 pm to  Dr. Marda Stalker , who verbally acknowledged these results. Electronically Signed   By: Rolm Baptise M.D.   On: 07/21/2018 20:13   EKG Interpretation  Date/Time:  Thursday July 21 2018 14:21:36 EST Ventricular Rate:  99 PR Interval:    QRS Duration: 93 QT Interval:  353 QTC Calculation: 453 R Axis:   -76 Text Interpretation:  Sinus rhythm Probable left atrial enlargement Left anterior fascicular block Left ventricular hypertrophy Anterior infarct, old similar to prior 1/20 Confirmed by Aletta Edouard [phone]) on 07/22/2018 4:30:44 PM   Pending Labs Unresulted Labs (From admission, onward)    Start     Ordered   07/22/18 1314  Creatinine, serum  Daily,   R     07/22/18 1313   07/21/18 1403  Urine culture  ONCE - STAT,   STAT     07/21/18 1403   07/21/18 1321  Urinalysis, Routine w reflex microscopic  ONCE - STAT,   STAT     07/21/18 1320          Vitals/Pain Today's Vitals   07/22/18 2300 07/23/18 0046 07/23/18 0203 07/23/18 0209  BP: 127/69 (!) 147/89 128/65   Pulse:  100 99   Resp: (!) 24 (!) 23 (!) 25   Temp:      TempSrc:  Oral    SpO2:  98% 95%    Weight:      Height:      PainSc:    0-No pain    Isolation Precautions No active isolations  Medications Medications  piperacillin-tazobactam (ZOSYN) IVPB 3.375 g (3.375 g Intravenous New Bag/Given 07/23/18 0030)  carvedilol (COREG) tablet 12.5 mg (12.5 mg Oral Given 07/22/18 1935)  protein supplement (ENSURE MAX) liquid (11 oz Oral Refused 07/22/18 1833)  vancomycin (VANCOCIN) IVPB 750 mg/150 ml premix (has no administration in time range)  morphine 2 MG/ML injection 2 mg (2 mg Intravenous Given 07/23/18 0033)  lactated ringers infusion ( Intravenous New Bag/Given 07/23/18 0040)  sodium chloride flush (NS) 0.9 % injection 3 mL (3 mLs Intravenous Given by Other 07/21/18 1324)  sodium chloride 0.9 % bolus 500 mL (0 mLs Intravenous Stopped 07/21/18 1824)  iohexol (OMNIPAQUE) 300 MG/ML solution 30 mL (30 mLs Oral Contrast Given 07/21/18 1509)  iohexol (OMNIPAQUE) 300 MG/ML solution 75 mL (75 mLs Intravenous Contrast Given 07/21/18 1935)  fentaNYL (SUBLIMAZE) injection 25 mcg (25 mcg Intravenous Given 07/21/18 1824)  piperacillin-tazobactam (ZOSYN) IVPB 3.375 g (0 g Intravenous Stopped 07/21/18 2240)  sodium chloride 0.9 % bolus 500 mL (0 mLs Intravenous Stopped 07/21/18 2245)  vancomycin (VANCOCIN) IVPB 1000 mg/200 mL premix ( Intravenous Stopped 07/22/18 0208)  0.9 %  sodium chloride infusion ( Intravenous New Bag/Given 07/22/18 0209)  fentaNYL (SUBLIMAZE) injection 50 mcg (50 mcg Intravenous Given 07/22/18 0435)  fentaNYL (SUBLIMAZE) injection 50 mcg (50 mcg Intravenous Given 07/22/18 1011)  acetaminophen (TYLENOL) tablet 650 mg (650 mg Oral Given 07/22/18 1011)  fentaNYL (SUBLIMAZE) injection 50 mcg (50 mcg Intravenous Given 07/22/18 1554)    Mobility non-ambulatory

## 2018-07-25 MED ORDER — HEPARIN SODIUM (PORCINE) 5000 UNIT/ML IJ SOLN
5000.00 | INTRAMUSCULAR | Status: DC
Start: 2018-08-05 — End: 2018-07-25

## 2018-07-25 MED ORDER — FENTANYL CITRATE (PF) 50 MCG/ML IJ SOLN
25.00 | INTRAMUSCULAR | Status: DC
Start: ? — End: 2018-07-25

## 2018-07-25 MED ORDER — ASPIRIN 81 MG PO CHEW
81.00 | CHEWABLE_TABLET | ORAL | Status: DC
Start: 2018-07-26 — End: 2018-07-25

## 2018-07-25 MED ORDER — VANCOMYCIN HCL IN DEXTROSE 1-5 GM/200ML-% IV SOLN
1000.00 | INTRAVENOUS | Status: DC
Start: 2018-07-26 — End: 2018-07-25

## 2018-07-25 MED ORDER — SIMETHICONE 80 MG PO CHEW
80.00 | CHEWABLE_TABLET | ORAL | Status: DC
Start: 2018-07-25 — End: 2018-07-25

## 2018-07-26 ENCOUNTER — Telehealth: Payer: Self-pay | Admitting: Oncology

## 2018-07-26 LAB — CULTURE, BLOOD (ROUTINE X 2)
CULTURE: NO GROWTH
Culture: NO GROWTH
Special Requests: ADEQUATE

## 2018-07-26 MED ORDER — FUROSEMIDE 10 MG/ML IJ SOLN
40.00 | INTRAMUSCULAR | Status: DC
Start: 2018-07-26 — End: 2018-07-26

## 2018-07-26 MED ORDER — CLOPIDOGREL BISULFATE 75 MG PO TABS
75.00 | ORAL_TABLET | ORAL | Status: DC
Start: 2018-07-29 — End: 2018-07-26

## 2018-07-26 MED ORDER — ASPIRIN 81 MG PO CHEW
81.00 | CHEWABLE_TABLET | ORAL | Status: DC
Start: 2018-08-06 — End: 2018-07-26

## 2018-07-26 MED ORDER — CARVEDILOL 12.5 MG PO TABS
12.50 | ORAL_TABLET | ORAL | Status: DC
Start: 2018-07-27 — End: 2018-07-26

## 2018-07-26 MED ORDER — POLYETHYLENE GLYCOL 3350 17 G PO PACK
17.00 | PACK | ORAL | Status: DC
Start: 2018-08-05 — End: 2018-07-26

## 2018-07-26 MED ORDER — DOCUSATE SODIUM 100 MG PO CAPS
100.00 | ORAL_CAPSULE | ORAL | Status: DC
Start: 2018-08-05 — End: 2018-07-26

## 2018-07-26 MED ORDER — HYDRALAZINE HCL 20 MG/ML IJ SOLN
10.00 | INTRAMUSCULAR | Status: DC
Start: ? — End: 2018-07-26

## 2018-07-26 MED ORDER — TRAMADOL HCL 50 MG PO TABS
50.00 | ORAL_TABLET | ORAL | Status: DC
Start: ? — End: 2018-07-26

## 2018-07-26 MED ORDER — ONDANSETRON 4 MG PO TBDP
4.00 | ORAL_TABLET | ORAL | Status: DC
Start: ? — End: 2018-07-26

## 2018-07-26 NOTE — Telephone Encounter (Signed)
Jo Parker called with an update.  He said Jo Parker had surgery yesterday for the stent placement and is doing really well.  She is awake, alert and moving around.  He said she could possibly discharged on Friday.

## 2018-07-27 MED ORDER — HYDRALAZINE HCL 20 MG/ML IJ SOLN
10.00 | INTRAMUSCULAR | Status: DC
Start: ? — End: 2018-07-27

## 2018-07-27 MED ORDER — MAGNESIUM OXIDE 400 MG PO TABS
400.00 | ORAL_TABLET | ORAL | Status: DC
Start: 2018-08-05 — End: 2018-07-27

## 2018-07-27 MED ORDER — POTASSIUM CHLORIDE CRYS ER 10 MEQ PO TBCR
40.00 | EXTENDED_RELEASE_TABLET | ORAL | Status: DC
Start: 2018-07-27 — End: 2018-07-27

## 2018-07-27 MED ORDER — VANCOMYCIN HCL IN DEXTROSE 1-5 GM/200ML-% IV SOLN
1000.00 | INTRAVENOUS | Status: DC
Start: 2018-07-28 — End: 2018-07-27

## 2018-07-27 MED ORDER — MAGNESIUM SULFATE 2 GM/50ML IV SOLN
2.00 | INTRAVENOUS | Status: DC
Start: 2018-07-27 — End: 2018-07-27

## 2018-07-27 MED ORDER — FUROSEMIDE 10 MG/ML IJ SOLN
40.00 | INTRAMUSCULAR | Status: DC
Start: 2018-07-27 — End: 2018-07-27

## 2018-07-28 MED ORDER — METRONIDAZOLE IN NACL 5-0.79 MG/ML-% IV SOLN
500.00 | INTRAVENOUS | Status: DC
Start: 2018-07-28 — End: 2018-07-28

## 2018-07-28 MED ORDER — CALCIUM CHLORIDE 10 % IV SOLN
1.00 | INTRAVENOUS | Status: DC
Start: ? — End: 2018-07-28

## 2018-07-28 MED ORDER — HYDRALAZINE HCL 20 MG/ML IJ SOLN
15.00 | INTRAMUSCULAR | Status: DC
Start: ? — End: 2018-07-28

## 2018-07-28 MED ORDER — LABETALOL HCL 5 MG/ML IV SOLN
30.00 | INTRAVENOUS | Status: DC
Start: ? — End: 2018-07-28

## 2018-07-28 MED ORDER — CEFEPIME HCL 2 GM/100ML IV SOLN
2.00 | INTRAVENOUS | Status: DC
Start: 2018-07-28 — End: 2018-07-28

## 2018-07-28 MED ORDER — BISACODYL 10 MG RE SUPP
10.00 | RECTAL | Status: DC
Start: ? — End: 2018-07-28

## 2018-07-28 MED ORDER — CLEVIDIPINE 25 MG/50ML IV EMUL
0.00 | INTRAVENOUS | Status: DC
Start: ? — End: 2018-07-28

## 2018-07-28 MED ORDER — MAGNESIUM HYDROXIDE 400 MG/5ML PO SUSP
30.00 | ORAL | Status: DC
Start: ? — End: 2018-07-28

## 2018-07-28 MED ORDER — GENERIC EXTERNAL MEDICATION
2.00 | Status: DC
Start: 2018-08-05 — End: 2018-07-28

## 2018-07-28 MED ORDER — FENTANYL CITRATE (PF) 50 MCG/ML IJ SOLN
25.00 | INTRAMUSCULAR | Status: DC
Start: ? — End: 2018-07-28

## 2018-07-28 MED ORDER — HYDRALAZINE HCL 25 MG PO TABS
25.00 | ORAL_TABLET | ORAL | Status: DC
Start: 2018-07-28 — End: 2018-07-28

## 2018-07-28 MED ORDER — GENERIC EXTERNAL MEDICATION
150.00 | Status: DC
Start: 2018-07-29 — End: 2018-07-28

## 2018-08-01 MED ORDER — HYDRALAZINE HCL 20 MG/ML IJ SOLN
20.00 | INTRAMUSCULAR | Status: DC
Start: ? — End: 2018-08-01

## 2018-08-01 MED ORDER — FLUCONAZOLE 40 MG/ML PO SUSR
400.00 | ORAL | Status: DC
Start: 2018-08-02 — End: 2018-08-01

## 2018-08-01 MED ORDER — LISINOPRIL 10 MG PO TABS
5.00 | ORAL_TABLET | ORAL | Status: DC
Start: 2018-08-02 — End: 2018-08-01

## 2018-08-01 MED ORDER — CARVEDILOL 12.5 MG PO TABS
25.00 | ORAL_TABLET | ORAL | Status: DC
Start: 2018-08-05 — End: 2018-08-01

## 2018-08-01 MED ORDER — ONDANSETRON HCL 4 MG/2ML IJ SOLN
4.00 | INTRAMUSCULAR | Status: DC
Start: ? — End: 2018-08-01

## 2018-08-01 MED ORDER — LABETALOL HCL 5 MG/ML IV SOLN
20.00 | INTRAVENOUS | Status: DC
Start: ? — End: 2018-08-01

## 2018-08-01 MED ORDER — CLOPIDOGREL BISULFATE 75 MG PO TABS
75.00 | ORAL_TABLET | ORAL | Status: DC
Start: 2018-08-06 — End: 2018-08-01

## 2018-08-01 MED ORDER — POTASSIUM CHLORIDE CRYS ER 10 MEQ PO TBCR
40.00 | EXTENDED_RELEASE_TABLET | ORAL | Status: DC
Start: 2018-08-01 — End: 2018-08-01

## 2018-08-01 MED ORDER — GENERIC EXTERNAL MEDICATION
1.00 | Status: DC
Start: 2018-08-06 — End: 2018-08-01

## 2018-08-01 MED ORDER — GENERIC EXTERNAL MEDICATION
12.50 | Status: DC
Start: ? — End: 2018-08-01

## 2018-08-01 MED ORDER — HYDRALAZINE HCL 25 MG PO TABS
100.00 | ORAL_TABLET | ORAL | Status: DC
Start: 2018-08-05 — End: 2018-08-01

## 2018-08-01 MED ORDER — MAGNESIUM SULFATE 2 GM/50ML IV SOLN
2.00 | INTRAVENOUS | Status: DC
Start: 2018-08-01 — End: 2018-08-01

## 2018-08-01 MED ORDER — BISACODYL 10 MG RE SUPP
10.00 | RECTAL | Status: DC
Start: ? — End: 2018-08-01

## 2018-08-01 MED ORDER — FENTANYL CITRATE (PF) 50 MCG/ML IJ SOLN
25.00 | INTRAMUSCULAR | Status: DC
Start: ? — End: 2018-08-01

## 2018-08-02 MED ORDER — FLUCONAZOLE 200 MG PO TABS
400.00 | ORAL_TABLET | ORAL | Status: DC
Start: 2018-08-06 — End: 2018-08-02

## 2018-08-02 MED ORDER — ONDANSETRON 4 MG PO TBDP
4.00 | ORAL_TABLET | ORAL | Status: DC
Start: ? — End: 2018-08-02

## 2018-08-02 MED ORDER — LISINOPRIL 10 MG PO TABS
40.00 | ORAL_TABLET | ORAL | Status: DC
Start: 2018-08-06 — End: 2018-08-02

## 2018-08-03 ENCOUNTER — Other Ambulatory Visit: Payer: Medicare PPO

## 2018-08-03 MED ORDER — DRONABINOL 2.5 MG PO CAPS
2.50 | ORAL_CAPSULE | ORAL | Status: DC
Start: 2018-08-04 — End: 2018-08-03

## 2018-08-03 MED ORDER — AMLODIPINE BESYLATE 5 MG PO TABS
5.00 | ORAL_TABLET | ORAL | Status: DC
Start: 2018-08-04 — End: 2018-08-03

## 2018-08-03 MED ORDER — POTASSIUM CHLORIDE 20 MEQ PO PACK
40.00 | PACK | ORAL | Status: DC
Start: 2018-08-03 — End: 2018-08-03

## 2018-08-04 MED ORDER — DRONABINOL 2.5 MG PO CAPS
5.00 | ORAL_CAPSULE | ORAL | Status: DC
Start: 2018-08-05 — End: 2018-08-04

## 2018-08-05 ENCOUNTER — Inpatient Hospital Stay (HOSPITAL_COMMUNITY)
Admission: AD | Admit: 2018-08-05 | Discharge: 2018-08-10 | DRG: 862 | Disposition: A | Discharge status: Home Health | Payer: Medicare PPO | Source: Other Acute Inpatient Hospital | Attending: Internal Medicine | Admitting: Internal Medicine

## 2018-08-05 DIAGNOSIS — Z885 Allergy status to narcotic agent status: Secondary | ICD-10-CM

## 2018-08-05 DIAGNOSIS — I341 Nonrheumatic mitral (valve) prolapse: Secondary | ICD-10-CM | POA: Diagnosis present

## 2018-08-05 DIAGNOSIS — F329 Major depressive disorder, single episode, unspecified: Secondary | ICD-10-CM | POA: Diagnosis present

## 2018-08-05 DIAGNOSIS — C482 Malignant neoplasm of peritoneum, unspecified: Secondary | ICD-10-CM | POA: Diagnosis not present

## 2018-08-05 DIAGNOSIS — R64 Cachexia: Secondary | ICD-10-CM | POA: Diagnosis present

## 2018-08-05 DIAGNOSIS — C786 Secondary malignant neoplasm of retroperitoneum and peritoneum: Secondary | ICD-10-CM | POA: Diagnosis present

## 2018-08-05 DIAGNOSIS — Z803 Family history of malignant neoplasm of breast: Secondary | ICD-10-CM

## 2018-08-05 DIAGNOSIS — Z8542 Personal history of malignant neoplasm of other parts of uterus: Secondary | ICD-10-CM | POA: Diagnosis not present

## 2018-08-05 DIAGNOSIS — B9561 Methicillin susceptible Staphylococcus aureus infection as the cause of diseases classified elsewhere: Secondary | ICD-10-CM | POA: Diagnosis not present

## 2018-08-05 DIAGNOSIS — Z800 Family history of malignant neoplasm of digestive organs: Secondary | ICD-10-CM

## 2018-08-05 DIAGNOSIS — R110 Nausea: Secondary | ICD-10-CM | POA: Diagnosis not present

## 2018-08-05 DIAGNOSIS — Z681 Body mass index (BMI) 19 or less, adult: Secondary | ICD-10-CM | POA: Diagnosis not present

## 2018-08-05 DIAGNOSIS — R1310 Dysphagia, unspecified: Secondary | ICD-10-CM | POA: Diagnosis present

## 2018-08-05 DIAGNOSIS — I130 Hypertensive heart and chronic kidney disease with heart failure and stage 1 through stage 4 chronic kidney disease, or unspecified chronic kidney disease: Secondary | ICD-10-CM | POA: Diagnosis present

## 2018-08-05 DIAGNOSIS — E43 Unspecified severe protein-calorie malnutrition: Secondary | ICD-10-CM | POA: Diagnosis present

## 2018-08-05 DIAGNOSIS — R109 Unspecified abdominal pain: Secondary | ICD-10-CM | POA: Diagnosis present

## 2018-08-05 DIAGNOSIS — Z8544 Personal history of malignant neoplasm of other female genital organs: Secondary | ICD-10-CM | POA: Diagnosis not present

## 2018-08-05 DIAGNOSIS — Z515 Encounter for palliative care: Secondary | ICD-10-CM | POA: Diagnosis not present

## 2018-08-05 DIAGNOSIS — N739 Female pelvic inflammatory disease, unspecified: Secondary | ICD-10-CM

## 2018-08-05 DIAGNOSIS — E785 Hyperlipidemia, unspecified: Secondary | ICD-10-CM | POA: Diagnosis present

## 2018-08-05 DIAGNOSIS — E876 Hypokalemia: Secondary | ICD-10-CM | POA: Diagnosis present

## 2018-08-05 DIAGNOSIS — G4730 Sleep apnea, unspecified: Secondary | ICD-10-CM | POA: Diagnosis present

## 2018-08-05 DIAGNOSIS — Z801 Family history of malignant neoplasm of trachea, bronchus and lung: Secondary | ICD-10-CM

## 2018-08-05 DIAGNOSIS — I509 Heart failure, unspecified: Secondary | ICD-10-CM | POA: Diagnosis present

## 2018-08-05 DIAGNOSIS — I714 Abdominal aortic aneurysm, without rupture: Secondary | ICD-10-CM | POA: Diagnosis not present

## 2018-08-05 DIAGNOSIS — Z8679 Personal history of other diseases of the circulatory system: Secondary | ICD-10-CM

## 2018-08-05 DIAGNOSIS — C569 Malignant neoplasm of unspecified ovary: Secondary | ICD-10-CM | POA: Diagnosis present

## 2018-08-05 DIAGNOSIS — Z79899 Other long term (current) drug therapy: Secondary | ICD-10-CM

## 2018-08-05 DIAGNOSIS — K651 Peritoneal abscess: Secondary | ICD-10-CM | POA: Diagnosis present

## 2018-08-05 DIAGNOSIS — D631 Anemia in chronic kidney disease: Secondary | ICD-10-CM | POA: Diagnosis present

## 2018-08-05 DIAGNOSIS — N183 Chronic kidney disease, stage 3 (moderate): Secondary | ICD-10-CM | POA: Diagnosis present

## 2018-08-05 DIAGNOSIS — J449 Chronic obstructive pulmonary disease, unspecified: Secondary | ICD-10-CM | POA: Diagnosis present

## 2018-08-05 DIAGNOSIS — Z8249 Family history of ischemic heart disease and other diseases of the circulatory system: Secondary | ICD-10-CM

## 2018-08-05 DIAGNOSIS — Z7902 Long term (current) use of antithrombotics/antiplatelets: Secondary | ICD-10-CM

## 2018-08-05 DIAGNOSIS — T8143XA Infection following a procedure, organ and space surgical site, initial encounter: Secondary | ICD-10-CM | POA: Diagnosis present

## 2018-08-05 DIAGNOSIS — I719 Aortic aneurysm of unspecified site, without rupture: Secondary | ICD-10-CM | POA: Insufficient documentation

## 2018-08-05 DIAGNOSIS — F419 Anxiety disorder, unspecified: Secondary | ICD-10-CM | POA: Diagnosis present

## 2018-08-05 DIAGNOSIS — Z87891 Personal history of nicotine dependence: Secondary | ICD-10-CM

## 2018-08-05 DIAGNOSIS — Z823 Family history of stroke: Secondary | ICD-10-CM

## 2018-08-05 DIAGNOSIS — I671 Cerebral aneurysm, nonruptured: Secondary | ICD-10-CM | POA: Diagnosis present

## 2018-08-05 DIAGNOSIS — Z9221 Personal history of antineoplastic chemotherapy: Secondary | ICD-10-CM | POA: Diagnosis not present

## 2018-08-05 DIAGNOSIS — K632 Fistula of intestine: Secondary | ICD-10-CM | POA: Diagnosis not present

## 2018-08-05 DIAGNOSIS — Z8042 Family history of malignant neoplasm of prostate: Secondary | ICD-10-CM

## 2018-08-05 DIAGNOSIS — R627 Adult failure to thrive: Secondary | ICD-10-CM | POA: Diagnosis present

## 2018-08-05 DIAGNOSIS — Z95828 Presence of other vascular implants and grafts: Secondary | ICD-10-CM | POA: Diagnosis not present

## 2018-08-05 DIAGNOSIS — I700 Atherosclerosis of aorta: Secondary | ICD-10-CM | POA: Diagnosis present

## 2018-08-05 DIAGNOSIS — Z8619 Personal history of other infectious and parasitic diseases: Secondary | ICD-10-CM

## 2018-08-05 DIAGNOSIS — Z8543 Personal history of malignant neoplasm of ovary: Secondary | ICD-10-CM | POA: Diagnosis not present

## 2018-08-05 DIAGNOSIS — Z9889 Other specified postprocedural states: Secondary | ICD-10-CM | POA: Diagnosis not present

## 2018-08-05 DIAGNOSIS — Z978 Presence of other specified devices: Secondary | ICD-10-CM | POA: Diagnosis not present

## 2018-08-05 DIAGNOSIS — F17211 Nicotine dependence, cigarettes, in remission: Secondary | ICD-10-CM | POA: Diagnosis not present

## 2018-08-05 DIAGNOSIS — I330 Acute and subacute infective endocarditis: Secondary | ICD-10-CM | POA: Diagnosis not present

## 2018-08-05 DIAGNOSIS — Z7982 Long term (current) use of aspirin: Secondary | ICD-10-CM

## 2018-08-05 DIAGNOSIS — Z8049 Family history of malignant neoplasm of other genital organs: Secondary | ICD-10-CM

## 2018-08-05 LAB — CBC
HCT: 30.3 % — ABNORMAL LOW (ref 36.0–46.0)
Hemoglobin: 9.8 g/dL — ABNORMAL LOW (ref 12.0–15.0)
MCH: 23.2 pg — ABNORMAL LOW (ref 26.0–34.0)
MCHC: 32.3 g/dL (ref 30.0–36.0)
MCV: 71.8 fL — ABNORMAL LOW (ref 80.0–100.0)
Platelets: 330 10*3/uL (ref 150–400)
RBC: 4.22 MIL/uL (ref 3.87–5.11)
RDW: 23.4 % — ABNORMAL HIGH (ref 11.5–15.5)
WBC: 10.8 10*3/uL — ABNORMAL HIGH (ref 4.0–10.5)
nRBC: 0 % (ref 0.0–0.2)

## 2018-08-05 LAB — CREATININE, SERUM
Creatinine, Ser: 0.76 mg/dL (ref 0.44–1.00)
GFR calc Af Amer: >60 mL/min (ref >60)

## 2018-08-05 MED ORDER — DRONABINOL 2.5 MG PO CAPS
2.5000 mg | ORAL_CAPSULE | Freq: Two times a day (BID) | ORAL | Status: DC
  Administered 2018-08-06: 2.5 mg via ORAL
  Filled 2018-08-05: qty 1

## 2018-08-05 MED ORDER — ASPIRIN 81 MG PO CHEW
81.0000 mg | CHEWABLE_TABLET | Freq: Every day | ORAL | Status: DC
  Administered 2018-08-05 – 2018-08-10 (×6): 81 mg via ORAL
  Filled 2018-08-05 (×6): qty 1

## 2018-08-05 MED ORDER — ONDANSETRON HCL 4 MG/2ML IJ SOLN
4.0000 mg | Freq: Four times a day (QID) | INTRAMUSCULAR | Status: DC | PRN
  Administered 2018-08-07 – 2018-08-08 (×2): 4 mg via INTRAVENOUS
  Filled 2018-08-05 (×2): qty 2

## 2018-08-05 MED ORDER — SENNA 8.6 MG PO TABS
1.0000 | ORAL_TABLET | Freq: Every day | ORAL | Status: DC
  Administered 2018-08-05 – 2018-08-09 (×5): 8.6 mg via ORAL
  Filled 2018-08-05 (×5): qty 1

## 2018-08-05 MED ORDER — CARVEDILOL 25 MG PO TABS
25.0000 mg | ORAL_TABLET | Freq: Two times a day (BID) | ORAL | Status: DC
  Administered 2018-08-06 – 2018-08-10 (×9): 25 mg via ORAL
  Filled 2018-08-05 (×10): qty 1

## 2018-08-05 MED ORDER — ONDANSETRON HCL 4 MG PO TABS: 4.0000 mg | ORAL_TABLET | Freq: Four times a day (QID) | ORAL | Status: DC | PRN

## 2018-08-05 MED ORDER — SODIUM CHLORIDE 0.9 % IV SOLN
1.0000 g | INTRAVENOUS | Status: DC
  Administered 2018-08-05 – 2018-08-09 (×5): 1000 mg via INTRAVENOUS
  Filled 2018-08-05 (×5): qty 1

## 2018-08-05 MED ORDER — FLUCONAZOLE 100 MG PO TABS
400.0000 mg | ORAL_TABLET | Freq: Every day | ORAL | Status: DC
  Administered 2018-08-05 – 2018-08-10 (×6): 400 mg via ORAL
  Filled 2018-08-05 (×6): qty 4

## 2018-08-05 MED ORDER — DOCUSATE SODIUM 100 MG PO CAPS
100.0000 mg | ORAL_CAPSULE | Freq: Two times a day (BID) | ORAL | Status: DC
  Administered 2018-08-06 – 2018-08-09 (×7): 100 mg via ORAL
  Filled 2018-08-05 (×10): qty 1

## 2018-08-05 MED ORDER — MIRTAZAPINE 15 MG PO TABS
30.0000 mg | ORAL_TABLET | Freq: Every day | ORAL | Status: DC
  Administered 2018-08-05 – 2018-08-09 (×5): 30 mg via ORAL
  Filled 2018-08-05 (×5): qty 2

## 2018-08-05 MED ORDER — MAGNESIUM OXIDE 400 (241.3 MG) MG PO TABS
400.0000 mg | ORAL_TABLET | Freq: Every day | ORAL | Status: DC
  Administered 2018-08-05 – 2018-08-10 (×6): 400 mg via ORAL
  Filled 2018-08-05 (×6): qty 1

## 2018-08-05 MED ORDER — KETOROLAC TROMETHAMINE 30 MG/ML IJ SOLN
15.0000 mg | Freq: Once | INTRAMUSCULAR | Status: AC
  Administered 2018-08-05: 15 mg via INTRAVENOUS
  Filled 2018-08-05: qty 1

## 2018-08-05 MED ORDER — AMLODIPINE BESYLATE 5 MG PO TABS
5.0000 mg | ORAL_TABLET | Freq: Every day | ORAL | Status: DC
  Administered 2018-08-05 – 2018-08-10 (×6): 5 mg via ORAL
  Filled 2018-08-05 (×6): qty 1

## 2018-08-05 MED ORDER — HEPARIN SODIUM (PORCINE) 5000 UNIT/ML IJ SOLN
5000.0000 [IU] | Freq: Three times a day (TID) | INTRAMUSCULAR | Status: DC
  Administered 2018-08-05 – 2018-08-10 (×15): 5000 [IU] via SUBCUTANEOUS
  Filled 2018-08-05 (×15): qty 1

## 2018-08-05 MED ORDER — CLOPIDOGREL BISULFATE 75 MG PO TABS
75.0000 mg | ORAL_TABLET | Freq: Every day | ORAL | Status: DC
  Administered 2018-08-05 – 2018-08-10 (×6): 75 mg via ORAL
  Filled 2018-08-05 (×6): qty 1

## 2018-08-05 MED ORDER — HYDRALAZINE HCL 50 MG PO TABS
50.0000 mg | ORAL_TABLET | Freq: Three times a day (TID) | ORAL | Status: DC
  Administered 2018-08-05 – 2018-08-10 (×13): 50 mg via ORAL
  Filled 2018-08-05 (×13): qty 1

## 2018-08-05 MED ORDER — SODIUM CHLORIDE 0.9% FLUSH: 10.0000 mL | INTRAVENOUS | Status: DC | PRN

## 2018-08-05 MED ORDER — LISINOPRIL 40 MG PO TABS
40.0000 mg | ORAL_TABLET | Freq: Every day | ORAL | Status: DC
  Administered 2018-08-05 – 2018-08-10 (×6): 40 mg via ORAL
  Filled 2018-08-05 (×6): qty 1

## 2018-08-05 NOTE — H&P (Signed)
History and Physical   Alyia Lacerte SVX:[id number] DOB: 25-Nov-1945 DOA: 08/05/2018  Referring MD/NP/PA: Back from [facility]  PCP: [name], MD   Outpatient Specialists: [name], MD  Patient coming from: [facility]  Chief Complaint: Abdominal pain  HPI: Jo Parker is a 73 y.o. 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  Patient was subsequently transferred to [facility] due to rapid expansion of her aneurysm.  At [facility] she was accepted and treated by vascular surgeons.  She initially had endovascular procedure because she was not a candidate for open surgery.  Patient had 2 vessels FEVAR involving celiac and SMA stents placed.  Postoperatively she had complications including sepsis due to worsening intra-abdominal abscess.  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  She has a drain in place.  Patient was seen with infectious disease at [facility].  She was on multiple antibiotics initially but currently transition to Invanz and fluconazole with recommendation for 6 weeks treatment.  Patient also recommended to be on permanent antibiotic suppression.  Patient has other medical issues addressed including hypertension.  Medications have been changed from her home regimen.  She was transferred back to [facility] after she has improved and now disposition is the main issue.  She has had previous MSSA on June 30, 2018.  At that time she has been on vancomycin and Zosyn.  As of now she is afebrile.  Her drain appears to be dried no active drainage.  Her main problem is anorexia at the moment.  Patient is being admitted  and will continue care here and disposition. Review of Systems: As per HPI otherwise 10 point review of systems negative.    Past Medical History:  Diagnosis Date  . Acute on chronic respiratory failure with hypoxia ([place]) 03/07/2018  . Anxiety and depression   . Aortic aneurysm ([place])    see ov note 03/2017   . Arthritis   . Aspiration pneumonia ([place])   . Carcinomatosis ([place]) 10/2017  . CHF (congestive heart failure) ([name])    diastolic dysfunction  . COPD (chronic obstructive pulmonary disease) ([place]) 10/23/2017  . Depression   . Family history of breast cancer   . Family history of prostate cancer   . Family history of uterine cancer   . History of diverticulitis   . HLD (hyperlipidemia) 10/23/2017  . Hypertension   . Mitral valve prolapse   . Pelvic abscess in female   . Sleep apnea    cpap machine mask and tubing in [place] per patient.  Patient does not have mask or tubing to bring on DOS of 01/27/2018.   Marland Kitchen Uterine cancer ([place])    had partial hysterectomy at 27    Past Surgical History:  Procedure Laterality Date  . ABDOMINAL HYSTERECTOMY    . cardiac monitor implant    . CEREBRAL ANEURYSM REPAIR    . CERVICAL SPINE SURGERY     bone spurs  . DEBULKING N/A 01/27/2018   Procedure: RADICAL DEBULKING;  Surgeon: [name], MD;  Location: [facility]S;  Service: Gynecology;  Laterality: N/A;  . IR CATHETER TUBE CHANGE  06/13/2018  . IR FLUORO GUIDE PORT INSERTION RIGHT  10/14/2017  .  IR PARACENTESIS  10/14/2017  . IR RADIOLOGIST EVAL & MGMT  04/26/2018  . IR RADIOLOGIST EVAL & MGMT  05/10/2018  . IR RADIOLOGIST EVAL & MGMT  05/25/2018  . IR RADIOLOGIST EVAL & MGMT  07/20/2018  . IR REMOVAL TUN ACCESS W/ PORT W/O FL MOD SED  07/02/2018  . IR US GUIDE VASC ACCESS RIGHT  10/14/2017  . LAPAROTOMY N/A 01/27/2018   Procedure: EXPLORATORY LAPAROTOMY WITH REMOVAL OF BILATERAL TUBES AND OVARIES;  Surgeon: [name], MD;  Location: [facility];  Service: Gynecology;  Laterality: N/A;  .  LAPAROTOMY N/A 01/31/2018   Procedure: EXPLORATORY LAPAROTOMY, ABDOMINAL WASHOUT;  Surgeon: Isabel Caprice, MD;  Location: [facility];  Service: Gynecology;  Laterality: N/A;  . OMENTECTOMY N/A 01/27/2018   Procedure: OMENTECTOMY;  Surgeon: [name], MD;  Location: [facility];  Service: Gynecology;  Laterality: N/A;  . PARTIAL HYSTERECTOMY  1971   "precancer"  . TEE WITHOUT CARDIOVERSION N/A 07/04/2018   Procedure: TRANSESOPHAGEAL ECHOCARDIOGRAM (TEE);  Surgeon: Sanda Klein, MD;  Location: [facility] ENDOSCOPY;  Service: Cardiovascular;  Laterality: N/A;     reports that she quit smoking about 9 months ago. She has a 55.00 pack-year smoking history. She has never used smokeless tobacco. She reports that she does not drink alcohol or use drugs.  Allergies  Allergen Reactions  . Oxycodone Other (See Comments)    confusion    Family History  Problem Relation Age of Onset  . Endometrial cancer Sister 57       endometrial ca  . Endometrial cancer Paternal Aunt 80  . Breast cancer Paternal Aunt   . Stomach cancer Maternal Grandfather        d. 25s  . Prostate cancer Paternal Uncle   . Breast cancer Cousin        pat first cousin  . Breast cancer Cousin        pat first cousin's daughter  . Lung cancer Father        lung ca  . Heart disease Mother   . Stroke Paternal Grandmother   . Endometrial cancer Cousin        paternal first cousin's daughter  . Breast cancer Other        niece dx less than 30s  . Breast cancer Other        niece, dx under 49     Prior to Admission medications   Medication Sig Start Date End Date Taking? Authorizing Provider  carvedilol (COREG) 12.5 MG tablet Take 12.5 mg by mouth 2 (two) times daily with a meal.    [provider]  magnesium oxide (MAG-OX) 400 MG tablet Take 400 mg by mouth daily.    [provider]  mirtazapine (REMERON) 30 MG tablet Take 1 tablet (30 mg total) by mouth at bedtime. 05/13/18   [name], MD  Multiple  Vitamin (MULTIVITAMIN WITH MINERALS) TABS tablet Take 1 tablet by mouth daily.    [provider]    Physical Exam: Vitals:   08/05/18 1957  BP: (!) 145/65  Pulse: 86  Resp: 16  Temp: 99.6 F (37.6 C)  TempSrc: Oral  SpO2: 100%      Constitutional: NAD, calm, chronically ill looking and cachectic Vitals:   08/05/18 1957  BP: (!) 145/65  Pulse: 86  Resp: 16  Temp: 99.6 F (37.6 C)  TempSrc: Oral  SpO2: 100%   Eyes: PERRL, lids and conjunctivae normal ENMT: Mucous membranes are moist. Posterior pharynx clear of any  exudate or lesions.Normal dentition.  Neck: normal, supple, no masses, no thyromegaly Respiratory: clear to auscultation bilaterally, no wheezing, no crackles. Normal respiratory effort. No accessory muscle use.  Cardiovascular: Regular rate and rhythm, no murmurs / rubs / gallops. No extremity edema. 2+ pedal pulses. No carotid bruits.  Abdomen: Full abdomen with visible scar and drain in place, mild diffuse tenderness, no organomegaly. Bowel sounds positive.  Musculoskeletal: no clubbing / cyanosis. No joint deformity upper and lower extremities. Good ROM, no contractures. Normal muscle tone.  Skin: no rashes, lesions, ulcers. No induration Neurologic: CN 2-12 grossly intact. Sensation intact, DTR normal. Strength 5/5 in all 4.  Psychiatric: Normal judgment and insight. Alert and oriented x 3. Normal mood.     Labs on Admission: I have personally reviewed following labs and imaging studies  CBC: No results for input(s): WBC, NEUTROABS, HGB, HCT, MCV, PLT in the last 168 hours. Basic Metabolic Panel: No results for input(s): NA, K, CL, CO2, GLUCOSE, BUN, CREATININE, CALCIUM, MG, PHOS in the last 168 hours. GFR: Estimated Creatinine Clearance: 44.3 mL/min (by C-G formula based on SCr of 0.65 mg/dL). Liver Function Tests: No results for input(s): AST, ALT, ALKPHOS, BILITOT, PROT, ALBUMIN in the last 168 hours. No results for input(s): LIPASE,  AMYLASE in the last 168 hours. No results for input(s): AMMONIA in the last 168 hours. Coagulation Profile: No results for input(s): INR, PROTIME in the last 168 hours. Cardiac Enzymes: No results for input(s): CKTOTAL, CKMB, CKMBINDEX, TROPONINI in the last 168 hours. BNP (last 3 results) No results for input(s): PROBNP in the last 8760 hours. HbA1C: No results for input(s): HGBA1C in the last 72 hours. CBG: No results for input(s): GLUCAP in the last 168 hours. Lipid Profile: No results for input(s): CHOL, HDL, LDLCALC, TRIG, CHOLHDL, LDLDIRECT in the last 72 hours. Thyroid Function Tests: No results for input(s): TSH, T4TOTAL, FREET4, T3FREE, THYROIDAB in the last 72 hours. Anemia Panel: No results for input(s): VITAMINB12, FOLATE, FERRITIN, TIBC, IRON, RETICCTPCT in the last 72 hours. Urine analysis:    Component Value Date/Time   COLORURINE YELLOW 07/01/2018 1457   APPEARANCEUR CLEAR 07/01/2018 1457   LABSPEC 1.030 07/01/2018 1457   PHURINE 5.0 07/01/2018 1457   GLUCOSEU NEGATIVE 07/01/2018 [place] 07/01/2018 [place] 07/01/2018 [place] 07/01/2018 1457   PROTEINUR NEGATIVE 07/01/2018 1457   NITRITE NEGATIVE 07/01/2018 1457   LEUKOCYTESUR TRACE (A) 07/01/2018 1457   Sepsis Labs: @LABRCNTIP (procalcitonin:4,lacticidven:4) )No results found for this or any previous visit (from the past 240 hour(s)).   Radiological Exams on Admission: No results found.    Assessment/Plan Principal Problem:   Saccular aneurysm Active Problems:   Primary peritoneal carcinomatosis ([name])   Severe protein-calorie malnutrition ([name])   COPD (chronic obstructive pulmonary disease) ([name])   Ovarian cancer ([place])   Colonic fistula     #1 saccular aortic aneurysm: Status post endovascular stenting.  Patient is stable now.  Continue with antibiotics for suspected mycotic aneurysm.  We will continue with Invanz and fluconazole for 6 weeks.   Patient has a follow-up appointment with her vascular surgeons at [facility] Dr. Sammuel Hines.  Tight blood pressure control.  PT and OT consultation.  #2 hypertension: Her blood pressure is better controlled.  Currently on amlodipine hydralazine as well as lisinopril.  We will continue with this regimen.  #3 peritoneal abscesses: Identified colonic fistula as indicated.  Continue with current antibiotics with Invanz and fluconazole.  Infectious disease consult  in the morning to advise on antibiotic regimen.  #4 anorexia: Patient on Marinol.  Son reported that she is unable to tolerate Marinol Diamox due to sleepiness.  She will take it for just 5 days.  Megace may be another alternative.  #5 COPD: No exacerbation.  Continue monitoring.  #6 protein calorie malnutrition: Patient is cachectic.  We will get nutrition consult.  #7 chronic kidney disease stage III: Some acute exacerbation with nephrology consult at [facility].  We may get nephrology consult here as well.  DVT prophylaxis: Heparin Code Status: Full code Family Communication: Son at bedside.  Cell phone number is [phone] Disposition Plan: To be determined Consults called: Infectious disease as well as nephrology consult may be called in the morning. Admission status: Inpatient to progressive care  Severity of Illness: The appropriate patient status for this patient is INPATIENT. Inpatient status is judged to be reasonable and necessary in order to provide the required intensity of service to ensure the patient's safety. The patient's presenting symptoms, physical exam findings, and initial radiographic and laboratory data in the context of their chronic comorbidities is felt to place them at high risk for further clinical deterioration. Furthermore, it is not anticipated that the patient will be medically stable for discharge from the hospital within 2 midnights of admission. The following factors support the patient status of inpatient.   " The  patient's presenting symptoms include aortic aneurysm. " The worrisome physical exam findings include cachexia with some mild abdominal tenderness. " The initial radiographic and laboratory data are worrisome because of stable. " The chronic co-morbidities include history of carcinomatosis with ovarian cancer.   * I certify that at the point of admission it is my clinical judgment that the patient will require inpatient hospital care spanning beyond 2 midnights from the point of admission due to high intensity of service, high risk for further deterioration and high frequency of surveillance required.Barbette Merino MD [facility] Pager [phone]  If 7PM-7AM, please contact night-coverage [email or web address] Password [facility]  08/05/2018, 8:12 PM

## 2018-08-05 NOTE — Progress Notes (Signed)
Pt arrived to unit 5 minutes prior to shift change. No signs of acute distress at the time this RN met pt.  Admitting MD paged by NT secretary, will come to see pt.

## 2018-08-06 ENCOUNTER — Other Ambulatory Visit: Payer: Self-pay

## 2018-08-06 DIAGNOSIS — Z9221 Personal history of antineoplastic chemotherapy: Secondary | ICD-10-CM

## 2018-08-06 DIAGNOSIS — E43 Unspecified severe protein-calorie malnutrition: Secondary | ICD-10-CM

## 2018-08-06 DIAGNOSIS — Z9889 Other specified postprocedural states: Secondary | ICD-10-CM

## 2018-08-06 DIAGNOSIS — I714 Abdominal aortic aneurysm, without rupture: Secondary | ICD-10-CM

## 2018-08-06 DIAGNOSIS — N739 Female pelvic inflammatory disease, unspecified: Secondary | ICD-10-CM

## 2018-08-06 DIAGNOSIS — C482 Malignant neoplasm of peritoneum, unspecified: Secondary | ICD-10-CM

## 2018-08-06 DIAGNOSIS — F17211 Nicotine dependence, cigarettes, in remission: Secondary | ICD-10-CM

## 2018-08-06 DIAGNOSIS — Z8543 Personal history of malignant neoplasm of ovary: Secondary | ICD-10-CM

## 2018-08-06 DIAGNOSIS — Z8619 Personal history of other infectious and parasitic diseases: Secondary | ICD-10-CM

## 2018-08-06 DIAGNOSIS — Z8544 Personal history of malignant neoplasm of other female genital organs: Secondary | ICD-10-CM

## 2018-08-06 LAB — COMPREHENSIVE METABOLIC PANEL
ALT: 12 U/L (ref 0–44)
AST: 26 U/L (ref 15–41)
Albumin: 2.4 g/dL — ABNORMAL LOW (ref 3.5–5.0)
Alkaline Phosphatase: 79 U/L (ref 38–126)
Anion gap: 11 (ref 5–15)
BUN: 11 mg/dL (ref 8–23)
CO2: 27 mmol/L (ref 22–32)
Calcium: 9 mg/dL (ref 8.9–10.3)
Chloride: 95 mmol/L — ABNORMAL LOW (ref 98–111)
Creatinine, Ser: 0.71 mg/dL (ref 0.44–1.00)
GFR calc Af Amer: >60 mL/min (ref >60)
GFR calc non Af Amer: >60 mL/min (ref >60)
Glucose, Bld: 94 mg/dL (ref 70–99)
POTASSIUM: 2.8 mmol/L — AB (ref 3.5–5.1)
Sodium: 133 mmol/L — ABNORMAL LOW (ref 135–145)
TOTAL PROTEIN: 6.9 g/dL (ref 6.5–8.1)
Total Bilirubin: 0.6 mg/dL (ref 0.3–1.2)

## 2018-08-06 LAB — CBC
HCT: 30.2 % — ABNORMAL LOW (ref 36.0–46.0)
Hemoglobin: 9.5 g/dL — ABNORMAL LOW (ref 12.0–15.0)
MCH: 22.7 pg — ABNORMAL LOW (ref 26.0–34.0)
MCHC: 31.5 g/dL (ref 30.0–36.0)
MCV: 72.2 fL — ABNORMAL LOW (ref 80.0–100.0)
Platelets: 308 10*3/uL (ref 150–400)
RBC: 4.18 MIL/uL (ref 3.87–5.11)
RDW: 23.3 % — ABNORMAL HIGH (ref 11.5–15.5)
WBC: 9.5 10*3/uL (ref 4.0–10.5)
nRBC: 0 % (ref 0.0–0.2)

## 2018-08-06 MED ORDER — ORAL CARE MOUTH RINSE
15.0000 mL | Freq: Two times a day (BID) | OROMUCOSAL | Status: DC
  Administered 2018-08-06 – 2018-08-09 (×8): 15 mL via OROMUCOSAL

## 2018-08-06 MED ORDER — KETOROLAC TROMETHAMINE 30 MG/ML IJ SOLN
15.0000 mg | Freq: Once | INTRAMUSCULAR | Status: AC
  Administered 2018-08-06: 15 mg via INTRAVENOUS
  Filled 2018-08-06: qty 1

## 2018-08-06 MED ORDER — POTASSIUM CHLORIDE CRYS ER 20 MEQ PO TBCR
40.0000 meq | EXTENDED_RELEASE_TABLET | ORAL | Status: AC
  Administered 2018-08-06 (×2): 40 meq via ORAL
  Filled 2018-08-06 (×2): qty 2

## 2018-08-06 NOTE — Progress Notes (Signed)
RN got pt to the bedside commode, pt urinated 100 ml urine, post void bladder scan showed 480. Order placed by Kennon Holter, NP for acute urinary retention. In and out catheterization performed at the bedside 5w23 by Henry Russel, RN with assistance of [place], [place], [place] at [phone]. Procedure explained to pt and perineal care performed. Bedpad changed and sterile technique maintained throughout procedure. Well tolerated by pt, 500 ml of clear amber urine obtained by catheterization.  Pt verbalized relief. Will contine to monitor and treat pt.

## 2018-08-06 NOTE — Progress Notes (Signed)
Patient ID: Jo Parker, female   DOB: 07/25/1945, 73 y.o.   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  She underwent pelvic drain placement at [facility] and was placed on ertapenem and Diflucan per ID recommendations for 6 weeks.  She was transferred back to [facility] on 08/05/2018.  Assessment & Plan:   Principal Problem:   Saccular aneurysm Active Problems:   Primary peritoneal carcinomatosis ([place])   Severe protein-calorie malnutrition ([name])   COPD (chronic obstructive pulmonary disease) ([name])   Ovarian cancer ([name])   Colonic fistula  Saccular abdominal aortic aneurysm status post two-vessel FEVAR with celiac and SMA stenting on 07/25/2018 by vascular surgery at [facility] -Outpatient follow-up with vascular surgeon at [facility] Dr. Sammuel Hines  Pelvic abscess with colonic fistula -After vascular intervention, course was complicated by pelvic abscess with colonic fistula which required IR drainage.  Patient currently has a drain.  ID at [facility] recommended Colbert Ewing and Diflucan for 6 weeks.  Have notified ID/Dr. Karolee Ohs here for consultation.  Continue same regimen for now -Monitor labs.  Currently not spiking temperatures -Consulted general surgery who recommended to consult patient's regular gynecologist for the same.  Hypertension -Blood pressure stable.  Continue amlodipine and  hydralazine  Anorexia -Continue Marinol  COPD  -no exacerbation.  Continue monitoring  Protein calorie malnutrition -Nutrition consult  Chronic kidney disease stage III -Patient had some acute kidney injury during her hospitalization at [facility], which has resolved.  Currently creatinine stable.  Monitor  Hypokalemia -Replace.  Repeat a.m. labs  Chronic anemia from anemia of chronic kidney disease -No signs of bleeding currently.  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  Generalized conditioning -PT eval   DVT prophylaxis: Heparin Code Status: Full Family Communication: Spoke to patient and brother at bedside Disposition Plan: Depends on clinical outcome  Consultants: ID.  Consult gynecology  Procedures: None.  Recent two-vessel FEVAR with celiac and SMA stenting on 07/25/2018 by vascular surgery at [facility].  Pelvic abscess drain placed at [facility].  Antimicrobials:  Ertapenem and Diflucan  Subjective: Patient seen and examined at bedside.  She is a poor historian.  She is awake, denies any worsening abdominal pain, nausea or vomiting.  Objective: Vitals:   08/06/18 0400 08/06/18 0509 08/06/18 0635 08/06/18 0645  BP:   124/67   Pulse:  78    Resp:  (!) 23    Temp: 98.3 F (36.8 C) 98.5 F (36.9 C)    TempSrc: Oral     SpO2:  98%    Weight:    45.9 kg  Height:    5\' 3"  (1.6 m)    Intake/Output Summary (Last 24 hours) at 08/06/2018 1139 Last data filed at 08/06/2018 1038 Gross per 24 hour  Intake 220 ml  Output 600 ml  Net -380 ml   Filed Weights   08/06/18 0645  Weight: 45.9 kg  Examination:  General exam: Appears calm and comfortable.  Looks chronically ill.  No distress Respiratory system: Bilateral decreased breath sounds at bases, no wheezing Cardiovascular system: S1 & S2 heard, Rate  controlled Gastrointestinal system: Abdomen is nondistended, soft and mildly tender in the lower quadrant.  Lower quadrant drain present. normal bowel sounds heard. Extremities: No cyanosis, clubbing, edema  Central nervous system: Alert and oriented. No focal neurological deficits. Moving extremities Skin: No rashes, lesions or ulcers Psychiatry: Flat affect.    Data Reviewed: I have personally reviewed following labs and imaging studies  CBC: Recent Labs  Lab 08/05/18 2114 08/06/18 0323  WBC 10.8* 9.5  HGB 9.8* 9.5*  HCT 30.3* 30.2*  MCV 71.8* 72.2*  PLT 330 166   Basic Metabolic Panel: Recent Labs  Lab 08/05/18 2114 08/06/18 0323  NA  --  133*  K  --  2.8*  CL  --  95*  CO2  --  27  GLUCOSE  --  94  BUN  --  11  CREATININE 0.76 0.71  CALCIUM  --  9.0   GFR: Estimated Creatinine Clearance: 45.4 mL/min (by C-G formula based on SCr of 0.71 mg/dL). Liver Function Tests: Recent Labs  Lab 08/06/18 0323  AST 26  ALT 12  ALKPHOS 79  BILITOT 0.6  PROT 6.9  ALBUMIN 2.4*   No results for input(s): LIPASE, AMYLASE in the last 168 hours. No results for input(s): AMMONIA in the last 168 hours. Coagulation Profile: No results for input(s): INR, PROTIME in the last 168 hours. Cardiac Enzymes: No results for input(s): CKTOTAL, CKMB, CKMBINDEX, TROPONINI in the last 168 hours. BNP (last 3 results) No results for input(s): PROBNP in the last 8760 hours. HbA1C: No results for input(s): HGBA1C in the last 72 hours. CBG: No results for input(s): GLUCAP in the last 168 hours. Lipid Profile: No results for input(s): CHOL, HDL, LDLCALC, TRIG, CHOLHDL, LDLDIRECT in the last 72 hours. Thyroid Function Tests: No results for input(s): TSH, T4TOTAL, FREET4, T3FREE, THYROIDAB in the last 72 hours. Anemia Panel: No results for input(s): VITAMINB12, FOLATE, FERRITIN, TIBC, IRON, RETICCTPCT in the last 72 hours. Sepsis Labs: No results for input(s): PROCALCITON, LATICACIDVEN in  the last 168 hours.  No results found for this or any previous visit (from the past 240 hour(s)).       Radiology Studies: No results found.      Scheduled Meds: . amLODipine  5 mg Oral Daily  . aspirin  81 mg Oral Daily  . carvedilol  25 mg Oral BID WC  . clopidogrel  75 mg Oral Daily  . docusate sodium  100 mg Oral BID  . dronabinol  2.5 mg Oral BID AC  . fluconazole  400 mg Oral Daily  . heparin  5,000 Units Subcutaneous Q8H  . hydrALAZINE  50 mg Oral Q8H  . lisinopril  40 mg Oral Daily  . magnesium oxide  400 mg Oral Daily  . mouth rinse  15 mL Mouth Rinse BID  . mirtazapine  30 mg Oral QHS  . potassium chloride  40 mEq Oral Q4H  . senna  1 tablet Oral QHS   Continuous Infusions: . ertapenem 1,000 mg (08/05/18 2248)     LOS: 1 day        Aline August, MD [facility] 08/06/2018, 11:39 AM

## 2018-08-06 NOTE — Consult Note (Signed)
[facility] for Infectious Disease  Total days of antibiotics 8 (with gram negative coverage)         Reason for Consult:intra-abdominal infection    Referring Physician: powell  Principal Problem:   Saccular aneurysm Active Problems:   Primary peritoneal carcinomatosis ([place])   Severe protein-calorie malnutrition ([place])   COPD (chronic obstructive pulmonary disease) ([place])   Ovarian cancer ([place])   Colonic fistula    HPI: Jo Parker is a 73 y.o. female who is known to me. 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 She was transferred to [facility] where she underwent endovascular stent palcement FEVAR involving celiac and SMA . This was complicated by the development of intra-abd/pelvic abscess (2/20) when it was found she had fistula from descending and sigmoid colon. She underwent drain placement and on ertapenem and fluconazole x 6 wk  Then converted to life long oral abtx suppression. She was transferred back from [facility] on 2/28 for further management. During this time, has had decreased oral intake, loss of appetite, weight loss. Son reports that she was very sleep and fatigued when she was started on marinol and remeron. Son at her bedside encouraging to eat  Past Medical History:  Diagnosis Date  . Acute on chronic respiratory failure with hypoxia ([place]) 03/07/2018  . Anxiety and depression   . Aortic aneurysm ([place])    see ov note 03/2017   . Arthritis   . Aspiration pneumonia ([place])   . Carcinomatosis ([place]) 10/2017  . CHF (congestive heart failure) ([name])    diastolic dysfunction  . COPD (chronic obstructive  pulmonary disease) ([place]) 10/23/2017  . Depression   . Family history of breast cancer   . Family history of prostate cancer   . Family history of uterine cancer   . History of diverticulitis   . HLD (hyperlipidemia) 10/23/2017  . Hypertension   . Mitral valve prolapse   . Pelvic abscess in female   . Sleep apnea    cpap machine mask and tubing in [place] per patient.  Patient does not have mask or tubing to bring on DOS of 01/27/2018.   Marland Kitchen Uterine cancer ([place])    had partial hysterectomy at 27    Allergies:  Allergies  Allergen Reactions  . Oxycodone Other (See Comments)    confusion     MEDICATIONS: . amLODipine  5 mg Oral Daily  . aspirin  81 mg Oral Daily  . carvedilol  25 mg Oral BID WC  . clopidogrel  75 mg Oral Daily  . docusate sodium  100 mg Oral BID  . dronabinol  2.5 mg Oral BID AC  . fluconazole  400 mg Oral Daily  . heparin  5,000 Units Subcutaneous Q8H  . hydrALAZINE  50 mg Oral Q8H  . lisinopril  40 mg Oral Daily  . magnesium oxide  400 mg Oral Daily  . mouth rinse  15 mL Mouth Rinse BID  . mirtazapine  30 mg Oral QHS  . potassium chloride  40 mEq Oral Q4H  . senna  1 tablet Oral QHS    Social History   Tobacco Use  .  Smoking status: Former Smoker    Packs/day: 1.00    Years: 55.00    Pack years: 55.00    Last attempt to quit: 10/06/2017    Years since quitting: 0.8  . Smokeless tobacco: Never Used  Substance Use Topics  . Alcohol use: Never    Frequency: Never  . Drug use: Never    Family History  Problem Relation Age of Onset  . Endometrial cancer Sister 69       endometrial ca  . Endometrial cancer Paternal Aunt 80  . Breast cancer Paternal Aunt   . Stomach cancer Maternal Grandfather        d. 54s  . Prostate cancer Paternal Uncle   . Breast cancer Cousin        pat first cousin  . Breast cancer Cousin        pat first cousin's daughter  . Lung cancer Father        lung ca  . Heart disease Mother   . Stroke Paternal Grandmother     . Endometrial cancer Cousin        paternal first cousin's daughter  . Breast cancer Other        niece dx less than 80s  . Breast cancer Other        niece, dx under 85    Review of Systems  Constitutional: + fatigue. Negative for fever, chills, diaphoresis, activity change, appetite change, HENT: Negative for congestion, sore throat, rhinorrhea, sneezing, trouble swallowing and sinus pressure.  Eyes: Negative for photophobia and visual disturbance.  Respiratory: Negative for cough, chest tightness, shortness of breath, wheezing and stridor.  Cardiovascular: Negative for chest pain, palpitations and leg swelling.  Gastrointestinal: + decrease appetite + weight loss Genitourinary: Negative for dysuria, hematuria, flank pain and difficulty urinating.  Musculoskeletal: Negative for myalgias, back pain, joint swelling, arthralgias and gait problem.  Skin: Negative for color change, pallor, rash and wound.  Neurological: Negative for dizziness, tremors, weakness and light-headedness.  Hematological: Negative for adenopathy. Does not bruise/bleed easily.  Psychiatric/Behavioral: Negative for behavioral problems, confusion, sleep disturbance, dysphoric mood, decreased concentration and agitation.     OBJECTIVE: Temp:  [97.9 F (36.6 C)-99.6 F (37.6 C)] 98.5 F (36.9 C) (02/29 0509) Pulse Rate:  [78-89] 78 (02/29 0509) Resp:  [16-27] 23 (02/29 0509) BP: (124-158)/(65-87) 124/67 (02/29 0635) SpO2:  [98 %-100 %] 98 % (02/29 0509) Weight:  [45.9 kg] 45.9 kg (02/29 0645) Physical Exam  Constitutional:  oriented to person, place, and time. appears malnourished, chronically ill. No distress.  HENT: [place]/AT, PERRLA, no scleral icterus, bitemporal wasting Mouth/Throat: Oropharynx is dry. Food in mouth. No oropharyngeal exudate.  Cardiovascular: Normal rate, regular rhythm and normal heart sounds. Exam reveals no gallop and no friction rub.  No murmur heard.  Pulmonary/Chest: Effort normal  and breath sounds normal. No respiratory distress.  has no wheezes.  Neck = supple, no nuchal rigidity Abdominal: Soft. Bowel sounds are decreased, no distention, small drain with milky purulent fluid Ext: with left arm picc line is c/d/i Neurological: alert and oriented to person, place, and time.  Skin: Skin is warm and dry. No rash noted. No erythema.    LABS: Results for orders placed or performed during the hospital encounter of 08/05/18 (from the past 48 hour(s))  CBC     Status: Abnormal   Collection Time: 08/05/18  9:14 PM  Result Value Ref Range   WBC 10.8 (H) 4.0 - 10.5 K/uL   RBC 4.22  3.87 - 5.11 MIL/uL   Hemoglobin 9.8 (L) 12.0 - 15.0 g/dL   HCT 30.3 (L) 36.0 - 46.0 %   MCV 71.8 (L) 80.0 - 100.0 fL   MCH 23.2 (L) 26.0 - 34.0 pg   MCHC 32.3 30.0 - 36.0 g/dL   RDW 23.4 (H) 11.5 - 15.5 %   Platelets 330 150 - 400 K/uL   nRBC 0.0 0.0 - 0.2 %    Comment: Performed at [name] [name] [place], [place] [address]  Creatinine, serum     Status: None   Collection Time: 08/05/18  9:14 PM  Result Value Ref Range   Creatinine, Ser 0.76 0.44 - 1.00 mg/dL   GFR calc non Af Amer >60 >60 mL/min   GFR calc Af Amer >60 >60 mL/min    Comment: Performed at [facility] [address] [place], [place] [address]  Comprehensive metabolic panel     Status: Abnormal   Collection Time: 08/06/18  3:23 AM  Result Value Ref Range   Sodium 133 (L) 135 - 145 mmol/L   Potassium 2.8 (L) 3.5 - 5.1 mmol/L   Chloride 95 (L) 98 - 111 mmol/L   CO2 27 22 - 32 mmol/L   Glucose, Bld 94 70 - 99 mg/dL   BUN 11 8 - 23 mg/dL   Creatinine, Ser 0.71 0.44 - 1.00 mg/dL   Calcium 9.0 8.9 - 10.3 mg/dL   Total Protein 6.9 6.5 - 8.1 g/dL   Albumin 2.4 (L) 3.5 - 5.0 g/dL   AST 26 15 - 41 U/L   ALT 12 0 - 44 U/L   Alkaline Phosphatase 79 38 - 126 U/L   Total Bilirubin 0.6 0.3 - 1.2 mg/dL   GFR calc non Af Amer >60 >60 mL/min   GFR calc Af Amer >60 >60 mL/min   Anion gap 11 5 - 15     Comment: Performed at [facility], [place] [address] [place], [place] [address]  CBC     Status: Abnormal   Collection Time: 08/06/18  3:23 AM  Result Value Ref Range   WBC 9.5 4.0 - 10.5 K/uL   RBC 4.18 3.87 - 5.11 MIL/uL   Hemoglobin 9.5 (L) 12.0 - 15.0 g/dL   HCT 30.2 (L) 36.0 - 46.0 %   MCV 72.2 (L) 80.0 - 100.0 fL   MCH 22.7 (L) 26.0 - 34.0 pg   MCHC 31.5 30.0 - 36.0 g/dL   RDW 23.3 (H) 11.5 - 15.5 %   Platelets 308 150 - 400 K/uL   nRBC 0.0 0.0 - 0.2 %    Comment: Performed at [facility], [place] [address], [place], [place] [address]    MICRO: 2/20 micro cx -reviewed IMAGING: No results found.  Assessment/Plan:  73yo F with mycotic aneurysm noted on 2/14 s/p endovascular stenting c/b intra abdominal abscess s/p drain placement - continue on broad spectrum abtx with ertapenem plus fluconazole x 6 wk using 2/21 as day 1, thus 5 more weeks of IV therapy  Severe protein calorie malnutrition = would get nutrition consult. Will start on boost, which she prefers  Sedation = consider stopping marinol for a few days to see if she is more alert  Hx of mssa bacteremia c/b mycotic aneurysm = did not have further isolation. Continue on current abtx

## 2018-08-06 NOTE — Plan of Care (Signed)
  Problem: Nutrition: Goal: Adequate nutrition will be maintained Outcome: Not Progressing   Patient continues with poor appetite and poor oral intake.

## 2018-08-06 NOTE — Progress Notes (Signed)
Pt unable to urinate, did not have urine output since 1900. All attempts to urinate failed. Bladder scan showed 590 ml of urine in bladder. [place], NP notified.

## 2018-08-06 NOTE — Consult Note (Signed)
Reason for Consult: Pelvic abscess with drain Referring Physician: Dr. Cyndi Bender  Jo Parker is an 73 y.o. female.  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  This was found while following her aortic aneurysm.  She completed 4 cycles of chemotherapy and on 01/27/2018, underwent  Exploratory laparotomy, infra-gastric omentectomy bilateral salpingo-oophorectomy radical optimal tumor debulking, resection of anterior abdominal wall, by Dr. [name].  She been diagnosed with stage IV ovarian epithelial cancer.  She was taken back to the operating room on 01/31/2018 by Dr. Precious Haws with tachycardia, pneumoperitoneum and concern for bowel perforation.  Exploratory laparotomy with washout was performed.  Drains were left in place general surgery was contacted and Dr. Johney Maine assisted with insufflation of the rectum and no leak was found.  She had significant postoperative issues including; shock with presumed sepsis from intra-abdominal source, ARDS/aspiration pneumonia, requiring tracheostomy and prolonged ventilator support.    She was discharged on 03/04/2018.    She was readmitted to the hospital on 06/30/2018 with weakness and sepsis due to MSSA bacteremia echocardiogram was negative and she was ultimately discharged home on IV antibiotics, via PICC line, on 07/07/2018. She was seen in January by Dr. Annamarie Major vascular surgery for abdominal aortic aneurysm.  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  She was referred to [facility] because the aneurysm was very close to the celiac artery and she would possibly need a visceral reconstruction.   She returned today hospital again on 07/21/2018, she was seen by Dr. Monica Martinez vascular surgery.  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  With her chronic pelvic drain for microperforation during her original peritoneal carcinomatosis surgery and other chronic issues he recommended transfer to [facility] for tertiary care.  She was also seen by Dr. 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  He recommended continuing antibiotics and agreed with plans for transfer.    Patient transferred to [facility] on 07/23/2018.  Yesterday she was transferred back to [facility] from [facility].  It appears she was placed on Invanz and fluconazole for 6-week.  And she has a follow-up appointment with the vascular surgeons at [facility] (Dr. Sammuel Hines.)  She has an identified colonic fistula and remains on IV antibiotics.    She is only been seen by our service once that was on 11/15/6787, by Dr. Leighton Ruff.  At that time she was tentatively planning a flexible sigmoidoscopy to visualize the colon mucosa and evaluate for colonic stricture.  If there is no stricture identified she was hoping to be able to withdraw the drain over several week.  Allow the fistula to heal behind it.  This was obviously deferred in light of her mycotic aneurysm and subsequent transfer to [facility].  IR drain placement 04/18/2018, fistulogram at that time showed a tract between the drain and the adjacent sigmoid colon.  There was no large abscess cavity.  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  The drain catheter was changed on 06/13/2018 study showed persistent  fistulous communication between the  drain catheter in the sigmoid colon.  Repeat CT on 07/01/2018 also shows persistent fistula between the sigmoid and the catheter.  Fistulogram 07/28/2010 shows persistent fistula.    We are asked to see to help with management of the  IR drain.   Past Medical History:  Diagnosis Date  . Acute on chronic respiratory failure with hypoxia ([place]) 03/07/2018  . Anxiety and depression   . Aortic aneurysm ([place])    see ov note 03/2017   . Arthritis   . Aspiration pneumonia ([place])   . Carcinomatosis ([place]) 10/2017  . CHF (congestive heart failure) ([name])    diastolic dysfunction  . COPD (chronic obstructive pulmonary disease) ([place]) 10/23/2017  . Depression   . Family history of breast cancer   . Family history of prostate cancer   . Family history of uterine cancer   . History of diverticulitis   . HLD (hyperlipidemia) 10/23/2017  . Hypertension   . Mitral valve prolapse   . Pelvic abscess in female   . Sleep apnea    cpap machine mask and tubing in [place] per patient.  Patient does not have mask or tubing to bring on DOS of 01/27/2018.   Marland Kitchen Uterine cancer ([place])    had partial hysterectomy at 27    Past Surgical History:  Procedure Laterality Date  . ABDOMINAL HYSTERECTOMY    . cardiac monitor implant    . CEREBRAL ANEURYSM REPAIR    . CERVICAL SPINE SURGERY     bone spurs  . DEBULKING N/A 01/27/2018   Procedure: RADICAL DEBULKING;  Surgeon: [name], MD;  Location: [facility];  Service: Gynecology;  Laterality: N/A;  . IR CATHETER TUBE CHANGE  06/13/2018  . IR FLUORO GUIDE PORT INSERTION RIGHT  10/14/2017  . IR PARACENTESIS  10/14/2017  . IR RADIOLOGIST EVAL & MGMT  04/26/2018  . IR RADIOLOGIST EVAL & MGMT  05/10/2018  . IR RADIOLOGIST EVAL & MGMT  05/25/2018  . IR RADIOLOGIST EVAL & MGMT  07/20/2018  . IR REMOVAL TUN ACCESS W/ PORT W/O FL MOD SED  07/02/2018  . IR US GUIDE VASC ACCESS RIGHT  10/14/2017  . LAPAROTOMY N/A 01/27/2018   Procedure: EXPLORATORY LAPAROTOMY WITH REMOVAL OF BILATERAL TUBES AND OVARIES;  Surgeon: [name], MD;  Location: [facility];  Service: Gynecology;  Laterality: N/A;  . LAPAROTOMY N/A 01/31/2018   Procedure: EXPLORATORY LAPAROTOMY, ABDOMINAL WASHOUT;  Surgeon: Isabel Caprice, MD;   Location: [facility];  Service: Gynecology;  Laterality: N/A;  . OMENTECTOMY N/A 01/27/2018   Procedure: OMENTECTOMY;  Surgeon: [name], MD;  Location: [facility];  Service: Gynecology;  Laterality: N/A;  . PARTIAL HYSTERECTOMY  1971   "precancer"  . TEE WITHOUT CARDIOVERSION N/A 07/04/2018   Procedure: TRANSESOPHAGEAL ECHOCARDIOGRAM (TEE);  Surgeon: Sanda Klein, MD;  Location: [facility] ENDOSCOPY;  Service: Cardiovascular;  Laterality: N/A;    Family History  Problem Relation Age of Onset  . Endometrial cancer Sister 26       endometrial ca  . Endometrial cancer Paternal Aunt 80  . Breast cancer Paternal Aunt   . Stomach cancer Maternal Grandfather        d. 76s  . Prostate cancer Paternal Uncle   . Breast cancer Cousin        pat first cousin  . Breast cancer Cousin        pat first cousin's daughter  . Lung cancer Father        lung ca  . Heart  disease Mother   . Stroke Paternal Grandmother   . Endometrial cancer Cousin        paternal first cousin's daughter  . Breast cancer Other        niece dx less than 86s  . Breast cancer Other        niece, dx under 55    Social History:  reports that she quit smoking about 10 months ago. She has a 55.00 pack-year smoking history. She has never used smokeless tobacco. She reports that she does not drink alcohol or use drugs.  Allergies:  Allergies  Allergen Reactions  . Oxycodone Other (See Comments)    confusion    Medications:  Prior to Admission:  Medications Prior to Admission  Medication Sig Dispense Refill Last Dose  . amLODipine (NORVASC) 5 MG tablet Take 5 mg by mouth daily.   unknown  . aspirin 81 MG chewable tablet Chew 81 mg by mouth daily.   08/05/2018 at 900  . carvedilol (COREG) 25 MG tablet Take 25 mg by mouth 2 (two) times daily.   08/05/2018 at 900  . clopidogrel (PLAVIX) 75 MG tablet Take 75 mg by mouth daily.   08/05/2018 at 900  . docusate sodium (COLACE) 100 MG capsule Take 100 mg by mouth 2 (two) times daily.    08/05/2018 at 900  . dronabinol (MARINOL) 2.5 MG capsule Take 2.5 mg by mouth 2 (two) times daily. 5 day course 08/03/18 thru 08/08/18 per [facility]   unknown  . ertapenem [facility]) 1 g SOLR injection Inject 1 g into the vein daily.   08/05/2018 at 800  . fluconazole (DIFLUCAN) 200 MG tablet Take 400 mg by mouth daily.   08/05/2018 at 900  . heparin 5000 UNIT/ML injection Inject 5,000 Units into the skin every 8 (eight) hours.   08/05/2018 at 1300  . hydrALAZINE (APRESOLINE) 100 MG tablet Take 50 mg by mouth every 8 (eight) hours.   08/05/2018 at 1300  . lisinopril (PRINIVIL,ZESTRIL) 40 MG tablet Take 40 mg by mouth daily.   08/05/2018 at 900  . magnesium oxide (MAG-OX) 400 MG tablet Take 400 mg by mouth 2 (two) times daily.    08/05/2018 at 900  . mirtazapine (REMERON) 30 MG tablet Take 1 tablet (30 mg total) by mouth at bedtime. 90 tablet 1 unknown  . Pediatric Multiple Vit-C-FA (MULTIVITAMIN ANIMAL SHAPES, WITH CA/FA,) with C & FA chewable tablet Chew 1 tablet by mouth daily.   unknown  . senna (SENOKOT) 8.6 MG TABS tablet Take 2 tablets by mouth at bedtime.   unknown   Scheduled: . amLODipine  5 mg Oral Daily  . aspirin  81 mg Oral Daily  . carvedilol  25 mg Oral BID WC  . clopidogrel  75 mg Oral Daily  . docusate sodium  100 mg Oral BID  . dronabinol  2.5 mg Oral BID AC  . fluconazole  400 mg Oral Daily  . heparin  5,000 Units Subcutaneous Q8H  . hydrALAZINE  50 mg Oral Q8H  . lisinopril  40 mg Oral Daily  . magnesium oxide  400 mg Oral Daily  . mouth rinse  15 mL Mouth Rinse BID  . mirtazapine  30 mg Oral QHS  . potassium chloride  40 mEq Oral Q4H  . senna  1 tablet Oral QHS   Continuous: . ertapenem 1,000 mg (08/05/18 2248)   Anti-infectives (From admission, onward)   Start     Dose/Rate Route Frequency Ordered Stop  08/05/18 2200  ertapenem (INVANZ) 1,000 mg in sodium chloride 0.9 % 100 mL IVPB     1 g 200 mL/hr over 30 Minutes Intravenous Every 24 hours 08/05/18 2007      08/05/18 2100  fluconazole (DIFLUCAN) tablet 400 mg     400 mg Oral Daily 08/05/18 2007        Results for orders placed or performed during the hospital encounter of 08/05/18 (from the past 48 hour(s))  CBC     Status: Abnormal   Collection Time: 08/05/18  9:14 PM  Result Value Ref Range   WBC 10.8 (H) 4.0 - 10.5 K/uL   RBC 4.22 3.87 - 5.11 MIL/uL   Hemoglobin 9.8 (L) 12.0 - 15.0 g/dL   HCT 30.3 (L) 36.0 - 46.0 %   MCV 71.8 (L) 80.0 - 100.0 fL   MCH 23.2 (L) 26.0 - 34.0 pg   MCHC 32.3 30.0 - 36.0 g/dL   RDW 23.4 (H) 11.5 - 15.5 %   Platelets 330 150 - 400 K/uL   nRBC 0.0 0.0 - 0.2 %    Comment: Performed at [facility], [address], [place], [place] [address]  Creatinine, serum     Status: None   Collection Time: 08/05/18  9:14 PM  Result Value Ref Range   Creatinine, Ser 0.76 0.44 - 1.00 mg/dL   GFR calc non Af Amer >60 >60 mL/min   GFR calc Af Amer >60 >60 mL/min    Comment: Performed at [facility] [address] [place], [place] [address]  Comprehensive metabolic panel     Status: Abnormal   Collection Time: 08/06/18  3:23 AM  Result Value Ref Range   Sodium 133 (L) 135 - 145 mmol/L   Potassium 2.8 (L) 3.5 - 5.1 mmol/L   Chloride 95 (L) 98 - 111 mmol/L   CO2 27 22 - 32 mmol/L   Glucose, Bld 94 70 - 99 mg/dL   BUN 11 8 - 23 mg/dL   Creatinine, Ser 0.71 0.44 - 1.00 mg/dL   Calcium 9.0 8.9 - 10.3 mg/dL   Total Protein 6.9 6.5 - 8.1 g/dL   Albumin 2.4 (L) 3.5 - 5.0 g/dL   AST 26 15 - 41 U/L   ALT 12 0 - 44 U/L   Alkaline Phosphatase 79 38 - 126 U/L   Total Bilirubin 0.6 0.3 - 1.2 mg/dL   GFR calc non Af Amer >60 >60 mL/min   GFR calc Af Amer >60 >60 mL/min   Anion gap 11 5 - 15    Comment: Performed at [facility], [place] [address], [place], [place] [address]  CBC     Status: Abnormal   Collection Time: 08/06/18  3:23 AM  Result Value Ref Range   WBC 9.5 4.0 - 10.5 K/uL   RBC 4.18 3.87 - 5.11 MIL/uL   Hemoglobin 9.5 (L) 12.0 - 15.0 g/dL    HCT 30.2 (L) 36.0 - 46.0 %   MCV 72.2 (L) 80.0 - 100.0 fL   MCH 22.7 (L) 26.0 - 34.0 pg   MCHC 31.5 30.0 - 36.0 g/dL   RDW 23.3 (H) 11.5 - 15.5 %   Platelets 308 150 - 400 K/uL   nRBC 0.0 0.0 - 0.2 %    Comment: Performed at [facility], [place] [address], [place], [place] [address]    No results found.  Review of Systems  Constitutional: Negative.   HENT: Negative.  Eyes: Negative.   Respiratory: Positive for cough.   Cardiovascular: Negative.   Gastrointestinal: Positive for abdominal pain (midline abdominal pain and discomfort, is rather continous). Negative for blood in stool, constipation, diarrhea, heartburn, melena, nausea and vomiting.  Genitourinary: Negative.   Musculoskeletal: Negative.   Skin: Negative.   Neurological: Negative.   Endo/Heme/Allergies: Negative.   Psychiatric/Behavioral: Negative.    Blood pressure 124/67, pulse 78, temperature 98.5 F (36.9 C), resp. rate (!) 23, height 5\' 3"  (1.6 m), weight 45.9 kg, SpO2 98 %. Physical Exam  Constitutional: She is oriented to person, place, and time. No distress.  Chronically ill-appearing and frail female in no acute distress.  HENT:  Head: Normocephalic and atraumatic.  Mouth/Throat: Oropharynx is clear and moist. No oropharyngeal exudate.  Eyes: Right eye exhibits no discharge. Left eye exhibits no discharge. No scleral icterus.  Pupils are equal  Neck: Normal range of motion. Neck supple. No JVD present. No tracheal deviation present. No thyromegaly present.  Tracheostomy has been removed  Cardiovascular: Normal rate, regular rhythm, normal heart sounds and intact distal pulses.  No murmur heard. Respiratory: Effort normal and breath sounds normal. No respiratory distress. She has no wheezes. She has no rales. She exhibits no tenderness.  GI: Soft. Bowel sounds are normal. She exhibits no distension and no mass. There is abdominal tenderness. There is no rebound and no guarding.  Drain mid left lower  quadrant, with purulent drainage. Well-healed midline abdominal incision.  Whole thing still remains fairly tender.  Musculoskeletal:        General: No tenderness or edema.  Lymphadenopathy:    She has no cervical adenopathy.  Neurological: She is alert and oriented to person, place, and time. No cranial nerve deficit.  Skin: Skin is warm and dry. No rash noted. She is not diaphoretic. No erythema. No pallor.  Psychiatric: She has a normal mood and affect. Her behavior is normal. Judgment and thought content normal.   Anti-infectives (From admission, onward)   Start     Dose/Rate Route Frequency Ordered Stop   08/05/18 2200  ertapenem (INVANZ) 1,000 mg in sodium chloride 0.9 % 100 mL IVPB     1 g 200 mL/hr over 30 Minutes Intravenous Every 24 hours 08/05/18 2007     08/05/18 2100  fluconazole (DIFLUCAN) tablet 400 mg     400 mg Oral Daily 08/05/18 2007       . ertapenem 1,000 mg (08/05/18 2248)    Assessment/Plan: Malnutrition deconditioning Hx congestive heart failure Hx COPD Hx depression   Stage IVb ovarian cancer  CA 125 23.3 (07/19/08) S/p exploratory laparotomy, infra-gastric omentectomy, bilateral salpingo-oophorectomy, radical optimal tumor debulking resection of anterior wall 01/27/2018 Dr. [name] S/p exploratory laparotomy and washout with no obvious perforation of bowel ischemia or perforation. Sigmoid colon fistula with abscess IR drain placement left pelvic sidewall collection with fistula to the adjacent sigmoid colon  04/18/18 Mycotic aneurysm/MSSA bacteremia without evidence of endocarditis Saccular abdominal aortic aneurysm status post two-vessel FEVAR with celiac and SMA stenting on 07/25/2018 by vascular surgery at [place] follow-up with vascular surgeon at [facility] Dr. Sammuel Hines  FEN: Heart healthy diet ID: Ertapenem/Diflucan DVT: Patient is on Plavix/SCD Follow-up: To be determined.  Plan: Dr. Skeet Latch has been following this since  the patient's surgery.  I recommended giving her office a call and let them see her.  Also recommend ID see the patient again.  We will be available and help in any way we can.  Kennedy Brines 08/06/2018, 10:16 AM

## 2018-08-07 DIAGNOSIS — R627 Adult failure to thrive: Secondary | ICD-10-CM

## 2018-08-07 LAB — BASIC METABOLIC PANEL
ANION GAP: 10 (ref 5–15)
BUN: 10 mg/dL (ref 8–23)
CALCIUM: 9 mg/dL (ref 8.9–10.3)
CO2: 28 mmol/L (ref 22–32)
Chloride: 96 mmol/L — ABNORMAL LOW (ref 98–111)
Creatinine, Ser: 0.71 mg/dL (ref 0.44–1.00)
GFR calc Af Amer: >60 mL/min (ref >60)
GFR calc non Af Amer: >60 mL/min (ref >60)
Glucose, Bld: 130 mg/dL — ABNORMAL HIGH (ref 70–99)
Potassium: 3.7 mmol/L (ref 3.5–5.1)
Sodium: 134 mmol/L — ABNORMAL LOW (ref 135–145)

## 2018-08-07 LAB — CBC WITH DIFFERENTIAL/PLATELET
Abs Immature Granulocytes: 0.03 10*3/uL (ref 0.00–0.07)
Basophils Absolute: 0 10*3/uL (ref 0.0–0.1)
Basophils Relative: 0 %
Eosinophils Absolute: 0.2 10*3/uL (ref 0.0–0.5)
Eosinophils Relative: 2 %
HCT: 31.6 % — ABNORMAL LOW (ref 36.0–46.0)
Hemoglobin: 10.1 g/dL — ABNORMAL LOW (ref 12.0–15.0)
Immature Granulocytes: 0 %
LYMPHS PCT: 20 %
Lymphs Abs: 1.7 10*3/uL (ref 0.7–4.0)
MCH: 22.9 pg — ABNORMAL LOW (ref 26.0–34.0)
MCHC: 32 g/dL (ref 30.0–36.0)
MCV: 71.5 fL — ABNORMAL LOW (ref 80.0–100.0)
Monocytes Absolute: 1 10*3/uL (ref 0.1–1.0)
Monocytes Relative: 12 %
Neutro Abs: 5.3 10*3/uL (ref 1.7–7.7)
Neutrophils Relative %: 66 %
Platelets: 346 10*3/uL (ref 150–400)
RBC: 4.42 MIL/uL (ref 3.87–5.11)
RDW: 23.9 % — ABNORMAL HIGH (ref 11.5–15.5)
WBC: 8.1 10*3/uL (ref 4.0–10.5)
nRBC: 0 % (ref 0.0–0.2)

## 2018-08-07 LAB — C-REACTIVE PROTEIN: CRP: 20 mg/dL — AB (ref <1.0)

## 2018-08-07 LAB — MAGNESIUM: MAGNESIUM: 1.5 mg/dL — AB (ref 1.7–2.4)

## 2018-08-07 MED ORDER — MAGNESIUM SULFATE 2 GM/50ML IV SOLN
2.0000 g | Freq: Once | INTRAVENOUS | Status: AC
  Administered 2018-08-07: 2 g via INTRAVENOUS
  Filled 2018-08-07: qty 50

## 2018-08-07 MED ORDER — TRAMADOL HCL 50 MG PO TABS
50.0000 mg | ORAL_TABLET | Freq: Once | ORAL | Status: AC
  Administered 2018-08-07: 50 mg via ORAL
  Filled 2018-08-07: qty 1

## 2018-08-07 NOTE — Progress Notes (Signed)
[facility] for Infectious Disease    Date of Admission:  08/05/2018   Total days of antibiotics 9          ID: Nisha Dhami is a 73 y.o. female with polymicrobial intra-abdominal abscess in the setting of mssa mycotic aortic aneurysm s/p endovascular stenting Principal Problem:   Saccular aneurysm Active Problems:   Primary peritoneal carcinomatosis ([name])   Severe protein-calorie malnutrition ([name])   COPD (chronic obstructive pulmonary disease) ([name])   Ovarian cancer ([name])   Colonic fistula    Subjective: Sleepy today, poor appetite  Medications:  . amLODipine  5 mg Oral Daily  . aspirin  81 mg Oral Daily  . carvedilol  25 mg Oral BID WC  . clopidogrel  75 mg Oral Daily  . docusate sodium  100 mg Oral BID  . dronabinol  2.5 mg Oral BID AC  . fluconazole  400 mg Oral Daily  . heparin  5,000 Units Subcutaneous Q8H  . hydrALAZINE  50 mg Oral Q8H  . lisinopril  40 mg Oral Daily  . magnesium oxide  400 mg Oral Daily  . mouth rinse  15 mL Mouth Rinse BID  . mirtazapine  30 mg Oral QHS  . senna  1 tablet Oral QHS    Objective: Vital signs in last 24 hours: Temp:  [98.4 F (36.9 C)-98.5 F (36.9 C)] 98.4 F (36.9 C) (02/29 2018) Pulse Rate:  [85-93] 93 (03/01 1109) Resp:  [21-28] 28 (03/01 0308) BP: (131-166)/(64-81) 134/80 (03/01 1109) SpO2:  [98 %-100 %] 100 % (03/01 0308) Weight:  [46.8 kg] 46.8 kg (03/01 0500)  Physical Exam  Constitutional:  Arousable, but sleeping. Appears chronically ill, and malnourished. No distress.  HENT: [place]/AT, PERRLA, no scleral icterus Mouth/Throat: Oropharynx is clear and moist. No oropharyngeal exudate.  Cardiovascular: Normal rate, regular rhythm and normal heart sounds. Exam reveals no gallop and no friction rub.  No murmur heard.  Pulmonary/Chest: Effort normal and breath sounds normal. No respiratory distress.  has no wheezes.  Abdominal: Soft. Bowel sounds are normal. Drain with milky fluid  Skin: Skin is warm and dry.  No rash noted. No erythema.  Psychiatric:flat affect  Lab Results Recent Labs    08/06/18 0323 08/07/18 0326  WBC 9.5 8.1  HGB 9.5* 10.1*  HCT 30.2* 31.6*  NA 133* 134*  K 2.8* 3.7  CL 95* 96*  CO2 27 28  BUN 11 10  CREATININE 0.71 0.71   Liver Panel Recent Labs    08/06/18 0323  PROT 6.9  ALBUMIN 2.4*  AST 26  ALT 12  ALKPHOS 79  BILITOT 0.6   Sedimentation Rate No results for input(s): ESRSEDRATE in the last 72 hours. C-Reactive Protein Recent Labs    08/07/18 0326  CRP 20.0*    Microbiology: reviewed Studies/Results: No results found.   Assessment/Plan: 73yo F with mycotic aneurysm noted on 2/14 s/p endovascular stenting c/b intra abdominal abscess s/p drain placement - continue on broad spectrum abtx with ertapenem plus fluconazole x 6 wk using 2/21 as day 1, thus 5 more weeks of IV therapy  Failure to thrive = would consider having palliative care address goals of care with patient and family  Severe protein calorie malnutrition = would get nutrition consult. Will start on boost, which she prefers  Sedation = I will stop marinol for a few days to see if she is more alert  [facility] Infect[facility] Diseases Cell: [phone] Pager: [phone]  08/07/2018,  12:56 PM

## 2018-08-07 NOTE — Progress Notes (Signed)
Patient ID: Jo Parker, female   DOB: 08-09-1945, 73 y.o.   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  She underwent pelvic drain placement at [facility] and was placed on ertapenem and Diflucan per ID recommendations for 6 weeks.  She was transferred back to [facility] on 08/05/2018.  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 Sammuel Hines  Pelvic abscess with colonic fistula -After vascular intervention, course was complicated by worsening size of pelvic abscess with colonic fistula which required IR intervention for the IR drain.  Patient currently has a drain.  ID at [facility] recommended Colbert Ewing and Diflucan for 6 weeks.   Continue same regimen for now -Monitor labs.  Currently not spiking temperatures.  ID following -Consulted general surgery who recommended to consult patient's regular gynecologist for the same.  Will try and contact regular gynecologist tomorrow.  We will also request IR to look at the  drain tomorrow.  Currently functioning well.  Hypertension -Blood pressure stable.  Continue amlodipine and hydralazine  Anorexia -Continue Marinol  COPD  -no exacerbation.  Continue monitoring  Protein calorie malnutrition -Nutrition consult  Chronic kidney disease stage III -Patient had some acute kidney injury during her hospitalization at [facility], which has resolved.  Currently creatinine stable.  Monitor  Hypomagnesemia  -replace.  Repeat a.m. labs  Hypokalemia -Improved  Chronic anemia from anemia of chronic kidney disease -No signs of bleeding currently.  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  Generalized conditioning -PT recommends SNF.  Social worker consult  DVT prophylaxis: Heparin Code Status: Full Family Communication: Spoke to patient and brother at bedside Disposition Plan: Depends on clinical outcome  Consultants: ID.  We will get in touch with gynecology tomorrow.  Procedures: None.  Recent two-vessel FEVAR with celiac and SMA stenting on 07/25/2018 by vascular surgery at [facility].  Pelvic abscess drain placed at [facility].  Antimicrobials:  Ertapenem and Diflucan  Subjective: Patient seen and examined at bedside.  She is awake, denies any worsening abdominal pain, nausea or vomiting.  Her appetite is poor but slowly improving.  Objective: Vitals:   08/07/18 0307 08/07/18 0308 08/07/18 0500 08/07/18 1109  BP: (!) 165/75 (!) 166/81  134/80  Pulse: 91 90  93  Resp: (!) 28 (!) 28    Temp:      TempSrc:      SpO2: 98% 100%    Weight:   46.8 kg   Height:        Intake/Output Summary (Last 24 hours) at 08/07/2018 1111 Last  data filed at 08/07/2018 0500 Gross per 24 hour  Intake 180 ml  Output 600 ml  Net -420 ml   Filed Weights   08/06/18 0645 08/07/18 0500  Weight: 45.9 kg 46.8 kg     Examination:  General exam: Appears calm and comfortable.  Looks chronically ill.  No acute distress. Respiratory system: Bilateral decreased breath sounds at bases, some scattered crackles Cardiovascular system: Rate controlled, S1-S2 heard Gastrointestinal system: Abdomen is nondistended, soft and mildly tender in the lower quadrant.  Lower quadrant drain present. normal bowel sounds heard. Extremities: No cyanosis, clubbing, edema      Data Reviewed: I have personally reviewed following labs and imaging studies  CBC: Recent Labs  Lab 08/05/18 2114 08/06/18 0323 08/07/18 0326  WBC 10.8* 9.5 8.1  NEUTROABS  --   --  5.3  HGB 9.8* 9.5* 10.1*  HCT 30.3* 30.2* 31.6*  MCV 71.8* 72.2* 71.5*  PLT 330 308 809   Basic Metabolic Panel: Recent Labs  Lab 08/05/18 2114 08/06/18 0323 08/07/18 0326  NA  --  133* 134*  K  --  2.8* 3.7  CL  --  95* 96*  CO2  --  27 28  GLUCOSE  --  94 130*  BUN  --  11 10  CREATININE 0.76 0.71 0.71  CALCIUM  --  9.0 9.0  MG  --   --  1.5*   GFR: Estimated Creatinine Clearance: 46.3 mL/min (by C-G formula based on SCr of 0.71 mg/dL). Liver Function Tests: Recent Labs  Lab 08/06/18 0323  AST 26  ALT 12  ALKPHOS 79  BILITOT 0.6  PROT 6.9  ALBUMIN 2.4*   No results for input(s): LIPASE, AMYLASE in the last 168 hours. No results for input(s): AMMONIA in the last 168 hours. Coagulation Profile: No results for input(s): INR, PROTIME in the last 168 hours. Cardiac Enzymes: No results for input(s): CKTOTAL, CKMB, CKMBINDEX, TROPONINI in the last 168 hours. BNP (last 3 results) No results for input(s): PROBNP in the last 8760 hours. HbA1C: No results for input(s): HGBA1C in the last 72 hours. CBG: No results for input(s): GLUCAP in the last 168 hours. Lipid Profile: No results for input(s): CHOL, HDL, LDLCALC, TRIG, CHOLHDL, LDLDIRECT in the last 72 hours. Thyroid Function Tests: No results for input(s): TSH, T4TOTAL, FREET4,  T3FREE, THYROIDAB in the last 72 hours. Anemia Panel: No results for input(s): VITAMINB12, FOLATE, FERRITIN, TIBC, IRON, RETICCTPCT in the last 72 hours. Sepsis Labs: No results for input(s): PROCALCITON, LATICACIDVEN in the last 168 hours.  No results found for this or any previous visit (from the past 240 hour(s)).       Radiology Studies: No results found.      Scheduled Meds: . amLODipine  5 mg Oral Daily  . aspirin  81 mg Oral Daily  . carvedilol  25 mg Oral BID WC  . clopidogrel  75 mg Oral Daily  . docusate sodium  100 mg Oral BID  . dronabinol  2.5 mg Oral BID AC  . fluconazole  400 mg Oral Daily  . heparin  5,000 Units Subcutaneous Q8H  . hydrALAZINE  50 mg Oral Q8H  . lisinopril  40 mg Oral Daily  . magnesium oxide  400 mg Oral Daily  . mouth rinse  15 mL Mouth Rinse BID  . mirtazapine  30 mg Oral QHS  . senna  1 tablet Oral QHS   Continuous Infusions: . ertapenem 1,000 mg (08/06/18 2250)  . magnesium  sulfate 1 - 4 g bolus IVPB 2 g (08/07/18 1105)     LOS: 2 days        Aline August, MD [facility] 08/07/2018, 11:11 AM

## 2018-08-07 NOTE — Evaluation (Signed)
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  She underwent pelvic drain placement at [facility] and was placed on ertapenem and Diflucan per ID recommendations for 6 weeks.  She was transferred back to [facility] on 08/05/2018   Clinical Impression   PATIENT HAS DECREASED I AND SAFETY WITH ADLS AND ADL TRANSFERS. PNT WAS MOTIVATED TO WORK WITH OT TO MAXIMIZE ABILITY TO CARE FOR HERSELF. PATIENT PLANS FOR SNF FOR REHAB AND ACUTE OT TO FOLLOW. PATIENT HAS DECREASED TOLERANCE FOR ACTIVITY. PATIENT HAS DECREASED STANDING TOLERANCE.     Follow Up Recommendations  SNF    Equipment Recommendations       Recommendations for Other Services       Precautions / Restrictions Precautions Precautions: Fall Restrictions Weight Bearing Restrictions: No      Mobility Bed Mobility Overal bed mobility: Needs Assistance Bed Mobility: Supine to Sit     Supine to sit: Max assist     General bed mobility comments: increased time and effort with max mulit modal cues to come to EOB, assist for LE movement to EOb and trunk elevation to upright, +2 to rotate hips and position at EOb  Transfers Overall transfer level: Needs assistance Equipment used: Rolling walker (2 wheeled) Transfers: Stand Pivot Transfers   Stand pivot transfers: +2 physical assistance;Mod assist       General transfer comment: Multi modal cues  for hand placement and assist for power up and stability    Balance Overall balance assessment: Needs assistance Sitting-balance support: Feet supported Sitting balance-Leahy Scale: Fair     Standing balance support: Bilateral upper extremity supported;During functional activity Standing balance-Leahy Scale: Poor Standing balance comment: heavy reliance on UE support and assist                           ADL either performed or assessed with clinical judgement   ADL Overall ADL's : Needs assistance/impaired Eating/Feeding: Set up   Grooming: Wash/dry hands;Wash/dry face;Minimal assistance   Upper Body Bathing: Moderate assistance;Maximal assistance;Sitting   Lower Body Bathing: Total assistance;+2 for physical assistance   Upper Body Dressing : Maximal assistance;Sitting   Lower Body Dressing: Total assistance;+2 for physical assistance   Toilet Transfer: Moderate assistance;+2 for physical assistance   Toileting- Clothing Manipulation and Hygiene: Total assistance;+2 for physical assistance       Functional mobility during ADLs: Moderate assistance;+2 for physical assistance General ADL Comments: PATIENT WAS LIMITED BY WEAKNESS AND NEEDED 2 PERSON ASSIST FOR ADLS AND MOBILITY. PATIENT MAY NEED MORE CARE THAN SON CAN PROVIDE.      Vision Baseline Vision/History: Wears glasses Wears Glasses: At all times Patient Visual Report: (DOES NOT HAVE GLASSES CURRENTLY.)       Perception     Praxis      Pertinent Vitals/Pain Pain Assessment: 0-10 Pain Score: 8  Pain Location: r side of abd by ribs Pain Intervention(s): Limited activity within patient's tolerance;Monitored during session(patient stated she had already asked for pain medicine)  Hand Dominance Right   Extremity/Trunk Assessment Upper Extremity Assessment Upper Extremity Assessment: Generalized weakness   Lower Extremity Assessment Lower Extremity Assessment: Generalized weakness        Communication Communication Communication: No difficulties   Cognition Arousal/Alertness: Lethargic Behavior During Therapy: Flat affect Overall Cognitive Status: No family/caregiver present to determine baseline cognitive functioning                                 General Comments: patient was able to follow directions. patient was not oriented to place.    General Comments       Exercises     Shoulder Instructions      Home Living Family/patient expects to be discharged to:: Private residence Living Arrangements: Alone Available Help at Discharge: Family Type of Home: House Home Access: Level entry     Home Layout: Two level;Bed/bath upstairs;1/2 bath on main level Alternate Level Stairs-Number of Steps: 13 Alternate Level Stairs-Rails: Right Bathroom Shower/Tub: Occupational psychologist: Standard     Home Equipment: Environmental consultant - 2 wheels;Cane - single point   Additional Comments: all information obtained from previous admission history. Patient poor hisotrian.      Prior Functioning/Environment Level of Independence: Needs assistance  Gait / Transfers Assistance Needed: ambulating with a cane ADL's / Homemaking Assistance Needed: patient states thatr she was taking a sink bath and could getr herself dressed            OT Problem List: Decreased strength;Decreased activity tolerance;Impaired balance (sitting and/or standing);Decreased cognition;Decreased knowledge of use of DME or AE      OT Treatment/Interventions: Self-care/ADL training;DME and/or AE instruction;Therapeutic activities;Patient/family education    OT Goals(Current goals can be found in the care plan section) Acute Rehab OT Goals Patient Stated Goal: to get better OT Goal Formulation: With patient Time For Goal Achievement: 08/21/18 Potential to Achieve Goals: Good  OT Frequency: Min 2X/week   Barriers to D/C: Decreased caregiver support  UNSURE IT PATIENT SON IS  ABLE TO ASSIST AT THIS LEVEL OF CARE.        Co-evaluation              AM-PAC OT "6 Clicks" Daily Activity     Outcome Measure Help from another person eating meals?: A Little Help from another person taking care of personal grooming?: A Lot Help from another person toileting, which includes using toliet, bedpan, or urinal?: A Lot Help from another person bathing (including washing, rinsing, drying)?: Total Help from another person to put on and taking off regular upper body clothing?: A Lot Help from another person to put on and taking off regular lower body clothing?: Total 6 Click Score: 11   End of Session Equipment Utilized During Treatment: Gait belt;Rolling walker Nurse Communication: (OK THERAPY)  Activity Tolerance: Patient limited by fatigue;Patient limited by lethargy;Patient limited by pain Patient left: in chair;with call bell/phone within reach;with chair alarm set  OT Visit Diagnosis: Unsteadiness on feet (R26.81);Muscle weakness (generalized) (M62.81)                Time: 6767-2094 OT Time Calculation (min): 27 min Charges:  OT General Charges $OT Visit: 1 Visit OT Evaluation $OT Eval Moderate Complexity: 1 Mod  6 CLICKS  English Tomer 08/07/2018, 11:49 AM

## 2018-08-07 NOTE — Progress Notes (Signed)
Patient's brother and her niece requested meeting with nurse to discuss issues that concerned them.  These include the following:  1)  Patient states she is having difficulty swallowing and they are requesting a swallow eval as they believe she is aspirating in addition to pocketing her food, 2) requesting  CXR to confirm presence or absence of pneumonia, 3)  Consideration of a Cortrak feeding tube for nutritional support.  They want to address each issue with her MD tomorrow along with the advantages and disadvantages of each.

## 2018-08-07 NOTE — Evaluation (Signed)
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  She underwent pelvic drain placement at [facility] and was placed on ertapenem and Diflucan per ID recommendations for 6 weeks.  She was transferred back to [facility] on 08/05/2018  Clinical Impression  Orders received for PT evaluation. Patient demonstrates deficits in functional mobility as indicated below. Will benefit from continued skilled PT to address deficits and maximize function. Will see as indicated and progress as tolerated.  At this time, patient requiring cues for arousal, cognition, and assist for mobility. Anticipate patient will require ST SNF upon acute discharge.    Follow Up Recommendations SNF;Supervision/Assistance - 24 hour    Equipment Recommendations  (TBD)    Recommendations for Other Services       Precautions / Restrictions Precautions Precautions: Fall Restrictions Weight Bearing Restrictions: No      Mobility  Bed Mobility Overal bed mobility: Needs Assistance Bed Mobility: Supine to Sit     Supine to sit: Max assist     General bed mobility comments: increased time and effort with max mulit modal cues to come to EOB, assist for LE movement to EOb and trunk elevation to upright, +2 to rotate hips and position at EOb  Transfers Overall transfer level: Needs assistance Equipment used: Rolling walker (2 wheeled) Transfers:  Stand Pivot Transfers   Stand pivot transfers: +2 physical assistance;Mod assist       General transfer comment: Multi modal cues for hand placement and assist for power up and stability  Ambulation/Gait Ambulation/Gait assistance: Max assist Gait Distance (Feet): 8 Feet Assistive device: Rolling walker (2 wheeled) Gait Pattern/deviations: Step-to pattern;Decreased stride length;Shuffle;Trunk flexed;Narrow base of support Gait velocity: decreased Gait velocity interpretation: <1.31 ft/sec, indicative of household ambulator General Gait Details: patient with poor ability to ambulate, heavy physical assist to maintain upright, Cues for arousal and manual control provided for RW.   Stairs            Wheelchair Mobility    Modified Rankin (Stroke Patients Only)       Balance Overall balance assessment: Needs assistance Sitting-balance support: Feet supported Sitting balance-Leahy Scale: Fair     Standing balance support: Bilateral upper extremity supported;During functional activity Standing balance-Leahy Scale: Poor Standing balance comment: heavy reliance on UE support and assist                             Pertinent Vitals/Pain Pain Assessment: 0-10 Pain Score: 8  Pain Location: r side of abd by ribs Pain Intervention(s): Limited activity within patient's tolerance;Monitored during session(patient stated she had already asked for pain medicine)    Home Living Family/patient expects to be discharged to:: Private residence Living Arrangements: Alone Available Help at Discharge: Family Type of Home: House Home Access: Level entry     Home Layout: Two level;Bed/bath upstairs;1/2 bath on main level Home Equipment: Walker - 2 wheels;Cane - single point Additional Comments: all information obtained from previous admission history. Patient poor hisotrian.  Prior Function Level of Independence: Needs assistance   Gait / Transfers Assistance Needed:  ambulating with a cane  ADL's / Homemaking Assistance Needed: patient states thatr she was taking a sink bath and could getr herself dressed        Hand Dominance   Dominant Hand: Right    Extremity/Trunk Assessment   Upper Extremity Assessment Upper Extremity Assessment: Generalized weakness    Lower Extremity Assessment Lower Extremity Assessment: Generalized weakness       Communication   Communication: No difficulties  Cognition Arousal/Alertness: Lethargic Behavior During Therapy: Flat affect Overall Cognitive Status: No family/caregiver present to determine baseline cognitive functioning                                 General Comments: patient was able to follow directions. patient was not oriented to place.       General Comments      Exercises     Assessment/Plan    PT Assessment Patient needs continued PT services  PT Problem List Decreased strength;Decreased activity tolerance;Decreased balance;Decreased mobility;Decreased coordination;Decreased cognition;Decreased knowledge of use of DME;Pain       PT Treatment Interventions DME instruction;Gait training;Stair training;Functional mobility training;Therapeutic activities;Therapeutic exercise;Balance training;Neuromuscular re-education;Cognitive remediation;Patient/family education    PT Goals (Current goals can be found in the Care Plan section)  Acute Rehab PT Goals Patient Stated Goal: to get better PT Goal Formulation: With patient/family Time For Goal Achievement: 08/21/18 Potential to Achieve Goals: Fair    Frequency Min 2X/week   Barriers to discharge        Co-evaluation               AM-PAC PT "6 Clicks" Mobility  Outcome Measure Help needed turning from your back to your side while in a flat bed without using bedrails?: A Little Help needed moving from lying on your back to sitting on the side of a flat bed without using bedrails?: A Little Help needed moving to  and from a bed to a chair (including a wheelchair)?: A Lot Help needed standing up from a chair using your arms (e.g., wheelchair or bedside chair)?: A Lot Help needed to walk in hospital room?: A Lot Help needed climbing 3-5 steps with a railing? : A Lot 6 Click Score: 14    End of Session Equipment Utilized During Treatment: Gait belt Activity Tolerance: Patient limited by fatigue;Patient limited by lethargy Patient left: in chair;with call bell/phone within reach;with chair alarm set Nurse Communication: Mobility status PT Visit Diagnosis: Unsteadiness on feet (R26.81);Difficulty in walking, not elsewhere classified (R26.2)    Time: 8366-2947 PT Time Calculation (min) (ACUTE ONLY): 23 min   Charges:   PT Evaluation $PT Eval Moderate Complexity: 1 Mod          Alben Deeds, PT DPT  Board Certified Neurologic Specialist Acute Rehabilitation Services Pager [phone] Office [phone]   Duncan Dull 08/07/2018, 10:49 AM

## 2018-08-08 ENCOUNTER — Telehealth: Payer: Self-pay | Admitting: Oncology

## 2018-08-08 DIAGNOSIS — B9561 Methicillin susceptible Staphylococcus aureus infection as the cause of diseases classified elsewhere: Secondary | ICD-10-CM

## 2018-08-08 DIAGNOSIS — Z95828 Presence of other vascular implants and grafts: Secondary | ICD-10-CM

## 2018-08-08 DIAGNOSIS — C569 Malignant neoplasm of unspecified ovary: Secondary | ICD-10-CM

## 2018-08-08 DIAGNOSIS — Z515 Encounter for palliative care: Secondary | ICD-10-CM

## 2018-08-08 DIAGNOSIS — I719 Aortic aneurysm of unspecified site, without rupture: Secondary | ICD-10-CM

## 2018-08-08 DIAGNOSIS — R110 Nausea: Secondary | ICD-10-CM

## 2018-08-08 DIAGNOSIS — Z885 Allergy status to narcotic agent status: Secondary | ICD-10-CM

## 2018-08-08 DIAGNOSIS — K632 Fistula of intestine: Secondary | ICD-10-CM

## 2018-08-08 DIAGNOSIS — K651 Peritoneal abscess: Secondary | ICD-10-CM

## 2018-08-08 DIAGNOSIS — Z978 Presence of other specified devices: Secondary | ICD-10-CM

## 2018-08-08 LAB — CBC WITH DIFFERENTIAL/PLATELET
BASOS ABS: 0 10*3/uL (ref 0.0–0.1)
BLASTS: 0 %
Band Neutrophils: 0 %
Basophils Relative: 0 %
Eosinophils Absolute: 0.2 10*3/uL (ref 0.0–0.5)
Eosinophils Relative: 2 %
HCT: 32.1 % — ABNORMAL LOW (ref 36.0–46.0)
Hemoglobin: 10.2 g/dL — ABNORMAL LOW (ref 12.0–15.0)
Lymphocytes Relative: 28 %
Lymphs Abs: 2.1 10*3/uL (ref 0.7–4.0)
MCH: 22.7 pg — AB (ref 26.0–34.0)
MCHC: 31.8 g/dL (ref 30.0–36.0)
MCV: 71.3 fL — ABNORMAL LOW (ref 80.0–100.0)
Metamyelocytes Relative: 0 %
Monocytes Absolute: 0.8 10*3/uL (ref 0.1–1.0)
Monocytes Relative: 10 %
Myelocytes: 0 %
NEUTROS ABS: 4.5 10*3/uL (ref 1.7–7.7)
Neutrophils Relative %: 60 %
Other: 0 %
Platelets: 330 10*3/uL (ref 150–400)
Promyelocytes Relative: 0 %
RBC: 4.5 MIL/uL (ref 3.87–5.11)
RDW: 23.3 % — ABNORMAL HIGH (ref 11.5–15.5)
WBC: 7.6 10*3/uL (ref 4.0–10.5)
nRBC: 0 % (ref 0.0–0.2)
nRBC: 0 /100 WBC

## 2018-08-08 LAB — BASIC METABOLIC PANEL
Anion gap: 10 (ref 5–15)
BUN: 10 mg/dL (ref 8–23)
CO2: 29 mmol/L (ref 22–32)
Calcium: 9 mg/dL (ref 8.9–10.3)
Chloride: 96 mmol/L — ABNORMAL LOW (ref 98–111)
Creatinine, Ser: 0.78 mg/dL (ref 0.44–1.00)
GFR calc Af Amer: >60 mL/min (ref >60)
GFR calc non Af Amer: >60 mL/min (ref >60)
Glucose, Bld: 100 mg/dL — ABNORMAL HIGH (ref 70–99)
Potassium: 3.3 mmol/L — ABNORMAL LOW (ref 3.5–5.1)
Sodium: 135 mmol/L (ref 135–145)

## 2018-08-08 LAB — C-REACTIVE PROTEIN: CRP: 15.3 mg/dL — ABNORMAL HIGH (ref <1.0)

## 2018-08-08 LAB — MAGNESIUM: Magnesium: 1.9 mg/dL (ref 1.7–2.4)

## 2018-08-08 MED ORDER — TRAMADOL HCL 50 MG PO TABS
50.0000 mg | ORAL_TABLET | Freq: Four times a day (QID) | ORAL | Status: DC | PRN
  Administered 2018-08-08 – 2018-08-09 (×3): 50 mg via ORAL
  Filled 2018-08-08 (×3): qty 1

## 2018-08-08 MED ORDER — ENSURE ENLIVE PO LIQD
237.0000 mL | Freq: Two times a day (BID) | ORAL | Status: DC
  Administered 2018-08-08 – 2018-08-10 (×4): 237 mL via ORAL

## 2018-08-08 MED ORDER — MEGESTROL ACETATE 400 MG/10ML PO SUSP
400.0000 mg | Freq: Every day | ORAL | Status: DC
  Administered 2018-08-08 – 2018-08-10 (×3): 400 mg via ORAL
  Filled 2018-08-08 (×3): qty 10

## 2018-08-08 MED ORDER — POTASSIUM CHLORIDE CRYS ER 20 MEQ PO TBCR
40.0000 meq | EXTENDED_RELEASE_TABLET | Freq: Once | ORAL | Status: AC
  Administered 2018-08-08: 40 meq via ORAL
  Filled 2018-08-08: qty 2

## 2018-08-08 MED ORDER — DRONABINOL 2.5 MG PO CAPS
2.5000 mg | ORAL_CAPSULE | Freq: Every day | ORAL | Status: DC
  Administered 2018-08-08: 2.5 mg via ORAL
  Filled 2018-08-08: qty 1

## 2018-08-08 NOTE — Telephone Encounter (Signed)
Called [facility] and arranged for a follow up with Dr. Marcello Moores on 08/30/18 at 9 am.

## 2018-08-08 NOTE — Care Management Important Message (Signed)
Important Message  Patient Details  Name: Jo Parker MRN: [id number] Date of Birth: 1946/03/29   Medicare Important Message Given:  Yes    Orbie Pyo 08/08/2018, 4:36 PM

## 2018-08-08 NOTE — Progress Notes (Signed)
Initial Nutrition Assessment  DOCUMENTATION CODES:   Non-severe (moderate) malnutrition in context of chronic illness  INTERVENTION:  Strawberry Ensure Enlive po BID, each supplement provides 350 kcal and 20 grams of protein. Will order snacks in between meals. Liberalize diet to regular if SLP approves.  NUTRITION DIAGNOSIS:   Moderate Malnutrition related to chronic illness(ovarian cancer) as evidenced by moderate fat depletion, severe fat depletion, moderate muscle depletion, severe muscle depletion.  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 She has had previous hysterectomy, and omentectomy. Pt diagnosed w/ expanding aortic aneurysm and was sent to [facility]. S/p stent placement was found to have fistula in descending and sigmoid colon w/ ongoing abscess.  Currently awaiting SLP eval. Palliative has also been consulted to determine goals of care.  Spoke w/ pt at bedside who was very lethargic. She fell asleep 2x while speaking with her.  Pt reports that her appetite is poor. Observed untouched meal tray on bedside table; pt did not want any of it. Per chart, meal completion is anywhere from 0-75%.   Pt says she does enjoy snacking at home (chips, fruit cups, peanut butter) and will have 2-3 meals a day. Encouraged her to try to eat many small meals throughout the day if one meal is overwhelming. Reinforced sources of protein; pt likes yogurt, peanut butter, fish and chicken.   Pt reported that for lunch she will have sausage biscuit and for dinner she enjoys meatloaf or chicken pot pie. She likes to have fruits throughout the day occasionally.   Per chart,  pt has experienced a 3.5% wt loss since 07/04/18 which is not significant for the time frame but the NFPE did reveal moderate to severe muscle and fat depletions. Pt is willing to drink Strawberry Ensure BID and will order snacks. Spoke with MD regarding diet liberalization from [facility] to regular; will wait until SLP evals.   Medications reviewed and include: Colace, Mag-ox 400mg  daily Labs reviewed: K 3.3 (L)   NUTRITION - FOCUSED PHYSICAL EXAM:    Most Recent Value  Orbital Region  Moderate depletion  Upper Arm Region  Severe depletion  Thoracic and Lumbar Region  Moderate depletion  Buccal Region  Moderate depletion  Temple Region  Moderate depletion  Clavicle Bone Region  Severe depletion  Clavicle and Acromion Bone Region  Severe depletion  Scapular Bone Region  Moderate depletion  Dorsal Hand  Severe depletion  Patellar Region  Severe depletion  Anterior Thigh Region  Severe depletion  Posterior Calf Region  Severe depletion  Edema (RD Assessment)  None  Hair  Reviewed  Eyes  Reviewed  Mouth  Reviewed  Skin  Reviewed  Nails  Reviewed       Diet Order:   Diet Order            Diet Heart Room service appropriate? No; Fluid consistency: Thin  Diet effective now              EDUCATION NEEDS:   Education needs have been addressed  Skin:  Skin Assessment: Skin Integrity Issues: Skin Integrity Issues:: Incisions Incisions: Closed; abdomen  Last BM:  3/1  Height:   Ht Readings from Last 1  Encounters:  08/06/18 5\' 3"  (1.6 m)    Weight:   Wt Readings from Last 1 Encounters:  08/07/18 46.8 kg    Ideal Body Weight:  52.27 kg  BMI:  Body mass index is 18.28 kg/m.  Estimated Nutritional Needs:   Kcal:  1450-1650 kcal  Protein:  75-85g   Fluid:  >/=1.5L    [employer] Dietetic Intern

## 2018-08-08 NOTE — Progress Notes (Signed)
Patient ID: Jo Parker, female   DOB: 1945-08-24, 73 y.o.   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  She underwent pelvic drain manipulation by IR at [facility] and was placed on ertapenem and Diflucan per ID recommendations for 6 weeks.  She was transferred back to [facility] on 08/05/2018.  Assessment & Plan:   Principal Problem:   Saccular aneurysm Active Problems:   Primary peritoneal carcinomatosis ([place])   Severe protein-calorie malnutrition ([name])   COPD (chronic obstructive pulmonary disease) ([name])   Ovarian cancer ([name])   Colonic fistula  Saccular abdominal aortic aneurysm status post two-vessel FEVAR with celiac and SMA stenting on 07/25/2018 by vascular surgery at [facility] -Outpatient follow-up with vascular surgeon at [facility] Dr. Sammuel Hines  Pelvic abscess with colonic fistula -After vascular intervention, course was complicated by worsening size of pelvic abscess with colonic fistula which required IR manipulation of the drain.  Patient currently has a drain.  ID at [facility] recommended Colbert Ewing and Diflucan for 6 weeks.   Continue same regimen for now -Monitor labs.  Currently not spiking temperatures.  ID following -Consulted general surgery who recommended to consult patient's regular gynecologist for the same.  Will try and contact inform her gynecologist today.  Patient will need  outpatient gynecology follow-up.  IR evaluation appreciated.  IR drain is currently functioning and patient will need repeat sinogram in 2 weeks.  Hypertension -Blood pressure stable.  Continue amlodipine and hydralazine  Anorexia -Marinol was held for drowsiness.  Patient seems to be more awake.  Resume Marinol.  Awaiting nutrition evaluation.  Question of dysphagia -SLP evaluation.  COPD  -no exacerbation.  Continue monitoring  Protein calorie malnutrition -Nutrition consult  Chronic kidney disease stage III -Patient had some acute kidney injury during her hospitalization at [facility], which has resolved.  Currently creatinine stable.  Monitor  Hypomagnesemia  -Improved.  Hypokalemia -Replace.  Repeat a.m. labs  Chronic anemia from anemia of chronic kidney disease -No signs of bleeding currently.  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  Generalized conditioning -PT recommends SNF.  Social worker consult  DVT prophylaxis: Heparin Code Status: Full Family Communication: Spoke to husband at bedside. Disposition Plan: Depends on clinical outcome  Consultants: ID/IR.  We will get in touch with gynecology today.  Procedures: None.  Recent two-vessel FEVAR with celiac and SMA stenting on 07/25/2018 by vascular surgery at [facility].  Pelvic abscess drain manipulated at [facility].  Antimicrobials:  Ertapenem and Diflucan started on 07/29/2018 at [facility] for total of 6 weeks. Subjective: Patient seen and examined at bedside.  She is awake, denies any worsening abdominal pain, nausea or vomiting.  She feels that her appetite is slightly improving.  Family is concerned about poor appetite and difficulty swallowing.  No overnight fever.  Objective: Vitals:   08/08/18 0200 08/08/18 0249 08/08/18 0300 08/08/18 0651  BP:  (!) 145/74  Marland Kitchen)  160/83  Pulse: 85 85 83 88  Resp: (!) 25 (!) 25 (!) 23 (!) 24  Temp:    98 F (36.7 C)  TempSrc:    Oral  SpO2: 99% 98% 98% 99%  Weight:      Height:        Intake/Output Summary (Last 24 hours) at 08/08/2018 1103 Last data filed at 08/08/2018 0900 Gross per 24 hour  Intake 460 ml  Output 801 ml  Net -341 ml   Filed Weights   08/06/18 0645 08/07/18 0500  Weight: 45.9 kg 46.8 kg    Examination:  General exam: Appears calm and comfortable.  Looks chronically ill.  No distress.   Respiratory system: Bilateral decreased breath sounds at bases, some scattered crackles.  No wheezing Cardiovascular system: S1-S2 heard, rate controlled Gastrointestinal system: Abdomen is nondistended, soft and mildly tender in the lower quadrant.  Lower quadrant drain present. normal bowel sounds heard. Extremities: No cyanosis,  edema      Data Reviewed: I have personally reviewed following labs and imaging studies  CBC: Recent Labs  Lab 08/05/18 2114 08/06/18 0323 08/07/18 0326 08/08/18 0345  WBC 10.8* 9.5 8.1 7.6  NEUTROABS  --   --  5.3 4.5  HGB 9.8* 9.5* 10.1* 10.2*  HCT 30.3* 30.2* 31.6* 32.1*  MCV 71.8* 72.2* 71.5* 71.3*  PLT 330 308 346 703   Basic Metabolic Panel: Recent Labs  Lab 08/05/18 2114 08/06/18 0323 08/07/18 0326 08/08/18 0345  NA  --  133* 134* 135  K  --  2.8* 3.7 3.3*  CL  --  95* 96* 96*  CO2  --  27 28 29   GLUCOSE  --  94 130* 100*  BUN  --  11 10 10   CREATININE 0.76 0.71 0.71 0.78  CALCIUM  --  9.0 9.0 9.0  MG  --   --  1.5* 1.9   GFR: Estimated Creatinine Clearance: 46.3 mL/min (by C-G formula based on SCr of 0.78 mg/dL). Liver Function Tests: Recent Labs  Lab 08/06/18 0323  AST 26  ALT 12  ALKPHOS 79  BILITOT 0.6  PROT 6.9  ALBUMIN 2.4*   No results for input(s): LIPASE, AMYLASE in the last 168 hours. No results for input(s): AMMONIA in the last 168 hours. Coagulation Profile: No results for input(s): INR, PROTIME in the last 168  hours. Cardiac Enzymes: No results for input(s): CKTOTAL, CKMB, CKMBINDEX, TROPONINI in the last 168 hours. BNP (last 3 results) No results for input(s): PROBNP in the last 8760 hours. HbA1C: No results for input(s): HGBA1C in the last 72 hours. CBG: No results for input(s): GLUCAP in the last 168 hours. Lipid Profile: No results for input(s): CHOL, HDL, LDLCALC, TRIG, CHOLHDL, LDLDIRECT in the last 72 hours. Thyroid Function Tests: No results for input(s): TSH, T4TOTAL, FREET4, T3FREE, THYROIDAB in the last 72 hours. Anemia Panel: No results for input(s): VITAMINB12, FOLATE, FERRITIN, TIBC, IRON, RETICCTPCT in the last 72 hours. Sepsis Labs: No results for input(s): PROCALCITON, LATICACIDVEN in the last 168 hours.  No results found for this or any previous visit (from the past 240 hour(s)).       Radiology Studies: No results found.      Scheduled Meds: . amLODipine  5 mg Oral Daily  . aspirin  81 mg Oral Daily  . carvedilol  25 mg Oral BID WC  . clopidogrel  75 mg Oral Daily  . docusate sodium  100 mg Oral BID  . fluconazole  400  mg Oral Daily  . heparin  5,000 Units Subcutaneous Q8H  . hydrALAZINE  50 mg Oral Q8H  . lisinopril  40 mg Oral Daily  . magnesium oxide  400 mg Oral Daily  . mouth rinse  15 mL Mouth Rinse BID  . mirtazapine  30 mg Oral QHS  . senna  1 tablet Oral QHS   Continuous Infusions: . ertapenem Stopped (08/07/18 2258)     LOS: 3 days        Aline August, MD [facility] 08/08/2018, 11:03 AM

## 2018-08-08 NOTE — Progress Notes (Signed)
   Request made for IR to evaluate pelvic drain  Placed in IR 04/18/18 Most recent evaluation in IR: 07/20/18: + fistula and in good position  Most recent evaluation was done at [facility] 07/29/18: The left lower quadrant pelvic drainage catheter is patent. With injection of contrast there is persistent brisk communication of contrast are presented to an adjacent loop of bowel, likely descending colon/sigmoid colon. Recommend flushing with 3 mL twice daily and return in 2 weeks for repeat sinogram.  Wbc wnl; afebrile  Reviewed with Dr Kathlene Cote: No IR needs at this time; + fistula remains-- was just imaged 07/29/18 Would recommend CT with IV and oral Cx if pt has symptoms of pain; fever; leukocytosis

## 2018-08-08 NOTE — Consult Note (Signed)
Consultation Note Date: 08/08/2018   Patient Name: Jo Parker  DOB: [birth year]  MRN: [id number]  Age / Sex: 73 y.o., female  PCP: [name], MD Referring Physician: Aline August, MD  Reason for Consultation: Establishing goals of care and Psychosocial/spiritual support  HPI/Patient Profile: 73 y.o. female  with past medical history of uterine cancer s/p surgery (age 58), stage 4 ovarian cancer s/p chemotherapy and salpingo-oophorectomy, COPD, D-HF,  primary peritoneal carcinomatosis, pelvic abscess with bowel fistula, who was admitted on 08/05/2018 after she had been transferred there for endovascular surgery.  She was found to have MSSA bacteremia from a rapidly growing mycotic aneurysm.  She underwent a FEVAR procedure with celiac and SMA stents placed on 07/25/18.  Afterward she became septic once again from the abscess and fistula.  Of note [name] is well cared for by a very supportive family that includes a Designer, jewellery here a Solicitor and a Neurosurgeon who flies in from out of state.  She is a 2+ moderate assist to stand on a rolling walker.  She is eating very little (albumin 2.4).  Her primary care taker and medical decision making surrogate is her son Jo Parker.  Clinical Assessment and Goals of Care:  I have reviewed medical records including EPIC notes, labs and imaging, received report from the care team, assessed the patient and then met at the bedside along with her husband and her decision making surrogate son Jo Parker to discuss diagnosis prognosis, [place], EOL wishes, disposition and options.  I introduced Palliative Medicine as specialized medical care for people living with serious illness. It focuses on providing relief from the symptoms and stress of a serious illness. The goal is to improve quality of life for both the patient and the family.  We discussed a brief life  review of the patient.  [name] is from [facility].  She raised her 3 sons there.  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  Her family describes her as "spicy ".  Her greatest joy is her family particularly her grandchildren.  Jo Parker provided me with an overview of the last 6 months.  He brought his mother up from [place] when he learned she had recurrent cancer.  She was treated with chemotherapy by Dr. Simeon Craft such and underwent surgery to remove the tumor.  Unfortunately after the surgery there was still a spot on the colon.  Per Jo Parker this has turned into the abscess and fistula.  After surgery she developed postop delirium.  She was on the ventilator for 19 days she had a tracheostomy.  [facility]  She overcame all of that and was discharged to [facility].  He and his wife nursed her back to health and in November 2019 she decided to return to her own home in [place].  She was driving at the time.  She seemed to decline in January.  She had a fall and did  not tell anyone for quite some time.  Her function decreased and she became confused at times.  Jo Parker and his cousin brought her back to [facility] for a medical work-up.  She was found to have MSSA bacteremia and subsequently the mycotic aneurysm.  After surgery for the aneurysm she perked back up.  Per Jo Parker "I have my mother back ".  She declined again after Marinol was started.  The first question [name] asked me was "how long do I have?  ".  I deferred the question as the patient has been so strong and come through so much.  I spoke with Jo Parker privately.  We discussed his mother's comorbidities including heart failure, COPD, primary carcinomatosis, the mycotic aneurysm, severe protein calorie malnutrition, the pelvic abscess with bowel fistula.  Jo Parker understands that his mother's foundation is not stable, rather it is a "house  of cards ".  Jo Parker admits I know she could go at any time.  We discussed CODE STATUS.  Jo Parker admits that the family has decided she will remain a full code because they feel care will be less forthcoming for her if she declares DNR status.  We discussed what DNR really means, and that it only comes into play in cases of cardiac or pulmonary arrest.  Jo Parker understands and in reality he would not want his mother to go through a code, but the family has opted to keep her "full code ".  We talked about artificial feeding and hydration and consideration of a cor track if needed.  Jo Parker stated that his mother has had one before and pulled it out.  She was placed in mitten restraints and was unhappy.  I encouraged Jo Parker to think of his mother's comfort and dignity before another core track was placed.  We are hopeful her appetite will improve.  Questions and concerns were addressed.  Hard Choices booklet left for review. The family was encouraged to call with questions or concerns.    Primary Decision Maker:  Jo Parker and the patient herself.    SUMMARY OF RECOMMENDATIONS    Palliative medicine team will follow along with you.  We will attempt to build rapport with the family and help guide healthcare decision making.  At this point the family Jo Parker) would rather take her home with home health than send her to SNF.  Given her bouts of delirium and peritoneal carcinomatosis she is a very poor candidate for core track or PEG.  I would suggest that these things not be recommended.  Rather I would recommend careful hand feeding with aspiration precautions as this would likely provide her the most enjoyment.  Code Status/Advance Care Planning:  Full code  Additional Recommendations (Limitations, Scope, Preferences):  Aspiration precautions and feeding assistance  Palliative Prophylaxis:   Aspiration and Bowel Regimen  Psycho-social/Spiritual:   Desire for further Chaplaincy  support: Yes  Prognosis: Difficult to determine.  The patient has a mycotic aneurysm.  The infection will be very difficult to cure given her poor nutritional status, bowel fistula, and a compromised immune system.  She is extremely deconditioned and malnourished.  Discharge Planning: Home with Palliative Services and Home health      Primary Diagnoses: Present on Admission: . Primary peritoneal carcinomatosis ([place]) . Severe protein-calorie malnutrition ([place]) . COPD (chronic obstructive pulmonary disease) ([place]) . Ovarian cancer ([place]) . Saccular aneurysm . Colonic fistula   I have reviewed the medical record, interviewed the patient and family, and examined the patient. The following aspects  are pertinent.  Past Medical History:  Diagnosis Date  . Acute on chronic respiratory failure with hypoxia ([place]) 03/07/2018  . Anxiety and depression   . Aortic aneurysm ([place])    see ov note 03/2017   . Arthritis   . Aspiration pneumonia ([place])   . Carcinomatosis ([place]) 10/2017  . CHF (congestive heart failure) ([name])    diastolic dysfunction  . COPD (chronic obstructive pulmonary disease) ([place]) 10/23/2017  . Depression   . Family history of breast cancer   . Family history of prostate cancer   . Family history of uterine cancer   . History of diverticulitis   . HLD (hyperlipidemia) 10/23/2017  . Hypertension   . Mitral valve prolapse   . Pelvic abscess in female   . Sleep apnea    cpap machine mask and tubing in [place] per patient.  Patient does not have mask or tubing to bring on DOS of 01/27/2018.   Marland Kitchen Uterine cancer ([place])    had partial hysterectomy at 1   Social History   Socioeconomic History  . Marital status: Married    Spouse name: Not on file  . Number of children: 3  . Years of education: Not on file  . Highest education level: Not on file  Occupational History  . Occupation: retired  Scientific laboratory technician  . Financial resource strain: Not on file  . Food insecurity:     Worry: Not on file    Inability: Not on file  . Transportation needs:    Medical: Not on file    Non-medical: Not on file  Tobacco Use  . Smoking status: Former Smoker    Packs/day: 1.00    Years: 55.00    Pack years: 55.00    Last attempt to quit: 10/06/2017    Years since quitting: 0.8  . Smokeless tobacco: Never Used  Substance and Sexual Activity  . Alcohol use: Never    Frequency: Never  . Drug use: Never  . Sexual activity: Not Currently  Lifestyle  . Physical activity:    Days per week: Not on file    Minutes per session: Not on file  . Stress: Not on file  Relationships  . Social connections:    Talks on phone: Not on file    Gets together: Not on file    Attends religious service: Not on file    Active member of club or organization: Not on file    Attends meetings of clubs or organizations: Not on file    Relationship status: Not on file  Other Topics Concern  . Not on file  Social History Narrative  . Not on file   Family History  Problem Relation Age of Onset  . Endometrial cancer Sister 32       endometrial ca  . Endometrial cancer Paternal Aunt 80  . Breast cancer Paternal Aunt   . Stomach cancer Maternal Grandfather        d. 48s  . Prostate cancer Paternal Uncle   . Breast cancer Cousin        pat first cousin  . Breast cancer Cousin        pat first cousin's daughter  . Lung cancer Father        lung ca  . Heart disease Mother   . Stroke Paternal Grandmother   . Endometrial cancer Cousin        paternal first cousin's daughter  . Breast cancer Other  niece dx less than 61s  . Breast cancer Other        niece, dx under 50   Scheduled Meds: . amLODipine  5 mg Oral Daily  . aspirin  81 mg Oral Daily  . carvedilol  25 mg Oral BID WC  . clopidogrel  75 mg Oral Daily  . docusate sodium  100 mg Oral BID  . fluconazole  400 mg Oral Daily  . heparin  5,000 Units Subcutaneous Q8H  . hydrALAZINE  50 mg Oral Q8H  . lisinopril  40 mg Oral  Daily  . magnesium oxide  400 mg Oral Daily  . mouth rinse  15 mL Mouth Rinse BID  . megestrol  400 mg Oral Daily  . mirtazapine  30 mg Oral QHS  . senna  1 tablet Oral QHS   Continuous Infusions: . ertapenem Stopped (08/07/18 2258)   PRN Meds:.ondansetron **OR** ondansetron (ZOFRAN) IV, sodium chloride flush Allergies  Allergen Reactions  . Oxycodone Other (See Comments)    confusion   Review of Systems denies pain, denies shortness of breath, denies constipation.  Complains of nausea.  Physical Exam  Thin, frail, intermittently confused and emotionally labile, lying in bed CV rrr with sys murmur resp no distress, CTA Abd protuberant but not tight Lower ext. Trace edema.  Vital Signs: BP (!) 114/55 (BP Location: Right Arm)   Pulse 81   Temp 97.9 F (36.6 C) (Oral)   Resp (!) 23   Ht '5\' 3"'$  (1.6 m)   Wt 46.8 kg   SpO2 99%   BMI 18.28 kg/m  Pain Scale: 0-10   Pain Score: 0-No pain   SpO2: SpO2: 99 % O2 Device:SpO2: 99 % O2 Flow Rate: .   IO: Intake/output summary:   Intake/Output Summary (Last 24 hours) at 08/08/2018 1453 Last data filed at 08/08/2018 1300 Gross per 24 hour  Intake 560 ml  Output -  Net 560 ml    LBM: Last BM Date: 08/07/18 Baseline Weight: Weight: 45.9 kg Most recent weight: Weight: 46.8 kg     Palliative Assessment/Data: 20%   Flowsheet Rows     Most Recent Value  Intake Tab  Referral Department  Hospitalist  Unit at Time of Referral  Intermediate Care Unit  Palliative Care Primary Diagnosis  Cancer  Date Notified  08/07/18  Palliative Care Type  Return patient Palliative Care  Reason for referral  Clarify Goals of Care  Date of Admission  08/05/18  # of days IP prior to Palliative referral  2  Clinical Assessment  Psychosocial & Spiritual Assessment  Palliative Care Outcomes      Time In: 1:00 Time Out: 3:00 Time Total: 120 min Greater than 50%  of this time was spent counseling and coordinating care related to the above  assessment and Parker.  Signed by: Florentina Jenny, PA-C Palliative Medicine Pager: [phone]  Please contact Palliative Medicine Team phone at [phone] for questions and concerns.  For individual provider: See Shea Evans

## 2018-08-08 NOTE — Progress Notes (Addendum)
3pm-Patient's son reports that patient will return home with him at discharge. CSW signing off.   1pm-CSW received consult regarding PT recommendation of SNF at discharge.  CSW and RNCM spoke with patient and spouse at bedside. Patient is refusing SNF. She states that she can do just as well at home rather than go to SNF. She reports agreement with setting up home health services. Patient's husband requests that we come back when patient's son, [name], arrives at [place] LCSW [phone]

## 2018-08-08 NOTE — Progress Notes (Signed)
[facility] for Infectious Disease  Date of Admission:  08/05/2018     Total days of antibiotics 4         ASSESSMENT/PLAN  Ms. 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 Awaiting swallow evaluation today. Plan to continue current dose of ertapenem and fluconazole for 6 weeks with starting date of 2/21 followed by lifelong suppression. She appears to be tolerating the current regimen with no adverse side effects at present. Family awaiting speaking with primary team.   1. Continue current dose of ertapenem and fluconazole. 2. Drain management per IR recommendations. 3. Swallow evaluation today with decreased appetite and may require C[place]rak pending goals of care.  4. Family to speak with primary team.    Principal Problem:   Saccular aneurysm Active Problems:   Primary peritoneal carcinomatosis ([place])   Severe protein-calorie malnutrition ([place])   COPD (chronic obstructive pulmonary disease) ([place])   Ovarian cancer ([place])   Colonic fistula   . amLODipine  5 mg Oral Daily  . aspirin  81 mg Oral Daily  . carvedilol  25 mg Oral BID WC  . clopidogrel  75 mg Oral Daily  . docusate sodium  100 mg Oral BID  . fluconazole  400 mg Oral Daily  . heparin  5,000 Units Subcutaneous Q8H  . hydrALAZINE  50 mg Oral Q8H  . lisinopril  40 mg Oral Daily  . magnesium oxide  400 mg Oral Daily  . mouth rinse  15 mL Mouth Rinse BID  . mirtazapine  30 mg Oral QHS  . senna  1 tablet Oral QHS    SUBJECTIVE:  Afebrile overnight without leukocytosis. No acute events overnight.   Allergies  Allergen Reactions  . Oxycodone Other (See Comments)    confusion     Review of Systems: Review of Systems  Constitutional: Negative for chills, fever and weight loss.  Respiratory: Negative for cough, shortness of breath and wheezing.   Cardiovascular:  Negative for chest pain and leg swelling.  Gastrointestinal: Positive for nausea. Negative for abdominal pain, constipation, diarrhea and vomiting.  Skin: Negative for rash.      OBJECTIVE: Vitals:   08/08/18 0200 08/08/18 0249 08/08/18 0300 08/08/18 0651  BP:  (!) 145/74  (!) 160/83  Pulse: 85 85 83 88  Resp: (!) 25 (!) 25 (!) 23 (!) 24  Temp:    98 F (36.7 C)  TempSrc:    Oral  SpO2: 99% 98% 98% 99%  Weight:      Height:       Body mass index is 18.28 kg/m.  Physical Exam Constitutional:      General: She is not in acute distress.    Appearance: She is well-developed. She is ill-appearing.     Comments: Thin, elderly female lying in bed with head of bed elevated.  Cardiovascular:     Rate and Rhythm: Normal rate and regular rhythm.     Heart sounds: Normal heart sounds.     Comments: PICC line dressing is clean and dry with site showing no evidence of infection.  Pulmonary:     Effort: Pulmonary effort is normal.     Breath sounds: Normal breath sounds.  Abdominal:     Comments: JP drain with tan colored drainage in bulb and to suction.   Skin:    General: Skin is warm and dry.  Neurological:  Mental Status: She is alert and oriented to person, place, and time.  Psychiatric:        Behavior: Behavior normal.        Thought Content: Thought content normal.        Judgment: Judgment normal.     Lab Results Lab Results  Component Value Date   WBC 7.6 08/08/2018   HGB 10.2 (L) 08/08/2018   HCT 32.1 (L) 08/08/2018   MCV 71.3 (L) 08/08/2018   PLT 330 08/08/2018    Lab Results  Component Value Date   CREATININE 0.78 08/08/2018   BUN 10 08/08/2018   NA 135 08/08/2018   K 3.3 (L) 08/08/2018   CL 96 (L) 08/08/2018   CO2 29 08/08/2018    Lab Results  Component Value Date   ALT 12 08/06/2018   AST 26 08/06/2018   ALKPHOS 79 08/06/2018   BILITOT 0.6 08/06/2018     Microbiology: No results found for this or any previous visit (from the past 240  hour(s)).   Terri Piedra, NP [facility] for [place] [facility] [phone] Pager  08/08/2018  9:52 AM

## 2018-08-09 DIAGNOSIS — I330 Acute and subacute infective endocarditis: Secondary | ICD-10-CM

## 2018-08-09 LAB — CBC WITH DIFFERENTIAL/PLATELET
Abs Immature Granulocytes: 0.05 10*3/uL (ref 0.00–0.07)
BASOS PCT: 0 %
Basophils Absolute: 0 10*3/uL (ref 0.0–0.1)
Eosinophils Absolute: 0.2 10*3/uL (ref 0.0–0.5)
Eosinophils Relative: 3 %
HEMATOCRIT: 32 % — AB (ref 36.0–46.0)
Hemoglobin: 10 g/dL — ABNORMAL LOW (ref 12.0–15.0)
Immature Granulocytes: 1 %
Lymphocytes Relative: 32 %
Lymphs Abs: 2.3 10*3/uL (ref 0.7–4.0)
MCH: 22.5 pg — ABNORMAL LOW (ref 26.0–34.0)
MCHC: 31.3 g/dL (ref 30.0–36.0)
MCV: 72.1 fL — ABNORMAL LOW (ref 80.0–100.0)
Monocytes Absolute: 0.8 10*3/uL (ref 0.1–1.0)
Monocytes Relative: 11 %
Neutro Abs: 3.9 10*3/uL (ref 1.7–7.7)
Neutrophils Relative %: 53 %
Platelets: 331 10*3/uL (ref 150–400)
RBC: 4.44 MIL/uL (ref 3.87–5.11)
RDW: 23.6 % — AB (ref 11.5–15.5)
WBC: 7.3 10*3/uL (ref 4.0–10.5)
nRBC: 0 % (ref 0.0–0.2)

## 2018-08-09 LAB — MAGNESIUM: Magnesium: 1.9 mg/dL (ref 1.7–2.4)

## 2018-08-09 LAB — COMPREHENSIVE METABOLIC PANEL
ALT: 20 U/L (ref 0–44)
AST: 29 U/L (ref 15–41)
Albumin: 2.6 g/dL — ABNORMAL LOW (ref 3.5–5.0)
Alkaline Phosphatase: 74 U/L (ref 38–126)
Anion gap: 9 (ref 5–15)
BILIRUBIN TOTAL: 0.4 mg/dL (ref 0.3–1.2)
BUN: 20 mg/dL (ref 8–23)
CO2: 29 mmol/L (ref 22–32)
Calcium: 9.4 mg/dL (ref 8.9–10.3)
Chloride: 97 mmol/L — ABNORMAL LOW (ref 98–111)
Creatinine, Ser: 0.97 mg/dL (ref 0.44–1.00)
GFR calc Af Amer: >60 mL/min (ref >60)
GFR calc non Af Amer: 58 mL/min — ABNORMAL LOW (ref >60)
Glucose, Bld: 98 mg/dL (ref 70–99)
POTASSIUM: 4 mmol/L (ref 3.5–5.1)
Sodium: 135 mmol/L (ref 135–145)
Total Protein: 7.6 g/dL (ref 6.5–8.1)

## 2018-08-09 NOTE — Progress Notes (Signed)
[facility] for Infectious Disease  Date of Admission:  08/05/2018             ASSESSMENT/PLAN  Ms. 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 She appears improved today and is awaiting swallow evaluation and possible discharge to home. Goals of care established with palliative care team to continue all treatments at this time. ID plan for antibiotic therapy with ertapenem and the fluconazole until 4/3 and will change to oral suppressive antibiotics at that time.  1. Continue current dose of ertapenem and fluconazole. 2. Drain management per IR recommendations.  3. Follow up in ID office in 4 weeks.   Diagnosis: MSSA mycotic aneurysm and polymicrobial intraabdominal abscess with colonic fistula.  Culture Result: Previously MSSA  Allergies  Allergen Reactions  . Oxycodone Other (See Comments)    confusion    OPAT Orders Discharge antibiotics: Ertapenem / Fluconzaole Per pharmacy protocol   Duration: 6 weeks End Date: 09/09/2018  [facility] Care Per Protocol:  Labs weekly while on IV antibiotics: _X_ CBC with differential __ BMP _X_ CMP __ CRP __ ESR __ Vancomycin trough __ CK  __ Please pull PIC at completion of IV antibiotics _X_ Please leave PIC in place until doctor has seen patient or been notified  Fax weekly labs to [phone]  Clinic Follow Up Appt:  09/06/18 at 10:30am with Dr. Linus Salmons  Principal Problem:   Saccular aneurysm Active Problems:   Primary peritoneal carcinomatosis ([place])   Severe protein-calorie malnutrition ([place])   COPD (chronic obstructive pulmonary disease) ([place])   Ovarian cancer ([place])   Colonic fistula   Palliative care encounter   . amLODipine  5 mg Oral Daily  . aspirin  81 mg Oral Daily  . carvedilol  25 mg Oral BID WC  . clopidogrel  75 mg Oral Daily  . docusate sodium  100 mg Oral  BID  . feeding supplement (ENSURE ENLIVE)  237 mL Oral BID BM  . fluconazole  400 mg Oral Daily  . heparin  5,000 Units Subcutaneous Q8H  . hydrALAZINE  50 mg Oral Q8H  . lisinopril  40 mg Oral Daily  . magnesium oxide  400 mg Oral Daily  . mouth rinse  15 mL Mouth Rinse BID  . megestrol  400 mg Oral Daily  . mirtazapine  30 mg Oral QHS  . senna  1 tablet Oral QHS    SUBJECTIVE:  Afebrile overnight with no acute events. Feeling a little better this morning and awaiting swallowing evaluation.   Allergies  Allergen Reactions  . Oxycodone Other (See Comments)    confusion     Review of Systems: Review of Systems  Constitutional: Negative for chills, fever and weight loss.  Respiratory: Negative for cough, shortness of breath and wheezing.   Cardiovascular: Negative for chest pain and leg swelling.  Gastrointestinal: Negative for abdominal pain, constipation, diarrhea, nausea and vomiting.  Skin: Negative for rash.      OBJECTIVE: Vitals:   08/08/18 1425 08/08/18 2137 08/09/18 0444 08/09/18 0500  BP: (!) 114/55 (!) 97/58 (!) 125/58   Pulse: 81 77 75   Resp: (!) '23 15 15   '$ Temp: 97.9 F (36.6 C) 97.9 F (36.6 C) 98.4 F (36.9 C)   TempSrc: Oral Oral Oral   SpO2: 99% 98% 98%   Weight:    44.2 kg  Height:  Body mass index is 17.26 kg/m.  Physical Exam Constitutional:      General: She is not in acute distress.    Appearance: She is well-developed and underweight. She is ill-appearing.     Comments: Elderly female lying in bed with head of bed elevated; pleasant.   Cardiovascular:     Rate and Rhythm: Normal rate and regular rhythm.     Heart sounds: Normal heart sounds.     Comments: PICC line in left arm appears with dressing that is clean and dry with no evidence of infection.  Pulmonary:     Effort: Pulmonary effort is normal.     Breath sounds: Normal breath sounds.  Abdominal:     General: Bowel sounds are normal. There is no distension.      Tenderness: There is no guarding or rebound.     Comments: JP drain to suction with tan drainage.   Skin:    General: Skin is warm and dry.  Neurological:     Mental Status: She is alert.  Psychiatric:        Mood and Affect: Mood normal.     Lab Results Lab Results  Component Value Date   WBC 7.3 08/09/2018   HGB 10.0 (L) 08/09/2018   HCT 32.0 (L) 08/09/2018   MCV 72.1 (L) 08/09/2018   PLT 331 08/09/2018    Lab Results  Component Value Date   CREATININE 0.97 08/09/2018   BUN 20 08/09/2018   NA 135 08/09/2018   K 4.0 08/09/2018   CL 97 (L) 08/09/2018   CO2 29 08/09/2018    Lab Results  Component Value Date   ALT 20 08/09/2018   AST 29 08/09/2018   ALKPHOS 74 08/09/2018   BILITOT 0.4 08/09/2018     Microbiology: No results found for this or any previous visit (from the past 240 hour(s)).   Terri Piedra, NP [facility] for [place] [facility] [phone] Pager  08/09/2018  10:12 AM

## 2018-08-09 NOTE — Progress Notes (Signed)
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  She underwent pelvic drain placement at [facility] and was placed on ertapenem and Diflucan per ID recommendations for 6 weeks.  She was transferred back to [facility] on 08/05/2018    PT Comments    Pt presented in bed with family member present. Pt agreeable to attempt transfer and gait training. Upon standing with RW pt displayed hard posterolateral lean to the left and required max A to come to stand and to correct balance. Pt tolerated standing for ~2 minutes and attempted to take steps but could not move BLE feet with max cues. Pt sat back on EOB and stedy lift was brought back in to attempt standing once more. Pt required less assistance (mod A) to stand and remain erect. Pt required max cues to extend hips fully. Pt tolerated 60 seconds of standing 3 separate times before stating she was giving out and was replaced back in bed. Pt stated she did not want to sit in chair, but education was provided on the benefit of changing positions and sitting up vs laying for longer periods. Pt d/c plan to SNF is appropriate at this time based on current progress. Plan to progress pt transfers and standing, with attempt for gait training when pt is ready.   Follow Up Recommendations  SNF;Supervision/Assistance - 24 hour     Equipment Recommendations       Recommendations for Other  Services       Precautions / Restrictions Precautions Precautions: Fall Restrictions Weight Bearing Restrictions: No    Mobility  Bed Mobility Overal bed mobility: Needs Assistance Bed Mobility: Supine to Sit;Sit to Supine     Supine to sit: Max assist Sit to supine: Max assist   General bed mobility comments: increased time and effort with max mulit modal cues to come to EOB, assist for LE movement to EOb and trunk elevation to upright and to rotate hips and position at EOb  Transfers Overall transfer level: Needs assistance Equipment used: Rolling walker (2 wheeled) Transfers: (x 2 from EOB and x 3 from stedy plates) Sit to Stand: Mod assist;Max assist         General transfer comment: Pt max A with RW and hard posterior lean. Pt mod A with stedy lift and able to better right herself upon standing using BUE. Pt required max vc/tc for hand placement at EOB and repeatedly ignored cues and attempted to pull on walker.  Ambulation/Gait         Gait velocity: unable today       Stairs             Wheelchair Mobility    Modified Rankin (Stroke Patients Only)       Balance Overall balance assessment: Needs assistance Sitting-balance support: Feet supported;Bilateral upper extremity supported Sitting balance-Leahy Scale: Fair     Standing balance support: Bilateral upper extremity supported;During functional activity Standing balance-Leahy Scale: Poor Standing balance comment: heavy reliance on UE support and assist  Cognition Arousal/Alertness: Lethargic Behavior During Therapy: Flat affect Overall Cognitive Status: No family/caregiver present to determine baseline cognitive functioning                                 General Comments: pt had difficulty following simple commands at times.       Exercises      General Comments        Pertinent Vitals/Pain Pain Assessment: No/denies  pain Pain Score: 6  Pain Location: r side of abd by ribs    Home Living                      Prior Function            PT Goals (current goals can now be found in the care plan section) Acute Rehab PT Goals Patient Stated Goal: to get back to walking PT Goal Formulation: With patient/family Potential to Achieve Goals: Fair    Frequency    Min 2X/week      PT Plan      Co-evaluation              AM-PAC PT "6 Clicks" Mobility   Outcome Measure  Help needed turning from your back to your side while in a flat bed without using bedrails?: A Little Help needed moving from lying on your back to sitting on the side of a flat bed without using bedrails?: A Little Help needed moving to and from a bed to a chair (including a wheelchair)?: A Lot Help needed standing up from a chair using your arms (e.g., wheelchair or bedside chair)?: A Lot Help needed to walk in hospital room?: A Lot Help needed climbing 3-5 steps with a railing? : A Lot 6 Click Score: 14    End of Session Equipment Utilized During Treatment: Gait belt Activity Tolerance: Patient limited by fatigue;Patient limited by lethargy;Patient limited by pain Patient left: with call bell/phone within reach;with bed alarm set;in bed;with family/visitor present Nurse Communication: Mobility status(need for catheter to be replaced) PT Visit Diagnosis: Unsteadiness on feet (R26.81);Difficulty in walking, not elsewhere classified (R26.2)     Time: 6945-0388 PT Time Calculation (min) (ACUTE ONLY): 32 min  Charges:  $Therapeutic Activity: 23-37 mins                     Maryelizabeth Kaufmann, SPTA   Maryelizabeth Kaufmann 08/09/2018, 5:23 PM

## 2018-08-09 NOTE — Progress Notes (Signed)
Patient ID: Jo Parker, female   DOB: [birth year], 73 y.o.   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  She underwent pelvic drain manipulation by IR at [facility] and was placed on ertapenem and Diflucan per ID recommendations for 6 weeks.  She was transferred back to [facility] on 08/05/2018.  Assessment & Plan:   Principal Problem:   Saccular aneurysm Active Problems:   Primary peritoneal carcinomatosis ([place])   Severe protein-calorie malnutrition ([name])   COPD (chronic obstructive pulmonary disease) ([name])   Ovarian cancer ([place])   Colonic fistula   Palliative care encounter  Saccular abdominal aortic aneurysm status post two-vessel FEVAR with celiac and SMA stenting on 07/25/2018 by vascular surgery at [facility] -Outpatient follow-up with vascular surgeon at [facility] Dr. Sammuel Hines  Pelvic abscess with colonic fistula -After vascular intervention, course was complicated by worsening size of pelvic abscess with colonic fistula which required IR manipulation of the drain.  Patient currently has a drain.  ID at [facility] recommended Colbert Ewing and Diflucan for 6 weeks.   Continue same regimen for now -Monitor labs.  Currently not spiking temperatures.  ID following -Consulted general surgery who recommended to consult patient's regular gynecologist for the same.  Spoke to Dr. Brewster/gynecologist on phone on  08/08/2018 who stated that she would arrange for outpatient follow-up with Dr. Samule Parker surgery.  - IR evaluation appreciated.  IR drain is currently functioning and patient will need repeat sinogram in 2 weeks.  Hypertension -Blood pressure stable.  Continue amlodipine and hydralazine  Anorexia -Marinol has caused intermittent drowsiness.  After discussion with patient's son/Jo Parker at bedside on 08/08/2018, Marinol has been discontinued.  Megace has been started. -Family is hopeful that patient appetite will start improving and they would be able to avoid core track versus feeding tube.  Question of dysphagia -SLP evaluation is pending.  COPD  -no exacerbation.  Continue monitoring  Moderate protein calorie malnutrition -Follow nutrition recommendations.  Chronic kidney disease stage III -Patient had some acute kidney injury during her hospitalization at [facility], which has resolved.  Currently creatinine stable.  Monitor  Hypomagnesemia  -Improved.  Hypokalemia -Improved.  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  Monitor  History of stage IV ovarian cancer with peritoneal carcinomatosis status post neoadjuvant chemotherapy and salpingo-oophorectomy -Follow-up with gynecology as an outpatient.  Generalized conditioning -PT recommends SNF.  Patient and family members are refusing SNF.  Will need home health on discharge  DVT prophylaxis: Heparin Code Status: Full Family Communication: Spoke to husband at bedside.  Spoke to Jo Parker/son at bedside on 08/08/2018. Disposition Plan: Home with home health once oral intake improves.  Consultants: ID/IR.  Spoke to Dr. Brewster/gynecology on phone on 08/08/2018.  Procedures: None.  Recent two-vessel FEVAR with celiac and SMA stenting on 07/25/2018 by vascular surgery at [facility].  Pelvic abscess drain manipulated at [facility].  Antimicrobials:  Ertapenem and Diflucan started on  07/29/2018 at [facility] for total of  6 weeks.  Subjective: Patient seen and examined at bedside.  She is awake, states that she ate dinner better yesterday.  Poor historian.  Denies any overnight fever, nausea or vomiting.  Husband at bedside. Objective: Vitals:   08/08/18 1425 08/08/18 2137 08/09/18 0444 08/09/18 0500  BP: (!) 114/55 (!) 97/58 (!) 125/58   Pulse: 81 77 75   Resp: (!) 23 15 15    Temp: 97.9 F (36.6 C) 97.9 F (36.6 C) 98.4 F (36.9 C)   TempSrc: Oral Oral Oral   SpO2: 99% 98% 98%   Weight:    44.2 kg  Height:        Intake/Output Summary (Last 24 hours) at 08/09/2018 0944 Last data filed at 08/09/2018 0529 Gross per 24 hour  Intake 303 ml  Output 25 ml  Net 278 ml   Filed Weights   08/06/18 0645 08/07/18 0500 08/09/18 0500  Weight: 45.9 kg 46.8 kg 44.2 kg    Examination:  General exam: Appears calm and comfortable.  Looks chronically ill.  No acute distress.  Poor historian  respiratory system: Bilateral decreased breath sounds at bases, some scattered crackles.   Cardiovascular system: Rate controlled, S1-S2 heard Gastrointestinal system: Abdomen is nondistended, soft and nontender.  Lower quadrant drain present. normal bowel sounds heard. Extremities: No cyanosis,  edema      Data Reviewed: I have personally reviewed following labs and imaging studies  CBC: Recent Labs  Lab 08/05/18 2114 08/06/18 0323 08/07/18 0326 08/08/18 0345 08/09/18 0435  WBC 10.8* 9.5 8.1 7.6 7.3  NEUTROABS  --   --  5.3 4.5 3.9  HGB 9.8* 9.5* 10.1* 10.2* 10.0*  HCT 30.3* 30.2* 31.6* 32.1* 32.0*  MCV 71.8* 72.2* 71.5* 71.3* 72.1*  PLT 330 308 346 330 616   Basic Metabolic Panel: Recent Labs  Lab 08/05/18 2114 08/06/18 0323 08/07/18 0326 08/08/18 0345 08/09/18 0435  NA  --  133* 134* 135 135  K  --  2.8* 3.7 3.3* 4.0  CL  --  95* 96* 96* 97*  CO2  --  27 28 29 29   GLUCOSE  --  94 130* 100* 98  BUN  --  11 10 10 20   CREATININE 0.76 0.71 0.71 0.78 0.97  CALCIUM  --   9.0 9.0 9.0 9.4  MG  --   --  1.5* 1.9 1.9   GFR: Estimated Creatinine Clearance: 36 mL/min (by C-G formula based on SCr of 0.97 mg/dL). Liver Function Tests: Recent Labs  Lab 08/06/18 0323 08/09/18 0435  AST 26 29  ALT 12 20  ALKPHOS 79 74  BILITOT 0.6 0.4  PROT 6.9 7.6  ALBUMIN 2.4* 2.6*   No results for input(s): LIPASE, AMYLASE in the last 168 hours. No results for input(s): AMMONIA in the last 168 hours. Coagulation Profile: No results for input(s): INR, PROTIME in the last 168 hours. Cardiac Enzymes: No results for input(s): CKTOTAL, CKMB, CKMBINDEX, TROPONINI in the last 168 hours. BNP (last 3 results) No results for input(s): PROBNP in the last 8760 hours. HbA1C: No results for input(s): HGBA1C in the last 72 hours. CBG: No results for input(s): GLUCAP in the last 168 hours. Lipid Profile: No results for input(s): CHOL, HDL, LDLCALC, TRIG, CHOLHDL, LDLDIRECT in the last 72 hours. Thyroid Function Tests: No results for input(s): TSH, T4TOTAL, FREET4, T3FREE, THYROIDAB in the last 72 hours. Anemia Panel: No results for input(s): VITAMINB12, FOLATE, FERRITIN, TIBC, IRON, RETICCTPCT in the last 72 hours. Sepsis Labs: No results  for input(s): PROCALCITON, LATICACIDVEN in the last 168 hours.  No results found for this or any previous visit (from the past 240 hour(s)).       Radiology Studies: No results found.      Scheduled Meds: . amLODipine  5 mg Oral Daily  . aspirin  81 mg Oral Daily  . carvedilol  25 mg Oral BID WC  . clopidogrel  75 mg Oral Daily  . docusate sodium  100 mg Oral BID  . feeding supplement (ENSURE ENLIVE)  237 mL Oral BID BM  . fluconazole  400 mg Oral Daily  . heparin  5,000 Units Subcutaneous Q8H  . hydrALAZINE  50 mg Oral Q8H  . lisinopril  40 mg Oral Daily  . magnesium oxide  400 mg Oral Daily  . mouth rinse  15 mL Mouth Rinse BID  . megestrol  400 mg Oral Daily  . mirtazapine  30 mg Oral QHS  . senna  1 tablet Oral QHS    Continuous Infusions: . ertapenem 1,000 mg (08/08/18 2217)     LOS: 4 days        Aline August, MD [facility] 08/09/2018, 9:44 AM

## 2018-08-09 NOTE — Progress Notes (Signed)
PHARMACY CONSULT NOTE FOR:  OUTPATIENT  PARENTERAL ANTIBIOTIC THERAPY (OPAT)  Indication: MSSA mycotic aneurysm and polymicrobial intraabdominal abscess with colonic fistula Regimen: Ertapenem 1 gm every 24 hours + Fluconazole 400 mg PO Q 24 hours End date: 09/09/2018  IV antibiotic discharge orders are pended. To discharging provider:  please sign these orders via discharge navigator,  Select New Orders & click on the button choice - Manage This Unsigned Work.     Thank you for allowing pharmacy to be a part of this patient's care.  Jimmy Footman, PharmD, BCPS, BCIDP Infectious Diseases Clinical Pharmacist Phone: [phone] 08/09/2018, 1:35 PM

## 2018-08-09 NOTE — Evaluation (Signed)
Clinical/Bedside Swallow Evaluation Patient Details  Name: Jo Parker MRN: [id number] Date of Birth: March 08, 1946  Today's Date: 08/09/2018 Time: SLP Start Time (ACUTE ONLY): 10 SLP Stop Time (ACUTE ONLY): 1556 SLP Time Calculation (min) (ACUTE ONLY): 33 min  Past Medical History:  Past Medical History:  Diagnosis Date  . Acute on chronic respiratory failure with hypoxia ([place]) 03/07/2018  . Anxiety and depression   . Aortic aneurysm ([place])    see ov note 03/2017   . Arthritis   . Aspiration pneumonia ([place])   . Carcinomatosis ([place]) 10/2017  . CHF (congestive heart failure) ([name])    diastolic dysfunction  . COPD (chronic obstructive pulmonary disease) ([place]) 10/23/2017  . Depression   . Family history of breast cancer   . Family history of prostate cancer   . Family history of uterine cancer   . History of diverticulitis   . HLD (hyperlipidemia) 10/23/2017  . Hypertension   . Mitral valve prolapse   . Pelvic abscess in female   . Sleep apnea    cpap machine mask and tubing in [place] per patient.  Patient does not have mask or tubing to bring on DOS of 01/27/2018.   Marland Kitchen Uterine cancer ([place])    had partial hysterectomy at 27   Past Surgical History:  Past Surgical History:  Procedure Laterality Date  . ABDOMINAL HYSTERECTOMY    . cardiac monitor implant    . CEREBRAL ANEURYSM REPAIR    . CERVICAL SPINE SURGERY     bone spurs  . DEBULKING N/A 01/27/2018   Procedure: RADICAL DEBULKING;  Surgeon: [name], MD;  Location: [facility];  Service: Gynecology;  Laterality: N/A;  . IR CATHETER TUBE CHANGE  06/13/2018  . IR FLUORO GUIDE PORT INSERTION RIGHT  10/14/2017  . IR PARACENTESIS  10/14/2017  . IR RADIOLOGIST EVAL & MGMT  04/26/2018  . IR RADIOLOGIST EVAL & MGMT  05/10/2018  . IR RADIOLOGIST EVAL & MGMT  05/25/2018  . IR RADIOLOGIST EVAL & MGMT  07/20/2018  . IR REMOVAL TUN ACCESS W/ PORT W/O FL MOD SED  07/02/2018  . IR US GUIDE VASC ACCESS RIGHT  10/14/2017  . LAPAROTOMY N/A  01/27/2018   Procedure: EXPLORATORY LAPAROTOMY WITH REMOVAL OF BILATERAL TUBES AND OVARIES;  Surgeon: [name], MD;  Location: [facility];  Service: Gynecology;  Laterality: N/A;  . LAPAROTOMY N/A 01/31/2018   Procedure: EXPLORATORY LAPAROTOMY, ABDOMINAL WASHOUT;  Surgeon: Isabel Caprice, MD;  Location: [facility];  Service: Gynecology;  Laterality: N/A;  . OMENTECTOMY N/A 01/27/2018   Procedure: OMENTECTOMY;  Surgeon: [name], MD;  Location: [facility];  Service: Gynecology;  Laterality: N/A;  . PARTIAL HYSTERECTOMY  1971   "precancer"  . 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  She underwent pelvic drain manipulation by IR at [facility] and was placed on ertapenem and Diflucan per ID recommendations for 6 weeks.  She was transferred back to [facility] on 08/05/2018. Based on  Palliative Care's note, the pt's intake has been poor and she is a "very poor candidate for Coretrak or PEG" due to her bouts of delirium and peritoneal carcinomatosis.    Assessment / Plan / Recommendation Clinical Impression  Pt participated in bedside swallow evaluation with her husband present. Pt was alert and cooperative throughout the session and she presented as a reliable historian. She reported odynophagia since October of 2019 which she rated as 6/10 but stated is variable  and that she "sometimes doesn't think about it". She indicated that she has been consuming a regular texture diet with thin liquids prior to admission and she denied consistent signs of aspiration with solids or liquids but indicated that she occasionally coughs during meals. Pt expressed that she has not been eating much due to poor appetite. She tolerated all solids and liquids without overt s/sx of aspiration. A bolus hold and effortful swallows were inconsistently demonstrated but pt denied the sensation of pharyngeal residue. The possibility of completing a repeat modified barium swallow study to further assess swallow function was discussed; however, pt indicated that she would like to see how she tolerates an upgraded diet first. An upgrade to regular texture solids and thin liquids is recommended at this time and SLP will follow to ensure tolerance of the upgraded diet as well as the need for instrumental assessment. Pt, nursing, her husband, and her son, [name] (via phone) have all been educated regarding the results of the assessment and recommendations.  SLP Visit Diagnosis: Dysphagia, unspecified (R13.10)    Aspiration Risk  No limitations    Diet Recommendation Regular;Thin liquid   Liquid Administration via: Cup;Straw Medication Administration: Whole meds with puree Supervision: Patient able to self feed Compensations: Slow rate;Small sips/bites Postural Changes: Seated upright at 90 degrees;Remain upright for at least 30 minutes after po intake    Other  Recommendations Oral Care Recommendations: Oral care BID   Follow up Recommendations None      Frequency and Duration min 2x/week  2 weeks       Prognosis Prognosis for Safe Diet Advancement: Good      Swallow Study   General Date of Onset: 08/08/18 HPI: Pt 74 year old female with history of 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  She underwent pelvic drain manipulation by IR at [facility] and was placed on ertapenem and Diflucan per ID recommendations for 6 weeks.  She was transferred back to [facility] on 08/05/2018. Based on Palliative Care's note, the pt's intake has been poor and she is a "very poor candidate for Coretrak or PEG" due to her bouts of delirium and peritoneal carcinomatosis.  Type of Study: Bedside Swallow Evaluation Previous Swallow Assessment: Pt was followed by speech pathology in September and October Diet Prior to this Study: Dysphagia 3 (soft);Thin liquids Temperature Spikes Noted: No Respiratory Status: Room air History of Recent Intubation: No Behavior/Cognition: Alert;Cooperative;Pleasant mood Oral Cavity Assessment: Within Functional Limits Oral Care Completed by SLP: Recent completion by staff Oral Cavity - Dentition: Adequate natural dentition Vision: Functional for self-feeding Self-Feeding Abilities: Able to feed self;Needs assist Patient Positioning: Upright in bed;Postural control adequate for testing Baseline Vocal Quality: Low vocal intensity  Volitional Swallow: Able to elicit    Oral/Motor/Sensory Function Overall Oral Motor/Sensory Function: Within functional limits   Ice Chips Ice chips: Within functional limits   Thin Liquid Thin Liquid: Impaired Presentation: Cup;Straw Oral Phase Functional Implications: Oral holding(With 1/7 boluses) Pharyngeal  Phase Impairments: Other (comments)(Effortful swallow inconsistently noted)    Nectar Thick Nectar Thick Liquid: Not tested   Honey Thick Honey Thick Liquid: Not tested   Puree Puree: Within functional  limits   Solid     Solid: Within functional limits Presentation: [place] I. Hardin Negus, [place], [place] Office number [phone] Pager [place] 08/09/2018,4:22 PM

## 2018-08-10 LAB — BASIC METABOLIC PANEL
Anion gap: 9 (ref 5–15)
BUN: 19 mg/dL (ref 8–23)
CO2: 27 mmol/L (ref 22–32)
Calcium: 9.3 mg/dL (ref 8.9–10.3)
Chloride: 100 mmol/L (ref 98–111)
Creatinine, Ser: 0.71 mg/dL (ref 0.44–1.00)
GFR calc Af Amer: >60 mL/min (ref >60)
GFR calc non Af Amer: >60 mL/min (ref >60)
Glucose, Bld: 81 mg/dL (ref 70–99)
Potassium: 3.5 mmol/L (ref 3.5–5.1)
Sodium: 136 mmol/L (ref 135–145)

## 2018-08-10 LAB — CBC WITH DIFFERENTIAL/PLATELET
Band Neutrophils: 0 %
Basophils Absolute: 0.1 10*3/uL (ref 0.0–0.1)
Basophils Relative: 1 %
Blasts: 0 %
Eosinophils Absolute: 0.3 10*3/uL (ref 0.0–0.5)
Eosinophils Relative: 4 %
HCT: 29.7 % — ABNORMAL LOW (ref 36.0–46.0)
Hemoglobin: 9.4 g/dL — ABNORMAL LOW (ref 12.0–15.0)
Lymphocytes Relative: 36 %
Lymphs Abs: 2.3 10*3/uL (ref 0.7–4.0)
MCH: 22.8 pg — ABNORMAL LOW (ref 26.0–34.0)
MCHC: 31.6 g/dL (ref 30.0–36.0)
MCV: 72.1 fL — ABNORMAL LOW (ref 80.0–100.0)
Metamyelocytes Relative: 0 %
Monocytes Absolute: 0.6 10*3/uL (ref 0.1–1.0)
Monocytes Relative: 10 %
Myelocytes: 0 %
Neutro Abs: 3 10*3/uL (ref 1.7–7.7)
Neutrophils Relative %: 49 %
Other: 0 %
Platelets: 280 10*3/uL (ref 150–400)
Promyelocytes Relative: 0 %
RBC: 4.12 MIL/uL (ref 3.87–5.11)
RDW: 23.3 % — ABNORMAL HIGH (ref 11.5–15.5)
WBC: 6.3 10*3/uL (ref 4.0–10.5)
nRBC: 0 % (ref 0.0–0.2)
nRBC: 0 /100 WBC

## 2018-08-10 LAB — MAGNESIUM: Magnesium: 1.7 mg/dL (ref 1.7–2.4)

## 2018-08-10 MED ORDER — TRAMADOL HCL 50 MG PO TABS: 50.0000 mg | ORAL_TABLET | Freq: Four times a day (QID) | ORAL | 0 refills | Status: DC | PRN

## 2018-08-10 MED ORDER — SODIUM CHLORIDE 0.9 % IV SOLN
1.0000 g | INTRAVENOUS | Status: DC
  Administered 2018-08-10: 1000 mg via INTRAVENOUS
  Filled 2018-08-10: qty 1

## 2018-08-10 MED ORDER — MEGESTROL ACETATE 400 MG/10ML PO SUSP: 400.0000 mg | Freq: Every day | ORAL | 0 refills | Status: DC

## 2018-08-10 MED ORDER — SODIUM CHLORIDE 0.9 % IV SOLN
1.0000 g | INTRAVENOUS | Status: DC
  Filled 2018-08-10: qty 1

## 2018-08-10 MED ORDER — HEPARIN SOD (PORK) LOCK FLUSH 100 UNIT/ML IV SOLN: 250.0000 [IU] | INTRAVENOUS | Status: DC | PRN

## 2018-08-10 MED ORDER — ERTAPENEM IV (FOR PTA / DISCHARGE USE ONLY): 1.0000 g | INTRAVENOUS | 0 refills | Status: DC

## 2018-08-10 MED ORDER — FLUCONAZOLE 200 MG PO TABS: 400.0000 mg | ORAL_TABLET | Freq: Every day | ORAL | 0 refills | Status: DC

## 2018-08-10 MED ORDER — ENSURE ENLIVE PO LIQD: 237.0000 mL | Freq: Two times a day (BID) | ORAL | 0 refills | Status: DC

## 2018-08-10 NOTE — Progress Notes (Signed)
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  She underwent pelvic drain placement at [facility] and was placed on ertapenem and Diflucan per ID recommendations for 6 weeks.  She was transferred back to [facility] on 08/05/2018    PT Comments    Pt presented in chair with sister and husband present. Pt agreed to perform transfer training and light therex. Pt family educated on transfer training with gait belt. Gait belt adjusted to fit pt for easier application at home. PTA educated and demonstrated stand pivot transfers for family and then husband demonstrated stand pivot with pt. Pt husband qhwn performing transfer attempted to take pt the wrong way and required correction to complete transfer safely. Family will require work with HHPT to improve transfer safety. Pt tolerated light therex and continues to have difficulty initiating movement in her LLE. Pt family educated on exercises and provided HEP. Based on current progress d/c plan to SNF is still appropriate and family/pt is refusing at this time stating that they will be there to assist her 24 hours a day. Pt will require w/c and cushion at home and HHPT services will be needed. Plan to progress pt left up to [facility] agency at this time.    Follow Up Recommendations  SNF;Supervision/Assistance - 24 hour     Equipment Recommendations       Recommendations for Other  Services       Precautions / Restrictions Precautions Precautions: Fall Restrictions Weight Bearing Restrictions: No    Mobility  Bed Mobility Overal bed mobility: Needs Assistance Bed Mobility: Supine to Sit     Supine to sit: Max assist     General bed mobility comments: multimodal cues. extra time and effort. assist to powerup trunk, advance BLE and pivot hips  Transfers Overall transfer level: Needs assistance Equipment used: (gait belt) Transfers: Stand Pivot Transfers Sit to Stand: Mod assist;+2 physical assistance;Max assist Stand pivot transfers: Max assist Squat pivot transfers: Max assist;Total assist;+2 physical assistance     General transfer comment: Initially +2 mod-max A to SPT to [facility]. Pt fatigued and difficulty sequencing during BSC>recliner. Needed max-total A for squat pivot into recliner.   Ambulation/Gait         Gait velocity: unable today       Stairs             Wheelchair Mobility    Modified Rankin (Stroke Patients Only)       Balance Overall balance assessment: Needs assistance Sitting-balance support: Feet supported;Bilateral upper extremity supported Sitting balance-Leahy Scale: Fair     Standing balance support: Bilateral upper extremity supported;During functional activity Standing balance-Leahy Scale: Zero Standing balance comment: heavy reliance on UE support and assist. poor to zero standing balance                            Cognition Arousal/Alertness: Awake/alert;Lethargic Behavior During Therapy: Flat affect Overall Cognitive Status: Impaired/Different from baseline Area of Impairment: Memory;Attention;Following commands;Awareness;Safety/judgement;Problem solving;Orientation  Current Attention Level: Sustained Memory: Decreased recall of precautions;Decreased short-term memory Following Commands: Follows one step commands inconsistently;Follows one step commands with  increased time Safety/Judgement: Decreased awareness of deficits Awareness: Intellectual Problem Solving: Slow processing;Decreased initiation;Requires verbal cues;Requires tactile cues;Difficulty sequencing General Comments: pt had difficulty following simple commands at times.       Exercises General Exercises - Lower Extremity Ankle Circles/Pumps: AROM;10 reps;Both;Seated;AAROM Long Arc Quad: AROM;AAROM;10 reps;Seated;Both Hip ABduction/ADduction: AROM;AAROM;10 reps;Seated;Both Hip Flexion/Marching: AROM;10 reps;AAROM;Seated;Both    General Comments General comments (skin integrity, edema, etc.): Pt given gait belt for family to use.      Pertinent Vitals/Pain Pain Assessment: No/denies pain Faces Pain Scale: Hurts a little bit Pain Location: unspecified Pain Intervention(s): Monitored during session    Home Living                      Prior Function            PT Goals (current goals can now be found in the care plan section) Acute Rehab PT Goals Patient Stated Goal: to get home PT Goal Formulation: With patient/family Potential to Achieve Goals: Fair    Frequency    Min 2X/week      PT Plan      Co-evaluation              AM-PAC PT "6 Clicks" Mobility   Outcome Measure  Help needed turning from your back to your side while in a flat bed without using bedrails?: A Little Help needed moving from lying on your back to sitting on the side of a flat bed without using bedrails?: A Little Help needed moving to and from a bed to a chair (including a wheelchair)?: A Lot Help needed standing up from a chair using your arms (e.g., wheelchair or bedside chair)?: A Lot Help needed to walk in hospital room?: A Lot Help needed climbing 3-5 steps with a railing? : A Lot 6 Click Score: 14    End of Session Equipment Utilized During Treatment: Gait belt Activity Tolerance: Patient tolerated treatment well Patient left: in chair;with family/visitor  present;with call bell/phone within reach Nurse Communication: Mobility status PT Visit Diagnosis: Unsteadiness on feet (R26.81);Difficulty in walking, not elsewhere classified (R26.2)     Time: 6073-7106 PT Time Calculation (min) (ACUTE ONLY): 30 min  Charges:  $Therapeutic Exercise: 8-22 mins $Therapeutic Activity: 8-22 mins                     Maryelizabeth Kaufmann, SPTA   Maryelizabeth Kaufmann 08/10/2018, 3:36 PM

## 2018-08-10 NOTE — Care Management Note (Addendum)
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  Hx of uterine cancer/ s/p surgery , stage 4 ovarian cancer s/p chemotherapy and salpingo-oophorectomy, COPD, D-HF,  primary peritoneal carcinomatosis, pelvic abscess with bowel fistula. From home with son/family. Husband states wife recently d/c from [place] on Fri., 2/28 (aortic aneurysm).             [address] (Spouse) [name] [id number] [phone]      PCP: Olean Ree   08/10/2018 @ 1230  Referral made with [facility] for wheelchair. Wheelchair will be delivered to bedside prior to d/c pending approval.   Action/Plan: Transition to home with home health services... patient will need LT IV ABX therapy @ d/c.  Liaison with [facility] to see pt prior to d/c today, NCM made bedside nurse aware.  Pt/family declined SNF placement. Pt is to receive today's dose of IV ABX at hospital prior to d/c. [place] start of care to begin 08/11/2018. Anthony(son) to provide transportation to home.  Expected Discharge Date:  08/10/18               Expected Discharge Plan:  [place]  In-House Referral:  Clinical Social Work  Discharge planning Services  CM Consult  Post Acute Care Choice:  Resumption of Svcs/PTA Provider, Home Health Choice offered to:  Adult Children, Patient  DME Arranged:  IV pump/equipment(IV ABX THERAPY) DME Agency:  Other - Comment([facility])  HH Arranged:  RN, PT, Nurse's Aide, OT([facility]) [place]:  Other - See comment([facility])  Status of Service:  Completed, signed off  If discussed at [place] of Stay Meetings, dates discussed:    Additional Comments:  Sharin Mons, RN 08/10/2018, 12:08 PM

## 2018-08-10 NOTE — Progress Notes (Signed)
  Speech Language Pathology Treatment: Dysphagia  Patient Details Name: Jo Parker MRN: [id number] DOB: 12-Nov-1945 Today's Date: 08/10/2018 Time: 0768-0881 SLP Time Calculation (min) (ACUTE ONLY): 15 min  Assessment / Plan / Recommendation Clinical Impression  Pt was seen for f/u after yesterday's clinical swallow assessment.  She is preparing for D/C home with her husband.  Pt known to SLP from September admission.  Lung sounds are diminished; she is afebrile.  She is amenable to eating/drinking but describes poor appetite and dysgeusia.  She demonstrates ongoing slowed mastication, oral holding prior to swallow, but there are no overt concerns for aspiration; she describes no difficulty. Recommend continuing a regular diet and thin liquids post-discharge to liberalize her food choices.  No SLP f/u is warranted.  Our service will sign off.   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  She underwent pelvic drain manipulation by IR at [facility] and was placed on ertapenem and Diflucan per ID recommendations for 6 weeks.  She was transferred back to [facility] on 08/05/2018. Based on Palliative Care's note, the pt's intake has been poor and she is a "very poor candidate for Coretrak or PEG" due to her bouts of delirium and peritoneal carcinomatosis.       SLP Plan  All goals met       Recommendations  Diet recommendations: Regular;Thin liquid Liquids provided via: Cup Medication Administration: Whole meds with liquid Supervision: Patient  able to self feed                Oral Care Recommendations: Oral care BID Follow up Recommendations: None SLP Visit Diagnosis: Dysphagia, unspecified (R13.10) Plan: All goals met       GO              Jo Parker L. [name], [place] CCC/SLP Acute Rehabilitation Services Office number [phone] Pager [phone]   Jo Parker 08/10/2018, 11:31 AM

## 2018-08-10 NOTE — Progress Notes (Signed)
IV team consult placed to flush and cap PICC prior to discharge @ 1917. Writer arrived to floor and patient not in room. Care team updated writer that patient had left hospital and its unclear whether line was flushed and capped prior to discharge by any other team member.

## 2018-08-10 NOTE — Progress Notes (Addendum)
    Durable Medical Equipment  (From admission, onward)         Start     Ordered   08/10/18 1426  For home use only DME Other see comment  Once    Comments:  stedy lift   08/10/18 1426   08/10/18 1424  For home use only DME standard manual wheelchair with seat cushion  Once    Comments:  Patient suffers from weakness which impairs their ability to perform daily activities like  in the home.  A rolling walker will not resolve  issue with performing activities of daily living. A wheelchair will allow patient to safely perform daily activities. Patient can safely propel the wheelchair in the home or has a caregiver who can provide assistance.  Accessories: elevating leg rests (ELRs), wheel locks, extensions and anti-tippers.   08/10/18 1426

## 2018-08-10 NOTE — Progress Notes (Signed)
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  She underwent pelvic drain placement at [facility] and was placed on ertapenem and Diflucan per ID recommendations for 6 weeks.  She was transferred back to [facility] on 08/05/2018   OT comments  Pt progressing slowly towards acute OT goals. Focus of session was toilet transfers and pericare. Pt with difficulty coming into trunk/hip flexion position during tranfers. This combined with fatigue and impaired cognition is significantly impacting her level of assist with ADLs/transfers. Pt mod to total A +2 for transfers. Family present and included in education as pt has declined SNF recommendation and plans to go home.   Follow Up Recommendations  SNF Pt declining SNF, recommend maximizing [facility] services   Equipment Recommendations  Wheelchair (measurements OT);Wheelchair cushion (measurements OT)    Recommendations for Other Services      Precautions / Restrictions Precautions Precautions: Fall Restrictions Weight Bearing Restrictions: No       Mobility Bed Mobility Overal bed mobility: Needs Assistance Bed Mobility: Supine to Sit     Supine to sit: Max assist     General bed mobility comments: multimodal cues. extra time and effort. assist to powerup trunk, advance BLE and pivot hips  Transfers Overall transfer level: Needs assistance Equipment used:  Rolling walker (2 wheeled) Transfers: Sit to/from [employer] Transfers Sit to Stand: Mod assist;+2 physical assistance;Max assist Stand pivot transfers: +2 physical assistance;Mod assist Squat pivot transfers: Max assist;Total assist;+2 physical assistance     General transfer comment: Initially +2 mod-max A to SPT to [facility]. Pt fatigued and difficulty sequencing during BSC>recliner. Needed max-total A for squat pivot into recliner.     Balance Overall balance assessment: Needs assistance Sitting-balance support: Feet supported;Bilateral upper extremity supported Sitting balance-Leahy Scale: Fair     Standing balance support: Bilateral upper extremity supported;During functional activity Standing balance-Leahy Scale: Zero Standing balance comment: heavy reliance on UE support and assist. poor to zero standing balance                           ADL either performed or assessed with clinical judgement   ADL Overall ADL's : Needs assistance/impaired                         Toilet Transfer: Maximal assistance;Squat-pivot;+2 for physical assistance;Total assistance Toilet Transfer Details (indicate cue type and reason): max A to stand-pivot EOB>BSC, total A to squat -pivot from [facility] to recliner.  Toileting- Clothing Manipulation and Hygiene: Total assistance;+2 for physical assistance Toileting - Clothing Manipulation Details (indicate cue type and reason): did not come to full standing position with +2 assist       General ADL Comments: Pt completed bed mobility, toilet transfer, and pericare. Pt very unsteady during transfers. +2 assist for any OOB for physical assist and safeyty.     Vision       Perception     Praxis      Cognition Arousal/Alertness:  Awake/alert;Lethargic Behavior During Therapy: Flat affect Overall Cognitive Status: Impaired/Different from baseline Area of Impairment: Memory;Attention;Following  commands;Awareness;Safety/judgement;Problem solving;Orientation                   Current Attention Level: Sustained Memory: Decreased recall of precautions;Decreased short-term memory Following Commands: Follows one step commands inconsistently;Follows one step commands with increased time Safety/Judgement: Decreased awareness of deficits Awareness: Intellectual Problem Solving: Slow processing;Decreased initiation;Requires verbal cues;Requires tactile cues;Difficulty sequencing General Comments: pt had difficulty following simple commands at times.         Exercises     Shoulder Instructions       General Comments Pt given gait belt for family to use.    Pertinent Vitals/ Pain       Pain Assessment: Faces Faces Pain Scale: Hurts a little bit Pain Location: unspecified Pain Intervention(s): Monitored during session  Home Living                                          Prior Functioning/Environment              Frequency  Min 2X/week        Progress Toward Goals  OT Goals(current goals can now be found in the care plan section)  Progress towards OT goals: Progressing toward goals  Acute Rehab OT Goals Patient Stated Goal: to get back to walking OT Goal Formulation: With patient Time For Goal Achievement: 08/21/18 Potential to Achieve Goals: Good ADL Goals Pt Will Perform Grooming: with modified independence Pt Will Perform Upper Body Bathing: with set-up Pt Will Perform Lower Body Bathing: with set-up;with adaptive equipment Pt Will Perform Upper Body Dressing: with set-up Pt Will Perform Lower Body Dressing: with set-up;with adaptive equipment Pt Will Transfer to Toilet: with supervision Pt Will Perform Toileting - Clothing Manipulation and hygiene: with supervision  Plan Discharge plan remains appropriate    Co-evaluation                 AM-PAC OT "6 Clicks" Daily Activity     Outcome Measure   Help from another  person eating meals?: A Little Help from another person taking care of personal grooming?: A Lot Help from another person toileting, which includes using toliet, bedpan, or urinal?: A Lot Help from another person bathing (including washing, rinsing, drying)?: Total Help from another person to put on and taking off regular upper body clothing?: A Lot Help from another person to put on and taking off regular lower body clothing?: Total 6 Click Score: 11    End of Session Equipment Utilized During Treatment: Gait belt;Rolling walker  OT Visit Diagnosis: Unsteadiness on feet (R26.81);Muscle weakness (generalized) (M62.81)   Activity Tolerance Patient limited by fatigue;Patient limited by lethargy;Patient limited by pain   Patient Left in chair;with call bell/phone within reach;with family/visitor present   Nurse Communication          Time: 0962-8366 OT Time Calculation (min): 28 min  Charges: OT General Charges $OT Visit: 1 Visit OT Treatments $Self Care/Home Management : 23-37 mins  Tyrone Schimke, OT Acute Rehabilitation Services Pager: [phone] Office: [phone]    Hortencia Pilar 08/10/2018, 2:36 PM

## 2018-08-10 NOTE — Progress Notes (Signed)
Son given prescription and instruction for discharge. Pt listening at this time. No c/o of pain and VS WNL and sent hone with son in W/C at this time

## 2018-08-10 NOTE — Discharge Summary (Addendum)
PATIENT DETAILS Name: Jo Parker Age: 73 y.o. Sex: female Date of Birth: 1945-06-13 MRN: [id number]. Admitting Physician: Elwyn Reach, MD [id number], Ernst Spell, MD  Admit Date: 08/05/2018 Discharge date: 08/10/2018  Recommendations for Outpatient Follow-up:  1. Follow up with PCP in 1-2 weeks 2. Please obtain BMP/CBC in one week 3. Please ensure follow-up with Dr. Si Raider oncology, [facility] vascular surgery, IR drain clinic, ID clinic. 4. 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  Subsequently transferred back to [facility] by ID with recommendations for 6 weeks of IV Invanz/fluconazole for 6 weeks.  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 Sammuel Hines  Pelvic abscess with colonic fistula: Occurred status post vascular intervention-underwent IR drain placement.  Evaluated by ID with recommendations to continue with Invanz and Diflucan for a total of 6 weeks.  Patient to follow-up with IR for repeat sinogram in the outpatient setting.  Hypertension: Blood pressure  control-continue lisinopril, Coreg, amlodipine and hydralazine  Anorexia: Previously on Marinol but this was discontinued as the patient was getting drowsy, has started on Megace.  Dysphagia: No dysphagia symptoms this morning-evaluated by SLP-recommendations are to continue with a regular diet with thin liquids.  COPD: Stable-continue bronchodilators.  Moderate protein calorie malnutrition: Continue supplements  CKD stage III: Creatinine stable-follow.  Anemia: Likely multifactorial-secondary to acute illness, and anemia of chronic kidney disease.  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 s/p neoadjuvant chemotherapy and salpingo-oophorectomy: Follow with GYN oncology in the outpatient setting  Deconditioning/debility: Refusing SNF-we will go home with [facility]  Procedures/Studies: 07/25/2018 >> Recent two-vessel FEVAR with celiac and SMA stenting by vascular surgery at [facility]   Discharge Diagnoses:  Principal Problem:   Saccular aneurysm Active Problems:   Primary peritoneal carcinomatosis ([place])   Severe protein-calorie malnutrition ([place])   COPD (chronic obstructive pulmonary disease) ([place])   Ovarian cancer [facility])   Colonic fistula   Palliative care encounter   Discharge Instructions:  Activity:  As tolerated with Full fall precautions use walker/cane & assistance as needed  Discharge Instructions    Diet - low sodium heart healthy   Complete by:  As directed    Regular;Thin liquid   Liquid Administration via: Cup;Straw Medication Administration: Whole meds with puree Supervision: Patient able to self feed Compensations: Slow rate;Small sips/bites Postural Changes: Seated upright at 90 degrees;Remain upright for at least 30 minutes after po intake   Discharge instructions   Complete by:  As directed    Follow with Primary MD   [name], MD in 1 week  Follow with [facility] vascular surgery as scheduled  Follow with the IR drain clinic as  scheduled-they will call you for follow-up appointment  Follow with the ID clinic-as scheduled-they will call you for a follow-up appointment  Please get a complete blood count and chemistry panel checked by your Primary MD at your next visit, and again as instructed by your Primary MD.  Get Medicines reviewed and adjusted: Please take all your medications with you for your next visit with your Primary MD  Laboratory/radiological data: Please request your Primary MD to go over all hospital tests and procedure/radiological results at the follow up, please ask your Primary MD to get all Hospital records sent to his/her office.  In some cases, they will be blood work, cultures and biopsy results pending at the time of your discharge. Please request that your primary care M.D. follows up on these results.  Also Note the following: If you experience worsening of your admission symptoms, develop shortness of breath, life threatening emergency, suicidal or homicidal thoughts you must seek medical attention immediately by calling 911 or calling your MD immediately  if symptoms less severe.  You must read complete instructions/literature along with all the possible adverse reactions/side effects for all the Medicines you take and that have been prescribed to you. Take any new Medicines after you have completely understood and accpet all the possible adverse reactions/side effects.   Do not drive when taking Pain medications or sleeping medications (Benzodaizepines)  Do not take more than prescribed Pain, Sleep and Anxiety Medications. It is not advisable to combine anxiety,sleep and pain medications without talking with your primary care practitioner  Special Instructions: If you have smoked or chewed Tobacco  in the last 2 yrs please stop smoking, stop any regular Alcohol  and or any  Recreational drug use.  Wear Seat belts while driving.  Please note: You were cared for by a hospitalist during your hospital stay. Once you are discharged, your primary care physician will handle any further medical issues. Please note that NO REFILLS for any discharge medications will be authorized once you are discharged, as it is imperative that you return to your primary care physician (or establish a relationship with a primary care physician if you do not have one) for your post hospital discharge needs so that they can reassess your need for medications and monitor your lab values.   Discharge wound care:   Complete by:  As directed    Recommend flushing with 3 mL twice daily and return in 2 weeks for repeat sinogram.   Home infusion instructions [facility] May follow [place] Dosing Protocol; May administer Cathflo as needed to maintain patency of vascular access device.; Flushing of vascular access device: per [facility] Protocol: 0.9% NaCl pre/post medica...   Complete by:  As directed    Instructions:  May follow [place] Dosing Protocol   Instructions:  May administer Cathflo as needed to maintain patency of vascular access device.   Instructions:  Flushing of vascular access device: per [facility] Protocol: 0.9% NaCl pre/post medication administration and prn patency; Heparin 100 u/ml, 50m for implanted ports and Heparin 10u/ml, 566mfor all other central venous catheters.   Instructions:  May follow [facility] Anaphylaxis Protocol for First Dose Administration in the home: 0.9% NaCl at 25-50 ml/hr to maintain IV access for protocol meds. Epinephrine 0.3 ml IV/IM PRN and Benadryl 25-50 IV/IM PRN s/s of anaphylaxis.   Instructions:  [facility] Coordinator (RN) to assist per patient IV care needs in the home PRN.   Increase activity slowly  Complete by:  As directed      Allergies as of 08/10/2018      Reactions   Oxycodone Other (See Comments)   confusion        Medication List    STOP taking these medications   dronabinol 2.5 MG capsule Commonly known as:  MARINOL   INVANZ 1 g Solr injection Generic drug:  ertapenem Replaced by:  ertapenem  IVPB     TAKE these medications   amLODipine 5 MG tablet Commonly known as:  NORVASC Take 5 mg by mouth daily.   aspirin 81 MG chewable tablet Chew 81 mg by mouth daily.   carvedilol 25 MG tablet Commonly known as:  COREG Take 25 mg by mouth 2 (two) times daily.   clopidogrel 75 MG tablet Commonly known as:  PLAVIX Take 75 mg by mouth daily.   docusate sodium 100 MG capsule Commonly known as:  COLACE Take 100 mg by mouth 2 (two) times daily.   ertapenem  IVPB Commonly known as:  INVANZ Inject 1 g into the vein daily. Indication:  MSSA mycotic aneurysm + polymicrobial intraabdominal abscess Last Day of Therapy:  09/09/2018 Labs - Once weekly:  CBC/D and BMP, Labs - Every other week:  ESR and CRP Replaces:  INVANZ 1 g Solr injection   feeding supplement (ENSURE ENLIVE) Liqd Take 237 mLs by mouth 2 (two) times daily between meals.   fluconazole 200 MG tablet Commonly known as:  DIFLUCAN Take 2 tablets (400 mg total) by mouth daily.   heparin 5000 UNIT/ML injection Inject 5,000 Units into the skin every 8 (eight) hours.   hydrALAZINE 100 MG tablet Commonly known as:  APRESOLINE Take 50 mg by mouth every 8 (eight) hours.   lisinopril 40 MG tablet Commonly known as:  PRINIVIL,ZESTRIL Take 40 mg by mouth daily.   magnesium oxide 400 MG tablet Commonly known as:  MAG-OX Take 400 mg by mouth 2 (two) times daily.   megestrol 400 MG/10ML suspension Commonly known as:  MEGACE Take 10 mLs (400 mg total) by mouth daily.   mirtazapine 30 MG tablet Commonly known as:  REMERON Take 1 tablet (30 mg total) by mouth at bedtime.   multivitamin animal shapes (with Ca/FA) with C & FA chewable tablet Chew 1 tablet by mouth daily.   senna 8.6 MG Tabs tablet Commonly known as:   SENOKOT Take 2 tablets by mouth at bedtime.   traMADol 50 MG tablet Commonly known as:  ULTRAM Take 1 tablet (50 mg total) by mouth every 6 (six) hours as needed for moderate pain.            Home Infusion Instuctions  (From admission, onward)         Start     Ordered   08/10/18 0000  Home infusion instructions [facility] May follow [place] Dosing Protocol; May administer Cathflo as needed to maintain patency of vascular access device.; Flushing of vascular access device: per [facility] Protocol: 0.9% NaCl pre/post medica...    Question Answer Comment  Instructions May follow [place] Dosing Protocol   Instructions May administer Cathflo as needed to maintain patency of vascular access device.   Instructions Flushing of vascular access device: per [facility] Protocol: 0.9% NaCl pre/post medication administration and prn patency; Heparin 100 u/ml, 107m for implanted ports and Heparin 10u/ml, 566mfor all other central venous catheters.   Instructions May follow [facility] Anaphylaxis Protocol for First Dose Administration in the home: 0.9% NaCl at  25-50 ml/hr to maintain IV access for protocol meds. Epinephrine 0.3 ml IV/IM PRN and Benadryl 25-50 IV/IM PRN s/s of anaphylaxis.   Instructions [facility] Infusion Coordinator (RN) to assist per patient IV care needs in the home PRN.      08/10/18 4854           Discharge Care Instructions  (From admission, onward)         Start     Ordered   08/10/18 0000  Discharge wound care:    Comments:  Recommend flushing with 3 mL twice daily and return in 2 weeks for repeat sinogram.   08/10/18 6270         Follow-up Information    Leighton Ruff, MD Follow up on 08/30/2018.   Specialty:  General Surgery Why:  at 9 am for follow up Contact information: [address] [place] [address] [phone]        Thayer Headings, MD Follow up.   Specialty:  Infectious Diseases Why:  09/06/18 @ 10:30 am Contact  information: [address] [address] [phone]        [name], MD. Schedule an appointment as soon as possible for a visit in 1 week(s).   Specialty:  Hematology and Oncology Contact information: [place] [address] [place]. Schedule an appointment as soon as possible for a visit in 2 week(s).   Why:  they will call you for a follow up appt         Allergies  Allergen Reactions  . Oxycodone Other (See Comments)    confusion    Consultations:   ID, general surgery and IR   Other Procedures/Studies: Dg Chest 2 View  Result Date: 07/21/2018 CLINICAL DATA:  Lethargy and weakness. EXAM: CHEST - 2 VIEW COMPARISON:  Chest x-ray 06/30/2018 FINDINGS: The heart is borderline enlarged but stable. A loop recorder is noted. Left-sided PICC line is in good position with tip in the distal SVC near the cavoatrial junction. The right-sided power port has been removed since the prior chest film. No acute pulmonary findings. No edema or effusions. No worrisome pulmonary lesions. The bony thorax is intact. IMPRESSION: 1. No acute cardiopulmonary findings. 2. Left PICC line tip in good position. Electronically Signed   By: Marijo Sanes M.D.   On: 07/21/2018 14:15   Ct Head Wo Contrast  Result Date: 07/21/2018 CLINICAL DATA:  Altered level of consciousness. EXAM: CT HEAD WITHOUT CONTRAST TECHNIQUE: Contiguous axial images were obtained from the base of the skull through the vertex without intravenous contrast. COMPARISON:  CT scan of June 30, 2018. FINDINGS: Brain: Mild chronic ischemic white matter disease is noted. No mass effect or midline shift is noted. Ventricular size is within normal limits. There is no evidence of mass lesion, hemorrhage or acute infarction. Vascular: No hyperdense vessel or unexpected calcification. Skull: Left frontal craniotomy is again noted. No acute abnormality is noted in  the skull. Sinuses/Orbits: No acute finding. Other: Stable aneurysm clip is noted in the region of the left MCA. IMPRESSION: Mild chronic ischemic white matter disease. Stable aneurysm clip in left MCA territory. No acute intracranial abnormality seen. Electronically Signed   By: Marijo Conception, M.D.   On: 07/21/2018 20:04   Ct Abdomen Pelvis W Contrast  Result Date: 07/21/2018 CLINICAL DATA:  Generalized abdominal pain. Prior pelvic abscess with drain remaining in  place. Decreased drainage. EXAM: CT ABDOMEN AND PELVIS WITH CONTRAST TECHNIQUE: Multidetector CT imaging of the abdomen and pelvis was performed using the standard protocol following bolus administration of intravenous contrast. CONTRAST:  4m OMNIPAQUE IOHEXOL 300 MG/ML SOLN, 345mOMNIPAQUE IOHEXOL 300 MG/ML SOLN COMPARISON:  06/30/2018 FINDINGS: Lower chest: Small right pleural effusion, stable. Bilateral lower lobe opacities, likely atelectasis. Hepatobiliary: No focal hepatic abnormality. Gallbladder unremarkable. Pancreas: No focal abnormality or ductal dilatation. Spleen: No focal abnormality.  Normal size. Adrenals/Urinary Tract: Small cyst in the lower pole of the left kidney, stable. No renal or adrenal mass otherwise. No hydronephrosis. Urinary bladder unremarkable. Stomach/Bowel: Extensive sigmoid diverticulosis and wall thickening. No visible active diverticulitis. Large stool burden in the colon. No evidence of bowel obstruction. 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 This is best visualized and measured on coronal image 50. This occurs at the aortic hiatus. This contains contrast material and moderate thrombus. Along the left side of the upper abdominal aorta is a stable saccular aneurysm which measures 3.7 cm in craniocaudal length on coronal image 47. Heavily calcified aorta and iliac vessels. No adenopathy. Reproductive: Prior  hysterectomy.  No adnexal masses. Other: Left pelvic drainage catheter remains in place, unchanged. No visible residual fluid collection. No free fluid or free air. Left midline ventral hernia noted in the infraumbilical region containing small bowel loop. No evidence of obstruction. Musculoskeletal: No acute bony abnormality. 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 This has rapidly developed since recent study. Recommend vascular surgical consultation. Sigmoid diverticulosis. Pelvic drainage catheter remains in the left side of the pelvis along the pelvic sidewall. No visible residual fluid collection. Large stool burden in the colon. Small right pleural effusion.  Bibasilar atelectasis. These results were called by telephone at the time of interpretation on 07/21/2018 at 8:10 pm to Dr. CHMarda Stalker who verbally acknowledged these results. Electronically Signed   By: KeRolm Baptise.D.   On: 07/21/2018 20:13   Dg Sinus/fist Tube Chk-non Gi  Result Date: 07/20/2018 CLINICAL DATA:  Left pelvic diverticular abscess, status post percutaneous drain EXAM: ABSCESS INJECTION CONTRAST:  10 cc Omnipaque 300 FLUOROSCOPY TIME:  Fluoroscopy Time:  42 seconds Radiation Exposure Index (if provided by the fluoroscopic device): 11 Number of Acquired Spot Images: 3 COMPARISON:  05/25/2018, 06/13/2018 FINDINGS: Fluoroscopic injection performed of the left pelvic sidewall abscess. Catheter has been repositioned further away from the sigmoid colon. With slow injection, there is a small remaining fistula track to the adjacent sigmoid colon. This does appear smaller when compared to 05/25/2018. No residual abscess cavity. IMPRESSION: Persistent fistula from the drain catheter site to the sigmoid with some improvement  compared to 05/25/2018. Electronically Signed   By: M.Jerilynn Mages Shick M.D.   On: 07/20/2018 10:45   Ir Radiologist Eval & Mgmt  Result Date: 07/20/2018 Please refer to notes tab for details about interventional procedure. (Op Note)    TODAY-DAY OF DISCHARGE:  Subjective:   BaLeighanne Adolphoday has no headache,no chest abdominal pain,no new weakness tingling or numbness, feels much better wants to go home today.   Objective:   Blood pressure 129/71, pulse 77, temperature 98.2 F (36.8 C), temperature source Oral, resp. rate  18, height _0  (1.6 m), weight 44.3 kg, SpO2 99 %.  Intake/Output Summary (Last 24 hours) at 08/10/2018 0958 Last data filed at 08/10/2018 0839 Gross per 24 hour  Intake 346 ml  Output 415 ml  Net -69 ml   Filed Weights   08/07/18 0500 08/09/18 0500 08/10/18 0600  Weight: 46.8 kg 44.2 kg 44.3 kg    Exam: Awake Alert, Oriented *3, No new F.N deficits, Normal affect [place].AT,PERRAL Supple Neck,No JVD, No cervical lymphadenopathy appriciated.  Symmetrical Chest wall movement, Good air movement bilaterally, CTAB RRR,No Gallops,Rubs or new Murmurs, No Parasternal Heave +ve B.Sounds, Abd Soft, Non tender, No organomegaly appriciated, No rebound -guarding or rigidity. No Cyanosis, Clubbing or edema, No new Rash or bruise   PERTINENT RADIOLOGIC STUDIES: Dg Chest 2 View  Result Date: 07/21/2018 CLINICAL DATA:  Lethargy and weakness. EXAM: CHEST - 2 VIEW COMPARISON:  Chest x-ray 06/30/2018 FINDINGS: The heart is borderline enlarged but stable. A loop recorder is noted. Left-sided PICC line is in good position with tip in the distal SVC near the cavoatrial junction. The right-sided power port has been removed since the prior chest film. No acute pulmonary findings. No edema or effusions. No worrisome pulmonary lesions. The bony thorax is intact. IMPRESSION: 1. No acute cardiopulmonary findings. 2. Left PICC line tip in good position. Electronically Signed   By: Marijo Sanes  M.D.   On: 07/21/2018 14:15   Ct Head Wo Contrast  Result Date: 07/21/2018 CLINICAL DATA:  Altered level of consciousness. EXAM: CT HEAD WITHOUT CONTRAST TECHNIQUE: Contiguous axial images were obtained from the base of the skull through the vertex without intravenous contrast. COMPARISON:  CT scan of June 30, 2018. FINDINGS: Brain: Mild chronic ischemic white matter disease is noted. No mass effect or midline shift is noted. Ventricular size is within normal limits. There is no evidence of mass lesion, hemorrhage or acute infarction. Vascular: No hyperdense vessel or unexpected calcification. Skull: Left frontal craniotomy is again noted. No acute abnormality is noted in the skull. Sinuses/Orbits: No acute finding. Other: Stable aneurysm clip is noted in the region of the left MCA. IMPRESSION: Mild chronic ischemic white matter disease. Stable aneurysm clip in left MCA territory. No acute intracranial abnormality seen. Electronically Signed   By: Marijo Conception, M.D.   On: 07/21/2018 20:04   Ct Abdomen Pelvis W Contrast  Result Date: 07/21/2018 CLINICAL DATA:  Generalized abdominal pain. Prior pelvic abscess with drain remaining in place. Decreased drainage. EXAM: CT ABDOMEN AND PELVIS WITH CONTRAST TECHNIQUE: Multidetector CT imaging of the abdomen and pelvis was performed using the standard protocol following bolus administration of intravenous contrast. CONTRAST:  63m OMNIPAQUE IOHEXOL 300 MG/ML SOLN, 383mOMNIPAQUE IOHEXOL 300 MG/ML SOLN COMPARISON:  06/30/2018 FINDINGS: Lower chest: Small right pleural effusion, stable. Bilateral lower lobe opacities, likely atelectasis. Hepatobiliary: No focal hepatic abnormality. Gallbladder unremarkable. Pancreas: No focal abnormality or ductal dilatation. Spleen: No focal abnormality.  Normal size. Adrenals/Urinary Tract: Small cyst in the lower pole of the left kidney, stable. No renal or adrenal mass otherwise. No hydronephrosis. Urinary bladder  unremarkable. Stomach/Bowel: Extensive sigmoid diverticulosis and wall thickening. No visible active diverticulitis. Large stool burden in the colon. No evidence of bowel obstruction. 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 This is best visualized and measured on coronal image 50. This occurs at the aortic hiatus. This contains contrast material and moderate thrombus. Along  the left side of the upper abdominal aorta is a stable saccular aneurysm which measures 3.7 cm in craniocaudal length on coronal image 47. Heavily calcified aorta and iliac vessels. No adenopathy. Reproductive: Prior hysterectomy.  No adnexal masses. Other: Left pelvic drainage catheter remains in place, unchanged. No visible residual fluid collection. No free fluid or free air. Left midline ventral hernia noted in the infraumbilical region containing small bowel loop. No evidence of obstruction. Musculoskeletal: No acute bony abnormality. 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 This has rapidly developed since recent study. Recommend vascular surgical consultation. Sigmoid diverticulosis. Pelvic drainage catheter remains in the left side of the pelvis along the pelvic sidewall. No visible residual fluid collection. Large stool burden in the colon. Small right pleural effusion.  Bibasilar atelectasis. These results were called by telephone at the time of interpretation on 07/21/2018 at 8:10 pm to Dr. Marda Stalker , who verbally acknowledged these results. Electronically Signed   By: Rolm Baptise M.D.   On: 07/21/2018 20:13   Dg Sinus/fist Tube Chk-non Gi  Result Date: 07/20/2018 CLINICAL DATA:  Left pelvic diverticular abscess,  status post percutaneous drain EXAM: ABSCESS INJECTION CONTRAST:  10 cc Omnipaque 300 FLUOROSCOPY TIME:  Fluoroscopy Time:  42 seconds Radiation Exposure Index (if provided by the fluoroscopic device): 11 Number of Acquired Spot Images: 3 COMPARISON:  05/25/2018, 06/13/2018 FINDINGS: Fluoroscopic injection performed of the left pelvic sidewall abscess. Catheter has been repositioned further away from the sigmoid colon. With slow injection, there is a small remaining fistula track to the adjacent sigmoid colon. This does appear smaller when compared to 05/25/2018. No residual abscess cavity. 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 Electronically Signed   By: Jerilynn Mages.  Shick M.D.   On: 07/20/2018 10:45   Ir Radiologist Eval & Mgmt  Result Date: 07/20/2018 Please refer to notes tab for details about interventional procedure. (Op Note)    PERTINENT LAB RESULTS: CBC: Recent Labs    08/09/18 0435 08/10/18 0403  WBC 7.3 6.3  HGB 10.0* 9.4*  HCT 32.0* 29.7*  PLT 331 280   CMET CMP     Component Value Date/Time   NA 136 08/10/2018 0403   K 3.5 08/10/2018 0403   CL 100 08/10/2018 0403   CO2 27 08/10/2018 0403   GLUCOSE 81 08/10/2018 0403   BUN 19 08/10/2018 0403   CREATININE 0.71 08/10/2018 0403   CREATININE 0.80 07/19/2018 1157   CALCIUM 9.3 08/10/2018 0403   PROT 7.6 08/09/2018 0435   ALBUMIN 2.6 (L) 08/09/2018 0435   AST 29 08/09/2018 0435   AST 17 07/19/2018 1157   ALT 20 08/09/2018 0435   ALT <6 07/19/2018 1157   ALKPHOS 74 08/09/2018 0435   BILITOT 0.4 08/09/2018 0435   BILITOT 0.4 07/19/2018 1157   GFRNONAA >60 08/10/2018 0403   GFRNONAA >60 07/19/2018 1157   GFRAA >60 08/10/2018 0403   GFRAA >60 07/19/2018 1157    GFR Estimated Creatinine Clearance: 43.8 mL/min (by C-G formula based on SCr of 0.71 mg/dL). No results for input(s): LIPASE, AMYLASE in the last 72 hours. No results for input(s):  CKTOTAL, CKMB, CKMBINDEX, TROPONINI in the last 72 hours. Invalid input(s): POCBNP No results for input(s): DDIMER in  the last 72 hours. No results for input(s): HGBA1C in the last 72 hours. No results for input(s): CHOL, HDL, LDLCALC, TRIG, CHOLHDL, LDLDIRECT in the last 72 hours. No results for input(s): TSH, T4TOTAL, T3FREE, THYROIDAB in the last 72 hours.  Invalid input(s): FREET3 No results for input(s): VITAMINB12, FOLATE, FERRITIN, TIBC, IRON, RETICCTPCT in the last 72 hours. Coags: No results for input(s): INR in the last 72 hours.  Invalid input(s): PT Microbiology: No results found for this or any previous visit (from the past 240 hour(s)).  FURTHER DISCHARGE INSTRUCTIONS:  Get Medicines reviewed and adjusted: Please take all your medications with you for your next visit with your Primary MD  Laboratory/radiological data: Please request your Primary MD to go over all hospital tests and procedure/radiological results at the follow up, please ask your Primary MD to get all Hospital records sent to his/her office.  In some cases, they will be blood work, cultures and biopsy results pending at the time of your discharge. Please request that your primary care M.D. goes through all the records of your hospital data and follows up on these results.  Also Note the following: If you experience worsening of your admission symptoms, develop shortness of breath, life threatening emergency, suicidal or homicidal thoughts you must seek medical attention immediately by calling 911 or calling your MD immediately  if symptoms less severe.  You must read complete instructions/literature along with all the possible adverse reactions/side effects for all the Medicines you take and that have been prescribed to you. Take any new Medicines after you have completely understood and accpet all the possible adverse reactions/side effects.   Do not drive when taking Pain medications or sleeping  medications (Benzodaizepines)  Do not take more than prescribed Pain, Sleep and Anxiety Medications. It is not advisable to combine anxiety,sleep and pain medications without talking with your primary care practitioner  Special Instructions: If you have smoked or chewed Tobacco  in the last 2 yrs please stop smoking, stop any regular Alcohol  and or any Recreational drug use.  Wear Seat belts while driving.  Please note: You were cared for by a hospitalist during your hospital stay. Once you are discharged, your primary care physician will handle any further medical issues. Please note that NO REFILLS for any discharge medications will be authorized once you are discharged, as it is imperative that you return to your primary care physician (or establish a relationship with a primary care physician if you do not have one) for your post hospital discharge needs so that they can reassess your need for medications and monitor your lab values.  Total Time spent coordinating discharge including counseling, education and face to face time equals 35 minutes.  SignedOren Binet 08/10/2018 9:58 AM

## 2018-08-10 NOTE — Progress Notes (Addendum)
Family member that stayed last night told son that she has been confused throughout the night and talking to people that are not in the room and not slept any. Son arrived in room during shift report.  Pt throughout the night has been A&Ox3. Pt is currently A&Ox3. Flushed pt's JP drain at 0600 this morning with 3cc of NS. Pt had 5cc of milky creamy drainage emptied. Pt's JP drain is now draining dark red drainage. Alma Friendly, dayshift RN and Dr. Sloan Leiter aware of situation. Told son that MD had been notified of situation and would be rounding on pt this morning. Will continue to monitor pt. Ranelle Oyster, RN

## 2018-08-11 ENCOUNTER — Telehealth: Payer: Self-pay | Admitting: Oncology

## 2018-08-11 NOTE — Telephone Encounter (Signed)
Called [name] back to see if he was able to talk to the hospitalist and have the medications sent to the pharmacy.  He said the doctor said he would call them in.  Called [facility] and they are available for pick up.

## 2018-08-11 NOTE — Telephone Encounter (Signed)
[name] called and said he was not able to be there yesterday when the nurse went over his mother's discharge paperwork.  He said he does not have her bp medications: amlodipine, carvedilol, lisinopril and hydralazine.  He also does not have the heparin.  He is going to try calling [facility] back and also said [facility] will be seeing her today from 13 -2.  Called Pam with [facility] and she will have them check her BP and will notify us when it is done.   Called [name] back and he said he is waiting on a call from the hospitalist.  Advised him that [place] really needs to establish care with a PCP and recommended [facility] [address].  He verbalized agreement and said he will call back after he talks to the hospitalist.

## 2018-08-15 ENCOUNTER — Telehealth: Payer: Self-pay | Admitting: Oncology

## 2018-08-15 ENCOUNTER — Encounter: Payer: Self-pay | Admitting: Hematology and Oncology

## 2018-08-15 NOTE — Telephone Encounter (Signed)
Jo Parker left a message saying that Jo Parker has an appointment at [facility] at [facility] on 08/26/18.  He is wondering if there is a certain doctor at [facility] that we recommend and if it is OK for them to call the [place] location to see if they have any earlier appointments.  Called and left message advising him that the appointment on 08/26/18 is good to establish care.

## 2018-08-17 ENCOUNTER — Telehealth: Payer: Self-pay | Admitting: Oncology

## 2018-08-17 NOTE — Telephone Encounter (Signed)
Called [name] back and he said that Jo Parker's blood pressure was 129/70 so they decided not to go to the ER.  He thinks it may be due to her bp medications.  She has not taken any today.  He is going to call the doctor at [facility] for direction on continuing the bp medications.

## 2018-08-17 NOTE — Telephone Encounter (Signed)
[name] called and said Jo Parker's bp during the night was high at 170/119.  He said it has gone down since then.  [facility] saw her today and said it was 60/40 and then went up to 80 systolic.  He said they have called the ambulance and he is on his way home.  He wanted to give [name] an update.

## 2018-08-19 ENCOUNTER — Telehealth: Payer: Self-pay

## 2018-08-19 ENCOUNTER — Other Ambulatory Visit: Payer: Self-pay

## 2018-08-19 DIAGNOSIS — E43 Unspecified severe protein-calorie malnutrition: Secondary | ICD-10-CM

## 2018-08-19 DIAGNOSIS — C569 Malignant neoplasm of unspecified ovary: Secondary | ICD-10-CM

## 2018-08-19 DIAGNOSIS — R531 Weakness: Secondary | ICD-10-CM

## 2018-08-19 DIAGNOSIS — C482 Malignant neoplasm of peritoneum, unspecified: Secondary | ICD-10-CM

## 2018-08-19 NOTE — Telephone Encounter (Signed)
Orders for [facility] PT faxed to [facility].

## 2018-08-23 ENCOUNTER — Telehealth: Payer: Self-pay

## 2018-08-23 NOTE — Telephone Encounter (Signed)
Received fax from [facility] needing orders signed by NG for blood work and PICC line dressing changes.  Per NG these orders need to be signed by Infectious Disease physician.  Spoke with Carolynn Sayers, RN for [facility].  She states she will take care of getting orders signed by correct physician.

## 2018-08-26 ENCOUNTER — Ambulatory Visit: Payer: Medicare PPO | Admitting: Family Medicine

## 2018-09-06 ENCOUNTER — Ambulatory Visit (INDEPENDENT_AMBULATORY_CARE_PROVIDER_SITE_OTHER): Payer: Medicare PPO | Admitting: Internal Medicine

## 2018-09-06 ENCOUNTER — Encounter: Payer: Self-pay | Admitting: Internal Medicine

## 2018-09-06 ENCOUNTER — Other Ambulatory Visit: Payer: Self-pay

## 2018-09-06 ENCOUNTER — Telehealth: Payer: Self-pay

## 2018-09-06 DIAGNOSIS — E43 Unspecified severe protein-calorie malnutrition: Secondary | ICD-10-CM

## 2018-09-06 DIAGNOSIS — K651 Peritoneal abscess: Secondary | ICD-10-CM | POA: Insufficient documentation

## 2018-09-06 DIAGNOSIS — I729 Aneurysm of unspecified site: Secondary | ICD-10-CM | POA: Diagnosis not present

## 2018-09-06 MED ORDER — AMOXICILLIN-POT CLAVULANATE 875-125 MG PO TABS: 1.0000 | ORAL_TABLET | Freq: Two times a day (BID) | ORAL | 1 refills | Status: DC

## 2018-09-06 MED ORDER — FLUCONAZOLE 200 MG PO TABS: 200.0000 mg | ORAL_TABLET | Freq: Every day | ORAL | 1 refills | Status: AC

## 2018-09-06 NOTE — Telephone Encounter (Signed)
[facility], spoke with Jackelyn Poling to relay new orders per Dr. Linus Salmons, to continue IV antibiotics until April 3rd, 2020 and to have PICC line removed following the last dose. Patient to start oral antibiotics after PICC removal.  Debbie confirmed understanding with feedback.  Patient was made aware of treatment plan on office visit.   Jo Parker, [place]

## 2018-09-06 NOTE — Progress Notes (Signed)
   Subjective:    Patient ID: Jo Parker, female    DOB: 02-28-1946, 73 y.o.   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  She later developed MSSA bacteremia from her port, which was removed.  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  She had a bowel perforation and developed an abdominal abscess.  She is completing 6 weeks of ertapenem and oral fluconazole, high dose.  She still has the drain in place and follow up with Dr. Marcello Moores has been postponed due to [place].  No significant new issues, no fever, no chills.  No new weight loss.  Picc line working and no issues.  Due to finish April 3rd.     Review of Systems  Constitutional: Negative for fatigue and fever.  Gastrointestinal: Negative for diarrhea and nausea.  Skin: Negative for rash.       Objective:   Physical Exam Constitutional:      Comments: Chronically ill-appearing  Eyes:     General: No scleral icterus. Cardiovascular:     Rate and Rhythm: Normal rate and regular rhythm.  Pulmonary:     Effort: Pulmonary effort is normal.     Breath sounds: Normal breath sounds.  Skin:    Findings: No rash.  Neurological:     Mental Status: She is alert.   SH: lives at home, getting PT at home       Assessment & Plan:

## 2018-09-06 NOTE — Assessment & Plan Note (Signed)
MSSA in blood previously.  She will continue on Augmentin for suppression.

## 2018-09-06 NOTE — Assessment & Plan Note (Addendum)
Drain still in place and scant amount of drainage.  I will transition her to oral therapy for this.  She is going to follow up with Dr. Marcello Moores in regards to this, by her report.  I will give her 30 days of Augmentin and fluconazole and a refill.  Probably wll need to be suppressive and long-term for her.

## 2018-09-06 NOTE — Assessment & Plan Note (Signed)
Encouraged continued nutrition

## 2018-09-16 ENCOUNTER — Other Ambulatory Visit: Payer: Self-pay | Admitting: General Surgery

## 2018-09-16 DIAGNOSIS — N739 Female pelvic inflammatory disease, unspecified: Secondary | ICD-10-CM

## 2018-09-19 ENCOUNTER — Telehealth: Payer: Self-pay | Admitting: Hematology and Oncology

## 2018-09-19 ENCOUNTER — Telehealth: Payer: Self-pay | Admitting: Oncology

## 2018-09-19 NOTE — Telephone Encounter (Signed)
Copied from [place] [phone]. Topic: Appointment Scheduling - Scheduling Inquiry for Clinic >> Sep 19, 2018  2:59 PM Berneta Levins wrote: Reason for CRM:  Pt's son called and left message on [place].  States that they need to reschedule new pt appointment because pt needs a medical home.  Son can be reached at [phone].

## 2018-09-19 NOTE — Telephone Encounter (Signed)
Jo Parker left a message saying that he has been having trouble getting a hold of [name] for medication refills.  He said she is out of Plavix and has 3-4 tablets of carvedilol left.

## 2018-09-21 ENCOUNTER — Ambulatory Visit
Admission: RE | Admit: 2018-09-21 | Discharge: 2018-09-21 | Disposition: A | Discharge status: Home / Self Care | Payer: Medicare PPO | Source: Ambulatory Visit | Attending: General Surgery | Admitting: General Surgery

## 2018-09-21 ENCOUNTER — Other Ambulatory Visit: Payer: Self-pay

## 2018-09-21 DIAGNOSIS — N739 Female pelvic inflammatory disease, unspecified: Secondary | ICD-10-CM

## 2018-09-21 NOTE — Progress Notes (Addendum)
Referring Physician(s): Thomas,Alicia  Chief Complaint: The patient is seen in follow up today s/p 04/1118: CT-guided placement of a drainage catheter within the left pelvic sidewall collection.  History of present illness:  Peritoneal cancer Abscess drain was placed 04/18/18 We have injected this drain several times and has demonstrated fistula each time. Most recent injection: 07/20/18  Pt is not flushing this drain Draining to JP actually at this time Dr Linus Salmons changed her meds to Augmentin 875 mg BID on 3/31--- for 1 mo  She denies fever/chills Denies pain OP she says is minimal  Past Medical History:  Diagnosis Date  . Acute on chronic respiratory failure with hypoxia ([place]) 03/07/2018  . Anxiety and depression   . Aortic aneurysm ([place])    see ov note 03/2017   . Arthritis   . Aspiration pneumonia ([place])   . Carcinomatosis ([place]) 10/2017  . CHF (congestive heart failure) ([name])    diastolic dysfunction  . COPD (chronic obstructive pulmonary disease) ([place]) 10/23/2017  . Depression   . Family history of breast cancer   . Family history of prostate cancer   . Family history of uterine cancer   . History of diverticulitis   . HLD (hyperlipidemia) 10/23/2017  . Hypertension   . Mitral valve prolapse   . Pelvic abscess in female   . Sleep apnea    cpap machine mask and tubing in [place] per patient.  Patient does not have mask or tubing to bring on DOS of 01/27/2018.   Marland Kitchen Uterine cancer ([place])    had partial hysterectomy at 27    Past Surgical History:  Procedure Laterality Date  . ABDOMINAL HYSTERECTOMY    . cardiac monitor implant    . CEREBRAL ANEURYSM REPAIR    . CERVICAL SPINE SURGERY     bone spurs  . DEBULKING N/A 01/27/2018   Procedure: RADICAL DEBULKING;  Surgeon: [name], MD;  Location: [facility];  Service: Gynecology;  Laterality: N/A;  . IR CATHETER TUBE CHANGE  06/13/2018  . IR FLUORO GUIDE PORT INSERTION RIGHT  10/14/2017  . IR PARACENTESIS  10/14/2017   . IR RADIOLOGIST EVAL & MGMT  04/26/2018  . IR RADIOLOGIST EVAL & MGMT  05/10/2018  . IR RADIOLOGIST EVAL & MGMT  05/25/2018  . IR RADIOLOGIST EVAL & MGMT  07/20/2018  . IR REMOVAL TUN ACCESS W/ PORT W/O FL MOD SED  07/02/2018  . IR US GUIDE VASC ACCESS RIGHT  10/14/2017  . LAPAROTOMY N/A 01/27/2018   Procedure: EXPLORATORY LAPAROTOMY WITH REMOVAL OF BILATERAL TUBES AND OVARIES;  Surgeon: [name], MD;  Location: [facility];  Service: Gynecology;  Laterality: N/A;  . LAPAROTOMY N/A 01/31/2018   Procedure: EXPLORATORY LAPAROTOMY, ABDOMINAL WASHOUT;  Surgeon: Isabel Caprice, MD;  Location: [facility];  Service: Gynecology;  Laterality: N/A;  . OMENTECTOMY N/A 01/27/2018   Procedure: OMENTECTOMY;  Surgeon: [name], MD;  Location: [facility];  Service: Gynecology;  Laterality: N/A;  . PARTIAL HYSTERECTOMY  1971   "precancer"  . TEE WITHOUT CARDIOVERSION N/A 07/04/2018   Procedure: TRANSESOPHAGEAL ECHOCARDIOGRAM (TEE);  Surgeon: Sanda Klein, MD;  Location: [facility] ENDOSCOPY;  Service: Cardiovascular;  Laterality: N/A;    Allergies: Oxycodone  Medications: Prior to Admission medications   Medication Sig Start Date End Date Taking? Authorizing Provider  amLODipine (NORVASC) 5 MG tablet Take 5 mg by mouth daily. 08/04/18 08/04/19  [provider]  amoxicillin-clavulanate (AUGMENTIN) 875-125 MG tablet Take 1 tablet by mouth 2 (two) times daily.  09/06/18   ComerOkey Regal, MD  feeding supplement, ENSURE ENLIVE, (ENSURE ENLIVE) LIQD Take 237 mLs by mouth 2 (two) times daily between meals. 08/10/18   Ghimire, Henreitta Leber, MD  fluconazole (DIFLUCAN) 200 MG tablet Take 1 tablet (200 mg total) by mouth daily. 09/06/18   Comer, Okey Regal, MD  magnesium oxide (MAG-OX) 400 MG tablet Take 400 mg by mouth 2 (two) times daily.     [provider]  megestrol (MEGACE) 400 MG/10ML suspension Take 10 mLs (400 mg total) by mouth daily. 08/10/18   Ghimire, Henreitta Leber, MD  mirtazapine (REMERON) 30 MG tablet Take  1 tablet (30 mg total) by mouth at bedtime. 05/13/18   [name], MD  Pediatric Multiple Vit-C-FA (MULTIVITAMIN ANIMAL SHAPES, WITH CA/FA,) with C & FA chewable tablet Chew 1 tablet by mouth daily.    [provider]  traMADol (ULTRAM) 50 MG tablet Take 1 tablet (50 mg total) by mouth every 6 (six) hours as needed for moderate pain. 08/10/18   Ghimire, Henreitta Leber, MD     Family History  Problem Relation Age of Onset  . Endometrial cancer Sister 50       endometrial ca  . Endometrial cancer Paternal Aunt 80  . Breast cancer Paternal Aunt   . Stomach cancer Maternal Grandfather        d. 61s  . Prostate cancer Paternal Uncle   . Breast cancer Cousin        pat first cousin  . Breast cancer Cousin        pat first cousin's daughter  . Lung cancer Father        lung ca  . Heart disease Mother   . Stroke Paternal Grandmother   . Endometrial cancer Cousin        paternal first cousin's daughter  . Breast cancer Other        niece dx less than 60s  . Breast cancer Other        niece, dx under 65    Social History   Socioeconomic History  . Marital status: Married    Spouse name: Not on file  . Number of children: 3  . Years of education: Not on file  . Highest education level: Not on file  Occupational History  . Occupation: retired  Scientific laboratory technician  . Financial resource strain: Not on file  . Food insecurity:    Worry: Not on file    Inability: Not on file  . Transportation needs:    Medical: Not on file    Non-medical: Not on file  Tobacco Use  . Smoking status: Former Smoker    Packs/day: 1.00    Years: 55.00    Pack years: 55.00    Last attempt to quit: 10/06/2017    Years since quitting: 0.9  . Smokeless tobacco: Never Used  Substance and Sexual Activity  . Alcohol use: Never    Frequency: Never  . Drug use: Never  . Sexual activity: Not Currently  Lifestyle  . Physical activity:    Days per week: Not on file    Minutes per session: Not on file  .  Stress: Not on file  Relationships  . Social connections:    Talks on phone: Not on file    Gets together: Not on file    Attends religious service: Not on file    Active member of club or organization: Not on file    Attends meetings of clubs  or organizations: Not on file    Relationship status: Not on file  Other Topics Concern  . Not on file  Social History Narrative  . Not on file     Vital Signs: Temp 98.1; BP 141/63; 02 100% RA; P 96  Physical Exam Skin:    General: Skin is warm and dry.     Comments: Site is clean and dry Sutures have become separated from skin-- but drain is in good position  No migration of drain is noted Secured to skin with appropriate size stat lock To gravity bag    Drain injection does still reveal +fistula per Dr Kathlene Cote  Imaging: No results found.  Labs:  CBC: Recent Labs    08/07/18 0326 08/08/18 0345 08/09/18 0435 08/10/18 0403  WBC 8.1 7.6 7.3 6.3  HGB 10.1* 10.2* 10.0* 9.4*  HCT 31.6* 32.1* 32.0* 29.7*  PLT 346 330 331 280    COAGS: Recent Labs    10/08/17 0710  01/31/18 0850  06/30/18 1704 07/02/18 0500 07/21/18 1324 07/23/18 1109  INR 1.22   < > 1.36   < > 1.54 1.54 1.24 1.52  APTT 32  --  36  --   --   --   --   --    < > = values in this interval not displayed.    BMP: Recent Labs    08/07/18 0326 08/08/18 0345 08/09/18 0435 08/10/18 0403  NA 134* 135 135 136  K 3.7 3.3* 4.0 3.5  CL 96* 96* 97* 100  CO2 28 29 29 27   GLUCOSE 130* 100* 98 81  BUN 10 10 20 19   CALCIUM 9.0 9.0 9.4 9.3  CREATININE 0.71 0.78 0.97 0.71  GFRNONAA >60 >60 58* >60  GFRAA >60 >60 >60 >60    LIVER FUNCTION TESTS: Recent Labs    07/21/18 1324 07/23/18 1109 08/06/18 0323 08/09/18 0435  BILITOT 0.7 0.6 0.6 0.4  AST 23 27 26 29   ALT 10 15 12 20   ALKPHOS 62 64 79 74  PROT 8.5* 6.5 6.9 7.6  ALBUMIN 3.4* 2.3* 2.4* 2.6*    Assessment:  Discussed with Dr Kathlene Cote Continue antibiotics Changed to gravity bag drain  Continue NO FLUSH Continued drain injections are not warranted at this point Will need to follow up with DrThomas as to next plan Pt and driver understand plan  Signed: Lavonia Drafts, PA-C 09/21/2018, 8:30 AM   Please refer to Dr. Kathlene Cote attestation of this note for management and plan.

## 2018-09-22 ENCOUNTER — Telehealth: Payer: Self-pay | Admitting: Oncology

## 2018-09-22 ENCOUNTER — Encounter: Payer: Self-pay | Admitting: Hematology and Oncology

## 2018-09-22 NOTE — Telephone Encounter (Signed)
Called Jo Parker to see how Jo Parker is doing. Requested a return call.

## 2018-09-22 NOTE — Telephone Encounter (Addendum)
Called Jo Parker back and advised him of message from Dr. Alvy Bimler..  He said he had an appointment at [facility] scheduled for 3/24 with Dr. Orma Flaming that was canceled.  He is trying to reschedule the appointment and called the office this week.  He also said the use the email [email or web address] for the Webex visit with Dr. Skeet Latch.

## 2018-09-22 NOTE — Telephone Encounter (Signed)
Jo Parker called back and said Jo Parker is doing well.  She is not having any pain and her appetite is increasing.  He said the only thing that is bothering her is that she is not able to move around like she used to.  She is getting home physical therapy.  He also said that she had pulled her drain out a little and it was not draining.  She went to [place] yesterday and had it replaced.  It is now draining again. She will see Dr. Marcello Moores next Tuesday, 09/27/18.   Discussed a palliative care consult and he said he doesn't think she is ready.  He said that might make her feel like it's one step closer to the end.  Explained that palliative care is not hospice and that they would be able to help her with any needs she may have.  He said they will think about it.    Also discussed setting up a Webex follow up with Dr. Skeet Latch on 09/29/18.  He said he would like to arrange that.  He has been trying to set up an appointment to establish primary care but has not been able to with Covid-19.  Advised him that we will call him back with instructions.

## 2018-09-22 NOTE — Telephone Encounter (Signed)
We cannot really refill her medications without assessment Having palliative care is a good idea but if he declines then I suggest he call around and see which PCP office is willing to do e-visits (I know [address] is offering that

## 2018-09-23 ENCOUNTER — Telehealth: Payer: Self-pay | Admitting: *Deleted

## 2018-09-23 NOTE — Telephone Encounter (Signed)
Called and spoke with the son, gave the appt date/time for 4/23 at 2:15pm. Explained that he will receive an e-mail invite and instructions for the visit.

## 2018-09-28 ENCOUNTER — Other Ambulatory Visit (HOSPITAL_COMMUNITY): Payer: Self-pay | Admitting: Diagnostic Radiology

## 2018-09-28 ENCOUNTER — Encounter (HOSPITAL_COMMUNITY): Payer: Self-pay | Admitting: Diagnostic Radiology

## 2018-09-28 ENCOUNTER — Other Ambulatory Visit: Payer: Self-pay

## 2018-09-28 ENCOUNTER — Ambulatory Visit (HOSPITAL_COMMUNITY)
Admission: RE | Admit: 2018-09-28 | Discharge: 2018-09-28 | Disposition: A | Discharge status: Home / Self Care | Payer: Medicare PPO | Source: Ambulatory Visit | Attending: Diagnostic Radiology | Admitting: Diagnostic Radiology

## 2018-09-28 DIAGNOSIS — N739 Female pelvic inflammatory disease, unspecified: Secondary | ICD-10-CM | POA: Insufficient documentation

## 2018-09-28 DIAGNOSIS — Z4803 Encounter for change or removal of drains: Secondary | ICD-10-CM | POA: Diagnosis present

## 2018-09-28 DIAGNOSIS — K632 Fistula of intestine: Secondary | ICD-10-CM | POA: Diagnosis not present

## 2018-09-28 HISTORY — PX: IR CATHETER TUBE CHANGE: IMG717

## 2018-09-28 MED ORDER — LIDOCAINE HCL 1 % IJ SOLN
INTRAMUSCULAR | Status: AC | PRN
  Administered 2018-09-28: 5 mL

## 2018-09-28 MED ORDER — IOHEXOL 300 MG/ML  SOLN
50.0000 mL | Freq: Once | INTRAMUSCULAR | Status: AC | PRN
  Administered 2018-09-28: 15 mL

## 2018-09-28 MED ORDER — LIDOCAINE HCL 1 % IJ SOLN
INTRAMUSCULAR | Status: AC
  Filled 2018-09-28: qty 20

## 2018-09-28 NOTE — Procedures (Signed)
Interventional Radiology Procedure:   Indications: Left pelvic abscess drain was partially dislodged and patient complaining of leaking  Procedure: Drain injection and exchange with fluoroscopy  Findings: Drain was almost completely dislodged, side holes outside of body.  New 10 Fr drain placed and removed 30 ml of pink purulent fluid from abscess cavity.  Residual colonic fistula identified.  Complications: None     EBL: Less than 10 ml  Plan: Continue drainage with gravity bag, no flushing.  Based on prior IR clinic note, drain and colonic fistula being managed by General Surgery.       Taisha Pennebaker R. Anselm Pancoast, MD  Pager: [phone]

## 2018-09-29 ENCOUNTER — Inpatient Hospital Stay: Payer: Medicare PPO | Attending: Hematology and Oncology | Admitting: Gynecologic Oncology

## 2018-09-29 ENCOUNTER — Encounter: Payer: Self-pay | Admitting: Diagnostic Radiology

## 2018-09-29 ENCOUNTER — Other Ambulatory Visit: Payer: Self-pay | Admitting: Gynecologic Oncology

## 2018-09-29 DIAGNOSIS — C482 Malignant neoplasm of peritoneum, unspecified: Secondary | ICD-10-CM

## 2018-09-29 DIAGNOSIS — K632 Fistula of intestine: Secondary | ICD-10-CM

## 2018-09-29 DIAGNOSIS — K651 Peritoneal abscess: Secondary | ICD-10-CM

## 2018-09-29 NOTE — Progress Notes (Signed)
Patient ID: Jo Parker, female   DOB: 1946/05/18, 73 y.o.   MRN: [id number] Discussed long term plan for the percutaneous abscess drain with Dr. Leighton Ruff in Surgery.  Patient is not a surgical candidate and she recommends slowly pulling back drain until the colonic fistula heals.  Currently, patient has a drain in the left pelvic abscess to a gravity bag and no flushing.   Will schedule patient for drain injection and try to slowly pull drain back over time.

## 2018-09-29 NOTE — Progress Notes (Signed)
GYN ONCOLOGY OFFICE VISIT  Referring Clinician:  Dr. Kayleen Memos  CC:  Pelvic fluid collection  HPI:  HISTORY OF PRESENT ILLNESS:  [name]  is a very nice 73 y.o.  P3 with fallopian tube cancer.  Virtual visit performed from her son's home. He husband was also present for the visit.   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 CK20, CEA and Ca19-9 were negative    09/16/2017 Imaging    CT abdomen and pelvis elsewhere showed small bilateral pleural effusion, cardiomegaly, 2.6 cm ovoid cystic mass at the pancreatic tail, 3.5 cm saccular aneurysm, no retroperitoneal lymphadenopathy, marked sigmoid wall thickening without overt mechanical bowel obstruction, marked omental thickening and ascites.    10/07/2017 Genetic Testing    BRCA2 c.5073dupA (p.Trp1692Metfs*3) pathogenic mutation and APC c.7088A>C (p.Lys2363Arg), POLE c.1007A>G (p.Asn36Ser) and RAD51D c.434G>A (p.Arg145His) VUS identified the [facility] panel.  The [facility] gene panel offered by [employer] includes sequencing and deletion/duplication testing of the following 35 genes: APC, ATM, AXIN2, BARD1, BMPR1A, BRCA1, BRCA2, BRIP1, CHD1, CDK4, CDKN2A, CHEK2, EPCAM (large rearrangement only), HOXB13, (sequencing only), GALNT12, MLH1, MSH2, MSH3 (excluding repetitive portions of exon 1), MSH6, MUTYH, NBN, NTHL1, PALB2, PMS2, PTEN, RAD51C, RAD51D, RNF43, RPS20, SMAD4, STK11, and TP53. Sequencing was performed for select regions of POLE and POLD1, and large rearrangement analysis was performed for  select regions of GREM1. The report date is Oct 07, 2017.    10/08/2017 Procedure    Extensive omental and peritoneal tumor is present anteriorly deep to the abdominal wall. From a midline approach, tumor was sampled just to the left of midline in the upper pelvis, below the level of the umbilicus. Solid tissue was obtained.  IMPRESSION: CT-guided core biopsy performed of omental/peritoneal tumor in the anterior peritoneal cavity.    10/08/2017 Pathology Results    Soft Tissue Needle Core Biopsy, Omentum/Peritoneal Cavity - POORLY DIFFERENTIATED CARCINOMA. Microscopic Comment Immunohistochemistry will be performed and reported as an addendum. Dr. [name] agrees. Called to [employer] on 10/11/17. (JDP:kh 10/11/17) ADDENDUM: Immunohistochemistry shows the tumor is positive with cytokeratin AE1/AE3, cytokeratin 7, estrogen receptor, WT-1, and p16. The tumor is negative with CD56, chromogranin, synaptophysin, CDX-2, cytokeratin 20, progesterone receptor, GATA-3, GCDFP, Napsin-A and thyroid transcription factor-1. 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 The catheter is ready for use.    10/14/2017 Procedure    A total of approximately 1300 mL of ascitic fluid was removed.    10/15/2017 Tumor Marker    Patient's tumor was tested for the following markers: CA-125 Results of the tumor marker test revealed 775.9    10/18/2017 -  Chemotherapy    The patient had carboplatin and Taxol  10/22/2017 - 10/27/2017 Hospital Admission    She was admitted to the hospital for evaluation of shortness of breath and was found to have large  pleural effusion, successfully removed and cytology was malignant.  Sepsis was ruled out.    10/23/2017 Imaging    No evidence of pulmonary embolism.  New moderate size left effusion and new small to moderate right effusion with associated bibasilar atelectasis.  Known extensive peritoneal carcinomatosis and moderate ascites.  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 Recommend correlation with prior exams and vascular surgery consultation.  2.1 cm left renal cyst.  Mild cardiomegaly and minimal atherosclerotic coronary artery disease.  Aortic Atherosclerosis (ICD10-I70.0) and Emphysema (ICD10-J43.9).    10/24/2017 Pathology Results    PLEURAL FLUID, LEFT (SPECIMEN 1 OF 1 COLLECTED 10/24/17): MALIGNANT CELLS CONSISTENT WITH METASTATIC ADENOCARCINOMA. SEE COMMENT.    10/24/2017 Procedure    Successful ultrasound guided left thoracentesis yielding 580 mL of pleural fluid.  Follow-up chest radiograph shows no pneumothorax    11/03/2017 Tumor Marker    Patient's tumor was tested for the following markers: CA-125 Results of the tumor marker test revealed 991.9    11/29/2017 Tumor Marker    Patient's tumor was tested for the following markers: CA-125 Results of the tumor marker test revealed 133    12/14/2017 Imaging    Decreased peritoneal carcinomatosis, and near complete resolution of ascites since prior exam.  No new or progressive disease within the abdomen or pelvis.  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  Skeet Latch, M.D. 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  Miliary disease of the bilateral diaphragmatic surfaces and also the mesentery of the large and small bowel.  Left adnexa densely adherent to the distal rectosigmoid colon.  Right adnexa adherent to the pelvic sidewall.  Multiple loops of bowel adherent to miliary disease in the vaginal cuff.  At the completion of procedure the patient was R1 disease status     01/27/2018 Pathology Results    1. Omentum, resection for tumor, infragastric - HIGH GRADE SEROUS CARCINOMA, >2 CM. - SEE ONCOLOGY TABLE. 2. Soft tissue mass, simple excision, abdominal wall nodule - HIGH GRADE SEROUS CARCINOMA, >2 CM. - SEE ONCOLOGY TABLE. 3. Ovary and fallopian tube, left - OVARY AND FALLOPIAN TUBE WITH SURFACE SEROUS CARCINOMA. 4. Ovary and fallopian tube, right - OVARY AND FALLOPIAN TUBE WITH SURFACE SEROUS CARCINOMA. Microscopic Comment 1. OVARY or FALLOPIAN TUBE or PRIMARY PERITONEUM: Procedure: Omentectomy, soft tissue excision, bilateral salpingo-oophorectomy. Specimen Integrity: Intact. Tumor Site: See comment. Ovarian Surface Involvement (required only if applicable): Present, bilateral. Fallopian Tube Surface Involvement (required only if applicable): Present, bilateral. Tumor  Size: >2 cm, see comment. Histologic Type: High grade serous carcinoma. Histologic Grade: High grade. Implants (required for advanced stage serous/seromucinous  borderline tumors only): See comment. Other Tissue/ Organ Involvement: Bilateral fallopian tubes, bilateral ovaries, omentum, peritoneum. Largest Extrapelvic Peritoneal Focus (required only if applicable): >2 cm. Peritoneal/Ascitic Fluid: N/A. Pleural effusion ([id number]) positive. Treatment Effect (required only for high-grade serous carcinomas): CRS-1 Regional Lymph Nodes: No lymph nodes submitted or found Pathologic Stage Classification (pTNM, AJCC 8th Edition): ypT3c, ypNX, pM1a Representative Tumor Block: 1A-B Comment(s): The omentum is largely infiltrated by tumor (>2 cm). Both ovaries and fallopian tubes were entirely submitted. A distinct precursor lesion is not identified, only surface deposits. While it is not recommended to diagnose a primary peritoneal carcinoma after treatment, it is assumed the likely primary given the lack of precursor lesion or significant disease in the ovaries and fallopian tubes.    01/27/2018 - 03/23/2018 Hospital Admission    She was admitted to the hospital for interval debulking surgery, complicated by sepsis and respiratory failure.    03/08/2018 Imaging    7.0 x 1.3 cm left pelvic fluid collection is noted with air-fluid level and enhancing margins, most consistent with abscess. It is slightly smaller compared to prior exam.  Stable position of surgical drainage catheter is seen in the pelvis.  Sigmoid diverticulosis is noted without inflammation.  3.9 cm aortic aneurysm is noted at aortic hiatus. Recommend followup by [name] in 2 years. This recommendation follows ACR consensus guidelines: White Paper of the ACR Incidental Findings Committee II on Vascular Findings. Joellyn Rued Radiol 2013; [phone].     03/29/2018 Tumor Marker    Patient's tumor was tested for the following markers: CA-125 Results of the tumor marker test revealed 35.3    04/07/2018 Imaging    CT abdomen and pelvis Persistent rim enhancing fluid and gas collection in the left  pelvic sidewall measuring 7.2 x 3.0 cm, and not significantly changed compared to prior study. Surgical drain remains in place in the central pelvis.  Sigmoid colon diverticulosis, with muscular hypertrophy versus mild diverticulitis.  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 Consider vascular surgery referral/consultation if not already obtained.    04/18/2018 Imaging    CT-guided placement of a drainage catheter within the left pelvic sidewall collection. The collection contains mostly gas and only 1 mL bloody fluid could be removed. The fluid was sent for culture.  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  Will schedule for follow-up in the IR clinic in 1 week with a CT and drain injection.    04/26/2018 Imaging    CT abdomen and pelvis 1. Left pelvic sidewall abscess collection is decompressed with the percutaneous drain. No residual fluid. There continues to be soft tissue thickening and small pockets of gas in this area. Findings concerning for a residual colonic fistula tract but this will be further characterized with drain injection with fluoroscopy. No new abscess or fluid collections. 2. Persistent gas in the left pelvis related to the old surgical drain site. However, this gas-filled cavity is decreasing in size compared to 04/18/2018. 3. 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 Aneurysm measures up to 3.8 cm. Aortic aneurysm NOS (ICD10-I71.9). Aortic Atherosclerosis (ICD10-I70.0).  4. 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    04/26/2018 Procedure    Positive for a fistula tract between the percutaneous drain and the sigmoid colon.  No significant abscess cavity.  05/10/2018 Tumor Marker    Patient's tumor was tested for the following markers: CA-125 Results of the tumor marker test revealed 18.8    05/10/2018 Imaging    1. Left pelvic sidewall abscess collection  remains decompressed with the percutaneous drain. No residual fluid collection. There continues to be a small amount of gas adjacent to the drain and likely related to a fistula tract. Patient is scheduled for a drain injection. 2. No new abscess or fluid collections. Again noted is trace perisplenic fluid. 3. 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 Aneurysm measures up to 3.8 cm. Aortic aneurysm NOS (ICD10-I71.9). Aortic Atherosclerosis (ICD10-I70.0).    05/10/2018 Procedure    Persistent fistula tract between the decompressed abscess cavity and the sigmoid colon. The fistula tract remains widely patent but smaller than it was on 04/26/2018.  The drain was kept in place. Instructed the patient to flush the tube every other day. Patient requested a suction bulb but we instructed the patient NOT to use suction on this bulb. Patient will follow-up for a drain injection in approximately 2 weeks.    06/10/2018 Tumor Marker    Patient's tumor was tested for the following markers: CA-125 Results of the tumor marker test revealed 19.4    06/30/2018 Imaging    1. New small RIGHT pleural effusion and nonspecific retroperitoneal/mesenteric edema. 2. LEFT pelvic drainage catheter again noted - no evidence of residual abscess. 3. 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 4. Cardiomegaly. 5.  Aortic Atherosclerosis (ICD10-I70.0).    06/30/2018 - 07/07/2018 Hospital Admission    She was admitted due to weakness and was found to have bacteremia    07/02/2018 Procedure    Status post removal of right IJ port catheter.    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  An attempt was  made to slowly withdraw the drain to see if this would slowly aid in resolution of the small persistent output.    An attempt was made last week with the development of an abscess.  Asucena Galer was stated on axoicillin with clavulanic acid  and the drain was replaced.  Since replacement Emmarie Sannes reports no fever but a sense of poor well being, poor appetite associated with medication, and low mood.   AT this time she is not taking remeron or megace.  She reports heartburn. No SOB, normal BM,no vaginal bleeding   Current Meds:  Outpatient Encounter Medications as of 09/29/2018  Medication Sig  . amLODipine (NORVASC) 5 MG tablet Take 5 mg by mouth daily.  Marland Kitchen amoxicillin-clavulanate (AUGMENTIN) 875-125 MG tablet Take 1 tablet by mouth 2 (two) times daily.  . feeding supplement, ENSURE ENLIVE, (ENSURE ENLIVE) LIQD Take 237 mLs by mouth 2 (two) times daily between meals.  . fluconazole (DIFLUCAN) 200 MG tablet Take 1 tablet (200 mg total) by mouth daily.  . magnesium oxide (MAG-OX) 400 MG tablet Take 400 mg by mouth 2 (two) times daily.   . megestrol (MEGACE) 400 MG/10ML suspension Take 10 mLs (400 mg total) by mouth daily.  . mirtazapine (REMERON) 30 MG tablet Take 1 tablet (30 mg total) by mouth at bedtime.  . Pediatric Multiple Vit-C-FA (MULTIVITAMIN ANIMAL SHAPES, WITH  CA/FA,) with C & FA chewable tablet Chew 1 tablet by mouth daily.  . traMADol (ULTRAM) 50 MG tablet Take 1 tablet (50 mg total) by mouth every 6 (six) hours as needed for moderate pain.   No facility-administered encounter medications on file as of 09/29/2018.     Allergy:  Allergies  Allergen Reactions  . Oxycodone Other (See Comments)    confusion    Social Hx:   Social History   Socioeconomic History  . Marital status: Married    Spouse name: Not on file  . Number of children: 3  . Years of education: Not on file  . Highest education level: Not on file  Occupational History  . Occupation: retired   Scientific laboratory technician  . Financial resource strain: Not on file  . Food insecurity:    Worry: Not on file    Inability: Not on file  . Transportation needs:    Medical: Not on file    Non-medical: Not on file  Tobacco Use  . Smoking status: Former Smoker    Packs/day: 1.00    Years: 55.00    Pack years: 55.00    Last attempt to quit: 10/06/2017    Years since quitting: 0.9  . Smokeless tobacco: Never Used  Substance and Sexual Activity  . Alcohol use: Never    Frequency: Never  . Drug use: Never  . Sexual activity: Not Currently  Lifestyle  . Physical activity:    Days per week: Not on file    Minutes per session: Not on file  . Stress: Not on file  Relationships  . Social connections:    Talks on phone: Not on file    Gets together: Not on file    Attends religious service: Not on file    Active member of club or organization: Not on file    Attends meetings of clubs or organizations: Not on file    Relationship status: Not on file  . Intimate partner violence:    Fear of current or ex partner: Not on file    Emotionally abused: Not on file    Physically abused: Not on file    Forced sexual activity: Not on file  Other Topics Concern  . Not on file  Social History Narrative  . Not on file    Past Surgical Hx:  Past Surgical History:  Procedure Laterality Date  . ABDOMINAL HYSTERECTOMY    . cardiac monitor implant    . CEREBRAL ANEURYSM REPAIR    . CERVICAL SPINE SURGERY     bone spurs  . DEBULKING N/A 01/27/2018   Procedure: RADICAL DEBULKING;  Surgeon: [name], MD;  Location: [facility];  Service: Gynecology;  Laterality: N/A;  . IR CATHETER TUBE CHANGE  06/13/2018  . IR CATHETER TUBE CHANGE  09/28/2018  . IR FLUORO GUIDE PORT INSERTION RIGHT  10/14/2017  . IR PARACENTESIS  10/14/2017  . IR RADIOLOGIST EVAL & MGMT  04/26/2018  . IR RADIOLOGIST EVAL & MGMT  05/10/2018  . IR RADIOLOGIST EVAL & MGMT  05/25/2018  . IR RADIOLOGIST EVAL & MGMT  07/20/2018  . IR REMOVAL TUN  ACCESS W/ PORT W/O FL MOD SED  07/02/2018  . IR US GUIDE VASC ACCESS RIGHT  10/14/2017  . LAPAROTOMY N/A 01/27/2018   Procedure: EXPLORATORY LAPAROTOMY WITH REMOVAL OF BILATERAL TUBES AND OVARIES;  Surgeon: [name], MD;  Location: [facility];  Service: Gynecology;  Laterality: N/A;  . LAPAROTOMY N/A 01/31/2018   Procedure:  EXPLORATORY LAPAROTOMY, ABDOMINAL WASHOUT;  Surgeon: Isabel Caprice, MD;  Location: [facility];  Service: Gynecology;  Laterality: N/A;  . OMENTECTOMY N/A 01/27/2018   Procedure: OMENTECTOMY;  Surgeon: [name], MD;  Location: [facility];  Service: Gynecology;  Laterality: N/A;  . PARTIAL HYSTERECTOMY  1971   "precancer"  . TEE WITHOUT CARDIOVERSION N/A 07/04/2018   Procedure: TRANSESOPHAGEAL ECHOCARDIOGRAM (TEE);  Surgeon: Sanda Klein, MD;  Location[facility];  Service: Cardiovascular;  Laterality: N/A;    Past Medical Hx:  Past Medical History:  Diagnosis Date  . Acute on chronic respiratory failure with hypoxia ([place]) 03/07/2018  . Anxiety and depression   . Aortic aneurysm ([place])    see ov note 03/2017   . Arthritis   . Aspiration pneumonia ([place])   . Carcinomatosis ([place]) 10/2017  . CHF (congestive heart failure) ([name])    diastolic dysfunction  . COPD (chronic obstructive pulmonary disease) ([place]) 10/23/2017  . Depression   . Family history of breast cancer   . Family history of prostate cancer   . Family history of uterine cancer   . History of diverticulitis   . HLD (hyperlipidemia) 10/23/2017  . Hypertension   . Mitral valve prolapse   . Pelvic abscess in female   . Sleep apnea    cpap machine mask and tubing in [place] per patient.  Patient does not have mask or tubing to bring on DOS of 01/27/2018.   Marland Kitchen Uterine cancer ([place])    had partial hysterectomy at 27    Past Gynecological History:   GYNECOLOGIC HISTORY:  No LMP recorded. Patient has had a hysterectomy.  For precancer Menarche: 73 years old P 3 LMP 1977 Contraceptive OCP x8 years HRT  none  Last Pap N/A  Family Hx:  Family History  Problem Relation Age of Onset  . Endometrial cancer Sister 78       endometrial ca  . Endometrial cancer Paternal Aunt 80  . Breast cancer Paternal Aunt   . Stomach cancer Maternal Grandfather        d. 60s  . Prostate cancer Paternal Uncle   . Breast cancer Cousin        pat first cousin  . Breast cancer Cousin        pat first cousin's daughter  . Lung cancer Father        lung ca  . Heart disease Mother   . Stroke Paternal Grandmother   . Endometrial cancer Cousin        paternal first cousin's daughter  . Breast cancer Other        niece dx less than 3s  . Breast cancer Other        niece, dx under 63    Review of Systems:  Review of Systems  Constitutional: Positive for appetite change and fatigue. Negative for chills.       Fair appetite  HENT:   Negative for tinnitus and trouble swallowing.   Eyes: Negative.   Respiratory: Negative for chest tightness, cough, shortness of breath and wheezing.   Cardiovascular: Negative for chest pain and leg swelling.  Gastrointestinal: Positive for abdominal pain and diarrhea. Negative for abdominal distention, constipation, nausea, rectal pain and vomiting.       Reports discomfort at the drain site last week.  Emesis one week ago while on Augmentin.    Endocrine: Negative.   Genitourinary: Positive for frequency. Negative for difficulty urinating, dysuria, hematuria, pelvic pain, vaginal bleeding and vaginal discharge.  Musculoskeletal: Negative for back pain.  Skin: Negative.   Neurological: Negative.   Hematological: Negative.   Psychiatric/Behavioral: Positive for depression. Negative for confusion. The patient is not nervous/anxious.    Physical Exam: There were no vitals taken for this visit. Weight 95.2 pounds per patient report General :   73 y.o., female in no apparent distress, frail appearing. HEENT:  Normocephalic/atraumatic, symmetric, EOMI, eyelids  normal Neck:    Abdomen:  Per photo provided by her son.  Drain in place with surrounding skin breakdown.     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 Mrs. Raider  completed 4 cycles of paclitaxel carboplatin with reduction in Ca1 25-133.  December 20, 2017.  Underwent robotic assisted bilateral salpingo-oophorectomy omentectomy interval debulking on January 27, 2018.  Postop course complicated by sepsis with suspicion of perforation.  She underwent an exploratory laparotomy however the microperforation could not be identified.   A drain remains in place for management of the persistent low output sigmoid fistula.  Currently on antibiotics with poor appetite. Advised to restart remeron Temp checks three time daily.  Advised to call for fever or hypothermia Will follow-up in 2 weeks Discussed diet supplementation with ensure Consider CBC     .   [name], MD   Cc: Toney Reil, MD, [place]

## 2018-10-03 ENCOUNTER — Encounter: Payer: Self-pay | Admitting: Behavioral Health

## 2018-10-03 ENCOUNTER — Encounter (HOSPITAL_COMMUNITY): Payer: Self-pay | Admitting: Emergency Medicine

## 2018-10-03 ENCOUNTER — Other Ambulatory Visit: Payer: Self-pay

## 2018-10-03 ENCOUNTER — Emergency Department (HOSPITAL_COMMUNITY): Payer: Medicare PPO

## 2018-10-03 ENCOUNTER — Inpatient Hospital Stay (HOSPITAL_COMMUNITY)
Admission: EM | Admit: 2018-10-03 | Discharge: 2018-10-06 | DRG: 371 | Disposition: A | Discharge status: Home Health | Payer: Medicare PPO | Attending: Internal Medicine | Admitting: Internal Medicine

## 2018-10-03 DIAGNOSIS — Z800 Family history of malignant neoplasm of digestive organs: Secondary | ICD-10-CM

## 2018-10-03 DIAGNOSIS — Z8679 Personal history of other diseases of the circulatory system: Secondary | ICD-10-CM | POA: Diagnosis not present

## 2018-10-03 DIAGNOSIS — N739 Female pelvic inflammatory disease, unspecified: Secondary | ICD-10-CM

## 2018-10-03 DIAGNOSIS — D539 Nutritional anemia, unspecified: Secondary | ICD-10-CM | POA: Diagnosis present

## 2018-10-03 DIAGNOSIS — I5032 Chronic diastolic (congestive) heart failure: Secondary | ICD-10-CM | POA: Diagnosis present

## 2018-10-03 DIAGNOSIS — Z7982 Long term (current) use of aspirin: Secondary | ICD-10-CM

## 2018-10-03 DIAGNOSIS — E43 Unspecified severe protein-calorie malnutrition: Secondary | ICD-10-CM | POA: Diagnosis present

## 2018-10-03 DIAGNOSIS — Z801 Family history of malignant neoplasm of trachea, bronchus and lung: Secondary | ICD-10-CM

## 2018-10-03 DIAGNOSIS — Z7902 Long term (current) use of antithrombotics/antiplatelets: Secondary | ICD-10-CM | POA: Diagnosis not present

## 2018-10-03 DIAGNOSIS — K632 Fistula of intestine: Secondary | ICD-10-CM | POA: Diagnosis present

## 2018-10-03 DIAGNOSIS — Z79899 Other long term (current) drug therapy: Secondary | ICD-10-CM

## 2018-10-03 DIAGNOSIS — R1084 Generalized abdominal pain: Secondary | ICD-10-CM

## 2018-10-03 DIAGNOSIS — R531 Weakness: Secondary | ICD-10-CM

## 2018-10-03 DIAGNOSIS — Z8701 Personal history of pneumonia (recurrent): Secondary | ICD-10-CM

## 2018-10-03 DIAGNOSIS — G4730 Sleep apnea, unspecified: Secondary | ICD-10-CM | POA: Diagnosis present

## 2018-10-03 DIAGNOSIS — R627 Adult failure to thrive: Secondary | ICD-10-CM | POA: Diagnosis present

## 2018-10-03 DIAGNOSIS — J449 Chronic obstructive pulmonary disease, unspecified: Secondary | ICD-10-CM | POA: Diagnosis present

## 2018-10-03 DIAGNOSIS — R64 Cachexia: Secondary | ICD-10-CM | POA: Diagnosis present

## 2018-10-03 DIAGNOSIS — I341 Nonrheumatic mitral (valve) prolapse: Secondary | ICD-10-CM | POA: Diagnosis present

## 2018-10-03 DIAGNOSIS — F0390 Unspecified dementia without behavioral disturbance: Secondary | ICD-10-CM | POA: Diagnosis present

## 2018-10-03 DIAGNOSIS — E860 Dehydration: Secondary | ICD-10-CM | POA: Diagnosis present

## 2018-10-03 DIAGNOSIS — Z87891 Personal history of nicotine dependence: Secondary | ICD-10-CM

## 2018-10-03 DIAGNOSIS — K651 Peritoneal abscess: Secondary | ICD-10-CM | POA: Diagnosis present

## 2018-10-03 DIAGNOSIS — Z885 Allergy status to narcotic agent status: Secondary | ICD-10-CM

## 2018-10-03 DIAGNOSIS — I10 Essential (primary) hypertension: Secondary | ICD-10-CM | POA: Diagnosis present

## 2018-10-03 DIAGNOSIS — C482 Malignant neoplasm of peritoneum, unspecified: Secondary | ICD-10-CM | POA: Diagnosis present

## 2018-10-03 DIAGNOSIS — N179 Acute kidney failure, unspecified: Secondary | ICD-10-CM | POA: Diagnosis present

## 2018-10-03 DIAGNOSIS — C569 Malignant neoplasm of unspecified ovary: Secondary | ICD-10-CM | POA: Diagnosis present

## 2018-10-03 DIAGNOSIS — I110 Hypertensive heart disease with heart failure: Secondary | ICD-10-CM | POA: Diagnosis present

## 2018-10-03 DIAGNOSIS — Z8042 Family history of malignant neoplasm of prostate: Secondary | ICD-10-CM

## 2018-10-03 DIAGNOSIS — Z8049 Family history of malignant neoplasm of other genital organs: Secondary | ICD-10-CM

## 2018-10-03 DIAGNOSIS — Z803 Family history of malignant neoplasm of breast: Secondary | ICD-10-CM

## 2018-10-03 DIAGNOSIS — E785 Hyperlipidemia, unspecified: Secondary | ICD-10-CM | POA: Diagnosis present

## 2018-10-03 DIAGNOSIS — Z90711 Acquired absence of uterus with remaining cervical stump: Secondary | ICD-10-CM

## 2018-10-03 DIAGNOSIS — M1990 Unspecified osteoarthritis, unspecified site: Secondary | ICD-10-CM | POA: Diagnosis present

## 2018-10-03 DIAGNOSIS — C786 Secondary malignant neoplasm of retroperitoneum and peritoneum: Secondary | ICD-10-CM | POA: Diagnosis present

## 2018-10-03 DIAGNOSIS — Z8249 Family history of ischemic heart disease and other diseases of the circulatory system: Secondary | ICD-10-CM

## 2018-10-03 DIAGNOSIS — Z9221 Personal history of antineoplastic chemotherapy: Secondary | ICD-10-CM

## 2018-10-03 DIAGNOSIS — Z808 Family history of malignant neoplasm of other organs or systems: Secondary | ICD-10-CM

## 2018-10-03 DIAGNOSIS — Z681 Body mass index (BMI) 19 or less, adult: Secondary | ICD-10-CM

## 2018-10-03 DIAGNOSIS — Z8543 Personal history of malignant neoplasm of ovary: Secondary | ICD-10-CM

## 2018-10-03 LAB — URINALYSIS, ROUTINE W REFLEX MICROSCOPIC
Bilirubin Urine: NEGATIVE
Glucose, UA: NEGATIVE mg/dL
Hgb urine dipstick: NEGATIVE
Ketones, ur: NEGATIVE mg/dL
Leukocytes,Ua: NEGATIVE
Nitrite: NEGATIVE
Protein, ur: NEGATIVE mg/dL
Specific Gravity, Urine: 1.04 — ABNORMAL HIGH (ref 1.005–1.030)
pH: 7 (ref 5.0–8.0)

## 2018-10-03 LAB — CBC WITH DIFFERENTIAL/PLATELET
Abs Immature Granulocytes: 0.1 10*3/uL — ABNORMAL HIGH (ref 0.00–0.07)
Basophils Absolute: 0 10*3/uL (ref 0.0–0.1)
Basophils Relative: 0 %
Eosinophils Absolute: 0.4 10*3/uL (ref 0.0–0.5)
Eosinophils Relative: 3 %
HCT: 26.1 % — ABNORMAL LOW (ref 36.0–46.0)
Hemoglobin: 8.1 g/dL — ABNORMAL LOW (ref 12.0–15.0)
Lymphocytes Relative: 23 %
Lymphs Abs: 2.7 10*3/uL (ref 0.7–4.0)
MCH: 21.3 pg — ABNORMAL LOW (ref 26.0–34.0)
MCHC: 31 g/dL (ref 30.0–36.0)
MCV: 68.7 fL — ABNORMAL LOW (ref 80.0–100.0)
Monocytes Absolute: 0.6 10*3/uL (ref 0.1–1.0)
Monocytes Relative: 5 %
Myelocytes: 1 %
Neutro Abs: 8 10*3/uL — ABNORMAL HIGH (ref 1.7–7.7)
Neutrophils Relative %: 68 %
Platelets: 399 10*3/uL (ref 150–400)
RBC: 3.8 MIL/uL — ABNORMAL LOW (ref 3.87–5.11)
RDW: 19.9 % — ABNORMAL HIGH (ref 11.5–15.5)
WBC: 12.1 10*3/uL — ABNORMAL HIGH (ref 4.0–10.5)
nRBC: 0 % (ref 0.0–0.2)
nRBC: 0 /100 WBC

## 2018-10-03 LAB — COMPREHENSIVE METABOLIC PANEL
ALT: 24 U/L (ref 0–44)
AST: 21 U/L (ref 15–41)
Albumin: 2.4 g/dL — ABNORMAL LOW (ref 3.5–5.0)
Alkaline Phosphatase: 62 U/L (ref 38–126)
Anion gap: 13 (ref 5–15)
BUN: 27 mg/dL — ABNORMAL HIGH (ref 8–23)
CO2: 23 mmol/L (ref 22–32)
Calcium: 9.6 mg/dL (ref 8.9–10.3)
Chloride: 100 mmol/L (ref 98–111)
Creatinine, Ser: 1.21 mg/dL — ABNORMAL HIGH (ref 0.44–1.00)
GFR calc Af Amer: 51 mL/min — ABNORMAL LOW (ref >60)
GFR calc non Af Amer: 44 mL/min — ABNORMAL LOW (ref >60)
Glucose, Bld: 111 mg/dL — ABNORMAL HIGH (ref 70–99)
Potassium: 4.1 mmol/L (ref 3.5–5.1)
Sodium: 136 mmol/L (ref 135–145)
Total Bilirubin: 0.5 mg/dL (ref 0.3–1.2)
Total Protein: 7.7 g/dL (ref 6.5–8.1)

## 2018-10-03 LAB — LACTIC ACID, PLASMA: Lactic Acid, Venous: 1.7 mmol/L (ref 0.5–1.9)

## 2018-10-03 MED ORDER — SODIUM CHLORIDE 0.9 % IV SOLN
500.0000 mg | Freq: Every day | INTRAVENOUS | Status: DC
  Filled 2018-10-03 (×2): qty 0.5

## 2018-10-03 MED ORDER — SODIUM CHLORIDE 0.9 % IV SOLN
1.0000 g | Freq: Once | INTRAVENOUS | Status: AC
  Administered 2018-10-03: 1000 mg via INTRAVENOUS
  Filled 2018-10-03: qty 1

## 2018-10-03 MED ORDER — PIPERACILLIN-TAZOBACTAM 3.375 G IVPB 30 MIN: 3.3750 g | Freq: Once | INTRAVENOUS | Status: DC

## 2018-10-03 MED ORDER — SODIUM CHLORIDE 0.9 % IV BOLUS
500.0000 mL | Freq: Once | INTRAVENOUS | Status: AC
  Administered 2018-10-03: 14:00:00 500 mL via INTRAVENOUS

## 2018-10-03 MED ORDER — IOHEXOL 300 MG/ML  SOLN
75.0000 mL | Freq: Once | INTRAMUSCULAR | Status: AC | PRN
  Administered 2018-10-03: 75 mL via INTRAVENOUS

## 2018-10-03 MED ORDER — HYDROMORPHONE HCL 1 MG/ML IJ SOLN
0.5000 mg | INTRAMUSCULAR | Status: DC | PRN
  Administered 2018-10-03 – 2018-10-06 (×9): 0.5 mg via INTRAVENOUS
  Filled 2018-10-03 (×10): qty 1

## 2018-10-03 MED ORDER — VANCOMYCIN HCL IN DEXTROSE 1-5 GM/200ML-% IV SOLN: 1000.0000 mg | Freq: Once | INTRAVENOUS | Status: DC

## 2018-10-03 MED ORDER — ENOXAPARIN SODIUM 40 MG/0.4ML ~~LOC~~ SOLN
40.0000 mg | SUBCUTANEOUS | Status: DC
  Administered 2018-10-03 – 2018-10-05 (×3): 40 mg via SUBCUTANEOUS
  Filled 2018-10-03 (×3): qty 0.4

## 2018-10-03 MED ORDER — SODIUM CHLORIDE 0.9 % IV SOLN
INTRAVENOUS | Status: DC
  Administered 2018-10-03 – 2018-10-06 (×6): via INTRAVENOUS

## 2018-10-03 NOTE — ED Notes (Signed)
ED TO INPATIENT HANDOFF REPORT  ED Nurse Name and Phone #: Lindsay Straka [phone] Name/Age/Gender Jo Parker 73 y.o. female Room/Bed: 033C/033C  Code Status   Code Status: Full Code  Home/SNF/Other Home Patient oriented to: self, place, time and situation Is this baseline? Yes   Triage Complete: Triage complete  Chief Complaint Loss of appetite; Weakness  Triage Note Pt arrives from home with son with c/o of weakness and lethargic- recent surgery due to ovarian CA and pt currently has a drain in abd. Pt is alert and ox4 however son states pt has not been eating and has not been walking since Saturday.    Allergies Allergies  Allergen Reactions  . Oxycodone Other (See Comments)    confusion    Level of Care/Admitting Diagnosis ED Disposition    ED Disposition Condition Comment   Admit  Hospital Area: [place] [[id number]]  Level of Care: Med-Surg [16]  Covid Evaluation: N/A  Diagnosis: Intra-abdominal abscess [facility]) [[id number]]  Admitting Physician: Georgette Shell [[id number]]  Attending Physician: Georgette Shell [[id number]]  Estimated length of stay: 3 - 4 days  Certification:: I certify this patient will need inpatient services for at least 2 midnights  PT Class (Do Not Modify): Inpatient [101]  PT Acc Code (Do Not Modify): Private [1]       B Medical/Surgery History Past Medical History:  Diagnosis Date  . Acute on chronic respiratory failure with hypoxia ([place]) 03/07/2018  . Anxiety and depression   . Aortic aneurysm ([place])    see ov note 03/2017   . Arthritis   . Aspiration pneumonia ([place])   . 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 CHF (congestive heart failure) ([name])    diastolic dysfunction  . COPD (chronic obstructive pulmonary disease) ([place]) 10/23/2017  . Depression   . Family history of breast cancer   . Family history of prostate cancer   . Family history of uterine cancer   . History of diverticulitis   . HLD (hyperlipidemia) 10/23/2017   . Hypertension   . Mitral valve prolapse   . Pelvic abscess in female   . Sleep apnea    cpap machine mask and tubing in [place] per patient.  Patient does not have mask or tubing to bring on DOS of 01/27/2018.   Marland Kitchen Uterine cancer ([place])    had partial hysterectomy at 27   Past Surgical History:  Procedure Laterality Date  . ABDOMINAL HYSTERECTOMY    . cardiac monitor implant    . CEREBRAL ANEURYSM REPAIR    . CERVICAL SPINE SURGERY     bone spurs  . DEBULKING N/A 01/27/2018   Procedure: RADICAL DEBULKING;  Surgeon: [name], MD;  Location: [facility];  Service: Gynecology;  Laterality: N/A;  . IR CATHETER TUBE CHANGE  06/13/2018  . IR CATHETER TUBE CHANGE  09/28/2018  . IR FLUORO GUIDE PORT INSERTION RIGHT  10/14/2017  . IR PARACENTESIS  10/14/2017  . IR RADIOLOGIST EVAL & MGMT  04/26/2018  . IR RADIOLOGIST EVAL & MGMT  05/10/2018  . IR RADIOLOGIST EVAL & MGMT  05/25/2018  . IR RADIOLOGIST EVAL & MGMT  07/20/2018  . IR REMOVAL TUN ACCESS W/ PORT W/O FL MOD SED  07/02/2018  . IR US GUIDE VASC ACCESS RIGHT  10/14/2017  . LAPAROTOMY N/A 01/27/2018   Procedure: EXPLORATORY LAPAROTOMY WITH REMOVAL OF BILATERAL TUBES AND OVARIES;  Surgeon: [name], MD;  Location: [facility];  Service: Gynecology;  Laterality: N/A;  . LAPAROTOMY  N/A 01/31/2018   Procedure: EXPLORATORY LAPAROTOMY, ABDOMINAL WASHOUT;  Surgeon: Isabel Caprice, MD;  Location: [facility];  Service: Gynecology;  Laterality: N/A;  . OMENTECTOMY N/A 01/27/2018   Procedure: OMENTECTOMY;  Surgeon: [name], MD;  Location: [facility];  Service: Gynecology;  Laterality: N/A;  . PARTIAL HYSTERECTOMY  1971   "precancer"  . TEE WITHOUT CARDIOVERSION N/A 07/04/2018   Procedure: TRANSESOPHAGEAL ECHOCARDIOGRAM (TEE);  Surgeon: Sanda Klein, MD;  Location: [facility] ENDOSCOPY;  Service: Cardiovascular;  Laterality: N/A;     A IV Location/Drains/Wounds Patient Lines/Drains/Airways Status   Active Line/Drains/Airways    Name:   Placement date:    Placement time:   Site:   Days:   Peripheral IV 10/03/18 Right Antecubital   10/03/18    1410    Antecubital   less than 1   PICC Single Lumen 28/31/51 PICC Left Basilic 40 cm 0 cm   76/16/07    3710    Basilic   89   Closed System Drain 1 Left;Anterior Abdomen Other (Comment) 10 Fr.   09/28/18    1543    Abdomen   5   External Urinary Catheter   07/23/18    0625    -   72   Incision (Closed) 07/05/18 Chest Right;Upper   07/05/18    1035     90   Incision (Closed) 07/05/18 Abdomen Mid   07/05/18    1036     90          Intake/Output Last 24 hours  Intake/Output Summary (Last 24 hours) at 10/03/2018 1822 Last data filed at 10/03/2018 1523 Gross per 24 hour  Intake 500 ml  Output -  Net 500 ml    Labs/Imaging Results for orders placed or performed during the hospital encounter of 10/03/18 (from the past 48 hour(s))  Comprehensive metabolic panel     Status: Abnormal   Collection Time: 10/03/18  1:10 PM  Result Value Ref Range   Sodium 136 135 - 145 mmol/L   Potassium 4.1 3.5 - 5.1 mmol/L   Chloride 100 98 - 111 mmol/L   CO2 23 22 - 32 mmol/L   Glucose, Bld 111 (H) 70 - 99 mg/dL   BUN 27 (H) 8 - 23 mg/dL   Creatinine, Ser 1.21 (H) 0.44 - 1.00 mg/dL   Calcium 9.6 8.9 - 10.3 mg/dL   Total Protein 7.7 6.5 - 8.1 g/dL   Albumin 2.4 (L) 3.5 - 5.0 g/dL   AST 21 15 - 41 U/L   ALT 24 0 - 44 U/L   Alkaline Phosphatase 62 38 - 126 U/L   Total Bilirubin 0.5 0.3 - 1.2 mg/dL   GFR calc non Af Amer 44 (L) >60 mL/min   GFR calc Af Amer 51 (L) >60 mL/min   Anion gap 13 5 - 15    Comment: Performed at [facility], [address] [place], [place] [address]  Lactic acid, plasma     Status: None   Collection Time: 10/03/18  1:10 PM  Result Value Ref Range   Lactic Acid, Venous 1.7 0.5 - 1.9 mmol/L    Comment: Performed at [facility] [address] [place], [place] [address]  CBC with Differential     Status: Abnormal   Collection Time: 10/03/18  1:10 PM  Result Value Ref  Range   WBC 12.1 (H) 4.0 - 10.5 K/uL    Comment: REPEATED TO VERIFY WHITE COUNT CONFIRMED ON  SMEAR    RBC 3.80 (L) 3.87 - 5.11 MIL/uL   Hemoglobin 8.1 (L) 12.0 - 15.0 g/dL    Comment: Reticulocyte Hemoglobin testing may be clinically indicated, consider ordering this additional test [id number]    HCT 26.1 (L) 36.0 - 46.0 %   MCV 68.7 (L) 80.0 - 100.0 fL    Comment: REPEATED TO VERIFY   MCH 21.3 (L) 26.0 - 34.0 pg   MCHC 31.0 30.0 - 36.0 g/dL   RDW 19.9 (H) 11.5 - 15.5 %   Platelets 399 150 - 400 K/uL    Comment: REPEATED TO VERIFY SPECIMEN CHECKED FOR CLOTS    nRBC 0.0 0.0 - 0.2 %   Neutrophils Relative % 68 %   Neutro Abs 8.0 (H) 1.7 - 7.7 K/uL   Lymphocytes Relative 23 %   Lymphs Abs 2.7 0.7 - 4.0 K/uL   Monocytes Relative 5 %   Monocytes Absolute 0.6 0.1 - 1.0 K/uL   Eosinophils Relative 3 %   Eosinophils Absolute 0.4 0.0 - 0.5 K/uL   Basophils Relative 0 %   Basophils Absolute 0.0 0.0 - 0.1 K/uL   WBC Morphology See Note     Comment: Increased Bands. >20% Bands   nRBC 0 0 /100 WBC   Myelocytes 1 %   Abs Immature Granulocytes 0.10 (H) 0.00 - 0.07 K/uL   Acanthocytes PRESENT    Polychromasia PRESENT     Comment: Performed at [facility] [address], [place], [place] [address]  Urinalysis, Routine w reflex microscopic     Status: Abnormal   Collection Time: 10/03/18  4:49 PM  Result Value Ref Range   Color, Urine YELLOW YELLOW   APPearance CLEAR CLEAR   Specific Gravity, Urine 1.040 (H) 1.005 - 1.030   pH 7.0 5.0 - 8.0   Glucose, UA NEGATIVE NEGATIVE mg/dL   Hgb urine dipstick NEGATIVE NEGATIVE   Bilirubin Urine NEGATIVE NEGATIVE   Ketones, ur NEGATIVE NEGATIVE mg/dL   Protein, ur NEGATIVE NEGATIVE mg/dL   Nitrite NEGATIVE NEGATIVE   Leukocytes,Ua NEGATIVE NEGATIVE    Comment: Performed at [facility] [address] [place], [place] [address]   Ct Abdomen Pelvis W Contrast  Result Date: 10/03/2018 CLINICAL DATA:  Increased generalized  weakness. Recent surgery for ovarian cancer with subsequent percutaneous drainage of left pelvic abscess. EXAM: CT ABDOMEN AND PELVIS WITH CONTRAST TECHNIQUE: Multidetector CT imaging of the abdomen and pelvis was performed using the standard protocol following bolus administration of intravenous contrast. CONTRAST:  54mL OMNIPAQUE IOHEXOL 300 MG/ML  SOLN COMPARISON:  CT 07/21/2018. FINDINGS: Lower chest: Interval improved aeration of the lung bases with resolution of the right pleural effusion. Interval stenting of the thoracoabdominal aorta, further described below. Coronary artery atherosclerosis noted. Hepatobiliary: The liver is normal in density without suspicious focal abnormality. No evidence of gallstones, gallbladder wall thickening or biliary dilatation. Pancreas: Unremarkable. No pancreatic ductal dilatation or surrounding inflammatory changes. Spleen: Normal in size without focally suspicious finding. There are tiny low-density lesions posteriorly in the spleen on image 17/3 which are stable. Adrenals/Urinary Tract: Both adrenal glands appear normal. Stable small renal cysts, largest in the lower pole of the left kidney. No evidence of renal mass, urinary tract calculus or hydronephrosis. The bladder appears normal. Stomach/Bowel: Stable small periampullary duodenal diverticulum. The stomach and small bowel otherwise appear normal. There is a large amount of stool throughout the colon. There are loops of bowel which chronically protrude into postsurgical defects in the  mid abdominal wall. These appear stable without evidence of incarceration. Sigmoid colon diverticulosis and wall thickening are similar to the previous study. Vascular/Lymphatic: There are no enlarged abdominal or pelvic lymph nodes. Interval stent grafting of the distal thoracic and proximal abdominal aorta. The superior extent of the stent is not imaged. It extends inferiorly to the renal arteries. There are fenestrations and stents in  the proximal celiac trunk and superior mesenteric artery which appear patent. Contrast bolus is not optimal. The stent covers the previously demonstrated large saccular aneurysms which appear excluded from the aortic lumen. 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 No evidence of endograft leak. No new aneurysms are seen. The portal vein is patent. Reproductive: Hysterectomy.  No apparent adnexal mass. Other: Percutaneous left pelvic drain is in similar position, although was exchanged 5 days ago. Surrounding complex air-fluid collection with peripheral enhancement has enlarged, measuring 6.0 x 6.0 x 5.8 cm. The drain appears located in the posterior aspect of this collection. No separate or other enlarging pelvic fluid collections identified. Musculoskeletal: No acute or significant osseous findings. IMPRESSION: 1. Interval significant enlargement of left pelvic abscess. The percutaneous drain appears well located within this collection, suggesting a persistent associated colonic fistula. 2. No new intraperitoneal fluid collections or definite signs of metastatic disease identified. 3. Interval stenting of the distal thoracic and proximal abdominal aorta with exclusion of the previously demonstrated saccular aneurysms. The right-sided aneurysm has not significantly in size, although no endograft leak identified. Attention on follow-up recommended. 4.  Aortic Atherosclerosis (ICD10-I70.0). Electronically Signed   By: Richardean Sale M.D.   On: 10/03/2018 16:06    Pending Labs Unresulted Labs (From admission, onward)    Start     Ordered   10/04/18 0500  CBC  Tomorrow morning,   R     10/03/18 1807   10/04/18 0500  Comprehensive metabolic panel  Tomorrow morning,   R     10/03/18 1807   10/03/18 1311  Culture, blood (routine x 2)  BLOOD CULTURE X 2,   STAT      10/03/18 1311          Vitals/Pain Today's Vitals   10/03/18 1410 10/03/18 1521 10/03/18 1600 10/03/18 1745  BP:  (!) 139/49 (!) 111/56 (!) 125/52  Pulse:  84 79 78  Resp:  20 18 19   Temp:      SpO2:  98% 100% 100%  PainSc: 2        Isolation Precautions No active isolations  Medications Medications  ertapenem (INVANZ) 1,000 mg in sodium chloride 0.9 % 100 mL IVPB (1,000 mg Intravenous New Bag/Given 10/03/18 1813)  enoxaparin (LOVENOX) injection 40 mg (has no administration in time range)  HYDROmorphone (DILAUDID) injection 0.5 mg (has no administration in time range)  sodium chloride 0.9 % bolus 500 mL (0 mLs Intravenous Stopped 10/03/18 1523)  iohexol (OMNIPAQUE) 300 MG/ML solution 75 mL (75 mLs Intravenous Contrast Given 10/03/18 1510)    Mobility walks with device Moderate fall risk   Focused Assessments   R Recommendations: See Admitting Provider Note  Report given to:   Additional Notes:

## 2018-10-03 NOTE — ED Provider Notes (Signed)
73 yo F with a chief complaints of generalized fatigue and now unable to complete her ADLs without significant assistance.  This been going on for about a week.  She has had a fever couple days ago that resolved.  She unfortunately has a history of ovarian cancer and currently has a drain placed for a pelvic abscess.  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  The patient was seen in the general surgeon's office Dr. Leighton Ruff about a week ago.  At that time her drain was removed and replaced.  It continues to drain fluid but not a large amount.  I received the patient in signout the plan is for a CT scan of the abdomen pelvis with contrast to evaluate this collection.  CT scan is returned and the collection is somewhat larger than previous.  I discussed the case with Dr. Marlou Starks, general surgery he felt it would be reasonable to broaden the patient's antibiotics and have her placed into the hospital and she would be evaluated by surgery in the morning.   Deno Etienne, DO 10/03/18 1707

## 2018-10-03 NOTE — Progress Notes (Signed)
Pharmacy Antibiotic Note  Jo Parker is a 73 y.o. female admitted on 10/03/2018 for recurrent abdominal pain and fever with Intra-abdominal infection. Patient has a drain placed by internetional radiology that is draining yellow colored liquid for several days and has h/o pelvic abscess with colonic fistula. CT abdomen shows evidence of significant enlargement of the left pelvic abscess. Patient was previously treated with 6 weeks of Invanz and fluconazole. Pharmacy has been consulted for Invanz dosing. Current Scr 1.21 (BL 0.7) with CrCl 30.7.   Plan: Invanz 1000mg  x1 dose given on 4/27 Start Invanz 500mg  every 24 hours  Monitor renal function and cultures  Follow up on antibiotic length of therapy     Temp (24hrs), Avg:98 F (36.7 C), Min:97.6 F (36.4 C), Max:98.3 F (36.8 C)  Recent Labs  Lab 10/03/18 1310  WBC 12.1*  CREATININE 1.21*  LATICACIDVEN 1.7    CrCl cannot be calculated (Unknown ideal weight.).    Allergies  Allergen Reactions  . Oxycodone Other (See Comments)    confusion    Antimicrobials this admission: Invanz >>   Microbiology results: 4/27 BCx: sent  Thank you for allowing pharmacy to be a part of this patient's care.  Gwenlyn Found, Sherian Rein D PGY1 Pharmacy Resident  Phone [phone] 10/03/2018   7:48 PM

## 2018-10-03 NOTE — ED Notes (Signed)
Pt transported to CT scan.

## 2018-10-03 NOTE — H&P (Addendum)
History and Physical    Jo Parker WNI:[id number] DOB: Jun 01, 1946 DOA: 10/03/2018  PCP: [name], MD Dr. 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  Patient still has a drain in place son reports a drain was placed by interventional radiology at [facility].  It is draining yellow color liquid has been that way for many days.  Patient was treated with 6 weeks of Invanz and fluconazole.  Patient also has pelvic abscess with colonic fistula.  Son reports that patient has lost significant amount of weight with decreased appetite generalized weakness unable to ambulate with no nausea or vomiting or diarrhea.  Denies any chest pain shortness of breath or cough.  Patient was admitted to the hospital 08/05/2018 discharged 08/10/2018 discharged on fluconazole and ertapenem last date 09/09/2018  ED Course: received invanz.  CT of the abdomen and pelvis showed significant enlargement of the left pelvic abscess.  Percutaneous drain appears well located within this collection staying a persistent associated colonic fistula.  No new intraperitoneal fluid collections or definite signs of metastatic disease.  Will stenting of the distal thoracic and proximal abdominal aorta with exclusion of the previously demonstrated saccular aneurysm.   Echo 07/04/2018 no evidence of endocarditis Review of Systems see HPI Ambulatory Status: Patient baseline walks inside the house very small distances.  Past Medical History:  Diagnosis Date  . Acute on chronic respiratory failure with hypoxia ([place]) 03/07/2018  . Anxiety and depression   . Aortic aneurysm ([place])     see ov note 03/2017   . Arthritis   . Aspiration pneumonia ([place])   . Carcinomatosis ([place]) 10/2017  . CHF (congestive heart failure) ([name])    diastolic dysfunction  . COPD (chronic obstructive pulmonary disease) ([place]) 10/23/2017  . Depression   . Family history of breast cancer   . Family history of prostate cancer   . Family history of uterine cancer   . History of diverticulitis   . HLD (hyperlipidemia) 10/23/2017  . Hypertension   . Mitral valve prolapse   . Pelvic abscess in female   . Sleep apnea    cpap machine mask and tubing in [place] per patient.  Patient does not have mask or tubing to bring on DOS of 01/27/2018.   Marland Kitchen Uterine cancer ([place])    had partial hysterectomy at 27    Past Surgical History:  Procedure Laterality Date  . ABDOMINAL HYSTERECTOMY    . cardiac monitor implant    . CEREBRAL ANEURYSM REPAIR    . CERVICAL SPINE SURGERY     bone spurs  . DEBULKING N/A 01/27/2018   Procedure: RADICAL DEBULKING;  Surgeon: [name], MD;  Location: [facility];  Service: Gynecology;  Laterality: N/A;  . IR CATHETER TUBE CHANGE  06/13/2018  . IR CATHETER TUBE CHANGE  09/28/2018  . IR FLUORO GUIDE PORT INSERTION RIGHT  10/14/2017  . IR PARACENTESIS  10/14/2017  . IR RADIOLOGIST EVAL & MGMT  04/26/2018  . IR RADIOLOGIST EVAL & MGMT  05/10/2018  . IR RADIOLOGIST EVAL & MGMT  05/25/2018  . IR RADIOLOGIST EVAL & MGMT  07/20/2018  . IR REMOVAL TUN ACCESS W/ PORT W/O FL MOD SED  07/02/2018  . IR US  GUIDE VASC ACCESS RIGHT  10/14/2017  . LAPAROTOMY N/A 01/27/2018   Procedure: EXPLORATORY LAPAROTOMY WITH REMOVAL OF BILATERAL TUBES AND OVARIES;  Surgeon: [name], MD;  Location: [facility];  Service: Gynecology;  Laterality: N/A;  . LAPAROTOMY N/A 01/31/2018   Procedure: EXPLORATORY LAPAROTOMY, ABDOMINAL WASHOUT;  Surgeon: Isabel Caprice, MD;  Location: [facility];  Service: Gynecology;  Laterality: N/A;  . OMENTECTOMY N/A 01/27/2018   Procedure: OMENTECTOMY;  Surgeon: [name], MD;   Location: [facility];  Service: Gynecology;  Laterality: N/A;  . PARTIAL HYSTERECTOMY  1971   "precancer"  . TEE WITHOUT CARDIOVERSION N/A 07/04/2018   Procedure: TRANSESOPHAGEAL ECHOCARDIOGRAM (TEE);  Surgeon: Sanda Klein, MD;  Location[facility] ENDOSCOPY;  Service: Cardiovascular;  Laterality: N/A;    Social History   Socioeconomic History  . Marital status: Married    Spouse name: Not on file  . Number of children: 3  . Years of education: Not on file  . Highest education level: Not on file  Occupational History  . Occupation: retired  Scientific laboratory technician  . Financial resource strain: Not on file  . Food insecurity:    Worry: Not on file    Inability: Not on file  . Transportation needs:    Medical: Not on file    Non-medical: Not on file  Tobacco Use  . Smoking status: Former Smoker    Packs/day: 1.00    Years: 55.00    Pack years: 55.00    Last attempt to quit: 10/06/2017    Years since quitting: 0.9  . Smokeless tobacco: Never Used  Substance and Sexual Activity  . Alcohol use: Never    Frequency: Never  . Drug use: Never  . Sexual activity: Not Currently  Lifestyle  . Physical activity:    Days per week: Not on file    Minutes per session: Not on file  . Stress: Not on file  Relationships  . Social connections:    Talks on phone: Not on file    Gets together: Not on file    Attends religious service: Not on file    Active member of club or organization: Not on file    Attends meetings of clubs or organizations: Not on file    Relationship status: Not on file  . Intimate partner violence:    Fear of current or ex partner: Not on file    Emotionally abused: Not on file    Physically abused: Not on file    Forced sexual activity: Not on file  Other Topics Concern  . Not on file  Social History Narrative  . Not on file    Allergies  Allergen Reactions  . Oxycodone Other (See Comments)    confusion    Family History  Problem Relation Age of Onset  .  Endometrial cancer Sister 1       endometrial ca  . Endometrial cancer Paternal Aunt 80  . Breast cancer Paternal Aunt   . Stomach cancer Maternal Grandfather        d. 4s  . Prostate cancer Paternal Uncle   . Breast cancer Cousin        pat first cousin  . Breast cancer Cousin        pat first cousin's daughter  . Lung cancer Father        lung ca  . Heart disease Mother   . Stroke Paternal Grandmother   . Endometrial cancer Cousin  paternal first cousin's daughter  . Breast cancer Other        niece dx less than 45s  . Breast cancer Other        niece, dx under 79      Prior to Admission medications   Medication Sig Start Date End Date Taking? Authorizing Provider  acetaminophen (TYLENOL) 500 MG tablet Take 500 mg by mouth every 6 (six) hours as needed for mild pain or headache.   Yes [provider]  amoxicillin-clavulanate (AUGMENTIN) 875-125 MG tablet Take 1 tablet by mouth 2 (two) times daily. 09/06/18  Yes Comer, Okey Regal, MD  aspirin 81 MG chewable tablet Chew 81 mg by mouth daily. 09/01/18  Yes [provider]  carvedilol (COREG) 25 MG tablet Take 25 mg by mouth 2 (two) times daily. 09/19/18 10/19/18 Yes [provider]  clopidogrel (PLAVIX) 75 MG tablet Take 75 mg by mouth daily.   Yes [provider]  feeding supplement (BOOST HIGH PROTEIN) LIQD Take 1 Container by mouth 2 (two) times daily between meals.   Yes [provider]  fluconazole (DIFLUCAN) 200 MG tablet Take 1 tablet (200 mg total) by mouth daily. 09/06/18  Yes Comer, Okey Regal, MD  magnesium oxide (MAG-OX) 400 MG tablet Take 400 mg by mouth 2 (two) times daily.    Yes [provider]  mirtazapine (REMERON) 30 MG tablet Take 1 tablet (30 mg total) by mouth at bedtime. Patient taking differently: Take 15 mg by mouth at bedtime. Taking  1/2 tablet at bedtime 05/13/18  Yes [name], MD  Pediatric Multiple Vit-C-FA (MULTIVITAMIN ANIMAL SHAPES, WITH CA/FA,)  with C & FA chewable tablet Chew 1 tablet by mouth daily.   Yes [provider]  traMADol (ULTRAM) 50 MG tablet Take 1 tablet (50 mg total) by mouth every 6 (six) hours as needed for moderate pain. 08/10/18  Yes Ghimire, Henreitta Leber, MD  feeding supplement, ENSURE ENLIVE, (ENSURE ENLIVE) LIQD Take 237 mLs by mouth 2 (two) times daily between meals. Patient not taking: Reported on 10/03/2018 08/10/18   Jonetta Osgood, MD  megestrol (MEGACE) 400 MG/10ML suspension Take 10 mLs (400 mg total) by mouth daily. Patient not taking: Reported on 10/03/2018 08/10/18   Jonetta Osgood, MD    Physical Exam: Vitals:   10/03/18 1239 10/03/18 1345 10/03/18 1521 10/03/18 1600  BP: (!) 99/42 (!) 106/54 (!) 139/49 (!) 111/56  Pulse: 78 72 84 79  Resp:  18 20 18   Temp: 97.6 F (36.4 C)     SpO2: 100% 99% 98% 100%     . General: Appears cachectic elderly chronically sick looking . Eyes: PERRL, EOMI, normal lids, iris . ENT: grossly normal hearing, lips & tongue, dry mucous membrane . Neck: no LAD, masses or thyromegaly . Cardiovascular: RRR, no m/r/g. No LE edema.  Marland Kitchen Respiratory:  CTA bilaterally, no w/r/r. Normal respiratory effort. . Abdomen: soft, drain in place with approximately 25 cc of yellow purulent drainage and surgical scar noted . Skin:  no rash or induration seen on limited exam . Musculoskeletal:  grossly normal tone BUE/BLE, good ROM, no bony abnormality  Labs on Admission: I have personally reviewed following labs and imaging studies  CBC: Recent Labs  Lab 10/03/18 1310  WBC 12.1*  NEUTROABS 8.0*  HGB 8.1*  HCT 26.1*  MCV 68.7*  PLT 427   Basic Metabolic Panel: Recent Labs  Lab 10/03/18 1310  NA 136  K 4.1  CL 100  CO2  23  GLUCOSE 111*  BUN 27*  CREATININE 1.21*  CALCIUM 9.6   GFR: CrCl cannot be calculated (Unknown ideal weight.). Liver Function Tests: Recent Labs  Lab 10/03/18 1310  AST 21  ALT 24  ALKPHOS 62  BILITOT 0.5  PROT 7.7  ALBUMIN 2.4*    No results for input(s): LIPASE, AMYLASE in the last 168 hours. No results for input(s): AMMONIA in the last 168 hours. Coagulation Profile: No results for input(s): INR, PROTIME in the last 168 hours. Cardiac Enzymes: No results for input(s): CKTOTAL, CKMB, CKMBINDEX, TROPONINI in the last 168 hours. BNP (last 3 results) No results for input(s): PROBNP in the last 8760 hours. HbA1C: No results for input(s): HGBA1C in the last 72 hours. CBG: No results for input(s): GLUCAP in the last 168 hours. Lipid Profile: No results for input(s): CHOL, HDL, LDLCALC, TRIG, CHOLHDL, LDLDIRECT in the last 72 hours. Thyroid Function Tests: No results for input(s): TSH, T4TOTAL, FREET4, T3FREE, THYROIDAB in the last 72 hours. Anemia Panel: No results for input(s): VITAMINB12, FOLATE, FERRITIN, TIBC, IRON, RETICCTPCT in the last 72 hours. Urine analysis:    Component Value Date/Time   COLORURINE YELLOW 10/03/2018 1649   APPEARANCEUR CLEAR 10/03/2018 1649   LABSPEC 1.040 (H) 10/03/2018 1649   PHURINE 7.0 10/03/2018 1649   GLUCOSEU NEGATIVE 10/03/2018 1649   HGBUR NEGATIVE 10/03/2018 1649   BILIRUBINUR NEGATIVE 10/03/2018 1649   KETONESUR NEGATIVE 10/03/2018 1649   PROTEINUR NEGATIVE 10/03/2018 1649   NITRITE NEGATIVE 10/03/2018 1649   LEUKOCYTESUR NEGATIVE 10/03/2018 1649    Creatinine Clearance: CrCl cannot be calculated (Unknown ideal weight.).  Sepsis Labs: @LABRCNTIP (procalcitonin:4,lacticidven:4) )No results found for this or any previous visit (from the past 240 hour(s)).   Radiological Exams on Admission: Ct Abdomen Pelvis W Contrast  Result Date: 10/03/2018 CLINICAL DATA:  Increased generalized weakness. Recent surgery for ovarian cancer with subsequent percutaneous drainage of left pelvic abscess. EXAM: CT ABDOMEN AND PELVIS WITH CONTRAST TECHNIQUE: Multidetector CT imaging of the abdomen and pelvis was performed using the standard protocol following bolus administration of  intravenous contrast. CONTRAST:  59mL OMNIPAQUE IOHEXOL 300 MG/ML  SOLN COMPARISON:  CT 07/21/2018. FINDINGS: Lower chest: Interval improved aeration of the lung bases with resolution of the right pleural effusion. Interval stenting of the thoracoabdominal aorta, further described below. Coronary artery atherosclerosis noted. Hepatobiliary: The liver is normal in density without suspicious focal abnormality. No evidence of gallstones, gallbladder wall thickening or biliary dilatation. Pancreas: Unremarkable. No pancreatic ductal dilatation or surrounding inflammatory changes. Spleen: Normal in size without focally suspicious finding. There are tiny low-density lesions posteriorly in the spleen on image 17/3 which are stable. Adrenals/Urinary Tract: Both adrenal glands appear normal. Stable small renal cysts, largest in the lower pole of the left kidney. No evidence of renal mass, urinary tract calculus or hydronephrosis. The bladder appears normal. Stomach/Bowel: Stable small periampullary duodenal diverticulum. The stomach and small bowel otherwise appear normal. There is a large amount of stool throughout the colon. There are loops of bowel which chronically protrude into postsurgical defects in the mid abdominal wall. These appear stable without evidence of incarceration. Sigmoid colon diverticulosis and wall thickening are similar to the previous study. Vascular/Lymphatic: There are no enlarged abdominal or pelvic lymph nodes. Interval stent grafting of the distal thoracic and proximal abdominal aorta. The superior extent of the stent is not imaged. It extends inferiorly to the renal arteries. There are fenestrations and stents in the proximal celiac trunk and superior mesenteric artery which appear  patent. Contrast bolus is not optimal. The stent covers the previously demonstrated large saccular aneurysms which appear excluded from the aortic lumen. The right-sided saccular aneurysm near the diaphragmatic  hiatus has not significantly changed in size, measuring 5.1 x 3.6 cm transverse on image 7/3 and extending 6.7 cm on coronal image 44/6. No evidence of endograft leak. No new aneurysms are seen. The portal vein is patent. Reproductive: Hysterectomy.  No apparent adnexal mass. Other: Percutaneous left pelvic drain is in similar position, although was exchanged 5 days ago. Surrounding complex air-fluid collection with peripheral enhancement has enlarged, measuring 6.0 x 6.0 x 5.8 cm. The drain appears located in the posterior aspect of this collection. No separate or other enlarging pelvic fluid collections identified. Musculoskeletal: No acute or significant osseous findings. IMPRESSION: 1. Interval significant enlargement of left pelvic abscess. The percutaneous drain appears well located within this collection, suggesting a persistent associated colonic fistula. 2. No new intraperitoneal fluid collections or definite signs of metastatic disease identified. 3. Interval stenting of the distal thoracic and proximal abdominal aorta with exclusion of the previously demonstrated saccular aneurysms. The right-sided aneurysm has not significantly in size, although no endograft leak identified. Attention on follow-up recommended. 4.  Aortic Atherosclerosis (ICD10-I70.0). Electronically Signed   By: Richardean Sale M.D.   On: 10/03/2018 16:06     Assessment/Plan Active Problems:   * No active hospital problems. 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  Still of course was complicated by sepsis with suspicion for perforation.  She went underwent exploratory laparotomy with no findings of microperforation.  And chemotherapy-patient had prolonged hospitalization from the beginning of this year to  [facility] long ICU.  Eventually patient was discharged home on Invanz and fluconazole for a total of 6 weeks.  In between patient had diagnosis of mycotic aortic aneurysm at [facility] and had stent placed in the SMA.  She was on Augmentin at home on a chronic basis for this reason.  The CT scan done this admission shows enlargement of the left pelvic abscess with a drain in place.  ER physician spoke with surgery on call and they should be seeing the patient today.  Meanwhile I will keep her n.p.o. start IV fluids and continue Invanz obtain blood cultures.  Patient was seen by GYN oncology 09/29/2018 for pelvic fluid collection.  Will consult infectious disease Dr. Marcello Moores has been informed from the ER.  Patient had drain replaced 09/28/2018 her prior old drain was completely dislodged 30 mL of purulent fluid was drained at the time of replacement of drain.  Will consult infectious disease and GYN oncology.  Patient is not a surgical candidate and surgery had recommended slowly pulling back the drain until the fistula heals.  Will consult IR.  DW ID WOULD NOT START ANY ANTIBIOTICS SINCE WE HAVE NO CULTURE DATA FROM THIS ADMIT.IR PLEASE SEND FLUID FOR CULTURE WHEN CHANGING DRAIN TODAY..AWAIT SURGERY INPUT.Nuala Alpha CONSULTED BY EDP.  #2 AKI secondary to decreased p.o. intake and dehydration we will start IV fluids and follow-up labs tomorrow.  #3 hypertension her blood pressure is soft I will hold p.o. medications.  #4 FTT she has very decreased appetite with low albumin and on going intraabdominal abscess..she should be a hospice candidate but family refuses even palliative care consult..     Nutrition Interventions:   Estimated  body mass index is 17.36 kg/m as calculated from the following:   Height as of 08/06/18: 5\' 3"  (1.6 m).   Weight as of 09/06/18: 44.5 kg.   DVT prophylaxis: lovenox Code Status: Full code discussed with son [name] he refused palliative care consult family Communication:  Discussed with son [name] Disposition Plan: Pending clinical improvement Consults called: Will call ID IR general surgery has been informed by the ED physician Admission status:  Inpatient  Georgette Shell MD [facility]  If 7PM-7AM, please contact night-coverage [email or web address] Password [facility]  10/03/2018, 5:38 PM

## 2018-10-03 NOTE — ED Triage Notes (Signed)
Pt arrives from home with son with c/o of weakness and lethargic- recent surgery due to ovarian CA and pt currently has a drain in abd. Pt is alert and ox4 however son states pt has not been eating and has not been walking since Saturday.

## 2018-10-03 NOTE — Progress Notes (Signed)
Pt arrived to room 6N07 via stretcher. Received report from [place], RN in ED. Will continue to monitor.

## 2018-10-03 NOTE — ED Provider Notes (Signed)
Emergency Department Provider Note   I have reviewed the triage vital signs and the nursing notes.   HISTORY  Chief Complaint Weakness   HPI Jo Parker is a 73 y.o. female with PMH of ovarian cancer, AAA s/p repair, abdominal wall abscess with percutaneous drain, COPD, CHF, and HTN presents to the ED with abdominal pain and weakness. The patient's son is at bedside for additional history.  He states that over the past 2 weeks the patient has had issues with her percutaneous drain.  They have been to interventional radiology twice with drain replacement and adjustment.  She is now having some drainage into her bag but the son states that she is complaining of worsening abdominal pain and generalized weakness.  In the past she has had infection related to similar symptomatology and so he brought her to the emergency department.  She is not experiencing any shortness of breath, cough, chest pain.  No vomiting or diarrhea.  She has had decreased appetite and does feel like eating makes her abdominal pain worse.  Her abdominal pain is diffuse.  The son questions possible fever several days ago but none since.  He dysuria, hesitancy, urgency symptoms.  She is currently on Augmentin and recently finished a course of ertapenem.   Past Medical History:  Diagnosis Date  . Acute on chronic respiratory failure with hypoxia ([place]) 03/07/2018  . Anxiety and depression   . Aortic aneurysm ([place])    see ov note 03/2017   . Arthritis   . Aspiration pneumonia ([place])   . Carcinomatosis ([place]) 10/2017  . CHF (congestive heart failure) ([name])    diastolic dysfunction  . COPD (chronic obstructive pulmonary disease) ([place]) 10/23/2017  . Depression   . Family history of breast cancer   . Family history of prostate cancer   . Family history of uterine cancer   . History of diverticulitis   . HLD (hyperlipidemia) 10/23/2017  . Hypertension   . Mitral valve prolapse   . Pelvic abscess in female   . Sleep  apnea    cpap machine mask and tubing in [place] per patient.  Patient does not have mask or tubing to bring on DOS of 01/27/2018.   Marland Kitchen Uterine cancer [facility])    had partial hysterectomy at 27    Patient Active Problem List   Diagnosis Date Noted  . Intra-abdominal abscess ([place]) 09/06/2018  . Palliative care encounter   . 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 Aorta aneurysm ([place]) 08/05/2018  . 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 Anemia 06/30/2018  . Weakness generalized 06/30/2018  . Preventive measure 04/05/2018  . Dehydration 03/30/2018  . Hypokalemia 03/30/2018  . Tracheostomy care ([place])   . Wound dehiscence, surgical, initial encounter   . Status post bilateral salpingo-oophorectomy 02/09/2018  . Pressure injury of skin 02/05/2018  . Genetic testing 02/01/2018  . BRCA2 gene mutation positive 02/01/2018  . Free intraperitoneal air   . Low urine output   . Somnolence   . At risk for fluid volume overload   . Ovarian cancer ([place]) 01/27/2018  . History of uterine cancer 01/26/2018  . Family history of breast cancer   . Family history of uterine cancer   . Family history of prostate cancer   . 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 Anemia due to antineoplastic chemotherapy 12/20/2017  . Essential hypertension 12/20/2017  . Hypomagnesemia   . Absolute anemia   . Malnutrition of moderate  degree 10/26/2017  . COPD (chronic obstructive pulmonary disease) ([place]) 10/23/2017  . HLD (hyperlipidemia) 10/23/2017  . Goals of care, counseling/discussion 10/13/2017  . Deficiency anemia 10/11/2017  . Iron deficiency anemia 10/08/2017  . Severe protein-calorie malnutrition ([place]) 10/08/2017  . 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 ABDOMINAL HYSTERECTOMY    . cardiac monitor implant    . CEREBRAL ANEURYSM REPAIR    . CERVICAL SPINE SURGERY     bone spurs  . DEBULKING  N/A 01/27/2018   Procedure: RADICAL DEBULKING;  Surgeon: [name], MD;  Location: [facility];  Service: Gynecology;  Laterality: N/A;  . IR CATHETER TUBE CHANGE  06/13/2018  . IR CATHETER TUBE CHANGE  09/28/2018  . IR FLUORO GUIDE PORT INSERTION RIGHT  10/14/2017  . IR PARACENTESIS  10/14/2017  . IR RADIOLOGIST EVAL & MGMT  04/26/2018  . IR RADIOLOGIST EVAL & MGMT  05/10/2018  . IR RADIOLOGIST EVAL & MGMT  05/25/2018  . IR RADIOLOGIST EVAL & MGMT  07/20/2018  . IR REMOVAL TUN ACCESS W/ PORT W/O FL MOD SED  07/02/2018  . IR US GUIDE VASC ACCESS RIGHT  10/14/2017  . LAPAROTOMY N/A 01/27/2018   Procedure: EXPLORATORY LAPAROTOMY WITH REMOVAL OF BILATERAL TUBES AND OVARIES;  Surgeon: [name], MD;  Location: [facility];  Service: Gynecology;  Laterality: N/A;  . LAPAROTOMY N/A 01/31/2018   Procedure: EXPLORATORY LAPAROTOMY, ABDOMINAL WASHOUT;  Surgeon: Isabel Caprice, MD;  Location: [facility];  Service: Gynecology;  Laterality: N/A;  . OMENTECTOMY N/A 01/27/2018   Procedure: OMENTECTOMY;  Surgeon: [name], MD;  Location: [facility];  Service: Gynecology;  Laterality: N/A;  . PARTIAL HYSTERECTOMY  1971   "precancer"  . TEE WITHOUT CARDIOVERSION N/A 07/04/2018   Procedure: TRANSESOPHAGEAL ECHOCARDIOGRAM (TEE);  Surgeon: Sanda Klein, MD;  Location[facility] ENDOSCOPY;  Service: Cardiovascular;  Laterality: N/A;    Allergies Oxycodone  Family History  Problem Relation Age of Onset  . Endometrial cancer Sister 17       endometrial ca  . Endometrial cancer Paternal Aunt 80  . Breast cancer Paternal Aunt   . Stomach cancer Maternal Grandfather        d. 4s  . Prostate cancer Paternal Uncle   . Breast cancer Cousin        pat first cousin  . Breast cancer Cousin        pat first cousin's daughter  . Lung cancer Father        lung ca  . Heart disease Mother   . Stroke Paternal Grandmother   . Endometrial cancer Cousin        paternal first cousin's daughter  . Breast cancer Other        niece  dx less than 86s  . Breast cancer Other        niece, dx under 15    Social History Social History   Tobacco Use  . Smoking status: Former Smoker    Packs/day: 1.00    Years: 55.00    Pack years: 55.00    Last attempt to quit: 10/06/2017    Years since quitting: 0.9  . Smokeless tobacco: Never Used  Substance Use Topics  . Alcohol use: Never    Frequency: Never  . Drug use: Never    Review of Systems  Constitutional: No fever/chills. Positive weakness.  Eyes: No visual changes. ENT: No sore throat. Cardiovascular: Denies chest pain. Respiratory: Denies shortness  of breath. Gastrointestinal: Positive abdominal pain.  No nausea, no vomiting.  No diarrhea.  No constipation. Genitourinary: Negative for dysuria. Musculoskeletal: Negative for back pain. Skin: Negative for rash. Neurological: Negative for headaches, focal weakness or numbness.  10-point ROS otherwise negative.  ____________________________________________   PHYSICAL EXAM:  VITAL SIGNS: ED Triage Vitals [10/03/18 1239]  Enc Vitals Group     BP (!) 99/42     Pulse Rate 78     Resp      Temp 97.6 F (36.4 C)     Temp src      SpO2 100 %     Pain Score 4   Constitutional: Alert and oriented. Well appearing and in no acute distress. Eyes: Conjunctivae are normal.  Head: Atraumatic. Nose: No congestion/rhinnorhea. Mouth/Throat: Mucous membranes are moist.  Neck: No stridor.   Cardiovascular: Normal rate, regular rhythm. Good peripheral circulation. Grossly normal heart sounds.   Respiratory: Normal respiratory effort.  No retractions. Lungs CTAB. Gastrointestinal: Soft with diffuse moderate tenderness. No rebound or guarding. Percutaneous drain in place without surrounding cellulitis or obvious abscess. No distention.  Musculoskeletal: No lower extremity tenderness nor edema. No gross deformities of extremities. Neurologic:  Normal speech and language. No gross focal neurologic deficits are  appreciated.  Skin:  Skin is warm, dry and intact. No rash noted.  ____________________________________________   LABS (all labs ordered are listed, but only abnormal results are displayed)  Labs Reviewed  COMPREHENSIVE METABOLIC PANEL - Abnormal; Notable for the following components:      Result Value   Glucose, Bld 111 (*)    BUN 27 (*)    Creatinine, Ser 1.21 (*)    Albumin 2.4 (*)    GFR calc non Af Amer 44 (*)    GFR calc Af Amer 51 (*)    All other components within normal limits  CBC WITH DIFFERENTIAL/PLATELET - Abnormal; Notable for the following components:   WBC 12.1 (*)    RBC 3.80 (*)    Hemoglobin 8.1 (*)    HCT 26.1 (*)    MCV 68.7 (*)    MCH 21.3 (*)    RDW 19.9 (*)    Neutro Abs 8.0 (*)    Abs Immature Granulocytes 0.10 (*)    All other components within normal limits  URINALYSIS, ROUTINE W REFLEX MICROSCOPIC - Abnormal; Notable for the following components:   Specific Gravity, Urine 1.040 (*)    All other components within normal limits  CULTURE, BLOOD (ROUTINE X 2)  CULTURE, BLOOD (ROUTINE X 2)  LACTIC ACID, PLASMA  CBC  COMPREHENSIVE METABOLIC PANEL   ____________________________________________  EKG  Rate: 74 PR: 138 QTc: 430  Sinus rhythm. LAFB. Non-specific ST changes. No STEMI.  ____________________________________________  RADIOLOGY  CT abdomen/pelvis pending ____________________________________________   PROCEDURES  Procedure(s) performed:   Procedures  None ____________________________________________   INITIAL IMPRESSION / ASSESSMENT AND PLAN / ED COURSE  Pertinent labs & imaging results that were available during my care of the patient were reviewed by me and considered in my medical decision making (see chart for details).   Patient presents to the emergency department with abdominal pain, decreased appetite, weakness.  Patient with percutaneous drain in the lower abdomen.  Recent CT from February shows that the fluid  collection has resolved.  The son reports concern for possible infection with similar symptoms in the past related to bacteremia.  Patient has moderate tenderness on abdominal exam.  No focal neurologic deficits.  Lungs are clear to  auscultation with normal oxygen saturation.  Slightly low blood pressure but patient is very small lady with low pressures in the past.  Doubt sepsis but do plan for screening labs including lactate, blood cultures, and will obtain CT abdomen pelvis.   CT pending. Labs with mild leukocytosis. Care transferred to Dr. Tyrone Nine pending CT.  ____________________________________________  FINAL CLINICAL IMPRESSION(S) / ED DIAGNOSES  Final diagnoses:  Generalized weakness  Pelvic abscess in female  Generalized abdominal pain     MEDICATIONS GIVEN DURING THIS VISIT:  Medications  enoxaparin (LOVENOX) injection 40 mg (40 mg Subcutaneous Given 10/03/18 2019)  HYDROmorphone (DILAUDID) injection 0.5 mg (0.5 mg Intravenous Given 10/03/18 2019)  0.9 %  sodium chloride infusion ( Intravenous New Bag/Given 10/03/18 2018)  ertapenem (INVANZ) 500 mg in sodium chloride 0.9 % 50 mL IVPB (has no administration in time range)  sodium chloride 0.9 % bolus 500 mL (0 mLs Intravenous Stopped 10/03/18 1523)  iohexol (OMNIPAQUE) 300 MG/ML solution 75 mL (75 mLs Intravenous Contrast Given 10/03/18 1510)  ertapenem (INVANZ) 1,000 mg in sodium chloride 0.9 % 100 mL IVPB (1,000 mg Intravenous New Bag/Given 10/03/18 1813)     Note:  This document was prepared using Dragon voice recognition software and may include unintentional dictation errors.  Nanda Quinton, MD Emergency Medicine    Devanie Galanti, [facility], MD 10/03/18 2021

## 2018-10-04 ENCOUNTER — Inpatient Hospital Stay (HOSPITAL_COMMUNITY): Payer: Medicare PPO

## 2018-10-04 ENCOUNTER — Encounter (HOSPITAL_COMMUNITY): Payer: Self-pay | Admitting: General Practice

## 2018-10-04 DIAGNOSIS — N739 Female pelvic inflammatory disease, unspecified: Secondary | ICD-10-CM

## 2018-10-04 DIAGNOSIS — R531 Weakness: Secondary | ICD-10-CM

## 2018-10-04 DIAGNOSIS — R1084 Generalized abdominal pain: Secondary | ICD-10-CM

## 2018-10-04 DIAGNOSIS — C569 Malignant neoplasm of unspecified ovary: Secondary | ICD-10-CM

## 2018-10-04 DIAGNOSIS — K651 Peritoneal abscess: Principal | ICD-10-CM

## 2018-10-04 DIAGNOSIS — C786 Secondary malignant neoplasm of retroperitoneum and peritoneum: Secondary | ICD-10-CM

## 2018-10-04 HISTORY — PX: IR SINUS/FIST TUBE CHK-NON GI: IMG673

## 2018-10-04 HISTORY — PX: IR CATHETER TUBE CHANGE: IMG717

## 2018-10-04 LAB — CBC
HCT: 22.2 % — ABNORMAL LOW (ref 36.0–46.0)
Hemoglobin: 7.1 g/dL — ABNORMAL LOW (ref 12.0–15.0)
MCH: 21.6 pg — ABNORMAL LOW (ref 26.0–34.0)
MCHC: 32 g/dL (ref 30.0–36.0)
MCV: 67.5 fL — ABNORMAL LOW (ref 80.0–100.0)
Platelets: 334 10*3/uL (ref 150–400)
RBC: 3.29 MIL/uL — ABNORMAL LOW (ref 3.87–5.11)
RDW: 19.8 % — ABNORMAL HIGH (ref 11.5–15.5)
WBC: 11.2 10*3/uL — ABNORMAL HIGH (ref 4.0–10.5)
nRBC: 0 % (ref 0.0–0.2)

## 2018-10-04 LAB — COMPREHENSIVE METABOLIC PANEL
ALT: 21 U/L (ref 0–44)
AST: 18 U/L (ref 15–41)
Albumin: 2.2 g/dL — ABNORMAL LOW (ref 3.5–5.0)
Alkaline Phosphatase: 53 U/L (ref 38–126)
Anion gap: 13 (ref 5–15)
BUN: 18 mg/dL (ref 8–23)
CO2: 20 mmol/L — ABNORMAL LOW (ref 22–32)
Calcium: 9.2 mg/dL (ref 8.9–10.3)
Chloride: 105 mmol/L (ref 98–111)
Creatinine, Ser: 0.88 mg/dL (ref 0.44–1.00)
GFR calc Af Amer: >60 mL/min (ref >60)
GFR calc non Af Amer: >60 mL/min (ref >60)
Glucose, Bld: 75 mg/dL (ref 70–99)
Potassium: 3.8 mmol/L (ref 3.5–5.1)
Sodium: 138 mmol/L (ref 135–145)
Total Bilirubin: 0.8 mg/dL (ref 0.3–1.2)
Total Protein: 6.8 g/dL (ref 6.5–8.1)

## 2018-10-04 LAB — PROTIME-INR
INR: 1.5 — ABNORMAL HIGH (ref 0.8–1.2)
Prothrombin Time: 18 seconds — ABNORMAL HIGH (ref 11.4–15.2)

## 2018-10-04 MED ORDER — LIDOCAINE HCL 1 % IJ SOLN
INTRAMUSCULAR | Status: AC
  Filled 2018-10-04: qty 20

## 2018-10-04 MED ORDER — SODIUM CHLORIDE 0.9% FLUSH
5.0000 mL | Freq: Three times a day (TID) | INTRAVENOUS | Status: DC
  Administered 2018-10-04 – 2018-10-06 (×6): 5 mL

## 2018-10-04 MED ORDER — IOHEXOL 300 MG/ML  SOLN
50.0000 mL | Freq: Once | INTRAMUSCULAR | Status: AC | PRN
  Administered 2018-10-04: 12:00:00 20 mL via INTRATHECAL

## 2018-10-04 MED ORDER — LIDOCAINE HCL 1 % IJ SOLN
INTRAMUSCULAR | Status: DC | PRN
  Administered 2018-10-04: 10 mL

## 2018-10-04 MED ORDER — SODIUM CHLORIDE 0.9 % IV SOLN
1.0000 g | Freq: Every day | INTRAVENOUS | Status: DC
  Filled 2018-10-04 (×2): qty 1

## 2018-10-04 NOTE — Plan of Care (Signed)
  Problem: Education: Goal: Knowledge of General Education information will improve Description Including pain rating scale, medication(s)/side effects and non-pharmacologic comfort measures Outcome: Progressing   Problem: Clinical Measurements: Goal: Will remain free from infection Outcome: Progressing   Problem: Clinical Measurements: Goal: Diagnostic test results will improve Outcome: Progressing   Problem: Safety: Goal: Ability to remain free from injury will improve Outcome: Progressing   Problem: Skin Integrity: Goal: Risk for impaired skin integrity will decrease Outcome: Progressing   Problem: Pain Managment: Goal: General experience of comfort will improve Outcome: Progressing

## 2018-10-04 NOTE — Consult Note (Addendum)
Reason for Consult: Pelvic fluid collection Referring Physician: Jacki Cones, MD  Jo Parker is an 73 y.o. female who presents with worsening abdominal pain, decreased appetitive, weakness and fever. HPI: She has a diagnosis of an advanced stage primary peritoneal carcinoma.  She s/p a radical debulking procedure complicated by a postoperative pelvic collection and sigmoid colon fistula.  A drain has been placed in the collection by VIR for conservative managment.  General surgery was consulted and consideration was given to possible sigmoidoscopy with subsequent drain removal.  She also c/o N/V; last normal BM yesterday.    She was admitted; Imaging showed enlargement of the pelvic collection.  VIR was consulted for drain management.  The drain was interrogated and noted to be partially occluded.  The drain was up sized today.  Past Medical History:  Diagnosis Date  . Acute on chronic respiratory failure with hypoxia ([place]) 03/07/2018  . Anxiety and depression   . Aortic aneurysm ([place])    see ov note 03/2017   . Arthritis   . Aspiration pneumonia ([place])   . Carcinomatosis ([place]) 10/2017  . CHF (congestive heart failure) ([name])    diastolic dysfunction  . COPD (chronic obstructive pulmonary disease) ([place]) 10/23/2017  . Depression   . Family history of breast cancer   . Family history of prostate cancer   . Family history of uterine cancer   . History of diverticulitis   . HLD (hyperlipidemia) 10/23/2017  . Hypertension   . Mitral valve prolapse   . Pelvic abscess in female   . Sleep apnea    cpap machine mask and tubing in [place] per patient.  Patient does not have mask or tubing to bring on DOS of 01/27/2018.   Marland Kitchen Uterine cancer ([place])    had partial hysterectomy at 27    Past Surgical History:  Procedure Laterality Date  . ABDOMINAL HYSTERECTOMY    . cardiac monitor implant    . CEREBRAL ANEURYSM REPAIR    . CERVICAL SPINE SURGERY     bone spurs  . DEBULKING N/A  01/27/2018   Procedure: RADICAL DEBULKING;  Surgeon: [name], MD;  Location: [facility];  Service: Gynecology;  Laterality: N/A;  . IR CATHETER TUBE CHANGE  06/13/2018  . IR CATHETER TUBE CHANGE  09/28/2018  . IR CATHETER TUBE CHANGE  10/04/2018  . IR FLUORO GUIDE PORT INSERTION RIGHT  10/14/2017  . IR PARACENTESIS  10/14/2017  . IR RADIOLOGIST EVAL & MGMT  04/26/2018  . IR RADIOLOGIST EVAL & MGMT  05/10/2018  . IR RADIOLOGIST EVAL & MGMT  05/25/2018  . IR RADIOLOGIST EVAL & MGMT  07/20/2018  . IR REMOVAL TUN ACCESS W/ PORT W/O FL MOD SED  07/02/2018  . IR SINUS/FIST TUBE CHK-NON GI  10/04/2018  . IR US GUIDE VASC ACCESS RIGHT  10/14/2017  . LAPAROTOMY N/A 01/27/2018   Procedure: EXPLORATORY LAPAROTOMY WITH REMOVAL OF BILATERAL TUBES AND OVARIES;  Surgeon: [name], MD;  Location: [facility];  Service: Gynecology;  Laterality: N/A;  . LAPAROTOMY N/A 01/31/2018   Procedure: EXPLORATORY LAPAROTOMY, ABDOMINAL WASHOUT;  Surgeon: Isabel Caprice, MD;  Location: [facility];  Service: Gynecology;  Laterality: N/A;  . OMENTECTOMY N/A 01/27/2018   Procedure: OMENTECTOMY;  Surgeon: [name], MD;  Location: [facility];  Service: Gynecology;  Laterality: N/A;  . PARTIAL HYSTERECTOMY  1971   "precancer"  . TEE WITHOUT CARDIOVERSION N/A 07/04/2018   Procedure: TRANSESOPHAGEAL ECHOCARDIOGRAM (TEE);  Surgeon: Sanda Klein, MD;  Location: [facility]  ENDOSCOPY;  Service: Cardiovascular;  Laterality: N/A;    Family History  Problem Relation Age of Onset  . Endometrial cancer Sister 39       endometrial ca  . Endometrial cancer Paternal Aunt 80  . Breast cancer Paternal Aunt   . Stomach cancer Maternal Grandfather        d. 49s  . Prostate cancer Paternal Uncle   . Breast cancer Cousin        pat first cousin  . Breast cancer Cousin        pat first cousin's daughter  . Lung cancer Father        lung ca  . Heart disease Mother   . Stroke Paternal Grandmother   . Endometrial cancer Cousin        paternal first  cousin's daughter  . Breast cancer Other        niece dx less than 53s  . Breast cancer Other        niece, dx under 105    Social History:  reports that she quit smoking about a year ago. She has a 55.00 pack-year smoking history. She has never used smokeless tobacco. She reports that she does not drink alcohol or use drugs.  Allergies:  Allergies  Allergen Reactions  . Oxycodone Other (See Comments)    confusion    Medications:  Continuous: . sodium chloride 100 mL/hr at 10/03/18 2018    Results for orders placed or performed during the hospital encounter of 10/03/18 (from the past 48 hour(s))  Comprehensive metabolic panel     Status: Abnormal   Collection Time: 10/03/18  1:10 PM  Result Value Ref Range   Sodium 136 135 - 145 mmol/L   Potassium 4.1 3.5 - 5.1 mmol/L   Chloride 100 98 - 111 mmol/L   CO2 23 22 - 32 mmol/L   Glucose, Bld 111 (H) 70 - 99 mg/dL   BUN 27 (H) 8 - 23 mg/dL   Creatinine, Ser 1.21 (H) 0.44 - 1.00 mg/dL   Calcium 9.6 8.9 - 10.3 mg/dL   Total Protein 7.7 6.5 - 8.1 g/dL   Albumin 2.4 (L) 3.5 - 5.0 g/dL   AST 21 15 - 41 U/L   ALT 24 0 - 44 U/L   Alkaline Phosphatase 62 38 - 126 U/L   Total Bilirubin 0.5 0.3 - 1.2 mg/dL   GFR calc non Af Amer 44 (L) >60 mL/min   GFR calc Af Amer 51 (L) >60 mL/min   Anion gap 13 5 - 15    Comment: Performed at [facility], [address], [place], [place] [address]  Lactic acid, plasma     Status: None   Collection Time: 10/03/18  1:10 PM  Result Value Ref Range   Lactic Acid, Venous 1.7 0.5 - 1.9 mmol/L    Comment: Performed at [facility] [address] [place], [place] [address]  CBC with Differential     Status: Abnormal   Collection Time: 10/03/18  1:10 PM  Result Value Ref Range   WBC 12.1 (H) 4.0 - 10.5 K/uL    Comment: REPEATED TO VERIFY WHITE COUNT CONFIRMED ON SMEAR    RBC 3.80 (L) 3.87 - 5.11 MIL/uL   Hemoglobin 8.1 (L) 12.0 - 15.0 g/dL    Comment: Reticulocyte Hemoglobin  testing may be clinically indicated, consider ordering this additional test [id number]    HCT 26.1 (L) 36.0 - 46.0 %   MCV 68.7 (  L) 80.0 - 100.0 fL    Comment: REPEATED TO VERIFY   MCH 21.3 (L) 26.0 - 34.0 pg   MCHC 31.0 30.0 - 36.0 g/dL   RDW 19.9 (H) 11.5 - 15.5 %   Platelets 399 150 - 400 K/uL    Comment: REPEATED TO VERIFY SPECIMEN CHECKED FOR CLOTS    nRBC 0.0 0.0 - 0.2 %   Neutrophils Relative % 68 %   Neutro Abs 8.0 (H) 1.7 - 7.7 K/uL   Lymphocytes Relative 23 %   Lymphs Abs 2.7 0.7 - 4.0 K/uL   Monocytes Relative 5 %   Monocytes Absolute 0.6 0.1 - 1.0 K/uL   Eosinophils Relative 3 %   Eosinophils Absolute 0.4 0.0 - 0.5 K/uL   Basophils Relative 0 %   Basophils Absolute 0.0 0.0 - 0.1 K/uL   WBC Morphology See Note     Comment: Increased Bands. >20% Bands   nRBC 0 0 /100 WBC   Myelocytes 1 %   Abs Immature Granulocytes 0.10 (H) 0.00 - 0.07 K/uL   Acanthocytes PRESENT    Polychromasia PRESENT     Comment: Performed at [facility] [address] [place], [place] [address]  Culture, blood (routine x 2)     Status: None (Preliminary result)   Collection Time: 10/03/18  2:00 PM  Result Value Ref Range   Specimen Description BLOOD RIGHT ANTECUBITAL    Special Requests      BOTTLES DRAWN AEROBIC AND ANAEROBIC Blood Culture adequate volume   Culture      NO GROWTH < 24 HOURS Performed at [facility] [address], [place], [place] [address]    Report Status PENDING   Culture, blood (routine x 2)     Status: None (Preliminary result)   Collection Time: 10/03/18  2:25 PM  Result Value Ref Range   Specimen Description BLOOD LEFT ARM    Special Requests      BOTTLES DRAWN AEROBIC AND ANAEROBIC Blood Culture results may not be optimal due to an inadequate volume of blood received in culture bottles   Culture      NO GROWTH < 24 HOURS Performed at [facility] [address], [place], [place] [address]    Report Status PENDING   Urinalysis,  Routine w reflex microscopic     Status: Abnormal   Collection Time: 10/03/18  4:49 PM  Result Value Ref Range   Color, Urine YELLOW YELLOW   APPearance CLEAR CLEAR   Specific Gravity, Urine 1.040 (H) 1.005 - 1.030   pH 7.0 5.0 - 8.0   Glucose, UA NEGATIVE NEGATIVE mg/dL   Hgb urine dipstick NEGATIVE NEGATIVE   Bilirubin Urine NEGATIVE NEGATIVE   Ketones, ur NEGATIVE NEGATIVE mg/dL   Protein, ur NEGATIVE NEGATIVE mg/dL   Nitrite NEGATIVE NEGATIVE   Leukocytes,Ua NEGATIVE NEGATIVE    Comment: Performed at [facility] [address] [place], [place] [address]  CBC     Status: Abnormal   Collection Time: 10/04/18  1:26 AM  Result Value Ref Range   WBC 11.2 (H) 4.0 - 10.5 K/uL   RBC 3.29 (L) 3.87 - 5.11 MIL/uL   Hemoglobin 7.1 (L) 12.0 - 15.0 g/dL    Comment: Reticulocyte Hemoglobin testing may be clinically indicated, consider ordering this additional test [id number]    HCT 22.2 (L) 36.0 - 46.0 %   MCV 67.5 (L) 80.0 - 100.0 fL   MCH 21.6 (L) 26.0 -  34.0 pg   MCHC 32.0 30.0 - 36.0 g/dL   RDW 19.8 (H) 11.5 - 15.5 %   Platelets 334 150 - 400 K/uL   nRBC 0.0 0.0 - 0.2 %    Comment: Performed at [facility], [place] [address] [place], [place] [address]  Comprehensive metabolic panel     Status: Abnormal   Collection Time: 10/04/18  1:26 AM  Result Value Ref Range   Sodium 138 135 - 145 mmol/L   Potassium 3.8 3.5 - 5.1 mmol/L   Chloride 105 98 - 111 mmol/L   CO2 20 (L) 22 - 32 mmol/L   Glucose, Bld 75 70 - 99 mg/dL   BUN 18 8 - 23 mg/dL   Creatinine, Ser 0.88 0.44 - 1.00 mg/dL   Calcium 9.2 8.9 - 10.3 mg/dL   Total Protein 6.8 6.5 - 8.1 g/dL   Albumin 2.2 (L) 3.5 - 5.0 g/dL   AST 18 15 - 41 U/L   ALT 21 0 - 44 U/L   Alkaline Phosphatase 53 38 - 126 U/L   Total Bilirubin 0.8 0.3 - 1.2 mg/dL   GFR calc non Af Amer >60 >60 mL/min   GFR calc Af Amer >60 >60 mL/min   Anion gap 13 5 - 15    Comment: Performed at [facility] [address], [place],  [place] [address]  Protime-INR     Status: Abnormal   Collection Time: 10/04/18  8:26 AM  Result Value Ref Range   Prothrombin Time 18.0 (H) 11.4 - 15.2 seconds   INR 1.5 (H) 0.8 - 1.2    Comment: (NOTE) INR goal varies based on device and disease states. Performed at [facility], [place] [address] [place], [place] [address]     Ct Abdomen Pelvis W Contrast  Result Date: 10/03/2018 CLINICAL DATA:  Increased generalized weakness. Recent surgery for ovarian cancer with subsequent percutaneous drainage of left pelvic abscess. EXAM: CT ABDOMEN AND PELVIS WITH CONTRAST TECHNIQUE: Multidetector CT imaging of the abdomen and pelvis was performed using the standard protocol following bolus administration of intravenous contrast. CONTRAST:  55mL OMNIPAQUE IOHEXOL 300 MG/ML  SOLN COMPARISON:  CT 07/21/2018. FINDINGS: Lower chest: Interval improved aeration of the lung bases with resolution of the right pleural effusion. Interval stenting of the thoracoabdominal aorta, further described below. Coronary artery atherosclerosis noted. Hepatobiliary: The liver is normal in density without suspicious focal abnormality. No evidence of gallstones, gallbladder wall thickening or biliary dilatation. Pancreas: Unremarkable. No pancreatic ductal dilatation or surrounding inflammatory changes. Spleen: Normal in size without focally suspicious finding. There are tiny low-density lesions posteriorly in the spleen on image 17/3 which are stable. Adrenals/Urinary Tract: Both adrenal glands appear normal. Stable small renal cysts, largest in the lower pole of the left kidney. No evidence of renal mass, urinary tract calculus or hydronephrosis. The bladder appears normal. Stomach/Bowel: Stable small periampullary duodenal diverticulum. The stomach and small bowel otherwise appear normal. There is a large amount of stool throughout the colon. There are loops of bowel which chronically protrude into postsurgical defects in the mid  abdominal wall. These appear stable without evidence of incarceration. Sigmoid colon diverticulosis and wall thickening are similar to the previous study. Vascular/Lymphatic: There are no enlarged abdominal or pelvic lymph nodes. Interval stent grafting of the distal thoracic and proximal abdominal aorta. The superior extent of the stent is not imaged. It extends inferiorly to the renal arteries. There are fenestrations and stents in the  proximal celiac trunk and superior mesenteric artery which appear patent. Contrast bolus is not optimal. The stent covers the previously demonstrated large saccular aneurysms which appear excluded from the aortic lumen. 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 No evidence of endograft leak. No new aneurysms are seen. The portal vein is patent. Reproductive: Hysterectomy.  No apparent adnexal mass. Other: Percutaneous left pelvic drain is in similar position, although was exchanged 5 days ago. Surrounding complex air-fluid collection with peripheral enhancement has enlarged, measuring 6.0 x 6.0 x 5.8 cm. The drain appears located in the posterior aspect of this collection. No separate or other enlarging pelvic fluid collections identified. Musculoskeletal: No acute or significant osseous findings. IMPRESSION: 1. Interval significant enlargement of left pelvic abscess. The percutaneous drain appears well located within this collection, suggesting a persistent associated colonic fistula. 2. No new intraperitoneal fluid collections or definite signs of metastatic disease identified. 3. Interval stenting of the distal thoracic and proximal abdominal aorta with exclusion of the previously demonstrated saccular aneurysms. The right-sided aneurysm has not significantly in size, although no endograft leak identified. Attention on follow-up  recommended. 4.  Aortic Atherosclerosis (ICD10-I70.0). Electronically Signed   By: Richardean Sale M.D.   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 Pelvic drain was recently replaced on 09/28/2018. Patient presents with increased weakness and follow-up CT demonstrated enlargement of the left pelvic abscess despite the percutaneous drain. Only a small amount of thick purulent fluid could be aspirated from the drain at the bedside. Plan for drain injection and possible exchange and up sizing. EXAM: ABSCESS DRAIN INJECTION WITH FLUOROSCOPY DRAIN EXCHANGE FLUOROSCOPY MEDICATIONS: None ANESTHESIA/SEDATION: None COMPLICATIONS: None immediate. PROCEDURE: Informed written consent was obtained from the patient after a thorough discussion of the procedural risks, benefits and alternatives. All questions were addressed. Maximal Sterile Barrier Technique was utilized including caps, mask, sterile gowns, sterile gloves, sterile drape, hand hygiene and skin antiseptic. A timeout was performed prior to the initiation of the procedure. The left lower quadrant drain was prepped and draped in sterile fashion. Contrast was injected into the tube with some difficulty. The tube appeared to be partially occluded. Subsequently, brown thick foul-smelling fluid was aspirated from the collection. The catheter was cut and removed over a Bentson wire. A new 12 French drain was advanced into the collection and approximately 75 mL of the thick brown fluid was removed. Follow-up drain injection was performed. Catheter was flushed with saline and attached to suction bulb. Skin was anesthetized with 1% lidocaine and the catheter was sutured to skin. FINDINGS: Old drain was well positioned within the abscess but not aspirating and flushing well. Contrast injection confirmed a persistent colonic fistula. After the drain was  exchanged for a 12 French drain, 75 mL brown foul-smelling fluid was removed from the cavity. Drainage turned from the brown thick material to bloody material by the end of the procedure. IMPRESSION: 1. Residual left pelvic abscess collection with a colonic fistula. 2. Purulent stool-like material is draining from the pelvic abscess. The old drainage catheter was partially occluded, therefore, a new larger drain was placed. The patient currently has a 12 [place] drain. 3. Continue with routine flushing and will keep the drain to suction bulb in order to keep the pelvic abscess  cavity decompressed. 4. Long-term plan for the drain is uncertain at this time. Electronically Signed   By: Markus Daft M.D.   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 Pelvic drain was recently replaced on 09/28/2018. Patient presents with increased weakness and follow-up CT demonstrated enlargement of the left pelvic abscess despite the percutaneous drain. Only a small amount of thick purulent fluid could be aspirated from the drain at the bedside. Plan for drain injection and possible exchange and up sizing. EXAM: ABSCESS DRAIN INJECTION WITH FLUOROSCOPY DRAIN EXCHANGE FLUOROSCOPY MEDICATIONS: None ANESTHESIA/SEDATION: None COMPLICATIONS: None immediate. PROCEDURE: Informed written consent was obtained from the patient after a thorough discussion of the procedural risks, benefits and alternatives. All questions were addressed. Maximal Sterile Barrier Technique was utilized including caps, mask, sterile gowns, sterile gloves, sterile drape, hand hygiene and skin antiseptic. A timeout was performed prior to the initiation of the procedure. The left lower quadrant drain was prepped and draped in sterile fashion. Contrast was injected into the tube with some difficulty. The tube appeared to be partially occluded. Subsequently, brown  thick foul-smelling fluid was aspirated from the collection. The catheter was cut and removed over a Bentson wire. A new 12 French drain was advanced into the collection and approximately 75 mL of the thick brown fluid was removed. Follow-up drain injection was performed. Catheter was flushed with saline and attached to suction bulb. Skin was anesthetized with 1% lidocaine and the catheter was sutured to skin. FINDINGS: Old drain was well positioned within the abscess but not aspirating and flushing well. Contrast injection confirmed a persistent colonic fistula. After the drain was exchanged for a 12 French drain, 75 mL brown foul-smelling fluid was removed from the cavity. Drainage turned from the brown thick material to bloody material by the end of the procedure. IMPRESSION: 1. Residual left pelvic abscess collection with a colonic fistula. 2. Purulent stool-like material is draining from the pelvic abscess. The old drainage catheter was partially occluded, therefore, a new larger drain was placed. The patient currently has a 12 [place] drain. 3. Continue with routine flushing and will keep the drain to suction bulb in order to keep the pelvic abscess cavity decompressed. 4. Long-term plan for the drain is uncertain at this time. Electronically Signed   By: Markus Daft M.D.   On: 10/04/2018 13:39    Review of Systems  Constitutional: Positive for fever and malaise/fatigue.  Gastrointestinal: Positive for abdominal pain. Negative for constipation, diarrhea, nausea and vomiting.   Blood pressure 135/65, pulse 95, temperature 99.3 F (37.4 C), temperature source Oral, resp. rate 14, weight 47 kg, SpO2 99 %. Physical Exam  Constitutional: She is oriented to person, place, and time. No distress.  Thin  HENT:  Head: Normocephalic and atraumatic.  GI: Soft.  Tympanic; midline scar w/excoriated area  Neurological: She is alert and oriented to person, place, and time.    Assessment/Plan: Patient w/h/o  an advanced primary peritoneal carcinoma and a postoperative pelvic collection/sigmoid colon fistula, admitted w/FTT. Now s/p up sizing of the percutaneous drain.  She is tolerating some po and her pain is adequately controlled. General Surgery note appreciated.  She is a poor surgical candidate I.e., for a diverting, permanent colostomy, in light of her functional status.  >continue conservative, supportive care: broad spectrum antibiotics, drainage of the pelvic collection >agree w/Palliative care consult  Jo Parker 10/04/2018, 2:19 PM

## 2018-10-04 NOTE — Procedures (Signed)
Interventional Radiology Procedure:   Indications: Enlarging left pelvic abscess with existing drain  Procedure: Drain injection and upsizing  Findings: Drain was partially occluded.  Fistula connection to sigmoid colon.  12 Fr drain placed and 75 ml of brown stool-like material removed.   Complications: none     EBL: Less than 10 ml  Plan: Placed drain to suction and will flush drain.     Chaddrick Brue R. Anselm Pancoast, MD  Pager: [phone]

## 2018-10-04 NOTE — Consult Note (Addendum)
Chief Complaint: Patient was seen in consultation today for intraabdominal abscess s/p drain placement.  Referring Physician(s): Georgette Shell  Supervising Physician: Markus Daft  Patient Status: [facility] - In-pt  History of Present Illness: Jo Parker is a 73 y.o. female with a past medical history of hypertension, hyperlipidemia, mitral valve prolapse, HF, hyperlipidemia, AAA s/p stenting, COPD, diverticulitis, peritoneal adenocarcinoma, sleep apnea, anxiety, and depression. She is known to IR. 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 She had a drain placed in this abscess in IR 04/18/2018 by Dr. Anselm Pancoast, most recently exchanged 09/28/2018 by Dr. Anselm Pancoast. She presented [facility] ED 10/03/2018 with complaints of generalized weakness and fever. In ED, CT abdomen/pelvis revealed significant enlargement of left pelvic abscess. She was admitted for further management.  CT abdomen/pelvis 10/03/2018: 1. Interval significant enlargement of left pelvic abscess. The percutaneous drain appears well located within this collection, suggesting a persistent associated colonic fistula. 2. No new intraperitoneal fluid collections or definite signs of metastatic disease identified. 3. Interval stenting of the distal thoracic and proximal abdominal aorta with exclusion of the previously demonstrated saccular aneurysms. The right-sided aneurysm has not significantly in size, although no endograft leak identified. Attention on follow-up recommended. 4. Aortic Atherosclerosis (ICD10-I70.0).  IR requested by Dr. Rodena Piety for possible image-guided percutaneous abscess drain placement. Patient awake and alert laying in bed. Complains of abdominal pain and generalized weakness.   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  Patient does not have mask or tubing to bring on DOS of 01/27/2018.    Uterine cancer ([place])    had partial hysterectomy at 27    Past Surgical History:  Procedure Laterality Date   ABDOMINAL HYSTERECTOMY     cardiac monitor implant     CEREBRAL ANEURYSM REPAIR     CERVICAL SPINE SURGERY     bone spurs   DEBULKING N/A 01/27/2018   Procedure: RADICAL DEBULKING;  Surgeon: [name], MD;  Location: [facility] ORS;  Service: Gynecology;  Laterality: N/A;   IR CATHETER TUBE CHANGE  06/13/2018   IR CATHETER TUBE CHANGE  09/28/2018   IR FLUORO GUIDE PORT INSERTION RIGHT  10/14/2017   IR PARACENTESIS  10/14/2017   IR RADIOLOGIST EVAL & MGMT  04/26/2018   IR RADIOLOGIST EVAL & MGMT  05/10/2018   IR RADIOLOGIST EVAL & MGMT  05/25/2018   IR RADIOLOGIST EVAL & MGMT  07/20/2018   IR REMOVAL TUN ACCESS W/ PORT W/O FL MOD SED  07/02/2018   IR US GUIDE VASC ACCESS RIGHT  10/14/2017   LAPAROTOMY N/A 01/27/2018   Procedure: EXPLORATORY LAPAROTOMY WITH REMOVAL  OF BILATERAL TUBES AND OVARIES;  Surgeon: [name], MD;  Location: [facility];  Service: Gynecology;  Laterality: N/A;   LAPAROTOMY N/A 01/31/2018   Procedure: EXPLORATORY LAPAROTOMY, ABDOMINAL WASHOUT;  Surgeon: Isabel Caprice, MD;  Location: [facility];  Service: Gynecology;  Laterality: N/A;   OMENTECTOMY N/A 01/27/2018    Procedure: OMENTECTOMY;  Surgeon: [name], MD;  Location: [facility];  Service: Gynecology;  Laterality: N/A;   PARTIAL HYSTERECTOMY  1971   "precancer"   TEE WITHOUT CARDIOVERSION N/A 07/04/2018   Procedure: TRANSESOPHAGEAL ECHOCARDIOGRAM (TEE);  Surgeon: Sanda Klein, MD;  Location: [facility] ENDOSCOPY;  Service: Cardiovascular;  Laterality: N/A;    Allergies: Oxycodone  Medications: Prior to Admission medications   Medication Sig Start Date End Date Taking? Authorizing Provider  acetaminophen (TYLENOL) 500 MG tablet Take 500 mg by mouth every 6 (six) hours as needed for mild pain or headache.   Yes [provider]  amoxicillin-clavulanate (AUGMENTIN) 875-125 MG tablet Take 1 tablet by mouth 2 (two) times daily. 09/06/18  Yes Comer, Okey Regal, MD  aspirin 81 MG chewable tablet Chew 81 mg by mouth daily. 09/01/18  Yes [provider]  carvedilol (COREG) 25 MG tablet Take 25 mg by mouth 2 (two) times daily. 09/19/18 10/19/18 Yes [provider]  clopidogrel (PLAVIX) 75 MG tablet Take 75 mg by mouth daily.   Yes [provider]  feeding supplement (BOOST HIGH PROTEIN) LIQD Take 1 Container by mouth 2 (two) times daily between meals.   Yes [provider]  fluconazole (DIFLUCAN) 200 MG tablet Take 1 tablet (200 mg total) by mouth daily. 09/06/18  Yes Comer, Okey Regal, MD  magnesium oxide (MAG-OX) 400 MG tablet Take 400 mg by mouth 2 (two) times daily.    Yes [provider]  mirtazapine (REMERON) 30 MG tablet Take 1 tablet (30 mg total) by mouth at bedtime. Patient taking differently: Take 15 mg by mouth at bedtime. Taking  1/2 tablet at bedtime 05/13/18  Yes [name], MD  Pediatric Multiple Vit-C-FA (MULTIVITAMIN ANIMAL SHAPES, WITH CA/FA,) with C & FA chewable tablet Chew 1 tablet by mouth daily.   Yes [provider]  traMADol (ULTRAM) 50 MG tablet Take 1 tablet (50 mg total) by mouth every 6 (six) hours as needed for moderate pain.  08/10/18  Yes Ghimire, Henreitta Leber, MD  feeding supplement, ENSURE ENLIVE, (ENSURE ENLIVE) LIQD Take 237 mLs by mouth 2 (two) times daily between meals. Patient not taking: Reported on 10/03/2018 08/10/18   Jonetta Osgood, MD  megestrol (MEGACE) 400 MG/10ML suspension Take 10 mLs (400 mg total) by mouth daily. Patient not taking: Reported on 10/03/2018 08/10/18   Jonetta Osgood, MD     Family History  Problem Relation Age of Onset   Endometrial cancer Sister 20       endometrial ca   Endometrial cancer Paternal Aunt 28   Breast cancer Paternal Aunt    Stomach cancer Maternal Grandfather        d. 31s   Prostate cancer Paternal Uncle    Breast cancer Cousin        pat first cousin   Breast cancer Cousin        pat first cousin's daughter   Lung cancer Father        lung ca   Heart disease Mother    Stroke Paternal Grandmother    Endometrial cancer Cousin        paternal first cousin's daughter  Breast cancer Other        niece dx less than 45s   Breast cancer Other        niece, dx under 86    Social History   Socioeconomic History   Marital status: Married    Spouse name: Not on file   Number of children: 3   Years of education: Not on file   Highest education level: Not on file  Occupational History   Occupation: retired  Scientist, product/process development strain: Not on file   Food insecurity:    Worry: Not on file    Inability: Not on file   Transportation needs:    Medical: Not on file    Non-medical: Not on file  Tobacco Use   Smoking status: Former Smoker    Packs/day: 1.00    Years: 55.00    Pack years: 55.00    Last attempt to quit: 10/06/2017    Years since quitting: 0.9   Smokeless tobacco: Never Used  Substance and Sexual Activity   Alcohol use: Never    Frequency: Never   Drug use: Never   Sexual activity: Not Currently  Lifestyle   Physical activity:    Days per week: Not on file    Minutes per session: Not on file     Stress: Not on file  Relationships   Social connections:    Talks on phone: Not on file    Gets together: Not on file    Attends religious service: Not on file    Active member of club or organization: Not on file    Attends meetings of clubs or organizations: Not on file    Relationship status: Not on file  Other Topics Concern   Not on file  Social History Narrative   Not on file     Review of Systems: A 12 point ROS discussed and pertinent positives are indicated in the HPI above.  All other systems are negative.  Review of Systems  Constitutional: Negative for chills and fever.  Respiratory: Negative for shortness of breath and wheezing.   Cardiovascular: Negative for chest pain and palpitations.  Gastrointestinal: Positive for abdominal pain.  Neurological: Negative for headaches.    Vital Signs: BP 138/70 (BP Location: Left Arm)    Pulse 90    Temp 98.7 F (37.1 C) (Oral)    Resp 14    Wt 103 lb 9.9 oz (47 kg)    SpO2 100%    BMI 18.35 kg/m   Physical Exam Vitals signs and nursing note reviewed.  Constitutional:      General: She is not in acute distress.    Appearance: Normal appearance.  Pulmonary:     Effort: Pulmonary effort is normal. No respiratory distress.  Abdominal:     Comments: Left pelvic drain with minimal output of pus in gravity bag.  Skin:    General: Skin is warm and dry.  Neurological:     Mental Status: She is alert and oriented to person, place, and time.  Psychiatric:        Mood and Affect: Mood normal.        Behavior: Behavior normal.        Thought Content: Thought content normal.        Judgment: Judgment normal.       Imaging: Ct Abdomen Pelvis W Contrast  Result Date: 10/03/2018 CLINICAL DATA:  Increased generalized weakness. Recent surgery for ovarian cancer with subsequent percutaneous  drainage of left pelvic abscess. EXAM: CT ABDOMEN AND PELVIS WITH CONTRAST TECHNIQUE: Multidetector CT imaging of the abdomen and  pelvis was performed using the standard protocol following bolus administration of intravenous contrast. CONTRAST:  19mL OMNIPAQUE IOHEXOL 300 MG/ML  SOLN COMPARISON:  CT 07/21/2018. FINDINGS: Lower chest: Interval improved aeration of the lung bases with resolution of the right pleural effusion. Interval stenting of the thoracoabdominal aorta, further described below. Coronary artery atherosclerosis noted. Hepatobiliary: The liver is normal in density without suspicious focal abnormality. No evidence of gallstones, gallbladder wall thickening or biliary dilatation. Pancreas: Unremarkable. No pancreatic ductal dilatation or surrounding inflammatory changes. Spleen: Normal in size without focally suspicious finding. There are tiny low-density lesions posteriorly in the spleen on image 17/3 which are stable. Adrenals/Urinary Tract: Both adrenal glands appear normal. Stable small renal cysts, largest in the lower pole of the left kidney. No evidence of renal mass, urinary tract calculus or hydronephrosis. The bladder appears normal. Stomach/Bowel: Stable small periampullary duodenal diverticulum. The stomach and small bowel otherwise appear normal. There is a large amount of stool throughout the colon. There are loops of bowel which chronically protrude into postsurgical defects in the mid abdominal wall. These appear stable without evidence of incarceration. Sigmoid colon diverticulosis and wall thickening are similar to the previous study. Vascular/Lymphatic: There are no enlarged abdominal or pelvic lymph nodes. Interval stent grafting of the distal thoracic and proximal abdominal aorta. The superior extent of the stent is not imaged. It extends inferiorly to the renal arteries. There are fenestrations and stents in the proximal celiac trunk and superior mesenteric artery which appear patent. Contrast bolus is not optimal. The stent covers the previously demonstrated large saccular aneurysms which appear excluded  from the aortic lumen. 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 No evidence of endograft leak. No new aneurysms are seen. The portal vein is patent. Reproductive: Hysterectomy.  No apparent adnexal mass. Other: Percutaneous left pelvic drain is in similar position, although was exchanged 5 days ago. Surrounding complex air-fluid collection with peripheral enhancement has enlarged, measuring 6.0 x 6.0 x 5.8 cm. The drain appears located in the posterior aspect of this collection. No separate or other enlarging pelvic fluid collections identified. Musculoskeletal: No acute or significant osseous findings. IMPRESSION: 1. Interval significant enlargement of left pelvic abscess. The percutaneous drain appears well located within this collection, suggesting a persistent associated colonic fistula. 2. No new intraperitoneal fluid collections or definite signs of metastatic disease identified. 3. Interval stenting of the distal thoracic and proximal abdominal aorta with exclusion of the previously demonstrated saccular aneurysms. The right-sided aneurysm has not significantly in size, although no endograft leak identified. Attention on follow-up recommended. 4.  Aortic Atherosclerosis (ICD10-I70.0). Electronically Signed   By: Richardean Sale M.D.   On: 10/03/2018 16:06   Ir Catheter Tube Change  Result Date: 09/28/2018 INDICATION: 73 year old with history of peritoneal adenocarcinoma with a chronic abscess collection along the left pelvic sidewall associated with a colonic fistula. Percutaneous drain in this area since 04/18/2018. Patient reports stool-like material leaking around the drain site. EXAM: DRAIN INJECTION WITH FLUOROSCOPY ABSCESS DRAIN EXCHANGE WITH FLUOROSCOPY MEDICATIONS: Patient is reportedly on oral antibiotics. ANESTHESIA/SEDATION: None COMPLICATIONS:  None immediate. PROCEDURE: The drain dressings were removed. The radiopaque marker was outside of the body and one sidehole was outside of the body. Tip of the catheter  was still underneath the skin. Informed written consent was obtained from the patient after a thorough discussion of the procedural risks, benefits and alternatives. All questions were addressed. Maximal Sterile Barrier Technique was utilized including caps, mask, sterile gowns, sterile gloves, sterile drape, hand hygiene and skin antiseptic. A timeout was performed prior to the initiation of the procedure. The catheter and surrounding skin was prepped and draped in sterile fashion. Contrast was injected through the tube. The catheter was cut and removed over a Bentson wire. Kumpe catheter was advanced into the pelvis. Additional contrast was injected. Catheter was removed over the wire and a new 10.2 [place] multipurpose drain was advanced over the wire. Approximately 30 mL of pink purulent fluid was removed from the pelvic abscess collection. Collection was irrigated with sterile saline. Follow-up drain injection was performed. Catheter was flushed and attached to gravity bag. Skin was anesthetized with 1% lidocaine and the catheter was secured to the skin with a suture and StatLock device. FINDINGS: Drainage catheter was partially dislodged with a side hole outside of the body. New drain was successfully advanced back into the pelvic collection and 30 mL of purulent fluid was removed. The abscess collection appear to be decompressed at the end of the procedure Drain injection confirmed a chronic colonic fistula. IMPRESSION: Successful exchange of the abscess drainage catheter. Approximately 30 mL of purulent fluid was removed from the chronic pelvic collection. Abscess cavity was decompressed at the end of the procedure. Persistent colonic fistula. Electronically Signed   By: Markus Daft M.D.   On: 09/28/2018 16:18   Dg Sinus/fist Tube Chk-non  Gi  Result Date: 09/21/2018 INDICATION: Left pelvic abscess with persistent fistula to the adjacent sigmoid colon. EXAM: INJECTION OF PRE-EXISTING PERCUTANEOUS ABSCESS DRAIN UNDER FLUOROSCOPY MEDICATIONS: No additional medications. ANESTHESIA/SEDATION: None CONTRAST:  80 mL Omnipaque 300 FLUOROSCOPY TIME:  10 seconds.  3.2 mGy. COMPLICATIONS: None immediate. PROCEDURE: Informed written consent was obtained from the patient after a thorough discussion of the procedural risks, benefits and alternatives. All questions were addressed. Maximal Sterile Barrier Technique was utilized including caps, mask, sterile gowns, sterile gloves, sterile drape, hand hygiene and skin antiseptic. A timeout was performed prior to the initiation of the procedure. Contrast was injected via the pre-existing percutaneous drain. Fluoroscopic spot images were obtained. The drain was flushed and connected to a gravity drainage bag. FINDINGS: There remains a small triangular shaped decompressed abscess cavity in the left lateral lower pelvis. Superior fistula again noted directly communicating with the lumen of the sigmoid colon. Findings are similar to prior injection studies including the most recent dated 07/20/2018. 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 Electronically Signed   By: Aletta Edouard M.D.   On: 09/21/2018 12:42    Labs:  CBC: Recent Labs    08/09/18 0435 08/10/18 0403 10/03/18 1310 10/04/18 0126  WBC 7.3 6.3 12.1* 11.2*  HGB 10.0* 9.4* 8.1* 7.1*  HCT 32.0* 29.7* 26.1* 22.2*  PLT 331 280 399 334    COAGS: Recent Labs    10/08/17 0710  01/31/18 0850  06/30/18 1704 07/02/18 0500 07/21/18 1324 07/23/18 1109  INR 1.22   < > 1.36   < > 1.54 1.54 1.24 1.52  APTT 32  --  36  --   --   --   --   --    < > = values in this interval not displayed.    BMP: Recent Labs  08/09/18 0435 08/10/18 0403 10/03/18 1310  10/04/18 0126  NA 135 136 136 138  K 4.0 3.5 4.1 3.8  CL 97* 100 100 105  CO2 29 27 23  20*  GLUCOSE 98 81 111* 75  BUN 20 19 27* 18  CALCIUM 9.4 9.3 9.6 9.2  CREATININE 0.97 0.71 1.21* 0.88  GFRNONAA 58* >60 44* >60  GFRAA >60 >60 51* >60    LIVER FUNCTION TESTS: Recent Labs    08/06/18 0323 08/09/18 0435 10/03/18 1310 10/04/18 0126  BILITOT 0.6 0.4 0.5 0.8  AST 26 29 21 18   ALT 12 20 24 21   ALKPHOS 79 74 62 53  PROT 6.9 7.6 7.7 6.8  ALBUMIN 2.4* 2.6* 2.4* 2.2*     Assessment and Plan:  Chronic abscess collection along left pelvic side wall with 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 Anselm Pancoast (most recently exchanged 09/28/2018 by Dr. Anselm Pancoast), now with CT showing enlarging abscess. Drain was evaluated today by myself and Dr. Anselm Pancoast. Approximately 10 cc of pus was aspirated from drain and JP drain was placed (in place of gravity bag). At this time, IR does not recommend placing another drain. Will plan to exchange/upsize drain today in IR. Further plans per Dr. Anselm Pancoast. Please call IR with questions/concerns.   Thank you for this interesting consult.  I greatly enjoyed meeting Jo Parker and look forward to participating in their care.  A copy of this report was sent to the requesting provider on this date.  Electronically Signed: Earley Abide, PA-C 10/04/2018, 8:39 AM   I spent a total of 40 Minutes in face to face in clinical consultation, greater than 50% of which was counseling/coordinating care for intraabdominal abscess s/p drain placement.

## 2018-10-04 NOTE — Progress Notes (Addendum)
Subjective: This is a 73 year old female with a history of stage IV ovarian cancer, peritoneal carcinomatosis, status post neoadjuvant chemotherapy, debulking surgery in August, 2019.  That hospitalization was complicated by sepsis and respiratory failure.   Also history of COPD.  History abdominal aortic aneurysm with endovascular stenting.  On April 18, 2018 CT-guided placement of a drainage catheter within the left pelvic sidewall fluid collection was performed.  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  She has been followed by my partner, Dr. Leighton Ruff, since June 10, 2018 at which time she was initially evaluated for a pelvic abscess and persistent colonic fistula. Her initial recommendation was to continue percutaneous drainage to control the colocutaneous fistula.  Sigmoidoscopy was recommended twice but that has never been done.  The thought was that if there was no colonic stricture or intraluminal tumor that the drain could possibly be backed out slowly and allow the fistula tract to close.  On January 23 she was admitted to the hospital for weakness and found to have bacteremia.  Her right IJ port catheter was removed.  TEE showed no evidence of endocarditis.  On September 28, 2018 interventional radiology replaced her drain and stated that it was draining well and the abscess cavity was collapsed.  She was admitted by the triad hospitalist yesterday with increasing pain, weight loss, failure to thrive, unable to ambulate but no nausea vomiting or diarrhea. CT scan showed significant enlargement of the left pelvic abscess. There appears to be some fascial separation and the right transverse colon is in the subcutaneous position/ Percutaneous drain appears to be  located within this collection.  The abscess is inadequately controlled by this current drain for some reason.. She has been started on Invanz.  IR has been consulted to reposition  or exchange the drain to achieve better drainage That is appropriate    Objective: Vital signs in last 24 hours: Temp:  [97.6 F (36.4 C)-98.7 F (37.1 C)] 98.7 F (37.1 C) (04/28 0358) Pulse Rate:  [72-90] 90 (04/28 0358) Resp:  [14-20] 14 (04/28 0358) BP: (99-139)/(42-70) 138/70 (04/28 0358) SpO2:  [98 %-100 %] 100 % (04/28 0358) Weight:  [47 kg] 47 kg (04/27 1900)    Intake/Output from previous day: 04/27 0701 - 04/28 0700 In: 1803.5 [I.V.:156.9; IV Piggyback:1646.7] Out: -  Intake/Output this shift: No intake/output data recorded.    PE  General: Appears cachectic elderly chronically sick looking, obviously deconditioned.  Alert and answers questions appropriately.  ENT: grossly normal hearing, lips & tongue, dry mucous membrane  Neck: no LAD, masses or thyromegaly  Cardiovascular: RRR, no m/r/g. No LE edema.   Respiratory:  CTA bilaterally, no w/r/r. Normal respiratory effort.  Abdomen: soft not distended.  Tender left lower quadrant greater than right lower quadrant with some guarding.  Midline wound has a little bit of epidermal breakdown but no hernia or midline drainage.  Almost no fluid in the JP bulb. drain in place with approximately 25 cc of yellow purulent drainage and surgical scar noted.  Musculoskeletal:  grossly normal tone BUE/BLE, good ROM, no bony abnormality     Lab Results:  Recent Labs    10/03/18 1310 10/04/18 0126  WBC 12.1* 11.2*  HGB 8.1* 7.1*  HCT 26.1* 22.2*  PLT 399 334   BMET Recent Labs    10/03/18 1310 10/04/18 0126  NA 136 138  K 4.1 3.8  CL 100 105  CO2 23 20*  GLUCOSE 111* 75  BUN 27* 18  CREATININE 1.21* 0.88  CALCIUM 9.6 9.2   PT/INR Recent Labs    10/04/18 0826  LABPROT 18.0*  INR 1.5*   ABG No results for input(s): PHART, HCO3 in the last 72 hours.  Invalid input(s): PCO2, PO2  Studies/Results: Ct Abdomen Pelvis W Contrast  Result Date: 10/03/2018 CLINICAL DATA:  Increased generalized  weakness. Recent surgery for ovarian cancer with subsequent percutaneous drainage of left pelvic abscess. EXAM: CT ABDOMEN AND PELVIS WITH CONTRAST TECHNIQUE: Multidetector CT imaging of the abdomen and pelvis was performed using the standard protocol following bolus administration of intravenous contrast. CONTRAST:  58mL OMNIPAQUE IOHEXOL 300 MG/ML  SOLN COMPARISON:  CT 07/21/2018. FINDINGS: Lower chest: Interval improved aeration of the lung bases with resolution of the right pleural effusion. Interval stenting of the thoracoabdominal aorta, further described below. Coronary artery atherosclerosis noted. Hepatobiliary: The liver is normal in density without suspicious focal abnormality. No evidence of gallstones, gallbladder wall thickening or biliary dilatation. Pancreas: Unremarkable. No pancreatic ductal dilatation or surrounding inflammatory changes. Spleen: Normal in size without focally suspicious finding. There are tiny low-density lesions posteriorly in the spleen on image 17/3 which are stable. Adrenals/Urinary Tract: Both adrenal glands appear normal. Stable small renal cysts, largest in the lower pole of the left kidney. No evidence of renal mass, urinary tract calculus or hydronephrosis. The bladder appears normal. Stomach/Bowel: Stable small periampullary duodenal diverticulum. The stomach and small bowel otherwise appear normal. There is a large amount of stool throughout the colon. There are loops of bowel which chronically protrude into postsurgical defects in the mid abdominal wall. These appear stable without evidence of incarceration. Sigmoid colon diverticulosis and wall thickening are similar to the previous study. Vascular/Lymphatic: There are no enlarged abdominal or pelvic lymph nodes. Interval stent grafting of the distal thoracic and proximal abdominal aorta. The superior extent of the stent is not imaged. It extends inferiorly to the renal arteries. There are fenestrations and stents in  the proximal celiac trunk and superior mesenteric artery which appear patent. Contrast bolus is not optimal. The stent covers the previously demonstrated large saccular aneurysms which appear excluded from the aortic lumen. 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 No evidence of endograft leak. No new aneurysms are seen. The portal vein is patent. Reproductive: Hysterectomy.  No apparent adnexal mass. Other: Percutaneous left pelvic drain is in similar position, although was exchanged 5 days ago. Surrounding complex air-fluid collection with peripheral enhancement has enlarged, measuring 6.0 x 6.0 x 5.8 cm. The drain appears located in the posterior aspect of this collection. No separate or other enlarging pelvic fluid collections identified. Musculoskeletal: No acute or significant osseous findings. IMPRESSION: 1. Interval significant enlargement of left pelvic abscess. The percutaneous drain appears well located within this collection, suggesting a persistent associated colonic fistula. 2. No new intraperitoneal fluid collections or definite signs of metastatic disease identified. 3. Interval stenting of the distal thoracic and proximal abdominal aorta with exclusion of the previously demonstrated saccular aneurysms. The right-sided aneurysm has not significantly in size, although no endograft leak identified. Attention on follow-up recommended. 4.  Aortic Atherosclerosis (ICD10-I70.0). Electronically Signed   By: Richardean Sale M.D.   On: 10/03/2018 16:06    Anti-infectives: Anti-infectives (From admission, onward)   Start     Dose/Rate Route Frequency Ordered Stop   10/04/18 1800  ertapenem (  INVANZ) 1,000 mg in sodium chloride 0.9 % 100 mL IVPB     1 g 200 mL/hr over 30 Minutes Intravenous Daily 10/04/18 0732     10/04/18 1600  ertapenem (INVANZ) 500  mg in sodium chloride 0.9 % 50 mL IVPB  Status:  Discontinued     500 mg 100 mL/hr over 30 Minutes Intravenous Daily 10/03/18 1959 10/04/18 0732   10/03/18 1645  vancomycin (VANCOCIN) IVPB 1000 mg/200 mL premix  Status:  Discontinued     1,000 mg 200 mL/hr over 60 Minutes Intravenous  Once 10/03/18 1636 10/03/18 1641   10/03/18 1645  piperacillin-tazobactam (ZOSYN) IVPB 3.375 g  Status:  Discontinued     3.375 g 100 mL/hr over 30 Minutes Intravenous  Once 10/03/18 1636 10/03/18 1641   10/03/18 1645  ertapenem (INVANZ) 1,000 mg in sodium chloride 0.9 % 100 mL IVPB     1 g 200 mL/hr over 30 Minutes Intravenous  Once 10/03/18 1641 10/03/18 2357      Assessment/Plan:  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  The fistula is poorly controlled  at this time. -She has been started on appropriate antibiotics - surgical goals of care now are source control and pain control. -The next step in her care is to try to achieve source control and evacuate the abscess percutaneously.  IR has been consulted and hopefully this will be accomplished today - flex sigmoidoscopy would be of some diagnostic interest but would not afford any therapeutic benefit. -She is very deconditioned and is a poor surgical candidate -If all else fails, consideration can be given to intestinal diversion, possibly with a proximal ostomy -If she needs further surgery, consideration should be given to transferring her to [facility], tertiary care center, GYN oncology service who performed her initial surgery -I am skeptical that she could survive laparotomy and surgical intervention, however. -long term prognosis poor.  Stage IV ovarian cancer with peritoneal carcinomatosis, status post debulking following neoadjuvant chemotherapy in 2019.Marland Kitchen  Postop course was complicated by sepsis, suspicion for perforation, respiratory failure.  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  AKI  Markedly deconditioned and failure to thrive.  She is a very poor surgical candidate It is noted that the family refuses palliative care consult. Strongly recommend ongoing goals of care conversation    LOS: 1 day    Adin Hector 10/04/2018

## 2018-10-04 NOTE — Progress Notes (Addendum)
History and Physical    Chaslyn Eisen UJW:[id number] DOB: 16-Sep-1945 DOA: 10/03/2018  PCP: No primary care provider on file. Dr. 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  Patient still has a drain in place son reports a drain was placed by interventional radiology at [facility].  It is draining yellow color liquid has been that way for many days.  Patient was treated with 6 weeks of Invanz and fluconazole.  Patient also has pelvic abscess with colonic fistula.  Son reports that patient has lost significant amount of weight with decreased appetite generalized weakness unable to ambulate with no nausea or vomiting or diarrhea.  Denies any chest pain shortness of breath or cough.  Patient was admitted to the hospital 08/05/2018 discharged 08/10/2018 discharged on fluconazole and ertapenem completed on 09/09/2018  ED Course: received invanz x1.  CT of the abdomen and pelvis showed significant enlargement of the left pelvic abscess.  Percutaneous drain appears well located within this collection staying a persistent associated colonic fistula.  No new intraperitoneal fluid collections or definite signs of metastatic disease.  Will stenting of the distal thoracic and proximal abdominal aorta with exclusion of the previously demonstrated saccular aneurysm.   Echo 07/04/2018 no evidence of endocarditis Review of Systems see HPI Ambulatory Status: Patient baseline walks inside the house very small distances.  Past Medical History:  Diagnosis Date   Acute on chronic respiratory failure with hypoxia ([place]) 03/07/2018   Anxiety and depression     Aortic aneurysm ([place])    see ov note 03/2017    Arthritis    Aspiration pneumonia ([place])    Carcinomatosis ([place]) 10/2017   CHF (congestive heart failure) ([name])    diastolic dysfunction   COPD (chronic obstructive pulmonary disease) ([place]) 10/23/2017   Depression    Family history of breast cancer    Family history of prostate cancer    Family history of uterine cancer    History of diverticulitis    HLD (hyperlipidemia) 10/23/2017   Hypertension    Mitral valve prolapse    Pelvic abscess in female    Sleep apnea    cpap machine mask and tubing in [place] per patient.  Patient does not have mask or tubing to bring on DOS of 01/27/2018.    Uterine cancer ([place])    had partial hysterectomy at 27    Past Surgical History:  Procedure Laterality Date   ABDOMINAL HYSTERECTOMY     cardiac monitor implant     CEREBRAL ANEURYSM REPAIR     CERVICAL SPINE SURGERY     bone spurs   DEBULKING N/A 01/27/2018   Procedure: RADICAL DEBULKING;  Surgeon: [name], MD;  Location: [facility];  Service: Gynecology;  Laterality: N/A;   IR CATHETER TUBE CHANGE  06/13/2018   IR CATHETER TUBE CHANGE  09/28/2018   IR CATHETER TUBE CHANGE  10/04/2018   IR FLUORO GUIDE PORT INSERTION RIGHT  10/14/2017   IR PARACENTESIS  10/14/2017   IR RADIOLOGIST EVAL & MGMT  04/26/2018   IR RADIOLOGIST EVAL & MGMT  05/10/2018   IR RADIOLOGIST EVAL & MGMT  05/25/2018   IR RADIOLOGIST EVAL & MGMT  07/20/2018   IR REMOVAL TUN ACCESS  W/ PORT W/O FL MOD SED  07/02/2018   IR SINUS/FIST TUBE CHK-NON GI  10/04/2018   IR US GUIDE VASC ACCESS RIGHT  10/14/2017   LAPAROTOMY N/A 01/27/2018   Procedure: EXPLORATORY LAPAROTOMY WITH REMOVAL OF BILATERAL TUBES AND OVARIES;  Surgeon: [name], MD;  Location: [facility];  Service: Gynecology;  Laterality: N/A;   LAPAROTOMY N/A 01/31/2018   Procedure: EXPLORATORY LAPAROTOMY, ABDOMINAL WASHOUT;  Surgeon: Isabel Caprice, MD;  Location: [facility];  Service: Gynecology;   Laterality: N/A;   OMENTECTOMY N/A 01/27/2018   Procedure: OMENTECTOMY;  Surgeon: [name], MD;  Location: [facility];  Service: Gynecology;  Laterality: N/A;   PARTIAL HYSTERECTOMY  1971   "precancer"   TEE WITHOUT CARDIOVERSION N/A 07/04/2018   Procedure: TRANSESOPHAGEAL ECHOCARDIOGRAM (TEE);  Surgeon: Sanda Klein, MD;  Location[facility] ENDOSCOPY;  Service: Cardiovascular;  Laterality: N/A;    Social History   Socioeconomic History   Marital status: Married    Spouse name: Not on file   Number of children: 3   Years of education: Not on file   Highest education level: Not on file  Occupational History   Occupation: retired  Scientist, product/process development strain: Not on file   Food insecurity:    Worry: Not on file    Inability: Not on file   Transportation needs:    Medical: Not on file    Non-medical: Not on file  Tobacco Use   Smoking status: Former Smoker    Packs/day: 1.00    Years: 55.00    Pack years: 55.00    Last attempt to quit: 10/06/2017    Years since quitting: 0.9   Smokeless tobacco: Never Used  Substance and Sexual Activity   Alcohol use: Never    Frequency: Never   Drug use: Never   Sexual activity: Not Currently  Lifestyle   Physical activity:    Days per week: Not on file    Minutes per session: Not on file   Stress: Not on file  Relationships   Social connections:    Talks on phone: Not on file    Gets together: Not on file    Attends religious service: Not on file    Active member of club or organization: Not on file    Attends meetings of clubs or organizations: Not on file    Relationship status: Not on file   Intimate partner violence:    Fear of current or ex partner: Not on file    Emotionally abused: Not on file    Physically abused: Not on file    Forced sexual activity: Not on file  Other Topics Concern   Not on file  Social History Narrative   Not on file    Allergies  Allergen Reactions    Oxycodone Other (See Comments)    confusion    Family History  Problem Relation Age of Onset   Endometrial cancer Sister 19       endometrial ca   Endometrial cancer Paternal Aunt 39   Breast cancer Paternal Aunt    Stomach cancer Maternal Grandfather        d. 8s   Prostate cancer Paternal Uncle    Breast cancer Cousin        pat first cousin   Breast cancer Cousin        pat first cousin's daughter   Lung cancer Father        lung ca   Heart  disease Mother    Stroke Paternal Grandmother    Endometrial cancer Cousin        paternal first cousin's daughter   Breast cancer Other        niece dx less than 47s   Breast cancer Other        niece, dx under 93      Prior to Admission medications   Medication Sig Start Date End Date Taking? Authorizing Provider  acetaminophen (TYLENOL) 500 MG tablet Take 500 mg by mouth every 6 (six) hours as needed for mild pain or headache.   Yes [provider]  amoxicillin-clavulanate (AUGMENTIN) 875-125 MG tablet Take 1 tablet by mouth 2 (two) times daily. 09/06/18  Yes Comer, Okey Regal, MD  aspirin 81 MG chewable tablet Chew 81 mg by mouth daily. 09/01/18  Yes [provider]  carvedilol (COREG) 25 MG tablet Take 25 mg by mouth 2 (two) times daily. 09/19/18 10/19/18 Yes [provider]  clopidogrel (PLAVIX) 75 MG tablet Take 75 mg by mouth daily.   Yes [provider]  feeding supplement (BOOST HIGH PROTEIN) LIQD Take 1 Container by mouth 2 (two) times daily between meals.   Yes [provider]  fluconazole (DIFLUCAN) 200 MG tablet Take 1 tablet (200 mg total) by mouth daily. 09/06/18  Yes Comer, Okey Regal, MD  magnesium oxide (MAG-OX) 400 MG tablet Take 400 mg by mouth 2 (two) times daily.    Yes [provider]  mirtazapine (REMERON) 30 MG tablet Take 1 tablet (30 mg total) by mouth at bedtime. Patient taking differently: Take 15 mg by mouth at bedtime. Taking  1/2 tablet at bedtime  05/13/18  Yes [name], MD  Pediatric Multiple Vit-C-FA (MULTIVITAMIN ANIMAL SHAPES, WITH CA/FA,) with C & FA chewable tablet Chew 1 tablet by mouth daily.   Yes [provider]  traMADol (ULTRAM) 50 MG tablet Take 1 tablet (50 mg total) by mouth every 6 (six) hours as needed for moderate pain. 08/10/18  Yes Ghimire, Henreitta Leber, MD  feeding supplement, ENSURE ENLIVE, (ENSURE ENLIVE) LIQD Take 237 mLs by mouth 2 (two) times daily between meals. Patient not taking: Reported on 10/03/2018 08/10/18   Jonetta Osgood, MD  megestrol (MEGACE) 400 MG/10ML suspension Take 10 mLs (400 mg total) by mouth daily. Patient not taking: Reported on 10/03/2018 08/10/18   Jonetta Osgood, MD    Physical Exam: Vitals:   10/03/18 1900 10/04/18 0358 10/04/18 1242 10/04/18 1300  BP:  138/70 135/65 115/76  Pulse:  90 95 94  Resp:  14    Temp:  98.7 F (37.1 C) 99.3 F (37.4 C) 98.2 F (36.8 C)  TempSrc:  Oral Oral Oral  SpO2:  100% 99% 100%  Weight: 47 kg         General:  Pleasantly resting in bed, No acute distress.  HEENT:  Normocephalic atraumatic.  Sclerae nonicteric, noninjected.  Extraocular movements intact bilaterally.  Neck:  Without mass or deformity.  Trachea is midline.  Lungs:  Clear to auscultate bilaterally without rhonchi, wheeze, or rales.  Heart:  Regular rate and rhythm.  Without murmurs, rubs, or gallops.  Abdomen:  Soft, nontender, nondistended.  Without guarding or rebound.  Left lower quadrant JP drain clean dry intact, JP/tubing appears brand-new without fluid or collection inside  Extremities: Without cyanosis, clubbing, edema, or obvious deformity.  Vascular:  Dorsalis pedis and posterior tibial pulses palpable bilaterally.  Skin:  Warm and dry, no erythema, no ulcerations.  Labs on Admission: I have personally reviewed following labs and imaging studies  CBC: Recent Labs  Lab 10/03/18 1310 10/04/18 0126  WBC 12.1* 11.2*  NEUTROABS 8.0*  --   HGB  8.1* 7.1*  HCT 26.1* 22.2*  MCV 68.7* 67.5*  PLT 399 244   Basic Metabolic Panel: Recent Labs  Lab 10/03/18 1310 10/04/18 0126  NA 136 138  K 4.1 3.8  CL 100 105  CO2 23 20*  GLUCOSE 111* 75  BUN 27* 18  CREATININE 1.21* 0.88  CALCIUM 9.6 9.2   GFR: Estimated Creatinine Clearance: 42.2 mL/min (by C-G formula based on SCr of 0.88 mg/dL). Liver Function Tests: Recent Labs  Lab 10/03/18 1310 10/04/18 0126  AST 21 18  ALT 24 21  ALKPHOS 62 53  BILITOT 0.5 0.8  PROT 7.7 6.8  ALBUMIN 2.4* 2.2*   No results for input(s): LIPASE, AMYLASE in the last 168 hours. No results for input(s): AMMONIA in the last 168 hours. Coagulation Profile: Recent Labs  Lab 10/04/18 0826  INR 1.5*   Cardiac Enzymes: No results for input(s): CKTOTAL, CKMB, CKMBINDEX, TROPONINI in the last 168 hours. BNP (last 3 results) No results for input(s): PROBNP in the last 8760 hours. HbA1C: No results for input(s): HGBA1C in the last 72 hours. CBG: No results for input(s): GLUCAP in the last 168 hours. Lipid Profile: No results for input(s): CHOL, HDL, LDLCALC, TRIG, CHOLHDL, LDLDIRECT in the last 72 hours. Thyroid Function Tests: No results for input(s): TSH, T4TOTAL, FREET4, T3FREE, THYROIDAB in the last 72 hours. Anemia Panel: No results for input(s): VITAMINB12, FOLATE, FERRITIN, TIBC, IRON, RETICCTPCT in the last 72 hours. Urine analysis:    Component Value Date/Time   COLORURINE YELLOW 10/03/2018 1649   APPEARANCEUR CLEAR 10/03/2018 1649   LABSPEC 1.040 (H) 10/03/2018 1649   PHURINE 7.0 10/03/2018 1649   GLUCOSEU NEGATIVE 10/03/2018 1649   HGBUR NEGATIVE 10/03/2018 1649   BILIRUBINUR NEGATIVE 10/03/2018 1649   KETONESUR NEGATIVE 10/03/2018 1649   PROTEINUR NEGATIVE 10/03/2018 1649   NITRITE NEGATIVE 10/03/2018 1649   LEUKOCYTESUR NEGATIVE 10/03/2018 1649    Creatinine Clearance: Estimated Creatinine Clearance: 42.2 mL/min (by C-G formula based on SCr of 0.88 mg/dL).  Recent  Results (from the past 240 hour(s))  Culture, blood (routine x 2)     Status: None (Preliminary result)   Collection Time: 10/03/18  2:00 PM  Result Value Ref Range Status   Specimen Description BLOOD RIGHT ANTECUBITAL  Final   Special Requests   Final    BOTTLES DRAWN AEROBIC AND ANAEROBIC Blood Culture adequate volume   Culture   Final    NO GROWTH < 24 HOURS Performed at [facility], [address], [place], [place] [address]    Report Status PENDING  Incomplete  Culture, blood (routine x 2)     Status: None (Preliminary result)   Collection Time: 10/03/18  2:25 PM  Result Value Ref Range Status   Specimen Description BLOOD LEFT ARM  Final   Special Requests   Final    BOTTLES DRAWN AEROBIC AND ANAEROBIC Blood Culture results may not be optimal due to an inadequate volume of blood received in culture bottles   Culture   Final    NO GROWTH < 24 HOURS Performed at [facility], [place] [address] [place], [place] [address]    Report Status PENDING  Incomplete     Radiological Exams on Admission: Ct Abdomen Pelvis W Contrast  Result Date: 10/03/2018  CLINICAL DATA:  Increased generalized weakness. Recent surgery for ovarian cancer with subsequent percutaneous drainage of left pelvic abscess. EXAM: CT ABDOMEN AND PELVIS WITH CONTRAST TECHNIQUE: Multidetector CT imaging of the abdomen and pelvis was performed using the standard protocol following bolus administration of intravenous contrast. CONTRAST:  51mL OMNIPAQUE IOHEXOL 300 MG/ML  SOLN COMPARISON:  CT 07/21/2018. FINDINGS: Lower chest: Interval improved aeration of the lung bases with resolution of the right pleural effusion. Interval stenting of the thoracoabdominal aorta, further described below. Coronary artery atherosclerosis noted. Hepatobiliary: The liver is normal in density without suspicious focal abnormality. No evidence of gallstones, gallbladder wall thickening or biliary dilatation. Pancreas: Unremarkable. No  pancreatic ductal dilatation or surrounding inflammatory changes. Spleen: Normal in size without focally suspicious finding. There are tiny low-density lesions posteriorly in the spleen on image 17/3 which are stable. Adrenals/Urinary Tract: Both adrenal glands appear normal. Stable small renal cysts, largest in the lower pole of the left kidney. No evidence of renal mass, urinary tract calculus or hydronephrosis. The bladder appears normal. Stomach/Bowel: Stable small periampullary duodenal diverticulum. The stomach and small bowel otherwise appear normal. There is a large amount of stool throughout the colon. There are loops of bowel which chronically protrude into postsurgical defects in the mid abdominal wall. These appear stable without evidence of incarceration. Sigmoid colon diverticulosis and wall thickening are similar to the previous study. Vascular/Lymphatic: There are no enlarged abdominal or pelvic lymph nodes. Interval stent grafting of the distal thoracic and proximal abdominal aorta. The superior extent of the stent is not imaged. It extends inferiorly to the renal arteries. There are fenestrations and stents in the proximal celiac trunk and superior mesenteric artery which appear patent. Contrast bolus is not optimal. The stent covers the previously demonstrated large saccular aneurysms which appear excluded from the aortic lumen. 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 No evidence of endograft leak. No new aneurysms are seen. The portal vein is patent. Reproductive: Hysterectomy.  No apparent adnexal mass. Other: Percutaneous left pelvic drain is in similar position, although was exchanged 5 days ago. Surrounding complex air-fluid collection with peripheral enhancement has enlarged, measuring 6.0 x 6.0 x 5.8 cm. The drain appears located in the  posterior aspect of this collection. No separate or other enlarging pelvic fluid collections identified. Musculoskeletal: No acute or significant osseous findings. IMPRESSION: 1. Interval significant enlargement of left pelvic abscess. The percutaneous drain appears well located within this collection, suggesting a persistent associated colonic fistula. 2. No new intraperitoneal fluid collections or definite signs of metastatic disease identified. 3. Interval stenting of the distal thoracic and proximal abdominal aorta with exclusion of the previously demonstrated saccular aneurysms. The right-sided aneurysm has not significantly in size, although no endograft leak identified. Attention on follow-up recommended. 4.  Aortic Atherosclerosis (ICD10-I70.0). Electronically Signed   By: Richardean Sale M.D.   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 Pelvic drain was recently replaced on 09/28/2018. Patient presents with increased weakness and follow-up CT demonstrated enlargement of the left pelvic abscess despite the percutaneous drain. Only a small amount of thick purulent fluid could be aspirated from the drain at the bedside. Plan for drain injection and possible exchange and up sizing. EXAM: ABSCESS DRAIN INJECTION WITH FLUOROSCOPY DRAIN EXCHANGE  FLUOROSCOPY MEDICATIONS: None ANESTHESIA/SEDATION: None COMPLICATIONS: None immediate. PROCEDURE: Informed written consent was obtained from the patient after a thorough discussion of the procedural risks, benefits and alternatives. All questions were addressed. Maximal Sterile Barrier Technique was utilized including caps, mask, sterile gowns, sterile gloves, sterile drape, hand hygiene and skin antiseptic. A timeout was performed prior to the initiation of the procedure. The left lower quadrant drain was prepped and draped in sterile fashion. Contrast  was injected into the tube with some difficulty. The tube appeared to be partially occluded. Subsequently, brown thick foul-smelling fluid was aspirated from the collection. The catheter was cut and removed over a Bentson wire. A new 12 French drain was advanced into the collection and approximately 75 mL of the thick brown fluid was removed. Follow-up drain injection was performed. Catheter was flushed with saline and attached to suction bulb. Skin was anesthetized with 1% lidocaine and the catheter was sutured to skin. FINDINGS: Old drain was well positioned within the abscess but not aspirating and flushing well. Contrast injection confirmed a persistent colonic fistula. After the drain was exchanged for a 12 French drain, 75 mL brown foul-smelling fluid was removed from the cavity. Drainage turned from the brown thick material to bloody material by the end of the procedure. IMPRESSION: 1. Residual left pelvic abscess collection with a colonic fistula. 2. Purulent stool-like material is draining from the pelvic abscess. The old drainage catheter was partially occluded, therefore, a new larger drain was placed. The patient currently has a 12 [place] drain. 3. Continue with routine flushing and will keep the drain to suction bulb in order to keep the pelvic abscess cavity decompressed. 4. Long-term plan for the drain is uncertain at this time. Electronically Signed   By: Markus Daft M.D.   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 Pelvic drain was recently replaced on 09/28/2018. Patient presents with increased weakness and follow-up CT demonstrated enlargement of the left pelvic abscess despite the percutaneous drain. Only a small amount of thick purulent fluid could be aspirated from the drain at the bedside. Plan for drain injection and possible exchange and up sizing. EXAM: ABSCESS DRAIN INJECTION  WITH FLUOROSCOPY DRAIN EXCHANGE FLUOROSCOPY MEDICATIONS: None ANESTHESIA/SEDATION: None COMPLICATIONS: None immediate. PROCEDURE: Informed written consent was obtained from the patient after a thorough discussion of the procedural risks, benefits and alternatives. All questions were addressed. Maximal Sterile Barrier Technique was utilized including caps, mask, sterile gowns, sterile gloves, sterile drape, hand hygiene and skin antiseptic. A timeout was performed prior to the initiation of the procedure. The left lower quadrant drain was prepped and draped in sterile fashion. Contrast was injected into the tube with some difficulty. The tube appeared to be partially occluded. Subsequently, brown thick foul-smelling fluid was aspirated from the collection. The catheter was cut and removed over a Bentson wire. A new 12 French drain was advanced into the collection and approximately 75 mL of the thick brown fluid was removed. Follow-up drain injection was performed. Catheter was flushed with saline and attached to suction bulb. Skin was anesthetized with 1% lidocaine and the catheter was sutured to skin. FINDINGS: Old drain was well positioned within the abscess but not aspirating and flushing well. Contrast injection confirmed a persistent colonic fistula. After the drain was exchanged for a 12 French drain, 75 mL brown foul-smelling fluid was removed from the cavity. Drainage turned from the brown thick  material to bloody material by the end of the procedure. IMPRESSION: 1. Residual left pelvic abscess collection with a colonic fistula. 2. Purulent stool-like material is draining from the pelvic abscess. The old drainage catheter was partially occluded, therefore, a new larger drain was placed. The patient currently has a 12 [place] drain. 3. Continue with routine flushing and will keep the drain to suction bulb in order to keep the pelvic abscess cavity decompressed. 4. Long-term plan for the drain is uncertain at  this time. Electronically Signed   By: Markus Daft M.D.   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 -Discharged home on Invanz and fluconazole from [facility] for a total of 6 weeks - stop date 4/./20.  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 -Abdominal drain recently replaced 09/28/2018 - her prior old drain was completely dislodged and 30 mL of purulent fluid was drained at the time of replacement of drain - I do not see cultures on this in our system. -CT at admission shows enlargement of the previously noted left pelvic abscess with a drain in place. -Surgery,IR, ID following along - hold Abx per ID and follow cultures - new drain to be placed later this morning - will culture fluid and follow clinically -Patient remains without leukocytosis/fever - does NOT meet sepsis/sirs criteria -Unclear at this point if surgery is an option given essentially nonhealing chronic abdominal abscess likely secondary to colonic perforation/fistula -however given her age, comorbid conditions and profound protein caloric malnutrition patient remains a poor surgical candidate. -Patient appears non-toxic and other than abdominal pain has no real complaints   AKI, likely pre-renal -Continue IV fluids and  encourage increased p.o. intake  -Follow with morning labs  HTN, essential, well controlled -Blood pressure borderline low, continue to hold home medications   Failure to thrive in an adult with concurrent severe protein caloric malnutrition, POA -Patient continues to have extremely poor p.o. intake, states she has "no appetite"  -Discussed with dietary/nutrition, patient will likely benefit from supplement  -Multiple previous discussions about goals of care, continues to refuse discussion about palliative or hospice patient's essentially chronic infection and worsening status  Anemia acute on chronic  Likely acute dilutional anemia on chronic anemia of chronic disease versus malnutrition given above  Continue to follow with morning labs, no overt signs or symptoms of bleeding currently Baseline appears to be around 10 a month ago early downtrending - 8.1-->7.1  Stage IV ovarian cancer with peritoneal carcinomatosis -Status post debulking following neoadjuvant chemotherapy.  -Postop course was complicated by sepsis, suspicion for perforation, respiratory failure.  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   Weight as of this encounter: 47 kg.   DVT prophylaxis: lovenox Code Status: Full code -discussed and confirmed with son [name]  Family Communication: Discussed with son [name] Disposition Plan: Pending clinical improvement Consults called: Will call ID IR general surgery has been informed by the ED physician Admission status:  Inpatient  Little Ishikawa MD [facility]  If 7PM-7AM, please contact  night-coverage [email or web address] Password [facility]  10/04/2018, 3:55 PM

## 2018-10-04 NOTE — Progress Notes (Signed)
Pharmacy Antibiotic Note  Jo Parker is a 73 y.o. female admitted on 10/03/2018 for recurrent abdominal pain and fever with Intra-abdominal infection. Patient has a drain placed by internetional radiology that is draining yellow colored liquid for several days and has h/o pelvic abscess with colonic fistula. CT abdomen shows evidence of significant enlargement of the left pelvic abscess. Patient was previously treated with 6 weeks of Invanz and fluconazole. Pharmacy has been consulted for Invanz dosing. Admit Scr 1.21 (BL 0.7) with CrCl 30.7  4/28: SCr decreased to 0.88, Cl > 30 ml/min  Plan: Invanz 1000mg  x1 dose given on 4/27 - continue 1gm daily  Weight: 103 lb 9.9 oz (47 kg)  Temp (24hrs), Avg:98.2 F (36.8 C), Min:97.6 F (36.4 C), Max:98.7 F (37.1 C)  Recent Labs  Lab 10/03/18 1310 10/04/18 0126  WBC 12.1* 11.2*  CREATININE 1.21* 0.88  LATICACIDVEN 1.7  --     Estimated Creatinine Clearance: 42.2 mL/min (by C-G formula based on SCr of 0.88 mg/dL).    Allergies  Allergen Reactions  . Oxycodone Other (See Comments)    confusion   Antimicrobials this admission: 4/27 Invanz >>   Dose Adjustments: 4/28 Ertapenem 500mg  > 1gm q24, renal fx improved  Microbiology results: 4/27 BCx: sent  Thank you for allowing pharmacy to be a part of this patient's care.  Minda Ditto PharmD Phone [phone] (till 5:30pm) 10/04/2018, 8:42 AM

## 2018-10-04 NOTE — Progress Notes (Addendum)
Dr. Skeet Latch at [facility] aware of patient's admission, CT results, labs, and current situation.  Recommendation for IR evaluation for abscess drainage.  Surgery not a feasible option at this time per Dr. Skeet Latch. Will continue to follow and watch for IR input.  Update 11:34 am: Dr. Skeet Latch made aware of IR evaluation and recommendation for drain exchange/upsize. She would like to monitor response/output after the exchange.

## 2018-10-05 DIAGNOSIS — I10 Essential (primary) hypertension: Secondary | ICD-10-CM

## 2018-10-05 DIAGNOSIS — E43 Unspecified severe protein-calorie malnutrition: Secondary | ICD-10-CM

## 2018-10-05 DIAGNOSIS — D539 Nutritional anemia, unspecified: Secondary | ICD-10-CM

## 2018-10-05 DIAGNOSIS — E785 Hyperlipidemia, unspecified: Secondary | ICD-10-CM

## 2018-10-05 DIAGNOSIS — C482 Malignant neoplasm of peritoneum, unspecified: Secondary | ICD-10-CM

## 2018-10-05 LAB — COMPREHENSIVE METABOLIC PANEL
ALT: 18 U/L (ref 0–44)
AST: 20 U/L (ref 15–41)
Albumin: 2.1 g/dL — ABNORMAL LOW (ref 3.5–5.0)
Alkaline Phosphatase: 57 U/L (ref 38–126)
Anion gap: 11 (ref 5–15)
BUN: 12 mg/dL (ref 8–23)
CO2: 20 mmol/L — ABNORMAL LOW (ref 22–32)
Calcium: 8.8 mg/dL — ABNORMAL LOW (ref 8.9–10.3)
Chloride: 106 mmol/L (ref 98–111)
Creatinine, Ser: 0.85 mg/dL (ref 0.44–1.00)
GFR calc Af Amer: >60 mL/min (ref >60)
GFR calc non Af Amer: >60 mL/min (ref >60)
Glucose, Bld: 109 mg/dL — ABNORMAL HIGH (ref 70–99)
Potassium: 3.6 mmol/L (ref 3.5–5.1)
Sodium: 137 mmol/L (ref 135–145)
Total Bilirubin: 0.5 mg/dL (ref 0.3–1.2)
Total Protein: 6.7 g/dL (ref 6.5–8.1)

## 2018-10-05 LAB — CBC
HCT: 22.3 % — ABNORMAL LOW (ref 36.0–46.0)
Hemoglobin: 7.2 g/dL — ABNORMAL LOW (ref 12.0–15.0)
MCH: 21.7 pg — ABNORMAL LOW (ref 26.0–34.0)
MCHC: 32.3 g/dL (ref 30.0–36.0)
MCV: 67.2 fL — ABNORMAL LOW (ref 80.0–100.0)
Platelets: 337 10*3/uL (ref 150–400)
RBC: 3.32 MIL/uL — ABNORMAL LOW (ref 3.87–5.11)
RDW: 19.7 % — ABNORMAL HIGH (ref 11.5–15.5)
WBC: 9 10*3/uL (ref 4.0–10.5)
nRBC: 0 % (ref 0.0–0.2)

## 2018-10-05 MED ORDER — SODIUM CHLORIDE 0.9 % IJ SOLN: 10.0000 mL | Freq: Every day | INTRAMUSCULAR | 1 refills | Status: AC

## 2018-10-05 NOTE — Progress Notes (Signed)
PROGRESS NOTE    Jo Parker  KGM:[id number] DOB: Jul 21, 1945 DOA: 10/03/2018 PCP: No primary care provider on file.   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  Patient still has a drain in place son reports a drain was placed by interventional radiology at [facility].  It is draining yellow color liquid has been that way for many days.  Patient was treated with 6 weeks of Invanz and fluconazole.  Patient also has pelvic abscess with colonic fistula.  Son reports that patient has lost significant amount of weight with decreased appetite generalized weakness unable to ambulate with no nausea or vomiting or diarrhea.  Denies any chest pain shortness of breath or cough.  Patient was admitted to the hospital 08/05/2018 discharged 08/10/2018 discharged on fluconazole and ertapenem completed on 09/09/2018  ED Course: received invanz x1.  CT of the abdomen and pelvis showed significant enlargement of the left pelvic abscess.  Percutaneous drain appears well located within this collection staying a persistent associated colonic fistula.  No new intraperitoneal fluid collections or definite signs of metastatic disease.  Will stenting of the distal thoracic and proximal abdominal aorta with exclusion of the previously demonstrated saccular aneurysm.   Echo 07/04/2018 no evidence of endocarditis Review of Systems see HPI Ambulatory Status: Patient baseline walks inside the house very small distances.   Assessment & Plan:   Active Problems:   Primary peritoneal carcinomatosis ([name])   Severe protein-calorie malnutrition ([name])   Deficiency anemia   HLD (hyperlipidemia)   Essential hypertension   Pelvic abscess in female  Generalized weakness   Intra-abdominal abscess ([name])   Generalized abdominal pain   Chronic intra-abdominal abscess, POA: Surgery IR and ID following.  I discussed case with IR with plans to keep drain in place with 2-week follow-up, ID recommended continuing to hold antibiotics follow cultures.  Patient is a very poor surgical candidate and risk is very high surgery deferred for now and unlikely in the long-term  Acute kidney injury I agree this is likely prerenal in nature, BUN and creatinine are twelve 0.85 continue to follow labs  Hypertension continue to hold blood pressure medications for now, if remains in the 120s to 130s we will slowly titrate back in  Failure to thrive in adult with concurrent severe protein calorie malnutrition.  As previously noted patient continues to have poor p.o. intake second having no appetite.  Patient declines palliative care or hospice care despite declining status and decreased appetite encourage supplements encouraged p.o. intake.  Stage IV ovarian cancer with peritoneal carcinomatosis.  As previously noted patient status post debulking with neoadjuvant chemotherapy.  Patient had a very complicated treatment course include sepsis concern for perforation respiratory failure.  OB/GYN is with consultation appreciate their input.  Mycotic aortic aneurysm with stent placed [facility] present admission.  No current intervention continue to follow clinically will defer to [facility] surgery for follow-up    DVT prophylaxis: Lovenox SQ  Code Status: FULL    Code Status Orders  (From admission, onward)         Start     Ordered   10/03/18 1807  Full code  Continuous     10/03/18 1807        Code Status History    Date Active Date  Inactive Code Status Order ID Comments User Context   08/05/2018 2007 08/10/2018 2302 Full Code [id number]  Elwyn Reach, MD Inpatient   07/23/2018 0928 07/23/2018 1603 Full Code [id number]  Karmen Bongo, MD Inpatient    06/30/2018 2202 07/07/2018 1637 Full Code [id number]  Shelbie Proctor, MD Inpatient   03/04/2018 1333 03/23/2018 1820 Full Code [id number]  Frankey Poot Inpatient   02/13/2018 1315 03/04/2018 1148 Full Code [id number]  Marshell Garfinkel, MD Inpatient   02/13/2018 0949 02/13/2018 1315 DNR [id number]  Marshell Garfinkel, MD Inpatient   01/27/2018 1244 02/13/2018 0949 Full Code [id number]  Lahoma Crocker, MD Inpatient   10/23/2017 0859 10/27/2017 1655 Full Code [id number]  Purohit, Konrad Dolores, MD ED     Family Communication: Discussed with son [name] Disposition Plan:   Patient will remain inpatient additional day to monitor drain output, continue to monitor renal function we will continue with IV fluids.  Anticipate likely discharge tomorrow if no further complications Consults called: None Admission status: Inpatient   Consultants:   OB/GYN, interventional radiology, general surgery  Procedures:  Ct Abdomen Pelvis W Contrast  Result Date: 10/03/2018 CLINICAL DATA:  Increased generalized weakness. Recent surgery for ovarian cancer with subsequent percutaneous drainage of left pelvic abscess. EXAM: CT ABDOMEN AND PELVIS WITH CONTRAST TECHNIQUE: Multidetector CT imaging of the abdomen and pelvis was performed using the standard protocol following bolus administration of intravenous contrast. CONTRAST:  5mL OMNIPAQUE IOHEXOL 300 MG/ML  SOLN COMPARISON:  CT 07/21/2018. FINDINGS: Lower chest: Interval improved aeration of the lung bases with resolution of the right pleural effusion. Interval stenting of the thoracoabdominal aorta, further described below. Coronary artery atherosclerosis noted. Hepatobiliary: The liver is normal in density without suspicious focal abnormality. No evidence of gallstones, gallbladder wall thickening or biliary dilatation. Pancreas: Unremarkable. No pancreatic ductal dilatation or surrounding inflammatory changes. Spleen: Normal in size without focally suspicious finding. There are tiny  low-density lesions posteriorly in the spleen on image 17/3 which are stable. Adrenals/Urinary Tract: Both adrenal glands appear normal. Stable small renal cysts, largest in the lower pole of the left kidney. No evidence of renal mass, urinary tract calculus or hydronephrosis. The bladder appears normal. Stomach/Bowel: Stable small periampullary duodenal diverticulum. The stomach and small bowel otherwise appear normal. There is a large amount of stool throughout the colon. There are loops of bowel which chronically protrude into postsurgical defects in the mid abdominal wall. These appear stable without evidence of incarceration. Sigmoid colon diverticulosis and wall thickening are similar to the previous study. Vascular/Lymphatic: There are no enlarged abdominal or pelvic lymph nodes. Interval stent grafting of the distal thoracic and proximal abdominal aorta. The superior extent of the stent is not imaged. It extends inferiorly to the renal arteries. There are fenestrations and stents in the proximal celiac trunk and superior mesenteric artery which appear patent. Contrast bolus is not optimal. The stent covers the previously demonstrated large saccular aneurysms which appear excluded from the aortic lumen. 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 No evidence of endograft leak. No new aneurysms are seen. The portal vein is patent. Reproductive: Hysterectomy.  No apparent adnexal mass. Other: Percutaneous left pelvic drain is in similar position, although was exchanged 5 days ago. Surrounding complex air-fluid collection with peripheral enhancement has enlarged, measuring 6.0 x 6.0 x 5.8 cm. The drain appears located in the posterior aspect of this  collection. No separate or other enlarging pelvic fluid collections identified. Musculoskeletal: No acute or  significant osseous findings. IMPRESSION: 1. Interval significant enlargement of left pelvic abscess. The percutaneous drain appears well located within this collection, suggesting a persistent associated colonic fistula. 2. No new intraperitoneal fluid collections or definite signs of metastatic disease identified. 3. Interval stenting of the distal thoracic and proximal abdominal aorta with exclusion of the previously demonstrated saccular aneurysms. The right-sided aneurysm has not significantly in size, although no endograft leak identified. Attention on follow-up recommended. 4.  Aortic Atherosclerosis (ICD10-I70.0). Electronically Signed   By: Richardean Sale M.D.   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 Pelvic drain was recently replaced on 09/28/2018. Patient presents with increased weakness and follow-up CT demonstrated enlargement of the left pelvic abscess despite the percutaneous drain. Only a small amount of thick purulent fluid could be aspirated from the drain at the bedside. Plan for drain injection and possible exchange and up sizing. EXAM: ABSCESS DRAIN INJECTION WITH FLUOROSCOPY DRAIN EXCHANGE FLUOROSCOPY MEDICATIONS: None ANESTHESIA/SEDATION: None COMPLICATIONS: None immediate. PROCEDURE: Informed written consent was obtained from the patient after a thorough discussion of the procedural risks, benefits and alternatives. All questions were addressed. Maximal Sterile Barrier Technique was utilized including caps, mask, sterile gowns, sterile gloves, sterile drape, hand hygiene and skin antiseptic. A timeout was performed prior to the initiation of the procedure. The left lower quadrant drain was prepped and draped in sterile fashion. Contrast was injected into the tube with some difficulty. The tube appeared to be partially occluded. Subsequently, brown thick foul-smelling  fluid was aspirated from the collection. The catheter was cut and removed over a Bentson wire. A new 12 French drain was advanced into the collection and approximately 75 mL of the thick brown fluid was removed. Follow-up drain injection was performed. Catheter was flushed with saline and attached to suction bulb. Skin was anesthetized with 1% lidocaine and the catheter was sutured to skin. FINDINGS: Old drain was well positioned within the abscess but not aspirating and flushing well. Contrast injection confirmed a persistent colonic fistula. After the drain was exchanged for a 12 French drain, 75 mL brown foul-smelling fluid was removed from the cavity. Drainage turned from the brown thick material to bloody material by the end of the procedure. IMPRESSION: 1. Residual left pelvic abscess collection with a colonic fistula. 2. Purulent stool-like material is draining from the pelvic abscess. The old drainage catheter was partially occluded, therefore, a new larger drain was placed. The patient currently has a 12 [place] drain. 3. Continue with routine flushing and will keep the drain to suction bulb in order to keep the pelvic abscess cavity decompressed. 4. Long-term plan for the drain is uncertain at this time. Electronically Signed   By: Markus Daft M.D.   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 Pelvic drain was recently replaced on 09/28/2018. Patient presents with increased weakness and follow-up CT demonstrated enlargement of the left pelvic abscess despite the percutaneous drain. Only a small amount of thick purulent fluid could be aspirated from the drain at the bedside. Plan for drain injection and possible exchange and up sizing. EXAM: ABSCESS DRAIN INJECTION WITH FLUOROSCOPY DRAIN EXCHANGE FLUOROSCOPY MEDICATIONS: None ANESTHESIA/SEDATION: None COMPLICATIONS: None immediate. PROCEDURE:  Informed written consent was obtained from  the patient after a thorough discussion of the procedural risks, benefits and alternatives. All questions were addressed. Maximal Sterile Barrier Technique was utilized including caps, mask, sterile gowns, sterile gloves, sterile drape, hand hygiene and skin antiseptic. A timeout was performed prior to the initiation of the procedure. The left lower quadrant drain was prepped and draped in sterile fashion. Contrast was injected into the tube with some difficulty. The tube appeared to be partially occluded. Subsequently, brown thick foul-smelling fluid was aspirated from the collection. The catheter was cut and removed over a Bentson wire. A new 12 French drain was advanced into the collection and approximately 75 mL of the thick brown fluid was removed. Follow-up drain injection was performed. Catheter was flushed with saline and attached to suction bulb. Skin was anesthetized with 1% lidocaine and the catheter was sutured to skin. FINDINGS: Old drain was well positioned within the abscess but not aspirating and flushing well. Contrast injection confirmed a persistent colonic fistula. After the drain was exchanged for a 12 French drain, 75 mL brown foul-smelling fluid was removed from the cavity. Drainage turned from the brown thick material to bloody material by the end of the procedure. IMPRESSION: 1. Residual left pelvic abscess collection with a colonic fistula. 2. Purulent stool-like material is draining from the pelvic abscess. The old drainage catheter was partially occluded, therefore, a new larger drain was placed. The patient currently has a 12 [place] drain. 3. Continue with routine flushing and will keep the drain to suction bulb in order to keep the pelvic abscess cavity decompressed. 4. Long-term plan for the drain is uncertain at this time. Electronically Signed   By: Markus Daft M.D.   On: 10/04/2018 13:39   Ir Catheter Tube Change  Result Date:  09/28/2018 INDICATION: 73 year old with history of peritoneal adenocarcinoma with a chronic abscess collection along the left pelvic sidewall associated with a colonic fistula. Percutaneous drain in this area since 04/18/2018. Patient reports stool-like material leaking around the drain site. EXAM: DRAIN INJECTION WITH FLUOROSCOPY ABSCESS DRAIN EXCHANGE WITH FLUOROSCOPY MEDICATIONS: Patient is reportedly on oral antibiotics. ANESTHESIA/SEDATION: None COMPLICATIONS: None immediate. PROCEDURE: The drain dressings were removed. The radiopaque marker was outside of the body and one sidehole was outside of the body. Tip of the catheter was still underneath the skin. Informed written consent was obtained from the patient after a thorough discussion of the procedural risks, benefits and alternatives. All questions were addressed. Maximal Sterile Barrier Technique was utilized including caps, mask, sterile gowns, sterile gloves, sterile drape, hand hygiene and skin antiseptic. A timeout was performed prior to the initiation of the procedure. The catheter and surrounding skin was prepped and draped in sterile fashion. Contrast was injected through the tube. The catheter was cut and removed over a Bentson wire. Kumpe catheter was advanced into the pelvis. Additional contrast was injected. Catheter was removed over the wire and a new 10.2 [place] multipurpose drain was advanced over the wire. Approximately 30 mL of pink purulent fluid was removed from the pelvic abscess collection. Collection was irrigated with sterile saline. Follow-up drain injection was performed. Catheter was flushed and attached to gravity bag. Skin was anesthetized with 1% lidocaine and the catheter was secured to the skin with a suture and StatLock device. FINDINGS: Drainage catheter was partially dislodged with a side hole outside of the body. New drain was successfully advanced back into the pelvic collection and 30 mL of purulent fluid was removed.  The abscess collection appear to be decompressed at  the end of the procedure Drain injection confirmed a chronic colonic fistula. IMPRESSION: Successful exchange of the abscess drainage catheter. Approximately 30 mL of purulent fluid was removed from the chronic pelvic collection. Abscess cavity was decompressed at the end of the procedure. Persistent colonic fistula. Electronically Signed   By: Markus Daft M.D.   On: 09/28/2018 16:18   Dg Sinus/fist Tube Chk-non Gi  Result Date: 09/21/2018 INDICATION: Left pelvic abscess with persistent fistula to the adjacent sigmoid colon. EXAM: INJECTION OF PRE-EXISTING PERCUTANEOUS ABSCESS DRAIN UNDER FLUOROSCOPY MEDICATIONS: No additional medications. ANESTHESIA/SEDATION: None CONTRAST:  80 mL Omnipaque 300 FLUOROSCOPY TIME:  10 seconds.  3.2 mGy. COMPLICATIONS: None immediate. PROCEDURE: Informed written consent was obtained from the patient after a thorough discussion of the procedural risks, benefits and alternatives. All questions were addressed. Maximal Sterile Barrier Technique was utilized including caps, mask, sterile gowns, sterile gloves, sterile drape, hand hygiene and skin antiseptic. A timeout was performed prior to the initiation of the procedure. Contrast was injected via the pre-existing percutaneous drain. Fluoroscopic spot images were obtained. The drain was flushed and connected to a gravity drainage bag. FINDINGS: There remains a small triangular shaped decompressed abscess cavity in the left lateral lower pelvis. Superior fistula again noted directly communicating with the lumen of the sigmoid colon. Findings are similar to prior injection studies including the most recent dated 07/20/2018. 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 Electronically Signed   By: Aletta Edouard M.D.   On: 09/21/2018 12:42     Antimicrobials:   None   Subjective: Patient mildly  confused but answering questions appropriately on my evaluation.  Reports feeling generally weak but no acute change, no current abdominal pain as long as she is compliant with pain medications.  Objective: Vitals:   10/04/18 1300 10/04/18 1939 10/05/18 0400 10/05/18 1459  BP: 115/76 (!) 123/48 134/72 (!) 145/66  Pulse: 94 91 83 97  Resp:  19 18   Temp: 98.2 F (36.8 C) 98.1 F (36.7 C) 99 F (37.2 C) 99.2 F (37.3 C)  TempSrc: Oral Oral Oral Oral  SpO2: 100% 95% 100% 100%  Weight:        Intake/Output Summary (Last 24 hours) at 10/05/2018 1620 Last data filed at 10/05/2018 1500 Gross per 24 hour  Intake 440 ml  Output 55 ml  Net 385 ml   Filed Weights   10/03/18 1900  Weight: 47 kg    Examination:  General exam: Appears calm and comfortable  Respiratory system: Clear to auscultation. Respiratory effort normal. Cardiovascular system: S1 & S2 heard, RRR. No JVD, murmurs, rubs, gallops or clicks. No pedal edema. Gastrointestinal system: Abdomen is nondistended, soft and nontender.  JP drain in left lower quadrant scant fluid serosanguineous  Central nervous system: Alert and oriented. No focal neurological deficits. Extremities: Symmetric 5 x 5 power.  No contractures Skin: No rashes, lesions or ulcers Psychiatry: . Mood & affect appropriate.  Slowed cognition consistent with dementia    Data Reviewed: I have personally reviewed following labs and imaging studies  CBC: Recent Labs  Lab 10/03/18 1310 10/04/18 0126 10/05/18 0133  WBC 12.1* 11.2* 9.0  NEUTROABS 8.0*  --   --   HGB 8.1* 7.1* 7.2*  HCT 26.1* 22.2* 22.3*  MCV 68.7* 67.5* 67.2*  PLT 399 334 094   Basic Metabolic Panel: Recent Labs  Lab 10/03/18 1310 10/04/18 0126 10/05/18 0133  NA 136 138 137  K 4.1 3.8 3.6  CL 100 105 106  CO2 23 20* 20*  GLUCOSE 111* 75 109*  BUN 27* 18 12  CREATININE 1.21* 0.88 0.85  CALCIUM 9.6 9.2 8.8*   GFR: Estimated Creatinine Clearance: 43.7 mL/min (by C-G  formula based on SCr of 0.85 mg/dL). Liver Function Tests: Recent Labs  Lab 10/03/18 1310 10/04/18 0126 10/05/18 0133  AST 21 18 20   ALT 24 21 18   ALKPHOS 62 53 57  BILITOT 0.5 0.8 0.5  PROT 7.7 6.8 6.7  ALBUMIN 2.4* 2.2* 2.1*   No results for input(s): LIPASE, AMYLASE in the last 168 hours. No results for input(s): AMMONIA in the last 168 hours. Coagulation Profile: Recent Labs  Lab 10/04/18 0826  INR 1.5*   Cardiac Enzymes: No results for input(s): CKTOTAL, CKMB, CKMBINDEX, TROPONINI in the last 168 hours. BNP (last 3 results) No results for input(s): PROBNP in the last 8760 hours. HbA1C: No results for input(s): HGBA1C in the last 72 hours. CBG: No results for input(s): GLUCAP in the last 168 hours. Lipid Profile: No results for input(s): CHOL, HDL, LDLCALC, TRIG, CHOLHDL, LDLDIRECT in the last 72 hours. Thyroid Function Tests: No results for input(s): TSH, T4TOTAL, FREET4, T3FREE, THYROIDAB in the last 72 hours. Anemia Panel: No results for input(s): VITAMINB12, FOLATE, FERRITIN, TIBC, IRON, RETICCTPCT in the last 72 hours. Sepsis Labs: Recent Labs  Lab 10/03/18 1310  LATICACIDVEN 1.7    Recent Results (from the past 240 hour(s))  Culture, blood (routine x 2)     Status: None (Preliminary result)   Collection Time: 10/03/18  2:00 PM  Result Value Ref Range Status   Specimen Description BLOOD RIGHT ANTECUBITAL  Final   Special Requests   Final    BOTTLES DRAWN AEROBIC AND ANAEROBIC Blood Culture adequate volume   Culture   Final    NO GROWTH 2 DAYS Performed at [facility], [place] [address], [place], [place] [address]    Report Status PENDING  Incomplete  Culture, blood (routine x 2)     Status: None (Preliminary result)   Collection Time: 10/03/18  2:25 PM  Result Value Ref Range Status   Specimen Description BLOOD LEFT ARM  Final   Special Requests   Final    BOTTLES DRAWN AEROBIC AND ANAEROBIC Blood Culture results may not be optimal due to an  inadequate volume of blood received in culture bottles   Culture   Final    NO GROWTH 2 DAYS Performed at [facility], [place] [address], [place], [place] [address]    Report Status PENDING  Incomplete         Radiology Studies: Ir Sinus/fist Tube Chk-non Gi  Result Date: 10/04/2018 INDICATION: 73 year old with a chronic left pelvic abscess and colonic fistula. Pelvic drain was recently replaced on 09/28/2018. Patient presents with increased weakness and follow-up CT demonstrated enlargement of the left pelvic abscess despite the percutaneous drain. Only a small amount of thick purulent fluid could be aspirated from the drain at the bedside. Plan for drain injection and possible exchange and up sizing. EXAM: ABSCESS DRAIN INJECTION WITH FLUOROSCOPY DRAIN EXCHANGE FLUOROSCOPY MEDICATIONS: None ANESTHESIA/SEDATION: None COMPLICATIONS: None immediate. PROCEDURE: Informed written consent was obtained from the patient after a thorough discussion of the procedural risks, benefits and alternatives. All questions were addressed. Maximal Sterile Barrier Technique was utilized including caps, mask, sterile gowns, sterile gloves, sterile drape, hand hygiene and skin antiseptic. A timeout was performed prior to the initiation  of the procedure. The left lower quadrant drain was prepped and draped in sterile fashion. Contrast was injected into the tube with some difficulty. The tube appeared to be partially occluded. Subsequently, brown thick foul-smelling fluid was aspirated from the collection. The catheter was cut and removed over a Bentson wire. A new 12 French drain was advanced into the collection and approximately 75 mL of the thick brown fluid was removed. Follow-up drain injection was performed. Catheter was flushed with saline and attached to suction bulb. Skin was anesthetized with 1% lidocaine and the catheter was sutured to skin. FINDINGS: Old drain was well positioned within the abscess but not  aspirating and flushing well. Contrast injection confirmed a persistent colonic fistula. After the drain was exchanged for a 12 French drain, 75 mL brown foul-smelling fluid was removed from the cavity. Drainage turned from the brown thick material to bloody material by the end of the procedure. IMPRESSION: 1. Residual left pelvic abscess collection with a colonic fistula. 2. Purulent stool-like material is draining from the pelvic abscess. The old drainage catheter was partially occluded, therefore, a new larger drain was placed. The patient currently has a 12 [place] drain. 3. Continue with routine flushing and will keep the drain to suction bulb in order to keep the pelvic abscess cavity decompressed. 4. Long-term plan for the drain is uncertain at this time. Electronically Signed   By: Markus Daft M.D.   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 Pelvic drain was recently replaced on 09/28/2018. Patient presents with increased weakness and follow-up CT demonstrated enlargement of the left pelvic abscess despite the percutaneous drain. Only a small amount of thick purulent fluid could be aspirated from the drain at the bedside. Plan for drain injection and possible exchange and up sizing. EXAM: ABSCESS DRAIN INJECTION WITH FLUOROSCOPY DRAIN EXCHANGE FLUOROSCOPY MEDICATIONS: None ANESTHESIA/SEDATION: None COMPLICATIONS: None immediate. PROCEDURE: Informed written consent was obtained from the patient after a thorough discussion of the procedural risks, benefits and alternatives. All questions were addressed. Maximal Sterile Barrier Technique was utilized including caps, mask, sterile gowns, sterile gloves, sterile drape, hand hygiene and skin antiseptic. A timeout was performed prior to the initiation of the procedure. The left lower quadrant drain was prepped and draped in sterile fashion. Contrast was  injected into the tube with some difficulty. The tube appeared to be partially occluded. Subsequently, brown thick foul-smelling fluid was aspirated from the collection. The catheter was cut and removed over a Bentson wire. A new 12 French drain was advanced into the collection and approximately 75 mL of the thick brown fluid was removed. Follow-up drain injection was performed. Catheter was flushed with saline and attached to suction bulb. Skin was anesthetized with 1% lidocaine and the catheter was sutured to skin. FINDINGS: Old drain was well positioned within the abscess but not aspirating and flushing well. Contrast injection confirmed a persistent colonic fistula. After the drain was exchanged for a 12 French drain, 75 mL brown foul-smelling fluid was removed from the cavity. Drainage turned from the brown thick material to bloody material by the end of the procedure. IMPRESSION: 1. Residual left pelvic abscess collection with a colonic fistula. 2. Purulent stool-like material is draining from the pelvic abscess. The old drainage catheter was partially occluded, therefore, a new larger drain was placed. The patient currently has a 12 [place] drain. 3. Continue with routine flushing and will  keep the drain to suction bulb in order to keep the pelvic abscess cavity decompressed. 4. Long-term plan for the drain is uncertain at this time. Electronically Signed   By: Markus Daft M.D.   On: 10/04/2018 13:39        Scheduled Meds:  enoxaparin (LOVENOX) injection  40 mg Subcutaneous Q24H   sodium chloride flush  5 mL Intracatheter Q8H   Continuous Infusions:  sodium chloride 25 mL/hr at 10/05/18 1331     LOS: 2 days    Time spent: [place], MD [facility]  If 7PM-7AM, please contact night-coverage  10/05/2018, 4:20 PM

## 2018-10-05 NOTE — Evaluation (Signed)
Physical Therapy Evaluation Patient Details Name: Jo Parker MRN: [id number] DOB: Jan 01, 1946 Today's Date: 10/05/2018   History of Present Illness  Pt is a 73 y.o. 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 Recent admission 06/2018 and 08/2018 with related issues. 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    Clinical Impression  Pt presents with an overall decrease in functional mobility secondary to above. PTA, pt requiring assist from family for ADLs and mobility with rollator and wheelchair; has been working with [place] services. Today, pt requiring minA for limited mobility; pt limited by generalized weakness, decreased activity tolerance and abdominal pain. Pt would benefit from continued acute PT services to maximize functional mobility and independence prior to d/c with continued [facility] services and assist from family.     Follow Up Recommendations Home health PT;Supervision/Assistance - 24 hour    Equipment Recommendations  None recommended by PT    Recommendations for Other Services       Precautions / Restrictions Precautions Precautions: Fall;Other (comment) Precaution Comments: Abdominal precautions; L abdomen/pelvic JP drain Restrictions Weight Bearing Restrictions: No      Mobility  Bed Mobility Overal bed mobility: Needs Assistance Bed Mobility: Supine to Sit     Supine to sit: Mod assist     General bed mobility comments: Pt reports no time for log roll technique due to needing to use BSC; modA to assist trunk elevation and scoot hips to EOB, cues for sequencing as pt trying to bring legs back into bed  Transfers Overall transfer  level: Needs assistance Equipment used: 4-wheeled walker Transfers: Sit to/from Stand Sit to Stand: Min assist         General transfer comment: MinA to assist trunk elevation and maintain balance when standing from bed and recliner; heavy reliance on UE support to push into standing  Ambulation/Gait Ambulation/Gait assistance: Min assist Gait Distance (Feet): 12 Feet Assistive device: 4-wheeled walker Gait Pattern/deviations: Step-to pattern;Shuffle;Trunk flexed;Narrow base of support;Leaning posteriorly Gait velocity: Decreased Gait velocity interpretation: <1.31 ft/sec, indicative of household ambulator General Gait Details: Slow, shuffling gait with intermittent minA to maintain balance; required cues for safety, including closer proximity to RW, attempting to take bigger steps and sequencing with turns. MinA to prevent posterior LOB when turning/backing up to chair  Stairs            Wheelchair Mobility    Modified Rankin (Stroke Patients Only)       Balance Overall balance assessment: Needs assistance Sitting-balance support: No upper extremity supported Sitting balance-Leahy Scale: Fair       Standing balance-Leahy Scale: Poor Standing balance comment: Reliant on UE support or external assist with standing                              Pertinent Vitals/Pain Pain Assessment: Faces Faces Pain Scale: Hurts even more Pain Location: Abdomen Pain Descriptors / Indicators: Guarding;Grimacing;Discomfort Pain Intervention(s): Limited activity within patient's tolerance;Monitored during session    Home Living Family/patient expects to be discharged to:: Private residence Living Arrangements: Children Available Help at Discharge: Family;Available 24 hours/day Type of Home: House Home Access: Level entry     Home Layout: Two level;Bed/bath upstairs;1/2 bath on main level Home Equipment: Gilford Rile -  2 wheels;Cane - single point;Walker - 4  wheels;Wheelchair - manual;Bedside commode      Prior Function Level of Independence: Needs assistance   Gait / Transfers Assistance Needed: Currently working with [place]. Household ambulation with rollator, but pt reports she sits in w/c majority of day. Family assists with mobility as needed  ADL's / Homemaking Assistance Needed: Assist from family for bird baths at sink. Family does household tasks        Journalist, newspaper        Extremity/Trunk Assessment   Upper Extremity Assessment Upper Extremity Assessment: Generalized weakness    Lower Extremity Assessment Lower Extremity Assessment: Generalized weakness    Cervical / Trunk Assessment Cervical / Trunk Assessment: Kyphotic  Communication   Communication: No difficulties  Cognition Arousal/Alertness: Awake/alert Behavior During Therapy: Flat affect Overall Cognitive Status: No family/caregiver present to determine baseline cognitive functioning Area of Impairment: Attention;Following commands;Safety/judgement;Awareness;Problem solving                   Current Attention Level: Selective   Following Commands: Follows one step commands with increased time Safety/Judgement: Decreased awareness of deficits;Decreased awareness of safety Awareness: Emergent Problem Solving: Slow processing;Decreased initiation;Requires verbal cues;Difficulty sequencing General Comments: Frequent cues for safety and sequencing with mobility      General Comments      Exercises     Assessment/Plan    PT Assessment Patient needs continued PT services  PT Problem List Decreased strength;Decreased activity tolerance;Decreased balance;Decreased mobility;Decreased cognition;Decreased knowledge of use of DME;Decreased safety awareness;Decreased knowledge of precautions;Pain       PT Treatment Interventions DME instruction;Gait training;Functional mobility training;Therapeutic activities;Therapeutic exercise;Balance  training;Patient/family education;Wheelchair mobility training    PT Goals (Current goals can be found in the Care Plan section)  Acute Rehab PT Goals Patient Stated Goal: Return home with continued support from family  PT Goal Formulation: With patient Time For Goal Achievement: 10/19/18 Potential to Achieve Goals: Good    Frequency Min 3X/week   Barriers to discharge        Co-evaluation               AM-PAC PT "6 Clicks" Mobility  Outcome Measure Help needed turning from your back to your side while in a flat bed without using bedrails?: A Lot Help needed moving from lying on your back to sitting on the side of a flat bed without using bedrails?: A Lot Help needed moving to and from a bed to a chair (including a wheelchair)?: A Little Help needed standing up from a chair using your arms (e.g., wheelchair or bedside chair)?: A Little Help needed to walk in hospital room?: A Little Help needed climbing 3-5 steps with a railing? : A Lot 6 Click Score: 15    End of Session   Activity Tolerance: Patient limited by fatigue;Patient limited by pain Patient left: in chair;with call bell/phone within reach;with chair alarm set Nurse Communication: Mobility status PT Visit Diagnosis: Other abnormalities of gait and mobility (R26.89);Pain Pain - part of body: (Abdomen)    Time: 6767-2094 PT Time Calculation (min) (ACUTE ONLY): 31 min   Charges:   PT Evaluation $PT Eval Moderate Complexity: 1 Mod PT Treatments $Therapeutic Activity: 8-22 mins      Mabeline Caras, PT, DPT Acute Rehabilitation Services  Pager [phone] Office [place] 10/05/2018, 4:35 PM

## 2018-10-05 NOTE — Progress Notes (Addendum)
IR rounding note via telephone per new regulations. Spoke with [name], RN.  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 Anselm Pancoast, now with enlarging abscess s/p drain upsize 10/04/2018 by Dr. Anselm Pancoast.  Left pelvic drain site c/d/i with approximately 10 cc of clear brown fluid (since 0630 this AM) in JP drain. States drain flushes/aspirates without resistance.  Continue current drain management- continue with Qshift flushes/monitor of output. Appreciate and agree with [facility] management. IR to follow.  Bea Graff Yailine , PA-C 10/05/2018, 9:54 AM

## 2018-10-05 NOTE — Progress Notes (Signed)
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 Anselm Pancoast, now with enlarging abscess s/p drain upsize 10/04/2018 by Dr. Anselm Pancoast.  Patient for discharge today per [facility]. Patient to flush drain once daily with 10 cc NS flush- prescription eprescribed to pharmacy. 6N RN to show patient how to flush drain. Drain output monitoring cards given to patient. Plan to follow-up at drain clinic in 2 weeks for CT and drain injection (order placed for this).  Please call IR with questions/concerns.  Bea Graff Louk, PA-C 10/05/2018, 11:39 AM

## 2018-10-05 NOTE — Care Management (Signed)
Prior to admission was home with family and active with [facility] for PT.  Magdalen Spatz RN BSN [phone]

## 2018-10-06 ENCOUNTER — Telehealth: Payer: Self-pay | Admitting: *Deleted

## 2018-10-06 LAB — COMPREHENSIVE METABOLIC PANEL
ALT: 18 U/L (ref 0–44)
AST: 19 U/L (ref 15–41)
Albumin: 2 g/dL — ABNORMAL LOW (ref 3.5–5.0)
Alkaline Phosphatase: 56 U/L (ref 38–126)
Anion gap: 7 (ref 5–15)
BUN: 8 mg/dL (ref 8–23)
CO2: 21 mmol/L — ABNORMAL LOW (ref 22–32)
Calcium: 8.5 mg/dL — ABNORMAL LOW (ref 8.9–10.3)
Chloride: 108 mmol/L (ref 98–111)
Creatinine, Ser: 0.72 mg/dL (ref 0.44–1.00)
GFR calc Af Amer: >60 mL/min (ref >60)
GFR calc non Af Amer: >60 mL/min (ref >60)
Glucose, Bld: 112 mg/dL — ABNORMAL HIGH (ref 70–99)
Potassium: 3.7 mmol/L (ref 3.5–5.1)
Sodium: 136 mmol/L (ref 135–145)
Total Bilirubin: 0.3 mg/dL (ref 0.3–1.2)
Total Protein: 6.4 g/dL — ABNORMAL LOW (ref 6.5–8.1)

## 2018-10-06 LAB — CBC
HCT: 22.6 % — ABNORMAL LOW (ref 36.0–46.0)
Hemoglobin: 7.2 g/dL — ABNORMAL LOW (ref 12.0–15.0)
MCH: 21.2 pg — ABNORMAL LOW (ref 26.0–34.0)
MCHC: 31.9 g/dL (ref 30.0–36.0)
MCV: 66.7 fL — ABNORMAL LOW (ref 80.0–100.0)
Platelets: 350 10*3/uL (ref 150–400)
RBC: 3.39 MIL/uL — ABNORMAL LOW (ref 3.87–5.11)
RDW: 19.3 % — ABNORMAL HIGH (ref 11.5–15.5)
WBC: 8 10*3/uL (ref 4.0–10.5)
nRBC: 0 % (ref 0.0–0.2)

## 2018-10-06 NOTE — Progress Notes (Signed)
Patient ID: Jo Parker, female   DOB: 02-Feb-1946, 73 y.o.   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 Anselm Pancoast, now with enlarging abscess s/p drain upsize 10/04/2018 by Dr. Anselm Pancoast.  OP still brown thin liquid Foul odor 45 cc out yesterday Flushes easily per RN  Site is clean and dry NT No bleeding; no sign of infection  Will follow   OP orders in place for IR Clinic follow up

## 2018-10-06 NOTE — Telephone Encounter (Signed)
Called scheduled a WebEx appt

## 2018-10-06 NOTE — Progress Notes (Signed)
Patient discharged to home. Verbalizes understanding of all discharge instructions including drain care and flushing of 51mL daily. Family is familiar with flushing as they were flushing the drain prior to admission.  Spoke with patient's son [name] and niece Sharyn Lull, both state they understand discharge instructions, medications, and MD follow up appointments.

## 2018-10-06 NOTE — Progress Notes (Signed)
PT Cancellation Note  Patient Details Name: Jo Parker MRN: [id number] DOB: [birth year]   Cancelled Treatment:    Reason Eval/Treat Not Completed: Fatigue/lethargy limiting ability to participate. Pt declined despite max encouragement. Noted plans for d/c home today.  Mabeline Caras, PT, DPT Acute Rehabilitation Services  Pager [phone] Office [place] 10/06/2018, 11:50 AM

## 2018-10-06 NOTE — TOC Transition Note (Signed)
Transition of Care [facility]) - CM/SW Discharge Note   Patient Details  Name: Jo Parker MRN: [id number] Date of Birth: 1946/03/30  Transition of Care [facility]) CM/SW Contact:  Marilu Favre, RN Phone Number: 10/06/2018, 12:35 PM   Clinical Narrative:     Patient from home with family. Patient was flushing drain prior to admission and feels comfortable with drain care. Was active with [facility] prior to admission and wants to continue with them. Dan with [facility] aware.   Final next level of care: [place] Barriers to Discharge: No Barriers Identified   Patient Goals and CMS Choice Patient states their goals for this hospitalization and ongoing recovery are:: to go home  CMS Medicare.gov Compare Post Acute Care list provided to:: Patient Choice offered to / list presented to : Patient  Discharge Placement                       Discharge Plan and Services   Discharge Planning Services: CM Consult            DME Arranged: N/A         HH Arranged: RN, PT [place] Agency: [place] ([place]) Date [place]: 10/06/18 Time [place]: [id number] Representative spoke with at [place]: [place] ([place]) Interventions     Readmission Risk Interventions Readmission Risk Prevention Plan 01/31/2018  Transportation Screening Complete  Home Care Screening Complete  Medication Review (RN CM) Complete

## 2018-10-06 NOTE — Progress Notes (Signed)
Wasted 0.3mL Dilaudid with Massie Maroon, RN.

## 2018-10-06 NOTE — Discharge Summary (Signed)
Physician Discharge Summary  Jo Parker BDZ:[id number] DOB: June 06, 1946 DOA: 10/03/2018  PCP: No primary care provider on file.  Admit date: 10/03/2018 Discharge date: 10/06/2018  Admitted From: Inpatient Disposition: home  Recommendations for Outpatient Follow-up:  1. Follow up with PCP in 1-2 weeks 2. Please obtain BMP/CBC in one week 3. 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 Patient still has a drain in place son reports a drain was placed by interventional radiology at [facility]. It is draining yellow color liquid has been that way for many days. Patient was treated with 6 weeks of Invanz and fluconazole. Patient also has pelvic abscess with colonic fistula. Son reports that patient has lost significant amount of weight with decreased appetite generalized weakness unable to ambulate with no nausea or vomiting or diarrhea. Denies any chest pain shortness of breath or cough.  Patient was admitted to the hospital 08/05/2018 discharged 08/10/2018 discharged on fluconazole and ertapenemcompleted on4/08/2018  ED Course:received invanz x1. CT of the abdomen and pelvis showed significant enlargement of the left pelvic abscess. Percutaneous drain appears well located within this collection staying a persistent associated colonic fistula. No new intraperitoneal fluid collections or definite signs of metastatic disease. Will stenting of the distal thoracic and proximal abdominal aorta with exclusion of the previously  demonstrated saccular aneurysm.  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  This been a longstanding problem of approximately 6 months with a nonhealing abscess/fistula.  Surgery is following the patient as well as IR and infectious disease.  Patient is very poor surgical candidate.  IR replace drain and will follow-up with drain study in 2 weeks.  ID recommended against antibiotics cultures are negative patient's white count is normal she is afebrile and hemodynamically stable.  Patient be discharged home with close follow-up as outlined above.  Patient presented with acute kidney injury which was consistent with a prerenal azotemia.  BUN and creatinine have improved.  Patient will follow-up closely with her outpatient provider for continued renal surveillance.  Hypertension.  Patient blood pressures were moderately soft upon arrival which was consistent with a prerenal picture with decreased p.o. intake.  Blood pressure has improved she can titrate her blood pressure medications back in at home without acute change.  Failure to thrive in adult with concurrent severe protein calorie malnutrition.  Patient has had poor p.o. 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  We encourage continued p.o. intake and supplements with meals.  Patient has declined palliative care or hospice care despite very dismal prognosis.  Stage IV ovarian cancer with peritoneal carcinomatosis as noted patient is status post debulking with neoadjuvant chemotherapy she had a very complicated treatment course including sepsis abdominal pain concerning for perforation with exploratory surgery no port identified, respiratory failure.  Was seen by OB/GYN appreciate their  information.  Again discussed with the family goals of care with palliative and hospice declined by family and patient.  Mycotic aortic aneurysm with stent  placed at [facility] present on admission.  No current intervention necessary patient will follow-up with [facility] surgery as an outpatient.  Discharge Diagnoses:  Active Problems:   Primary peritoneal carcinomatosis ([name])   Severe protein-calorie malnutrition ([name])   Deficiency anemia   HLD (hyperlipidemia)   Essential hypertension   Pelvic abscess in female   Generalized weakness   Intra-abdominal abscess ([name])   Generalized abdominal pain    Discharge Instructions  Discharge Instructions    Call MD for:   Complete by:  As directed    For ant acute change in medical condition   Diet general   Complete by:  As directed    Discharge instructions   Complete by:  As directed    IR scheduling  f/up for CTand drain eval   Increase activity slowly   Complete by:  As directed      Allergies as of 10/06/2018      Reactions   Oxycodone Other (See Comments)   confusion      Medication List    TAKE these medications   acetaminophen 500 MG tablet Commonly known as:  TYLENOL Take 500 mg by mouth every 6 (six) hours as needed for mild pain or headache.   amoxicillin-clavulanate 875-125 MG tablet Commonly known as:  AUGMENTIN Take 1 tablet by mouth 2 (two) times daily.   aspirin 81 MG chewable tablet Chew 81 mg by mouth daily.   carvedilol 25 MG tablet Commonly known as:  COREG Take 25 mg by mouth 2 (two) times daily.   clopidogrel 75 MG tablet Commonly known as:  PLAVIX Take 75 mg by mouth daily.   feeding supplement Liqd Take 1 Container by mouth 2 (two) times daily between meals.   feeding supplement (ENSURE ENLIVE) Liqd Take 237 mLs by mouth 2 (two) times daily between meals.   fluconazole 200 MG tablet Commonly known as:  DIFLUCAN Take 1 tablet (200 mg total) by mouth daily.   magnesium oxide 400 MG  tablet Commonly known as:  MAG-OX Take 400 mg by mouth 2 (two) times daily.   megestrol 400 MG/10ML suspension Commonly known as:  MEGACE Take 10 mLs (400 mg total) by mouth daily.   mirtazapine 30 MG tablet Commonly known as:  REMERON Take 1 tablet (30 mg total) by mouth at bedtime. What changed:    how much to take  additional instructions   multivitamin animal shapes (with Ca/FA) with C & FA chewable tablet Chew 1 tablet by mouth daily.   sodium chloride 0.9 % injection Inject 10 mLs into the vein daily. Inject 10 mL into left pelvic drain once daily   traMADol 50 MG tablet Commonly known as:  ULTRAM Take 1 tablet (50 mg total) by mouth every 6 (six) hours as needed for moderate pain.       Allergies  Allergen Reactions  . Oxycodone Other (See Comments)    confusion    Consultations:  OB, interventional radiology, general surgery, ID was curb sided by the prior attending.   Procedures/Studies: Ct Abdomen Pelvis W Contrast  Result Date: 10/03/2018 CLINICAL DATA:  Increased generalized weakness. Recent surgery for ovarian cancer with subsequent percutaneous drainage of left pelvic abscess. EXAM: CT ABDOMEN AND PELVIS WITH CONTRAST TECHNIQUE: Multidetector CT imaging of the abdomen and pelvis was performed using the standard protocol following bolus administration of intravenous contrast. CONTRAST:  90mL OMNIPAQUE IOHEXOL 300 MG/ML  SOLN COMPARISON:  CT 07/21/2018. FINDINGS: Lower chest: Interval improved aeration of the  lung bases with resolution of the right pleural effusion. Interval stenting of the thoracoabdominal aorta, further described below. Coronary artery atherosclerosis noted. Hepatobiliary: The liver is normal in density without suspicious focal abnormality. No evidence of gallstones, gallbladder wall thickening or biliary dilatation. Pancreas: Unremarkable. No pancreatic ductal dilatation or surrounding inflammatory changes. Spleen: Normal in size without  focally suspicious finding. There are tiny low-density lesions posteriorly in the spleen on image 17/3 which are stable. Adrenals/Urinary Tract: Both adrenal glands appear normal. Stable small renal cysts, largest in the lower pole of the left kidney. No evidence of renal mass, urinary tract calculus or hydronephrosis. The bladder appears normal. Stomach/Bowel: Stable small periampullary duodenal diverticulum. The stomach and small bowel otherwise appear normal. There is a large amount of stool throughout the colon. There are loops of bowel which chronically protrude into postsurgical defects in the mid abdominal wall. These appear stable without evidence of incarceration. Sigmoid colon diverticulosis and wall thickening are similar to the previous study. Vascular/Lymphatic: There are no enlarged abdominal or pelvic lymph nodes. Interval stent grafting of the distal thoracic and proximal abdominal aorta. The superior extent of the stent is not imaged. It extends inferiorly to the renal arteries. There are fenestrations and stents in the proximal celiac trunk and superior mesenteric artery which appear patent. Contrast bolus is not optimal. The stent covers the previously demonstrated large saccular aneurysms which appear excluded from the aortic lumen. 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 No evidence of endograft leak. No new aneurysms are seen. The portal vein is patent. Reproductive: Hysterectomy.  No apparent adnexal mass. Other: Percutaneous left pelvic drain is in similar position, although was exchanged 5 days ago. Surrounding complex air-fluid collection with peripheral enhancement has enlarged, measuring 6.0 x 6.0 x 5.8 cm. The drain appears located in the posterior aspect of this collection. No separate or other enlarging pelvic fluid collections  identified. Musculoskeletal: No acute or significant osseous findings. IMPRESSION: 1. Interval significant enlargement of left pelvic abscess. The percutaneous drain appears well located within this collection, suggesting a persistent associated colonic fistula. 2. No new intraperitoneal fluid collections or definite signs of metastatic disease identified. 3. Interval stenting of the distal thoracic and proximal abdominal aorta with exclusion of the previously demonstrated saccular aneurysms. The right-sided aneurysm has not significantly in size, although no endograft leak identified. Attention on follow-up recommended. 4.  Aortic Atherosclerosis (ICD10-I70.0). Electronically Signed   By: Richardean Sale M.D.   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 Pelvic drain was recently replaced on 09/28/2018. Patient presents with increased weakness and follow-up CT demonstrated enlargement of the left pelvic abscess despite the percutaneous drain. Only a small amount of thick purulent fluid could be aspirated from the drain at the bedside. Plan for drain injection and possible exchange and up sizing. EXAM: ABSCESS DRAIN INJECTION WITH FLUOROSCOPY DRAIN EXCHANGE FLUOROSCOPY MEDICATIONS: None ANESTHESIA/SEDATION: None COMPLICATIONS: None immediate. PROCEDURE: Informed written consent was obtained from the patient after a thorough discussion of the procedural risks, benefits and alternatives. All questions were addressed. Maximal Sterile Barrier Technique was utilized including caps, mask, sterile gowns, sterile gloves, sterile drape, hand hygiene and skin antiseptic. A timeout was performed prior to the initiation of the procedure. The left lower quadrant drain  was prepped and draped in sterile fashion. Contrast was injected into the tube with some difficulty. The tube appeared to be partially occluded.  Subsequently, brown thick foul-smelling fluid was aspirated from the collection. The catheter was cut and removed over a Bentson wire. A new 12 French drain was advanced into the collection and approximately 75 mL of the thick brown fluid was removed. Follow-up drain injection was performed. Catheter was flushed with saline and attached to suction bulb. Skin was anesthetized with 1% lidocaine and the catheter was sutured to skin. FINDINGS: Old drain was well positioned within the abscess but not aspirating and flushing well. Contrast injection confirmed a persistent colonic fistula. After the drain was exchanged for a 12 French drain, 75 mL brown foul-smelling fluid was removed from the cavity. Drainage turned from the brown thick material to bloody material by the end of the procedure. IMPRESSION: 1. Residual left pelvic abscess collection with a colonic fistula. 2. Purulent stool-like material is draining from the pelvic abscess. The old drainage catheter was partially occluded, therefore, a new larger drain was placed. The patient currently has a 12 [place] drain. 3. Continue with routine flushing and will keep the drain to suction bulb in order to keep the pelvic abscess cavity decompressed. 4. Long-term plan for the drain is uncertain at this time. Electronically Signed   By: Markus Daft M.D.   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 Pelvic drain was recently replaced on 09/28/2018. Patient presents with increased weakness and follow-up CT demonstrated enlargement of the left pelvic abscess despite the percutaneous drain. Only a small amount of thick purulent fluid could be aspirated from the drain at the bedside. Plan for drain injection and possible exchange and up sizing. EXAM: ABSCESS DRAIN INJECTION WITH FLUOROSCOPY DRAIN EXCHANGE FLUOROSCOPY MEDICATIONS: None ANESTHESIA/SEDATION: None  COMPLICATIONS: None immediate. PROCEDURE: Informed written consent was obtained from the patient after a thorough discussion of the procedural risks, benefits and alternatives. All questions were addressed. Maximal Sterile Barrier Technique was utilized including caps, mask, sterile gowns, sterile gloves, sterile drape, hand hygiene and skin antiseptic. A timeout was performed prior to the initiation of the procedure. The left lower quadrant drain was prepped and draped in sterile fashion. Contrast was injected into the tube with some difficulty. The tube appeared to be partially occluded. Subsequently, brown thick foul-smelling fluid was aspirated from the collection. The catheter was cut and removed over a Bentson wire. A new 12 French drain was advanced into the collection and approximately 75 mL of the thick brown fluid was removed. Follow-up drain injection was performed. Catheter was flushed with saline and attached to suction bulb. Skin was anesthetized with 1% lidocaine and the catheter was sutured to skin. FINDINGS: Old drain was well positioned within the abscess but not aspirating and flushing well. Contrast injection confirmed a persistent colonic fistula. After the drain was exchanged for a 12 French drain, 75 mL brown foul-smelling fluid was removed from the cavity. Drainage turned from the brown thick material to bloody material by the end of the procedure. IMPRESSION: 1. Residual left pelvic abscess collection with a colonic fistula. 2. Purulent stool-like material is draining from the pelvic abscess. The old drainage catheter was partially occluded, therefore, a new larger drain was placed. The patient currently has a 12 [place] drain. 3. Continue with routine flushing and will keep the drain to suction bulb in order  to keep the pelvic abscess cavity decompressed. 4. Long-term plan for the drain is uncertain at this time. Electronically Signed   By: Markus Daft M.D.   On: 10/04/2018 13:39   Ir  Catheter Tube Change  Result Date: 09/28/2018 INDICATION: 73 year old with history of peritoneal adenocarcinoma with a chronic abscess collection along the left pelvic sidewall associated with a colonic fistula. Percutaneous drain in this area since 04/18/2018. Patient reports stool-like material leaking around the drain site. EXAM: DRAIN INJECTION WITH FLUOROSCOPY ABSCESS DRAIN EXCHANGE WITH FLUOROSCOPY MEDICATIONS: Patient is reportedly on oral antibiotics. ANESTHESIA/SEDATION: None COMPLICATIONS: None immediate. PROCEDURE: The drain dressings were removed. The radiopaque marker was outside of the body and one sidehole was outside of the body. Tip of the catheter was still underneath the skin. Informed written consent was obtained from the patient after a thorough discussion of the procedural risks, benefits and alternatives. All questions were addressed. Maximal Sterile Barrier Technique was utilized including caps, mask, sterile gowns, sterile gloves, sterile drape, hand hygiene and skin antiseptic. A timeout was performed prior to the initiation of the procedure. The catheter and surrounding skin was prepped and draped in sterile fashion. Contrast was injected through the tube. The catheter was cut and removed over a Bentson wire. Kumpe catheter was advanced into the pelvis. Additional contrast was injected. Catheter was removed over the wire and a new 10.2 [place] multipurpose drain was advanced over the wire. Approximately 30 mL of pink purulent fluid was removed from the pelvic abscess collection. Collection was irrigated with sterile saline. Follow-up drain injection was performed. Catheter was flushed and attached to gravity bag. Skin was anesthetized with 1% lidocaine and the catheter was secured to the skin with a suture and StatLock device. FINDINGS: Drainage catheter was partially dislodged with a side hole outside of the body. New drain was successfully advanced back into the pelvic collection and 30  mL of purulent fluid was removed. The abscess collection appear to be decompressed at the end of the procedure Drain injection confirmed a chronic colonic fistula. IMPRESSION: Successful exchange of the abscess drainage catheter. Approximately 30 mL of purulent fluid was removed from the chronic pelvic collection. Abscess cavity was decompressed at the end of the procedure. Persistent colonic fistula. Electronically Signed   By: Markus Daft M.D.   On: 09/28/2018 16:18   Dg Sinus/fist Tube Chk-non Gi  Result Date: 09/21/2018 INDICATION: Left pelvic abscess with persistent fistula to the adjacent sigmoid colon. EXAM: INJECTION OF PRE-EXISTING PERCUTANEOUS ABSCESS DRAIN UNDER FLUOROSCOPY MEDICATIONS: No additional medications. ANESTHESIA/SEDATION: None CONTRAST:  80 mL Omnipaque 300 FLUOROSCOPY TIME:  10 seconds.  3.2 mGy. COMPLICATIONS: None immediate. PROCEDURE: Informed written consent was obtained from the patient after a thorough discussion of the procedural risks, benefits and alternatives. All questions were addressed. Maximal Sterile Barrier Technique was utilized including caps, mask, sterile gowns, sterile gloves, sterile drape, hand hygiene and skin antiseptic. A timeout was performed prior to the initiation of the procedure. Contrast was injected via the pre-existing percutaneous drain. Fluoroscopic spot images were obtained. The drain was flushed and connected to a gravity drainage bag. FINDINGS: There remains a small triangular shaped decompressed abscess cavity in the left lateral lower pelvis. Superior fistula again noted directly communicating with the lumen of the sigmoid colon. Findings are similar to prior injection studies including the most recent dated 07/20/2018. 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 Electronically Signed   By: Eulas Post  Kathlene Cote M.D.   On: 09/21/2018 12:42       Subjective: Patient  reports is doing well pain controlled tolerating diet.  Excited about being discharged home today.  Discharge Exam: Vitals:   10/05/18 1957 10/06/18 0355  BP: (!) 147/64 (!) 143/55  Pulse: 96 89  Resp: 18 17  Temp: 99 F (37.2 C) 99.3 F (37.4 C)  SpO2: 100% 99%   Vitals:   10/05/18 0400 10/05/18 1459 10/05/18 1957 10/06/18 0355  BP: 134/72 (!) 145/66 (!) 147/64 (!) 143/55  Pulse: 83 97 96 89  Resp: 18  18 17   Temp: 99 F (37.2 C) 99.2 F (37.3 C) 99 F (37.2 C) 99.3 F (37.4 C)  TempSrc: Oral Oral Oral Oral  SpO2: 100% 100% 100% 99%  Weight:        General: Pt is alert, awake, not in acute distress poor cognition consistent with known dementia Cardiovascular: RRR, S1/S2 +, no rubs, no gallops Respiratory: CTA bilaterally, no wheezing, no rhonchi Abdominal: Soft, NT, ND, bowel sounds +, drain in place bulb scant serosanguineous fluid Extremities: no edema, no cyanosis    The results of significant diagnostics from this hospitalization (including imaging, microbiology, ancillary and laboratory) are listed below for reference.     Microbiology: Recent Results (from the past 240 hour(s))  Culture, blood (routine x 2)     Status: None (Preliminary result)   Collection Time: 10/03/18  2:00 PM  Result Value Ref Range Status   Specimen Description BLOOD RIGHT ANTECUBITAL  Final   Special Requests   Final    BOTTLES DRAWN AEROBIC AND ANAEROBIC Blood Culture adequate volume   Culture   Final    NO GROWTH 3 DAYS Performed at [facility], [address], [place], [place] [address]    Report Status PENDING  Incomplete  Culture, blood (routine x 2)     Status: None (Preliminary result)   Collection Time: 10/03/18  2:25 PM  Result Value Ref Range Status   Specimen Description BLOOD LEFT ARM  Final   Special Requests   Final    BOTTLES DRAWN AEROBIC AND ANAEROBIC Blood Culture results may not be optimal due to an inadequate volume of blood received in culture bottles    Culture   Final    NO GROWTH 3 DAYS Performed at [facility], [place] [address], [place], [place] [address]    Report Status PENDING  Incomplete     Labs: BNP (last 3 results) Recent Labs    06/30/18 1705  BNP 378.5*   Basic Metabolic Panel: Recent Labs  Lab 10/03/18 1310 10/04/18 0126 10/05/18 0133 10/06/18 0402  NA 136 138 137 136  K 4.1 3.8 3.6 3.7  CL 100 105 106 108  CO2 23 20* 20* 21*  GLUCOSE 111* 75 109* 112*  BUN 27* 18 12 8   CREATININE 1.21* 0.88 0.85 0.72  CALCIUM 9.6 9.2 8.8* 8.5*   Liver Function Tests: Recent Labs  Lab 10/03/18 1310 10/04/18 0126 10/05/18 0133 10/06/18 0402  AST 21 18 20 19   ALT 24 21 18 18   ALKPHOS 62 53 57 56  BILITOT 0.5 0.8 0.5 0.3  PROT 7.7 6.8 6.7 6.4*  ALBUMIN 2.4* 2.2* 2.1* 2.0*   No results for input(s): LIPASE, AMYLASE in the last 168 hours. No results for input(s): AMMONIA in the last 168 hours. CBC: Recent Labs  Lab 10/03/18 1310 10/04/18 0126 10/05/18 0133 10/06/18 0402  WBC 12.1* 11.2* 9.0 8.0  NEUTROABS 8.0*  --   --   --   HGB 8.1* 7.1* 7.2* 7.2*  HCT 26.1* 22.2* 22.3* 22.6*  MCV 68.7* 67.5* 67.2* 66.7*  PLT 399 334 337 350   Cardiac Enzymes: No results for input(s): CKTOTAL, CKMB, CKMBINDEX, TROPONINI in the last 168 hours. BNP: Invalid input(s): POCBNP CBG: No results for input(s): GLUCAP in the last 168 hours. D-Dimer No results for input(s): DDIMER in the last 72 hours. Hgb A1c No results for input(s): HGBA1C in the last 72 hours. Lipid Profile No results for input(s): CHOL, HDL, LDLCALC, TRIG, CHOLHDL, LDLDIRECT in the last 72 hours. Thyroid function studies No results for input(s): TSH, T4TOTAL, T3FREE, THYROIDAB in the last 72 hours.  Invalid input(s): FREET3 Anemia work up No results for input(s): VITAMINB12, FOLATE, FERRITIN, TIBC, IRON, RETICCTPCT in the last 72 hours. Urinalysis    Component Value Date/Time   COLORURINE YELLOW 10/03/2018 1649   APPEARANCEUR CLEAR  10/03/2018 1649   LABSPEC 1.040 (H) 10/03/2018 1649   PHURINE 7.0 10/03/2018 1649   GLUCOSEU NEGATIVE 10/03/2018 1649   HGBUR NEGATIVE 10/03/2018 1649   BILIRUBINUR NEGATIVE 10/03/2018 1649   KETONESUR NEGATIVE 10/03/2018 1649   PROTEINUR NEGATIVE 10/03/2018 1649   NITRITE NEGATIVE 10/03/2018 1649   LEUKOCYTESUR NEGATIVE 10/03/2018 1649   Sepsis Labs Invalid input(s): PROCALCITONIN,  WBC,  LACTICIDVEN Microbiology Recent Results (from the past 240 hour(s))  Culture, blood (routine x 2)     Status: None (Preliminary result)   Collection Time: 10/03/18  2:00 PM  Result Value Ref Range Status   Specimen Description BLOOD RIGHT ANTECUBITAL  Final   Special Requests   Final    BOTTLES DRAWN AEROBIC AND ANAEROBIC Blood Culture adequate volume   Culture   Final    NO GROWTH 3 DAYS Performed at [facility], [place] [address], [place], [place] [address]    Report Status PENDING  Incomplete  Culture, blood (routine x 2)     Status: None (Preliminary result)   Collection Time: 10/03/18  2:25 PM  Result Value Ref Range Status   Specimen Description BLOOD LEFT ARM  Final   Special Requests   Final    BOTTLES DRAWN AEROBIC AND ANAEROBIC Blood Culture results may not be optimal due to an inadequate volume of blood received in culture bottles   Culture   Final    NO GROWTH 3 DAYS Performed at [facility], [place] [address] [place], [place] [address]    Report Status PENDING  Incomplete     Time coordinating discharge: 35 minutes  SIGNED:   Nicolette Bang, MD  [facility] 10/06/2018, 10:09 AM Pager   If 7PM-7AM, please contact night-coverage [email or web address] Password [email or web address]

## 2018-10-07 ENCOUNTER — Other Ambulatory Visit: Payer: Self-pay | Admitting: Gynecologic Oncology

## 2018-10-07 ENCOUNTER — Telehealth: Payer: Self-pay | Admitting: Oncology

## 2018-10-07 DIAGNOSIS — R52 Pain, unspecified: Secondary | ICD-10-CM

## 2018-10-07 MED ORDER — TRAMADOL HCL 50 MG PO TABS: 50.0000 mg | ORAL_TABLET | Freq: Four times a day (QID) | ORAL | 0 refills | Status: DC | PRN

## 2018-10-07 NOTE — Progress Notes (Signed)
See navigator note.  Refill due to pain at drain site. Drain exchanged for larger tube.  Having discomfort at the site.

## 2018-10-07 NOTE — Telephone Encounter (Signed)
Called Jo Parker to see how Jo Parker is doing after discharge.  He said she is doing well except for pain/discomfort from the larger sized drainage tube.  He said she is almost out of Tramadol and needs a refill. Discussed again that she really needs a PCP and he is going to call [facility] again to schedule appointment.

## 2018-10-08 LAB — CULTURE, BLOOD (ROUTINE X 2)
Culture: NO GROWTH
Culture: NO GROWTH
Special Requests: ADEQUATE

## 2018-10-09 ENCOUNTER — Inpatient Hospital Stay (HOSPITAL_COMMUNITY)
Admission: EM | Admit: 2018-10-09 | Discharge: 2018-10-10 | DRG: 757 | Disposition: A | Discharge status: Other Acute Inpatient Hospital | Payer: Medicare PPO | Attending: Internal Medicine | Admitting: Internal Medicine

## 2018-10-09 ENCOUNTER — Other Ambulatory Visit: Payer: Self-pay

## 2018-10-09 ENCOUNTER — Emergency Department (HOSPITAL_COMMUNITY): Payer: Medicare PPO

## 2018-10-09 ENCOUNTER — Encounter (HOSPITAL_COMMUNITY): Payer: Self-pay | Admitting: Emergency Medicine

## 2018-10-09 DIAGNOSIS — R1032 Left lower quadrant pain: Secondary | ICD-10-CM

## 2018-10-09 DIAGNOSIS — C482 Malignant neoplasm of peritoneum, unspecified: Secondary | ICD-10-CM | POA: Diagnosis not present

## 2018-10-09 DIAGNOSIS — L0291 Cutaneous abscess, unspecified: Secondary | ICD-10-CM

## 2018-10-09 DIAGNOSIS — Z90711 Acquired absence of uterus with remaining cervical stump: Secondary | ICD-10-CM

## 2018-10-09 DIAGNOSIS — Z87891 Personal history of nicotine dependence: Secondary | ICD-10-CM | POA: Diagnosis not present

## 2018-10-09 DIAGNOSIS — Z1159 Encounter for screening for other viral diseases: Secondary | ICD-10-CM | POA: Diagnosis not present

## 2018-10-09 DIAGNOSIS — Z79899 Other long term (current) drug therapy: Secondary | ICD-10-CM | POA: Diagnosis not present

## 2018-10-09 DIAGNOSIS — I509 Heart failure, unspecified: Secondary | ICD-10-CM | POA: Diagnosis present

## 2018-10-09 DIAGNOSIS — E785 Hyperlipidemia, unspecified: Secondary | ICD-10-CM | POA: Diagnosis present

## 2018-10-09 DIAGNOSIS — Z9221 Personal history of antineoplastic chemotherapy: Secondary | ICD-10-CM | POA: Diagnosis not present

## 2018-10-09 DIAGNOSIS — Z7982 Long term (current) use of aspirin: Secondary | ICD-10-CM

## 2018-10-09 DIAGNOSIS — Z8543 Personal history of malignant neoplasm of ovary: Secondary | ICD-10-CM | POA: Diagnosis not present

## 2018-10-09 DIAGNOSIS — Z8542 Personal history of malignant neoplasm of other parts of uterus: Secondary | ICD-10-CM

## 2018-10-09 DIAGNOSIS — I719 Aortic aneurysm of unspecified site, without rupture: Secondary | ICD-10-CM | POA: Diagnosis present

## 2018-10-09 DIAGNOSIS — K5750 Diverticulosis of both small and large intestine without perforation or abscess without bleeding: Secondary | ICD-10-CM | POA: Diagnosis present

## 2018-10-09 DIAGNOSIS — Z8049 Family history of malignant neoplasm of other genital organs: Secondary | ICD-10-CM | POA: Diagnosis not present

## 2018-10-09 DIAGNOSIS — E43 Unspecified severe protein-calorie malnutrition: Secondary | ICD-10-CM | POA: Diagnosis present

## 2018-10-09 DIAGNOSIS — Z803 Family history of malignant neoplasm of breast: Secondary | ICD-10-CM

## 2018-10-09 DIAGNOSIS — N739 Female pelvic inflammatory disease, unspecified: Secondary | ICD-10-CM | POA: Diagnosis present

## 2018-10-09 DIAGNOSIS — D649 Anemia, unspecified: Secondary | ICD-10-CM | POA: Diagnosis present

## 2018-10-09 DIAGNOSIS — Z90722 Acquired absence of ovaries, bilateral: Secondary | ICD-10-CM

## 2018-10-09 DIAGNOSIS — I714 Abdominal aortic aneurysm, without rupture: Secondary | ICD-10-CM | POA: Diagnosis present

## 2018-10-09 DIAGNOSIS — C786 Secondary malignant neoplasm of retroperitoneum and peritoneum: Secondary | ICD-10-CM | POA: Diagnosis present

## 2018-10-09 DIAGNOSIS — Z7902 Long term (current) use of antithrombotics/antiplatelets: Secondary | ICD-10-CM | POA: Diagnosis not present

## 2018-10-09 DIAGNOSIS — Z801 Family history of malignant neoplasm of trachea, bronchus and lung: Secondary | ICD-10-CM | POA: Diagnosis not present

## 2018-10-09 DIAGNOSIS — I110 Hypertensive heart disease with heart failure: Secondary | ICD-10-CM | POA: Diagnosis present

## 2018-10-09 DIAGNOSIS — Z20828 Contact with and (suspected) exposure to other viral communicable diseases: Secondary | ICD-10-CM

## 2018-10-09 DIAGNOSIS — K632 Fistula of intestine: Secondary | ICD-10-CM | POA: Diagnosis present

## 2018-10-09 DIAGNOSIS — J449 Chronic obstructive pulmonary disease, unspecified: Secondary | ICD-10-CM | POA: Diagnosis present

## 2018-10-09 DIAGNOSIS — Z800 Family history of malignant neoplasm of digestive organs: Secondary | ICD-10-CM

## 2018-10-09 LAB — URINALYSIS, ROUTINE W REFLEX MICROSCOPIC
Bacteria, UA: NONE SEEN
Bilirubin Urine: NEGATIVE
Glucose, UA: NEGATIVE mg/dL
Hgb urine dipstick: NEGATIVE
Ketones, ur: NEGATIVE mg/dL
Leukocytes,Ua: NEGATIVE
Nitrite: NEGATIVE
Protein, ur: 30 mg/dL — AB
Specific Gravity, Urine: >1.046 — ABNORMAL HIGH (ref 1.005–1.030)
pH: 5 (ref 5.0–8.0)

## 2018-10-09 LAB — COMPREHENSIVE METABOLIC PANEL
ALT: 19 U/L (ref 0–44)
AST: 20 U/L (ref 15–41)
Albumin: 2.3 g/dL — ABNORMAL LOW (ref 3.5–5.0)
Alkaline Phosphatase: 63 U/L (ref 38–126)
Anion gap: 17 — ABNORMAL HIGH (ref 5–15)
BUN: 18 mg/dL (ref 8–23)
CO2: 17 mmol/L — ABNORMAL LOW (ref 22–32)
Calcium: 9 mg/dL (ref 8.9–10.3)
Chloride: 103 mmol/L (ref 98–111)
Creatinine, Ser: 1 mg/dL (ref 0.44–1.00)
GFR calc Af Amer: >60 mL/min (ref >60)
GFR calc non Af Amer: 56 mL/min — ABNORMAL LOW (ref >60)
Glucose, Bld: 70 mg/dL (ref 70–99)
Potassium: 3.8 mmol/L (ref 3.5–5.1)
Sodium: 137 mmol/L (ref 135–145)
Total Bilirubin: 0.7 mg/dL (ref 0.3–1.2)
Total Protein: 7.7 g/dL (ref 6.5–8.1)

## 2018-10-09 LAB — CBC WITH DIFFERENTIAL/PLATELET
Basophils Absolute: 0 10*3/uL (ref 0.0–0.1)
Basophils Relative: 1 %
Eosinophils Absolute: 0.1 10*3/uL (ref 0.0–0.5)
Eosinophils Relative: 1 %
HCT: 39.5 % (ref 36.0–46.0)
Hemoglobin: 12.3 g/dL (ref 12.0–15.0)
Lymphocytes Relative: 9 %
Lymphs Abs: 0.7 10*3/uL (ref 0.7–4.0)
MCH: 20.7 pg — ABNORMAL LOW (ref 26.0–34.0)
MCHC: 31.1 g/dL (ref 30.0–36.0)
MCV: 66.6 fL — ABNORMAL LOW (ref 80.0–100.0)
Monocytes Absolute: 0.4 10*3/uL (ref 0.1–1.0)
Monocytes Relative: 5 %
Neutro Abs: 6.5 10*3/uL (ref 1.7–7.7)
Neutrophils Relative %: 82 %
Platelets: 257 10*3/uL (ref 150–400)
RBC: 5.93 MIL/uL — ABNORMAL HIGH (ref 3.87–5.11)
RDW: 19.7 % — ABNORMAL HIGH (ref 11.5–15.5)
WBC: 7.9 10*3/uL (ref 4.0–10.5)
nRBC: 0 % (ref 0.0–0.2)

## 2018-10-09 LAB — LACTIC ACID, PLASMA
Lactic Acid, Venous: 1.7 mmol/L (ref 0.5–1.9)
Lactic Acid, Venous: 1.1 mmol/L (ref 0.5–1.9)

## 2018-10-09 LAB — SARS CORONAVIRUS 2 BY RT PCR (HOSPITAL ORDER, PERFORMED IN ~~LOC~~ HOSPITAL LAB): SARS Coronavirus 2: NEGATIVE

## 2018-10-09 MED ORDER — SODIUM CHLORIDE 0.9 % IV SOLN
1.0000 g | Freq: Two times a day (BID) | INTRAVENOUS | Status: DC
  Administered 2018-10-10 (×2): 1 g via INTRAVENOUS
  Filled 2018-10-09 (×5): qty 1

## 2018-10-09 MED ORDER — PIPERACILLIN-TAZOBACTAM 3.375 G IVPB: 3.3750 g | Freq: Three times a day (TID) | INTRAVENOUS | Status: DC

## 2018-10-09 MED ORDER — FENTANYL CITRATE (PF) 100 MCG/2ML IJ SOLN
50.0000 ug | Freq: Once | INTRAMUSCULAR | Status: AC
  Administered 2018-10-09: 50 ug via INTRAVENOUS
  Filled 2018-10-09: qty 2

## 2018-10-09 MED ORDER — HYDROMORPHONE HCL 1 MG/ML IJ SOLN
0.5000 mg | Freq: Once | INTRAMUSCULAR | Status: DC
  Filled 2018-10-09: qty 1

## 2018-10-09 MED ORDER — IOHEXOL 300 MG/ML  SOLN
100.0000 mL | Freq: Once | INTRAMUSCULAR | Status: AC | PRN
  Administered 2018-10-09: 20:00:00 100 mL via INTRAVENOUS

## 2018-10-09 MED ORDER — PIPERACILLIN-TAZOBACTAM 4.5 G IVPB: 4.5000 g | Freq: Once | INTRAVENOUS | Status: DC

## 2018-10-09 MED ORDER — ACETAMINOPHEN 500 MG PO TABS
1000.0000 mg | ORAL_TABLET | Freq: Once | ORAL | Status: AC
  Administered 2018-10-09: 18:00:00 1000 mg via ORAL
  Filled 2018-10-09: qty 2

## 2018-10-09 MED ORDER — FENTANYL CITRATE (PF) 100 MCG/2ML IJ SOLN
50.0000 ug | Freq: Once | INTRAMUSCULAR | Status: AC
  Administered 2018-10-09: 50 ug via INTRAVENOUS

## 2018-10-09 MED ORDER — SODIUM CHLORIDE 0.9 % IV BOLUS
1000.0000 mL | Freq: Once | INTRAVENOUS | Status: AC
  Administered 2018-10-09: 1000 mL via INTRAVENOUS

## 2018-10-09 MED ORDER — PIPERACILLIN-TAZOBACTAM 3.375 G IVPB 30 MIN
3.3750 g | Freq: Once | INTRAVENOUS | Status: AC
  Administered 2018-10-09: 21:00:00 3.375 g via INTRAVENOUS
  Filled 2018-10-09: qty 50

## 2018-10-09 NOTE — ED Provider Notes (Signed)
Angiocath insertion Performed by: Ephraim Hamburger  Consent: Verbal consent obtained. Risks and benefits: risks, benefits and alternatives were discussed Time out: Immediately prior to procedure a "time out" was called to verify the correct patient, procedure, equipment, support staff and site/side marked as required.  Preparation: Patient was prepped and draped in the usual sterile fashion.  Vein Location: right basilic  Ultrasound Guided  Gauge: 20  Normal blood return and flush without difficulty Patient tolerance: Patient tolerated the procedure well with no immediate complications.      Sherwood Gambler, MD 10/09/18 [phone]

## 2018-10-09 NOTE — ED Triage Notes (Signed)
Pt arrives with son who is her caretaker. Pt was discharged from hospital several days ago. She has ovarian cancer, pelvic cysts, has a drain to left lower abdomen/pelvic region. Son brought her back to ED due to pt having frequent fevers since discharge and states he is flushing the drain but nothing it coming out anymore. Pt is taking Augmentin and Fluconazole as directed.

## 2018-10-09 NOTE — H&P (Signed)
History and Physical   Leonilda Cozby RWE:[id number] DOB: 09-02-1945 DOA: 10/09/2018  Referring MD/NP/PA: Dr. Johnney Killian  PCP: Patient, [name] Per   Outpatient Specialists: Dr. Marcello Moores general surgery  Patient coming from: Home  Chief Complaint: Abdominal pain  HPI: Jo Parker is a 73 y.o. female with medical history significant of history of stage IV ovarian cancer, peritoneal carcinomatosis, status post neoadjuvant chemotherapy, debulking surgery in August, 2019, persistent colonic fistula, bacteremia, COPD, aortic abdominal aneurysm, status post CT-guided drainage catheter placement in the left pelvic sidewall April 18, 2018 with multiple revisions last twice in the last 10 days by IR who presents to the ER again today with abdominal pain low-grade temperature and continued symptoms.  Patient was discharged on April 30th 2020 on Augmentin which does not seem to have helped.  She is back in the ER.  CT scan showed worsening pelvic abscess.  General surgery consulted and deferred to interventional radiology to reassess and possibly reposition the drainage catheter.  She is therefore being admitted to the medical service for that.  Patient has low-grade temperature.  Abdominal pain is rated as dull ache rated as 5 out of 10 in the mid to lower abdomen bilaterally.  No radiation.  Is made worse with any movement.  Not relieved by anything.  ED Course: Temperature is 100.9, blood pressure 108/45, pulse 113 respiratory 26 oxygen sat 98% room air.  Her white count is 7.9 hemoglobin 12.3 and platelets 257.  Chemistry appears to be normal except for CO2 of 17.  Lactic acid 1.1.  CT abdomen pelvis shows large abscess collection in the left lateral pelvis with a pigtail drainage catheter and located more anteriorly within the collection then sent to prior studies.  Sigmoid diverticulosis and other findings.  Patient has been admitted for intervention.  Review of Systems: As per HPI otherwise 10 point  review of systems negative.    Past Medical History:  Diagnosis Date   Acute on chronic respiratory failure with hypoxia ([place]) 03/07/2018   Anxiety and depression    Aortic aneurysm ([place])    see ov note 03/2017    Arthritis    Aspiration pneumonia ([place])    Carcinomatosis ([facility]) 10/2017   CHF (congestive heart failure) ([name])    diastolic dysfunction   COPD (chronic obstructive pulmonary disease) ([place]) 10/23/2017   Depression    Family history of breast cancer    Family history of prostate cancer    Family history of uterine cancer    History of diverticulitis    HLD (hyperlipidemia) 10/23/2017   Hypertension    Mitral valve prolapse    Pelvic abscess in female    Sleep apnea    cpap machine mask and tubing in [place] per patient.  Patient does not have mask or tubing to bring on DOS of 01/27/2018.    Uterine cancer ([facility])    had partial hysterectomy at 27    Past Surgical History:  Procedure Laterality Date   ABDOMINAL HYSTERECTOMY     cardiac monitor implant     CEREBRAL ANEURYSM REPAIR     CERVICAL SPINE SURGERY     bone spurs   DEBULKING N/A 01/27/2018   Procedure: RADICAL DEBULKING;  Surgeon: [name], MD;  Location: [facility];  Service: Gynecology;  Laterality: N/A;   IR CATHETER TUBE CHANGE  06/13/2018   IR CATHETER TUBE CHANGE  09/28/2018   IR CATHETER TUBE CHANGE  10/04/2018   IR FLUORO GUIDE PORT INSERTION RIGHT  10/14/2017  IR PARACENTESIS  10/14/2017   IR RADIOLOGIST EVAL & MGMT  04/26/2018   IR RADIOLOGIST EVAL & MGMT  05/10/2018   IR RADIOLOGIST EVAL & MGMT  05/25/2018   IR RADIOLOGIST EVAL & MGMT  07/20/2018   IR REMOVAL TUN ACCESS W/ PORT W/O FL MOD SED  07/02/2018   IR SINUS/FIST TUBE CHK-NON GI  10/04/2018   IR US GUIDE VASC ACCESS RIGHT  10/14/2017   LAPAROTOMY N/A 01/27/2018   Procedure: EXPLORATORY LAPAROTOMY WITH REMOVAL OF BILATERAL TUBES AND OVARIES;  Surgeon: [name], MD;  Location: [facility];  Service: Gynecology;   Laterality: N/A;   LAPAROTOMY N/A 01/31/2018   Procedure: EXPLORATORY LAPAROTOMY, ABDOMINAL WASHOUT;  Surgeon: Isabel Caprice, MD;  Location: [facility];  Service: Gynecology;  Laterality: N/A;   OMENTECTOMY N/A 01/27/2018   Procedure: OMENTECTOMY;  Surgeon: [name], MD;  Location: [facility];  Service: Gynecology;  Laterality: N/A;   PARTIAL HYSTERECTOMY  1971   "precancer"   TEE WITHOUT CARDIOVERSION N/A 07/04/2018   Procedure: TRANSESOPHAGEAL ECHOCARDIOGRAM (TEE);  Surgeon: Sanda Klein, MD;  Location: [facility] ENDOSCOPY;  Service: Cardiovascular;  Laterality: N/A;     reports that she quit smoking about a year ago. She has a 55.00 pack-year smoking history. She has never used smokeless tobacco. She reports that she does not drink alcohol or use drugs.  Allergies  Allergen Reactions   Oxycodone Other (See Comments)    confusion    Family History  Problem Relation Age of Onset   Endometrial cancer Sister 75       endometrial ca   Endometrial cancer Paternal Aunt 15   Breast cancer Paternal Aunt    Stomach cancer Maternal Grandfather        d. 56s   Prostate cancer Paternal Uncle    Breast cancer Cousin        pat first cousin   Breast cancer Cousin        pat first cousin's daughter   Lung cancer Father        lung ca   Heart disease Mother    Stroke Paternal Grandmother    Endometrial cancer Cousin        paternal first cousin's daughter   Breast cancer Other        niece dx less than 80s   Breast cancer Other        niece, dx under 18     Prior to Admission medications   Medication Sig Start Date End Date Taking? Authorizing Provider  acetaminophen (TYLENOL) 500 MG tablet Take 500 mg by mouth every 6 (six) hours as needed for mild pain or headache.   Yes [provider]  amoxicillin-clavulanate (AUGMENTIN) 875-125 MG tablet Take 1 tablet by mouth 2 (two) times daily. 09/06/18  Yes Comer, Okey Regal, MD  aspirin 81 MG chewable tablet Chew 81 mg  by mouth daily. 09/01/18  Yes [provider]  carvedilol (COREG) 25 MG tablet Take 25 mg by mouth 2 (two) times daily. 09/19/18 10/19/18 Yes [provider]  clopidogrel (PLAVIX) 75 MG tablet Take 75 mg by mouth daily.   Yes [provider]  feeding supplement (BOOST HIGH PROTEIN) LIQD Take 1 Container by mouth 2 (two) times daily between meals.   Yes [provider]  fluconazole (DIFLUCAN) 200 MG tablet Take 1 tablet (200 mg total) by mouth daily. 09/06/18  Yes Comer, Okey Regal, MD  magnesium oxide (MAG-OX) 400 MG tablet Take 400 mg by mouth 2 (  two) times daily.    Yes [provider]  mirtazapine (REMERON) 30 MG tablet Take 1 tablet (30 mg total) by mouth at bedtime. Patient taking differently: Take 15 mg by mouth at bedtime.  05/13/18  Yes [name], MD  Pediatric Multiple Vit-C-FA (MULTIVITAMIN ANIMAL SHAPES, WITH CA/FA,) with C & FA chewable tablet Chew 1 tablet by mouth daily.   Yes [provider]  sodium chloride 0.9 % injection Inject 10 mLs into the vein daily. Inject 10 mL into left pelvic drain once daily 10/05/18  Yes Louk, Alexandra M, PA-C  traMADol (ULTRAM) 50 MG tablet Take 1 tablet (50 mg total) by mouth every 6 (six) hours as needed for moderate pain. 10/07/18  Yes Cross, Melissa D, NP  feeding supplement, ENSURE ENLIVE, (ENSURE ENLIVE) LIQD Take 237 mLs by mouth 2 (two) times daily between meals. Patient not taking: Reported on 10/03/2018 08/10/18   Jonetta Osgood, MD  megestrol (MEGACE) 400 MG/10ML suspension Take 10 mLs (400 mg total) by mouth daily. Patient not taking: Reported on 10/03/2018 08/10/18   Jonetta Osgood, MD    Physical Exam: Vitals:   10/09/18 2215 10/09/18 2230 10/09/18 2300 10/09/18 2313  BP: (!) 112/47 (!) 108/45 (!) 123/57 (!) 120/54  Pulse: (!) 105 (!) 106 (!) 106 (!) 103  Resp:  20  20  Temp:      TempSrc:      SpO2: 99% 100% 99% 100%      Constitutional: Chronically ill looking,  cachectic Vitals:   10/09/18 2215 10/09/18 2230 10/09/18 2300 10/09/18 2313  BP: (!) 112/47 (!) 108/45 (!) 123/57 (!) 120/54  Pulse: (!) 105 (!) 106 (!) 106 (!) 103  Resp:  20  20  Temp:      TempSrc:      SpO2: 99% 100% 99% 100%   Eyes: PERRL, lids and conjunctivae normal ENMT: Mucous membranes are moist. Posterior pharynx clear of any exudate or lesions.Normal dentition.  Neck: normal, supple, no masses, no thyromegaly Respiratory: clear to auscultation bilaterally, no wheezing, no crackles. Normal respiratory effort. No accessory muscle use.  Cardiovascular: Regular rate and rhythm, no murmurs / rubs / gallops. No extremity edema. 2+ pedal pulses. No carotid bruits.  Abdomen: Diffuse tenderness mid abdomen to suprapubic area, draining catheter in place with no obvious drainage no masses palpated. No hepatosplenomegaly. Bowel sounds positive.  Musculoskeletal: no clubbing / cyanosis. No joint deformity upper and lower extremities. Good ROM, no contractures. Normal muscle tone.  Skin: no rashes, lesions, ulcers. No induration Neurologic: CN 2-12 grossly intact. Sensation intact, DTR normal. Strength 5/5 in all 4.  Psychiatric: Normal judgment and insight. Alert and oriented x 3. Normal mood.     Labs on Admission: I have personally reviewed following labs and imaging studies  CBC: Recent Labs  Lab 10/03/18 1310 10/04/18 0126 10/05/18 0133 10/06/18 0402 10/09/18 1920  WBC 12.1* 11.2* 9.0 8.0 7.9  NEUTROABS 8.0*  --   --   --  6.5  HGB 8.1* 7.1* 7.2* 7.2* 12.3  HCT 26.1* 22.2* 22.3* 22.6* 39.5  MCV 68.7* 67.5* 67.2* 66.7* 66.6*  PLT 399 334 337 350 335   Basic Metabolic Panel: Recent Labs  Lab 10/03/18 1310 10/04/18 0126 10/05/18 0133 10/06/18 0402 10/09/18 1730  NA 136 138 137 136 137  K 4.1 3.8 3.6 3.7 3.8  CL 100 105 106 108 103  CO2 23 20* 20* 21* 17*  GLUCOSE 111* 75 109* 112* 70  BUN 27* 18  12 8 18   CREATININE 1.21* 0.88 0.85 0.72 1.00  CALCIUM 9.6 9.2 8.8*  8.5* 9.0   GFR: Estimated Creatinine Clearance: 37.2 mL/min (by C-G formula based on SCr of 1 mg/dL). Liver Function Tests: Recent Labs  Lab 10/03/18 1310 10/04/18 0126 10/05/18 0133 10/06/18 0402 10/09/18 1730  AST 21 18 20 19 20   ALT 24 21 18 18 19   ALKPHOS 62 53 57 56 63  BILITOT 0.5 0.8 0.5 0.3 0.7  PROT 7.7 6.8 6.7 6.4* 7.7  ALBUMIN 2.4* 2.2* 2.1* 2.0* 2.3*   No results for input(s): LIPASE, AMYLASE in the last 168 hours. No results for input(s): AMMONIA in the last 168 hours. Coagulation Profile: Recent Labs  Lab 10/04/18 0826  INR 1.5*   Cardiac Enzymes: No results for input(s): CKTOTAL, CKMB, CKMBINDEX, TROPONINI in the last 168 hours. BNP (last 3 results) No results for input(s): PROBNP in the last 8760 hours. HbA1C: No results for input(s): HGBA1C in the last 72 hours. CBG: No results for input(s): GLUCAP in the last 168 hours. Lipid Profile: No results for input(s): CHOL, HDL, LDLCALC, TRIG, CHOLHDL, LDLDIRECT in the last 72 hours. Thyroid Function Tests: No results for input(s): TSH, T4TOTAL, FREET4, T3FREE, THYROIDAB in the last 72 hours. Anemia Panel: No results for input(s): VITAMINB12, FOLATE, FERRITIN, TIBC, IRON, RETICCTPCT in the last 72 hours. Urine analysis:    Component Value Date/Time   COLORURINE YELLOW 10/09/2018 2151   APPEARANCEUR CLEAR 10/09/2018 2151   LABSPEC >1.046 (H) 10/09/2018 2151   PHURINE 5.0 10/09/2018 2151   GLUCOSEU NEGATIVE 10/09/2018 2151   HGBUR NEGATIVE 10/09/2018 2151   BILIRUBINUR NEGATIVE 10/09/2018 2151   [place] NEGATIVE 10/09/2018 2151   PROTEINUR 30 (A) 10/09/2018 2151   NITRITE NEGATIVE 10/09/2018 2151   LEUKOCYTESUR NEGATIVE 10/09/2018 2151   Sepsis Labs: @LABRCNTIP (procalcitonin:4,lacticidven:4) ) Recent Results (from the past 240 hour(s))  Culture, blood (routine x 2)     Status: None   Collection Time: 10/03/18  2:00 PM  Result Value Ref Range Status   Specimen Description BLOOD RIGHT ANTECUBITAL   Final   Special Requests   Final    BOTTLES DRAWN AEROBIC AND ANAEROBIC Blood Culture adequate volume   Culture   Final    NO GROWTH 5 DAYS Performed at [facility], [place] [address], [place], [place] [address]    Report Status 10/08/2018 FINAL  Final  Culture, blood (routine x 2)     Status: None   Collection Time: 10/03/18  2:25 PM  Result Value Ref Range Status   Specimen Description BLOOD LEFT ARM  Final   Special Requests   Final    BOTTLES DRAWN AEROBIC AND ANAEROBIC Blood Culture results may not be optimal due to an inadequate volume of blood received in culture bottles   Culture   Final    NO GROWTH 5 DAYS Performed at [facility], [place] [address], [place],  [address]    Report Status 10/08/2018 FINAL  Final  SARS Coronavirus 2 (CEPHEID - Performed in [place] hospital lab), Hosp Order     Status: None   Collection Time: 10/09/18  9:02 PM  Result Value Ref Range Status   SARS Coronavirus 2 NEGATIVE NEGATIVE Final    Comment: (NOTE) If result is NEGATIVE SARS-CoV-2 target nucleic acids are NOT DETECTED. The SARS-CoV-2 RNA is generally detectable in upper and lower  respiratory specimens during the acute phase of infection. The lowest  concentration of SARS-CoV-2 viral copies this  assay can detect is 250  copies / mL. A negative result does not preclude SARS-CoV-2 infection  and should not be used as the sole basis for treatment or other  patient management decisions.  A negative result may occur with  improper specimen collection / handling, submission of specimen other  than nasopharyngeal swab, presence of viral mutation(s) within the  areas targeted by this assay, and inadequate number of viral copies  (<250 copies / mL). A negative result must be combined with clinical  observations, patient history, and epidemiological information. If result is POSITIVE SARS-CoV-2 target nucleic acids are DETECTED. The SARS-CoV-2 RNA is generally detectable in  upper and lower  respiratory specimens dur ing the acute phase of infection.  Positive  results are indicative of active infection with SARS-CoV-2.  Clinical  correlation with patient history and other diagnostic information is  necessary to determine patient infection status.  Positive results do  not rule out bacterial infection or co-infection with other viruses. If result is PRESUMPTIVE POSTIVE SARS-CoV-2 nucleic acids MAY BE PRESENT.   A presumptive positive result was obtained on the submitted specimen  and confirmed on repeat testing.  While 2019 novel coronavirus  (SARS-CoV-2) nucleic acids may be present in the submitted sample  additional confirmatory testing may be necessary for epidemiological  and / or clinical management purposes  to differentiate between  SARS-CoV-2 and other Sarbecovirus currently known to infect humans.  If clinically indicated additional testing with an alternate test  methodology [phone]) is advised. The SARS-CoV-2 RNA is generally  detectable in upper and lower respiratory sp ecimens during the acute  phase of infection. The expected result is Negative. Fact Sheet for Patients:  StrictlyIdeas.no Fact Sheet for Healthcare Providers: BankingDealers.co.za This test is not yet approved or cleared by the [place] FDA and has been authorized for detection and/or diagnosis of SARS-CoV-2 by FDA under an Emergency Use Authorization (EUA).  This EUA will remain in effect (meaning this test can be used) for the duration of the COVID-19 declaration under Section 564(b)(1) of the Act, 21 U.S.C. section 360bbb-3(b)(1), unless the authorization is terminated or revoked sooner. Performed at [facility], [place] [address], [place], [place] [address]      Radiological Exams on Admission: Ct Abdomen Pelvis W Contrast  Result Date: 10/09/2018 CLINICAL DATA:  Ovarian cancer, pelvic cysts, LEFT lower  quadrant/pelvic drain, recently discharged from hospital but returns today due to fever, pain at drain area, lack of drain output EXAM: CT ABDOMEN AND PELVIS WITH CONTRAST TECHNIQUE: Multidetector CT imaging of the abdomen and pelvis was performed using the standard protocol following bolus administration of intravenous contrast. Sagittal and coronal MPR images reconstructed from axial data set. CONTRAST:  168mL OMNIPAQUE IOHEXOL 300 MG/ML SOLN IV. No oral contrast. COMPARISON:  10/03/2018 FINDINGS: Lower chest: Bibasilar atelectasis. Hepatobiliary: Elongated lateral segment LEFT lobe liver extending to LEFT lateral body wall. Gallbladder and liver otherwise normal appearance. Pancreas: Normal appearance. Incidentally noted duodenal diverticulum at pancreatic head. Spleen: Normal appearance Adrenals/Urinary Tract: Persistent peripelvic LEFT renal cyst. Adrenal glands, kidneys, ureters and bladder otherwise normal appearance Stomach/Bowel: Stomach decompressed. Increased stool in distal colon. Scattered sigmoid diverticula and chronic sigmoid wall thickening unchanged likely related to inflammation in pelvis. Remaining bowel loops unremarkable. Vascular/Lymphatic: Atherosclerotic calcifications aorta iliac arteries. 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 No contrast enhancement within this aneurysm. Prior stenting  of the celiac and superior mesenteric arteries. No adenopathy. Reproductive: Uterus surgically absent. Nonvisualization of ovaries. Other: Surgical drain in the LEFT pelvis. Persistent fluid collection in the LEFT lateral pelvis, 9.0 x 4.8 x 3.9 cm consistent with abscess. The pigtailed of the drainage catheter is more anteriorly positioned within the collection and previously seen. Increased perihepatic fluid in both the LEFT and RIGHT upper quadrants. Small amount of fluid at  LEFT pericolic gutter. Free fluid in central pelvis. Additional ill-defined areas of fluid attenuation in the pelvis could represent unopacified bowel loops or additional small collections. No free intraperitoneal air. Supraumbilical ventral hernia containing transverse colon without obstruction Musculoskeletal: No acute osseous findings. IMPRESSION: Persistent large abscess collection in the lateral LEFT pelvis with previously identified pigtail drainage catheter now located more anteriorly/less dependently within the collection than seen on prior study. Slightly increased free fluid at LEFT pericolic gutter and perihepatic versus previous study. Slightly more free fluid in pelvis than seen previously. 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 Sigmoid diverticulosis with chronic sigmoid wall thickening. Ventral hernia containing transverse colon without obstruction. Electronically Signed   By: Lavonia Dana M.D.   On: 10/09/2018 19:48      Assessment/Plan Principal Problem:   Pelvic abscess in female Active Problems:   Primary peritoneal carcinomatosis ([name])   Severe protein-calorie malnutrition ([place])   COPD (chronic obstructive pulmonary disease) ([name])   HLD (hyperlipidemia)   Status post bilateral salpingo-oophorectomy   Aorta aneurysm ([name])     #1 pelvic abscess in the female: Chronic persistent.  Patient is having low-grade temperature.  We will readmit the patient and start IV antibiotics.  I will do meropenem for now.  Consulted surgery as well as interventional radiology.  Patient's pigtail catheter will be repositioned.  Continue antibiotics.  #2 primary peritoneal carcinomatosis: Continue per oncology.  #3 COPD: No exacerbation.  #4 hyperlipidemia: Continue with statins.  #5 protein calorie malnutrition: Secondary to malignancy and chronic illness.  Nutritional counseling while in the hospital.  #6 abdominal aortic aneurysm: Stable at  baseline.   DVT prophylaxis: Heparin Code Status: Full code Family Communication: Discussed with patient Disposition Plan: To be determined Consults called: Dr. Markus Daft, interventional radiology Admission status: Inpatient  Severity of Illness: The appropriate patient status for this patient is INPATIENT. Inpatient status is judged to be reasonable and necessary in order to provide the required intensity of service to ensure the patient's safety. The patient's presenting symptoms, physical exam findings, and initial radiographic and laboratory data in the context of their chronic comorbidities is felt to place them at high risk for further clinical deterioration. Furthermore, it is not anticipated that the patient will be medically stable for discharge from the hospital within 2 midnights of admission. The following factors support the patient status of inpatient.   " The patient's presenting symptoms include abdominal pain. " The worrisome physical exam findings include diffuse tenderness in the abdomen. " The initial radiographic and laboratory data are worrisome because of CT showing increasing intra-abdominal abscess. " The chronic co-morbidities include ovarian cancer.   * I certify that at the point of admission it is my clinical judgment that the patient will require inpatient hospital care spanning beyond 2 midnights from the point of admission due to high intensity of service, high risk for further deterioration and high frequency of surveillance required.Barbette Merino MD [facility] Pager [phone]  If 7PM-7AM, please contact night-coverage [email or web address] Password [email or web address]  10/09/2018, 11:32 PM

## 2018-10-09 NOTE — ED Provider Notes (Signed)
[facility]ERGENCY DEPARTMENT Provider Note   CSN: [id number] Arrival date & time: 10/09/18  1627    History   Chief Complaint Chief Complaint  Patient presents with  . Fever  . Drain problem    HPI Jo Parker is a 73 y.o. female PMH of ovarian cancer, AAA s/p repair, abdominal wall abscess with percutaneous drain, COPD, CHF, and HTN presents to the ED with abdominal pain and weakness, drain issue and fever.  Son is at bedside who provides additional history.  He reports that she was in the emergency department on 10/03/2018.  He was admitted at that time for worsening pelvic abscess.  She had her drain replaced and was discharged on 10/06/2018.  Son reports that additionally the day after being released from the hospital, she was doing okay at home.  He then reports that patient started having some worsening abdominal pain as well as generalized weakness, loss of appetite.  He reports she had one episode of vomiting 2 days ago.  None since.  He states that over the last 2 days, patient has taken all bites of food and does not eat much.  He states that she is not drinking as much as she normally would.  Son reports that 2 days ago, she started having some low-grade fevers at about 99.5.  Son has been giving her Tylenol to help with fevers.  He reports that since last night, she has been complaining of some worsening abdominal pain.  Additionally, she has a JP drain inserted into her abdomen.  He states that he drained it on Thursday without any issues.  He states that since then, he has noticed some drainage around the actual site on the abdomen and states that it is leaking from there.  He states that patient has been compliant with her antibiotics.  Patient denies any chest pain, difficulty breathing.  Son denies any diarrhea.  Son denies any cough, urinary complaints.     The history is provided by the patient.    Past Medical History:  Diagnosis Date  . Acute on  chronic respiratory failure with hypoxia ([place]) 03/07/2018  . Anxiety and depression   . Aortic aneurysm ([place])    see ov note 03/2017   . Arthritis   . Aspiration pneumonia ([place])   . Carcinomatosis ([place]) 10/2017  . CHF (congestive heart failure) ([name])    diastolic dysfunction  . COPD (chronic obstructive pulmonary disease) ([place]) 10/23/2017  . Depression   . Family history of breast cancer   . Family history of prostate cancer   . Family history of uterine cancer   . History of diverticulitis   . HLD (hyperlipidemia) 10/23/2017  . Hypertension   . Mitral valve prolapse   . Pelvic abscess in female   . Sleep apnea    cpap machine mask and tubing in [place] per patient.  Patient does not have mask or tubing to bring on DOS of 01/27/2018.   Marland Kitchen Uterine cancer [facility])    had partial hysterectomy at 27    Patient Active Problem List   Diagnosis Date Noted  . Generalized abdominal pain   . Intra-abdominal abscess ([place]) 09/06/2018  . Palliative care encounter   . 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 Aorta aneurysm ([place]) 08/05/2018  . 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 Anemia 06/30/2018  . Generalized weakness 06/30/2018  . Preventive measure 04/05/2018  . Dehydration 03/30/2018  . Hypokalemia 03/30/2018  .  Tracheostomy care ([place])   . Wound dehiscence, surgical, initial encounter   . Pelvic abscess in female   . Status post bilateral salpingo-oophorectomy 02/09/2018  . Pressure injury of skin 02/05/2018  . Genetic testing 02/01/2018  . BRCA2 gene mutation positive 02/01/2018  . Free intraperitoneal air   . Low urine output   . Somnolence   . At risk for fluid volume overload   . Ovarian cancer ([place]) 01/27/2018  . History of uterine cancer 01/26/2018  . Family history of breast cancer   . Family history of uterine cancer   . Family history of prostate cancer   . 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 Anemia due to antineoplastic  chemotherapy 12/20/2017  . Essential hypertension 12/20/2017  . Hypomagnesemia   . Absolute anemia   . Malnutrition of moderate degree 10/26/2017  . COPD (chronic obstructive pulmonary disease) ([place]) 10/23/2017  . HLD (hyperlipidemia) 10/23/2017  . Goals of care, counseling/discussion 10/13/2017  . Deficiency anemia 10/11/2017  . Iron deficiency anemia 10/08/2017  . Severe protein-calorie malnutrition ([place]) 10/08/2017  . 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 ABDOMINAL HYSTERECTOMY    . cardiac monitor implant    . CEREBRAL ANEURYSM REPAIR    . CERVICAL SPINE SURGERY     bone spurs  . DEBULKING N/A 01/27/2018   Procedure: RADICAL DEBULKING;  Surgeon: [name], MD;  Location: [facility] ORS;  Service: Gynecology;  Laterality: N/A;  . IR CATHETER TUBE CHANGE  06/13/2018  . IR CATHETER TUBE CHANGE  09/28/2018  . IR CATHETER TUBE CHANGE  10/04/2018  . IR FLUORO GUIDE PORT INSERTION RIGHT  10/14/2017  . IR PARACENTESIS  10/14/2017  . IR RADIOLOGIST EVAL & MGMT  04/26/2018  . IR RADIOLOGIST EVAL & MGMT  05/10/2018  . IR RADIOLOGIST EVAL & MGMT  05/25/2018  . IR RADIOLOGIST EVAL & MGMT  07/20/2018  . IR REMOVAL TUN ACCESS W/ PORT W/O FL MOD SED  07/02/2018  . IR SINUS/FIST TUBE CHK-NON GI  10/04/2018  . IR US GUIDE VASC ACCESS RIGHT  10/14/2017  . LAPAROTOMY N/A 01/27/2018   Procedure: EXPLORATORY LAPAROTOMY WITH REMOVAL OF BILATERAL TUBES AND OVARIES;  Surgeon: [name], MD;  Location: [facility];  Service: Gynecology;  Laterality: N/A;  . LAPAROTOMY N/A 01/31/2018   Procedure: EXPLORATORY LAPAROTOMY, ABDOMINAL WASHOUT;  Surgeon: Isabel Caprice, MD;  Location: [facility];  Service: Gynecology;  Laterality: N/A;  . OMENTECTOMY N/A 01/27/2018   Procedure: OMENTECTOMY;  Surgeon: [name], MD;  Location: [facility];  Service: Gynecology;  Laterality: N/A;  . PARTIAL HYSTERECTOMY  1971   "precancer"  . TEE WITHOUT CARDIOVERSION N/A  07/04/2018   Procedure: TRANSESOPHAGEAL ECHOCARDIOGRAM (TEE);  Surgeon: Sanda Klein, MD;  Location: [facility] ENDOSCOPY;  Service: Cardiovascular;  Laterality: N/A;     OB History   No obstetric history on file.      Home Medications    Prior to Admission medications   Medication Sig Start Date End Date Taking? Authorizing Provider  acetaminophen (TYLENOL) 500 MG tablet Take 500 mg by mouth every 6 (six) hours as needed for mild pain or headache.   Yes [provider]  amoxicillin-clavulanate (AUGMENTIN) 875-125 MG tablet Take 1 tablet by mouth 2 (two) times daily. 09/06/18  Yes Comer, Okey Regal, MD  aspirin 81 MG chewable tablet Chew 81 mg by mouth daily. 09/01/18  Yes [provider]  carvedilol (COREG) 25 MG  tablet Take 25 mg by mouth 2 (two) times daily. 09/19/18 10/19/18 Yes [provider]  clopidogrel (PLAVIX) 75 MG tablet Take 75 mg by mouth daily.   Yes [provider]  feeding supplement (BOOST HIGH PROTEIN) LIQD Take 1 Container by mouth 2 (two) times daily between meals.   Yes [provider]  fluconazole (DIFLUCAN) 200 MG tablet Take 1 tablet (200 mg total) by mouth daily. 09/06/18  Yes Comer, Okey Regal, MD  magnesium oxide (MAG-OX) 400 MG tablet Take 400 mg by mouth 2 (two) times daily.    Yes [provider]  mirtazapine (REMERON) 30 MG tablet Take 1 tablet (30 mg total) by mouth at bedtime. Patient taking differently: Take 15 mg by mouth at bedtime.  05/13/18  Yes [name], MD  Pediatric Multiple Vit-C-FA (MULTIVITAMIN ANIMAL SHAPES, WITH CA/FA,) with C & FA chewable tablet Chew 1 tablet by mouth daily.   Yes [provider]  sodium chloride 0.9 % injection Inject 10 mLs into the vein daily. Inject 10 mL into left pelvic drain once daily 10/05/18  Yes Louk, Alexandra M, PA-C  traMADol (ULTRAM) 50 MG tablet Take 1 tablet (50 mg total) by mouth every 6 (six) hours as needed for moderate pain. 10/07/18  Yes Cross, Melissa D, NP   feeding supplement, ENSURE ENLIVE, (ENSURE ENLIVE) LIQD Take 237 mLs by mouth 2 (two) times daily between meals. Patient not taking: Reported on 10/03/2018 08/10/18   Jonetta Osgood, MD  megestrol (MEGACE) 400 MG/10ML suspension Take 10 mLs (400 mg total) by mouth daily. Patient not taking: Reported on 10/03/2018 08/10/18   Jonetta Osgood, MD    Family History Family History  Problem Relation Age of Onset  . Endometrial cancer Sister 13       endometrial ca  . Endometrial cancer Paternal Aunt 80  . Breast cancer Paternal Aunt   . Stomach cancer Maternal Grandfather        d. 74s  . Prostate cancer Paternal Uncle   . Breast cancer Cousin        pat first cousin  . Breast cancer Cousin        pat first cousin's daughter  . Lung cancer Father        lung ca  . Heart disease Mother   . Stroke Paternal Grandmother   . Endometrial cancer Cousin        paternal first cousin's daughter  . Breast cancer Other        niece dx less than 39s  . Breast cancer Other        niece, dx under 32    Social History Social History   Tobacco Use  . Smoking status: Former Smoker    Packs/day: 1.00    Years: 55.00    Pack years: 55.00    Last attempt to quit: 10/06/2017    Years since quitting: 1.0  . Smokeless tobacco: Never Used  Substance Use Topics  . Alcohol use: Never    Frequency: Never  . Drug use: Never     Allergies   Oxycodone   Review of Systems Review of Systems  Constitutional: Positive for activity change, appetite change and fever.  Respiratory: Negative for cough and shortness of breath.   Cardiovascular: Negative for chest pain.  Gastrointestinal: Positive for abdominal pain, nausea and vomiting. Negative for diarrhea.  Genitourinary: Negative for dysuria and hematuria.  Neurological: Positive for weakness (generalized). Negative for headaches.  All other systems reviewed  and are negative.    Physical Exam Updated Vital Signs BP (!) 120/50   Pulse (!)  102   Temp 97.7 F (36.5 C) (Oral)   Resp (!) 23   SpO2 100%   Physical Exam Vitals signs and nursing note reviewed.  Constitutional:      Appearance: Normal appearance. She is well-developed.     Comments: Frail and elderly appearing   HENT:     Head: Normocephalic and atraumatic.     Mouth/Throat:     Mouth: Mucous membranes are dry.  Eyes:     General: Lids are normal.     Conjunctiva/sclera: Conjunctivae normal.     Pupils: Pupils are equal, round, and reactive to light.  Neck:     Musculoskeletal: Full passive range of motion without pain.  Cardiovascular:     Rate and Rhythm: Normal rate and regular rhythm.     Pulses: Normal pulses.     Heart sounds: Normal heart sounds. No murmur. No friction rub. No gallop.   Pulmonary:     Effort: Pulmonary effort is normal.     Breath sounds: Normal breath sounds.     Comments: Lungs clear to auscultation bilaterally.  Symmetric chest rise.  No wheezing, rales, rhonchi. Abdominal:     Palpations: Abdomen is soft. Abdomen is not rigid.     Tenderness: There is generalized abdominal tenderness. There is guarding.     Comments: Generalized tenderness, most notably around the left side.  Drain in place in the left lower quadrant with some mild oozing from site.  JP drain in place.  Musculoskeletal: Normal range of motion.  Skin:    General: Skin is warm and dry.     Capillary Refill: Capillary refill takes less than 2 seconds.  Neurological:     Mental Status: She is alert and oriented to person, place, and time.  Psychiatric:        Speech: Speech normal.      ED Treatments / Results  Labs (all labs ordered are listed, but only abnormal results are displayed) Labs Reviewed  COMPREHENSIVE METABOLIC PANEL - Abnormal; Notable for the following components:      Result Value   CO2 17 (*)    Albumin 2.3 (*)    GFR calc non Af Amer 56 (*)    Anion gap 17 (*)    All other components within normal limits  URINALYSIS, ROUTINE W  REFLEX MICROSCOPIC - Abnormal; Notable for the following components:   Specific Gravity, Urine >1.046 (*)    Protein, ur 30 (*)    All other components within normal limits  CBC WITH DIFFERENTIAL/PLATELET - Abnormal; Notable for the following components:   RBC 5.93 (*)    MCV 66.6 (*)    MCH 20.7 (*)    RDW 19.7 (*)    All other components within normal limits  SARS CORONAVIRUS 2 (HOSPITAL ORDER, [place] [facility])  CULTURE, BLOOD (ROUTINE X 2)  CULTURE, BLOOD (ROUTINE X 2)  LACTIC ACID, PLASMA  LACTIC ACID, PLASMA  CBC WITH DIFFERENTIAL/PLATELET  PATHOLOGIST SMEAR REVIEW    EKG None  Radiology Ct Abdomen Pelvis W Contrast  Result Date: 10/09/2018 CLINICAL DATA:  Ovarian cancer, pelvic cysts, LEFT lower quadrant/pelvic drain, recently discharged from hospital but returns today due to fever, pain at drain area, lack of drain output EXAM: CT ABDOMEN AND PELVIS WITH CONTRAST TECHNIQUE: Multidetector CT imaging of the abdomen and pelvis was performed using the standard protocol  following bolus administration of intravenous contrast. Sagittal and coronal MPR images reconstructed from axial data set. CONTRAST:  172m OMNIPAQUE IOHEXOL 300 MG/ML SOLN IV. No oral contrast. COMPARISON:  10/03/2018 FINDINGS: Lower chest: Bibasilar atelectasis. Hepatobiliary: Elongated lateral segment LEFT lobe liver extending to LEFT lateral body wall. Gallbladder and liver otherwise normal appearance. Pancreas: Normal appearance. Incidentally noted duodenal diverticulum at pancreatic head. Spleen: Normal appearance Adrenals/Urinary Tract: Persistent peripelvic LEFT renal cyst. Adrenal glands, kidneys, ureters and bladder otherwise normal appearance Stomach/Bowel: Stomach decompressed. Increased stool in distal colon. Scattered sigmoid diverticula and chronic sigmoid wall thickening unchanged likely related to inflammation in pelvis. Remaining bowel loops unremarkable. Vascular/Lymphatic:  Atherosclerotic calcifications aorta iliac arteries. 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 No contrast enhancement within this aneurysm. Prior stenting of the celiac and superior mesenteric arteries. No adenopathy. Reproductive: Uterus surgically absent. Nonvisualization of ovaries. Other: Surgical drain in the LEFT pelvis. Persistent fluid collection in the LEFT lateral pelvis, 9.0 x 4.8 x 3.9 cm consistent with abscess. The pigtailed of the drainage catheter is more anteriorly positioned within the collection and previously seen. Increased perihepatic fluid in both the LEFT and RIGHT upper quadrants. Small amount of fluid at LEFT pericolic gutter. Free fluid in central pelvis. Additional ill-defined areas of fluid attenuation in the pelvis could represent unopacified bowel loops or additional small collections. No free intraperitoneal air. Supraumbilical ventral hernia containing transverse colon without obstruction Musculoskeletal: No acute osseous findings. IMPRESSION: Persistent large abscess collection in the lateral LEFT pelvis with previously identified pigtail drainage catheter now located more anteriorly/less dependently within the collection than seen on prior study. Slightly increased free fluid at LEFT pericolic gutter and perihepatic versus previous study. Slightly more free fluid in pelvis than seen previously. 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 Sigmoid diverticulosis with chronic sigmoid wall thickening. Ventral hernia containing transverse colon without obstruction. Electronically Signed   By: MLavonia DanaM.D.   On: 10/09/2018 19:48    Procedures Procedures (including critical care time)  Medications Ordered in ED Medications  meropenem (MERREM) 1 g in sodium chloride 0.9 % 100 mL IVPB (has no administration in  time range)  sodium chloride 0.9 % bolus 1,000 mL (0 mLs Intravenous Stopped 10/09/18 2114)  acetaminophen (TYLENOL) tablet 1,000 mg (1,000 mg Oral Given 10/09/18 1811)  iohexol (OMNIPAQUE) 300 MG/ML solution 100 mL (100 mLs Intravenous Contrast Given 10/09/18 1931)  fentaNYL (SUBLIMAZE) injection 50 mcg (50 mcg Intravenous Given 10/09/18 2012)  fentaNYL (SUBLIMAZE) injection 50 mcg (50 mcg Intravenous Given 10/09/18 2010)  piperacillin-tazobactam (ZOSYN) IVPB 3.375 g (0 g Intravenous Stopped 10/09/18 2151)     Initial Impression / Assessment and Plan / ED Course  I have reviewed the triage vital signs and the nursing notes.  Pertinent labs & imaging results that were available during my care of the patient were reviewed by me and considered in my medical decision making (see chart for details).        73year old female past medical history of ovarian cancer, AAA s/p repair, abdominal wall abscess with percutaneous drain, COPD, CHF, and HTN presents to the ED with abdominal pain and weakness, drain issue and fever.   CBC shows white blood cell count 7.9.  Hemoglobin is 12.3.  Lactic acid is normal.  CMP shows bicarb of 17 with an anion gap of 17.  TM pelvis shows persistent  large abscess collection in the lateral left pelvis with previously identified pigtail catheter.  It does appear more anteriorly/less dependent within the collection.  Additionally, there is slightly increased free fluid at the left paracolic gutter.  Discussed patient with Dr. Molli Posey (Surgery) he feels that the drain is not draining appropriately.  Recommends repositioning with IR.  Given fever, will plan to give dose of antibiotics.  Will plan for admission with IR to reposition drain.  If patient requires further evaluation and admission after IR reposition strain, recently can be transferred to [facility] for [facility] evaluation .   Discussed patient with Dr. Jonelle Sidle (hospitalist). Will admit.   Portions of this note were generated with  Lobbyist. Dictation errors may occur despite best attempts at proofreading.   Final Clinical Impressions(s) / ED Diagnoses   Final diagnoses:  Pelvic abscess in female  Left lower quadrant abdominal pain    ED Discharge Orders    None       Desma Mcgregor 10/10/18 Roma Schanz, MD 10/12/18 1154

## 2018-10-09 NOTE — Progress Notes (Signed)
Pharmacy Antibiotic Note  Jo Parker is a 74 y.o. female admitted on 10/09/2018 with intra-abdominal infection.  Pharmacy has been consulted for meropenem dosing. Initially started on zosyn, T 100.9, WBC wnl, LA 1.1, tachy.   SCr 1, CrCl ~ 37 ml/min  Plan: Meropenem 1g IV every 12 hours Monitor renal function, Cx and clinical progression to narrow     Temp (24hrs), Avg:99.8 F (37.7 C), Min:97.7 F (36.5 C), Max:100.9 F (38.3 C)  Recent Labs  Lab 10/03/18 1310 10/04/18 0126 10/05/18 0133 10/06/18 0402 10/09/18 1730 10/09/18 1919 10/09/18 1920 10/09/18 2253  WBC 12.1* 11.2* 9.0 8.0  --   --  7.9  --   CREATININE 1.21* 0.88 0.85 0.72 1.00  --   --   --   LATICACIDVEN 1.7  --   --   --   --  1.7  --  1.1    Estimated Creatinine Clearance: 37.2 mL/min (by C-G formula based on SCr of 1 mg/dL).    Allergies  Allergen Reactions  . Oxycodone Other (See Comments)    confusion    Antimicrobials this admission: Zosyn x1 Meropenem 5/3>>  Dose adjustments this admission: n/a  Microbiology results: 5/3 BCx: sent 5/3 COVID: sent  Bertis Ruddy, PharmD Clinical Pharmacist Please check AMION for all [place] numbers 10/09/2018 11:41 PM

## 2018-10-09 NOTE — ED Notes (Signed)
Patient transported to CT 

## 2018-10-09 NOTE — ED Notes (Signed)
ED TO INPATIENT HANDOFF REPORT  ED Nurse Name and Phone #: Dellie Catholic [phone]  S Name/Age/Gender Jo Parker 73 y.o. female Room/Bed: 034C/034C  Code Status   Code Status: Prior  Home/SNF/Other Home Patient oriented to: self, place, time and situation Is this baseline? Yes   Triage Complete: Triage complete  Chief Complaint Fever, Dehydration  Triage Note Pt arrives with son who is her caretaker. Pt was discharged from hospital several days ago. She has ovarian cancer, pelvic cysts, has a drain to left lower abdomen/pelvic region. Son brought her back to ED due to pt having frequent fevers since discharge and states he is flushing the drain but nothing it coming out anymore. Pt is taking Augmentin and Fluconazole as directed.    Allergies Allergies  Allergen Reactions  . Oxycodone Other (See Comments)    confusion    Level of Care/Admitting Diagnosis ED Disposition    ED Disposition Condition Comment   Admit  The patient appears reasonably stabilized for admission considering the current resources, flow, and capabilities available in the ED at this time, and I doubt any other [facility] requiring further screening and/or treatment in the ED prior to admission is  present.       B Medical/Surgery History Past Medical History:  Diagnosis Date  . Acute on chronic respiratory failure with hypoxia ([place]) 03/07/2018  . Anxiety and depression   . Aortic aneurysm ([place])    see ov note 03/2017   . Arthritis   . Aspiration pneumonia ([place])   . Carcinomatosis ([place]) 10/2017  . CHF (congestive heart failure) ([name])    diastolic dysfunction  . COPD (chronic obstructive pulmonary disease) ([place]) 10/23/2017  . Depression   . Family history of breast cancer   . Family history of prostate cancer   . Family history of uterine cancer   . History of diverticulitis   . HLD (hyperlipidemia) 10/23/2017  . Hypertension   . Mitral valve prolapse   . Pelvic abscess in female   . Sleep  apnea    cpap machine mask and tubing in [place] per patient.  Patient does not have mask or tubing to bring on DOS of 01/27/2018.   Marland Kitchen Uterine cancer ([place])    had partial hysterectomy at 27   Past Surgical History:  Procedure Laterality Date  . ABDOMINAL HYSTERECTOMY    . cardiac monitor implant    . CEREBRAL ANEURYSM REPAIR    . CERVICAL SPINE SURGERY     bone spurs  . DEBULKING N/A 01/27/2018   Procedure: RADICAL DEBULKING;  Surgeon: [name], MD;  Location: [facility];  Service: Gynecology;  Laterality: N/A;  . IR CATHETER TUBE CHANGE  06/13/2018  . IR CATHETER TUBE CHANGE  09/28/2018  . IR CATHETER TUBE CHANGE  10/04/2018  . IR FLUORO GUIDE PORT INSERTION RIGHT  10/14/2017  . IR PARACENTESIS  10/14/2017  . IR RADIOLOGIST EVAL & MGMT  04/26/2018  . IR RADIOLOGIST EVAL & MGMT  05/10/2018  . IR RADIOLOGIST EVAL & MGMT  05/25/2018  . IR RADIOLOGIST EVAL & MGMT  07/20/2018  . IR REMOVAL TUN ACCESS W/ PORT W/O FL MOD SED  07/02/2018  . IR SINUS/FIST TUBE CHK-NON GI  10/04/2018  . IR US GUIDE VASC ACCESS RIGHT  10/14/2017  . LAPAROTOMY N/A 01/27/2018   Procedure: EXPLORATORY LAPAROTOMY WITH REMOVAL OF BILATERAL TUBES AND OVARIES;  Surgeon: [name], MD;  Location: [facility];  Service: Gynecology;  Laterality: N/A;  . LAPAROTOMY N/A 01/31/2018  Procedure: EXPLORATORY LAPAROTOMY, ABDOMINAL WASHOUT;  Surgeon: Isabel Caprice, MD;  Location: [facility];  Service: Gynecology;  Laterality: N/A;  . OMENTECTOMY N/A 01/27/2018   Procedure: OMENTECTOMY;  Surgeon: [name], MD;  Location: [facility];  Service: Gynecology;  Laterality: N/A;  . PARTIAL HYSTERECTOMY  1971   "precancer"  . TEE WITHOUT CARDIOVERSION N/A 07/04/2018   Procedure: TRANSESOPHAGEAL ECHOCARDIOGRAM (TEE);  Surgeon: Sanda Klein, MD;  Location: [facility] ENDOSCOPY;  Service: Cardiovascular;  Laterality: N/A;     A IV Location/Drains/Wounds Patient Lines/Drains/Airways Status   Active Line/Drains/Airways    Name:   Placement date:    Placement time:   Site:   Days:   Peripheral IV 10/09/18 Antecubital   10/09/18    1911    Antecubital   less than 1   PICC Single Lumen 46/50/35 PICC Left Basilic 40 cm 0 cm   46/56/81    2751    Basilic   95   Closed System Drain 1 Left;Inferior LLQ Bulb (JP) 12 Fr.   10/04/18    1217    LLQ   5   External Urinary Catheter   07/23/18    0625    -   78   Incision (Closed) 07/05/18 Chest Right;Upper   07/05/18    1035     96   Incision (Closed) 07/05/18 Abdomen Mid   07/05/18    1036     96          Intake/Output Last 24 hours No intake or output data in the 24 hours ending 10/09/18 2309  Labs/Imaging Results for orders placed or performed during the hospital encounter of 10/09/18 (from the past 48 hour(s))  Comprehensive metabolic panel     Status: Abnormal   Collection Time: 10/09/18  5:30 PM  Result Value Ref Range   Sodium 137 135 - 145 mmol/L   Potassium 3.8 3.5 - 5.1 mmol/L   Chloride 103 98 - 111 mmol/L   CO2 17 (L) 22 - 32 mmol/L   Glucose, Bld 70 70 - 99 mg/dL   BUN 18 8 - 23 mg/dL   Creatinine, Ser 1.00 0.44 - 1.00 mg/dL   Calcium 9.0 8.9 - 10.3 mg/dL   Total Protein 7.7 6.5 - 8.1 g/dL   Albumin 2.3 (L) 3.5 - 5.0 g/dL   AST 20 15 - 41 U/L   ALT 19 0 - 44 U/L   Alkaline Phosphatase 63 38 - 126 U/L   Total Bilirubin 0.7 0.3 - 1.2 mg/dL   GFR calc non Af Amer 56 (L) >60 mL/min   GFR calc Af Amer >60 >60 mL/min   Anion gap 17 (H) 5 - 15    Comment: Performed at [facility], [address], [place], [place] [address]  Lactic acid, plasma     Status: None   Collection Time: 10/09/18  7:19 PM  Result Value Ref Range   Lactic Acid, Venous 1.7 0.5 - 1.9 mmol/L    Comment: Performed at [facility] [address] [place], [place] [address]  CBC with Differential/Platelet     Status: Abnormal   Collection Time: 10/09/18  7:20 PM  Result Value Ref Range   WBC 7.9 4.0 - 10.5 K/uL   RBC 5.93 (H) 3.87 - 5.11 MIL/uL   Hemoglobin 12.3 12.0 - 15.0 g/dL   HCT  39.5 36.0 - 46.0 %   MCV 66.6 (L) 80.0 - 100.0 fL   MCH 20.7 (L)  26.0 - 34.0 pg   MCHC 31.1 30.0 - 36.0 g/dL   RDW 19.7 (H) 11.5 - 15.5 %   Platelets 257 150 - 400 K/uL   nRBC 0.0 0.0 - 0.2 %   Neutrophils Relative % 82 %   Neutro Abs 6.5 1.7 - 7.7 K/uL   Lymphocytes Relative 9 %   Lymphs Abs 0.7 0.7 - 4.0 K/uL   Monocytes Relative 5 %   Monocytes Absolute 0.4 0.1 - 1.0 K/uL   Eosinophils Relative 1 %   Eosinophils Absolute 0.1 0.0 - 0.5 K/uL   Basophils Relative 1 %   Basophils Absolute 0.0 0.0 - 0.1 K/uL   WBC Morphology DOHLE BODIES     Comment: TOXIC GRANULATION VACUOLATED NEUTROPHILS MILD LEFT SHIFT (1-5% METAS, OCC MYELO, OCC BANDS)    RBC Morphology Schistocytes present     Comment: POLYCHROMASIA PRESENT TEARDROP CELLS Performed at [facility], [place] [address], [place], [place] [address]   SARS Coronavirus 2 (CEPHEID - Performed in [place] hospital lab), Hosp Order     Status: None   Collection Time: 10/09/18  9:02 PM  Result Value Ref Range   SARS Coronavirus 2 NEGATIVE NEGATIVE    Comment: (NOTE) If result is NEGATIVE SARS-CoV-2 target nucleic acids are NOT DETECTED. The SARS-CoV-2 RNA is generally detectable in upper and lower  respiratory specimens during the acute phase of infection. The lowest  concentration of SARS-CoV-2 viral copies this assay can detect is 250  copies / mL. A negative result does not preclude SARS-CoV-2 infection  and should not be used as the sole basis for treatment or other  patient management decisions.  A negative result may occur with  improper specimen collection / handling, submission of specimen other  than nasopharyngeal swab, presence of viral mutation(s) within the  areas targeted by this assay, and inadequate number of viral copies  (<250 copies / mL). A negative result must be combined with clinical  observations, patient history, and epidemiological information. If result is POSITIVE SARS-CoV-2 target nucleic  acids are DETECTED. The SARS-CoV-2 RNA is generally detectable in upper and lower  respiratory specimens dur ing the acute phase of infection.  Positive  results are indicative of active infection with SARS-CoV-2.  Clinical  correlation with patient history and other diagnostic information is  necessary to determine patient infection status.  Positive results do  not rule out bacterial infection or co-infection with other viruses. If result is PRESUMPTIVE POSTIVE SARS-CoV-2 nucleic acids MAY BE PRESENT.   A presumptive positive result was obtained on the submitted specimen  and confirmed on repeat testing.  While 2019 novel coronavirus  (SARS-CoV-2) nucleic acids may be present in the submitted sample  additional confirmatory testing may be necessary for epidemiological  and / or clinical management purposes  to differentiate between  SARS-CoV-2 and other Sarbecovirus currently known to infect humans.  If clinically indicated additional testing with an alternate test  methodology [phone]) is advised. The SARS-CoV-2 RNA is generally  detectable in upper and lower respiratory sp ecimens during the acute  phase of infection. The expected result is Negative. Fact Sheet for Patients:  StrictlyIdeas.no Fact Sheet for Healthcare Providers: BankingDealers.co.za This test is not yet approved or cleared by the [place] FDA and has been authorized for detection and/or diagnosis of SARS-CoV-2 by FDA under an Emergency Use Authorization (EUA).  This EUA will remain in effect (meaning this test can be used) for the duration of the COVID-19 declaration  under Section 564(b)(1) of the Act, 21 U.S.C. section 360bbb-3(b)(1), unless the authorization is terminated or revoked sooner. Performed at [facility], [place] [address] [place], [place] [address]   Urinalysis, Routine w reflex microscopic     Status: Abnormal   Collection Time: 10/09/18   9:51 PM  Result Value Ref Range   Color, Urine YELLOW YELLOW   APPearance CLEAR CLEAR   Specific Gravity, Urine >1.046 (H) 1.005 - 1.030   pH 5.0 5.0 - 8.0   Glucose, UA NEGATIVE NEGATIVE mg/dL   Hgb urine dipstick NEGATIVE NEGATIVE   Bilirubin Urine NEGATIVE NEGATIVE   Ketones, ur NEGATIVE NEGATIVE mg/dL   Protein, ur 30 (A) NEGATIVE mg/dL   Nitrite NEGATIVE NEGATIVE   Leukocytes,Ua NEGATIVE NEGATIVE   RBC / HPF 0-5 0 - 5 RBC/hpf   WBC, UA 0-5 0 - 5 WBC/hpf   Bacteria, UA NONE SEEN NONE SEEN   Mucus PRESENT    Hyaline Casts, UA PRESENT     Comment: Performed at [facility] [address] [place], [place] [address]   Ct Abdomen Pelvis W Contrast  Result Date: 10/09/2018 CLINICAL DATA:  Ovarian cancer, pelvic cysts, LEFT lower quadrant/pelvic drain, recently discharged from hospital but returns today due to fever, pain at drain area, lack of drain output EXAM: CT ABDOMEN AND PELVIS WITH CONTRAST TECHNIQUE: Multidetector CT imaging of the abdomen and pelvis was performed using the standard protocol following bolus administration of intravenous contrast. Sagittal and coronal MPR images reconstructed from axial data set. CONTRAST:  111mL OMNIPAQUE IOHEXOL 300 MG/ML SOLN IV. No oral contrast. COMPARISON:  10/03/2018 FINDINGS: Lower chest: Bibasilar atelectasis. Hepatobiliary: Elongated lateral segment LEFT lobe liver extending to LEFT lateral body wall. Gallbladder and liver otherwise normal appearance. Pancreas: Normal appearance. Incidentally noted duodenal diverticulum at pancreatic head. Spleen: Normal appearance Adrenals/Urinary Tract: Persistent peripelvic LEFT renal cyst. Adrenal glands, kidneys, ureters and bladder otherwise normal appearance Stomach/Bowel: Stomach decompressed. Increased stool in distal colon. Scattered sigmoid diverticula and chronic sigmoid wall thickening unchanged likely related to inflammation in pelvis. Remaining bowel loops unremarkable. Vascular/Lymphatic:  Atherosclerotic calcifications aorta iliac arteries. 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 No contrast enhancement within this aneurysm. Prior stenting of the celiac and superior mesenteric arteries. No adenopathy. Reproductive: Uterus surgically absent. Nonvisualization of ovaries. Other: Surgical drain in the LEFT pelvis. Persistent fluid collection in the LEFT lateral pelvis, 9.0 x 4.8 x 3.9 cm consistent with abscess. The pigtailed of the drainage catheter is more anteriorly positioned within the collection and previously seen. Increased perihepatic fluid in both the LEFT and RIGHT upper quadrants. Small amount of fluid at LEFT pericolic gutter. Free fluid in central pelvis. Additional ill-defined areas of fluid attenuation in the pelvis could represent unopacified bowel loops or additional small collections. No free intraperitoneal air. Supraumbilical ventral hernia containing transverse colon without obstruction Musculoskeletal: No acute osseous findings. IMPRESSION: Persistent large abscess collection in the lateral LEFT pelvis with previously identified pigtail drainage catheter now located more anteriorly/less dependently within the collection than seen on prior study. Slightly increased free fluid at LEFT pericolic gutter and perihepatic versus previous study. Slightly more free fluid in pelvis than seen previously. 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 Sigmoid diverticulosis with chronic sigmoid wall thickening. Ventral hernia containing transverse colon without obstruction. Electronically Signed   By: Lavonia Dana  M.D.   On: 10/09/2018 19:48    Pending Labs Unresulted Labs (From admission, onward)    Start     Ordered   10/09/18 1920  Pathologist smear review  Once,   R     10/09/18 1920   10/09/18 1657  CBC with  Differential  Once,   STAT     10/09/18 1657   10/09/18 1657  Culture, blood (routine x 2)  BLOOD CULTURE X 2,   STAT     10/09/18 1657   10/09/18 1657  Lactic acid, plasma  Now then every 2 hours,   STAT     10/09/18 1657          Vitals/Pain Today's Vitals   10/09/18 2200 10/09/18 2215 10/09/18 2230 10/09/18 2300  BP: (!) 113/50 (!) 112/47 (!) 108/45 (!) 123/57  Pulse: (!) 105 (!) 105 (!) 106 (!) 106  Resp: (!) 21  20   Temp:      TempSrc:      SpO2: 99% 99% 100% 99%  PainSc:        Isolation Precautions No active isolations  Medications Medications  piperacillin-tazobactam (ZOSYN) IVPB 3.375 g (has no administration in time range)  sodium chloride 0.9 % bolus 1,000 mL (0 mLs Intravenous Stopped 10/09/18 2114)  acetaminophen (TYLENOL) tablet 1,000 mg (1,000 mg Oral Given 10/09/18 1811)  iohexol (OMNIPAQUE) 300 MG/ML solution 100 mL (100 mLs Intravenous Contrast Given 10/09/18 1931)  fentaNYL (SUBLIMAZE) injection 50 mcg (50 mcg Intravenous Given 10/09/18 2012)  fentaNYL (SUBLIMAZE) injection 50 mcg (50 mcg Intravenous Given 10/09/18 2010)  piperacillin-tazobactam (ZOSYN) IVPB 3.375 g (0 g Intravenous Stopped 10/09/18 2151)    Mobility non-ambulatory High fall risk   Focused Assessments GI   R Recommendations: See Admitting Provider Note  Report given to:   Additional Notes:  Pt from home c/o problem with drainage at left lower quadrant originally places August 2019. No drainage in system now. Unable to assess site with dressing. Pt states pain 5 on scale 0-10.  Tenderness on palpitation. Last temp 97.7 F

## 2018-10-09 NOTE — Progress Notes (Signed)
Pharmacy Antibiotic Note  Jo Parker is a 73 y.o. female admitted on 10/09/2018 with intra-abdominal infection.  Pharmacy has been consulted for zosyn dosing. Pt with Tmax 100.9 and WBC is WNL. Scr is WNL.   Plan: Zosyn 3.375gm IV Q8H (4 hr inf) F/u renal fxn, C&S, clinical status    Temp (24hrs), Avg:100.4 F (38 C), Min:100.2 F (37.9 C), Max:100.9 F (38.3 C)  Recent Labs  Lab 10/03/18 1310 10/04/18 0126 10/05/18 0133 10/06/18 0402 10/09/18 1730 10/09/18 1919 10/09/18 1920  WBC 12.1* 11.2* 9.0 8.0  --   --  7.9  CREATININE 1.21* 0.88 0.85 0.72 1.00  --   --   LATICACIDVEN 1.7  --   --   --   --  1.7  --     Estimated Creatinine Clearance: 37.2 mL/min (by C-G formula based on SCr of 1 mg/dL).    Allergies  Allergen Reactions  . Oxycodone Other (See Comments)    confusion    Antimicrobials this admission: Zosyn 5/3>>  Dose adjustments this admission: N/A  Microbiology results: Pending  Thank you for allowing pharmacy to be a part of this patient's care.  Ercell Perlman, Rande Lawman 10/09/2018 8:26 PM

## 2018-10-09 NOTE — Progress Notes (Signed)
Patient ID: Jo Parker, female   DOB: 1946/01/04, 73 y.o.   MRN: [id number]   Please see Dr. Darrel Hoover note from 4/28.  The latest CT scan shows that the IR drain may not be adequately positioned to drain the large pelvic abscess.  Would ask IR to reevaluate for repositioning, upsizing of drain.  Agree with Dr. Darrel Hoover recommendations from 10/04/18.  Consider transferring to [place]. [name], MD, [facility]  General/ Trauma Surgery Beeper [phone]  10/09/2018 9:15 PM

## 2018-10-10 DIAGNOSIS — J449 Chronic obstructive pulmonary disease, unspecified: Secondary | ICD-10-CM

## 2018-10-10 DIAGNOSIS — I719 Aortic aneurysm of unspecified site, without rupture: Secondary | ICD-10-CM

## 2018-10-10 DIAGNOSIS — C482 Malignant neoplasm of peritoneum, unspecified: Secondary | ICD-10-CM

## 2018-10-10 DIAGNOSIS — E43 Unspecified severe protein-calorie malnutrition: Secondary | ICD-10-CM

## 2018-10-10 DIAGNOSIS — E785 Hyperlipidemia, unspecified: Secondary | ICD-10-CM

## 2018-10-10 LAB — CBC WITH DIFFERENTIAL/PLATELET
Abs Immature Granulocytes: 0.17 10*3/uL — ABNORMAL HIGH (ref 0.00–0.07)
Basophils Absolute: 0 10*3/uL (ref 0.0–0.1)
Basophils Relative: 0 %
Eosinophils Absolute: 0.2 10*3/uL (ref 0.0–0.5)
Eosinophils Relative: 2 %
HCT: 20.8 % — ABNORMAL LOW (ref 36.0–46.0)
Hemoglobin: 6.7 g/dL — CL (ref 12.0–15.0)
Immature Granulocytes: 2 %
Lymphocytes Relative: 12 %
Lymphs Abs: 1.3 10*3/uL (ref 0.7–4.0)
MCH: 21.3 pg — ABNORMAL LOW (ref 26.0–34.0)
MCHC: 32.2 g/dL (ref 30.0–36.0)
MCV: 66.2 fL — ABNORMAL LOW (ref 80.0–100.0)
Monocytes Absolute: 0.8 10*3/uL (ref 0.1–1.0)
Monocytes Relative: 7 %
Neutro Abs: 8.7 10*3/uL — ABNORMAL HIGH (ref 1.7–7.7)
Neutrophils Relative %: 77 %
Platelets: 366 10*3/uL (ref 150–400)
RBC: 3.14 MIL/uL — ABNORMAL LOW (ref 3.87–5.11)
RDW: 18.6 % — ABNORMAL HIGH (ref 11.5–15.5)
WBC: 11.2 10*3/uL — ABNORMAL HIGH (ref 4.0–10.5)
nRBC: 0 % (ref 0.0–0.2)

## 2018-10-10 LAB — PATHOLOGIST SMEAR REVIEW

## 2018-10-10 LAB — COMPREHENSIVE METABOLIC PANEL
ALT: 14 U/L (ref 0–44)
AST: 17 U/L (ref 15–41)
Albumin: 1.9 g/dL — ABNORMAL LOW (ref 3.5–5.0)
Alkaline Phosphatase: 50 U/L (ref 38–126)
Anion gap: 10 (ref 5–15)
BUN: 14 mg/dL (ref 8–23)
CO2: 19 mmol/L — ABNORMAL LOW (ref 22–32)
Calcium: 8.2 mg/dL — ABNORMAL LOW (ref 8.9–10.3)
Chloride: 110 mmol/L (ref 98–111)
Creatinine, Ser: 0.89 mg/dL (ref 0.44–1.00)
GFR calc Af Amer: >60 mL/min (ref >60)
GFR calc non Af Amer: >60 mL/min (ref >60)
Glucose, Bld: 76 mg/dL (ref 70–99)
Potassium: 3.1 mmol/L — ABNORMAL LOW (ref 3.5–5.1)
Sodium: 139 mmol/L (ref 135–145)
Total Bilirubin: 0.7 mg/dL (ref 0.3–1.2)
Total Protein: 6.5 g/dL (ref 6.5–8.1)

## 2018-10-10 LAB — HEMOGLOBIN AND HEMATOCRIT, BLOOD
HCT: 25.2 % — ABNORMAL LOW (ref 36.0–46.0)
Hemoglobin: 8.3 g/dL — ABNORMAL LOW (ref 12.0–15.0)

## 2018-10-10 LAB — ABO/RH: ABO/RH(D): B POS

## 2018-10-10 LAB — PREPARE RBC (CROSSMATCH)

## 2018-10-10 MED ORDER — SODIUM CHLORIDE 0.9 % IJ SOLN
10.0000 mL | Freq: Every day | INTRAMUSCULAR | Status: DC
  Filled 2018-10-10: qty 10

## 2018-10-10 MED ORDER — MAGNESIUM OXIDE 400 (241.3 MG) MG PO TABS
400.0000 mg | ORAL_TABLET | Freq: Two times a day (BID) | ORAL | Status: DC
  Administered 2018-10-10: 01:00:00 400 mg via ORAL
  Filled 2018-10-10 (×2): qty 1

## 2018-10-10 MED ORDER — MIRTAZAPINE 15 MG PO TABS
15.0000 mg | ORAL_TABLET | Freq: Every day | ORAL | Status: DC
  Administered 2018-10-10 (×2): 15 mg via ORAL
  Filled 2018-10-10 (×2): qty 1

## 2018-10-10 MED ORDER — FENTANYL CITRATE (PF) 100 MCG/2ML IJ SOLN
50.0000 ug | INTRAMUSCULAR | Status: DC | PRN
  Administered 2018-10-10 (×2): 50 ug via INTRAVENOUS
  Filled 2018-10-10 (×2): qty 2

## 2018-10-10 MED ORDER — SODIUM CHLORIDE 0.9 % IV SOLN
INTRAVENOUS | Status: DC
  Administered 2018-10-10: 01:00:00 via INTRAVENOUS

## 2018-10-10 MED ORDER — HEPARIN SODIUM (PORCINE) 5000 UNIT/ML IJ SOLN
5000.0000 [IU] | Freq: Three times a day (TID) | INTRAMUSCULAR | Status: DC
  Filled 2018-10-10: qty 1

## 2018-10-10 MED ORDER — ANIMAL SHAPES WITH C & FA PO CHEW
1.0000 | CHEWABLE_TABLET | Freq: Every day | ORAL | Status: DC
  Filled 2018-10-10: qty 1

## 2018-10-10 MED ORDER — SODIUM CHLORIDE 0.9% IV SOLUTION
Freq: Once | INTRAVENOUS | Status: AC
  Administered 2018-10-10: 13:00:00 via INTRAVENOUS

## 2018-10-10 MED ORDER — CLOPIDOGREL BISULFATE 75 MG PO TABS: 75.0000 mg | ORAL_TABLET | Freq: Every day | ORAL | Status: DC

## 2018-10-10 MED ORDER — SODIUM CHLORIDE 0.9% FLUSH
10.0000 mL | Freq: Every day | INTRAVENOUS | Status: DC
  Administered 2018-10-10: 10 mL

## 2018-10-10 MED ORDER — POTASSIUM CHLORIDE 10 MEQ/100ML IV SOLN
INTRAVENOUS | Status: AC
  Filled 2018-10-10: qty 100

## 2018-10-10 MED ORDER — TRAMADOL HCL 50 MG PO TABS: 50.0000 mg | ORAL_TABLET | Freq: Four times a day (QID) | ORAL | Status: DC | PRN

## 2018-10-10 MED ORDER — GERHARDT'S BUTT CREAM
TOPICAL_CREAM | Freq: Two times a day (BID) | CUTANEOUS | Status: DC
  Administered 2018-10-10: 11:00:00 via TOPICAL
  Administered 2018-10-10: 1 via TOPICAL
  Filled 2018-10-10: qty 1

## 2018-10-10 MED ORDER — ONDANSETRON HCL 4 MG PO TABS: 4.0000 mg | ORAL_TABLET | Freq: Four times a day (QID) | ORAL | Status: DC | PRN

## 2018-10-10 MED ORDER — FLUCONAZOLE 100 MG PO TABS: 200.0000 mg | ORAL_TABLET | Freq: Every day | ORAL | Status: DC

## 2018-10-10 MED ORDER — ASPIRIN 81 MG PO CHEW: 81.0000 mg | CHEWABLE_TABLET | Freq: Every day | ORAL | Status: DC

## 2018-10-10 MED ORDER — POTASSIUM CHLORIDE 10 MEQ/100ML IV SOLN
10.0000 meq | INTRAVENOUS | Status: AC
  Administered 2018-10-10 (×4): 10 meq via INTRAVENOUS
  Filled 2018-10-10 (×3): qty 100

## 2018-10-10 MED ORDER — ONDANSETRON HCL 4 MG/2ML IJ SOLN: 4.0000 mg | Freq: Four times a day (QID) | INTRAMUSCULAR | Status: DC | PRN

## 2018-10-10 MED ORDER — ACETAMINOPHEN 500 MG PO TABS
500.0000 mg | ORAL_TABLET | Freq: Four times a day (QID) | ORAL | Status: DC | PRN
  Administered 2018-10-10 (×2): 500 mg via ORAL
  Filled 2018-10-10 (×2): qty 1

## 2018-10-10 MED ORDER — BOOST HIGH PROTEIN PO LIQD
1.0000 | Freq: Two times a day (BID) | ORAL | Status: DC
  Filled 2018-10-10 (×2): qty 237

## 2018-10-10 MED ORDER — CARVEDILOL 25 MG PO TABS
25.0000 mg | ORAL_TABLET | Freq: Two times a day (BID) | ORAL | Status: DC
  Administered 2018-10-10 (×2): 25 mg via ORAL
  Filled 2018-10-10 (×2): qty 1

## 2018-10-10 NOTE — Consult Note (Signed)
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  Drain in place.  NO needs from [facility] team at this time related to fistula.  Stage 2 sacral pressure injury. PArtial thickenss. Will implement barrier paste twice daily.  Wound type:pressure and moisture Pressure Injury POA: Yes Measurement: 2 cm x 2 cmx 0.1 cm  Wound DOD:QVHQ and moist Drainage (amount, consistency, odor) scant serosanguinous  No odor Periwound:Left flank drain in place Dressing procedure/placement/frequency:Cleanse sacral wound with soap and water and pat dry.  Apply Gerhardts barrier paste twice daily and PRN soilage.  Will not follow at this time.  Please re-consult if needed.  Domenic Moras MSN, RN, FNP-BC CWON Wound, Ostomy, Continence Nurse Pager [phone]

## 2018-10-10 NOTE — Consult Note (Signed)
Chief Complaint: Patient was seen in consultation today for intra abdominal abscess drain evaluation/possible replacement Chief Complaint  Patient presents with   Fever   Drain problem   at the request of Dr Jake Samples  Supervising Physician: Daryll Brod  Patient Status: [facility] - In-pt  History of Present Illness: Jo Parker is a 73 y.o. 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 Residual left pelvic abscess collection with a colonic fistula. 2. Purulent stool-like material is draining from the pelvic abscess. The old drainage catheter was partially occluded, therefore, a new larger drain was placed. The patient currently has a 12 [place] drain. 3. Continue with routine flushing and will keep the drain to suction bulb in order to keep the pelvic abscess cavity decompressed. 4. Long-term plan for the drain is uncertain at this time.  Pt now returns to hospital ED with abd pain Low grade temp Admitted and has been seen by [facility]  CT 5/3:  IMPRESSION: Persistent large abscess collection in the lateral LEFT pelvis with previously identified pigtail drainage catheter now located more anteriorly/less dependently within the collection than seen on prior study. Slightly increased free fluid at LEFT pericolic gutter and perihepatic versus previous study. Slightly more free fluid in pelvis than seen previously.  Now request for drain evaluation/possible exchange/possible replacement   Past Medical History:  Diagnosis Date   Acute on chronic respiratory failure with hypoxia ([place]) 03/07/2018   Anxiety and depression    Aortic aneurysm ([place])    see ov note 03/2017    Arthritis    Aspiration  pneumonia ([place])    Carcinomatosis ([place]) 10/2017   CHF (congestive heart failure) ([name])    diastolic dysfunction   COPD (chronic obstructive pulmonary disease) ([place]) 10/23/2017   Depression    Family history of breast cancer    Family history of prostate cancer    Family history of uterine cancer    History of diverticulitis    HLD (hyperlipidemia) 10/23/2017   Hypertension    Mitral valve prolapse    Pelvic abscess in female    Sleep apnea    cpap machine mask and tubing in [place] per patient.  Patient does not have mask or tubing to bring on DOS of 01/27/2018.    Uterine cancer ([place])    had partial hysterectomy at 27    Past Surgical History:  Procedure Laterality Date   ABDOMINAL HYSTERECTOMY     cardiac monitor implant     CEREBRAL ANEURYSM REPAIR     CERVICAL SPINE SURGERY     bone spurs   DEBULKING N/A 01/27/2018   Procedure: RADICAL DEBULKING;  Surgeon: [name], MD;  Location: [facility];  Service: Gynecology;  Laterality: N/A;   IR CATHETER TUBE CHANGE  06/13/2018   IR CATHETER TUBE CHANGE  09/28/2018   IR CATHETER TUBE CHANGE  10/04/2018   IR FLUORO GUIDE PORT INSERTION RIGHT  10/14/2017   IR PARACENTESIS  10/14/2017   IR RADIOLOGIST EVAL & MGMT  04/26/2018   IR RADIOLOGIST EVAL & MGMT  05/10/2018   IR RADIOLOGIST EVAL & MGMT  05/25/2018   IR RADIOLOGIST EVAL & MGMT  07/20/2018   IR REMOVAL TUN ACCESS W/ PORT W/O FL MOD SED  07/02/2018   IR SINUS/FIST TUBE  CHK-NON GI  10/04/2018   IR US GUIDE VASC ACCESS RIGHT  10/14/2017   LAPAROTOMY N/A 01/27/2018   Procedure: EXPLORATORY LAPAROTOMY WITH REMOVAL OF BILATERAL TUBES AND OVARIES;  Surgeon: [name], MD;  Location: [facility];  Service: Gynecology;  Laterality: N/A;   LAPAROTOMY N/A 01/31/2018   Procedure: EXPLORATORY LAPAROTOMY, ABDOMINAL WASHOUT;  Surgeon: Isabel Caprice, MD;  Location: [facility];  Service: Gynecology;  Laterality: N/A;   OMENTECTOMY N/A 01/27/2018   Procedure: OMENTECTOMY;   Surgeon: [name], MD;  Location: [facility];  Service: Gynecology;  Laterality: N/A;   PARTIAL HYSTERECTOMY  1971   "precancer"   TEE WITHOUT CARDIOVERSION N/A 07/04/2018   Procedure: TRANSESOPHAGEAL ECHOCARDIOGRAM (TEE);  Surgeon: Sanda Klein, MD;  Location: [facility] ENDOSCOPY;  Service: Cardiovascular;  Laterality: N/A;    Allergies: Oxycodone  Medications: Prior to Admission medications   Medication Sig Start Date End Date Taking? Authorizing Provider  acetaminophen (TYLENOL) 500 MG tablet Take 500 mg by mouth every 6 (six) hours as needed for mild pain or headache.   Yes [provider]  amoxicillin-clavulanate (AUGMENTIN) 875-125 MG tablet Take 1 tablet by mouth 2 (two) times daily. 09/06/18  Yes Comer, Okey Regal, MD  aspirin 81 MG chewable tablet Chew 81 mg by mouth daily. 09/01/18  Yes [provider]  carvedilol (COREG) 25 MG tablet Take 25 mg by mouth 2 (two) times daily. 09/19/18 10/19/18 Yes [provider]  clopidogrel (PLAVIX) 75 MG tablet Take 75 mg by mouth daily.   Yes [provider]  feeding supplement (BOOST HIGH PROTEIN) LIQD Take 1 Container by mouth 2 (two) times daily between meals.   Yes [provider]  fluconazole (DIFLUCAN) 200 MG tablet Take 1 tablet (200 mg total) by mouth daily. 09/06/18  Yes Comer, Okey Regal, MD  magnesium oxide (MAG-OX) 400 MG tablet Take 400 mg by mouth 2 (two) times daily.    Yes [provider]  mirtazapine (REMERON) 30 MG tablet Take 1 tablet (30 mg total) by mouth at bedtime. Patient taking differently: Take 15 mg by mouth at bedtime.  05/13/18  Yes [name], MD  Pediatric Multiple Vit-C-FA (MULTIVITAMIN ANIMAL SHAPES, WITH CA/FA,) with C & FA chewable tablet Chew 1 tablet by mouth daily.   Yes [provider]  sodium chloride 0.9 % injection Inject 10 mLs into the vein daily. Inject 10 mL into left pelvic drain once daily 10/05/18  Yes Louk, Alexandra M, PA-C  traMADol (ULTRAM)  50 MG tablet Take 1 tablet (50 mg total) by mouth every 6 (six) hours as needed for moderate pain. 10/07/18  Yes Cross, Melissa D, NP  feeding supplement, ENSURE ENLIVE, (ENSURE ENLIVE) LIQD Take 237 mLs by mouth 2 (two) times daily between meals. Patient not taking: Reported on 10/03/2018 08/10/18   Jonetta Osgood, MD  megestrol (MEGACE) 400 MG/10ML suspension Take 10 mLs (400 mg total) by mouth daily. Patient not taking: Reported on 10/03/2018 08/10/18   Jonetta Osgood, MD     Family History  Problem Relation Age of Onset   Endometrial cancer Sister 53       endometrial ca   Endometrial cancer Paternal Aunt 75   Breast cancer Paternal Aunt    Stomach cancer Maternal Grandfather        d. 46s   Prostate cancer Paternal Uncle    Breast cancer Cousin        pat first cousin   Breast cancer Cousin  pat first cousin's daughter   Lung cancer Father        lung ca   Heart disease Mother    Stroke Paternal Grandmother    Endometrial cancer Cousin        paternal first cousin's daughter   Breast cancer Other        niece dx less than 55s   Breast cancer Other        niece, dx under 75    Social History   Socioeconomic History   Marital status: Married    Spouse name: Not on file   Number of children: 3   Years of education: Not on file   Highest education level: Not on file  Occupational History   Occupation: retired  Scientist, product/process development strain: Not on file   Food insecurity:    Worry: Not on file    Inability: Not on Lexicographer needs:    Medical: Not on file    Non-medical: Not on file  Tobacco Use   Smoking status: Former Smoker    Packs/day: 1.00    Years: 55.00    Pack years: 55.00    Last attempt to quit: 10/06/2017    Years since quitting: 1.0   Smokeless tobacco: Never Used  Substance and Sexual Activity   Alcohol use: Never    Frequency: Never   Drug use: Never   Sexual activity: Not Currently    Lifestyle   Physical activity:    Days per week: Not on file    Minutes per session: Not on file   Stress: Not on file  Relationships   Social connections:    Talks on phone: Not on file    Gets together: Not on file    Attends religious service: Not on file    Active member of club or organization: Not on file    Attends meetings of clubs or organizations: Not on file    Relationship status: Not on file  Other Topics Concern   Not on file  Social History Narrative   Not on file    Review of Systems: A 12 point ROS discussed and pertinent positives are indicated in the HPI above.  All other systems are negative.  Review of Systems  Constitutional: Positive for activity change, appetite change, fatigue and fever.  Gastrointestinal: Positive for abdominal pain, nausea and vomiting.  Neurological: Positive for weakness.  Psychiatric/Behavioral: Negative for behavioral problems and confusion.    Vital Signs: BP (!) 124/52    Pulse 99    Temp 98.4 F (36.9 C) (Oral)    Resp 15    SpO2 100%   Physical Exam Vitals signs reviewed.  Cardiovascular:     Rate and Rhythm: Normal rate and regular rhythm.     Heart sounds: Normal heart sounds.  Pulmonary:     Breath sounds: Normal breath sounds.  Abdominal:     General: Bowel sounds are normal.     Tenderness: There is abdominal tenderness.  Musculoskeletal: Normal range of motion.  Skin:    General: Skin is warm and dry.     Comments: Site is clean and dry I flushed and aspirated drain 2x with 10 cc sterile saline Aspirate is brown; feculent material  Neurological:     Mental Status: She is alert and oriented to person, place, and time.  Psychiatric:        Mood and Affect: Mood normal.  Behavior: Behavior normal.        Thought Content: Thought content normal.        Judgment: Judgment normal.     Imaging: Ct Abdomen Pelvis W Contrast  Result Date: 10/09/2018 CLINICAL DATA:  Ovarian cancer, pelvic cysts,  LEFT lower quadrant/pelvic drain, recently discharged from hospital but returns today due to fever, pain at drain area, lack of drain output EXAM: CT ABDOMEN AND PELVIS WITH CONTRAST TECHNIQUE: Multidetector CT imaging of the abdomen and pelvis was performed using the standard protocol following bolus administration of intravenous contrast. Sagittal and coronal MPR images reconstructed from axial data set. CONTRAST:  176mL OMNIPAQUE IOHEXOL 300 MG/ML SOLN IV. No oral contrast. COMPARISON:  10/03/2018 FINDINGS: Lower chest: Bibasilar atelectasis. Hepatobiliary: Elongated lateral segment LEFT lobe liver extending to LEFT lateral body wall. Gallbladder and liver otherwise normal appearance. Pancreas: Normal appearance. Incidentally noted duodenal diverticulum at pancreatic head. Spleen: Normal appearance Adrenals/Urinary Tract: Persistent peripelvic LEFT renal cyst. Adrenal glands, kidneys, ureters and bladder otherwise normal appearance Stomach/Bowel: Stomach decompressed. Increased stool in distal colon. Scattered sigmoid diverticula and chronic sigmoid wall thickening unchanged likely related to inflammation in pelvis. Remaining bowel loops unremarkable. Vascular/Lymphatic: Atherosclerotic calcifications aorta iliac arteries. 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 No contrast enhancement within this aneurysm. Prior stenting of the celiac and superior mesenteric arteries. No adenopathy. Reproductive: Uterus surgically absent. Nonvisualization of ovaries. Other: Surgical drain in the LEFT pelvis. Persistent fluid collection in the LEFT lateral pelvis, 9.0 x 4.8 x 3.9 cm consistent with abscess. The pigtailed of the drainage catheter is more anteriorly positioned within the collection and previously seen. Increased perihepatic fluid in both the LEFT and RIGHT upper quadrants. Small amount of  fluid at LEFT pericolic gutter. Free fluid in central pelvis. Additional ill-defined areas of fluid attenuation in the pelvis could represent unopacified bowel loops or additional small collections. No free intraperitoneal air. Supraumbilical ventral hernia containing transverse colon without obstruction Musculoskeletal: No acute osseous findings. IMPRESSION: Persistent large abscess collection in the lateral LEFT pelvis with previously identified pigtail drainage catheter now located more anteriorly/less dependently within the collection than seen on prior study. Slightly increased free fluid at LEFT pericolic gutter and perihepatic versus previous study. Slightly more free fluid in pelvis than seen previously. 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 Sigmoid diverticulosis with chronic sigmoid wall thickening. Ventral hernia containing transverse colon without obstruction. Electronically Signed   By: Lavonia Dana M.D.   On: 10/09/2018 19:48   Ct Abdomen Pelvis W Contrast  Result Date: 10/03/2018 CLINICAL DATA:  Increased generalized weakness. Recent surgery for ovarian cancer with subsequent percutaneous drainage of left pelvic abscess. EXAM: CT ABDOMEN AND PELVIS WITH CONTRAST TECHNIQUE: Multidetector CT imaging of the abdomen and pelvis was performed using the standard protocol following bolus administration of intravenous contrast. CONTRAST:  31mL OMNIPAQUE IOHEXOL 300 MG/ML  SOLN COMPARISON:  CT 07/21/2018. FINDINGS: Lower chest: Interval improved aeration of the lung bases with resolution of the right pleural effusion. Interval stenting of the thoracoabdominal aorta, further described below. Coronary artery atherosclerosis noted. Hepatobiliary: The liver is normal in density without suspicious focal abnormality. No evidence of gallstones, gallbladder wall thickening or biliary dilatation. Pancreas: Unremarkable. No pancreatic ductal dilatation or surrounding  inflammatory changes. Spleen: Normal in size without focally suspicious finding. There are tiny low-density lesions posteriorly in the spleen on image  17/3 which are stable. Adrenals/Urinary Tract: Both adrenal glands appear normal. Stable small renal cysts, largest in the lower pole of the left kidney. No evidence of renal mass, urinary tract calculus or hydronephrosis. The bladder appears normal. Stomach/Bowel: Stable small periampullary duodenal diverticulum. The stomach and small bowel otherwise appear normal. There is a large amount of stool throughout the colon. There are loops of bowel which chronically protrude into postsurgical defects in the mid abdominal wall. These appear stable without evidence of incarceration. Sigmoid colon diverticulosis and wall thickening are similar to the previous study. Vascular/Lymphatic: There are no enlarged abdominal or pelvic lymph nodes. Interval stent grafting of the distal thoracic and proximal abdominal aorta. The superior extent of the stent is not imaged. It extends inferiorly to the renal arteries. There are fenestrations and stents in the proximal celiac trunk and superior mesenteric artery which appear patent. Contrast bolus is not optimal. The stent covers the previously demonstrated large saccular aneurysms which appear excluded from the aortic lumen. 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 No evidence of endograft leak. No new aneurysms are seen. The portal vein is patent. Reproductive: Hysterectomy.  No apparent adnexal mass. Other: Percutaneous left pelvic drain is in similar position, although was exchanged 5 days ago. Surrounding complex air-fluid collection with peripheral enhancement has enlarged, measuring 6.0 x 6.0 x 5.8 cm. The drain appears located in the posterior aspect of this collection. No  separate or other enlarging pelvic fluid collections identified. Musculoskeletal: No acute or significant osseous findings. IMPRESSION: 1. Interval significant enlargement of left pelvic abscess. The percutaneous drain appears well located within this collection, suggesting a persistent associated colonic fistula. 2. No new intraperitoneal fluid collections or definite signs of metastatic disease identified. 3. Interval stenting of the distal thoracic and proximal abdominal aorta with exclusion of the previously demonstrated saccular aneurysms. The right-sided aneurysm has not significantly in size, although no endograft leak identified. Attention on follow-up recommended. 4.  Aortic Atherosclerosis (ICD10-I70.0). Electronically Signed   By: Richardean Sale M.D.   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 Pelvic drain was recently replaced on 09/28/2018. Patient presents with increased weakness and follow-up CT demonstrated enlargement of the left pelvic abscess despite the percutaneous drain. Only a small amount of thick purulent fluid could be aspirated from the drain at the bedside. Plan for drain injection and possible exchange and up sizing. EXAM: ABSCESS DRAIN INJECTION WITH FLUOROSCOPY DRAIN EXCHANGE FLUOROSCOPY MEDICATIONS: None ANESTHESIA/SEDATION: None COMPLICATIONS: None immediate. PROCEDURE: Informed written consent was obtained from the patient after a thorough discussion of the procedural risks, benefits and alternatives. All questions were addressed. Maximal Sterile Barrier Technique was utilized including caps, mask, sterile gowns, sterile gloves, sterile drape, hand hygiene and skin antiseptic. A timeout was performed prior to the initiation of the procedure. The left lower quadrant drain was prepped and draped in sterile fashion. Contrast was injected into the tube with some  difficulty. The tube appeared to be partially occluded. Subsequently, brown thick foul-smelling fluid was aspirated from the collection. The catheter was cut and removed over a Bentson wire. A new 12 French drain was advanced into the collection and approximately 75 mL of the thick brown fluid was removed. Follow-up drain injection was performed. Catheter was flushed  with saline and attached to suction bulb. Skin was anesthetized with 1% lidocaine and the catheter was sutured to skin. FINDINGS: Old drain was well positioned within the abscess but not aspirating and flushing well. Contrast injection confirmed a persistent colonic fistula. After the drain was exchanged for a 12 French drain, 75 mL brown foul-smelling fluid was removed from the cavity. Drainage turned from the brown thick material to bloody material by the end of the procedure. IMPRESSION: 1. Residual left pelvic abscess collection with a colonic fistula. 2. Purulent stool-like material is draining from the pelvic abscess. The old drainage catheter was partially occluded, therefore, a new larger drain was placed. The patient currently has a 12 [place] drain. 3. Continue with routine flushing and will keep the drain to suction bulb in order to keep the pelvic abscess cavity decompressed. 4. Long-term plan for the drain is uncertain at this time. Electronically Signed   By: Markus Daft M.D.   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 Pelvic drain was recently replaced on 09/28/2018. Patient presents with increased weakness and follow-up CT demonstrated enlargement of the left pelvic abscess despite the percutaneous drain. Only a small amount of thick purulent fluid could be aspirated from the drain at the bedside. Plan for drain injection and possible exchange and up sizing. EXAM: ABSCESS DRAIN INJECTION WITH FLUOROSCOPY DRAIN EXCHANGE  FLUOROSCOPY MEDICATIONS: None ANESTHESIA/SEDATION: None COMPLICATIONS: None immediate. PROCEDURE: Informed written consent was obtained from the patient after a thorough discussion of the procedural risks, benefits and alternatives. All questions were addressed. Maximal Sterile Barrier Technique was utilized including caps, mask, sterile gowns, sterile gloves, sterile drape, hand hygiene and skin antiseptic. A timeout was performed prior to the initiation of the procedure. The left lower quadrant drain was prepped and draped in sterile fashion. Contrast was injected into the tube with some difficulty. The tube appeared to be partially occluded. Subsequently, brown thick foul-smelling fluid was aspirated from the collection. The catheter was cut and removed over a Bentson wire. A new 12 French drain was advanced into the collection and approximately 75 mL of the thick brown fluid was removed. Follow-up drain injection was performed. Catheter was flushed with saline and attached to suction bulb. Skin was anesthetized with 1% lidocaine and the catheter was sutured to skin. FINDINGS: Old drain was well positioned within the abscess but not aspirating and flushing well. Contrast injection confirmed a persistent colonic fistula. After the drain was exchanged for a 12 French drain, 75 mL brown foul-smelling fluid was removed from the cavity. Drainage turned from the brown thick material to bloody material by the end of the procedure. IMPRESSION: 1. Residual left pelvic abscess collection with a colonic fistula. 2. Purulent stool-like material is draining from the pelvic abscess. The old drainage catheter was partially occluded, therefore, a new larger drain was placed. The patient currently has a 12 [place] drain. 3. Continue with routine flushing and will keep the drain to suction bulb in order to keep the pelvic abscess cavity decompressed. 4. Long-term plan for the drain is uncertain at this time. Electronically Signed    By: Markus Daft M.D.   On: 10/04/2018 13:39   Ir Catheter Tube Change  Result Date: 09/28/2018 INDICATION: 73 year old with history of peritoneal adenocarcinoma with a chronic abscess collection along the left pelvic sidewall associated with a colonic fistula. Percutaneous drain in this area since 04/18/2018. Patient  reports stool-like material leaking around the drain site. EXAM: DRAIN INJECTION WITH FLUOROSCOPY ABSCESS DRAIN EXCHANGE WITH FLUOROSCOPY MEDICATIONS: Patient is reportedly on oral antibiotics. ANESTHESIA/SEDATION: None COMPLICATIONS: None immediate. PROCEDURE: The drain dressings were removed. The radiopaque marker was outside of the body and one sidehole was outside of the body. Tip of the catheter was still underneath the skin. Informed written consent was obtained from the patient after a thorough discussion of the procedural risks, benefits and alternatives. All questions were addressed. Maximal Sterile Barrier Technique was utilized including caps, mask, sterile gowns, sterile gloves, sterile drape, hand hygiene and skin antiseptic. A timeout was performed prior to the initiation of the procedure. The catheter and surrounding skin was prepped and draped in sterile fashion. Contrast was injected through the tube. The catheter was cut and removed over a Bentson wire. [place] catheter was advanced into the pelvis. Additional contrast was injected. Catheter was removed over the wire and a new 10.2 [place] multipurpose drain was advanced over the wire. Approximately 30 mL of pink purulent fluid was removed from the pelvic abscess collection. Collection was irrigated with sterile saline. Follow-up drain injection was performed. Catheter was flushed and attached to gravity bag. Skin was anesthetized with 1% lidocaine and the catheter was secured to the skin with a suture and StatLock device. FINDINGS: Drainage catheter was partially dislodged with a side hole outside of the body. New drain was  successfully advanced back into the pelvic collection and 30 mL of purulent fluid was removed. The abscess collection appear to be decompressed at the end of the procedure Drain injection confirmed a chronic colonic fistula. IMPRESSION: Successful exchange of the abscess drainage catheter. Approximately 30 mL of purulent fluid was removed from the chronic pelvic collection. Abscess cavity was decompressed at the end of the procedure. Persistent colonic fistula. Electronically Signed   By: Markus Daft M.D.   On: 09/28/2018 16:18   Dg Sinus/fist Tube Chk-non Gi  Result Date: 09/21/2018 INDICATION: Left pelvic abscess with persistent fistula to the adjacent sigmoid colon. EXAM: INJECTION OF PRE-EXISTING PERCUTANEOUS ABSCESS DRAIN UNDER FLUOROSCOPY MEDICATIONS: No additional medications. ANESTHESIA/SEDATION: None CONTRAST:  80 mL Omnipaque 300 FLUOROSCOPY TIME:  10 seconds.  3.2 mGy. COMPLICATIONS: None immediate. PROCEDURE: Informed written consent was obtained from the patient after a thorough discussion of the procedural risks, benefits and alternatives. All questions were addressed. Maximal Sterile Barrier Technique was utilized including caps, mask, sterile gowns, sterile gloves, sterile drape, hand hygiene and skin antiseptic. A timeout was performed prior to the initiation of the procedure. Contrast was injected via the pre-existing percutaneous drain. Fluoroscopic spot images were obtained. The drain was flushed and connected to a gravity drainage bag. FINDINGS: There remains a small triangular shaped decompressed abscess cavity in the left lateral lower pelvis. Superior fistula again noted directly communicating with the lumen of the sigmoid colon. Findings are similar to prior injection studies including the most recent dated 07/20/2018. 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 Electronically Signed   By: Aletta Edouard M.D.    On: 09/21/2018 12:42    Labs:  CBC: Recent Labs    10/05/18 0133 10/06/18 0402 10/09/18 1920 10/10/18 0239  WBC 9.0 8.0 7.9 11.2*  HGB 7.2* 7.2* 12.3 6.7*  HCT 22.3* 22.6* 39.5 20.8*  PLT 337 350 257 366    COAGS: Recent Labs    01/31/18 0850  07/02/18 0500 07/21/18 1324 07/23/18 1109 10/04/18 0826  INR  1.36   < > 1.54 1.24 1.52 1.5*  APTT 36  --   --   --   --   --    < > = values in this interval not displayed.    BMP: Recent Labs    10/05/18 0133 10/06/18 0402 10/09/18 1730 10/10/18 0239  NA 137 136 137 139  K 3.6 3.7 3.8 3.1*  CL 106 108 103 110  CO2 20* 21* 17* 19*  GLUCOSE 109* 112* 70 76  BUN 12 8 18 14   CALCIUM 8.8* 8.5* 9.0 8.2*  CREATININE 0.85 0.72 1.00 0.89  GFRNONAA >60 >60 56* >60  GFRAA >60 >60 >60 >60    LIVER FUNCTION TESTS: Recent Labs    10/05/18 0133 10/06/18 0402 10/09/18 1730 10/10/18 0239  BILITOT 0.5 0.3 0.7 0.7  AST 20 19 20 17   ALT 18 18 19 14   ALKPHOS 57 56 63 50  PROT 6.7 6.4* 7.7 6.5  ALBUMIN 2.1* 2.0* 2.3* 1.9*    TUMOR MARKERS: No results for input(s): AFPTM, CEA, CA199, CHROMGRNA in the last 8760 hours.  Assessment and Plan:  Intra abdominal abscess drain Last revision 4/28--- upsized to 12 F drain Now with abd pain; fever Scheduled for drain evaluation and possible drain exchange/replacement Pt is aware of procedure benefits and risks including but not limited to Infection; bleeding; damage to surrounding structures Agreeable to proceed Consent signed and in IR  Thank you for this interesting consult.  I greatly enjoyed meeting Jo Parker and look forward to participating in their care.  A copy of this report was sent to the requesting provider on this date.  Electronically Signed: Lavonia Drafts, PA-C 10/10/2018, 9:25 AM   I spent a total of 40 Minutes    in face to face in clinical consultation, greater than 50% of which was counseling/coordinating care for drain evaluation

## 2018-10-10 NOTE — Social Work (Addendum)
CSW requested by bedside RN to fax Facesheet to [facility] for possible transfer.   Fax: [phone] Phone: [place], MSW, [facility] [phone]

## 2018-10-10 NOTE — Discharge Summary (Signed)
Physician Discharge Summary  Pamila Mendibles OEU:[id number] DOB: [birth year] DOA: 10/09/2018  PCP: Patient, [name] Per  Admit date: 10/09/2018 Discharge date: 10/10/2018  Admitted From: Inpatient Disposition: [facility]  Recommendations for Outpatient Follow-up:  1. Follow up per physician at receiving facility   Home Health:No Equipment/Devices: None Discharge Condition:Stable CODE STATUS:Full code Diet recommendation: Regular healthy diet  Brief/Interim Summary: Per admitting physician: Chasiti Waddington is a 73 y.o. female with medical history significant of history of stage IV ovarian cancer, peritoneal carcinomatosis, status post neoadjuvant chemotherapy, debulking surgery in August, 2019, persistent colonic fistula, bacteremia, COPD, aortic abdominal aneurysm, status post CT-guided drainage catheter placement in the left pelvic sidewall April 18, 2018 with multiple revisions last twice in the last 10 days by IR who presents to the ER again today with abdominal pain low-grade temperature and continued symptoms.  Patient was discharged on April 30th 2020 on Augmentin which does not seem to have helped.  She is back in the ER.  CT scan showed worsening pelvic abscess.  General surgery consulted and deferred to interventional radiology to reassess and possibly reposition the drainage catheter.  She is therefore being admitted to the medical service for that.  Patient has low-grade temperature.  Abdominal pain is rated as dull ache rated as 5 out of 10 in the mid to lower abdomen bilaterally.  No radiation.  Is made worse with any movement.  Not relieved by anything.  ED Course: Temperature is 100.9, blood pressure 108/45, pulse 113 respiratory 26 oxygen sat 98% room air.  Her white count is 7.9 hemoglobin 12.3 and platelets 257.  Chemistry appears to be normal except for CO2 of 17.  Lactic acid 1.1.  CT abdomen pelvis shows large abscess collection in the left lateral pelvis  with a pigtail drainage catheter and located more anteriorly within the collection then sent to prior studies.  Sigmoid diverticulosis and other findings.  Patient has been admitted for intervention.  Hospital course: The complex history with stage IV ovarian cancer with peritoneal carcinomatosis with a nonhealing fistula with drain in place for over 6 months.  Patient represented to the ER after recent discharge for same.  Pain is worsening with minimal drainage.  Patient was reportedly been seen by general surgery with no plans for surgical intervention.  IR was going to see the patient in consultation for adjustment of drain.  Patient's primary gynecologist was contacted in [facility] and is excepted the patient in transfer for further eval.  Patient's chronic medical problems include COPD, hyperlipidemia protein calorie malnutrition and known abdominal aortic aneurysm were evaluated and treated with home medications without acute adjustment.  Patient made in the hospital after overnight admission with plan for discharge and transfer to [facility] today.  Discharge Diagnoses:  Principal Problem:   Pelvic abscess in female Active Problems:   Primary peritoneal carcinomatosis ([place])   Severe protein-calorie malnutrition ([place])   COPD (chronic obstructive pulmonary disease) ([name])   HLD (hyperlipidemia)   Status post bilateral salpingo-oophorectomy   Aorta aneurysm [facility])    Discharge Instructions  Discharge Instructions    Call MD for:   Complete by:  As directed    Per receiving facility   Diet - low sodium heart healthy   Complete by:  As directed    Increase activity slowly   Complete by:  As directed      Allergies as of 10/10/2018      Reactions   Oxycodone Other (  See Comments)   confusion      Medication List    STOP taking these medications   amoxicillin-clavulanate 875-125 MG tablet Commonly known as:  AUGMENTIN     TAKE these medications    acetaminophen 500 MG tablet Commonly known as:  TYLENOL Take 500 mg by mouth every 6 (six) hours as needed for mild pain or headache.   aspirin 81 MG chewable tablet Chew 81 mg by mouth daily.   carvedilol 25 MG tablet Commonly known as:  COREG Take 25 mg by mouth 2 (two) times daily.   clopidogrel 75 MG tablet Commonly known as:  PLAVIX Take 75 mg by mouth daily.   feeding supplement Liqd Take 1 Container by mouth 2 (two) times daily between meals.   feeding supplement (ENSURE ENLIVE) Liqd Take 237 mLs by mouth 2 (two) times daily between meals.   fluconazole 200 MG tablet Commonly known as:  DIFLUCAN Take 1 tablet (200 mg total) by mouth daily.   magnesium oxide 400 MG tablet Commonly known as:  MAG-OX Take 400 mg by mouth 2 (two) times daily.   megestrol 400 MG/10ML suspension Commonly known as:  MEGACE Take 10 mLs (400 mg total) by mouth daily.   mirtazapine 30 MG tablet Commonly known as:  REMERON Take 1 tablet (30 mg total) by mouth at bedtime. What changed:  how much to take   multivitamin animal shapes (with Ca/FA) with C & FA chewable tablet Chew 1 tablet by mouth daily.   sodium chloride 0.9 % injection Inject 10 mLs into the vein daily. Inject 10 mL into left pelvic drain once daily   traMADol 50 MG tablet Commonly known as:  ULTRAM Take 1 tablet (50 mg total) by mouth every 6 (six) hours as needed for moderate pain.       Allergies  Allergen Reactions  . Oxycodone Other (See Comments)    confusion    Consultations:  GEN SURG   Procedures/Studies: Ct Abdomen Pelvis W Contrast  Result Date: 10/09/2018 CLINICAL DATA:  Ovarian cancer, pelvic cysts, LEFT lower quadrant/pelvic drain, recently discharged from hospital but returns today due to fever, pain at drain area, lack of drain output EXAM: CT ABDOMEN AND PELVIS WITH CONTRAST TECHNIQUE: Multidetector CT imaging of the abdomen and pelvis was performed using the standard protocol following  bolus administration of intravenous contrast. Sagittal and coronal MPR images reconstructed from axial data set. CONTRAST:  132mL OMNIPAQUE IOHEXOL 300 MG/ML SOLN IV. No oral contrast. COMPARISON:  10/03/2018 FINDINGS: Lower chest: Bibasilar atelectasis. Hepatobiliary: Elongated lateral segment LEFT lobe liver extending to LEFT lateral body wall. Gallbladder and liver otherwise normal appearance. Pancreas: Normal appearance. Incidentally noted duodenal diverticulum at pancreatic head. Spleen: Normal appearance Adrenals/Urinary Tract: Persistent peripelvic LEFT renal cyst. Adrenal glands, kidneys, ureters and bladder otherwise normal appearance Stomach/Bowel: Stomach decompressed. Increased stool in distal colon. Scattered sigmoid diverticula and chronic sigmoid wall thickening unchanged likely related to inflammation in pelvis. Remaining bowel loops unremarkable. Vascular/Lymphatic: Atherosclerotic calcifications aorta iliac arteries. 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 No contrast enhancement within this aneurysm. Prior stenting of the celiac and superior mesenteric arteries. No adenopathy. Reproductive: Uterus surgically absent. Nonvisualization of ovaries. Other: Surgical drain in the LEFT pelvis. Persistent fluid collection in the LEFT lateral pelvis, 9.0 x 4.8 x 3.9 cm consistent with abscess. The pigtailed of the drainage catheter is more anteriorly positioned within  the collection and previously seen. Increased perihepatic fluid in both the LEFT and RIGHT upper quadrants. Small amount of fluid at LEFT pericolic gutter. Free fluid in central pelvis. Additional ill-defined areas of fluid attenuation in the pelvis could represent unopacified bowel loops or additional small collections. No free intraperitoneal air. Supraumbilical ventral hernia containing transverse colon without  obstruction Musculoskeletal: No acute osseous findings. IMPRESSION: Persistent large abscess collection in the lateral LEFT pelvis with previously identified pigtail drainage catheter now located more anteriorly/less dependently within the collection than seen on prior study. Slightly increased free fluid at LEFT pericolic gutter and perihepatic versus previous study. Slightly more free fluid in pelvis than seen previously. 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 Sigmoid diverticulosis with chronic sigmoid wall thickening. Ventral hernia containing transverse colon without obstruction. Electronically Signed   By: Lavonia Dana M.D.   On: 10/09/2018 19:48   Ct Abdomen Pelvis W Contrast  Result Date: 10/03/2018 CLINICAL DATA:  Increased generalized weakness. Recent surgery for ovarian cancer with subsequent percutaneous drainage of left pelvic abscess. EXAM: CT ABDOMEN AND PELVIS WITH CONTRAST TECHNIQUE: Multidetector CT imaging of the abdomen and pelvis was performed using the standard protocol following bolus administration of intravenous contrast. CONTRAST:  59mL OMNIPAQUE IOHEXOL 300 MG/ML  SOLN COMPARISON:  CT 07/21/2018. FINDINGS: Lower chest: Interval improved aeration of the lung bases with resolution of the right pleural effusion. Interval stenting of the thoracoabdominal aorta, further described below. Coronary artery atherosclerosis noted. Hepatobiliary: The liver is normal in density without suspicious focal abnormality. No evidence of gallstones, gallbladder wall thickening or biliary dilatation. Pancreas: Unremarkable. No pancreatic ductal dilatation or surrounding inflammatory changes. Spleen: Normal in size without focally suspicious finding. There are tiny low-density lesions posteriorly in the spleen on image 17/3 which are stable. Adrenals/Urinary Tract: Both adrenal glands appear normal. Stable small renal cysts, largest in the lower pole of the left  kidney. No evidence of renal mass, urinary tract calculus or hydronephrosis. The bladder appears normal. Stomach/Bowel: Stable small periampullary duodenal diverticulum. The stomach and small bowel otherwise appear normal. There is a large amount of stool throughout the colon. There are loops of bowel which chronically protrude into postsurgical defects in the mid abdominal wall. These appear stable without evidence of incarceration. Sigmoid colon diverticulosis and wall thickening are similar to the previous study. Vascular/Lymphatic: There are no enlarged abdominal or pelvic lymph nodes. Interval stent grafting of the distal thoracic and proximal abdominal aorta. The superior extent of the stent is not imaged. It extends inferiorly to the renal arteries. There are fenestrations and stents in the proximal celiac trunk and superior mesenteric artery which appear patent. Contrast bolus is not optimal. The stent covers the previously demonstrated large saccular aneurysms which appear excluded from the aortic lumen. 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 No evidence of endograft leak. No new aneurysms are seen. The portal vein is patent. Reproductive: Hysterectomy.  No apparent adnexal mass. Other: Percutaneous left pelvic drain is in similar position, although was exchanged 5 days ago. Surrounding complex air-fluid collection with peripheral enhancement has enlarged, measuring 6.0 x 6.0 x 5.8 cm. The drain appears located in the posterior aspect of this collection. No separate or other enlarging pelvic fluid collections identified. Musculoskeletal: No acute or significant osseous findings. IMPRESSION: 1. Interval significant enlargement of left pelvic abscess.  The percutaneous drain appears well located within this collection, suggesting a persistent associated  colonic fistula. 2. No new intraperitoneal fluid collections or definite signs of metastatic disease identified. 3. Interval stenting of the distal thoracic and proximal abdominal aorta with exclusion of the previously demonstrated saccular aneurysms. The right-sided aneurysm has not significantly in size, although no endograft leak identified. Attention on follow-up recommended. 4.  Aortic Atherosclerosis (ICD10-I70.0). Electronically Signed   By: Richardean Sale M.D.   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 Pelvic drain was recently replaced on 09/28/2018. Patient presents with increased weakness and follow-up CT demonstrated enlargement of the left pelvic abscess despite the percutaneous drain. Only a small amount of thick purulent fluid could be aspirated from the drain at the bedside. Plan for drain injection and possible exchange and up sizing. EXAM: ABSCESS DRAIN INJECTION WITH FLUOROSCOPY DRAIN EXCHANGE FLUOROSCOPY MEDICATIONS: None ANESTHESIA/SEDATION: None COMPLICATIONS: None immediate. PROCEDURE: Informed written consent was obtained from the patient after a thorough discussion of the procedural risks, benefits and alternatives. All questions were addressed. Maximal Sterile Barrier Technique was utilized including caps, mask, sterile gowns, sterile gloves, sterile drape, hand hygiene and skin antiseptic. A timeout was performed prior to the initiation of the procedure. The left lower quadrant drain was prepped and draped in sterile fashion. Contrast was injected into the tube with some difficulty. The tube appeared to be partially occluded. Subsequently, brown thick foul-smelling fluid was aspirated from the collection. The catheter was cut and removed over a Bentson wire. A new 12 French drain was advanced into the collection and approximately 75 mL of the thick brown fluid was  removed. Follow-up drain injection was performed. Catheter was flushed with saline and attached to suction bulb. Skin was anesthetized with 1% lidocaine and the catheter was sutured to skin. FINDINGS: Old drain was well positioned within the abscess but not aspirating and flushing well. Contrast injection confirmed a persistent colonic fistula. After the drain was exchanged for a 12 French drain, 75 mL brown foul-smelling fluid was removed from the cavity. Drainage turned from the brown thick material to bloody material by the end of the procedure. IMPRESSION: 1. Residual left pelvic abscess collection with a colonic fistula. 2. Purulent stool-like material is draining from the pelvic abscess. The old drainage catheter was partially occluded, therefore, a new larger drain was placed. The patient currently has a 12 [place] drain. 3. Continue with routine flushing and will keep the drain to suction bulb in order to keep the pelvic abscess cavity decompressed. 4. Long-term plan for the drain is uncertain at this time. Electronically Signed   By: Markus Daft M.D.   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 Pelvic drain was recently replaced on 09/28/2018. Patient presents with increased weakness and follow-up CT demonstrated enlargement of the left pelvic abscess despite the percutaneous drain. Only a small amount of thick purulent fluid could be aspirated from the drain at the bedside. Plan for drain injection and possible exchange and up sizing. EXAM: ABSCESS DRAIN INJECTION WITH FLUOROSCOPY DRAIN EXCHANGE FLUOROSCOPY MEDICATIONS: None ANESTHESIA/SEDATION: None COMPLICATIONS: None immediate. PROCEDURE: Informed written consent was obtained from the patient after a thorough discussion of the procedural risks, benefits and alternatives. All questions were addressed. Maximal Sterile Barrier Technique was  utilized including caps, mask,  sterile gowns, sterile gloves, sterile drape, hand hygiene and skin antiseptic. A timeout was performed prior to the initiation of the procedure. The left lower quadrant drain was prepped and draped in sterile fashion. Contrast was injected into the tube with some difficulty. The tube appeared to be partially occluded. Subsequently, brown thick foul-smelling fluid was aspirated from the collection. The catheter was cut and removed over a Bentson wire. A new 12 French drain was advanced into the collection and approximately 75 mL of the thick brown fluid was removed. Follow-up drain injection was performed. Catheter was flushed with saline and attached to suction bulb. Skin was anesthetized with 1% lidocaine and the catheter was sutured to skin. FINDINGS: Old drain was well positioned within the abscess but not aspirating and flushing well. Contrast injection confirmed a persistent colonic fistula. After the drain was exchanged for a 12 French drain, 75 mL brown foul-smelling fluid was removed from the cavity. Drainage turned from the brown thick material to bloody material by the end of the procedure. IMPRESSION: 1. Residual left pelvic abscess collection with a colonic fistula. 2. Purulent stool-like material is draining from the pelvic abscess. The old drainage catheter was partially occluded, therefore, a new larger drain was placed. The patient currently has a 12 [place] drain. 3. Continue with routine flushing and will keep the drain to suction bulb in order to keep the pelvic abscess cavity decompressed. 4. Long-term plan for the drain is uncertain at this time. Electronically Signed   By: Markus Daft M.D.   On: 10/04/2018 13:39   Ir Catheter Tube Change  Result Date: 09/28/2018 INDICATION: 73 year old with history of peritoneal adenocarcinoma with a chronic abscess collection along the left pelvic sidewall associated with a colonic fistula. Percutaneous drain in this area  since 04/18/2018. Patient reports stool-like material leaking around the drain site. EXAM: DRAIN INJECTION WITH FLUOROSCOPY ABSCESS DRAIN EXCHANGE WITH FLUOROSCOPY MEDICATIONS: Patient is reportedly on oral antibiotics. ANESTHESIA/SEDATION: None COMPLICATIONS: None immediate. PROCEDURE: The drain dressings were removed. The radiopaque marker was outside of the body and one sidehole was outside of the body. Tip of the catheter was still underneath the skin. Informed written consent was obtained from the patient after a thorough discussion of the procedural risks, benefits and alternatives. All questions were addressed. Maximal Sterile Barrier Technique was utilized including caps, mask, sterile gowns, sterile gloves, sterile drape, hand hygiene and skin antiseptic. A timeout was performed prior to the initiation of the procedure. The catheter and surrounding skin was prepped and draped in sterile fashion. Contrast was injected through the tube. The catheter was cut and removed over a Bentson wire. [place] catheter was advanced into the pelvis. Additional contrast was injected. Catheter was removed over the wire and a new 10.2 [place] multipurpose drain was advanced over the wire. Approximately 30 mL of pink purulent fluid was removed from the pelvic abscess collection. Collection was irrigated with sterile saline. Follow-up drain injection was performed. Catheter was flushed and attached to gravity bag. Skin was anesthetized with 1% lidocaine and the catheter was secured to the skin with a suture and StatLock device. FINDINGS: Drainage catheter was partially dislodged with a side hole outside of the body. New drain was successfully advanced back into the pelvic collection and 30 mL of purulent fluid was removed. The abscess collection appear to be decompressed at the end of the procedure Drain injection confirmed a chronic colonic fistula. IMPRESSION: Successful exchange of the abscess drainage catheter. Approximately  30 mL of purulent fluid  was removed from the chronic pelvic collection. Abscess cavity was decompressed at the end of the procedure. Persistent colonic fistula. Electronically Signed   By: Markus Daft M.D.   On: 09/28/2018 16:18   Dg Sinus/fist Tube Chk-non Gi  Result Date: 09/21/2018 INDICATION: Left pelvic abscess with persistent fistula to the adjacent sigmoid colon. EXAM: INJECTION OF PRE-EXISTING PERCUTANEOUS ABSCESS DRAIN UNDER FLUOROSCOPY MEDICATIONS: No additional medications. ANESTHESIA/SEDATION: None CONTRAST:  80 mL Omnipaque 300 FLUOROSCOPY TIME:  10 seconds.  3.2 mGy. COMPLICATIONS: None immediate. PROCEDURE: Informed written consent was obtained from the patient after a thorough discussion of the procedural risks, benefits and alternatives. All questions were addressed. Maximal Sterile Barrier Technique was utilized including caps, mask, sterile gowns, sterile gloves, sterile drape, hand hygiene and skin antiseptic. A timeout was performed prior to the initiation of the procedure. Contrast was injected via the pre-existing percutaneous drain. Fluoroscopic spot images were obtained. The drain was flushed and connected to a gravity drainage bag. FINDINGS: There remains a small triangular shaped decompressed abscess cavity in the left lateral lower pelvis. Superior fistula again noted directly communicating with the lumen of the sigmoid colon. Findings are similar to prior injection studies including the most recent dated 07/20/2018. 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 Electronically Signed   By: Aletta Edouard M.D.   On: 09/21/2018 12:42       Subjective: Patient reports abdominal pain but is not in acute extremis. Discussed the case with patient's son [name] after discussing it with her primary oncologist Dr. [name] who has accepted the patient in transfer to [facility].  Her son [name] is  agreement with plan of care as this patient.  Discharge Exam: Vitals:   10/10/18 0625 10/10/18 0851  BP: 132/61 (!) 124/52  Pulse: 96 99  Resp: 15   Temp: 98.4 F (36.9 C)   SpO2: 100%    Vitals:   10/10/18 0111 10/10/18 0253 10/10/18 0625 10/10/18 0851  BP: 137/63 107/72 132/61 (!) 124/52  Pulse: (!) 102 (!) 102 96 99  Resp:  (!) 24 15   Temp: 98.3 F (36.8 C) 99.8 F (37.7 C) 98.4 F (36.9 C)   TempSrc: Oral Oral Oral   SpO2: 99% 94% 100%     General: Pt is alert, awake, not in acute distress, poor insight and cognition with known chronic dementia Cardiovascular: RRR, S1/S2 +, no rubs, no gallops Respiratory: CTA bilaterally, no wheezing, no rhonchi Abdominal: Soft, diffusely tender to palpation no rebound no guarding is not rigid Extremities: no edema, no cyanosis cachectic    The results of significant diagnostics from this hospitalization (including imaging, microbiology, ancillary and laboratory) are listed below for reference.     Microbiology: Recent Results (from the past 240 hour(s))  Culture, blood (routine x 2)     Status: None   Collection Time: 10/03/18  2:00 PM  Result Value Ref Range Status   Specimen Description BLOOD RIGHT ANTECUBITAL  Final   Special Requests   Final    BOTTLES DRAWN AEROBIC AND ANAEROBIC Blood Culture adequate volume   Culture   Final    NO GROWTH 5 DAYS Performed at [facility], [address], [place], [place] [address]    Report Status 10/08/2018 FINAL  Final  Culture, blood (routine x 2)     Status: None   Collection Time: 10/03/18  2:25 PM  Result Value Ref Range Status  Specimen Description BLOOD LEFT ARM  Final   Special Requests   Final    BOTTLES DRAWN AEROBIC AND ANAEROBIC Blood Culture results may not be optimal due to an inadequate volume of blood received in culture bottles   Culture   Final    NO GROWTH 5 DAYS Performed at [facility], [place] [address] [place], [place] [address]    Report  Status 10/08/2018 FINAL  Final  Culture, blood (routine x 2)     Status: None (Preliminary result)   Collection Time: 10/09/18  5:30 PM  Result Value Ref Range Status   Specimen Description BLOOD RIGHT HAND  Final   Special Requests   Final    BOTTLES DRAWN AEROBIC AND ANAEROBIC Blood Culture results may not be optimal due to an inadequate volume of blood received in culture bottles   Culture   Final    NO GROWTH < 24 HOURS Performed at [facility], [place] [address] [place], [place] [address]    Report Status PENDING  Incomplete  Culture, blood (routine x 2)     Status: None (Preliminary result)   Collection Time: 10/09/18  5:35 PM  Result Value Ref Range Status   Specimen Description BLOOD LEFT HAND  Final   Special Requests   Final    AEROBIC BOTTLE ONLY Blood Culture results may not be optimal due to an inadequate volume of blood received in culture bottles   Culture   Final    NO GROWTH < 24 HOURS Performed at [facility], [place] [address] [place], [place] [address]    Report Status PENDING  Incomplete  SARS Coronavirus 2 (CEPHEID - Performed in [place] hospital lab), Hosp Order     Status: None   Collection Time: 10/09/18  9:02 PM  Result Value Ref Range Status   SARS Coronavirus 2 NEGATIVE NEGATIVE Final    Comment: (NOTE) If result is NEGATIVE SARS-CoV-2 target nucleic acids are NOT DETECTED. The SARS-CoV-2 RNA is generally detectable in upper and lower  respiratory specimens during the acute phase of infection. The lowest  concentration of SARS-CoV-2 viral copies this assay can detect is 250  copies / mL. A negative result does not preclude SARS-CoV-2 infection  and should not be used as the sole basis for treatment or other  patient management decisions.  A negative result may occur with  improper specimen collection / handling, submission of specimen other  than nasopharyngeal swab, presence of viral mutation(s) within the  areas targeted by this assay,  and inadequate number of viral copies  (<250 copies / mL). A negative result must be combined with clinical  observations, patient history, and epidemiological information. If result is POSITIVE SARS-CoV-2 target nucleic acids are DETECTED. The SARS-CoV-2 RNA is generally detectable in upper and lower  respiratory specimens dur ing the acute phase of infection.  Positive  results are indicative of active infection with SARS-CoV-2.  Clinical  correlation with patient history and other diagnostic information is  necessary to determine patient infection status.  Positive results do  not rule out bacterial infection or co-infection with other viruses. If result is PRESUMPTIVE POSTIVE SARS-CoV-2 nucleic acids MAY BE PRESENT.   A presumptive positive result was obtained on the submitted specimen  and confirmed on repeat testing.  While 2019 novel coronavirus  (SARS-CoV-2) nucleic acids may be present in the submitted sample  additional confirmatory testing may be necessary for epidemiological  and / or clinical management  purposes  to differentiate between  SARS-CoV-2 and other Sarbecovirus currently known to infect humans.  If clinically indicated additional testing with an alternate test  methodology [phone]) is advised. The SARS-CoV-2 RNA is generally  detectable in upper and lower respiratory sp ecimens during the acute  phase of infection. The expected result is Negative. Fact Sheet for Patients:  StrictlyIdeas.no Fact Sheet for Healthcare Providers: BankingDealers.co.za This test is not yet approved or cleared by the [place] FDA and has been authorized for detection and/or diagnosis of SARS-CoV-2 by FDA under an Emergency Use Authorization (EUA).  This EUA will remain in effect (meaning this test can be used) for the duration of the COVID-19 declaration under Section 564(b)(1) of the Act, 21 U.S.C. section 360bbb-3(b)(1), unless  the authorization is terminated or revoked sooner. Performed at [facility], [place] [address], [place], [place] [address]      Labs: BNP (last 3 results) Recent Labs    06/30/18 1705  BNP 629.5*   Basic Metabolic Panel: Recent Labs  Lab 10/04/18 0126 10/05/18 0133 10/06/18 0402 10/09/18 1730 10/10/18 0239  NA 138 137 136 137 139  K 3.8 3.6 3.7 3.8 3.1*  CL 105 106 108 103 110  CO2 20* 20* 21* 17* 19*  GLUCOSE 75 109* 112* 70 76  BUN 18 12 8 18 14   CREATININE 0.88 0.85 0.72 1.00 0.89  CALCIUM 9.2 8.8* 8.5* 9.0 8.2*   Liver Function Tests: Recent Labs  Lab 10/04/18 0126 10/05/18 0133 10/06/18 0402 10/09/18 1730 10/10/18 0239  AST 18 20 19 20 17   ALT 21 18 18 19 14   ALKPHOS 53 57 56 63 50  BILITOT 0.8 0.5 0.3 0.7 0.7  PROT 6.8 6.7 6.4* 7.7 6.5  ALBUMIN 2.2* 2.1* 2.0* 2.3* 1.9*   No results for input(s): LIPASE, AMYLASE in the last 168 hours. No results for input(s): AMMONIA in the last 168 hours. CBC: Recent Labs  Lab 10/03/18 1310 10/04/18 0126 10/05/18 0133 10/06/18 0402 10/09/18 1920 10/10/18 0239  WBC 12.1* 11.2* 9.0 8.0 7.9 11.2*  NEUTROABS 8.0*  --   --   --  6.5 8.7*  HGB 8.1* 7.1* 7.2* 7.2* 12.3 6.7*  HCT 26.1* 22.2* 22.3* 22.6* 39.5 20.8*  MCV 68.7* 67.5* 67.2* 66.7* 66.6* 66.2*  PLT 399 334 337 350 257 366   Cardiac Enzymes: No results for input(s): CKTOTAL, CKMB, CKMBINDEX, TROPONINI in the last 168 hours. BNP: Invalid input(s): POCBNP CBG: No results for input(s): GLUCAP in the last 168 hours. D-Dimer No results for input(s): DDIMER in the last 72 hours. Hgb A1c No results for input(s): HGBA1C in the last 72 hours. Lipid Profile No results for input(s): CHOL, HDL, LDLCALC, TRIG, CHOLHDL, LDLDIRECT in the last 72 hours. Thyroid function studies No results for input(s): TSH, T4TOTAL, T3FREE, THYROIDAB in the last 72 hours.  Invalid input(s): FREET3 Anemia work up No results for input(s): VITAMINB12, FOLATE, FERRITIN, TIBC,  IRON, RETICCTPCT in the last 72 hours. Urinalysis    Component Value Date/Time   COLORURINE YELLOW 10/09/2018 2151   APPEARANCEUR CLEAR 10/09/2018 2151   LABSPEC >1.046 (H) 10/09/2018 2151   PHURINE 5.0 10/09/2018 2151   GLUCOSEU NEGATIVE 10/09/2018 2151   HGBUR NEGATIVE 10/09/2018 2151   BILIRUBINUR NEGATIVE 10/09/2018 2151   [place] NEGATIVE 10/09/2018 2151   PROTEINUR 30 (A) 10/09/2018 2151   NITRITE NEGATIVE 10/09/2018 2151   LEUKOCYTESUR NEGATIVE 10/09/2018 2151   Sepsis Labs Invalid input(s): PROCALCITONIN,  WBC,  LACTICIDVEN Microbiology Recent  Results (from the past 240 hour(s))  Culture, blood (routine x 2)     Status: None   Collection Time: 10/03/18  2:00 PM  Result Value Ref Range Status   Specimen Description BLOOD RIGHT ANTECUBITAL  Final   Special Requests   Final    BOTTLES DRAWN AEROBIC AND ANAEROBIC Blood Culture adequate volume   Culture   Final    NO GROWTH 5 DAYS Performed at [facility], [address], [place], [place] [address]    Report Status 10/08/2018 FINAL  Final  Culture, blood (routine x 2)     Status: None   Collection Time: 10/03/18  2:25 PM  Result Value Ref Range Status   Specimen Description BLOOD LEFT ARM  Final   Special Requests   Final    BOTTLES DRAWN AEROBIC AND ANAEROBIC Blood Culture results may not be optimal due to an inadequate volume of blood received in culture bottles   Culture   Final    NO GROWTH 5 DAYS Performed at [facility], [place] [address], [place], [place] [address]    Report Status 10/08/2018 FINAL  Final  Culture, blood (routine x 2)     Status: None (Preliminary result)   Collection Time: 10/09/18  5:30 PM  Result Value Ref Range Status   Specimen Description BLOOD RIGHT HAND  Final   Special Requests   Final    BOTTLES DRAWN AEROBIC AND ANAEROBIC Blood Culture results may not be optimal due to an inadequate volume of blood received in culture bottles   Culture   Final    NO GROWTH < 24  HOURS Performed at [facility], [place] [address], [place], [place] [address]    Report Status PENDING  Incomplete  Culture, blood (routine x 2)     Status: None (Preliminary result)   Collection Time: 10/09/18  5:35 PM  Result Value Ref Range Status   Specimen Description BLOOD LEFT HAND  Final   Special Requests   Final    AEROBIC BOTTLE ONLY Blood Culture results may not be optimal due to an inadequate volume of blood received in culture bottles   Culture   Final    NO GROWTH < 24 HOURS Performed at [facility], [place] [address] [place], [place] [address]    Report Status PENDING  Incomplete  SARS Coronavirus 2 (CEPHEID - Performed in [place] hospital lab), Hosp Order     Status: None   Collection Time: 10/09/18  9:02 PM  Result Value Ref Range Status   SARS Coronavirus 2 NEGATIVE NEGATIVE Final    Comment: (NOTE) If result is NEGATIVE SARS-CoV-2 target nucleic acids are NOT DETECTED. The SARS-CoV-2 RNA is generally detectable in upper and lower  respiratory specimens during the acute phase of infection. The lowest  concentration of SARS-CoV-2 viral copies this assay can detect is 250  copies / mL. A negative result does not preclude SARS-CoV-2 infection  and should not be used as the sole basis for treatment or other  patient management decisions.  A negative result may occur with  improper specimen collection / handling, submission of specimen other  than nasopharyngeal swab, presence of viral mutation(s) within the  areas targeted by this assay, and inadequate number of viral copies  (<250 copies / mL). A negative result must be combined with clinical  observations, patient history, and epidemiological information. If result is POSITIVE SARS-CoV-2 target nucleic acids are DETECTED. The SARS-CoV-2 RNA  is generally detectable in upper and lower  respiratory specimens dur ing the acute phase of infection.  Positive  results are indicative of active infection  with SARS-CoV-2.  Clinical  correlation with patient history and other diagnostic information is  necessary to determine patient infection status.  Positive results do  not rule out bacterial infection or co-infection with other viruses. If result is PRESUMPTIVE POSTIVE SARS-CoV-2 nucleic acids MAY BE PRESENT.   A presumptive positive result was obtained on the submitted specimen  and confirmed on repeat testing.  While 2019 novel coronavirus  (SARS-CoV-2) nucleic acids may be present in the submitted sample  additional confirmatory testing may be necessary for epidemiological  and / or clinical management purposes  to differentiate between  SARS-CoV-2 and other Sarbecovirus currently known to infect humans.  If clinically indicated additional testing with an alternate test  methodology [phone]) is advised. The SARS-CoV-2 RNA is generally  detectable in upper and lower respiratory sp ecimens during the acute  phase of infection. The expected result is Negative. Fact Sheet for Patients:  StrictlyIdeas.no Fact Sheet for Healthcare Providers: BankingDealers.co.za This test is not yet approved or cleared by the [place] FDA and has been authorized for detection and/or diagnosis of SARS-CoV-2 by FDA under an Emergency Use Authorization (EUA).  This EUA will remain in effect (meaning this test can be used) for the duration of the COVID-19 declaration under Section 564(b)(1) of the Act, 21 U.S.C. section 360bbb-3(b)(1), unless the authorization is terminated or revoked sooner. Performed at [facility], [place] [address] [place], [place] [address]      Time coordinating discharge: 35 minutes  SIGNED:   Nicolette Bang, MD  [facility] 10/10/2018, 10:06 AM Pager   If 7PM-7AM, please contact night-coverage [email or web address] Password [email or web address]

## 2018-10-10 NOTE — Progress Notes (Signed)
Patient just Left [facility] via EMS ambulance and going to [facility] for further care.  Patient stable minor abdominal discomfort at pain 2/10.  Documentation given to transporter and report was earlier given to receiving RN Alena Bills by day shift RN Kisti D.

## 2018-10-10 NOTE — Plan of Care (Signed)
Problem: Education: Goal: Knowledge of General Education information will improve Description Including pain rating scale, medication(s)/side effects and non-pharmacologic comfort measures Outcome: Progressing   Problem: Health Behavior/Discharge Planning: Goal: Ability to manage health-related needs will improve Outcome: Progressing   Problem: Clinical Measurements: Goal: Respiratory complications will improve Outcome: Progressing   Problem: Coping: Goal: Level of anxiety will decrease Outcome: Progressing   Problem: Elimination: Goal: Will not experience complications related to urinary retention Outcome: Progressing   Problem: Pain Managment: Goal: General experience of comfort will improve Outcome: Progressing   Problem: Safety: Goal: Ability to remain free from injury will improve Outcome: Progressing   Problem: Skin Integrity: Goal: Risk for impaired skin integrity will decrease Outcome: Progressing

## 2018-10-10 NOTE — Progress Notes (Addendum)
[facility] Progress Note     Subjective: CC-  Patient reports persistent abdominal pain, about the same as yesterday. Tylenol helps. Drain with minimal feculent output. IR to see for drain repositioning/possible upsizing.   Hemoglobin 6.7 this AM, getting 2 units PRBCs.  Objective: Vital signs in last 24 hours: Temp:  [97.7 F (36.5 C)-100.9 F (38.3 C)] 98.4 F (36.9 C) (05/04 0625) Pulse Rate:  [96-113] 96 (05/04 0625) Resp:  [15-26] 15 (05/04 0625) BP: (107-137)/(45-72) 132/61 (05/04 0625) SpO2:  [94 %-100 %] 100 % (05/04 0625)    Intake/Output from previous day: 05/03 0701 - 05/04 0700 In: 236.9 [I.V.:236.9] Out: 205 [Urine:200; Drains:5] Intake/Output this shift: No intake/output data recorded.  PE: Gen:  Alert, NAD HEENT: EOM's intact, pupils equal and round Card: tachy Pulm:  effort normal Abd: Soft, distended, mild diffuse tenderness with voluntary guarding, +BS, drain with feculent output Skin: no rashes noted, warm and dry  Lab Results:  Recent Labs    10/09/18 1920 10/10/18 0239  WBC 7.9 11.2*  HGB 12.3 6.7*  HCT 39.5 20.8*  PLT 257 366   BMET Recent Labs    10/09/18 1730 10/10/18 0239  NA 137 139  K 3.8 3.1*  CL 103 110  CO2 17* 19*  GLUCOSE 70 76  BUN 18 14  CREATININE 1.00 0.89  CALCIUM 9.0 8.2*   PT/INR No results for input(s): LABPROT, INR in the last 72 hours. CMP     Component Value Date/Time   NA 139 10/10/2018 0239   K 3.1 (L) 10/10/2018 0239   CL 110 10/10/2018 0239   CO2 19 (L) 10/10/2018 0239   GLUCOSE 76 10/10/2018 0239   BUN 14 10/10/2018 0239   CREATININE 0.89 10/10/2018 0239   CREATININE 0.80 07/19/2018 1157   CALCIUM 8.2 (L) 10/10/2018 0239   PROT 6.5 10/10/2018 0239   ALBUMIN 1.9 (L) 10/10/2018 0239   AST 17 10/10/2018 0239   AST 17 07/19/2018 1157   ALT 14 10/10/2018 0239   ALT <6 07/19/2018 1157   ALKPHOS 50 10/10/2018 0239   BILITOT 0.7 10/10/2018 0239   BILITOT 0.4 07/19/2018 1157    GFRNONAA >60 10/10/2018 0239   GFRNONAA >60 07/19/2018 1157   GFRAA >60 10/10/2018 0239   GFRAA >60 07/19/2018 1157   Lipase  No results found for: LIPASE     Studies/Results: Ct Abdomen Pelvis W Contrast  Result Date: 10/09/2018 CLINICAL DATA:  Ovarian cancer, pelvic cysts, LEFT lower quadrant/pelvic drain, recently discharged from hospital but returns today due to fever, pain at drain area, lack of drain output EXAM: CT ABDOMEN AND PELVIS WITH CONTRAST TECHNIQUE: Multidetector CT imaging of the abdomen and pelvis was performed using the standard protocol following bolus administration of intravenous contrast. Sagittal and coronal MPR images reconstructed from axial data set. CONTRAST:  135mL OMNIPAQUE IOHEXOL 300 MG/ML SOLN IV. No oral contrast. COMPARISON:  10/03/2018 FINDINGS: Lower chest: Bibasilar atelectasis. Hepatobiliary: Elongated lateral segment LEFT lobe liver extending to LEFT lateral body wall. Gallbladder and liver otherwise normal appearance. Pancreas: Normal appearance. Incidentally noted duodenal diverticulum at pancreatic head. Spleen: Normal appearance Adrenals/Urinary Tract: Persistent peripelvic LEFT renal cyst. Adrenal glands, kidneys, ureters and bladder otherwise normal appearance Stomach/Bowel: Stomach decompressed. Increased stool in distal colon. Scattered sigmoid diverticula and chronic sigmoid wall thickening unchanged likely related to inflammation in pelvis. Remaining bowel loops unremarkable. Vascular/Lymphatic: Atherosclerotic calcifications aorta iliac arteries. 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 No contrast enhancement within this aneurysm. Prior stenting of the celiac and superior mesenteric arteries. No adenopathy. Reproductive: Uterus surgically absent. Nonvisualization of ovaries. Other: Surgical drain in the LEFT pelvis. Persistent  fluid collection in the LEFT lateral pelvis, 9.0 x 4.8 x 3.9 cm consistent with abscess. The pigtailed of the drainage catheter is more anteriorly positioned within the collection and previously seen. Increased perihepatic fluid in both the LEFT and RIGHT upper quadrants. Small amount of fluid at LEFT pericolic gutter. Free fluid in central pelvis. Additional ill-defined areas of fluid attenuation in the pelvis could represent unopacified bowel loops or additional small collections. No free intraperitoneal air. Supraumbilical ventral hernia containing transverse colon without obstruction Musculoskeletal: No acute osseous findings. IMPRESSION: Persistent large abscess collection in the lateral LEFT pelvis with previously identified pigtail drainage catheter now located more anteriorly/less dependently within the collection than seen on prior study. Slightly increased free fluid at LEFT pericolic gutter and perihepatic versus previous study. Slightly more free fluid in pelvis than seen previously. 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 Sigmoid diverticulosis with chronic sigmoid wall thickening. Ventral hernia containing transverse colon without obstruction. Electronically Signed   By: Lavonia Dana M.D.   On: 10/09/2018 19:48    Anti-infectives: Anti-infectives (From admission, onward)   Start     Dose/Rate Route Frequency Ordered Stop   10/10/18 1000  fluconazole (DIFLUCAN) tablet 200 mg     200 mg Oral Daily 10/10/18 0058     10/10/18 0600  meropenem (MERREM) 1 g in sodium chloride 0.9 % 100 mL IVPB     1 g 200 mL/hr over 30 Minutes Intravenous Every 12 hours 10/09/18 2343     10/10/18 0300  piperacillin-tazobactam (ZOSYN) IVPB 3.375 g  Status:  Discontinued     3.375 g 12.5 mL/hr over 240 Minutes Intravenous Every 8 hours 10/09/18 2023 10/09/18 2343   10/09/18 2030  piperacillin-tazobactam (ZOSYN) IVPB 4.5 g  Status:  Discontinued     4.5 g 200 mL/hr  over 30 Minutes Intravenous  Once 10/09/18 2021 10/09/18 2022   10/09/18 2030  piperacillin-tazobactam (ZOSYN) IVPB 3.375 g     3.375 g 100 mL/hr over 30 Minutes Intravenous  Once 10/09/18 2022 10/09/18 2151       Assessment/Plan COPD Aortic abdominal aneurysm s/p endovascular stenting, on plavix CHF Protein calorie malnutrition Symptomatic anemia - Hgb 6.7, getting 2 units PRBCs  -Stage IV ovarian cancer, peritoneal carcinomatosis, s/p neoadjuvant chemotherapy and debulking surgery in 01/2018 at [facility] -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 Large pelvic abscess with drain not adequately positioned/draining, IR to see for repositioning/upsizing. Continue IV abx. Will need f/u with initial surgeon at [facility], but if not improving may need to consider transferring to [facility]. Could consider palliative care consult for ongoing goals of care discussion.  ID - zosyn 5/3, merrem 5/4>>, diflucan 5/4>> FEN - IVF, NPO VTE - SCDs, hold chemical DVT prophylaxis due to bleeding Foley - none Follow up - [facility]   LOS: 1 day    Wellington Hampshire , [facility] Surgery 10/10/2018, 8:43 AM Pager: [phone] Mon-Thurs 7:00 am-4:30 pm Fri 7:00 am -11:30 AM Sat-Sun 7:00 am-11:30 am

## 2018-10-10 NOTE — ED Notes (Signed)
Son: [name] To be reached at [phone]

## 2018-10-10 NOTE — Progress Notes (Signed)
This RN spoke to [place] with the transportation center at [facility] Report given to Leonides Sake, RN and Linna Hoff with transport at [facility]. All questions answered to satisfaction. PIV remains in place. AVS printed and placed in discharge packet. EMTALA completed by Barb Merino, MD to be printed by nightshift RN with updated VS and time of transfer. Medical necessity form completed and printed. EMS report printed. Will continue to monitor.

## 2018-10-10 NOTE — Progress Notes (Signed)
CRITICAL VALUE STICKER  CRITICAL VALUE: hgb 6.7  DATE & TIME NOTIFIED: 05/04 0427   MD NOTIFIED: message sent to Hospitalist awaiting call   Will continue to monitor pt.

## 2018-10-11 LAB — TYPE AND SCREEN
ABO/RH(D): B POS
Antibody Screen: NEGATIVE
Unit division: 0

## 2018-10-11 LAB — BPAM RBC
Blood Product Expiration Date: 202005192359
ISSUE DATE / TIME: 202005041223
Unit Type and Rh: 5100

## 2018-10-11 MED ORDER — AMLODIPINE BESYLATE 5 MG PO TABS
5.00 | ORAL_TABLET | ORAL | Status: DC
Start: 2018-10-15 — End: 2018-10-11

## 2018-10-11 MED ORDER — DEXTROSE IN LACTATED RINGERS 5 % IV SOLN
50.00 | INTRAVENOUS | Status: DC
Start: ? — End: 2018-10-11

## 2018-10-11 MED ORDER — MIRTAZAPINE 15 MG PO TABS
30.00 | ORAL_TABLET | ORAL | Status: DC
Start: 2018-10-14 — End: 2018-10-11

## 2018-10-11 MED ORDER — HYDRALAZINE HCL 50 MG PO TABS
50.00 | ORAL_TABLET | ORAL | Status: DC
Start: 2018-10-15 — End: 2018-10-11

## 2018-10-11 MED ORDER — GENERIC EXTERNAL MEDICATION
Status: DC
Start: ? — End: 2018-10-11

## 2018-10-11 MED ORDER — ACETAMINOPHEN 325 MG PO TABS
650.00 | ORAL_TABLET | ORAL | Status: DC
Start: ? — End: 2018-10-11

## 2018-10-11 MED ORDER — LISINOPRIL 40 MG PO TABS
40.00 | ORAL_TABLET | ORAL | Status: DC
Start: 2018-10-15 — End: 2018-10-11

## 2018-10-11 MED ORDER — POLYETHYLENE GLYCOL 3350 17 G PO PACK
17.00 | PACK | ORAL | Status: DC
Start: 2018-10-15 — End: 2018-10-11

## 2018-10-11 MED ORDER — CLOPIDOGREL BISULFATE 75 MG PO TABS
75.00 | ORAL_TABLET | ORAL | Status: DC
Start: 2018-10-15 — End: 2018-10-11

## 2018-10-11 MED ORDER — ONDANSETRON 4 MG PO TBDP
4.00 | ORAL_TABLET | ORAL | Status: DC
Start: ? — End: 2018-10-11

## 2018-10-11 MED ORDER — ENOXAPARIN SODIUM 40 MG/0.4ML ~~LOC~~ SOLN
40.00 | SUBCUTANEOUS | Status: DC
Start: 2018-10-11 — End: 2018-10-11

## 2018-10-11 MED ORDER — MEGESTROL ACETATE 40 MG/ML PO SUSP
400.00 | ORAL | Status: DC
Start: 2018-10-12 — End: 2018-10-11

## 2018-10-11 MED ORDER — CEFEPIME HCL 2 GM/100ML IV SOLN
2.00 | INTRAVENOUS | Status: DC
Start: 2018-10-13 — End: 2018-10-11

## 2018-10-11 MED ORDER — CARVEDILOL 25 MG PO TABS
25.00 | ORAL_TABLET | ORAL | Status: DC
Start: 2018-10-14 — End: 2018-10-11

## 2018-10-11 MED ORDER — ASPIRIN 81 MG PO CHEW
81.00 | CHEWABLE_TABLET | ORAL | Status: DC
Start: 2018-10-15 — End: 2018-10-11

## 2018-10-11 MED ORDER — METRONIDAZOLE IN NACL 5-0.79 MG/ML-% IV SOLN
500.00 | INTRAVENOUS | Status: DC
Start: 2018-10-13 — End: 2018-10-11

## 2018-10-12 MED ORDER — GENERIC EXTERNAL MEDICATION
Status: DC
Start: ? — End: 2018-10-12

## 2018-10-12 MED ORDER — ACETAMINOPHEN 325 MG PO TABS
650.00 | ORAL_TABLET | ORAL | Status: DC
Start: ? — End: 2018-10-12

## 2018-10-12 MED ORDER — DIPHENOXYLATE-ATROPINE 2.5-0.025 MG/5ML PO LIQD
5.00 | ORAL | Status: DC
Start: ? — End: 2018-10-12

## 2018-10-13 ENCOUNTER — Other Ambulatory Visit: Payer: Self-pay | Admitting: Gynecologic Oncology

## 2018-10-13 ENCOUNTER — Inpatient Hospital Stay: Payer: Medicare PPO | Admitting: Gynecologic Oncology

## 2018-10-13 DIAGNOSIS — C569 Malignant neoplasm of unspecified ovary: Secondary | ICD-10-CM

## 2018-10-13 MED ORDER — GENERIC EXTERNAL MEDICATION
3.00 | Status: DC
Start: 2018-10-14 — End: 2018-10-13

## 2018-10-13 MED ORDER — GENERIC EXTERNAL MEDICATION
Status: DC
Start: ? — End: 2018-10-13

## 2018-10-13 MED ORDER — ENOXAPARIN SODIUM 40 MG/0.4ML ~~LOC~~ SOLN
40.00 | SUBCUTANEOUS | Status: DC
Start: 2018-10-15 — End: 2018-10-13

## 2018-10-13 MED ORDER — SODIUM CHLORIDE FLUSH 0.9 % IV SOLN
10.00 | INTRAVENOUS | Status: DC
Start: 2018-10-14 — End: 2018-10-13

## 2018-10-13 NOTE — Progress Notes (Signed)
Labs for 10/20/18 visit per Dr. Skeet Latch

## 2018-10-14 ENCOUNTER — Encounter: Payer: Self-pay | Admitting: Oncology

## 2018-10-14 ENCOUNTER — Telehealth: Payer: Self-pay | Admitting: *Deleted

## 2018-10-14 ENCOUNTER — Telehealth: Payer: Self-pay | Admitting: Oncology

## 2018-10-14 ENCOUNTER — Other Ambulatory Visit: Payer: Self-pay | Admitting: Gynecologic Oncology

## 2018-10-14 DIAGNOSIS — R188 Other ascites: Secondary | ICD-10-CM

## 2018-10-14 DIAGNOSIS — N739 Female pelvic inflammatory disease, unspecified: Secondary | ICD-10-CM

## 2018-10-14 LAB — CULTURE, BLOOD (ROUTINE X 2)
Culture: NO GROWTH
Culture: NO GROWTH

## 2018-10-14 MED ORDER — GENERIC EXTERNAL MEDICATION
Status: DC
Start: ? — End: 2018-10-14

## 2018-10-14 NOTE — Telephone Encounter (Signed)
Called and spoke with the patient's son, gave apapts for next week

## 2018-10-14 NOTE — Progress Notes (Unsigned)
CT AP ordered per recommendation from ID to follow up on pelvic fluid collection/abscess/chronic fistula.  Drain was replaced and upsized at [facility] during recent surgery.

## 2018-10-14 NOTE — Telephone Encounter (Signed)
Left a message for Jo Parker with apt for CT scan on 11/01/18 at 1:30 (1:15 arrival, Npo 4 hours before and to pick up contrast at apt on 10/20/18).

## 2018-10-19 ENCOUNTER — Telehealth: Payer: Self-pay

## 2018-10-19 NOTE — Telephone Encounter (Signed)
Jo Parker, [facility] OT called to get orders for OT.  Called Carolynn Sayers, RN with [facility] per Dr. Alvy Bimler she is is not signing any orders per previous conversation. Please call Dr. Skeet Latch for all home health orders. She verbalized understanding.

## 2018-10-19 NOTE — Telephone Encounter (Signed)
Debbie, OT was calling to obtain an order for Jo Parker for visits. She would like to see Jo Parker 2 x a week for 2 weeks then 1 x a week for 1 week. Told Debbie that this will be reviewed with Dr. Skeet Latch tomorrow.

## 2018-10-20 ENCOUNTER — Inpatient Hospital Stay: Payer: Medicare PPO | Attending: Hematology and Oncology

## 2018-10-20 ENCOUNTER — Other Ambulatory Visit: Payer: Self-pay

## 2018-10-20 ENCOUNTER — Encounter: Payer: Self-pay | Admitting: Gynecologic Oncology

## 2018-10-20 ENCOUNTER — Inpatient Hospital Stay (HOSPITAL_BASED_OUTPATIENT_CLINIC_OR_DEPARTMENT_OTHER): Payer: Medicare PPO | Admitting: Gynecologic Oncology

## 2018-10-20 ENCOUNTER — Inpatient Hospital Stay: Payer: Medicare PPO

## 2018-10-20 VITALS — BP 110/44 | HR 85 | Temp 98.7°F | Resp 17 | Ht 63.0 in

## 2018-10-20 DIAGNOSIS — C569 Malignant neoplasm of unspecified ovary: Secondary | ICD-10-CM | POA: Insufficient documentation

## 2018-10-20 DIAGNOSIS — E876 Hypokalemia: Secondary | ICD-10-CM | POA: Diagnosis not present

## 2018-10-20 DIAGNOSIS — F329 Major depressive disorder, single episode, unspecified: Secondary | ICD-10-CM | POA: Insufficient documentation

## 2018-10-20 DIAGNOSIS — Z803 Family history of malignant neoplasm of breast: Secondary | ICD-10-CM | POA: Insufficient documentation

## 2018-10-20 DIAGNOSIS — Z8049 Family history of malignant neoplasm of other genital organs: Secondary | ICD-10-CM | POA: Diagnosis not present

## 2018-10-20 DIAGNOSIS — Z87891 Personal history of nicotine dependence: Secondary | ICD-10-CM | POA: Insufficient documentation

## 2018-10-20 DIAGNOSIS — D649 Anemia, unspecified: Secondary | ICD-10-CM | POA: Insufficient documentation

## 2018-10-20 DIAGNOSIS — C786 Secondary malignant neoplasm of retroperitoneum and peritoneum: Secondary | ICD-10-CM | POA: Diagnosis not present

## 2018-10-20 DIAGNOSIS — Z8042 Family history of malignant neoplasm of prostate: Secondary | ICD-10-CM | POA: Insufficient documentation

## 2018-10-20 DIAGNOSIS — C5700 Malignant neoplasm of unspecified fallopian tube: Secondary | ICD-10-CM | POA: Insufficient documentation

## 2018-10-20 DIAGNOSIS — F419 Anxiety disorder, unspecified: Secondary | ICD-10-CM

## 2018-10-20 DIAGNOSIS — C482 Malignant neoplasm of peritoneum, unspecified: Secondary | ICD-10-CM

## 2018-10-20 DIAGNOSIS — I10 Essential (primary) hypertension: Secondary | ICD-10-CM | POA: Diagnosis not present

## 2018-10-20 DIAGNOSIS — Z79899 Other long term (current) drug therapy: Secondary | ICD-10-CM | POA: Diagnosis not present

## 2018-10-20 DIAGNOSIS — Z95828 Presence of other vascular implants and grafts: Secondary | ICD-10-CM | POA: Insufficient documentation

## 2018-10-20 DIAGNOSIS — Z7982 Long term (current) use of aspirin: Secondary | ICD-10-CM | POA: Insufficient documentation

## 2018-10-20 LAB — COMPREHENSIVE METABOLIC PANEL
ALT: 10 U/L (ref 0–44)
AST: 25 U/L (ref 15–41)
Albumin: 1.9 g/dL — ABNORMAL LOW (ref 3.5–5.0)
Alkaline Phosphatase: 62 U/L (ref 38–126)
Anion gap: 9 (ref 5–15)
BUN: 8 mg/dL (ref 8–23)
CO2: 26 mmol/L (ref 22–32)
Calcium: 7.8 mg/dL — ABNORMAL LOW (ref 8.9–10.3)
Chloride: 104 mmol/L (ref 98–111)
Creatinine, Ser: 0.77 mg/dL (ref 0.44–1.00)
GFR calc Af Amer: >60 mL/min (ref >60)
GFR calc non Af Amer: >60 mL/min (ref >60)
Glucose, Bld: 174 mg/dL — ABNORMAL HIGH (ref 70–99)
Potassium: 3.4 mmol/L — ABNORMAL LOW (ref 3.5–5.1)
Sodium: 139 mmol/L (ref 135–145)
Total Bilirubin: 0.3 mg/dL (ref 0.3–1.2)
Total Protein: 6.9 g/dL (ref 6.5–8.1)

## 2018-10-20 LAB — CBC WITH DIFFERENTIAL (CANCER CENTER ONLY)
Abs Immature Granulocytes: 0.04 10*3/uL (ref 0.00–0.07)
Basophils Absolute: 0 10*3/uL (ref 0.0–0.1)
Basophils Relative: 0 %
Eosinophils Absolute: 0.2 10*3/uL (ref 0.0–0.5)
Eosinophils Relative: 3 %
HCT: 23.3 % — ABNORMAL LOW (ref 36.0–46.0)
Hemoglobin: 7.4 g/dL — ABNORMAL LOW (ref 12.0–15.0)
Immature Granulocytes: 1 %
Lymphocytes Relative: 19 %
Lymphs Abs: 1.4 10*3/uL (ref 0.7–4.0)
MCH: 22.2 pg — ABNORMAL LOW (ref 26.0–34.0)
MCHC: 31.8 g/dL (ref 30.0–36.0)
MCV: 70 fL — ABNORMAL LOW (ref 80.0–100.0)
Monocytes Absolute: 0.8 10*3/uL (ref 0.1–1.0)
Monocytes Relative: 11 %
Neutro Abs: 5 10*3/uL (ref 1.7–7.7)
Neutrophils Relative %: 66 %
Platelet Count: 467 10*3/uL — ABNORMAL HIGH (ref 150–400)
RBC: 3.33 MIL/uL — ABNORMAL LOW (ref 3.87–5.11)
RDW: 23.3 % — ABNORMAL HIGH (ref 11.5–15.5)
WBC Count: 7.5 10*3/uL (ref 4.0–10.5)
nRBC: 0 % (ref 0.0–0.2)

## 2018-10-20 LAB — MAGNESIUM: Magnesium: 1.8 mg/dL (ref 1.7–2.4)

## 2018-10-20 MED ORDER — HEPARIN SOD (PORK) LOCK FLUSH 100 UNIT/ML IV SOLN
500.0000 [IU] | Freq: Once | INTRAVENOUS | Status: DC | PRN
  Filled 2018-10-20: qty 5

## 2018-10-20 MED ORDER — SODIUM CHLORIDE 0.9% FLUSH
10.0000 mL | INTRAVENOUS | Status: DC | PRN
  Filled 2018-10-20: qty 10

## 2018-10-20 MED ORDER — ESCITALOPRAM OXALATE 10 MG PO TABS: 10.0000 mg | ORAL_TABLET | Freq: Every day | ORAL | 1 refills | Status: DC

## 2018-10-20 NOTE — Patient Instructions (Signed)
You will be contacted about going to [facility] for evaluation with VIR due to leaking from the drain site.  Plan to follow up with Dr. Skeet Latch on May 28. Please call for any questions or concerns.  She would like for you to continue taking the Unasyn until the new antibiotics arrive.  Your goal is to gain weight, increase your diet/intake.

## 2018-10-20 NOTE — Progress Notes (Signed)
Left message for Threasa Beards, PT with Home Health, at [phone] giving verbal orders to extend PT.  Spoke with Threasa Beards with [facility] OT and gave verbal orders to extent OT for patient.

## 2018-10-20 NOTE — Progress Notes (Signed)
GYN ONCOLOGY OFFICE VISIT  Referring Clinician:  Dr. Kayleen Memos  CC:  Pelvic abscess  HPI:  HISTORY OF PRESENT ILLNESS:  [name]  is a lovely 73 y.o.  P3 with fallopian tube cancer.    Oncology History   BRCA2 positive Primary peritoneal, high grade serous     Primary peritoneal carcinomatosis ([place])   08/18/2017 Imaging    US imaging showed scular AAA measured 3.8 cm, liver nodularity with ascites and pleural effusions    09/03/2017 Imaging    CT chest elsewhere showed small bilateral pleural effusion    09/13/2017 Pathology Results    Fluid cytology positive for adenocarcinoma, strongly positive for CK7, CA-125, moderate to strong MOC-31 and B72.3. CK20, CEA and Ca19-9 were negative    09/16/2017 Imaging    CT abdomen and pelvis elsewhere showed small bilateral pleural effusion, cardiomegaly, 2.6 cm ovoid cystic mass at the pancreatic tail, 3.5 cm saccular aneurysm, no retroperitoneal lymphadenopathy, marked sigmoid wall thickening without overt mechanical bowel obstruction, marked omental thickening and ascites.    10/07/2017 Genetic Testing    BRCA2 c.5073dupA (p.Trp1692Metfs*3) pathogenic mutation and APC c.7088A>C (p.Lys2363Arg), POLE c.1007A>G (p.Asn36Ser) and RAD51D c.434G>A (p.Arg145His) VUS identified the [facility] panel.  The [facility] gene panel offered by [employer] includes sequencing and deletion/duplication testing of the following 35 genes: APC, ATM, AXIN2, BARD1, BMPR1A, BRCA1, BRCA2, BRIP1, CHD1, CDK4, CDKN2A, CHEK2, EPCAM (large rearrangement only), HOXB13, (sequencing only), GALNT12, MLH1, MSH2, MSH3 (excluding repetitive portions of exon 1), MSH6, MUTYH, NBN, NTHL1, PALB2, PMS2, PTEN, RAD51C, RAD51D, RNF43, RPS20, SMAD4, STK11, and TP53. Sequencing was performed for select regions of POLE and POLD1, and large rearrangement analysis was performed for select regions of GREM1. The report date is Oct 07, 2017.    10/08/2017 Procedure    Extensive  omental and peritoneal tumor is present anteriorly deep to the abdominal wall. From a midline approach, tumor was sampled just to the left of midline in the upper pelvis, below the level of the umbilicus. Solid tissue was obtained.  IMPRESSION: CT-guided core biopsy performed of omental/peritoneal tumor in the anterior peritoneal cavity.    10/08/2017 Pathology Results    Soft Tissue Needle Core Biopsy, Omentum/Peritoneal Cavity - POORLY DIFFERENTIATED CARCINOMA. Microscopic Comment Immunohistochemistry will be performed and reported as an addendum. Dr. [name] agrees. Called to [employer] on 10/11/17. (JDP:kh 10/11/17) ADDENDUM: Immunohistochemistry shows the tumor is positive with cytokeratin AE1/AE3, cytokeratin 7, estrogen receptor, WT-1, and p16. The tumor is negative with CD56, chromogranin, synaptophysin, CDX-2, cytokeratin 20, progesterone receptor, GATA-3, GCDFP, Napsin-A and thyroid transcription factor-1. 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 The catheter is ready for use.    10/14/2017 Procedure    A total of approximately 1300 mL of ascitic fluid was removed.    10/15/2017 Tumor Marker    Patient's tumor was tested for the following markers: CA-125 Results of the tumor marker test revealed 775.9    10/18/2017 -  Chemotherapy    The patient had carboplatin and Taxol    10/22/2017 - 10/27/2017 Hospital Admission    She was admitted to the hospital for evaluation  of shortness of breath and was found to have large pleural effusion, successfully removed and cytology was malignant.  Sepsis was ruled out.     10/23/2017 Imaging    No evidence of pulmonary embolism.  New moderate size left effusion and new small to moderate right effusion with associated bibasilar atelectasis.  Known extensive peritoneal carcinomatosis and moderate ascites.  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 Recommend correlation with prior exams and vascular surgery consultation.  2.1 cm left renal cyst.  Mild cardiomegaly and minimal atherosclerotic coronary artery disease.  Aortic Atherosclerosis (ICD10-I70.0) and Emphysema (ICD10-J43.9).    10/24/2017 Pathology Results    PLEURAL FLUID, LEFT (SPECIMEN 1 OF 1 COLLECTED 10/24/17): MALIGNANT CELLS CONSISTENT WITH METASTATIC ADENOCARCINOMA. SEE COMMENT.    10/24/2017 Procedure    Successful ultrasound guided left thoracentesis yielding 580 mL of pleural fluid.  Follow-up chest radiograph shows no pneumothorax    11/03/2017 Tumor Marker    Patient's tumor was tested for the following markers: CA-125 Results of the tumor marker test revealed 991.9    11/29/2017 Tumor Marker    Patient's tumor was tested for the following markers: CA-125 Results of the tumor marker test revealed 133    12/14/2017 Imaging    Decreased peritoneal carcinomatosis, and near complete resolution of ascites since prior exam.  No new or progressive disease within the abdomen or pelvis.  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  Skeet Latch, M.D. 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  Miliary disease of the bilateral diaphragmatic surfaces and also the mesentery of the large and small bowel.  Left adnexa densely adherent to the distal rectosigmoid colon.  Right adnexa adherent to the pelvic sidewall.  Multiple loops of bowel adherent to miliary disease in the vaginal cuff.  At the completion of procedure the patient was R1 disease status     01/27/2018 Pathology Results    1. Omentum, resection for tumor, infragastric - HIGH GRADE SEROUS CARCINOMA, >2 CM. - SEE ONCOLOGY TABLE. 2. Soft tissue mass, simple excision, abdominal wall nodule - HIGH GRADE SEROUS CARCINOMA, >2 CM. - SEE ONCOLOGY TABLE. 3. Ovary and fallopian tube, left - OVARY AND FALLOPIAN TUBE WITH SURFACE SEROUS CARCINOMA. 4. Ovary and fallopian tube, right - OVARY AND FALLOPIAN TUBE WITH SURFACE SEROUS CARCINOMA. Microscopic Comment 1. OVARY or FALLOPIAN TUBE or PRIMARY PERITONEUM: Procedure: Omentectomy, soft tissue excision, bilateral salpingo-oophorectomy. Specimen Integrity: Intact. Tumor Site: See comment. Ovarian Surface Involvement (required only if applicable): Present, bilateral. Fallopian Tube Surface Involvement (required only if applicable): Present, bilateral. Tumor Size: >2 cm, see comment. Histologic Type: High grade serous carcinoma. Histologic Grade: High grade. Implants (  required for advanced stage serous/seromucinous borderline tumors only): See comment. Other Tissue/ Organ Involvement: Bilateral fallopian  tubes, bilateral ovaries, omentum, peritoneum. Largest Extrapelvic Peritoneal Focus (required only if applicable): >2 cm. Peritoneal/Ascitic Fluid: N/A. Pleural effusion ([id number]) positive. Treatment Effect (required only for high-grade serous carcinomas): CRS-1 Regional Lymph Nodes: No lymph nodes submitted or found Pathologic Stage Classification (pTNM, AJCC 8th Edition): ypT3c, ypNX, pM1a Representative Tumor Block: 1A-B Comment(s): The omentum is largely infiltrated by tumor (>2 cm). Both ovaries and fallopian tubes were entirely submitted. A distinct precursor lesion is not identified, only surface deposits. While it is not recommended to diagnose a primary peritoneal carcinoma after treatment, it is assumed the likely primary given the lack of precursor lesion or significant disease in the ovaries and fallopian tubes.    01/27/2018 - 03/23/2018 Hospital Admission    She was admitted to the hospital for interval debulking surgery, complicated by sepsis and respiratory failure.    03/08/2018 Imaging    7.0 x 1.3 cm left pelvic fluid collection is noted with air-fluid level and enhancing margins, most consistent with abscess. It is slightly smaller compared to prior exam.  Stable position of surgical drainage catheter is seen in the pelvis.  Sigmoid diverticulosis is noted without inflammation.  3.9 cm aortic aneurysm is noted at aortic hiatus. Recommend followup by [name] in 2 years. This recommendation follows ACR consensus guidelines: White Paper of the ACR Incidental Findings Committee II on Vascular Findings. Joellyn Rued Radiol 2013; [phone].     03/29/2018 Tumor Marker    Patient's tumor was tested for the following markers: CA-125 Results of the tumor marker test revealed 35.3    04/07/2018 Imaging    CT abdomen and pelvis Persistent rim enhancing fluid and gas collection in the left pelvic sidewall measuring 7.2 x 3.0 cm, and not significantly changed compared to prior study.  Surgical drain remains in place in the central pelvis.  Sigmoid colon diverticulosis, with muscular hypertrophy versus mild diverticulitis.  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 Consider vascular surgery referral/consultation if not already obtained.    04/18/2018 Imaging    CT-guided placement of a drainage catheter within the left pelvic sidewall collection. The collection contains mostly gas and only 1 mL bloody fluid could be removed. The fluid was sent for culture.  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  Will schedule for follow-up in the IR clinic in 1 week with a CT and drain injection.    04/26/2018 Imaging    CT abdomen and pelvis 1. Left pelvic sidewall abscess collection is decompressed with the percutaneous drain. No residual fluid. There continues to be soft tissue thickening and small pockets of gas in this area. Findings concerning for a residual colonic fistula tract but this will be further characterized with drain injection with fluoroscopy. No new abscess or fluid collections. 2. Persistent gas in the left pelvis related to the old surgical drain site. However, this gas-filled cavity is decreasing in size compared to 04/18/2018. 3. 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 Aneurysm measures up to 3.8 cm. Aortic aneurysm NOS (ICD10-I71.9). Aortic Atherosclerosis (ICD10-I70.0).  4. 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    04/26/2018 Procedure    Positive for a fistula tract between the percutaneous drain and the sigmoid colon.  No significant abscess cavity.     05/10/2018 Tumor Marker    Patient's tumor was tested for the following markers: CA-125  Results of the tumor marker test revealed 18.8    05/10/2018 Imaging    1. Left pelvic sidewall abscess collection remains decompressed with the percutaneous drain. No residual fluid collection. There continues to be  a small amount of gas adjacent to the drain and likely related to a fistula tract. Patient is scheduled for a drain injection. 2. No new abscess or fluid collections. Again noted is trace perisplenic fluid. 3. 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 Aneurysm measures up to 3.8 cm. Aortic aneurysm NOS (ICD10-I71.9). Aortic Atherosclerosis (ICD10-I70.0).    05/10/2018 Procedure    Persistent fistula tract between the decompressed abscess cavity and the sigmoid colon. The fistula tract remains widely patent but smaller than it was on 04/26/2018.  The drain was kept in place. Instructed the patient to flush the tube every other day. Patient requested a suction bulb but we instructed the patient NOT to use suction on this bulb. Patient will follow-up for a drain injection in approximately 2 weeks.    06/10/2018 Tumor Marker    Patient's tumor was tested for the following markers: CA-125 Results of the tumor marker test revealed 19.4    06/30/2018 Imaging    1. New small RIGHT pleural effusion and nonspecific retroperitoneal/mesenteric edema. 2. LEFT pelvic drainage catheter again noted - no evidence of residual abscess. 3. 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 4. Cardiomegaly. 5.  Aortic Atherosclerosis (ICD10-I70.0).    06/30/2018 - 07/07/2018 Hospital Admission    She was admitted due to weakness and was found to have bacteremia    07/02/2018 Procedure    Status post removal of right IJ port catheter.    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  This required intervention by vascular surgery.       Jo Parker was admitted in 07/2018 for a mycotic aneurysm that required vascular surgical procedure. Has a persistent low output fistula, There has been minimal output, and  several attempts to withdraw the drain and seal the fistula with fibrin glue have been unsuccessful.  An attempt was made to slowly withdraw the drain to see if this would slowly aid in resolution of the small persistent output.    An attempt was made last week with the development of an abscess.  Jo Parker was stated on axoicillin with clavulanic acid  and the drain was replaced.  Since replacement Jo Parker reports no fever but a sense of poor well being, poor appetite associated with medication, and low mood.  She was admitted to [facility] for VIR drainage 10/09/2018.  Was discharged home on unasyn.  Pelvic abscess cultures are resistant to unasyn. Discussed with Dr. Noreene Filbert (ID) recommend cefepime/Flagy IV until abscess is smaller.  Will then heve the option of lifelong augmentin or levaquin.  Afebrile. 75cc output per day.    Current Meds:  Outpatient Encounter Medications as of 10/20/2018  Medication Sig  . acetaminophen (TYLENOL) 500 MG tablet Take 500 mg by mouth every 6 (six) hours as needed for mild pain or headache.  Marland Kitchen aspirin 81 MG chewable tablet Chew 81 mg by mouth daily.  . clopidogrel (PLAVIX) 75 MG tablet Take 75 mg by mouth daily.  . feeding supplement (BOOST HIGH PROTEIN) LIQD Take 1 Container by mouth 2 (two) times daily between meals.  Marland Kitchen  feeding supplement, ENSURE ENLIVE, (ENSURE ENLIVE) LIQD Take 237 mLs by mouth 2 (two) times daily between meals. (Patient not taking: Reported on 10/03/2018)  . fluconazole (DIFLUCAN) 200 MG tablet Take 1 tablet (200 mg total) by mouth daily.  . magnesium oxide (MAG-OX) 400 MG tablet Take 400 mg by mouth 2 (two) times daily.   . megestrol (MEGACE) 400 MG/10ML suspension Take 10 mLs (400 mg total) by mouth daily. (Patient not taking: Reported on 10/03/2018)  . mirtazapine (REMERON) 30 MG tablet Take 1 tablet (30 mg total) by mouth at bedtime. (Patient taking differently: Take 15 mg by mouth at bedtime. )  . Pediatric Multiple Vit-C-FA  (MULTIVITAMIN ANIMAL SHAPES, WITH CA/FA,) with C & FA chewable tablet Chew 1 tablet by mouth daily.  . sodium chloride 0.9 % injection Inject 10 mLs into the vein daily. Inject 10 mL into left pelvic drain once daily  . traMADol (ULTRAM) 50 MG tablet Take 1 tablet (50 mg total) by mouth every 6 (six) hours as needed for moderate pain.   No facility-administered encounter medications on file as of 10/20/2018.     Allergy:  Allergies  Allergen Reactions  . Oxycodone Other (See Comments)    confusion    Social Hx:   Social History   Socioeconomic History  . Marital status: Married    Spouse name: Not on file  . Number of children: 3  . Years of education: Not on file  . Highest education level: Not on file  Occupational History  . Occupation: retired  Scientific laboratory technician  . Financial resource strain: Not on file  . Food insecurity:    Worry: Not on file    Inability: Not on file  . Transportation needs:    Medical: Not on file    Non-medical: Not on file  Tobacco Use  . Smoking status: Former Smoker    Packs/day: 1.00    Years: 55.00    Pack years: 55.00    Last attempt to quit: 10/06/2017    Years since quitting: 1.0  . Smokeless tobacco: Never Used  Substance and Sexual Activity  . Alcohol use: Never    Frequency: Never  . Drug use: Never  . Sexual activity: Not Currently  Lifestyle  . Physical activity:    Days per week: Not on file    Minutes per session: Not on file  . Stress: Not on file  Relationships  . Social connections:    Talks on phone: Not on file    Gets together: Not on file    Attends religious service: Not on file    Active member of club or organization: Not on file    Attends meetings of clubs or organizations: Not on file    Relationship status: Not on file  . Intimate partner violence:    Fear of current or ex partner: Not on file    Emotionally abused: Not on file    Physically abused: Not on file    Forced sexual activity: Not on file  Other  Topics Concern  . Not on file  Social History Narrative  . Not on file    Past Surgical Hx:  Past Surgical History:  Procedure Laterality Date  . ABDOMINAL HYSTERECTOMY    . cardiac monitor implant    . CEREBRAL ANEURYSM REPAIR    . CERVICAL SPINE SURGERY     bone spurs  . DEBULKING N/A 01/27/2018   Procedure: RADICAL DEBULKING;  Surgeon: [name], MD;  Location:  [facility];  Service: Gynecology;  Laterality: N/A;  . IR CATHETER TUBE CHANGE  06/13/2018  . IR CATHETER TUBE CHANGE  09/28/2018  . IR CATHETER TUBE CHANGE  10/04/2018  . IR FLUORO GUIDE PORT INSERTION RIGHT  10/14/2017  . IR PARACENTESIS  10/14/2017  . IR RADIOLOGIST EVAL & MGMT  04/26/2018  . IR RADIOLOGIST EVAL & MGMT  05/10/2018  . IR RADIOLOGIST EVAL & MGMT  05/25/2018  . IR RADIOLOGIST EVAL & MGMT  07/20/2018  . IR REMOVAL TUN ACCESS W/ PORT W/O FL MOD SED  07/02/2018  . IR SINUS/FIST TUBE CHK-NON GI  10/04/2018  . IR US GUIDE VASC ACCESS RIGHT  10/14/2017  . LAPAROTOMY N/A 01/27/2018   Procedure: EXPLORATORY LAPAROTOMY WITH REMOVAL OF BILATERAL TUBES AND OVARIES;  Surgeon: [name], MD;  Location: [facility];  Service: Gynecology;  Laterality: N/A;  . LAPAROTOMY N/A 01/31/2018   Procedure: EXPLORATORY LAPAROTOMY, ABDOMINAL WASHOUT;  Surgeon: Isabel Caprice, MD;  Location: [facility];  Service: Gynecology;  Laterality: N/A;  . OMENTECTOMY N/A 01/27/2018   Procedure: OMENTECTOMY;  Surgeon: [name], MD;  Location: [facility];  Service: Gynecology;  Laterality: N/A;  . PARTIAL HYSTERECTOMY  1971   "precancer"  . TEE WITHOUT CARDIOVERSION N/A 07/04/2018   Procedure: TRANSESOPHAGEAL ECHOCARDIOGRAM (TEE);  Surgeon: Sanda Klein, MD;  Location: [facility];  Service: Cardiovascular;  Laterality: N/A;    Past Medical Hx:  Past Medical History:  Diagnosis Date  . Acute on chronic respiratory failure with hypoxia ([place]) 03/07/2018  . Anxiety and depression   . Aortic aneurysm ([place])    see ov note 03/2017   . Arthritis   .  Aspiration pneumonia ([place])   . Carcinomatosis ([place]) 10/2017  . CHF (congestive heart failure) ([name])    diastolic dysfunction  . COPD (chronic obstructive pulmonary disease) ([place]) 10/23/2017  . Depression   . Family history of breast cancer   . Family history of prostate cancer   . Family history of uterine cancer   . History of diverticulitis   . HLD (hyperlipidemia) 10/23/2017  . Hypertension   . Mitral valve prolapse   . Pelvic abscess in female   . Sleep apnea    cpap machine mask and tubing in [place] per patient.  Patient does not have mask or tubing to bring on DOS of 01/27/2018.   Marland Kitchen Uterine cancer ([place])    had partial hysterectomy at 27    Past Gynecological History:   GYNECOLOGIC HISTORY:  No LMP recorded. Patient has had a hysterectomy.  For precancer Menarche: 72 years old P 3 LMP 1977 Contraceptive OCP x8 years HRT none  Last Pap N/A  Family Hx:  Family History  Problem Relation Age of Onset  . Endometrial cancer Sister 71       endometrial ca  . Endometrial cancer Paternal Aunt 80  . Breast cancer Paternal Aunt   . Stomach cancer Maternal Grandfather        d. 67s  . Prostate cancer Paternal Uncle   . Breast cancer Cousin        pat first cousin  . Breast cancer Cousin        pat first cousin's daughter  . Lung cancer Father        lung ca  . Heart disease Mother   . Stroke Paternal Grandmother   . Endometrial cancer Cousin        paternal first cousin's daughter  . Breast cancer Other  niece dx less than 56s  . Breast cancer Other        niece, dx under 78    Review of Systems:  Review of Systems  Constitutional: Positive for appetite change. Negative for chills and fatigue.       Fair appetite  HENT:   Negative for tinnitus and trouble swallowing.   Eyes: Negative.   Respiratory: Negative for chest tightness, cough, shortness of breath and wheezing.   Cardiovascular: Negative for chest pain and leg swelling.  Gastrointestinal:  Negative for abdominal distention, abdominal pain, constipation, diarrhea, nausea, rectal pain and vomiting.       Drain intermittently clogs at the three way stopcock.   Endocrine: Negative.   Genitourinary: Negative for difficulty urinating, dysuria, frequency, hematuria, pelvic pain, vaginal bleeding and vaginal discharge.   Musculoskeletal: Negative for back pain.  Skin: Negative.   Neurological: Negative.   Hematological: Negative.   Psychiatric/Behavioral: Positive for depression. Negative for confusion. The patient is not nervous/anxious.    Physical Exam: BP (!) 110/44 (BP Location: Left Arm, Patient Position: Sitting)   Pulse 85   Temp 98.7 F (37.1 C) (Oral)   Resp 17   Ht _0  (1.6 m)   SpO2 99%   BMI 18.35 kg/m  Weight 95.2 pounds per patient report General :   73 y.o., female in no apparent distress, frail appearing. HEENT:  Normocephalic/atraumatic, symmetric, EOMI, eyelids normal Neck:    Abdomen: drainage of purulent material from the skin.  Three way stopcock removed. To allow for better flow.    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 Mrs. Yannuzzi  completed 4 cycles of paclitaxel carboplatin with reduction in Ca1 25-133.  December 20, 2017.  Underwent robotic assisted bilateral salpingo-oophorectomy omentectomy interval debulking on January 27, 2018.  Postop course complicated by sepsis with suspicion of perforation.  She underwent an exploratory laparotomy however the microperforation could not be identified.   A drain remains in place for management of the pelvic abscess Jo Parker is not a candidate for surgical intervention.  Appetite has improved.  Antibiotics changed to cefepime/Flagyl IV.  Vir will reassess abscess on May 26. 2020 Three way stopcock removed to permit better flow from the abscess. 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   WJanie Morning MD   Cc: WToney Reil MD, [place]

## 2018-10-26 ENCOUNTER — Telehealth: Payer: Self-pay | Admitting: General Practice

## 2018-10-26 NOTE — Telephone Encounter (Signed)
Scheduled pt for NP this Friday 5/22. Forgot to ask prior. OK to keep or reschedule?

## 2018-10-26 NOTE — Telephone Encounter (Signed)
Keep, can come in to clinic.

## 2018-10-28 ENCOUNTER — Encounter: Payer: Self-pay | Admitting: Family Medicine

## 2018-10-28 ENCOUNTER — Ambulatory Visit (INDEPENDENT_AMBULATORY_CARE_PROVIDER_SITE_OTHER): Payer: Medicare PPO | Admitting: Family Medicine

## 2018-10-28 ENCOUNTER — Other Ambulatory Visit: Payer: Self-pay

## 2018-10-28 VITALS — BP 118/74 | HR 94 | Temp 98.8°F | Ht 63.0 in | Wt 105.2 lb

## 2018-10-28 DIAGNOSIS — M7989 Other specified soft tissue disorders: Secondary | ICD-10-CM

## 2018-10-28 DIAGNOSIS — I10 Essential (primary) hypertension: Secondary | ICD-10-CM | POA: Diagnosis not present

## 2018-10-28 DIAGNOSIS — R197 Diarrhea, unspecified: Secondary | ICD-10-CM

## 2018-10-28 DIAGNOSIS — F321 Major depressive disorder, single episode, moderate: Secondary | ICD-10-CM

## 2018-10-28 DIAGNOSIS — D508 Other iron deficiency anemias: Secondary | ICD-10-CM

## 2018-10-28 LAB — CBC WITH DIFFERENTIAL/PLATELET
Basophils Absolute: 0 10*3/uL (ref 0.0–0.1)
Basophils Relative: 0.4 % (ref 0.0–3.0)
Eosinophils Absolute: 0.4 10*3/uL (ref 0.0–0.7)
Eosinophils Relative: 5.2 % — ABNORMAL HIGH (ref 0.0–5.0)
HCT: 28 % — ABNORMAL LOW (ref 36.0–46.0)
Hemoglobin: 9.2 g/dL — ABNORMAL LOW (ref 12.0–15.0)
Lymphocytes Relative: 26.9 % (ref 12.0–46.0)
Lymphs Abs: 2 10*3/uL (ref 0.7–4.0)
MCHC: 32.8 g/dL (ref 30.0–36.0)
MCV: 68.6 fl — ABNORMAL LOW (ref 78.0–100.0)
Monocytes Absolute: 0.9 10*3/uL (ref 0.1–1.0)
Monocytes Relative: 11.8 % (ref 3.0–12.0)
Neutro Abs: 4.1 10*3/uL (ref 1.4–7.7)
Neutrophils Relative %: 55.7 % (ref 43.0–77.0)
Platelets: 513 10*3/uL — ABNORMAL HIGH (ref 150.0–400.0)
RBC: 4.08 Mil/uL (ref 3.87–5.11)
RDW: 25.8 % — ABNORMAL HIGH (ref 11.5–15.5)
WBC: 7.4 10*3/uL (ref 4.0–10.5)

## 2018-10-28 LAB — COMPREHENSIVE METABOLIC PANEL
ALT: 7 U/L (ref 0–35)
AST: 16 U/L (ref 0–37)
Albumin: 2.8 g/dL — ABNORMAL LOW (ref 3.5–5.2)
Alkaline Phosphatase: 67 U/L (ref 39–117)
BUN: 11 mg/dL (ref 6–23)
CO2: 24 mEq/L (ref 19–32)
Calcium: 8.8 mg/dL (ref 8.4–10.5)
Chloride: 105 mEq/L (ref 96–112)
Creatinine, Ser: 0.61 mg/dL (ref 0.40–1.20)
GFR: 116.14 mL/min (ref >60.00)
Glucose, Bld: 79 mg/dL (ref 70–99)
Potassium: 3.2 mEq/L — ABNORMAL LOW (ref 3.5–5.1)
Sodium: 139 mEq/L (ref 135–145)
Total Bilirubin: 0.3 mg/dL (ref 0.2–1.2)
Total Protein: 8 g/dL (ref 6.0–8.3)

## 2018-10-28 LAB — BRAIN NATRIURETIC PEPTIDE: Pro B Natriuretic peptide (BNP): 207 pg/mL — ABNORMAL HIGH (ref 0.0–100.0)

## 2018-10-28 MED ORDER — CARVEDILOL 25 MG PO TABS: 25.0000 mg | ORAL_TABLET | Freq: Two times a day (BID) | ORAL | 3 refills | Status: AC

## 2018-10-28 MED ORDER — SERTRALINE HCL 25 MG PO TABS: 25.0000 mg | ORAL_TABLET | Freq: Every day | ORAL | 1 refills | Status: AC

## 2018-10-28 MED ORDER — DIPHENOXYLATE-ATROPINE 2.5-0.025 MG PO TABS: 1.0000 | ORAL_TABLET | Freq: Four times a day (QID) | ORAL | 1 refills | Status: AC | PRN

## 2018-10-28 NOTE — Patient Instructions (Addendum)
As far as your depression goes: we are going to do zoloft. Best studies with geriatric population. We are going to start you off at 25mg /once a day. Would like for you to follow up in 1 month. Can see Dr. Jonni Sanger or another  Provider. Can cause some GI upset, nausea. Would just watch her closely. Also takes about 3-4 weeks to get into a steady state.   Checking labs today for leg swelling....   Have some concerns because your hemoglobin was 7.4 on last check 10/20/18. Checking again today. If critical will get a call and will need to go to hospital or call [facility].   Diarrhea: sent in lomotil. Just need to watch b/c can cause confusion, GI upset.   Refilled her coreg for her.

## 2018-10-28 NOTE — Progress Notes (Signed)
Patient: Jo Parker MRN: [id number] DOB: [birth year] PCP: Patient, [name] Per     Subjective:  Chief Complaint  Patient presents with  . Establish Care  . Labs Only    HPI: The patient is a 73 y.o. female who presents today for establishing care. 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 She had a development of an abscess in may and was started on antibiotics.  Admitted to [facility] for VIR drainage 10/09/2018 and discharged on unasyn. Due to resistance she is currently on IV cefepime and flagyl. Will be on this until June 8th then lifelong augmentin.   Hypertension: Here for follow up of hypertension.  Currently on Coreg BID.  Home readings range from 742-595 GLOVFIEP/32 diastolic. Takes medication as prescribed and denies any side effects. Exercise includes none. Weight has been stable. Denies any chest pain, headaches, shortness of breath, vision changes, swelling in lower extremities.   Depression: She has no history of depression, but states this all started about 1 year ago when all of the cancer stuff began. Her son moved her from her home to [place] and she is very sad about this. She is still living with her son. Dr. Skeet Latch started her on lexapro at her last visit. She has not picked this up yet. She also decreased dose down to 15mg . She has thoughts of being better off dead, but it's because she is hurting and she has no plan. She also has had a lot of close friends pass away.   Bilateral foot swelling: Son and patient just noticed this a few days ago. Both feet, but left is a little more swollen. Ankles not affected and  it's not painful. Does not wear compression hose or elevate legs much. She has not had any cough or dyspnea. No orthopnea. No recent labs with renal/liver issues. She is quite cachetic and likely malnourished. Albumin has been very low.   Diarrhea secondary to antibiotic: has tried imoidium, pepto bismol. Just received message from Dr. Skeet Latch and stated they can try lomotil. Is asking if I can fill this for them. Has had this since starting cefepime. No blood in stool and abdominal pain has been the same since they put a different kind of drain in for her abscess. Denies any fever/chills.    Review of Systems  Constitutional: Negative for fatigue.  HENT: Negative for ear pain, sore throat and trouble swallowing.   Eyes: Negative for visual disturbance.  Respiratory: Negative for shortness of breath.   Cardiovascular: Negative for chest pain.  Gastrointestinal: Positive for abdominal pain and diarrhea. Negative for nausea.  Genitourinary: Negative for dysuria, pelvic pain and vaginal pain.  Musculoskeletal: Negative for back pain and neck pain.  Skin: Negative.   Neurological: Negative for dizziness and headaches.  Psychiatric/Behavioral: Negative for sleep disturbance.    Allergies Patient is allergic to oxycodone.  Past Medical History Patient  has a past medical history of Acute on chronic respiratory failure with hypoxia ([place]) (03/07/2018), Anxiety and depression, Aortic aneurysm ([place]), Arthritis, Aspiration pneumonia ([place]), Carcinomatosis ([place]) (10/2017), CHF (congestive heart failure) ([place]), COPD (chronic obstructive pulmonary disease) ([place]) (10/23/2017), Depression, Family history of breast cancer, Family history of prostate cancer, Family  history of uterine cancer, History of diverticulitis, HLD (hyperlipidemia) (10/23/2017), Hypertension, Mitral valve prolapse, Pelvic abscess in female, Sleep apnea, and Uterine cancer ([place]).  Surgical History Patient  has a past surgical history that  includes Cervical spine surgery; Cerebral aneurysm repair; Partial hysterectomy (1971); cardiac monitor implant; Abdominal hysterectomy; IR US Guide Vasc Access Right (10/14/2017); IR FLUORO GUIDE PORT INSERTION RIGHT (10/14/2017); IR Paracentesis (10/14/2017); Omentectomy (N/A, 01/27/2018); Debulking (N/A, 01/27/2018); laparotomy (N/A, 01/27/2018); laparotomy (N/A, 01/31/2018); IR Radiologist Eval & Mgmt (04/26/2018); IR Radiologist Eval & Mgmt (05/10/2018); IR Radiologist Eval & Mgmt (05/25/2018); IR Catheter Tube Change (06/13/2018); IR REMOVAL TUN ACCESS W/ PORT W/O FL MOD SED (07/02/2018); TEE without cardioversion (N/A, 07/04/2018); IR Radiologist Eval & Mgmt (07/20/2018); IR Catheter Tube Change (09/28/2018); IR Catheter Tube Change (10/04/2018); and IR Sinus/Fist Tube Chk-Non GI (10/04/2018).  Family History Pateint's family history includes Breast cancer in her cousin, cousin, paternal aunt, and other family members; Endometrial cancer in her cousin; Endometrial cancer (age of onset: 56) in her sister; Endometrial cancer (age of onset: 54) in her paternal aunt; Heart disease in her mother; Lung cancer in her father; Prostate cancer in her paternal uncle; Stomach cancer in her maternal grandfather; Stroke in her paternal grandmother.  Social History Patient  reports that she quit smoking about 12 months ago. She has a 55.00 pack-year smoking history. She has never used smokeless tobacco. She reports that she does not drink alcohol or use drugs.    Objective: Vitals:   10/28/18 1313  BP: 118/74  Pulse: 94  Temp: 98.8 F (37.1 C)  TempSrc: Temporal  SpO2: 96%  Weight: 105 lb 3.2 oz (47.7 kg)  Height: 5\' 3"  (1.6 m)    Body mass index is 18.64 kg/m.  Physical Exam Vitals signs reviewed.  Constitutional:      Appearance: She is ill-appearing (bent over due to stomach pain ).     Comments: Cachetic, frail. In [place]   HENT:     Head: Normocephalic and atraumatic.     Nose: Nose normal.  Neck:      Musculoskeletal: Normal range of motion and neck supple.  Cardiovascular:     Rate and Rhythm: Normal rate and regular rhythm.     Heart sounds: Normal heart sounds.     Comments: Bilateral foot edema, pitting 2+. Does not go to ankle. Pedal pulses intact Pulmonary:     Effort: Pulmonary effort is normal.     Breath sounds: Normal breath sounds.  Abdominal:     General: Abdomen is flat.     Tenderness: There is abdominal tenderness (around drain. no drainage. ).     Comments: Minimal output in fistula.   Skin:    General: Skin is warm.     Capillary Refill: Capillary refill takes less than 2 seconds.  Neurological:     General: No focal deficit present.     Mental Status: She is alert and oriented to person, place, and time.  Psychiatric:        Mood and Affect: Mood normal.        Behavior: Behavior normal.     Comments: No si/hi/ah/vh       Office Visit from 10/28/2018 in [place]  PHQ-9 Total Score  16        Assessment/plan: 1. Essential hypertension To goal. Continue medication. Refills given.  - carvedilol (COREG) 25 MG tablet; Take 1 tablet (25 mg total) by mouth 2 (two) times a day.  Dispense:  180 tablet; Refill: 3  2. Current moderate episode of major depressive disorder without prior episode ([place]) Never started her lexapro. WE are going to do zoloft instead with better studies with geriatric population. Low dose at 25mg . Side effects discussed, especially in regards to GI issues. Discussed can take up to 4 weeks to reach steady state. Does have si, but no plan and has no history. Discussed with son and patient if increased SI/plan they need to go to ER or call 911. Also discussed may make her nauseated first few days, but usually passes. My main concern is the diarrhea. Will be done with antibiotics on June 8th. Will f/u with her in one month to re evaluate. She may benefit from counseling as well.   3. Leg swelling NO hx of CHF/liver/kidney  failure. Will check labs again today. Does have very low albumin and could be contributing to edema due to protein gradient issues. Trial of compression hose, leg elevation and continue to increase weight/protein intake. Precautions given.  - CBC with Differential/Platelet - Brain natriuretic peptide - Comprehensive metabolic panel  4. Diarrhea, unspecified type Secondary to IV antibiotics. Message seen by Dr. Skeet Latch. Will do trial of lomotil, but discussed with son in elderly they have increased risk of confusion/fall/etc. Watch closely. If any issues please stop and let us know. Dehydration precautions also given. Checking labs today.   5. Iron deficiency anemia -last cbc was 7.4. hx of transfusion on 5/4 with hgb of 6.7. she is fatigued and feels like it could be low. Likely hemoconcentrated as well due to diarrhea. ER precautions given and discussed if critical they will get a call and need to go to ER.    Spent over 45 minutes reviewing records, labs and past specialists notes. >50% of time spent in counseling/reviewing history.   -looking at chart: not utd on her immunizations. No pneumonia shots that I can tell. No tdap. Hep C screen at next lab draw. ?mmg/cscope. Did not have time to address all of this today.   Return in about 1 month (around 11/28/2018) for depression f/u .   Orma Flaming, MD [place]   10/28/2018

## 2018-11-01 ENCOUNTER — Ambulatory Visit (HOSPITAL_COMMUNITY): Admission: RE | Admit: 2018-11-01 | Payer: Medicare PPO | Source: Ambulatory Visit

## 2018-11-01 ENCOUNTER — Ambulatory Visit: Payer: Medicare PPO | Admitting: Internal Medicine

## 2018-11-01 ENCOUNTER — Telehealth: Payer: Self-pay | Admitting: Oncology

## 2018-11-01 NOTE — Telephone Encounter (Signed)
Called [name] about CT cancellation for today.  He said they were not sure if they should keep the appointment since she saw IR at [facility] on 10/25/18.  Discussed with [name], NP and advised him to keep the follow up with Dr. Skeet Latch on 11/03/18 and if needed, we can reschedule the CT.  He verbalized agreement.

## 2018-11-02 ENCOUNTER — Other Ambulatory Visit: Payer: Self-pay | Admitting: Family Medicine

## 2018-11-02 ENCOUNTER — Other Ambulatory Visit: Payer: Self-pay | Admitting: Gynecologic Oncology

## 2018-11-02 DIAGNOSIS — C569 Malignant neoplasm of unspecified ovary: Secondary | ICD-10-CM

## 2018-11-02 DIAGNOSIS — E440 Moderate protein-calorie malnutrition: Secondary | ICD-10-CM

## 2018-11-02 NOTE — Progress Notes (Signed)
Labs ordered prior to appt

## 2018-11-03 ENCOUNTER — Inpatient Hospital Stay: Payer: Medicare PPO

## 2018-11-03 ENCOUNTER — Other Ambulatory Visit: Payer: Self-pay

## 2018-11-03 ENCOUNTER — Inpatient Hospital Stay (HOSPITAL_BASED_OUTPATIENT_CLINIC_OR_DEPARTMENT_OTHER): Payer: Medicare PPO | Admitting: Gynecologic Oncology

## 2018-11-03 VITALS — BP 110/61 | HR 90 | Temp 98.5°F | Resp 17 | Ht 63.0 in | Wt 101.8 lb

## 2018-11-03 DIAGNOSIS — C5700 Malignant neoplasm of unspecified fallopian tube: Secondary | ICD-10-CM

## 2018-11-03 DIAGNOSIS — E876 Hypokalemia: Secondary | ICD-10-CM

## 2018-11-03 DIAGNOSIS — R110 Nausea: Secondary | ICD-10-CM

## 2018-11-03 DIAGNOSIS — Z7982 Long term (current) use of aspirin: Secondary | ICD-10-CM

## 2018-11-03 DIAGNOSIS — Z8042 Family history of malignant neoplasm of prostate: Secondary | ICD-10-CM

## 2018-11-03 DIAGNOSIS — R52 Pain, unspecified: Secondary | ICD-10-CM

## 2018-11-03 DIAGNOSIS — Z87891 Personal history of nicotine dependence: Secondary | ICD-10-CM

## 2018-11-03 DIAGNOSIS — C569 Malignant neoplasm of unspecified ovary: Secondary | ICD-10-CM

## 2018-11-03 DIAGNOSIS — E440 Moderate protein-calorie malnutrition: Secondary | ICD-10-CM

## 2018-11-03 DIAGNOSIS — F419 Anxiety disorder, unspecified: Secondary | ICD-10-CM

## 2018-11-03 DIAGNOSIS — C482 Malignant neoplasm of peritoneum, unspecified: Secondary | ICD-10-CM

## 2018-11-03 DIAGNOSIS — C786 Secondary malignant neoplasm of retroperitoneum and peritoneum: Secondary | ICD-10-CM

## 2018-11-03 DIAGNOSIS — D649 Anemia, unspecified: Secondary | ICD-10-CM

## 2018-11-03 DIAGNOSIS — Z803 Family history of malignant neoplasm of breast: Secondary | ICD-10-CM

## 2018-11-03 DIAGNOSIS — F329 Major depressive disorder, single episode, unspecified: Secondary | ICD-10-CM

## 2018-11-03 DIAGNOSIS — Z8049 Family history of malignant neoplasm of other genital organs: Secondary | ICD-10-CM

## 2018-11-03 DIAGNOSIS — I10 Essential (primary) hypertension: Secondary | ICD-10-CM

## 2018-11-03 DIAGNOSIS — Z79899 Other long term (current) drug therapy: Secondary | ICD-10-CM

## 2018-11-03 DIAGNOSIS — Z95828 Presence of other vascular implants and grafts: Secondary | ICD-10-CM

## 2018-11-03 LAB — CBC WITH DIFFERENTIAL (CANCER CENTER ONLY)
Abs Immature Granulocytes: 0.11 10*3/uL — ABNORMAL HIGH (ref 0.00–0.07)
Basophils Absolute: 0 10*3/uL (ref 0.0–0.1)
Basophils Relative: 0 %
Eosinophils Absolute: 0.2 10*3/uL (ref 0.0–0.5)
Eosinophils Relative: 2 %
HCT: 28.2 % — ABNORMAL LOW (ref 36.0–46.0)
Hemoglobin: 8.9 g/dL — ABNORMAL LOW (ref 12.0–15.0)
Immature Granulocytes: 1 %
Lymphocytes Relative: 22 %
Lymphs Abs: 2.2 10*3/uL (ref 0.7–4.0)
MCH: 21.6 pg — ABNORMAL LOW (ref 26.0–34.0)
MCHC: 31.6 g/dL (ref 30.0–36.0)
MCV: 68.4 fL — ABNORMAL LOW (ref 80.0–100.0)
Monocytes Absolute: 0.8 10*3/uL (ref 0.1–1.0)
Monocytes Relative: 8 %
Neutro Abs: 6.7 10*3/uL (ref 1.7–7.7)
Neutrophils Relative %: 67 %
Platelet Count: 549 10*3/uL — ABNORMAL HIGH (ref 150–400)
RBC: 4.12 MIL/uL (ref 3.87–5.11)
RDW: 25.5 % — ABNORMAL HIGH (ref 11.5–15.5)
WBC Count: 10 10*3/uL (ref 4.0–10.5)
nRBC: 0 % (ref 0.0–0.2)

## 2018-11-03 LAB — COMPREHENSIVE METABOLIC PANEL
ALT: 10 U/L (ref 0–44)
AST: 29 U/L (ref 15–41)
Albumin: 2.1 g/dL — ABNORMAL LOW (ref 3.5–5.0)
Alkaline Phosphatase: 78 U/L (ref 38–126)
Anion gap: 9 (ref 5–15)
BUN: 15 mg/dL (ref 8–23)
CO2: 24 mmol/L (ref 22–32)
Calcium: 8.3 mg/dL — ABNORMAL LOW (ref 8.9–10.3)
Chloride: 104 mmol/L (ref 98–111)
Creatinine, Ser: 0.79 mg/dL (ref 0.44–1.00)
GFR calc Af Amer: >60 mL/min (ref >60)
GFR calc non Af Amer: >60 mL/min (ref >60)
Glucose, Bld: 116 mg/dL — ABNORMAL HIGH (ref 70–99)
Potassium: 2.9 mmol/L — CL (ref 3.5–5.1)
Sodium: 137 mmol/L (ref 135–145)
Total Bilirubin: 0.4 mg/dL (ref 0.3–1.2)
Total Protein: 7.5 g/dL (ref 6.5–8.1)

## 2018-11-03 LAB — MAGNESIUM: Magnesium: 1.7 mg/dL (ref 1.7–2.4)

## 2018-11-03 MED ORDER — TRAMADOL HCL 50 MG PO TABS: 50.0000 mg | ORAL_TABLET | Freq: Four times a day (QID) | ORAL | 0 refills | Status: AC | PRN

## 2018-11-03 MED ORDER — HEPARIN SOD (PORK) LOCK FLUSH 100 UNIT/ML IV SOLN
500.0000 [IU] | Freq: Once | INTRAVENOUS | Status: AC | PRN
  Administered 2018-11-03: 500 [IU]
  Filled 2018-11-03: qty 5

## 2018-11-03 MED ORDER — ONDANSETRON 4 MG PO TBDP: 4.0000 mg | ORAL_TABLET | Freq: Three times a day (TID) | ORAL | 1 refills | Status: AC | PRN

## 2018-11-03 MED ORDER — POTASSIUM CHLORIDE CRYS ER 20 MEQ PO TBCR: 20.0000 meq | EXTENDED_RELEASE_TABLET | Freq: Two times a day (BID) | ORAL | 0 refills | Status: AC

## 2018-11-03 MED ORDER — SODIUM CHLORIDE 0.9% FLUSH
10.0000 mL | INTRAVENOUS | Status: DC | PRN
  Administered 2018-11-03: 10 mL
  Filled 2018-11-03: qty 10

## 2018-11-03 MED ORDER — HYDROMORPHONE HCL 2 MG PO TABS: 2.0000 mg | ORAL_TABLET | Freq: Four times a day (QID) | ORAL | 0 refills | Status: AC | PRN

## 2018-11-03 NOTE — Patient Instructions (Signed)
We will plan to obtain a CT scan of the abdomen and pelvis on November 08, 2018.  You will then see Dr. Skeet Latch in the office on June 4.  We will also obtain repeat lab work prior to your appointment on November 10, 2018.  Your potassium level was low today so we have prescribed potassium to take twice a day.  You can dissolve this with a little bit of water and mix it with yogurt or applesauce since the pills can be large.    We have also refilled the tramadol and have prescribed Dilaudid (hydromorphone) for pain.  Use the tramadol for mild to moderate pain and the hydromorphone for severe pain.  Please let us know if you have difficulty tolerating the stronger pain medication like increase in confusion or oversedation.  Zofran has also been prescribed for nausea and it can be dissolved on the tongue.  Hydromorphone tablets What is this medicine? HYDROMORPHONE (hye droe MOR fone) is a pain reliever. It is used to treat moderate to severe pain. This medicine may be used for other purposes; ask your health care provider or pharmacist if you have questions. COMMON BRAND NAME(S): Dilaudid What should I tell my health care provider before I take this medicine? They need to know if you have any of these conditions: -brain tumor -drug abuse or addiction -head injury -heart disease -if you often drink alcohol -kidney disease -liver disease -lung or breathing disease, like asthma -problems urinating -seizures -stomach or intestine problems -an unusual or allergic reaction to hydromorphone, other medicines, foods, dyes, or preservatives -pregnant or trying to get pregnant -breast-feeding How should I use this medicine? Take this medicine by mouth with a glass of water. Follow the directions on the prescription label. You can take this medicine with or without food. If it upsets your stomach, take it with food. Take your medicine at regular intervals. Do not take it more often than directed. Do not stop  taking except on your doctor's advice. A special MedGuide will be given to you by the pharmacist with each prescription and refill. Be sure to read this information carefully each time. Talk to your pediatrician regarding the use of this medicine in children. Special care may be needed. Overdosage: If you think you have taken too much of this medicine contact a poison control center or emergency room at once. NOTE: This medicine is only for you. Do not share this medicine with others. What if I miss a dose? If you miss a dose, take it as soon as you can. If it is almost time for your next dose, take only that dose. Do not take double or extra doses. What may interact with this medicine? This medicine may interact with the following medications: -alcohol -antihistamines for allergy, cough and cold -certain medicines for anxiety or sleep -certain medicines for depression like amitriptyline, fluoxetine, sertraline -certain medicines for seizures like phenobarbital, primidone -general anesthetics like halothane, isoflurane, methoxyflurane, propofol -local anesthetics like lidocaine, pramoxine, tetracaine -MAOIs like Carbex, Eldepryl, Marplan, Nardil, and Parnate -medicines that relax muscles for surgery -other narcotic medicines for pain or cough -phenothiazines like chlorpromazine, mesoridazine, prochlorperazine, thioridazine This list may not describe all possible interactions. Give your health care provider a list of all the medicines, herbs, non-prescription drugs, or dietary supplements you use. Also tell them if you smoke, drink alcohol, or use illegal drugs. Some items may interact with your medicine. What should I watch for while using this medicine? Tell your doctor or  health care professional if your pain does not go away, if it gets worse, or if you have new or a different type of pain. You may develop tolerance to the medicine. Tolerance means that you will need a higher dose of the  medicine for pain relief. Tolerance is normal and is expected if you take this medicine for a long time. Do not suddenly stop taking your medicine because you may develop a severe reaction. Your body becomes used to the medicine. This does NOT mean you are addicted. Addiction is a behavior related to getting and using a drug for a non-medical reason. If you have pain, you have a medical reason to take pain medicine. Your doctor will tell you how much medicine to take. If your doctor wants you to stop the medicine, the dose will be slowly lowered over time to avoid any side effects. There are different types of narcotic medicines (opiates). If you take more than one type at the same time or if you are taking another medicine that also causes drowsiness, you may have more side effects. Give your health care provider a list of all medicines you use. Your doctor will tell you how much medicine to take. Do not take more medicine than directed. Call emergency for help if you have problems breathing or unusual sleepiness. You may get drowsy or dizzy. Do not drive, use machinery, or do anything that needs mental alertness until you know how this medicine affects you. Do not stand or sit up quickly, especially if you are an older patient. This reduces the risk of dizzy or fainting spells. Alcohol may interfere with the effect of this medicine. Avoid alcoholic drinks. This medicine will cause constipation. Try to have a bowel movement at least every 2 to 3 days. If you do not have a bowel movement for 3 days, call your doctor or health care professional. Your mouth may get dry. Chewing sugarless gum or sucking hard candy, and drinking plenty of water may help. Contact your doctor if the problem does not go away or is severe. What side effects may I notice from receiving this medicine? Side effects that you should report to your doctor or health care professional as soon as possible: -allergic reactions like skin rash,  itching or hives, swelling of the face, lips, or tongue -breathing problems -confusion -seizures -signs and symptoms of low blood pressure like dizziness; feeling faint or lightheaded, falls; unusually weak or tired -trouble passing urine or change in the amount of urine Side effects that usually do not require medical attention (report to your doctor or health care professional if they continue or are bothersome): -constipation -dry mouth -nausea, vomiting -tiredness This list may not describe all possible side effects. Call your doctor for medical advice about side effects. You may report side effects to FDA at 1-800-FDA-1088. Where should I keep my medicine? Keep out of the reach of children. This medicine can be abused. Keep your medicine in a safe place to protect it from theft. Do not share this medicine with anyone. Selling or giving away this medicine is dangerous and against the law. Store at room temperature between 15 and 30 degrees C (59 and 86 degrees F). Keep container tightly closed. Protect from light. This medicine may cause harm and death if it is taken by other adults, children, or pets. Return medicine that has not been used to an official disposal site. Contact the DEA at 956 024 9934 or your city/county government to find a  site. If you cannot return the medicine, flush it down the toilet. Do not use the medicine after the expiration date. NOTE: This sheet is a summary. It may not cover all possible information. If you have questions about this medicine, talk to your doctor, pharmacist, or health care provider.  2019 Elsevier/Gold Standard (2016-09-29 14:26:08)  Ondansetron oral dissolving tablet What is this medicine? ONDANSETRON (on DAN se tron) is used to treat nausea and vomiting caused by chemotherapy. It is also used to prevent or treat nausea and vomiting after surgery. This medicine may be used for other purposes; ask your health care provider or pharmacist if  you have questions. COMMON BRAND NAME(S): Zofran ODT What should I tell my health care provider before I take this medicine? They need to know if you have any of these conditions: -heart disease -history of irregular heartbeat -liver disease -low levels of magnesium or potassium in the blood -an unusual or allergic reaction to ondansetron, granisetron, other medicines, foods, dyes, or preservatives -pregnant or trying to get pregnant -breast-feeding How should I use this medicine? These tablets are made to dissolve in the mouth. Do not try to push the tablet through the foil backing. With dry hands, peel away the foil backing and gently remove the tablet. Place the tablet in the mouth and allow it to dissolve, then swallow. While you may take these tablets with water, it is not necessary to do so. Talk to your pediatrician regarding the use of this medicine in children. Special care may be needed. Overdosage: If you think you have taken too much of this medicine contact a poison control center or emergency room at once. NOTE: This medicine is only for you. Do not share this medicine with others. What if I miss a dose? If you miss a dose, take it as soon as you can. If it is almost time for your next dose, take only that dose. Do not take double or extra doses. What may interact with this medicine? Do not take this medicine with any of the following medications: -apomorphine -certain medicines for fungal infections like fluconazole, itraconazole, ketoconazole, posaconazole, voriconazole -cisapride -dofetilide -dronedarone -pimozide -thioridazine -ziprasidone This medicine may also interact with the following medications: -carbamazepine -certain medicines for depression, anxiety, or psychotic disturbances -fentanyl -linezolid -MAOIs like Carbex, Eldepryl, Marplan, Nardil, and Parnate -methylene blue (injected into a vein) -other medicines that prolong the QT interval (cause an  abnormal heart rhythm) -phenytoin -rifampicin -tramadol This list may not describe all possible interactions. Give your health care provider a list of all the medicines, herbs, non-prescription drugs, or dietary supplements you use. Also tell them if you smoke, drink alcohol, or use illegal drugs. Some items may interact with your medicine. What should I watch for while using this medicine? Check with your doctor or health care professional as soon as you can if you have any sign of an allergic reaction. What side effects may I notice from receiving this medicine? Side effects that you should report to your doctor or health care professional as soon as possible: -allergic reactions like skin rash, itching or hives, swelling of the face, lips, or tongue -breathing problems -confusion -dizziness -fast or irregular heartbeat -feeling faint or lightheaded, falls -fever and chills -loss of balance or coordination -seizures -sweating -swelling of the hands and feet -tightness in the chest -tremors -unusually weak or tired Side effects that usually do not require medical attention (report to your doctor or health care professional if they continue  or are bothersome): -constipation or diarrhea -headache This list may not describe all possible side effects. Call your doctor for medical advice about side effects. You may report side effects to FDA at 1-800-FDA-1088. Where should I keep my medicine? Keep out of the reach of children. Store between 2 and 30 degrees C (36 and 86 degrees F). Throw away any unused medicine after the expiration date. NOTE: This sheet is a summary. It may not cover all possible information. If you have questions about this medicine, talk to your doctor, pharmacist, or health care provider.  2019 Elsevier/Gold Standard (2013-03-01 16:21:52)

## 2018-11-03 NOTE — Progress Notes (Signed)
GYN ONCOLOGY OFFICE VISIT  Referring Clinician:  Dr. Kayleen Memos  CC:  Abdominal pain, nausea  HPI:  HISTORY OF PRESENT ILLNESS:  [name]  is a lovely 73 y.o.  P3 with fallopian tube cancer.    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 CK20, CEA and Ca19-9 were negative    09/16/2017 Imaging    CT abdomen and pelvis elsewhere showed small bilateral pleural effusion, cardiomegaly, 2.6 cm ovoid cystic mass at the pancreatic tail, 3.5 cm saccular aneurysm, no retroperitoneal lymphadenopathy, marked sigmoid wall thickening without overt mechanical bowel obstruction, marked omental thickening and ascites.    10/07/2017 Genetic Testing    BRCA2 c.5073dupA (p.Trp1692Metfs*3) pathogenic mutation and APC c.7088A>C (p.Lys2363Arg), POLE c.1007A>G (p.Asn36Ser) and RAD51D c.434G>A (p.Arg145His) VUS identified the [facility] panel.  The [facility] gene panel offered by [employer] includes sequencing and deletion/duplication testing of the following 35 genes: APC, ATM, AXIN2, BARD1, BMPR1A, BRCA1, BRCA2, BRIP1, CHD1, CDK4, CDKN2A, CHEK2, EPCAM (large rearrangement only), HOXB13, (sequencing only), GALNT12, MLH1, MSH2, MSH3 (excluding repetitive portions of exon 1), MSH6, MUTYH, NBN, NTHL1, PALB2, PMS2, PTEN, RAD51C, RAD51D, RNF43, RPS20, SMAD4, STK11, and TP53. Sequencing was performed for select regions of POLE and POLD1, and large rearrangement analysis was performed for select regions of GREM1. The report date is Oct 07, 2017.    10/08/2017 Procedure   Extensive omental and peritoneal tumor is present anteriorly deep to the abdominal wall. From a midline approach, tumor was sampled just to the left of midline in the upper pelvis, below the level of the umbilicus. Solid tissue was obtained.  IMPRESSION: CT-guided core biopsy performed of omental/peritoneal tumor in the anterior peritoneal cavity.    10/08/2017 Pathology Results    Soft Tissue Needle Core Biopsy, Omentum/Peritoneal Cavity - POORLY DIFFERENTIATED CARCINOMA. Microscopic Comment Immunohistochemistry will be performed and reported as an addendum. Dr. [name] agrees. Called to [employer] on 10/11/17. (JDP:kh 10/11/17) ADDENDUM: Immunohistochemistry shows the tumor is positive with cytokeratin AE1/AE3, cytokeratin 7, estrogen receptor, WT-1, and p16. The tumor is negative with CD56, chromogranin, synaptophysin, CDX-2, cytokeratin 20, progesterone receptor, GATA-3, GCDFP, Napsin-A and thyroid transcription factor-1. 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 The catheter is ready for use.    10/14/2017 Procedure    A total of approximately 1300 mL of ascitic fluid was removed.    10/15/2017 Tumor Marker    Patient's tumor was tested for the following markers: CA-125 Results of the tumor marker test revealed 775.9    10/18/2017 -  Chemotherapy    The patient had carboplatin and Taxol    10/22/2017 - 10/27/2017 Hospital Admission    She was admitted to the hospital for evaluation of  shortness of breath and was found to have large pleural effusion, successfully removed and cytology was malignant.  Sepsis was ruled  out.    10/23/2017 Imaging    No evidence of pulmonary embolism.  New moderate size left effusion and new small to moderate right effusion with associated bibasilar atelectasis.  Known extensive peritoneal carcinomatosis and moderate ascites.  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 Recommend correlation with prior exams and vascular surgery consultation.  2.1 cm left renal cyst.  Mild cardiomegaly and minimal atherosclerotic coronary artery disease.  Aortic Atherosclerosis (ICD10-I70.0) and Emphysema (ICD10-J43.9).    10/24/2017 Pathology Results    PLEURAL FLUID, LEFT (SPECIMEN 1 OF 1 COLLECTED 10/24/17): MALIGNANT CELLS CONSISTENT WITH METASTATIC ADENOCARCINOMA. SEE COMMENT.    10/24/2017 Procedure    Successful ultrasound guided left thoracentesis yielding 580 mL of pleural fluid.  Follow-up chest radiograph shows no pneumothorax    11/03/2017 Tumor Marker    Patient's tumor was tested for the following markers: CA-125 Results of the tumor marker test revealed 991.9    11/29/2017 Tumor Marker    Patient's tumor was tested for the following markers: CA-125 Results of the tumor marker test revealed 133    12/14/2017 Imaging    Decreased peritoneal carcinomatosis, and near complete resolution of ascites since prior exam.  No new or progressive disease within the abdomen or pelvis.  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  Skeet Latch, M.D. 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  Miliary disease of the bilateral diaphragmatic surfaces and also the mesentery of the large and small bowel.  Left adnexa densely adherent to the distal rectosigmoid colon.  Right adnexa adherent to the pelvic sidewall.  Multiple loops of bowel adherent to miliary disease in the vaginal cuff.  At the completion of procedure the patient was R1 disease status     01/27/2018 Pathology Results    1. Omentum, resection for tumor, infragastric - HIGH GRADE SEROUS CARCINOMA, >2 CM. - SEE ONCOLOGY TABLE. 2. Soft tissue mass, simple excision, abdominal wall nodule - HIGH GRADE SEROUS CARCINOMA, >2 CM. - SEE ONCOLOGY TABLE. 3. Ovary and fallopian tube, left - OVARY AND FALLOPIAN TUBE WITH SURFACE SEROUS CARCINOMA. 4. Ovary and fallopian tube, right - OVARY AND FALLOPIAN TUBE WITH SURFACE SEROUS CARCINOMA. Microscopic Comment 1. OVARY or FALLOPIAN TUBE or PRIMARY PERITONEUM: Procedure: Omentectomy, soft tissue excision, bilateral salpingo-oophorectomy. Specimen Integrity: Intact. Tumor Site: See comment. Ovarian Surface Involvement (required only if applicable): Present, bilateral. Fallopian Tube Surface Involvement (required only if applicable): Present, bilateral. Tumor Size: >2 cm, see comment. Histologic Type: High grade serous carcinoma. Histologic Grade: High grade. Implants (required  for advanced stage serous/seromucinous borderline tumors only): See comment. Other Tissue/ Organ Involvement: Bilateral  fallopian tubes, bilateral ovaries, omentum, peritoneum. Largest Extrapelvic Peritoneal Focus (required only if applicable): >2 cm. Peritoneal/Ascitic Fluid: N/A. Pleural effusion ([id number]) positive. Treatment Effect (required only for high-grade serous carcinomas): CRS-1 Regional Lymph Nodes: No lymph nodes submitted or found Pathologic Stage Classification (pTNM, AJCC 8th Edition): ypT3c, ypNX, pM1a Representative Tumor Block: 1A-B Comment(s): The omentum is largely infiltrated by tumor (>2 cm). Both ovaries and fallopian tubes were entirely submitted. A distinct precursor lesion is not identified, only surface deposits. While it is not recommended to diagnose a primary peritoneal carcinoma after treatment, it is assumed the likely primary given the lack of precursor lesion or significant disease in the ovaries and fallopian tubes.    01/27/2018 - 03/23/2018 Hospital Admission    She was admitted to the hospital for interval debulking surgery, complicated by sepsis and respiratory failure.    03/08/2018 Imaging    7.0 x 1.3 cm left pelvic fluid collection is noted with air-fluid level and enhancing margins, most consistent with abscess. It is slightly smaller compared to prior exam.  Stable position of surgical drainage catheter is seen in the pelvis.  Sigmoid diverticulosis is noted without inflammation.  3.9 cm aortic aneurysm is noted at aortic hiatus. Recommend followup by [name] in 2 years. This recommendation follows ACR consensus guidelines: White Paper of the ACR Incidental Findings Committee II on Vascular Findings. Joellyn Rued Radiol 2013; [phone].     03/29/2018 Tumor Marker    Patient's tumor was tested for the following markers: CA-125 Results of the tumor marker test revealed 35.3    04/07/2018 Imaging    CT abdomen and pelvis Persistent rim enhancing fluid and gas collection in the left pelvic sidewall measuring 7.2 x 3.0 cm, and not significantly changed compared to  prior study. Surgical drain remains in place in the central pelvis.  Sigmoid colon diverticulosis, with muscular hypertrophy versus mild diverticulitis.  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 Consider vascular surgery referral/consultation if not already obtained.    04/18/2018 Imaging    CT-guided placement of a drainage catheter within the left pelvic sidewall collection. The collection contains mostly gas and only 1 mL bloody fluid could be removed. The fluid was sent for culture.  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  Will schedule for follow-up in the IR clinic in 1 week with a CT and drain injection.    04/26/2018 Imaging    CT abdomen and pelvis 1. Left pelvic sidewall abscess collection is decompressed with the percutaneous drain. No residual fluid. There continues to be soft tissue thickening and small pockets of gas in this area. Findings concerning for a residual colonic fistula tract but this will be further characterized with drain injection with fluoroscopy. No new abscess or fluid collections. 2. Persistent gas in the left pelvis related to the old surgical drain site. However, this gas-filled cavity is decreasing in size compared to 04/18/2018. 3. 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 Aneurysm measures up to 3.8 cm. Aortic aneurysm NOS (ICD10-I71.9). Aortic Atherosclerosis (ICD10-I70.0).  4. 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    04/26/2018 Procedure    Positive for a fistula tract between the percutaneous drain and the sigmoid colon.  No significant abscess cavity.     05/10/2018 Tumor Marker    Patient's tumor was tested for the following markers: CA-125 Results  of the tumor marker test revealed 18.8    05/10/2018 Imaging    1. Left pelvic sidewall abscess collection remains decompressed with the percutaneous drain. No residual fluid collection. There  continues to be a small amount of gas adjacent to the drain and likely related to a fistula tract. Patient is scheduled for a drain injection. 2. No new abscess or fluid collections. Again noted is trace perisplenic fluid. 3. 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 Aneurysm measures up to 3.8 cm. Aortic aneurysm NOS (ICD10-I71.9). Aortic Atherosclerosis (ICD10-I70.0).    05/10/2018 Procedure    Persistent fistula tract between the decompressed abscess cavity and the sigmoid colon. The fistula tract remains widely patent but smaller than it was on 04/26/2018.  The drain was kept in place. Instructed the patient to flush the tube every other day. Patient requested a suction bulb but we instructed the patient NOT to use suction on this bulb. Patient will follow-up for a drain injection in approximately 2 weeks.    06/10/2018 Tumor Marker    Patient's tumor was tested for the following markers: CA-125 Results of the tumor marker test revealed 19.4    06/30/2018 Imaging    1. New small RIGHT pleural effusion and nonspecific retroperitoneal/mesenteric edema. 2. LEFT pelvic drainage catheter again noted - no evidence of residual abscess. 3. 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 4. Cardiomegaly. 5.  Aortic Atherosclerosis (ICD10-I70.0).    06/30/2018 - 07/07/2018 Hospital Admission    She was admitted due to weakness and was found to have bacteremia    07/02/2018 Procedure    Status post removal of right IJ port catheter.    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  This required intervention by vascular surgery.       Jo Parker was admitted in 07/2018 for a mycotic aneurysm that required vascular surgical procedure. Has a persistent low output fistula, There has been minimal  output, and several attempts to withdraw the drain and seal the fistula with fibrin glue have been unsuccessful.  An attempt was made to slowly withdraw the drain to see if this would slowly aid in resolution of the small persistent output.    An attempt was made last week with the development of an abscess.  Jo Parker was stated on axoicillin with clavulanic acid  and the drain was replaced.  Since replacement Jo Parker reports no fever but a sense of poor well being, poor appetite associated with medication, and low mood.  She was admitted to [facility] for VIR drainage 10/09/2018.  Was discharged home on unasyn.  Pelvic abscess cultures are resistant to unasyn. Discussed with Dr. Noreene Filbert (ID) recommend cefepime/Flagy IV until abscess is smaller.   Presents today with c/o abdominal pain, distension and neausea.  Reports BM.  Appetite decreases with pain. Jo Parker says that she's failing but does not wish hospice.  Afebrile. 75cc output per day.    Current Meds:  Outpatient Encounter Medications as of 11/03/2018  Medication Sig  . acetaminophen (TYLENOL) 500 MG tablet Take 500 mg by mouth every 6 (six) hours as needed for mild pain or headache.  Marland Kitchen aspirin 81 MG chewable tablet Chew 81 mg by mouth daily.  . carvedilol (COREG) 25 MG tablet Take 1 tablet (25 mg total) by mouth  2 (two) times a day.  . clopidogrel (PLAVIX) 75 MG tablet Take 75 mg by mouth daily.  . diphenoxylate-atropine (LOMOTIL) 2.5-0.025 MG tablet Take 1 tablet by mouth 4 (four) times daily as needed for diarrhea or loose stools.  . feeding supplement (BOOST HIGH PROTEIN) LIQD Take 1 Container by mouth 2 (two) times daily between meals.  . fluconazole (DIFLUCAN) 200 MG tablet Take 1 tablet (200 mg total) by mouth daily.  . magnesium oxide (MAG-OX) 400 MG tablet Take 400 mg by mouth 2 (two) times daily.   . mirtazapine (REMERON) 30 MG tablet Take 1 tablet (30 mg total) by mouth at bedtime. (Patient taking differently:  Take 15 mg by mouth at bedtime. )  . Pediatric Multiple Vit-C-FA (MULTIVITAMIN ANIMAL SHAPES, WITH CA/FA,) with C & FA chewable tablet Chew 1 tablet by mouth daily.  . sertraline (ZOLOFT) 25 MG tablet Take 1 tablet (25 mg total) by mouth daily.  . sodium chloride 0.9 % injection Inject 10 mLs into the vein daily. Inject 10 mL into left pelvic drain once daily  . traMADol (ULTRAM) 50 MG tablet Take 1 tablet (50 mg total) by mouth every 6 (six) hours as needed for moderate pain.   Facility-Administered Encounter Medications as of 11/03/2018  Medication  . sodium chloride flush (NS) 0.9 % injection 10 mL    Allergy:  Allergies  Allergen Reactions  . Oxycodone Other (See Comments)    confusion    Social Hx:   Social History   Socioeconomic History  . Marital status: Married    Spouse name: Not on file  . Number of children: 3  . Years of education: Not on file  . Highest education level: Not on file  Occupational History  . Occupation: retired  Scientific laboratory technician  . Financial resource strain: Not on file  . Food insecurity:    Worry: Not on file    Inability: Not on file  . Transportation needs:    Medical: Not on file    Non-medical: Not on file  Tobacco Use  . Smoking status: Former Smoker    Packs/day: 1.00    Years: 55.00    Pack years: 55.00    Types: Cigarettes    Last attempt to quit: 10/06/2017    Years since quitting: 1.0  . Smokeless tobacco: Never Used  Substance and Sexual Activity  . Alcohol use: Never    Frequency: Never  . Drug use: Never  . Sexual activity: Not Currently    Partners: Male  Lifestyle  . Physical activity:    Days per week: Not on file    Minutes per session: Not on file  . Stress: Not on file  Relationships  . Social connections:    Talks on phone: Not on file    Gets together: Not on file    Attends religious service: Not on file    Active member of club or organization: Not on file    Attends meetings of clubs or organizations: Not  on file    Relationship status: Not on file  . Intimate partner violence:    Fear of current or ex partner: Not on file    Emotionally abused: Not on file    Physically abused: Not on file    Forced sexual activity: Not on file  Other Topics Concern  . Not on file  Social History Narrative  . Not on file    Past Surgical Hx:  Past Surgical History:  Procedure Laterality  Date  . ABDOMINAL HYSTERECTOMY    . cardiac monitor implant    . CEREBRAL ANEURYSM REPAIR    . CERVICAL SPINE SURGERY     bone spurs  . DEBULKING N/A 01/27/2018   Procedure: RADICAL DEBULKING;  Surgeon: [name], MD;  Location: [facility];  Service: Gynecology;  Laterality: N/A;  . IR CATHETER TUBE CHANGE  06/13/2018  . IR CATHETER TUBE CHANGE  09/28/2018  . IR CATHETER TUBE CHANGE  10/04/2018  . IR FLUORO GUIDE PORT INSERTION RIGHT  10/14/2017  . IR PARACENTESIS  10/14/2017  . IR RADIOLOGIST EVAL & MGMT  04/26/2018  . IR RADIOLOGIST EVAL & MGMT  05/10/2018  . IR RADIOLOGIST EVAL & MGMT  05/25/2018  . IR RADIOLOGIST EVAL & MGMT  07/20/2018  . IR REMOVAL TUN ACCESS W/ PORT W/O FL MOD SED  07/02/2018  . IR SINUS/FIST TUBE CHK-NON GI  10/04/2018  . IR US GUIDE VASC ACCESS RIGHT  10/14/2017  . LAPAROTOMY N/A 01/27/2018   Procedure: EXPLORATORY LAPAROTOMY WITH REMOVAL OF BILATERAL TUBES AND OVARIES;  Surgeon: [name], MD;  Location: [facility];  Service: Gynecology;  Laterality: N/A;  . LAPAROTOMY N/A 01/31/2018   Procedure: EXPLORATORY LAPAROTOMY, ABDOMINAL WASHOUT;  Surgeon: Isabel Caprice, MD;  Location: [facility];  Service: Gynecology;  Laterality: N/A;  . OMENTECTOMY N/A 01/27/2018   Procedure: OMENTECTOMY;  Surgeon: [name], MD;  Location: [facility];  Service: Gynecology;  Laterality: N/A;  . PARTIAL HYSTERECTOMY  1971   "precancer"  . TEE WITHOUT CARDIOVERSION N/A 07/04/2018   Procedure: TRANSESOPHAGEAL ECHOCARDIOGRAM (TEE);  Surgeon: Sanda Klein, MD;  Location: [facility] ENDOSCOPY;  Service: Cardiovascular;   Laterality: N/A;    Past Medical Hx:  Past Medical History:  Diagnosis Date  . Acute on chronic respiratory failure with hypoxia ([place]) 03/07/2018  . Allergies   . Anxiety and depression   . Aortic aneurysm ([place])    see ov note 03/2017   . Arthritis   . Aspiration pneumonia ([place])   . Carcinomatosis ([place]) 10/2017  . CHF (congestive heart failure) ([name])    diastolic dysfunction  . COPD (chronic obstructive pulmonary disease) ([place]) 10/23/2017  . Depression   . Family history of breast cancer   . Family history of prostate cancer   . Family history of uterine cancer   . History of diverticulitis   . HLD (hyperlipidemia) 10/23/2017  . Hypertension   . Mitral valve prolapse   . Pelvic abscess in female   . Sleep apnea    cpap machine mask and tubing in [place] per patient.  Patient does not have mask or tubing to bring on DOS of 01/27/2018.   Marland Kitchen Uterine cancer ([place])    had partial hysterectomy at 27    Past Gynecological History:   GYNECOLOGIC HISTORY:  No LMP recorded (lmp unknown). Patient has had a hysterectomy.  For precancer Menarche: 73 years old P 3 LMP 1977 Contraceptive OCP x8 years HRT none  Last Pap N/A  Family Hx:  Family History  Problem Relation Age of Onset  . Endometrial cancer Sister 58       endometrial ca  . Endometrial cancer Paternal Aunt 80  . Breast cancer Paternal Aunt   . Stomach cancer Maternal Grandfather        d. 38s  . Prostate cancer Paternal Uncle   . Breast cancer Cousin        pat first cousin  . Breast cancer Cousin  pat first cousin's daughter  . Lung cancer Father        lung ca  . Heart disease Mother   . Arthritis Mother   . Hyperlipidemia Mother   . Stroke Paternal Grandmother   . Endometrial cancer Cousin        paternal first cousin's daughter  . Breast cancer Other        niece dx less than 63s  . Breast cancer Other        niece, dx under 74    Review of Systems:  Review of Systems  Constitutional:  Positive for appetite change. Negative for chills and fatigue.       Fair appetite  HENT:   Negative for tinnitus and trouble swallowing.   Eyes: Negative.   Respiratory: Negative for chest tightness, cough, shortness of breath and wheezing.   Cardiovascular: Negative for chest pain and leg swelling.  Gastrointestinal: Negative for abdominal distention, abdominal pain, constipation, diarrhea, nausea, rectal pain and vomiting.       Drain intermittently clogs at the three way stopcock.   Endocrine: Negative.   Genitourinary: Negative for difficulty urinating, dysuria, frequency, hematuria, pelvic pain, vaginal bleeding and vaginal discharge.   Musculoskeletal: Negative for back pain.  Skin: Negative.   Neurological: Negative.   Hematological: Negative.   Psychiatric/Behavioral: Positive for depression. Negative for confusion. The patient is not nervous/anxious.    Physical Exam: BP 110/61 (BP Location: Left Arm, Patient Position: Sitting)   Pulse 90   Temp 98.5 F (36.9 C) (Oral)   Resp 17   Ht '5\' 3"'$  (1.6 m)   Wt 101 lb 12.8 oz (46.2 kg)   LMP  (LMP Unknown)   SpO2 100%   BMI 18.03 kg/m   Wt Readings from Last 3 Encounters:  11/03/18 101 lb 12.8 oz (46.2 kg)  10/28/18 105 lb 3.2 oz (47.7 kg)  10/03/18 103 lb 9.9 oz (47 kg)   Weight 95.2 pounds per patient report General :   73 y.o., female in no apparent distress, frail appearing. HEENT:  Normocephalic/atraumatic, symmetric, EOMI, eyelids normal Neck:    Abdomen: drainage of dark green liquid  From drain.  Abdomen distended, with firn predominantly right sided mass Pelvic.  Central pelvic mass 14cm smooth fixed, non tender. Rectal:  Confirms findings of the pelvic mass.  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 Mrs. Rocque  completed 4 cycles of paclitaxel carboplatin with reduction in Ca1 25-133.  December 20, 2017.  Underwent robotic assisted bilateral salpingo-oophorectomy  omentectomy interval debulking on January 27, 2018.  Postop course complicated by sepsis with suspicion of perforation.  She underwent an exploratory laparotomy however the microperforation could not be identified.   A drain remains in place for management of the pelvic abscess Jo Parker is not a candidate for surgical intervention given her functional status.  Antibiotics changed to cefepime/Flagyl IV.  Vir will reassess abscess on June 8.   Continue PT/OT  Pelvic mass Large pelvic mass? Fluid collection versus recurrent disease. 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   [name], MD   Cc: Toney Reil, MD, [facility]

## 2018-11-07 ENCOUNTER — Encounter: Payer: Self-pay | Admitting: Hematology and Oncology

## 2018-11-07 MED ORDER — SODIUM CHLORIDE 0.9 % IV SOLN
INTRAVENOUS | Status: DC
Start: ? — End: 2018-11-07

## 2018-11-07 MED ORDER — COPPERTONE SPF8 EX
10.00 | CUTANEOUS | Status: DC
Start: 2018-11-07 — End: 2018-11-07

## 2018-11-07 MED ORDER — OLANZAPINE-FLUOXETINE HCL 6-50 MG PO CAPS
3.00 | ORAL_CAPSULE | ORAL | Status: DC
Start: ? — End: 2018-11-07

## 2018-11-07 MED ORDER — Medication
Status: DC
Start: ? — End: 2018-11-07

## 2018-11-07 MED ORDER — CRANBERRY EXTRACT 250 MG PO TABS
40.00 | ORAL_TABLET | ORAL | Status: DC
Start: 2018-11-10 — End: 2018-11-07

## 2018-11-07 MED ORDER — PANTOPRAZOLE SODIUM 40 MG IV SOLR
40.00 | INTRAVENOUS | Status: DC
Start: 2018-11-12 — End: 2018-11-07

## 2018-11-07 MED ORDER — TRAMADOL HCL 50 MG PO TABS
50.00 | ORAL_TABLET | ORAL | Status: DC
Start: ? — End: 2018-11-07

## 2018-11-07 MED ORDER — DAYHIST-1 PO
30.00 | ORAL | Status: DC
Start: 2018-11-08 — End: 2018-11-07

## 2018-11-07 MED ORDER — POLYETHYLENE GLYCOL 3350 17 G PO PACK
17.00 | PACK | ORAL | Status: DC
Start: 2018-11-11 — End: 2018-11-07

## 2018-11-07 MED ORDER — BARO-CAT PO
10.00 | ORAL | Status: DC
Start: ? — End: 2018-11-07

## 2018-11-07 MED ORDER — ONDANSETRON 4 MG PO TBDP
4.00 | ORAL_TABLET | ORAL | Status: DC
Start: ? — End: 2018-11-07

## 2018-11-07 MED ORDER — DEXTROSE-NACL 5-0.45 % IV SOLN
50.00 | INTRAVENOUS | Status: DC
Start: ? — End: 2018-11-07

## 2018-11-07 MED ORDER — ONDANSETRON HCL 4 MG/2ML IJ SOLN
4.00 | INTRAMUSCULAR | Status: DC
Start: ? — End: 2018-11-07

## 2018-11-07 MED ORDER — QUINERVA 260 MG PO TABS
650.00 | ORAL_TABLET | ORAL | Status: DC
Start: ? — End: 2018-11-07

## 2018-11-07 MED ORDER — EQ MICONAZOLE 7 VA
875.00 | VAGINAL | Status: DC
Start: 2018-11-07 — End: 2018-11-07

## 2018-11-07 MED ORDER — MAGNESIUM OXIDE 400 MG PO TABS
400.00 | ORAL_TABLET | ORAL | Status: DC
Start: 2018-11-09 — End: 2018-11-07

## 2018-11-08 ENCOUNTER — Ambulatory Visit (HOSPITAL_COMMUNITY): Payer: Medicare PPO

## 2018-11-08 MED ORDER — MEDI-TUSSIN DM DOUBLE STRENGTH 30-200 MG/5ML PO LIQD
1000.00 | ORAL | Status: DC
Start: ? — End: 2018-11-08

## 2018-11-08 MED ORDER — CATALYTIC FORMULA PO
25.00 | ORAL | Status: DC
Start: 2018-11-09 — End: 2018-11-08

## 2018-11-08 MED ORDER — NEXTERONE IV
81.00 | INTRAVENOUS | Status: DC
Start: 2018-11-10 — End: 2018-11-08

## 2018-11-08 MED ORDER — FP ANTI-DIARRHEAL 1 MG/5ML PO LIQD
500.00 | ORAL | Status: DC
Start: 2018-11-09 — End: 2018-11-08

## 2018-11-08 MED ORDER — SODIUM CHLORIDE 0.9 % IV SOLN
75.00 | INTRAVENOUS | Status: DC
Start: ? — End: 2018-11-08

## 2018-11-08 MED ORDER — TRANDOLAPRIL
2.00 | Status: DC
Start: 2018-11-08 — End: 2018-11-08

## 2018-11-08 MED ORDER — MYLANTA ULTRA 700-300 MG PO CHEW
3.00 | CHEWABLE_TABLET | ORAL | Status: DC
Start: ? — End: 2018-11-08

## 2018-11-08 MED ORDER — GENERIC EXTERNAL MEDICATION
750.00 | Status: DC
Start: 2018-11-10 — End: 2018-11-08

## 2018-11-08 MED ORDER — TUMS LASTING EFFECTS PO
30.00 | ORAL | Status: DC
Start: 2018-11-10 — End: 2018-11-08

## 2018-11-09 MED ORDER — DURASORB UNDERPAD MISC
2.00 | Status: DC
Start: ? — End: 2018-11-09

## 2018-11-09 MED ORDER — DAYHIST-1 PO
15.00 | ORAL | Status: DC
Start: 2018-11-09 — End: 2018-11-09

## 2018-11-09 MED ORDER — ONDANSETRON 4 MG PO TBDP
4.00 | ORAL_TABLET | ORAL | Status: DC
Start: ? — End: 2018-11-09

## 2018-11-09 MED ORDER — Medication
10.00 | Status: DC
Start: ? — End: 2018-11-09

## 2018-11-10 ENCOUNTER — Other Ambulatory Visit: Payer: Medicare PPO

## 2018-11-10 ENCOUNTER — Ambulatory Visit: Payer: Medicare PPO | Admitting: Gynecologic Oncology

## 2018-11-12 MED ORDER — Medication
10.00 | Status: DC
Start: ? — End: 2018-11-12

## 2018-11-12 MED ORDER — Medication
5.00 | Status: DC
Start: 2018-11-11 — End: 2018-11-12

## 2018-11-12 MED ORDER — SM TRIPLE ANTIBIOTIC 5-400-5000 EX OINT
800.00 | TOPICAL_OINTMENT | CUTANEOUS | Status: DC
Start: ? — End: 2018-11-12

## 2018-11-12 MED ORDER — NALOXONE HCL 0.4 MG/ML IJ SOLN
0.40 | INTRAMUSCULAR | Status: DC
Start: ? — End: 2018-11-12

## 2018-11-12 MED ORDER — COLD RELIEF PLUS PO
750.00 | ORAL | Status: DC
Start: 2018-11-12 — End: 2018-11-12

## 2018-11-12 MED ORDER — Medication
Status: DC
Start: ? — End: 2018-11-12

## 2018-11-12 MED ORDER — DURASORB UNDERPAD MISC
1.00 | Status: DC
Start: ? — End: 2018-11-12

## 2018-12-07 DEATH — deceased

## 2020-02-15 IMAGING — DX DG CHEST 2V
2 series · 2 of 2 positions shown · non-contrast
Comparison: 01/21/2018

CLINICAL DATA: Hypertension, COPD. At risk for fluid overload.
Recent laparotomy.

EXAM:
CHEST - 2 VIEW

[chest lat]
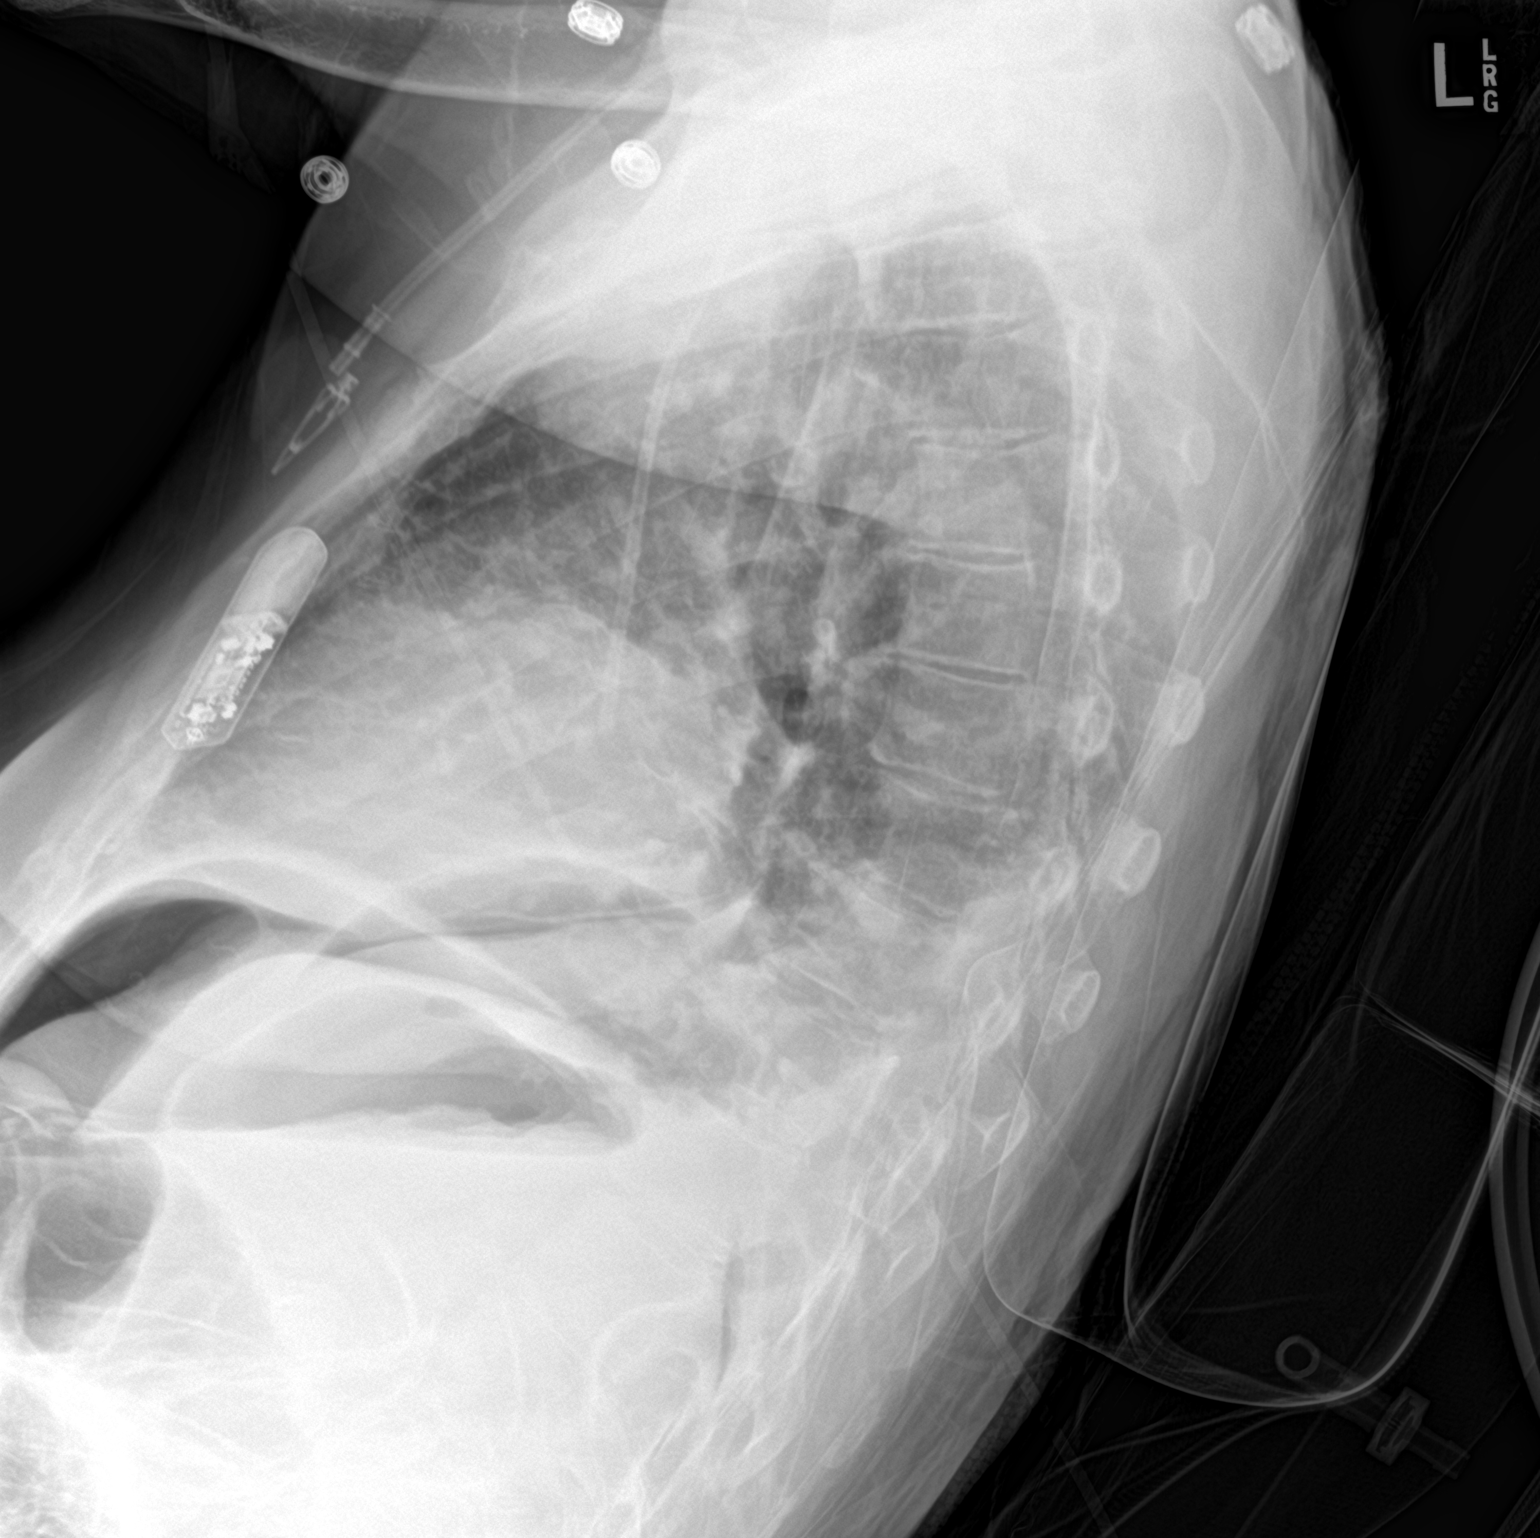

[chest ap]
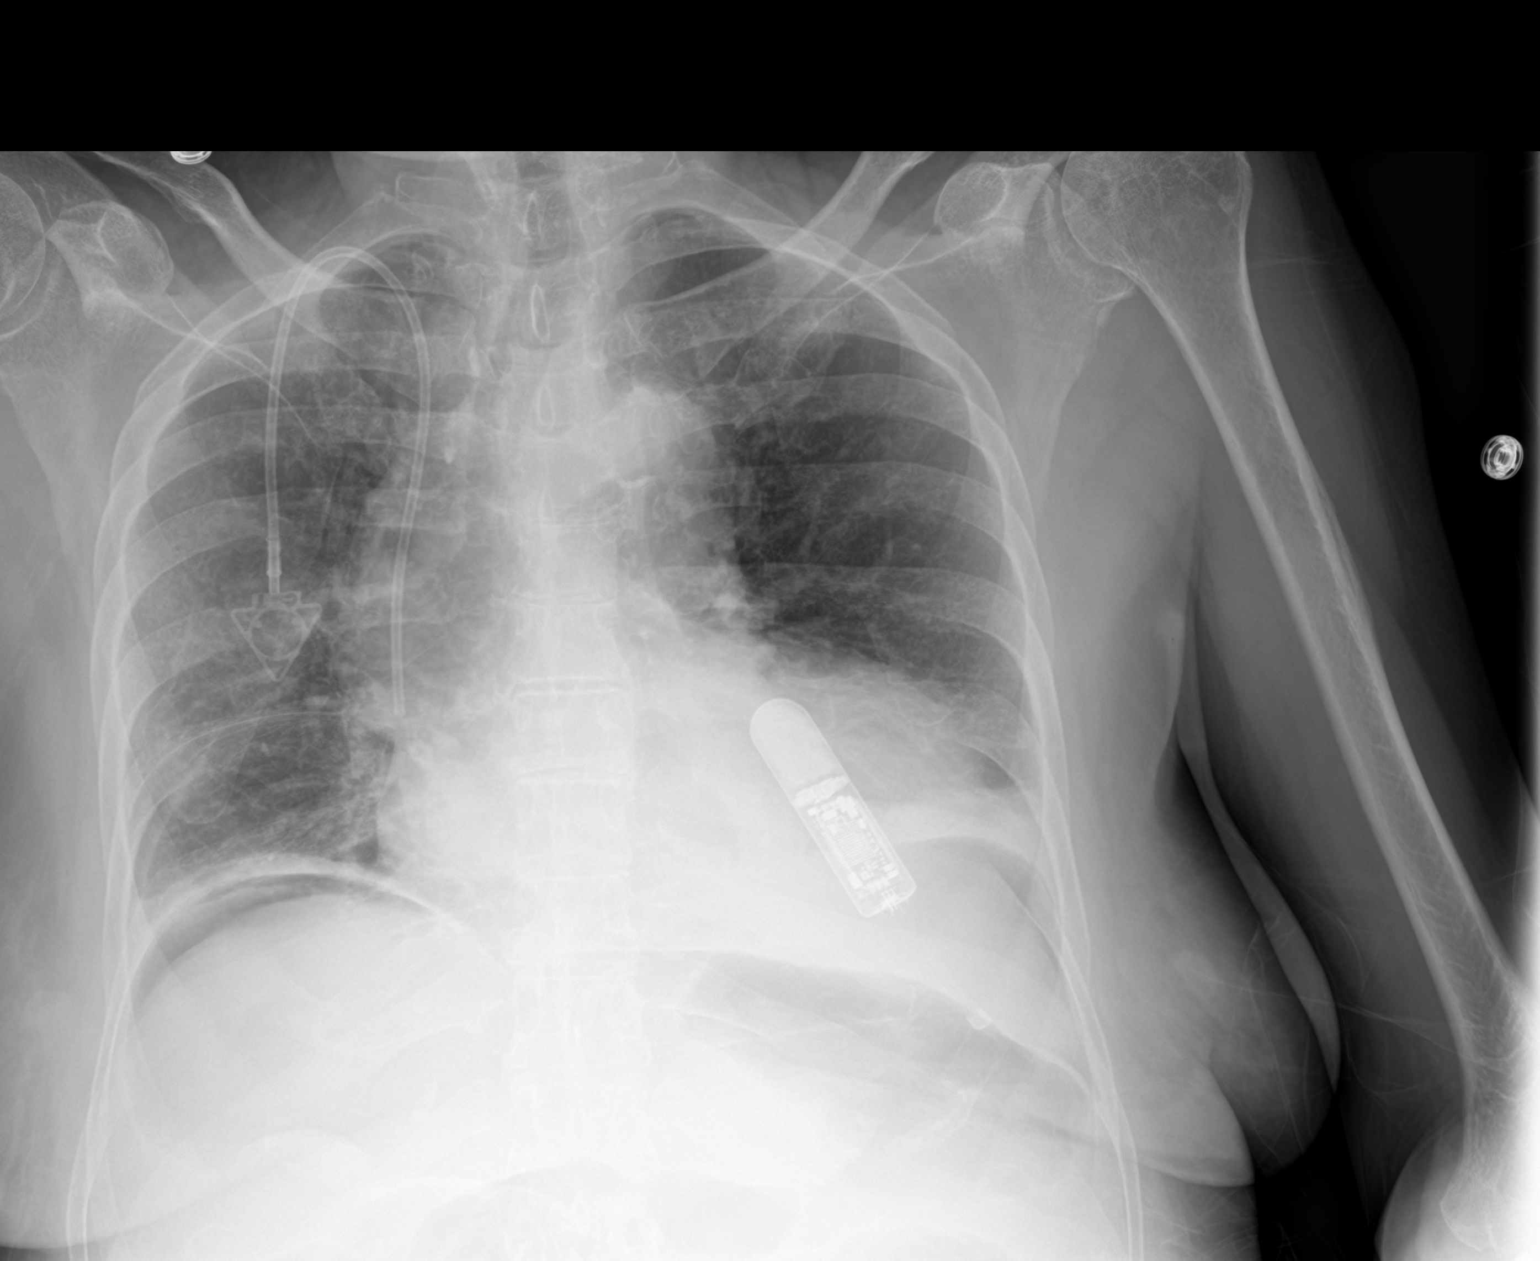

[2 of 2 positions shown; findings below may reference images not displayed]

FINDINGS: There is free air under the hemidiaphragms compatible with history
of recent laparotomy. Right Port-A-Cath remains in place, unchanged
as does the loop recorder device. Cardiomegaly with vascular
congestion. No overt edema. Bibasilar atelectasis.
IMPRESSION: Cardiomegaly, vascular congestion.  Bibasilar atelectasis.

Pneumoperitoneum, likely related to recent laparotomy.

## 2020-02-15 IMAGING — DX DG CHEST 1V PORT
1 series · 1 of 1 positions shown · non-contrast
Comparison: Chest radiographs earlier this day.

CLINICAL DATA: Dyspnea.  Laparotomy 2 days ago.

EXAM:
PORTABLE CHEST 1 VIEW

[chest ap]
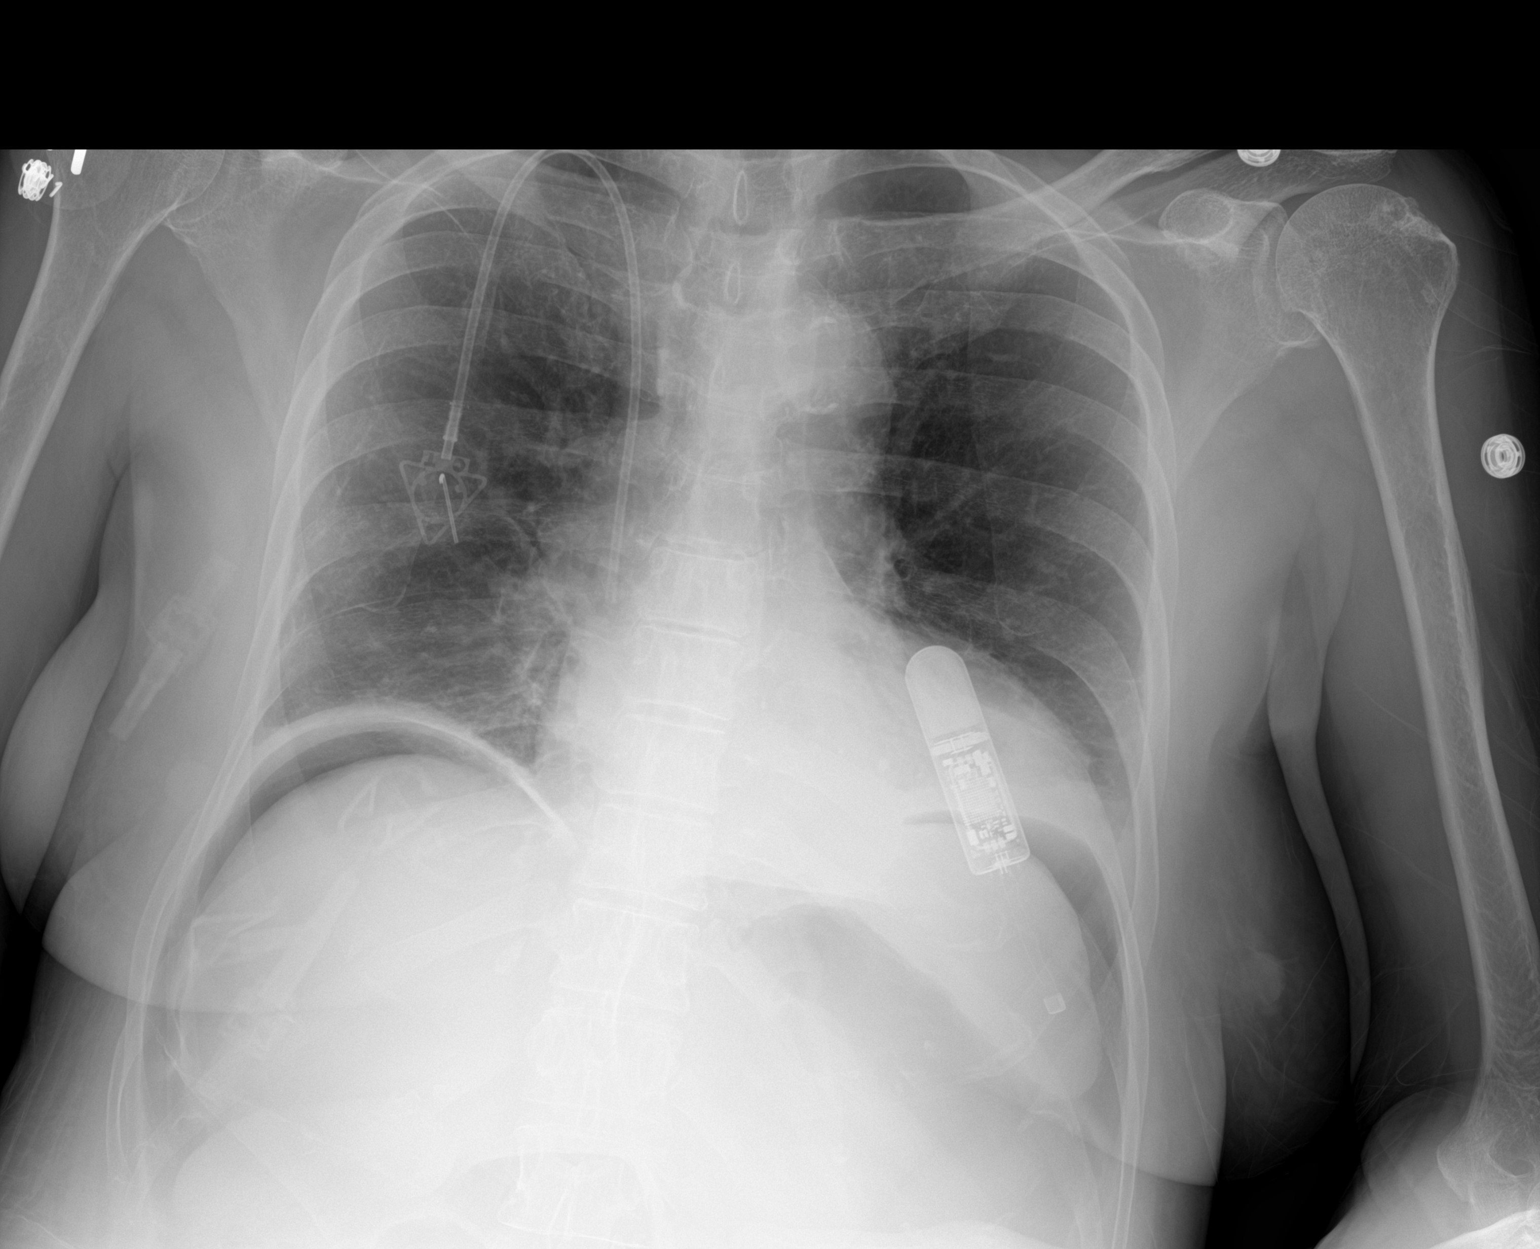

[1 of 1 positions shown; findings below may reference images not displayed]

FINDINGS: Tip of the right chest port in the mid SVC. Loop recorder projects
over the left chest wall. Low lung volumes with bibasilar opacities,
left greater than right. Possible left pleural effusion. Slight
improvement in cardiomegaly. Persistent but improving vascular
congestion. No pneumothorax. Again seen pneumoperitoneum, likely
related to recent laparotomy.
IMPRESSION: 1. Improving vascular congestion. Slight improvement in cardiomegaly
may be related to improved aeration.
2. Bibasilar opacities, left greater than right, likely atelectasis.
Possible small left pleural effusion.
3. Pneumoperitoneum again seen, likely related to recent laparotomy.

## 2020-02-17 IMAGING — DX DG ABDOMEN 1V
1 series · 1 of 1 positions shown · non-contrast
Comparison: CT of the abdomen pelvis dated 12/14/2017

CLINICAL DATA: 71-year-old female with abdominal pain.

EXAM:
ABDOMEN - 1 VIEW

[abdomen kub]
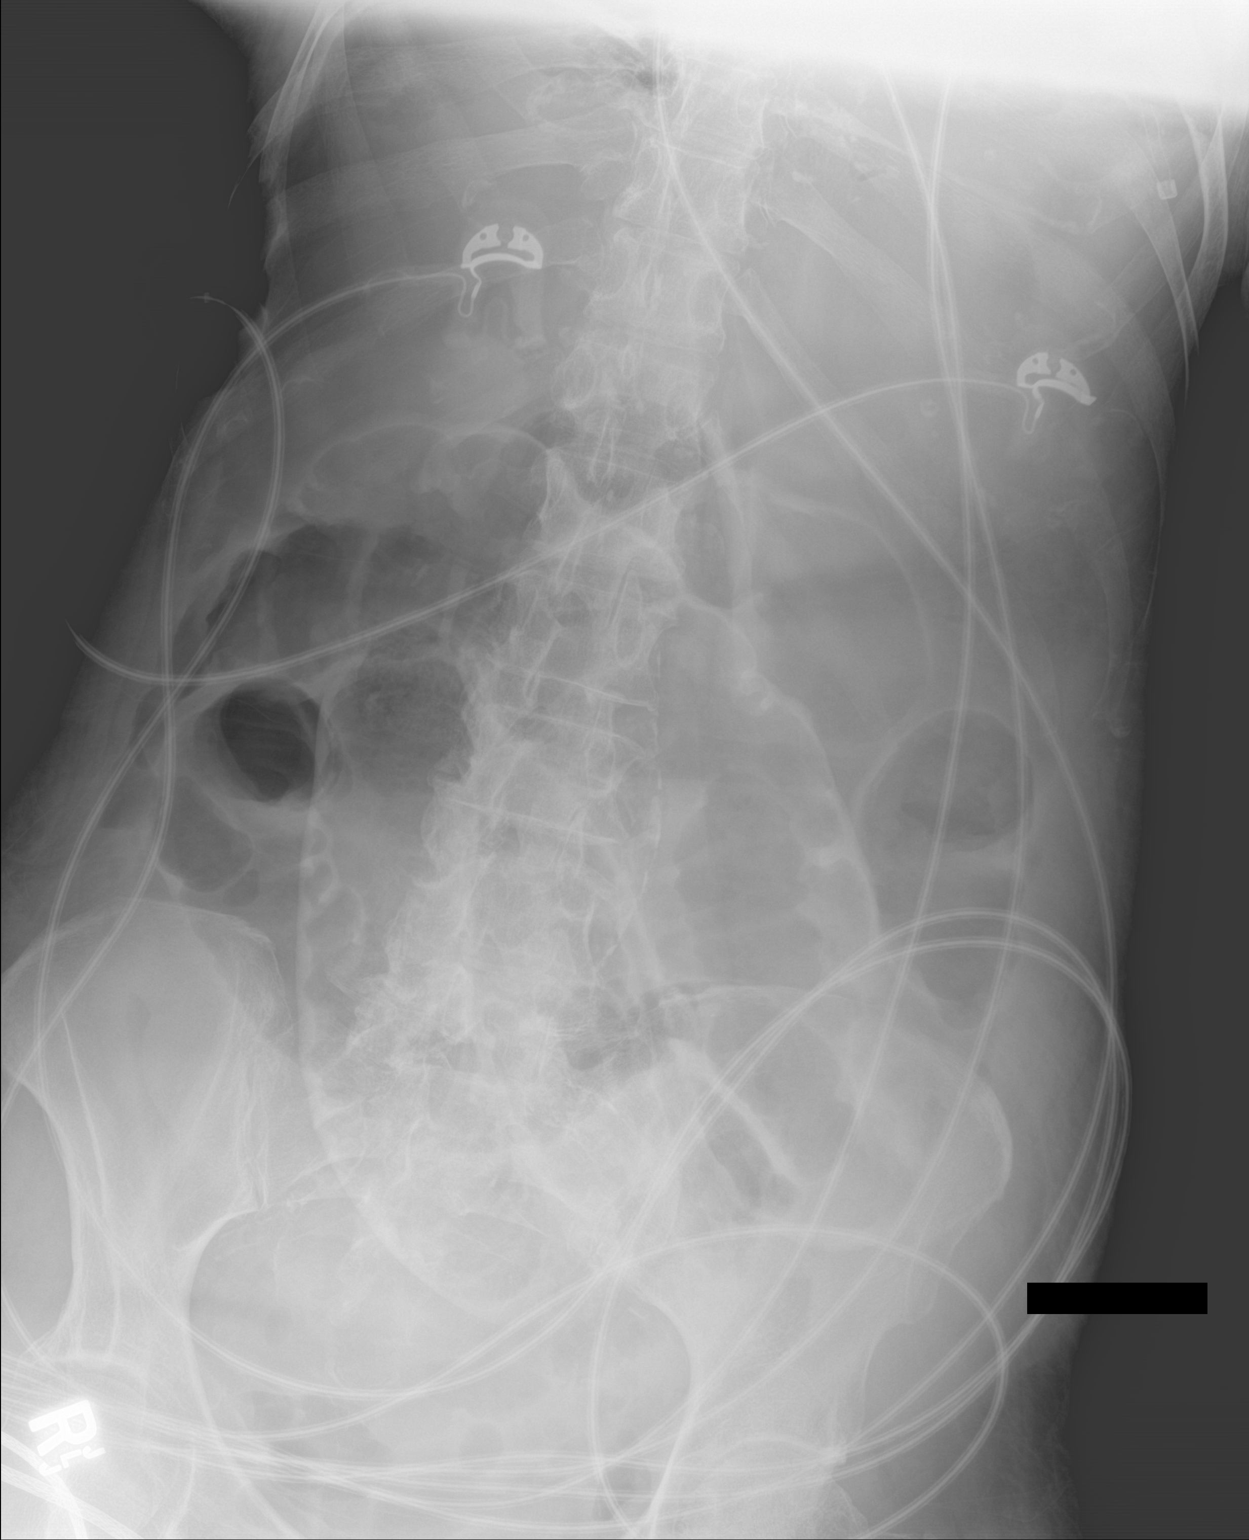

[1 of 1 positions shown; findings below may reference images not displayed]

FINDINGS: There is apparent air on both sides of the bowel loops (Loay
sign) concerning for pneumoperitoneum. Clinical correlation and
further evaluation with CT is recommended.

There is dilatation of the small bowel measure up to 5 cm. The
osseous structures are grossly unremarkable. There is
atherosclerotic calcification of the aorta. Degenerative changes of
the spine.
IMPRESSION: Findings concerning for pneumoperitoneum, possibly secondary to
small-bowel obstruction. Clinical correlation and further evaluation
with CT of the abdomen and pelvis recommended.

These results were called by telephone at the time of interpretation
on 01/31/2018 at [DATE] to nurse [REDACTED], who verbally acknowledged
these results.

## 2020-02-17 IMAGING — DX DG ABDOMEN 1V
1 series · 1 of 1 positions shown · non-contrast
Comparison: 01/31/2018

CLINICAL DATA: NG tube placement

EXAM:
ABDOMEN - 1 VIEW

[abdomen kub]
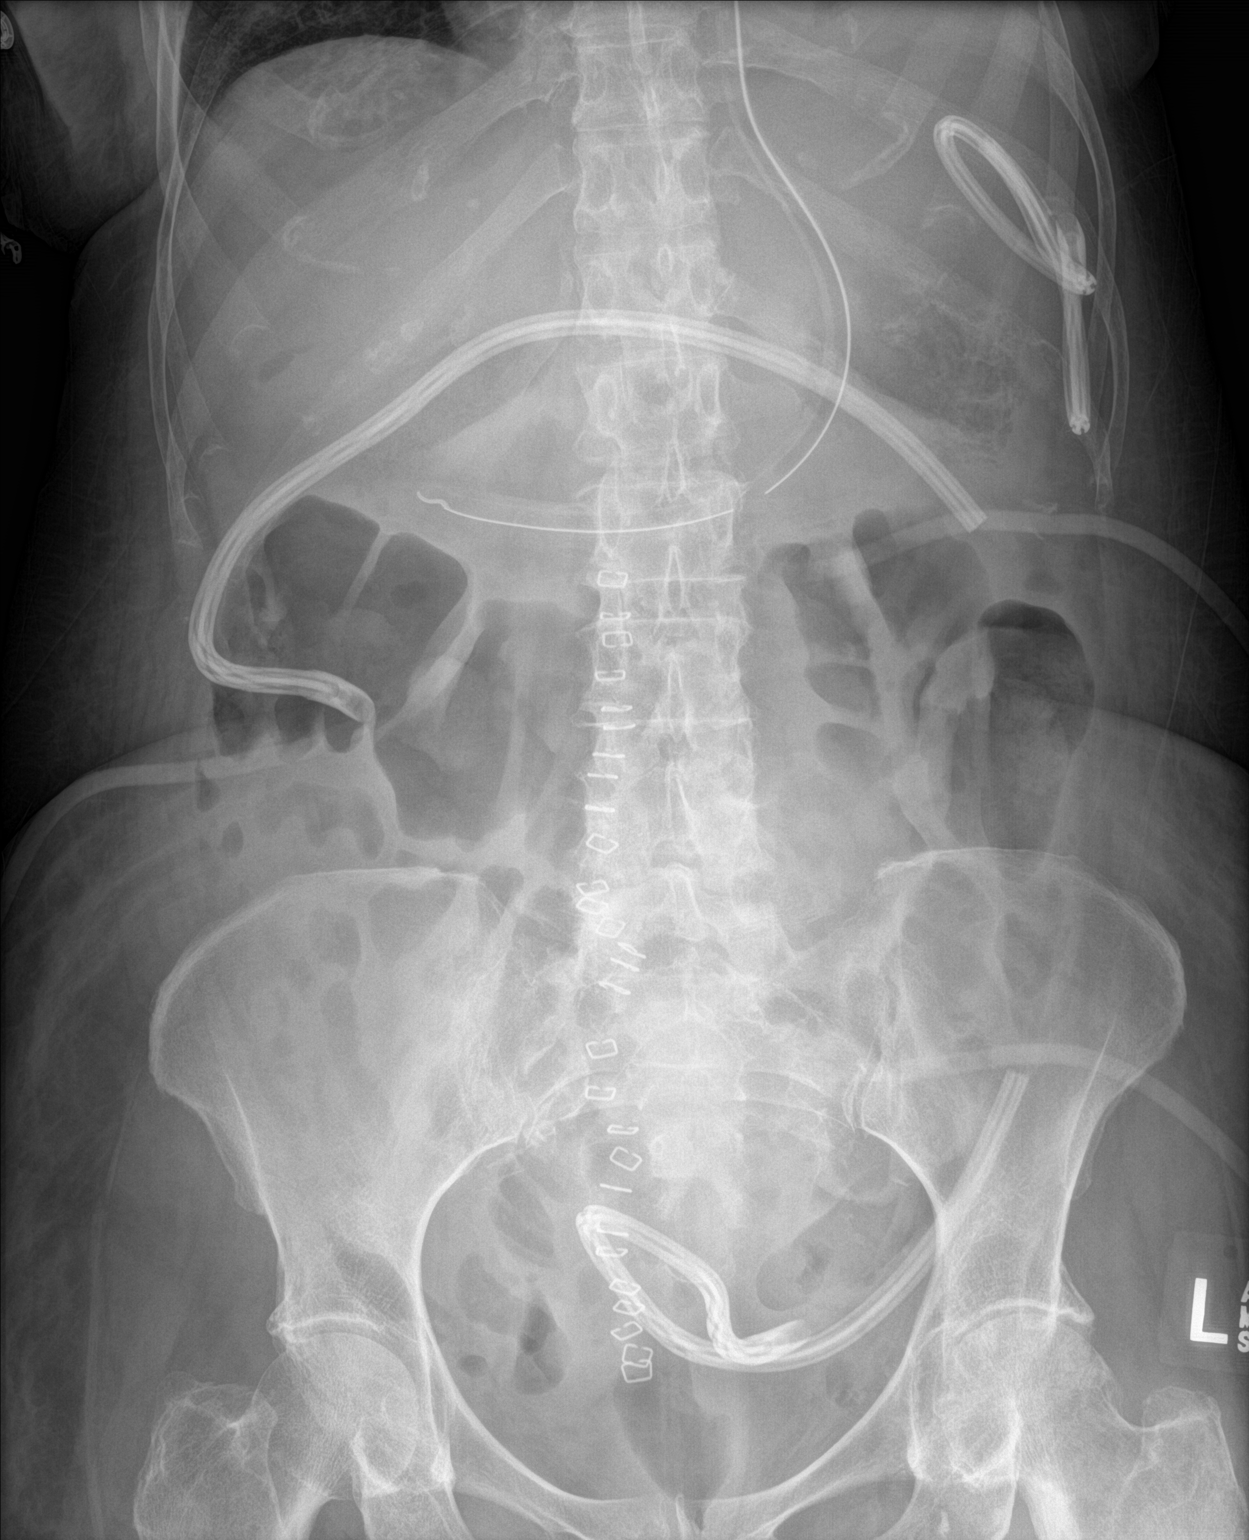

[1 of 1 positions shown; findings below may reference images not displayed]

FINDINGS: NG tube tip is in the distal stomach. Surgical drains project over
the abdomen and pelvis. Gas within mildly prominent large bowel. No
evidence of bowel obstruction.
IMPRESSION: NG tube tip in the distal stomach.
